# Patient Record
Sex: Male | Born: 1975 | Race: Asian | Hispanic: No | Marital: Single | Smoking: Former smoker
Health system: Southern US, Community
[De-identification: ages and names within clinical notes are randomized; demographics above are authoritative.]

## PROBLEM LIST (undated history)

## (undated) DIAGNOSIS — R0600 Dyspnea, unspecified: Secondary | ICD-10-CM

## (undated) DIAGNOSIS — B449 Aspergillosis, unspecified: Secondary | ICD-10-CM

## (undated) DIAGNOSIS — L814 Other melanin hyperpigmentation: Secondary | ICD-10-CM

## (undated) DIAGNOSIS — D649 Anemia, unspecified: Secondary | ICD-10-CM

## (undated) DIAGNOSIS — K219 Gastro-esophageal reflux disease without esophagitis: Secondary | ICD-10-CM

## (undated) DIAGNOSIS — J439 Emphysema, unspecified: Secondary | ICD-10-CM

## (undated) DIAGNOSIS — E039 Hypothyroidism, unspecified: Secondary | ICD-10-CM

## (undated) DIAGNOSIS — A310 Pulmonary mycobacterial infection: Secondary | ICD-10-CM

## (undated) DIAGNOSIS — R634 Abnormal weight loss: Principal | ICD-10-CM

## (undated) DIAGNOSIS — L568 Other specified acute skin changes due to ultraviolet radiation: Principal | ICD-10-CM

## (undated) DIAGNOSIS — L270 Generalized skin eruption due to drugs and medicaments taken internally: Secondary | ICD-10-CM

## (undated) DIAGNOSIS — Z77120 Contact with and (suspected) exposure to mold (toxic): Secondary | ICD-10-CM

## (undated) HISTORY — DX: Pulmonary mycobacterial infection: A31.0

## (undated) HISTORY — DX: Other specified acute skin changes due to ultraviolet radiation: L56.8

## (undated) HISTORY — DX: Aspergillosis, unspecified: B44.9

## (undated) HISTORY — DX: Hypothyroidism, unspecified: E03.9

## (undated) HISTORY — DX: Other melanin hyperpigmentation: L81.4

## (undated) HISTORY — DX: Abnormal weight loss: R63.4

## (undated) HISTORY — DX: Contact with and (suspected) exposure to mold (toxic): Z77.120

## (undated) HISTORY — DX: Generalized skin eruption due to drugs and medicaments taken internally: L27.0

## (undated) HISTORY — DX: Emphysema, unspecified: J43.9

## (undated) HISTORY — DX: Gastro-esophageal reflux disease without esophagitis: K21.9

---

## 2010-11-19 HISTORY — PX: OTHER SURGICAL HISTORY: SHX169

## 2010-12-16 ENCOUNTER — Emergency Department (INDEPENDENT_AMBULATORY_CARE_PROVIDER_SITE_OTHER)
Admission: EM | Admit: 2010-12-16 | Discharge: 2010-12-16 | Disposition: A | Discharge status: Other Acute Inpatient Hospital | Payer: BC Managed Care – PPO | Source: Home / Self Care | Admitting: Emergency Medicine

## 2010-12-16 ENCOUNTER — Inpatient Hospital Stay (HOSPITAL_COMMUNITY)
Admission: EM | Admit: 2010-12-16 | Discharge: 2010-12-27 | DRG: 415 | Disposition: A | Discharge status: Home / Self Care | Payer: BC Managed Care – PPO | Source: Other Acute Inpatient Hospital | Attending: Thoracic Surgery | Admitting: Thoracic Surgery

## 2010-12-16 DIAGNOSIS — D72829 Elevated white blood cell count, unspecified: Secondary | ICD-10-CM | POA: Diagnosis present

## 2010-12-16 DIAGNOSIS — Z5332 Thoracoscopic surgical procedure converted to open procedure: Secondary | ICD-10-CM

## 2010-12-16 DIAGNOSIS — Z23 Encounter for immunization: Secondary | ICD-10-CM

## 2010-12-16 DIAGNOSIS — J45909 Unspecified asthma, uncomplicated: Secondary | ICD-10-CM | POA: Diagnosis present

## 2010-12-16 DIAGNOSIS — B449 Aspergillosis, unspecified: Principal | ICD-10-CM | POA: Diagnosis present

## 2010-12-16 DIAGNOSIS — J439 Emphysema, unspecified: Secondary | ICD-10-CM | POA: Diagnosis present

## 2010-12-16 DIAGNOSIS — R042 Hemoptysis: Secondary | ICD-10-CM | POA: Diagnosis present

## 2010-12-16 DIAGNOSIS — K219 Gastro-esophageal reflux disease without esophagitis: Secondary | ICD-10-CM | POA: Diagnosis present

## 2010-12-16 DIAGNOSIS — D649 Anemia, unspecified: Secondary | ICD-10-CM | POA: Diagnosis present

## 2010-12-16 DIAGNOSIS — I959 Hypotension, unspecified: Secondary | ICD-10-CM | POA: Diagnosis not present

## 2010-12-16 DIAGNOSIS — Z79899 Other long term (current) drug therapy: Secondary | ICD-10-CM

## 2010-12-16 DIAGNOSIS — Z87891 Personal history of nicotine dependence: Secondary | ICD-10-CM

## 2010-12-16 DIAGNOSIS — E890 Postprocedural hypothyroidism: Secondary | ICD-10-CM | POA: Diagnosis present

## 2010-12-16 DIAGNOSIS — A310 Pulmonary mycobacterial infection: Secondary | ICD-10-CM | POA: Diagnosis present

## 2010-12-16 LAB — COMPREHENSIVE METABOLIC PANEL
ALT: 10 U/L (ref 0–53)
Alkaline Phosphatase: 146 U/L — ABNORMAL HIGH (ref 39–117)
BUN: 12 mg/dL (ref 6–23)
Chloride: 110 mEq/L (ref 96–112)
Glucose, Bld: 150 mg/dL — ABNORMAL HIGH (ref 70–99)
Potassium: 3.3 mEq/L — ABNORMAL LOW (ref 3.5–5.1)
Sodium: 141 mEq/L (ref 135–145)
Total Bilirubin: 0.7 mg/dL (ref 0.3–1.2)
Total Protein: 6.6 g/dL (ref 6.0–8.3)

## 2010-12-16 LAB — POCT I-STAT 3, ART BLOOD GAS (G3+)
O2 Saturation: 100 %
TCO2: 26 mmol/L (ref 0–100)
pCO2 arterial: 48.3 mmHg — ABNORMAL HIGH (ref 35.0–45.0)
pH, Arterial: 7.319 — ABNORMAL LOW (ref 7.350–7.450)
pO2, Arterial: 522 mmHg — ABNORMAL HIGH (ref 80.0–100.0)

## 2010-12-16 LAB — DIFFERENTIAL
Basophils Absolute: 0 10*3/uL (ref 0.0–0.1)
Eosinophils Relative: 1 % (ref 0–5)
Lymphocytes Relative: 9 % — ABNORMAL LOW (ref 12–46)
Monocytes Relative: 4 % (ref 3–12)
Neutrophils Relative %: 86 % — ABNORMAL HIGH (ref 43–77)

## 2010-12-16 LAB — CBC
HCT: 34 % — ABNORMAL LOW (ref 39.0–52.0)
Hemoglobin: 11.7 g/dL — ABNORMAL LOW (ref 13.0–17.0)
MCV: 82.3 fL (ref 78.0–100.0)
RBC: 4.13 MIL/uL — ABNORMAL LOW (ref 4.22–5.81)
WBC: 27.7 10*3/uL — ABNORMAL HIGH (ref 4.0–10.5)

## 2010-12-16 LAB — GLUCOSE, CAPILLARY: Glucose-Capillary: 111 mg/dL — ABNORMAL HIGH (ref 70–99)

## 2010-12-16 LAB — PROCALCITONIN: Procalcitonin: 0.26 ng/mL

## 2010-12-16 LAB — PROTIME-INR
INR: 1.13 (ref 0.00–1.49)
Prothrombin Time: 14.7 seconds (ref 11.6–15.2)

## 2010-12-16 LAB — POCT CARDIAC MARKERS
CKMB, poc: 1.4 ng/mL (ref 1.0–8.0)
Myoglobin, poc: 154 ng/mL (ref 12–200)

## 2010-12-17 LAB — URINALYSIS, ROUTINE W REFLEX MICROSCOPIC
Bilirubin Urine: NEGATIVE
Hgb urine dipstick: NEGATIVE
Ketones, ur: NEGATIVE mg/dL
Specific Gravity, Urine: 1.014 (ref 1.005–1.030)
Urobilinogen, UA: 1 mg/dL (ref 0.0–1.0)

## 2010-12-17 LAB — CARDIAC PANEL(CRET KIN+CKTOT+MB+TROPI)
CK, MB: 3.3 ng/mL (ref 0.3–4.0)
CK, MB: 3 ng/mL (ref 0.3–4.0)
Relative Index: 2.3 (ref 0.0–2.5)
Troponin I: 0.06 ng/mL (ref 0.00–0.06)
Troponin I: 0.03 ng/mL (ref 0.00–0.06)

## 2010-12-17 LAB — MRSA PCR SCREENING: MRSA by PCR: NEGATIVE

## 2010-12-17 LAB — DIFFERENTIAL
Basophils Absolute: 0 10*3/uL (ref 0.0–0.1)
Basophils Relative: 0 % (ref 0–1)
Eosinophils Absolute: 0.1 10*3/uL (ref 0.0–0.7)
Eosinophils Relative: 1 % (ref 0–5)
Monocytes Absolute: 0.7 10*3/uL (ref 0.1–1.0)

## 2010-12-17 LAB — CBC
HCT: 29.2 % — ABNORMAL LOW (ref 39.0–52.0)
MCHC: 32.9 g/dL (ref 30.0–36.0)
Platelets: 248 10*3/uL (ref 150–400)
RDW: 14.6 % (ref 11.5–15.5)
WBC: 11.1 10*3/uL — ABNORMAL HIGH (ref 4.0–10.5)

## 2010-12-17 LAB — POCT I-STAT 3, ART BLOOD GAS (G3+)
Acid-base deficit: 2 mmol/L (ref 0.0–2.0)
O2 Saturation: 96 %
Patient temperature: 97.7

## 2010-12-17 LAB — CORTISOL: Cortisol, Plasma: 14.1 ug/dL

## 2010-12-17 LAB — RETICULOCYTES: Retic Count, Absolute: 71.6 10*3/uL (ref 19.0–186.0)

## 2010-12-17 LAB — BASIC METABOLIC PANEL
Calcium: 7.5 mg/dL — ABNORMAL LOW (ref 8.4–10.5)
GFR calc Af Amer: >60 mL/min (ref >60)
GFR calc non Af Amer: >60 mL/min (ref >60)
Glucose, Bld: 94 mg/dL (ref 70–99)
Sodium: 138 mEq/L (ref 135–145)

## 2010-12-17 LAB — GLUCOSE, CAPILLARY: Glucose-Capillary: 101 mg/dL — ABNORMAL HIGH (ref 70–99)

## 2010-12-17 LAB — LEGIONELLA ANTIGEN, URINE: Legionella Antigen, Urine: NEGATIVE

## 2010-12-17 LAB — IRON AND TIBC
Iron: 44 ug/dL (ref 42–135)
TIBC: 162 ug/dL — ABNORMAL LOW (ref 215–435)

## 2010-12-17 LAB — EXPECTORATED SPUTUM ASSESSMENT W GRAM STAIN, RFLX TO RESP C

## 2010-12-17 LAB — BRAIN NATRIURETIC PEPTIDE: Pro B Natriuretic peptide (BNP): 79 pg/mL (ref 0.0–100.0)

## 2010-12-18 DIAGNOSIS — A318 Other mycobacterial infections: Secondary | ICD-10-CM

## 2010-12-18 DIAGNOSIS — J984 Other disorders of lung: Secondary | ICD-10-CM

## 2010-12-18 LAB — PHOSPHORUS: Phosphorus: 2.8 mg/dL (ref 2.3–4.6)

## 2010-12-18 LAB — FOLATE: Folate: 5.4 ng/mL

## 2010-12-18 LAB — CBC
HCT: 31.9 % — ABNORMAL LOW (ref 39.0–52.0)
Hemoglobin: 10.6 g/dL — ABNORMAL LOW (ref 13.0–17.0)
MCHC: 33.2 g/dL (ref 30.0–36.0)
RBC: 3.8 MIL/uL — ABNORMAL LOW (ref 4.22–5.81)
WBC: 9.4 10*3/uL (ref 4.0–10.5)

## 2010-12-18 LAB — VITAMIN B12: Vitamin B-12: 493 pg/mL (ref 211–911)

## 2010-12-18 LAB — DIFFERENTIAL
Basophils Absolute: 0 10*3/uL (ref 0.0–0.1)
Basophils Relative: 0 % (ref 0–1)
Lymphocytes Relative: 12 % (ref 12–46)
Neutro Abs: 7.4 10*3/uL (ref 1.7–7.7)
Neutrophils Relative %: 78 % — ABNORMAL HIGH (ref 43–77)

## 2010-12-18 LAB — URINE CULTURE

## 2010-12-18 LAB — BASIC METABOLIC PANEL
CO2: 26 mEq/L (ref 19–32)
Calcium: 8 mg/dL — ABNORMAL LOW (ref 8.4–10.5)
GFR calc Af Amer: >60 mL/min (ref >60)
Glucose, Bld: 96 mg/dL (ref 70–99)
Potassium: 3.8 mEq/L (ref 3.5–5.1)
Sodium: 138 mEq/L (ref 135–145)

## 2010-12-18 LAB — TSH: TSH: 0.021 u[IU]/mL — ABNORMAL LOW (ref 0.350–4.500)

## 2010-12-19 ENCOUNTER — Encounter: Payer: Self-pay | Admitting: Internal Medicine

## 2010-12-19 DIAGNOSIS — R042 Hemoptysis: Secondary | ICD-10-CM

## 2010-12-19 DIAGNOSIS — J984 Other disorders of lung: Secondary | ICD-10-CM

## 2010-12-19 DIAGNOSIS — A318 Other mycobacterial infections: Secondary | ICD-10-CM

## 2010-12-19 LAB — BASIC METABOLIC PANEL
BUN: 4 mg/dL — ABNORMAL LOW (ref 6–23)
Calcium: 7.9 mg/dL — ABNORMAL LOW (ref 8.4–10.5)
Creatinine, Ser: 0.82 mg/dL (ref 0.4–1.5)
GFR calc non Af Amer: >60 mL/min (ref >60)
Glucose, Bld: 100 mg/dL — ABNORMAL HIGH (ref 70–99)

## 2010-12-19 LAB — CBC
HCT: 31 % — ABNORMAL LOW (ref 39.0–52.0)
MCH: 28.1 pg (ref 26.0–34.0)
MCHC: 33.5 g/dL (ref 30.0–36.0)
MCV: 83.8 fL (ref 78.0–100.0)
RDW: 14.8 % (ref 11.5–15.5)

## 2010-12-19 LAB — CULTURE, RESPIRATORY W GRAM STAIN

## 2010-12-20 DIAGNOSIS — B449 Aspergillosis, unspecified: Secondary | ICD-10-CM

## 2010-12-20 DIAGNOSIS — A310 Pulmonary mycobacterial infection: Secondary | ICD-10-CM

## 2010-12-20 DIAGNOSIS — R042 Hemoptysis: Secondary | ICD-10-CM

## 2010-12-20 DIAGNOSIS — J449 Chronic obstructive pulmonary disease, unspecified: Secondary | ICD-10-CM

## 2010-12-20 LAB — BASIC METABOLIC PANEL
CO2: 25 mEq/L (ref 19–32)
Calcium: 8.3 mg/dL — ABNORMAL LOW (ref 8.4–10.5)
Creatinine, Ser: 0.76 mg/dL (ref 0.4–1.5)
GFR calc Af Amer: >60 mL/min (ref >60)
GFR calc non Af Amer: >60 mL/min (ref >60)
Glucose, Bld: 96 mg/dL (ref 70–99)

## 2010-12-20 LAB — CBC
MCH: 28 pg (ref 26.0–34.0)
MCHC: 33.2 g/dL (ref 30.0–36.0)
RDW: 15.2 % (ref 11.5–15.5)

## 2010-12-21 ENCOUNTER — Inpatient Hospital Stay (HOSPITAL_COMMUNITY): Payer: BC Managed Care – PPO

## 2010-12-21 LAB — COMPREHENSIVE METABOLIC PANEL
Albumin: 2.7 g/dL — ABNORMAL LOW (ref 3.5–5.2)
Alkaline Phosphatase: 89 U/L (ref 39–117)
BUN: 4 mg/dL — ABNORMAL LOW (ref 6–23)
GFR calc Af Amer: >60 mL/min (ref >60)
Potassium: 4.2 mEq/L (ref 3.5–5.1)
Total Protein: 7.4 g/dL (ref 6.0–8.3)

## 2010-12-21 LAB — APTT: aPTT: 41 seconds — ABNORMAL HIGH (ref 24–37)

## 2010-12-21 LAB — ABO/RH: ABO/RH(D): B POS

## 2010-12-21 LAB — CBC
MCH: 28.3 pg (ref 26.0–34.0)
Platelets: 348 10*3/uL (ref 150–400)
RBC: 4.03 MIL/uL — ABNORMAL LOW (ref 4.22–5.81)
WBC: 6.9 10*3/uL (ref 4.0–10.5)

## 2010-12-21 LAB — PROTIME-INR: INR: 1 (ref 0.00–1.49)

## 2010-12-22 ENCOUNTER — Inpatient Hospital Stay (HOSPITAL_COMMUNITY): Payer: BC Managed Care – PPO

## 2010-12-22 DIAGNOSIS — B449 Aspergillosis, unspecified: Secondary | ICD-10-CM

## 2010-12-22 LAB — CULTURE, BLOOD (ROUTINE X 2)
Culture  Setup Time: 201201282226
Culture: NO GROWTH

## 2010-12-22 LAB — BASIC METABOLIC PANEL
CO2: 26 mEq/L (ref 19–32)
Chloride: 107 mEq/L (ref 96–112)
GFR calc Af Amer: >60 mL/min (ref >60)
Sodium: 141 mEq/L (ref 135–145)

## 2010-12-22 LAB — CBC
Hemoglobin: 10.8 g/dL — ABNORMAL LOW (ref 13.0–17.0)
RBC: 3.89 MIL/uL — ABNORMAL LOW (ref 4.22–5.81)

## 2010-12-23 ENCOUNTER — Inpatient Hospital Stay (HOSPITAL_COMMUNITY): Payer: BC Managed Care – PPO

## 2010-12-23 LAB — POCT I-STAT 3, ART BLOOD GAS (G3+)
Bicarbonate: 28.5 mEq/L — ABNORMAL HIGH (ref 20.0–24.0)
TCO2: 30 mmol/L (ref 0–100)
pCO2 arterial: 46.6 mmHg — ABNORMAL HIGH (ref 35.0–45.0)
pH, Arterial: 7.393 (ref 7.350–7.450)

## 2010-12-23 LAB — CULTURE, BLOOD (SINGLE): Culture  Setup Time: 201201291712

## 2010-12-23 LAB — CBC
HCT: 30.9 % — ABNORMAL LOW (ref 39.0–52.0)
MCV: 84 fL (ref 78.0–100.0)
Platelets: 249 10*3/uL (ref 150–400)
RBC: 3.68 MIL/uL — ABNORMAL LOW (ref 4.22–5.81)
WBC: 11.6 10*3/uL — ABNORMAL HIGH (ref 4.0–10.5)

## 2010-12-23 LAB — CULTURE, BLOOD (ROUTINE X 2)
Culture  Setup Time: 201201291151
Culture: NO GROWTH
Culture: NO GROWTH

## 2010-12-23 LAB — BASIC METABOLIC PANEL
BUN: 4 mg/dL — ABNORMAL LOW (ref 6–23)
Chloride: 100 mEq/L (ref 96–112)
Glucose, Bld: 151 mg/dL — ABNORMAL HIGH (ref 70–99)
Potassium: 3.9 mEq/L (ref 3.5–5.1)

## 2010-12-24 ENCOUNTER — Inpatient Hospital Stay (HOSPITAL_COMMUNITY): Payer: BC Managed Care – PPO

## 2010-12-24 LAB — CROSSMATCH
Unit division: 0
Unit division: 0

## 2010-12-24 LAB — COMPREHENSIVE METABOLIC PANEL
Albumin: 2.2 g/dL — ABNORMAL LOW (ref 3.5–5.2)
BUN: 5 mg/dL — ABNORMAL LOW (ref 6–23)
Calcium: 8.1 mg/dL — ABNORMAL LOW (ref 8.4–10.5)
Creatinine, Ser: 0.78 mg/dL (ref 0.4–1.5)
Potassium: 4 mEq/L (ref 3.5–5.1)
Total Protein: 5.9 g/dL — ABNORMAL LOW (ref 6.0–8.3)

## 2010-12-24 LAB — CBC
HCT: 31.6 % — ABNORMAL LOW (ref 39.0–52.0)
MCH: 27.6 pg (ref 26.0–34.0)
MCV: 84.7 fL (ref 78.0–100.0)
Platelets: 249 10*3/uL (ref 150–400)
RBC: 3.73 MIL/uL — ABNORMAL LOW (ref 4.22–5.81)

## 2010-12-25 ENCOUNTER — Other Ambulatory Visit: Payer: Self-pay | Admitting: Thoracic Surgery

## 2010-12-25 ENCOUNTER — Inpatient Hospital Stay (HOSPITAL_COMMUNITY): Payer: BC Managed Care – PPO

## 2010-12-25 LAB — BASIC METABOLIC PANEL
Calcium: 8 mg/dL — ABNORMAL LOW (ref 8.4–10.5)
GFR calc non Af Amer: >60 mL/min (ref >60)
Potassium: 3.6 mEq/L (ref 3.5–5.1)
Sodium: 138 mEq/L (ref 135–145)

## 2010-12-25 LAB — POCT I-STAT 7, (LYTES, BLD GAS, ICA,H+H)
Acid-base deficit: 2 mmol/L (ref 0.0–2.0)
Calcium, Ion: 1.07 mmol/L — ABNORMAL LOW (ref 1.12–1.32)
HCT: 34 % — ABNORMAL LOW (ref 39.0–52.0)
Potassium: 4.4 mEq/L (ref 3.5–5.1)
pCO2 arterial: 52.6 mmHg — ABNORMAL HIGH (ref 35.0–45.0)
pO2, Arterial: 106 mmHg — ABNORMAL HIGH (ref 80.0–100.0)

## 2010-12-25 LAB — CBC
MCHC: 33.2 g/dL (ref 30.0–36.0)
Platelets: 239 10*3/uL (ref 150–400)
RDW: 14.9 % (ref 11.5–15.5)
WBC: 8.9 10*3/uL (ref 4.0–10.5)

## 2010-12-25 LAB — TISSUE CULTURE

## 2010-12-25 LAB — MRSA PCR SCREENING: MRSA by PCR: NEGATIVE

## 2010-12-26 ENCOUNTER — Inpatient Hospital Stay (HOSPITAL_COMMUNITY): Payer: BC Managed Care – PPO

## 2010-12-26 DIAGNOSIS — J984 Other disorders of lung: Secondary | ICD-10-CM

## 2010-12-26 DIAGNOSIS — A318 Other mycobacterial infections: Secondary | ICD-10-CM

## 2010-12-27 ENCOUNTER — Telehealth: Payer: Self-pay | Admitting: Internal Medicine

## 2010-12-27 ENCOUNTER — Inpatient Hospital Stay (HOSPITAL_COMMUNITY): Payer: BC Managed Care – PPO

## 2010-12-27 LAB — BASIC METABOLIC PANEL
BUN: 5 mg/dL — ABNORMAL LOW (ref 6–23)
CO2: 27 mEq/L (ref 19–32)
Calcium: 8.2 mg/dL — ABNORMAL LOW (ref 8.4–10.5)
Creatinine, Ser: 0.73 mg/dL (ref 0.4–1.5)
GFR calc non Af Amer: >60 mL/min (ref >60)
Glucose, Bld: 97 mg/dL (ref 70–99)
Sodium: 139 mEq/L (ref 135–145)

## 2010-12-27 LAB — CBC
HCT: 30.1 % — ABNORMAL LOW (ref 39.0–52.0)
Hemoglobin: 9.9 g/dL — ABNORMAL LOW (ref 13.0–17.0)
MCH: 28.1 pg (ref 26.0–34.0)
MCHC: 32.9 g/dL (ref 30.0–36.0)
RDW: 14.9 % (ref 11.5–15.5)

## 2010-12-28 ENCOUNTER — Telehealth (INDEPENDENT_AMBULATORY_CARE_PROVIDER_SITE_OTHER): Payer: Self-pay | Admitting: *Deleted

## 2010-12-28 ENCOUNTER — Telehealth: Payer: Self-pay | Admitting: Internal Medicine

## 2010-12-28 DIAGNOSIS — A310 Pulmonary mycobacterial infection: Secondary | ICD-10-CM | POA: Insufficient documentation

## 2010-12-29 ENCOUNTER — Telehealth (INDEPENDENT_AMBULATORY_CARE_PROVIDER_SITE_OTHER): Payer: Self-pay | Admitting: *Deleted

## 2010-12-29 ENCOUNTER — Encounter: Payer: Self-pay | Admitting: Infectious Disease

## 2010-12-29 DIAGNOSIS — A318 Other mycobacterial infections: Secondary | ICD-10-CM

## 2010-12-29 DIAGNOSIS — J439 Emphysema, unspecified: Secondary | ICD-10-CM

## 2010-12-29 DIAGNOSIS — B449 Aspergillosis, unspecified: Secondary | ICD-10-CM

## 2011-01-02 ENCOUNTER — Other Ambulatory Visit: Payer: Self-pay | Admitting: Thoracic Surgery

## 2011-01-02 DIAGNOSIS — D381 Neoplasm of uncertain behavior of trachea, bronchus and lung: Secondary | ICD-10-CM

## 2011-01-03 ENCOUNTER — Ambulatory Visit (INDEPENDENT_AMBULATORY_CARE_PROVIDER_SITE_OTHER): Payer: BC Managed Care – PPO | Admitting: Thoracic Surgery

## 2011-01-03 ENCOUNTER — Ambulatory Visit
Admission: RE | Admit: 2011-01-03 | Discharge: 2011-01-03 | Disposition: A | Discharge status: Home / Self Care | Payer: BC Managed Care – PPO | Source: Ambulatory Visit | Attending: Thoracic Surgery | Admitting: Thoracic Surgery

## 2011-01-03 DIAGNOSIS — D381 Neoplasm of uncertain behavior of trachea, bronchus and lung: Secondary | ICD-10-CM

## 2011-01-03 DIAGNOSIS — B449 Aspergillosis, unspecified: Secondary | ICD-10-CM

## 2011-01-03 NOTE — Assessment & Plan Note (Signed)
OFFICE VISIT  [name], [name] DOB:  21-Nov-1975                                        January 03, 2011 CHART #:  [id number]  The patient returns today.  His blood pressure is 118/79, pulse 100, respirations 20 and sats were 95%.  Chest x-ray showed normal postoperative changes.  There is an apical space which we would have expected.  He has finally gotten all his pain.  We gave him a refill for Percocet #20 and he finally got his antifungal medications.  I planned to see him back again in 3 weeks with a chest x-ray.  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  He has had no more hemoptysis.  Nicanor Alcon, M.D. Electronically Signed  DPB/MEDQ  D:  01/03/2011  T:  01/03/2011  Job:  [id number]

## 2011-01-04 NOTE — Progress Notes (Addendum)
Summary: ID appt  Phone Note Other Incoming Call back at [phone]   Caller: jolene carter Summary of Call: Wants to speak with nurse in ref to this pt says she spoke with Janett Billow R. earlier. Initial call taken by: Netta Neat,  December 28, 2010 4:26 PM  Follow-up for Phone Call        Jolene informed that MR spoke with Dr Megan Salon (ID) personaly and an appt will be set for this pt ASAP. Pt is to continue on all other meds until seen with ID. Francesca Jewett [facility]  December 28, 2010 4:50 PM     Appended Document: Orders Update I have arranged appt with myself in ID. THe patient's dc was not handles properly as he was dc tohome but his insurance will not pay for her voriconazole despite the fact that they just payed for resection of his aspergilloma. 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) Added new problem of ASPERGILLOSIS (ICD-117.3) Medications: Added new medication of VORICONAZOLE 200 MG TABS (VORICONAZOLE) take one and ah have tablets twice daily and then one tablet twice daily - Signed Rx of VORICONAZOLE 200 MG TABS (VORICONAZOLE) take one and ah have tablets twice daily and then one tablet twice daily;  #61 x 11;  Signed;  Entered by: Alcide Evener MD;  Authorized by: Rhina Brackett Dam MD;  Method used: Print then Give to Patient    Prescriptions: VORICONAZOLE 200 MG TABS (VORICONAZOLE) take one and ah have tablets twice daily and then one tablet twice daily  #61 x 11   Entered and Authorized by:   Alcide Evener MD   Signed by:   Rhina Brackett Dam MD on 12/28/2010   Method used:   Print then Give to Patient   RxID:   [id number]

## 2011-01-04 NOTE — Progress Notes (Signed)
Summary: needs fu appt  Phone Note Outgoing Call   Summary of Call: [name], Patient is being discharged today by DR Arlyce Dice. He has fu appt with Dr Elsworth Soho but because I wsa originally involved with his care he has agreed to swtich to me for fu. Dr. Elsworth Soho is oka with this. Please call (currently in [phone] at [facility]) and change fu to me at [facility] Initial call taken by: Brand Males MD,  December 27, 2010 1:19 PM  Follow-up for Phone Call        pt set to see MR 01-16-11. OV with RA cancelled. [place] Bing CMA  December 28, 2010 1:18 PM

## 2011-01-04 NOTE — Op Note (Signed)
NAMECOLEN, [name]                   ACCOUNT NO.:  [id number]  MEDICAL RECORD NO.:  [id number]           PATIENT TYPE:  I  LOCATION:  2309                         FACILITY:  [facility]  PHYSICIAN:  Nicanor Alcon, M.D. 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  OPERATION PERFORMED:  Left VATS, left thoracotomy, and resection of the apical posterior segment left upper lobe with resection of mycetoma.  SURGEON:  Nicanor Alcon, M.D.  FIRST ASSISTANT:  Gilford Raid, M.D.  ANESTHESIA:  General anesthesia.  INDICATIONS:  This 35 year old [place] had Mycobacterium avium intracellulare with cavitary lesions in his left upper lobe and his right upper lobe.  In the left upper lobe, there was a large mycetoma which was thought to be an aspergilloma.  He had recurrent hemoptysis. He was brought to the operating room for resection.  Risks of procedure were explained to the patient.  The patient understand the risks.  PROCEDURE IN DETAIL:  He was turned to the left lateral thoracotomy position and was prepped and draped in the usual sterile manner.  The dual-lumen tube was inserted and the left lung was deflated.  Two trocar sites were made in the anterior and posterior axillary line at the seventh intercostal space.  Two trocars were inserted and the left lower lobe was fairly free, and after getting the trocars in at the superior segment of the left lower lobe and starting at the fissure and going superiorly, it was stuck with very dense adhesions such that we could just take a few of them down with electrocautery and  had to end up converting to an open thoracotomy.  A posterolateral thoracotomy incision was made in the fifth intercostal space.  Latissimus was divided.  The serratus was reflected anteriorly.  A portion of the rhomboid was evaluated posteriorly.  The fifth intercostal space was entered, and then we started taking down adhesions superiorly and medially.  Medially, there were some adhesions, but they came down easily.  Posteriorly and superiorly, there were very dense adhesions where the mycetoma was.  We started dissecting out the lateral wall superiorly and then medially.  We were able to get a plane immediately and dissected down medially and decided to go ahead and open the mycetoma, because it was so dense, there was no way we could not do that; so we opened the mycetoma, cleaned it out, sent it for culture and then with electrocautery, we took down the mycetoma superiorly and then identified the aorta and took the left upper lobe off the aorta and then laterally off the chest wall.  We also had to take a superior segment off the chest wall, dissecting it down.  Finally, we were able to free up the left lower lobe and the lingular portion of the superior segment. The apical posterior segment had pretty much  been dissected out, and then we resected that using the sutures.  Using the 45 purple stapler with multiple applications were required to dissect both the medial and lateral wall of the mycetoma and down to the base.  We then obliterated the whole thing by using horizontal mattress 2-0 Prolene to completely obliterate the two staple lines.  This required about six to seven of these sutures to do this.  We checked for air leaks and sutured two more with 3-0 Vicryl.  We then put ProGel to the staple line, and I put two chest tubes in through the trocar sites.  All bleeding was coagulated with electrocautery.  The patient received 2 units of blood.  The chest tubes were sutured in  place with 0 silk.  Single On-Q was inserted. Marcaine block was done.  The patient was returned to the recovery room stable, in serious condition.     Nicanor Alcon, M.D.     DPB/MEDQ  D:  12/22/2010  T:  12/23/2010  Job:  [id number]  Electronically Signed by Ethelda Chick M.D. on 01/04/2011 03:39:18 PM

## 2011-01-04 NOTE — H&P (Signed)
  [name], [name]                   ACCOUNT NO.:  [id number]  MEDICAL RECORD NO.:  [id number]           PATIENT TYPE:  LOCATION:                                 FACILITY:  PHYSICIAN:  Nicanor Alcon, M.D. DATE OF BIRTH:  [birth year]  DATE OF ADMISSION: DATE OF DISCHARGE:                             HISTORY & PHYSICAL   CHIEF COMPLAINT:  Hemoptysis.  HISTORY OF PRESENT ILLNESS:  This 35 year old [place] male has been treated with severe chronic obstructive pulmonary disease with Mycobacterium avium-intracellulare presented with hemoptysis.  He has two right upper lobe and left upper lobe cavitary lesions and in the left upper lobe, there is what appears to be a mycetoma, probably secondary to aspergillosis.  His hemoptysis stopped.  His pulmonary function tests showed an FVC of 1.5 that goes to 1.8 with dilators and FVC of 2.83.  PAST MEDICAL HISTORY:  He has got GI reflux, Graves disease, hypothyroidism, previous hemoptysis, weight loss.  He has had tooth extractions.  ALLERGIES:  He has got none.  MEDICATIONS:  Vancomycin, rifampin, voriconazole, ethambutol, levothyroxine, Protonix, Biaxin, Zosyn, Symbicort, morphine, __________, Ventolin, Atrovent, Tylenol, Tessalon Perles.  FAMILY HISTORY:  He is adopted.  SOCIAL HISTORY:  He quit smoking in 2004 and no alcohol use.  REVIEW OF SYSTEMS:  See history of present illness.  No hemoptysis.  No kidney disease.  No arthritis.  No other findings.  PHYSICAL EXAMINATION:  VITAL SIGNS:  His blood pressure is 120/60, pulse 80, respirations 18, sats were 94%. HEAD, EYES, EARS, NOSE, AND THROAT:  Unremarkable. NECK:  Supple without thyromegaly. CHEST:  There are some bilateral wheezes. HEART:  Regular sinus rhythm.  No murmurs. ABDOMEN:  Soft.  There is no hepatosplenomegaly. EXTREMITIES:  Pulses 2+.  There is no clubbing or edema. NEUROLOGIC:  He is oriented x3.  Sensory and motor intact.  Cranial nerves  intact.  IMPRESSION: 1. Left upper lobe cavitary mass, probably causing hemoptysis with     aspergilloma. 2. Esophageal reflux. 3. Mycobacterium avium-intracellulare. 4. 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     Nicanor Alcon, M.D.     DPB/MEDQ  D:  12/19/2010  T:  12/20/2010  Job:  [id number]  Electronically Signed by Ethelda Chick M.D. on 01/04/2011 03:39:07 PM

## 2011-01-04 NOTE — Progress Notes (Addendum)
Summary: RX faxed to [facility]  Phone Note Outgoing Call   Call placed by: Kennyth Lose Summary of Call: RX for Voriconazole called to pts. mail order pharmacy [facility] at [phone] Initial call taken by: Myrtis Hopping CMA Deborra Medina),  December 29, 2010 3:14 PM     Appended Document: RX faxed to [facility] called to verify dosing of newest med. verified with pcp & relayed this to [facility]

## 2011-01-04 NOTE — Progress Notes (Signed)
Summary: Prescription  Phone Note Call from Patient Call back at Home Phone [phone] Call back at [phone]   Caller: Patient Summary of Call: Dr Chase Caller and Dr. Elsworth Soho saw patient in hospital.  Patient is having difficulty obtaining doritonazole.  Not available in [place].  Has tried specialty drug store which told him that they could not get drug.  Where does he need to go to fill prescription or can it be substituted? [facility] in [place]- [address]  Initial call taken by: Jacqualine Code,  December 28, 2010 8:25 AM  Follow-up for Phone Call         Pt is having diff with getting rx for voriconazole.  He states that the pharmacies that he has been speaking with do not have this med and if they order it, this rx will cost pt 2000$! Can we switch to something else? And also, is it okay to go ahead and cancel his HFU with you and sched him to see MR.  See PN dated 12/27/10. Thanks!  Jolene, Dr. Lorelei Pont nurse, also called regarding this message and wanted to know if the med can be switched to something cheaper.  Please advise.  Also need to know how long pt is to be on med.  Jolene stated Dr. Arlyce Dice wanted pt on med x 1 year but pt is stating he is only supposed to be on med x 2 weeks.  Please advise.  thank you.  Jolene can be reached at [place] LPN  December 28, 402 11:15 AM   Additional Follow-up for Phone Call Additional follow up Details #1::        In talking to Dr. Arlyce Dice yesterday apparently Dr. Elsworth Soho recommended it 2 weeks but Dr Arlyce Dice thought patient should be on it for 1 year.  Inf [facility] Dr. Michel Bickers was the one who started it. Therefore, just now I spoke to DR. Megan Salon and informed of above and patient desire to follow with ID here in [place]. HE took down patient phone number and said would call patient. He said a) he will set up  ID fu here; b) get patient enroleld into assistance pgm; c) patient will need voricanazle for many months or a year; d)  patient will have to wait to hear from ID  Please communicate above to patient please ensure patient has ID fu with Dr. Wendie Agreste or Dr. Megan Salon here Additional Follow-up by: [name],  December 28, 2010 12:07 PM  New Problems: BACTEREMIA, [place] (ICD-031.2)   Additional Follow-up for Phone Call Additional follow up Details #2::    Spoke with pt and he is aware that he will need appt with ID. I have placed referral. Pt also set to see MR on 01-16-11 for HFU. [place] Bing CMA  December 28, 2010 1:16 PM   New Problems: BACTEREMIA, MYCOBACTERIUM AVIUM COMPLEX (ICD-031.2)

## 2011-01-04 NOTE — Assessment & Plan Note (Signed)
Summary: Office Visit (Infectious Disease)    Visit Type:  Follow-up   History of Present Illness: 35 year old man with bullous lung disease being treated by Dr. Garnette Scheuermann in [place] for MAI infection who developed hemoptysis and was found to ahve an aspergilloma (he also had grew it from sputum cuulturs form [place] office). He is status post resection of left upper lobe wedge which grew aspergillus.He was rx voriconazole but his insurance co would apparently not pay fo rthis initially. THis visit was largely to get him rx that could be faxed to his mail order pharmacy and another one he could take to [facility] to get 7 day supply. He is till taking his ETHambutol and clarithromycin for MAI. He is doing well post surgeyr and has no complaints  Past History:  Past Medical History: aspergillloma MAI lung disease Bullous lung disease  Family History: noncontributory  Social History: fomer smoker  Review of Systems       see HPI otherwise negative on 12 pt review  Physical Exam  General:  alert and underweight appearing.   Head:  normocephalic and atraumatic.   Eyes:  vision grossly intact.   Nose:  no external deformity.   Mouth:  good dentition and pharynx pink and moist.   Neck:  supple.   Lungs:  normal respiratory effort, no crackles, and no wheezes.   Heart:  normal rate, regular rhythm, and no murmur.   Abdomen:  soft, non-tender, and normal bowel sounds.   Msk:  normal ROM and no joint tenderness.   Neurologic:  alert & oriented X3, cranial nerves II-XII intact, and gait normal.   Skin:  surgical scars cdi Psych:  Oriented X3, memory intact for recent and remote, and normally interactive.     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 will  continue the [facility] and clarithro for now holdin goff on rifabutin for now  Orders: Est. Patient Level IV (91638)  Problem # 3:  EMPHYSEMATOUS BLEB (ICD-492.0)  ? secondary to smoking seems most likely rather than effect of MAI  Orders: Est. Patient Level IV (46659) Prescriptions: VORICONAZOLE 200 MG TABS (VORICONAZOLE) take one and ah have tablets twice daily and then one tablet twice daily  #14 x 0   Entered and Authorized by:   Alcide Evener MD   Signed by:   Rhina Brackett Dam MD on 12/29/2010   Method used:   Print then Give to Patient   RxID:   [id number] VORICONAZOLE 200 MG TABS (VORICONAZOLE) take one and ah have tablets twice daily and then one tablet twice daily  #60 x 114   Entered and Authorized by:   Alcide Evener MD   Signed by:   Rhina Brackett Dam MD on 12/29/2010   Method used:   Print then Give to Patient   RxID:   [id number] VORICONAZOLE 200 MG TABS (VORICONAZOLE) take one and ah have tablets twice daily and then one tablet twice daily  #60 x 11   Entered and Authorized by:   Alcide Evener MD   Signed by:   Rhina Brackett Dam MD on 12/29/2010   Method used:   Print then Give to Patient   RxID:   [id number] VORICONAZOLE 200 MG TABS (VORICONAZOLE) take one and  ah have tablets twice daily and then one tablet twice daily  #7 x 11   Entered and Authorized by:   Alcide Evener MD   Signed by:   Rhina Brackett Dam MD on 12/29/2010   Method used:   Print then Give to Patient   RxID:   [id number] VORICONAZOLE 200 MG TABS (VORICONAZOLE) take one and ah have tablets twice daily and then one tablet twice daily  #7 x 11   Entered and Authorized by:   Alcide Evener MD   Signed by:   Rhina Brackett Dam MD on 12/29/2010   Method used:   Faxed to ...       [facility] (retail)       [address] [place], [place]  [address]       Ph: [phone]       Fax: [phone]   RxID:    [id number]   Appended Document: Orders Update    Clinical Lists Changes  Orders: Added new Test order of T- * Misc. Laboratory test [phone]) - Signed

## 2011-01-15 ENCOUNTER — Other Ambulatory Visit (INDEPENDENT_AMBULATORY_CARE_PROVIDER_SITE_OTHER): Payer: BC Managed Care – PPO

## 2011-01-15 ENCOUNTER — Encounter: Payer: Self-pay | Admitting: Infectious Disease

## 2011-01-15 DIAGNOSIS — B449 Aspergillosis, unspecified: Secondary | ICD-10-CM

## 2011-01-16 ENCOUNTER — Ambulatory Visit: Payer: Self-pay | Admitting: Pulmonary Disease

## 2011-01-16 ENCOUNTER — Encounter: Payer: Self-pay | Admitting: Internal Medicine

## 2011-01-16 ENCOUNTER — Inpatient Hospital Stay (INDEPENDENT_AMBULATORY_CARE_PROVIDER_SITE_OTHER): Payer: BC Managed Care – PPO | Admitting: Internal Medicine

## 2011-01-16 DIAGNOSIS — J449 Chronic obstructive pulmonary disease, unspecified: Secondary | ICD-10-CM

## 2011-01-16 DIAGNOSIS — B449 Aspergillosis, unspecified: Secondary | ICD-10-CM

## 2011-01-16 DIAGNOSIS — A318 Other mycobacterial infections: Secondary | ICD-10-CM

## 2011-01-21 NOTE — Discharge Summary (Signed)
[name], [name]                   ACCOUNT NO.:  [id number]  MEDICAL RECORD NO.:  [id number]           PATIENT TYPE:  I  LOCATION:  6440                         FACILITY:  [facility]  PHYSICIAN:  Nicanor Alcon, M.D. DATE OF BIRTH:  May 17, 1976  DATE OF ADMISSION:  12/16/2010 DATE OF DISCHARGE:  12/27/2010                              DISCHARGE SUMMARY   HISTORY:  The patient is a 35 year old [place] male with a significant history of COPD and Mycobacterium avium complex pneumonia who has been treated with triple antibiotic therapy including ethambutol, clarithromycin, and rifampin without improvement of his symptoms.  The patient has been hospitalized at [facility] in the past for this and has had episodes of hemoptysis.  He presented this hospitalization with worsening dyspnea as well as hemoptysis.  He was recently hospitalized at [facility] from December 08, 2010, to December 10, 2010, with hemoptysis and was continued on his therapy but also started a 10-day course of levofloxacin.  On the date of presentation, he had worsening shortness of breath and hemoptysis associated with cough.  He also complained of chest discomfort and weakness and therefore presented to the emergency department.  The patient was felt to require admission for further evaluation and treatment.  Past medical history includes as above.  Additionally, gastroesophageal reflux, history of Graves disease, hypothyroidism, status post ablation therapy.  PHYSICAL EXAMINATION:  Please see the history and physical.  Medications prior to admission included: 1. Levothyroxine 137 mcg daily. 2. Rifampin 300 mg 2 tablets daily.. 3. Ethambutol 1000 mg daily. 4. Ferrous sulfate 325 mg daily.  FAMILY HISTORY:  He is adopted.  SOCIAL HISTORY:  He quit smoking in 2004.  There is no alcohol abuse.  ALLERGIES:  None.  HOSPITAL COURSE:  The patient was admitted with pneumonia MAC and placed on BiPAP with  improvement.  He was also started on vancomycin and Zosyn and managed by the Critical Care Pulmonology Department.  A CT of the chest was obtained and showed severe bullous changes involving the lung apices bilaterally with a presumed mycetoma in one of the large posterior blebs of the left apex.  There was also marked pleural thickening but no pleural effusions.  There were numeral bilateral pulmonary parenchymal nodules throughout both lungs consistent with the history of mycobacterial infection.  This was felt to indicate active infection.  There was also evidence of bilateral upper lobe bronchiectasis.  The patient was felt to require thoracic surgical opinion and this was obtained with D. Marlyn Corporal, MD.  His initial impression was a large left upper lobe cavitary mass, probably causing the hemoptysis and probable aspergilloma.  Due to this finding, surgical intervention was felt to be warranted, and on December 22, 2010, he underwent a left VATS thoracotomy and left upper lobe apical posterior segmentectomy.  Repeat cultures were obtained at that time. Additionally, the contents of the abscess was also sent.  Findings on culture were consistent with aspergillosis.  He has been started on VFEND.  He has been followed up by Infectious Disease who feel as though he will probably require the  VFEND oral therapy for 1 year at least.  He is also to continue on his clarithromycin and ethambutol.  Clinically at this time, he is doing quite well.  Oxygen has been weaned and he maintains good saturations on room air.  His incision is healing well without signs of infection.  He is tolerating routine activities using standard protocols with gradual improvement.  Overall, the patient's status is felt to be tentatively stable for discharge on today's date or possibly tomorrow after reevaluation by Dr. Arlyce Dice and the other physicians involved.  Medications on discharge at the time of this  dictation include the following: 1. Symbicort 2 puffs twice daily. 2. Biaxin 500 mg twice daily. 3. Levo for an additional 6 months. 4. Synthroid 50 mcg daily for pain. 5. Oxycodone 5/325 mg 1-2 every 4 hours as needed. 6. Voriconazole 200 mg by mouth every 12 hours. 7. Ethambutol 400 mg 2.5 tablets daily. 8. Ferrous sulfate 325 mg daily.  He has not been restarted back on rifampin at this time..  FOLLOWUP:  The patient will see Dr. Elsworth Soho on January 16, 2011, at 2:45 p.m.  Additionally, he will see Dr. Arlyce Dice on January 03, 2011, at 2 p.m. with a chest x-ray at that time.  Additionally, we will make arrangements for him to have Infectious Disease follow up.  FINAL DIAGNOSES:  Severe Mycobacteria avium-intracellulare pneumonia, also bullous emphysema with aspergilloma status post resection of left upper lobe apical posterior segment.  Other diagnoses include gastroesophageal reflux, history of Graves disease with secondary hypothyroidism.  INSTRUCTIONS:  The patient received written instructions in regard to medications, activity, diet, wound care, and follow up.     John Giovanni, P.A.-C.   ______________________________ Nicanor Alcon, M.D.    Loren Racer  D:  12/27/2010  T:  12/27/2010  Job:  [id number]  cc:   Rigoberto Noel, MD Alcide Evener, MD  Electronically Signed by Jadene Pierini P.A.-C. on 01/16/2011 10:33:00 AM Electronically Signed by Ethelda Chick M.D. on 01/21/2011 04:28:52 PM

## 2011-01-22 ENCOUNTER — Other Ambulatory Visit: Payer: Self-pay | Admitting: Thoracic Surgery

## 2011-01-22 DIAGNOSIS — D381 Neoplasm of uncertain behavior of trachea, bronchus and lung: Secondary | ICD-10-CM

## 2011-01-23 ENCOUNTER — Ambulatory Visit (INDEPENDENT_AMBULATORY_CARE_PROVIDER_SITE_OTHER): Payer: Self-pay | Admitting: Thoracic Surgery

## 2011-01-23 ENCOUNTER — Ambulatory Visit
Admission: RE | Admit: 2011-01-23 | Discharge: 2011-01-23 | Disposition: A | Discharge status: Home / Self Care | Payer: BC Managed Care – PPO | Source: Ambulatory Visit | Attending: Thoracic Surgery | Admitting: Thoracic Surgery

## 2011-01-23 DIAGNOSIS — D381 Neoplasm of uncertain behavior of trachea, bronchus and lung: Secondary | ICD-10-CM

## 2011-01-23 DIAGNOSIS — J8410 Pulmonary fibrosis, unspecified: Secondary | ICD-10-CM

## 2011-01-23 LAB — FUNGUS CULTURE W SMEAR

## 2011-01-24 NOTE — Assessment & Plan Note (Signed)
OFFICE VISIT  [name], [name] DOB:  [birth year]                                        January 23, 2011 CHART #:  [id number]  HISTORY:  The patient is a 35 year old [place] male who was recently hospitalized and underwent left video-assisted thoracoscopy with left thoracotomy and resection of an apical posterior segment left upper lobe with resection of mycetoma.  Pathological findings were consistent with mycetoma.  Other findings were notable for acute and chronic pneumonitis and abscess formation with granulomatous inflammation and necrosis.  He is being managed from an Infectious Disease viewpoint currently by Dr. Tommy Medal.  On today's day, he reports that in general he is slowly feeling stronger.  He does have some dyspnea on exertion, but this is improving with time.  He is gradually increasing activities and tolerating them better.  He has had no recent fevers, chills or other constitutional symptoms.  Chest x-ray was obtained on today's date.  He was reviewed per Dr. Arlyce Dice.  He feels to be fairly stable in appearance with improving inflammatory response.  PHYSICAL EXAMINATION:  Vital Signs:  Blood pressure is 125/88, pulse is 105, respirations 14, oxygen saturation is 98% at rest on room air. General Appearance:  This is a well-developed adult male, in no acute distress.  Pulmonary:  Clear breath sounds bilaterally.  Cardiac:Regular rate and rhythm.  Incisions are inspected healing well.  He does have one chest tube site that has a superficial dehiscence.  There is no evidence of drainage.  There is no evidence of a cellulitis.  ASSESSMENT:  The patient was making continued ongoing recovery from his surgical resection for mycetoma.  He continues his antibiotics chronically under the care and management of Dr. Tommy Medal with Infectious Disease.  We will continue to follow him with next planned appointment to be in 6 weeks with a chest x-ray at that time.   Meanwhile, he is anxious to get back to work and we have told that he can begin on a part- time basis starting on the 19th of this month and his work apparently is willing to work with him very generously in regard to how quickly he returns to full activities.  John Giovanni, P.A.-C.  [name]  D:  01/23/2011  T:  01/24/2011  Job:  [id number]  cc:   Nicanor Alcon, M.D. Rigoberto Noel, MD Alcide Evener, MD

## 2011-01-25 NOTE — Assessment & Plan Note (Signed)
Summary: hfu//jrc   Visit Type:  Follow-up Primary Provider/Referring Provider:  Dr Arlyce Dice - CVTS, [name] - Pulmonary., Dr Wendie Agreste - ID  CC:  HFU. 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 On MAC Rx since 2010 and voricanazole since Jan 2012. PFTs jan 2012 - showed Severe Obstruction   January 16, 2011. This is post hospital fu for above after dc on 12/27/2010. Very dyspneic at dicscharge even for minor activities, now getting better. Still with class 2- 3 dyspnea. Did desaturate after walking 155 feeet x 3 laps in office. Wants to know how much more he can improve. No hemoptysis since surgery - longest spell wihtout hemoptysis. Has open wound at site of former chest tube.  Compliant with voricaazole and MAC Rx. Wants to know how long MAC Rx. Wants to know if hhe has to keep [facility] appt for MAc. Has now transferred care from [facility] to [facility]. No new issues.    Preventive Screening-Counseling & Management  Alcohol-Tobacco     Smoking Status: quit > 6 months     Smoking Cessation Counseling: no     Smoke Cessation Stage: quit     Packs/Day: 0.75     Year Started: 1990     Year Quit: 2009     Pack years: 15  Current Medications (verified): 1)  Voriconazole 200 Mg Tabs (Voriconazole) .... Take One and Ah Have Tablets Twice Daily and Then One Tablet Twice Daily 2)  Symbicort 80-4.5 Mcg/act Aero (Budesonide-Formoterol Fumarate) .... 2 Puffs Twice Daily 3)  Biaxin 500 Mg Tabs (Clarithromycin) .... Take 1 Tablet By Mouth Two Times A Day 4)  Synthroid 50 Mcg Tabs (Levothyroxine Sodium) .... Take 1 Tablet By Mouth Once A Day 5)  Ethambutol Hcl 400 Mg Tabs (Ethambutol Hcl) .... 2.5 Tablets Daily 6)  Ferro-Bob 325 (65 Fe) Mg Tabs (Ferrous Sulfate) .... Take 1 Tablet By Mouth Once A Day  Past History:  Past medical, surgical, family and social histories  (including risk factors) reviewed, and no changes noted (except as noted below).  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   Hypothyroidism secondary to ablation for Graves disease  Past Surgical History: Left VATS thoracotomy and LUL apical posterior segmentectomy 2012  Family History: Reviewed history from 12/29/2010 and no changes required. noncontributory adopted  Social History: Reviewed history from 12/29/2010 and no changes required. Quit in 2009, started in 1990, avg 2/3 of pack per day Works 2 jobs  Single   adoptedSmoking Status:  quit > 6 months Packs/Day:  0.75 Pack years:  15  Review of Systems       The patient complains of shortness of breath with activity.  The patient denies productive cough, non-productive cough, coughing up blood, chest pain, irregular heartbeats, acid heartburn, indigestion, loss of appetite, weight change, abdominal pain, difficulty swallowing, sore throat, tooth/dental problems, headaches, nasal congestion/difficulty breathing through nose, sneezing, itching, ear ache, anxiety, depression, hand/feet swelling, joint stiffness or pain, rash, change in color of mucus, and fever.    Vital Signs:  Patient profile:   35 year old male Height:      68 inches Weight:      133.50 pounds BMI:     20.37 O2 Sat:      98 % on Room air Pulse rate:   98 / minute BP sitting:  100 / 70  (right arm) Cuff size:   regular  Vitals Entered By: [place] Bing CMA (January 16, 2011 2:47 PM)  O2 Flow:  Room air CC: HFU.  Comments Medications reviewed with patient [place] Bing CMA  January 16, 2011 2:55 PM Daytime phone number verified with patient.    Physical Exam  General:  well developed, well nourished, in no acute distressthin.   Head:  normocephalic and atraumatic Eyes:  PERRLA/EOM intact; conjunctiva and sclera clear Ears:  TMs intact and clear with normal canals Nose:  no  deformity, discharge, inflammation, or lesions Mouth:  no deformity or lesions Neck:  no masses, thyromegaly, or abnormal cervical nodes Chest Wall:  scar from LUL segmentectomy + one wound healing by secondary intention Lungs:  sqeals in LUL area otehrwise noral Heart:  regular rate and rhythm, S1, S2 without murmurs, rubs, gallops, or clicks Abdomen:  bowel sounds positive; abdomen soft and non-tender without masses, or organomegaly Msk:  no deformity or scoliosis noted with normal posture Pulses:  pulses normal Extremities:  no clubbing, cyanosis, edema, or deformity noted Neurologic:  CN II-XII grossly intact with normal reflexes, coordination, muscle strength and tone Skin:  intact without lesions or rashes Cervical Nodes:  no significant adenopathy Axillary Nodes:  no significant adenopathy Psych:  alert and cooperative; normal mood and affect; normal attention span and concentration   Impression & Recommendations:  Problem # 1:  COPD (ICD-496) Assessment Improved this is due to MAC and secondary destruction. 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 No hemoptysis since  plan cont voricanzole per ID monitor at risk for develioping samine in RUL Orders: Rehabilitation Referral (Rehab) Est. 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 Will flag Dr. Wendie Agreste. Also, wants to cancel [facility] visit if Dr Wendie Agreste is ok with it Orders: Rehabilitation Referral (Rehab) Est. Patient Level III (09811)  Medications  Added to Medication List This Visit: 1)  Symbicort 80-4.5 Mcg/act Aero (Budesonide-formoterol fumarate) .... 2 puffs twice daily 2)  Biaxin 500 Mg Tabs (Clarithromycin) .... Take 1 tablet by mouth two times a day 3)  Synthroid 50 Mcg Tabs (Levothyroxine sodium) .... Take 1 tablet by mouth once a day 4)  Ethambutol Hcl 400 Mg Tabs (Ethambutol hcl) .... 2.5 tablets daily 5)  Ferro-bob 325 (65 Fe) Mg Tabs (Ferrous sulfate) .... Take 1 tablet by mouth once a day 6)  Spiriva Handihaler 18 Mcg Caps (Tiotropium bromide monohydrate) .... One puffs in handihaler daily  Other Orders: Prescription Created Electronically [id number]) HFA Instruction [phone])  Patient Instructions: 1)  please start spiriva 2)   - take discunt card and some samples 3)   - learn and show technique 4)  continue symbicort 5)   - sshow technique 6)  start pulm rehab at [facility] 7)  return in 6 weeks to rpeort progress 8)  you can do light work 9)  call or come sooner if there are problems 10)  I will check with Dr. Wendie Agreste about going to [facility] or how long  MAC treatment Prescriptions: SPIRIVA HANDIHALER 18 MCG  CAPS (TIOTROPIUM BROMIDE MONOHYDRATE) one puffs in handihaler daily  #1 x 6   Entered and Authorized by:   Brand Males MD   Signed by:   Brand Males MD on 01/16/2011   Method used:   Electronically to        [facility] # [phone]* (retail)       [address]       [place], [place]  [address]       Ph: [phone]       Fax: [phone]   RxID:   [id number]    Immunization History:  Influenza Immunization History:    Influenza:  historical (09/19/2010)  Pneumovax Immunization History:    Pneumovax:  historical (12/17/2010) .Ambulatory Pulse Oximetry  Resting; HR__100   ___    02 Sat_96_  Lap1 (185 feet)   HR__111___   02 Sat___92__ Lap2 (185 feet)   HR_120____   02 Sat___91__    Lap3 (185 feet)   HR__113___   02 Sat88_____  _x__Test Completed without  Difficulty  pt recovered while sitting to 99ra  hr 109 within minutes .Verdie Mosher [employer]  January 16, 2011 3:51 PM  ___Test Stopped due to:    Appended Document: hfu//jrc please call patient and tell him that I spoke to Dr. Wendie Agreste face to face and   a) Dr Erlene Senters DAmm wants him to cntinue the [facility] Rx and will discuss at ov with him about it in detail. Typically Rx duration is 2  years or so per Dr Wendie Agreste  b) cancel [facility] opinion  Appended Document: hfu//jrc pt aware to continue MAC rx and no need for [facility] appt. Pt will call and cancel.

## 2011-01-26 ENCOUNTER — Encounter: Payer: Self-pay | Admitting: *Deleted

## 2011-01-31 ENCOUNTER — Encounter: Payer: Self-pay | Admitting: Infectious Disease

## 2011-01-31 ENCOUNTER — Ambulatory Visit (INDEPENDENT_AMBULATORY_CARE_PROVIDER_SITE_OTHER): Payer: BC Managed Care – PPO | Admitting: Infectious Disease

## 2011-01-31 DIAGNOSIS — A318 Other mycobacterial infections: Secondary | ICD-10-CM

## 2011-01-31 DIAGNOSIS — J479 Bronchiectasis, uncomplicated: Secondary | ICD-10-CM

## 2011-01-31 DIAGNOSIS — B449 Aspergillosis, unspecified: Secondary | ICD-10-CM

## 2011-02-02 ENCOUNTER — Telehealth: Payer: Self-pay | Admitting: Infectious Disease

## 2011-02-05 LAB — AFB CULTURE WITH SMEAR (NOT AT ARMC): Acid Fast Smear: NONE SEEN

## 2011-02-06 NOTE — Progress Notes (Addendum)
Summary: Stopping voriconazole, adding rifampin back  Phone Note Outgoing Call   Call placed by: Alcide Evener MD,  February 02, 2011 1:59 PM Details for Reason: called re discussion Summary of Call: I discussed this patients case with Dr. Cassandria Santee after I had reconsidered idea of voriconazole and both John and I feel comfortable stopping the voriconazole. I will put Mr Dimaano back on rifampin along with his clarithro and ethambutol for his MAvium. I called the pt and discussed this with him as well. Initial call taken by: Alcide Evener MD,  February 02, 2011 2:10 PM    New/Updated Medications: RIFADIN 300 MG CAPS (RIFAMPIN) take two tablets daily                Prescriptions: RIFADIN 300 MG CAPS (RIFAMPIN) take two tablets daily  #60 x 11   Entered and Authorized by:   Alcide Evener MD   Signed by:   Rhina Brackett Dam MD on 02/02/2011   Method used:   Electronically to        [facility].* # [phone]* (retail)       [address]       [place], [place]  [address]       Ph: [phone]       Fax: [phone]   RxID:   [id number]

## 2011-02-06 NOTE — Assessment & Plan Note (Addendum)
Summary: NEW TO MD F/U HFU [MKJ]   Vital Signs:  Patient profile:   35 year old male Height:      68 inches (172.72 cm) Weight:      137.8 pounds (62.64 kg) BMI:     21.03 Temp:     98.7 degrees F (37.06 degrees C) oral Pulse rate:   99 / minute BP sitting:   128 / 85  (right arm)  Vitals Entered By: Myrtis Hopping CMA Deborra Medina) (January 31, 2011 11:23 AM) CC: new pt., hospital followup, lab results Is Patient Diabetic? No Pain Assessment Patient in pain? no      Nutritional Status BMI of 19 -24 = normal Nutritional Status Detail appetite "good"  Have you ever been in a relationship where you felt threatened, hurt or afraid?No   Does patient need assistance? Functional Status Self care Ambulation Normal   Visit Type:  Follow-up Referring Provider:  Dr. Arlyce Dice Primary Provider:  Dr Arlyce Dice - CVTS, [name] - Pulmonary., Dr Wendie Agreste - ID  CC:  new pt., hospital followup, and lab results.  History of Present Illness: 35 year old man with bullous lung disease being treated by Dr. Garnette Scheuermann in [place] for MAI infection who developed hemoptysis and was found to ahve an aspergilloma from left upper lobe (he also had grew it from sputum cuulturs form [facility] office). He is status post resection of left upper lobe wedge which grew aspergillus.He was rx voriconazole and levels have been therapeutic. I had elected to keep him on voriconazole postoperatively for several months after resection of the aspergilloma.He is till taking his ETHambutol and clarithromycin for MAI. He is doing well post surgeyr and has no complaints Still coughing every day 3-4 tmes  a day, generally  nonproductive. His vfrend level was therapeutic when checked.  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 Take One and Ah Have Tablets Twice Daily and Then One Tablet Twice Daily 2)  Symbicort 80-4.5 Mcg/act Aero (Budesonide-Formoterol Fumarate) .... 2 Puffs Twice Daily 3)  Biaxin 500 Mg Tabs (Clarithromycin) .... Take 1 Tablet By Mouth Two Times A Day 4)  Synthroid 50 Mcg Tabs (Levothyroxine Sodium) .... Take 1 Tablet By Mouth Once A Day 5)  Ethambutol Hcl 400 Mg Tabs (Ethambutol Hcl) .... 2.5 Tablets Daily 6)  Ferro-Bob 325 (65 Fe) Mg Tabs (Ferrous Sulfate) .... Take 1 Tablet By Mouth Once A Day 7)  Spiriva Handihaler 18 Mcg  Caps (Tiotropium Bromide Monohydrate) .... One Puffs in Handihaler Daily  Current Medications (verified): 1)  Voriconazole 200 Mg Tabs (Voriconazole) .... Take One and Ah Have Tablets Twice Daily and Then One Tablet Twice Daily 2)  Symbicort 80-4.5 Mcg/act Aero (Budesonide-Formoterol Fumarate) .... 2 Puffs  Twice Daily 3)  Biaxin 500 Mg Tabs (Clarithromycin) .... Take 1 Tablet By Mouth Two Times A Day 4)  Synthroid 50 Mcg Tabs (Levothyroxine Sodium) .... Take 1 Tablet By Mouth Once A Day 5)  Ethambutol Hcl 400 Mg Tabs (Ethambutol Hcl) .... 2.5 Tablets Daily 6)  Ferro-Bob 325 (65 Fe) Mg Tabs (Ferrous Sulfate) .... Take 1 Tablet By Mouth Once A Day 7)  Spiriva Handihaler 18 Mcg  Caps (Tiotropium Bromide Monohydrate) .... 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   Hypothyroidism secondary to ablation for Graves disease  Past Surgical History: Last updated: 01/16/2011 Left VATS thoracotomy and LUL apical posterior segmentectomy 2012  Family History: Last updated: 01/16/2011 noncontributory adopted  Social History: Last updated: 01/16/2011 Quit in 2009, started in 1990, avg 2/3 of pack per day Works 2 jobs  Single   adopted  Risk Factors: Alcohol Use: quit (01/31/2011) Caffeine Use: tea 3 per day (01/31/2011) Exercise: no (01/31/2011)  Risk Factors: Smoking Status: quit > 6 months (01/31/2011) Packs/Day: 0.75 (01/31/2011)  Family History: Reviewed history from 01/16/2011 and no changes required. noncontributory adopted  Social History: Reviewed history from 01/16/2011 and no changes required. Quit in 2009, started in 1990, avg 2/3 of pack per day Works 2 jobs  Single   adopted  Review of Systems       see HPI otherwise negative on 12 point review  Physical Exam  General:  alert and underweight appearing.   Head:  normocephalic and atraumatic.   Eyes:  vision grossly intact.   Nose:  no external deformity.   Mouth:  good dentition and pharynx pink and moist.   Neck:  supple.   Lungs:  normal respiratory effort, no crackles, and no wheezes.   Heart:  normal rate, regular rhythm, and no murmur.   Abdomen:  soft, non-tender, and normal bowel sounds.   Msk:  normal ROM and no joint tenderness.   Neurologic:  alert & oriented X3, cranial nerves II-XII intact, and gait normal.   Skin:  surgical scars cdi Psych:  Oriented X3, memory intact for recent and remote, and normally interactive.     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 This may indeed be overkill but he did have extensive bronchiectasis and multiple nodules though I still doubt these are fungal but more likely his M  avium  Orders: Est. 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 Nodular. He is currently on azithro and ethambutol havign switched him off of rifampin due to concerns of drug drug interatction with his vfend. He needs a year of effective rx flanked by negative AFB cultures. He reportedly had negative AFB culture on sputum in November with Dr. Garnette Scheuermann  Orders: Est. Patient Level IV (99357)  Problem # 3:  BRONCHIECTASIS (ICD-494.0)  see above discussion  Orders: Est. Patient Level IV (01779)  Patient Instructions: 1)  continue to take your voriconazole and your ethambutol and azithromycin 2)  I will contact [place]  at [facility] about your case 3)  rtc to see Dr Tommy Medal in 3 months   Orders Added: 1)  Est. Patient Level IV [39030]

## 2011-02-28 ENCOUNTER — Telehealth: Payer: Self-pay | Admitting: Internal Medicine

## 2011-02-28 NOTE — Telephone Encounter (Signed)
Spoke w/ Juliann Pulse and she states pt informed her today that he has been running a fever x 2 days of 102. Juliann Pulse states she checked pt lungs out today and can hear some wheezing. Pt is coming in to see TP tomorrow at 4:15 to be evaluated. I advised Juliann Pulse to advise pt if he gets worse overnight he needs to go to ED. Juliann Pulse states she will advise pt of that. Nothing further was needed

## 2011-03-01 ENCOUNTER — Ambulatory Visit (INDEPENDENT_AMBULATORY_CARE_PROVIDER_SITE_OTHER)
Admission: RE | Admit: 2011-03-01 | Discharge: 2011-03-01 | Disposition: A | Discharge status: Home / Self Care | Payer: BC Managed Care – PPO | Source: Ambulatory Visit | Attending: Adult Health | Admitting: Adult Health

## 2011-03-01 ENCOUNTER — Ambulatory Visit (INDEPENDENT_AMBULATORY_CARE_PROVIDER_SITE_OTHER): Payer: BC Managed Care – PPO | Admitting: Adult Health

## 2011-03-01 ENCOUNTER — Encounter: Payer: Self-pay | Admitting: Adult Health

## 2011-03-01 VITALS — BP 96/60 | HR 100 | Temp 101.0°F | Ht 69.0 in | Wt 140.4 lb

## 2011-03-01 DIAGNOSIS — J479 Bronchiectasis, uncomplicated: Secondary | ICD-10-CM

## 2011-03-01 DIAGNOSIS — A318 Other mycobacterial infections: Secondary | ICD-10-CM

## 2011-03-01 DIAGNOSIS — B449 Aspergillosis, unspecified: Secondary | ICD-10-CM

## 2011-03-01 MED ORDER — LEVOFLOXACIN 750 MG PO TABS: 750.0000 mg | ORAL_TABLET | Freq: Every day | ORAL | Status: AC

## 2011-03-01 MED ORDER — ALBUTEROL SULFATE (2.5 MG/3ML) 0.083% IN NEBU
2.5000 mg | INHALATION_SOLUTION | Freq: Once | RESPIRATORY_TRACT | Status: AC
  Administered 2011-03-01: 2.5 mg via RESPIRATORY_TRACT

## 2011-03-01 NOTE — Patient Instructions (Signed)
Hold Triple MAI med regimen (Rifampin/Ethambutol/Biaxin ) for 7 days  Begin Levaquin 772m daily for 7 days--take with food  Mucinex DM Twice daily  As needed  Cough/congestion Fluids and rest  Tylenol As needed  Fever.  I will call with xray results.  Increase Symbicort 1652m2 puffs Twice daily  Until sample is done then back to reg. Dose.  May use ProAir 2 puffs every 4hr As needed  Wheezing/shortness

## 2011-03-01 NOTE — Progress Notes (Signed)
  Subjective:    Patient ID: [name], male    DOB: January 25, 1976, 35 y.o.   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 On MAC Rx since 2010 and voricanazole since Jan 2012. PFTs jan 2012 - showed Severe Obstruction   January 16, 2011. This is post hospital fu for above after dc on 12/27/2010. Very dyspneic at dicscharge even for minor activities, now getting better. Still with class 2- 3 dyspnea. Did desaturate after walking 155 feeet x 3 laps in office. Wants to know how much more he can improve. No hemoptysis since surgery - longest spell wihtout hemoptysis. Has open wound at site of former chest tube. Compliant with voricaazole and MAC Rx. Wants to know how long MAC Rx. Wants to know if hhe has to keep [facility] appt for MAc. Has now transferred care from [facility] to [facility]. No new issues.   03/01/2011 Acute office visit.  Complains over last week of increased cough/congestion and shortness of breath. Fever started 4 days ago , tmax of 104. Assoicated malaise, chills. Appetite remains low, no n/v/d. No otc  VFend stopped 2 weeks ago. Back on Rifampin for last weeks. He is followed by the ID clinic.  No hemoptysis or leg swelling. No energy , wears out easily. Cough is worse yesterday/today.    Review of Systems Constitutional:   No  weight loss, night sweats,   HEENT:   No headaches,  Difficulty swallowing,  Tooth/dental problems, or  Sore throat,                No sneezing, itching, ear ache, nasal congestion, post nasal drip,   CV:  No chest pain,  Orthopnea, PND, swelling in lower extremities, anasarca, dizziness, palpitations, syncope.   GI  No heartburn, indigestion, abdominal pain, nausea, vomiting, diarrhea, change in bowel habits, loss of appetite, bloody stools.   Resp:     No coughing up of blood.   No wheezing.  No chest wall deformity  Skin: no rash or  lesions.  GU: no dysuria, change in color of urine, no urgency or frequency.  No flank pain, no hematuria   MS:  No joint pain or swelling.  No decreased range of motion.  No back pain.  Psych:  No change in mood or affect. No depression or anxiety.  No memory loss.    Objective:   Physical Exam GEN: A/Ox3; pleasant , NAD, well nourished   HEENT:  [place]/AT,  EACs-clear, TMs-wnl, NOSE-clear, THROAT-clear, no lesions, no postnasal drip or exudate noted.   NECK:  Supple w/ fair ROM; no JVD; normal carotid impulses w/o bruits; no thyromegaly or nodules palpated; no lymphadenopathy.  RESP  Coarse BS w/ scattered rhonchi  CARD:  RRR, no m/r/g  , no peripheral edema, pulses intact, no cyanosis or clubbing.  GI:   Soft & nt; nml bowel sounds; no organomegaly or masses detected.  Musco: Warm bil, no deformities or joint swelling noted.   Neuro: alert, no focal deficits noted.    Skin: Warm, no lesions or rashes       Assessment & Plan:

## 2011-03-02 ENCOUNTER — Encounter: Payer: Self-pay | Admitting: Internal Medicine

## 2011-03-02 ENCOUNTER — Telehealth: Payer: Self-pay | Admitting: Adult Health

## 2011-03-02 NOTE — Telephone Encounter (Signed)
Spoke w/ pt and he states Tammy Parrett left him a VM to call her back. Please advise Tammy if you attempted to call pt. Thanks  Charma Igo, [place]

## 2011-03-02 NOTE — Telephone Encounter (Signed)
Spoke with pt

## 2011-03-05 ENCOUNTER — Ambulatory Visit (INDEPENDENT_AMBULATORY_CARE_PROVIDER_SITE_OTHER)
Admission: RE | Admit: 2011-03-05 | Discharge: 2011-03-05 | Disposition: A | Discharge status: Home / Self Care | Payer: BC Managed Care – PPO | Source: Ambulatory Visit | Attending: Adult Health | Admitting: Adult Health

## 2011-03-05 ENCOUNTER — Telehealth: Payer: Self-pay | Admitting: Adult Health

## 2011-03-05 ENCOUNTER — Inpatient Hospital Stay (HOSPITAL_COMMUNITY)
Admission: AD | Admit: 2011-03-05 | Discharge: 2011-03-12 | DRG: 092 | Disposition: A | Discharge status: Home / Self Care | Payer: BC Managed Care – PPO | Source: Ambulatory Visit | Attending: Internal Medicine | Admitting: Internal Medicine

## 2011-03-05 ENCOUNTER — Other Ambulatory Visit: Payer: Self-pay | Admitting: Thoracic Surgery

## 2011-03-05 ENCOUNTER — Ambulatory Visit: Payer: BC Managed Care – PPO | Admitting: Internal Medicine

## 2011-03-05 DIAGNOSIS — B470 Eumycetoma: Secondary | ICD-10-CM

## 2011-03-05 DIAGNOSIS — R339 Retention of urine, unspecified: Secondary | ICD-10-CM | POA: Diagnosis present

## 2011-03-05 DIAGNOSIS — J479 Bronchiectasis, uncomplicated: Secondary | ICD-10-CM | POA: Diagnosis present

## 2011-03-05 DIAGNOSIS — R112 Nausea with vomiting, unspecified: Secondary | ICD-10-CM | POA: Diagnosis present

## 2011-03-05 DIAGNOSIS — J189 Pneumonia, unspecified organism: Secondary | ICD-10-CM

## 2011-03-05 DIAGNOSIS — D381 Neoplasm of uncertain behavior of trachea, bronchus and lung: Secondary | ICD-10-CM

## 2011-03-05 DIAGNOSIS — A318 Other mycobacterial infections: Secondary | ICD-10-CM

## 2011-03-05 DIAGNOSIS — E039 Hypothyroidism, unspecified: Secondary | ICD-10-CM | POA: Diagnosis present

## 2011-03-05 DIAGNOSIS — G4700 Insomnia, unspecified: Secondary | ICD-10-CM | POA: Diagnosis not present

## 2011-03-05 DIAGNOSIS — J398 Other specified diseases of upper respiratory tract: Secondary | ICD-10-CM | POA: Diagnosis present

## 2011-03-05 DIAGNOSIS — B4481 Allergic bronchopulmonary aspergillosis: Principal | ICD-10-CM | POA: Diagnosis present

## 2011-03-05 LAB — COMPREHENSIVE METABOLIC PANEL
ALT: 15 U/L (ref 0–53)
AST: 23 U/L (ref 0–37)
Alkaline Phosphatase: 104 U/L (ref 39–117)
CO2: 26 mEq/L (ref 19–32)
Chloride: 98 mEq/L (ref 96–112)
GFR calc Af Amer: >60 mL/min (ref >60)
GFR calc non Af Amer: >60 mL/min (ref >60)
Glucose, Bld: 193 mg/dL — ABNORMAL HIGH (ref 70–99)
Sodium: 132 mEq/L — ABNORMAL LOW (ref 135–145)
Total Bilirubin: 0.6 mg/dL (ref 0.3–1.2)

## 2011-03-05 LAB — DIFFERENTIAL
Basophils Absolute: 0 K/uL (ref 0.0–0.1)
Basophils Relative: 0 % (ref 0–1)
Eosinophils Absolute: 0 K/uL (ref 0.0–0.7)
Eosinophils Relative: 0 % (ref 0–5)
Lymphocytes Relative: 5 % — ABNORMAL LOW (ref 12–46)
Lymphs Abs: 0.8 K/uL (ref 0.7–4.0)
Monocytes Absolute: 1 K/uL (ref 0.1–1.0)
Monocytes Relative: 6 % (ref 3–12)
Neutro Abs: 14.1 K/uL — ABNORMAL HIGH (ref 1.7–7.7)
Neutrophils Relative %: 88 % — ABNORMAL HIGH (ref 43–77)

## 2011-03-05 LAB — URINALYSIS, ROUTINE W REFLEX MICROSCOPIC
Glucose, UA: NEGATIVE mg/dL
Ketones, ur: 15 mg/dL — AB
Protein, ur: 100 mg/dL — AB
Urobilinogen, UA: 1 mg/dL (ref 0.0–1.0)

## 2011-03-05 LAB — URINE MICROSCOPIC-ADD ON

## 2011-03-05 LAB — CBC
HCT: 38.6 % — ABNORMAL LOW (ref 39.0–52.0)
Hemoglobin: 13.3 g/dL (ref 13.0–17.0)
MCV: 75.1 fL — ABNORMAL LOW (ref 78.0–100.0)
RDW: 14.2 % (ref 11.5–15.5)
WBC: 15.9 10*3/uL — ABNORMAL HIGH (ref 4.0–10.5)

## 2011-03-05 LAB — PROCALCITONIN

## 2011-03-05 NOTE — Telephone Encounter (Signed)
Spoke w/ pt and he states he still has been running a fever. Today it has been b/w 99.5-100. Spoke w/ TP and she states for pt to go have CT today and we will call him as soon as we get the results to talk about what to do next. Pt verbalized understanding

## 2011-03-05 NOTE — Assessment & Plan Note (Addendum)
Acute flare Bronchiectasis in complicated pt with underlying MAC / aspergilloma w/ recent VATS  cxr pending  Case discussed and reviewed with Dr. Melvyn Novas   Plan:  Hold Triple MAI med regimen (Rifampin/Ethambutol/Biaxin ) for 7 days  Begin Levaquin 742m daily for 7 days--take with food  Mucinex DM Twice daily  As needed  Cough/congestion Fluids and rest  Tylenol As needed  Fever.  I will call with xray results.  Increase Symbicort 164m2 puffs Twice daily  Until sample is done then back to reg. Dose.  May use ProAir 2 puffs every 4hr As needed  Wheezing/shortness  follow up next week Dr. RaChase Callers planned and Dr BuArlyce Diceext week  Please contact office for sooner follow up if symptoms do not improve or worsen or seek emergency care

## 2011-03-06 ENCOUNTER — Inpatient Hospital Stay (HOSPITAL_COMMUNITY): Payer: BC Managed Care – PPO

## 2011-03-06 ENCOUNTER — Ambulatory Visit: Payer: Self-pay | Admitting: Thoracic Surgery

## 2011-03-06 DIAGNOSIS — B4481 Allergic bronchopulmonary aspergillosis: Secondary | ICD-10-CM

## 2011-03-06 DIAGNOSIS — J189 Pneumonia, unspecified organism: Secondary | ICD-10-CM

## 2011-03-06 DIAGNOSIS — A318 Other mycobacterial infections: Secondary | ICD-10-CM

## 2011-03-06 DIAGNOSIS — R6510 Systemic inflammatory response syndrome (SIRS) of non-infectious origin without acute organ dysfunction: Secondary | ICD-10-CM

## 2011-03-06 DIAGNOSIS — E876 Hypokalemia: Secondary | ICD-10-CM

## 2011-03-06 LAB — CBC
HCT: 35.2 % — ABNORMAL LOW (ref 39.0–52.0)
Platelets: 302 10*3/uL (ref 150–400)
RDW: 14.2 % (ref 11.5–15.5)
WBC: 15.3 10*3/uL — ABNORMAL HIGH (ref 4.0–10.5)

## 2011-03-06 LAB — BRAIN NATRIURETIC PEPTIDE: Pro B Natriuretic peptide (BNP): 37 pg/mL (ref 0.0–100.0)

## 2011-03-06 LAB — BASIC METABOLIC PANEL
GFR calc non Af Amer: >60 mL/min (ref >60)
Potassium: 2.8 mEq/L — ABNORMAL LOW (ref 3.5–5.1)
Sodium: 132 mEq/L — ABNORMAL LOW (ref 135–145)

## 2011-03-06 LAB — DIFFERENTIAL
Basophils Absolute: 0 10*3/uL (ref 0.0–0.1)
Eosinophils Absolute: 0 10*3/uL (ref 0.0–0.7)
Eosinophils Relative: 0 % (ref 0–5)
Lymphocytes Relative: 8 % — ABNORMAL LOW (ref 12–46)
Neutrophils Relative %: 84 % — ABNORMAL HIGH (ref 43–77)

## 2011-03-06 LAB — EXPECTORATED SPUTUM ASSESSMENT W GRAM STAIN, RFLX TO RESP C

## 2011-03-06 LAB — URINE CULTURE

## 2011-03-06 LAB — LACTIC ACID, PLASMA: Lactic Acid, Venous: 2.2 mmol/L (ref 0.5–2.2)

## 2011-03-06 LAB — TSH: TSH: 0.04 u[IU]/mL — ABNORMAL LOW (ref 0.350–4.500)

## 2011-03-07 ENCOUNTER — Inpatient Hospital Stay (HOSPITAL_COMMUNITY): Payer: BC Managed Care – PPO

## 2011-03-07 DIAGNOSIS — J479 Bronchiectasis, uncomplicated: Secondary | ICD-10-CM

## 2011-03-07 LAB — CBC
HCT: 35.1 % — ABNORMAL LOW (ref 39.0–52.0)
MCHC: 34.2 g/dL (ref 30.0–36.0)
MCV: 74.7 fL — ABNORMAL LOW (ref 78.0–100.0)
Platelets: 297 10*3/uL (ref 150–400)
RDW: 14.6 % (ref 11.5–15.5)
WBC: 12.6 10*3/uL — ABNORMAL HIGH (ref 4.0–10.5)

## 2011-03-07 LAB — RENAL FUNCTION PANEL
CO2: 24 mEq/L (ref 19–32)
CO2: 24 mEq/L (ref 19–32)
Calcium: 8 mg/dL — ABNORMAL LOW (ref 8.4–10.5)
Chloride: 102 mEq/L (ref 96–112)
Chloride: 98 mEq/L (ref 96–112)
GFR calc Af Amer: >60 mL/min (ref >60)
GFR calc non Af Amer: >60 mL/min (ref >60)
Glucose, Bld: 146 mg/dL — ABNORMAL HIGH (ref 70–99)
Glucose, Bld: 171 mg/dL — ABNORMAL HIGH (ref 70–99)
Phosphorus: 2.4 mg/dL (ref 2.3–4.6)
Potassium: 3.1 mEq/L — ABNORMAL LOW (ref 3.5–5.1)
Sodium: 132 mEq/L — ABNORMAL LOW (ref 135–145)
Sodium: 131 mEq/L — ABNORMAL LOW (ref 135–145)

## 2011-03-07 LAB — DIFFERENTIAL
Basophils Absolute: 0 10*3/uL (ref 0.0–0.1)
Eosinophils Absolute: 0.1 10*3/uL (ref 0.0–0.7)
Eosinophils Relative: 1 % (ref 0–5)
Monocytes Absolute: 0.8 10*3/uL (ref 0.1–1.0)

## 2011-03-07 LAB — MAGNESIUM: Magnesium: 1.7 mg/dL (ref 1.5–2.5)

## 2011-03-07 LAB — PROCALCITONIN: Procalcitonin: 1.74 ng/mL

## 2011-03-07 LAB — RHEUMATOID FACTOR: Rhuematoid fact SerPl-aCnc: 16 IU/mL — ABNORMAL HIGH (ref <=14)

## 2011-03-07 LAB — CK: Total CK: 33 U/L (ref 7–232)

## 2011-03-07 LAB — STREP PNEUMONIAE URINARY ANTIGEN: Strep Pneumo Urinary Antigen: NEGATIVE

## 2011-03-07 LAB — PHOSPHORUS: Phosphorus: 1.9 mg/dL — ABNORMAL LOW (ref 2.3–4.6)

## 2011-03-07 LAB — IGE: IgE (Immunoglobulin E), Serum: 4185.2 IU/mL — ABNORMAL HIGH (ref 0.0–180.0)

## 2011-03-08 ENCOUNTER — Inpatient Hospital Stay (HOSPITAL_COMMUNITY): Payer: BC Managed Care – PPO

## 2011-03-08 ENCOUNTER — Other Ambulatory Visit: Payer: Self-pay | Admitting: Internal Medicine

## 2011-03-08 DIAGNOSIS — B4481 Allergic bronchopulmonary aspergillosis: Secondary | ICD-10-CM

## 2011-03-08 LAB — SJOGRENS SYNDROME-B EXTRACTABLE NUCLEAR ANTIBODY: SSB (La) (ENA) Antibody, IgG: <1 AU/mL (ref <30)

## 2011-03-08 LAB — CULTURE, RESPIRATORY W GRAM STAIN: Culture: NORMAL

## 2011-03-08 LAB — LEGIONELLA ANTIGEN, URINE: Legionella Antigen, Urine: NEGATIVE

## 2011-03-08 LAB — IGG: IgG (Immunoglobin G), Serum: 1560 mg/dL (ref 694–1618)

## 2011-03-08 LAB — ASPERGILLUS GALACTOMANNAN ANTIGEN: Aspergillus galactomannan Index: 0.4 (ref <0.5)

## 2011-03-09 ENCOUNTER — Inpatient Hospital Stay (HOSPITAL_COMMUNITY): Payer: BC Managed Care – PPO

## 2011-03-09 ENCOUNTER — Ambulatory Visit: Payer: BC Managed Care – PPO | Admitting: Internal Medicine

## 2011-03-09 LAB — PNEUMOCYSTIS JIROVECI SMEAR BY DFA
Pneumocystis jiroveci Ag: NEGATIVE
Pneumocystis jiroveci Ag: NEGATIVE

## 2011-03-09 LAB — CBC
MCV: 75.1 fL — ABNORMAL LOW (ref 78.0–100.0)
RBC: 4.89 MIL/uL (ref 4.22–5.81)
WBC: 11.7 10*3/uL — ABNORMAL HIGH (ref 4.0–10.5)

## 2011-03-09 LAB — ANTI-NEUTROPHIL ANTIBODY

## 2011-03-09 LAB — BASIC METABOLIC PANEL
CO2: 27 mEq/L (ref 19–32)
Calcium: 8.6 mg/dL (ref 8.4–10.5)
Creatinine, Ser: 0.65 mg/dL (ref 0.4–1.5)
Glucose, Bld: 192 mg/dL — ABNORMAL HIGH (ref 70–99)
Sodium: 134 mEq/L — ABNORMAL LOW (ref 135–145)

## 2011-03-09 LAB — IGE: IgE (Immunoglobulin E), Serum: 4744.6 IU/mL — ABNORMAL HIGH (ref 0.0–180.0)

## 2011-03-10 DIAGNOSIS — J90 Pleural effusion, not elsewhere classified: Secondary | ICD-10-CM

## 2011-03-11 LAB — CULTURE, BLOOD (ROUTINE X 2)
Culture  Setup Time: 201204162316
Culture: NO GROWTH
Culture: NO GROWTH

## 2011-03-11 LAB — CBC
Hemoglobin: 12.7 g/dL — ABNORMAL LOW (ref 13.0–17.0)
MCH: 25.9 pg — ABNORMAL LOW (ref 26.0–34.0)
Platelets: 465 10*3/uL — ABNORMAL HIGH (ref 150–400)
RBC: 4.9 MIL/uL (ref 4.22–5.81)

## 2011-03-11 LAB — CULTURE, RESPIRATORY W GRAM STAIN

## 2011-03-11 LAB — HEPATIC FUNCTION PANEL
ALT: 36 U/L (ref 0–53)
Albumin: 2.5 g/dL — ABNORMAL LOW (ref 3.5–5.2)
Alkaline Phosphatase: 90 U/L (ref 39–117)
Indirect Bilirubin: 0.2 mg/dL — ABNORMAL LOW (ref 0.3–0.9)
Total Protein: 7.2 g/dL (ref 6.0–8.3)

## 2011-03-12 LAB — URINALYSIS, ROUTINE W REFLEX MICROSCOPIC
Glucose, UA: NEGATIVE mg/dL
Nitrite: NEGATIVE
Specific Gravity, Urine: 1.017 (ref 1.005–1.030)
pH: 6.5 (ref 5.0–8.0)

## 2011-03-13 LAB — CULTURE, BLOOD (ROUTINE X 2)
Culture  Setup Time: 201204181501
Culture: NO GROWTH

## 2011-03-13 LAB — URINE CULTURE
Colony Count: NO GROWTH
Culture  Setup Time: 201204230945

## 2011-03-14 LAB — LEGIONELLA PROFILE(CULTURE+DFA/SMEAR)
Legionella Antigen (DFA): NEGATIVE
Legionella Antigen (DFA): NEGATIVE

## 2011-03-16 ENCOUNTER — Encounter: Payer: Self-pay | Admitting: Adult Health

## 2011-03-19 ENCOUNTER — Ambulatory Visit (INDEPENDENT_AMBULATORY_CARE_PROVIDER_SITE_OTHER): Payer: BC Managed Care – PPO | Admitting: Adult Health

## 2011-03-19 ENCOUNTER — Encounter: Payer: Self-pay | Admitting: Adult Health

## 2011-03-19 ENCOUNTER — Telehealth: Payer: Self-pay | Admitting: Adult Health

## 2011-03-19 DIAGNOSIS — A318 Other mycobacterial infections: Secondary | ICD-10-CM

## 2011-03-19 DIAGNOSIS — B4481 Allergic bronchopulmonary aspergillosis: Secondary | ICD-10-CM

## 2011-03-19 DIAGNOSIS — J479 Bronchiectasis, uncomplicated: Secondary | ICD-10-CM

## 2011-03-19 DIAGNOSIS — B449 Aspergillosis, unspecified: Secondary | ICD-10-CM

## 2011-03-19 NOTE — Patient Instructions (Signed)
We will call you with info on skin test .  follow up [name] in 1-2  week  follow up ID clinic next week as planned.  No work until seen by ID clinic  Continue on current regimen.  Please contact office for sooner follow up if symptoms do not improve or worsen or seek emergency care

## 2011-03-19 NOTE — Progress Notes (Signed)
Subjective:    Patient ID: [name], male    DOB: 1976/03/14, 35 y.o.   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 On MAC Rx since 2010 and voricanazole since Jan 2012. PFTs jan 2012 - showed Severe Obstruction   January 16, 2011. This is post hospital fu for above after dc on 12/27/2010. Very dyspneic at dicscharge even for minor activities, now getting better. Still with class 2- 3 dyspnea. Did desaturate after walking 155 feeet x 3 laps in office. Wants to know how much more he can improve. No hemoptysis since surgery - longest spell wihtout hemoptysis. Has open wound at site of former chest tube. Compliant with voricaazole and MAC Rx. Wants to know how long MAC Rx. Wants to know if h[facility] has [facility]. Has now transferred care from [facility] to [facility]. No new issues.   03/01/2011  Acute office visit.  Complains over last week of increased cough/congestion and shortness of breath. Fever started 4 days ago , tmax of 104. Assoicated malaise, chills. Appetite remains low, no n/v/d. No otc  VFend stopped 2 weeks ago. Back on Rifampin for last weeks. He is followed by the ID clinic.  No hemoptysis or leg swelling. No energy , wears out easily. Cough is worse yesterday/today. >>tx w/   03/19/2011 Post Hospital  Pt returns for post hospital follow up. PT admitted for worsening cough/congestion and weakness despite outpatient tx . Pt was admitted 4/16-4/23 for slow to resolve bronchiectasis exacerbation and bilateral PNA w/ increased ASPDZ  Found to have   Allergic bronchopulmonary aspergillosis.    IgE on March 07, 2011, was SIGNIFICANTLY ELEVATED AT 4185.    Followup IgE on March 08, 4743. PCP from a bronchioalveolar lavage was negative.  BAL showed   bronchoscopy culture shows few fungus, few Candida  albicans. AFB preliminary report is negative.   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   His fevers resolved.  Pt was followed closely by ID practice during hospital stay. He was discharged on voriconazole and  MAC tx.   Since discharge he is feeling some better but still very weak. Fevers are resolve. Appetite remains low and weight continues  To trend down. 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 HE is concerned he will not be able to work due to how sick he is.   Review of Systems  Constitutional:   +  weight loss, - night sweats,   HEENT:   No headaches,  Difficulty swallowing,  Tooth/dental problems, or  Sore throat,                No sneezing, itching, ear ache, nasal congestion, post nasal drip,   CV:  No chest pain,  Orthopnea, PND, swelling in lower extremities, anasarca, dizziness, palpitations, syncope.   GI  No heartburn, indigestion, abdominal pain, nausea, vomiting, diarrhea, change in bowel habits, loss of appetite, bloody stools.   Resp:     No coughing up of blood.   No wheezing.  No chest wall deformity  Skin: no rash or lesions.  GU: no dysuria, change in color of urine, no urgency or frequency.  No flank  pain, no hematuria   MS:  No joint pain or swelling.  No decreased range of motion.  No back pain.  Psych:  No change in mood or affect. No depression or anxiety.  No memory loss.    Objective:   Physical Exam  GEN: A/Ox3; pleasant , NAD, well nourished   HEENT:  [place]/AT,  EACs-clear, TMs-wnl, NOSE-clear, THROAT-clear, no lesions, no postnasal drip or exudate noted.   NECK:  Supple w/ fair ROM; no JVD; normal carotid impulses w/o bruits; no thyromegaly or nodules palpated; no lymphadenopathy.  RESP  Coarse BS w/ scattered rhonchi  CARD:  RRR, no m/r/g  , no peripheral edema, pulses intact, no cyanosis or clubbing.  GI:   Soft & nt; nml bowel sounds;  no organomegaly or masses detected.  Musco: Warm bil, no deformities or joint swelling noted.   Neuro: alert, no focal deficits noted.    Skin: Warm, no lesions or rashes       Assessment & Plan:

## 2011-03-19 NOTE — Telephone Encounter (Signed)
Spoke w/ pt and he states he would prefer to speak to Tammy directly. Pt states a good call back number was [phone]. Please advise Tammy. Thanks  Charma Igo, [place]

## 2011-03-19 NOTE — Telephone Encounter (Signed)
Spoke to him concerning disabliity paperwork  He will have skin testing on return

## 2011-03-19 NOTE — Consult Note (Signed)
NAMEFAISAL, Farmer                   ACCOUNT NO.:  [id number]  MEDICAL RECORD NO.:  [id number]           PATIENT TYPE:  I  LOCATION:  3012                         FACILITY:  [facility]  PHYSICIAN:  Alcide Evener, MD  DATE OF BIRTH:  18-Jul-1976  DATE OF CONSULTATION:  03/06/2011 DATE OF DISCHARGE:                                CONSULTATION   REQUESTING PHYSICIAN:  Brand Males, MD  REASON FOR INFECTIOUS DISEASE CONSULTATION:  The patient with loculated pneumothorax and concern for healthcare-associated pneumonia.  HISTORY OF PRESENT ILLNESS:  Mr. [name] is a 35 year old [place] American male who I met during his hospitalization in February.  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  Unfortunately he developed aspergilloma and presented with hemoptysis.  Ultimately underwent surgery by Dr. Arlyce Dice on December 23, 2010, with a left-sided VATS and left thoracotomy resection of apical posterior segment left upper lobe and resected aspergilloma.  The patient was placed on voriconazole preoperatively and continued on ethambutol and azithromycin while the rifampin was dropped.  He was continued on the voriconazole along with ethambutol and clarithromycin after surgery up until February 02, 2011.  I have seen him in clinic twice in the interim.  I felt confident that the aspergilloma had been resected and had residual findings of lung were due to MAC and not to aspergillus.  I consulted with Dr. Cassandria Santee at [facility] and he had agreed and we therefore stopped the patient's voriconazole.  I then restarted Mr. Albor on rifampin in conjunction with his Biaxin and ethambutol.  He had been doing well until 1 week ago Tuesday when he developed sudden onset of cough, fevers, rigors, and shaking chills.  He sought evaluation with [facility] on March 01, 2011, and was given a prescription of  levofloxacin 750 mg a day.  He was asked to stop this MAC drugs in the interim.  He continues to take the Levaquin, but continued to have high fevers up to 105 degrees Fahrenheit.  He continued to have cough with production of greenish sputum.  He was brought in to the hospital under the Critical Care Service yesterday. CT scan of the chest performed yesterday revealed air collection along the left hemithorax extending from the lung apex to the hemidiaphragms that consisted of loculated pneumothorax from the left apical bullous. There is also concern for possible air leak bronchopleural fistula. Additionally, the patient was found to have some soft tissue colistin density in the left upper lung possibly consistent with scarring versus active infection and then evidence also possible infected right upper lobe bullae with some new focal airspace disease in the right lower lobe posteriorly suspicious for active infection.  He continues to have interval pulmonary nodules.  The patient was admitted to Pulmonary and placed on moxifloxacin.  I was asked to assist in the management and workup of this complicated patient with Mycobacterium avium infection, prior aspergilloma status post resection now, concern for healthcare- associated pneumonia, and also possible bronchopleural fistula.  PAST MEDICAL HISTORY: 1. Mycobacterium  avium infection. 2. Aspergilloma status post resection by Dr. Arlyce Dice on December 23, 2010. 3. Gastroesophageal reflux disease. 4. Graves disease status post ablation, now on Synthroid. 5. Former smoker.  PAST SURGICAL HISTORY:  Aspergilloma resection described above.  FAMILY HISTORY:  The patient is adopted.  SOCIAL HISTORY:  The patient quit smoking in 2004.  Does not drink alcohol.  REVIEW OF SYSTEMS:  As described in the history of present illness, otherwise also pertinent for some dysuria and polyuria with difficulty emptying his bladder as  well.  ALLERGIES:  No known drug allergies.  CURRENT MEDICATIONS:  The patient has just been changed from Avelox to vancomycin and cefepime.  He is also on albuterol, ipratropium, levothyroxine, pantoprazole, Tylenol, guaifenesin, and Zofran.  PHYSICAL EXAMINATION:  VITAL SIGNS:  Temperature maximum 102.9, temperature current 102.9, blood pressure 128/75, pulse 126, respirations 18, pulse ox 94% on room air. GENERAL:  Quite pleasant gentleman, no acute distress, underweight. HEENT:  Normocephalic, atraumatic.  Pupils are equal and reactive to light.  Sclerae anicteric.  Oropharynx clear. NECK:  Supple. CARDIOVASCULAR:  Tachycardic with regular rate and rhythm.  No murmurs, gallops, or rubs heard. LUNGS:  Some diminished breath sounds at the bases.  No obvious egophony on my exam. ABDOMEN:  Soft, nondistended, and nontender.  Positive bowel sounds. EXTREMITIES:  Without edema.  LABORATORY DATA:  CT scan as described above.  CBC differential showed white count 15,900, hemoglobin 13.3, platelets 306, ANC 14.1.  Lactic acid was 3.  Comprehensive metabolic panel; sodium 865, potassium 3.3, chloride 98, bicarb 26, BUN and creatinine 6 and 0.86, glucose 193. Liver function tests were normal.  Albumin was 2.8.  Procalcitonin was 1.93.  Sedimentation rate was 95.  Urinalysis showed 20-50 white blood cells on microscopic exam, although the gross exam actually was negative for nitrites and leukocytes.  Beta-natriuretic peptide was 37. Respiratory culture sputum showing Gram-positive cocci in pairs.  Blood cultures from the 16th, no growth to date.  IMPRESSION AND RECOMMENDATIONS:  An 35 year old gentleman with history of Mycobacterium avium infection, aspergilloma status post resection now presents with findings concerning for bronchopulmonary fistula and healthcare-associated pneumonia. 1. 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  We are going to cover the patient     broadly for  healthcare-associated pneumonia with vancomycin and     cefepime.  I do think we should talk to Cardiothoracic Surgery to     see if there is any invasive procedures that need to be undertaken     at this point in time. 2. Healthcare-associated pneumonia.  Given the fact that the patient     has failed levofloxacin, we would need to broaden his therapy and     would go to cefepime with vancomycin.  We can consider adding an     aminoglycoside as well. 3. We can check a pneumococcal antigen, urine culture, Legionella     antigen, although I doubt that he has Legionella given that he has     already been on clarithromycin and then been on Levaquin and had     been failing this. 4. Mycobacterium avium infection.  I will reinstitute rifampin and     Biaxin unless there is a drug interaction that precludes treating     this.  I would like to make sure this is treated so that any     clinical deterioration is not thought possibly be due to this. 5. History of aspergilloma:  This was resected.  It is conceivable he  could have residual aspergillus left including the area involving     possible bronchopulmonary fistula.  I have sent a serum     galactomannan assay, although if     this is positive then it can give [place] a clear-cut answer as to what     is going on.  For now, I would cover him with antibacterial therapy     and we can then consider the possibility of aspergillus at a later     point in time.  Thank you for Infectious Disease Consultation.  I will follow along closely.     Alcide Evener, MD     CV/MEDQ  D:  03/06/2011  T:  03/06/2011  Job:  [id number]  cc:   Nicanor Alcon, M.D. Brand Males, MD  Electronically Signed by Rhina Brackett DAM MD on 03/19/2011 08:53:59 PM

## 2011-03-20 ENCOUNTER — Telehealth: Payer: Self-pay | Admitting: Adult Health

## 2011-03-20 NOTE — Discharge Summary (Signed)
[name], [name]                   ACCOUNT NO.:  [id number]  MEDICAL RECORD NO.:  [id number]           PATIENT TYPE:  I  LOCATION:  2500                         FACILITY:  [facility]  PHYSICIAN:  Brand Males, MD   DATE OF BIRTH:  January 13, 1976  DATE OF ADMISSION:  03/05/2011 DATE OF DISCHARGE:  03/12/2011                              DISCHARGE SUMMARY   DISCHARGE DIAGNOSES: 1. Allergic bronchopulmonary aspergillosis. 2. MAC infection  . 3. Obstructive lung disease. 4. Urinary retention.  HISTORY OF PRESENT ILLNESS:  Mr. [name] is a 35 year old male with a known history of MAC infection with bilateral bronchiectasis and aspergilloma status post VATS and left upper lobe segmentectomy in February 2012 followed by the Infectious Disease Clinic.  The patient was seen in the [facility] Pulmonary Office on March 01, 2011, for acute office visit with a several day complaint of increased cough and congestion, increased shortness of breath and high fevers.  The patient was ultimately admitted directly to the hospital after CT of the chest showed worsening airspace disease in the right lower lobe and a question infected right lower lobe bulla and the patient was directly admitted to the hospital under Pulmonary Critical Care Service.  The patient was followed in consultation closely by Infectious Disease and Dr. Arlyce Dice of [facility] throughout this admission.  LABORATORY DATA:  At the time of admission March 05, 2011, CBC white blood cells 15.9, hemoglobin 13.3, hematocrit 38.6, platelets 306. Lactic acid 3.0.  Complete metabolic panel sodium 370, potassium 3.3, glucose 193, BUN 6, creatinine 0.86.  Pro calcitonin 1.93, ESR 95. Urinalysis was negative.  TSH 0.031.  BNP 37.  Other pertinent laboratory data rheumatoid factor was 16, an IgE on March 07, 2011, was SIGNIFICANTLY ELEVATED AT 4185.  Urine Legionella negative.  IgG 1560 and NORMAL.  Followup IgE on March 08, 2011, 4744.  PCP from a  bronchioalveolar lavage was negative.  Most recent laboratory data March 11, 2011, LFTs alk phos 90, AST 29, ALT 36, albumin 2.5, total bilirubin 0.4 and urinalysis on March 11, 2011, was negative.  CULTURE DATA:  Respiratory culture from March 08, 2011, bronchioalveolar lavage from a bronchoscopy culture shows few fungus, few Candida albicans.  AFB preliminary report is negative.  All fungal cultures negative.  Blood cultures x2 on March 05, 2011, final report no growth. Blood cultures x2 on March 07, 2011, preliminary report no growth. Urine Legionella negative.  Urine culture from March 05, 2011, final report is negative.  Sputum culture from March 05, 2011, shows normal flora.  RADIOLOGY DATA:  CT of the chest on March 05, 2011, shows air collection extending anteriorly along the length of the left hemithorax from the lung apex to the hemidiaphragm is new consistent with loculated pneumothorax extending from the left apical bulla with postoperative changes, soft tissue density anterior left upper lung could reflect active infection or scarring, probable infected bullae on the right lower lobe, new focal airspace disease in the right lower lobe posteriorly and innumerable pulmonary nodules consistent with history of mycobacterial infection.  Most recent radiology data March 07, 2011, portable chest  x-ray shows no significant change.  HOSPITAL COURSE BY DISCHARGE DIAGNOSES: 1. ABPA, allergic bronchopulmonary aspergillosis.  The patient's IgE     was markedly elevated, fiberoptic bronchoscopy from March 08, 2011,     ultimately showed negative culture data.  The patient was started     on IV steroids and voriconazole per ID recommendations.  ID     followed the patient closely throughout this admission.  On     discharge, the patient will follow up with Dr. Tommy Medal in     Infectious Disease office as well as [name], he will need a     Aspergillus skin test in 1 week in the  Pulmonary office.  The     patient was treated with IV Primaxin times 6 days.  On the day of     discharge, the patient will receive a 1 gram one-time dose of     Invanz and then will be discharged on voriconazole and his neck     regimen with no further antibiotics above this.  The patient has     been afebrile for the last 72 hours (FEVERS STOPPED AS SOON AS STEROIDS STARTED) and his respiratory status     appears back to his baseline.  He is ready for discharge from a     Pulmonary standpoint.  While on voriconazole, the patient should     have LFTs and other routine labs monitored.  This will be done by     Dr. Tommy Medal. 2. MAC.  The patient has a history of MAC infection.  He has been     restarted on his MAC regimen per Infectious Disease of azithromycin     and ethambutol.  He will follow up with Dr. Tommy Medal in the ID     office. 3. Obstructive lung disease.  This in the setting of #1 and #2  status     post VATS and lobectomy as well as bronchiectasis.  The patient did     undergo PFTs on March 09, 2011.  His FEV-1 was 1.37 which was 32%     predicted, FEV-1 to FVC ratio was 52 of 65% predicted.  The patient     was previously on Symbicort and Spiriva.  He will be discharged on     these and again is to follow up further in the Pulmonary office. 4. Urinary retention.  The patient has had intermittent complaints of     urinary retention and occasional hesitancy, however, he has had     problems voiding without Foley catheter.  Urinalysis has been     negative multiple times during this admission.  The patient will     follow up with Dr. Serita Butcher for a Urology referral as an     outpatient.  DISCHARGE MEDICATIONS: 1. Albuterol inhaler 1-2 puffs inhaled q.3 h. p.r.n. for shortness of     breath and wheezing. 2. Azithromycin 500 mg p.o. daily. 3. Guaifenesin . 4. Robitussin DM 15 mL p.o. q.4 h. p.r.n. for cough. 5. Hycodan cough syrup 5 mL p.o. q.6 h. p.r.n. for cough. 6.  Prednisone 40 mg p.o. daily. 7. Senokot-S 1-2 tablets p.o. at bedtime p.r.n. for constipation. 8. Voriconazole 200 mg p.o. b.i.d. 9. Ambien 5 mg p.o. at bedtime p.r.n. 10.Symbicort 80/4.5 two puffs inhaled b.i.d. 11.Ethambutol 400 mg tabs two and half tablets p.o. daily. 12.Levothyroxine 137 mcg p.o. daily. 13.Spiriva 18 mcg inhaled daily.  DISCHARGE ACTIVITY:  The patient is  to increase activity slowly.  DISCHARGE DIET:  No restrictions.  FOLLOWUP APPOINTMENTS: 1. The patient is to follow up with Dr. Arlyce Dice on Apr 11, 2011, at     12:15.  He is to have a chest x-ray on the first floor at     [facility] and then an appointment with Dr. Arlyce Dice at 1     p.m. 2. The patient is follow up with Rexene Edison, nurse practitioner in     the Pulmonary Office on March 19, 2011, at 11 a.m. 3. The patient is to follow up with Dr. Alvino [place] in Infectious     Disease Clinic on Mar 27, 2011, at 3:15. 4. The patient is to follow up for an initial visit with Dr. Serita Butcher     of [facility] on Mar 21, 2011, at 1:15.  DISPOSITION:  The patient has met maximum benefit from his inpatient hospitalization.  He is medically cleared and ready for discharge.  He is afebrile and his respiratory status is back to his baseline.  He willneed continued and ongoing treatment for his MAC infection as well as ABPA.  He will follow up with [name] in Pulmonary office as well as Dr. Tommy Medal in the Infectious Disease Clinic.  Greater than 30 minutes was spent on this discharge.     Nickolas Madrid, NP   ______________________________ Brand Males, MD    KW/MEDQ  D:  03/12/2011  T:  03/12/2011  Job:  [id number]  cc:   Nicanor Alcon, M.D. Alcide Evener, MD Corky Downs, M.D. Rexene Edison, NP  Electronically Signed by Darlina Sicilian N.P. on 03/15/2011 08:49:21 AM Electronically Signed by Brand Males MD on 03/20/2011 01:17:19 AM

## 2011-03-20 NOTE — Telephone Encounter (Signed)
Pt requesting to speak to Tammy him self. Pt states this was regarding his disability and stated she advised him if he had any other questions she would speak w/ him. Please advise Tammy pt would like you to call him back. Thanks  Charma Igo, [place]

## 2011-03-20 NOTE — Op Note (Signed)
[name], [name]                   ACCOUNT NO.:  [id number]  MEDICAL RECORD NO.:  [id number]           PATIENT TYPE:  I  LOCATION:  2001                         FACILITY:  [facility]  PHYSICIAN:  Brand Males, MD   DATE OF BIRTH:  1976-07-17  DATE OF PROCEDURE: DATE OF DISCHARGE:                              OPERATIVE REPORT   PROCEDURE:  Bronchoscopy with left upper lobe and lingular lavage.  INDICATIONS: 1. Hospital-acquired pneumonia, source unknown persistent fevers. 2. Opportunistic pneumonia. 3. Persistent SIRS with slightly high procalcitonin and lactic acid. 4. Established diagnosis of bilateral upper lobe bronchiectasis with     Mycobacterium avium complex colonization and status post removal of     aspergilloma mycetoma in January 2012, left upper lobe.  PREPROCEDURE PLAN:  History and physical reviewed less than 21 days old, no contraindications for procedure and vital signs were stable.  The patient has no allergies.  He was considered stable for moderate sedation and could undergo procedure.  PREPROCEDURAL SEDATION PLAN:  Fentanyl and Versed for moderate sedation and lidocaine for topical anesthesia.  PROCEDURE NOTE:  After adequate local anesthesia preparation, the small bronchoscope was passed through the left nostril, was easily navigated, vocal cord was entered.  The vocal cord looked normal.  Adequate lidocaine was applied and detailed airway exam was performed.  The tracheal rings looked normal.  The carina was sharp.  Immediately on entrance through the left side, there was extensive white mucus that was present, it was thick and mildly tenacious but was easily aspirated. The mucus coating extended all the way through the left main bronchus and through the left upper lobe and the lingula and they were easily aspirated.  Less mucus was seen in the left lower lobes.  There was no endobronchial lesions seen.  There was clear bronchiectasis of the left upper  lobe and lingular segments.  Attention was turned to the right side where the similar findings were seen on the right upper lobe and the right main bronchus including the mucus in the bronchiectasis but much less significant compared to the left side.  The middle lobe, right lower lobe subsegments were all normal without any endobronchial lesions.  Then, 140 mL lavage was performed in the left upper lobe and lingular combined and thin gray material was aspirated.  This is being sent for cultures including galactomannan assay.  IMPRESSION: 1. Bilateral upper lobe bronchiectasis left upper lobe greater than     right upper lobe. 2. Mucus impaction of the main bronchus, left upper lobe lingula     greater than the right side. 3. Status post bronchoalveolar lavage of the left upper lobe and     lingula. 4. Possible ABPA diagnosis given the high IgE and mucus impaction.  PLAN: 1. Observe in the recovery room. 2. Initiate steroid therapy for ABPA diagnosis and also voriconazole.     This was discussed by Dr. Alcide Evener, [place] [place] physician on  Consultation Service.     Brand Males, MD     MR/MEDQ  D:  03/08/2011  T:  03/09/2011  Job:  [id number]  Electronically Signed by Brand Males MD on 03/20/2011 01:15:30 AM

## 2011-03-22 DIAGNOSIS — B4481 Allergic bronchopulmonary aspergillosis: Secondary | ICD-10-CM | POA: Insufficient documentation

## 2011-03-22 NOTE — Assessment & Plan Note (Deleted)
Cont on current regimen w/ voricanazole  follow up in 1 week [name] with aspergillous skin test

## 2011-03-22 NOTE — Assessment & Plan Note (Signed)
Cont on current regimen w/ voricanazole  follow up in 1 week Dr. Ramaswamy with aspergillous skin test   

## 2011-03-22 NOTE — Assessment & Plan Note (Signed)
Recent slow to resolve flare with associated ABPA Now finshed with IV abx  Plan  Cont on current regimen

## 2011-03-22 NOTE — Telephone Encounter (Signed)
Left message to call back  

## 2011-03-22 NOTE — Assessment & Plan Note (Signed)
Cont on mac tx follow up ID as planned

## 2011-03-27 ENCOUNTER — Ambulatory Visit (INDEPENDENT_AMBULATORY_CARE_PROVIDER_SITE_OTHER): Payer: BC Managed Care – PPO | Admitting: Infectious Disease

## 2011-03-27 VITALS — BP 115/69 | HR 94 | Temp 98.7°F | Ht 69.0 in | Wt 130.0 lb

## 2011-03-27 DIAGNOSIS — B449 Aspergillosis, unspecified: Secondary | ICD-10-CM | POA: Insufficient documentation

## 2011-03-27 DIAGNOSIS — A318 Other mycobacterial infections: Secondary | ICD-10-CM

## 2011-03-27 DIAGNOSIS — B4481 Allergic bronchopulmonary aspergillosis: Secondary | ICD-10-CM

## 2011-03-27 NOTE — Assessment & Plan Note (Signed)
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 He has not yet had a skin test but I don't see a would have something else other than allergic bronchopulmonary aspergillosis as a driving process here. He has responded well to corticosteroids and to voriconazole. I plan on giving him at least 4 months of voriconazole I think he is going to protracted course of quarter steroids. I may consider giving him a total of 6 months of voriconazole. I warned her that this may relapse and admit. I would like you to voriconazole level possibly next Monday at 9 AM in the morning. We could recheck his metabolic panel and CBC at the time. Don't know if we need to get recheck an immunoglobulin E. yet appear to see if every would like to recheck that or any other labs with him and coordinate her labs and do them next Monday morning. However him back in clinic in 2 months time for repeat visit with me.

## 2011-03-27 NOTE — Patient Instructions (Signed)
Continue the voriconazole, steroids, and ethambutol and azithromycin  We would like to check labs at 9am in the morning just before you would take your voriconazole morning dose perhaps next monday

## 2011-03-27 NOTE — Progress Notes (Signed)
Subjective:    Patient ID: [name], male    DOB: 01-09-1976, 35 y.o.   MRN: [id number]  HPI [name] is a 35 year old [name]  male who I met during his hospitalization in February.  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  Ultimately  underwent surgery by [name] on December 23, 2010, with a left-sided   VATS and left thoracotomy resection of apical posterior segment left  upper lobe and resected aspergilloma.  The patient was placed on   voriconazole preoperatively and continued on ethambutol and azithromycin   while the rifampin was dropped.  He was continued on the voriconazole  along with ethambutol and clarithromycin after surgery up until February 02, 2011. After consulting with [name] at [facility] I decided to stop his voriconazole as he had a clear resection of the aspergilloma and had already more than a month of therapy postoperatively. Unfortunately the patient then the developed symptoms of fevers chills or malaise and was seen by pulmonary medicine who gave him levofloxacin without any improvement patient's symptoms. He ultimately was admitted to pulmonary team And a  CT scan of the chest performed  revealed air collection along   the left hemithorax extending from the lung apex to the hemidiaphragms  that consisted of loculated pneumothorax from the left apical bullous.   There was  concern for possible air leak bronchopleural fistula.  Additionally, the patient was found to have some soft tissue collection  density in the left upper lung possibly consistent with scarring versus active infection and then evidence also possible infected right upper   lobe bullae with some new focal airspace disease in the right lower lobe posteriorly suspicious for active infection.  He continues to have   interval  pulmonary nodules.  We cover the patient broadly for healthcare associated pneumonia with meropenem vancomycin and gentamicin. We also continued him on azithromycin and his family tell as well as rifampin initially. The patient's serum IgE level was found to be greater than 4000 and his antibodies to Aspergillus by complement fixation were positive. We are concerned about the possibility of allergic bronchopulmonary aspergillosis the patient underwent bronchoscopy by [name]. Bronchial alveolar lavage has subsequently yielded Aspergillus on 2 cultures done. In the interim we have loaded him with voriconazole and place him on voriconazole a dose of 200 mg twice daily. He is also given high dose corticosteroids and transitioned to oral prednisone. His fevers responded promptly to institution of antifungal therapy and corticosteroids. His antibacterial therapy was narrowed and then ultimately stopped after 7 days. He was changed to azithromycin and  Myambutoll alone to cover Mycobacterium avium infection. I am seeing in followup after hospital stay. Felt much better with improvement in there and him and his cough and reduction of phlegm that was coming out. He still does feel fatigued. Is worried about whether or not he be able to return to work which he does wish to do. He is concerned disability will not pay well enough to support him. I discussed with the patient may need to be on voriconazole for at least 4 months if not 6 months and will also likely need a fairly protracted course of corticosteroids. I described this may be relapsing and remitting condition. He also discussed his Mycobacterium avium infection. He states that he had  a negative AFB culture November of 2011. We've that in the interim we are planning on trying to continue both Myambutol and azithromycin to get him out to a year of effective therapy between a negative AFB smears. It all we spent greater than 45 minutes the patient with greater  than 50% of time according care and counseling the patient.   Review of Systems As in history of present illness otherwise 12 point review of systems is negative.    Objective:   Physical Exam    patient is underweight. HNT normocephalic atraumatic correct lites thank air or frank clear neck supple or vascular exam regular rate rhythm no murmurs rubs rubs heard lungs with prolonged expiratory phase. His lungs was also had slightly diminished breath sounds at the bases. He had no wheezes rhonchi or rales. Abdomen soft nondistended nontender without evidence of enlargement of liver or spleen. Extremities without edema joint exam showed grossly normal cranial nerves and strength and sensation are normal gait. Skin exam showed some tattoos no ecchymoses or petechiae or rashes. Psychiatric normal affect normal thought patterns that are linear and goal-directed.    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 He has not yet had a skin test but I don't see a would have something else other than allergic bronchopulmonary aspergillosis as a driving process here. He has responded well to corticosteroids and to voriconazole. I plan on giving him at least 4 months of voriconazole I think he is going to protracted course of quarter steroids. I may consider giving him a total of 6 months of voriconazole. I warned her that this may relapse and admit. I would like you to voriconazole level possibly next Monday at 9 AM in the morning. We could recheck his metabolic panel and CBC at the time. Don't know if we need to get recheck an immunoglobulin E. yet appear to see if every would like to recheck that or any other labs with him and coordinate her labs and do them next Monday morning. However him back in clinic in 2 months time for repeat visit with me.  BACTEREMIA, MYCOBACTERIUM AVIUM COMPLEX I plan on continuing Myambutol and  azithromycin for now. I am going to avoid rifampin or rifabutin due to concerns of drug interaction with 4, so. I had contemplated addition of moxifloxacin but I would then have him on 3 different drugs that would put that could prolong the QT interval. I am not completely convinced that much of her AVM is driving pathogen here but we are going to cover it for now and try to extend therapy and next November if possible.  Aspergilloma He is status post resection by cardiothoracic surgery. They reviewed his films when he is admitted felt was no evidence of a bronchopleural fistula.

## 2011-03-27 NOTE — Assessment & Plan Note (Signed)
He is status post resection by cardiothoracic surgery. They reviewed his films when he is admitted felt was no evidence of a bronchopleural fistula.

## 2011-03-27 NOTE — Assessment & Plan Note (Signed)
I plan on continuing Myambutol and azithromycin for now. I am going to avoid rifampin or rifabutin due to concerns of drug interaction with 4, so. I had contemplated addition of moxifloxacin but I would then have him on 3 different drugs that would put that could prolong the QT interval. I am not completely convinced that much of her AVM is driving pathogen here but we are going to cover it for now and try to extend therapy and next November if possible.

## 2011-03-30 ENCOUNTER — Ambulatory Visit (INDEPENDENT_AMBULATORY_CARE_PROVIDER_SITE_OTHER): Payer: BC Managed Care – PPO | Admitting: Internal Medicine

## 2011-03-30 ENCOUNTER — Encounter: Payer: Self-pay | Admitting: Internal Medicine

## 2011-03-30 VITALS — BP 100/62 | HR 65 | Temp 98.2°F | Ht 68.0 in | Wt 134.8 lb

## 2011-03-30 DIAGNOSIS — B4481 Allergic bronchopulmonary aspergillosis: Secondary | ICD-10-CM

## 2011-03-30 DIAGNOSIS — J449 Chronic obstructive pulmonary disease, unspecified: Secondary | ICD-10-CM

## 2011-03-30 MED ORDER — PREDNISONE 20 MG PO TABS: 20.0000 mg | ORAL_TABLET | Freq: Every day | ORAL | Status: DC

## 2011-03-30 NOTE — Patient Instructions (Addendum)
Cut prednisone down to 36m per day Have aspergillus skin test today Have alpha 1 anti trypsin genetic test for copd Return in 2 months Spirometry at followup If you are not feeling well with lower prednisone, come sooner

## 2011-04-02 ENCOUNTER — Other Ambulatory Visit (INDEPENDENT_AMBULATORY_CARE_PROVIDER_SITE_OTHER): Payer: BC Managed Care – PPO

## 2011-04-02 DIAGNOSIS — B4481 Allergic bronchopulmonary aspergillosis: Secondary | ICD-10-CM

## 2011-04-02 DIAGNOSIS — Z0000 Encounter for general adult medical examination without abnormal findings: Secondary | ICD-10-CM

## 2011-04-02 DIAGNOSIS — B20 Human immunodeficiency virus [HIV] disease: Secondary | ICD-10-CM

## 2011-04-02 LAB — COMPREHENSIVE METABOLIC PANEL
BUN: 9 mg/dL (ref 6–23)
CO2: 24 mEq/L (ref 19–32)
Creat: 0.71 mg/dL (ref 0.40–1.50)
Glucose, Bld: 156 mg/dL — ABNORMAL HIGH (ref 70–99)
Total Bilirubin: 0.4 mg/dL (ref 0.3–1.2)

## 2011-04-02 LAB — CBC WITH DIFFERENTIAL/PLATELET
Basophils Absolute: 0 10*3/uL (ref 0.0–0.1)
Basophils Relative: 0 % (ref 0–1)
Eosinophils Absolute: 0.1 10*3/uL (ref 0.0–0.7)
Eosinophils Relative: 1 % (ref 0–5)
MCH: 25.9 pg — ABNORMAL LOW (ref 26.0–34.0)
MCV: 79.5 fL (ref 78.0–100.0)
Neutrophils Relative %: 79 % — ABNORMAL HIGH (ref 43–77)
Platelets: 214 10*3/uL (ref 150–400)
RBC: 5.22 MIL/uL (ref 4.22–5.81)
RDW: 18.5 % — ABNORMAL HIGH (ref 11.5–15.5)
WBC: 14.6 10*3/uL — ABNORMAL HIGH (ref 4.0–10.5)

## 2011-04-02 NOTE — Telephone Encounter (Signed)
Pt states he had OV with MR on Fri., 03/30/2011 and this has been taken care of.

## 2011-04-02 NOTE — Telephone Encounter (Signed)
lmomtcb x 1.

## 2011-04-05 ENCOUNTER — Other Ambulatory Visit: Payer: Self-pay | Admitting: Licensed Clinical Social Worker

## 2011-04-05 ENCOUNTER — Other Ambulatory Visit: Payer: Self-pay | Admitting: Infectious Disease

## 2011-04-05 DIAGNOSIS — Z79899 Other long term (current) drug therapy: Secondary | ICD-10-CM

## 2011-04-05 LAB — OTHER SOLSTAS TEST

## 2011-04-06 ENCOUNTER — Telehealth: Payer: Self-pay | Admitting: Internal Medicine

## 2011-04-06 LAB — FUNGUS CULTURE W SMEAR: Fungal Smear: NONE SEEN

## 2011-04-06 NOTE — Telephone Encounter (Signed)
Ok to increase to 40 for up to 5 days but then def needs ov to regroup with Jamey Reas, NP or [employer]

## 2011-04-06 NOTE — Telephone Encounter (Signed)
[id number]

## 2011-04-06 NOTE — Telephone Encounter (Signed)
Pt aware can increase prednisone to 40 mg x 5 days. Pt is going to come in and see TP 5/23 at 9:15 to re group. Pt aware of apt date adn time

## 2011-04-06 NOTE — Telephone Encounter (Signed)
Spoke w/ pt and he states since his prednisone was changed from 40 mg to 20 mg on 5/11 he has had an increased dry cough, feels weak, tired, and has had some nausea. Pt states he had a fever yesterday but none today. Offered pt apt to see TP today but stated he couldn't. Pt is wanting to know if he can go back to prednisone 40 mg. Since MR is not in the office will forward to doc of the day for any recs. Please advise Dr. Melvyn Novas. Thanks  Allergies  Allergen Reactions  . Clarithromycin (Biaxin)     Adverse reaction w/ voriconazole  . Rifaximin (Xifaxan)     Adverse reaction w/ voriconazole     Charma Igo, CMA

## 2011-04-06 NOTE — Telephone Encounter (Signed)
Calling back

## 2011-04-08 ENCOUNTER — Encounter: Payer: Self-pay | Admitting: Internal Medicine

## 2011-04-08 NOTE — Assessment & Plan Note (Addendum)
He is feeling better. His aspergillus skin test is negative but this is done on prednisone. Plus, he meets other critieria.   PLAN Discussed disability - recommended he apply.  Discussed referral to  [facility] for transplant evaluation:given him some time to think about this. Will pursue this at followup Cut prednisone down to 73m per day Have alpha 1 anti trypsin genetic test for copd Return in 2 months Spirometry at followup If you are not feeling well with lower prednisone, come soone

## 2011-04-08 NOTE — Progress Notes (Signed)
Subjective:    Patient ID: [name], male    DOB: 1976/04/22, 35 y.o.   MRN: [id number]  HPI    Review of Systems     Objective:   Physical Exam Subjective:    Patient ID: [name], male    DOB: 1976-05-12, 35 y.o.   MRN: [id number]  HPI  35 yo  MAC with BL bronchiectasiss, Bilateral UL cavities, hemoptysis Jan 2012 due to LUL aspergilloma - s;p VATS LUL posterior segmentectomy Feb 2012. On MAC Rx since 2010 and voricanazole since Jan 2012. PFTs jan 2012 - showed Severe Obstruction   January 16, 2011. This is post hospital fu for above after dc on 12/27/2010. Very dyspneic at dicscharge even for minor activities, now getting better. Still with class 2- 3 dyspnea. Did desaturate after walking 155 feeet x 3 laps in office. Wants to know how much more he can improve. No hemoptysis since surgery - longest spell wihtout hemoptysis. Has open wound at site of former chest tube. Compliant with voricaazole and MAC Rx. Wants to know how long MAC Rx. Wants to know if hhe has to keep [facility] appt for MAc. Has now transferred care from [facility] to [facility]. No new issues.   03/01/2011  Acute office visit.  Complains over last week of increased cough/congestion and shortness of breath. Fever started 4 days ago , tmax of 104. Assoicated malaise, chills. Appetite remains low, no n/v/d. No otc  VFend stopped 2 weeks ago. Back on Rifampin for last weeks. He is followed by the ID clinic.  No hemoptysis or leg swelling. No energy , wears out easily. Cough is worse yesterday/today. >>tx w/   03/19/2011 Post Hospital  Pt returns for post hospital follow up. PT admitted for worsening cough/congestion and weakness despite outpatient tx . Pt was admitted 4/16-4/23 for slow to resolve bronchiectasis exacerbation and bilateral PNA w/ increased ASPDZ  Found to have   Allergic bronchopulmonary aspergillosis.    IgE on March 07, 2011, was SIGNIFICANTLY ELEVATED AT 4185.    Followup IgE on March 08, 4743. PCP from  a bronchioalveolar lavage was negative.  BAL showed   bronchoscopy culture shows few fungus, few Candida  albicans. AFB preliminary report is negative.   Pt was felt to have ABPA,  IgE  was markedly elevated,   The patient was started on IV steroids and voriconazole per ID recommendations.   His fevers resolved.  Pt was followed closely by ID practice during hospital stay. He was discharged on voriconazole and  MAC tx.   Since discharge he is feeling some better but still very weak. Fevers are resolve. Appetite remains low and weight continues  To trend down. 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 HE is concerned he will not be able to work due to how sick he is.    May 11. 2012: Followup visit. Since discharge has seen NP Tammy in interim. He has continued to do well. No specific complaints today. Concerned about work and questions if he should seek disability. No hemoptysis. Tolerating prednisone well. Spriometry today shows improvement   Review of Systems  Constitutional:   -  weight loss, - night sweats,   HEENT:   No headaches,  Difficulty swallowing,  Tooth/dental problems, or  Sore throat,                No sneezing, itching, ear ache, nasal congestion, post nasal  drip,   CV:  No chest pain,  Orthopnea, PND, swelling in lower extremities, anasarca, dizziness, palpitations, syncope.   GI  No heartburn, indigestion, abdominal pain, nausea, vomiting, diarrhea, change in bowel habits, loss of appetite, bloody stools.   Resp:     No coughing up of blood.   No wheezing.  No chest wall deformity  Skin: no rash or lesions.  GU: no dysuria, change in color of urine, no urgency or frequency.  No flank pain, no hematuria   MS:  No joint pain or swelling.  No decreased range of motion.  No back pain.  Psych:  No change in mood or affect. No depression or anxiety.  No memory loss.    Objective:   Physical Exam  GEN:  A/Ox3; pleasant , NAD, well nourished   HEENT:  [place]/AT,  EACs-clear, TMs-wnl, NOSE-clear, THROAT-clear, no lesions, no postnasal drip or exudate noted.   NECK:  Supple w/ fair ROM; no JVD; normal carotid impulses w/o bruits; no thyromegaly or nodules palpated; no lymphadenopathy.  RESP  Coarse BS w/ scattered rhonchi  CARD:  RRR, no m/r/g  , no peripheral edema, pulses intact, no cyanosis or clubbing.  GI:   Soft & nt; nml bowel sounds; no organomegaly or masses detected.  Musco: Warm bil, no deformities or joint swelling noted.   Neuro: alert, no focal deficits noted.    Skin: Warm, no lesions or rashes       Assessment & Plan:          Assessment & Plan:

## 2011-04-09 ENCOUNTER — Other Ambulatory Visit: Payer: BC Managed Care – PPO

## 2011-04-10 ENCOUNTER — Other Ambulatory Visit: Payer: Self-pay | Admitting: Thoracic Surgery

## 2011-04-10 DIAGNOSIS — D381 Neoplasm of uncertain behavior of trachea, bronchus and lung: Secondary | ICD-10-CM

## 2011-04-11 ENCOUNTER — Other Ambulatory Visit (INDEPENDENT_AMBULATORY_CARE_PROVIDER_SITE_OTHER): Payer: BC Managed Care – PPO

## 2011-04-11 ENCOUNTER — Ambulatory Visit
Admission: RE | Admit: 2011-04-11 | Discharge: 2011-04-11 | Disposition: A | Discharge status: Home / Self Care | Payer: BC Managed Care – PPO | Source: Ambulatory Visit | Attending: Thoracic Surgery | Admitting: Thoracic Surgery

## 2011-04-11 ENCOUNTER — Ambulatory Visit (INDEPENDENT_AMBULATORY_CARE_PROVIDER_SITE_OTHER): Payer: BC Managed Care – PPO | Admitting: Adult Health

## 2011-04-11 ENCOUNTER — Encounter: Payer: Self-pay | Admitting: Adult Health

## 2011-04-11 ENCOUNTER — Ambulatory Visit (INDEPENDENT_AMBULATORY_CARE_PROVIDER_SITE_OTHER): Payer: BC Managed Care – PPO | Admitting: Thoracic Surgery

## 2011-04-11 VITALS — BP 114/66 | HR 94 | Temp 97.1°F | Ht 68.0 in | Wt 137.0 lb

## 2011-04-11 DIAGNOSIS — J479 Bronchiectasis, uncomplicated: Secondary | ICD-10-CM

## 2011-04-11 DIAGNOSIS — B4481 Allergic bronchopulmonary aspergillosis: Secondary | ICD-10-CM

## 2011-04-11 DIAGNOSIS — A310 Pulmonary mycobacterial infection: Secondary | ICD-10-CM

## 2011-04-11 DIAGNOSIS — A429 Actinomycosis, unspecified: Secondary | ICD-10-CM

## 2011-04-11 DIAGNOSIS — D381 Neoplasm of uncertain behavior of trachea, bronchus and lung: Secondary | ICD-10-CM

## 2011-04-11 MED ORDER — PREDNISONE 20 MG PO TABS: ORAL_TABLET | ORAL | Status: DC

## 2011-04-11 NOTE — Patient Instructions (Signed)
Take Prednisone 17m daily for 2 weeks , then take 332mdaily and hold at this dose until seen back in office follow up Dr. RaChase Callern 4 weeks.  Please contact office for sooner follow up if symptoms do not improve or worsen or seek emergency care

## 2011-04-11 NOTE — Assessment & Plan Note (Signed)
No active flare at this time  Will hold on additional abx for now Appears better on increased steroid dose.  Pt to watch fever and cough cxr pending this evening.  Please contact office for sooner follow up if symptoms do not improve or worsen or seek emergency care

## 2011-04-11 NOTE — Progress Notes (Signed)
Subjective:    Patient ID: [name], male    DOB: [birth year], 35 y.o.   MRN: [id number]  HPI 35 yo MAC with BL bronchiectasiss, Bilateral UL cavities, hemoptysis Jan 2012 due to LUL aspergilloma - s;p VATS LUL posterior segmentectomy Feb 2012. On MAC Rx since 2010 and voricanazole since Jan 2012. PFTs jan 2012 - showed Severe Obstruction   January 16, 2011. This is post hospital fu for above after dc on 12/27/2010. Very dyspneic at dicscharge even for minor activities, now getting better. Still with class 2- 3 dyspnea. Did desaturate after walking 155 feeet x 3 laps in office. Wants to know how much more he can improve. No hemoptysis since surgery - longest spell wihtout hemoptysis. Has open wound at site of former chest tube. Compliant with voricaazole and MAC Rx. Wants to know how long MAC Rx. Wants to know if hhe has to keep [facility] appt for MAc. Has now transferred care from [facility] to [facility]. No new issues.   03/01/2011  Acute office visit.  Complains over last week of increased cough/congestion and shortness of breath. Fever started 4 days ago , tmax of 104. Assoicated malaise, chills. Appetite remains low, no n/v/d. No otc  VFend stopped 2 weeks ago. Back on Rifampin for last weeks. He is followed by the ID clinic.  No hemoptysis or leg swelling. No energy , wears out easily. Cough is worse yesterday/today. >>tx w/   03/19/2011 Post Hospital  Pt returns for post hospital follow up. PT admitted for worsening cough/congestion and weakness despite outpatient tx . Pt was admitted 4/16-4/23 for slow to resolve bronchiectasis exacerbation and bilateral PNA w/ increased ASPDZ  Found to have   Allergic bronchopulmonary aspergillosis.    IgE on March 07, 2011, was SIGNIFICANTLY ELEVATED AT 4185.    Followup IgE on March 08, 4743. PCP from a bronchioalveolar lavage was negative.  BAL showed   bronchoscopy culture shows few fungus, few Candida  albicans. AFB preliminary report is negative.   Pt was  felt to have ABPA,  IgE  was markedly elevated,   The patient was started on IV steroids and voriconazole per ID recommendations.   His fevers resolved.  Pt was followed closely by ID practice during hospital stay. He was discharged on voriconazole and  MAC tx.   Since discharge he is feeling some better but still very weak. Fevers are resolve. Appetite remains low and weight continues  To trend down. 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 HE is concerned he will not be able to work due to how sick he is.    May 11. 2012: Followup visit. Since discharge has seen NP Vitali Seibert in interim. He has continued to do well. No specific complaints today. Concerned about work and questions if he should seek disability. No hemoptysis. Tolerating prednisone well. Spriometry today shows improvement >>prednisone decreased 83m daily   04/11/11 Acute OV Presents for work in visit. Complains of last week he started feeling bad with increased cough, fevers and body aches last week when he went down to 270mof prednisone. He was instructed to increase prednisone 4072maily last week. Has been on for 5 days. He is feeling better.  NO hemoptysis or discolored mucus. Temps have come down since prednisone increased. He remains on azithro and ethambutol along with voriconazole by ID clinic. Has OV with Dr. BurArlyce Diceth chest xray today.  Has been feeling  some better and has went back to work part time recently.   Review of Systems Constitutional:   No  weight loss, night sweats,  +Fevers, chills, fatigue, or  lassitude.  HEENT:   No headaches,  Difficulty swallowing,  Tooth/dental problems, or  Sore throat,                No sneezing, itching, ear ache, nasal congestion, post nasal drip,   CV:  No chest pain,  Orthopnea, PND, swelling in lower extremities, anasarca, dizziness, palpitations, syncope.   GI  No heartburn, indigestion, abdominal pain, nausea,  vomiting, diarrhea, change in bowel habits, loss of appetite, bloody stools.   Resp: ,  No coughing up of blood.  No change in color of mucus.  No wheezing.  No chest wall deformity  Skin: no rash or lesions.  GU: no dysuria, change in color of urine, no urgency or frequency.  No flank pain, no hematuria   MS:  No joint pain or swelling.  No decreased range of motion.   Psych:  No change in mood or affect. No depression or anxiety.            Objective:   Physical Exam GEN: A/Ox3; pleasant , NAD, thin male   HEENT:  [place]/AT,  EACs-clear, TMs-wnl, NOSE-clear, THROAT-clear, no lesions, no postnasal drip or exudate noted.   NECK:  Supple w/ fair ROM; no JVD; normal carotid impulses w/o bruits; no thyromegaly or nodules palpated; no lymphadenopathy.  RESP  Coarse BS w/ few rhonchi,  No wheezes.no accessory muscle use, no dullness to percussion  CARD:  RRR, no m/r/g  , no peripheral edema, pulses intact, no cyanosis or clubbing.  GI:   Soft & nt; nml bowel sounds; no organomegaly or masses detected.  Musco: Warm bil, no deformities or joint swelling noted.   Neuro: alert, no focal deficits noted.    Skin: Warm, no lesions or rashes         Assessment & Plan:

## 2011-04-11 NOTE — Assessment & Plan Note (Addendum)
Flare in symptoms with lower dose of steroids   Plan;  Take Prednisone 26m daily for 2 weeks , then take 372mdaily and hold at this dose until seen back in office follow up Dr. RaChase Callern 4 weeks.  Please contact office for sooner follow up if symptoms do not improve or worsen or seek emergency care

## 2011-04-12 NOTE — Assessment & Plan Note (Signed)
OFFICE VISIT  [name], [name] DOB:  1976-01-24                                        Apr 11, 2011 CHART #:  [id number]  HISTORY:  The patient comes in today for 6-week followup.  He is status post a left thoracotomy with resection of the apical posterior segment of the left upper lobe for mycetoma.  He had been doing well until mid April at which time he was rehospitalized for pulmonary aspergillosis. He was discharged home on March 12, 2011, and has continued to follow up with Dr. Tommy Medal in the Infectious Disease Clinic.  He continues on fluconazole and is followed regularly in their office.  He also has seen [name] in the last few weeks.  They previously tried to decrease his dosage of prednisone but he did not tolerate this and was put back on 40 mg daily.  He saw nurse practitioner this week and will follow up in the next week and half or so with [name] again for reevaluation.  From a pulmonary standpoint, he has been relatively stable although he does have some chronic shortness of breath and nonproductive cough.  From a postsurgical standpoint, he is progressing well.  He still has some occasional soreness with activity but overall is improving.  PHYSICAL EXAMINATION:  His left thoracotomy and chest tube scars have all healed well.  Heart is regular rate and rhythm.  Lungs are clear to auscultation.  IMAGING:  Chest x-ray shows postsurgical changes on the left and changes bilaterally secondary to his aspergillosis and MAC.  ASSESSMENT/PLAN:  The patient is progressing well status post left video- assisted thoracic surgery.  Dr. Arlyce Dice saw the patient today and reviewed his films.  We will plan on seeing him back in 2 months with a repeat chest x-ray.  He will follow up as directed with Dr. Tommy Medal and [name].  He will call our office in the interim if he experiences any further problems from a surgical standpoint.  Suzzanne Cloud, P.A.  GC/MEDQ  D:  04/11/2011  T:  04/12/2011  Job:  [id number]  cc:   Brand Males, MD Alcide Evener, MD

## 2011-04-18 ENCOUNTER — Ambulatory Visit: Payer: BC Managed Care – PPO | Admitting: Infectious Disease

## 2011-04-22 LAB — AFB CULTURE WITH SMEAR (NOT AT ARMC): Acid Fast Smear: NONE SEEN

## 2011-04-23 LAB — OTHER SOLSTAS TEST

## 2011-04-24 ENCOUNTER — Encounter: Payer: Self-pay | Admitting: Internal Medicine

## 2011-04-30 ENCOUNTER — Ambulatory Visit: Payer: BC Managed Care – PPO | Admitting: Infectious Disease

## 2011-04-30 ENCOUNTER — Encounter: Payer: Self-pay | Admitting: Internal Medicine

## 2011-05-07 ENCOUNTER — Telehealth: Payer: Self-pay | Admitting: Internal Medicine

## 2011-05-07 MED ORDER — BUDESONIDE-FORMOTEROL FUMARATE 80-4.5 MCG/ACT IN AERO: 2.0000 | INHALATION_SPRAY | Freq: Two times a day (BID) | RESPIRATORY_TRACT | Status: DC

## 2011-05-07 NOTE — Telephone Encounter (Signed)
Pt has upcoming appt w/ MR on 6.21.12 - refills sent as requested.  Called spoke with patient, he is aware.

## 2011-05-09 ENCOUNTER — Ambulatory Visit: Payer: BC Managed Care – PPO | Admitting: Internal Medicine

## 2011-05-10 ENCOUNTER — Encounter: Payer: Self-pay | Admitting: Internal Medicine

## 2011-05-10 ENCOUNTER — Ambulatory Visit (INDEPENDENT_AMBULATORY_CARE_PROVIDER_SITE_OTHER): Payer: BC Managed Care – PPO | Admitting: Internal Medicine

## 2011-05-10 VITALS — BP 122/80 | HR 90 | Temp 98.0°F | Ht 69.0 in | Wt 139.0 lb

## 2011-05-10 DIAGNOSIS — A318 Other mycobacterial infections: Secondary | ICD-10-CM

## 2011-05-10 DIAGNOSIS — B4481 Allergic bronchopulmonary aspergillosis: Secondary | ICD-10-CM

## 2011-05-10 DIAGNOSIS — R05 Cough: Secondary | ICD-10-CM

## 2011-05-10 DIAGNOSIS — B449 Aspergillosis, unspecified: Secondary | ICD-10-CM

## 2011-05-10 MED ORDER — DOXYCYCLINE HYCLATE 100 MG PO TABS: 100.0000 mg | ORAL_TABLET | Freq: Two times a day (BID) | ORAL | Status: AC

## 2011-05-10 NOTE — Progress Notes (Signed)
Subjective:    Patient ID: [name], male    DOB: October 24, 1976, 35 y.o.   MRN: [id number]  HPI BRIEF: Korean-American. MAC with BL bronchiectasiss, Bilateral UL cavities since 2009/2010 - on MAC Rx. Severe obstruction on PFTs - Jan 2012. LUL aspergilloma and Submassive hemoptysis - s/p VATS LUL posterior segmentectomy in Feb 2012.  S/p  voricanazole for aspergilloma Jan-Mar 2012. 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 Autoimmune panel negative April 2012. Alpha 1 MM phenotype - May 2012. Dramatic clinical response of fever/SIRS to steroids and restart voricanazole - April 2012     OV May 11. 2012: Followup visit. Since discharge has seen NP Tammy in interim. He has continued to do well. No specific complaints today. Concerned about work and questions if he should seek disability. No hemoptysis. Tolerating prednisone well. Spriometry today shows improvement - fev1 is 1.83L/45% predicted. ALPHA 1 CHECKED- MM GENOTYPE. PLAN: REDUCE prednisone to 29m daily   04/11/11 Acute OV: Presents for work in visit. Complains of last week he started feeling bad with increased cough, fevers and body aches last week when he went down to 263mof prednisone. He was instructed to increase prednisone 4050maily last week. Has been on for 5 days. He is feeling better.  NO hemoptysis or discolored mucus. Temps have come down since prednisone increased. He remains on azithro and ethambutol along with voriconazole by ID clinic. Has OV with Dr. BurArlyce Diceth chest xray today that showed no change. Has been feeling some better and has went back to work part time recently. REC: PREDNISONE BACK TO 10m64mr day and then reduce to 30mg75m day. CONSIDER 2nd OPINION [facility]: FOLLOWUP OV. Back to work.  Doing manual work. Sweating profusely with exertion. Did not apply for disability  -states compensation is very poor and he cannot 'afford disability'.  He did well with prednisone 10mg 42m increased 1 month ago. FEvers, cough, sputum, energy levels all resolved. But past few weeks down to 30mg p65may. Feels even 30mg pe57my is not adequate for him. Past 5 days reporting slight increase in dyspnea, cough, sputum volume and change in color to yellow. Otherwise well. Spirometry shows worsening of Fev1 to 1.5L.36% (clearly worse compared to pred 10mg dai35m Cotinues with MAC and ABPA Rx - voricanazole. [facility]    Review of Systems  Constitutional: Negative for fever and unexpected weight change.  HENT: Negative for ear pain, nosebleeds, congestion, sore throat, rhinorrhea, sneezing, trouble swallowing, dental problem, postnasal drip and sinus pressure.   Eyes: Negative for redness and itching.  Respiratory: Positive for cough and shortness of breath. Negative for chest tightness and wheezing.   Cardiovascular: Negative for palpitations and leg swelling.  Gastrointestinal: Negative for nausea and vomiting.  Genitourinary: Negative for dysuria.  Musculoskeletal: Negative for joint swelling.  Skin: Negative for rash.  Neurological: Negative for headaches.  Hematological: Does not bruise/bleed easily.  Psychiatric/Behavioral: Negative for dysphoric mood. The patient is not nervous/anxious.        Objective:   Physical Exam    GEN: A/Ox3; pleasant , NAD, thin male   HEENT:  [place]/AT,  EACs-clear, TMs-wnl, NOSE-clear, THROAT-clear, no lesions, no postnasal drip or exudate noted.  NECK:  Supple w/ fair ROM; no JVD; normal carotid impulses w/o bruits; no thyromegaly or nodules palpated; no lymphadenopathy.  RESP  Coarse BS w/ few rhonchi,  No wheezes.no accessory muscle use, no dullness to percussion  CARD:  RRR, no m/r/g  , no peripheral edema, pulses intact,  no cyanosis or clubbing.  GI:   Soft & nt; nml bowel sounds; no organomegaly or masses detected.  Musco: Warm bil, no deformities or joint swelling noted.   Neuro: alert, no focal deficits noted.    Skin: Warm, no lesions or rashes    Assessment & Plan:

## 2011-05-10 NOTE — Patient Instructions (Addendum)
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

## 2011-05-12 ENCOUNTER — Encounter: Payer: Self-pay | Admitting: Internal Medicine

## 2011-05-12 NOTE — Assessment & Plan Note (Deleted)
MAC with BL bronchiectasiss, Bilateral UL cavities, hemoptysis Jan 2012 due to LUL aspergilloma - s;p VATS LUL posterior segmentectomy Feb 2012. On MAC Rx since 2010 and voricanazole since Jan 2012. PFTs jan 2012 - showed Severe Obstruction VFEND stopped 02/2011 . 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 Vfed and steroids rstared April 2012. Does not tolerated pred taper below 53m per day - June 2012.

## 2011-05-12 NOTE — Assessment & Plan Note (Signed)
MAC with BL bronchiectasiss and bilateral UL cavity- on MAC Rx since 2010, Bilateral UL cavities, hemoptysis Jan 2012 due to LUL aspergilloma - s/p VATS LUL posterior segmentectomy - Feb 2012. S/p VFEND Feb-Mar 2012. Vfend and steroids rstared April 2012 due to development of ABPA. Does not tolerated pred taper below 66m per day - June 2012.LAtes CXR May 2012 does not show evidence of recurrence of aspergilloma or new development on rUL cavity either

## 2011-05-12 NOTE — Assessment & Plan Note (Addendum)
MAC with BL bronchiectasiss, Bilateral UL cavities, hemoptysis Jan 2012 due to LUL aspergilloma - s;p VATS LUL posterior segmentectomy Feb 2012. On MAC Rx since 2010 . BAL LUL April 2012 - no AFB on culture  PLAN Per ID

## 2011-05-12 NOTE — Assessment & Plan Note (Addendum)
New dx 03/12/11 - diagnosed when he developed SIRS with high fever 103F after dc of voricanazole, not responding to antibiotics  Bilateral UL bronchiectasis + Total Ig E  - 4185 IU/mL, 4744 IU/mL on 03/07/2011 and 03/08/2011 respectively   Aspergillus serum galatcomannan assay - 0.4 - negative on 03/06/2011 Aspergillus antibody via complement fixatin test -1:256 on 03/08/2011   Aspergillus IgE skin test - negative (histamine control test was positive) 03/30/2011 (done on prednisone for several weeks)  COURSE - Start systemic steroids and restart voricanazole mid-April 2012 - Recurrence of symptoms May 2012 when prednisone tapered to 77m per day  CURRENT Though at prednisone 372mper day, spirometry is somewhat worse compared to when he was taking prednisone 4038mer day Therefore, will INcrease prednisone to 81m76mr day May 10, 2011 [facility]6 [facility]ee[facility] with spirometry here

## 2011-05-21 ENCOUNTER — Other Ambulatory Visit: Payer: BC Managed Care – PPO

## 2011-05-21 ENCOUNTER — Other Ambulatory Visit: Payer: Self-pay

## 2011-05-21 DIAGNOSIS — Z79899 Other long term (current) drug therapy: Secondary | ICD-10-CM

## 2011-05-21 DIAGNOSIS — Z113 Encounter for screening for infections with a predominantly sexual mode of transmission: Secondary | ICD-10-CM

## 2011-05-21 DIAGNOSIS — B20 Human immunodeficiency virus [HIV] disease: Secondary | ICD-10-CM

## 2011-05-28 ENCOUNTER — Other Ambulatory Visit: Payer: Self-pay | Admitting: Infectious Disease

## 2011-05-28 ENCOUNTER — Other Ambulatory Visit (INDEPENDENT_AMBULATORY_CARE_PROVIDER_SITE_OTHER): Payer: BC Managed Care – PPO

## 2011-05-28 DIAGNOSIS — Z113 Encounter for screening for infections with a predominantly sexual mode of transmission: Secondary | ICD-10-CM

## 2011-05-28 DIAGNOSIS — B20 Human immunodeficiency virus [HIV] disease: Secondary | ICD-10-CM

## 2011-05-28 DIAGNOSIS — Z79899 Other long term (current) drug therapy: Secondary | ICD-10-CM

## 2011-05-28 LAB — CBC WITH DIFFERENTIAL/PLATELET
Lymphocytes Relative: 27 % (ref 12–46)
Lymphs Abs: 2.7 10*3/uL (ref 0.7–4.0)
MCV: 85.2 fL (ref 78.0–100.0)
Neutrophils Relative %: 65 % (ref 43–77)
Platelets: 255 10*3/uL (ref 150–400)
RBC: 5.74 MIL/uL (ref 4.22–5.81)
WBC: 10 10*3/uL (ref 4.0–10.5)

## 2011-05-28 LAB — RPR

## 2011-05-28 LAB — LIPID PANEL
HDL: 92 mg/dL (ref >39)
LDL Cholesterol: 56 mg/dL (ref 0–99)

## 2011-05-29 ENCOUNTER — Other Ambulatory Visit: Payer: BC Managed Care – PPO

## 2011-05-29 LAB — URINALYSIS, ROUTINE W REFLEX MICROSCOPIC
Bilirubin Urine: NEGATIVE
Ketones, ur: NEGATIVE mg/dL
Nitrite: NEGATIVE
Specific Gravity, Urine: 1.021 (ref 1.005–1.030)
pH: 6 (ref 5.0–8.0)

## 2011-05-29 LAB — COMPLETE METABOLIC PANEL WITH GFR
ALT: 40 U/L (ref 0–53)
CO2: 29 mEq/L (ref 19–32)
Calcium: 9.5 mg/dL (ref 8.4–10.5)
Chloride: 102 mEq/L (ref 96–112)
GFR, Est African American: >60 mL/min (ref >60)
Potassium: 3.8 mEq/L (ref 3.5–5.3)
Sodium: 140 mEq/L (ref 135–145)
Total Protein: 6.4 g/dL (ref 6.0–8.3)

## 2011-05-29 LAB — GC/CHLAMYDIA PROBE AMP, URINE: Chlamydia, Swab/Urine, PCR: NEGATIVE

## 2011-05-29 LAB — T-HELPER CELL (CD4) - (RCID CLINIC ONLY): CD4 T Cell Abs: 1520 uL (ref 400–2700)

## 2011-06-07 ENCOUNTER — Ambulatory Visit: Payer: BC Managed Care – PPO | Admitting: Adult Health

## 2011-06-08 ENCOUNTER — Ambulatory Visit: Payer: BC Managed Care – PPO | Admitting: Infectious Disease

## 2011-06-12 ENCOUNTER — Other Ambulatory Visit: Payer: Self-pay | Admitting: Thoracic Surgery

## 2011-06-12 DIAGNOSIS — C3410 Malignant neoplasm of upper lobe, unspecified bronchus or lung: Secondary | ICD-10-CM

## 2011-06-13 ENCOUNTER — Ambulatory Visit (INDEPENDENT_AMBULATORY_CARE_PROVIDER_SITE_OTHER): Payer: BC Managed Care – PPO | Admitting: Thoracic Surgery

## 2011-06-13 ENCOUNTER — Ambulatory Visit: Payer: BC Managed Care – PPO | Admitting: Infectious Disease

## 2011-06-13 ENCOUNTER — Ambulatory Visit
Admission: RE | Admit: 2011-06-13 | Discharge: 2011-06-13 | Disposition: A | Discharge status: Home / Self Care | Payer: BC Managed Care – PPO | Source: Ambulatory Visit | Attending: Thoracic Surgery | Admitting: Thoracic Surgery

## 2011-06-13 DIAGNOSIS — C3410 Malignant neoplasm of upper lobe, unspecified bronchus or lung: Secondary | ICD-10-CM

## 2011-06-13 DIAGNOSIS — B449 Aspergillosis, unspecified: Secondary | ICD-10-CM

## 2011-06-15 NOTE — Assessment & Plan Note (Signed)
OFFICE VISIT  LARSON, LIMONES DOB:  October 17, 1976                                        June 13, 2011 CHART #:  [id number]  The patient came today.  His chest x-ray is stable.  He has been referred to [facility] for further evaluation.  His blood pressure is 120/77, pulse 68, respirations 16, sats were 98%.  He is in cardiac rehab three times a week.  His chest x-ray is really stable, although there is some increased nodularity which would go along with more of his MAI disease. No more evidence of any fungal balls at his right upper lobe lesion.  I will see him back again if he has any future problems.  Nicanor Alcon, M.D. Electronically Signed  DPB/MEDQ  D:  06/13/2011  T:  06/14/2011  Job:  [id number]

## 2011-06-18 ENCOUNTER — Telehealth: Payer: Self-pay | Admitting: Internal Medicine

## 2011-06-18 MED ORDER — PREDNISONE 20 MG PO TABS: ORAL_TABLET | ORAL | Status: DC

## 2011-06-18 NOTE — Telephone Encounter (Signed)
Refill sent. Pt aware.[place] Bing, CMA

## 2011-06-27 ENCOUNTER — Encounter: Payer: Self-pay | Admitting: Internal Medicine

## 2011-06-27 ENCOUNTER — Ambulatory Visit (INDEPENDENT_AMBULATORY_CARE_PROVIDER_SITE_OTHER): Payer: BC Managed Care – PPO | Admitting: Internal Medicine

## 2011-06-27 ENCOUNTER — Encounter: Payer: Self-pay | Admitting: Infectious Disease

## 2011-06-27 ENCOUNTER — Ambulatory Visit (INDEPENDENT_AMBULATORY_CARE_PROVIDER_SITE_OTHER): Payer: BC Managed Care – PPO | Admitting: Infectious Disease

## 2011-06-27 VITALS — BP 110/78 | HR 87 | Temp 98.3°F | Ht 69.0 in | Wt 138.6 lb

## 2011-06-27 DIAGNOSIS — A310 Pulmonary mycobacterial infection: Secondary | ICD-10-CM

## 2011-06-27 DIAGNOSIS — B4481 Allergic bronchopulmonary aspergillosis: Secondary | ICD-10-CM

## 2011-06-27 DIAGNOSIS — A318 Other mycobacterial infections: Secondary | ICD-10-CM

## 2011-06-27 DIAGNOSIS — B449 Aspergillosis, unspecified: Secondary | ICD-10-CM

## 2011-06-27 NOTE — Assessment & Plan Note (Signed)
Continue voriconazole. Continue prednisone. [name] is titrating the dose. I'll see him back in a few months time.

## 2011-06-27 NOTE — Assessment & Plan Note (Signed)
Status post resection by Dr. Jearld Fenton

## 2011-06-27 NOTE — Patient Instructions (Addendum)
Decrease prednisone down to 39m daily [facility] latest thoughts and plan Based on that, next followup date Return or call sooner if new or worsening problems

## 2011-06-27 NOTE — Progress Notes (Signed)
Subjective:    Patient ID: [name], male    DOB: 1976/06/16, 35 y.o.   MRN: [id number]  HPI  [place]u ABPA, MAC, Aspergilloma   OV 06/27/2011: Tolerating prednisone 4m daily well. Feels asymptomatic or minimal symptoms. Compliant with meds. 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 [facility] - he is considering sarcoid. Possibly sent off ACE. He needs more info from [place] I am not sure he has gotten. Fev1 today is 1.5L/38% and unchanged from last visit and no difference from 367mprednisone per day. He has been advised per Dr. GoCorrin Parkero reduce prednisone dosage which I agree with. Saw Dr VaWendie Agrestef I Dand been asked to continue Mac Rx. No further hemoptysis   Past Medical History  Diagnosis Date  . Aspergilloma   . MAI (mycobacterium avium-intracellulare)   . Lung disease, bullous   . Esophageal reflux   . Hypothyroidism     secondary to ablation for Graves disease     Family History  Problem Relation Age of Onset  . Adopted: Yes     History   Social History  . Marital Status: Single    Spouse Name: N/A    Number of Children: N/A  . Years of Education: N/A   Occupational History  . works 2 jobs    Social History Main Topics  . Smoking status: Former Smoker -- 0.7 packs/day for 19 years    Types: Cigarettes    Quit date: 11/20/2007  . Smokeless tobacco: Former [employer] developer  Types: Snuff    Quit date: 11/19/1994  . Alcohol Use: No  . Drug Use: No  . Sexually Active: Not on file   Other Topics Concern  . Not on file   Social History Narrative   Pt is adopted     Allergies  Allergen Reactions  . Clarithromycin (Biaxin)     Adverse reaction w/ voriconazole  . Rifaximin (Xifaxan)     Adverse reaction w/ voriconazole     Outpatient Prescriptions Prior to Visit  Medication Sig Dispense Refill  . ethambutol (MYAMBUTOL) 400 MG tablet Take 1,000 mg by mouth daily. (2.5 tablets daily)       . ferrous sulfate  (FERRO-BOB) 325 (65 FE) MG tablet Take 325 mg by mouth daily.        . Marland Kitchenevothyroxine (SYNTHROID, LEVOTHROID) 137 MCG tablet Take 137 mcg by mouth daily.        . predniSONE (DELTASONE) 20 MG tablet Take 409ms directed  60 tablet  0  . tiotropium (SPIRIVA HANDIHALER) 18 MCG inhalation capsule Place 18 mcg into inhaler and inhale daily.        . vMarland Kitchenriconazole (VFEND) 200 MG tablet Take 200 mg by mouth 2 (two) times daily.       . budesonide-formoterol (SYMBICORT) 80-4.5 MCG/ACT inhaler Inhale 2 puffs into the lungs 2 (two) times daily.  1 Inhaler  6  . azithromycin (ZITHROMAX) 500 MG tablet Take 500 mg by mouth daily.           Review of Systems  Constitutional: Negative for fever and unexpected weight change.  HENT: Negative for ear pain, nosebleeds, congestion, sore throat, rhinorrhea, sneezing, trouble swallowing, dental problem, postnasal drip and sinus pressure.   Eyes: Negative for redness and itching.  Respiratory: Positive for cough and shortness of breath. Negative for chest tightness and wheezing.   Cardiovascular: Negative for palpitations and leg swelling.  Gastrointestinal: Negative for nausea and vomiting.  Genitourinary: Negative for dysuria.  Musculoskeletal: Negative for joint swelling.  Skin: Negative for rash.  Neurological: Negative for headaches.  Hematological: Does not bruise/bleed easily.  Psychiatric/Behavioral: Negative for dysphoric mood. The patient is not nervous/anxious.        Objective:   Physical Exam GEN: A/Ox3; pleasant , NAD, thin male   HEENT:  [place]/AT,  EACs-clear, TMs-wnl, NOSE-clear, THROAT-clear, no lesions, no postnasal drip or exudate noted.   NECK:  Supple w/ fair ROM; no JVD; normal carotid impulses w/o bruits; no thyromegaly or nodules palpated; no lymphadenopathy.  RESP  Coarse BS . No rhonchi,  No wheezes.no accessory muscle use, no dullness to percussion  CARD:  RRR, no m/r/g  , no peripheral edema, pulses intact, no cyanosis or  clubbing.  GI:   Soft & nt; nml bowel sounds; no organomegaly or masses detected.  Musco: Warm bil, no deformities or joint swelling noted.   Neuro: alert, no focal deficits noted.    Skin: Warm, no lesions or rashes           Assessment & Plan:

## 2011-06-27 NOTE — Progress Notes (Signed)
Subjective:    Patient ID: [name], male    DOB: March 14, 1976, 35 y.o.   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 He is currently on voriconazole 200 mg twice daily as well as prednisone which he has required up to 40 mg to maintain his symptoms. He remains also unchanged on azithromycin to treat my Bactrim 8 infection. He has been evaluated at [facility] where they've worked him up for a second opinion apparently they have also entertain the diagnosis of sarcoidosis. Patient claims to be doing well he states that his pulmonary function tests were worse however when he was at [facility] reason why. He continues to have some sputum production in the morning but generally feels better after increase in his quarter steroids at 40 mg per day. His voriconazole level and there measured here in clinic was 7 when checked in June. He has no other specific complaints today.  Review of Systems  Constitutional: Negative for fever, chills, diaphoresis, activity change, appetite change, fatigue and unexpected weight change.  HENT: Negative for congestion, sore throat, rhinorrhea, sneezing, trouble swallowing and sinus pressure.   Eyes: Negative for photophobia and visual disturbance.  Respiratory: Negative for cough, chest tightness, shortness of breath, wheezing and stridor.   Cardiovascular: Negative for chest pain, palpitations and leg swelling.  Gastrointestinal: Negative for nausea, vomiting, abdominal pain, diarrhea, constipation, blood in stool, abdominal distention and anal bleeding.  Genitourinary: Negative for dysuria, hematuria, flank pain and difficulty urinating.  Musculoskeletal: Negative for myalgias, back pain, joint swelling, arthralgias and gait problem.  Skin: Negative for color change,  pallor, rash and wound.  Neurological: Negative for dizziness, tremors, weakness and light-headedness.  Hematological: Negative for adenopathy. Does not bruise/bleed easily.  Psychiatric/Behavioral: Negative for behavioral problems, confusion, sleep disturbance, dysphoric mood, decreased concentration and agitation.       Objective:   Physical Exam  Constitutional: He is oriented to person, place, and time. He appears well-developed and well-nourished. No distress.  HENT:  Head: Normocephalic and atraumatic.  Mouth/Throat: Oropharynx is clear and moist. No oropharyngeal exudate.  Eyes: Conjunctivae and EOM are normal. Pupils are equal, round, and reactive to light. No scleral icterus.  Neck: Normal range of motion. Neck supple. No JVD present.  Cardiovascular: Normal rate, regular rhythm and normal heart sounds.  Exam reveals no gallop and no friction rub.   No murmur heard. Pulmonary/Chest: Effort normal. No respiratory distress. He has wheezes. He has no rales. He exhibits no tenderness.       Prolonged expiratory phase  Abdominal: He exhibits no distension and no mass. There is no tenderness. There is no rebound and no guarding.  Musculoskeletal: He exhibits no edema and no tenderness.  Lymphadenopathy:    He has no cervical adenopathy.  Neurological: He is alert and oriented to person, place, and time. He has normal reflexes. He exhibits normal muscle tone. Coordination normal.  Skin: Skin is warm and dry. He is not diaphoretic. No erythema. No pallor.  Psychiatric: He has a normal mood and affect. His behavior is normal. Judgment and thought content normal.          Assessment & Plan:  ABPA (allergic bronchopulmonary aspergillosis) Continue voriconazole. Continue prednisone. [name] is titrating the dose. I'll see him back in a few months time.  Aspergilloma Status post resection by  Dr. Jearld Fenton  Mycobacterium avium infection Continue ethambutol and azithromycin for  now. His sputum cultures were negative in November Will recheck cultures in November. I have not added back the rifampin and due to the complicating drug interaction this as with voriconazole.

## 2011-06-27 NOTE — Assessment & Plan Note (Signed)
Continue ethambutol and azithromycin for now. His sputum cultures were negative in November Will recheck cultures in November. I have not added back the rifampin and due to the complicating drug interaction this as with voriconazole.

## 2011-06-28 ENCOUNTER — Encounter: Payer: Self-pay | Admitting: Internal Medicine

## 2011-06-28 ENCOUNTER — Telehealth: Payer: Self-pay | Admitting: Internal Medicine

## 2011-06-28 NOTE — Assessment & Plan Note (Signed)
MAC with BL bronchiectasiss, Bilateral UL cavities, hemoptysis Jan 2012 due to LUL aspergilloma - s;p VATS LUL posterior segmentectomy Feb 2012. On MAC Rx since 2010 .   BAL LUL April 2012 - no AFB on culture. Dr Anthoney Harada  - August 2012: Continued Rx without rifampin due to its intereaction with voriconazole per Dr. Wendie Agreste of ID

## 2011-06-28 NOTE — Assessment & Plan Note (Signed)
MAC with BL bronchiectasiss and bilateral UL cavity- on MAC Rx since 2010, Bilateral UL cavities, hemoptysis Jan 2012 due to LUL aspergilloma - s/p VATS LUL posterior segmentectomy - Feb 2012. S/p VFEND Feb-Mar 2012. Vfend and steroids rstared April 2012 due to development of ABPA.  No further hemoptysis on 06/27/2011 visit. Will continue to monitor

## 2011-06-28 NOTE — Assessment & Plan Note (Addendum)
New dx 03/12/11 - diagnosed when he developed SIRS with high fever 103F after dc of voricanazole, not responding to antibiotics  Bilateral UL bronchiectasis + Total Ig E  - 4185 IU/mL, 4744 IU/mL on 03/07/2011 and 03/08/2011 respectively  (autoimmune profile negative) Aspergillus serum galatcomannan assay - 0.4 - negative on 03/06/2011 Aspergillus antibody via complement fixatin test -1:256 on 03/08/2011   Aspergillus speciec on BAL LUL - apr 2012 Aspergillus IgE skin test - negative (histamine control test was positive) 03/30/2011 (done on prednisone for several weeks)  COURSE SO FAR - Started systemic steroids and restart voricanazole mid-April 2012 - Recurrence of symptoms May 2012 when prednisone tapered to 27m per day - INcreased prednisone to 424mper day May 10, 2011  PLAN TODAY 06/27/2011 - Fev1 is 1.58L/38% and is unchanged on prednisone 4041mer day compared to 7m27mr day. He is feeling well. WE discussd concerns about higher doses of prednisone. Therefore, will reduce prednisone to 7mg34m day and continue to monitor. Suspect 1.5L/38% is personal best - I will get my note to Dr. GoverCorrin Parkeris considering sarcoidosis and I suspect has sent blood for ACE which we have not tested so far - I will await papers from Dr. GoverCorrin Parkered to make sure Dr. GoverCorrin Parker all the info he needs - Followup: will space it out so it gives him time to complete workup with Dr. GoverCorrin Parker

## 2011-06-28 NOTE — Telephone Encounter (Signed)
Please send my note to Dr Corrin Parker [facility] and confirme when done Please check with their office if they need any other info Please give patient appt in 3 months with spirometry at return

## 2011-07-09 NOTE — Telephone Encounter (Signed)
OV note faxed. Reminder placed for 3 month appt with spirometry.  LM to advise of not being faxed. [place] Bing, CMA

## 2011-07-10 ENCOUNTER — Telehealth: Payer: Self-pay | Admitting: Internal Medicine

## 2011-07-10 NOTE — Telephone Encounter (Signed)
Delsa Sale  Sent you email. Please call path department [phone] (? [facility] should do this) but please have lung biopsy slide from time of surgery late last year/early this year sent to Dr. Corrin Parker below. I sent you email about this but now I am at computer and sending you as phone message  Thanks MR ........................................Marland Kitchen     Syeda Prickett: (dated today) Thanks for facilitating!!  Ms. Harvest Dark, is there any way that you could facilitate mr. Plaut's pathology being sent to:  Cresenciano Lick, MD [facility] center [place], [place] [address] Thanks. Joe govert

## 2011-07-12 ENCOUNTER — Telehealth: Payer: Self-pay | Admitting: Internal Medicine

## 2011-07-12 MED ORDER — AZITHROMYCIN 500 MG PO TABS: 500.0000 mg | ORAL_TABLET | Freq: Every day | ORAL | Status: DC

## 2011-07-12 NOTE — Telephone Encounter (Signed)
Pt is on azithromycin daily for MAC refills sent. Pt aware.[place] Bing, CMA

## 2011-07-12 NOTE — Telephone Encounter (Signed)
MR pt--Pt states Azithromycin is part of his MAC treatment and was given same while in the hospital and has been told he can not stop until 1 year of negative sputum results and first negative result was 09/2010. Pt states has only 1 tab left. MR is out of the office today and will send to "doc of the day" Dr. Annamaria Boots please advise. Thanks. Miki Kins, CMA   Allergies  Allergen Reactions  . Clarithromycin (Biaxin)     Adverse reaction w/ voriconazole  . Rifaximin (Xifaxan)     Adverse reaction w/ voriconazole

## 2011-07-12 NOTE — Telephone Encounter (Signed)
Per CY-ok to refill till next apt with MR.

## 2011-07-13 NOTE — Telephone Encounter (Signed)
I have tried to contact Dr. Kathie Rhodes office several times. I need the [employer] billing number for his office in order to send the slides to Dr. Corrin Parker.  I LMTCBx1 with Butch Penny his Environmental consultant. Will await call back. I did already call [facility] and provided the info I did have and they are going to start preparing the slides to be shipped and I will all them with [employer] info once I receive it.  [place] Bing, CMA

## 2011-07-16 NOTE — Telephone Encounter (Signed)
I spoke with Dr. Corrin Parker assistant and she states she spoke with pathology this morning and provided them with the fed-ex number. They are shipping the slides today. I have emailed Dr. Corrin Parker with this info. [place] Bing, CMA

## 2011-07-17 ENCOUNTER — Other Ambulatory Visit: Payer: Self-pay | Admitting: Internal Medicine

## 2011-07-17 ENCOUNTER — Telehealth: Payer: Self-pay | Admitting: Internal Medicine

## 2011-07-17 NOTE — Telephone Encounter (Signed)
lmomtcb to verify medication needed to send

## 2011-07-18 NOTE — Telephone Encounter (Signed)
lmomtcb  

## 2011-07-19 MED ORDER — PREDNISONE 20 MG PO TABS: ORAL_TABLET | ORAL | Status: DC

## 2011-07-19 NOTE — Telephone Encounter (Signed)
PATIENT RETURNED CALL AGAIN.  PLEASE CALL BACK AT [phone].  HE SAYS MEDICATION IS PREDNISONE 20 MG TABLETS, BUT HE TAKES 1.5 TABLETS PER DAY.

## 2011-07-19 NOTE — Telephone Encounter (Signed)
Pt returning call.[name]

## 2011-07-19 NOTE — Telephone Encounter (Signed)
Pt aware rx was sent to pharmacy

## 2011-09-11 ENCOUNTER — Telehealth: Payer: Self-pay | Admitting: Internal Medicine

## 2011-09-11 MED ORDER — ETHAMBUTOL HCL 400 MG PO TABS: 1000.0000 mg | ORAL_TABLET | Freq: Every day | ORAL | Status: DC

## 2011-09-11 NOTE — Telephone Encounter (Signed)
Refill sent. Pharmacy verified. Pt aware.[place] Bing, CMA

## 2011-09-24 ENCOUNTER — Telehealth: Payer: Self-pay | Admitting: Internal Medicine

## 2011-09-24 MED ORDER — PREDNISONE 20 MG PO TABS: ORAL_TABLET | ORAL | Status: DC

## 2011-09-24 NOTE — Telephone Encounter (Signed)
Refill sent. Pt aware.Manly Nestle, CMA  

## 2011-10-15 ENCOUNTER — Ambulatory Visit: Payer: BC Managed Care – PPO | Admitting: Internal Medicine

## 2011-10-16 ENCOUNTER — Telehealth: Payer: Self-pay | Admitting: Internal Medicine

## 2011-10-16 NOTE — Telephone Encounter (Signed)
I cannot remember if I sent a phone note on him or not but [facility] doctors were wondering about him and I noted he has not seen me since May 2012. He needs fu with me

## 2011-10-17 NOTE — Telephone Encounter (Signed)
Pt has an appt on 10-29-11. [place] Bing, CMA

## 2011-10-29 ENCOUNTER — Encounter: Payer: Self-pay | Admitting: Internal Medicine

## 2011-10-29 ENCOUNTER — Ambulatory Visit (INDEPENDENT_AMBULATORY_CARE_PROVIDER_SITE_OTHER): Payer: BC Managed Care – PPO | Admitting: Internal Medicine

## 2011-10-29 VITALS — BP 150/86 | HR 85 | Temp 98.7°F | Ht 69.0 in | Wt 142.2 lb

## 2011-10-29 DIAGNOSIS — A318 Other mycobacterial infections: Secondary | ICD-10-CM

## 2011-10-29 DIAGNOSIS — B449 Aspergillosis, unspecified: Secondary | ICD-10-CM

## 2011-10-29 DIAGNOSIS — A310 Pulmonary mycobacterial infection: Secondary | ICD-10-CM

## 2011-10-29 DIAGNOSIS — Z23 Encounter for immunization: Secondary | ICD-10-CM

## 2011-10-29 DIAGNOSIS — B4481 Allergic bronchopulmonary aspergillosis: Secondary | ICD-10-CM

## 2011-10-29 MED ORDER — TIOTROPIUM BROMIDE MONOHYDRATE 18 MCG IN CAPS: 18.0000 ug | ORAL_CAPSULE | Freq: Every day | RESPIRATORY_TRACT | Status: DC

## 2011-10-29 MED ORDER — BUDESONIDE-FORMOTEROL FUMARATE 80-4.5 MCG/ACT IN AERO: 2.0000 | INHALATION_SPRAY | Freq: Two times a day (BID) | RESPIRATORY_TRACT | Status: DC

## 2011-10-29 NOTE — Patient Instructions (Signed)
Follow MAC issues with Dr Wendie Agreste; hopefully you ccan come off that treatment Continue voricanazole Restart spiriva and symbicort and after new year 2013 cut prednisone down to 104m per day. Nurse will ensure refills I will see you in mid-feb 2012 with spirometry at followup I will keep Dr GCorrin Parkerposted Have flu shot today

## 2011-10-29 NOTE — Progress Notes (Signed)
Subjective:    Patient ID: [name], male    DOB: May 02, 1976, 35 y.o.   MRN: [id number]  HPI Folloowu ABPA, MAC, Aspergilloma   OV 06/27/2011: Tolerating prednisone 28m daily well. Feels asymptomatic or minimal symptoms. Compliant with meds. 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 [facility] - he is considering sarcoid. Possibly sent off ACE. He needs more info from [place] I am not sure he has gotten. Fev1 today is 1.5L/38% and unchanged from last visit and no difference from 328mprednisone per day. He has been advised per Dr. GoCorrin Parkero reduce prednisone dosage which I agree with. Saw Dr VaWendie Agrestef I Dand been asked to continue Mac Rx. No further hemoptysis   OV 10/29/2011 Folloowu ABPA, MAC, Aspergilloma. Resp status overall okay but because he was doing 'well' he stopped spiriva and symbicort 1 month ago and since then more dyspneic. Compliant with MAC Rx and voricanazole for aspergillus. Due to see Dr VaWendie Agresteoon. Today Spirometry fev1 1.59L/38%, Ratio 50 which is his baseline.   REgarding [place]: got email 10/07/11 from DR GoCorrin Parkerho said, " what has happened to [name] The pathologist here was able to get the remainder of the surgical specimen and said that the aspergillus looked bad.  They saw necrotizing granulomas that he thought fit infectious driven process better than sarcoidosis although this could not be ruled out.  Given the positrive aspergillus precipins and IgE for aspergillus I think that you are left having to treat him with vori and likley some amount of prednisone and then with azoithro/ethambutol for the MAC--basically exactly what you were doing....  Has he done OK?"  Past medical: in interim has developed back pain and T6 collpase due to prednisone. Has cut prednisone to 2054mer day and is tolerating lower doses fine. No neuro issues. Wants to cut prednisone down to 73m43mr day  Social and family:  unchanged     Review of Systems  Constitutional: Negative for fever and unexpected weight change.  HENT: Negative for ear pain, nosebleeds, congestion, sore throat, rhinorrhea, sneezing, trouble swallowing, dental problem, postnasal drip and sinus pressure.   Eyes: Negative for redness and itching.  Respiratory: Positive for cough, chest tightness and shortness of breath. Negative for wheezing.   Cardiovascular: Negative for palpitations and leg swelling.  Gastrointestinal: Negative for nausea, vomiting and diarrhea.  Genitourinary: Negative for dysuria.  Musculoskeletal: Negative for joint swelling.  Skin: Negative for rash.  Neurological: Negative for headaches.  Hematological: Does not bruise/bleed easily.  Psychiatric/Behavioral: Negative for dysphoric mood. The patient is not nervous/anxious.        Objective:   Physical Exam  Nursing note and vitals reviewed. Constitutional: He is oriented to person, place, and time. No distress.       Thin  Body mass index is 21.00 kg/(m^2).   HENT:  Head: Normocephalic and atraumatic.  Right Ear: External ear normal.  Left Ear: External ear normal.  Mouth/Throat: Oropharynx is clear and moist. No oropharyngeal exudate.       Acne on face  Eyes: Conjunctivae and EOM are normal. Pupils are equal, round, and reactive to light. Right eye exhibits no discharge. Left eye exhibits no discharge. No scleral icterus.  Neck: Normal range of motion. Neck supple. No JVD present. No tracheal deviation present. No thyromegaly present.  Cardiovascular: Normal rate, regular rhythm and intact distal pulses.  Exam reveals no  gallop and no friction rub.   No murmur heard. Pulmonary/Chest: Effort normal and breath sounds normal. No respiratory distress. He has no wheezes. He has no rales. He exhibits no tenderness.       Mild barrell chest  Abdominal: Soft. Bowel sounds are normal. He exhibits no distension and no mass. There is no tenderness. There is no  rebound and no guarding.  Musculoskeletal: Normal range of motion. He exhibits no edema and no tenderness.  Lymphadenopathy:    He has no cervical adenopathy.  Neurological: He is alert and oriented to person, place, and time. He has normal reflexes. No cranial nerve deficit. Coordination normal.  Skin: Skin is warm and dry. No rash noted. He is not diaphoretic. No erythema. No pallor.       tatoos  Psychiatric: He has a normal mood and affect. His behavior is normal. Judgment and thought content normal.          Assessment & Plan:

## 2011-10-30 ENCOUNTER — Encounter: Payer: Self-pay | Admitting: Internal Medicine

## 2011-10-30 ENCOUNTER — Telehealth: Payer: Self-pay | Admitting: Internal Medicine

## 2011-10-30 NOTE — Assessment & Plan Note (Signed)
Given path burden of aspergillus and early 2012 SIRS while off vori, I suspect he continues to need vori esp to reduce need for prednisone given thoracic spine compression

## 2011-10-30 NOTE — Assessment & Plan Note (Addendum)
Given path burden of aspergillus and early 2012 SIRS while off vori, I suspect he continues to need vori esp to reduce need for prednisone given thoracic spine compression. Will have him restart his spiriva and symbicort and a month from now cut down prednisone from 86m per dday to 134mper day and reasses

## 2011-10-30 NOTE — Assessment & Plan Note (Signed)
He will be seeing Dr Wendie Agreste soon. I suspect if his AFB is negative, he can come off Rx

## 2011-10-30 NOTE — Telephone Encounter (Signed)
Please add Dr Tommy Medal and Dr Corrin Parker in care provider list and pleas send note to Dr Corrin Parker at [facility]

## 2011-10-30 NOTE — Telephone Encounter (Signed)
Done. [place] Bing, CMA

## 2011-11-26 ENCOUNTER — Ambulatory Visit (INDEPENDENT_AMBULATORY_CARE_PROVIDER_SITE_OTHER): Payer: BC Managed Care – PPO | Admitting: Infectious Disease

## 2011-11-26 ENCOUNTER — Encounter: Payer: Self-pay | Admitting: Infectious Disease

## 2011-11-26 VITALS — BP 143/91 | HR 91 | Temp 98.7°F | Ht 69.0 in | Wt 146.0 lb

## 2011-11-26 DIAGNOSIS — A318 Other mycobacterial infections: Secondary | ICD-10-CM

## 2011-11-26 DIAGNOSIS — B449 Aspergillosis, unspecified: Secondary | ICD-10-CM

## 2011-11-26 DIAGNOSIS — B4481 Allergic bronchopulmonary aspergillosis: Secondary | ICD-10-CM

## 2011-11-26 NOTE — Assessment & Plan Note (Signed)
Sp resection. Will check CXR, sputum for fungal culture (though positive sputum cutlure not itself clear indicaiton for rx)

## 2011-11-26 NOTE — Assessment & Plan Note (Signed)
Still on steroids, Still on voriconazole though he has been on longer course than for typical ABPA (> 6 months). His case is fairly complicated given that he has flared off steroids fairly easily and that he also had aspergilloma, I will recheck IgE level, CXR fungal sputum culture. My inclination would be to stop his voricoanzole unless he has either evidence or invasive disease, antoher aspergilloma or poorly controlled ABPA

## 2011-11-26 NOTE — Assessment & Plan Note (Signed)
His BAL was negative for MAvrium last April, and before that in November of 2011. If he is negative on these sputum cultures can take him off--though his liklihood of recurrence is high.

## 2011-11-26 NOTE — Progress Notes (Signed)
Subjective:    Patient ID: [name], male    DOB: June 10, 1976, 36 y.o.   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 He has been seen at [facility] by Dr. Corrin Farmer in Pulmonary who has reviewed his lab findings path reports etc. 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 He is recovering from this. He continues to produce sputum, typically clear, worse on some days then others. He denies hemoptysis or recetn weight loss. He has continued his MAvium treatment along with steroids and voriconazole for ABPA. I spent greater than 45 minutes with the patient including greater than 50% of time in face to face counsel of the patient and in coordination of their care.    Review of Systems  Constitutional: Negative for fever, chills, diaphoresis, activity change, appetite change, fatigue and unexpected weight change.  HENT: Negative for congestion, sore throat, rhinorrhea, sneezing, trouble swallowing and sinus pressure.   Eyes: Negative for photophobia and visual disturbance.  Respiratory: Positive for cough. Negative for chest tightness, shortness of breath, wheezing and stridor.   Cardiovascular: Negative for chest pain, palpitations and leg swelling.  Gastrointestinal: Negative for nausea, vomiting, abdominal pain, diarrhea, constipation, blood in stool, abdominal distention and anal bleeding.  Genitourinary: Negative for dysuria, hematuria, flank pain and difficulty urinating.  Musculoskeletal: Positive for back pain. Negative for myalgias, joint swelling, arthralgias and gait problem.  Skin: Negative for color change, pallor, rash and wound.  Neurological: Negative for dizziness, tremors,  weakness and light-headedness.  Hematological: Negative for adenopathy. Does not bruise/bleed easily.  Psychiatric/Behavioral: Negative for behavioral problems, confusion, sleep disturbance, dysphoric mood, decreased concentration and agitation.       Objective:   Physical Exam  Constitutional: He is oriented to person, place, and time. He appears well-developed and well-nourished. No distress.  HENT:  Head: Normocephalic and atraumatic.  Mouth/Throat: Oropharynx is clear and moist. No oropharyngeal exudate.  Eyes: Conjunctivae and EOM are normal. Pupils are equal, round, and reactive to light. No scleral icterus.  Neck: Normal range of motion. Neck supple. No JVD present.  Cardiovascular: Normal rate, regular rhythm and normal heart sounds.  Exam reveals no gallop and no friction rub.   No murmur heard. Pulmonary/Chest: Breath sounds normal. No respiratory distress. He has no wheezes. He has no rales. He exhibits no tenderness.       Prolonged expiratory phase  Abdominal: He exhibits no distension and no mass. There is no tenderness. There is no rebound and no guarding.  Musculoskeletal: He exhibits no edema and no tenderness.  Lymphadenopathy:    He has no cervical adenopathy.  Neurological: He is alert and oriented to person, place, and time. He has normal reflexes. He exhibits normal muscle tone. Coordination normal.  Skin: Skin is warm and dry. He is not diaphoretic. No erythema. No pallor.  Psychiatric: He has a normal mood and affect. His behavior is normal. Judgment and thought content normal.          Assessment & Plan:  ABPA (allergic bronchopulmonary aspergillosis) Still on steroids, Still on voriconazole though he has been on longer course than for typical ABPA (> 6 months). His case is fairly complicated given that he has flared off steroids fairly easily and that he also had  aspergilloma, I will recheck IgE level, CXR fungal sputum culture. My inclination would be to  stop his voricoanzole unless he has either evidence or invasive disease, antoher aspergilloma or poorly controlled ABPA  BACTEREMIA, MYCOBACTERIUM AVIUM COMPLEX His BAL was negative for MAvrium last April, and before that in November of 2011. If he is negative on these sputum cultures can take him off--though his liklihood of recurrence is high.  Aspergilloma Sp resection. Will check CXR, sputum for fungal culture (though positive sputum cutlure not itself clear indicaiton for rx)

## 2011-11-27 LAB — COMPLETE METABOLIC PANEL WITH GFR
ALT: 16 U/L (ref 0–53)
CO2: 24 mEq/L (ref 19–32)
Chloride: 100 mEq/L (ref 96–112)
GFR, Est African American: 89 mL/min
Sodium: 137 mEq/L (ref 135–145)
Total Bilirubin: 0.7 mg/dL (ref 0.3–1.2)
Total Protein: 6.6 g/dL (ref 6.0–8.3)

## 2011-11-27 LAB — CBC WITH DIFFERENTIAL/PLATELET
Basophils Relative: 0 % (ref 0–1)
Eosinophils Absolute: 0 10*3/uL (ref 0.0–0.7)
Eosinophils Relative: 0 % (ref 0–5)
Hemoglobin: 16.9 g/dL (ref 13.0–17.0)
MCH: 29.5 pg (ref 26.0–34.0)
MCHC: 33.9 g/dL (ref 30.0–36.0)
MCV: 87.2 fL (ref 78.0–100.0)
Monocytes Absolute: 0.3 10*3/uL (ref 0.1–1.0)
Monocytes Relative: 2 % — ABNORMAL LOW (ref 3–12)
Neutrophils Relative %: 93 % — ABNORMAL HIGH (ref 43–77)

## 2011-11-27 LAB — IGE: IgE (Immunoglobulin E), Serum: 841.8 IU/mL — ABNORMAL HIGH (ref 0.0–180.0)

## 2011-12-10 ENCOUNTER — Ambulatory Visit
Admission: RE | Admit: 2011-12-10 | Discharge: 2011-12-10 | Disposition: A | Discharge status: Home / Self Care | Payer: BC Managed Care – PPO | Source: Ambulatory Visit | Attending: Infectious Disease | Admitting: Infectious Disease

## 2011-12-10 ENCOUNTER — Other Ambulatory Visit: Payer: BC Managed Care – PPO

## 2011-12-10 ENCOUNTER — Other Ambulatory Visit (INDEPENDENT_AMBULATORY_CARE_PROVIDER_SITE_OTHER): Payer: BC Managed Care – PPO | Admitting: Licensed Clinical Social Worker

## 2011-12-10 DIAGNOSIS — A318 Other mycobacterial infections: Secondary | ICD-10-CM

## 2011-12-10 DIAGNOSIS — A310 Pulmonary mycobacterial infection: Secondary | ICD-10-CM

## 2011-12-10 DIAGNOSIS — B4481 Allergic bronchopulmonary aspergillosis: Secondary | ICD-10-CM

## 2011-12-14 ENCOUNTER — Telehealth: Payer: Self-pay | Admitting: Internal Medicine

## 2011-12-14 MED ORDER — ETHAMBUTOL HCL 400 MG PO TABS: 1000.0000 mg | ORAL_TABLET | Freq: Every day | ORAL | Status: DC

## 2011-12-14 NOTE — Telephone Encounter (Signed)
That comes through ID Dr Tommy Medal. If you cnanot get hold of ID office, ok to fill but please let Dr Tommy Medal know

## 2011-12-14 NOTE — Telephone Encounter (Signed)
[facility] states pt is requesting a 90 day supply  ethambuto 400 mg  Take two and one-half tablet by mouth every day  #225 DR RAMASWAMY  IS THIS OK TO FILL  THANK YOU

## 2011-12-14 NOTE — Telephone Encounter (Signed)
Per pharmacy this medication has always been filled by [name]. Refill sent for a 90 day supply to [facility] on [address].

## 2012-01-02 ENCOUNTER — Ambulatory Visit: Payer: BC Managed Care – PPO | Admitting: Internal Medicine

## 2012-01-08 LAB — FUNGUS CULTURE W SMEAR

## 2012-01-09 ENCOUNTER — Ambulatory Visit: Payer: BC Managed Care – PPO | Admitting: Internal Medicine

## 2012-01-30 ENCOUNTER — Encounter: Payer: Self-pay | Admitting: Internal Medicine

## 2012-01-30 ENCOUNTER — Ambulatory Visit (INDEPENDENT_AMBULATORY_CARE_PROVIDER_SITE_OTHER): Payer: BC Managed Care – PPO | Admitting: Internal Medicine

## 2012-01-30 VITALS — BP 120/88 | HR 62 | Temp 97.8°F | Ht 69.0 in | Wt 144.0 lb

## 2012-01-30 DIAGNOSIS — J4489 Other specified chronic obstructive pulmonary disease: Secondary | ICD-10-CM | POA: Insufficient documentation

## 2012-01-30 DIAGNOSIS — B4481 Allergic bronchopulmonary aspergillosis: Secondary | ICD-10-CM

## 2012-01-30 DIAGNOSIS — A318 Other mycobacterial infections: Secondary | ICD-10-CM

## 2012-01-30 DIAGNOSIS — J449 Chronic obstructive pulmonary disease, unspecified: Secondary | ICD-10-CM | POA: Insufficient documentation

## 2012-01-30 DIAGNOSIS — B449 Aspergillosis, unspecified: Secondary | ICD-10-CM

## 2012-01-30 NOTE — Assessment & Plan Note (Signed)
MAC with BL bronchiectasiss, Bilateral UL cavities, hemoptysis Jan 2012 due to LUL aspergilloma - s;p VATS LUL posterior segmentectomy Feb 2012. On MAC Rx since 2010 .   BAL LUL April 2012 - no AFB on culture. Dr Wendie Agreste  - August 2012: Continued Rx without rifampin due to its intereaction with voriconazole per Dr. Wendie Agreste - Dec 10, 2011: AFB smear and culture negative - prelim  PLAN D/w Dr Wendie Agreste, he is seeing him 02/13/12 and will decide on dc MAC Rx

## 2012-01-30 NOTE — Patient Instructions (Signed)
You are better than before For ABPA, continue voriconazole, prednisone and inhalers For MAC, it is possible that DR Wendie Agreste might stop the TB drugs - please ensure you followup with him REturn to see me in 6 months with spirometry and walk test at followup Come sooner if there are problems

## 2012-01-30 NOTE — Progress Notes (Signed)
Subjective:    Patient ID: [name], male    DOB: 1976-09-15, 36 y.o.   MRN: [id number]  HPI Folloowu ABPA, MAC, Aspergilloma, Cachexia  Body mass index is 21.00 kg/(m^2). On 10/29/11 Body mass index is 21.27 kg/(m^2). on 01/30/2012   OV 06/27/2011: Tolerating prednisone 86m daily well. Feels asymptomatic or minimal symptoms. Compliant with meds. 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 [facility] - he is considering sarcoid. Possibly sent off ACE. He needs more info from [place] I am not sure he has gotten. Fev1 today is 1.5L/38% and unchanged from last visit and no difference from 332mprednisone per day. He has been advised per Dr. GoCorrin Parkero reduce prednisone dosage which I agree with. Saw Dr VaWendie Agrestef I Dand been asked to continue Mac Rx. No further hemoptysis   OV 10/29/2011 Folloowu ABPA, MAC, Aspergilloma. Resp status overall okay but because he was doing 'well' he stopped spiriva and symbicort 1 month ago and since then more dyspneic. Compliant with MAC Rx and voricanazole for aspergillus. Due to see Dr VaWendie Agresteoon. Today Spirometry fev1 1.59L/38%, Ratio 50 which is his baseline.   REgarding DUToledopinion: got email 10/07/11 from DR GoCorrin Parkerho said, " what has happened to [name] The pathologist here was able to get the remainder of the surgical specimen and said that the aspergillus looked bad.  They saw necrotizing granulomas that he thought fit infectious driven process better than sarcoidosis although this could not be ruled out.  Given the positrive aspergillus precipins and IgE for aspergillus I think that you are left having to treat him with vori and likley some amount of prednisone and then with azoithro/ethambutol for the MAC--basically exactly what you were doing....  Has he done OK?"  Past medical: in interim has developed back pain and T6 collpase due to prednisone. Has cut prednisone to 2072mer day and is tolerating  lower doses fine. No neuro issues. Wants to cut prednisone down to 58m32mr day  Social and family: unchanged  REC Follow MAC issues with Dr Van Wendie Agrestepefully you ccan come off that treatment  Continue voricanazole  Restart spiriva and symbicort and after new year 2013 cut prednisone down to 58mg25m day. Nurse will ensure refills  I will see you in mid-feb 2012 with spirometry at followup  I will keep Dr GoverCorrin Parkered  Have flu shot today  OV 01/30/2012 Followup for above. Doing well. Working in restaChiropractorpliant with all medications. Occassional cough only. Appetite good. No weight loss. Tanning and exercising. No fevers.  Reviewed ID notes an per their plan if AFB culture negative, they plan to stop MAC drugs.   Recent labs 1.7/13 Ig E 800s 12/10/11 Sputum AFB  Smear and culture negative (prelim) 12/10/11 Fungal sputum culture - negative (final) 01/30/2012 spirometry - fev1 1.7L/41%, ratip 52 - BEST EVER 01/30/2012 walking desaturation test: 185 feet x 3 laps: did not deaturate  Past, Family, Social reviewed: no change since last visit other than noted in HPI     Review of Systems  Constitutional: Negative for fever and unexpected weight change.  HENT: Negative for ear pain, nosebleeds, congestion, sore throat, rhinorrhea, sneezing, trouble swallowing, dental problem, postnasal drip and sinus pressure.   Eyes: Negative for redness and itching.  Respiratory: Negative for cough, chest tightness, shortness of breath and wheezing.   Cardiovascular: Negative for palpitations and leg swelling.  Gastrointestinal: Negative for nausea and vomiting.  Genitourinary: Negative for dysuria.  Musculoskeletal: Negative for joint swelling.  Skin: Negative for rash.  Neurological: Negative for headaches.  Hematological: Does not bruise/bleed easily.  Psychiatric/Behavioral: Negative for dysphoric mood. The patient is not nervous/anxious.        Objective:   Physical Exam  Nursing note  and vitals reviewed. Constitutional: He is oriented to person, place, and time. No distress.       Thin  Body mass index is 21.00 kg/(m^2). On 10/29/11 Body mass index is 21.27 kg/(m^2). on 01/30/2012     HENT:  Head: Normocephalic and atraumatic.  Right Ear: External ear normal.  Left Ear: External ear normal.  Mouth/Throat: Oropharynx is clear and moist. No oropharyngeal exudate.       Acne on face , LOOKNG MORE TAN Eyes: Conjunctivae and EOM are normal. Pupils are equal, round, and reactive to light. Right eye exhibits no discharge. Left eye exhibits no discharge. No scleral icterus.  Neck: Normal range of motion. Neck supple. No JVD present. No tracheal deviation present. No thyromegaly present.  Cardiovascular: Normal rate, regular rhythm and intact distal pulses.  Exam reveals no gallop and no friction rub.   No murmur heard. Pulmonary/Chest: Effort normal and breath sounds normal. No respiratory distress. He has no wheezes. He has no rales. He exhibits no tenderness.       Mild barrell chest  Abdominal: Soft. Bowel sounds are normal. He exhibits no distension and no mass. There is no tenderness. There is no rebound and no guarding.  Musculoskeletal: Normal range of motion. He exhibits no edema and no tenderness.  Lymphadenopathy:    He has no cervical adenopathy.  Neurological: He is alert and oriented to person, place, and time. He has normal reflexes. No cranial nerve deficit. Coordination normal.  Skin: Skin is warm and dry. No rash noted. He is not diaphoretic. No erythema. No pallor.       tatoos  Psychiatric: He has a normal mood and affect. His behavior is normal. Judgment and thought content normal.          Assessment & Plan:

## 2012-01-30 NOTE — Assessment & Plan Note (Signed)
He is stable though has severe obstructive lung disease. 01/30/2012 fev1 is 1.7L/40% and is best ever. Did not desaturate with exertion 01/30/2012 Advisd to continue pred 37m per day and ICS. ROV 6 months

## 2012-01-30 NOTE — Assessment & Plan Note (Signed)
New dx 03/12/11 - diagnosed when he developed SIRS with high fever 103F after dc of voricanazole, not responding to antibiotics  Bilateral UL bronchiectasis + Total Ig E  - 4185 IU/mL, 4744 IU/mL on 03/07/2011 and 03/08/2011 respectively   Aspergillus serum galatcomannan assay - 0.4 - negative on 03/06/2011 Aspergillus antibody via complement fixatin test -1:256 on 03/08/2011   Aspergillus speciec on BAL LUL - apr 2012 Aspergillus IgE skin test - negative (histamine control test was positive) 03/30/2011 (done on prednisone for several weeks) IgE 800s - 12/06/11  COURSE - Started systemic steroids and restart voricanazole mid-April 2012 - Recurrence of symptoms May 2012 when prednisone tapered to 70m per day - INcreased prednisone to 479mper day May 10, 2011 - DEcreased prednisone t 1070mer day (t6 collapse) dec 2012 and started ICS  PLAN Given IgE elevation at 800s in Jan 2013, advised to continue voricanazole, prednisone and ICS for now.

## 2012-02-05 ENCOUNTER — Telehealth: Payer: Self-pay | Admitting: Infectious Disease

## 2012-02-05 NOTE — Telephone Encounter (Signed)
Patients AFB cultures were negative. He can stop his MAvium drugs. I also did correspond with Su Hoff at [facility] ID. Dr. Cassandria Santee concurred that he would likely err on side of lifelong voriconzole. Message left to stop [facility] and azithro

## 2012-02-12 ENCOUNTER — Other Ambulatory Visit: Payer: Self-pay | Admitting: Licensed Clinical Social Worker

## 2012-02-12 DIAGNOSIS — B449 Aspergillosis, unspecified: Secondary | ICD-10-CM

## 2012-02-12 MED ORDER — VORICONAZOLE 200 MG PO TABS: 200.0000 mg | ORAL_TABLET | Freq: Two times a day (BID) | ORAL | Status: DC

## 2012-02-13 ENCOUNTER — Encounter: Payer: Self-pay | Admitting: Infectious Disease

## 2012-02-13 ENCOUNTER — Ambulatory Visit (INDEPENDENT_AMBULATORY_CARE_PROVIDER_SITE_OTHER): Payer: BC Managed Care – PPO | Admitting: Infectious Disease

## 2012-02-13 VITALS — BP 156/84 | HR 73 | Temp 97.6°F | Wt 143.0 lb

## 2012-02-13 DIAGNOSIS — B4481 Allergic bronchopulmonary aspergillosis: Secondary | ICD-10-CM

## 2012-02-13 DIAGNOSIS — A310 Pulmonary mycobacterial infection: Secondary | ICD-10-CM

## 2012-02-13 DIAGNOSIS — B449 Aspergillosis, unspecified: Secondary | ICD-10-CM

## 2012-02-13 DIAGNOSIS — A318 Other mycobacterial infections: Secondary | ICD-10-CM

## 2012-02-13 MED ORDER — VORICONAZOLE 200 MG PO TABS: 200.0000 mg | ORAL_TABLET | Freq: Two times a day (BID) | ORAL | Status: DC

## 2012-02-13 NOTE — Assessment & Plan Note (Deleted)
OFF drugs now. With structural lung disease obviously at risk for recurrence

## 2012-02-13 NOTE — Progress Notes (Signed)
Subjective:    Patient ID: [name], male    DOB: October 03, 1976, 36 y.o.   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 He has been seen at [facility] by Dr. Corrin Parker in Pulmonary who has reviewed his lab findings path reports etc. Since I last saw him we re-cultured his sputum and it was negative for M Avium.  He denies hemoptysis or recetn weight loss. He has continued  with steroids and voriconazole for ABPA. CXR in January showed ? Early aspergilloma in cavity in LUL. I discussed his case with Dr. Su Hoff with [name] ID and he agreed that pt should likely stay on long-term if not lifelong voriconazole under these circumstances.    Review of Systems  Constitutional: Negative for fever, chills, diaphoresis, activity change, appetite change, fatigue and unexpected weight change.  HENT: Negative for congestion, sore throat, rhinorrhea, sneezing, trouble swallowing and sinus pressure.   Eyes: Negative for photophobia and visual disturbance.  Respiratory: Positive for cough. Negative for chest tightness, shortness of breath, wheezing and stridor.   Cardiovascular: Negative for chest pain, palpitations and leg swelling.  Gastrointestinal: Negative for nausea, vomiting, abdominal pain, diarrhea, constipation, blood in stool, abdominal distention and anal bleeding.  Genitourinary: Negative for dysuria, hematuria, flank pain and difficulty urinating.  Musculoskeletal: Negative for myalgias, back pain, joint swelling, arthralgias and gait problem.  Skin: Negative for color change, pallor, rash and wound.  Neurological: Negative for dizziness, tremors, weakness and light-headedness.  Hematological: Negative for adenopathy. Does not bruise/bleed  easily.  Psychiatric/Behavioral: Negative for behavioral problems, confusion, sleep disturbance, dysphoric mood, decreased concentration and agitation.       Objective:   Physical Exam  Constitutional: He is oriented to person, place, and time. He appears well-developed and well-nourished. No distress.  HENT:  Head: Normocephalic and atraumatic.  Mouth/Throat: Oropharynx is clear and moist. No oropharyngeal exudate.  Eyes: Conjunctivae and EOM are normal. Pupils are equal, round, and reactive to light. No scleral icterus.  Neck: Normal range of motion. Neck supple. No JVD present.  Cardiovascular: Normal rate, regular rhythm and normal heart sounds.  Exam reveals no gallop and no friction rub.   No murmur heard. Pulmonary/Chest: Effort normal and breath sounds normal. No respiratory distress. He has no wheezes. He has no rales.   He exhibits no tenderness.  Abdominal: He exhibits no distension and no mass. There is no tenderness. There is no rebound and no guarding.  Musculoskeletal: He exhibits no edema and no tenderness.  Lymphadenopathy:    He has no cervical adenopathy.  Neurological: He is alert and oriented to person, place, and time. He has normal reflexes. He exhibits normal muscle tone. Coordination normal.  Skin: Skin is warm and dry. He is not diaphoretic. No erythema. No pallor.  Psychiatric: He has a normal mood and affect. His behavior is normal. Judgment and thought content normal.          Assessment & Plan:  Aspergilloma Possible new one in the LUL. Will repeat CXR in jUly, See above discussion will planon long term if not lifelong voriconazole  Mycobacterium avium infection OFF drugs now. With structural lung disease obviously at risk for recurrence  ABPA (allergic bronchopulmonary aspergillosis) Still on low dose steroids and also on voriconzole. PFTs were best  in quite some time. FOllowed closely by [facility] CCM

## 2012-02-13 NOTE — Assessment & Plan Note (Signed)
Possible new one in the LUL. Will repeat CXR in jUly, See above discussion will planon long term if not lifelong voriconazole

## 2012-02-13 NOTE — Assessment & Plan Note (Signed)
OFF drugs now. With structural lung disease obviously at risk for recurrence

## 2012-02-13 NOTE — Assessment & Plan Note (Signed)
Still on low dose steroids and also on voriconzole. PFTs were best in quite some time. FOllowed closely by [facility] CCM

## 2012-03-10 ENCOUNTER — Other Ambulatory Visit: Payer: Self-pay | Admitting: Licensed Clinical Social Worker

## 2012-03-10 ENCOUNTER — Telehealth: Payer: Self-pay | Admitting: Licensed Clinical Social Worker

## 2012-03-10 DIAGNOSIS — A310 Pulmonary mycobacterial infection: Secondary | ICD-10-CM

## 2012-03-10 NOTE — Telephone Encounter (Signed)
I spoke with Dr. Tommy Medal and he would like the patient to bring in a sputum sample and then restart MAC mediations. I called and left the patient a message as he requested.

## 2012-03-10 NOTE — Telephone Encounter (Signed)
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 He states he can't be seen until Thursday or Friday. I made him an appointment to be seen on Thursday with Dr. Linus Salmons. He states that he can't sleep and he has fevers ranging from 100 to 102. Please advise.

## 2012-03-13 ENCOUNTER — Ambulatory Visit (INDEPENDENT_AMBULATORY_CARE_PROVIDER_SITE_OTHER): Payer: BC Managed Care – PPO | Admitting: Internal Medicine

## 2012-03-13 ENCOUNTER — Ambulatory Visit
Admission: RE | Admit: 2012-03-13 | Discharge: 2012-03-13 | Disposition: A | Discharge status: Home / Self Care | Payer: BC Managed Care – PPO | Source: Ambulatory Visit | Attending: Internal Medicine | Admitting: Internal Medicine

## 2012-03-13 ENCOUNTER — Encounter: Payer: Self-pay | Admitting: Internal Medicine

## 2012-03-13 VITALS — BP 128/84 | HR 81 | Temp 98.6°F | Ht 69.0 in | Wt 141.8 lb

## 2012-03-13 DIAGNOSIS — A310 Pulmonary mycobacterial infection: Secondary | ICD-10-CM

## 2012-03-13 LAB — COMPREHENSIVE METABOLIC PANEL
ALT: 33 U/L (ref 0–53)
BUN: 10 mg/dL (ref 6–23)
CO2: 30 mEq/L (ref 19–32)
Calcium: 9 mg/dL (ref 8.4–10.5)
Chloride: 100 mEq/L (ref 96–112)
Creat: 1.1 mg/dL (ref 0.50–1.35)
Glucose, Bld: 107 mg/dL — ABNORMAL HIGH (ref 70–99)
Total Bilirubin: 0.6 mg/dL (ref 0.3–1.2)

## 2012-03-13 LAB — CBC
HCT: 47.6 % (ref 39.0–52.0)
Hemoglobin: 16.1 g/dL (ref 13.0–17.0)
MCH: 29.9 pg (ref 26.0–34.0)
MCV: 88.5 fL (ref 78.0–100.0)
Platelets: 292 10*3/uL (ref 150–400)
RBC: 5.38 MIL/uL (ref 4.22–5.81)
WBC: 11.3 10*3/uL — ABNORMAL HIGH (ref 4.0–10.5)

## 2012-03-13 MED ORDER — ETHAMBUTOL HCL 400 MG PO TABS: 1000.0000 mg | ORAL_TABLET | Freq: Every day | ORAL | Status: DC

## 2012-03-13 MED ORDER — AZITHROMYCIN 500 MG PO TABS: 500.0000 mg | ORAL_TABLET | Freq: Every day | ORAL | Status: DC

## 2012-03-13 NOTE — Progress Notes (Signed)
Patient ID: [name], male   DOB: May 07, 1976, 36 y.o.   MRN: [id number]  INFECTIOUS DISEASE PROGRESS NOTE    Subjective: [name] is seen a work in basis. He has chronic lung disease and is on lifelong voriconazole because of his history of previous aspergilloma and allergic bronchopulmonary aspergillosis. He is also had Mycobacterium avium infection and completed a year-long course of therapy on March 19. He states that he was feeling very good at the end of his Mycobacterium avium therapy and had minimal cough and shortness of breath. Over the past 3 weeks he has developed a persistent cough each day productive of large amounts of brown sputum. He has also been more short of breath and occasionally has to stop to catch his breath when walking. He is also had some fevers as high as 102. He has not missed any doses of his voriconazole or prednisone. He is not exactly sure what the dose of his prednisone is currently. He knows he is supposed to be taking 10 mg daily but for some reason thinks he may have a 20 mg tablet.  Objective: Temp: 98.6 F (37 C) (04/25 1625) Temp src: Oral (04/25 1625) BP: 128/84 mmHg (04/25 1625) Pulse Rate: 81  (04/25 1625)  General: His weight is up 5 pounds since his last visit to 141 pounds. Skin: He has some tattoos but no rash Lungs: He has some audible wheezing from across the room but on auscultation his lungs are actually fairly clear Cor: Regular S1 and S2 no murmurs   Assessment: It is possible that he has an early relapse of his Mycobacterium avium pneumonia but he could also have some other form of acute bronchitis or community-acquired pneumonia. I will check a repeat chest x-ray today, lab work, and send sputum samples for Gram stain, routine culture, AFB stain and culture. Given the severity of his symptoms I will go ahead and restart azithromycin and ethambutol pending the results of the AFB cultures.  Plan: 1. PA and lateral chest x-ray 2. CBC and  complete metabolic panel 3. Sputum for Gram stain, culture, AFB stain and culture 4. Restart azithromycin and ethambutol 5. I asked him to call back to clarify his daily dose of prednisone 6. Followup in one month   Michel Bickers, MD [facility] [place] [phone] pager   [phone] cell 03/13/2012, 4:49 PM

## 2012-03-13 NOTE — Progress Notes (Signed)
Addended by: Lorne Skeens D on: 03/13/2012 05:12 PM   Modules accepted: Orders

## 2012-03-13 NOTE — Patient Instructions (Signed)
Please schedule a followup appointment in about one month with Dr. Tommy Medal.

## 2012-03-17 LAB — RESPIRATORY CULTURE OR RESPIRATORY AND SPUTUM CULTURE
Gram Stain: NONE SEEN
Organism ID, Bacteria: NORMAL

## 2012-04-15 ENCOUNTER — Ambulatory Visit: Payer: BC Managed Care – PPO | Admitting: Infectious Disease

## 2012-04-28 LAB — AFB CULTURE WITH SMEAR (NOT AT ARMC)

## 2012-07-02 ENCOUNTER — Ambulatory Visit (INDEPENDENT_AMBULATORY_CARE_PROVIDER_SITE_OTHER): Payer: BC Managed Care – PPO | Admitting: Infectious Disease

## 2012-07-02 ENCOUNTER — Encounter: Payer: Self-pay | Admitting: Infectious Disease

## 2012-07-02 VITALS — BP 133/74 | HR 79 | Temp 98.2°F | Ht 69.0 in | Wt 142.0 lb

## 2012-07-02 DIAGNOSIS — B4481 Allergic bronchopulmonary aspergillosis: Secondary | ICD-10-CM

## 2012-07-02 DIAGNOSIS — B449 Aspergillosis, unspecified: Secondary | ICD-10-CM

## 2012-07-02 DIAGNOSIS — A318 Other mycobacterial infections: Secondary | ICD-10-CM

## 2012-07-02 NOTE — Progress Notes (Signed)
Subjective:    Patient ID: [name], male    DOB: Nov 18, 1976, 36 y.o.   MRN: [id number]  HPI  36 year old Asian with history of Mycobacterium avium infection also with history history of aspergilloma status post resection by cardiothoracic surgery then found to have what appears to be allergic bronchopulmonary aspergillosis  Spring 38 and restarted on voriconazole and steroids, then found to have apparent new aspergilloma. We have taken him off his Mycobacterium avium drugs and then he developed fevers chills and malaise and was seen by my partner Dr. Megan Salon in late April. His AFB smears and cultures obtained in the clinic on that day prior to the knee she Asian of azithromycin ethambutol ultimately failed to grow Mycobacterium avium. Patient doing relatively well today still with some early cough in the morning. He is tolerating his medicines well. We discussed various options and I felt to be prudent to take him off his ethambutol and azithromycin and see how he does off of it. Certainly if he has return of symptoms we can get sputum again in reculture for AFB. We also again restarted him on anti-M. avium drugs. Certainly I expect his M. avium to recur but would like to give him possible holidays from the therapy when he meets criteria for clearance of infection. He is agreeable to this strategy.   Review of Systems  Constitutional: Negative for fever, chills, diaphoresis, activity change, appetite change, fatigue and unexpected weight change.  HENT: Negative for congestion, sore throat, rhinorrhea, sneezing, trouble swallowing and sinus pressure.   Eyes: Negative for photophobia and visual disturbance.  Respiratory: Positive for cough. Negative for chest tightness, shortness of breath, wheezing and stridor.   Cardiovascular: Negative for chest pain, palpitations and leg swelling.  Gastrointestinal: Negative for nausea, vomiting, abdominal pain, diarrhea, constipation, blood in stool,  abdominal distention and anal bleeding.  Genitourinary: Negative for dysuria, hematuria, flank pain and difficulty urinating.  Musculoskeletal: Negative for myalgias, back pain, joint swelling, arthralgias and gait problem.  Skin: Negative for color change, pallor, rash and wound.  Neurological: Negative for dizziness, tremors, weakness and light-headedness.  Hematological: Negative for adenopathy. Does not bruise/bleed easily.  Psychiatric/Behavioral: Negative for behavioral problems, confusion, disturbed wake/sleep cycle, dysphoric mood, decreased concentration and agitation.       Objective:   Physical Exam  Constitutional: He is oriented to person, place, and time. He appears well-developed and well-nourished. No distress.  HENT:  Head: Normocephalic and atraumatic.  Mouth/Throat: Oropharynx is clear and moist. No oropharyngeal exudate.  Eyes: Conjunctivae and EOM are normal. Pupils are equal, round, and reactive to light. No scleral icterus.  Neck: Normal range of motion. Neck supple. No JVD present.  Cardiovascular: Normal rate, regular rhythm and normal heart sounds.  Exam reveals no gallop and no friction rub.   No murmur heard. Pulmonary/Chest: Effort normal and breath sounds normal. No respiratory distress. He has no wheezes. He has no rales. He exhibits no tenderness.       Prolonged expiratory phase with some crackles at the bases bilaterally.  Abdominal: He exhibits no distension and no mass. There is no tenderness. There is no rebound and no guarding.  Musculoskeletal: He exhibits no edema and no tenderness.  Lymphadenopathy:    He has no cervical adenopathy.  Neurological: He is alert and oriented to person, place, and time. He has normal reflexes. He exhibits normal muscle tone. Coordination normal.  Skin: Skin is warm and dry. He is not diaphoretic. No erythema. No pallor.  Psychiatric: He has a normal mood and affect. His behavior is normal. Judgment and thought content  normal.          Assessment & Plan:  Aspergilloma Continue lifelong for consult we can check a voriconazole trough in future as well.  ABPA (allergic bronchopulmonary aspergillosis) Continue lifelong voriconazole steroids being managed by [facility].  BACTEREMIA, MYCOBACTERIUM AVIUM COMPLEX I will give him a trial off his Mycobacterium avium drugs again and see if he has evidence of relapse if he does we will have cultures sent for AFB once again.

## 2012-07-02 NOTE — Assessment & Plan Note (Signed)
Continue lifelong voriconazole steroids being managed by [facility].

## 2012-07-02 NOTE — Assessment & Plan Note (Signed)
Continue lifelong for consult we can check a voriconazole trough in future as well.

## 2012-07-02 NOTE — Assessment & Plan Note (Signed)
I will give him a trial off his Mycobacterium avium drugs again and see if he has evidence of relapse if he does we will have cultures sent for AFB once again.

## 2012-07-18 ENCOUNTER — Telehealth: Payer: Self-pay | Admitting: *Deleted

## 2012-07-18 NOTE — Telephone Encounter (Signed)
Called patient and advised him per Dr Tommy Medal to restart his medication but that Dr Tommy Medal wants him to drop a sputum sample by the office next week to test.

## 2012-07-18 NOTE — Telephone Encounter (Signed)
Patient called and advised that since he stopped taking his MAC medication he has started running a fever of 101-103, been having sever headaches, having violent coughing spells and just feels bad. He advised this happened the last time he was taken off of the medication and Dr Tommy Medal had him start to take the medication again. He wants to know if he should restart the med now also. Advised him will contact the doctor and call him back when we get an answer.

## 2012-07-20 NOTE — Telephone Encounter (Signed)
He was going to try to collect sputum in steril container, refridgerate at home and bring in and start Mac drugs in meantime

## 2012-07-22 ENCOUNTER — Other Ambulatory Visit: Payer: BC Managed Care – PPO

## 2012-07-22 ENCOUNTER — Other Ambulatory Visit: Payer: Self-pay | Admitting: Licensed Clinical Social Worker

## 2012-07-22 DIAGNOSIS — A310 Pulmonary mycobacterial infection: Secondary | ICD-10-CM

## 2012-07-23 ENCOUNTER — Other Ambulatory Visit: Payer: BC Managed Care – PPO

## 2012-08-14 ENCOUNTER — Telehealth: Payer: Self-pay | Admitting: Internal Medicine

## 2012-08-14 ENCOUNTER — Ambulatory Visit (INDEPENDENT_AMBULATORY_CARE_PROVIDER_SITE_OTHER): Payer: BC Managed Care – PPO | Admitting: Internal Medicine

## 2012-08-14 ENCOUNTER — Encounter: Payer: Self-pay | Admitting: Internal Medicine

## 2012-08-14 VITALS — BP 118/78 | HR 79 | Temp 98.1°F | Ht 70.0 in | Wt 139.8 lb

## 2012-08-14 DIAGNOSIS — B4481 Allergic bronchopulmonary aspergillosis: Secondary | ICD-10-CM

## 2012-08-14 DIAGNOSIS — Z23 Encounter for immunization: Secondary | ICD-10-CM

## 2012-08-14 MED ORDER — TIOTROPIUM BROMIDE MONOHYDRATE 18 MCG IN CAPS: 18.0000 ug | ORAL_CAPSULE | Freq: Every day | RESPIRATORY_TRACT | Status: DC

## 2012-08-14 MED ORDER — PREDNISONE 20 MG PO TABS: 10.0000 mg | ORAL_TABLET | Freq: Every day | ORAL | Status: DC

## 2012-08-14 MED ORDER — BUDESONIDE-FORMOTEROL FUMARATE 80-4.5 MCG/ACT IN AERO: 2.0000 | INHALATION_SPRAY | Freq: Two times a day (BID) | RESPIRATORY_TRACT | Status: DC

## 2012-08-14 NOTE — Assessment & Plan Note (Signed)
For ABPA, continue voriconazole, prednisone and inhalers - take refills on spiriva and symbicort For MAC,continue TB drugs per ID General health: because you are on chronic prednisone talk to your PMD .Provider Not In System about health issues on prednisone especially regarding bones Have flu shot today 08/14/2012 REturn to see me in 6 months with spirometry and walk test at followup Come sooner if there are problems 

## 2012-08-14 NOTE — Patient Instructions (Addendum)
For ABPA, continue voriconazole, prednisone and inhalers - take refills on spiriva and symbicort For MAC,continue TB drugs per ID General health: because you are on chronic prednisone talk to your PMD .Provider Not In System about health issues on prednisone especially regarding bones Have flu shot today 08/14/2012 REturn to see me in 6 months with spirometry and walk test at followup Come sooner if there are problems

## 2012-08-14 NOTE — Telephone Encounter (Signed)
I left message for PMD to call back but please call his office and say that because of chronic steroids PMD to monitor patient bone health - check Vitamin D, ensure calcium intake and ? Do bone scan etc., and I want to talk to PMD about this  Please fax my note to them  Thanks MR

## 2012-08-14 NOTE — Progress Notes (Signed)
Subjective:    Patient ID: [name], male    DOB: 03/28/1976, 36 y.o.   MRN: [id number]  HPI [place] ABPA, MAC, Aspergilloma, Cachexia  Body mass index is 21.00 kg/(m^2). On 10/29/11 Body mass index is 21.27 kg/(m^2). on 01/30/2012 Body mass index is 20.06 kg/(m^2). on 08/14/2012     OV 06/27/2011: Tolerating prednisone 47m daily well. Feels asymptomatic or minimal symptoms. Compliant with meds. 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 [facility] - he is considering sarcoid. Possibly sent off ACE. He needs more info from [place] am [place] sure he has gotten. Fev1 today is 1.5L/38% and unchanged from last visit and no difference from 377mprednisone per day. He has been advised per Dr. GoCorrin Parkero reduce prednisone dosage which I agree with. Saw Dr VaWendie Agrestef I Dand been asked to continue Mac Rx. No further hemoptysis   OV 10/29/2011 [place]u ABPA, MAC, Aspergilloma. Resp status overall okay but because he was doing 'well' he stopped spiriva and symbicort 1 month ago and since then more dyspneic. Compliant with MAC Rx and voricanazole for aspergillus. Due to see Dr VaWendie Agresteoon. Today Spirometry fev1 1.59L/38%, Ratio 50 which is his baseline.   REgarding [place]: got email 10/07/11 from DR GoCorrin Parkerho said, " what has happened to [name] The pathologist here was able to get the remainder of the surgical specimen and said that the aspergillus looked bad.  They saw necrotizing granulomas that he thought fit infectious driven process better than sarcoidosis although this could not be ruled out.  Given the positrive aspergillus precipins and IgE for aspergillus I think that you are left having to treat him with vori and likley some amount of prednisone and then with azoithro/ethambutol for the MAC--basically exactly what you were doing....  Has he done OK?"  Past medical: in interim has developed back pain and T6 collpase due to prednisone.  Has cut prednisone to 2029mer day and is tolerating lower doses fine. No neuro issues. Wants to cut prednisone down to 73m57mr day  Social and family: unchanged  REC Follow MAC issues with Dr Van Wendie Agrestepefully you ccan come off that treatment  Continue voricanazole  Restart spiriva and symbicort and after new year 2013 cut prednisone down to 73mg65m day. Nurse will ensure refills  I will see you in mid-feb 2012 with spirometry at followup  I will keep Dr GoverCorrin Parkered  Have flu shot today  OV 01/30/2012 Followup for above. Doing well. Working in rest[place]rop[employer] with all medications. Occassional cough only. Appetite good. No weight loss. Tanning and exercising. No fevers.  Reviewed ID notes an per their plan if AFB culture negative, they plan to stop MAC drugs.   Recent labs 1.7/13 Ig E 800s 12/10/11 Sputum AFB  Smear and culture negative (prelim) 12/10/11 Fungal sputum culture - negative (final) 01/30/2012 spirometry - fev1 1.7L/41%, ratip 52 - BEST EVER 01/30/2012 walking desaturation test: 185 feet x 3 laps: did not deaturate  Past, Family, Social reviewed: no change since last visit other than noted in HPI      You are better than before  For ABPA, continue voriconazole, prednisone and inhalers  For MAC, it is possible that DR Van DWendie Agrestet stop the TB drugs - please ensure you followup with him  REturn to see me in 6 months with spirometry and walk test at followup  Come  sooner if there are problems  OV 08/14/2012  Saw ID: came off MAC drugs in April 2013 and within 2 weeks developed cough, fatigue, malaise, sputum. Says sputum negative for MAC. Then same thing in august : came off MAC Rx 06/19/12 but by 07/02/12 called ID doc back with fever 104, SIRS symptoms, cough and restarted MAC rx.  AFB smear 9/.4/13 negative for aFB with culture in progress. This has now  happened on 2 challenges and each time when MAC drugs reintroduced symptoms of fever, cough, resolved and  energy levels improved. With august trial off MAC RXs he lost 15# but now  slowly gaining weight; gained 5# back. He continues to be aware that lung transplant is a "last resort"  Spiro fev1 1.7L/40%, ratio 55 which is his baseline. Is compliant with symbicort and spiriva. Needs refills.  Has not had flu shot but will have it today 08/14/2012  Past, Family, Social reviewed: no change since last visit other than HPI. No admist. No ER visits. No hospitalizations. Continues to work in [place]  Constitutional: Negative for fever and unexpected weight change.  HENT: Negative for ear pain, nosebleeds, congestion, sore throat, rhinorrhea, sneezing, trouble swallowing, dental problem, postnasal drip and sinus pressure.   Eyes: Negative for redness and itching.  Respiratory: Negative for cough, chest tightness, shortness of breath and wheezing.   Cardiovascular: Negative for palpitations and leg swelling.  Gastrointestinal: Negative for nausea and vomiting.  Genitourinary: Negative for dysuria.  Musculoskeletal: Negative for joint swelling.  Skin: Negative for rash.  Neurological: Negative for headaches.  Hematological: Does not bruise/bleed easily.  Psychiatric/Behavioral: Negative for dysphoric mood. The patient is not nervous/anxious.   Current outpatient prescriptions:albuterol (PROVENTIL HFA;VENTOLIN HFA) 108 (90 BASE) MCG/ACT inhaler, Inhale 2 puffs into the lungs every 6 (six) hours as needed., Disp: , Rfl: ;  budesonide-formoterol (SYMBICORT) 80-4.5 MCG/ACT inhaler, Inhale 2 puffs into the lungs 2 (two) times daily., Disp: 1 Inhaler, Rfl: 6;  ethambutol (MYAMBUTOL) 400 MG tablet, Take 2.5 tablets by mouth Daily., Disp: , Rfl:  ferrous sulfate (FERRO-BOB) 325 (65 FE) MG tablet, Take 325 mg by mouth daily.  , Disp: , Rfl: ;  levothyroxine (SYNTHROID, LEVOTHROID) 112 MCG tablet, Take 112 mcg by mouth daily., Disp: , Rfl: ;  predniSONE (DELTASONE) 20 MG tablet, Take 10  mg by mouth daily. , Disp: , Rfl: ;  tiotropium (SPIRIVA HANDIHALER) 18 MCG inhalation capsule, Place 1 capsule (18 mcg total) into inhaler and inhale daily., Disp: 30 capsule, Rfl: 5 voriconazole (VFEND) 200 MG tablet, Take 1 tablet (200 mg total) by mouth 2 (two) times daily., Disp: 60 tablet, Rfl: 11      Objective:   Physical Exam Nursing note and vitals reviewed. Constitutional: He is oriented to person, place, and time. No distress.       Thin  Body mass index is 21.00 kg/(m^2). On 10/29/11 Body mass index is 21.27 kg/(m^2). on 01/30/2012 Body mass index is 20.06 kg/(m^2). on 08/14/2012      HENT:  Head: Normocephalic and atraumatic.  Right Ear: External ear normal.  Left Ear: External ear normal.  Mouth/Throat: Oropharynx is clear and moist. No oropharyngeal exudate.       Acne on face , LOOKNG MORE TAN Eyes: Conjunctivae and EOM are normal. Pupils are equal, round, and reactive to light. Right eye exhibits no discharge. Left eye exhibits no discharge. No scleral icterus.  Neck: Normal range of motion. Neck supple. No  JVD present. No tracheal deviation present. No thyromegaly present.  Cardiovascular: Normal rate, regular rhythm and intact distal pulses.  Exam reveals no gallop and no friction rub.   No murmur heard. Pulmonary/Chest: Effort normal and breath sounds normal. No respiratory distress. He has no wheezes. He has no rales. He exhibits no tenderness.       Mild barrell chest  Abdominal: Soft. Bowel sounds are normal. He exhibits no distension and no mass. There is no tenderness. There is no rebound and no guarding.  Musculoskeletal: Normal range of motion. He exhibits no edema and no tenderness.  Lymphadenopathy:    He has no cervical adenopathy.  Neurological: He is alert and oriented to person, place, and time. He has normal reflexes. No cranial nerve deficit. Coordination normal.  Skin: Skin is warm and dry. No rash noted. He is not diaphoretic. No erythema. No  pallor.       tatoos  Psychiatric: He has a normal mood and affect. His behavior is normal. Judgment and thought content normal.           Assessment & Plan:

## 2012-08-26 NOTE — Telephone Encounter (Signed)
I [id number] with nurse for Dr. Anselmo Pickler. Will await call back.Kasota Bing, CMA

## 2012-08-27 NOTE — Telephone Encounter (Signed)
Shirlean Mylar, nurse for Dr. Anselmo Pickler returned Jennifer's call & can be reached at [phone].  Satira Anis

## 2012-08-29 NOTE — Telephone Encounter (Signed)
I spoke with Brett Henri, NP and advised of MR recs. He states he will take care of this but that the pt has cancelled and no show for last 2 appts there. Dawson Bing, CMA

## 2012-09-03 ENCOUNTER — Ambulatory Visit: Payer: BC Managed Care – PPO | Admitting: Infectious Disease

## 2012-09-03 LAB — ACID-FAST (MYCOBACTERIA) SMEAR AND CULTURE WITH REFLEX TO IDENTIFICATION: Acid Fast Smear: NONE SEEN

## 2012-09-17 ENCOUNTER — Ambulatory Visit: Payer: BC Managed Care – PPO | Admitting: Infectious Disease

## 2012-09-22 ENCOUNTER — Encounter: Payer: Self-pay | Admitting: Infectious Disease

## 2012-09-22 ENCOUNTER — Ambulatory Visit (INDEPENDENT_AMBULATORY_CARE_PROVIDER_SITE_OTHER): Payer: BC Managed Care – PPO | Admitting: Infectious Disease

## 2012-09-22 VITALS — BP 126/85 | HR 67 | Temp 98.0°F | Ht 69.0 in | Wt 134.5 lb

## 2012-09-22 DIAGNOSIS — B4481 Allergic bronchopulmonary aspergillosis: Secondary | ICD-10-CM

## 2012-09-22 DIAGNOSIS — A310 Pulmonary mycobacterial infection: Secondary | ICD-10-CM

## 2012-09-22 DIAGNOSIS — M810 Age-related osteoporosis without current pathological fracture: Secondary | ICD-10-CM | POA: Insufficient documentation

## 2012-09-22 DIAGNOSIS — B449 Aspergillosis, unspecified: Secondary | ICD-10-CM

## 2012-09-22 LAB — COMPLETE METABOLIC PANEL WITH GFR
ALT: 17 U/L (ref 0–53)
Albumin: 3.7 g/dL (ref 3.5–5.2)
CO2: 31 mEq/L (ref 19–32)
Chloride: 102 mEq/L (ref 96–112)
GFR, Est African American: >89 mL/min
Potassium: 4 mEq/L (ref 3.5–5.3)
Sodium: 142 mEq/L (ref 135–145)
Total Bilirubin: 0.6 mg/dL (ref 0.3–1.2)
Total Protein: 6.4 g/dL (ref 6.0–8.3)

## 2012-09-22 LAB — CBC WITH DIFFERENTIAL/PLATELET
Basophils Relative: 0 % (ref 0–1)
Eosinophils Absolute: 0.1 10*3/uL (ref 0.0–0.7)
Hemoglobin: 17 g/dL (ref 13.0–17.0)
MCH: 30.4 pg (ref 26.0–34.0)
MCHC: 34.9 g/dL (ref 30.0–36.0)
Monocytes Absolute: 0.9 10*3/uL (ref 0.1–1.0)
Monocytes Relative: 9 % (ref 3–12)
Neutrophils Relative %: 65 % (ref 43–77)

## 2012-09-22 LAB — TESTOSTERONE: Testosterone: 573.42 ng/dL (ref 300–890)

## 2012-09-22 NOTE — Progress Notes (Signed)
Subjective:    Patient ID: [name], male    DOB: 10/18/1976, 36 y.o.   MRN: [id number]  HPI  36 year old Asian with history of Mycobacterium avium infection also with history history of aspergilloma status post resection by cardiothoracic surgery then found to have what appears to be allergic bronchopulmonary aspergillosis Spring 2012 and restarted on voriconazole and steroids, then found to have apparent new aspergilloma. We have taken him off his Mycobacterium avium drugs and then he developed fevers chills and malaise and was seen by my partner Dr. Megan Salon in late April. His AFB smears and cultures obtained in the clinic on that day prior to the initiation of azithromycin ethambutol ultimately failed to grow Mycobacterium avium. She had been doing well I saw him in August we discontinued his azithromycin and ethambutol again. Unfortunately he again shortly thereafter developed fevers cough malaise. Her to the weekend he did produce a sputum specimen which he refrigerated and brought to her left lab for AFB culture. He started back on azithromycin ethambutol. Cultures done for AFB here were again negative. The patient however feels dramatically better having been back on azithromycin and ethambutol. He wants to stay on these medicines to prevent recurrences of his Mycobacterium avium infection. This is not unreasonable we'll continue them for now. He is also tolerating his chronic voriconazole without difficulties.  He is seen [name] recently who is currently sure the patient gets plugged back and with permit care physician to make sure his bone health plan as above and D. and calcium. I've offered check serum testosterone level as well today.  Review of Systems  Constitutional: Negative for fever, chills, diaphoresis, activity change, appetite change, fatigue and unexpected weight change.  HENT: Negative for congestion, sore throat, rhinorrhea, sneezing, trouble swallowing and sinus  pressure.   Eyes: Negative for photophobia and visual disturbance.  Respiratory: Positive for cough. Negative for chest tightness, shortness of breath, wheezing and stridor.   Cardiovascular: Negative for chest pain, palpitations and leg swelling.  Gastrointestinal: Negative for nausea, vomiting, abdominal pain, diarrhea, constipation, blood in stool, abdominal distention and anal bleeding.  Genitourinary: Negative for dysuria, hematuria, flank pain and difficulty urinating.  Musculoskeletal: Negative for myalgias, back pain, joint swelling, arthralgias and gait problem.  Skin: Negative for color change, pallor, rash and wound.  Neurological: Negative for dizziness, tremors, weakness and light-headedness.  Hematological: Negative for adenopathy. Does not bruise/bleed easily.  Psychiatric/Behavioral: Negative for behavioral problems, confusion, sleep disturbance, dysphoric mood, decreased concentration and agitation.       Objective:   Physical Exam  Constitutional: He is oriented to person, place, and time. He appears well-nourished. No distress.  HENT:  Head: Normocephalic and atraumatic.  Eyes: Conjunctivae normal and EOM are normal. Pupils are equal, round, and reactive to light. No scleral icterus.  Neck: Normal range of motion. Neck supple.  Cardiovascular: Normal rate, regular rhythm and normal heart sounds.  Exam reveals no gallop and no friction rub.   No murmur heard. Pulmonary/Chest: Breath sounds normal. No respiratory distress. He has no wheezes. He has no rales. He exhibits no tenderness.       Prolonged expiratory phase  Abdominal: He exhibits no distension and no mass. There is no tenderness. There is no rebound and no guarding.  Musculoskeletal: He exhibits no edema and no tenderness.  Lymphadenopathy:    He has no cervical adenopathy.  Neurological: He is alert and oriented to person, place, and time. He exhibits normal muscle tone. Coordination normal.  Skin: Skin is  warm and dry. He is not diaphoretic. No erythema. No pallor.  Psychiatric: He has a normal mood and affect. His behavior is normal. Judgment and thought content normal.          Assessment & Plan:   Aspergilloma: continue lifelong voriconazole  Allergic bronchopulmonary aspgergillosis: As above, lifelong voriconazole steroids per care medicine.  Mycobacterium avium pulmonary infection: Once again did not isolate an organism but this not mean that he did not have a flare of this infection given that this habit again after coming off of his ethambutol and azithromycin. I'll continue him on these drugs now indefinitely and see how he does.  Osteoporosis with compression fractures: I will check a serum  testosterone level, and encouraged him to followup with primary care physician.

## 2012-09-29 ENCOUNTER — Other Ambulatory Visit: Payer: Self-pay | Admitting: *Deleted

## 2012-09-29 DIAGNOSIS — A310 Pulmonary mycobacterial infection: Secondary | ICD-10-CM

## 2012-09-29 MED ORDER — AZITHROMYCIN 500 MG PO TABS
500.0000 mg | ORAL_TABLET | Freq: Every day | ORAL | Status: DC
Start: 2012-09-29 — End: 2013-03-09

## 2012-10-26 ENCOUNTER — Other Ambulatory Visit: Payer: Self-pay | Admitting: Internal Medicine

## 2013-01-20 ENCOUNTER — Telehealth: Payer: Self-pay | Admitting: Internal Medicine

## 2013-01-20 MED ORDER — BUDESONIDE-FORMOTEROL FUMARATE 80-4.5 MCG/ACT IN AERO: 2.0000 | INHALATION_SPRAY | Freq: Two times a day (BID) | RESPIRATORY_TRACT | Status: DC

## 2013-01-20 NOTE — Telephone Encounter (Signed)
rx for the symbicort has been sent to the pharmacy per pts request. Called and spoke with pt and he is aware. Nothing further is needed.

## 2013-02-13 ENCOUNTER — Other Ambulatory Visit: Payer: Self-pay | Admitting: *Deleted

## 2013-02-13 DIAGNOSIS — A310 Pulmonary mycobacterial infection: Secondary | ICD-10-CM

## 2013-02-13 MED ORDER — VORICONAZOLE 200 MG PO TABS: 200.0000 mg | ORAL_TABLET | Freq: Two times a day (BID) | ORAL | Status: DC

## 2013-02-17 ENCOUNTER — Other Ambulatory Visit: Payer: Self-pay | Admitting: Internal Medicine

## 2013-03-09 ENCOUNTER — Ambulatory Visit (INDEPENDENT_AMBULATORY_CARE_PROVIDER_SITE_OTHER): Payer: BC Managed Care – PPO | Admitting: Internal Medicine

## 2013-03-09 ENCOUNTER — Telehealth: Payer: Self-pay | Admitting: Internal Medicine

## 2013-03-09 ENCOUNTER — Encounter: Payer: Self-pay | Admitting: Internal Medicine

## 2013-03-09 VITALS — BP 108/62 | HR 65 | Temp 98.0°F | Ht 70.0 in | Wt 134.8 lb

## 2013-03-09 DIAGNOSIS — B4481 Allergic bronchopulmonary aspergillosis: Secondary | ICD-10-CM

## 2013-03-09 DIAGNOSIS — M810 Age-related osteoporosis without current pathological fracture: Secondary | ICD-10-CM

## 2013-03-09 NOTE — Progress Notes (Signed)
Subjective:    Patient ID: [name], male    DOB: [birth year], 37 y.o.   MRN: [id number]  HPI [place] ABPA, MAC, Aspergilloma, Cachexia  Body mass index is 21.00 kg/(m^2). On 10/29/11 Body mass index is 21.27 kg/(m^2). on 01/30/2012 Body mass index is 20.06 kg/(m^2). on 08/14/2012 Body mass index is 19.34 kg/(m^2). on 03/09/2013       OV 06/27/2011: Tolerating prednisone 58m daily well. Feels asymptomatic or minimal symptoms. Compliant with meds. 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 [facility] - he is considering sarcoid. Possibly sent off ACE. He needs more info from [place] sure he has gotten. Fev1 today is 1.5L/38% and unchanged from last visit and no difference from 365mprednisone per day. He has been advised per Dr. GoCorrin Parkero reduce prednisone dosage which I agree with. Saw Dr VaWendie Agrestef I Dand been asked to continue Mac Rx. No further hemoptysis   OV 10/29/2011 [place] ABPA, MAC, Aspergilloma. Resp status overall okay but because he was doing 'well' he stopped spiriva and symbicort 1 month ago and since then more dyspneic. Compliant with MAC Rx and voricanazole for aspergillus. Due to see Dr VaWendie Agresteoon. Today Spirometry fev1 1.59L/38%, Ratio 50 which is his baseline.   REgarding DUPetrospinion: got email 10/07/11 from DR GoCorrin Parkerho said, " what has happened to [name] The pathologist here was able to get the remainder of the surgical specimen and said that the aspergillus looked bad.  They saw necrotizing granulomas that he thought fit infectious driven process better than sarcoidosis although this could not be ruled out.  Given the positrive aspergillus precipins and IgE for aspergillus I think that you are left having to treat him with vori and likley some amount of prednisone and then with azoithro/ethambutol for the MAC--basically exactly what you were doing....  Has he done OK?"  Past medical: in interim has  developed back pain and T6 collpase due to prednisone. Has cut prednisone to 205mer day and is tolerating lower doses fine. No neuro issues. Wants to cut prednisone down to 79m78mr day  Social and family: unchanged  REC Follow MAC issues with Dr Van Wendie Agrestepefully you ccan come off that treatment  Continue voricanazole  Restart spiriva and symbicort and after new year 2013 cut prednisone down to 79mg63m day. Nurse will ensure refills  I will see you in mid-feb 2012 with spirometry at followup  I will keep Dr GoverCorrin Parkered  Have flu shot today  OV 01/30/2012 Followup for above. Doing well. Working in [place] with all medications. Occassional cough only. Appetite good. No weight loss. Tanning and exercising. No fevers.  Reviewed ID notes an per their plan if AFB culture negative, they plan to stop MAC drugs.   Recent labs 1.7/13 Ig E 800s 12/10/11 Sputum AFB  Smear and culture negative (prelim) 12/10/11 Fungal sputum culture - negative (final) 01/30/2012 spirometry - fev1 1.7L/41%, ratip 52 - BEST EVER 01/30/2012 walking desaturation test: 185 feet x 3 laps: did not deaturate  Past, Family, Social reviewed: no change since last visit other than noted in HPI      You are better than before  For ABPA, continue voriconazole, prednisone and inhalers  For MAC, it is possible that DR Van DWendie Agrestet stop the TB drugs - please ensure you followup with him  REturn to see me in 6  months with spirometry and walk test at followup  Come sooner if there are problems  OV 08/14/2012  Saw ID: came off MAC drugs in April 2013 and within 2 weeks developed cough, fatigue, malaise, sputum. Says sputum negative for MAC. Then same thing in august : came off MAC Rx 06/19/12 but by 07/02/12 called ID doc back with fever 104, SIRS symptoms, cough and restarted MAC rx.  AFB smear 9/.4/13 negative for aFB with culture in progress. This has now  happened on 2 challenges and each time when MAC drugs  reintroduced symptoms of fever, cough, resolved and energy levels improved. With august trial off MAC RXs he lost 15# but now  slowly gaining weight; gained 5# back. He continues to be aware that lung transplant is a "last resort"  Spiro fev1 1.7L/40%, ratio 55 which is his baseline. Is compliant with symbicort and spiriva. Needs refills.  Has not had flu shot but will have it today 08/14/2012  Past, Family, Social reviewed: no change since last visit other than HPI. No admist. No ER visits. No hospitalizations. Continues to work in [place] For ABPA, continue voriconazole, prednisone and inhalers - take refills on spiriva and symbicort  For MAC,continue TB drugs per ID  General health: because you are on chronic prednisone talk to your PMD .Provider Not In System about health issues on prednisone especially regarding bones  Have flu shot today 08/14/2012  REturn to see me in 6 months with spirometry and walk test at followup  Come sooner if there are problems    OV 03/09/2013   - Followup for all of the above. He continues to do well with stable respiratory symptoms which is occasional mild wheezing with heavy exertion. 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 So far he is tolerating it fine. He is yet to sort out osteoporosis management with his primary care physician who is at [facility]. In fact he is asking for a switch to [place] [facility]. Spirometry fev1.65L/39%, R 55 and is stable  - Main issue is that he is not having weight gain despite working out at the gym and taking whey protein powder   Review of Systems  Constitutional: Negative for fever and unexpected weight change.  HENT: Negative for ear pain, nosebleeds, congestion, sore throat, rhinorrhea, sneezing, trouble swallowing,  dental problem, postnasal drip and sinus pressure.   Eyes: Negative for redness and itching.  Respiratory: Negative for cough, chest tightness, shortness of breath and wheezing.   Cardiovascular: Negative for palpitations and leg swelling.  Gastrointestinal: Negative for nausea and vomiting.  Genitourinary: Negative for dysuria.  Musculoskeletal: Negative for joint swelling.  Skin: Negative for rash.  Neurological: Negative for headaches.  Hematological: Does not bruise/bleed easily.  Psychiatric/Behavioral: Negative for dysphoric mood. The patient is not nervous/anxious.    Current outpatient prescriptions:albuterol (PROVENTIL HFA;VENTOLIN HFA) 108 (90 BASE) MCG/ACT inhaler, Inhale 2 puffs into the lungs every 6 (six) hours as needed., Disp: , Rfl: ;  azithromycin (ZITHROMAX) 500 MG tablet, Take 500 mg by mouth daily., Disp: , Rfl: ;  budesonide-formoterol (SYMBICORT) 80-4.5 MCG/ACT inhaler, Inhale 2 puffs into the lungs 2 (two) times daily., Disp: 1 Inhaler, Rfl: 5 ethambutol (MYAMBUTOL) 400 MG tablet, TAKE TWO AND ONE-HALF TABLETS BY MOUTH EVERY DAY, Disp: 225 tablet, Rfl: 0;  ferrous sulfate (  FERRO-BOB) 325 (65 FE) MG tablet, Take 325 mg by mouth daily.  , Disp: , Rfl: ;  levothyroxine (SYNTHROID, LEVOTHROID) 112 MCG tablet, Take 112 mcg by mouth daily., Disp: , Rfl: ;  predniSONE (DELTASONE) 20 MG tablet, Take 0.5 tablets (10 mg total) by mouth daily., Disp: 60 tablet, Rfl: 6 tiotropium (SPIRIVA HANDIHALER) 18 MCG inhalation capsule, Place 1 capsule (18 mcg total) into inhaler and inhale daily., Disp: 30 capsule, Rfl: 5;  voriconazole (VFEND) 200 MG tablet, Take 1 tablet (200 mg total) by mouth 2 (two) times daily., Disp: 60 tablet, Rfl: 11     Objective:   Physical Exam Nursing note and vitals reviewed. Constitutional: He is oriented to person, place, and time. No distress.       Thin  Body mass index is 21.00 kg/(m^2). On 10/29/11 Body mass index is 21.27 kg/(m^2). on 01/30/2012 Body  mass index is 20.06 kg/(m^2). on 08/14/2012 Body mass index is 19.34 kg/(m^2).  On 03/09/2013       HENT:  Head: Normocephalic and atraumatic.  Right Ear: External ear normal.  Left Ear: External ear normal.  Mouth/Throat: Oropharynx is clear and moist. No oropharyngeal exudate.       Acne on face , LOOKNG AN Eyes: Conjunctivae and EOM are normal. Pupils are equal, round, and reactive to light. Right eye exhibits no discharge. Left eye exhibits no discharge. No scleral icterus.  Neck: Normal range of motion. Neck supple. No JVD present. No tracheal deviation present. No thyromegaly present.  Cardiovascular: Normal rate, regular rhythm and intact distal pulses.  Exam reveals no gallop and no friction rub.   No murmur heard. Pulmonary/Chest: Effort normal and breath sounds normal. No respiratory distress. He has no wheezes. He has no rales. He exhibits no tenderness.       Mild barrell chest. Mild occasional wheeze  Abdominal: Soft. Bowel sounds are normal. He exhibits no distension and no mass. There is no tenderness. There is no rebound and no guarding.  Musculoskeletal: Normal range of motion. He exhibits no edema and no tenderness.  Lymphadenopathy:    He has no cervical adenopathy.  Neurological: He is alert and oriented to person, place, and time. He has normal reflexes. No cranial nerve deficit. Coordination normal.  Skin: Skin is warm and dry. No rash noted. He is not diaphoretic. No erythema. No pallor.       tatoos  Psychiatric: He has a normal mood and affect. His behavior is normal. Judgment and thought content normal.          Assessment & Plan:

## 2013-03-09 NOTE — Telephone Encounter (Signed)
Spoke to pt he is aware to call [facility] office to get his appt made and was given to # to call Joellen Jersey

## 2013-03-09 NOTE — Patient Instructions (Addendum)
For ABPA, continue voriconazole, prednisone per ID For MAC,continue TB drugs per ID For Obstructive lung disease  - Continue Spiriva 1 puff daily as before  - Stop Symbicort and try Dulera 100/52 puff 2 times daily.    - I have given your Dulera samples. If this is working for you please call is in one month and let us know so we continue more refills   #Followup  - 9 months with spirometry and walk test at followup - referral to change PMD to [facility] [place]

## 2013-03-10 DIAGNOSIS — M810 Age-related osteoporosis without current pathological fracture: Secondary | ICD-10-CM | POA: Insufficient documentation

## 2013-03-10 NOTE — Assessment & Plan Note (Signed)
For ABPA, continue voriconazole, prednisone per ID For MAC,continue TB drugs per ID For Obstructive lung disease  - Continue Spiriva 1 puff daily as before  - Stop Symbicort and try Dulera 100/52 puff 2 times daily.    - I have given your Dulera samples. If this is working for you please call is in one month and let us know so we continue more refills   #Followup  - 9 months with spirometry and walk test at followup - referral to change PMD to [facility] [place]

## 2013-03-10 NOTE — Assessment & Plan Note (Signed)
Arrange for transfer of primary care from [facility] to [facility] [place]

## 2013-03-23 ENCOUNTER — Other Ambulatory Visit: Payer: BC Managed Care – PPO

## 2013-03-26 ENCOUNTER — Other Ambulatory Visit: Payer: BC Managed Care – PPO

## 2013-03-30 ENCOUNTER — Other Ambulatory Visit: Payer: BC Managed Care – PPO

## 2013-04-06 ENCOUNTER — Ambulatory Visit: Payer: BC Managed Care – PPO | Admitting: Infectious Disease

## 2013-04-06 ENCOUNTER — Ambulatory Visit (INDEPENDENT_AMBULATORY_CARE_PROVIDER_SITE_OTHER): Payer: BC Managed Care – PPO | Admitting: Infectious Disease

## 2013-04-06 ENCOUNTER — Encounter: Payer: Self-pay | Admitting: Infectious Disease

## 2013-04-06 VITALS — BP 117/77 | HR 58 | Temp 98.4°F | Ht 69.0 in | Wt 139.0 lb

## 2013-04-06 DIAGNOSIS — A310 Pulmonary mycobacterial infection: Secondary | ICD-10-CM

## 2013-04-06 DIAGNOSIS — B4481 Allergic bronchopulmonary aspergillosis: Secondary | ICD-10-CM

## 2013-04-06 DIAGNOSIS — A318 Other mycobacterial infections: Secondary | ICD-10-CM

## 2013-04-06 DIAGNOSIS — IMO0002 Reserved for concepts with insufficient information to code with codable children: Secondary | ICD-10-CM

## 2013-04-06 DIAGNOSIS — B449 Aspergillosis, unspecified: Secondary | ICD-10-CM

## 2013-04-06 LAB — COMPLETE METABOLIC PANEL WITH GFR
ALT: 14 U/L (ref 0–53)
AST: 22 U/L (ref 0–37)
Albumin: 4 g/dL (ref 3.5–5.2)
Alkaline Phosphatase: 82 U/L (ref 39–117)
Calcium: 9.2 mg/dL (ref 8.4–10.5)
Chloride: 102 mEq/L (ref 96–112)
Potassium: 4.2 mEq/L (ref 3.5–5.3)
Sodium: 139 mEq/L (ref 135–145)

## 2013-04-06 MED ORDER — AZITHROMYCIN 500 MG PO TABS: 500.0000 mg | ORAL_TABLET | Freq: Every day | ORAL | Status: DC

## 2013-04-06 MED ORDER — ETHAMBUTOL HCL 400 MG PO TABS: 1000.0000 mg | ORAL_TABLET | Freq: Every day | ORAL | Status: DC

## 2013-04-06 MED ORDER — VORICONAZOLE 200 MG PO TABS: 200.0000 mg | ORAL_TABLET | Freq: Two times a day (BID) | ORAL | Status: DC

## 2013-04-06 NOTE — Progress Notes (Signed)
Subjective:    Patient ID: [name], male    DOB: September 05, 1976, 37 y.o.   MRN: [id number]  HPI  37 year old Asian with history of Mycobacterium avium infection also with history history of aspergilloma status post resection by cardiothoracic surgery then found to have what appears to be allergic bronchopulmonary aspergillosis Spring 2012 and restarted on voriconazole and steroids, then found to have apparent new aspergilloma. We have taken him off his Mycobacterium avium drugs and then he developed fevers chills and malaise and was seen by my partner Dr. Megan Salon in late April. His AFB smears and cultures obtained in the clinic on that day prior to the initiation of azithromycin ethambutol ultimately failed to grow Mycobacterium avium. She had been doing well I saw him in August we discontinued his azithromycin and ethambutol again. Unfortunately he again shortly thereafter developed fevers cough malaise. He started back on azithromycin ethambutol. Cultures done for AFB here were again negative. The patient however felt dramatically better having been back on azithromycin and ethambutol.   Since then he has continued to do well and he has much less cough and is without fevers or malaise. No abd pain, or nausea.  Review of Systems  Constitutional: Negative for fever, chills, diaphoresis, activity change, appetite change, fatigue and unexpected weight change.  HENT: Negative for congestion, sore throat, rhinorrhea, sneezing, trouble swallowing and sinus pressure.   Eyes: Negative for photophobia and visual disturbance.  Respiratory: Positive for cough. Negative for chest tightness, shortness of breath, wheezing and stridor.   Cardiovascular: Negative for chest pain, palpitations and leg swelling.  Gastrointestinal: Negative for nausea, vomiting, abdominal pain, diarrhea, constipation, blood in stool, abdominal distention and anal bleeding.  Genitourinary: Negative for dysuria, hematuria, flank pain  and difficulty urinating.  Musculoskeletal: Negative for myalgias, back pain, joint swelling, arthralgias and gait problem.  Skin: Negative for color change, pallor, rash and wound.  Neurological: Negative for dizziness, tremors, weakness and light-headedness.  Hematological: Negative for adenopathy. Does not bruise/bleed easily.  Psychiatric/Behavioral: Negative for behavioral problems, confusion, sleep disturbance, dysphoric mood, decreased concentration and agitation.       Objective:   Physical Exam  Constitutional: He is oriented to person, place, and time. He appears well-nourished. No distress.  HENT:  Head: Normocephalic and atraumatic.  Eyes: Conjunctivae and EOM are normal. Pupils are equal, round, and reactive to light. No scleral icterus.  Neck: Normal range of motion. Neck supple.  Cardiovascular: Normal rate, regular rhythm and normal heart sounds.  Exam reveals no gallop and no friction rub.   No murmur heard. Pulmonary/Chest: Breath sounds normal. No respiratory distress. He has no wheezes. He has no rales. He exhibits no tenderness.  Prolonged expiratory phase  Abdominal: He exhibits no distension and no mass. There is no tenderness. There is no rebound and no guarding.  Musculoskeletal: He exhibits no edema and no tenderness.  Lymphadenopathy:    He has no cervical adenopathy.  Neurological: He is alert and oriented to person, place, and time. He exhibits normal muscle tone. Coordination normal.  Skin: Skin is warm and dry. He is not diaphoretic. No erythema. No pallor.  Psychiatric: He has a normal mood and affect. His behavior is normal. Judgment and thought content normal.          Assessment & Plan:   Aspergilloma: continue lifelong voriconazole  Allergic bronchopulmonary aspgergillosis: As above, lifelong voriconazole steroids per Critical medicine.  Mycobacterium avium pulmonary infection: Once again did not isolate an organism but this not  mean that he  did not have a flare of this infection givenI'll continue him on these drugs now indefinitely and see how he does.  Osteoporosis with compression fractures: defer to pcp

## 2013-05-04 ENCOUNTER — Other Ambulatory Visit: Payer: Self-pay | Admitting: Licensed Clinical Social Worker

## 2013-05-04 DIAGNOSIS — B449 Aspergillosis, unspecified: Secondary | ICD-10-CM

## 2013-05-04 MED ORDER — ETHAMBUTOL HCL 400 MG PO TABS: 1000.0000 mg | ORAL_TABLET | Freq: Every day | ORAL | Status: DC

## 2013-05-04 MED ORDER — AZITHROMYCIN 500 MG PO TABS: 500.0000 mg | ORAL_TABLET | Freq: Every day | ORAL | Status: DC

## 2013-06-10 ENCOUNTER — Telehealth: Payer: Self-pay | Admitting: Internal Medicine

## 2013-06-10 MED ORDER — MOMETASONE FURO-FORMOTEROL FUM 100-5 MCG/ACT IN AERO: 2.0000 | INHALATION_SPRAY | Freq: Two times a day (BID) | RESPIRATORY_TRACT | Status: DC

## 2013-06-10 NOTE — Telephone Encounter (Signed)
Refill sent. Pt is aware. Wildwood Bing, CMA

## 2013-06-10 NOTE — Telephone Encounter (Signed)
Pt calling again in ref to previous msg.[name] ° ° °

## 2013-09-15 ENCOUNTER — Other Ambulatory Visit: Payer: Self-pay | Admitting: Internal Medicine

## 2013-10-14 ENCOUNTER — Ambulatory Visit: Payer: BC Managed Care – PPO | Admitting: Infectious Disease

## 2013-10-28 ENCOUNTER — Ambulatory Visit: Payer: BC Managed Care – PPO | Admitting: Infectious Disease

## 2013-11-23 ENCOUNTER — Encounter: Payer: Self-pay | Admitting: Infectious Disease

## 2013-11-23 ENCOUNTER — Ambulatory Visit (INDEPENDENT_AMBULATORY_CARE_PROVIDER_SITE_OTHER): Payer: BC Managed Care – PPO | Admitting: Infectious Disease

## 2013-11-23 VITALS — BP 124/78 | HR 64 | Temp 98.3°F | Wt 139.0 lb

## 2013-11-23 DIAGNOSIS — A310 Pulmonary mycobacterial infection: Secondary | ICD-10-CM

## 2013-11-23 DIAGNOSIS — M810 Age-related osteoporosis without current pathological fracture: Secondary | ICD-10-CM

## 2013-11-23 DIAGNOSIS — Z23 Encounter for immunization: Secondary | ICD-10-CM

## 2013-11-23 DIAGNOSIS — B449 Aspergillosis, unspecified: Secondary | ICD-10-CM

## 2013-11-23 DIAGNOSIS — B4481 Allergic bronchopulmonary aspergillosis: Secondary | ICD-10-CM

## 2013-11-23 LAB — CBC WITH DIFFERENTIAL/PLATELET
BASOS ABS: 0 10*3/uL (ref 0.0–0.1)
Basophils Relative: 0 % (ref 0–1)
Eosinophils Absolute: 0.1 10*3/uL (ref 0.0–0.7)
Eosinophils Relative: 1 % (ref 0–5)
HEMATOCRIT: 45.9 % (ref 39.0–52.0)
Hemoglobin: 16.1 g/dL (ref 13.0–17.0)
LYMPHS ABS: 2.4 10*3/uL (ref 0.7–4.0)
LYMPHS PCT: 30 % (ref 12–46)
MCH: 31 pg (ref 26.0–34.0)
MCHC: 35.1 g/dL (ref 30.0–36.0)
MCV: 88.3 fL (ref 78.0–100.0)
Monocytes Absolute: 0.6 10*3/uL (ref 0.1–1.0)
Monocytes Relative: 7 % (ref 3–12)
NEUTROS ABS: 5 10*3/uL (ref 1.7–7.7)
Neutrophils Relative %: 62 % (ref 43–77)
PLATELETS: 201 10*3/uL (ref 150–400)
RBC: 5.2 MIL/uL (ref 4.22–5.81)
RDW: 14.4 % (ref 11.5–15.5)
WBC: 8 10*3/uL (ref 4.0–10.5)

## 2013-11-23 NOTE — Progress Notes (Signed)
Subjective:    Patient ID: [name], male    DOB: 1976/05/29, 38 y.o.   MRN: [id number]  HPI   38 year-old Asian with history of Mycobacterium avium infection also with history history of aspergilloma status post resection by cardiothoracic surgery then found to have what appears to be allergic bronchopulmonary aspergillosis Spring 2012 and restarted on voriconazole and steroids, then found to have apparent new aspergilloma. We had taken him off his Mycobacterium avium drugs and then he developed fevers chills and malaise last Spring (2013) and was seen by my partner Dr. Megan Salon in late April. His AFB smears and cultures obtained in the clinic on that day prior to the initiation of azithromycin ethambutol ultimately failed to grow Mycobacterium avium. He had been doing well I saw him in [place] we discontinued his azithromycin and ethambutol again. Unfortunately he again shortly thereafter developed fevers cough malaise. He started back on azithromycin ethambutol. Cultures done for AFB here were again negative. The patient however felt dramatically better having been back on azithromycin and ethambutol.   Since then he has continued to do well and he has much less cough and is without fevers or malaise when I last saw him in May of 2014. He still is not on meds for his steroid induced osteoporosis and I have encouragedhim to seek PCP to rx these  He has no productive cough, minimal cough period at present and feels well. He requests flu shot which he has not yet received.  Review of Systems  Constitutional: Negative for fever, chills, diaphoresis, activity change, appetite change, fatigue and unexpected weight change.  HENT: Negative for congestion, rhinorrhea, sinus pressure, sneezing, sore throat and trouble swallowing.   Eyes: Negative for photophobia and visual disturbance.  Respiratory: Positive for cough. Negative for chest tightness, shortness of breath, wheezing and stridor.    Cardiovascular: Negative for chest pain, palpitations and leg swelling.  Gastrointestinal: Negative for nausea, vomiting, abdominal pain, diarrhea, constipation, blood in stool, abdominal distention and anal bleeding.  Genitourinary: Negative for dysuria, hematuria, flank pain and difficulty urinating.  Musculoskeletal: Negative for arthralgias, back pain, gait problem, joint swelling and myalgias.  Skin: Negative for color change, pallor, rash and wound.  Neurological: Negative for dizziness, tremors, weakness and light-headedness.  Hematological: Negative for adenopathy. Does not bruise/bleed easily.  Psychiatric/Behavioral: Negative for behavioral problems, confusion, sleep disturbance, dysphoric mood, decreased concentration and agitation.       Objective:   Physical Exam  Constitutional: He is oriented to person, place, and time. He appears well-nourished. No distress.  HENT:  Head: Normocephalic and atraumatic.  Eyes: Conjunctivae and EOM are normal. Pupils are equal, round, and reactive to light. No scleral icterus.  Neck: Normal range of motion. Neck supple.  Cardiovascular: Normal rate, regular rhythm and normal heart sounds.  Exam reveals no gallop and no friction rub.   No murmur heard. Pulmonary/Chest: No respiratory distress. He has no wheezes.  Prolonged expiratory phase  Abdominal: He exhibits no distension and no mass. There is no tenderness. There is no rebound and no guarding.  Musculoskeletal: He exhibits no edema and no tenderness.  Lymphadenopathy:    He has no cervical adenopathy.  Neurological: He is alert and oriented to person, place, and time. He exhibits normal muscle tone. Coordination normal.  Skin: Skin is warm and dry. He is not diaphoretic. No erythema. No pallor.  Psychiatric: He has a normal mood and affect. His behavior is normal. Judgment and thought content normal.  Assessment & Plan:   Aspergilloma: continue lifelong  voriconazole  Allergic bronchopulmonary aspgergillosis: As above, lifelong voriconazole steroids per Critical medicine.  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 Recheck CMP and CBC today. I spent greater than 25 minutes with the patient including greater than 50% of time in face to face counsel of the patient and in coordination of their care.   Osteoporosis with compression fractures: defer to pcp  Need for flu vaccine: given

## 2013-11-24 LAB — COMPLETE METABOLIC PANEL WITH GFR
ALBUMIN: 4 g/dL (ref 3.5–5.2)
ALK PHOS: 70 U/L (ref 39–117)
ALT: 13 U/L (ref 0–53)
AST: 16 U/L (ref 0–37)
BUN: 10 mg/dL (ref 6–23)
CALCIUM: 8.5 mg/dL (ref 8.4–10.5)
CO2: 29 mEq/L (ref 19–32)
CREATININE: 0.88 mg/dL (ref 0.50–1.35)
Chloride: 101 mEq/L (ref 96–112)
GFR, Est African American: >89 mL/min
GLUCOSE: 70 mg/dL (ref 70–99)
POTASSIUM: 3.4 meq/L — AB (ref 3.5–5.3)
Sodium: 139 mEq/L (ref 135–145)
Total Bilirubin: 0.5 mg/dL (ref 0.3–1.2)
Total Protein: 6.2 g/dL (ref 6.0–8.3)

## 2013-12-11 ENCOUNTER — Other Ambulatory Visit: Payer: Self-pay | Admitting: *Deleted

## 2013-12-11 MED ORDER — PREDNISONE 20 MG PO TABS: ORAL_TABLET | ORAL | Status: DC

## 2014-01-06 ENCOUNTER — Ambulatory Visit: Payer: BC Managed Care – PPO | Admitting: Internal Medicine

## 2014-01-06 ENCOUNTER — Ambulatory Visit: Payer: BC Managed Care – PPO

## 2014-02-16 ENCOUNTER — Other Ambulatory Visit: Payer: Self-pay | Admitting: *Deleted

## 2014-02-16 DIAGNOSIS — A310 Pulmonary mycobacterial infection: Secondary | ICD-10-CM

## 2014-02-16 DIAGNOSIS — B4481 Allergic bronchopulmonary aspergillosis: Secondary | ICD-10-CM

## 2014-02-16 MED ORDER — VORICONAZOLE 200 MG PO TABS: 200.0000 mg | ORAL_TABLET | Freq: Two times a day (BID) | ORAL | Status: DC

## 2014-02-18 ENCOUNTER — Other Ambulatory Visit: Payer: Self-pay | Admitting: Licensed Clinical Social Worker

## 2014-02-18 DIAGNOSIS — A310 Pulmonary mycobacterial infection: Secondary | ICD-10-CM

## 2014-02-18 DIAGNOSIS — B4481 Allergic bronchopulmonary aspergillosis: Secondary | ICD-10-CM

## 2014-02-18 MED ORDER — VORICONAZOLE 200 MG PO TABS: 200.0000 mg | ORAL_TABLET | Freq: Two times a day (BID) | ORAL | Status: DC

## 2014-03-19 ENCOUNTER — Telehealth: Payer: Self-pay | Admitting: Internal Medicine

## 2014-03-19 MED ORDER — PREDNISONE 20 MG PO TABS: ORAL_TABLET | ORAL | Status: DC

## 2014-03-19 NOTE — Telephone Encounter (Signed)
Refill has been sent to the pharmacy and i have called and lmom to make the pt aware.

## 2014-04-13 ENCOUNTER — Ambulatory Visit: Payer: BC Managed Care – PPO | Admitting: Internal Medicine

## 2014-04-27 ENCOUNTER — Other Ambulatory Visit: Payer: Self-pay | Admitting: *Deleted

## 2014-04-27 ENCOUNTER — Other Ambulatory Visit: Payer: Self-pay | Admitting: Infectious Disease

## 2014-04-27 DIAGNOSIS — B449 Aspergillosis, unspecified: Secondary | ICD-10-CM

## 2014-04-27 MED ORDER — AZITHROMYCIN 500 MG PO TABS: 500.0000 mg | ORAL_TABLET | Freq: Every day | ORAL | Status: DC

## 2014-04-27 MED ORDER — ETHAMBUTOL HCL 400 MG PO TABS: 1000.0000 mg | ORAL_TABLET | Freq: Every day | ORAL | Status: DC

## 2014-05-20 ENCOUNTER — Ambulatory Visit: Payer: BC Managed Care – PPO | Admitting: Infectious Disease

## 2014-05-24 ENCOUNTER — Ambulatory Visit (INDEPENDENT_AMBULATORY_CARE_PROVIDER_SITE_OTHER): Payer: BC Managed Care – PPO | Admitting: Internal Medicine

## 2014-05-24 ENCOUNTER — Encounter: Payer: Self-pay | Admitting: Internal Medicine

## 2014-05-24 ENCOUNTER — Ambulatory Visit: Payer: BC Managed Care – PPO

## 2014-05-24 VITALS — BP 118/80 | HR 68 | Ht 70.0 in | Wt 139.6 lb

## 2014-05-24 DIAGNOSIS — R0600 Dyspnea, unspecified: Secondary | ICD-10-CM

## 2014-05-24 DIAGNOSIS — B4481 Allergic bronchopulmonary aspergillosis: Secondary | ICD-10-CM

## 2014-05-24 DIAGNOSIS — R0609 Other forms of dyspnea: Secondary | ICD-10-CM

## 2014-05-24 DIAGNOSIS — R0989 Other specified symptoms and signs involving the circulatory and respiratory systems: Secondary | ICD-10-CM

## 2014-05-24 MED ORDER — ALBUTEROL SULFATE HFA 108 (90 BASE) MCG/ACT IN AERS: 2.0000 | INHALATION_SPRAY | Freq: Four times a day (QID) | RESPIRATORY_TRACT | Status: DC | PRN

## 2014-05-24 NOTE — Patient Instructions (Signed)
Glad you are doing well and symptoms well controlled Lung function is severely impaired but stable Use albuterol as needed Continue daily prednisone and voricanazole as before Continue dulera  2 puff twice daily (try BREO For a month if interested)  Followup   9 months or call sooner if needed

## 2014-05-24 NOTE — Progress Notes (Signed)
Subjective:    Patient ID: Brett JORGENSEN, male    DOB: [birth year], 38 y.o.   MRN: [id number]  HPI   Folloowu ABPA, MAC, Aspergilloma, Cachexia  Body mass index is 21.00 kg/(m^2). On 10/29/11 Body mass index is 21.27 kg/(m^2). on 01/30/2012 Body mass index is 20.06 kg/(m^2). on 08/14/2012 Body mass index is 19.34 kg/(m^2). on 03/09/2013 Body mass index is 20.03 kg/(m^2). on 05/29/2014         OV 06/27/2011: Tolerating prednisone 58m daily well. Feels asymptomatic or minimal symptoms. Compliant with meds. 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 [facility] - he is considering sarcoid. Possibly sent off ACE. [place]. Fev1 today is 1.5L/38% and unchanged from last visit and no difference from 361mprednisone per day. He has been advised per Dr. GoCorrin Parkero reduce prednisone dosage which I agree with. Saw Dr VaWendie Agrestef I Dand been asked to continue Mac Rx. No further hemoptysis   OV 10/29/2011 Folloowu ABPA, MAC, Aspergilloma. Resp status overall okay but because he was doing 'well' he stopped spiriva and symbicort 1 month ago and since then more dyspneic. Compliant with MAC Rx and voricanazole for aspergillus. Due to see Dr VaWendie Agresteoon. Today Spirometry fev1 1.59L/38%, Ratio 50 which is his baseline.   [place]garding [place]: got email 10/07/11 from DR GoCorrin Parkerho said, " what has happened to KeLevi Farmer The pathologist here was able to get the remainder of the surgical specimen and said that the aspergillus looked bad.  They saw necrotizing granulomas that he thought fit infectious driven process better than sarcoidosis although this could not be ruled out.  Given the positrive aspergillus precipins and IgE for aspergillus I think that you are left having to treat him with vori and likley some amount of prednisone and then with azoithro/ethambutol for the MAC--basically exactly what you were  doing....  Has he done OK?"  Past medical: in interim has developed back pain and T6 collpase due to prednisone. Has cut prednisone to 2093mer day and is tolerating lower doses fine. No neuro issues. Wants to cut prednisone down to 78m36mr day  Social and family: unchanged  REC Follow MAC issues with Dr Van Wendie Agrestepefully you ccan come off that treatment  Continue voricanazole  Restart spiriva and symbicort and after new year 2013 cut prednisone down to 78mg73m day. Nurse will ensure refills  I will see you in mid-feb 2012 with spirometry at followup  I will keep Dr GoverCorrin Parkered  Have flu shot today  OV 01/30/2012 Followup for above. Doing well. Working in [place] with [place] medications. Occassional cough only. Appetite good. No weight loss. Tanning and exercising. No fevers.  Reviewed ID notes an per their plan if AFB culture negative, they plan to stop MAC drugs.   Recent labs 1.7/13 Ig E 800s 12/10/11 Sputum AFB  Smear and culture negative (prelim) 12/10/11 Fungal sputum culture - negative (final) 01/30/2012 spirometry - fev1 1.7L/41%, ratip 52 - BEST EVER 01/30/2012 walking desaturation test: 185 feet x 3 laps: did not deaturate  Past, Family, Social reviewed: no change since last visit other than noted in HPI      You are better than before  For ABPA, continue voriconazole, prednisone and inhalers  For MAC, it is possible that DR Van DWendie Agrestet stop the TB drugs -  please ensure you followup with him  REturn to see me in 6 months with spirometry and walk test at followup  Come sooner if there are problems  OV 08/14/2012  Saw ID: came off MAC drugs in April 2013 and within 2 weeks developed cough, fatigue, malaise, sputum. Says sputum negative for MAC. Then same thing in august : came off MAC Rx 06/19/12 but by 07/02/12 called ID doc back with fever 104, SIRS symptoms, cough and restarted MAC rx.  AFB smear 9/.4/13 negative for aFB with culture in progress. This has  now  happened on 2 challenges and each time when MAC drugs reintroduced symptoms of fever, cough, resolved and energy levels improved. With august trial off MAC RXs he lost 15# but now  slowly gaining weight; gained 5# back. He continues to be aware that lung transplant is a "last resort"  Spiro fev1 1.7L/40%, ratio 55 which is his baseline. Is compliant with symbicort and spiriva. Needs refills.  Has not had flu shot but will have it today 08/14/2012  Past, Family, Social reviewed: no change since last visit other than HPI. No admist. No ER visits. No hospitalizations. Continues to work in [place] For ABPA, continue voriconazole, prednisone and inhalers - take refills on spiriva and symbicort  For MAC,continue TB drugs per ID  General health: because you are on chronic prednisone talk to your PMD .Provider Not In System about health issues on prednisone especially regarding bones  Have flu shot today 08/14/2012  REturn to see me in 6 months with spirometry and walk test at followup  Come sooner if there are problems    OV 03/09/2013   - Followup for all of the above. He continues to do well with stable respiratory symptoms which is occasional mild wheezing with heavy exertion. 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 So far he is tolerating it fine. He is yet to sort out osteoporosis management with his primary care physician who is at [facility]. In fact he is asking for a switch to [facility]. Spirometry fev1.65L/39%, R 55 and is stable  - Main issue is that he is not having weight gain despite working out at the gym and taking whey protein powder   OV 05/29/2014  Chief Complaint  Patient presents with  . Follow-up    Pt states he has been SOB d/t weather. Denies cough and  CP/tightness. Pt states overall he feels he is doing well.     Not seen in over a year. OVerall well. Follows with ID. Tried to come off vori and pred in interim through ID support but became symptomatic. HE is resigned to taking these meds life long but hopes "one day" his disease will go into remission.  Spirometry fev1 1.55L/37% and ratio 52; severe obstruction unchanged from baseline. HE is compliant with his mdi.  He is doing his job as Biomedical scientist. He likes his dulera but wondered if the newer mdi are "stronger"   Review of Systems  Constitutional: Negative for fever and unexpected weight change.  HENT: Negative for congestion, dental problem, ear pain, nosebleeds, postnasal drip, rhinorrhea, sinus pressure, sneezing, sore throat and trouble swallowing.   Eyes: Negative for redness and itching.  Respiratory: Positive for shortness of breath. Negative for cough, chest tightness and wheezing.   Cardiovascular:  Negative for palpitations and leg swelling.  Gastrointestinal: Negative for nausea and vomiting.  Genitourinary: Negative for dysuria.  Musculoskeletal: Negative for joint swelling.  Skin: Negative for rash.  Neurological: Negative for headaches.  Hematological: Does not bruise/bleed easily.  Psychiatric/Behavioral: Negative for dysphoric mood. The patient is not nervous/anxious.        Objective:   Physical Exam Filed Vitals:   05/24/14 0908  BP: 118/80  Pulse: 68  Height: 5' 10" (1.778 m)  Weight: 139 lb 9.6 oz (63.322 kg)  SpO2: 95%      HENT:  Head: Normocephalic and atraumatic.  Right Ear: External ear normal.  Left Ear: External ear normal.  Mouth/Throat: Oropharynx is clear and moist. No oropharyngeal exudate.       Acne on face ,  Eyes: Conjunctivae and EOM are normal. Pupils are equal, round, and reactive to light. Right eye exhibits no discharge. Left eye exhibits no discharge. No scleral icterus.  Neck: Normal range of motion. Neck supple. No JVD present. No  tracheal deviation present. No thyromegaly present.  Cardiovascular: Normal rate, regular rhythm and intact distal pulses.  Exam reveals no gallop and no friction rub.   No murmur heard. Pulmonary/Chest: Effort normal and breath sounds normal. No respiratory distress. He has no wheezes. He has no rales. He exhibits no tenderness.       Mild barrell chest. Mild occasional wheeze  Abdominal: Soft. Bowel sounds are normal. He exhibits no distension and no mass. There is no tenderness. There is no rebound and no guarding.  Musculoskeletal: Normal range of motion. He exhibits no edema and no tenderness.  Lymphadenopathy:    He has no cervical adenopathy.  Neurological: He is alert and oriented to person, place, and time. He has normal reflexes. No cranial nerve deficit. Coordination normal.  Skin: Skin is warm and dry. No rash noted. He is not diaphoretic. No erythema. No pallor.       tatoos  Psychiatric: He has a normal mood and affect. His behavior is normal. Judgment and thought content normal.           Assessment & Plan:  Glad you are doing well and symptoms well controlled Lung function is severely impaired but stable Use albuterol as needed Continue daily prednisone and voricanazole as before Continue dulera  2 puff twice daily (try BREO For a month if interested)  Followup   9 months or call sooner if needed

## 2014-05-29 NOTE — Assessment & Plan Note (Signed)
Glad you are doing well and symptoms well controlled Lung function is severely impaired but stable Use albuterol as needed Continue daily prednisone and voricanazole as before Continue dulera  2 puff twice daily (try BREO For a month if interested)  Followup   9 months or call sooner if needed

## 2014-06-08 ENCOUNTER — Ambulatory Visit: Payer: BC Managed Care – PPO | Admitting: Infectious Disease

## 2014-06-09 ENCOUNTER — Ambulatory Visit: Payer: BC Managed Care – PPO | Admitting: Infectious Disease

## 2014-07-05 ENCOUNTER — Encounter: Payer: Self-pay | Admitting: Infectious Disease

## 2014-07-05 ENCOUNTER — Ambulatory Visit (INDEPENDENT_AMBULATORY_CARE_PROVIDER_SITE_OTHER): Payer: BC Managed Care – PPO | Admitting: Infectious Disease

## 2014-07-05 VITALS — BP 116/80 | HR 67 | Temp 98.2°F | Wt 142.0 lb

## 2014-07-05 DIAGNOSIS — A310 Pulmonary mycobacterial infection: Secondary | ICD-10-CM

## 2014-07-05 DIAGNOSIS — B4481 Allergic bronchopulmonary aspergillosis: Secondary | ICD-10-CM | POA: Diagnosis not present

## 2014-07-05 DIAGNOSIS — M818 Other osteoporosis without current pathological fracture: Secondary | ICD-10-CM | POA: Diagnosis not present

## 2014-07-05 DIAGNOSIS — B449 Aspergillosis, unspecified: Secondary | ICD-10-CM | POA: Diagnosis not present

## 2014-07-05 DIAGNOSIS — A318 Other mycobacterial infections: Secondary | ICD-10-CM | POA: Diagnosis not present

## 2014-07-05 DIAGNOSIS — T380X5A Adverse effect of glucocorticoids and synthetic analogues, initial encounter: Secondary | ICD-10-CM

## 2014-07-05 DIAGNOSIS — E559 Vitamin D deficiency, unspecified: Secondary | ICD-10-CM

## 2014-07-05 LAB — COMPLETE METABOLIC PANEL WITH GFR
ALT: 24 U/L (ref 0–53)
AST: 28 U/L (ref 0–37)
Albumin: 4 g/dL (ref 3.5–5.2)
Alkaline Phosphatase: 79 U/L (ref 39–117)
BILIRUBIN TOTAL: 0.7 mg/dL (ref 0.2–1.2)
BUN: 11 mg/dL (ref 6–23)
CHLORIDE: 100 meq/L (ref 96–112)
CO2: 29 meq/L (ref 19–32)
Calcium: 8.7 mg/dL (ref 8.4–10.5)
Creat: 1.06 mg/dL (ref 0.50–1.35)
GFR, Est Non African American: 87 mL/min
Glucose, Bld: 66 mg/dL — ABNORMAL LOW (ref 70–99)
Potassium: 4.2 mEq/L (ref 3.5–5.3)
Sodium: 138 mEq/L (ref 135–145)
TOTAL PROTEIN: 6.8 g/dL (ref 6.0–8.3)

## 2014-07-05 LAB — CBC WITH DIFFERENTIAL/PLATELET
BASOS PCT: 0 % (ref 0–1)
Basophils Absolute: 0 10*3/uL (ref 0.0–0.1)
Eosinophils Absolute: 0.1 10*3/uL (ref 0.0–0.7)
Eosinophils Relative: 1 % (ref 0–5)
HCT: 48.7 % (ref 39.0–52.0)
HEMOGLOBIN: 17.4 g/dL — AB (ref 13.0–17.0)
Lymphocytes Relative: 15 % (ref 12–46)
Lymphs Abs: 1.3 10*3/uL (ref 0.7–4.0)
MCH: 30.9 pg (ref 26.0–34.0)
MCHC: 35.7 g/dL (ref 30.0–36.0)
MCV: 86.5 fL (ref 78.0–100.0)
MONO ABS: 0.5 10*3/uL (ref 0.1–1.0)
MONOS PCT: 6 % (ref 3–12)
NEUTROS PCT: 78 % — AB (ref 43–77)
Neutro Abs: 6.6 10*3/uL (ref 1.7–7.7)
Platelets: 215 10*3/uL (ref 150–400)
RBC: 5.63 MIL/uL (ref 4.22–5.81)
RDW: 14.7 % (ref 11.5–15.5)
WBC: 8.4 10*3/uL (ref 4.0–10.5)

## 2014-07-05 MED ORDER — VORICONAZOLE 200 MG PO TABS: 200.0000 mg | ORAL_TABLET | Freq: Two times a day (BID) | ORAL | Status: DC

## 2014-07-05 MED ORDER — ALENDRONATE SODIUM 70 MG PO TABS: 70.0000 mg | ORAL_TABLET | ORAL | Status: DC

## 2014-07-05 NOTE — Progress Notes (Signed)
Subjective:    Patient ID: [name], male    DOB: Jan 06, 1976, 38 y.o.   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 We had taken him off his Mycobacterium avium drugs and then he developed fevers chills and malaise last Spring (2013) and was seen by my partner Dr. Megan Salon in late April. His AFB smears and cultures obtained in the clinic on that day prior to the initiation of azithromycin ethambutol ultimately failed to grow Mycobacterium avium. He had been doing well I saw him in [place] we discontinued his azithromycin and ethambutol again. Unfortunately he again shortly thereafter developed fevers cough malaise. He started back on azithromycin ethambutol. Cultures done for AFB here were again negative. The patient however felt dramatically better having been back on azithromycin and ethambutol.   Since then he has continued to do well and he has much less cough and is without fevers or malaise when I last saw him in May of 2014.   He has no productive cough, minimal cough period at present and feels well.  He has seen Dr. Gerome Apley and PFTs were worse.   On the whole he feels about the same symptomatically as last time I saw him.  He is not on meds for his osteoporosis.     Review of Systems  Constitutional: Negative for fever, chills, diaphoresis, activity change, appetite change, fatigue and unexpected weight change.  HENT: Negative for congestion, rhinorrhea, sinus pressure, sneezing, sore throat and trouble swallowing.   Eyes: Negative for photophobia and visual disturbance.  Respiratory: Positive for cough. Negative for chest tightness, shortness of breath, wheezing  and stridor.   Cardiovascular: Negative for chest pain, palpitations and leg swelling.  Gastrointestinal: Negative for nausea, vomiting, abdominal pain, diarrhea, constipation, blood in stool, abdominal distention and anal bleeding.  Genitourinary: Negative for dysuria, hematuria, flank pain and difficulty urinating.  Musculoskeletal: Negative for arthralgias, back pain, gait problem, joint swelling and myalgias.  Skin: Negative for color change, pallor, rash and wound.  Neurological: Negative for dizziness, tremors, weakness and light-headedness.  Hematological: Negative for adenopathy. Does not bruise/bleed easily.  Psychiatric/Behavioral: Negative for behavioral problems, confusion, sleep disturbance, dysphoric mood, decreased concentration and agitation.       Objective:   Physical Exam  Constitutional: He is oriented to person, place, and time. He appears well-nourished. No distress.  HENT:  Head: Normocephalic and atraumatic.  Eyes: Conjunctivae and EOM are normal. Pupils are equal, round, and reactive to light. No scleral icterus.  Neck: Normal range of motion. Neck supple.  Cardiovascular: Normal rate, regular rhythm and normal heart sounds.  Exam reveals no gallop and no friction rub.   No murmur heard. Pulmonary/Chest: No respiratory distress. He has wheezes.  Prolonged expiratory phase  Abdominal: He exhibits no distension and no mass. There is no tenderness. There is no rebound and no guarding.  Musculoskeletal: He exhibits no edema and no tenderness.  Lymphadenopathy:    He has no cervical adenopathy.  Neurological: He is alert and oriented to person, place, and time. He exhibits normal muscle tone. Coordination normal.  Skin: Skin is warm and dry. He is not diaphoretic. No erythema. No pallor.  Psychiatric: He has a normal mood and affect. His behavior  is normal. Judgment and thought content normal.          Assessment & Plan:   Aspergilloma: continue lifelong  voriconazole  Allergic bronchopulmonary aspgergillosis: As above, lifelong voriconazole steroids per Critical medicine.  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 Recheck CMP and CBC today.  Osteoporosis with compression fractures:  START fosamax 75m daily and take vitamin D and Calcium, Check vitamin D level   I spent greater than 25 minutes with the patient including greater than 50% of time in face to face counsel of the patient and in coordination of their care.

## 2014-07-10 LAB — VITAMIN D 1,25 DIHYDROXY
VITAMIN D 1, 25 (OH) TOTAL: 64 pg/mL (ref 18–72)
Vitamin D2 1, 25 (OH)2: <8 pg/mL
Vitamin D3 1, 25 (OH)2: 64 pg/mL

## 2014-07-21 ENCOUNTER — Other Ambulatory Visit: Payer: Self-pay | Admitting: Internal Medicine

## 2014-09-28 ENCOUNTER — Ambulatory Visit: Payer: BC Managed Care – PPO | Admitting: *Deleted

## 2014-09-28 ENCOUNTER — Ambulatory Visit (INDEPENDENT_AMBULATORY_CARE_PROVIDER_SITE_OTHER)
Admission: RE | Admit: 2014-09-28 | Discharge: 2014-09-28 | Disposition: A | Discharge status: Home / Self Care | Payer: BC Managed Care – PPO | Source: Ambulatory Visit | Attending: Adult Health | Admitting: Adult Health

## 2014-09-28 ENCOUNTER — Ambulatory Visit (INDEPENDENT_AMBULATORY_CARE_PROVIDER_SITE_OTHER): Payer: BC Managed Care – PPO | Admitting: Adult Health

## 2014-09-28 ENCOUNTER — Encounter: Payer: Self-pay | Admitting: Adult Health

## 2014-09-28 VITALS — BP 112/70 | HR 74 | Temp 97.5°F | Ht 70.0 in | Wt 136.6 lb

## 2014-09-28 DIAGNOSIS — J449 Chronic obstructive pulmonary disease, unspecified: Secondary | ICD-10-CM

## 2014-09-28 DIAGNOSIS — Z23 Encounter for immunization: Secondary | ICD-10-CM

## 2014-09-28 MED ORDER — FLUTICASONE FUROATE-VILANTEROL 100-25 MCG/INH IN AEPB: 1.0000 | INHALATION_SPRAY | Freq: Every day | RESPIRATORY_TRACT | Status: AC

## 2014-09-28 MED ORDER — FLUTICASONE FUROATE-VILANTEROL 100-25 MCG/INH IN AEPB: 1.0000 | INHALATION_SPRAY | Freq: Every day | RESPIRATORY_TRACT | Status: DC

## 2014-09-28 MED ORDER — TIOTROPIUM BROMIDE MONOHYDRATE 18 MCG IN CAPS: 18.0000 ug | ORAL_CAPSULE | Freq: Every day | RESPIRATORY_TRACT | Status: DC

## 2014-09-28 NOTE — Addendum Note (Signed)
Addended by: Parke Poisson E on: 09/28/2014 10:31 AM   Modules accepted: Orders, Medications

## 2014-09-28 NOTE — Addendum Note (Signed)
Addended by: Parke Poisson E on: 09/28/2014 05:42 PM   Modules accepted: Orders

## 2014-09-28 NOTE — Patient Instructions (Signed)
Restart  BREO 1 puff daily  Continue on Spiriva 1 puff daily, make sure you take this every day. Increase prednisone to 20 mg daily for 1 week then back to 10 mg daily Chest x-ray today Follow-up with [name] in 4 months and as needed Flu shot today  Please contact office for sooner follow up if symptoms do not improve or worsen or seek emergency care

## 2014-09-28 NOTE — Addendum Note (Signed)
Addended by: Parke Poisson E on: 09/28/2014 10:29 AM   Modules accepted: Orders

## 2014-09-28 NOTE — Progress Notes (Signed)
Subjective:    Patient ID: [name], male    DOB: [birth year], 38 y.o.   MRN: [id number]  HPI   Follo[place]u ABPA, MAC, Aspergilloma, Cachexia  Body mass index is 21.00 kg/(m^2). On 10/29/11 Body mass index is 21.27 kg/(m^2). on 01/30/2012 Body mass index is 20.06 kg/(m^2). on 08/14/2012 Body mass index is 19.34 kg/(m^2). on 03/09/2013 Body mass index is 20.03 kg/(m^2). on 05/29/2014         OV 06/27/2011: Tolerating prednisone 58m daily well. Feels asymptomatic or minimal symptoms. Compliant with meds. 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 [facility] - he is considering sarcoid. Possibly sent off ACE. He needs more [place] he has gotten. Fev1 today is 1.5L/38% and unchanged from last visit and no difference from 361mprednisone per day. He has been advised per Dr. GoCorrin Parkero reduce prednisone dosage which I agree with. Saw Dr VaWendie Agrestef I Dand been asked to continue Mac Rx. No further hemoptysis   OV 10/29/2011 Folloowu ABPA, MAC, Aspergilloma. Resp status overall okay but because he was doing 'well' he stopped spiriva and symbicort 1 month ago and since then more dyspneic. Compliant with MAC Rx and voricanazole for aspergillus. Due to see Dr VaWendie Agresteoon. Today Spirometry fev1 1.59L/38%, Ratio 50 which is his baseline.   REgarding [place]: got email 10/07/11 from DR GoCorrin Parkerho said, " what has happened to [name] The pathologist here was able to get the remainder of the surgical specimen and said that the aspergillus looked bad.  They saw necrotizing granulomas that he thought fit infectious driven process better than sarcoidosis although this could not be ruled out.  Given the positrive aspergillus precipins and IgE for aspergillus I think that you are left having to treat him with vori and likley some amount of prednisone and then with azoithro/ethambutol for the MAC--basically exactly what you were  doing....  Has he done OK?"  Past medical: in interim has developed back pain and T6 collpase due to prednisone. Has cut prednisone to 2093mer day and is tolerating lower doses fine. No neuro issues. Wants to cut prednisone down to 78m36mr day  Social and family: unchanged  REC Follow MAC issues with Dr Van Wendie Agrestepefully you ccan come off that treatment  Continue voricanazole  Restart spiriva and symbicort and after new year 2013 cut prednisone down to 78mg73m day. Nurse will ensure refills  I will see you in mid-feb 2012 with spirometry at followup  I will keep Dr GoverCorrin Parkered  Have flu shot today  OV 01/30/2012 Followup for above. Doing well. Working in [employer] with all medications. Occassional cough only. Appetite good. No weight loss. Tanning and exercising. No fevers.  Reviewed ID notes an per their plan if AFB culture negative, they plan to stop MAC drugs.   Recent labs 1.7/13 Ig E 800s 12/10/11 Sputum AFB  Smear and culture negative (prelim) 12/10/11 Fungal sputum culture - negative (final) 01/30/2012 spirometry - fev1 1.7L/41%, ratip 52 - BEST EVER 01/30/2012 walking desaturation test: 185 feet x 3 laps: did not deaturate  Past, Family, Social reviewed: no change since last visit other than noted in HPI      You are better than before  For ABPA, continue voriconazole, prednisone and inhalers  For MAC, it is possible that DR Van DWendie Agrestet stop the TB drugs -  please ensure you followup with him  REturn to see me in 6 months with spirometry and walk test at followup  Come sooner if there are problems  OV 08/14/2012  Saw ID: came off MAC drugs in April 2013 and within 2 weeks developed cough, fatigue, malaise, sputum. Says sputum negative for MAC. Then same thing in august : came off MAC Rx 06/19/12 but by 07/02/12 called ID doc back with fever 104, SIRS symptoms, cough and restarted MAC rx.  AFB smear 9/.4/13 negative for aFB with culture in progress. This has  now  happened on 2 challenges and each time when MAC drugs reintroduced symptoms of fever, cough, resolved and energy levels improved. With august trial off MAC RXs he lost 15# but now  slowly gaining weight; gained 5# back. He continues to be aware that lung transplant is a "last resort"  Spiro fev1 1.7L/40%, ratio 55 which is his baseline. Is compliant with symbicort and spiriva. Needs refills.  Has not had flu shot but will have it today 08/14/2012  Past, Family, Social reviewed: no change since last visit other than HPI. No admist. No ER visits. No hospitalizations. Continues to work in [place] For ABPA, continue voriconazole, prednisone and inhalers - take refills on spiriva and symbicort  For MAC,continue TB drugs per ID  General health: because you are on chronic prednisone talk to your PMD .Provider Not In System about health issues on prednisone especially regarding bones  Have flu shot today 08/14/2012  REturn to see me in 6 months with spirometry and walk test at followup  Come sooner if there are problems    OV 03/09/2013   - Followup for all of the above. He continues to do well with stable respiratory symptoms which is occasional mild wheezing with heavy exertion. 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 So far he is tolerating it fine. He is yet to sort out osteoporosis management with his primary care physician who is at [facility]. In fact he is asking for a switch to [facility] primary care. Spirometry fev1.65L/39%, R 55 and is stable  - Main issue is that he is not having weight gain despite working out at the gym and taking whey protein powder   OV 05/29/2014  Chief Complaint  Patient presents with  . Follow-up    Pt states he has been SOB d/t weather. Denies cough and  CP/tightness. Pt states overall he feels he is doing well.    Not seen in over a year. OVerall well. Follows with ID. Tried to come off vori and pred in interim through ID support but became symptomatic. HE is resigned to taking these meds life long but hopes "one day" his disease will go into remission.  Spirometry fev1 1.55L/37% and ratio 52; severe obstruction unchanged from baseline. HE is compliant with his mdi.  He is doing his job as Biomedical scientist. He likes his dulera but wondered if the newer mdi are "stronger"  09/28/2014 Acute OV  Complains of MR pt here for increased SOB x1 month with tightness, dark green/yellow mucus production in the mornings, fatigue.   denies f/c/s, n/v/d, hemoptysis.  Mucus comes and goes. Ranges in colors from clear to green at times. No fever .  Admits that he does not take medications correctly. Misses doses of both  inhalers.  Feels that he has been more tired over the last month. He denies any chest pain, orthopnea, PND or leg swelling Has been working 70-80 hours a week. Typically exercises but has not had the time to go.    Review of Systems  Constitutional: Negative for fever and unexpected weight change.  HENT: Negative for congestion, dental problem, ear pain, nosebleeds, postnasal drip, rhinorrhea, sinus pressure, sneezing, sore throat and trouble swallowing.   Eyes: Negative for redness and itching.  Respiratory: Positive for shortness of breath. Negative for cough, chest tightness and wheezing.   Cardiovascular: Negative for palpitations and leg swelling.  Gastrointestinal: Negative for nausea and vomiting.  Genitourinary: Negative for dysuria.  Musculoskeletal: Negative for joint swelling.  Skin: Negative for rash.  Neurological: Negative for headaches.  Hematological: Does not bruise/bleed easily.  Psychiatric/Behavioral: Negative for dysphoric mood. The patient is not nervous/anxious.        Objective:   Physical Exam     HENT:  Head:  Normocephalic and atraumatic.  Right Ear: External ear normal.  Left Ear: External ear normal.  Mouth/Throat: Oropharynx is clear and moist. No oropharyngeal exudate.   Eyes: Conjunctivae and EOM are normal. Pupils are equal, round, and reactive to light. Right eye exhibits no discharge. Left eye exhibits no discharge. No scleral icterus.  Neck: Normal range of motion. Neck supple. No JVD present. No tracheal deviation present. No thyromegaly present.  Cardiovascular: Normal rate, regular rhythm and intact distal pulses.  Exam reveals no gallop and no friction rub.   No murmur heard. Pulmonary/Chest: Effort normal and breath sounds normal. No respiratory distress. He has no wheezes. He has no rales. He exhibits no tenderness.       Mild barrell chest. No wheezing .  Abdominal: Soft. Bowel sounds are normal. He exhibits no distension and no mass. There is no tenderness. There is no rebound and no guarding.  Musculoskeletal: Normal range of motion. He exhibits no edema and no tenderness.  Lymphadenopathy:    He has no cervical adenopathy.  Neurological: He is alert and oriented to person, place, and time. He has normal reflexes. No cranial nerve deficit. Coordination normal.  Skin: Skin is warm and dry. No rash noted. He is not diaphoretic. No erythema. No pallor.   Psychiatric: He has a normal mood and affect. His behavior is normal. Judgment and thought content normal.           Assessment & Plan:

## 2014-09-28 NOTE — Assessment & Plan Note (Signed)
Flare off therapy  Encouraged on medication compliance  Chest xray today   Plan  Restart  BREO 1 puff daily  Continue on Spiriva 1 puff daily, make sure you take this every day. Increase prednisone to 20 mg daily for 1 week then back to 10 mg daily Chest x-ray today Follow-up with [name] in 4 months and as needed Flu shot today  Please contact office for sooner follow up if symptoms do not improve or worsen or seek emergency care

## 2014-10-04 ENCOUNTER — Other Ambulatory Visit: Payer: Self-pay | Admitting: *Deleted

## 2014-10-04 DIAGNOSIS — B4481 Allergic bronchopulmonary aspergillosis: Secondary | ICD-10-CM

## 2014-10-04 DIAGNOSIS — A310 Pulmonary mycobacterial infection: Secondary | ICD-10-CM

## 2014-10-04 MED ORDER — VORICONAZOLE 200 MG PO TABS: 200.0000 mg | ORAL_TABLET | Freq: Two times a day (BID) | ORAL | Status: DC

## 2014-10-18 ENCOUNTER — Telehealth: Payer: Self-pay | Admitting: Internal Medicine

## 2014-10-18 MED ORDER — PREDNISONE 20 MG PO TABS: 10.0000 mg | ORAL_TABLET | Freq: Every day | ORAL | Status: DC

## 2014-10-18 NOTE — Telephone Encounter (Signed)
Called and spoke with pt and he is aware of refill of the prednisone that has been sent to the pharmacy.  Nothing further is needed.   

## 2015-01-04 ENCOUNTER — Telehealth: Payer: Self-pay | Admitting: Internal Medicine

## 2015-01-05 NOTE — Telephone Encounter (Signed)
Created in error..sorry

## 2015-01-07 ENCOUNTER — Ambulatory Visit: Payer: Self-pay | Admitting: Adult Health

## 2015-01-07 ENCOUNTER — Telehealth: Payer: Self-pay | Admitting: Internal Medicine

## 2015-01-07 NOTE — Telephone Encounter (Signed)
Spoke with pt. States that he wants to reschedule his appointment. Offered to get a message to MR about his symptoms and he declined. ROV has been rescheduled to 01/17/15 at 10:15am with TP. Nothing further was needed.

## 2015-01-17 ENCOUNTER — Ambulatory Visit: Payer: Self-pay | Admitting: Adult Health

## 2015-01-24 ENCOUNTER — Ambulatory Visit: Payer: Self-pay | Admitting: Adult Health

## 2015-01-31 ENCOUNTER — Ambulatory Visit: Payer: Self-pay | Admitting: Adult Health

## 2015-02-04 ENCOUNTER — Encounter: Payer: Self-pay | Admitting: Adult Health

## 2015-02-04 ENCOUNTER — Encounter (INDEPENDENT_AMBULATORY_CARE_PROVIDER_SITE_OTHER): Payer: Self-pay

## 2015-02-04 ENCOUNTER — Ambulatory Visit (INDEPENDENT_AMBULATORY_CARE_PROVIDER_SITE_OTHER): Payer: BLUE CROSS/BLUE SHIELD | Admitting: Adult Health

## 2015-02-04 VITALS — BP 94/70 | HR 66 | Temp 97.8°F | Ht 70.0 in | Wt 137.0 lb

## 2015-02-04 DIAGNOSIS — B449 Aspergillosis, unspecified: Secondary | ICD-10-CM | POA: Diagnosis not present

## 2015-02-04 DIAGNOSIS — B4481 Allergic bronchopulmonary aspergillosis: Secondary | ICD-10-CM | POA: Diagnosis not present

## 2015-02-04 DIAGNOSIS — A310 Pulmonary mycobacterial infection: Secondary | ICD-10-CM

## 2015-02-04 DIAGNOSIS — J449 Chronic obstructive pulmonary disease, unspecified: Secondary | ICD-10-CM | POA: Diagnosis not present

## 2015-02-04 NOTE — Assessment & Plan Note (Signed)
Compensated without flare   Plan  Continue on  BREO 1 puff daily  Continue on Spiriva 1 puff daily, make sure you take this every day. Remain on prednisone  10 mg daily Continue on current regimen.  follow up with ID clinic as planned in 2 weeks with labs.  Follow-up with [name] in 4 months with spirometry and as needed. Please contact office for sooner follow up if symptoms do not improve or worsen or seek emergency care

## 2015-02-04 NOTE — Assessment & Plan Note (Signed)
Continue on current regimen.  follow up with ID clinic as planned in 2 weeks with labs.  Follow-up with [name] in 4 months with spirometry and as needed. Please contact office for sooner follow up if symptoms do not improve or worsen or seek emergency care

## 2015-02-04 NOTE — Assessment & Plan Note (Addendum)
Continue on BREO and pred 60m daily    Last cxr 09/2014 w/ chronic changes   Plan  Continue on  BREO 1 puff daily  Remain on prednisone  10 mg daily Continue on current regimen.   Follow-up with Dr. RChase Callerin 4 months with spirometry and as needed. Please contact office for sooner follow up if symptoms do not improve or worsen or seek emergency care

## 2015-02-04 NOTE — Patient Instructions (Signed)
Continue on  BREO 1 puff daily  Continue on Spiriva 1 puff daily, make sure you take this every day. Remain on prednisone  10 mg daily Continue on current regimen.  follow up with ID clinic as planned in 2 weeks with labs.  Follow-up with [name] in 4 months with spirometry and as needed. Please contact office for sooner follow up if symptoms do not improve or worsen or seek emergency care

## 2015-02-04 NOTE — Progress Notes (Signed)
Subjective:    Patient ID: [name], male    DOB: 1976-05-18, 39 y.o.   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 On 10/29/11 Body mass index is 21.27 kg/(m^2). on 01/30/2012 Body mass index is 20.06 kg/(m^2). on 08/14/2012 Body mass index is 19.34 kg/(m^2). on 03/09/2013 Body mass index is 20.03 kg/(m^2). on 05/29/2014   OV 06/27/2011: Tolerating prednisone 16m daily well. Feels asymptomatic or minimal symptoms. Compliant with meds. 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 [facility] - he is considering sarcoid. Possibly sent off ACE. He needs more [place] has gotten. Fev1 today is 1.5L/38% and unchanged from last visit and no difference from 327mprednisone per day. He has been advised per Dr. GoCorrin Parkero reduce prednisone dosage which I agree with. Saw Dr VaWendie Agrestef I Dand been asked to continue Mac Rx. No further hemoptysis   OV 10/29/2011 [place] ABPA, MAC, Aspergilloma. Resp status overall okay but because he was doing 'well' he stopped spiriva and symbicort 1 month ago and since then more dyspneic. Compliant with MAC Rx and voricanazole for aspergillus. Due to see Dr VaWendie Agresteoon. Today Spirometry fev1 1.59L/38%, Ratio 50 which is his baseline.   [place] got email 10/07/11 from DR GoCorrin Parkerho said, " what has happened to [name] The pathologist here was able to get the remainder of the surgical specimen and said that the aspergillus looked bad.  They saw necrotizing granulomas that he thought fit infectious driven process better than sarcoidosis although this could not be ruled out.  Given the positrive aspergillus precipins and IgE for aspergillus I think that you are left having to treat him with vori and likley some amount of prednisone and then with azoithro/ethambutol for the MAC--basically exactly what you were doing....  Has he  done OK?"  Past medical: in interim has developed back pain and T6 collpase due to prednisone. Has cut prednisone to 2069mer day and is tolerating lower doses fine. No neuro issues. Wants to cut prednisone down to 74m87mr day  Social and family: unchanged  REC Follow MAC issues with Dr Van Wendie Agrestepefully you ccan come off that treatment  Continue voricanazole  Restart spiriva and symbicort and after new year 2013 cut prednisone down to 74mg70m day. Nurse will ensure refills  I will see you in mid-feb 2012 with spirometry at followup  I will keep Dr GoverCorrin Parkered  Have flu shot today  OV 01/30/2012 Followup for above. Doing well. Working in [place] with all medications. Occassional cough only. Appetite good. No weight loss. Tanning and exercising. No fevers.  Reviewed ID notes an per their plan if AFB culture negative, they plan to stop MAC drugs.   Recent labs 1.7/13 Ig E 800s 12/10/11 Sputum AFB  Smear and culture negative (prelim) 12/10/11 Fungal sputum culture - negative (final) 01/30/2012 spirometry - fev1 1.7L/41%, ratip 52 - BEST EVER 01/30/2012 walking desaturation test: 185 feet x 3 laps: did not deaturate  Past, Family, Social reviewed: no change since last visit other than noted in HPI      You are better than before  For ABPA, continue voriconazole, prednisone and inhalers  For MAC, it is possible that DR Van DWendie Agrestet stop the TB drugs - please ensure you followup with him  REturn to see me in 6 months with spirometry and walk test at followup  Come sooner if there are problems  OV 08/14/2012  Saw ID: came off MAC drugs in April 2013 and within 2 weeks developed cough, fatigue, malaise, sputum. Says sputum negative for MAC. Then same thing in august : came off MAC Rx 06/19/12 but by 07/02/12 called ID doc back with fever 104, SIRS symptoms, cough and restarted MAC rx.  AFB smear 9/.4/13 negative for aFB with culture in progress. This has now  happened on 2  challenges and each time when MAC drugs reintroduced symptoms of fever, cough, resolved and energy levels improved. With august trial off MAC RXs he lost 15# but now  slowly gaining weight; gained 5# back. He continues to be aware that lung transplant is a "last resort"  Spiro fev1 1.7L/40%, ratio 55 which is his baseline. Is compliant with symbicort and spiriva. Needs refills.  Has not had flu shot but will have it today 08/14/2012  Past, Family, Social reviewed: no change since last visit other than HPI. No admist. No ER visits. No hospitalizations. Continues to work in [place] For ABPA, continue voriconazole, prednisone and inhalers - take refills on spiriva and symbicort  For MAC,continue TB drugs per ID  General health: because you are on chronic prednisone talk to your PMD .Provider Not In System about health issues on prednisone especially regarding bones  Have flu shot today 08/14/2012  REturn to see me in 6 months with spirometry and walk test at followup  Come sooner if there are problems    OV 03/09/2013   - Followup for all of the above. He continues to do well with stable respiratory symptoms which is occasional mild wheezing with heavy exertion. 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 So far he is tolerating it fine. He is yet to sort out osteoporosis management with his primary care physician who is at [facility]. In fact he is asking for a switch to [facility]. Spirometry fev1.65L/39%, R 55 and is stable  - Main issue is that he is not having weight gain despite working out at the gym and taking whey protein powder   OV 05/29/2014  Chief Complaint  Patient presents with  . Follow-up    Pt states he has been SOB d/t weather. Denies cough and CP/tightness. Pt states  overall he feels he is doing well.    Not seen in over a year. OVerall well. Follows with ID. Tried to come off vori and pred in interim through ID support but became symptomatic. HE is resigned to taking these meds life long but hopes "one day" his disease will go into remission.  Spirometry fev1 1.55L/37% and ratio 52; severe obstruction unchanged from baseline. HE is compliant with his mdi.  He is doing his job as Biomedical scientist. He likes his dulera but wondered if the newer mdi are "stronger"  09/28/2014 Acute OV  Complains of MR pt here for increased SOB x1 month with tightness, dark green/yellow mucus production in the mornings, fatigue.   denies f/c/s, n/v/d, hemoptysis.  Mucus comes and goes. Ranges in colors from clear to green at times. No fever .  Admits that he does not take medications correctly. Misses doses of both inhalers.  Feels that he has been  more tired over the last month. He denies any chest pain, orthopnea, PND or leg swelling Has been working 70-80 hours a week. Typically exercises but has not had the time to go. >restart BREO.   02/04/2015 Follow up : MAC, ABPA , Aspergilloma  Returns for 4 month follow up .  Says overall is doing okay.  No flare of cough or wheezing.  Has ID follow up in 2 weeks.  Remains on ethambutol and azithromycin Also MAI with lifelong azithro and ethmabutol.  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  No chest pain, orthopnea, edema or hemotpysis  Energy level is doing okay  Appetite is good. No n/v/d.  Working full time.      Review of Systems  Constitutional: Negative for fever and unexpected weight change.  HENT: Negative for congestion, dental problem, ear pain, nosebleeds, postnasal drip, rhinorrhea, sinus pressure, sneezing, sore throat and trouble swallowing.   Eyes: Negative for redness and itching.  Respiratory: Positive for shortness of breath. Negative for cough,  chest tightness and wheezing.   Cardiovascular: Negative for palpitations and leg swelling.  Gastrointestinal: Negative for nausea and vomiting.  Genitourinary: Negative for dysuria.  Musculoskeletal: Negative for joint swelling.  Skin: Negative for rash.  Neurological: Negative for headaches.  Hematological: Does not bruise/bleed easily.  Psychiatric/Behavioral: Negative for dysphoric mood. The patient is not nervous/anxious.        Objective:   Physical Exam     HENT:  Head: Normocephalic and atraumatic.  Right Ear: External ear normal.  Left Ear: External ear normal.  Mouth/Throat: Oropharynx is clear and moist. No oropharyngeal exudate.   Eyes: Conjunctivae and EOM are normal. Pupils are equal, round, and reactive to light. Right eye exhibits no discharge. Left eye exhibits no discharge. No scleral icterus.  Neck: Normal range of motion. Neck supple. No JVD present. No tracheal deviation present. No thyromegaly present.  Cardiovascular: Normal rate, regular rhythm and intact distal pulses.  Exam reveals no gallop and no friction rub.   No murmur heard. Pulmonary/Chest: Effort normal and breath sounds normal. No respiratory distress. He has no wheezes. He has no rales. He exhibits no tenderness.       Mild barrell chest. No wheezing .  Abdominal: Soft. Bowel sounds are normal. He exhibits no distension and no mass. There is no tenderness. There is no rebound and no guarding.  Musculoskeletal: Normal range of motion. He exhibits no edema and no tenderness.  Lymphadenopathy:    He has no cervical adenopathy.  Neurological: He is alert and oriented to person, place, and time. He has normal reflexes. No cranial nerve deficit. Coordination normal.  Skin: Skin is warm and dry. No rash noted. He is not diaphoretic. No erythema. No pallor.   Psychiatric: He has a normal mood and affect. His behavior is normal. Judgment and thought content normal.           Assessment & Plan:

## 2015-02-04 NOTE — Assessment & Plan Note (Signed)
Continue with VFEND  cxr 09/2014 w/ chronic changes  Follow-up with [name] in 4 months with spirometry and as needed. Please contact office for sooner follow up if symptoms do not improve or worsen or seek emergency care

## 2015-02-04 NOTE — Addendum Note (Signed)
Addended by: Parke Poisson E on: 02/04/2015 10:05 AM   Modules accepted: Medications

## 2015-02-08 ENCOUNTER — Ambulatory Visit: Payer: Self-pay | Admitting: Adult Health

## 2015-02-08 ENCOUNTER — Ambulatory Visit: Payer: Self-pay | Admitting: Infectious Disease

## 2015-02-16 ENCOUNTER — Encounter: Payer: Self-pay | Admitting: Infectious Disease

## 2015-02-16 ENCOUNTER — Ambulatory Visit (INDEPENDENT_AMBULATORY_CARE_PROVIDER_SITE_OTHER): Payer: BLUE CROSS/BLUE SHIELD | Admitting: Infectious Disease

## 2015-02-16 VITALS — BP 131/84 | HR 67 | Temp 98.3°F | Ht 69.0 in | Wt 139.0 lb

## 2015-02-16 DIAGNOSIS — B4481 Allergic bronchopulmonary aspergillosis: Secondary | ICD-10-CM

## 2015-02-16 DIAGNOSIS — A318 Other mycobacterial infections: Secondary | ICD-10-CM

## 2015-02-16 DIAGNOSIS — B449 Aspergillosis, unspecified: Secondary | ICD-10-CM

## 2015-02-16 DIAGNOSIS — A310 Pulmonary mycobacterial infection: Secondary | ICD-10-CM | POA: Diagnosis not present

## 2015-02-16 LAB — CBC WITH DIFFERENTIAL/PLATELET
Basophils Absolute: 0 10*3/uL (ref 0.0–0.1)
Basophils Relative: 0 % (ref 0–1)
EOS PCT: 1 % (ref 0–5)
Eosinophils Absolute: 0.1 10*3/uL (ref 0.0–0.7)
HCT: 48.2 % (ref 39.0–52.0)
Hemoglobin: 16.9 g/dL (ref 13.0–17.0)
LYMPHS PCT: 17 % (ref 12–46)
Lymphs Abs: 1.5 10*3/uL (ref 0.7–4.0)
MCH: 30.7 pg (ref 26.0–34.0)
MCHC: 35.1 g/dL (ref 30.0–36.0)
MCV: 87.6 fL (ref 78.0–100.0)
MONOS PCT: 7 % (ref 3–12)
MPV: 8.6 fL (ref 8.6–12.4)
Monocytes Absolute: 0.6 10*3/uL (ref 0.1–1.0)
Neutro Abs: 6.8 10*3/uL (ref 1.7–7.7)
Neutrophils Relative %: 75 % (ref 43–77)
PLATELETS: 239 10*3/uL (ref 150–400)
RBC: 5.5 MIL/uL (ref 4.22–5.81)
RDW: 14.1 % (ref 11.5–15.5)
WBC: 9 10*3/uL (ref 4.0–10.5)

## 2015-02-16 LAB — COMPLETE METABOLIC PANEL WITH GFR
ALBUMIN: 3.9 g/dL (ref 3.5–5.2)
ALT: 22 U/L (ref 0–53)
AST: 32 U/L (ref 0–37)
Alkaline Phosphatase: 67 U/L (ref 39–117)
BILIRUBIN TOTAL: 0.5 mg/dL (ref 0.2–1.2)
BUN: 9 mg/dL (ref 6–23)
CO2: 30 meq/L (ref 19–32)
Calcium: 9.2 mg/dL (ref 8.4–10.5)
Chloride: 96 mEq/L (ref 96–112)
Creat: 1.04 mg/dL (ref 0.50–1.35)
GFR, Est African American: >89 mL/min
GFR, Est Non African American: 89 mL/min
Glucose, Bld: 79 mg/dL (ref 70–99)
Potassium: 3.9 mEq/L (ref 3.5–5.3)
SODIUM: 134 meq/L — AB (ref 135–145)
Total Protein: 6.9 g/dL (ref 6.0–8.3)

## 2015-02-16 NOTE — Progress Notes (Signed)
Subjective:    Patient ID: [name], male    DOB: 1976-05-28, 39 y.o.   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 We had taken him off his Mycobacterium avium drugs and then he developed fevers chills and malaise  Spring (2013) and was seen by my partner Dr. Megan Salon in late April  His AFB smears and cultures obtained in the clinic on that day prior to the initiation of azithromycin ethambutol ultimately failed to grow Mycobacterium avium. He had been doing well I saw him in [place] we discontinued his azithromycin and ethambutol again. Unfortunately he again shortly thereafter developed fevers cough malaise. He started back on azithromycin ethambutol. Cultures done for AFB here were again negative. The patient however felt dramatically better having been back on azithromycin and ethambutol.   I last saw him in January of 2015 when he was feeling relatively well.  He did have an episode of coughing up a few flecks of blood in January which prompted appt with [facility] but by time he made appt this had long since resolved. Was only a one time episode none since then.     Review of Systems  Constitutional: Negative for fever, chills, diaphoresis, activity change, appetite change, fatigue and unexpected weight change.  HENT: Negative for congestion, rhinorrhea, sinus pressure, sneezing, sore throat and trouble swallowing.   Eyes: Negative for photophobia and visual disturbance.  Respiratory: Positive for cough and shortness of breath. Negative for chest tightness, wheezing and stridor.   Cardiovascular: Negative for chest pain, palpitations and leg swelling.  Gastrointestinal:  Negative for nausea, vomiting, abdominal pain, diarrhea, constipation, blood in stool, abdominal distention and anal bleeding.  Genitourinary: Negative for dysuria, hematuria, flank pain and difficulty urinating.  Musculoskeletal: Negative for myalgias, back pain, joint swelling, arthralgias and gait problem.  Skin: Negative for color change, pallor, rash and wound.  Neurological: Negative for dizziness, tremors, weakness and light-headedness.  Hematological: Negative for adenopathy. Does not bruise/bleed easily.  Psychiatric/Behavioral: Negative for behavioral problems, confusion, sleep disturbance, dysphoric mood, decreased concentration and agitation.       Objective:   Physical Exam  Constitutional: He is oriented to person, place, and time. He appears well-nourished. No distress.  HENT:  Head: Normocephalic and atraumatic.  Eyes: Conjunctivae and EOM are normal. Pupils are equal, round, and reactive to light. No scleral icterus.  Neck: Normal range of motion. Neck supple.  Cardiovascular: Normal rate, regular rhythm and normal heart sounds.  Exam reveals no gallop and no friction rub.   No murmur heard. Pulmonary/Chest: No respiratory distress.  Prolonged expiratory phase  Abdominal: He exhibits no distension and no mass. There is no tenderness. There is no rebound and no guarding.  Musculoskeletal: He exhibits no edema or tenderness.  Lymphadenopathy:    He has no cervical adenopathy.  Neurological: He is alert and oriented to person, place, and time. He exhibits normal muscle tone. Coordination normal.  Skin: Skin is warm and dry. He is not diaphoretic. No erythema. No pallor.  Psychiatric: He has a normal mood and affect. His behavior is normal. Judgment and thought content normal.          Assessment & Plan:   Aspergilloma: continue lifelong voriconazole  Allergic bronchopulmonary aspgergillosis: As above, lifelong voriconazole steroids per Pulmonary  medicine.  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

## 2015-02-19 LAB — VORICONAZOLE QUANT BY LC/MS: Voriconazole, Quant, by LC/MS: 2.8 ug/mL (ref <0.1)

## 2015-03-28 ENCOUNTER — Other Ambulatory Visit: Payer: Self-pay | Admitting: *Deleted

## 2015-03-28 ENCOUNTER — Telehealth: Payer: Self-pay | Admitting: *Deleted

## 2015-03-28 DIAGNOSIS — B4481 Allergic bronchopulmonary aspergillosis: Secondary | ICD-10-CM

## 2015-03-28 DIAGNOSIS — A310 Pulmonary mycobacterial infection: Secondary | ICD-10-CM

## 2015-03-28 MED ORDER — VORICONAZOLE 200 MG PO TABS: 200.0000 mg | ORAL_TABLET | Freq: Two times a day (BID) | ORAL | Status: DC

## 2015-03-28 NOTE — Telephone Encounter (Signed)
Refill rx - voraconazole.

## 2015-03-30 ENCOUNTER — Telehealth: Payer: Self-pay | Admitting: *Deleted

## 2015-03-30 NOTE — Telephone Encounter (Signed)
Completed PA electronically and sent for approval.  Can take 48-72 hours for approval.

## 2015-03-31 NOTE — Telephone Encounter (Addendum)
[facility] - going to check w/ [employer] about PA.  Phone call to Dr. Tommy Medal.  Notified Dr. Tommy Medal that pt stated that he has been out of his Vfend for 11 days.  Dr. Tommy Medal asked RN pleas have M. Pham, pharmacist, calculate "reload" dose of voraconazole when pt is approved.  M. Pham calculated "reload" dose as 400 MG BID for the first day and then regular dose of 200 MG BID after that.  Waiting on return call from [place].

## 2015-03-31 NOTE — Telephone Encounter (Signed)
Perfect!

## 2015-03-31 NOTE — Telephone Encounter (Signed)
PA paperwork from [facility] received.  Paperwork was completed, signed by MD and supporting lab work attached.  Paperwork was faxed back to [facility] for approval.  Make take up to 24 hours for approval.

## 2015-03-31 NOTE — Telephone Encounter (Signed)
Received message from [facility] that medication has been shipped.  RN spoke with Jene Every, pharmacist.  [name]. [name] has spoken with the pt about how to "reload" the medication today.

## 2015-03-31 NOTE — Telephone Encounter (Signed)
[facility] received verbal confirmation of approval.  May take a long as 2 hours to receive electronic confirmation.  [facility] will ship as soon as they received electronic confirmation.  M. Pham, pharmacist is sharing "reload" dose information with the pt so that he will have it if he receives the medication over the weekend.

## 2015-05-11 ENCOUNTER — Other Ambulatory Visit: Payer: Self-pay | Admitting: Infectious Disease

## 2015-05-11 DIAGNOSIS — A310 Pulmonary mycobacterial infection: Secondary | ICD-10-CM

## 2015-07-11 ENCOUNTER — Other Ambulatory Visit: Payer: Self-pay | Admitting: Infectious Disease

## 2015-07-11 DIAGNOSIS — A310 Pulmonary mycobacterial infection: Secondary | ICD-10-CM

## 2015-08-08 ENCOUNTER — Ambulatory Visit (INDEPENDENT_AMBULATORY_CARE_PROVIDER_SITE_OTHER): Payer: BLUE CROSS/BLUE SHIELD | Admitting: Infectious Disease

## 2015-08-08 ENCOUNTER — Encounter: Payer: Self-pay | Admitting: Infectious Disease

## 2015-08-08 VITALS — BP 131/85 | HR 72 | Temp 97.9°F | Wt 139.0 lb

## 2015-08-08 DIAGNOSIS — B4481 Allergic bronchopulmonary aspergillosis: Secondary | ICD-10-CM

## 2015-08-08 DIAGNOSIS — B449 Aspergillosis, unspecified: Secondary | ICD-10-CM | POA: Diagnosis not present

## 2015-08-08 DIAGNOSIS — A310 Pulmonary mycobacterial infection: Secondary | ICD-10-CM

## 2015-08-08 DIAGNOSIS — A318 Other mycobacterial infections: Secondary | ICD-10-CM | POA: Diagnosis not present

## 2015-08-08 DIAGNOSIS — B440 Invasive pulmonary aspergillosis: Secondary | ICD-10-CM | POA: Diagnosis not present

## 2015-08-08 DIAGNOSIS — M810 Age-related osteoporosis without current pathological fracture: Secondary | ICD-10-CM

## 2015-08-08 DIAGNOSIS — L814 Other melanin hyperpigmentation: Secondary | ICD-10-CM | POA: Insufficient documentation

## 2015-08-08 DIAGNOSIS — L819 Disorder of pigmentation, unspecified: Secondary | ICD-10-CM

## 2015-08-08 HISTORY — DX: Other melanin hyperpigmentation: L81.4

## 2015-08-08 LAB — COMPLETE METABOLIC PANEL WITH GFR
ALBUMIN: 4.1 g/dL (ref 3.6–5.1)
ALK PHOS: 87 U/L (ref 40–115)
ALT: 23 U/L (ref 9–46)
AST: 28 U/L (ref 10–40)
BUN: 13 mg/dL (ref 7–25)
CHLORIDE: 98 mmol/L (ref 98–110)
CO2: 30 mmol/L (ref 20–31)
Calcium: 9.1 mg/dL (ref 8.6–10.3)
Creat: 1.04 mg/dL (ref 0.60–1.35)
GFR, EST NON AFRICAN AMERICAN: 89 mL/min (ref >=60)
GFR, Est African American: >89 mL/min (ref >=60)
GLUCOSE: 82 mg/dL (ref 65–99)
POTASSIUM: 4.4 mmol/L (ref 3.5–5.3)
SODIUM: 139 mmol/L (ref 135–146)
Total Bilirubin: 0.7 mg/dL (ref 0.2–1.2)
Total Protein: 6.8 g/dL (ref 6.1–8.1)

## 2015-08-08 LAB — CBC WITH DIFFERENTIAL/PLATELET
BASOS PCT: 0 % (ref 0–1)
Basophils Absolute: 0 10*3/uL (ref 0.0–0.1)
Eosinophils Absolute: 0 10*3/uL (ref 0.0–0.7)
Eosinophils Relative: 0 % (ref 0–5)
HCT: 53.6 % — ABNORMAL HIGH (ref 39.0–52.0)
HEMOGLOBIN: 18.3 g/dL — AB (ref 13.0–17.0)
Lymphocytes Relative: 19 % (ref 12–46)
Lymphs Abs: 1.4 10*3/uL (ref 0.7–4.0)
MCH: 30.3 pg (ref 26.0–34.0)
MCHC: 34.1 g/dL (ref 30.0–36.0)
MCV: 88.9 fL (ref 78.0–100.0)
MPV: 8.9 fL (ref 8.6–12.4)
Monocytes Absolute: 0.5 10*3/uL (ref 0.1–1.0)
Monocytes Relative: 7 % (ref 3–12)
NEUTROS ABS: 5.6 10*3/uL (ref 1.7–7.7)
NEUTROS PCT: 74 % (ref 43–77)
Platelets: 234 10*3/uL (ref 150–400)
RBC: 6.03 MIL/uL — AB (ref 4.22–5.81)
RDW: 15.1 % (ref 11.5–15.5)
WBC: 7.5 10*3/uL (ref 4.0–10.5)

## 2015-08-08 NOTE — Progress Notes (Signed)
Subjective:    Patient ID: [name], male    DOB: 11-18-1976, 39 y.o.   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 We had taken him off his Mycobacterium avium drugs and then he developed fevers chills and malaise  Spring (2013) and was seen by my partner Dr. Megan Salon in late April  His AFB smears and cultures obtained in the clinic on that day prior to the initiation of azithromycin ethambutol ultimately failed to grow Mycobacterium avium. He had been doing well I saw him in [place] 2013 we discontinued his azithromycin and ethambutol again. Unfortunately he again shortly thereafter developed fevers cough malaise. He started back on azithromycin ethambutol. Cultures done for AFB here were again negative. The patient however felt dramatically better having been back on azithromycin and ethambutol.   I last saw him in January of 2015 when he was feeling relatively well.  He did have an episode of coughing up a few flecks of blood in January which prompted appt with [facility] but by time he made appt this had long since resolved. Was only a one time episode none since then.  This is his first appointment since having been seen by me in March 2016. His last voriconazole level was therapeutic at 2. He is tolerating his medications well and without, locations.  He still occasionally does have coughing bouts but more so when the weather is changing. He is working out in the gym at least 5 days a week but tries to pace himself.  Past Medical History  Diagnosis Date  . Aspergilloma   . MAI (mycobacterium avium-intracellulare)   . Lung disease, bullous   . Esophageal reflux   .  Hypothyroidism     secondary to ablation for Graves disease  . Solar lentigo 08/08/2015   Past Surgical History  Procedure Laterality Date  . Left vats  2012    thoracotomy and LUL apical posterior segmentectomy   Family History  Problem Relation Age of Onset  . Adopted: Yes   Social History  Substance Use Topics  . Smoking status: Former Smoker -- 0.70 packs/day for 19 years    Types: Cigarettes    Quit date: 11/20/2007  . Smokeless tobacco: Former Systems developer    Types: Snuff    Quit date: 11/19/1994  . Alcohol Use: No    Allergies  Allergen Reactions  . Clarithromycin [Clarithromycin]     Adverse reaction w/ voriconazole  . Rifaximin [Rifaximin]     Adverse reaction w/ voriconazole    Current outpatient prescriptions:  .  albuterol (PROVENTIL HFA;VENTOLIN HFA) 108 (90 BASE) MCG/ACT inhaler, Inhale 2 puffs into the lungs every 6 (six) hours as needed., Disp: 1 Inhaler, Rfl: 4 .  azithromycin (ZITHROMAX) 500 MG tablet, TAKE ONE TABLET BY MOUTH ONCE DAILY, Disp: 90 tablet, Rfl: 2 .  ethambutol (MYAMBUTOL) 400 MG tablet, TAKE TWO AND ONE-HALF TABLETS BY MOUTH ONCE DAILY, Disp: 225 tablet, Rfl: 1 .  ferrous sulfate (FERRO-BOB) 325 (65 FE) MG tablet, Take 325 mg by mouth daily.  , Disp: , Rfl:  .  Fluticasone Furoate-Vilanterol (BREO ELLIPTA) 100-25 MCG/INH AEPB, Inhale 1 puff into the lungs daily., Disp: 60 each, Rfl: 6 .  levothyroxine (SYNTHROID, LEVOTHROID) 50  MCG tablet, Take 1 tablet by mouth daily., Disp: , Rfl: 0 .  predniSONE (DELTASONE) 20 MG tablet, Take 0.5 tablets (10 mg total) by mouth daily., Disp: 60 tablet, Rfl: 6 .  tiotropium (SPIRIVA HANDIHALER) 18 MCG inhalation capsule, Place 1 capsule (18 mcg total) into inhaler and inhale daily., Disp: 30 capsule, Rfl: 6 .  voriconazole (VFEND) 200 MG tablet, Take 1 tablet (200 mg total) by mouth 2 (two) times daily., Disp: 60 tablet, Rfl: 11 .  alendronate (FOSAMAX) 70 MG tablet, Take 1 tablet (70 mg total) by mouth once a week.  Take with a full glass of water on an empty stomach. (Patient not taking: Reported on 08/08/2015), Disp: 4 tablet, Rfl: 11    Review of Systems  Constitutional: Negative for fever, chills, diaphoresis, activity change, appetite change, fatigue and unexpected weight change.  HENT: Negative for congestion, rhinorrhea, sinus pressure, sneezing, sore throat and trouble swallowing.   Eyes: Negative for photophobia and visual disturbance.  Respiratory: Positive for cough and shortness of breath. Negative for chest tightness, wheezing and stridor.   Cardiovascular: Negative for chest pain, palpitations and leg swelling.  Gastrointestinal: Negative for nausea, vomiting, abdominal pain, diarrhea, constipation, blood in stool, abdominal distention and anal bleeding.  Genitourinary: Negative for dysuria, hematuria, flank pain and difficulty urinating.  Musculoskeletal: Negative for myalgias, back pain, joint swelling, arthralgias and gait problem.  Skin: Positive for rash. Negative for color change, pallor and wound.  Neurological: Negative for dizziness, tremors, weakness and light-headedness.  Hematological: Negative for adenopathy. Does not bruise/bleed easily.  Psychiatric/Behavioral: Negative for behavioral problems, confusion, sleep disturbance, dysphoric mood, decreased concentration and agitation.       Objective:   Physical Exam  Constitutional: He is oriented to person, place, and time. He appears well-nourished. No distress.  HENT:  Head: Normocephalic and atraumatic.  Eyes: Conjunctivae and EOM are normal. Pupils are equal, round, and reactive to light. No scleral icterus.  Neck: Normal range of motion. Neck supple.  Cardiovascular: Normal rate, regular rhythm and normal heart sounds.  Exam reveals no gallop and no friction rub.   No murmur heard. Pulmonary/Chest: No respiratory distress.  Prolonged expiratory phase  Abdominal: He exhibits no distension and no mass. There is no  tenderness. There is no rebound and no guarding.  Musculoskeletal: He exhibits no edema or tenderness.  Lymphadenopathy:    He has no cervical adenopathy.  Neurological: He is alert and oriented to person, place, and time. He exhibits normal muscle tone. Coordination normal.  Skin: Skin is warm and dry. He is not diaphoretic. No erythema. No pallor.     Psychiatric: He has a normal mood and affect. His behavior is normal. Judgment and thought content normal.          Assessment & Plan:   Aspergilloma: continue lifelong voriconazole and check VFEND level with CBC and  CMP today  Allergic bronchopulmonary aspgergillosis: As above, lifelong voriconazole steroids per Pulmonary medicine.  Mycobacterium avium pulmonary infection: We have been reluctant to stop these medications due to prior flares when he comes off the medication indications. Will reconsider in the future I see him back in 6 months time.  Rash on skin: appears to be "sun spots"  I spent greater than 25 minutes with the patient including greater than 50% of time in face to face counsel of the patient regarding his aspergilloma allergic bronchopulmonary aspergillosis and Mycobacterium avium infections and in coordination of their care.

## 2015-08-13 LAB — VORICONAZOLE QUANT BY LC/MS: Voriconazole, Quant, by LC/MS: 1.3 ug/mL (ref <0.1)

## 2015-08-15 ENCOUNTER — Telehealth: Payer: Self-pay | Admitting: Infectious Disease

## 2015-08-15 DIAGNOSIS — B449 Aspergillosis, unspecified: Secondary | ICD-10-CM

## 2015-08-15 MED ORDER — VORICONAZOLE 50 MG PO TABS: 100.0000 mg | ORAL_TABLET | Freq: Two times a day (BID) | ORAL | Status: DC

## 2015-08-15 NOTE — Telephone Encounter (Signed)
Discussed with Onnie Boer and would recommend going to 341m bid by him taking two 574mtablets twice daily with the voriconazole

## 2015-08-15 NOTE — Telephone Encounter (Signed)
[name], please call this patient as I am unsure what to tell him. [name]

## 2015-08-24 ENCOUNTER — Telehealth: Payer: Self-pay | Admitting: Pharmacist Clinician (PhC)/ Clinical Pharmacy Specialist

## 2015-08-24 ENCOUNTER — Other Ambulatory Visit: Payer: Self-pay | Admitting: Adult Health

## 2015-08-24 NOTE — Telephone Encounter (Signed)
Called [name] back to explain the new dose to him and the reason. I told him to call me back in 3 wks so we can do another f/u level of his voriconazole. Hopefully, it'll be >2 at that point. He is going to do that.

## 2015-10-03 ENCOUNTER — Other Ambulatory Visit: Payer: Self-pay | Admitting: Adult Health

## 2015-10-04 ENCOUNTER — Other Ambulatory Visit: Payer: Self-pay | Admitting: Adult Health

## 2015-10-05 ENCOUNTER — Telehealth: Payer: Self-pay | Admitting: Internal Medicine

## 2015-10-05 MED ORDER — FLUTICASONE FUROATE-VILANTEROL 100-25 MCG/INH IN AEPB: 1.0000 | INHALATION_SPRAY | Freq: Every day | RESPIRATORY_TRACT | Status: DC

## 2015-10-05 NOTE — Telephone Encounter (Signed)
Pt calling to check on status of med request says he has bee out for  4wk please advise.[name]

## 2015-10-05 NOTE — Telephone Encounter (Signed)
Spoke with the pt  I advised needs ov  Appt scheduled for Jan with TP since MR did not have anything available until feb and he can only do am appts I have refilled Breo to last until then

## 2015-10-13 ENCOUNTER — Inpatient Hospital Stay (HOSPITAL_COMMUNITY): Payer: BLUE CROSS/BLUE SHIELD

## 2015-10-13 ENCOUNTER — Emergency Department (HOSPITAL_BASED_OUTPATIENT_CLINIC_OR_DEPARTMENT_OTHER): Payer: BLUE CROSS/BLUE SHIELD

## 2015-10-13 ENCOUNTER — Encounter (HOSPITAL_BASED_OUTPATIENT_CLINIC_OR_DEPARTMENT_OTHER): Payer: Self-pay

## 2015-10-13 ENCOUNTER — Inpatient Hospital Stay (HOSPITAL_BASED_OUTPATIENT_CLINIC_OR_DEPARTMENT_OTHER)
Admission: EM | Admit: 2015-10-13 | Discharge: 2015-10-20 | DRG: 853 | Disposition: A | Discharge status: Home / Self Care | Payer: BLUE CROSS/BLUE SHIELD | Attending: Internal Medicine | Admitting: Internal Medicine

## 2015-10-13 DIAGNOSIS — B441 Other pulmonary aspergillosis: Secondary | ICD-10-CM | POA: Diagnosis present

## 2015-10-13 DIAGNOSIS — Z681 Body mass index (BMI) 19 or less, adult: Secondary | ICD-10-CM | POA: Diagnosis not present

## 2015-10-13 DIAGNOSIS — J439 Emphysema, unspecified: Secondary | ICD-10-CM | POA: Diagnosis present

## 2015-10-13 DIAGNOSIS — E039 Hypothyroidism, unspecified: Secondary | ICD-10-CM | POA: Diagnosis present

## 2015-10-13 DIAGNOSIS — Z79899 Other long term (current) drug therapy: Secondary | ICD-10-CM | POA: Diagnosis not present

## 2015-10-13 DIAGNOSIS — Z7952 Long term (current) use of systemic steroids: Secondary | ICD-10-CM

## 2015-10-13 DIAGNOSIS — R911 Solitary pulmonary nodule: Secondary | ICD-10-CM | POA: Diagnosis not present

## 2015-10-13 DIAGNOSIS — R042 Hemoptysis: Secondary | ICD-10-CM | POA: Diagnosis present

## 2015-10-13 DIAGNOSIS — Z881 Allergy status to other antibiotic agents status: Secondary | ICD-10-CM

## 2015-10-13 DIAGNOSIS — A310 Pulmonary mycobacterial infection: Secondary | ICD-10-CM | POA: Diagnosis present

## 2015-10-13 DIAGNOSIS — J9601 Acute respiratory failure with hypoxia: Secondary | ICD-10-CM | POA: Diagnosis present

## 2015-10-13 DIAGNOSIS — B449 Aspergillosis, unspecified: Secondary | ICD-10-CM | POA: Diagnosis present

## 2015-10-13 DIAGNOSIS — K219 Gastro-esophageal reflux disease without esophagitis: Secondary | ICD-10-CM | POA: Diagnosis present

## 2015-10-13 DIAGNOSIS — Z792 Long term (current) use of antibiotics: Secondary | ICD-10-CM

## 2015-10-13 DIAGNOSIS — B4481 Allergic bronchopulmonary aspergillosis: Secondary | ICD-10-CM | POA: Diagnosis present

## 2015-10-13 DIAGNOSIS — E43 Unspecified severe protein-calorie malnutrition: Secondary | ICD-10-CM | POA: Diagnosis present

## 2015-10-13 DIAGNOSIS — Z7983 Long term (current) use of bisphosphonates: Secondary | ICD-10-CM | POA: Diagnosis not present

## 2015-10-13 DIAGNOSIS — Z87891 Personal history of nicotine dependence: Secondary | ICD-10-CM

## 2015-10-13 DIAGNOSIS — J939 Pneumothorax, unspecified: Secondary | ICD-10-CM | POA: Diagnosis present

## 2015-10-13 LAB — COMPREHENSIVE METABOLIC PANEL
ALBUMIN: 3.5 g/dL (ref 3.5–5.0)
ALT: 14 U/L — ABNORMAL LOW (ref 17–63)
ANION GAP: 7 (ref 5–15)
AST: 24 U/L (ref 15–41)
Alkaline Phosphatase: 96 U/L (ref 38–126)
BUN: 15 mg/dL (ref 6–20)
CHLORIDE: 104 mmol/L (ref 101–111)
CO2: 28 mmol/L (ref 22–32)
Calcium: 8.4 mg/dL — ABNORMAL LOW (ref 8.9–10.3)
Creatinine, Ser: 0.93 mg/dL (ref 0.61–1.24)
GFR calc Af Amer: >60 mL/min (ref >60)
GFR calc non Af Amer: >60 mL/min (ref >60)
GLUCOSE: 136 mg/dL — AB (ref 65–99)
POTASSIUM: 3.8 mmol/L (ref 3.5–5.1)
SODIUM: 139 mmol/L (ref 135–145)
Total Bilirubin: 1.1 mg/dL (ref 0.3–1.2)
Total Protein: 7 g/dL (ref 6.5–8.1)

## 2015-10-13 LAB — CBC WITH DIFFERENTIAL/PLATELET
BASOS ABS: 0 10*3/uL (ref 0.0–0.1)
BASOS PCT: 0 %
EOS ABS: 0.1 10*3/uL (ref 0.0–0.7)
EOS PCT: 1 %
HCT: 44.9 % (ref 39.0–52.0)
Hemoglobin: 15.4 g/dL (ref 13.0–17.0)
Lymphocytes Relative: 20 %
Lymphs Abs: 1.9 10*3/uL (ref 0.7–4.0)
MCH: 30.3 pg (ref 26.0–34.0)
MCHC: 34.3 g/dL (ref 30.0–36.0)
MCV: 88.4 fL (ref 78.0–100.0)
MONO ABS: 0.4 10*3/uL (ref 0.1–1.0)
Monocytes Relative: 4 %
NEUTROS ABS: 7.4 10*3/uL (ref 1.7–7.7)
Neutrophils Relative %: 75 %
PLATELETS: 237 10*3/uL (ref 150–400)
RBC: 5.08 MIL/uL (ref 4.22–5.81)
RDW: 13.3 % (ref 11.5–15.5)
WBC: 9.7 10*3/uL (ref 4.0–10.5)

## 2015-10-13 LAB — GLUCOSE, CAPILLARY: GLUCOSE-CAPILLARY: 78 mg/dL (ref 65–99)

## 2015-10-13 LAB — I-STAT CHEM 8, ED
BUN: 18 mg/dL (ref 6–20)
CHLORIDE: 108 mmol/L (ref 101–111)
CREATININE: 0.9 mg/dL (ref 0.61–1.24)
Calcium, Ion: 0.96 mmol/L — ABNORMAL LOW (ref 1.12–1.23)
GLUCOSE: 129 mg/dL — AB (ref 65–99)
HEMATOCRIT: 47 % (ref 39.0–52.0)
HEMOGLOBIN: 16 g/dL (ref 13.0–17.0)
POTASSIUM: 3.8 mmol/L (ref 3.5–5.1)
Sodium: 137 mmol/L (ref 135–145)
TCO2: 26 mmol/L (ref 0–100)

## 2015-10-13 LAB — APTT: aPTT: 31 seconds (ref 24–37)

## 2015-10-13 LAB — EXPECTORATED SPUTUM ASSESSMENT W GRAM STAIN, RFLX TO RESP C

## 2015-10-13 LAB — EXPECTORATED SPUTUM ASSESSMENT W REFEX TO RESP CULTURE

## 2015-10-13 LAB — TYPE AND SCREEN
ABO/RH(D): B POS
Antibody Screen: NEGATIVE

## 2015-10-13 LAB — I-STAT CG4 LACTIC ACID, ED: LACTIC ACID, VENOUS: 1.44 mmol/L (ref 0.5–2.0)

## 2015-10-13 LAB — PROTIME-INR
INR: 0.95 (ref 0.00–1.49)
Prothrombin Time: 12.9 seconds (ref 11.6–15.2)

## 2015-10-13 LAB — MRSA PCR SCREENING: MRSA BY PCR: NEGATIVE

## 2015-10-13 LAB — TSH: TSH: 4.085 u[IU]/mL (ref 0.350–4.500)

## 2015-10-13 MED ORDER — BUDESONIDE 0.25 MG/2ML IN SUSP
0.5000 mg | Freq: Two times a day (BID) | RESPIRATORY_TRACT | Status: DC
  Administered 2015-10-13 – 2015-10-14 (×3): 0.5 mg via RESPIRATORY_TRACT
  Administered 2015-10-14: 0.25 mg via RESPIRATORY_TRACT
  Administered 2015-10-15 (×2): 0.5 mg via RESPIRATORY_TRACT
  Administered 2015-10-16: 0.25 mg via RESPIRATORY_TRACT
  Administered 2015-10-16 – 2015-10-20 (×8): 0.5 mg via RESPIRATORY_TRACT
  Filled 2015-10-13 (×18): qty 4

## 2015-10-13 MED ORDER — GELATIN ABSORBABLE 12-7 MM EX MISC
CUTANEOUS | Status: AC
  Filled 2015-10-13: qty 1

## 2015-10-13 MED ORDER — ALBUTEROL SULFATE (2.5 MG/3ML) 0.083% IN NEBU
2.5000 mg | INHALATION_SOLUTION | Freq: Once | RESPIRATORY_TRACT | Status: DC
  Filled 2015-10-13: qty 3

## 2015-10-13 MED ORDER — IOHEXOL 350 MG/ML SOLN
100.0000 mL | Freq: Once | INTRAVENOUS | Status: AC | PRN
  Administered 2015-10-13: 100 mL via INTRAVENOUS

## 2015-10-13 MED ORDER — MIDAZOLAM HCL 2 MG/2ML IJ SOLN
INTRAMUSCULAR | Status: AC | PRN
  Administered 2015-10-13: 1 mg via INTRAVENOUS

## 2015-10-13 MED ORDER — MIDAZOLAM HCL 2 MG/2ML IJ SOLN
INTRAMUSCULAR | Status: AC
  Filled 2015-10-13: qty 6

## 2015-10-13 MED ORDER — ARFORMOTEROL TARTRATE 15 MCG/2ML IN NEBU
15.0000 ug | INHALATION_SOLUTION | Freq: Two times a day (BID) | RESPIRATORY_TRACT | Status: DC
  Administered 2015-10-13 – 2015-10-20 (×15): 15 ug via RESPIRATORY_TRACT
  Filled 2015-10-13 (×18): qty 2

## 2015-10-13 MED ORDER — DEXTROSE 5 % IV SOLN
500.0000 mg | Freq: Once | INTRAVENOUS | Status: AC
  Administered 2015-10-13: 500 mg via INTRAVENOUS

## 2015-10-13 MED ORDER — ENSURE ENLIVE PO LIQD
237.0000 mL | Freq: Two times a day (BID) | ORAL | Status: DC
  Administered 2015-10-14 – 2015-10-19 (×9): 237 mL via ORAL

## 2015-10-13 MED ORDER — PREDNISONE 10 MG PO TABS
10.0000 mg | ORAL_TABLET | Freq: Every day | ORAL | Status: DC
  Administered 2015-10-14 – 2015-10-20 (×7): 10 mg via ORAL
  Filled 2015-10-13 (×8): qty 1

## 2015-10-13 MED ORDER — FENTANYL CITRATE (PF) 100 MCG/2ML IJ SOLN
INTRAMUSCULAR | Status: AC | PRN
  Administered 2015-10-13: 50 ug via INTRAVENOUS

## 2015-10-13 MED ORDER — VORICONAZOLE 200 MG IV SOLR: 6.0000 mg/kg | Freq: Two times a day (BID) | INTRAVENOUS | Status: DC

## 2015-10-13 MED ORDER — SODIUM CHLORIDE 0.9 % IV SOLN: INTRAVENOUS | Status: DC

## 2015-10-13 MED ORDER — ACETAMINOPHEN 500 MG PO TABS
1000.0000 mg | ORAL_TABLET | Freq: Once | ORAL | Status: AC
  Administered 2015-10-13: 1000 mg via ORAL
  Filled 2015-10-13: qty 2

## 2015-10-13 MED ORDER — FERROUS SULFATE 325 (65 FE) MG PO TABS
325.0000 mg | ORAL_TABLET | Freq: Every day | ORAL | Status: DC
Start: 2015-10-13 — End: 2015-10-20
  Administered 2015-10-14 – 2015-10-20 (×7): 325 mg via ORAL
  Filled 2015-10-13 (×8): qty 1

## 2015-10-13 MED ORDER — SODIUM CHLORIDE 0.9 % IV SOLN
INTRAVENOUS | Status: DC
  Administered 2015-10-13 – 2015-10-15 (×2): via INTRAVENOUS

## 2015-10-13 MED ORDER — HYDROCOD POLST-CPM POLST ER 10-8 MG/5ML PO SUER
5.0000 mL | Freq: Once | ORAL | Status: AC
  Administered 2015-10-13: 5 mL via ORAL
  Filled 2015-10-13: qty 5

## 2015-10-13 MED ORDER — SODIUM CHLORIDE 0.9 % IV SOLN
INTRAVENOUS | Status: AC
  Administered 2015-10-13: 10:00:00 via INTRAVENOUS

## 2015-10-13 MED ORDER — IOHEXOL 300 MG/ML  SOLN
400.0000 mL | Freq: Once | INTRAMUSCULAR | Status: AC | PRN
  Administered 2015-10-13: 200 mL via INTRA_ARTERIAL

## 2015-10-13 MED ORDER — LEVOTHYROXINE SODIUM 50 MCG PO TABS
50.0000 ug | ORAL_TABLET | Freq: Every day | ORAL | Status: DC
  Administered 2015-10-14 – 2015-10-20 (×7): 50 ug via ORAL
  Filled 2015-10-13 (×9): qty 1

## 2015-10-13 MED ORDER — SODIUM CHLORIDE 0.9 % IV BOLUS (SEPSIS)
1000.0000 mL | Freq: Once | INTRAVENOUS | Status: AC
  Administered 2015-10-13: 1000 mL via INTRAVENOUS

## 2015-10-13 MED ORDER — FENTANYL CITRATE (PF) 100 MCG/2ML IJ SOLN
INTRAMUSCULAR | Status: AC
  Filled 2015-10-13: qty 6

## 2015-10-13 MED ORDER — TIOTROPIUM BROMIDE MONOHYDRATE 18 MCG IN CAPS
18.0000 ug | ORAL_CAPSULE | Freq: Every day | RESPIRATORY_TRACT | Status: DC
  Administered 2015-10-14 – 2015-10-20 (×6): 18 ug via RESPIRATORY_TRACT
  Filled 2015-10-13 (×3): qty 5

## 2015-10-13 MED ORDER — ETHAMBUTOL HCL 400 MG PO TABS
1000.0000 mg | ORAL_TABLET | Freq: Every day | ORAL | Status: DC
  Administered 2015-10-14 – 2015-10-20 (×7): 1000 mg via ORAL
  Filled 2015-10-13 (×8): qty 2

## 2015-10-13 MED ORDER — AZITHROMYCIN 500 MG IV SOLR
INTRAVENOUS | Status: AC
  Filled 2015-10-13: qty 500

## 2015-10-13 MED ORDER — IPRATROPIUM-ALBUTEROL 0.5-2.5 (3) MG/3ML IN SOLN
3.0000 mL | Freq: Once | RESPIRATORY_TRACT | Status: DC
  Filled 2015-10-13: qty 3

## 2015-10-13 MED ORDER — VORICONAZOLE 50 MG PO TABS
300.0000 mg | ORAL_TABLET | Freq: Two times a day (BID) | ORAL | Status: DC
  Administered 2015-10-13 – 2015-10-17 (×8): 300 mg via ORAL
  Filled 2015-10-13 (×12): qty 2

## 2015-10-13 MED ORDER — LIDOCAINE HCL 1 % IJ SOLN
INTRAMUSCULAR | Status: AC
  Filled 2015-10-13: qty 20

## 2015-10-13 MED ORDER — ALBUTEROL SULFATE (2.5 MG/3ML) 0.083% IN NEBU: 2.5000 mg | INHALATION_SOLUTION | Freq: Four times a day (QID) | RESPIRATORY_TRACT | Status: DC | PRN

## 2015-10-13 NOTE — Sedation Documentation (Signed)
Patient is resting comfortably. 

## 2015-10-13 NOTE — Significant Event (Signed)
Patient to be taken to IR today. Keep NPO per Dr. Earleen Newport.

## 2015-10-13 NOTE — Sedation Documentation (Signed)
Patient is resting comfortably.

## 2015-10-13 NOTE — Sedation Documentation (Signed)
Patient denies pain and is resting comfortably.

## 2015-10-13 NOTE — ED Notes (Signed)
Pt c/o coughing up blood and cough with SOB since last night 2300, o2 sat 88% RA

## 2015-10-13 NOTE — Progress Notes (Signed)
ANTIBIOTIC CONSULT NOTE - INITIAL  Pharmacy Consult for voriconazole PO (lifelong pta) Indication: aspergilloma hx  Allergies  Allergen Reactions  . Clarithromycin [Clarithromycin]     Adverse reaction w/ voriconazole  . Rifaximin [Rifaximin]     Adverse reaction w/ voriconazole    Patient Measurements: Height: _0  (175.3 cm) Weight: 138 lb 14.2 oz (63 kg) IBW/kg (Calculated) : 70.7  Vital Signs: Temp: 100.1 F (37.8 C) (11/24 0437) Temp Source: Rectal (11/24 0437) BP: 119/84 mmHg (11/24 0730) Pulse Rate: 89 (11/24 0730) Intake/Output from previous day:   Intake/Output from this shift:    Labs:  Recent Labs  10/13/15 0400 10/13/15 0420  WBC 9.7  --   HGB 15.4 16.0  PLT 237  --   CREATININE 0.93 0.90   Estimated Creatinine Clearance: 96.3 mL/min (by C-G formula based on Cr of 0.9). No results for input(s): VANCOTROUGH, VANCOPEAK, VANCORANDOM, GENTTROUGH, GENTPEAK, GENTRANDOM, TOBRATROUGH, TOBRAPEAK, TOBRARND, AMIKACINPEAK, AMIKACINTROU, AMIKACIN in the last 72 hours.   Microbiology: Recent Results (from the past 720 hour(s))  Blood culture (routine x 2)     Status: None (Preliminary result)   Collection Time: 10/13/15  4:30 AM  Result Value Ref Range Status   Specimen Description BLOOD RIGHT ANTECUBITAL  Final   Special Requests   Final    BOTTLES DRAWN AEROBIC AND ANAEROBIC 5CC Performed at [facility]    Culture PENDING  Incomplete   Report Status PENDING  Incomplete  Blood culture (routine x 2)     Status: None (Preliminary result)   Collection Time: 10/13/15  4:35 AM  Result Value Ref Range Status   Specimen Description BLOOD BLOOD RIGHT FOREARM  Final   Special Requests   Final    BOTTLES DRAWN AEROBIC AND ANAEROBIC 5CC Performed at [facility]    Culture PENDING  Incomplete   Report Status PENDING  Incomplete    Medical History: Past Medical History  Diagnosis Date  . 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[place])   . MAI (mycobacterium  avium-intracellulare) ([place])   . Lung disease, bullous ([place])   . Esophageal reflux   . Hypothyroidism     secondary to ablation for Graves disease  . Solar lentigo 08/08/2015    Assessment: 12 yom on voriconazole pta for aspergilloma s/p VATS in 2012. Pharmacy consulted to dose voriconazole (pta). Pt seen by ID pharmacist in clinic outpatient. Per Beltrami, continue patient on voriconazole PO home dose (no load). Patient was supratherapeutic on higher dose previously. Will be on med life-long. Currently taking PO meds ok. CXR - L apical mass concerning for new fungal ball per CCM, doubt TB.  Goal of Therapy:  Adequate therapy  Plan:  Voriconazole 350m PO bid (keep pta dose per [name]) F/u temp curve, wbc trend, c/s, clinical progress  HElicia Lamp PharmD Clinical Pharmacist Pager [phone]/24/2016 1:55 PM

## 2015-10-13 NOTE — H&P (Signed)
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    The patient reports he was in his usual state of health on 11/23 - chronic baseline cough.  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  He woke around 3 am on 11/24 and experienced a cough with hemoptysis.  He reports "a lot of bright red blood" and he got light headed but did not pass out.  He estimates approximately 100-200 ml of blood.  The patient called his father and had him drive him to the hospital.    On arrival to ER, he was noted to have saturations of 88% on room air.  CXR evaluation notable for a left apical mass (3.9 cm), pleural thickening near lung mass, apical pleural parenchymal scarring & bullous disease, ? Changes on R from surgical resection.  He was further evaluated with a CTA of the chest which demonstrated no PE, new dense layering airspace in L apical bulla concerning for fungal ball, numerous scattered nodules, loculated L sided pneumothorax decreased in size, small R basilar pneumothorax, and large bilateral bullae.  Labs:  Na 137, K 3.8, Sr Cr 0.90, glucose 129, lactic acid 1.44, WBC 9.7, Hgb 15.4 and platelets 237.    He denies chest pain, pain with inspiration, nausea, vomiting, diarrhea.  He reports he has been taking his medications as prescribed.  He endorses approximately 10 lb weight loss since October 2016.   PAST MEDICAL HISTORY :  He  has a past medical history of 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[place]); MAI (mycobacterium  avium-intracellulare) ([place]); Lung disease, bullous ([place]); Esophageal reflux; Hypothyroidism; and Solar lentigo (08/08/2015).  PAST SURGICAL HISTORY: He  has past surgical history that includes left VATS (2012).  Allergies  Allergen Reactions  . Clarithromycin [Clarithromycin]     Adverse reaction w/ voriconazole  . Rifaximin [Rifaximin]     Adverse reaction w/ voriconazole    No current facility-administered medications on file prior to encounter.   Current Outpatient Prescriptions on File Prior to Encounter  Medication Sig  . albuterol (PROVENTIL HFA;VENTOLIN HFA) 108 (90 BASE) MCG/ACT inhaler Inhale 2 puffs into the lungs every 6 (six) hours as needed.  Marland Kitchen alendronate (FOSAMAX) 70 MG tablet Take 1 tablet (70 mg total) by mouth once a week. Take with a full glass of water on an empty stomach. (Patient not taking: Reported on 08/08/2015)  . azithromycin (ZITHROMAX) 500 MG tablet TAKE ONE TABLET BY MOUTH ONCE DAILY  . ethambutol (MYAMBUTOL) 400 MG tablet TAKE TWO AND ONE-HALF TABLETS BY MOUTH ONCE DAILY  . ferrous sulfate (FERRO-BOB) 325 (65 FE) MG tablet Take 325 mg by mouth daily.    . Fluticasone Furoate-Vilanterol (BREO ELLIPTA) 100-25 MCG/INH AEPB Take 1 puff by mouth daily.  Marland Kitchen levothyroxine (SYNTHROID, LEVOTHROID) 50 MCG tablet Take 1 tablet by mouth daily.  . predniSONE (DELTASONE) 20 MG tablet Take 0.5 tablets (10 mg total) by mouth daily.  Marland Kitchen tiotropium (SPIRIVA HANDIHALER) 18 MCG inhalation capsule Place 1 capsule (18 mcg  total) into inhaler and inhale daily.  Marland Kitchen voriconazole (VFEND) 200 MG tablet Take 1 tablet (200 mg total) by mouth 2 (two) times daily.  Marland Kitchen voriconazole (VFEND) 50 MG tablet Take 2 tablets (100 mg total) by mouth 2 (two) times daily. Take with the 273m bid for 3080mbid    FAMILY HISTORY:  His is adopted.  SOCIAL HISTORY: He  reports that he quit smoking about 7 years ago. His smoking use included Cigarettes. He has a 13.3 pack-year smoking history. He quit  smokeless tobacco use about 20 years ago. His smokeless tobacco use included Snuff. He reports that he does not drink alcohol or use illicit drugs.  REVIEW OF SYSTEMS:   Gen: Denies fever, chills, weight change, fatigue, night sweats HEENT: Denies blurred vision, double vision, hearing loss, tinnitus, sinus congestion, rhinorrhea, sore throat, neck stiffness, dysphagia PULM: Denies shortness of breath, sputum production, wheezing.  Reports cough, hemoptysis 100-200 ml bright red blood CV: Denies chest pain, edema, orthopnea, paroxysmal nocturnal dyspnea, palpitations GI: Denies abdominal pain, nausea, vomiting, diarrhea, hematochezia, melena, constipation, change in bowel habits GU: Denies dysuria, hematuria, polyuria, oliguria, urethral discharge Endocrine: Denies hot or cold intolerance, polyuria, polyphagia or appetite change Derm: Denies rash, dry skin, scaling or peeling skin change Heme: Denies easy bruising, bleeding, bleeding gums Neuro: Denies headache, numbness, weakness, slurred speech, loss of memory or consciousness   SUBJECTIVE:   VITAL SIGNS: BP 119/84 mmHg  Pulse 89  Temp(Src) 100 F (37.8 C) (Oral)  Resp 17  Ht _0  (1.753 m)  Wt 130 lb 11.7 oz (59.3 kg)  BMI 19.30 kg/m2  SpO2 97%  HEMODYNAMICS:    VENTILATOR SETTINGS:    INTAKE / OUTPUT:    PHYSICAL EXAMINATION: General:  Thin adult male in NAD  Neuro:  AAOx4, speech clear, MAE  HEENT:  MM pink/moist, no JVD  Cardiovascular:  s1s2 rrr, no m/r/g Lungs:  resp's even/non-labored, diminished upper Abdomen:  NTND, bsx4 active  Musculoskeletal:  No acute deformities  Skin:  Warm/dry, ruddy coloring, multiple tattoos, no rashes or lesions   LABS:  CBC  Recent Labs Lab 10/13/15 0400 10/13/15 0420  WBC 9.7  --   HGB 15.4 16.0  HCT 44.9 47.0  PLT 237  --    Coag's  Recent Labs Lab 10/13/15 0400  APTT 31  INR 0.95   BMET  Recent Labs Lab 10/13/15 0400 10/13/15 0420  NA 139 137  K 3.8  3.8  CL 104 108  CO2 28  --   BUN 15 18  CREATININE 0.93 0.90  GLUCOSE 136* 129*   Electrolytes  Recent Labs Lab 10/13/15 0400  CALCIUM 8.4*   Sepsis Markers  Recent Labs Lab 10/13/15 0416  LATICACIDVEN 1.44   ABG No results for input(s): PHART, PCO2ART, PO2ART in the last 168 hours. Liver Enzymes  Recent Labs Lab 10/13/15 0400  AST 24  ALT 14*  ALKPHOS 96  BILITOT 1.1  ALBUMIN 3.5   Cardiac Enzymes No results for input(s): TROPONINI, PROBNP in the last 168 hours. Glucose  Recent Labs Lab 10/13/15 0932  GLUCAP 78    Imaging Ct Angio Chest Pe W/cm &/or Wo Cm  10/13/2015  CLINICAL DATA:  Acute onset of hemoptysis, chest tightness and shortness of breath. Initial encounter. EXAM: CT ANGIOGRAPHY CHEST WITH CONTRAST TECHNIQUE: Multidetector CT imaging of the chest was performed using the standard protocol during bolus administration of intravenous contrast. Multiplanar CT image reconstructions and MIPs were obtained to evaluate the vascular  anatomy. CONTRAST:  135m OMNIPAQUE IOHEXOL 350 MG/ML SOLN COMPARISON:  Chest radiograph performed earlier today at 4:25 a.m., and CT of the chest performed 03/05/2011 FINDINGS: There is no evidence of pulmonary embolus. The previously noted loculated left-sided pneumothorax has decreased significantly in size. The large bilateral bullae are again noted. There is new dense layering airspace opacification at the large left apical bulla, extending inferiorly along the posterior aspect of the left upper lobe, suspicious for acute fungal infection given its appearance. 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 This is in a slightly different distribution from the prior study, but appears relatively stable. There is no evidence of pleural effusion. A new small right basilar pneumothorax is noted. Vague borderline prominent aortopulmonary window and subcarinal nodes  are suggested, measuring up to 1.1 cm in short axis. The mediastinum is otherwise unremarkable. No pericardial effusion is identified. The great vessels are grossly unremarkable. No axillary lymphadenopathy is seen. The thyroid gland is unremarkable in appearance. The visualized portions of the liver and spleen are unremarkable. The visualized portions of the pancreas, stomach, adrenal glands and left kidney are within normal limits. No acute osseous abnormalities are seen. Review of the MIP images confirms the above findings. IMPRESSION: 1. No evidence of pulmonary embolus. 2. New dense layering airspace opacification at the patient's large left apical bulla, extending inferiorly along the posterior aspect of the left upper lobe. This is suspicious for acute fungal infection (i.e., a fungal ball), given its appearance. 3. 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 4. Previously noted loculated left-sided pneumothorax has decreased significantly in size. 5. New small right basilar pneumothorax noted. 6. Underlying large bilateral bullae again noted. 7. Suggestion of mildly prominent mediastinal nodes. These results were called by telephone at the time of interpretation on 10/13/2015 at 6:05 am to Dr. KPryor Curia who verbally acknowledged these results. Electronically Signed   By: JGarald BaldingM.D.   On: 10/13/2015 06:05   Dg Chest Portable 1 View  10/13/2015  CLINICAL DATA:  Acute onset of hemoptysis and shortness of breath. Initial encounter. EXAM: PORTABLE CHEST 1 VIEW COMPARISON:  Chest radiograph performed 09/28/2014 FINDINGS: There appears to be an enlarging 3.9 cm mass near the left lung apex. Biapical pleural parenchymal scarring and bullous change are again seen, with somewhat worsening bilateral pulmonary nodularity. Pleural thickening near the left lung mass appears worsened, raising concern for local spread of disease. No  pleural effusion or pneumothorax is seen. The cardiomediastinal silhouette is normal in size. No acute osseous abnormalities are identified. A chronic left fourth rib deformity is noted. IMPRESSION: 1. Apparent enlarging 3.9 cm mass near the left lung apex, concerning for malignancy. Pleural thickening near the left lung mass appears worsened, raising concern for local spread of disease. CT of the chest would be helpful for further evaluation, when and as deemed clinically appropriate. 2. Somewhat worsening bilateral pulmonary nodularity noted. Biapical pleural parenchymal scarring and bullous change again noted. Electronically Signed   By: JGarald BaldingM.D.   On: 10/13/2015 05:22     STUDIES:  11/24  CTA Chest >> no PE, new dense layering airspace in L apical bulla concerning for fungal ball, numerous scattered nodules, loculated L sided pneumothorax decreased in size, small R basilar pneumothorax, and large bilateral bullae.     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     ASSESSMENT / PLAN:  PULMONARY A: Hemoptysis - acute episode 11/24 am, approx 100-200 ml  Acute Hypoxemia - in setting of hemoptysis  Concern for new Fungal Ball - see 11/24 CT ABPA - on chronic prednisone  MAC - on azithro / ethambutol P:   Pulmonary hygiene Continue chronic medications - azithromycin, ethambutol, prednisone  Intermittent CXR  Will discuss embolization with Dr. Barbie Banner  Budesonide / Pulmicort  Spiriva  Q6 PRN Albuterol  Sputum for AFB x 3 - 1st ordered 11/24   CARDIOVASCULAR A:  No acute issues P:  ICU monitoring of hemodynamics    RENAL A:   No acute issues  P:   Trend BMP / UOP  NS @ 75 ml/hr  GASTROINTESTINAL A:   Protein Calorie Malnutrition  Weight Loss - approx 10 lbs since 08/2015 P:   Regular diet as tolerated once timing of procedures determined  Ensure  Nutrition consult   HEMATOLOGIC A:   No acute issues  P:  Continue home ferrous sulfate  Monitor CBC  SCD's for DVT prophylaxis   INFECTIOUS A:   Concern for new aspergilloma  Hx MAC, ABPA  P:   Follow cultures as above   ENDOCRINE A:   Hypothyroidism  P:   Continue synthroid  Assess TSH   NEUROLOGIC A:   No acute issues  P:   RASS goal: n/a Monitor / supportive care    FAMILY  - Updates: Patient updated on plan of care.    - Inter-disciplinary family meet or Palliative Care meeting due by:   12/2    Noe Gens, NP-C [facility] Pgr: [phone] or if no answer [phone] 10/13/2015, 10:27 AM    STAFF NOTE: I, Merrie Roof, MD FACP have personally reviewed patient's available data, including medical history, events of note, physical examination and test results as part of my evaluation. I have discussed with resident/NP and other care providers such as pharmacist, RN and RRT. In addition, I personally evaluated patient and elicited key findings of: bright red blood reported over 300 cc, no distress now, lung sounds distant, coags wnl, hct stable, r/o new aspergilloma as source, doubt TB, CT reviewed in full, major bullae, updated and d/w primary pulmonary Dr Chase Caller, we both agree that if we place ETT and bronch for location of hemoptysis then he is likley to NOT liberate from vent and PTX risk high , called IR , in our minds risk contrast delivery bilateral is lowest, consider straight to IR and procede with bilateral angio, keep NPO, d/w pt agrees The patient is critically ill with multiple organ systems failure and requires high complexity decision making for assessment and support, frequent  evaluation and titration of therapies, application of advanced monitoring technologies and extensive interpretation of multiple databases.   Critical Care Time devoted to patient care services described in this note is30 Minutes. This time reflects time of care of this signee: Merrie Roof, MD FACP. This critical care time does not reflect procedure time, or teaching time or supervisory time of PA/NP/Med student/Med Resident etc but could involve care discussion time. Rest per NP/medical resident whose note is outlined above and that I agree with   Lavon Paganini. Titus Mould,  MD, FACP Pgr: [phone] [facility] 10/13/2015 12:18 PM

## 2015-10-13 NOTE — ED Notes (Signed)
[facility] here at this time.

## 2015-10-13 NOTE — H&P (Signed)
Chief Complaint: Hemoptysis  Referring Physician(s): Dr. Titus Mould  History of Present Illness: [name] is a 39 y.o. 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  He presented to the ED with cough that started around 11 PM last night.   He reports coughing up bright red blood. Upon my visit, he states he hasn't had any hemoptysis "in a few hours".  He thinks he coughed up approximately 100 mL of blood prior to arrival.   He denies anticoagulation. He denies missing any of his antibiotics or antifungals.   His pulmonologist is Dr. Lavell Anchors. ID is Dr. Tommy Medal. PCP is with [facility].   He denies any known fever. No chest pain. He does not wear oxygen at home.  We are asked to evaluate him for possible bronchial artery embolization to treat hemoptysis.  Past Medical History  Diagnosis Date  . 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[place])   . MAI (mycobacterium avium-intracellulare) ([place])   . Lung disease, bullous ([place])   . Esophageal reflux   . Hypothyroidism     secondary to ablation for Graves disease  . Solar lentigo 08/08/2015    Past Surgical History  Procedure Laterality Date  . Left vats  2012    thoracotomy and LUL apical posterior segmentectomy    Allergies: Clarithromycin and Rifaximin  Medications: Prior to Admission medications   Medication Sig Start Date End Date Taking? Authorizing Provider  albuterol (PROVENTIL HFA;VENTOLIN HFA) 108 (90 BASE) MCG/ACT inhaler Inhale 2 puffs into the lungs every 6 (six) hours as needed. 05/24/14   Brand Males, MD  alendronate (FOSAMAX) 70 MG tablet Take 1 tablet (70 mg total) by mouth once a week. Take with a full glass of water on an empty stomach. Patient not taking: Reported on 08/08/2015 07/05/14   Truman Hayward, MD  azithromycin [facility]) 500 MG tablet TAKE ONE TABLET BY MOUTH ONCE DAILY 05/11/15    Truman Hayward, MD  ethambutol (MYAMBUTOL) 400 MG tablet TAKE TWO AND ONE-HALF TABLETS BY MOUTH ONCE DAILY 07/11/15   Truman Hayward, MD  ferrous sulfate (FERRO-BOB) 325 (65 FE) MG tablet Take 325 mg by mouth daily.      Historical Provider, MD  Fluticasone Furoate-Vilanterol (BREO ELLIPTA) 100-25 MCG/INH AEPB Take 1 puff by mouth daily. 10/05/15   Tanda Rockers, MD  levothyroxine (SYNTHROID, LEVOTHROID) 50 MCG tablet Take 1 tablet by mouth daily. 09/27/14   Historical Provider, MD  predniSONE (DELTASONE) 20 MG tablet Take 0.5 tablets (10 mg total) by mouth daily. 10/18/14   Brand Males, MD  tiotropium (SPIRIVA HANDIHALER) 18 MCG inhalation capsule Place 1 capsule (18 mcg total) into inhaler and inhale daily. 09/28/14   Tammy S Parrett, NP  voriconazole (VFEND) 200 MG tablet Take 1 tablet (200 mg total) by mouth 2 (two) times daily. 03/28/15   Truman Hayward, MD  voriconazole (VFEND) 50 MG tablet Take 2 tablets (100 mg total) by mouth 2 (two) times daily. Take with the 272m bid for 3026mbid 08/15/15   CoTruman HaywardMD     Family History  Problem Relation Age of Onset  . Adopted: Yes    Social History   Social History  . Marital Status: Single    Spouse Name: N/A  . Number of Children: N/A  . Years of Education: N/A   Occupational History  . works 2 jobs  Social History Main Topics  . Smoking status: Former Smoker -- 0.70 packs/day for 19 years    Types: Cigarettes    Quit date: 11/20/2007  . Smokeless tobacco: Former Systems developer    Types: Snuff    Quit date: 11/19/1994  . Alcohol Use: No  . Drug Use: No  . Sexual Activity: Not Asked   Other Topics Concern  . None   Social History Narrative   Pt is adopted     Review of Systems  Constitutional: Negative for fever, chills, activity change and appetite change.  HENT: Negative.   Respiratory: Positive for cough and shortness of breath.        + Hemoptysis last night, but denies any in a "few hours"    Cardiovascular: Negative for chest pain.  Gastrointestinal: Negative for nausea, vomiting, abdominal pain and abdominal distention.  Genitourinary: Negative.   Musculoskeletal: Negative.   Skin: Negative.   Neurological: Negative.   Psychiatric/Behavioral: Negative.     Vital Signs: BP 107/69 mmHg  Pulse 81  Temp(Src) 100 F (37.8 C) (Oral)  Resp 16  Ht _0  (1.753 m)  Wt 130 lb 11.7 oz (59.3 kg)  BMI 19.30 kg/m2  SpO2 99%  Physical Exam  Constitutional:  Thin, No a cute distress  HENT:  Head: Normocephalic and atraumatic.  Eyes: EOM are normal.  Neck: Normal range of motion. Neck supple.  Pulmonary/Chest: Effort normal.  Breath sounds diminshed at apices.   Abdominal: Soft. Bowel sounds are normal. He exhibits no distension. There is no tenderness.  Skin: Skin is warm and dry.  Psychiatric: He has a normal mood and affect. His behavior is normal. Judgment and thought content normal.  Vitals reviewed.   Mallampati Score:  MD Evaluation Airway: WNL Heart: WNL Abdomen: WNL Chest/ Lungs: Other (comments) Chest/ lungs comments: Breath sounds diminshed at apices ASA  Classification: 3 Mallampati/Airway Score: Two  Imaging: Ct Angio Chest Pe W/cm &/or Wo Cm  10/13/2015  CLINICAL DATA:  Acute onset of hemoptysis, chest tightness and shortness of breath. Initial encounter. EXAM: CT ANGIOGRAPHY CHEST WITH CONTRAST TECHNIQUE: Multidetector CT imaging of the chest was performed using the standard protocol during bolus administration of intravenous contrast. Multiplanar CT image reconstructions and MIPs were obtained to evaluate the vascular anatomy. CONTRAST:  160m OMNIPAQUE IOHEXOL 350 MG/ML SOLN COMPARISON:  Chest radiograph performed earlier today at 4:25 a.m., and CT of the chest performed 03/05/2011 FINDINGS: There is no evidence of pulmonary embolus. The previously noted loculated left-sided pneumothorax has decreased significantly in size. The large bilateral bullae  are again noted. There is new dense layering airspace opacification at the large left apical bulla, extending inferiorly along the posterior aspect of the left upper lobe, suspicious for acute fungal infection given its appearance. 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 This is in a slightly different distribution from the prior study, but appears relatively stable. There is no evidence of pleural effusion. A new small right basilar pneumothorax is noted. Vague borderline prominent aortopulmonary window and subcarinal nodes are suggested, measuring up to 1.1 cm in short axis. The mediastinum is otherwise unremarkable. No pericardial effusion is identified. The great vessels are grossly unremarkable. No axillary lymphadenopathy is seen. The thyroid gland is unremarkable in appearance. The visualized portions of the liver and spleen are unremarkable. The visualized portions of the pancreas, stomach, adrenal glands and left kidney are within normal limits. No acute osseous abnormalities are seen. Review of  the MIP images confirms the above findings. IMPRESSION: 1. No evidence of pulmonary embolus. 2. New dense layering airspace opacification at the patient's large left apical bulla, extending inferiorly along the posterior aspect of the left upper lobe. This is suspicious for acute fungal infection (i.e., a fungal ball), given its appearance. 3. 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 4. Previously noted loculated left-sided pneumothorax has decreased significantly in size. 5. New small right basilar pneumothorax noted. 6. Underlying large bilateral bullae again noted. 7. Suggestion of mildly prominent mediastinal nodes. These results were called by telephone at the time of interpretation on 10/13/2015 at 6:05 am to Dr. Pryor Curia, who verbally acknowledged  these results. Electronically Signed   By: Garald Balding M.D.   On: 10/13/2015 06:05   Dg Chest Portable 1 View  10/13/2015  CLINICAL DATA:  Acute onset of hemoptysis and shortness of breath. Initial encounter. EXAM: PORTABLE CHEST 1 VIEW COMPARISON:  Chest radiograph performed 09/28/2014 FINDINGS: There appears to be an enlarging 3.9 cm mass near the left lung apex. Biapical pleural parenchymal scarring and bullous change are again seen, with somewhat worsening bilateral pulmonary nodularity. Pleural thickening near the left lung mass appears worsened, raising concern for local spread of disease. No pleural effusion or pneumothorax is seen. The cardiomediastinal silhouette is normal in size. No acute osseous abnormalities are identified. A chronic left fourth rib deformity is noted. IMPRESSION: 1. Apparent enlarging 3.9 cm mass near the left lung apex, concerning for malignancy. Pleural thickening near the left lung mass appears worsened, raising concern for local spread of disease. CT of the chest would be helpful for further evaluation, when and as deemed clinically appropriate. 2. Somewhat worsening bilateral pulmonary nodularity noted. Biapical pleural parenchymal scarring and bullous change again noted. Electronically Signed   By: Garald Balding M.D.   On: 10/13/2015 05:22    Labs:  CBC:  Recent Labs  02/16/15 1016 08/08/15 1004 10/13/15 0400 10/13/15 0420  WBC 9.0 7.5 9.7  --   HGB 16.9 18.3* 15.4 16.0  HCT 48.2 53.6* 44.9 47.0  PLT 239 234 237  --     COAGS:  Recent Labs  10/13/15 0400  INR 0.95  APTT 31    BMP:  Recent Labs  02/16/15 1016 08/08/15 1004 10/13/15 0400 10/13/15 0420  NA 134* 139 139 137  K 3.9 4.4 3.8 3.8  CL 96 98 104 108  CO2 _0 --   GLUCOSE 79 82 136* 129*  BUN _1 CALCIUM 9.2 9.1 8.4*  --   CREATININE 1.04 1.04 0.93 0.90  GFRNONAA 89 89 >60  --   GFRAA >89 >89 >60  --     LIVER FUNCTION TESTS:  Recent Labs   02/16/15 1016 08/08/15 1004 10/13/15 0400  BILITOT 0.5 0.7 1.1  AST 32 28 24  ALT 22 23 14*  ALKPHOS 67 87 96  PROT 6.9 6.8 7.0  ALBUMIN 3.9 4.1 3.5    TUMOR MARKERS: No results for input(s): AFPTM, CEA, CA199, CHROMGRNA in the last 8760 hours.  Assessment and Plan:  Hemoptysis secondary to Mycobacterium avium-intracellulare (MAI) and recurrent aspergilloma  Will proceed with Bronchial artery embolization by Dr. Earleen Newport today.  Risks and Benefits discussed with the patient including, but not limited to bleeding, infection, vascular injury, contrast induced renal failure, non-target embolization or spinal cord ischemia.  All of the patient's questions were answered, patient is agreeable to  proceed. Consent signed and in chart.  Thank you for this interesting consult.  I greatly enjoyed meeting [employer] and look forward to participating in their care.  A copy of this report was sent to the requesting provider on this date.  Signed: Murrell Redden PA-C 10/13/2015, 11:27 AM   I spent a total of 40 Minutes in face to face in clinical consultation, greater than 50% of which was counseling/coordinating care for bronchial artery embolization.

## 2015-10-13 NOTE — Sedation Documentation (Signed)
Patient denies pain and is resting comfortably.  

## 2015-10-13 NOTE — ED Provider Notes (Addendum)
TIME SEEN: 4:00 AM  CHIEF COMPLAINT: Hemoptysis, shortness of breath, hypoxia  HPI: Pt is a 39 y.o. male with MAI on chronic azithromycin and ethanmbutol, recurrent aspergilloma status post VATS in 2012 who is on chronic voriconazole who presents emergency department with cough that started around 11 PM last night. Shortly thereafter patient began coughing up bright red blood. Reports he is not coughing up any clots but thinks he lost approximately 100 mL of blood prior to arrival. No vomiting or diarrhea. Not on anticoagulation. Denies missing any of his antibiotics or antifungals. Pulmonologist is Dr. Lavell Anchors.  ID is Dr. Tommy Medal.  PCP is with [facility].  He denies any known fever. No chest pain. He does not wear oxygen at home.  ROS: See HPI Constitutional: no fever  Eyes: no drainage  ENT: no runny nose   Cardiovascular:  no chest pain  Resp:  SOB  GI: no vomiting GU: no dysuria Integumentary: no rash  Allergy: no hives  Musculoskeletal: no leg swelling  Neurological: no slurred speech ROS otherwise negative  PAST MEDICAL HISTORY/PAST SURGICAL HISTORY:  Past Medical History  Diagnosis Date  . 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[place])   . MAI (mycobacterium avium-intracellulare) ([place])   . Lung disease, bullous ([place])   . Esophageal reflux   . Hypothyroidism     secondary to ablation for Graves disease  . Solar lentigo 08/08/2015    MEDICATIONS:  Prior to Admission medications   Medication Sig Start Date End Date Taking? Authorizing Provider  albuterol (PROVENTIL HFA;VENTOLIN HFA) 108 (90 BASE) MCG/ACT inhaler Inhale 2 puffs into the lungs every 6 (six) hours as needed. 05/24/14   Brand Males, MD  alendronate (FOSAMAX) 70 MG tablet Take 1 tablet (70 mg total) by mouth once a week. Take with a full glass of water on an empty stomach. Patient not taking: Reported on 08/08/2015 07/05/14   Truman Hayward, MD  azithromycin [facility]) 500 MG tablet TAKE ONE TABLET BY MOUTH ONCE DAILY 05/11/15    Truman Hayward, MD  ethambutol (MYAMBUTOL) 400 MG tablet TAKE TWO AND ONE-HALF TABLETS BY MOUTH ONCE DAILY 07/11/15   Truman Hayward, MD  ferrous sulfate (FERRO-BOB) 325 (65 FE) MG tablet Take 325 mg by mouth daily.      Historical Provider, MD  Fluticasone Furoate-Vilanterol (BREO ELLIPTA) 100-25 MCG/INH AEPB Take 1 puff by mouth daily. 10/05/15   Tanda Rockers, MD  levothyroxine (SYNTHROID, LEVOTHROID) 50 MCG tablet Take 1 tablet by mouth daily. 09/27/14   Historical Provider, MD  predniSONE (DELTASONE) 20 MG tablet Take 0.5 tablets (10 mg total) by mouth daily. 10/18/14   Brand Males, MD  tiotropium (SPIRIVA HANDIHALER) 18 MCG inhalation capsule Place 1 capsule (18 mcg total) into inhaler and inhale daily. 09/28/14   Tammy S Parrett, NP  voriconazole (VFEND) 200 MG tablet Take 1 tablet (200 mg total) by mouth 2 (two) times daily. 03/28/15   Truman Hayward, MD  voriconazole (VFEND) 50 MG tablet Take 2 tablets (100 mg total) by mouth 2 (two) times daily. Take with the 222m bid for 3037mbid 08/15/15   CoTruman HaywardMD    ALLERGIES:  Allergies  Allergen Reactions  . Clarithromycin [Clarithromycin]     Adverse reaction w/ voriconazole  . Rifaximin [Rifaximin]     Adverse reaction w/ voriconazole    SOCIAL HISTORY:  Social History  Substance Use Topics  . Smoking status: Former Smoker -- 0.70 packs/day for 19 years  Types: Cigarettes    Quit date: 11/20/2007  . Smokeless tobacco: Former Systems developer    Types: Snuff    Quit date: 11/19/1994  . Alcohol Use: No    FAMILY HISTORY: Family History  Problem Relation Age of Onset  . Adopted: Yes    EXAM: BP 130/96 mmHg  Pulse 114  Temp(Src) 99.3 F (37.4 C) (Oral)  Resp 20  SpO2 94% CONSTITUTIONAL: Alert and oriented and responds appropriately to questions. Chronically ill-appearing, in no respiratory distress HEAD: Normocephalic EYES: Conjunctivae clear, PERRL ENT: normal nose; no rhinorrhea; moist mucous  membranes; pharynx without lesions noted NECK: Supple, no meningismus, no LAD  CARD: Regular and tachycardic; S1 and S2 appreciated; no murmurs, no clicks, no rubs, no gallops RESP: Normal chest excursion without splinting, patient is tachypneic and hypoxic, speaking full sentences, coughing up bright red blood without clots, diffuse crackles, no wheezing or rhonchi ABD/GI: Normal bowel sounds; non-distended; soft, non-tender, no rebound, no guarding, no peritoneal signs BACK:  The back appears normal and is non-tender to palpation, there is no CVA tenderness EXT: Normal ROM in all joints; non-tender to palpation; no edema; normal capillary refill; no cyanosis, no calf tenderness or swelling    SKIN: Normal color for age and race; warm NEURO: Moves all extremities equally, sensation to light touch intact diffusely, cranial nerves II through XII intact PSYCH: The patient's mood and manner are appropriate. Grooming and personal hygiene are appropriate.  MEDICAL DECISION MAKING: Patient here with moderate hemoptysis. Has history of MAI and aspergilloma. No history of PE or DVT. Will obtain labs, cultures, chest x-ray and CT of patient's chest. We'll give IV fluids and keep him on the cardiac monitor. He is doing well on 3 L nasal cannula. He will need admission.  ED PROGRESS: Patient's labs unremarkable. Lactate normal. No leukocytosis. Hemoglobin 15.4. Cultures pending. Chest x-ray shows apparent enlarging 3.9 cm mass near the left lung apex concerning for malignancy. Also somewhat worsening bilateral pulmonary nodularity noted. No new pneumothorax seen on chest x-ray. Awaiting CT scan.    6:00 AM  CT scan shows no pulmonary embolus. He does have a new dense layering airspace opacity at the large left apical bulla extending inferiorly along the posterior aspect of the left upper lobe suspicious for acute fungal infection. There are also numerous scattered nodules noted bilaterally compatible with his  history of mycobacterial infection. Loculated left-sided pneumothorax has decreased in size. He now has a new small right basilar pneumothorax. Patient has coughed up approximately 150 mL of bright red blood in the emergency department. Discussed with Dr. Vaughan Browner with critical care. He recommends admitting patient to the ICU at [facility]. [facility] keep him nothing by mouth as he will likely need a bronchoscopy today prior to IR evaluation. Patient is currently stable. Critical care physician recommends giving patient IV Voriconazole which we do not have at this [facility].  CCM says okay to wait until arrival at [facility] to give IV antifungals.  Does not think pt needs broader coverage - no other infiltrate seen at this time.  Will give IV Azithromycin given h/o MAI and ? worsening nodules.  Updated a shunt in his father. He is awaiting transfer to ICU bed.    CRITICAL CARE Performed by: Nyra Jabs   Total critical care time: 45 minutes  Critical care time was exclusive of separately billable procedures and treating other patients.  Critical care was necessary to treat or prevent imminent or life-threatening deterioration.  Critical care was  time spent personally by me on the following activities: development of treatment plan with patient and/or surrogate as well as nursing, discussions with consultants, evaluation of patient's response to treatment, examination of patient, obtaining history from patient or surrogate, ordering and performing treatments and interventions, ordering and review of laboratory studies, ordering and review of radiographic studies, pulse oximetry and re-evaluation of patient's condition.      [place], DO 10/13/15 5940    EKG Interpretation  Date/Time:  Thursday October 13 2015 08:00:50 EST Ventricular Rate:  93 PR Interval:  146 QRS Duration: 88 QT Interval:  352 QTC Calculation: 437 R Axis:   13 Text Interpretation:  Normal sinus rhythm Septal infarct ,  age undetermined Abnormal ECG Compared to prior EKG rate has improved Confirmed by Destiny Hagin,  DO, [place] [phone]) on 10/13/2015 8:07:44 AM        [place], DO 10/13/15 0807   8:35 AM  Pt has a temperature of 101.8. Will give dose of Tylenol prior to transfer to [facility]. [facility] at bedside. Initially had discussed care with critical care physician and discussed with her not we should broaden his antibiotic coverage. There was no other infiltrates on exam. I have not given him any antibiotics other than IV azithromycin. He will need a type and screen and IV voricanazole upon arrival to [place], DO 10/13/15 [phone]

## 2015-10-13 NOTE — Procedures (Signed)
Interventional Radiology Procedure Note  Procedure:  Aortic angiogram, selective angio of mx thoracic segmental vessels, including identification of bilateral bronchial arteries contributing to abnormal opacification of lung disease.  No active extravasation identified.  Empiric embolization of bilateral bronchial arteries to stasis with 500-700 embospheres.  Deployment of right CFA Exoseal   Complications: none Recommendations:  - agree with continued ICU care.  - serial H&H.  - right leg straight for 4 hours.    Signed,  Dulcy Fanny. Earleen Newport, DO

## 2015-10-14 ENCOUNTER — Inpatient Hospital Stay (HOSPITAL_COMMUNITY): Payer: BLUE CROSS/BLUE SHIELD

## 2015-10-14 LAB — CBC
HEMATOCRIT: 42.9 % (ref 39.0–52.0)
HEMOGLOBIN: 14.6 g/dL (ref 13.0–17.0)
MCH: 30.7 pg (ref 26.0–34.0)
MCHC: 34 g/dL (ref 30.0–36.0)
MCV: 90.3 fL (ref 78.0–100.0)
Platelets: 203 10*3/uL (ref 150–400)
RBC: 4.75 MIL/uL (ref 4.22–5.81)
RDW: 13.3 % (ref 11.5–15.5)
WBC: 19 10*3/uL — AB (ref 4.0–10.5)

## 2015-10-14 LAB — BASIC METABOLIC PANEL
ANION GAP: 7 (ref 5–15)
BUN: 10 mg/dL (ref 6–20)
CHLORIDE: 101 mmol/L (ref 101–111)
CO2: 25 mmol/L (ref 22–32)
Calcium: 8 mg/dL — ABNORMAL LOW (ref 8.9–10.3)
Creatinine, Ser: 1 mg/dL (ref 0.61–1.24)
GFR calc Af Amer: >60 mL/min (ref >60)
GLUCOSE: 97 mg/dL (ref 65–99)
POTASSIUM: 3.6 mmol/L (ref 3.5–5.1)
SODIUM: 133 mmol/L — AB (ref 135–145)

## 2015-10-14 MED ORDER — ADULT MULTIVITAMIN W/MINERALS CH
1.0000 | ORAL_TABLET | Freq: Every day | ORAL | Status: DC
  Administered 2015-10-14 – 2015-10-20 (×7): 1 via ORAL
  Filled 2015-10-14 (×7): qty 1

## 2015-10-14 MED ORDER — AZITHROMYCIN 250 MG PO TABS
500.0000 mg | ORAL_TABLET | Freq: Every day | ORAL | Status: DC
  Administered 2015-10-14 – 2015-10-20 (×7): 500 mg via ORAL
  Filled 2015-10-14 (×5): qty 2
  Filled 2015-10-14: qty 1
  Filled 2015-10-14: qty 2

## 2015-10-14 NOTE — Progress Notes (Signed)
Initial Nutrition Assessment  DOCUMENTATION CODES:   Severe malnutrition in context of chronic illness  INTERVENTION:    Ensure Enlive po BID, each supplement provides 350 kcal and 20 grams of protein  MVI daily  NUTRITION DIAGNOSIS:   Malnutrition related to chronic illness as evidenced by severe depletion of muscle mass, severe depletion of body fat, percent weight loss (13% weight loss within the past 6 months).  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   Patient currently on respiratory isolation for potential TB. Patient reports that he usually weighs ~153 lbs, now down to 133 lbs. Nutrition-Focused physical exam completed. Findings are severe fat depletion, severe muscle depletion, and no edema. Patient with severe PCM. Breakfast today was his first meal since admission, and he ate very little. Ensure supplement on his tray table, untouched. Encouraged patient to drink the Ensure to ensure adequate oral intake to prevent further weight loss.   Diet Order:  Diet regular Room service appropriate?: Yes; Fluid consistency:: Thin  Skin:  Reviewed, no issues  Last BM:  unknown  Height:   Ht Readings from Last 1 Encounters:  10/13/15 5' 9" (1.753 m)    Weight:   Wt Readings from Last 1 Encounters:  10/14/15 133 lb 9.6 oz (60.6 kg)    Ideal Body Weight:  72.7 kg  BMI:  Body mass index is 19.72 kg/(m^2).  Estimated Nutritional Needs:   Kcal:   1900-2100  Protein:  90-105 gm  Fluid:  2 L  EDUCATION NEEDS:   No education needs identified at this time  Molli Barrows, [place], [place], [place] Pager [phone] After Hours Pager [phone]

## 2015-10-14 NOTE — Progress Notes (Signed)
Referring Physician(s): Titus Mould  Chief Complaint:  Hemoptysis = S/P Aortic angiogram, selective angio of mx thoracic segmental vessels, including identification of bilateral bronchial arteries contributing to abnormal opacification of lung disease. Empiric embolization of bilateral bronchial arteries to stasis with 500-700 embospheres and deployment of right CFA Exoseal by Dr. Earleen Newport 10/13/2015  Subjective:  [name] is doing OK today.  He reports he feels he isn't coughing as much today.    He denies any hemoptysis since the procedure yesterday.  Allergies: Clarithromycin and Rifaximin  Medications: Prior to Admission medications   Medication Sig Start Date End Date Taking? Authorizing Provider  albuterol (PROVENTIL HFA;VENTOLIN HFA) 108 (90 BASE) MCG/ACT inhaler Inhale 2 puffs into the lungs every 6 (six) hours as needed. 05/24/14  Yes Brett Males, MD  azithromycin (ZITHROMAX) 500 MG tablet TAKE ONE TABLET BY MOUTH ONCE DAILY 05/11/15  Yes Truman Hayward, MD  ethambutol (MYAMBUTOL) 400 MG tablet TAKE TWO AND ONE-HALF TABLETS BY MOUTH ONCE DAILY 07/11/15  Yes Truman Hayward, MD  ferrous sulfate (FERRO-BOB) 325 (65 FE) MG tablet Take 325 mg by mouth daily.     Yes Historical Provider, MD  Fluticasone Furoate-Vilanterol (BREO ELLIPTA) 100-25 MCG/INH AEPB Take 1 puff by mouth daily. 10/05/15  Yes Tanda Rockers, MD  levothyroxine (SYNTHROID, LEVOTHROID) 50 MCG tablet Take 1 tablet by mouth daily. 09/27/14  Yes Historical Provider, MD  predniSONE (DELTASONE) 20 MG tablet Take 0.5 tablets (10 mg total) by mouth daily. 10/18/14  Yes Brett Males, MD  tiotropium (SPIRIVA HANDIHALER) 18 MCG inhalation capsule Place 1 capsule (18 mcg total) into inhaler and inhale daily. 09/28/14  Yes Tammy S Parrett, NP  voriconazole (VFEND) 200 MG tablet Take 1 tablet (200 mg total) by mouth 2 (two) times daily. 03/28/15  Yes Truman Hayward, MD  voriconazole (VFEND) 50 MG tablet Take 2  tablets (100 mg total) by mouth 2 (two) times daily. Take with the 24m bid for 3030mbid 08/15/15  Yes CoTruman HaywardMD  alendronate (FOSAMAX) 70 MG tablet Take 1 tablet (70 mg total) by mouth once a week. Take with a full glass of water on an empty stomach. Patient not taking: Reported on 08/08/2015 07/05/14   CoTruman HaywardMD     Vital Signs: BP 101/70 mmHg  Pulse 90  Temp(Src) 101.9 F (38.8 C) (Oral)  Resp 16  Ht _0  (1.753 m)  Wt 133 lb 9.6 oz (60.6 kg)  BMI 19.72 kg/m2  SpO2 100%  Physical Exam  Awake and alert Lungs still with breath sounds diminished at apices Groin stick ok, no bleeding or hematoma. Palpable pulses.  Imaging: Ct Angio Chest Pe W/cm &/or Wo Cm  10/13/2015  CLINICAL DATA:  Acute onset of hemoptysis, chest tightness and shortness of breath. Initial encounter. EXAM: CT ANGIOGRAPHY CHEST WITH CONTRAST TECHNIQUE: Multidetector CT imaging of the chest was performed using the standard protocol during bolus administration of intravenous contrast. Multiplanar CT image reconstructions and MIPs were obtained to evaluate the vascular anatomy. CONTRAST:  10038mMNIPAQUE IOHEXOL 350 MG/ML SOLN COMPARISON:  Chest radiograph performed earlier today at 4:25 a.m., and CT of the chest performed 03/05/2011 FINDINGS: There is no evidence of pulmonary embolus. The previously noted loculated left-sided pneumothorax has decreased significantly in size. The large bilateral bullae are again noted. There is new dense layering airspace opacification at the large left apical bulla, extending inferiorly along the posterior aspect of the left upper  lobe, suspicious for acute fungal infection given its appearance. 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 This is in a slightly different distribution from the prior study, but appears relatively stable. There is no evidence of pleural effusion. A new small  right basilar pneumothorax is noted. Vague borderline prominent aortopulmonary window and subcarinal nodes are suggested, measuring up to 1.1 cm in short axis. The mediastinum is otherwise unremarkable. No pericardial effusion is identified. The great vessels are grossly unremarkable. No axillary lymphadenopathy is seen. The thyroid gland is unremarkable in appearance. The visualized portions of the liver and spleen are unremarkable. The visualized portions of the pancreas, stomach, adrenal glands and left kidney are within normal limits. No acute osseous abnormalities are seen. Review of the MIP images confirms the above findings. IMPRESSION: 1. No evidence of pulmonary embolus. 2. New dense layering airspace opacification at the patient's large left apical bulla, extending inferiorly along the posterior aspect of the left upper lobe. This is suspicious for acute fungal infection (i.e., a fungal ball), given its appearance. 3. 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 4. Previously noted loculated left-sided pneumothorax has decreased significantly in size. 5. New small right basilar pneumothorax noted. 6. Underlying large bilateral bullae again noted. 7. Suggestion of mildly prominent mediastinal nodes. These results were called by telephone at the time of interpretation on 10/13/2015 at 6:05 am to Dr. Pryor Curia, who verbally acknowledged these results. Electronically Signed   By: Garald Balding M.D.   On: 10/13/2015 06:05   Ir Aorta/thoracic  10/13/2015  CLINICAL DATA:  39 year old male with a history of chronic pulmonary infection with MAC, as well as prior resection for Aspergilloma. He has developed hemoptysis, with significant bleeding on the prior night. Bronchoscopy could not be performed because of concerns for the patient's pulmonary status. Bilateral empiric embolization is planned. EXAM: ULTRASOUND GUIDED ACCESS RIGHT  COMMON FEMORAL ARTERY THORACIC AORTIC ANGIOGRAM SELECTIVE ANGIOGRAM OF MULTIPLE BILATERAL THORACIC SEGMENTAL VESSELS INCLUDING BILATERAL BRONCHIAL ARTERIES. 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 Date:  11/24/201611/24/2016 3:52 pm FLUOROSCOPY TIME:  22 minutes, 24 seconds MEDICATIONS AND MEDICAL HISTORY: 1.0 mg Versed, 50 mcg fentanyl ANESTHESIA/SEDATION: 73 minutes CONTRAST:  242m OMNIPAQUE IOHEXOL 300 MG/ML  SOLN COMPLICATIONS: None PROCEDURE: Informed consent was obtained from the patient following explanation of the procedure, risks, benefits and alternatives. Specific risks include bleeding, infection, arterial injury, need for further surgery or procedure, kidney injury, contrast reaction, non targeted embolization, neurologic injury/ deficit, cardiopulmonary collapse, death The patient understands, agrees and consents for the procedure. All questions were addressed. A time out was performed. Patient is position in the supine position on the fluoroscopy table. The right inguinal region was prepped and draped in the usual sterile fashion. Maximum sterile protection was used including gown and gloves mask. 1% lidocaine was used for local anesthesia. Ultrasound survey of the right inguinal region was performed with images stored and sent to PACs. A micropuncture needle was used access the right common femoral artery under ultrasound. With excellent arterial blood flow returned, and an .018 micro wire was passed through the needle, observed enter the abdominal aorta under fluoroscopy. The needle was removed, and a micropuncture sheath was placed over the wire. The inner dilator and wire were removed, and an 035 Bentson wire was advanced under fluoroscopy into the abdominal aorta.  The sheath was removed and a standard 5 [place] vascular sheath was placed. The dilator was removed and  the sheath was flushed. Bentson wire was advanced to the aortic arch, and a pigtail catheter was passed over the wire to the aortic arch. Thoracic aortic angiogram was performed. Pigtail catheter was exchanged for a Mickelson catheter which was used to select multiple segmental vessels of the thoracic arch. Angiogram was performed of each vessel selected. Once the right bronchial artery was engaged, angiogram was performed. Micro catheter system including a high-flow Renegade catheter was passed into the right bronchial artery, at a safe distance from the origin to avoid refluxing into the aorta. Empiric embolization was then performed with 500 - 700 micro meter embospheres. One vial was used. Micro catheter system was then removed, and a final angiogram was performed through the base catheter. Mickelson catheter was then used to select multiple segmental vessels searching for left bronchial artery. Exchange was made over the Bentson wire for a shunt catheter. The Chung catheter was successful with engaging the origin of left bronchial artery. Angiogram performed. Micro catheter system was then advanced into the left bronchial artery for empiric embolization. Five hundred - 700 micro meter embospheres were used. One vial used. Repeat angiogram was performed. Angiogram of the right common femoral artery was performed. Exoseal was deployed. Patient tolerated the procedure well and remained hemodynamically stable throughout. No complications were encountered and no significant blood loss encountered. FINDINGS: Thoracic aorta demonstrates no dissection flap or aneurysm. No significant atherosclerotic disease along the lumen of the aorta. Multiple segmental vessels identified including 2 right bronchial artery and single left bronchial artery from the aortic angiogram. Superior right bronchial artery angiogram: Tortuous vasculature without stenosis at the origin. Vessel is of large caliber and supplies a cluster of  abnormal vessels at the apex of the lung. Inflammatory tissue at the lung apex a demonstrates dense enhancement with no early draining vein identified. No evidence of arterial contribution towards the spinal canal. Status post microsphere embolization of the right superior bronchial artery there is no significant residual blood flow. Angiogram right supreme intercostal artery angiogram: No contribution to abnormal lung tissue. Angiogram right superior intercostal angiogram: No contribution to the lung apex. Left T9 segmental vessel angiogram: No contribution to the abnormal lung tissue. No reticular medullary vessel identified. Right inferior bronchial artery angiogram: No contribution to the abnormal vasculature at the right apex. Left T8 segmental vessel angiogram: No contribution to abnormal left-sided lung tissue. Left bronchial artery angiogram: Circuitous vasculature with significant contribution to abnormal lung tissue at the left apex. There is dense opacification of inflammatory tissue at the left apex. No extravasation is identified. No contribution of vasculature directed towards the spinal canal. Status post empiric embolization with 500-700 embospheres, no significant contribution of the left bronchial artery to the left apex. IMPRESSION: Status post thoracic aortic and segmental vessel angiogram with empiric embolization of left bronchial artery and superior right bronchial artery, each contributing significant blood flow to abnormal enhancing inflammatory tissue at the left and right apex, respectively. Both artery were embolized to stasis, with no significant opacification of abnormal tissue at the completion. Status post Exoseal deployment. Signed, Dulcy Fanny. Earleen Newport, DO [facility] Electronically Signed   By: Corrie Mckusick D.O.   On: 10/13/2015 19:21   Ir Angiogram Selective Each Additional Vessel  10/13/2015  CLINICAL DATA:  39 year old  male with a history of chronic pulmonary infection with MAC, as well as prior  resection for Aspergilloma. He has developed hemoptysis, with significant bleeding on the prior night. Bronchoscopy could not be performed because of concerns for the patient's pulmonary status. Bilateral empiric embolization is planned. EXAM: ULTRASOUND GUIDED ACCESS RIGHT COMMON FEMORAL ARTERY THORACIC AORTIC ANGIOGRAM SELECTIVE ANGIOGRAM OF MULTIPLE BILATERAL THORACIC SEGMENTAL VESSELS INCLUDING BILATERAL BRONCHIAL ARTERIES. 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 Date:  11/24/201611/24/2016 3:52 pm FLUOROSCOPY TIME:  22 minutes, 24 seconds MEDICATIONS AND MEDICAL HISTORY: 1.0 mg Versed, 50 mcg fentanyl ANESTHESIA/SEDATION: 73 minutes CONTRAST:  236m OMNIPAQUE IOHEXOL 300 MG/ML  SOLN COMPLICATIONS: None PROCEDURE: Informed consent was obtained from the patient following explanation of the procedure, risks, benefits and alternatives. Specific risks include bleeding, infection, arterial injury, need for further surgery or procedure, kidney injury, contrast reaction, non targeted embolization, neurologic injury/ deficit, cardiopulmonary collapse, death The patient understands, agrees and consents for the procedure. All questions were addressed. A time out was performed. Patient is position in the supine position on the fluoroscopy table. The right inguinal region was prepped and draped in the usual sterile fashion. Maximum sterile protection was used including gown and gloves mask. 1% lidocaine was used for local anesthesia. Ultrasound survey of the right inguinal region was performed with images stored and sent to PACs. A micropuncture needle was used access the right common femoral artery under ultrasound. With excellent arterial blood flow returned, and an .018 micro wire was passed through the needle,  observed enter the abdominal aorta under fluoroscopy. The needle was removed, and a micropuncture sheath was placed over the wire. The inner dilator and wire were removed, and an 035 Bentson wire was advanced under fluoroscopy into the abdominal aorta. The sheath was removed and a standard 5 [place] sheath was placed. The dilator was removed and the sheath was flushed. Bentson wire was advanced to the aortic arch, and a pigtail catheter was passed over the wire to the aortic arch. Thoracic aortic angiogram was performed. Pigtail catheter was exchanged for a Mickelson catheter which was used to select multiple segmental vessels of the thoracic arch. Angiogram was performed of each vessel selected. Once the right bronchial artery was engaged, angiogram was performed. Micro catheter system including a high-flow Renegade catheter was passed into the right bronchial artery, at a safe distance from the origin to avoid refluxing into the aorta. Empiric embolization was then performed with 500 - 700 micro meter embospheres. One vial was used. Micro catheter system was then removed, and a final angiogram was performed through the base catheter. Mickelson catheter was then used to select multiple segmental vessels searching for left bronchial artery. Exchange was made over the Bentson wire for a shunt catheter. The Chung catheter was successful with engaging the origin of left bronchial artery. Angiogram performed. Micro catheter system was then advanced into the left bronchial artery for empiric embolization. Five hundred - 700 micro meter embospheres were used. One vial used. Repeat angiogram was performed. Angiogram of the right common femoral artery was performed. Exoseal was deployed. Patient tolerated the procedure well and remained hemodynamically stable throughout. No complications were encountered and no significant blood loss encountered. FINDINGS: Thoracic aorta demonstrates no dissection flap or aneurysm.  No significant atherosclerotic disease along the lumen of the aorta. Multiple segmental vessels identified including 2 right bronchial artery and single left bronchial artery from the aortic angiogram. Superior right bronchial artery angiogram: Tortuous vasculature without stenosis at the origin.  Vessel is of large caliber and supplies a cluster of abnormal vessels at the apex of the lung. Inflammatory tissue at the lung apex a demonstrates dense enhancement with no early draining vein identified. No evidence of arterial contribution towards the spinal canal. Status post microsphere embolization of the right superior bronchial artery there is no significant residual blood flow. Angiogram right supreme intercostal artery angiogram: No contribution to abnormal lung tissue. Angiogram right superior intercostal angiogram: No contribution to the lung apex. Left T9 segmental vessel angiogram: No contribution to the abnormal lung tissue. No reticular medullary vessel identified. Right inferior bronchial artery angiogram: No contribution to the abnormal vasculature at the right apex. Left T8 segmental vessel angiogram: No contribution to abnormal left-sided lung tissue. Left bronchial artery angiogram: Circuitous vasculature with significant contribution to abnormal lung tissue at the left apex. There is dense opacification of inflammatory tissue at the left apex. No extravasation is identified. No contribution of vasculature directed towards the spinal canal. Status post empiric embolization with 500-700 embospheres, no significant contribution of the left bronchial artery to the left apex. IMPRESSION: Status post thoracic aortic and segmental vessel angiogram with empiric embolization of left bronchial artery and superior right bronchial artery, each contributing significant blood flow to abnormal enhancing inflammatory tissue at the left and right apex, respectively. Both artery were embolized to stasis, with no  significant opacification of abnormal tissue at the completion. Status post Exoseal deployment. Signed, Dulcy Fanny. Earleen Newport, DO [facility] Radiology Electronically Signed   By: Corrie Mckusick D.O.   On: 10/13/2015 19:21   Ir Angiogram Selective Each Additional Vessel  10/13/2015  CLINICAL DATA:  39 year old male with a history of chronic pulmonary infection with MAC, as well as prior resection for Aspergilloma. He has developed hemoptysis, with significant bleeding on the prior night. Bronchoscopy could not be performed because of concerns for the patient's pulmonary status. Bilateral empiric embolization is planned. EXAM: ULTRASOUND GUIDED ACCESS RIGHT COMMON FEMORAL ARTERY THORACIC AORTIC ANGIOGRAM SELECTIVE ANGIOGRAM OF MULTIPLE BILATERAL THORACIC SEGMENTAL VESSELS INCLUDING BILATERAL BRONCHIAL ARTERIES. 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 Date:  11/24/201611/24/2016 3:52 pm FLUOROSCOPY TIME:  22 minutes, 24 seconds MEDICATIONS AND MEDICAL HISTORY: 1.0 mg Versed, 50 mcg fentanyl ANESTHESIA/SEDATION: 73 minutes CONTRAST:  275m OMNIPAQUE IOHEXOL 300 MG/ML  SOLN COMPLICATIONS: None PROCEDURE: Informed consent was obtained from the patient following explanation of the procedure, risks, benefits and alternatives. Specific risks include bleeding, infection, arterial injury, need for further surgery or procedure, kidney injury, contrast reaction, non targeted embolization, neurologic injury/ deficit, cardiopulmonary collapse, death The patient understands, agrees and consents for the procedure. All questions were addressed. A time out was performed. Patient is position in the supine position on the fluoroscopy table. The right inguinal region was prepped and draped in the usual sterile fashion. Maximum sterile protection was used  including gown and gloves mask. 1% lidocaine was used for local anesthesia. Ultrasound survey of the right inguinal region was performed with images stored and sent to PACs. A micropuncture needle was used access the right common femoral artery under ultrasound. With excellent arterial blood flow returned, and an .018 micro wire was passed through the needle, observed enter the abdominal aorta under fluoroscopy. The needle was removed, and a micropuncture sheath was placed over the wire. The inner dilator and wire were removed, and an 035 Bentson wire was advanced under fluoroscopy into  the abdominal aorta. The sheath was removed and a standard 5 [place] vascular sheath was placed. The dilator was removed and the sheath was flushed. Bentson wire was advanced to the aortic arch, and a pigtail catheter was passed over the wire to the aortic arch. Thoracic aortic angiogram was performed. Pigtail catheter was exchanged for a Mickelson catheter which was used to select multiple segmental vessels of the thoracic arch. Angiogram was performed of each vessel selected. Once the right bronchial artery was engaged, angiogram was performed. Micro catheter system including a high-flow Renegade catheter was passed into the right bronchial artery, at a safe distance from the origin to avoid refluxing into the aorta. Empiric embolization was then performed with 500 - 700 micro meter embospheres. One vial was used. Micro catheter system was then removed, and a final angiogram was performed through the base catheter. Mickelson catheter was then used to select multiple segmental vessels searching for left bronchial artery. Exchange was made over the Bentson wire for a shunt catheter. The Chung catheter was successful with engaging the origin of left bronchial artery. Angiogram performed. Micro catheter system was then advanced into the left bronchial artery for empiric embolization. Five hundred - 700 micro meter embospheres were used.  One vial used. Repeat angiogram was performed. Angiogram of the right common femoral artery was performed. Exoseal was deployed. Patient tolerated the procedure well and remained hemodynamically stable throughout. No complications were encountered and no significant blood loss encountered. FINDINGS: Thoracic aorta demonstrates no dissection flap or aneurysm. No significant atherosclerotic disease along the lumen of the aorta. Multiple segmental vessels identified including 2 right bronchial artery and single left bronchial artery from the aortic angiogram. Superior right bronchial artery angiogram: Tortuous vasculature without stenosis at the origin. Vessel is of large caliber and supplies a cluster of abnormal vessels at the apex of the lung. Inflammatory tissue at the lung apex a demonstrates dense enhancement with no early draining vein identified. No evidence of arterial contribution towards the spinal canal. Status post microsphere embolization of the right superior bronchial artery there is no significant residual blood flow. Angiogram right supreme intercostal artery angiogram: No contribution to abnormal lung tissue. Angiogram right superior intercostal angiogram: No contribution to the lung apex. Left T9 segmental vessel angiogram: No contribution to the abnormal lung tissue. No reticular medullary vessel identified. Right inferior bronchial artery angiogram: No contribution to the abnormal vasculature at the right apex. Left T8 segmental vessel angiogram: No contribution to abnormal left-sided lung tissue. Left bronchial artery angiogram: Circuitous vasculature with significant contribution to abnormal lung tissue at the left apex. There is dense opacification of inflammatory tissue at the left apex. No extravasation is identified. No contribution of vasculature directed towards the spinal canal. Status post empiric embolization with 500-700 embospheres, no significant contribution of the left bronchial  artery to the left apex. IMPRESSION: Status post thoracic aortic and segmental vessel angiogram with empiric embolization of left bronchial artery and superior right bronchial artery, each contributing significant blood flow to abnormal enhancing inflammatory tissue at the left and right apex, respectively. Both artery were embolized to stasis, with no significant opacification of abnormal tissue at the completion. Status post Exoseal deployment. Signed, Dulcy Fanny. Earleen Newport, DO [facility] Radiology Electronically Signed   By: Corrie Mckusick D.O.   On: 10/13/2015 19:21   Ir Angiogram Selective Each Additional Vessel  10/13/2015  CLINICAL DATA:  39 year old male with a history of chronic pulmonary infection with MAC, as well  as prior resection for Aspergilloma. He has developed hemoptysis, with significant bleeding on the prior night. Bronchoscopy could not be performed because of concerns for the patient's pulmonary status. Bilateral empiric embolization is planned. EXAM: ULTRASOUND GUIDED ACCESS RIGHT COMMON FEMORAL ARTERY THORACIC AORTIC ANGIOGRAM SELECTIVE ANGIOGRAM OF MULTIPLE BILATERAL THORACIC SEGMENTAL VESSELS INCLUDING BILATERAL BRONCHIAL ARTERIES. 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 Date:  11/24/201611/24/2016 3:52 pm FLUOROSCOPY TIME:  22 minutes, 24 seconds MEDICATIONS AND MEDICAL HISTORY: 1.0 mg Versed, 50 mcg fentanyl ANESTHESIA/SEDATION: 73 minutes CONTRAST:  238m OMNIPAQUE IOHEXOL 300 MG/ML  SOLN COMPLICATIONS: None PROCEDURE: Informed consent was obtained from the patient following explanation of the procedure, risks, benefits and alternatives. Specific risks include bleeding, infection, arterial injury, need for further surgery or procedure, kidney injury, contrast reaction, non targeted embolization, neurologic  injury/ deficit, cardiopulmonary collapse, death The patient understands, agrees and consents for the procedure. All questions were addressed. A time out was performed. Patient is position in the supine position on the fluoroscopy table. The right inguinal region was prepped and draped in the usual sterile fashion. Maximum sterile protection was used including gown and gloves mask. 1% lidocaine was used for local anesthesia. Ultrasound survey of the right inguinal region was performed with images stored and sent to PACs. A micropuncture needle was used access the right common femoral artery under ultrasound. With excellent arterial blood flow returned, and an .018 micro wire was passed through the needle, observed enter the abdominal aorta under fluoroscopy. The needle was removed, and a micropuncture sheath was placed over the wire. The inner dilator and wire were removed, and an 035 Bentson wire was advanced under fluoroscopy into the abdominal aorta. The sheath was removed and a standard 5 [place] sheath was placed. The dilator was removed and the sheath was flushed. Bentson wire was advanced to the aortic arch, and a pigtail catheter was passed over the wire to the aortic arch. Thoracic aortic angiogram was performed. Pigtail catheter was exchanged for a Mickelson catheter which was used to select multiple segmental vessels of the thoracic arch. Angiogram was performed of each vessel selected. Once the right bronchial artery was engaged, angiogram was performed. Micro catheter system including a high-flow Renegade catheter was passed into the right bronchial artery, at a safe distance from the origin to avoid refluxing into the aorta. Empiric embolization was then performed with 500 - 700 micro meter embospheres. One vial was used. Micro catheter system was then removed, and a final angiogram was performed through the base catheter. Mickelson catheter was then used to select multiple segmental vessels  searching for left bronchial artery. Exchange was made over the Bentson wire for a shunt catheter. The Chung catheter was successful with engaging the origin of left bronchial artery. Angiogram performed. Micro catheter system was then advanced into the left bronchial artery for empiric embolization. Five hundred - 700 micro meter embospheres were used. One vial used. Repeat angiogram was performed. Angiogram of the right common femoral artery was performed. Exoseal was deployed. Patient tolerated the procedure well and remained hemodynamically stable throughout. No complications were encountered and no significant blood loss encountered. FINDINGS: Thoracic aorta demonstrates no dissection flap or aneurysm. No significant atherosclerotic disease along the lumen of the aorta. Multiple segmental vessels identified including 2 right bronchial artery and single left bronchial artery from the aortic angiogram. Superior right bronchial artery angiogram: Tortuous vasculature without stenosis at  the origin. Vessel is of large caliber and supplies a cluster of abnormal vessels at the apex of the lung. Inflammatory tissue at the lung apex a demonstrates dense enhancement with no early draining vein identified. No evidence of arterial contribution towards the spinal canal. Status post microsphere embolization of the right superior bronchial artery there is no significant residual blood flow. Angiogram right supreme intercostal artery angiogram: No contribution to abnormal lung tissue. Angiogram right superior intercostal angiogram: No contribution to the lung apex. Left T9 segmental vessel angiogram: No contribution to the abnormal lung tissue. No reticular medullary vessel identified. Right inferior bronchial artery angiogram: No contribution to the abnormal vasculature at the right apex. Left T8 segmental vessel angiogram: No contribution to abnormal left-sided lung tissue. Left bronchial artery angiogram: Circuitous  vasculature with significant contribution to abnormal lung tissue at the left apex. There is dense opacification of inflammatory tissue at the left apex. No extravasation is identified. No contribution of vasculature directed towards the spinal canal. Status post empiric embolization with 500-700 embospheres, no significant contribution of the left bronchial artery to the left apex. IMPRESSION: Status post thoracic aortic and segmental vessel angiogram with empiric embolization of left bronchial artery and superior right bronchial artery, each contributing significant blood flow to abnormal enhancing inflammatory tissue at the left and right apex, respectively. Both artery were embolized to stasis, with no significant opacification of abnormal tissue at the completion. Status post Exoseal deployment. Signed, Dulcy Fanny. Earleen Newport, DO [facility] Radiology Electronically Signed   By: Corrie Mckusick D.O.   On: 10/13/2015 19:21   Ir Angiogram Selective Each Additional Vessel  10/13/2015  CLINICAL DATA:  39 year old male with a history of chronic pulmonary infection with MAC, as well as prior resection for Aspergilloma. He has developed hemoptysis, with significant bleeding on the prior night. Bronchoscopy could not be performed because of concerns for the patient's pulmonary status. Bilateral empiric embolization is planned. EXAM: ULTRASOUND GUIDED ACCESS RIGHT COMMON FEMORAL ARTERY THORACIC AORTIC ANGIOGRAM SELECTIVE ANGIOGRAM OF MULTIPLE BILATERAL THORACIC SEGMENTAL VESSELS INCLUDING BILATERAL BRONCHIAL ARTERIES. 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 Date:  11/24/201611/24/2016 3:52 pm FLUOROSCOPY TIME:  22 minutes, 24 seconds MEDICATIONS AND MEDICAL HISTORY: 1.0 mg Versed, 50 mcg fentanyl ANESTHESIA/SEDATION: 73 minutes CONTRAST:  252m  OMNIPAQUE IOHEXOL 300 MG/ML  SOLN COMPLICATIONS: None PROCEDURE: Informed consent was obtained from the patient following explanation of the procedure, risks, benefits and alternatives. Specific risks include bleeding, infection, arterial injury, need for further surgery or procedure, kidney injury, contrast reaction, non targeted embolization, neurologic injury/ deficit, cardiopulmonary collapse, death The patient understands, agrees and consents for the procedure. All questions were addressed. A time out was performed. Patient is position in the supine position on the fluoroscopy table. The right inguinal region was prepped and draped in the usual sterile fashion. Maximum sterile protection was used including gown and gloves mask. 1% lidocaine was used for local anesthesia. Ultrasound survey of the right inguinal region was performed with images stored and sent to PACs. A micropuncture needle was used access the right common femoral artery under ultrasound. With excellent arterial blood flow returned, and an .018 micro wire was passed through the needle, observed enter the abdominal aorta under fluoroscopy. The needle was removed, and a micropuncture sheath was placed over the wire. The inner dilator and wire were removed, and an 035 Bentson wire was advanced under  fluoroscopy into the abdominal aorta. The sheath was removed and a standard 5 [place] vascular sheath was placed. The dilator was removed and the sheath was flushed. Bentson wire was advanced to the aortic arch, and a pigtail catheter was passed over the wire to the aortic arch. Thoracic aortic angiogram was performed. Pigtail catheter was exchanged for a Mickelson catheter which was used to select multiple segmental vessels of the thoracic arch. Angiogram was performed of each vessel selected. Once the right bronchial artery was engaged, angiogram was performed. Micro catheter system including a high-flow Renegade catheter was passed into the right  bronchial artery, at a safe distance from the origin to avoid refluxing into the aorta. Empiric embolization was then performed with 500 - 700 micro meter embospheres. One vial was used. Micro catheter system was then removed, and a final angiogram was performed through the base catheter. Mickelson catheter was then used to select multiple segmental vessels searching for left bronchial artery. Exchange was made over the Bentson wire for a shunt catheter. The Chung catheter was successful with engaging the origin of left bronchial artery. Angiogram performed. Micro catheter system was then advanced into the left bronchial artery for empiric embolization. Five hundred - 700 micro meter embospheres were used. One vial used. Repeat angiogram was performed. Angiogram of the right common femoral artery was performed. Exoseal was deployed. Patient tolerated the procedure well and remained hemodynamically stable throughout. No complications were encountered and no significant blood loss encountered. FINDINGS: Thoracic aorta demonstrates no dissection flap or aneurysm. No significant atherosclerotic disease along the lumen of the aorta. Multiple segmental vessels identified including 2 right bronchial artery and single left bronchial artery from the aortic angiogram. Superior right bronchial artery angiogram: Tortuous vasculature without stenosis at the origin. Vessel is of large caliber and supplies a cluster of abnormal vessels at the apex of the lung. Inflammatory tissue at the lung apex a demonstrates dense enhancement with no early draining vein identified. No evidence of arterial contribution towards the spinal canal. Status post microsphere embolization of the right superior bronchial artery there is no significant residual blood flow. Angiogram right supreme intercostal artery angiogram: No contribution to abnormal lung tissue. Angiogram right superior intercostal angiogram: No contribution to the lung apex. Left T9  segmental vessel angiogram: No contribution to the abnormal lung tissue. No reticular medullary vessel identified. Right inferior bronchial artery angiogram: No contribution to the abnormal vasculature at the right apex. Left T8 segmental vessel angiogram: No contribution to abnormal left-sided lung tissue. Left bronchial artery angiogram: Circuitous vasculature with significant contribution to abnormal lung tissue at the left apex. There is dense opacification of inflammatory tissue at the left apex. No extravasation is identified. No contribution of vasculature directed towards the spinal canal. Status post empiric embolization with 500-700 embospheres, no significant contribution of the left bronchial artery to the left apex. IMPRESSION: Status post thoracic aortic and segmental vessel angiogram with empiric embolization of left bronchial artery and superior right bronchial artery, each contributing significant blood flow to abnormal enhancing inflammatory tissue at the left and right apex, respectively. Both artery were embolized to stasis, with no significant opacification of abnormal tissue at the completion. Status post Exoseal deployment. Signed, Dulcy Fanny. Earleen Newport, DO [facility] Radiology Electronically Signed   By: Corrie Mckusick D.O.   On: 10/13/2015 19:21   Ir Angiogram Selective Each Additional Vessel  10/13/2015  CLINICAL DATA:  39 year old male with a history of chronic pulmonary infection with MAC,  as well as prior resection for Aspergilloma. He has developed hemoptysis, with significant bleeding on the prior night. Bronchoscopy could not be performed because of concerns for the patient's pulmonary status. Bilateral empiric embolization is planned. EXAM: ULTRASOUND GUIDED ACCESS RIGHT COMMON FEMORAL ARTERY THORACIC AORTIC ANGIOGRAM SELECTIVE ANGIOGRAM OF MULTIPLE BILATERAL THORACIC SEGMENTAL VESSELS INCLUDING BILATERAL BRONCHIAL ARTERIES. 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 Date:  11/24/201611/24/2016 3:52 pm FLUOROSCOPY TIME:  22 minutes, 24 seconds MEDICATIONS AND MEDICAL HISTORY: 1.0 mg Versed, 50 mcg fentanyl ANESTHESIA/SEDATION: 73 minutes CONTRAST:  251m OMNIPAQUE IOHEXOL 300 MG/ML  SOLN COMPLICATIONS: None PROCEDURE: Informed consent was obtained from the patient following explanation of the procedure, risks, benefits and alternatives. Specific risks include bleeding, infection, arterial injury, need for further surgery or procedure, kidney injury, contrast reaction, non targeted embolization, neurologic injury/ deficit, cardiopulmonary collapse, death The patient understands, agrees and consents for the procedure. All questions were addressed. A time out was performed. Patient is position in the supine position on the fluoroscopy table. The right inguinal region was prepped and draped in the usual sterile fashion. Maximum sterile protection was used including gown and gloves mask. 1% lidocaine was used for local anesthesia. Ultrasound survey of the right inguinal region was performed with images stored and sent to PACs. A micropuncture needle was used access the right common femoral artery under ultrasound. With excellent arterial blood flow returned, and an .018 micro wire was passed through the needle, observed enter the abdominal aorta under fluoroscopy. The needle was removed, and a micropuncture sheath was placed over the wire. The inner dilator and wire were removed, and an 035 Bentson wire was advanced under fluoroscopy into the abdominal aorta. The sheath was removed and a standard 5 [place] sheath was placed. The dilator was removed and the sheath was flushed. Bentson wire was advanced to the aortic arch, and a pigtail catheter was passed over the wire to the aortic arch. Thoracic aortic angiogram  was performed. Pigtail catheter was exchanged for a Mickelson catheter which was used to select multiple segmental vessels of the thoracic arch. Angiogram was performed of each vessel selected. Once the right bronchial artery was engaged, angiogram was performed. Micro catheter system including a high-flow Renegade catheter was passed into the right bronchial artery, at a safe distance from the origin to avoid refluxing into the aorta. Empiric embolization was then performed with 500 - 700 micro meter embospheres. One vial was used. Micro catheter system was then removed, and a final angiogram was performed through the base catheter. Mickelson catheter was then used to select multiple segmental vessels searching for left bronchial artery. Exchange was made over the Bentson wire for a shunt catheter. The Chung catheter was successful with engaging the origin of left bronchial artery. Angiogram performed. Micro catheter system was then advanced into the left bronchial artery for empiric embolization. Five hundred - 700 micro meter embospheres were used. One vial used. Repeat angiogram was performed. Angiogram of the right common femoral artery was performed. Exoseal was deployed. Patient tolerated the procedure well and remained hemodynamically stable throughout. No complications were encountered and no significant blood loss encountered. FINDINGS: Thoracic aorta demonstrates no dissection flap or aneurysm. No significant atherosclerotic disease along the lumen of the aorta. Multiple segmental vessels identified including 2 right bronchial artery and single left bronchial artery from the aortic angiogram. Superior right bronchial artery angiogram: Tortuous vasculature without  stenosis at the origin. Vessel is of large caliber and supplies a cluster of abnormal vessels at the apex of the lung. Inflammatory tissue at the lung apex a demonstrates dense enhancement with no early draining vein identified. No evidence of  arterial contribution towards the spinal canal. Status post microsphere embolization of the right superior bronchial artery there is no significant residual blood flow. Angiogram right supreme intercostal artery angiogram: No contribution to abnormal lung tissue. Angiogram right superior intercostal angiogram: No contribution to the lung apex. Left T9 segmental vessel angiogram: No contribution to the abnormal lung tissue. No reticular medullary vessel identified. Right inferior bronchial artery angiogram: No contribution to the abnormal vasculature at the right apex. Left T8 segmental vessel angiogram: No contribution to abnormal left-sided lung tissue. Left bronchial artery angiogram: Circuitous vasculature with significant contribution to abnormal lung tissue at the left apex. There is dense opacification of inflammatory tissue at the left apex. No extravasation is identified. No contribution of vasculature directed towards the spinal canal. Status post empiric embolization with 500-700 embospheres, no significant contribution of the left bronchial artery to the left apex. IMPRESSION: Status post thoracic aortic and segmental vessel angiogram with empiric embolization of left bronchial artery and superior right bronchial artery, each contributing significant blood flow to abnormal enhancing inflammatory tissue at the left and right apex, respectively. Both artery were embolized to stasis, with no significant opacification of abnormal tissue at the completion. Status post Exoseal deployment. Signed, Dulcy Fanny. Earleen Newport, DO [facility] Radiology Electronically Signed   By: Corrie Mckusick D.O.   On: 10/13/2015 19:21   Ir Angiogram Selective Each Additional Vessel  10/13/2015  CLINICAL DATA:  39 year old male with a history of chronic pulmonary infection with MAC, as well as prior resection for Aspergilloma. He has developed hemoptysis, with significant bleeding on the  prior night. Bronchoscopy could not be performed because of concerns for the patient's pulmonary status. Bilateral empiric embolization is planned. EXAM: ULTRASOUND GUIDED ACCESS RIGHT COMMON FEMORAL ARTERY THORACIC AORTIC ANGIOGRAM SELECTIVE ANGIOGRAM OF MULTIPLE BILATERAL THORACIC SEGMENTAL VESSELS INCLUDING BILATERAL BRONCHIAL ARTERIES. 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 Date:  11/24/201611/24/2016 3:52 pm FLUOROSCOPY TIME:  22 minutes, 24 seconds MEDICATIONS AND MEDICAL HISTORY: 1.0 mg Versed, 50 mcg fentanyl ANESTHESIA/SEDATION: 73 minutes CONTRAST:  2104m OMNIPAQUE IOHEXOL 300 MG/ML  SOLN COMPLICATIONS: None PROCEDURE: Informed consent was obtained from the patient following explanation of the procedure, risks, benefits and alternatives. Specific risks include bleeding, infection, arterial injury, need for further surgery or procedure, kidney injury, contrast reaction, non targeted embolization, neurologic injury/ deficit, cardiopulmonary collapse, death The patient understands, agrees and consents for the procedure. All questions were addressed. A time out was performed. Patient is position in the supine position on the fluoroscopy table. The right inguinal region was prepped and draped in the usual sterile fashion. Maximum sterile protection was used including gown and gloves mask. 1% lidocaine was used for local anesthesia. Ultrasound survey of the right inguinal region was performed with images stored and sent to PACs. A micropuncture needle was used access the right common femoral artery under ultrasound. With excellent arterial blood flow returned, and an .018 micro wire was passed through the needle, observed enter the abdominal aorta under fluoroscopy. The needle was removed, and a micropuncture sheath was placed over the wire. The inner dilator and wire were removed,  and an 035 Bentson wire  was advanced under fluoroscopy into the abdominal aorta. The sheath was removed and a standard 5 [place] vascular sheath was placed. The dilator was removed and the sheath was flushed. Bentson wire was advanced to the aortic arch, and a pigtail catheter was passed over the wire to the aortic arch. Thoracic aortic angiogram was performed. Pigtail catheter was exchanged for a Mickelson catheter which was used to select multiple segmental vessels of the thoracic arch. Angiogram was performed of each vessel selected. Once the right bronchial artery was engaged, angiogram was performed. Micro catheter system including a high-flow Renegade catheter was passed into the right bronchial artery, at a safe distance from the origin to avoid refluxing into the aorta. Empiric embolization was then performed with 500 - 700 micro meter embospheres. One vial was used. Micro catheter system was then removed, and a final angiogram was performed through the base catheter. Mickelson catheter was then used to select multiple segmental vessels searching for left bronchial artery. Exchange was made over the Bentson wire for a shunt catheter. The Chung catheter was successful with engaging the origin of left bronchial artery. Angiogram performed. Micro catheter system was then advanced into the left bronchial artery for empiric embolization. Five hundred - 700 micro meter embospheres were used. One vial used. Repeat angiogram was performed. Angiogram of the right common femoral artery was performed. Exoseal was deployed. Patient tolerated the procedure well and remained hemodynamically stable throughout. No complications were encountered and no significant blood loss encountered. FINDINGS: Thoracic aorta demonstrates no dissection flap or aneurysm. No significant atherosclerotic disease along the lumen of the aorta. Multiple segmental vessels identified including 2 right bronchial artery and single left bronchial  artery from the aortic angiogram. Superior right bronchial artery angiogram: Tortuous vasculature without stenosis at the origin. Vessel is of large caliber and supplies a cluster of abnormal vessels at the apex of the lung. Inflammatory tissue at the lung apex a demonstrates dense enhancement with no early draining vein identified. No evidence of arterial contribution towards the spinal canal. Status post microsphere embolization of the right superior bronchial artery there is no significant residual blood flow. Angiogram right supreme intercostal artery angiogram: No contribution to abnormal lung tissue. Angiogram right superior intercostal angiogram: No contribution to the lung apex. Left T9 segmental vessel angiogram: No contribution to the abnormal lung tissue. No reticular medullary vessel identified. Right inferior bronchial artery angiogram: No contribution to the abnormal vasculature at the right apex. Left T8 segmental vessel angiogram: No contribution to abnormal left-sided lung tissue. Left bronchial artery angiogram: Circuitous vasculature with significant contribution to abnormal lung tissue at the left apex. There is dense opacification of inflammatory tissue at the left apex. No extravasation is identified. No contribution of vasculature directed towards the spinal canal. Status post empiric embolization with 500-700 embospheres, no significant contribution of the left bronchial artery to the left apex. IMPRESSION: Status post thoracic aortic and segmental vessel angiogram with empiric embolization of left bronchial artery and superior right bronchial artery, each contributing significant blood flow to abnormal enhancing inflammatory tissue at the left and right apex, respectively. Both artery were embolized to stasis, with no significant opacification of abnormal tissue at the completion. Status post Exoseal deployment. Signed, Dulcy Fanny. Earleen Newport, DO [facility]  [facility] Radiology Electronically Signed   By: Corrie Mckusick D.O.   On: 10/13/2015 19:21   Ir Angiogram Selective Each Additional Vessel  10/13/2015  CLINICAL DATA:  39 year old male with a history of chronic pulmonary  infection with MAC, as well as prior resection for Aspergilloma. He has developed hemoptysis, with significant bleeding on the prior night. Bronchoscopy could not be performed because of concerns for the patient's pulmonary status. Bilateral empiric embolization is planned. EXAM: ULTRASOUND GUIDED ACCESS RIGHT COMMON FEMORAL ARTERY THORACIC AORTIC ANGIOGRAM SELECTIVE ANGIOGRAM OF MULTIPLE BILATERAL THORACIC SEGMENTAL VESSELS INCLUDING BILATERAL BRONCHIAL ARTERIES. 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 Date:  11/24/201611/24/2016 3:52 pm FLUOROSCOPY TIME:  22 minutes, 24 seconds MEDICATIONS AND MEDICAL HISTORY: 1.0 mg Versed, 50 mcg fentanyl ANESTHESIA/SEDATION: 73 minutes CONTRAST:  222m OMNIPAQUE IOHEXOL 300 MG/ML  SOLN COMPLICATIONS: None PROCEDURE: Informed consent was obtained from the patient following explanation of the procedure, risks, benefits and alternatives. Specific risks include bleeding, infection, arterial injury, need for further surgery or procedure, kidney injury, contrast reaction, non targeted embolization, neurologic injury/ deficit, cardiopulmonary collapse, death The patient understands, agrees and consents for the procedure. All questions were addressed. A time out was performed. Patient is position in the supine position on the fluoroscopy table. The right inguinal region was prepped and draped in the usual sterile fashion. Maximum sterile protection was used including gown and gloves mask. 1% lidocaine was used for local anesthesia. Ultrasound survey of the right inguinal region was performed with images stored and sent to PACs. A  micropuncture needle was used access the right common femoral artery under ultrasound. With excellent arterial blood flow returned, and an .018 micro wire was passed through the needle, observed enter the abdominal aorta under fluoroscopy. The needle was removed, and a micropuncture sheath was placed over the wire. The inner dilator and wire were removed, and an 035 Bentson wire was advanced under fluoroscopy into the abdominal aorta. The sheath was removed and a standard 5 [place] sheath was placed. The dilator was removed and the sheath was flushed. Bentson wire was advanced to the aortic arch, and a pigtail catheter was passed over the wire to the aortic arch. Thoracic aortic angiogram was performed. Pigtail catheter was exchanged for a Mickelson catheter which was used to select multiple segmental vessels of the thoracic arch. Angiogram was performed of each vessel selected. Once the right bronchial artery was engaged, angiogram was performed. Micro catheter system including a high-flow Renegade catheter was passed into the right bronchial artery, at a safe distance from the origin to avoid refluxing into the aorta. Empiric embolization was then performed with 500 - 700 micro meter embospheres. One vial was used. Micro catheter system was then removed, and a final angiogram was performed through the base catheter. Mickelson catheter was then used to select multiple segmental vessels searching for left bronchial artery. Exchange was made over the Bentson wire for a shunt catheter. The Chung catheter was successful with engaging the origin of left bronchial artery. Angiogram performed. Micro catheter system was then advanced into the left bronchial artery for empiric embolization. Five hundred - 700 micro meter embospheres were used. One vial used. Repeat angiogram was performed. Angiogram of the right common femoral artery was performed. Exoseal was deployed. Patient tolerated the procedure well and  remained hemodynamically stable throughout. No complications were encountered and no significant blood loss encountered. FINDINGS: Thoracic aorta demonstrates no dissection flap or aneurysm. No significant atherosclerotic disease along the lumen of the aorta. Multiple segmental vessels identified including 2 right bronchial artery and single left bronchial artery from the aortic angiogram. Superior right bronchial artery angiogram:  Tortuous vasculature without stenosis at the origin. Vessel is of large caliber and supplies a cluster of abnormal vessels at the apex of the lung. Inflammatory tissue at the lung apex a demonstrates dense enhancement with no early draining vein identified. No evidence of arterial contribution towards the spinal canal. Status post microsphere embolization of the right superior bronchial artery there is no significant residual blood flow. Angiogram right supreme intercostal artery angiogram: No contribution to abnormal lung tissue. Angiogram right superior intercostal angiogram: No contribution to the lung apex. Left T9 segmental vessel angiogram: No contribution to the abnormal lung tissue. No reticular medullary vessel identified. Right inferior bronchial artery angiogram: No contribution to the abnormal vasculature at the right apex. Left T8 segmental vessel angiogram: No contribution to abnormal left-sided lung tissue. Left bronchial artery angiogram: Circuitous vasculature with significant contribution to abnormal lung tissue at the left apex. There is dense opacification of inflammatory tissue at the left apex. No extravasation is identified. No contribution of vasculature directed towards the spinal canal. Status post empiric embolization with 500-700 embospheres, no significant contribution of the left bronchial artery to the left apex. IMPRESSION: Status post thoracic aortic and segmental vessel angiogram with empiric embolization of left bronchial artery and superior right  bronchial artery, each contributing significant blood flow to abnormal enhancing inflammatory tissue at the left and right apex, respectively. Both artery were embolized to stasis, with no significant opacification of abnormal tissue at the completion. Status post Exoseal deployment. Signed, Dulcy Fanny. Earleen Newport, DO [facility] Radiology Electronically Signed   By: Corrie Mckusick D.O.   On: 10/13/2015 19:21   Ir Angiogram Follow Up Study  10/13/2015  CLINICAL DATA:  39 year old male with a history of chronic pulmonary infection with MAC, as well as prior resection for Aspergilloma. He has developed hemoptysis, with significant bleeding on the prior night. Bronchoscopy could not be performed because of concerns for the patient's pulmonary status. Bilateral empiric embolization is planned. EXAM: ULTRASOUND GUIDED ACCESS RIGHT COMMON FEMORAL ARTERY THORACIC AORTIC ANGIOGRAM SELECTIVE ANGIOGRAM OF MULTIPLE BILATERAL THORACIC SEGMENTAL VESSELS INCLUDING BILATERAL BRONCHIAL ARTERIES. 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 Date:  11/24/201611/24/2016 3:52 pm FLUOROSCOPY TIME:  22 minutes, 24 seconds MEDICATIONS AND MEDICAL HISTORY: 1.0 mg Versed, 50 mcg fentanyl ANESTHESIA/SEDATION: 73 minutes CONTRAST:  265m OMNIPAQUE IOHEXOL 300 MG/ML  SOLN COMPLICATIONS: None PROCEDURE: Informed consent was obtained from the patient following explanation of the procedure, risks, benefits and alternatives. Specific risks include bleeding, infection, arterial injury, need for further surgery or procedure, kidney injury, contrast reaction, non targeted embolization, neurologic injury/ deficit, cardiopulmonary collapse, death The patient understands, agrees and consents for the procedure. All questions were addressed. A time out was performed. Patient is position  in the supine position on the fluoroscopy table. The right inguinal region was prepped and draped in the usual sterile fashion. Maximum sterile protection was used including gown and gloves mask. 1% lidocaine was used for local anesthesia. Ultrasound survey of the right inguinal region was performed with images stored and sent to PACs. A micropuncture needle was used access the right common femoral artery under ultrasound. With excellent arterial blood flow returned, and an .018 micro wire was passed through the needle, observed enter the abdominal aorta under fluoroscopy. The needle was removed, and a micropuncture sheath was placed over the wire. The inner dilator and wire were removed, and an 035 Bentson  wire was advanced under fluoroscopy into the abdominal aorta. The sheath was removed and a standard 5 [place] vascular sheath was placed. The dilator was removed and the sheath was flushed. Bentson wire was advanced to the aortic arch, and a pigtail catheter was passed over the wire to the aortic arch. Thoracic aortic angiogram was performed. Pigtail catheter was exchanged for a Mickelson catheter which was used to select multiple segmental vessels of the thoracic arch. Angiogram was performed of each vessel selected. Once the right bronchial artery was engaged, angiogram was performed. Micro catheter system including a high-flow Renegade catheter was passed into the right bronchial artery, at a safe distance from the origin to avoid refluxing into the aorta. Empiric embolization was then performed with 500 - 700 micro meter embospheres. One vial was used. Micro catheter system was then removed, and a final angiogram was performed through the base catheter. Mickelson catheter was then used to select multiple segmental vessels searching for left bronchial artery. Exchange was made over the Bentson wire for a shunt catheter. The Chung catheter was successful with engaging the origin of left bronchial artery.  Angiogram performed. Micro catheter system was then advanced into the left bronchial artery for empiric embolization. Five hundred - 700 micro meter embospheres were used. One vial used. Repeat angiogram was performed. Angiogram of the right common femoral artery was performed. Exoseal was deployed. Patient tolerated the procedure well and remained hemodynamically stable throughout. No complications were encountered and no significant blood loss encountered. FINDINGS: Thoracic aorta demonstrates no dissection flap or aneurysm. No significant atherosclerotic disease along the lumen of the aorta. Multiple segmental vessels identified including 2 right bronchial artery and single left bronchial artery from the aortic angiogram. Superior right bronchial artery angiogram: Tortuous vasculature without stenosis at the origin. Vessel is of large caliber and supplies a cluster of abnormal vessels at the apex of the lung. Inflammatory tissue at the lung apex a demonstrates dense enhancement with no early draining vein identified. No evidence of arterial contribution towards the spinal canal. Status post microsphere embolization of the right superior bronchial artery there is no significant residual blood flow. Angiogram right supreme intercostal artery angiogram: No contribution to abnormal lung tissue. Angiogram right superior intercostal angiogram: No contribution to the lung apex. Left T9 segmental vessel angiogram: No contribution to the abnormal lung tissue. No reticular medullary vessel identified. Right inferior bronchial artery angiogram: No contribution to the abnormal vasculature at the right apex. Left T8 segmental vessel angiogram: No contribution to abnormal left-sided lung tissue. Left bronchial artery angiogram: Circuitous vasculature with significant contribution to abnormal lung tissue at the left apex. There is dense opacification of inflammatory tissue at the left apex. No extravasation is identified. No  contribution of vasculature directed towards the spinal canal. Status post empiric embolization with 500-700 embospheres, no significant contribution of the left bronchial artery to the left apex. IMPRESSION: Status post thoracic aortic and segmental vessel angiogram with empiric embolization of left bronchial artery and superior right bronchial artery, each contributing significant blood flow to abnormal enhancing inflammatory tissue at the left and right apex, respectively. Both artery were embolized to stasis, with no significant opacification of abnormal tissue at the completion. Status post Exoseal deployment. Signed, Dulcy Fanny. Earleen Newport, DO [facility] Radiology Electronically Signed   By: Corrie Mckusick D.O.   On: 10/13/2015 19:21   Ir Angiogram Follow Up Study  10/13/2015  CLINICAL DATA:  39 year old male with a history of chronic pulmonary  infection with MAC, as well as prior resection for Aspergilloma. He has developed hemoptysis, with significant bleeding on the prior night. Bronchoscopy could not be performed because of concerns for the patient's pulmonary status. Bilateral empiric embolization is planned. EXAM: ULTRASOUND GUIDED ACCESS RIGHT COMMON FEMORAL ARTERY THORACIC AORTIC ANGIOGRAM SELECTIVE ANGIOGRAM OF MULTIPLE BILATERAL THORACIC SEGMENTAL VESSELS INCLUDING BILATERAL BRONCHIAL ARTERIES. 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 Date:  11/24/201611/24/2016 3:52 pm FLUOROSCOPY TIME:  22 minutes, 24 seconds MEDICATIONS AND MEDICAL HISTORY: 1.0 mg Versed, 50 mcg fentanyl ANESTHESIA/SEDATION: 73 minutes CONTRAST:  253m OMNIPAQUE IOHEXOL 300 MG/ML  SOLN COMPLICATIONS: None PROCEDURE: Informed consent was obtained from the patient following explanation of the procedure, risks, benefits and alternatives. Specific risks include  bleeding, infection, arterial injury, need for further surgery or procedure, kidney injury, contrast reaction, non targeted embolization, neurologic injury/ deficit, cardiopulmonary collapse, death The patient understands, agrees and consents for the procedure. All questions were addressed. A time out was performed. Patient is position in the supine position on the fluoroscopy table. The right inguinal region was prepped and draped in the usual sterile fashion. Maximum sterile protection was used including gown and gloves mask. 1% lidocaine was used for local anesthesia. Ultrasound survey of the right inguinal region was performed with images stored and sent to PACs. A micropuncture needle was used access the right common femoral artery under ultrasound. With excellent arterial blood flow returned, and an .018 micro wire was passed through the needle, observed enter the abdominal aorta under fluoroscopy. The needle was removed, and a micropuncture sheath was placed over the wire. The inner dilator and wire were removed, and an 035 Bentson wire was advanced under fluoroscopy into the abdominal aorta. The sheath was removed and a standard 5 [place] sheath was placed. The dilator was removed and the sheath was flushed. Bentson wire was advanced to the aortic arch, and a pigtail catheter was passed over the wire to the aortic arch. Thoracic aortic angiogram was performed. Pigtail catheter was exchanged for a Mickelson catheter which was used to select multiple segmental vessels of the thoracic arch. Angiogram was performed of each vessel selected. Once the right bronchial artery was engaged, angiogram was performed. Micro catheter system including a high-flow Renegade catheter was passed into the right bronchial artery, at a safe distance from the origin to avoid refluxing into the aorta. Empiric embolization was then performed with 500 - 700 micro meter embospheres. One vial was used. Micro catheter system was  then removed, and a final angiogram was performed through the base catheter. Mickelson catheter was then used to select multiple segmental vessels searching for left bronchial artery. Exchange was made over the Bentson wire for a shunt catheter. The Chung catheter was successful with engaging the origin of left bronchial artery. Angiogram performed. Micro catheter system was then advanced into the left bronchial artery for empiric embolization. Five hundred - 700 micro meter embospheres were used. One vial used. Repeat angiogram was performed. Angiogram of the right common femoral artery was performed. Exoseal was deployed. Patient tolerated the procedure well and remained hemodynamically stable throughout. No complications were encountered and no significant blood loss encountered. FINDINGS: Thoracic aorta demonstrates no dissection flap or aneurysm. No significant atherosclerotic disease along the lumen of the aorta. Multiple segmental vessels identified including 2 right bronchial artery and single left bronchial artery from the aortic angiogram. Superior right bronchial artery angiogram:  Tortuous vasculature without stenosis at the origin. Vessel is of large caliber and supplies a cluster of abnormal vessels at the apex of the lung. Inflammatory tissue at the lung apex a demonstrates dense enhancement with no early draining vein identified. No evidence of arterial contribution towards the spinal canal. Status post microsphere embolization of the right superior bronchial artery there is no significant residual blood flow. Angiogram right supreme intercostal artery angiogram: No contribution to abnormal lung tissue. Angiogram right superior intercostal angiogram: No contribution to the lung apex. Left T9 segmental vessel angiogram: No contribution to the abnormal lung tissue. No reticular medullary vessel identified. Right inferior bronchial artery angiogram: No contribution to the abnormal vasculature at the  right apex. Left T8 segmental vessel angiogram: No contribution to abnormal left-sided lung tissue. Left bronchial artery angiogram: Circuitous vasculature with significant contribution to abnormal lung tissue at the left apex. There is dense opacification of inflammatory tissue at the left apex. No extravasation is identified. No contribution of vasculature directed towards the spinal canal. Status post empiric embolization with 500-700 embospheres, no significant contribution of the left bronchial artery to the left apex. IMPRESSION: Status post thoracic aortic and segmental vessel angiogram with empiric embolization of left bronchial artery and superior right bronchial artery, each contributing significant blood flow to abnormal enhancing inflammatory tissue at the left and right apex, respectively. Both artery were embolized to stasis, with no significant opacification of abnormal tissue at the completion. Status post Exoseal deployment. Signed, Dulcy Fanny. Earleen Newport, DO [facility] Radiology Electronically Signed   By: Corrie Mckusick D.O.   On: 10/13/2015 19:21   Ir US Guide Vasc Access Right  10/13/2015  CLINICAL DATA:  39 year old male with a history of chronic pulmonary infection with MAC, as well as prior resection for Aspergilloma. He has developed hemoptysis, with significant bleeding on the prior night. Bronchoscopy could not be performed because of concerns for the patient's pulmonary status. Bilateral empiric embolization is planned. EXAM: ULTRASOUND GUIDED ACCESS RIGHT COMMON FEMORAL ARTERY THORACIC AORTIC ANGIOGRAM SELECTIVE ANGIOGRAM OF MULTIPLE BILATERAL THORACIC SEGMENTAL VESSELS INCLUDING BILATERAL BRONCHIAL ARTERIES. 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 Date:  11/24/201611/24/2016 3:52 pm FLUOROSCOPY TIME:  22  minutes, 24 seconds MEDICATIONS AND MEDICAL HISTORY: 1.0 mg Versed, 50 mcg fentanyl ANESTHESIA/SEDATION: 73 minutes CONTRAST:  29m OMNIPAQUE IOHEXOL 300 MG/ML  SOLN COMPLICATIONS: None PROCEDURE: Informed consent was obtained from the patient following explanation of the procedure, risks, benefits and alternatives. Specific risks include bleeding, infection, arterial injury, need for further surgery or procedure, kidney injury, contrast reaction, non targeted embolization, neurologic injury/ deficit, cardiopulmonary collapse, death The patient understands, agrees and consents for the procedure. All questions were addressed. A time out was performed. Patient is position in the supine position on the fluoroscopy table. The right inguinal region was prepped and draped in the usual sterile fashion. Maximum sterile protection was used including gown and gloves mask. 1% lidocaine was used for local anesthesia. Ultrasound survey of the right inguinal region was performed with images stored and sent to PACs. A micropuncture needle was used access the right common femoral artery under ultrasound. With excellent arterial blood flow returned, and an .018 micro wire was passed through the needle, observed enter the abdominal aorta under fluoroscopy. The needle was removed, and a micropuncture sheath was placed over the wire. The inner dilator and wire were removed, and an 035  Bentson wire was advanced under fluoroscopy into the abdominal aorta. The sheath was removed and a standard 5 [place] vascular sheath was placed. The dilator was removed and the sheath was flushed. Bentson wire was advanced to the aortic arch, and a pigtail catheter was passed over the wire to the aortic arch. Thoracic aortic angiogram was performed. Pigtail catheter was exchanged for a Mickelson catheter which was used to select multiple segmental vessels of the thoracic arch. Angiogram was performed of each vessel selected. Once the right bronchial  artery was engaged, angiogram was performed. Micro catheter system including a high-flow Renegade catheter was passed into the right bronchial artery, at a safe distance from the origin to avoid refluxing into the aorta. Empiric embolization was then performed with 500 - 700 micro meter embospheres. One vial was used. Micro catheter system was then removed, and a final angiogram was performed through the base catheter. Mickelson catheter was then used to select multiple segmental vessels searching for left bronchial artery. Exchange was made over the Bentson wire for a shunt catheter. The Chung catheter was successful with engaging the origin of left bronchial artery. Angiogram performed. Micro catheter system was then advanced into the left bronchial artery for empiric embolization. Five hundred - 700 micro meter embospheres were used. One vial used. Repeat angiogram was performed. Angiogram of the right common femoral artery was performed. Exoseal was deployed. Patient tolerated the procedure well and remained hemodynamically stable throughout. No complications were encountered and no significant blood loss encountered. FINDINGS: Thoracic aorta demonstrates no dissection flap or aneurysm. No significant atherosclerotic disease along the lumen of the aorta. Multiple segmental vessels identified including 2 right bronchial artery and single left bronchial artery from the aortic angiogram. Superior right bronchial artery angiogram: Tortuous vasculature without stenosis at the origin. Vessel is of large caliber and supplies a cluster of abnormal vessels at the apex of the lung. Inflammatory tissue at the lung apex a demonstrates dense enhancement with no early draining vein identified. No evidence of arterial contribution towards the spinal canal. Status post microsphere embolization of the right superior bronchial artery there is no significant residual blood flow. Angiogram right supreme intercostal artery  angiogram: No contribution to abnormal lung tissue. Angiogram right superior intercostal angiogram: No contribution to the lung apex. Left T9 segmental vessel angiogram: No contribution to the abnormal lung tissue. No reticular medullary vessel identified. Right inferior bronchial artery angiogram: No contribution to the abnormal vasculature at the right apex. Left T8 segmental vessel angiogram: No contribution to abnormal left-sided lung tissue. Left bronchial artery angiogram: Circuitous vasculature with significant contribution to abnormal lung tissue at the left apex. There is dense opacification of inflammatory tissue at the left apex. No extravasation is identified. No contribution of vasculature directed towards the spinal canal. Status post empiric embolization with 500-700 embospheres, no significant contribution of the left bronchial artery to the left apex. IMPRESSION: Status post thoracic aortic and segmental vessel angiogram with empiric embolization of left bronchial artery and superior right bronchial artery, each contributing significant blood flow to abnormal enhancing inflammatory tissue at the left and right apex, respectively. Both artery were embolized to stasis, with no significant opacification of abnormal tissue at the completion. Status post Exoseal deployment. Signed, Dulcy Fanny. Earleen Newport, DO [facility] Radiology Electronically Signed   By: Corrie Mckusick D.O.   On: 10/13/2015 19:21   Dg Chest Port 1 View  10/14/2015  CLINICAL DATA:  Hemoptysis, mycobacterium avium intracellular complex. EXAM: PORTABLE  CHEST 1 VIEW COMPARISON:  October 13, 2015. FINDINGS: Stable cardiomediastinal silhouette. Bilateral upper lobe bulla are again noted and unchanged. Associated scarring in the both upper lobes is noted. Opacity is again noted in the left upper lobe most likely representing fungus ball. No significant pleural effusion is noted. Bony thorax is  unremarkable. IMPRESSION: Stable bilateral bulla formation and scarring is noted in both upper lobes. Soft tissue density is again noted in left upper lobe most likely representing fungus ball as described on prior CT scan. No significant changes noted compared to prior exam. Electronically Signed   By: Marijo Conception, M.D.   On: 10/14/2015 07:41   Dg Chest Portable 1 View  10/13/2015  CLINICAL DATA:  Acute onset of hemoptysis and shortness of breath. Initial encounter. EXAM: PORTABLE CHEST 1 VIEW COMPARISON:  Chest radiograph performed 09/28/2014 FINDINGS: There appears to be an enlarging 3.9 cm mass near the left lung apex. Biapical pleural parenchymal scarring and bullous change are again seen, with somewhat worsening bilateral pulmonary nodularity. Pleural thickening near the left lung mass appears worsened, raising concern for local spread of disease. No pleural effusion or pneumothorax is seen. The cardiomediastinal silhouette is normal in size. No acute osseous abnormalities are identified. A chronic left fourth rib deformity is noted. IMPRESSION: 1. Apparent enlarging 3.9 cm mass near the left lung apex, concerning for malignancy. Pleural thickening near the left lung mass appears worsened, raising concern for local spread of disease. CT of the chest would be helpful for further evaluation, when and as deemed clinically appropriate. 2. Somewhat worsening bilateral pulmonary nodularity noted. Biapical pleural parenchymal scarring and bullous change again noted. Electronically Signed   By: Garald Balding M.D.   On: 10/13/2015 05:22   [place] Guide Roadmapping  10/13/2015  CLINICAL DATA:  39 year old male with a history of chronic pulmonary infection with MAC, as well as prior resection for Aspergilloma. He has developed hemoptysis, with significant bleeding on the prior night. Bronchoscopy could not be performed because of concerns for the patient's pulmonary status.  Bilateral empiric embolization is planned. EXAM: ULTRASOUND GUIDED ACCESS RIGHT COMMON FEMORAL ARTERY THORACIC AORTIC ANGIOGRAM SELECTIVE ANGIOGRAM OF MULTIPLE BILATERAL THORACIC SEGMENTAL VESSELS INCLUDING BILATERAL BRONCHIAL ARTERIES. 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 Date:  11/24/201611/24/2016 3:52 pm FLUOROSCOPY TIME:  22 minutes, 24 seconds MEDICATIONS AND MEDICAL HISTORY: 1.0 mg Versed, 50 mcg fentanyl ANESTHESIA/SEDATION: 73 minutes CONTRAST:  249m OMNIPAQUE IOHEXOL 300 MG/ML  SOLN COMPLICATIONS: None PROCEDURE: Informed consent was obtained from the patient following explanation of the procedure, risks, benefits and alternatives. Specific risks include bleeding, infection, arterial injury, need for further surgery or procedure, kidney injury, contrast reaction, non targeted embolization, neurologic injury/ deficit, cardiopulmonary collapse, death The patient understands, agrees and consents for the procedure. All questions were addressed. A time out was performed. Patient is position in the supine position on the fluoroscopy table. The right inguinal region was prepped and draped in the usual sterile fashion. Maximum sterile protection was used including gown and gloves mask. 1% lidocaine was used for local anesthesia. Ultrasound survey of the right inguinal region was performed with images stored and sent to PACs. A micropuncture needle was used access the right common femoral artery under ultrasound. With excellent arterial blood flow returned, and an .018 micro wire was passed through the needle, observed enter the abdominal aorta  under fluoroscopy. The needle was removed, and a micropuncture sheath was placed over the wire. The inner dilator and wire were removed, and an 035 Bentson wire was advanced under fluoroscopy into the abdominal aorta. The sheath was removed  and a standard 5 [place] vascular sheath was placed. The dilator was removed and the sheath was flushed. Bentson wire was advanced to the aortic arch, and a pigtail catheter was passed over the wire to the aortic arch. Thoracic aortic angiogram was performed. Pigtail catheter was exchanged for a Mickelson catheter which was used to select multiple segmental vessels of the thoracic arch. Angiogram was performed of each vessel selected. Once the right bronchial artery was engaged, angiogram was performed. Micro catheter system including a high-flow Renegade catheter was passed into the right bronchial artery, at a safe distance from the origin to avoid refluxing into the aorta. Empiric embolization was then performed with 500 - 700 micro meter embospheres. One vial was used. Micro catheter system was then removed, and a final angiogram was performed through the base catheter. Mickelson catheter was then used to select multiple segmental vessels searching for left bronchial artery. Exchange was made over the Bentson wire for a shunt catheter. The Chung catheter was successful with engaging the origin of left bronchial artery. Angiogram performed. Micro catheter system was then advanced into the left bronchial artery for empiric embolization. Five hundred - 700 micro meter embospheres were used. One vial used. Repeat angiogram was performed. Angiogram of the right common femoral artery was performed. Exoseal was deployed. Patient tolerated the procedure well and remained hemodynamically stable throughout. No complications were encountered and no significant blood loss encountered. FINDINGS: Thoracic aorta demonstrates no dissection flap or aneurysm. No significant atherosclerotic disease along the lumen of the aorta. Multiple segmental vessels identified including 2 right bronchial artery and single left bronchial artery from the aortic angiogram. Superior right bronchial artery angiogram: Tortuous vasculature without  stenosis at the origin. Vessel is of large caliber and supplies a cluster of abnormal vessels at the apex of the lung. Inflammatory tissue at the lung apex a demonstrates dense enhancement with no early draining vein identified. No evidence of arterial contribution towards the spinal canal. Status post microsphere embolization of the right superior bronchial artery there is no significant residual blood flow. Angiogram right supreme intercostal artery angiogram: No contribution to abnormal lung tissue. Angiogram right superior intercostal angiogram: No contribution to the lung apex. Left T9 segmental vessel angiogram: No contribution to the abnormal lung tissue. No reticular medullary vessel identified. Right inferior bronchial artery angiogram: No contribution to the abnormal vasculature at the right apex. Left T8 segmental vessel angiogram: No contribution to abnormal left-sided lung tissue. Left bronchial artery angiogram: Circuitous vasculature with significant contribution to abnormal lung tissue at the left apex. There is dense opacification of inflammatory tissue at the left apex. No extravasation is identified. No contribution of vasculature directed towards the spinal canal. Status post empiric embolization with 500-700 embospheres, no significant contribution of the left bronchial artery to the left apex. IMPRESSION: Status post thoracic aortic and segmental vessel angiogram with empiric embolization of left bronchial artery and superior right bronchial artery, each contributing significant blood flow to abnormal enhancing inflammatory tissue at the left and right apex, respectively. Both artery were embolized to stasis, with no significant opacification of abnormal tissue at the completion. Status post Exoseal deployment. Signed, Dulcy Fanny. Earleen Newport, DO [facility] Radiology Electronically Signed   By: Corrie Mckusick D.O.  On: 10/13/2015 19:21   [place] Guide Roadmapping  10/13/2015  CLINICAL DATA:  39 year old male with a history of chronic pulmonary infection with MAC, as well as prior resection for Aspergilloma. He has developed hemoptysis, with significant bleeding on the prior night. Bronchoscopy could not be performed because of concerns for the patient's pulmonary status. Bilateral empiric embolization is planned. EXAM: ULTRASOUND GUIDED ACCESS RIGHT COMMON FEMORAL ARTERY THORACIC AORTIC ANGIOGRAM SELECTIVE ANGIOGRAM OF MULTIPLE BILATERAL THORACIC SEGMENTAL VESSELS INCLUDING BILATERAL BRONCHIAL ARTERIES. 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 Date:  11/24/201611/24/2016 3:52 pm FLUOROSCOPY TIME:  22 minutes, 24 seconds MEDICATIONS AND MEDICAL HISTORY: 1.0 mg Versed, 50 mcg fentanyl ANESTHESIA/SEDATION: 73 minutes CONTRAST:  246m OMNIPAQUE IOHEXOL 300 MG/ML  SOLN COMPLICATIONS: None PROCEDURE: Informed consent was obtained from the patient following explanation of the procedure, risks, benefits and alternatives. Specific risks include bleeding, infection, arterial injury, need for further surgery or procedure, kidney injury, contrast reaction, non targeted embolization, neurologic injury/ deficit, cardiopulmonary collapse, death The patient understands, agrees and consents for the procedure. All questions were addressed. A time out was performed. Patient is position in the supine position on the fluoroscopy table. The right inguinal region was prepped and draped in the usual sterile fashion. Maximum sterile protection was used including gown and gloves mask. 1% lidocaine was used for local anesthesia. Ultrasound survey of the right inguinal region was performed with images stored and sent to PACs. A micropuncture needle was used access the right common femoral artery under ultrasound. With  excellent arterial blood flow returned, and an .018 micro wire was passed through the needle, observed enter the abdominal aorta under fluoroscopy. The needle was removed, and a micropuncture sheath was placed over the wire. The inner dilator and wire were removed, and an 035 Bentson wire was advanced under fluoroscopy into the abdominal aorta. The sheath was removed and a standard 5 [place] sheath was placed. The dilator was removed and the sheath was flushed. Bentson wire was advanced to the aortic arch, and a pigtail catheter was passed over the wire to the aortic arch. Thoracic aortic angiogram was performed. Pigtail catheter was exchanged for a Mickelson catheter which was used to select multiple segmental vessels of the thoracic arch. Angiogram was performed of each vessel selected. Once the right bronchial artery was engaged, angiogram was performed. Micro catheter system including a high-flow Renegade catheter was passed into the right bronchial artery, at a safe distance from the origin to avoid refluxing into the aorta. Empiric embolization was then performed with 500 - 700 micro meter embospheres. One vial was used. Micro catheter system was then removed, and a final angiogram was performed through the base catheter. Mickelson catheter was then used to select multiple segmental vessels searching for left bronchial artery. Exchange was made over the Bentson wire for a shunt catheter. The Chung catheter was successful with engaging the origin of left bronchial artery. Angiogram performed. Micro catheter system was then advanced into the left bronchial artery for empiric embolization. Five hundred - 700 micro meter embospheres were used. One vial used. Repeat angiogram was performed. Angiogram of the right common femoral artery was performed. Exoseal was deployed. Patient tolerated the procedure well and remained hemodynamically stable throughout. No complications were encountered and no significant  blood loss encountered. FINDINGS: Thoracic aorta demonstrates no dissection flap or aneurysm. No  significant atherosclerotic disease along the lumen of the aorta. Multiple segmental vessels identified including 2 right bronchial artery and single left bronchial artery from the aortic angiogram. Superior right bronchial artery angiogram: Tortuous vasculature without stenosis at the origin. Vessel is of large caliber and supplies a cluster of abnormal vessels at the apex of the lung. Inflammatory tissue at the lung apex a demonstrates dense enhancement with no early draining vein identified. No evidence of arterial contribution towards the spinal canal. Status post microsphere embolization of the right superior bronchial artery there is no significant residual blood flow. Angiogram right supreme intercostal artery angiogram: No contribution to abnormal lung tissue. Angiogram right superior intercostal angiogram: No contribution to the lung apex. Left T9 segmental vessel angiogram: No contribution to the abnormal lung tissue. No reticular medullary vessel identified. Right inferior bronchial artery angiogram: No contribution to the abnormal vasculature at the right apex. Left T8 segmental vessel angiogram: No contribution to abnormal left-sided lung tissue. Left bronchial artery angiogram: Circuitous vasculature with significant contribution to abnormal lung tissue at the left apex. There is dense opacification of inflammatory tissue at the left apex. No extravasation is identified. No contribution of vasculature directed towards the spinal canal. Status post empiric embolization with 500-700 embospheres, no significant contribution of the left bronchial artery to the left apex. IMPRESSION: Status post thoracic aortic and segmental vessel angiogram with empiric embolization of left bronchial artery and superior right bronchial artery, each contributing significant blood flow to abnormal enhancing inflammatory tissue at  the left and right apex, respectively. Both artery were embolized to stasis, with no significant opacification of abnormal tissue at the completion. Status post Exoseal deployment. Signed, Dulcy Fanny. Earleen Newport, DO [facility] Radiology Electronically Signed   By: Corrie Mckusick D.O.   On: 10/13/2015 19:21    Labs:  CBC:  Recent Labs  02/16/15 1016 08/08/15 1004 10/13/15 0400 10/13/15 0420 10/14/15 0228  WBC 9.0 7.5 9.7  --  19.0*  HGB 16.9 18.3* 15.4 16.0 14.6  HCT 48.2 53.6* 44.9 47.0 42.9  PLT 239 234 237  --  203    COAGS:  Recent Labs  10/13/15 0400  INR 0.95  APTT 31    BMP:  Recent Labs  02/16/15 1016 08/08/15 1004 10/13/15 0400 10/13/15 0420 10/14/15 0228  NA 134* 139 139 137 133*  K 3.9 4.4 3.8 3.8 3.6  CL 96 98 104 108 101  CO2 _0 --  25  GLUCOSE 79 82 136* 129* 97  BUN _1 CALCIUM 9.2 9.1 8.4*  --  8.0*  CREATININE 1.04 1.04 0.93 0.90 1.00  GFRNONAA 89 89 >60  --  >60  GFRAA >89 >89 >60  --  >60    LIVER FUNCTION TESTS:  Recent Labs  02/16/15 1016 08/08/15 1004 10/13/15 0400  BILITOT 0.5 0.7 1.1  AST 32 28 24  ALT 22 23 14*  ALKPHOS 67 87 96  PROT 6.9 6.8 7.0  ALBUMIN 3.9 4.1 3.5    Assessment and Plan:  Patient seen by Dr. Anselm Pancoast. He discussed care of patient with Dr. Titus Mould.  S/P Empiric embolization of bilateral bronchial arteries by Dr. Earleen Newport 10/13/2015 No further hemoptysis.  Agree with transfer to stepdown today.  Signed: Murrell Redden PA-C 10/14/2015, 11:30 AM   I spent a total of 15 Minutes at the the patient's bedside AND on the patient's hospital floor or unit, greater than 50% of which was  counseling/coordinating care for f/u after bronchial artery embo.

## 2015-10-14 NOTE — Progress Notes (Signed)
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    11/24- Empiric embolization of bilateral bronchial arteries to stasis with 500-700 embospheres. , avoided ett and bronch as risk to not come off vent and ptx  SUBJECTIVE: no bleeding overnight  VITAL SIGNS: BP 113/68 mmHg  Pulse 101  Temp(Src) 102.6 F (39.2 C) (Oral)  Resp 23  Ht _0  (1.753 m)  Wt 60.6 kg (133 lb 9.6 oz)  BMI 19.72 kg/m2  SpO2 94%  HEMODYNAMICS:    VENTILATOR SETTINGS:    INTAKE / OUTPUT: I/O last 3 completed shifts: In: 1818.3 [P.O.:300; I.V.:1518.3] Out: 2100 [Urine:2100]  PHYSICAL EXAMINATION: General:  Thin adult male in NAD  Neuro:  AAOx4, speech clear  HEENT:  wnl JVD  Cardiovascular:  s1s2 rrr, no m/r/g Lungs:  dsiatnt Abdomen:  NTND, bsx4 active  Musculoskeletal:  No acute deformities  Skin:  Warm/dry, ruddy coloring, multiple tattoos, no rashes or lesions   LABS:  CBC  Recent Labs Lab 10/13/15 0400 10/13/15 0420 10/14/15 0228  WBC 9.7  --  19.0*  HGB 15.4 16.0 14.6  HCT 44.9 47.0 42.9  PLT 237  --  203   Coag's  Recent Labs Lab 10/13/15 0400  APTT 31  INR 0.95   BMET  Recent Labs Lab 10/13/15 0400 10/13/15 0420 10/14/15 0228  NA 139 137 133*  K 3.8 3.8 3.6  CL 104 108 101  CO2 28  --  25  BUN _1 CREATININE 0.93 0.90 1.00  GLUCOSE 136* 129* 97   Electrolytes  Recent Labs Lab 10/13/15 0400 10/14/15 0228  CALCIUM 8.4* 8.0*    Sepsis Markers  Recent Labs Lab 10/13/15 0416  LATICACIDVEN 1.44   ABG No results for input(s): PHART, PCO2ART, PO2ART in the last 168 hours. Liver Enzymes  Recent Labs Lab 10/13/15 0400  AST 24  ALT 14*  ALKPHOS 96  BILITOT 1.1  ALBUMIN 3.5   Cardiac Enzymes No results for input(s): TROPONINI, PROBNP in the last 168 hours. Glucose  Recent Labs Lab 10/13/15 0932  GLUCAP 78    Imaging    STUDIES:  11/24  CTA Chest >> no PE, new dense layering airspace in L apical bulla concerning for fungal ball, numerous scattered nodules, loculated L sided pneumothorax decreased in size, small R basilar pneumothorax, and large bilateral bullae.     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     ASSESSMENT / PLAN:  PULMONARY A: Hemoptysis - acute episode 11/24 am, approx 100-200 ml  S/p emeric embolization Bronchial arteries bilateral Acute Hypoxemia - in setting of hemoptysis  Concern for new  Fungal Ball - see 11/24 CT ABPA - on chronic prednisone  MAC - on azithro / ethambutol P:   Pulmonary hygiene Continue chronic medications - azithromycin, ethambutol, prednisone  Intermittent CXR , follow clinically for bleeding Budesonide / Pulmicort  Spiriva  Q6 PRN Albuterol  Sputum for AFB x 3 -pending  CARDIOVASCULAR A:  No acute issues P:  Dc tele  RENAL A:   S/p contrast bilateral BA at risk ATN  P:   Trend BMP / UOP  NS @ 75 ml/hr, maintain No lasix  GASTROINTESTINAL A:   Protein Calorie Malnutrition  Weight Loss - approx 10 lbs since 08/2015 P:   Regular dietnow Ensure    HEMATOLOGIC A:   Hemoptyisis, DV tprev P:  Continue home ferrous sulfate  Monitor CBC  SCD's for DVT prophylaxis   INFECTIOUS A:   Concern for new aspergilloma  Hx MAC, ABPA  unlikely TB P:   Follow cultures as above  afb pending  ENDOCRINE A:   Hypothyroidism  P:   Continue synthroid  Assess TSH  - 4  NEUROLOGIC A:   No acute issues  P:   Monitor pain   FAMILY  - Updates: Patient updated on plan of care.  To triad, floor   - Inter-disciplinary family meet or Palliative Care meeting due by:   12/2   Lavon Paganini. Titus Mould, MD, [place] Pgr: [facility] 10/14/2015 8:32 AM

## 2015-10-15 LAB — CBC WITH DIFFERENTIAL/PLATELET
BASOS ABS: 0 10*3/uL (ref 0.0–0.1)
Basophils Relative: 0 %
EOS PCT: 1 %
Eosinophils Absolute: 0.1 10*3/uL (ref 0.0–0.7)
HCT: 39.6 % (ref 39.0–52.0)
Hemoglobin: 13.5 g/dL (ref 13.0–17.0)
LYMPHS ABS: 1 10*3/uL (ref 0.7–4.0)
LYMPHS PCT: 8 %
MCH: 30.5 pg (ref 26.0–34.0)
MCHC: 34.1 g/dL (ref 30.0–36.0)
MCV: 89.6 fL (ref 78.0–100.0)
MONO ABS: 0.8 10*3/uL (ref 0.1–1.0)
Monocytes Relative: 6 %
Neutro Abs: 10.8 10*3/uL — ABNORMAL HIGH (ref 1.7–7.7)
Neutrophils Relative %: 85 %
PLATELETS: 166 10*3/uL (ref 150–400)
RBC: 4.42 MIL/uL (ref 4.22–5.81)
RDW: 13.2 % (ref 11.5–15.5)
WBC: 12.6 10*3/uL — ABNORMAL HIGH (ref 4.0–10.5)

## 2015-10-15 LAB — BASIC METABOLIC PANEL
Anion gap: 7 (ref 5–15)
BUN: 8 mg/dL (ref 6–20)
CO2: 24 mmol/L (ref 22–32)
CREATININE: 0.91 mg/dL (ref 0.61–1.24)
Calcium: 7.9 mg/dL — ABNORMAL LOW (ref 8.9–10.3)
Chloride: 105 mmol/L (ref 101–111)
GFR calc Af Amer: >60 mL/min (ref >60)
GLUCOSE: 104 mg/dL — AB (ref 65–99)
POTASSIUM: 4 mmol/L (ref 3.5–5.1)
Sodium: 136 mmol/L (ref 135–145)

## 2015-10-15 LAB — EXPECTORATED SPUTUM ASSESSMENT W GRAM STAIN, RFLX TO RESP C: Special Requests: NORMAL

## 2015-10-15 MED ORDER — PANTOPRAZOLE SODIUM 40 MG PO TBEC
40.0000 mg | DELAYED_RELEASE_TABLET | Freq: Every day | ORAL | Status: DC
  Administered 2015-10-16 – 2015-10-17 (×2): 40 mg via ORAL
  Filled 2015-10-15 (×2): qty 1

## 2015-10-15 MED ORDER — ACETAMINOPHEN 325 MG PO TABS
650.0000 mg | ORAL_TABLET | Freq: Four times a day (QID) | ORAL | Status: DC | PRN
  Administered 2015-10-15: 650 mg via ORAL
  Filled 2015-10-15: qty 2

## 2015-10-15 NOTE — Progress Notes (Signed)
Sputum specimen still pending,pt not expectorating enough to send specimen

## 2015-10-15 NOTE — Progress Notes (Addendum)
PATIENT DETAILS Name: [name] Age: 39 y.o. 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 CT angiogram of the chest was positive for aspergilloma in the left upper lung bulla. Felt to be high risk for pneumothorax/VDRF if bronchoscopy performed-hence IR was consulted for embolization-no further hemoptysis since then. Transferred to hospitalists service on 11/26.  Subjective: No further hemoptysis. No other complaints  Assessment/Plan: Hemoptysis: Felt to be secondary to aspergillosis (fungal ball seen on CT chest 11/24). Felt to be high risk for pneumothorax/VDRF if underwent bronchoscopy-hence IR consulted and patient underwent empiric embolization of bilateral bronchial arteries. No further recurrence of hemoptysis. Sputum AFB 1 negative-but 3 needed prior to discontinuing isolation. AFB blood cultures pending. Continue voriconazole (taking prior to admission-on lifelong voriconazole).PCCM following-await further recommendations.  Acute hypoxic respiratory failure: Resolved. Secondary to above, CTA chest negative for PE.  History of aspergilloma status post VATS 2012: On chronic lifelong voriconazole-followed at the infectious disease clinic. Concern for recurrent infection causing hemoptysis-see above.  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 Reviewed outpatient ID notes (Dr. Lucianne Lei dam 08/08/15)-unfortunately patient has recurrence of symptoms when Zithromax/ethambutol discontinued. Follow with ID at next  appointment.  Bullous lung disease:  Hypothyroidism: Continue Synthroid  GERD: Continue PPI  Severe protein calorie malnutrition: Continue supplements  Disposition: Remain inpatient-home when cleared by pulmonology  Antimicrobial agents  See below  Anti-infectives    Start     Dose/Rate Route Frequency Ordered Stop   10/14/15 1000  azithromycin (ZITHROMAX) tablet 500 mg     500 mg Oral Daily 10/14/15 0836     10/13/15 1500  ethambutol (MYAMBUTOL) tablet 1,000 mg     1,000 mg Oral Daily 10/13/15 1057     10/13/15 1000  voriconazole (VFEND) tablet 300 mg     300 mg Oral Every 12 hours 10/13/15 0945     10/13/15 0800  voriconazole (VFEND) 380 mg in sodium chloride 0.9 % 100 mL IVPB  Status:  Discontinued     6 mg/kg  63 kg 69 mL/hr over 120 Minutes Intravenous Every 12 hours 10/13/15 0638 10/13/15 0641   10/13/15 0745  azithromycin (ZITHROMAX) 500 mg in dextrose 5 % 250 mL IVPB     500 mg 250 mL/hr over 60 Minutes Intravenous  Once 10/13/15 0737 10/13/15 0850   10/13/15 0743  azithromycin (ZITHROMAX) 500 MG injection    Comments:  Rolla Etienne   : cabinet override      10/13/15 0743 10/13/15 0750      DVT Prophylaxis:  SCD's  Code Status: Full code  Family Communication None at bedside  Procedures: 11/24>>empiric embolization of bilateral bronchial arteries  CONSULTS:  pulmonary/intensive care and IR  Time spent 30 minutes-Greater than 50% of this time was spent in counseling, explanation of diagnosis, planning of further management, and coordination of care.  MEDICATIONS: Scheduled Meds: . arformoterol  15 mcg Nebulization BID  . azithromycin  500 mg Oral Daily  . budesonide  0.5 mg Nebulization BID  . ethambutol  1,000 mg Oral Daily  . feeding supplement (ENSURE ENLIVE)  237 mL Oral BID BM  .  ferrous sulfate  325 mg Oral Daily  . levothyroxine  50 mcg Oral QAC breakfast  . multivitamin with minerals  1 tablet Oral Daily  . predniSONE  10 mg Oral Daily   . tiotropium  18 mcg Inhalation Daily  . voriconazole  300 mg Oral Q12H   Continuous Infusions: . sodium chloride 75 mL/hr at 10/15/15 0229   PRN Meds:.acetaminophen, albuterol    PHYSICAL EXAM: Vital signs in last 24 hours: Filed Vitals:   10/14/15 1700 10/14/15 2216 10/15/15 0544 10/15/15 0929  BP:  116/68 115/71   Pulse:  93 93   Temp: 99.1 F (37.3 C) 100 F (37.8 C) 101.3 F (38.5 C)   TempSrc:  Oral Oral   Resp:   19   Height:      Weight:      SpO2:  94% 97% 97%    Weight change:  Filed Weights   10/13/15 0600 10/13/15 0922 10/14/15 0327  Weight: 63 kg (138 lb 14.2 oz) 59.3 kg (130 lb 11.7 oz) 60.6 kg (133 lb 9.6 oz)   Body mass index is 19.72 kg/(m^2).   Gen Exam: Awake and alert with clear speech.  Neck: Supple, No JVD.   Chest: B/L Clear.   CVS: S1 S2 Regular, no murmurs.  Abdomen: soft, BS +, non tender, non distended.  Extremities: no edema, lower extremities warm to touch. Neurologic: Non Focal.   Skin: No Rash.   Wounds: N/A.   Intake/Output from previous day:  Intake/Output Summary (Last 24 hours) at 10/15/15 1607 Last data filed at 10/15/15 0900  Gross per 24 hour  Intake   3129 ml  Output    400 ml  Net   2729 ml     LAB RESULTS: CBC  Recent Labs Lab 10/13/15 0400 10/13/15 0420 10/14/15 0228 10/15/15 0444  WBC 9.7  --  19.0* 12.6*  HGB 15.4 16.0 14.6 13.5  HCT 44.9 47.0 42.9 39.6  PLT 237  --  203 166  MCV 88.4  --  90.3 89.6  MCH 30.3  --  30.7 30.5  MCHC 34.3  --  34.0 34.1  RDW 13.3  --  13.3 13.2  LYMPHSABS 1.9  --   --  1.0  MONOABS 0.4  --   --  0.8  EOSABS 0.1  --   --  0.1  BASOSABS 0.0  --   --  0.0    Chemistries   Recent Labs Lab 10/13/15 0400 10/13/15 0420 10/14/15 0228 10/15/15 0444  NA 139 137 133* 136  K 3.8 3.8 3.6 4.0  CL 104 108 101 105  CO2 28  --  25 24  GLUCOSE 136* 129* 97 104*  BUN _0 CREATININE 0.93 0.90 1.00 0.91  CALCIUM 8.4*  --  8.0* 7.9*    CBG:  Recent Labs Lab  10/13/15 0932  GLUCAP 78    GFR Estimated Creatinine Clearance: 91.6 mL/min (by C-G formula based on Cr of 0.91).  Coagulation profile  Recent Labs Lab 10/13/15 0400  INR 0.95    Cardiac Enzymes No results for input(s): CKMB, TROPONINI, MYOGLOBIN in the last 168 hours.  Invalid input(s): CK  Invalid input(s): POCBNP No results for input(s): DDIMER in the last 72 hours. No results for input(s): HGBA1C in the last 72 hours. No results for input(s): CHOL, HDL, LDLCALC, TRIG, CHOLHDL, LDLDIRECT in the last 72 hours.  Recent Labs  10/13/15 1240  TSH 4.085   No results for  input(s): VITAMINB12, FOLATE, FERRITIN, TIBC, IRON, RETICCTPCT in the last 72 hours. No results for input(s): LIPASE, AMYLASE in the last 72 hours.  Urine Studies No results for input(s): UHGB, CRYS in the last 72 hours.  Invalid input(s): UACOL, UAPR, USPG, UPH, UTP, UGL, UKET, UBIL, UNIT, UROB, ULEU, UEPI, UWBC, URBC, UBAC, CAST, UCOM, BILUA  MICROBIOLOGY: Recent Results (from the past 240 hour(s))  Blood culture (routine x 2)     Status: None (Preliminary result)   Collection Time: 10/13/15  4:30 AM  Result Value Ref Range Status   Specimen Description BLOOD RIGHT ANTECUBITAL  Final   Special Requests BOTTLES DRAWN AEROBIC AND ANAEROBIC 5CC  Final   Culture   Final    NO GROWTH 2 DAYS Performed at [facility]    Report Status PENDING  Incomplete  Blood culture (routine x 2)     Status: None (Preliminary result)   Collection Time: 10/13/15  4:35 AM  Result Value Ref Range Status   Specimen Description BLOOD BLOOD RIGHT FOREARM  Final   Special Requests BOTTLES DRAWN AEROBIC AND ANAEROBIC 5CC  Final   Culture   Final    NO GROWTH 2 DAYS Performed at [facility]    Report Status PENDING  Incomplete  MRSA PCR Screening     Status: None   Collection Time: 10/13/15  9:15 AM  Result Value Ref Range Status   MRSA by PCR NEGATIVE NEGATIVE Final    Comment:        The GeneXpert  MRSA Assay (FDA approved for NASAL specimens only), is one component of a comprehensive MRSA colonization surveillance program. It is not intended to diagnose MRSA infection nor to guide or monitor treatment for MRSA infections.   AFB culture with smear     Status: None (Preliminary result)   Collection Time: 10/13/15 12:00 PM  Result Value Ref Range Status   Specimen Description SPUTUM  Final   Special Requests Immunocompromised  Final   Acid Fast Smear   Final    NO ACID FAST BACILLI SEEN Performed at [facility]    Culture   Final    CULTURE WILL BE EXAMINED FOR 6 WEEKS BEFORE ISSUING A FINAL REPORT Performed at [facility]    Report Status PENDING  Incomplete  Culture, expectorated sputum-assessment     Status: None   Collection Time: 10/13/15 12:00 PM  Result Value Ref Range Status   Specimen Description EXPECTORATED SPUTUM  Final   Special Requests Immunocompromised  Final   Sputum evaluation   Final    THIS SPECIMEN IS ACCEPTABLE. RESPIRATORY CULTURE REPORT TO FOLLOW.   Report Status 10/13/2015 FINAL  Final  Culture, respiratory (NON-Expectorated)     Status: None (Preliminary result)   Collection Time: 10/13/15 12:00 PM  Result Value Ref Range Status   Specimen Description SPUTUM  Final   Special Requests NONE  Final   Gram Stain   Final    ABUNDANT WBC PRESENT,BOTH PMN AND MONONUCLEAR RARE SQUAMOUS EPITHELIAL CELLS PRESENT MODERATE GRAM POSITIVE COCCI IN PAIRS IN CHAINS IN CLUSTERS Performed at [facility]    Culture   Final    Culture reincubated for better growth Performed at [facility]    Report Status PENDING  Incomplete  Culture, expectorated sputum-assessment     Status: None   Collection Time: 10/15/15  2:26 AM  Result Value Ref Range Status   Specimen Description SPUTUM  Final   Special Requests Normal  Final   Sputum evaluation   Final    THIS SPECIMEN IS ACCEPTABLE. RESPIRATORY CULTURE REPORT TO FOLLOW.    Report Status 10/15/2015 FINAL  Final    RADIOLOGY STUDIES/RESULTS: Ct Angio Chest Pe W/cm &/or Wo Cm  10/13/2015  CLINICAL DATA:  Acute onset of hemoptysis, chest tightness and shortness of breath. Initial encounter. EXAM: CT ANGIOGRAPHY CHEST WITH CONTRAST TECHNIQUE: Multidetector CT imaging of the chest was performed using the standard protocol during bolus administration of intravenous contrast. Multiplanar CT image reconstructions and MIPs were obtained to evaluate the vascular anatomy. CONTRAST:  115m OMNIPAQUE IOHEXOL 350 MG/ML SOLN COMPARISON:  Chest radiograph performed earlier today at 4:25 a.m., and CT of the chest performed 03/05/2011 FINDINGS: There is no evidence of pulmonary embolus. The previously noted loculated left-sided pneumothorax has decreased significantly in size. The large bilateral bullae are again noted. There is new dense layering airspace opacification at the large left apical bulla, extending inferiorly along the posterior aspect of the left upper lobe, suspicious for acute fungal infection given its appearance. 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 This is in a slightly different distribution from the prior study, but appears relatively stable. There is no evidence of pleural effusion. A new small right basilar pneumothorax is noted. Vague borderline prominent aortopulmonary window and subcarinal nodes are suggested, measuring up to 1.1 cm in short axis. The mediastinum is otherwise unremarkable. No pericardial effusion is identified. The great vessels are grossly unremarkable. No axillary lymphadenopathy is seen. The thyroid gland is unremarkable in appearance. The visualized portions of the liver and spleen are unremarkable. The visualized portions of the pancreas, stomach, adrenal glands and left kidney are within normal limits. No acute osseous abnormalities are seen. Review of the MIP  images confirms the above findings. IMPRESSION: 1. No evidence of pulmonary embolus. 2. New dense layering airspace opacification at the patient's large left apical bulla, extending inferiorly along the posterior aspect of the left upper lobe. This is suspicious for acute fungal infection (i.e., a fungal ball), given its appearance. 3. 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 4. Previously noted loculated left-sided pneumothorax has decreased significantly in size. 5. New small right basilar pneumothorax noted. 6. Underlying large bilateral bullae again noted. 7. Suggestion of mildly prominent mediastinal nodes. These results were called by telephone at the time of interpretation on 10/13/2015 at 6:05 am to Dr. KPryor Curia who verbally acknowledged these results. Electronically Signed   By: JGarald BaldingM.D.   On: 10/13/2015 06:05   Ir Aorta/thoracic  10/13/2015  CLINICAL DATA:  39year old male with a history of chronic pulmonary infection with MAC, as well as prior resection for Aspergilloma. He has developed hemoptysis, with significant bleeding on the prior night. Bronchoscopy could not be performed because of concerns for the patient's pulmonary status. Bilateral empiric embolization is planned. EXAM: ULTRASOUND GUIDED ACCESS RIGHT COMMON FEMORAL ARTERY THORACIC AORTIC ANGIOGRAM SELECTIVE ANGIOGRAM OF MULTIPLE BILATERAL THORACIC SEGMENTAL VESSELS INCLUDING BILATERAL BRONCHIAL ARTERIES. 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 Date:  11/24/201611/24/2016 3:52 pm FLUOROSCOPY TIME:  22 minutes, 24 seconds MEDICATIONS AND MEDICAL HISTORY: 1.0 mg Versed, 50 mcg fentanyl ANESTHESIA/SEDATION: 73 minutes CONTRAST:  2037mOMNIPAQUE IOHEXOL 300 MG/ML  SOLN COMPLICATIONS: None PROCEDURE: Informed consent was  obtained from the patient following explanation of the procedure, risks, benefits  and alternatives. Specific risks include bleeding, infection, arterial injury, need for further surgery or procedure, kidney injury, contrast reaction, non targeted embolization, neurologic injury/ deficit, cardiopulmonary collapse, death The patient understands, agrees and consents for the procedure. All questions were addressed. A time out was performed. Patient is position in the supine position on the fluoroscopy table. The right inguinal region was prepped and draped in the usual sterile fashion. Maximum sterile protection was used including gown and gloves mask. 1% lidocaine was used for local anesthesia. Ultrasound survey of the right inguinal region was performed with images stored and sent to PACs. A micropuncture needle was used access the right common femoral artery under ultrasound. With excellent arterial blood flow returned, and an .018 micro wire was passed through the needle, observed enter the abdominal aorta under fluoroscopy. The needle was removed, and a micropuncture sheath was placed over the wire. The inner dilator and wire were removed, and an 035 Bentson wire was advanced under fluoroscopy into the abdominal aorta. The sheath was removed and a standard 5 [place] vascular sheath was placed. The dilator was removed and the sheath was flushed. Bentson wire was advanced to the aortic arch, and a pigtail catheter was passed over the wire to the aortic arch. Thoracic aortic angiogram was performed. Pigtail catheter was exchanged for a Mickelson catheter which was used to select multiple segmental vessels of the thoracic arch. Angiogram was performed of each vessel selected. Once the right bronchial artery was engaged, angiogram was performed. Micro catheter system including a high-flow Renegade catheter was passed into the right bronchial artery, at a safe distance from the origin to avoid refluxing into the aorta.  Empiric embolization was then performed with 500 - 700 micro meter embospheres. One vial was used. Micro catheter system was then removed, and a final angiogram was performed through the base catheter. Mickelson catheter was then used to select multiple segmental vessels searching for left bronchial artery. Exchange was made over the Bentson wire for a shunt catheter. The Chung catheter was successful with engaging the origin of left bronchial artery. Angiogram performed. Micro catheter system was then advanced into the left bronchial artery for empiric embolization. Five hundred - 700 micro meter embospheres were used. One vial used. Repeat angiogram was performed. Angiogram of the right common femoral artery was performed. Exoseal was deployed. Patient tolerated the procedure well and remained hemodynamically stable throughout. No complications were encountered and no significant blood loss encountered. FINDINGS: Thoracic aorta demonstrates no dissection flap or aneurysm. No significant atherosclerotic disease along the lumen of the aorta. Multiple segmental vessels identified including 2 right bronchial artery and single left bronchial artery from the aortic angiogram. Superior right bronchial artery angiogram: Tortuous vasculature without stenosis at the origin. Vessel is of large caliber and supplies a cluster of abnormal vessels at the apex of the lung. Inflammatory tissue at the lung apex a demonstrates dense enhancement with no early draining vein identified. No evidence of arterial contribution towards the spinal canal. Status post microsphere embolization of the right superior bronchial artery there is no significant residual blood flow. Angiogram right supreme intercostal artery angiogram: No contribution to abnormal lung tissue. Angiogram right superior intercostal angiogram: No contribution to the lung apex. Left T9 segmental vessel angiogram: No contribution to the abnormal lung tissue. No reticular  medullary vessel identified. Right inferior bronchial artery angiogram: No contribution to the abnormal vasculature at the right apex. Left T8 segmental vessel angiogram: No contribution to abnormal left-sided lung tissue. Left bronchial  artery angiogram: Circuitous vasculature with significant contribution to abnormal lung tissue at the left apex. There is dense opacification of inflammatory tissue at the left apex. No extravasation is identified. No contribution of vasculature directed towards the spinal canal. Status post empiric embolization with 500-700 embospheres, no significant contribution of the left bronchial artery to the left apex. IMPRESSION: Status post thoracic aortic and segmental vessel angiogram with empiric embolization of left bronchial artery and superior right bronchial artery, each contributing significant blood flow to abnormal enhancing inflammatory tissue at the left and right apex, respectively. Both artery were embolized to stasis, with no significant opacification of abnormal tissue at the completion. Status post Exoseal deployment. Signed, Dulcy Fanny. Earleen Newport, DO [facility] Electronically Signed   By: Corrie Mckusick D.O.   On: 10/13/2015 19:21   Ir Angiogram Selective Each Additional Vessel  10/13/2015  CLINICAL DATA:  39 year old male with a history of chronic pulmonary infection with MAC, as well as prior resection for Aspergilloma. He has developed hemoptysis, with significant bleeding on the prior night. Bronchoscopy could not be performed because of concerns for the patient's pulmonary status. Bilateral empiric embolization is planned. EXAM: ULTRASOUND GUIDED ACCESS RIGHT COMMON FEMORAL ARTERY THORACIC AORTIC ANGIOGRAM SELECTIVE ANGIOGRAM OF MULTIPLE BILATERAL THORACIC SEGMENTAL VESSELS INCLUDING BILATERAL BRONCHIAL ARTERIES. EMPIRIC EMBOLIZATION WITH 500-700 MICRO METER EMBOSPHERES OF BILATERAL BRONCHIAL ARTERIES  CONTRIBUTING TO ABNORMAL LUNG PARENCHYMA AS THE MOST LIKELY SOURCE OF HEMOPTYSIS. Date:  11/24/201611/24/2016 3:52 pm FLUOROSCOPY TIME:  22 minutes, 24 seconds MEDICATIONS AND MEDICAL HISTORY: 1.0 mg Versed, 50 mcg fentanyl ANESTHESIA/SEDATION: 73 minutes CONTRAST:  238m OMNIPAQUE IOHEXOL 300 MG/ML  SOLN COMPLICATIONS: None PROCEDURE: Informed consent was obtained from the patient following explanation of the procedure, risks, benefits and alternatives. Specific risks include bleeding, infection, arterial injury, need for further surgery or procedure, kidney injury, contrast reaction, non targeted embolization, neurologic injury/ deficit, cardiopulmonary collapse, death The patient understands, agrees and consents for the procedure. All questions were addressed. A time out was performed. Patient is position in the supine position on the fluoroscopy table. The right inguinal region was prepped and draped in the usual sterile fashion. Maximum sterile protection was used including gown and gloves mask. 1% lidocaine was used for local anesthesia. Ultrasound survey of the right inguinal region was performed with images stored and sent to PACs. A micropuncture needle was used access the right common femoral artery under ultrasound. With excellent arterial blood flow returned, and an .018 micro wire was passed through the needle, observed enter the abdominal aorta under fluoroscopy. The needle was removed, and a micropuncture sheath was placed over the wire. The inner dilator and wire were removed, and an 035 Bentson wire was advanced under fluoroscopy into the abdominal aorta. The sheath was removed and a standard 5 [place] sheath was placed. The dilator was removed and the sheath was flushed. Bentson wire was advanced to the aortic arch, and a pigtail catheter was passed over the wire to the aortic arch. Thoracic aortic angiogram was performed. Pigtail catheter was exchanged for a Mickelson catheter which was  used to select multiple segmental vessels of the thoracic arch. Angiogram was performed of each vessel selected. Once the right bronchial artery was engaged, angiogram was performed. Micro catheter system including a high-flow Renegade catheter was passed into the right bronchial artery, at a safe distance from the origin to avoid refluxing into the aorta. Empiric embolization was then performed with 500 - 700 micro meter embospheres. One  vial was used. Micro catheter system was then removed, and a final angiogram was performed through the base catheter. Mickelson catheter was then used to select multiple segmental vessels searching for left bronchial artery. Exchange was made over the Bentson wire for a shunt catheter. The Chung catheter was successful with engaging the origin of left bronchial artery. Angiogram performed. Micro catheter system was then advanced into the left bronchial artery for empiric embolization. Five hundred - 700 micro meter embospheres were used. One vial used. Repeat angiogram was performed. Angiogram of the right common femoral artery was performed. Exoseal was deployed. Patient tolerated the procedure well and remained hemodynamically stable throughout. No complications were encountered and no significant blood loss encountered. FINDINGS: Thoracic aorta demonstrates no dissection flap or aneurysm. No significant atherosclerotic disease along the lumen of the aorta. Multiple segmental vessels identified including 2 right bronchial artery and single left bronchial artery from the aortic angiogram. Superior right bronchial artery angiogram: Tortuous vasculature without stenosis at the origin. Vessel is of large caliber and supplies a cluster of abnormal vessels at the apex of the lung. Inflammatory tissue at the lung apex a demonstrates dense enhancement with no early draining vein identified. No evidence of arterial contribution towards the spinal canal. Status post microsphere  embolization of the right superior bronchial artery there is no significant residual blood flow. Angiogram right supreme intercostal artery angiogram: No contribution to abnormal lung tissue. Angiogram right superior intercostal angiogram: No contribution to the lung apex. Left T9 segmental vessel angiogram: No contribution to the abnormal lung tissue. No reticular medullary vessel identified. Right inferior bronchial artery angiogram: No contribution to the abnormal vasculature at the right apex. Left T8 segmental vessel angiogram: No contribution to abnormal left-sided lung tissue. Left bronchial artery angiogram: Circuitous vasculature with significant contribution to abnormal lung tissue at the left apex. There is dense opacification of inflammatory tissue at the left apex. No extravasation is identified. No contribution of vasculature directed towards the spinal canal. Status post empiric embolization with 500-700 embospheres, no significant contribution of the left bronchial artery to the left apex. IMPRESSION: Status post thoracic aortic and segmental vessel angiogram with empiric embolization of left bronchial artery and superior right bronchial artery, each contributing significant blood flow to abnormal enhancing inflammatory tissue at the left and right apex, respectively. Both artery were embolized to stasis, with no significant opacification of abnormal tissue at the completion. Status post Exoseal deployment. Signed, Dulcy Fanny. Earleen Newport, DO [facility] Radiology Electronically Signed   By: Corrie Mckusick D.O.   On: 10/13/2015 19:21   Ir Angiogram Selective Each Additional Vessel  10/13/2015  CLINICAL DATA:  39 year old male with a history of chronic pulmonary infection with MAC, as well as prior resection for Aspergilloma. He has developed hemoptysis, with significant bleeding on the prior night. Bronchoscopy could not be performed because of concerns for  the patient's pulmonary status. Bilateral empiric embolization is planned. EXAM: ULTRASOUND GUIDED ACCESS RIGHT COMMON FEMORAL ARTERY THORACIC AORTIC ANGIOGRAM SELECTIVE ANGIOGRAM OF MULTIPLE BILATERAL THORACIC SEGMENTAL VESSELS INCLUDING BILATERAL BRONCHIAL ARTERIES. 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 Date:  11/24/201611/24/2016 3:52 pm FLUOROSCOPY TIME:  22 minutes, 24 seconds MEDICATIONS AND MEDICAL HISTORY: 1.0 mg Versed, 50 mcg fentanyl ANESTHESIA/SEDATION: 73 minutes CONTRAST:  254m OMNIPAQUE IOHEXOL 300 MG/ML  SOLN COMPLICATIONS: None PROCEDURE: Informed consent was obtained from the patient following explanation of the procedure,  risks, benefits and alternatives. Specific risks include bleeding, infection, arterial injury, need for further surgery or procedure, kidney injury, contrast reaction, non targeted embolization, neurologic injury/ deficit, cardiopulmonary collapse, death The patient understands, agrees and consents for the procedure. All questions were addressed. A time out was performed. Patient is position in the supine position on the fluoroscopy table. The right inguinal region was prepped and draped in the usual sterile fashion. Maximum sterile protection was used including gown and gloves mask. 1% lidocaine was used for local anesthesia. Ultrasound survey of the right inguinal region was performed with images stored and sent to PACs. A micropuncture needle was used access the right common femoral artery under ultrasound. With excellent arterial blood flow returned, and an .018 micro wire was passed through the needle, observed enter the abdominal aorta under fluoroscopy. The needle was removed, and a micropuncture sheath was placed over the wire. The inner dilator and wire were removed, and an 035 Bentson wire was advanced under fluoroscopy into the  abdominal aorta. The sheath was removed and a standard 5 [place] vascular sheath was placed. The dilator was removed and the sheath was flushed. Bentson wire was advanced to the aortic arch, and a pigtail catheter was passed over the wire to the aortic arch. Thoracic aortic angiogram was performed. Pigtail catheter was exchanged for a Mickelson catheter which was used to select multiple segmental vessels of the thoracic arch. Angiogram was performed of each vessel selected. Once the right bronchial artery was engaged, angiogram was performed. Micro catheter system including a high-flow Renegade catheter was passed into the right bronchial artery, at a safe distance from the origin to avoid refluxing into the aorta. Empiric embolization was then performed with 500 - 700 micro meter embospheres. One vial was used. Micro catheter system was then removed, and a final angiogram was performed through the base catheter. Mickelson catheter was then used to select multiple segmental vessels searching for left bronchial artery. Exchange was made over the Bentson wire for a shunt catheter. The Chung catheter was successful with engaging the origin of left bronchial artery. Angiogram performed. Micro catheter system was then advanced into the left bronchial artery for empiric embolization. Five hundred - 700 micro meter embospheres were used. One vial used. Repeat angiogram was performed. Angiogram of the right common femoral artery was performed. Exoseal was deployed. Patient tolerated the procedure well and remained hemodynamically stable throughout. No complications were encountered and no significant blood loss encountered. FINDINGS: Thoracic aorta demonstrates no dissection flap or aneurysm. No significant atherosclerotic disease along the lumen of the aorta. Multiple segmental vessels identified including 2 right bronchial artery and single left bronchial artery from the aortic angiogram. Superior right bronchial artery  angiogram: Tortuous vasculature without stenosis at the origin. Vessel is of large caliber and supplies a cluster of abnormal vessels at the apex of the lung. Inflammatory tissue at the lung apex a demonstrates dense enhancement with no early draining vein identified. No evidence of arterial contribution towards the spinal canal. Status post microsphere embolization of the right superior bronchial artery there is no significant residual blood flow. Angiogram right supreme intercostal artery angiogram: No contribution to abnormal lung tissue. Angiogram right superior intercostal angiogram: No contribution to the lung apex. Left T9 segmental vessel angiogram: No contribution to the abnormal lung tissue. No reticular medullary vessel identified. Right inferior bronchial artery angiogram: No contribution to the abnormal vasculature at the right apex. Left T8 segmental vessel angiogram: No contribution to abnormal left-sided lung tissue.  Left bronchial artery angiogram: Circuitous vasculature with significant contribution to abnormal lung tissue at the left apex. There is dense opacification of inflammatory tissue at the left apex. No extravasation is identified. No contribution of vasculature directed towards the spinal canal. Status post empiric embolization with 500-700 embospheres, no significant contribution of the left bronchial artery to the left apex. IMPRESSION: Status post thoracic aortic and segmental vessel angiogram with empiric embolization of left bronchial artery and superior right bronchial artery, each contributing significant blood flow to abnormal enhancing inflammatory tissue at the left and right apex, respectively. Both artery were embolized to stasis, with no significant opacification of abnormal tissue at the completion. Status post Exoseal deployment. Signed, Dulcy Fanny. Earleen Newport, DO [facility] Radiology Electronically Signed   By: Corrie Mckusick D.O.    On: 10/13/2015 19:21   Ir Angiogram Selective Each Additional Vessel  10/13/2015  CLINICAL DATA:  39 year old male with a history of chronic pulmonary infection with MAC, as well as prior resection for Aspergilloma. He has developed hemoptysis, with significant bleeding on the prior night. Bronchoscopy could not be performed because of concerns for the patient's pulmonary status. Bilateral empiric embolization is planned. EXAM: ULTRASOUND GUIDED ACCESS RIGHT COMMON FEMORAL ARTERY THORACIC AORTIC ANGIOGRAM SELECTIVE ANGIOGRAM OF MULTIPLE BILATERAL THORACIC SEGMENTAL VESSELS INCLUDING BILATERAL BRONCHIAL ARTERIES. 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 Date:  11/24/201611/24/2016 3:52 pm FLUOROSCOPY TIME:  22 minutes, 24 seconds MEDICATIONS AND MEDICAL HISTORY: 1.0 mg Versed, 50 mcg fentanyl ANESTHESIA/SEDATION: 73 minutes CONTRAST:  275m OMNIPAQUE IOHEXOL 300 MG/ML  SOLN COMPLICATIONS: None PROCEDURE: Informed consent was obtained from the patient following explanation of the procedure, risks, benefits and alternatives. Specific risks include bleeding, infection, arterial injury, need for further surgery or procedure, kidney injury, contrast reaction, non targeted embolization, neurologic injury/ deficit, cardiopulmonary collapse, death The patient understands, agrees and consents for the procedure. All questions were addressed. A time out was performed. Patient is position in the supine position on the fluoroscopy table. The right inguinal region was prepped and draped in the usual sterile fashion. Maximum sterile protection was used including gown and gloves mask. 1% lidocaine was used for local anesthesia. Ultrasound survey of the right inguinal region was performed with images stored and sent to PACs. A micropuncture needle was used access the right common femoral artery under  ultrasound. With excellent arterial blood flow returned, and an .018 micro wire was passed through the needle, observed enter the abdominal aorta under fluoroscopy. The needle was removed, and a micropuncture sheath was placed over the wire. The inner dilator and wire were removed, and an 035 Bentson wire was advanced under fluoroscopy into the abdominal aorta. The sheath was removed and a standard 5 [place] sheath was placed. The dilator was removed and the sheath was flushed. Bentson wire was advanced to the aortic arch, and a pigtail catheter was passed over the wire to the aortic arch. Thoracic aortic angiogram was performed. Pigtail catheter was exchanged for a Mickelson catheter which was used to select multiple segmental vessels of the thoracic arch. Angiogram was performed of each vessel selected. Once the right bronchial artery was engaged, angiogram was performed. Micro catheter system including a high-flow Renegade catheter was passed into the right bronchial artery, at a safe distance from the origin to avoid refluxing into the aorta. Empiric embolization was then performed with 500 - 700 micro meter  embospheres. One vial was used. Micro catheter system was then removed, and a final angiogram was performed through the base catheter. Mickelson catheter was then used to select multiple segmental vessels searching for left bronchial artery. Exchange was made over the Bentson wire for a shunt catheter. The Chung catheter was successful with engaging the origin of left bronchial artery. Angiogram performed. Micro catheter system was then advanced into the left bronchial artery for empiric embolization. Five hundred - 700 micro meter embospheres were used. One vial used. Repeat angiogram was performed. Angiogram of the right common femoral artery was performed. Exoseal was deployed. Patient tolerated the procedure well and remained hemodynamically stable throughout. No complications were encountered  and no significant blood loss encountered. FINDINGS: Thoracic aorta demonstrates no dissection flap or aneurysm. No significant atherosclerotic disease along the lumen of the aorta. Multiple segmental vessels identified including 2 right bronchial artery and single left bronchial artery from the aortic angiogram. Superior right bronchial artery angiogram: Tortuous vasculature without stenosis at the origin. Vessel is of large caliber and supplies a cluster of abnormal vessels at the apex of the lung. Inflammatory tissue at the lung apex a demonstrates dense enhancement with no early draining vein identified. No evidence of arterial contribution towards the spinal canal. Status post microsphere embolization of the right superior bronchial artery there is no significant residual blood flow. Angiogram right supreme intercostal artery angiogram: No contribution to abnormal lung tissue. Angiogram right superior intercostal angiogram: No contribution to the lung apex. Left T9 segmental vessel angiogram: No contribution to the abnormal lung tissue. No reticular medullary vessel identified. Right inferior bronchial artery angiogram: No contribution to the abnormal vasculature at the right apex. Left T8 segmental vessel angiogram: No contribution to abnormal left-sided lung tissue. Left bronchial artery angiogram: Circuitous vasculature with significant contribution to abnormal lung tissue at the left apex. There is dense opacification of inflammatory tissue at the left apex. No extravasation is identified. No contribution of vasculature directed towards the spinal canal. Status post empiric embolization with 500-700 embospheres, no significant contribution of the left bronchial artery to the left apex. IMPRESSION: Status post thoracic aortic and segmental vessel angiogram with empiric embolization of left bronchial artery and superior right bronchial artery, each contributing significant blood flow to abnormal enhancing  inflammatory tissue at the left and right apex, respectively. Both artery were embolized to stasis, with no significant opacification of abnormal tissue at the completion. Status post Exoseal deployment. Signed, Dulcy Fanny. Earleen Newport, DO [facility] Radiology Electronically Signed   By: Corrie Mckusick D.O.   On: 10/13/2015 19:21   Ir Angiogram Selective Each Additional Vessel  10/13/2015  CLINICAL DATA:  39 year old male with a history of chronic pulmonary infection with MAC, as well as prior resection for Aspergilloma. He has developed hemoptysis, with significant bleeding on the prior night. Bronchoscopy could not be performed because of concerns for the patient's pulmonary status. Bilateral empiric embolization is planned. EXAM: ULTRASOUND GUIDED ACCESS RIGHT COMMON FEMORAL ARTERY THORACIC AORTIC ANGIOGRAM SELECTIVE ANGIOGRAM OF MULTIPLE BILATERAL THORACIC SEGMENTAL VESSELS INCLUDING BILATERAL BRONCHIAL ARTERIES. 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 Date:  11/24/201611/24/2016 3:52 pm FLUOROSCOPY TIME:  22 minutes, 24 seconds MEDICATIONS AND MEDICAL HISTORY: 1.0 mg Versed, 50 mcg fentanyl ANESTHESIA/SEDATION: 73 minutes CONTRAST:  218m OMNIPAQUE IOHEXOL 300 MG/ML  SOLN COMPLICATIONS: None PROCEDURE: Informed consent was obtained from the patient following explanation of  the procedure, risks, benefits and alternatives. Specific risks include bleeding, infection, arterial injury, need for further surgery or procedure, kidney injury, contrast reaction, non targeted embolization, neurologic injury/ deficit, cardiopulmonary collapse, death The patient understands, agrees and consents for the procedure. All questions were addressed. A time out was performed. Patient is position in the supine position on the fluoroscopy table. The right  inguinal region was prepped and draped in the usual sterile fashion. Maximum sterile protection was used including gown and gloves mask. 1% lidocaine was used for local anesthesia. Ultrasound survey of the right inguinal region was performed with images stored and sent to PACs. A micropuncture needle was used access the right common femoral artery under ultrasound. With excellent arterial blood flow returned, and an .018 micro wire was passed through the needle, observed enter the abdominal aorta under fluoroscopy. The needle was removed, and a micropuncture sheath was placed over the wire. The inner dilator and wire were removed, and an 035 Bentson wire was advanced under fluoroscopy into the abdominal aorta. The sheath was removed and a standard 5 [place] vascular sheath was placed. The dilator was removed and the sheath was flushed. Bentson wire was advanced to the aortic arch, and a pigtail catheter was passed over the wire to the aortic arch. Thoracic aortic angiogram was performed. Pigtail catheter was exchanged for a Mickelson catheter which was used to select multiple segmental vessels of the thoracic arch. Angiogram was performed of each vessel selected. Once the right bronchial artery was engaged, angiogram was performed. Micro catheter system including a high-flow Renegade catheter was passed into the right bronchial artery, at a safe distance from the origin to avoid refluxing into the aorta. Empiric embolization was then performed with 500 - 700 micro meter embospheres. One vial was used. Micro catheter system was then removed, and a final angiogram was performed through the base catheter. Mickelson catheter was then used to select multiple segmental vessels searching for left bronchial artery. Exchange was made over the Bentson wire for a shunt catheter. The Chung catheter was successful with engaging the origin of left bronchial artery. Angiogram performed. Micro catheter system was then advanced into  the left bronchial artery for empiric embolization. Five hundred - 700 micro meter embospheres were used. One vial used. Repeat angiogram was performed. Angiogram of the right common femoral artery was performed. Exoseal was deployed. Patient tolerated the procedure well and remained hemodynamically stable throughout. No complications were encountered and no significant blood loss encountered. FINDINGS: Thoracic aorta demonstrates no dissection flap or aneurysm. No significant atherosclerotic disease along the lumen of the aorta. Multiple segmental vessels identified including 2 right bronchial artery and single left bronchial artery from the aortic angiogram. Superior right bronchial artery angiogram: Tortuous vasculature without stenosis at the origin. Vessel is of large caliber and supplies a cluster of abnormal vessels at the apex of the lung. Inflammatory tissue at the lung apex a demonstrates dense enhancement with no early draining vein identified. No evidence of arterial contribution towards the spinal canal. Status post microsphere embolization of the right superior bronchial artery there is no significant residual blood flow. Angiogram right supreme intercostal artery angiogram: No contribution to abnormal lung tissue. Angiogram right superior intercostal angiogram: No contribution to the lung apex. Left T9 segmental vessel angiogram: No contribution to the abnormal lung tissue. No reticular medullary vessel identified. Right inferior bronchial artery angiogram: No contribution to the abnormal vasculature at the right apex. Left T8 segmental vessel angiogram: No contribution to abnormal left-sided  lung tissue. Left bronchial artery angiogram: Circuitous vasculature with significant contribution to abnormal lung tissue at the left apex. There is dense opacification of inflammatory tissue at the left apex. No extravasation is identified. No contribution of vasculature directed towards the spinal canal.  Status post empiric embolization with 500-700 embospheres, no significant contribution of the left bronchial artery to the left apex. IMPRESSION: Status post thoracic aortic and segmental vessel angiogram with empiric embolization of left bronchial artery and superior right bronchial artery, each contributing significant blood flow to abnormal enhancing inflammatory tissue at the left and right apex, respectively. Both artery were embolized to stasis, with no significant opacification of abnormal tissue at the completion. Status post Exoseal deployment. Signed, Dulcy Fanny. Earleen Newport, DO [facility] Radiology Electronically Signed   By: Corrie Mckusick D.O.   On: 10/13/2015 19:21   Ir Angiogram Selective Each Additional Vessel  10/13/2015  CLINICAL DATA:  39 year old male with a history of chronic pulmonary infection with MAC, as well as prior resection for Aspergilloma. He has developed hemoptysis, with significant bleeding on the prior night. Bronchoscopy could not be performed because of concerns for the patient's pulmonary status. Bilateral empiric embolization is planned. EXAM: ULTRASOUND GUIDED ACCESS RIGHT COMMON FEMORAL ARTERY THORACIC AORTIC ANGIOGRAM SELECTIVE ANGIOGRAM OF MULTIPLE BILATERAL THORACIC SEGMENTAL VESSELS INCLUDING BILATERAL BRONCHIAL ARTERIES. 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 Date:  11/24/201611/24/2016 3:52 pm FLUOROSCOPY TIME:  22 minutes, 24 seconds MEDICATIONS AND MEDICAL HISTORY: 1.0 mg Versed, 50 mcg fentanyl ANESTHESIA/SEDATION: 73 minutes CONTRAST:  233m OMNIPAQUE IOHEXOL 300 MG/ML  SOLN COMPLICATIONS: None PROCEDURE: Informed consent was obtained from the patient following explanation of the procedure, risks, benefits and alternatives. Specific risks include bleeding, infection, arterial injury, need  for further surgery or procedure, kidney injury, contrast reaction, non targeted embolization, neurologic injury/ deficit, cardiopulmonary collapse, death The patient understands, agrees and consents for the procedure. All questions were addressed. A time out was performed. Patient is position in the supine position on the fluoroscopy table. The right inguinal region was prepped and draped in the usual sterile fashion. Maximum sterile protection was used including gown and gloves mask. 1% lidocaine was used for local anesthesia. Ultrasound survey of the right inguinal region was performed with images stored and sent to PACs. A micropuncture needle was used access the right common femoral artery under ultrasound. With excellent arterial blood flow returned, and an .018 micro wire was passed through the needle, observed enter the abdominal aorta under fluoroscopy. The needle was removed, and a micropuncture sheath was placed over the wire. The inner dilator and wire were removed, and an 035 Bentson wire was advanced under fluoroscopy into the abdominal aorta. The sheath was removed and a standard 5 [place] sheath was placed. The dilator was removed and the sheath was flushed. Bentson wire was advanced to the aortic arch, and a pigtail catheter was passed over the wire to the aortic arch. Thoracic aortic angiogram was performed. Pigtail catheter was exchanged for a Mickelson catheter which was used to select multiple segmental vessels of the thoracic arch. Angiogram was performed of each vessel selected. Once the right bronchial artery was engaged, angiogram was performed. Micro catheter system including a high-flow Renegade catheter was passed into the right bronchial artery, at a safe distance from the origin to avoid refluxing into the aorta. Empiric embolization was then performed with 500 - 700  micro meter embospheres. One vial was used. Micro catheter system was then removed, and a final angiogram was  performed through the base catheter. Mickelson catheter was then used to select multiple segmental vessels searching for left bronchial artery. Exchange was made over the Bentson wire for a shunt catheter. The Chung catheter was successful with engaging the origin of left bronchial artery. Angiogram performed. Micro catheter system was then advanced into the left bronchial artery for empiric embolization. Five hundred - 700 micro meter embospheres were used. One vial used. Repeat angiogram was performed. Angiogram of the right common femoral artery was performed. Exoseal was deployed. Patient tolerated the procedure well and remained hemodynamically stable throughout. No complications were encountered and no significant blood loss encountered. FINDINGS: Thoracic aorta demonstrates no dissection flap or aneurysm. No significant atherosclerotic disease along the lumen of the aorta. Multiple segmental vessels identified including 2 right bronchial artery and single left bronchial artery from the aortic angiogram. Superior right bronchial artery angiogram: Tortuous vasculature without stenosis at the origin. Vessel is of large caliber and supplies a cluster of abnormal vessels at the apex of the lung. Inflammatory tissue at the lung apex a demonstrates dense enhancement with no early draining vein identified. No evidence of arterial contribution towards the spinal canal. Status post microsphere embolization of the right superior bronchial artery there is no significant residual blood flow. Angiogram right supreme intercostal artery angiogram: No contribution to abnormal lung tissue. Angiogram right superior intercostal angiogram: No contribution to the lung apex. Left T9 segmental vessel angiogram: No contribution to the abnormal lung tissue. No reticular medullary vessel identified. Right inferior bronchial artery angiogram: No contribution to the abnormal vasculature at the right apex. Left T8 segmental vessel  angiogram: No contribution to abnormal left-sided lung tissue. Left bronchial artery angiogram: Circuitous vasculature with significant contribution to abnormal lung tissue at the left apex. There is dense opacification of inflammatory tissue at the left apex. No extravasation is identified. No contribution of vasculature directed towards the spinal canal. Status post empiric embolization with 500-700 embospheres, no significant contribution of the left bronchial artery to the left apex. IMPRESSION: Status post thoracic aortic and segmental vessel angiogram with empiric embolization of left bronchial artery and superior right bronchial artery, each contributing significant blood flow to abnormal enhancing inflammatory tissue at the left and right apex, respectively. Both artery were embolized to stasis, with no significant opacification of abnormal tissue at the completion. Status post Exoseal deployment. Signed, Dulcy Fanny. Earleen Newport, DO [facility] Radiology Electronically Signed   By: Corrie Mckusick D.O.   On: 10/13/2015 19:21   Ir Angiogram Selective Each Additional Vessel  10/13/2015  CLINICAL DATA:  39 year old male with a history of chronic pulmonary infection with MAC, as well as prior resection for Aspergilloma. He has developed hemoptysis, with significant bleeding on the prior night. Bronchoscopy could not be performed because of concerns for the patient's pulmonary status. Bilateral empiric embolization is planned. EXAM: ULTRASOUND GUIDED ACCESS RIGHT COMMON FEMORAL ARTERY THORACIC AORTIC ANGIOGRAM SELECTIVE ANGIOGRAM OF MULTIPLE BILATERAL THORACIC SEGMENTAL VESSELS INCLUDING BILATERAL BRONCHIAL ARTERIES. 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 Date:  11/24/201611/24/2016 3:52 pm FLUOROSCOPY TIME:  22 minutes, 24 seconds  MEDICATIONS AND MEDICAL HISTORY: 1.0 mg Versed, 50 mcg fentanyl ANESTHESIA/SEDATION: 73 minutes CONTRAST:  24m OMNIPAQUE IOHEXOL 300 MG/ML  SOLN COMPLICATIONS: None PROCEDURE: Informed consent was obtained from the patient  following explanation of the procedure, risks, benefits and alternatives. Specific risks include bleeding, infection, arterial injury, need for further surgery or procedure, kidney injury, contrast reaction, non targeted embolization, neurologic injury/ deficit, cardiopulmonary collapse, death The patient understands, agrees and consents for the procedure. All questions were addressed. A time out was performed. Patient is position in the supine position on the fluoroscopy table. The right inguinal region was prepped and draped in the usual sterile fashion. Maximum sterile protection was used including gown and gloves mask. 1% lidocaine was used for local anesthesia. Ultrasound survey of the right inguinal region was performed with images stored and sent to PACs. A micropuncture needle was used access the right common femoral artery under ultrasound. With excellent arterial blood flow returned, and an .018 micro wire was passed through the needle, observed enter the abdominal aorta under fluoroscopy. The needle was removed, and a micropuncture sheath was placed over the wire. The inner dilator and wire were removed, and an 035 Bentson wire was advanced under fluoroscopy into the abdominal aorta. The sheath was removed and a standard 5 [place] vascular sheath was placed. The dilator was removed and the sheath was flushed. Bentson wire was advanced to the aortic arch, and a pigtail catheter was passed over the wire to the aortic arch. Thoracic aortic angiogram was performed. Pigtail catheter was exchanged for a Mickelson catheter which was used to select multiple segmental vessels of the thoracic arch. Angiogram was performed of each vessel selected. Once the right bronchial artery was engaged,  angiogram was performed. Micro catheter system including a high-flow Renegade catheter was passed into the right bronchial artery, at a safe distance from the origin to avoid refluxing into the aorta. Empiric embolization was then performed with 500 - 700 micro meter embospheres. One vial was used. Micro catheter system was then removed, and a final angiogram was performed through the base catheter. Mickelson catheter was then used to select multiple segmental vessels searching for left bronchial artery. Exchange was made over the Bentson wire for a shunt catheter. The Chung catheter was successful with engaging the origin of left bronchial artery. Angiogram performed. Micro catheter system was then advanced into the left bronchial artery for empiric embolization. Five hundred - 700 micro meter embospheres were used. One vial used. Repeat angiogram was performed. Angiogram of the right common femoral artery was performed. Exoseal was deployed. Patient tolerated the procedure well and remained hemodynamically stable throughout. No complications were encountered and no significant blood loss encountered. FINDINGS: Thoracic aorta demonstrates no dissection flap or aneurysm. No significant atherosclerotic disease along the lumen of the aorta. Multiple segmental vessels identified including 2 right bronchial artery and single left bronchial artery from the aortic angiogram. Superior right bronchial artery angiogram: Tortuous vasculature without stenosis at the origin. Vessel is of large caliber and supplies a cluster of abnormal vessels at the apex of the lung. Inflammatory tissue at the lung apex a demonstrates dense enhancement with no early draining vein identified. No evidence of arterial contribution towards the spinal canal. Status post microsphere embolization of the right superior bronchial artery there is no significant residual blood flow. Angiogram right supreme intercostal artery angiogram: No contribution to  abnormal lung tissue. Angiogram right superior intercostal angiogram: No contribution to the lung apex. Left T9 segmental vessel angiogram: No contribution to the abnormal lung tissue. No reticular medullary vessel identified. Right inferior bronchial artery angiogram: No contribution to the abnormal vasculature at the right apex. Left T8 segmental vessel angiogram: No contribution  to abnormal left-sided lung tissue. Left bronchial artery angiogram: Circuitous vasculature with significant contribution to abnormal lung tissue at the left apex. There is dense opacification of inflammatory tissue at the left apex. No extravasation is identified. No contribution of vasculature directed towards the spinal canal. Status post empiric embolization with 500-700 embospheres, no significant contribution of the left bronchial artery to the left apex. IMPRESSION: Status post thoracic aortic and segmental vessel angiogram with empiric embolization of left bronchial artery and superior right bronchial artery, each contributing significant blood flow to abnormal enhancing inflammatory tissue at the left and right apex, respectively. Both artery were embolized to stasis, with no significant opacification of abnormal tissue at the completion. Status post Exoseal deployment. Signed, Dulcy Fanny. Earleen Newport, DO [facility] Radiology Electronically Signed   By: Corrie Mckusick D.O.   On: 10/13/2015 19:21   Ir Angiogram Selective Each Additional Vessel  10/13/2015  CLINICAL DATA:  39 year old male with a history of chronic pulmonary infection with MAC, as well as prior resection for Aspergilloma. He has developed hemoptysis, with significant bleeding on the prior night. Bronchoscopy could not be performed because of concerns for the patient's pulmonary status. Bilateral empiric embolization is planned. EXAM: ULTRASOUND GUIDED ACCESS RIGHT COMMON FEMORAL ARTERY THORACIC AORTIC ANGIOGRAM SELECTIVE  ANGIOGRAM OF MULTIPLE BILATERAL THORACIC SEGMENTAL VESSELS INCLUDING BILATERAL BRONCHIAL ARTERIES. 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 Date:  11/24/201611/24/2016 3:52 pm FLUOROSCOPY TIME:  22 minutes, 24 seconds MEDICATIONS AND MEDICAL HISTORY: 1.0 mg Versed, 50 mcg fentanyl ANESTHESIA/SEDATION: 73 minutes CONTRAST:  239m OMNIPAQUE IOHEXOL 300 MG/ML  SOLN COMPLICATIONS: None PROCEDURE: Informed consent was obtained from the patient following explanation of the procedure, risks, benefits and alternatives. Specific risks include bleeding, infection, arterial injury, need for further surgery or procedure, kidney injury, contrast reaction, non targeted embolization, neurologic injury/ deficit, cardiopulmonary collapse, death The patient understands, agrees and consents for the procedure. All questions were addressed. A time out was performed. Patient is position in the supine position on the fluoroscopy table. The right inguinal region was prepped and draped in the usual sterile fashion. Maximum sterile protection was used including gown and gloves mask. 1% lidocaine was used for local anesthesia. Ultrasound survey of the right inguinal region was performed with images stored and sent to PACs. A micropuncture needle was used access the right common femoral artery under ultrasound. With excellent arterial blood flow returned, and an .018 micro wire was passed through the needle, observed enter the abdominal aorta under fluoroscopy. The needle was removed, and a micropuncture sheath was placed over the wire. The inner dilator and wire were removed, and an 035 Bentson wire was advanced under fluoroscopy into the abdominal aorta. The sheath was removed and a standard 5 [place] sheath was placed. The dilator was removed and the sheath was flushed. Bentson wire was advanced to the  aortic arch, and a pigtail catheter was passed over the wire to the aortic arch. Thoracic aortic angiogram was performed. Pigtail catheter was exchanged for a Mickelson catheter which was used to select multiple segmental vessels of the thoracic arch. Angiogram was performed of each vessel selected. Once the right bronchial artery was engaged, angiogram was performed. Micro catheter system including a high-flow Renegade catheter was passed into the right bronchial artery, at a safe distance from the origin to avoid refluxing into the aorta. Empiric embolization was then performed with  500 - 700 micro meter embospheres. One vial was used. Micro catheter system was then removed, and a final angiogram was performed through the base catheter. Mickelson catheter was then used to select multiple segmental vessels searching for left bronchial artery. Exchange was made over the Bentson wire for a shunt catheter. The Chung catheter was successful with engaging the origin of left bronchial artery. Angiogram performed. Micro catheter system was then advanced into the left bronchial artery for empiric embolization. Five hundred - 700 micro meter embospheres were used. One vial used. Repeat angiogram was performed. Angiogram of the right common femoral artery was performed. Exoseal was deployed. Patient tolerated the procedure well and remained hemodynamically stable throughout. No complications were encountered and no significant blood loss encountered. FINDINGS: Thoracic aorta demonstrates no dissection flap or aneurysm. No significant atherosclerotic disease along the lumen of the aorta. Multiple segmental vessels identified including 2 right bronchial artery and single left bronchial artery from the aortic angiogram. Superior right bronchial artery angiogram: Tortuous vasculature without stenosis at the origin. Vessel is of large caliber and supplies a cluster of abnormal vessels at the apex of the lung. Inflammatory tissue  at the lung apex a demonstrates dense enhancement with no early draining vein identified. No evidence of arterial contribution towards the spinal canal. Status post microsphere embolization of the right superior bronchial artery there is no significant residual blood flow. Angiogram right supreme intercostal artery angiogram: No contribution to abnormal lung tissue. Angiogram right superior intercostal angiogram: No contribution to the lung apex. Left T9 segmental vessel angiogram: No contribution to the abnormal lung tissue. No reticular medullary vessel identified. Right inferior bronchial artery angiogram: No contribution to the abnormal vasculature at the right apex. Left T8 segmental vessel angiogram: No contribution to abnormal left-sided lung tissue. Left bronchial artery angiogram: Circuitous vasculature with significant contribution to abnormal lung tissue at the left apex. There is dense opacification of inflammatory tissue at the left apex. No extravasation is identified. No contribution of vasculature directed towards the spinal canal. Status post empiric embolization with 500-700 embospheres, no significant contribution of the left bronchial artery to the left apex. IMPRESSION: Status post thoracic aortic and segmental vessel angiogram with empiric embolization of left bronchial artery and superior right bronchial artery, each contributing significant blood flow to abnormal enhancing inflammatory tissue at the left and right apex, respectively. Both artery were embolized to stasis, with no significant opacification of abnormal tissue at the completion. Status post Exoseal deployment. Signed, Dulcy Fanny. Earleen Newport, DO [facility] Radiology Electronically Signed   By: Corrie Mckusick D.O.   On: 10/13/2015 19:21   Ir Angiogram Follow Up Study  10/13/2015  CLINICAL DATA:  39 year old male with a history of chronic pulmonary infection with MAC, as well as prior  resection for Aspergilloma. He has developed hemoptysis, with significant bleeding on the prior night. Bronchoscopy could not be performed because of concerns for the patient's pulmonary status. Bilateral empiric embolization is planned. EXAM: ULTRASOUND GUIDED ACCESS RIGHT COMMON FEMORAL ARTERY THORACIC AORTIC ANGIOGRAM SELECTIVE ANGIOGRAM OF MULTIPLE BILATERAL THORACIC SEGMENTAL VESSELS INCLUDING BILATERAL BRONCHIAL ARTERIES. 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 Date:  11/24/201611/24/2016 3:52 pm FLUOROSCOPY TIME:  22 minutes, 24 seconds MEDICATIONS AND MEDICAL HISTORY: 1.0 mg Versed, 50 mcg fentanyl ANESTHESIA/SEDATION: 73 minutes CONTRAST:  244m OMNIPAQUE IOHEXOL 300 MG/ML  SOLN COMPLICATIONS: None PROCEDURE: Informed consent was obtained from the  patient following explanation of the procedure, risks, benefits and alternatives. Specific risks include bleeding, infection, arterial injury, need for further surgery or procedure, kidney injury, contrast reaction, non targeted embolization, neurologic injury/ deficit, cardiopulmonary collapse, death The patient understands, agrees and consents for the procedure. All questions were addressed. A time out was performed. Patient is position in the supine position on the fluoroscopy table. The right inguinal region was prepped and draped in the usual sterile fashion. Maximum sterile protection was used including gown and gloves mask. 1% lidocaine was used for local anesthesia. Ultrasound survey of the right inguinal region was performed with images stored and sent to PACs. A micropuncture needle was used access the right common femoral artery under ultrasound. With excellent arterial blood flow returned, and an .018 micro wire was passed through the needle, observed enter the abdominal aorta under fluoroscopy. The needle was removed, and  a micropuncture sheath was placed over the wire. The inner dilator and wire were removed, and an 035 Bentson wire was advanced under fluoroscopy into the abdominal aorta. The sheath was removed and a standard 5 [place] vascular sheath was placed. The dilator was removed and the sheath was flushed. Bentson wire was advanced to the aortic arch, and a pigtail catheter was passed over the wire to the aortic arch. Thoracic aortic angiogram was performed. Pigtail catheter was exchanged for a Mickelson catheter which was used to select multiple segmental vessels of the thoracic arch. Angiogram was performed of each vessel selected. Once the right bronchial artery was engaged, angiogram was performed. Micro catheter system including a high-flow Renegade catheter was passed into the right bronchial artery, at a safe distance from the origin to avoid refluxing into the aorta. Empiric embolization was then performed with 500 - 700 micro meter embospheres. One vial was used. Micro catheter system was then removed, and a final angiogram was performed through the base catheter. Mickelson catheter was then used to select multiple segmental vessels searching for left bronchial artery. Exchange was made over the Bentson wire for a shunt catheter. The Chung catheter was successful with engaging the origin of left bronchial artery. Angiogram performed. Micro catheter system was then advanced into the left bronchial artery for empiric embolization. Five hundred - 700 micro meter embospheres were used. One vial used. Repeat angiogram was performed. Angiogram of the right common femoral artery was performed. Exoseal was deployed. Patient tolerated the procedure well and remained hemodynamically stable throughout. No complications were encountered and no significant blood loss encountered. FINDINGS: Thoracic aorta demonstrates no dissection flap or aneurysm. No significant atherosclerotic disease along the lumen of the aorta. Multiple  segmental vessels identified including 2 right bronchial artery and single left bronchial artery from the aortic angiogram. Superior right bronchial artery angiogram: Tortuous vasculature without stenosis at the origin. Vessel is of large caliber and supplies a cluster of abnormal vessels at the apex of the lung. Inflammatory tissue at the lung apex a demonstrates dense enhancement with no early draining vein identified. No evidence of arterial contribution towards the spinal canal. Status post microsphere embolization of the right superior bronchial artery there is no significant residual blood flow. Angiogram right supreme intercostal artery angiogram: No contribution to abnormal lung tissue. Angiogram right superior intercostal angiogram: No contribution to the lung apex. Left T9 segmental vessel angiogram: No contribution to the abnormal lung tissue. No reticular medullary vessel identified. Right inferior bronchial artery angiogram: No contribution to the abnormal vasculature at the right apex. Left T8 segmental vessel angiogram: No  contribution to abnormal left-sided lung tissue. Left bronchial artery angiogram: Circuitous vasculature with significant contribution to abnormal lung tissue at the left apex. There is dense opacification of inflammatory tissue at the left apex. No extravasation is identified. No contribution of vasculature directed towards the spinal canal. Status post empiric embolization with 500-700 embospheres, no significant contribution of the left bronchial artery to the left apex. IMPRESSION: Status post thoracic aortic and segmental vessel angiogram with empiric embolization of left bronchial artery and superior right bronchial artery, each contributing significant blood flow to abnormal enhancing inflammatory tissue at the left and right apex, respectively. Both artery were embolized to stasis, with no significant opacification of abnormal tissue at the completion. Status post Exoseal  deployment. Signed, Dulcy Fanny. Earleen Newport, DO [facility] Radiology Electronically Signed   By: Corrie Mckusick D.O.   On: 10/13/2015 19:21   Ir Angiogram Follow Up Study  10/13/2015  CLINICAL DATA:  39 year old male with a history of chronic pulmonary infection with MAC, as well as prior resection for Aspergilloma. He has developed hemoptysis, with significant bleeding on the prior night. Bronchoscopy could not be performed because of concerns for the patient's pulmonary status. Bilateral empiric embolization is planned. EXAM: ULTRASOUND GUIDED ACCESS RIGHT COMMON FEMORAL ARTERY THORACIC AORTIC ANGIOGRAM SELECTIVE ANGIOGRAM OF MULTIPLE BILATERAL THORACIC SEGMENTAL VESSELS INCLUDING BILATERAL BRONCHIAL ARTERIES. 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 Date:  11/24/201611/24/2016 3:52 pm FLUOROSCOPY TIME:  22 minutes, 24 seconds MEDICATIONS AND MEDICAL HISTORY: 1.0 mg Versed, 50 mcg fentanyl ANESTHESIA/SEDATION: 73 minutes CONTRAST:  220m OMNIPAQUE IOHEXOL 300 MG/ML  SOLN COMPLICATIONS: None PROCEDURE: Informed consent was obtained from the patient following explanation of the procedure, risks, benefits and alternatives. Specific risks include bleeding, infection, arterial injury, need for further surgery or procedure, kidney injury, contrast reaction, non targeted embolization, neurologic injury/ deficit, cardiopulmonary collapse, death The patient understands, agrees and consents for the procedure. All questions were addressed. A time out was performed. Patient is position in the supine position on the fluoroscopy table. The right inguinal region was prepped and draped in the usual sterile fashion. Maximum sterile protection was used including gown and gloves mask. 1% lidocaine was used for local anesthesia. Ultrasound survey of the right  inguinal region was performed with images stored and sent to PACs. A micropuncture needle was used access the right common femoral artery under ultrasound. With excellent arterial blood flow returned, and an .018 micro wire was passed through the needle, observed enter the abdominal aorta under fluoroscopy. The needle was removed, and a micropuncture sheath was placed over the wire. The inner dilator and wire were removed, and an 035 Bentson wire was advanced under fluoroscopy into the abdominal aorta. The sheath was removed and a standard 5 [place] sheath was placed. The dilator was removed and the sheath was flushed. Bentson wire was advanced to the aortic arch, and a pigtail catheter was passed over the wire to the aortic arch. Thoracic aortic angiogram was performed. Pigtail catheter was exchanged for a Mickelson catheter which was used to select multiple segmental vessels of the thoracic arch. Angiogram was performed of each vessel selected. Once the right bronchial artery was engaged, angiogram was performed. Micro catheter system including a high-flow Renegade catheter was passed into the right bronchial artery, at a safe distance from the origin to avoid refluxing into the aorta. Empiric embolization was then performed with  500 - 700 micro meter embospheres. One vial was used. Micro catheter system was then removed, and a final angiogram was performed through the base catheter. Mickelson catheter was then used to select multiple segmental vessels searching for left bronchial artery. Exchange was made over the Bentson wire for a shunt catheter. The Chung catheter was successful with engaging the origin of left bronchial artery. Angiogram performed. Micro catheter system was then advanced into the left bronchial artery for empiric embolization. Five hundred - 700 micro meter embospheres were used. One vial used. Repeat angiogram was performed. Angiogram of the right common femoral artery was performed.  Exoseal was deployed. Patient tolerated the procedure well and remained hemodynamically stable throughout. No complications were encountered and no significant blood loss encountered. FINDINGS: Thoracic aorta demonstrates no dissection flap or aneurysm. No significant atherosclerotic disease along the lumen of the aorta. Multiple segmental vessels identified including 2 right bronchial artery and single left bronchial artery from the aortic angiogram. Superior right bronchial artery angiogram: Tortuous vasculature without stenosis at the origin. Vessel is of large caliber and supplies a cluster of abnormal vessels at the apex of the lung. Inflammatory tissue at the lung apex a demonstrates dense enhancement with no early draining vein identified. No evidence of arterial contribution towards the spinal canal. Status post microsphere embolization of the right superior bronchial artery there is no significant residual blood flow. Angiogram right supreme intercostal artery angiogram: No contribution to abnormal lung tissue. Angiogram right superior intercostal angiogram: No contribution to the lung apex. Left T9 segmental vessel angiogram: No contribution to the abnormal lung tissue. No reticular medullary vessel identified. Right inferior bronchial artery angiogram: No contribution to the abnormal vasculature at the right apex. Left T8 segmental vessel angiogram: No contribution to abnormal left-sided lung tissue. Left bronchial artery angiogram: Circuitous vasculature with significant contribution to abnormal lung tissue at the left apex. There is dense opacification of inflammatory tissue at the left apex. No extravasation is identified. No contribution of vasculature directed towards the spinal canal. Status post empiric embolization with 500-700 embospheres, no significant contribution of the left bronchial artery to the left apex. IMPRESSION: Status post thoracic aortic and segmental vessel angiogram with empiric  embolization of left bronchial artery and superior right bronchial artery, each contributing significant blood flow to abnormal enhancing inflammatory tissue at the left and right apex, respectively. Both artery were embolized to stasis, with no significant opacification of abnormal tissue at the completion. Status post Exoseal deployment. Signed, Dulcy Fanny. Earleen Newport, DO [facility] Radiology Electronically Signed   By: Corrie Mckusick D.O.   On: 10/13/2015 19:21   Ir US Guide Vasc Access Right  10/13/2015  CLINICAL DATA:  39 year old male with a history of chronic pulmonary infection with MAC, as well as prior resection for Aspergilloma. He has developed hemoptysis, with significant bleeding on the prior night. Bronchoscopy could not be performed because of concerns for the patient's pulmonary status. Bilateral empiric embolization is planned. EXAM: ULTRASOUND GUIDED ACCESS RIGHT COMMON FEMORAL ARTERY THORACIC AORTIC ANGIOGRAM SELECTIVE ANGIOGRAM OF MULTIPLE BILATERAL THORACIC SEGMENTAL VESSELS INCLUDING BILATERAL BRONCHIAL ARTERIES. 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 Date:  11/24/201611/24/2016 3:52 pm FLUOROSCOPY TIME:  22 minutes, 24 seconds MEDICATIONS AND MEDICAL HISTORY: 1.0 mg Versed, 50 mcg fentanyl ANESTHESIA/SEDATION: 73 minutes CONTRAST:  246m OMNIPAQUE IOHEXOL 300 MG/ML  SOLN COMPLICATIONS: None PROCEDURE: Informed consent was obtained from  the patient following explanation of the procedure, risks, benefits and alternatives. Specific risks include bleeding, infection, arterial injury, need for further surgery or procedure, kidney injury, contrast reaction, non targeted embolization, neurologic injury/ deficit, cardiopulmonary collapse, death The patient understands, agrees and consents for the procedure. All questions  were addressed. A time out was performed. Patient is position in the supine position on the fluoroscopy table. The right inguinal region was prepped and draped in the usual sterile fashion. Maximum sterile protection was used including gown and gloves mask. 1% lidocaine was used for local anesthesia. Ultrasound survey of the right inguinal region was performed with images stored and sent to PACs. A micropuncture needle was used access the right common femoral artery under ultrasound. With excellent arterial blood flow returned, and an .018 micro wire was passed through the needle, observed enter the abdominal aorta under fluoroscopy. The needle was removed, and a micropuncture sheath was placed over the wire. The inner dilator and wire were removed, and an 035 Bentson wire was advanced under fluoroscopy into the abdominal aorta. The sheath was removed and a standard 5 [place] vascular sheath was placed. The dilator was removed and the sheath was flushed. Bentson wire was advanced to the aortic arch, and a pigtail catheter was passed over the wire to the aortic arch. Thoracic aortic angiogram was performed. Pigtail catheter was exchanged for a Mickelson catheter which was used to select multiple segmental vessels of the thoracic arch. Angiogram was performed of each vessel selected. Once the right bronchial artery was engaged, angiogram was performed. Micro catheter system including a high-flow Renegade catheter was passed into the right bronchial artery, at a safe distance from the origin to avoid refluxing into the aorta. Empiric embolization was then performed with 500 - 700 micro meter embospheres. One vial was used. Micro catheter system was then removed, and a final angiogram was performed through the base catheter. Mickelson catheter was then used to select multiple segmental vessels searching for left bronchial artery. Exchange was made over the Bentson wire for a shunt catheter. The Chung catheter was  successful with engaging the origin of left bronchial artery. Angiogram performed. Micro catheter system was then advanced into the left bronchial artery for empiric embolization. Five hundred - 700 micro meter embospheres were used. One vial used. Repeat angiogram was performed. Angiogram of the right common femoral artery was performed. Exoseal was deployed. Patient tolerated the procedure well and remained hemodynamically stable throughout. No complications were encountered and no significant blood loss encountered. FINDINGS: Thoracic aorta demonstrates no dissection flap or aneurysm. No significant atherosclerotic disease along the lumen of the aorta. Multiple segmental vessels identified including 2 right bronchial artery and single left bronchial artery from the aortic angiogram. Superior right bronchial artery angiogram: Tortuous vasculature without stenosis at the origin. Vessel is of large caliber and supplies a cluster of abnormal vessels at the apex of the lung. Inflammatory tissue at the lung apex a demonstrates dense enhancement with no early draining vein identified. No evidence of arterial contribution towards the spinal canal. Status post microsphere embolization of the right superior bronchial artery there is no significant residual blood flow. Angiogram right supreme intercostal artery angiogram: No contribution to abnormal lung tissue. Angiogram right superior intercostal angiogram: No contribution to the lung apex. Left T9 segmental vessel angiogram: No contribution to the abnormal lung tissue. No reticular medullary vessel identified. Right inferior bronchial artery angiogram: No contribution to the abnormal vasculature at the right apex. Left T8 segmental vessel angiogram:  No contribution to abnormal left-sided lung tissue. Left bronchial artery angiogram: Circuitous vasculature with significant contribution to abnormal lung tissue at the left apex. There is dense opacification of inflammatory  tissue at the left apex. No extravasation is identified. No contribution of vasculature directed towards the spinal canal. Status post empiric embolization with 500-700 embospheres, no significant contribution of the left bronchial artery to the left apex. IMPRESSION: Status post thoracic aortic and segmental vessel angiogram with empiric embolization of left bronchial artery and superior right bronchial artery, each contributing significant blood flow to abnormal enhancing inflammatory tissue at the left and right apex, respectively. Both artery were embolized to stasis, with no significant opacification of abnormal tissue at the completion. Status post Exoseal deployment. Signed, Dulcy Fanny. Earleen Newport, DO [facility] Radiology Electronically Signed   By: Corrie Mckusick D.O.   On: 10/13/2015 19:21   Dg Chest Port 1 View  10/14/2015  CLINICAL DATA:  Hemoptysis, mycobacterium avium intracellular complex. EXAM: PORTABLE CHEST 1 VIEW COMPARISON:  October 13, 2015. FINDINGS: Stable cardiomediastinal silhouette. Bilateral upper lobe bulla are again noted and unchanged. Associated scarring in the both upper lobes is noted. Opacity is again noted in the left upper lobe most likely representing fungus ball. No significant pleural effusion is noted. Bony thorax is unremarkable. IMPRESSION: Stable bilateral bulla formation and scarring is noted in both upper lobes. Soft tissue density is again noted in left upper lobe most likely representing fungus ball as described on prior CT scan. No significant changes noted compared to prior exam. Electronically Signed   By: Marijo Conception, M.D.   On: 10/14/2015 07:41   Dg Chest Portable 1 View  10/13/2015  CLINICAL DATA:  Acute onset of hemoptysis and shortness of breath. Initial encounter. EXAM: PORTABLE CHEST 1 VIEW COMPARISON:  Chest radiograph performed 09/28/2014 FINDINGS: There appears to be an enlarging 3.9 cm mass near the  left lung apex. Biapical pleural parenchymal scarring and bullous change are again seen, with somewhat worsening bilateral pulmonary nodularity. Pleural thickening near the left lung mass appears worsened, raising concern for local spread of disease. No pleural effusion or pneumothorax is seen. The cardiomediastinal silhouette is normal in size. No acute osseous abnormalities are identified. A chronic left fourth rib deformity is noted. IMPRESSION: 1. Apparent enlarging 3.9 cm mass near the left lung apex, concerning for malignancy. Pleural thickening near the left lung mass appears worsened, raising concern for local spread of disease. CT of the chest would be helpful for further evaluation, when and as deemed clinically appropriate. 2. Somewhat worsening bilateral pulmonary nodularity noted. Biapical pleural parenchymal scarring and bullous change again noted. Electronically Signed   By: Garald Balding M.D.   On: 10/13/2015 05:22   [place] Guide Roadmapping  10/13/2015  CLINICAL DATA:  39 year old male with a history of chronic pulmonary infection with MAC, as well as prior resection for Aspergilloma. He has developed hemoptysis, with significant bleeding on the prior night. Bronchoscopy could not be performed because of concerns for the patient's pulmonary status. Bilateral empiric embolization is planned. EXAM: ULTRASOUND GUIDED ACCESS RIGHT COMMON FEMORAL ARTERY THORACIC AORTIC ANGIOGRAM SELECTIVE ANGIOGRAM OF MULTIPLE BILATERAL THORACIC SEGMENTAL VESSELS INCLUDING BILATERAL BRONCHIAL ARTERIES. 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 Date:  11/24/201611/24/2016 3:52 pm FLUOROSCOPY TIME:  22 minutes, 24 seconds MEDICATIONS AND MEDICAL HISTORY: 1.0  mg Versed, 50 mcg fentanyl ANESTHESIA/SEDATION: 73 minutes CONTRAST:  242m OMNIPAQUE IOHEXOL 300  MG/ML  SOLN COMPLICATIONS: None PROCEDURE: Informed consent was obtained from the patient following explanation of the procedure, risks, benefits and alternatives. Specific risks include bleeding, infection, arterial injury, need for further surgery or procedure, kidney injury, contrast reaction, non targeted embolization, neurologic injury/ deficit, cardiopulmonary collapse, death The patient understands, agrees and consents for the procedure. All questions were addressed. A time out was performed. Patient is position in the supine position on the fluoroscopy table. The right inguinal region was prepped and draped in the usual sterile fashion. Maximum sterile protection was used including gown and gloves mask. 1% lidocaine was used for local anesthesia. Ultrasound survey of the right inguinal region was performed with images stored and sent to PACs. A micropuncture needle was used access the right common femoral artery under ultrasound. With excellent arterial blood flow returned, and an .018 micro wire was passed through the needle, observed enter the abdominal aorta under fluoroscopy. The needle was removed, and a micropuncture sheath was placed over the wire. The inner dilator and wire were removed, and an 035 Bentson wire was advanced under fluoroscopy into the abdominal aorta. The sheath was removed and a standard 5 [place] sheath was placed. The dilator was removed and the sheath was flushed. Bentson wire was advanced to the aortic arch, and a pigtail catheter was passed over the wire to the aortic arch. Thoracic aortic angiogram was performed. Pigtail catheter was exchanged for a Mickelson catheter which was used to select multiple segmental vessels of the thoracic arch. Angiogram was performed of each vessel selected. Once the right bronchial artery was engaged, angiogram was performed. Micro catheter system including a high-flow Renegade catheter was passed into the right bronchial artery, at a  safe distance from the origin to avoid refluxing into the aorta. Empiric embolization was then performed with 500 - 700 micro meter embospheres. One vial was used. Micro catheter system was then removed, and a final angiogram was performed through the base catheter. Mickelson catheter was then used to select multiple segmental vessels searching for left bronchial artery. Exchange was made over the Bentson wire for a shunt catheter. The Chung catheter was successful with engaging the origin of left bronchial artery. Angiogram performed. Micro catheter system was then advanced into the left bronchial artery for empiric embolization. Five hundred - 700 micro meter embospheres were used. One vial used. Repeat angiogram was performed. Angiogram of the right common femoral artery was performed. Exoseal was deployed. Patient tolerated the procedure well and remained hemodynamically stable throughout. No complications were encountered and no significant blood loss encountered. FINDINGS: Thoracic aorta demonstrates no dissection flap or aneurysm. No significant atherosclerotic disease along the lumen of the aorta. Multiple segmental vessels identified including 2 right bronchial artery and single left bronchial artery from the aortic angiogram. Superior right bronchial artery angiogram: Tortuous vasculature without stenosis at the origin. Vessel is of large caliber and supplies a cluster of abnormal vessels at the apex of the lung. Inflammatory tissue at the lung apex a demonstrates dense enhancement with no early draining vein identified. No evidence of arterial contribution towards the spinal canal. Status post microsphere embolization of the right superior bronchial artery there is no significant residual blood flow. Angiogram right supreme intercostal artery angiogram: No contribution to abnormal lung tissue. Angiogram right superior intercostal angiogram: No contribution to the lung apex. Left T9 segmental vessel  angiogram: No contribution to the abnormal lung  tissue. No reticular medullary vessel identified. Right inferior bronchial artery angiogram: No contribution to the abnormal vasculature at the right apex. Left T8 segmental vessel angiogram: No contribution to abnormal left-sided lung tissue. Left bronchial artery angiogram: Circuitous vasculature with significant contribution to abnormal lung tissue at the left apex. There is dense opacification of inflammatory tissue at the left apex. No extravasation is identified. No contribution of vasculature directed towards the spinal canal. Status post empiric embolization with 500-700 embospheres, no significant contribution of the left bronchial artery to the left apex. IMPRESSION: Status post thoracic aortic and segmental vessel angiogram with empiric embolization of left bronchial artery and superior right bronchial artery, each contributing significant blood flow to abnormal enhancing inflammatory tissue at the left and right apex, respectively. Both artery were embolized to stasis, with no significant opacification of abnormal tissue at the completion. Status post Exoseal deployment. Signed, Dulcy Fanny. Earleen Newport, DO [facility] Radiology Electronically Signed   By: Corrie Mckusick D.O.   On: 10/13/2015 19:21   [place] Guide Roadmapping  10/13/2015  CLINICAL DATA:  39 year old male with a history of chronic pulmonary infection with MAC, as well as prior resection for Aspergilloma. He has developed hemoptysis, with significant bleeding on the prior night. Bronchoscopy could not be performed because of concerns for the patient's pulmonary status. Bilateral empiric embolization is planned. EXAM: ULTRASOUND GUIDED ACCESS RIGHT COMMON FEMORAL ARTERY THORACIC AORTIC ANGIOGRAM SELECTIVE ANGIOGRAM OF MULTIPLE BILATERAL THORACIC SEGMENTAL VESSELS INCLUDING BILATERAL BRONCHIAL ARTERIES. 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 Date:  11/24/201611/24/2016 3:52 pm FLUOROSCOPY TIME:  22 minutes, 24 seconds MEDICATIONS AND MEDICAL HISTORY: 1.0 mg Versed, 50 mcg fentanyl ANESTHESIA/SEDATION: 73 minutes CONTRAST:  240m OMNIPAQUE IOHEXOL 300 MG/ML  SOLN COMPLICATIONS: None PROCEDURE: Informed consent was obtained from the patient following explanation of the procedure, risks, benefits and alternatives. Specific risks include bleeding, infection, arterial injury, need for further surgery or procedure, kidney injury, contrast reaction, non targeted embolization, neurologic injury/ deficit, cardiopulmonary collapse, death The patient understands, agrees and consents for the procedure. All questions were addressed. A time out was performed. Patient is position in the supine position on the fluoroscopy table. The right inguinal region was prepped and draped in the usual sterile fashion. Maximum sterile protection was used including gown and gloves mask. 1% lidocaine was used for local anesthesia. Ultrasound survey of the right inguinal region was performed with images stored and sent to PACs. A micropuncture needle was used access the right common femoral artery under ultrasound. With excellent arterial blood flow returned, and an .018 micro wire was passed through the needle, observed enter the abdominal aorta under fluoroscopy. The needle was removed, and a micropuncture sheath was placed over the wire. The inner dilator and wire were removed, and an 035 Bentson wire was advanced under fluoroscopy into the abdominal aorta. The sheath was removed and a standard 5 [place] sheath was placed. The dilator was removed and the sheath was flushed. Bentson wire was advanced to the aortic arch, and a pigtail catheter was passed over the wire to the aortic arch. Thoracic aortic angiogram  was performed. Pigtail catheter was exchanged for a Mickelson catheter which was used to select multiple segmental vessels of the thoracic arch. Angiogram was performed of each vessel selected. Once the right bronchial artery was engaged, angiogram was performed.  Micro catheter system including a high-flow Renegade catheter was passed into the right bronchial artery, at a safe distance from the origin to avoid refluxing into the aorta. Empiric embolization was then performed with 500 - 700 micro meter embospheres. One vial was used. Micro catheter system was then removed, and a final angiogram was performed through the base catheter. Mickelson catheter was then used to select multiple segmental vessels searching for left bronchial artery. Exchange was made over the Bentson wire for a shunt catheter. The Chung catheter was successful with engaging the origin of left bronchial artery. Angiogram performed. Micro catheter system was then advanced into the left bronchial artery for empiric embolization. Five hundred - 700 micro meter embospheres were used. One vial used. Repeat angiogram was performed. Angiogram of the right common femoral artery was performed. Exoseal was deployed. Patient tolerated the procedure well and remained hemodynamically stable throughout. No complications were encountered and no significant blood loss encountered. FINDINGS: Thoracic aorta demonstrates no dissection flap or aneurysm. No significant atherosclerotic disease along the lumen of the aorta. Multiple segmental vessels identified including 2 right bronchial artery and single left bronchial artery from the aortic angiogram. Superior right bronchial artery angiogram: Tortuous vasculature without stenosis at the origin. Vessel is of large caliber and supplies a cluster of abnormal vessels at the apex of the lung. Inflammatory tissue at the lung apex a demonstrates dense enhancement with no early draining vein identified. No evidence of  arterial contribution towards the spinal canal. Status post microsphere embolization of the right superior bronchial artery there is no significant residual blood flow. Angiogram right supreme intercostal artery angiogram: No contribution to abnormal lung tissue. Angiogram right superior intercostal angiogram: No contribution to the lung apex. Left T9 segmental vessel angiogram: No contribution to the abnormal lung tissue. No reticular medullary vessel identified. Right inferior bronchial artery angiogram: No contribution to the abnormal vasculature at the right apex. Left T8 segmental vessel angiogram: No contribution to abnormal left-sided lung tissue. Left bronchial artery angiogram: Circuitous vasculature with significant contribution to abnormal lung tissue at the left apex. There is dense opacification of inflammatory tissue at the left apex. No extravasation is identified. No contribution of vasculature directed towards the spinal canal. Status post empiric embolization with 500-700 embospheres, no significant contribution of the left bronchial artery to the left apex. IMPRESSION: Status post thoracic aortic and segmental vessel angiogram with empiric embolization of left bronchial artery and superior right bronchial artery, each contributing significant blood flow to abnormal enhancing inflammatory tissue at the left and right apex, respectively. Both artery were embolized to stasis, with no significant opacification of abnormal tissue at the completion. Status post Exoseal deployment. Signed, Dulcy Fanny. Earleen Newport, DO [facility] Radiology Electronically Signed   By: Corrie Mckusick D.O.   On: 10/13/2015 19:21    Oren Binet, MD  [facility] Pager:[phone]  If 7PM-7AM, please contact night-coverage [email or web address] Password [email or web address] 10/15/2015, 4:07 PM   LOS: 2 days

## 2015-10-15 NOTE — Progress Notes (Signed)
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    11/24- Empiric embolization of bilateral bronchial arteries to stasis with 500-700 embospheres. , avoided ett and bronch as risk to not come off vent and ptx  SUBJECTIVE: Aware fever, no chills. Cough only specks blood.  VITAL SIGNS: BP 115/71 mmHg  Pulse 93  Temp(Src) 101.3 F (38.5 C) (Oral)  Resp 19  Ht _0  (1.753 m)  Wt 133 lb 9.6 oz (60.6 kg)  BMI 19.72 kg/m2  SpO2 97%  HEMODYNAMICS:    VENTILATOR SETTINGS:    INTAKE / OUTPUT: I/O last 3 completed shifts: In: 4089 [P.O.:480; I.V.:3609] Out: 1350 [Urine:1350]  PHYSICAL EXAMINATION: General:  Thin adult male in NAD  Neuro:  AAOx4, speech clear, alert, conversant, asking appropriate questions HEENT:  wnl JVD  Cardiovascular:  s1s2 rrr, no m/r/g Lungs:  Unlabored, few bilateral crackles, not coughing during my exam Abdomen:  NTND, bsx4 active  Musculoskeletal:  No acute deformities  Skin:  Warm/dry, ruddy coloring, multiple tattoos, no rashes or lesions   LABS:  CBC  Recent Labs Lab 10/13/15 0400 10/13/15 0420 10/14/15 0228 10/15/15 0444  WBC 9.7  --  19.0* 12.6*  HGB 15.4 16.0 14.6 13.5  HCT 44.9 47.0 42.9 39.6  PLT 237  --  203 166   Coag's  Recent Labs Lab 10/13/15 0400  APTT 31  INR 0.95   BMET  Recent Labs Lab 10/13/15 0400 10/13/15 0420 10/14/15 0228 10/15/15 0444  NA 139 137 133* 136  K 3.8 3.8 3.6 4.0  CL 104  108 101 105  CO2 28  --  25 24  BUN _1 CREATININE 0.93 0.90 1.00 0.91  GLUCOSE 136* 129* 97 104*   Electrolytes  Recent Labs Lab 10/13/15 0400 10/14/15 0228 10/15/15 0444  CALCIUM 8.4* 8.0* 7.9*   Sepsis Markers  Recent Labs Lab 10/13/15 0416  LATICACIDVEN 1.44   ABG No results for input(s): PHART, PCO2ART, PO2ART in the last 168 hours. Liver Enzymes  Recent Labs Lab 10/13/15 0400  AST 24  ALT 14*  ALKPHOS 96  BILITOT 1.1  ALBUMIN 3.5   Cardiac Enzymes No results for input(s): TROPONINI, PROBNP in the last 168 hours. Glucose  Recent Labs Lab 10/13/15 0932  GLUCAP 78    Imaging    STUDIES:  11/24  CTA Chest >> no PE, new dense layering airspace in L apical bulla concerning for fungal ball, numerous scattered nodules, loculated L sided pneumothorax decreased in size, small R basilar pneumothorax, and large bilateral bullae.   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  S/p bronchial artery embolization for hemoptysis 11/25.   ASSESSMENT / PLAN:  PULMONARY A: Hemoptysis - acute episode 11/24 am, approx 100-200 ml . Bleeding almost stopped S/p  embolization Bronchial arteries bilateral 11/25- temp and WBC up on 11/26 Acute Hypoxemia - in setting of hemoptysis  Concern for new Fungal Ball - see 11/24 CT ABPA - on chronic prednisone  MAC - on azithro / ethambutol P:   Pulmonary hygiene Continue chronic medications - azithromycin, ethambutol, prednisone  Intermittent CXR , follow clinically for  bleeding Budesonide / Pulmicort  Spiriva  Q6 PRN Albuterol  Sputum for AFB x 3 -pending Watch temp and WBC   CARDIOVASCULAR A:  No acute issues P:  Dc tele  RENAL A:   S/p contrast bilateral BA at risk ATN  P:   Trend BMP / UOP  NS @ 75 ml/hr, maintain No lasix  GASTROINTESTINAL A:   Protein Calorie Malnutrition  Weight Loss - approx 10 lbs since 08/2015 P:   Regular dietnow Ensure   HEMATOLOGIC A:   Hemoptyisis, DV tprev P:  Continue home ferrous sulfate  Monitor CBC  SCD's for DVT prophylaxis   INFECTIOUS A:   Concern for new aspergilloma  Hx MAC, ABPA  unlikely TB P:   Follow cultures as above  afb pending  ENDOCRINE A:   Hypothyroidism  P:   Continue synthroid  Assess TSH  - 4  NEUROLOGIC A:   No acute issues  P:   Monitor pain   FAMILY  - Inter-disciplinary family meet or Palliative Care meeting due by:   12/2   Bartholomew Crews, [place] [phone] [facility] 10/15/2015 8:22 AM

## 2015-10-15 NOTE — Progress Notes (Signed)
Pt has taken azithromycin with no reactions noted. Pt allergic to clarithromycin

## 2015-10-16 LAB — CULTURE, RESPIRATORY

## 2015-10-16 LAB — CULTURE, RESPIRATORY W GRAM STAIN: Culture: NORMAL

## 2015-10-16 MED ORDER — SODIUM CHLORIDE 3 % IN NEBU
5.0000 mL | INHALATION_SOLUTION | Freq: Every day | RESPIRATORY_TRACT | Status: DC
  Filled 2015-10-16 (×2): qty 8

## 2015-10-16 MED ORDER — SODIUM CHLORIDE 3 % IN NEBU
4.0000 mL | INHALATION_SOLUTION | Freq: Every day | RESPIRATORY_TRACT | Status: AC
  Administered 2015-10-16 (×2): 4 mL via RESPIRATORY_TRACT
  Filled 2015-10-16 (×2): qty 4

## 2015-10-16 NOTE — Progress Notes (Signed)
ANTIBIOTIC CONSULT NOTE  Pharmacy Consult for voriconazole PO (lifelong pta) Indication: aspergilloma hx  Allergies  Allergen Reactions  . Clarithromycin [Clarithromycin]     Adverse reaction w/ voriconazole  . Rifaximin [Rifaximin]     Adverse reaction w/ voriconazole    Patient Measurements: Height: _0  (175.3 cm) Weight: 133 lb 9.6 oz (60.6 kg) IBW/kg (Calculated) : 70.7  Vital Signs: Temp: 98 F (36.7 C) (11/27 0612) Temp Source: Oral (11/27 0612) BP: 120/73 mmHg (11/27 0612) Pulse Rate: 91 (11/27 0843) Intake/Output from previous day: 11/26 0701 - 11/27 0700 In: 2214 [P.O.:1160; I.V.:1054] Out: 400 [Urine:400] Intake/Output from this shift:    Labs:  Recent Labs  10/14/15 0228 10/15/15 0444  WBC 19.0* 12.6*  HGB 14.6 13.5  PLT 203 166  CREATININE 1.00 0.91   Estimated Creatinine Clearance: 91.6 mL/min (by C-G formula based on Cr of 0.91). No results for input(s): VANCOTROUGH, VANCOPEAK, VANCORANDOM, GENTTROUGH, GENTPEAK, GENTRANDOM, TOBRATROUGH, TOBRAPEAK, TOBRARND, AMIKACINPEAK, AMIKACINTROU, AMIKACIN in the last 72 hours.   Microbiology: Recent Results (from the past 720 hour(s))  Blood culture (routine x 2)     Status: None (Preliminary result)   Collection Time: 10/13/15  4:30 AM  Result Value Ref Range Status   Specimen Description BLOOD RIGHT ANTECUBITAL  Final   Special Requests BOTTLES DRAWN AEROBIC AND ANAEROBIC 5CC  Final   Culture   Final    NO GROWTH 3 DAYS Performed at [facility]    Report Status PENDING  Incomplete  Blood culture (routine x 2)     Status: None (Preliminary result)   Collection Time: 10/13/15  4:35 AM  Result Value Ref Range Status   Specimen Description BLOOD BLOOD RIGHT FOREARM  Final   Special Requests BOTTLES DRAWN AEROBIC AND ANAEROBIC 5CC  Final   Culture   Final    NO GROWTH 3 DAYS Performed at [facility]    Report Status PENDING  Incomplete  MRSA PCR Screening     Status: None   Collection Time: 10/13/15  9:15 AM  Result Value Ref Range Status   MRSA by PCR NEGATIVE NEGATIVE Final    Comment:        The GeneXpert MRSA Assay (FDA approved for NASAL specimens only), is one component of a comprehensive MRSA colonization surveillance program. It is not intended to diagnose MRSA infection nor to guide or monitor treatment for MRSA infections.   AFB culture with smear     Status: None (Preliminary result)   Collection Time: 10/13/15 12:00 PM  Result Value Ref Range Status   Specimen Description SPUTUM  Final   Special Requests Immunocompromised  Final   Acid Fast Smear   Final    NO ACID FAST BACILLI SEEN Performed at [facility]    Culture   Final    CULTURE WILL BE EXAMINED FOR 6 WEEKS BEFORE ISSUING A FINAL REPORT Performed at [facility]    Report Status PENDING  Incomplete  Culture, expectorated sputum-assessment     Status: None   Collection Time: 10/13/15 12:00 PM  Result Value Ref Range Status   Specimen Description EXPECTORATED SPUTUM  Final   Special Requests Immunocompromised  Final   Sputum evaluation   Final    THIS SPECIMEN IS ACCEPTABLE. RESPIRATORY CULTURE REPORT TO FOLLOW.   Report Status 10/13/2015 FINAL  Final  Culture, respiratory (NON-Expectorated)     Status: None (Preliminary result)   Collection Time: 10/13/15 12:00 PM  Result Value Ref Range  Status   Specimen Description SPUTUM  Final   Special Requests NONE  Final   Gram Stain   Final    ABUNDANT WBC PRESENT,BOTH PMN AND MONONUCLEAR RARE SQUAMOUS EPITHELIAL CELLS PRESENT MODERATE GRAM POSITIVE COCCI IN PAIRS IN CHAINS IN CLUSTERS Performed at [facility]    Culture   Final    Culture reincubated for better growth Performed at [facility]    Report Status PENDING  Incomplete  Culture, expectorated sputum-assessment     Status: None   Collection Time: 10/15/15  2:26 AM  Result Value Ref Range Status   Specimen Description SPUTUM   Final   Special Requests Normal  Final   Sputum evaluation   Final    THIS SPECIMEN IS ACCEPTABLE. RESPIRATORY CULTURE REPORT TO FOLLOW.   Report Status 10/15/2015 FINAL  Final  Culture, respiratory (NON-Expectorated)     Status: None (Preliminary result)   Collection Time: 10/15/15  2:26 AM  Result Value Ref Range Status   Specimen Description EXPECTORATED SPUTUM  Final   Special Requests NONE  Final   Gram Stain   Final    RARE WBC PRESENT, PREDOMINANTLY PMN RARE SQUAMOUS EPITHELIAL CELLS PRESENT MODERATE GRAM POSITIVE COCCI IN PAIRS AND CHAINS Performed at [facility]    Culture PENDING  Incomplete   Report Status PENDING  Incomplete   Assessment: 35 yom on voriconazole pta for aspergilloma s/p VATS in 2012. Pharmacy consulted to continue voriconazole.   Pt followed at [facility] by ID pharmacist- previously supratherapeutic on higher dose previously, therefore kept on PO home dose of 351m PO q12h. Will be on med life-long.   CXR - L apical mass concerning for new fungal ball per CCM, doubt TB.  S/p bilateral bronchial artery embolization on 11/24. Some specks of blood noted in sputum per yesterday's notes.   11/24 BCx2: ngtd 11/24 Sputum: reincubated 11/26 sputum: GPC  Goal of Therapy:  Adequate therapy  Plan:  -Voriconazole 3018mPO BID -F/u temp curve, wbc trend, c/s, clinical progress -continue ethambutol 100081m azithromycin 500m55mily as from PTA for MAC  Jordane Hisle D. Ashford Clouse, PharmD, BCPS Clinical Pharmacist Pager: [phone] 10:53 AM

## 2015-10-16 NOTE — Progress Notes (Signed)
PATIENT DETAILS Name: [name] Age: 39 y.o. 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 CT angiogram of the chest was positive for aspergilloma in the left upper lung bulla. Felt to be high risk for pneumothorax/VDRF if bronchoscopy performed-hence IR was consulted for embolization-no further hemoptysis since then. Transferred to hospitalists service on 11/26.  Subjective:  In bed, denies any fever or chills, no chest or abdominal pain. No further hemoptysis.  Assessment/Plan:  Hemoptysis: Felt to be secondary to aspergillosis (fungal ball seen on CT chest 11/24). Felt to be high risk for pneumothorax/VDRF if underwent bronchoscopy-hence IR consulted and patient underwent empiric embolization of bilateral bronchial arteries. No further recurrence of hemoptysis. Sputum AFB 1 negative-but 3 needed prior to discontinuing isolation. Continue voriconazole (taking prior to admission-on lifelong voriconazole). PCCM following-await further recommendations.  Acute hypoxic respiratory failure: Resolved. Secondary to above, CTA chest negative for PE.  History of aspergilloma status post VATS 2012: On chronic lifelong voriconazole-followed at the infectious disease clinic. Concern for recurrent infection causing hemoptysis-see above.  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 Reviewed outpatient ID notes (Dr. Lucianne Lei dam 08/08/15)-unfortunately patient has recurrence of symptoms when Zithromax/ethambutol discontinued. Follow  with ID at next appointment.  Bullous lung disease: Stable.  Hypothyroidism: Continue Synthroid  GERD: Continue PPI  Severe protein calorie malnutrition: Continue supplements     Disposition: Remain inpatient-home when cleared by pulmonology    Antimicrobial agents  See below  Anti-infectives    Start     Dose/Rate Route Frequency Ordered Stop   10/14/15 1000  azithromycin (ZITHROMAX) tablet 500 mg     500 mg Oral Daily 10/14/15 0836     10/13/15 1500  ethambutol (MYAMBUTOL) tablet 1,000 mg     1,000 mg Oral Daily 10/13/15 1057     10/13/15 1000  voriconazole (VFEND) tablet 300 mg     300 mg Oral Every 12 hours 10/13/15 0945     10/13/15 0800  voriconazole (VFEND) 380 mg in sodium chloride 0.9 % 100 mL IVPB  Status:  Discontinued     6 mg/kg  63 kg 69 mL/hr over 120 Minutes Intravenous Every 12 hours 10/13/15 0638 10/13/15 0641   10/13/15 0745  azithromycin (ZITHROMAX) 500 mg in dextrose 5 % 250 mL IVPB     500 mg 250 mL/hr over 60 Minutes Intravenous  Once 10/13/15 0737 10/13/15 0850   10/13/15 0743  azithromycin (ZITHROMAX) 500 MG injection    Comments:  Rolla Etienne   : cabinet override      10/13/15 0743 10/13/15 0750      DVT Prophylaxis:  SCD's  Code Status: Full code  Family Communication None at bedside  Procedures: 11/24>>empiric embolization of bilateral bronchial arteries  CONSULTS:   pulmonary/intensive care and IR  Time spent 30 minutes-Greater than 50% of this time was spent in counseling, explanation of diagnosis, planning of further management, and coordination of care.  MEDICATIONS: Scheduled Meds: . arformoterol  15 mcg Nebulization BID  . azithromycin  500 mg Oral Daily  . budesonide  0.5 mg Nebulization BID  . ethambutol  1,000 mg Oral Daily  . feeding supplement (ENSURE ENLIVE)  237 mL Oral BID BM  . ferrous sulfate  325 mg Oral Daily  . levothyroxine  50 mcg Oral QAC breakfast  . multivitamin with minerals  1 tablet Oral  Daily  . pantoprazole  40 mg Oral Q1200  . predniSONE  10 mg Oral Daily  . sodium chloride HYPERTONIC  4 mL Nebulization Daily  . tiotropium  18 mcg Inhalation Daily  . voriconazole  300 mg Oral Q12H   Continuous Infusions: . sodium chloride 10 mL/hr at 10/15/15 1617   PRN Meds:.acetaminophen, albuterol    PHYSICAL EXAM:  Vital signs in last 24 hours: Filed Vitals:   10/15/15 2057 10/15/15 2258 10/16/15 0612 10/16/15 0843  BP:  109/71 120/73   Pulse:  74 77 91  Temp:  98 F (36.7 C) 98 F (36.7 C)   TempSrc:  Oral Oral   Resp:  _0 Height:      Weight:      SpO2: 98% 98% 95% 94%    Weight change:  Filed Weights   10/13/15 0600 10/13/15 0922 10/14/15 0327  Weight: 63 kg (138 lb 14.2 oz) 59.3 kg (130 lb 11.7 oz) 60.6 kg (133 lb 9.6 oz)   Body mass index is 19.72 kg/(m^2).   Gen Exam: Awake and alert with clear speech.  Neck: Supple, No JVD.   Chest: B/L Clear.   CVS: S1 S2 Regular, no murmurs.  Abdomen: soft, BS +, non tender, non distended.  Extremities: no edema, lower extremities warm to touch. Neurologic: Non Focal.   Skin: No Rash.   Wounds: N/A.   Intake/Output from previous day:  Intake/Output Summary (Last 24 hours) at 10/16/15 1104 Last data filed at 10/15/15 1811  Gross per 24 hour  Intake   1734 ml  Output      0 ml  Net   1734 ml     LAB RESULTS: CBC  Recent Labs Lab 10/13/15 0400 10/13/15 0420 10/14/15 0228 10/15/15 0444  WBC 9.7  --  19.0* 12.6*  HGB 15.4 16.0 14.6 13.5  HCT 44.9 47.0 42.9 39.6  PLT 237  --  203 166  MCV 88.4  --  90.3 89.6  MCH 30.3  --  30.7 30.5  MCHC 34.3  --  34.0 34.1  RDW 13.3  --  13.3 13.2  LYMPHSABS 1.9  --   --  1.0  MONOABS 0.4  --   --  0.8  EOSABS 0.1  --   --  0.1  BASOSABS 0.0  --   --  0.0    Chemistries   Recent Labs Lab 10/13/15 0400 10/13/15 0420 10/14/15 0228 10/15/15 0444  NA 139 137 133* 136  K 3.8 3.8 3.6 4.0  CL 104 108 101 105  CO2 28  --  25 24  GLUCOSE 136* 129*  97 104*  BUN _1 CREATININE 0.93 0.90 1.00 0.91  CALCIUM 8.4*  --  8.0* 7.9*    CBG:  Recent Labs Lab 10/13/15 0932  GLUCAP 78    GFR Estimated Creatinine Clearance: 91.6 mL/min (by C-G formula based on Cr of 0.91).  Coagulation profile  Recent Labs Lab 10/13/15 0400  INR 0.95    Cardiac Enzymes No results for input(s): CKMB, TROPONINI, MYOGLOBIN in the last 168 hours.  Invalid input(s): CK  Invalid input(s): POCBNP No results for input(s): DDIMER in the last 72 hours. No results for  input(s): HGBA1C in the last 72 hours. No results for input(s): CHOL, HDL, LDLCALC, TRIG, CHOLHDL, LDLDIRECT in the last 72 hours.  Recent Labs  10/13/15 1240  TSH 4.085   No results for input(s): VITAMINB12, FOLATE, FERRITIN, TIBC, IRON, RETICCTPCT in the last 72 hours. No results for input(s): LIPASE, AMYLASE in the last 72 hours.  Urine Studies No results for input(s): UHGB, CRYS in the last 72 hours.  Invalid input(s): UACOL, UAPR, USPG, UPH, UTP, UGL, UKET, UBIL, UNIT, UROB, ULEU, UEPI, UWBC, URBC, UBAC, CAST, UCOM, BILUA  MICROBIOLOGY: Recent Results (from the past 240 hour(s))  Blood culture (routine x 2)     Status: None (Preliminary result)   Collection Time: 10/13/15  4:30 AM  Result Value Ref Range Status   Specimen Description BLOOD RIGHT ANTECUBITAL  Final   Special Requests BOTTLES DRAWN AEROBIC AND ANAEROBIC 5CC  Final   Culture   Final    NO GROWTH 3 DAYS Performed at [facility]    Report Status PENDING  Incomplete  Blood culture (routine x 2)     Status: None (Preliminary result)   Collection Time: 10/13/15  4:35 AM  Result Value Ref Range Status   Specimen Description BLOOD BLOOD RIGHT FOREARM  Final   Special Requests BOTTLES DRAWN AEROBIC AND ANAEROBIC 5CC  Final   Culture   Final    NO GROWTH 3 DAYS Performed at [facility]    Report Status PENDING  Incomplete  MRSA PCR Screening     Status: None   Collection Time:  10/13/15  9:15 AM  Result Value Ref Range Status   MRSA by PCR NEGATIVE NEGATIVE Final    Comment:        The GeneXpert MRSA Assay (FDA approved for NASAL specimens only), is one component of a comprehensive MRSA colonization surveillance program. It is not intended to diagnose MRSA infection nor to guide or monitor treatment for MRSA infections.   AFB culture with smear     Status: None (Preliminary result)   Collection Time: 10/13/15 12:00 PM  Result Value Ref Range Status   Specimen Description SPUTUM  Final   Special Requests Immunocompromised  Final   Acid Fast Smear   Final    NO ACID FAST BACILLI SEEN Performed at [facility]    Culture   Final    CULTURE WILL BE EXAMINED FOR 6 WEEKS BEFORE ISSUING A FINAL REPORT Performed at [facility]    Report Status PENDING  Incomplete  Culture, expectorated sputum-assessment     Status: None   Collection Time: 10/13/15 12:00 PM  Result Value Ref Range Status   Specimen Description EXPECTORATED SPUTUM  Final   Special Requests Immunocompromised  Final   Sputum evaluation   Final    THIS SPECIMEN IS ACCEPTABLE. RESPIRATORY CULTURE REPORT TO FOLLOW.   Report Status 10/13/2015 FINAL  Final  Culture, respiratory (NON-Expectorated)     Status: None (Preliminary result)   Collection Time: 10/13/15 12:00 PM  Result Value Ref Range Status   Specimen Description SPUTUM  Final   Special Requests NONE  Final   Gram Stain   Final    ABUNDANT WBC PRESENT,BOTH PMN AND MONONUCLEAR RARE SQUAMOUS EPITHELIAL CELLS PRESENT MODERATE GRAM POSITIVE COCCI IN PAIRS IN CHAINS IN CLUSTERS Performed at [facility]    Culture   Final    Culture reincubated for better growth Performed at [facility]    Report Status PENDING  Incomplete  Culture, expectorated sputum-assessment     Status: None   Collection Time: 10/15/15  2:26 AM  Result Value Ref Range Status   Specimen Description SPUTUM  Final   Special  Requests Normal  Final   Sputum evaluation   Final    THIS SPECIMEN IS ACCEPTABLE. RESPIRATORY CULTURE REPORT TO FOLLOW.   Report Status 10/15/2015 FINAL  Final  Culture, respiratory (NON-Expectorated)     Status: None (Preliminary result)   Collection Time: 10/15/15  2:26 AM  Result Value Ref Range Status   Specimen Description EXPECTORATED SPUTUM  Final   Special Requests NONE  Final   Gram Stain   Final    RARE WBC PRESENT, PREDOMINANTLY PMN RARE SQUAMOUS EPITHELIAL CELLS PRESENT MODERATE GRAM POSITIVE COCCI IN PAIRS AND CHAINS Performed at [facility]    Culture PENDING  Incomplete   Report Status PENDING  Incomplete    RADIOLOGY STUDIES/RESULTS: Ct Angio Chest Pe W/cm &/or Wo Cm  10/13/2015  CLINICAL DATA:  Acute onset of hemoptysis, chest tightness and shortness of breath. Initial encounter. EXAM: CT ANGIOGRAPHY CHEST WITH CONTRAST TECHNIQUE: Multidetector CT imaging of the chest was performed using the standard protocol during bolus administration of intravenous contrast. Multiplanar CT image reconstructions and MIPs were obtained to evaluate the vascular anatomy. CONTRAST:  141m OMNIPAQUE IOHEXOL 350 MG/ML SOLN COMPARISON:  Chest radiograph performed earlier today at 4:25 a.m., and CT of the chest performed 03/05/2011 FINDINGS: There is no evidence of pulmonary embolus. The previously noted loculated left-sided pneumothorax has decreased significantly in size. The large bilateral bullae are again noted. There is new dense layering airspace opacification at the large left apical bulla, extending inferiorly along the posterior aspect of the left upper lobe, suspicious for acute fungal infection given its appearance. 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 This is in a slightly different distribution from the prior study, but appears relatively stable. There is no evidence of pleural effusion. A new  small right basilar pneumothorax is noted. Vague borderline prominent aortopulmonary window and subcarinal nodes are suggested, measuring up to 1.1 cm in short axis. The mediastinum is otherwise unremarkable. No pericardial effusion is identified. The great vessels are grossly unremarkable. No axillary lymphadenopathy is seen. The thyroid gland is unremarkable in appearance. The visualized portions of the liver and spleen are unremarkable. The visualized portions of the pancreas, stomach, adrenal glands and left kidney are within normal limits. No acute osseous abnormalities are seen. Review of the MIP images confirms the above findings. IMPRESSION: 1. No evidence of pulmonary embolus. 2. New dense layering airspace opacification at the patient's large left apical bulla, extending inferiorly along the posterior aspect of the left upper lobe. This is suspicious for acute fungal infection (i.e., a fungal ball), given its appearance. 3. 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 4. Previously noted loculated left-sided pneumothorax has decreased significantly in size. 5. New small right basilar pneumothorax noted. 6. Underlying large bilateral bullae again noted. 7. Suggestion of mildly prominent mediastinal nodes. These results were called by telephone at the time of interpretation on 10/13/2015 at 6:05 am to Dr. KPryor Curia who verbally acknowledged these results. Electronically Signed   By: JGarald BaldingM.D.   On: 10/13/2015 06:05   Ir Aorta/thoracic  10/13/2015  CLINICAL DATA:  39year old male with a history of chronic pulmonary infection with MAC, as well as prior resection for Aspergilloma. He has developed  hemoptysis, with significant bleeding on the prior night. Bronchoscopy could not be performed because of concerns for the patient's pulmonary status. Bilateral empiric embolization is planned. EXAM: ULTRASOUND GUIDED ACCESS  RIGHT COMMON FEMORAL ARTERY THORACIC AORTIC ANGIOGRAM SELECTIVE ANGIOGRAM OF MULTIPLE BILATERAL THORACIC SEGMENTAL VESSELS INCLUDING BILATERAL BRONCHIAL ARTERIES. 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 Date:  11/24/201611/24/2016 3:52 pm FLUOROSCOPY TIME:  22 minutes, 24 seconds MEDICATIONS AND MEDICAL HISTORY: 1.0 mg Versed, 50 mcg fentanyl ANESTHESIA/SEDATION: 73 minutes CONTRAST:  213m OMNIPAQUE IOHEXOL 300 MG/ML  SOLN COMPLICATIONS: None PROCEDURE: Informed consent was obtained from the patient following explanation of the procedure, risks, benefits and alternatives. Specific risks include bleeding, infection, arterial injury, need for further surgery or procedure, kidney injury, contrast reaction, non targeted embolization, neurologic injury/ deficit, cardiopulmonary collapse, death The patient understands, agrees and consents for the procedure. All questions were addressed. A time out was performed. Patient is position in the supine position on the fluoroscopy table. The right inguinal region was prepped and draped in the usual sterile fashion. Maximum sterile protection was used including gown and gloves mask. 1% lidocaine was used for local anesthesia. Ultrasound survey of the right inguinal region was performed with images stored and sent to PACs. A micropuncture needle was used access the right common femoral artery under ultrasound. With excellent arterial blood flow returned, and an .018 micro wire was passed through the needle, observed enter the abdominal aorta under fluoroscopy. The needle was removed, and a micropuncture sheath was placed over the wire. The inner dilator and wire were removed, and an 035 Bentson wire was advanced under fluoroscopy into the abdominal aorta. The sheath was removed and a standard 5 [place] sheath was placed. The dilator was  removed and the sheath was flushed. Bentson wire was advanced to the aortic arch, and a pigtail catheter was passed over the wire to the aortic arch. Thoracic aortic angiogram was performed. Pigtail catheter was exchanged for a Mickelson catheter which was used to select multiple segmental vessels of the thoracic arch. Angiogram was performed of each vessel selected. Once the right bronchial artery was engaged, angiogram was performed. Micro catheter system including a high-flow Renegade catheter was passed into the right bronchial artery, at a safe distance from the origin to avoid refluxing into the aorta. Empiric embolization was then performed with 500 - 700 micro meter embospheres. One vial was used. Micro catheter system was then removed, and a final angiogram was performed through the base catheter. Mickelson catheter was then used to select multiple segmental vessels searching for left bronchial artery. Exchange was made over the Bentson wire for a shunt catheter. The Chung catheter was successful with engaging the origin of left bronchial artery. Angiogram performed. Micro catheter system was then advanced into the left bronchial artery for empiric embolization. Five hundred - 700 micro meter embospheres were used. One vial used. Repeat angiogram was performed. Angiogram of the right common femoral artery was performed. Exoseal was deployed. Patient tolerated the procedure well and remained hemodynamically stable throughout. No complications were encountered and no significant blood loss encountered. FINDINGS: Thoracic aorta demonstrates no dissection flap or aneurysm. No significant atherosclerotic disease along the lumen of the aorta. Multiple segmental vessels identified including 2 right bronchial artery and single left bronchial artery from the aortic angiogram. Superior right bronchial artery angiogram: Tortuous vasculature without stenosis at the origin. Vessel is of large caliber and  supplies a  cluster of abnormal vessels at the apex of the lung. Inflammatory tissue at the lung apex a demonstrates dense enhancement with no early draining vein identified. No evidence of arterial contribution towards the spinal canal. Status post microsphere embolization of the right superior bronchial artery there is no significant residual blood flow. Angiogram right supreme intercostal artery angiogram: No contribution to abnormal lung tissue. Angiogram right superior intercostal angiogram: No contribution to the lung apex. Left T9 segmental vessel angiogram: No contribution to the abnormal lung tissue. No reticular medullary vessel identified. Right inferior bronchial artery angiogram: No contribution to the abnormal vasculature at the right apex. Left T8 segmental vessel angiogram: No contribution to abnormal left-sided lung tissue. Left bronchial artery angiogram: Circuitous vasculature with significant contribution to abnormal lung tissue at the left apex. There is dense opacification of inflammatory tissue at the left apex. No extravasation is identified. No contribution of vasculature directed towards the spinal canal. Status post empiric embolization with 500-700 embospheres, no significant contribution of the left bronchial artery to the left apex. IMPRESSION: Status post thoracic aortic and segmental vessel angiogram with empiric embolization of left bronchial artery and superior right bronchial artery, each contributing significant blood flow to abnormal enhancing inflammatory tissue at the left and right apex, respectively. Both artery were embolized to stasis, with no significant opacification of abnormal tissue at the completion. Status post Exoseal deployment. Signed, Dulcy Fanny. Earleen Newport, DO [facility] Radiology Electronically Signed   By: Corrie Mckusick D.O.   On: 10/13/2015 19:21   Ir Angiogram Selective Each Additional Vessel  10/13/2015  CLINICAL DATA:   39 year old male with a history of chronic pulmonary infection with MAC, as well as prior resection for Aspergilloma. He has developed hemoptysis, with significant bleeding on the prior night. Bronchoscopy could not be performed because of concerns for the patient's pulmonary status. Bilateral empiric embolization is planned. EXAM: ULTRASOUND GUIDED ACCESS RIGHT COMMON FEMORAL ARTERY THORACIC AORTIC ANGIOGRAM SELECTIVE ANGIOGRAM OF MULTIPLE BILATERAL THORACIC SEGMENTAL VESSELS INCLUDING BILATERAL BRONCHIAL ARTERIES. 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 Date:  11/24/201611/24/2016 3:52 pm FLUOROSCOPY TIME:  22 minutes, 24 seconds MEDICATIONS AND MEDICAL HISTORY: 1.0 mg Versed, 50 mcg fentanyl ANESTHESIA/SEDATION: 73 minutes CONTRAST:  228m OMNIPAQUE IOHEXOL 300 MG/ML  SOLN COMPLICATIONS: None PROCEDURE: Informed consent was obtained from the patient following explanation of the procedure, risks, benefits and alternatives. Specific risks include bleeding, infection, arterial injury, need for further surgery or procedure, kidney injury, contrast reaction, non targeted embolization, neurologic injury/ deficit, cardiopulmonary collapse, death The patient understands, agrees and consents for the procedure. All questions were addressed. A time out was performed. Patient is position in the supine position on the fluoroscopy table. The right inguinal region was prepped and draped in the usual sterile fashion. Maximum sterile protection was used including gown and gloves mask. 1% lidocaine was used for local anesthesia. Ultrasound survey of the right inguinal region was performed with images stored and sent to PACs. A micropuncture needle was used access the right common femoral artery under ultrasound. With excellent arterial blood flow returned, and an .018 micro wire was passed through the  needle, observed enter the abdominal aorta under fluoroscopy. The needle was removed, and a micropuncture sheath was placed over the wire. The inner dilator and wire were removed, and an 035 Bentson wire was advanced under fluoroscopy into the abdominal aorta. The sheath  was removed and a standard 5 [place] vascular sheath was placed. The dilator was removed and the sheath was flushed. Bentson wire was advanced to the aortic arch, and a pigtail catheter was passed over the wire to the aortic arch. Thoracic aortic angiogram was performed. Pigtail catheter was exchanged for a Mickelson catheter which was used to select multiple segmental vessels of the thoracic arch. Angiogram was performed of each vessel selected. Once the right bronchial artery was engaged, angiogram was performed. Micro catheter system including a high-flow Renegade catheter was passed into the right bronchial artery, at a safe distance from the origin to avoid refluxing into the aorta. Empiric embolization was then performed with 500 - 700 micro meter embospheres. One vial was used. Micro catheter system was then removed, and a final angiogram was performed through the base catheter. Mickelson catheter was then used to select multiple segmental vessels searching for left bronchial artery. Exchange was made over the Bentson wire for a shunt catheter. The Chung catheter was successful with engaging the origin of left bronchial artery. Angiogram performed. Micro catheter system was then advanced into the left bronchial artery for empiric embolization. Five hundred - 700 micro meter embospheres were used. One vial used. Repeat angiogram was performed. Angiogram of the right common femoral artery was performed. Exoseal was deployed. Patient tolerated the procedure well and remained hemodynamically stable throughout. No complications were encountered and no significant blood loss encountered. FINDINGS: Thoracic aorta demonstrates no dissection flap or  aneurysm. No significant atherosclerotic disease along the lumen of the aorta. Multiple segmental vessels identified including 2 right bronchial artery and single left bronchial artery from the aortic angiogram. Superior right bronchial artery angiogram: Tortuous vasculature without stenosis at the origin. Vessel is of large caliber and supplies a cluster of abnormal vessels at the apex of the lung. Inflammatory tissue at the lung apex a demonstrates dense enhancement with no early draining vein identified. No evidence of arterial contribution towards the spinal canal. Status post microsphere embolization of the right superior bronchial artery there is no significant residual blood flow. Angiogram right supreme intercostal artery angiogram: No contribution to abnormal lung tissue. Angiogram right superior intercostal angiogram: No contribution to the lung apex. Left T9 segmental vessel angiogram: No contribution to the abnormal lung tissue. No reticular medullary vessel identified. Right inferior bronchial artery angiogram: No contribution to the abnormal vasculature at the right apex. Left T8 segmental vessel angiogram: No contribution to abnormal left-sided lung tissue. Left bronchial artery angiogram: Circuitous vasculature with significant contribution to abnormal lung tissue at the left apex. There is dense opacification of inflammatory tissue at the left apex. No extravasation is identified. No contribution of vasculature directed towards the spinal canal. Status post empiric embolization with 500-700 embospheres, no significant contribution of the left bronchial artery to the left apex. IMPRESSION: Status post thoracic aortic and segmental vessel angiogram with empiric embolization of left bronchial artery and superior right bronchial artery, each contributing significant blood flow to abnormal enhancing inflammatory tissue at the left and right apex, respectively. Both artery were embolized to stasis, with  no significant opacification of abnormal tissue at the completion. Status post Exoseal deployment. Signed, Dulcy Fanny. Earleen Newport, DO [facility] Radiology Electronically Signed   By: Corrie Mckusick D.O.   On: 10/13/2015 19:21   Ir Angiogram Selective Each Additional Vessel  10/13/2015  CLINICAL DATA:  39 year old male with a history of chronic pulmonary infection with MAC, as well as prior resection for Aspergilloma.  He has developed hemoptysis, with significant bleeding on the prior night. Bronchoscopy could not be performed because of concerns for the patient's pulmonary status. Bilateral empiric embolization is planned. EXAM: ULTRASOUND GUIDED ACCESS RIGHT COMMON FEMORAL ARTERY THORACIC AORTIC ANGIOGRAM SELECTIVE ANGIOGRAM OF MULTIPLE BILATERAL THORACIC SEGMENTAL VESSELS INCLUDING BILATERAL BRONCHIAL ARTERIES. 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 Date:  11/24/201611/24/2016 3:52 pm FLUOROSCOPY TIME:  22 minutes, 24 seconds MEDICATIONS AND MEDICAL HISTORY: 1.0 mg Versed, 50 mcg fentanyl ANESTHESIA/SEDATION: 73 minutes CONTRAST:  255m OMNIPAQUE IOHEXOL 300 MG/ML  SOLN COMPLICATIONS: None PROCEDURE: Informed consent was obtained from the patient following explanation of the procedure, risks, benefits and alternatives. Specific risks include bleeding, infection, arterial injury, need for further surgery or procedure, kidney injury, contrast reaction, non targeted embolization, neurologic injury/ deficit, cardiopulmonary collapse, death The patient understands, agrees and consents for the procedure. All questions were addressed. A time out was performed. Patient is position in the supine position on the fluoroscopy table. The right inguinal region was prepped and draped in the usual sterile fashion. Maximum sterile protection was used  including gown and gloves mask. 1% lidocaine was used for local anesthesia. Ultrasound survey of the right inguinal region was performed with images stored and sent to PACs. A micropuncture needle was used access the right common femoral artery under ultrasound. With excellent arterial blood flow returned, and an .018 micro wire was passed through the needle, observed enter the abdominal aorta under fluoroscopy. The needle was removed, and a micropuncture sheath was placed over the wire. The inner dilator and wire were removed, and an 035 Bentson wire was advanced under fluoroscopy into the abdominal aorta. The sheath was removed and a standard 5 [place] sheath was placed. The dilator was removed and the sheath was flushed. Bentson wire was advanced to the aortic arch, and a pigtail catheter was passed over the wire to the aortic arch. Thoracic aortic angiogram was performed. Pigtail catheter was exchanged for a Mickelson catheter which was used to select multiple segmental vessels of the thoracic arch. Angiogram was performed of each vessel selected. Once the right bronchial artery was engaged, angiogram was performed. Micro catheter system including a high-flow Renegade catheter was passed into the right bronchial artery, at a safe distance from the origin to avoid refluxing into the aorta. Empiric embolization was then performed with 500 - 700 micro meter embospheres. One vial was used. Micro catheter system was then removed, and a final angiogram was performed through the base catheter. Mickelson catheter was then used to select multiple segmental vessels searching for left bronchial artery. Exchange was made over the Bentson wire for a shunt catheter. The Chung catheter was successful with engaging the origin of left bronchial artery. Angiogram performed. Micro catheter system was then advanced into the left bronchial artery for empiric embolization. Five hundred - 700 micro meter embospheres were used.  One vial used. Repeat angiogram was performed. Angiogram of the right common femoral artery was performed. Exoseal was deployed. Patient tolerated the procedure well and remained hemodynamically stable throughout. No complications were encountered and no significant blood loss encountered. FINDINGS: Thoracic aorta demonstrates no dissection flap or aneurysm. No significant atherosclerotic disease along the lumen of the aorta. Multiple segmental vessels identified including 2 right bronchial artery and single left bronchial artery from the aortic angiogram. Superior right bronchial artery angiogram: Tortuous vasculature without stenosis at the origin. Vessel is of  large caliber and supplies a cluster of abnormal vessels at the apex of the lung. Inflammatory tissue at the lung apex a demonstrates dense enhancement with no early draining vein identified. No evidence of arterial contribution towards the spinal canal. Status post microsphere embolization of the right superior bronchial artery there is no significant residual blood flow. Angiogram right supreme intercostal artery angiogram: No contribution to abnormal lung tissue. Angiogram right superior intercostal angiogram: No contribution to the lung apex. Left T9 segmental vessel angiogram: No contribution to the abnormal lung tissue. No reticular medullary vessel identified. Right inferior bronchial artery angiogram: No contribution to the abnormal vasculature at the right apex. Left T8 segmental vessel angiogram: No contribution to abnormal left-sided lung tissue. Left bronchial artery angiogram: Circuitous vasculature with significant contribution to abnormal lung tissue at the left apex. There is dense opacification of inflammatory tissue at the left apex. No extravasation is identified. No contribution of vasculature directed towards the spinal canal. Status post empiric embolization with 500-700 embospheres, no significant contribution of the left bronchial  artery to the left apex. IMPRESSION: Status post thoracic aortic and segmental vessel angiogram with empiric embolization of left bronchial artery and superior right bronchial artery, each contributing significant blood flow to abnormal enhancing inflammatory tissue at the left and right apex, respectively. Both artery were embolized to stasis, with no significant opacification of abnormal tissue at the completion. Status post Exoseal deployment. Signed, Dulcy Fanny. Earleen Newport, DO [facility] Radiology Electronically Signed   By: Corrie Mckusick D.O.   On: 10/13/2015 19:21   Ir Angiogram Selective Each Additional Vessel  10/13/2015  CLINICAL DATA:  39 year old male with a history of chronic pulmonary infection with MAC, as well as prior resection for Aspergilloma. He has developed hemoptysis, with significant bleeding on the prior night. Bronchoscopy could not be performed because of concerns for the patient's pulmonary status. Bilateral empiric embolization is planned. EXAM: ULTRASOUND GUIDED ACCESS RIGHT COMMON FEMORAL ARTERY THORACIC AORTIC ANGIOGRAM SELECTIVE ANGIOGRAM OF MULTIPLE BILATERAL THORACIC SEGMENTAL VESSELS INCLUDING BILATERAL BRONCHIAL ARTERIES. 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 Date:  11/24/201611/24/2016 3:52 pm FLUOROSCOPY TIME:  22 minutes, 24 seconds MEDICATIONS AND MEDICAL HISTORY: 1.0 mg Versed, 50 mcg fentanyl ANESTHESIA/SEDATION: 73 minutes CONTRAST:  241m OMNIPAQUE IOHEXOL 300 MG/ML  SOLN COMPLICATIONS: None PROCEDURE: Informed consent was obtained from the patient following explanation of the procedure, risks, benefits and alternatives. Specific risks include bleeding, infection, arterial injury, need for further surgery or procedure, kidney injury, contrast reaction, non targeted embolization, neurologic  injury/ deficit, cardiopulmonary collapse, death The patient understands, agrees and consents for the procedure. All questions were addressed. A time out was performed. Patient is position in the supine position on the fluoroscopy table. The right inguinal region was prepped and draped in the usual sterile fashion. Maximum sterile protection was used including gown and gloves mask. 1% lidocaine was used for local anesthesia. Ultrasound survey of the right inguinal region was performed with images stored and sent to PACs. A micropuncture needle was used access the right common femoral artery under ultrasound. With excellent arterial blood flow returned, and an .018 micro wire was passed through the needle, observed enter the abdominal aorta under fluoroscopy. The needle was removed, and a micropuncture sheath was placed over the wire. The inner dilator and wire were removed, and an 035 Bentson wire was advanced under fluoroscopy into the abdominal aorta.  The sheath was removed and a standard 5 [place] vascular sheath was placed. The dilator was removed and the sheath was flushed. Bentson wire was advanced to the aortic arch, and a pigtail catheter was passed over the wire to the aortic arch. Thoracic aortic angiogram was performed. Pigtail catheter was exchanged for a Mickelson catheter which was used to select multiple segmental vessels of the thoracic arch. Angiogram was performed of each vessel selected. Once the right bronchial artery was engaged, angiogram was performed. Micro catheter system including a high-flow Renegade catheter was passed into the right bronchial artery, at a safe distance from the origin to avoid refluxing into the aorta. Empiric embolization was then performed with 500 - 700 micro meter embospheres. One vial was used. Micro catheter system was then removed, and a final angiogram was performed through the base catheter. Mickelson catheter was then used to select multiple segmental vessels  searching for left bronchial artery. Exchange was made over the Bentson wire for a shunt catheter. The Chung catheter was successful with engaging the origin of left bronchial artery. Angiogram performed. Micro catheter system was then advanced into the left bronchial artery for empiric embolization. Five hundred - 700 micro meter embospheres were used. One vial used. Repeat angiogram was performed. Angiogram of the right common femoral artery was performed. Exoseal was deployed. Patient tolerated the procedure well and remained hemodynamically stable throughout. No complications were encountered and no significant blood loss encountered. FINDINGS: Thoracic aorta demonstrates no dissection flap or aneurysm. No significant atherosclerotic disease along the lumen of the aorta. Multiple segmental vessels identified including 2 right bronchial artery and single left bronchial artery from the aortic angiogram. Superior right bronchial artery angiogram: Tortuous vasculature without stenosis at the origin. Vessel is of large caliber and supplies a cluster of abnormal vessels at the apex of the lung. Inflammatory tissue at the lung apex a demonstrates dense enhancement with no early draining vein identified. No evidence of arterial contribution towards the spinal canal. Status post microsphere embolization of the right superior bronchial artery there is no significant residual blood flow. Angiogram right supreme intercostal artery angiogram: No contribution to abnormal lung tissue. Angiogram right superior intercostal angiogram: No contribution to the lung apex. Left T9 segmental vessel angiogram: No contribution to the abnormal lung tissue. No reticular medullary vessel identified. Right inferior bronchial artery angiogram: No contribution to the abnormal vasculature at the right apex. Left T8 segmental vessel angiogram: No contribution to abnormal left-sided lung tissue. Left bronchial artery angiogram: Circuitous  vasculature with significant contribution to abnormal lung tissue at the left apex. There is dense opacification of inflammatory tissue at the left apex. No extravasation is identified. No contribution of vasculature directed towards the spinal canal. Status post empiric embolization with 500-700 embospheres, no significant contribution of the left bronchial artery to the left apex. IMPRESSION: Status post thoracic aortic and segmental vessel angiogram with empiric embolization of left bronchial artery and superior right bronchial artery, each contributing significant blood flow to abnormal enhancing inflammatory tissue at the left and right apex, respectively. Both artery were embolized to stasis, with no significant opacification of abnormal tissue at the completion. Status post Exoseal deployment. Signed, Dulcy Fanny. Earleen Newport, DO [facility] Radiology Electronically Signed   By: Corrie Mckusick D.O.   On: 10/13/2015 19:21   Ir Angiogram Selective Each Additional Vessel  10/13/2015  CLINICAL DATA:  39 year old male with a history of chronic pulmonary infection with MAC, as well as prior resection  for Aspergilloma. He has developed hemoptysis, with significant bleeding on the prior night. Bronchoscopy could not be performed because of concerns for the patient's pulmonary status. Bilateral empiric embolization is planned. EXAM: ULTRASOUND GUIDED ACCESS RIGHT COMMON FEMORAL ARTERY THORACIC AORTIC ANGIOGRAM SELECTIVE ANGIOGRAM OF MULTIPLE BILATERAL THORACIC SEGMENTAL VESSELS INCLUDING BILATERAL BRONCHIAL ARTERIES. 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 Date:  11/24/201611/24/2016 3:52 pm FLUOROSCOPY TIME:  22 minutes, 24 seconds MEDICATIONS AND MEDICAL HISTORY: 1.0 mg Versed, 50 mcg fentanyl ANESTHESIA/SEDATION: 73 minutes CONTRAST:  221m  OMNIPAQUE IOHEXOL 300 MG/ML  SOLN COMPLICATIONS: None PROCEDURE: Informed consent was obtained from the patient following explanation of the procedure, risks, benefits and alternatives. Specific risks include bleeding, infection, arterial injury, need for further surgery or procedure, kidney injury, contrast reaction, non targeted embolization, neurologic injury/ deficit, cardiopulmonary collapse, death The patient understands, agrees and consents for the procedure. All questions were addressed. A time out was performed. Patient is position in the supine position on the fluoroscopy table. The right inguinal region was prepped and draped in the usual sterile fashion. Maximum sterile protection was used including gown and gloves mask. 1% lidocaine was used for local anesthesia. Ultrasound survey of the right inguinal region was performed with images stored and sent to PACs. A micropuncture needle was used access the right common femoral artery under ultrasound. With excellent arterial blood flow returned, and an .018 micro wire was passed through the needle, observed enter the abdominal aorta under fluoroscopy. The needle was removed, and a micropuncture sheath was placed over the wire. The inner dilator and wire were removed, and an 035 Bentson wire was advanced under fluoroscopy into the abdominal aorta. The sheath was removed and a standard 5 [place] sheath was placed. The dilator was removed and the sheath was flushed. Bentson wire was advanced to the aortic arch, and a pigtail catheter was passed over the wire to the aortic arch. Thoracic aortic angiogram was performed. Pigtail catheter was exchanged for a Mickelson catheter which was used to select multiple segmental vessels of the thoracic arch. Angiogram was performed of each vessel selected. Once the right bronchial artery was engaged, angiogram was performed. Micro catheter system including a high-flow Renegade catheter was passed into the right  bronchial artery, at a safe distance from the origin to avoid refluxing into the aorta. Empiric embolization was then performed with 500 - 700 micro meter embospheres. One vial was used. Micro catheter system was then removed, and a final angiogram was performed through the base catheter. Mickelson catheter was then used to select multiple segmental vessels searching for left bronchial artery. Exchange was made over the Bentson wire for a shunt catheter. The Chung catheter was successful with engaging the origin of left bronchial artery. Angiogram performed. Micro catheter system was then advanced into the left bronchial artery for empiric embolization. Five hundred - 700 micro meter embospheres were used. One vial used. Repeat angiogram was performed. Angiogram of the right common femoral artery was performed. Exoseal was deployed. Patient tolerated the procedure well and remained hemodynamically stable throughout. No complications were encountered and no significant blood loss encountered. FINDINGS: Thoracic aorta demonstrates no dissection flap or aneurysm. No significant atherosclerotic disease along the lumen of the aorta. Multiple segmental vessels identified including 2 right bronchial artery and single left bronchial artery from the aortic angiogram. Superior right bronchial artery angiogram: Tortuous vasculature without stenosis at the origin. Vessel  is of large caliber and supplies a cluster of abnormal vessels at the apex of the lung. Inflammatory tissue at the lung apex a demonstrates dense enhancement with no early draining vein identified. No evidence of arterial contribution towards the spinal canal. Status post microsphere embolization of the right superior bronchial artery there is no significant residual blood flow. Angiogram right supreme intercostal artery angiogram: No contribution to abnormal lung tissue. Angiogram right superior intercostal angiogram: No contribution to the lung apex. Left T9  segmental vessel angiogram: No contribution to the abnormal lung tissue. No reticular medullary vessel identified. Right inferior bronchial artery angiogram: No contribution to the abnormal vasculature at the right apex. Left T8 segmental vessel angiogram: No contribution to abnormal left-sided lung tissue. Left bronchial artery angiogram: Circuitous vasculature with significant contribution to abnormal lung tissue at the left apex. There is dense opacification of inflammatory tissue at the left apex. No extravasation is identified. No contribution of vasculature directed towards the spinal canal. Status post empiric embolization with 500-700 embospheres, no significant contribution of the left bronchial artery to the left apex. IMPRESSION: Status post thoracic aortic and segmental vessel angiogram with empiric embolization of left bronchial artery and superior right bronchial artery, each contributing significant blood flow to abnormal enhancing inflammatory tissue at the left and right apex, respectively. Both artery were embolized to stasis, with no significant opacification of abnormal tissue at the completion. Status post Exoseal deployment. Signed, Dulcy Fanny. Earleen Newport, DO [facility] Radiology Electronically Signed   By: Corrie Mckusick D.O.   On: 10/13/2015 19:21   Ir Angiogram Selective Each Additional Vessel  10/13/2015  CLINICAL DATA:  39 year old male with a history of chronic pulmonary infection with MAC, as well as prior resection for Aspergilloma. He has developed hemoptysis, with significant bleeding on the prior night. Bronchoscopy could not be performed because of concerns for the patient's pulmonary status. Bilateral empiric embolization is planned. EXAM: ULTRASOUND GUIDED ACCESS RIGHT COMMON FEMORAL ARTERY THORACIC AORTIC ANGIOGRAM SELECTIVE ANGIOGRAM OF MULTIPLE BILATERAL THORACIC SEGMENTAL VESSELS INCLUDING BILATERAL BRONCHIAL ARTERIES. 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 Date:  11/24/201611/24/2016 3:52 pm FLUOROSCOPY TIME:  22 minutes, 24 seconds MEDICATIONS AND MEDICAL HISTORY: 1.0 mg Versed, 50 mcg fentanyl ANESTHESIA/SEDATION: 73 minutes CONTRAST:  262m OMNIPAQUE IOHEXOL 300 MG/ML  SOLN COMPLICATIONS: None PROCEDURE: Informed consent was obtained from the patient following explanation of the procedure, risks, benefits and alternatives. Specific risks include bleeding, infection, arterial injury, need for further surgery or procedure, kidney injury, contrast reaction, non targeted embolization, neurologic injury/ deficit, cardiopulmonary collapse, death The patient understands, agrees and consents for the procedure. All questions were addressed. A time out was performed. Patient is position in the supine position on the fluoroscopy table. The right inguinal region was prepped and draped in the usual sterile fashion. Maximum sterile protection was used including gown and gloves mask. 1% lidocaine was used for local anesthesia. Ultrasound survey of the right inguinal region was performed with images stored and sent to PACs. A micropuncture needle was used access the right common femoral artery under ultrasound. With excellent arterial blood flow returned, and an .018 micro wire was passed through the needle, observed enter the abdominal aorta under fluoroscopy. The needle was removed, and a micropuncture sheath was placed over the wire. The inner dilator and wire were removed, and an 035 Bentson wire was advanced under fluoroscopy into the  abdominal aorta. The sheath was removed and a standard 5 [place] vascular sheath was placed. The dilator was removed and the sheath was flushed. Bentson wire was advanced to the aortic arch, and a pigtail catheter was passed over the wire to the aortic arch. Thoracic aortic angiogram  was performed. Pigtail catheter was exchanged for a Mickelson catheter which was used to select multiple segmental vessels of the thoracic arch. Angiogram was performed of each vessel selected. Once the right bronchial artery was engaged, angiogram was performed. Micro catheter system including a high-flow Renegade catheter was passed into the right bronchial artery, at a safe distance from the origin to avoid refluxing into the aorta. Empiric embolization was then performed with 500 - 700 micro meter embospheres. One vial was used. Micro catheter system was then removed, and a final angiogram was performed through the base catheter. Mickelson catheter was then used to select multiple segmental vessels searching for left bronchial artery. Exchange was made over the Bentson wire for a shunt catheter. The Chung catheter was successful with engaging the origin of left bronchial artery. Angiogram performed. Micro catheter system was then advanced into the left bronchial artery for empiric embolization. Five hundred - 700 micro meter embospheres were used. One vial used. Repeat angiogram was performed. Angiogram of the right common femoral artery was performed. Exoseal was deployed. Patient tolerated the procedure well and remained hemodynamically stable throughout. No complications were encountered and no significant blood loss encountered. FINDINGS: Thoracic aorta demonstrates no dissection flap or aneurysm. No significant atherosclerotic disease along the lumen of the aorta. Multiple segmental vessels identified including 2 right bronchial artery and single left bronchial artery from the aortic angiogram. Superior right bronchial artery angiogram: Tortuous vasculature without stenosis at the origin. Vessel is of large caliber and supplies a cluster of abnormal vessels at the apex of the lung. Inflammatory tissue at the lung apex a demonstrates dense enhancement with no early draining vein identified. No evidence of  arterial contribution towards the spinal canal. Status post microsphere embolization of the right superior bronchial artery there is no significant residual blood flow. Angiogram right supreme intercostal artery angiogram: No contribution to abnormal lung tissue. Angiogram right superior intercostal angiogram: No contribution to the lung apex. Left T9 segmental vessel angiogram: No contribution to the abnormal lung tissue. No reticular medullary vessel identified. Right inferior bronchial artery angiogram: No contribution to the abnormal vasculature at the right apex. Left T8 segmental vessel angiogram: No contribution to abnormal left-sided lung tissue. Left bronchial artery angiogram: Circuitous vasculature with significant contribution to abnormal lung tissue at the left apex. There is dense opacification of inflammatory tissue at the left apex. No extravasation is identified. No contribution of vasculature directed towards the spinal canal. Status post empiric embolization with 500-700 embospheres, no significant contribution of the left bronchial artery to the left apex. IMPRESSION: Status post thoracic aortic and segmental vessel angiogram with empiric embolization of left bronchial artery and superior right bronchial artery, each contributing significant blood flow to abnormal enhancing inflammatory tissue at the left and right apex, respectively. Both artery were embolized to stasis, with no significant opacification of abnormal tissue at the completion. Status post Exoseal deployment. Signed, Dulcy Fanny. Earleen Newport, DO [facility] Radiology Electronically Signed   By: Corrie Mckusick D.O.   On: 10/13/2015 19:21   Ir Angiogram Selective Each Additional Vessel  10/13/2015  CLINICAL DATA:  39 year old male with a history of chronic pulmonary infection with MAC, as well as  prior resection for Aspergilloma. He has developed hemoptysis, with significant bleeding on the  prior night. Bronchoscopy could not be performed because of concerns for the patient's pulmonary status. Bilateral empiric embolization is planned. EXAM: ULTRASOUND GUIDED ACCESS RIGHT COMMON FEMORAL ARTERY THORACIC AORTIC ANGIOGRAM SELECTIVE ANGIOGRAM OF MULTIPLE BILATERAL THORACIC SEGMENTAL VESSELS INCLUDING BILATERAL BRONCHIAL ARTERIES. 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 Date:  11/24/201611/24/2016 3:52 pm FLUOROSCOPY TIME:  22 minutes, 24 seconds MEDICATIONS AND MEDICAL HISTORY: 1.0 mg Versed, 50 mcg fentanyl ANESTHESIA/SEDATION: 73 minutes CONTRAST:  278m OMNIPAQUE IOHEXOL 300 MG/ML  SOLN COMPLICATIONS: None PROCEDURE: Informed consent was obtained from the patient following explanation of the procedure, risks, benefits and alternatives. Specific risks include bleeding, infection, arterial injury, need for further surgery or procedure, kidney injury, contrast reaction, non targeted embolization, neurologic injury/ deficit, cardiopulmonary collapse, death The patient understands, agrees and consents for the procedure. All questions were addressed. A time out was performed. Patient is position in the supine position on the fluoroscopy table. The right inguinal region was prepped and draped in the usual sterile fashion. Maximum sterile protection was used including gown and gloves mask. 1% lidocaine was used for local anesthesia. Ultrasound survey of the right inguinal region was performed with images stored and sent to PACs. A micropuncture needle was used access the right common femoral artery under ultrasound. With excellent arterial blood flow returned, and an .018 micro wire was passed through the needle, observed enter the abdominal aorta under fluoroscopy. The needle was removed, and a micropuncture sheath was placed over the wire. The inner dilator and wire were removed,  and an 035 Bentson wire was advanced under fluoroscopy into the abdominal aorta. The sheath was removed and a standard 5 [place] sheath was placed. The dilator was removed and the sheath was flushed. Bentson wire was advanced to the aortic arch, and a pigtail catheter was passed over the wire to the aortic arch. Thoracic aortic angiogram was performed. Pigtail catheter was exchanged for a Mickelson catheter which was used to select multiple segmental vessels of the thoracic arch. Angiogram was performed of each vessel selected. Once the right bronchial artery was engaged, angiogram was performed. Micro catheter system including a high-flow Renegade catheter was passed into the right bronchial artery, at a safe distance from the origin to avoid refluxing into the aorta. Empiric embolization was then performed with 500 - 700 micro meter embospheres. One vial was used. Micro catheter system was then removed, and a final angiogram was performed through the base catheter. Mickelson catheter was then used to select multiple segmental vessels searching for left bronchial artery. Exchange was made over the Bentson wire for a shunt catheter. The Chung catheter was successful with engaging the origin of left bronchial artery. Angiogram performed. Micro catheter system was then advanced into the left bronchial artery for empiric embolization. Five hundred - 700 micro meter embospheres were used. One vial used. Repeat angiogram was performed. Angiogram of the right common femoral artery was performed. Exoseal was deployed. Patient tolerated the procedure well and remained hemodynamically stable throughout. No complications were encountered and no significant blood loss encountered. FINDINGS: Thoracic aorta demonstrates no dissection flap or aneurysm. No significant atherosclerotic disease along the lumen of the aorta. Multiple segmental vessels identified including 2 right bronchial artery and single left bronchial  artery from the aortic angiogram. Superior right bronchial artery angiogram: Tortuous vasculature without stenosis at  the origin. Vessel is of large caliber and supplies a cluster of abnormal vessels at the apex of the lung. Inflammatory tissue at the lung apex a demonstrates dense enhancement with no early draining vein identified. No evidence of arterial contribution towards the spinal canal. Status post microsphere embolization of the right superior bronchial artery there is no significant residual blood flow. Angiogram right supreme intercostal artery angiogram: No contribution to abnormal lung tissue. Angiogram right superior intercostal angiogram: No contribution to the lung apex. Left T9 segmental vessel angiogram: No contribution to the abnormal lung tissue. No reticular medullary vessel identified. Right inferior bronchial artery angiogram: No contribution to the abnormal vasculature at the right apex. Left T8 segmental vessel angiogram: No contribution to abnormal left-sided lung tissue. Left bronchial artery angiogram: Circuitous vasculature with significant contribution to abnormal lung tissue at the left apex. There is dense opacification of inflammatory tissue at the left apex. No extravasation is identified. No contribution of vasculature directed towards the spinal canal. Status post empiric embolization with 500-700 embospheres, no significant contribution of the left bronchial artery to the left apex. IMPRESSION: Status post thoracic aortic and segmental vessel angiogram with empiric embolization of left bronchial artery and superior right bronchial artery, each contributing significant blood flow to abnormal enhancing inflammatory tissue at the left and right apex, respectively. Both artery were embolized to stasis, with no significant opacification of abnormal tissue at the completion. Status post Exoseal deployment. Signed, Dulcy Fanny. Earleen Newport, DO [facility]  [facility] Radiology Electronically Signed   By: Corrie Mckusick D.O.   On: 10/13/2015 19:21   Ir Angiogram Selective Each Additional Vessel  10/13/2015  CLINICAL DATA:  39 year old male with a history of chronic pulmonary infection with MAC, as well as prior resection for Aspergilloma. He has developed hemoptysis, with significant bleeding on the prior night. Bronchoscopy could not be performed because of concerns for the patient's pulmonary status. Bilateral empiric embolization is planned. EXAM: ULTRASOUND GUIDED ACCESS RIGHT COMMON FEMORAL ARTERY THORACIC AORTIC ANGIOGRAM SELECTIVE ANGIOGRAM OF MULTIPLE BILATERAL THORACIC SEGMENTAL VESSELS INCLUDING BILATERAL BRONCHIAL ARTERIES. 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 Date:  11/24/201611/24/2016 3:52 pm FLUOROSCOPY TIME:  22 minutes, 24 seconds MEDICATIONS AND MEDICAL HISTORY: 1.0 mg Versed, 50 mcg fentanyl ANESTHESIA/SEDATION: 73 minutes CONTRAST:  265m OMNIPAQUE IOHEXOL 300 MG/ML  SOLN COMPLICATIONS: None PROCEDURE: Informed consent was obtained from the patient following explanation of the procedure, risks, benefits and alternatives. Specific risks include bleeding, infection, arterial injury, need for further surgery or procedure, kidney injury, contrast reaction, non targeted embolization, neurologic injury/ deficit, cardiopulmonary collapse, death The patient understands, agrees and consents for the procedure. All questions were addressed. A time out was performed. Patient is position in the supine position on the fluoroscopy table. The right inguinal region was prepped and draped in the usual sterile fashion. Maximum sterile protection was used including gown and gloves mask. 1% lidocaine was used for local anesthesia. Ultrasound survey of the right inguinal region was performed with images stored and sent to PACs. A  micropuncture needle was used access the right common femoral artery under ultrasound. With excellent arterial blood flow returned, and an .018 micro wire was passed through the needle, observed enter the abdominal aorta under fluoroscopy. The needle was removed, and a micropuncture sheath was placed over the wire. The inner dilator and wire were removed, and an 035 Bentson wire was advanced under  fluoroscopy into the abdominal aorta. The sheath was removed and a standard 5 [place] vascular sheath was placed. The dilator was removed and the sheath was flushed. Bentson wire was advanced to the aortic arch, and a pigtail catheter was passed over the wire to the aortic arch. Thoracic aortic angiogram was performed. Pigtail catheter was exchanged for a Mickelson catheter which was used to select multiple segmental vessels of the thoracic arch. Angiogram was performed of each vessel selected. Once the right bronchial artery was engaged, angiogram was performed. Micro catheter system including a high-flow Renegade catheter was passed into the right bronchial artery, at a safe distance from the origin to avoid refluxing into the aorta. Empiric embolization was then performed with 500 - 700 micro meter embospheres. One vial was used. Micro catheter system was then removed, and a final angiogram was performed through the base catheter. Mickelson catheter was then used to select multiple segmental vessels searching for left bronchial artery. Exchange was made over the Bentson wire for a shunt catheter. The Chung catheter was successful with engaging the origin of left bronchial artery. Angiogram performed. Micro catheter system was then advanced into the left bronchial artery for empiric embolization. Five hundred - 700 micro meter embospheres were used. One vial used. Repeat angiogram was performed. Angiogram of the right common femoral artery was performed. Exoseal was deployed. Patient tolerated the procedure well and  remained hemodynamically stable throughout. No complications were encountered and no significant blood loss encountered. FINDINGS: Thoracic aorta demonstrates no dissection flap or aneurysm. No significant atherosclerotic disease along the lumen of the aorta. Multiple segmental vessels identified including 2 right bronchial artery and single left bronchial artery from the aortic angiogram. Superior right bronchial artery angiogram: Tortuous vasculature without stenosis at the origin. Vessel is of large caliber and supplies a cluster of abnormal vessels at the apex of the lung. Inflammatory tissue at the lung apex a demonstrates dense enhancement with no early draining vein identified. No evidence of arterial contribution towards the spinal canal. Status post microsphere embolization of the right superior bronchial artery there is no significant residual blood flow. Angiogram right supreme intercostal artery angiogram: No contribution to abnormal lung tissue. Angiogram right superior intercostal angiogram: No contribution to the lung apex. Left T9 segmental vessel angiogram: No contribution to the abnormal lung tissue. No reticular medullary vessel identified. Right inferior bronchial artery angiogram: No contribution to the abnormal vasculature at the right apex. Left T8 segmental vessel angiogram: No contribution to abnormal left-sided lung tissue. Left bronchial artery angiogram: Circuitous vasculature with significant contribution to abnormal lung tissue at the left apex. There is dense opacification of inflammatory tissue at the left apex. No extravasation is identified. No contribution of vasculature directed towards the spinal canal. Status post empiric embolization with 500-700 embospheres, no significant contribution of the left bronchial artery to the left apex. IMPRESSION: Status post thoracic aortic and segmental vessel angiogram with empiric embolization of left bronchial artery and superior right  bronchial artery, each contributing significant blood flow to abnormal enhancing inflammatory tissue at the left and right apex, respectively. Both artery were embolized to stasis, with no significant opacification of abnormal tissue at the completion. Status post Exoseal deployment. Signed, Dulcy Fanny. Earleen Newport, DO [facility] Radiology Electronically Signed   By: Corrie Mckusick D.O.   On: 10/13/2015 19:21   Ir Angiogram Follow Up Study  10/13/2015  CLINICAL DATA:  39 year old male with a history of chronic pulmonary infection with MAC, as  well as prior resection for Aspergilloma. He has developed hemoptysis, with significant bleeding on the prior night. Bronchoscopy could not be performed because of concerns for the patient's pulmonary status. Bilateral empiric embolization is planned. EXAM: ULTRASOUND GUIDED ACCESS RIGHT COMMON FEMORAL ARTERY THORACIC AORTIC ANGIOGRAM SELECTIVE ANGIOGRAM OF MULTIPLE BILATERAL THORACIC SEGMENTAL VESSELS INCLUDING BILATERAL BRONCHIAL ARTERIES. 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 Date:  11/24/201611/24/2016 3:52 pm FLUOROSCOPY TIME:  22 minutes, 24 seconds MEDICATIONS AND MEDICAL HISTORY: 1.0 mg Versed, 50 mcg fentanyl ANESTHESIA/SEDATION: 73 minutes CONTRAST:  262m OMNIPAQUE IOHEXOL 300 MG/ML  SOLN COMPLICATIONS: None PROCEDURE: Informed consent was obtained from the patient following explanation of the procedure, risks, benefits and alternatives. Specific risks include bleeding, infection, arterial injury, need for further surgery or procedure, kidney injury, contrast reaction, non targeted embolization, neurologic injury/ deficit, cardiopulmonary collapse, death The patient understands, agrees and consents for the procedure. All questions were addressed. A time out was performed. Patient is position  in the supine position on the fluoroscopy table. The right inguinal region was prepped and draped in the usual sterile fashion. Maximum sterile protection was used including gown and gloves mask. 1% lidocaine was used for local anesthesia. Ultrasound survey of the right inguinal region was performed with images stored and sent to PACs. A micropuncture needle was used access the right common femoral artery under ultrasound. With excellent arterial blood flow returned, and an .018 micro wire was passed through the needle, observed enter the abdominal aorta under fluoroscopy. The needle was removed, and a micropuncture sheath was placed over the wire. The inner dilator and wire were removed, and an 035 Bentson wire was advanced under fluoroscopy into the abdominal aorta. The sheath was removed and a standard 5 [place] sheath was placed. The dilator was removed and the sheath was flushed. Bentson wire was advanced to the aortic arch, and a pigtail catheter was passed over the wire to the aortic arch. Thoracic aortic angiogram was performed. Pigtail catheter was exchanged for a Mickelson catheter which was used to select multiple segmental vessels of the thoracic arch. Angiogram was performed of each vessel selected. Once the right bronchial artery was engaged, angiogram was performed. Micro catheter system including a high-flow Renegade catheter was passed into the right bronchial artery, at a safe distance from the origin to avoid refluxing into the aorta. Empiric embolization was then performed with 500 - 700 micro meter embospheres. One vial was used. Micro catheter system was then removed, and a final angiogram was performed through the base catheter. Mickelson catheter was then used to select multiple segmental vessels searching for left bronchial artery. Exchange was made over the Bentson wire for a shunt catheter. The Chung catheter was successful with engaging the origin of left bronchial artery.  Angiogram performed. Micro catheter system was then advanced into the left bronchial artery for empiric embolization. Five hundred - 700 micro meter embospheres were used. One vial used. Repeat angiogram was performed. Angiogram of the right common femoral artery was performed. Exoseal was deployed. Patient tolerated the procedure well and remained hemodynamically stable throughout. No complications were encountered and no significant blood loss encountered. FINDINGS: Thoracic aorta demonstrates no dissection flap or aneurysm. No significant atherosclerotic disease along the lumen of the aorta. Multiple segmental vessels identified including 2 right bronchial artery and single left bronchial artery from the aortic angiogram. Superior right bronchial artery angiogram: Tortuous vasculature without stenosis  at the origin. Vessel is of large caliber and supplies a cluster of abnormal vessels at the apex of the lung. Inflammatory tissue at the lung apex a demonstrates dense enhancement with no early draining vein identified. No evidence of arterial contribution towards the spinal canal. Status post microsphere embolization of the right superior bronchial artery there is no significant residual blood flow. Angiogram right supreme intercostal artery angiogram: No contribution to abnormal lung tissue. Angiogram right superior intercostal angiogram: No contribution to the lung apex. Left T9 segmental vessel angiogram: No contribution to the abnormal lung tissue. No reticular medullary vessel identified. Right inferior bronchial artery angiogram: No contribution to the abnormal vasculature at the right apex. Left T8 segmental vessel angiogram: No contribution to abnormal left-sided lung tissue. Left bronchial artery angiogram: Circuitous vasculature with significant contribution to abnormal lung tissue at the left apex. There is dense opacification of inflammatory tissue at the left apex. No extravasation is identified. No  contribution of vasculature directed towards the spinal canal. Status post empiric embolization with 500-700 embospheres, no significant contribution of the left bronchial artery to the left apex. IMPRESSION: Status post thoracic aortic and segmental vessel angiogram with empiric embolization of left bronchial artery and superior right bronchial artery, each contributing significant blood flow to abnormal enhancing inflammatory tissue at the left and right apex, respectively. Both artery were embolized to stasis, with no significant opacification of abnormal tissue at the completion. Status post Exoseal deployment. Signed, Dulcy Fanny. Earleen Newport, DO [facility] Radiology Electronically Signed   By: Corrie Mckusick D.O.   On: 10/13/2015 19:21   Ir Angiogram Follow Up Study  10/13/2015  CLINICAL DATA:  39 year old male with a history of chronic pulmonary infection with MAC, as well as prior resection for Aspergilloma. He has developed hemoptysis, with significant bleeding on the prior night. Bronchoscopy could not be performed because of concerns for the patient's pulmonary status. Bilateral empiric embolization is planned. EXAM: ULTRASOUND GUIDED ACCESS RIGHT COMMON FEMORAL ARTERY THORACIC AORTIC ANGIOGRAM SELECTIVE ANGIOGRAM OF MULTIPLE BILATERAL THORACIC SEGMENTAL VESSELS INCLUDING BILATERAL BRONCHIAL ARTERIES. 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 Date:  11/24/201611/24/2016 3:52 pm FLUOROSCOPY TIME:  22 minutes, 24 seconds MEDICATIONS AND MEDICAL HISTORY: 1.0 mg Versed, 50 mcg fentanyl ANESTHESIA/SEDATION: 73 minutes CONTRAST:  241m OMNIPAQUE IOHEXOL 300 MG/ML  SOLN COMPLICATIONS: None PROCEDURE: Informed consent was obtained from the patient following explanation of the procedure, risks, benefits and alternatives. Specific risks include  bleeding, infection, arterial injury, need for further surgery or procedure, kidney injury, contrast reaction, non targeted embolization, neurologic injury/ deficit, cardiopulmonary collapse, death The patient understands, agrees and consents for the procedure. All questions were addressed. A time out was performed. Patient is position in the supine position on the fluoroscopy table. The right inguinal region was prepped and draped in the usual sterile fashion. Maximum sterile protection was used including gown and gloves mask. 1% lidocaine was used for local anesthesia. Ultrasound survey of the right inguinal region was performed with images stored and sent to PACs. A micropuncture needle was used access the right common femoral artery under ultrasound. With excellent arterial blood flow returned, and an .018 micro wire was passed through the needle, observed enter the abdominal aorta under fluoroscopy. The needle was removed, and a micropuncture sheath was placed over the wire. The inner dilator and wire were removed, and an 035 Bentson wire was advanced under  fluoroscopy into the abdominal aorta. The sheath was removed and a standard 5 [place] vascular sheath was placed. The dilator was removed and the sheath was flushed. Bentson wire was advanced to the aortic arch, and a pigtail catheter was passed over the wire to the aortic arch. Thoracic aortic angiogram was performed. Pigtail catheter was exchanged for a Mickelson catheter which was used to select multiple segmental vessels of the thoracic arch. Angiogram was performed of each vessel selected. Once the right bronchial artery was engaged, angiogram was performed. Micro catheter system including a high-flow Renegade catheter was passed into the right bronchial artery, at a safe distance from the origin to avoid refluxing into the aorta. Empiric embolization was then performed with 500 - 700 micro meter embospheres. One vial was used. Micro catheter system was  then removed, and a final angiogram was performed through the base catheter. Mickelson catheter was then used to select multiple segmental vessels searching for left bronchial artery. Exchange was made over the Bentson wire for a shunt catheter. The Chung catheter was successful with engaging the origin of left bronchial artery. Angiogram performed. Micro catheter system was then advanced into the left bronchial artery for empiric embolization. Five hundred - 700 micro meter embospheres were used. One vial used. Repeat angiogram was performed. Angiogram of the right common femoral artery was performed. Exoseal was deployed. Patient tolerated the procedure well and remained hemodynamically stable throughout. No complications were encountered and no significant blood loss encountered. FINDINGS: Thoracic aorta demonstrates no dissection flap or aneurysm. No significant atherosclerotic disease along the lumen of the aorta. Multiple segmental vessels identified including 2 right bronchial artery and single left bronchial artery from the aortic angiogram. Superior right bronchial artery angiogram: Tortuous vasculature without stenosis at the origin. Vessel is of large caliber and supplies a cluster of abnormal vessels at the apex of the lung. Inflammatory tissue at the lung apex a demonstrates dense enhancement with no early draining vein identified. No evidence of arterial contribution towards the spinal canal. Status post microsphere embolization of the right superior bronchial artery there is no significant residual blood flow. Angiogram right supreme intercostal artery angiogram: No contribution to abnormal lung tissue. Angiogram right superior intercostal angiogram: No contribution to the lung apex. Left T9 segmental vessel angiogram: No contribution to the abnormal lung tissue. No reticular medullary vessel identified. Right inferior bronchial artery angiogram: No contribution to the abnormal vasculature at the  right apex. Left T8 segmental vessel angiogram: No contribution to abnormal left-sided lung tissue. Left bronchial artery angiogram: Circuitous vasculature with significant contribution to abnormal lung tissue at the left apex. There is dense opacification of inflammatory tissue at the left apex. No extravasation is identified. No contribution of vasculature directed towards the spinal canal. Status post empiric embolization with 500-700 embospheres, no significant contribution of the left bronchial artery to the left apex. IMPRESSION: Status post thoracic aortic and segmental vessel angiogram with empiric embolization of left bronchial artery and superior right bronchial artery, each contributing significant blood flow to abnormal enhancing inflammatory tissue at the left and right apex, respectively. Both artery were embolized to stasis, with no significant opacification of abnormal tissue at the completion. Status post Exoseal deployment. Signed, Dulcy Fanny. Earleen Newport, DO [facility] Electronically Signed   By: Corrie Mckusick D.O.   On: 10/13/2015 19:21   Ir US Guide Vasc Access Right  10/13/2015  CLINICAL DATA:  39 year old male with a history of chronic pulmonary infection with MAC,  as well as prior resection for Aspergilloma. He has developed hemoptysis, with significant bleeding on the prior night. Bronchoscopy could not be performed because of concerns for the patient's pulmonary status. Bilateral empiric embolization is planned. EXAM: ULTRASOUND GUIDED ACCESS RIGHT COMMON FEMORAL ARTERY THORACIC AORTIC ANGIOGRAM SELECTIVE ANGIOGRAM OF MULTIPLE BILATERAL THORACIC SEGMENTAL VESSELS INCLUDING BILATERAL BRONCHIAL ARTERIES. 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 Date:  11/24/201611/24/2016 3:52 pm FLUOROSCOPY TIME:  22  minutes, 24 seconds MEDICATIONS AND MEDICAL HISTORY: 1.0 mg Versed, 50 mcg fentanyl ANESTHESIA/SEDATION: 73 minutes CONTRAST:  247m OMNIPAQUE IOHEXOL 300 MG/ML  SOLN COMPLICATIONS: None PROCEDURE: Informed consent was obtained from the patient following explanation of the procedure, risks, benefits and alternatives. Specific risks include bleeding, infection, arterial injury, need for further surgery or procedure, kidney injury, contrast reaction, non targeted embolization, neurologic injury/ deficit, cardiopulmonary collapse, death The patient understands, agrees and consents for the procedure. All questions were addressed. A time out was performed. Patient is position in the supine position on the fluoroscopy table. The right inguinal region was prepped and draped in the usual sterile fashion. Maximum sterile protection was used including gown and gloves mask. 1% lidocaine was used for local anesthesia. Ultrasound survey of the right inguinal region was performed with images stored and sent to PACs. A micropuncture needle was used access the right common femoral artery under ultrasound. With excellent arterial blood flow returned, and an .018 micro wire was passed through the needle, observed enter the abdominal aorta under fluoroscopy. The needle was removed, and a micropuncture sheath was placed over the wire. The inner dilator and wire were removed, and an 035 Bentson wire was advanced under fluoroscopy into the abdominal aorta. The sheath was removed and a standard 5 [place] sheath was placed. The dilator was removed and the sheath was flushed. Bentson wire was advanced to the aortic arch, and a pigtail catheter was passed over the wire to the aortic arch. Thoracic aortic angiogram was performed. Pigtail catheter was exchanged for a Mickelson catheter which was used to select multiple segmental vessels of the thoracic arch. Angiogram was performed of each vessel selected. Once the right bronchial  artery was engaged, angiogram was performed. Micro catheter system including a high-flow Renegade catheter was passed into the right bronchial artery, at a safe distance from the origin to avoid refluxing into the aorta. Empiric embolization was then performed with 500 - 700 micro meter embospheres. One vial was used. Micro catheter system was then removed, and a final angiogram was performed through the base catheter. Mickelson catheter was then used to select multiple segmental vessels searching for left bronchial artery. Exchange was made over the Bentson wire for a shunt catheter. The Chung catheter was successful with engaging the origin of left bronchial artery. Angiogram performed. Micro catheter system was then advanced into the left bronchial artery for empiric embolization. Five hundred - 700 micro meter embospheres were used. One vial used. Repeat angiogram was performed. Angiogram of the right common femoral artery was performed. Exoseal was deployed. Patient tolerated the procedure well and remained hemodynamically stable throughout. No complications were encountered and no significant blood loss encountered. FINDINGS: Thoracic aorta demonstrates no dissection flap or aneurysm. No significant atherosclerotic disease along the lumen of the aorta. Multiple segmental vessels identified including 2 right bronchial artery and single left bronchial artery from the aortic angiogram. Superior right bronchial artery angiogram: Tortuous vasculature without  stenosis at the origin. Vessel is of large caliber and supplies a cluster of abnormal vessels at the apex of the lung. Inflammatory tissue at the lung apex a demonstrates dense enhancement with no early draining vein identified. No evidence of arterial contribution towards the spinal canal. Status post microsphere embolization of the right superior bronchial artery there is no significant residual blood flow. Angiogram right supreme intercostal artery  angiogram: No contribution to abnormal lung tissue. Angiogram right superior intercostal angiogram: No contribution to the lung apex. Left T9 segmental vessel angiogram: No contribution to the abnormal lung tissue. No reticular medullary vessel identified. Right inferior bronchial artery angiogram: No contribution to the abnormal vasculature at the right apex. Left T8 segmental vessel angiogram: No contribution to abnormal left-sided lung tissue. Left bronchial artery angiogram: Circuitous vasculature with significant contribution to abnormal lung tissue at the left apex. There is dense opacification of inflammatory tissue at the left apex. No extravasation is identified. No contribution of vasculature directed towards the spinal canal. Status post empiric embolization with 500-700 embospheres, no significant contribution of the left bronchial artery to the left apex. IMPRESSION: Status post thoracic aortic and segmental vessel angiogram with empiric embolization of left bronchial artery and superior right bronchial artery, each contributing significant blood flow to abnormal enhancing inflammatory tissue at the left and right apex, respectively. Both artery were embolized to stasis, with no significant opacification of abnormal tissue at the completion. Status post Exoseal deployment. Signed, Dulcy Fanny. Earleen Newport, DO [facility] Electronically Signed   By: Corrie Mckusick D.O.   On: 10/13/2015 19:21   Dg Chest Port 1 View  10/14/2015  CLINICAL DATA:  Hemoptysis, mycobacterium avium intracellular complex. EXAM: PORTABLE CHEST 1 VIEW COMPARISON:  October 13, 2015. FINDINGS: Stable cardiomediastinal silhouette. Bilateral upper lobe bulla are again noted and unchanged. Associated scarring in the both upper lobes is noted. Opacity is again noted in the left upper lobe most likely representing fungus ball. No significant pleural effusion is noted. Bony thorax is  unremarkable. IMPRESSION: Stable bilateral bulla formation and scarring is noted in both upper lobes. Soft tissue density is again noted in left upper lobe most likely representing fungus ball as described on prior CT scan. No significant changes noted compared to prior exam. Electronically Signed   By: Marijo Conception, M.D.   On: 10/14/2015 07:41   Dg Chest Portable 1 View  10/13/2015  CLINICAL DATA:  Acute onset of hemoptysis and shortness of breath. Initial encounter. EXAM: PORTABLE CHEST 1 VIEW COMPARISON:  Chest radiograph performed 09/28/2014 FINDINGS: There appears to be an enlarging 3.9 cm mass near the left lung apex. Biapical pleural parenchymal scarring and bullous change are again seen, with somewhat worsening bilateral pulmonary nodularity. Pleural thickening near the left lung mass appears worsened, raising concern for local spread of disease. No pleural effusion or pneumothorax is seen. The cardiomediastinal silhouette is normal in size. No acute osseous abnormalities are identified. A chronic left fourth rib deformity is noted. IMPRESSION: 1. Apparent enlarging 3.9 cm mass near the left lung apex, concerning for malignancy. Pleural thickening near the left lung mass appears worsened, raising concern for local spread of disease. CT of the chest would be helpful for further evaluation, when and as deemed clinically appropriate. 2. Somewhat worsening bilateral pulmonary nodularity noted. Biapical pleural parenchymal scarring and bullous change again noted. Electronically Signed   By: Garald Balding M.D.   On: 10/13/2015 05:22   Ir Ileana Ladd Art  [place] Guide Roadmapping  10/13/2015  CLINICAL DATA:  39 year old male with a history of chronic pulmonary infection with MAC, as well as prior resection for Aspergilloma. He has developed hemoptysis, with significant bleeding on the prior night. Bronchoscopy could not be performed because of concerns for the patient's pulmonary status.  Bilateral empiric embolization is planned. EXAM: ULTRASOUND GUIDED ACCESS RIGHT COMMON FEMORAL ARTERY THORACIC AORTIC ANGIOGRAM SELECTIVE ANGIOGRAM OF MULTIPLE BILATERAL THORACIC SEGMENTAL VESSELS INCLUDING BILATERAL BRONCHIAL ARTERIES. 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 Date:  11/24/201611/24/2016 3:52 pm FLUOROSCOPY TIME:  22 minutes, 24 seconds MEDICATIONS AND MEDICAL HISTORY: 1.0 mg Versed, 50 mcg fentanyl ANESTHESIA/SEDATION: 73 minutes CONTRAST:  265m OMNIPAQUE IOHEXOL 300 MG/ML  SOLN COMPLICATIONS: None PROCEDURE: Informed consent was obtained from the patient following explanation of the procedure, risks, benefits and alternatives. Specific risks include bleeding, infection, arterial injury, need for further surgery or procedure, kidney injury, contrast reaction, non targeted embolization, neurologic injury/ deficit, cardiopulmonary collapse, death The patient understands, agrees and consents for the procedure. All questions were addressed. A time out was performed. Patient is position in the supine position on the fluoroscopy table. The right inguinal region was prepped and draped in the usual sterile fashion. Maximum sterile protection was used including gown and gloves mask. 1% lidocaine was used for local anesthesia. Ultrasound survey of the right inguinal region was performed with images stored and sent to PACs. A micropuncture needle was used access the right common femoral artery under ultrasound. With excellent arterial blood flow returned, and an .018 micro wire was passed through the needle, observed enter the abdominal aorta under fluoroscopy. The needle was removed, and a micropuncture sheath was placed over the wire. The inner dilator and wire were removed, and an 035 Bentson wire was advanced under fluoroscopy into the abdominal aorta. The sheath was removed  and a standard 5 [place] sheath was placed. The dilator was removed and the sheath was flushed. Bentson wire was advanced to the aortic arch, and a pigtail catheter was passed over the wire to the aortic arch. Thoracic aortic angiogram was performed. Pigtail catheter was exchanged for a Mickelson catheter which was used to select multiple segmental vessels of the thoracic arch. Angiogram was performed of each vessel selected. Once the right bronchial artery was engaged, angiogram was performed. Micro catheter system including a high-flow Renegade catheter was passed into the right bronchial artery, at a safe distance from the origin to avoid refluxing into the aorta. Empiric embolization was then performed with 500 - 700 micro meter embospheres. One vial was used. Micro catheter system was then removed, and a final angiogram was performed through the base catheter. Mickelson catheter was then used to select multiple segmental vessels searching for left bronchial artery. Exchange was made over the Bentson wire for a shunt catheter. The Chung catheter was successful with engaging the origin of left bronchial artery. Angiogram performed. Micro catheter system was then advanced into the left bronchial artery for empiric embolization. Five hundred - 700 micro meter embospheres were used. One vial used. Repeat angiogram was performed. Angiogram of the right common femoral artery was performed. Exoseal was deployed. Patient tolerated the procedure well and remained hemodynamically stable throughout. No complications were encountered and no significant blood loss encountered. FINDINGS: Thoracic aorta demonstrates no dissection flap or aneurysm. No significant atherosclerotic disease along the lumen of the aorta. Multiple  segmental vessels identified including 2 right bronchial artery and single left bronchial artery from the aortic angiogram. Superior right bronchial artery angiogram: Tortuous vasculature without  stenosis at the origin. Vessel is of large caliber and supplies a cluster of abnormal vessels at the apex of the lung. Inflammatory tissue at the lung apex a demonstrates dense enhancement with no early draining vein identified. No evidence of arterial contribution towards the spinal canal. Status post microsphere embolization of the right superior bronchial artery there is no significant residual blood flow. Angiogram right supreme intercostal artery angiogram: No contribution to abnormal lung tissue. Angiogram right superior intercostal angiogram: No contribution to the lung apex. Left T9 segmental vessel angiogram: No contribution to the abnormal lung tissue. No reticular medullary vessel identified. Right inferior bronchial artery angiogram: No contribution to the abnormal vasculature at the right apex. Left T8 segmental vessel angiogram: No contribution to abnormal left-sided lung tissue. Left bronchial artery angiogram: Circuitous vasculature with significant contribution to abnormal lung tissue at the left apex. There is dense opacification of inflammatory tissue at the left apex. No extravasation is identified. No contribution of vasculature directed towards the spinal canal. Status post empiric embolization with 500-700 embospheres, no significant contribution of the left bronchial artery to the left apex. IMPRESSION: Status post thoracic aortic and segmental vessel angiogram with empiric embolization of left bronchial artery and superior right bronchial artery, each contributing significant blood flow to abnormal enhancing inflammatory tissue at the left and right apex, respectively. Both artery were embolized to stasis, with no significant opacification of abnormal tissue at the completion. Status post Exoseal deployment. Signed, Dulcy Fanny. Earleen Newport, DO [facility] Electronically Signed   By: Corrie Mckusick D.O.   On: 10/13/2015 19:21   [place] Guide Roadmapping  10/13/2015  CLINICAL DATA:  39 year old male with a history of chronic pulmonary infection with MAC, as well as prior resection for Aspergilloma. He has developed hemoptysis, with significant bleeding on the prior night. Bronchoscopy could not be performed because of concerns for the patient's pulmonary status. Bilateral empiric embolization is planned. EXAM: ULTRASOUND GUIDED ACCESS RIGHT COMMON FEMORAL ARTERY THORACIC AORTIC ANGIOGRAM SELECTIVE ANGIOGRAM OF MULTIPLE BILATERAL THORACIC SEGMENTAL VESSELS INCLUDING BILATERAL BRONCHIAL ARTERIES. 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 Date:  11/24/201611/24/2016 3:52 pm FLUOROSCOPY TIME:  22 minutes, 24 seconds MEDICATIONS AND MEDICAL HISTORY: 1.0 mg Versed, 50 mcg fentanyl ANESTHESIA/SEDATION: 73 minutes CONTRAST:  231m OMNIPAQUE IOHEXOL 300 MG/ML  SOLN COMPLICATIONS: None PROCEDURE: Informed consent was obtained from the patient following explanation of the procedure, risks, benefits and alternatives. Specific risks include bleeding, infection, arterial injury, need for further surgery or procedure, kidney injury, contrast reaction, non targeted embolization, neurologic injury/ deficit, cardiopulmonary collapse, death The patient understands, agrees and consents for the procedure. All questions were addressed. A time out was performed. Patient is position in the supine position on the fluoroscopy table. The right inguinal region was prepped and draped in the usual sterile fashion. Maximum sterile protection was used including gown and gloves mask. 1% lidocaine was used for local anesthesia. Ultrasound survey of the right inguinal region was performed with images stored and sent to PACs. A micropuncture needle was used access the right common femoral artery under ultrasound. With  excellent arterial blood flow returned, and an .018 micro wire was passed through the needle, observed  enter the abdominal aorta under fluoroscopy. The needle was removed, and a micropuncture sheath was placed over the wire. The inner dilator and wire were removed, and an 035 Bentson wire was advanced under fluoroscopy into the abdominal aorta. The sheath was removed and a standard 5 [place] vascular sheath was placed. The dilator was removed and the sheath was flushed. Bentson wire was advanced to the aortic arch, and a pigtail catheter was passed over the wire to the aortic arch. Thoracic aortic angiogram was performed. Pigtail catheter was exchanged for a Mickelson catheter which was used to select multiple segmental vessels of the thoracic arch. Angiogram was performed of each vessel selected. Once the right bronchial artery was engaged, angiogram was performed. Micro catheter system including a high-flow Renegade catheter was passed into the right bronchial artery, at a safe distance from the origin to avoid refluxing into the aorta. Empiric embolization was then performed with 500 - 700 micro meter embospheres. One vial was used. Micro catheter system was then removed, and a final angiogram was performed through the base catheter. Mickelson catheter was then used to select multiple segmental vessels searching for left bronchial artery. Exchange was made over the Bentson wire for a shunt catheter. The Chung catheter was successful with engaging the origin of left bronchial artery. Angiogram performed. Micro catheter system was then advanced into the left bronchial artery for empiric embolization. Five hundred - 700 micro meter embospheres were used. One vial used. Repeat angiogram was performed. Angiogram of the right common femoral artery was performed. Exoseal was deployed. Patient tolerated the procedure well and remained hemodynamically stable throughout. No complications were encountered and no significant  blood loss encountered. FINDINGS: Thoracic aorta demonstrates no dissection flap or aneurysm. No significant atherosclerotic disease along the lumen of the aorta. Multiple segmental vessels identified including 2 right bronchial artery and single left bronchial artery from the aortic angiogram. Superior right bronchial artery angiogram: Tortuous vasculature without stenosis at the origin. Vessel is of large caliber and supplies a cluster of abnormal vessels at the apex of the lung. Inflammatory tissue at the lung apex a demonstrates dense enhancement with no early draining vein identified. No evidence of arterial contribution towards the spinal canal. Status post microsphere embolization of the right superior bronchial artery there is no significant residual blood flow. Angiogram right supreme intercostal artery angiogram: No contribution to abnormal lung tissue. Angiogram right superior intercostal angiogram: No contribution to the lung apex. Left T9 segmental vessel angiogram: No contribution to the abnormal lung tissue. No reticular medullary vessel identified. Right inferior bronchial artery angiogram: No contribution to the abnormal vasculature at the right apex. Left T8 segmental vessel angiogram: No contribution to abnormal left-sided lung tissue. Left bronchial artery angiogram: Circuitous vasculature with significant contribution to abnormal lung tissue at the left apex. There is dense opacification of inflammatory tissue at the left apex. No extravasation is identified. No contribution of vasculature directed towards the spinal canal. Status post empiric embolization with 500-700 embospheres, no significant contribution of the left bronchial artery to the left apex. IMPRESSION: Status post thoracic aortic and segmental vessel angiogram with empiric embolization of left bronchial artery and superior right bronchial artery, each contributing significant blood flow to abnormal enhancing inflammatory tissue at  the left and right apex, respectively. Both artery were embolized to stasis, with no significant opacification of abnormal tissue at the completion. Status post Exoseal deployment. Signed, Dulcy Fanny. Earleen Newport, DO [facility] Electronically Signed   By:  Corrie Mckusick D.O.   On: 10/13/2015 19:21    Thurnell Lose, MD  [facility] Pager:[phone]  If 7PM-7AM, please contact night-coverage [email or web address] Password [email or web address] 10/16/2015, 11:04 AM   LOS: 3 days

## 2015-10-16 NOTE — Progress Notes (Signed)
One induced sputum sent by Respiratory to lab.

## 2015-10-17 DIAGNOSIS — B4481 Allergic bronchopulmonary aspergillosis: Secondary | ICD-10-CM

## 2015-10-17 MED ORDER — ITRACONAZOLE 100 MG PO CAPS
200.0000 mg | ORAL_CAPSULE | Freq: Two times a day (BID) | ORAL | Status: DC
  Administered 2015-10-17 – 2015-10-19 (×5): 200 mg via ORAL
  Filled 2015-10-17 (×8): qty 2

## 2015-10-17 NOTE — Progress Notes (Signed)
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    11/24- Empiric embolization of bilateral bronchial arteries to stasis with 500-700 embospheres. , avoided ett and bronch as risk to not come off vent and ptx  SUBJECTIVE: No more episodes of bleed. AFB is still pending  VITAL SIGNS: BP 101/73 mmHg  Pulse 82  Temp(Src) 98.2 F (36.8 C) (Oral)  Resp 17  Ht _0  (1.753 m)  Wt 133 lb 9.6 oz (60.6 kg)  BMI 19.72 kg/m2  SpO2 95%  HEMODYNAMICS:    VENTILATOR SETTINGS:    INTAKE / OUTPUT: I/O last 3 completed shifts: In: 720 [P.O.:720] Out: -   PHYSICAL EXAMINATION: General:  Thin male, No distress Neuro:  AAO HEENT:  Moist mucus membranes, Cardiovascular:  S1, S2, No MRG Lungs:  Clear, No wheeze or crackles Abdomen:  NTND, + BS Musculoskeletal:  No acute deformities  Skin:  Intact  LABS:  CBC  Recent Labs Lab 10/13/15 0400 10/13/15 0420 10/14/15 0228 10/15/15 0444  WBC 9.7  --  19.0* 12.6*  HGB 15.4 16.0 14.6 13.5  HCT 44.9 47.0 42.9 39.6  PLT 237  --  203 166   Coag's  Recent Labs Lab 10/13/15 0400  APTT 31  INR 0.95   BMET  Recent Labs Lab 10/13/15 0400 10/13/15 0420 10/14/15 0228 10/15/15 0444  NA 139 137 133* 136  K 3.8 3.8 3.6 4.0  CL 104 108 101 105  CO2 28  --  25 24  BUN _1 CREATININE 0.93 0.90 1.00 0.91  GLUCOSE 136* 129* 97 104*   Electrolytes  Recent Labs Lab 10/13/15 0400 10/14/15 0228  10/15/15 0444  CALCIUM 8.4* 8.0* 7.9*   Sepsis Markers  Recent Labs Lab 10/13/15 0416  LATICACIDVEN 1.44   ABG No results for input(s): PHART, PCO2ART, PO2ART in the last 168 hours. Liver Enzymes  Recent Labs Lab 10/13/15 0400  AST 24  ALT 14*  ALKPHOS 96  BILITOT 1.1  ALBUMIN 3.5   Cardiac Enzymes No results for input(s): TROPONINI, PROBNP in the last 168 hours. Glucose  Recent Labs Lab 10/13/15 0932  GLUCAP 78    Imaging    STUDIES:  11/24  CTA Chest >> no PE, new dense layering airspace in L apical bulla concerning for fungal ball, numerous scattered nodules, loculated L sided pneumothorax decreased in size, small R basilar pneumothorax, and large bilateral bullae.   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  S/p bronchial artery embolization for hemoptysis 11/25.   ASSESSMENT / PLAN:  PULMONARY A: Hemoptysis - acute episode 11/24 am, approx 100-200 ml . Bleeding stopped S/p  embolization Bronchial arteries bilateral  Acute Hypoxemia - in setting of hemoptysis  Concern for new Fungal Ball - see 11/24 CT ABPA - on chronic prednisone  MAC - on azithro / ethambutol  P:   Pulmonary hygiene Continue chronic medications - azithromycin, ethambutol, prednisone  Intermittent CXR , follow clinically for bleeding Budesonide / Pulmicort  Spiriva  Q6 PRN Albuterol  Sputum for AFB x 3 -pending Will ask ID to comment if there are any other therapies we can offer for aspergillus. Check voriconazole trough  levels.   CARDIOVASCULAR A:  No acute issues P:   RENAL A:   S/p contrast bilateral BA at risk ATN  P:   Trend BMP / UOP  No lasix  GASTROINTESTINAL A:   Protein Calorie Malnutrition  Weight Loss - approx 10 lbs since 08/2015 P:   Regular dietnow Ensure   HEMATOLOGIC A:   Hemoptyisis, DV tprev P:  Continue home ferrous sulfate  Monitor CBC  SCD's for DVT prophylaxis   INFECTIOUS A:   Concern for new aspergilloma  Hx MAC, ABPA  unlikely TB P:   Follow cultures as above  afb pending  ENDOCRINE A:   Hypothyroidism  P:   Continue synthroid  Assess TSH  - 4  NEUROLOGIC A:   No acute issues  P:   Monitor pain  FAMILY  - Inter-disciplinary family meet or Palliative Care meeting due by: 12/2  Marshell Garfinkel MD [facility] Pager [phone] If no answer or after 3pm call: [phone] 10/17/2015, 10:58 AM

## 2015-10-17 NOTE — Consult Note (Addendum)
[facility] for Infectious Disease         Reason for Consult: presumed aspergilloma   Referring Physician:  feinstein  Principal Problem:   Hemoptysis Active Problems:   ABPA (allergic bronchopulmonary aspergillosis) ([place])   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[place])   Mycobacterium avium infection ([place])   MAC (mycobacterium avium-intracellulare complex)    HPI: [name] is a 39 y.o. 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 he also has  history of aspergilloma status post left upper lobe resection in Feb 2012. Due to ABPA and chronic cough restarted on voriconazole and steroids. He has been intermittently treated for  MAC since Spring (2013) and managed by Dr. Tommy Medal. He last saw him in mid Sept where he was doing ok with his regimen. He was admitted on 11/24 for abrupt onset of hemoptysis that started the night prior to admit where his baseline cough had worsened and started to experience bright red blood in/hemoptysis. He came to the ED for evaluation. CXr shows left apical mass, plueral thickening by the mass, diffuse scattered pulm nodules. He also had CTA which showed concern for new fungal ball. He underwent embolization of bronchial arteries to achieve stasis and no further hemoptysis. ID asked to weigh in to see if need to change regimen or if this could be vori resistance  Past Medical History  Diagnosis Date  . 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[place])   . MAI (mycobacterium avium-intracellulare) ([place])   . Lung disease, bullous ([place])   . Esophageal reflux   . Hypothyroidism     secondary to ablation for Graves disease  . Solar lentigo 08/08/2015    Allergies:  Allergies  Allergen Reactions  . Clarithromycin [Clarithromycin]     Adverse reaction w/ voriconazole  . Rifaximin [Rifaximin]     Adverse reaction w/ voriconazole    MEDICATIONS: . arformoterol  15 mcg Nebulization BID  . azithromycin  500 mg Oral Daily  .  budesonide  0.5 mg Nebulization BID  . ethambutol  1,000 mg Oral Daily  . feeding supplement (ENSURE ENLIVE)  237 mL Oral BID BM  . ferrous sulfate  325 mg Oral Daily  . levothyroxine  50 mcg Oral QAC breakfast  . multivitamin with minerals  1 tablet Oral Daily  . pantoprazole  40 mg Oral Q1200  . predniSONE  10 mg Oral Daily  . tiotropium  18 mcg Inhalation Daily  . voriconazole  300 mg Oral Q12H    Social History  Substance Use Topics  . Smoking status: Former Smoker -- 0.70 packs/day for 19 years    Types: Cigarettes    Quit date: 11/20/2007  . Smokeless tobacco: Former Systems developer    Types: Snuff    Quit date: 11/19/1994  . Alcohol Use: No    Family History  Problem Relation Age of Onset  . Adopted: Yes    Review of Systems  Constitutional: + mild weight loss of 8 lb over last 2 wk. Negative for fever, chills, diaphoresis, activity change, appetite change, fatigue  HENT: Negative for congestion, sore throat, rhinorrhea, sneezing, trouble swallowing and sinus pressure.  Eyes: Negative for photophobia and visual disturbance.  Respiratory: +hemoptysis, now resolved. + chronic cough  But no chest tightness, shortness of breath, wheezing and stridor.  Cardiovascular: Negative for chest pain, palpitations and leg swelling.  Gastrointestinal: Negative for nausea, vomiting, abdominal pain, diarrhea, constipation, blood in stool, abdominal distention and anal bleeding.  Genitourinary: Negative for dysuria, hematuria, flank pain and difficulty urinating.  Musculoskeletal: Negative for myalgias, back pain, joint swelling, arthralgias and gait problem.  Skin: Negative for color change, pallor, rash and wound.  Neurological: Negative for dizziness, tremors, weakness and light-headedness.  Hematological: Negative for adenopathy. Does not bruise/bleed easily.  Psychiatric/Behavioral: Negative for behavioral problems, confusion, sleep disturbance, dysphoric mood, decreased concentration and  agitation.     OBJECTIVE: Temp:  [98.2 F (36.8 C)-98.4 F (36.9 C)] 98.2 F (36.8 C) (11/28 0543) Pulse Rate:  [76-82] 82 (11/28 0543) Resp:  [16-17] 17 (11/28 0543) BP: (101-119)/(73-77) 101/73 mmHg (11/28 0543) SpO2:  [95 %-99 %] 95 % (11/28 0543) Physical Exam  Constitutional: He is oriented to person, place, and time. He appears well-developed and well-nourished. No distress.  HENT:  Mouth/Throat: Oropharynx is clear and moist. No oropharyngeal exudate.  Cardiovascular: Normal rate, regular rhythm and normal heart sounds. Exam reveals no gallop and no friction rub.  No murmur heard.  Pulmonary/Chest: Effort normal and breath sounds normal. No respiratory distress. He has no wheezes.  Back: kyphosis Abdominal: Soft. Bowel sounds are normal. He exhibits no distension. There is no tenderness.  Lymphadenopathy:  He has no cervical adenopathy.  Neurological: He is alert and oriented to person, place, and time.  Skin: Skin is warm and dry. Hyperpigmentation to forearms from sun exposure and tattoos. Psychiatric: He has a normal mood and affect. His behavior is normal.    LABS: Results for orders placed or performed during the hospital encounter of 10/13/15 (from the past 48 hour(s))  AFB culture with smear     Status: None (Preliminary result)   Collection Time: 10/16/15  2:27 PM  Result Value Ref Range   Specimen Description SPUTUM    Special Requests NONE    Acid Fast Smear      NO ACID FAST BACILLI SEEN Performed at [facility] 6 WEEKS BEFORE ISSUING A FINAL REPORT Performed at [facility]    Report Status PENDING     MICRO: 11/24 sputum cx NGTD, no afb 11/27 afb pending 11/27 afb pending  IMAGING: chest CTA 11/24:  . New dense layering airspace opacification at the patient's large left apical bulla, extending inferiorly along the posterior aspect of the left upper lobe. This is suspicious for  acute fungal infection (i.e., a fungal ball), given its appearance. 3. 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 4. Previously noted loculated left-sided pneumothorax has decreased significantly in size. 5. New small right basilar pneumothorax noted. 6. Underlying large bilateral bullae again noted. 7. Suggestion of mildly prominent mediastinal nodes.   Assessment/Plan: 39yo M with bullous lung disease with ABPA on vori plus pred, hx of aspergilloma with lung resection, and current treated for MAC with azithromycin and EMB, admitted for new onset hemoptysis s/p embolization and found to have new Left upper lobe lesion concerning for recurrent aspergilloma  - agree with getting voriconazole level - would d/c PPI since he doesn't suffer for GERD and can potentially affect vori absorption - the question to whether he has voriconazole resistant aspergilloma as this developed while on voricoanzole. Patient has not had any recent imaging in the last year per his report. - for now, we will change him itraconazole. Will check in also to get opinion for Dr. Cassandria Santee at [facility] - need to follow up on AFB smear results  ABPA= continue with low dose steroids  Hemoptysis = appears resolved since embolization. Continue to monitor  For MAC: continue on current regimen of azithromycin and ethambutol

## 2015-10-17 NOTE — Care Management Note (Signed)
Case Management Note  [name] MRN: [id number] Date of Birth: August 06, 1976  Subjective/Objective:                    Action/Plan:  UR updated  Expected Discharge Date:                  Expected Discharge Plan:  Home/Self Care  In-House Referral:     Discharge planning Services     Post Acute Care Choice:    Choice offered to:     DME Arranged:    DME Agency:     HH Arranged:    [place] Agency:     Status of Service:  In process, will continue to follow  Medicare Important Message Given:    Date Medicare IM Given:    Medicare IM give by:    Date Additional Medicare IM Given:    Additional Medicare Important Message give by:     If discussed at [place] of Stay Meetings, dates discussed:    Additional Comments:  Marilu Favre, RN 10/17/2015, 11:31 AM

## 2015-10-17 NOTE — Progress Notes (Signed)
PATIENT DETAILS Name: [name] Age: 39 y.o. 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 CT angiogram of the chest was positive for aspergilloma in the left upper lung bulla. Felt to be high risk for pneumothorax/VDRF if bronchoscopy performed-hence IR was consulted for embolization-no further hemoptysis since then. Transferred to hospitalists service on 11/26.  Subjective:  In bed, denies any fever or chills, no chest or abdominal pain. No further hemoptysis. No night sweats feels better.  Assessment/Plan:  Hemoptysis: Felt to be secondary to aspergillosis (fungal ball seen on CT chest 11/24). Felt to be high risk for pneumothorax/VDRF if underwent bronchoscopy-hence IR consulted and patient underwent empiric embolization of bilateral bronchial arteries. No further recurrence of hemoptysis. Sputum AFB 1 negative-but need 2 more negative samples prior to discontinuing isolation. Continue voriconazole (taking prior to admission-on lifelong voriconazole). PCCM following-await further recommendations.   Acute hypoxic respiratory failure: Resolved. Secondary to above, CTA chest negative for PE.  History of aspergilloma status post VATS 2012: On chronic lifelong voriconazole-followed at the infectious disease clinic. Concern for recurrent infection causing hemoptysis-see above.  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 Reviewed outpatient ID notes (Dr. Lucianne Lei dam 08/08/15)-unfortunately patient has recurrence of  symptoms when Zithromax/ethambutol discontinued. Follow with ID at next appointment.  Bullous lung disease: Stable.  Hypothyroidism: Continue Synthroid  GERD: Continue PPI  Severe protein calorie malnutrition: Continue supplements     Disposition: Remain inpatient-home when cleared by pulmonology    Antimicrobial agents  See below  Anti-infectives    Start     Dose/Rate Route Frequency Ordered Stop   10/14/15 1000  azithromycin (ZITHROMAX) tablet 500 mg     500 mg Oral Daily 10/14/15 0836     10/13/15 1500  ethambutol (MYAMBUTOL) tablet 1,000 mg     1,000 mg Oral Daily 10/13/15 1057     10/13/15 1000  voriconazole (VFEND) tablet 300 mg     300 mg Oral Every 12 hours 10/13/15 0945     10/13/15 0800  voriconazole (VFEND) 380 mg in sodium chloride 0.9 % 100 mL IVPB  Status:  Discontinued     6 mg/kg  63 kg 69 mL/hr over 120 Minutes Intravenous Every 12 hours 10/13/15 0638 10/13/15 0641   10/13/15 0745  azithromycin (ZITHROMAX) 500 mg in dextrose 5 % 250 mL IVPB     500 mg 250 mL/hr over 60 Minutes Intravenous  Once 10/13/15 0737 10/13/15 0850   10/13/15 0743  azithromycin (ZITHROMAX) 500 MG injection    Comments:  Rolla Etienne   : cabinet override      10/13/15 0743 10/13/15 0750      DVT Prophylaxis:  SCD's  Code Status: Full code  Family Communication None at bedside  Procedures: 11/24>>empiric embolization of bilateral bronchial arteries  CONSULTS:   pulmonary/intensive care and IR  Time spent 30 minutes-Greater than 50% of this time was spent in counseling, explanation of diagnosis, planning of further management, and coordination of care.  MEDICATIONS: Scheduled Meds: . arformoterol  15 mcg Nebulization BID  . azithromycin  500 mg Oral Daily  . budesonide  0.5 mg Nebulization BID  . ethambutol  1,000 mg Oral Daily  . feeding supplement (ENSURE ENLIVE)  237 mL Oral BID BM  . ferrous sulfate  325 mg Oral Daily  . levothyroxine  50 mcg Oral QAC  breakfast  . multivitamin with minerals  1 tablet Oral Daily  . pantoprazole  40 mg Oral Q1200  . predniSONE  10 mg Oral Daily  . tiotropium  18 mcg Inhalation Daily  . voriconazole  300 mg Oral Q12H   Continuous Infusions: . sodium chloride 10 mL/hr at 10/15/15 1617   PRN Meds:.acetaminophen, albuterol    PHYSICAL EXAM:  Vital signs in last 24 hours: Filed Vitals:   10/16/15 1828 10/16/15 2053 10/16/15 2202 10/17/15 0543  BP:   114/76 101/73  Pulse: 79  76 82  Temp:   98.3 F (36.8 C) 98.2 F (36.8 C)  TempSrc:   Oral   Resp: _0 Height:      Weight:      SpO2: 99% 98% 97% 95%    Weight change:  Filed Weights   10/13/15 0600 10/13/15 0922 10/14/15 0327  Weight: 63 kg (138 lb 14.2 oz) 59.3 kg (130 lb 11.7 oz) 60.6 kg (133 lb 9.6 oz)   Body mass index is 19.72 kg/(m^2).   Gen Exam: Awake and alert with clear speech.  Neck: Supple, No JVD.   Chest: B/L Clear.   CVS: S1 S2 Regular, no murmurs.  Abdomen: soft, BS +, non tender, non distended.  Extremities: no edema, lower extremities warm to touch. Neurologic: Non Focal.   Skin: No Rash.   Wounds: N/A.   Intake/Output from previous day:  Intake/Output Summary (Last 24 hours) at 10/17/15 0928 Last data filed at 10/16/15 1700  Gross per 24 hour  Intake    480 ml  Output      0 ml  Net    480 ml     LAB RESULTS: CBC  Recent Labs Lab 10/13/15 0400 10/13/15 0420 10/14/15 0228 10/15/15 0444  WBC 9.7  --  19.0* 12.6*  HGB 15.4 16.0 14.6 13.5  HCT 44.9 47.0 42.9 39.6  PLT 237  --  203 166  MCV 88.4  --  90.3 89.6  MCH 30.3  --  30.7 30.5  MCHC 34.3  --  34.0 34.1  RDW 13.3  --  13.3 13.2  LYMPHSABS 1.9  --   --  1.0  MONOABS 0.4  --   --  0.8  EOSABS 0.1  --   --  0.1  BASOSABS 0.0  --   --  0.0    Chemistries   Recent Labs Lab 10/13/15 0400 10/13/15 0420 10/14/15 0228 10/15/15 0444  NA 139 137 133* 136  K 3.8 3.8 3.6 4.0  CL 104 108 101 105  CO2 28  --  25 24  GLUCOSE 136* 129*  97 104*  BUN _1 CREATININE 0.93 0.90 1.00 0.91  CALCIUM 8.4*  --  8.0* 7.9*    CBG:  Recent Labs Lab 10/13/15 0932  GLUCAP 78    GFR Estimated Creatinine Clearance: 91.6 mL/min (by C-G formula based on Cr of 0.91).  Coagulation profile  Recent Labs Lab 10/13/15 0400  INR 0.95    Cardiac Enzymes No results for input(s): CKMB, TROPONINI, MYOGLOBIN in the last 168 hours.  Invalid input(s): CK  Invalid input(s): POCBNP No results for input(s): DDIMER in the last 72 hours. No results for  input(s): HGBA1C in the last 72 hours. No results for input(s): CHOL, HDL, LDLCALC, TRIG, CHOLHDL, LDLDIRECT in the last 72 hours. No results for input(s): TSH, T4TOTAL, T3FREE, THYROIDAB in the last 72 hours.  Invalid input(s): FREET3 No results for input(s): VITAMINB12, FOLATE, FERRITIN, TIBC, IRON, RETICCTPCT in the last 72 hours. No results for input(s): LIPASE, AMYLASE in the last 72 hours.  Urine Studies No results for input(s): UHGB, CRYS in the last 72 hours.  Invalid input(s): UACOL, UAPR, USPG, UPH, UTP, UGL, UKET, UBIL, UNIT, UROB, ULEU, UEPI, UWBC, URBC, UBAC, CAST, UCOM, BILUA  MICROBIOLOGY: Recent Results (from the past 240 hour(s))  Blood culture (routine x 2)     Status: None (Preliminary result)   Collection Time: 10/13/15  4:30 AM  Result Value Ref Range Status   Specimen Description BLOOD RIGHT ANTECUBITAL  Final   Special Requests BOTTLES DRAWN AEROBIC AND ANAEROBIC 5CC  Final   Culture   Final    NO GROWTH 3 DAYS Performed at [facility]    Report Status PENDING  Incomplete  Blood culture (routine x 2)     Status: None (Preliminary result)   Collection Time: 10/13/15  4:35 AM  Result Value Ref Range Status   Specimen Description BLOOD BLOOD RIGHT FOREARM  Final   Special Requests BOTTLES DRAWN AEROBIC AND ANAEROBIC 5CC  Final   Culture   Final    NO GROWTH 3 DAYS Performed at [facility]    Report Status PENDING  Incomplete   MRSA PCR Screening     Status: None   Collection Time: 10/13/15  9:15 AM  Result Value Ref Range Status   MRSA by PCR NEGATIVE NEGATIVE Final    Comment:        The GeneXpert MRSA Assay (FDA approved for NASAL specimens only), is one component of a comprehensive MRSA colonization surveillance program. It is not intended to diagnose MRSA infection nor to guide or monitor treatment for MRSA infections.   AFB culture with smear     Status: None (Preliminary result)   Collection Time: 10/13/15 12:00 PM  Result Value Ref Range Status   Specimen Description SPUTUM  Final   Special Requests Immunocompromised  Final   Acid Fast Smear   Final    NO ACID FAST BACILLI SEEN Performed at [facility]    Culture   Final    CULTURE WILL BE EXAMINED FOR 6 WEEKS BEFORE ISSUING A FINAL REPORT Performed at [facility]    Report Status PENDING  Incomplete  Culture, expectorated sputum-assessment     Status: None   Collection Time: 10/13/15 12:00 PM  Result Value Ref Range Status   Specimen Description EXPECTORATED SPUTUM  Final   Special Requests Immunocompromised  Final   Sputum evaluation   Final    THIS SPECIMEN IS ACCEPTABLE. RESPIRATORY CULTURE REPORT TO FOLLOW.   Report Status 10/13/2015 FINAL  Final  Culture, respiratory (NON-Expectorated)     Status: None   Collection Time: 10/13/15 12:00 PM  Result Value Ref Range Status   Specimen Description SPUTUM  Final   Special Requests NONE  Final   Gram Stain   Final    ABUNDANT WBC PRESENT,BOTH PMN AND MONONUCLEAR RARE SQUAMOUS EPITHELIAL CELLS PRESENT MODERATE GRAM POSITIVE COCCI IN PAIRS IN CHAINS IN CLUSTERS Performed at [facility]    Culture   Final    NORMAL OROPHARYNGEAL FLORA Performed at [facility]    Report Status 10/16/2015  FINAL  Final  Culture, expectorated sputum-assessment     Status: None   Collection Time: 10/15/15  2:26 AM  Result Value Ref Range Status   Specimen  Description SPUTUM  Final   Special Requests Normal  Final   Sputum evaluation   Final    THIS SPECIMEN IS ACCEPTABLE. RESPIRATORY CULTURE REPORT TO FOLLOW.   Report Status 10/15/2015 FINAL  Final  Culture, respiratory (NON-Expectorated)     Status: None (Preliminary result)   Collection Time: 10/15/15  2:26 AM  Result Value Ref Range Status   Specimen Description EXPECTORATED SPUTUM  Final   Special Requests NONE  Final   Gram Stain   Final    RARE WBC PRESENT, PREDOMINANTLY PMN RARE SQUAMOUS EPITHELIAL CELLS PRESENT MODERATE GRAM POSITIVE COCCI IN PAIRS AND CHAINS Performed at [facility]    Culture PENDING  Incomplete   Report Status PENDING  Incomplete    RADIOLOGY STUDIES/RESULTS: Ct Angio Chest Pe W/cm &/or Wo Cm  10/13/2015  CLINICAL DATA:  Acute onset of hemoptysis, chest tightness and shortness of breath. Initial encounter. EXAM: CT ANGIOGRAPHY CHEST WITH CONTRAST TECHNIQUE: Multidetector CT imaging of the chest was performed using the standard protocol during bolus administration of intravenous contrast. Multiplanar CT image reconstructions and MIPs were obtained to evaluate the vascular anatomy. CONTRAST:  127m OMNIPAQUE IOHEXOL 350 MG/ML SOLN COMPARISON:  Chest radiograph performed earlier today at 4:25 a.m., and CT of the chest performed 03/05/2011 FINDINGS: There is no evidence of pulmonary embolus. The previously noted loculated left-sided pneumothorax has decreased significantly in size. The large bilateral bullae are again noted. There is new dense layering airspace opacification at the large left apical bulla, extending inferiorly along the posterior aspect of the left upper lobe, suspicious for acute fungal infection given its appearance. 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 This is in a slightly different distribution from the prior study, but appears relatively stable. There is  no evidence of pleural effusion. A new small right basilar pneumothorax is noted. Vague borderline prominent aortopulmonary window and subcarinal nodes are suggested, measuring up to 1.1 cm in short axis. The mediastinum is otherwise unremarkable. No pericardial effusion is identified. The great vessels are grossly unremarkable. No axillary lymphadenopathy is seen. The thyroid gland is unremarkable in appearance. The visualized portions of the liver and spleen are unremarkable. The visualized portions of the pancreas, stomach, adrenal glands and left kidney are within normal limits. No acute osseous abnormalities are seen. Review of the MIP images confirms the above findings. IMPRESSION: 1. No evidence of pulmonary embolus. 2. New dense layering airspace opacification at the patient's large left apical bulla, extending inferiorly along the posterior aspect of the left upper lobe. This is suspicious for acute fungal infection (i.e., a fungal ball), given its appearance. 3. 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 4. Previously noted loculated left-sided pneumothorax has decreased significantly in size. 5. New small right basilar pneumothorax noted. 6. Underlying large bilateral bullae again noted. 7. Suggestion of mildly prominent mediastinal nodes. These results were called by telephone at the time of interpretation on 10/13/2015 at 6:05 am to Dr. KPryor Curia who verbally acknowledged these results. Electronically Signed   By: JGarald BaldingM.D.   On: 10/13/2015 06:05   Ir Aorta/thoracic  10/13/2015  CLINICAL DATA:  39year old male with a history of chronic pulmonary infection with MAC, as well as prior resection for  Aspergilloma. He has developed hemoptysis, with significant bleeding on the prior night. Bronchoscopy could not be performed because of concerns for the patient's pulmonary status. Bilateral empiric embolization is  planned. EXAM: ULTRASOUND GUIDED ACCESS RIGHT COMMON FEMORAL ARTERY THORACIC AORTIC ANGIOGRAM SELECTIVE ANGIOGRAM OF MULTIPLE BILATERAL THORACIC SEGMENTAL VESSELS INCLUDING BILATERAL BRONCHIAL ARTERIES. 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 Date:  11/24/201611/24/2016 3:52 pm FLUOROSCOPY TIME:  22 minutes, 24 seconds MEDICATIONS AND MEDICAL HISTORY: 1.0 mg Versed, 50 mcg fentanyl ANESTHESIA/SEDATION: 73 minutes CONTRAST:  261m OMNIPAQUE IOHEXOL 300 MG/ML  SOLN COMPLICATIONS: None PROCEDURE: Informed consent was obtained from the patient following explanation of the procedure, risks, benefits and alternatives. Specific risks include bleeding, infection, arterial injury, need for further surgery or procedure, kidney injury, contrast reaction, non targeted embolization, neurologic injury/ deficit, cardiopulmonary collapse, death The patient understands, agrees and consents for the procedure. All questions were addressed. A time out was performed. Patient is position in the supine position on the fluoroscopy table. The right inguinal region was prepped and draped in the usual sterile fashion. Maximum sterile protection was used including gown and gloves mask. 1% lidocaine was used for local anesthesia. Ultrasound survey of the right inguinal region was performed with images stored and sent to PACs. A micropuncture needle was used access the right common femoral artery under ultrasound. With excellent arterial blood flow returned, and an .018 micro wire was passed through the needle, observed enter the abdominal aorta under fluoroscopy. The needle was removed, and a micropuncture sheath was placed over the wire. The inner dilator and wire were removed, and an 035 Bentson wire was advanced under fluoroscopy into the abdominal aorta. The sheath was removed and a standard 5 [place]  sheath was placed. The dilator was removed and the sheath was flushed. Bentson wire was advanced to the aortic arch, and a pigtail catheter was passed over the wire to the aortic arch. Thoracic aortic angiogram was performed. Pigtail catheter was exchanged for a Mickelson catheter which was used to select multiple segmental vessels of the thoracic arch. Angiogram was performed of each vessel selected. Once the right bronchial artery was engaged, angiogram was performed. Micro catheter system including a high-flow Renegade catheter was passed into the right bronchial artery, at a safe distance from the origin to avoid refluxing into the aorta. Empiric embolization was then performed with 500 - 700 micro meter embospheres. One vial was used. Micro catheter system was then removed, and a final angiogram was performed through the base catheter. Mickelson catheter was then used to select multiple segmental vessels searching for left bronchial artery. Exchange was made over the Bentson wire for a shunt catheter. The Chung catheter was successful with engaging the origin of left bronchial artery. Angiogram performed. Micro catheter system was then advanced into the left bronchial artery for empiric embolization. Five hundred - 700 micro meter embospheres were used. One vial used. Repeat angiogram was performed. Angiogram of the right common femoral artery was performed. Exoseal was deployed. Patient tolerated the procedure well and remained hemodynamically stable throughout. No complications were encountered and no significant blood loss encountered. FINDINGS: Thoracic aorta demonstrates no dissection flap or aneurysm. No significant atherosclerotic disease along the lumen of the aorta. Multiple segmental vessels identified including 2 right bronchial artery and single left bronchial artery from the aortic angiogram. Superior right bronchial artery angiogram: Tortuous vasculature without stenosis at the origin. Vessel is  of large caliber and supplies a cluster of abnormal vessels at the apex of the lung. Inflammatory tissue at the lung apex a demonstrates dense enhancement with no early draining vein identified. No evidence of arterial contribution towards the spinal canal. Status post microsphere embolization of the right superior bronchial artery there is no significant residual blood flow. Angiogram right supreme intercostal artery angiogram: No contribution to abnormal lung tissue. Angiogram right superior intercostal angiogram: No contribution to the lung apex. Left T9 segmental vessel angiogram: No contribution to the abnormal lung tissue. No reticular medullary vessel identified. Right inferior bronchial artery angiogram: No contribution to the abnormal vasculature at the right apex. Left T8 segmental vessel angiogram: No contribution to abnormal left-sided lung tissue. Left bronchial artery angiogram: Circuitous vasculature with significant contribution to abnormal lung tissue at the left apex. There is dense opacification of inflammatory tissue at the left apex. No extravasation is identified. No contribution of vasculature directed towards the spinal canal. Status post empiric embolization with 500-700 embospheres, no significant contribution of the left bronchial artery to the left apex. IMPRESSION: Status post thoracic aortic and segmental vessel angiogram with empiric embolization of left bronchial artery and superior right bronchial artery, each contributing significant blood flow to abnormal enhancing inflammatory tissue at the left and right apex, respectively. Both artery were embolized to stasis, with no significant opacification of abnormal tissue at the completion. Status post Exoseal deployment. Signed, Dulcy Fanny. Earleen Newport, DO [facility] Radiology Electronically Signed   By: Corrie Mckusick D.O.   On: 10/13/2015 19:21   Ir Angiogram Selective Each Additional  Vessel  10/13/2015  CLINICAL DATA:  39 year old male with a history of chronic pulmonary infection with MAC, as well as prior resection for Aspergilloma. He has developed hemoptysis, with significant bleeding on the prior night. Bronchoscopy could not be performed because of concerns for the patient's pulmonary status. Bilateral empiric embolization is planned. EXAM: ULTRASOUND GUIDED ACCESS RIGHT COMMON FEMORAL ARTERY THORACIC AORTIC ANGIOGRAM SELECTIVE ANGIOGRAM OF MULTIPLE BILATERAL THORACIC SEGMENTAL VESSELS INCLUDING BILATERAL BRONCHIAL ARTERIES. 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 Date:  11/24/201611/24/2016 3:52 pm FLUOROSCOPY TIME:  22 minutes, 24 seconds MEDICATIONS AND MEDICAL HISTORY: 1.0 mg Versed, 50 mcg fentanyl ANESTHESIA/SEDATION: 73 minutes CONTRAST:  212m OMNIPAQUE IOHEXOL 300 MG/ML  SOLN COMPLICATIONS: None PROCEDURE: Informed consent was obtained from the patient following explanation of the procedure, risks, benefits and alternatives. Specific risks include bleeding, infection, arterial injury, need for further surgery or procedure, kidney injury, contrast reaction, non targeted embolization, neurologic injury/ deficit, cardiopulmonary collapse, death The patient understands, agrees and consents for the procedure. All questions were addressed. A time out was performed. Patient is position in the supine position on the fluoroscopy table. The right inguinal region was prepped and draped in the usual sterile fashion. Maximum sterile protection was used including gown and gloves mask. 1% lidocaine was used for local anesthesia. Ultrasound survey of the right inguinal region was performed with images stored and sent to PACs. A micropuncture needle was used access the right common femoral artery under ultrasound. With excellent arterial blood flow returned, and an  .018 micro wire was passed through the needle, observed enter the abdominal aorta under fluoroscopy. The needle was removed, and a micropuncture sheath was placed over the wire. The inner dilator and wire were removed, and an 035 Bentson wire was advanced under fluoroscopy into the abdominal  aorta. The sheath was removed and a standard 5 [place] vascular sheath was placed. The dilator was removed and the sheath was flushed. Bentson wire was advanced to the aortic arch, and a pigtail catheter was passed over the wire to the aortic arch. Thoracic aortic angiogram was performed. Pigtail catheter was exchanged for a Mickelson catheter which was used to select multiple segmental vessels of the thoracic arch. Angiogram was performed of each vessel selected. Once the right bronchial artery was engaged, angiogram was performed. Micro catheter system including a high-flow Renegade catheter was passed into the right bronchial artery, at a safe distance from the origin to avoid refluxing into the aorta. Empiric embolization was then performed with 500 - 700 micro meter embospheres. One vial was used. Micro catheter system was then removed, and a final angiogram was performed through the base catheter. Mickelson catheter was then used to select multiple segmental vessels searching for left bronchial artery. Exchange was made over the Bentson wire for a shunt catheter. The Chung catheter was successful with engaging the origin of left bronchial artery. Angiogram performed. Micro catheter system was then advanced into the left bronchial artery for empiric embolization. Five hundred - 700 micro meter embospheres were used. One vial used. Repeat angiogram was performed. Angiogram of the right common femoral artery was performed. Exoseal was deployed. Patient tolerated the procedure well and remained hemodynamically stable throughout. No complications were encountered and no significant blood loss encountered. FINDINGS: Thoracic  aorta demonstrates no dissection flap or aneurysm. No significant atherosclerotic disease along the lumen of the aorta. Multiple segmental vessels identified including 2 right bronchial artery and single left bronchial artery from the aortic angiogram. Superior right bronchial artery angiogram: Tortuous vasculature without stenosis at the origin. Vessel is of large caliber and supplies a cluster of abnormal vessels at the apex of the lung. Inflammatory tissue at the lung apex a demonstrates dense enhancement with no early draining vein identified. No evidence of arterial contribution towards the spinal canal. Status post microsphere embolization of the right superior bronchial artery there is no significant residual blood flow. Angiogram right supreme intercostal artery angiogram: No contribution to abnormal lung tissue. Angiogram right superior intercostal angiogram: No contribution to the lung apex. Left T9 segmental vessel angiogram: No contribution to the abnormal lung tissue. No reticular medullary vessel identified. Right inferior bronchial artery angiogram: No contribution to the abnormal vasculature at the right apex. Left T8 segmental vessel angiogram: No contribution to abnormal left-sided lung tissue. Left bronchial artery angiogram: Circuitous vasculature with significant contribution to abnormal lung tissue at the left apex. There is dense opacification of inflammatory tissue at the left apex. No extravasation is identified. No contribution of vasculature directed towards the spinal canal. Status post empiric embolization with 500-700 embospheres, no significant contribution of the left bronchial artery to the left apex. IMPRESSION: Status post thoracic aortic and segmental vessel angiogram with empiric embolization of left bronchial artery and superior right bronchial artery, each contributing significant blood flow to abnormal enhancing inflammatory tissue at the left and right apex, respectively.  Both artery were embolized to stasis, with no significant opacification of abnormal tissue at the completion. Status post Exoseal deployment. Signed, Dulcy Fanny. Earleen Newport, DO [facility] Radiology Electronically Signed   By: Corrie Mckusick D.O.   On: 10/13/2015 19:21   Ir Angiogram Selective Each Additional Vessel  10/13/2015  CLINICAL DATA:  39 year old male with a history of chronic pulmonary infection with MAC, as well as prior  resection for Aspergilloma. He has developed hemoptysis, with significant bleeding on the prior night. Bronchoscopy could not be performed because of concerns for the patient's pulmonary status. Bilateral empiric embolization is planned. EXAM: ULTRASOUND GUIDED ACCESS RIGHT COMMON FEMORAL ARTERY THORACIC AORTIC ANGIOGRAM SELECTIVE ANGIOGRAM OF MULTIPLE BILATERAL THORACIC SEGMENTAL VESSELS INCLUDING BILATERAL BRONCHIAL ARTERIES. 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 Date:  11/24/201611/24/2016 3:52 pm FLUOROSCOPY TIME:  22 minutes, 24 seconds MEDICATIONS AND MEDICAL HISTORY: 1.0 mg Versed, 50 mcg fentanyl ANESTHESIA/SEDATION: 73 minutes CONTRAST:  212m OMNIPAQUE IOHEXOL 300 MG/ML  SOLN COMPLICATIONS: None PROCEDURE: Informed consent was obtained from the patient following explanation of the procedure, risks, benefits and alternatives. Specific risks include bleeding, infection, arterial injury, need for further surgery or procedure, kidney injury, contrast reaction, non targeted embolization, neurologic injury/ deficit, cardiopulmonary collapse, death The patient understands, agrees and consents for the procedure. All questions were addressed. A time out was performed. Patient is position in the supine position on the fluoroscopy table. The right inguinal region was prepped and draped in the usual sterile  fashion. Maximum sterile protection was used including gown and gloves mask. 1% lidocaine was used for local anesthesia. Ultrasound survey of the right inguinal region was performed with images stored and sent to PACs. A micropuncture needle was used access the right common femoral artery under ultrasound. With excellent arterial blood flow returned, and an .018 micro wire was passed through the needle, observed enter the abdominal aorta under fluoroscopy. The needle was removed, and a micropuncture sheath was placed over the wire. The inner dilator and wire were removed, and an 035 Bentson wire was advanced under fluoroscopy into the abdominal aorta. The sheath was removed and a standard 5 [place] sheath was placed. The dilator was removed and the sheath was flushed. Bentson wire was advanced to the aortic arch, and a pigtail catheter was passed over the wire to the aortic arch. Thoracic aortic angiogram was performed. Pigtail catheter was exchanged for a Mickelson catheter which was used to select multiple segmental vessels of the thoracic arch. Angiogram was performed of each vessel selected. Once the right bronchial artery was engaged, angiogram was performed. Micro catheter system including a high-flow Renegade catheter was passed into the right bronchial artery, at a safe distance from the origin to avoid refluxing into the aorta. Empiric embolization was then performed with 500 - 700 micro meter embospheres. One vial was used. Micro catheter system was then removed, and a final angiogram was performed through the base catheter. Mickelson catheter was then used to select multiple segmental vessels searching for left bronchial artery. Exchange was made over the Bentson wire for a shunt catheter. The Chung catheter was successful with engaging the origin of left bronchial artery. Angiogram performed. Micro catheter system was then advanced into the left bronchial artery for empiric embolization. Five  hundred - 700 micro meter embospheres were used. One vial used. Repeat angiogram was performed. Angiogram of the right common femoral artery was performed. Exoseal was deployed. Patient tolerated the procedure well and remained hemodynamically stable throughout. No complications were encountered and no significant blood loss encountered. FINDINGS: Thoracic aorta demonstrates no dissection flap or aneurysm. No significant atherosclerotic disease along the lumen of the aorta. Multiple segmental vessels identified including 2 right bronchial artery and single left bronchial artery from the aortic angiogram. Superior right bronchial artery angiogram: Tortuous vasculature without stenosis at the origin.  Vessel is of large caliber and supplies a cluster of abnormal vessels at the apex of the lung. Inflammatory tissue at the lung apex a demonstrates dense enhancement with no early draining vein identified. No evidence of arterial contribution towards the spinal canal. Status post microsphere embolization of the right superior bronchial artery there is no significant residual blood flow. Angiogram right supreme intercostal artery angiogram: No contribution to abnormal lung tissue. Angiogram right superior intercostal angiogram: No contribution to the lung apex. Left T9 segmental vessel angiogram: No contribution to the abnormal lung tissue. No reticular medullary vessel identified. Right inferior bronchial artery angiogram: No contribution to the abnormal vasculature at the right apex. Left T8 segmental vessel angiogram: No contribution to abnormal left-sided lung tissue. Left bronchial artery angiogram: Circuitous vasculature with significant contribution to abnormal lung tissue at the left apex. There is dense opacification of inflammatory tissue at the left apex. No extravasation is identified. No contribution of vasculature directed towards the spinal canal. Status post empiric embolization with 500-700 embospheres, no  significant contribution of the left bronchial artery to the left apex. IMPRESSION: Status post thoracic aortic and segmental vessel angiogram with empiric embolization of left bronchial artery and superior right bronchial artery, each contributing significant blood flow to abnormal enhancing inflammatory tissue at the left and right apex, respectively. Both artery were embolized to stasis, with no significant opacification of abnormal tissue at the completion. Status post Exoseal deployment. Signed, Dulcy Fanny. Earleen Newport, DO [facility] Radiology Electronically Signed   By: Corrie Mckusick D.O.   On: 10/13/2015 19:21   Ir Angiogram Selective Each Additional Vessel  10/13/2015  CLINICAL DATA:  39 year old male with a history of chronic pulmonary infection with MAC, as well as prior resection for Aspergilloma. He has developed hemoptysis, with significant bleeding on the prior night. Bronchoscopy could not be performed because of concerns for the patient's pulmonary status. Bilateral empiric embolization is planned. EXAM: ULTRASOUND GUIDED ACCESS RIGHT COMMON FEMORAL ARTERY THORACIC AORTIC ANGIOGRAM SELECTIVE ANGIOGRAM OF MULTIPLE BILATERAL THORACIC SEGMENTAL VESSELS INCLUDING BILATERAL BRONCHIAL ARTERIES. 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 Date:  11/24/201611/24/2016 3:52 pm FLUOROSCOPY TIME:  22 minutes, 24 seconds MEDICATIONS AND MEDICAL HISTORY: 1.0 mg Versed, 50 mcg fentanyl ANESTHESIA/SEDATION: 73 minutes CONTRAST:  248m OMNIPAQUE IOHEXOL 300 MG/ML  SOLN COMPLICATIONS: None PROCEDURE: Informed consent was obtained from the patient following explanation of the procedure, risks, benefits and alternatives. Specific risks include bleeding, infection, arterial injury, need for further surgery or procedure, kidney injury, contrast  reaction, non targeted embolization, neurologic injury/ deficit, cardiopulmonary collapse, death The patient understands, agrees and consents for the procedure. All questions were addressed. A time out was performed. Patient is position in the supine position on the fluoroscopy table. The right inguinal region was prepped and draped in the usual sterile fashion. Maximum sterile protection was used including gown and gloves mask. 1% lidocaine was used for local anesthesia. Ultrasound survey of the right inguinal region was performed with images stored and sent to PACs. A micropuncture needle was used access the right common femoral artery under ultrasound. With excellent arterial blood flow returned, and an .018 micro wire was passed through the needle, observed enter the abdominal aorta under fluoroscopy. The needle was removed, and a micropuncture sheath was placed over the wire. The inner dilator and wire were removed, and an 035 Bentson wire was advanced under fluoroscopy into  the abdominal aorta. The sheath was removed and a standard 5 [place] vascular sheath was placed. The dilator was removed and the sheath was flushed. Bentson wire was advanced to the aortic arch, and a pigtail catheter was passed over the wire to the aortic arch. Thoracic aortic angiogram was performed. Pigtail catheter was exchanged for a Mickelson catheter which was used to select multiple segmental vessels of the thoracic arch. Angiogram was performed of each vessel selected. Once the right bronchial artery was engaged, angiogram was performed. Micro catheter system including a high-flow Renegade catheter was passed into the right bronchial artery, at a safe distance from the origin to avoid refluxing into the aorta. Empiric embolization was then performed with 500 - 700 micro meter embospheres. One vial was used. Micro catheter system was then removed, and a final angiogram was performed through the base catheter. Mickelson catheter was  then used to select multiple segmental vessels searching for left bronchial artery. Exchange was made over the Bentson wire for a shunt catheter. The Chung catheter was successful with engaging the origin of left bronchial artery. Angiogram performed. Micro catheter system was then advanced into the left bronchial artery for empiric embolization. Five hundred - 700 micro meter embospheres were used. One vial used. Repeat angiogram was performed. Angiogram of the right common femoral artery was performed. Exoseal was deployed. Patient tolerated the procedure well and remained hemodynamically stable throughout. No complications were encountered and no significant blood loss encountered. FINDINGS: Thoracic aorta demonstrates no dissection flap or aneurysm. No significant atherosclerotic disease along the lumen of the aorta. Multiple segmental vessels identified including 2 right bronchial artery and single left bronchial artery from the aortic angiogram. Superior right bronchial artery angiogram: Tortuous vasculature without stenosis at the origin. Vessel is of large caliber and supplies a cluster of abnormal vessels at the apex of the lung. Inflammatory tissue at the lung apex a demonstrates dense enhancement with no early draining vein identified. No evidence of arterial contribution towards the spinal canal. Status post microsphere embolization of the right superior bronchial artery there is no significant residual blood flow. Angiogram right supreme intercostal artery angiogram: No contribution to abnormal lung tissue. Angiogram right superior intercostal angiogram: No contribution to the lung apex. Left T9 segmental vessel angiogram: No contribution to the abnormal lung tissue. No reticular medullary vessel identified. Right inferior bronchial artery angiogram: No contribution to the abnormal vasculature at the right apex. Left T8 segmental vessel angiogram: No contribution to abnormal left-sided lung tissue. Left  bronchial artery angiogram: Circuitous vasculature with significant contribution to abnormal lung tissue at the left apex. There is dense opacification of inflammatory tissue at the left apex. No extravasation is identified. No contribution of vasculature directed towards the spinal canal. Status post empiric embolization with 500-700 embospheres, no significant contribution of the left bronchial artery to the left apex. IMPRESSION: Status post thoracic aortic and segmental vessel angiogram with empiric embolization of left bronchial artery and superior right bronchial artery, each contributing significant blood flow to abnormal enhancing inflammatory tissue at the left and right apex, respectively. Both artery were embolized to stasis, with no significant opacification of abnormal tissue at the completion. Status post Exoseal deployment. Signed, Dulcy Fanny. Earleen Newport, DO [facility] Radiology Electronically Signed   By: Corrie Mckusick D.O.   On: 10/13/2015 19:21   Ir Angiogram Selective Each Additional Vessel  10/13/2015  CLINICAL DATA:  39 year old male with a history of chronic pulmonary infection with MAC, as well  as prior resection for Aspergilloma. He has developed hemoptysis, with significant bleeding on the prior night. Bronchoscopy could not be performed because of concerns for the patient's pulmonary status. Bilateral empiric embolization is planned. EXAM: ULTRASOUND GUIDED ACCESS RIGHT COMMON FEMORAL ARTERY THORACIC AORTIC ANGIOGRAM SELECTIVE ANGIOGRAM OF MULTIPLE BILATERAL THORACIC SEGMENTAL VESSELS INCLUDING BILATERAL BRONCHIAL ARTERIES. 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 Date:  11/24/201611/24/2016 3:52 pm FLUOROSCOPY TIME:  22 minutes, 24 seconds MEDICATIONS AND MEDICAL HISTORY: 1.0 mg Versed, 50 mcg fentanyl  ANESTHESIA/SEDATION: 73 minutes CONTRAST:  216m OMNIPAQUE IOHEXOL 300 MG/ML  SOLN COMPLICATIONS: None PROCEDURE: Informed consent was obtained from the patient following explanation of the procedure, risks, benefits and alternatives. Specific risks include bleeding, infection, arterial injury, need for further surgery or procedure, kidney injury, contrast reaction, non targeted embolization, neurologic injury/ deficit, cardiopulmonary collapse, death The patient understands, agrees and consents for the procedure. All questions were addressed. A time out was performed. Patient is position in the supine position on the fluoroscopy table. The right inguinal region was prepped and draped in the usual sterile fashion. Maximum sterile protection was used including gown and gloves mask. 1% lidocaine was used for local anesthesia. Ultrasound survey of the right inguinal region was performed with images stored and sent to PACs. A micropuncture needle was used access the right common femoral artery under ultrasound. With excellent arterial blood flow returned, and an .018 micro wire was passed through the needle, observed enter the abdominal aorta under fluoroscopy. The needle was removed, and a micropuncture sheath was placed over the wire. The inner dilator and wire were removed, and an 035 Bentson wire was advanced under fluoroscopy into the abdominal aorta. The sheath was removed and a standard 5 [place] sheath was placed. The dilator was removed and the sheath was flushed. Bentson wire was advanced to the aortic arch, and a pigtail catheter was passed over the wire to the aortic arch. Thoracic aortic angiogram was performed. Pigtail catheter was exchanged for a Mickelson catheter which was used to select multiple segmental vessels of the thoracic arch. Angiogram was performed of each vessel selected. Once the right bronchial artery was engaged, angiogram was performed. Micro catheter system including a high-flow  Renegade catheter was passed into the right bronchial artery, at a safe distance from the origin to avoid refluxing into the aorta. Empiric embolization was then performed with 500 - 700 micro meter embospheres. One vial was used. Micro catheter system was then removed, and a final angiogram was performed through the base catheter. Mickelson catheter was then used to select multiple segmental vessels searching for left bronchial artery. Exchange was made over the Bentson wire for a shunt catheter. The Chung catheter was successful with engaging the origin of left bronchial artery. Angiogram performed. Micro catheter system was then advanced into the left bronchial artery for empiric embolization. Five hundred - 700 micro meter embospheres were used. One vial used. Repeat angiogram was performed. Angiogram of the right common femoral artery was performed. Exoseal was deployed. Patient tolerated the procedure well and remained hemodynamically stable throughout. No complications were encountered and no significant blood loss encountered. FINDINGS: Thoracic aorta demonstrates no dissection flap or aneurysm. No significant atherosclerotic disease along the lumen of the aorta. Multiple segmental vessels identified including 2 right bronchial artery and single left bronchial artery from the aortic angiogram. Superior right bronchial artery angiogram: Tortuous vasculature without stenosis at  the origin. Vessel is of large caliber and supplies a cluster of abnormal vessels at the apex of the lung. Inflammatory tissue at the lung apex a demonstrates dense enhancement with no early draining vein identified. No evidence of arterial contribution towards the spinal canal. Status post microsphere embolization of the right superior bronchial artery there is no significant residual blood flow. Angiogram right supreme intercostal artery angiogram: No contribution to abnormal lung tissue. Angiogram right superior intercostal  angiogram: No contribution to the lung apex. Left T9 segmental vessel angiogram: No contribution to the abnormal lung tissue. No reticular medullary vessel identified. Right inferior bronchial artery angiogram: No contribution to the abnormal vasculature at the right apex. Left T8 segmental vessel angiogram: No contribution to abnormal left-sided lung tissue. Left bronchial artery angiogram: Circuitous vasculature with significant contribution to abnormal lung tissue at the left apex. There is dense opacification of inflammatory tissue at the left apex. No extravasation is identified. No contribution of vasculature directed towards the spinal canal. Status post empiric embolization with 500-700 embospheres, no significant contribution of the left bronchial artery to the left apex. IMPRESSION: Status post thoracic aortic and segmental vessel angiogram with empiric embolization of left bronchial artery and superior right bronchial artery, each contributing significant blood flow to abnormal enhancing inflammatory tissue at the left and right apex, respectively. Both artery were embolized to stasis, with no significant opacification of abnormal tissue at the completion. Status post Exoseal deployment. Signed, Dulcy Fanny. Earleen Newport, DO [facility] Radiology Electronically Signed   By: Corrie Mckusick D.O.   On: 10/13/2015 19:21   Ir Angiogram Selective Each Additional Vessel  10/13/2015  CLINICAL DATA:  39 year old male with a history of chronic pulmonary infection with MAC, as well as prior resection for Aspergilloma. He has developed hemoptysis, with significant bleeding on the prior night. Bronchoscopy could not be performed because of concerns for the patient's pulmonary status. Bilateral empiric embolization is planned. EXAM: ULTRASOUND GUIDED ACCESS RIGHT COMMON FEMORAL ARTERY THORACIC AORTIC ANGIOGRAM SELECTIVE ANGIOGRAM OF MULTIPLE BILATERAL THORACIC SEGMENTAL VESSELS  INCLUDING BILATERAL BRONCHIAL ARTERIES. 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 Date:  11/24/201611/24/2016 3:52 pm FLUOROSCOPY TIME:  22 minutes, 24 seconds MEDICATIONS AND MEDICAL HISTORY: 1.0 mg Versed, 50 mcg fentanyl ANESTHESIA/SEDATION: 73 minutes CONTRAST:  230m OMNIPAQUE IOHEXOL 300 MG/ML  SOLN COMPLICATIONS: None PROCEDURE: Informed consent was obtained from the patient following explanation of the procedure, risks, benefits and alternatives. Specific risks include bleeding, infection, arterial injury, need for further surgery or procedure, kidney injury, contrast reaction, non targeted embolization, neurologic injury/ deficit, cardiopulmonary collapse, death The patient understands, agrees and consents for the procedure. All questions were addressed. A time out was performed. Patient is position in the supine position on the fluoroscopy table. The right inguinal region was prepped and draped in the usual sterile fashion. Maximum sterile protection was used including gown and gloves mask. 1% lidocaine was used for local anesthesia. Ultrasound survey of the right inguinal region was performed with images stored and sent to PACs. A micropuncture needle was used access the right common femoral artery under ultrasound. With excellent arterial blood flow returned, and an .018 micro wire was passed through the needle, observed enter the abdominal aorta under fluoroscopy. The needle was removed, and a micropuncture sheath was placed over the wire. The inner dilator and wire were removed, and an 035 Bentson wire was advanced under  fluoroscopy into the abdominal aorta. The sheath was removed and a standard 5 [place] vascular sheath was placed. The dilator was removed and the sheath was flushed. Bentson wire was advanced to the aortic arch, and a pigtail catheter was passed over the wire  to the aortic arch. Thoracic aortic angiogram was performed. Pigtail catheter was exchanged for a Mickelson catheter which was used to select multiple segmental vessels of the thoracic arch. Angiogram was performed of each vessel selected. Once the right bronchial artery was engaged, angiogram was performed. Micro catheter system including a high-flow Renegade catheter was passed into the right bronchial artery, at a safe distance from the origin to avoid refluxing into the aorta. Empiric embolization was then performed with 500 - 700 micro meter embospheres. One vial was used. Micro catheter system was then removed, and a final angiogram was performed through the base catheter. Mickelson catheter was then used to select multiple segmental vessels searching for left bronchial artery. Exchange was made over the Bentson wire for a shunt catheter. The Chung catheter was successful with engaging the origin of left bronchial artery. Angiogram performed. Micro catheter system was then advanced into the left bronchial artery for empiric embolization. Five hundred - 700 micro meter embospheres were used. One vial used. Repeat angiogram was performed. Angiogram of the right common femoral artery was performed. Exoseal was deployed. Patient tolerated the procedure well and remained hemodynamically stable throughout. No complications were encountered and no significant blood loss encountered. FINDINGS: Thoracic aorta demonstrates no dissection flap or aneurysm. No significant atherosclerotic disease along the lumen of the aorta. Multiple segmental vessels identified including 2 right bronchial artery and single left bronchial artery from the aortic angiogram. Superior right bronchial artery angiogram: Tortuous vasculature without stenosis at the origin. Vessel is of large caliber and supplies a cluster of abnormal vessels at the apex of the lung. Inflammatory tissue at the lung apex a demonstrates dense enhancement with no  early draining vein identified. No evidence of arterial contribution towards the spinal canal. Status post microsphere embolization of the right superior bronchial artery there is no significant residual blood flow. Angiogram right supreme intercostal artery angiogram: No contribution to abnormal lung tissue. Angiogram right superior intercostal angiogram: No contribution to the lung apex. Left T9 segmental vessel angiogram: No contribution to the abnormal lung tissue. No reticular medullary vessel identified. Right inferior bronchial artery angiogram: No contribution to the abnormal vasculature at the right apex. Left T8 segmental vessel angiogram: No contribution to abnormal left-sided lung tissue. Left bronchial artery angiogram: Circuitous vasculature with significant contribution to abnormal lung tissue at the left apex. There is dense opacification of inflammatory tissue at the left apex. No extravasation is identified. No contribution of vasculature directed towards the spinal canal. Status post empiric embolization with 500-700 embospheres, no significant contribution of the left bronchial artery to the left apex. IMPRESSION: Status post thoracic aortic and segmental vessel angiogram with empiric embolization of left bronchial artery and superior right bronchial artery, each contributing significant blood flow to abnormal enhancing inflammatory tissue at the left and right apex, respectively. Both artery were embolized to stasis, with no significant opacification of abnormal tissue at the completion. Status post Exoseal deployment. Signed, Dulcy Fanny. Earleen Newport, DO [facility] Radiology Electronically Signed   By: Corrie Mckusick D.O.   On: 10/13/2015 19:21   Ir Angiogram Selective Each Additional Vessel  10/13/2015  CLINICAL DATA:  39 year old male with a history of chronic pulmonary infection with MAC,  as well as prior resection for Aspergilloma. He has  developed hemoptysis, with significant bleeding on the prior night. Bronchoscopy could not be performed because of concerns for the patient's pulmonary status. Bilateral empiric embolization is planned. EXAM: ULTRASOUND GUIDED ACCESS RIGHT COMMON FEMORAL ARTERY THORACIC AORTIC ANGIOGRAM SELECTIVE ANGIOGRAM OF MULTIPLE BILATERAL THORACIC SEGMENTAL VESSELS INCLUDING BILATERAL BRONCHIAL ARTERIES. 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 Date:  11/24/201611/24/2016 3:52 pm FLUOROSCOPY TIME:  22 minutes, 24 seconds MEDICATIONS AND MEDICAL HISTORY: 1.0 mg Versed, 50 mcg fentanyl ANESTHESIA/SEDATION: 73 minutes CONTRAST:  249m OMNIPAQUE IOHEXOL 300 MG/ML  SOLN COMPLICATIONS: None PROCEDURE: Informed consent was obtained from the patient following explanation of the procedure, risks, benefits and alternatives. Specific risks include bleeding, infection, arterial injury, need for further surgery or procedure, kidney injury, contrast reaction, non targeted embolization, neurologic injury/ deficit, cardiopulmonary collapse, death The patient understands, agrees and consents for the procedure. All questions were addressed. A time out was performed. Patient is position in the supine position on the fluoroscopy table. The right inguinal region was prepped and draped in the usual sterile fashion. Maximum sterile protection was used including gown and gloves mask. 1% lidocaine was used for local anesthesia. Ultrasound survey of the right inguinal region was performed with images stored and sent to PACs. A micropuncture needle was used access the right common femoral artery under ultrasound. With excellent arterial blood flow returned, and an .018 micro wire was passed through the needle, observed enter the abdominal aorta under fluoroscopy. The needle was removed, and a micropuncture sheath was placed  over the wire. The inner dilator and wire were removed, and an 035 Bentson wire was advanced under fluoroscopy into the abdominal aorta. The sheath was removed and a standard 5 [place] sheath was placed. The dilator was removed and the sheath was flushed. Bentson wire was advanced to the aortic arch, and a pigtail catheter was passed over the wire to the aortic arch. Thoracic aortic angiogram was performed. Pigtail catheter was exchanged for a Mickelson catheter which was used to select multiple segmental vessels of the thoracic arch. Angiogram was performed of each vessel selected. Once the right bronchial artery was engaged, angiogram was performed. Micro catheter system including a high-flow Renegade catheter was passed into the right bronchial artery, at a safe distance from the origin to avoid refluxing into the aorta. Empiric embolization was then performed with 500 - 700 micro meter embospheres. One vial was used. Micro catheter system was then removed, and a final angiogram was performed through the base catheter. Mickelson catheter was then used to select multiple segmental vessels searching for left bronchial artery. Exchange was made over the Bentson wire for a shunt catheter. The Chung catheter was successful with engaging the origin of left bronchial artery. Angiogram performed. Micro catheter system was then advanced into the left bronchial artery for empiric embolization. Five hundred - 700 micro meter embospheres were used. One vial used. Repeat angiogram was performed. Angiogram of the right common femoral artery was performed. Exoseal was deployed. Patient tolerated the procedure well and remained hemodynamically stable throughout. No complications were encountered and no significant blood loss encountered. FINDINGS: Thoracic aorta demonstrates no dissection flap or aneurysm. No significant atherosclerotic disease along the lumen of the aorta. Multiple segmental vessels identified including  2 right bronchial artery and single left bronchial artery from the aortic angiogram. Superior right bronchial artery angiogram: Tortuous vasculature  without stenosis at the origin. Vessel is of large caliber and supplies a cluster of abnormal vessels at the apex of the lung. Inflammatory tissue at the lung apex a demonstrates dense enhancement with no early draining vein identified. No evidence of arterial contribution towards the spinal canal. Status post microsphere embolization of the right superior bronchial artery there is no significant residual blood flow. Angiogram right supreme intercostal artery angiogram: No contribution to abnormal lung tissue. Angiogram right superior intercostal angiogram: No contribution to the lung apex. Left T9 segmental vessel angiogram: No contribution to the abnormal lung tissue. No reticular medullary vessel identified. Right inferior bronchial artery angiogram: No contribution to the abnormal vasculature at the right apex. Left T8 segmental vessel angiogram: No contribution to abnormal left-sided lung tissue. Left bronchial artery angiogram: Circuitous vasculature with significant contribution to abnormal lung tissue at the left apex. There is dense opacification of inflammatory tissue at the left apex. No extravasation is identified. No contribution of vasculature directed towards the spinal canal. Status post empiric embolization with 500-700 embospheres, no significant contribution of the left bronchial artery to the left apex. IMPRESSION: Status post thoracic aortic and segmental vessel angiogram with empiric embolization of left bronchial artery and superior right bronchial artery, each contributing significant blood flow to abnormal enhancing inflammatory tissue at the left and right apex, respectively. Both artery were embolized to stasis, with no significant opacification of abnormal tissue at the completion. Status post Exoseal deployment. Signed, Dulcy Fanny. Earleen Newport, DO  [facility] Radiology Electronically Signed   By: Corrie Mckusick D.O.   On: 10/13/2015 19:21   Ir Angiogram Selective Each Additional Vessel  10/13/2015  CLINICAL DATA:  39 year old male with a history of chronic pulmonary infection with MAC, as well as prior resection for Aspergilloma. He has developed hemoptysis, with significant bleeding on the prior night. Bronchoscopy could not be performed because of concerns for the patient's pulmonary status. Bilateral empiric embolization is planned. EXAM: ULTRASOUND GUIDED ACCESS RIGHT COMMON FEMORAL ARTERY THORACIC AORTIC ANGIOGRAM SELECTIVE ANGIOGRAM OF MULTIPLE BILATERAL THORACIC SEGMENTAL VESSELS INCLUDING BILATERAL BRONCHIAL ARTERIES. 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 Date:  11/24/201611/24/2016 3:52 pm FLUOROSCOPY TIME:  22 minutes, 24 seconds MEDICATIONS AND MEDICAL HISTORY: 1.0 mg Versed, 50 mcg fentanyl ANESTHESIA/SEDATION: 73 minutes CONTRAST:  271m OMNIPAQUE IOHEXOL 300 MG/ML  SOLN COMPLICATIONS: None PROCEDURE: Informed consent was obtained from the patient following explanation of the procedure, risks, benefits and alternatives. Specific risks include bleeding, infection, arterial injury, need for further surgery or procedure, kidney injury, contrast reaction, non targeted embolization, neurologic injury/ deficit, cardiopulmonary collapse, death The patient understands, agrees and consents for the procedure. All questions were addressed. A time out was performed. Patient is position in the supine position on the fluoroscopy table. The right inguinal region was prepped and draped in the usual sterile fashion. Maximum sterile protection was used including gown and gloves mask. 1% lidocaine was used for local anesthesia. Ultrasound survey of the right inguinal region was  performed with images stored and sent to PACs. A micropuncture needle was used access the right common femoral artery under ultrasound. With excellent arterial blood flow returned, and an .018 micro wire was passed through the needle, observed enter the abdominal aorta under fluoroscopy. The needle was removed, and a micropuncture sheath was placed over the wire. The inner dilator and wire were removed, and an 035 Bentson wire  was advanced under fluoroscopy into the abdominal aorta. The sheath was removed and a standard 5 [place] vascular sheath was placed. The dilator was removed and the sheath was flushed. Bentson wire was advanced to the aortic arch, and a pigtail catheter was passed over the wire to the aortic arch. Thoracic aortic angiogram was performed. Pigtail catheter was exchanged for a Mickelson catheter which was used to select multiple segmental vessels of the thoracic arch. Angiogram was performed of each vessel selected. Once the right bronchial artery was engaged, angiogram was performed. Micro catheter system including a high-flow Renegade catheter was passed into the right bronchial artery, at a safe distance from the origin to avoid refluxing into the aorta. Empiric embolization was then performed with 500 - 700 micro meter embospheres. One vial was used. Micro catheter system was then removed, and a final angiogram was performed through the base catheter. Mickelson catheter was then used to select multiple segmental vessels searching for left bronchial artery. Exchange was made over the Bentson wire for a shunt catheter. The Chung catheter was successful with engaging the origin of left bronchial artery. Angiogram performed. Micro catheter system was then advanced into the left bronchial artery for empiric embolization. Five hundred - 700 micro meter embospheres were used. One vial used. Repeat angiogram was performed. Angiogram of the right common femoral artery was performed. Exoseal was  deployed. Patient tolerated the procedure well and remained hemodynamically stable throughout. No complications were encountered and no significant blood loss encountered. FINDINGS: Thoracic aorta demonstrates no dissection flap or aneurysm. No significant atherosclerotic disease along the lumen of the aorta. Multiple segmental vessels identified including 2 right bronchial artery and single left bronchial artery from the aortic angiogram. Superior right bronchial artery angiogram: Tortuous vasculature without stenosis at the origin. Vessel is of large caliber and supplies a cluster of abnormal vessels at the apex of the lung. Inflammatory tissue at the lung apex a demonstrates dense enhancement with no early draining vein identified. No evidence of arterial contribution towards the spinal canal. Status post microsphere embolization of the right superior bronchial artery there is no significant residual blood flow. Angiogram right supreme intercostal artery angiogram: No contribution to abnormal lung tissue. Angiogram right superior intercostal angiogram: No contribution to the lung apex. Left T9 segmental vessel angiogram: No contribution to the abnormal lung tissue. No reticular medullary vessel identified. Right inferior bronchial artery angiogram: No contribution to the abnormal vasculature at the right apex. Left T8 segmental vessel angiogram: No contribution to abnormal left-sided lung tissue. Left bronchial artery angiogram: Circuitous vasculature with significant contribution to abnormal lung tissue at the left apex. There is dense opacification of inflammatory tissue at the left apex. No extravasation is identified. No contribution of vasculature directed towards the spinal canal. Status post empiric embolization with 500-700 embospheres, no significant contribution of the left bronchial artery to the left apex. IMPRESSION: Status post thoracic aortic and segmental vessel angiogram with empiric embolization  of left bronchial artery and superior right bronchial artery, each contributing significant blood flow to abnormal enhancing inflammatory tissue at the left and right apex, respectively. Both artery were embolized to stasis, with no significant opacification of abnormal tissue at the completion. Status post Exoseal deployment. Signed, Dulcy Fanny. Earleen Newport, DO [facility] Radiology Electronically Signed   By: Corrie Mckusick D.O.   On: 10/13/2015 19:21   Ir Angiogram Follow Up Study  10/13/2015  CLINICAL DATA:  39 year old male with a history of chronic pulmonary infection  with MAC, as well as prior resection for Aspergilloma. He has developed hemoptysis, with significant bleeding on the prior night. Bronchoscopy could not be performed because of concerns for the patient's pulmonary status. Bilateral empiric embolization is planned. EXAM: ULTRASOUND GUIDED ACCESS RIGHT COMMON FEMORAL ARTERY THORACIC AORTIC ANGIOGRAM SELECTIVE ANGIOGRAM OF MULTIPLE BILATERAL THORACIC SEGMENTAL VESSELS INCLUDING BILATERAL BRONCHIAL ARTERIES. 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 Date:  11/24/201611/24/2016 3:52 pm FLUOROSCOPY TIME:  22 minutes, 24 seconds MEDICATIONS AND MEDICAL HISTORY: 1.0 mg Versed, 50 mcg fentanyl ANESTHESIA/SEDATION: 73 minutes CONTRAST:  242m OMNIPAQUE IOHEXOL 300 MG/ML  SOLN COMPLICATIONS: None PROCEDURE: Informed consent was obtained from the patient following explanation of the procedure, risks, benefits and alternatives. Specific risks include bleeding, infection, arterial injury, need for further surgery or procedure, kidney injury, contrast reaction, non targeted embolization, neurologic injury/ deficit, cardiopulmonary collapse, death The patient understands, agrees and consents for the procedure. All questions were addressed. A  time out was performed. Patient is position in the supine position on the fluoroscopy table. The right inguinal region was prepped and draped in the usual sterile fashion. Maximum sterile protection was used including gown and gloves mask. 1% lidocaine was used for local anesthesia. Ultrasound survey of the right inguinal region was performed with images stored and sent to PACs. A micropuncture needle was used access the right common femoral artery under ultrasound. With excellent arterial blood flow returned, and an .018 micro wire was passed through the needle, observed enter the abdominal aorta under fluoroscopy. The needle was removed, and a micropuncture sheath was placed over the wire. The inner dilator and wire were removed, and an 035 Bentson wire was advanced under fluoroscopy into the abdominal aorta. The sheath was removed and a standard 5 [place] sheath was placed. The dilator was removed and the sheath was flushed. Bentson wire was advanced to the aortic arch, and a pigtail catheter was passed over the wire to the aortic arch. Thoracic aortic angiogram was performed. Pigtail catheter was exchanged for a Mickelson catheter which was used to select multiple segmental vessels of the thoracic arch. Angiogram was performed of each vessel selected. Once the right bronchial artery was engaged, angiogram was performed. Micro catheter system including a high-flow Renegade catheter was passed into the right bronchial artery, at a safe distance from the origin to avoid refluxing into the aorta. Empiric embolization was then performed with 500 - 700 micro meter embospheres. One vial was used. Micro catheter system was then removed, and a final angiogram was performed through the base catheter. Mickelson catheter was then used to select multiple segmental vessels searching for left bronchial artery. Exchange was made over the Bentson wire for a shunt catheter. The Chung catheter was successful with engaging  the origin of left bronchial artery. Angiogram performed. Micro catheter system was then advanced into the left bronchial artery for empiric embolization. Five hundred - 700 micro meter embospheres were used. One vial used. Repeat angiogram was performed. Angiogram of the right common femoral artery was performed. Exoseal was deployed. Patient tolerated the procedure well and remained hemodynamically stable throughout. No complications were encountered and no significant blood loss encountered. FINDINGS: Thoracic aorta demonstrates no dissection flap or aneurysm. No significant atherosclerotic disease along the lumen of the aorta. Multiple segmental vessels identified including 2 right bronchial artery and single left bronchial artery from the aortic angiogram. Superior right bronchial artery angiogram: Tortuous  vasculature without stenosis at the origin. Vessel is of large caliber and supplies a cluster of abnormal vessels at the apex of the lung. Inflammatory tissue at the lung apex a demonstrates dense enhancement with no early draining vein identified. No evidence of arterial contribution towards the spinal canal. Status post microsphere embolization of the right superior bronchial artery there is no significant residual blood flow. Angiogram right supreme intercostal artery angiogram: No contribution to abnormal lung tissue. Angiogram right superior intercostal angiogram: No contribution to the lung apex. Left T9 segmental vessel angiogram: No contribution to the abnormal lung tissue. No reticular medullary vessel identified. Right inferior bronchial artery angiogram: No contribution to the abnormal vasculature at the right apex. Left T8 segmental vessel angiogram: No contribution to abnormal left-sided lung tissue. Left bronchial artery angiogram: Circuitous vasculature with significant contribution to abnormal lung tissue at the left apex. There is dense opacification of inflammatory tissue at the left apex.  No extravasation is identified. No contribution of vasculature directed towards the spinal canal. Status post empiric embolization with 500-700 embospheres, no significant contribution of the left bronchial artery to the left apex. IMPRESSION: Status post thoracic aortic and segmental vessel angiogram with empiric embolization of left bronchial artery and superior right bronchial artery, each contributing significant blood flow to abnormal enhancing inflammatory tissue at the left and right apex, respectively. Both artery were embolized to stasis, with no significant opacification of abnormal tissue at the completion. Status post Exoseal deployment. Signed, Dulcy Fanny. Earleen Newport, DO [facility] Radiology Electronically Signed   By: Corrie Mckusick D.O.   On: 10/13/2015 19:21   Ir Angiogram Follow Up Study  10/13/2015  CLINICAL DATA:  39 year old male with a history of chronic pulmonary infection with MAC, as well as prior resection for Aspergilloma. He has developed hemoptysis, with significant bleeding on the prior night. Bronchoscopy could not be performed because of concerns for the patient's pulmonary status. Bilateral empiric embolization is planned. EXAM: ULTRASOUND GUIDED ACCESS RIGHT COMMON FEMORAL ARTERY THORACIC AORTIC ANGIOGRAM SELECTIVE ANGIOGRAM OF MULTIPLE BILATERAL THORACIC SEGMENTAL VESSELS INCLUDING BILATERAL BRONCHIAL ARTERIES. 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 Date:  11/24/201611/24/2016 3:52 pm FLUOROSCOPY TIME:  22 minutes, 24 seconds MEDICATIONS AND MEDICAL HISTORY: 1.0 mg Versed, 50 mcg fentanyl ANESTHESIA/SEDATION: 73 minutes CONTRAST:  276m OMNIPAQUE IOHEXOL 300 MG/ML  SOLN COMPLICATIONS: None PROCEDURE: Informed consent was obtained from the patient following explanation of the procedure, risks, benefits and  alternatives. Specific risks include bleeding, infection, arterial injury, need for further surgery or procedure, kidney injury, contrast reaction, non targeted embolization, neurologic injury/ deficit, cardiopulmonary collapse, death The patient understands, agrees and consents for the procedure. All questions were addressed. A time out was performed. Patient is position in the supine position on the fluoroscopy table. The right inguinal region was prepped and draped in the usual sterile fashion. Maximum sterile protection was used including gown and gloves mask. 1% lidocaine was used for local anesthesia. Ultrasound survey of the right inguinal region was performed with images stored and sent to PACs. A micropuncture needle was used access the right common femoral artery under ultrasound. With excellent arterial blood flow returned, and an .018 micro wire was passed through the needle, observed enter the abdominal aorta under fluoroscopy. The needle was removed, and a micropuncture sheath was placed over the wire. The inner dilator and wire were removed, and an 035 Bentson wire  was advanced under fluoroscopy into the abdominal aorta. The sheath was removed and a standard 5 [place] vascular sheath was placed. The dilator was removed and the sheath was flushed. Bentson wire was advanced to the aortic arch, and a pigtail catheter was passed over the wire to the aortic arch. Thoracic aortic angiogram was performed. Pigtail catheter was exchanged for a Mickelson catheter which was used to select multiple segmental vessels of the thoracic arch. Angiogram was performed of each vessel selected. Once the right bronchial artery was engaged, angiogram was performed. Micro catheter system including a high-flow Renegade catheter was passed into the right bronchial artery, at a safe distance from the origin to avoid refluxing into the aorta. Empiric embolization was then performed with 500 - 700 micro meter embospheres. One vial  was used. Micro catheter system was then removed, and a final angiogram was performed through the base catheter. Mickelson catheter was then used to select multiple segmental vessels searching for left bronchial artery. Exchange was made over the Bentson wire for a shunt catheter. The Chung catheter was successful with engaging the origin of left bronchial artery. Angiogram performed. Micro catheter system was then advanced into the left bronchial artery for empiric embolization. Five hundred - 700 micro meter embospheres were used. One vial used. Repeat angiogram was performed. Angiogram of the right common femoral artery was performed. Exoseal was deployed. Patient tolerated the procedure well and remained hemodynamically stable throughout. No complications were encountered and no significant blood loss encountered. FINDINGS: Thoracic aorta demonstrates no dissection flap or aneurysm. No significant atherosclerotic disease along the lumen of the aorta. Multiple segmental vessels identified including 2 right bronchial artery and single left bronchial artery from the aortic angiogram. Superior right bronchial artery angiogram: Tortuous vasculature without stenosis at the origin. Vessel is of large caliber and supplies a cluster of abnormal vessels at the apex of the lung. Inflammatory tissue at the lung apex a demonstrates dense enhancement with no early draining vein identified. No evidence of arterial contribution towards the spinal canal. Status post microsphere embolization of the right superior bronchial artery there is no significant residual blood flow. Angiogram right supreme intercostal artery angiogram: No contribution to abnormal lung tissue. Angiogram right superior intercostal angiogram: No contribution to the lung apex. Left T9 segmental vessel angiogram: No contribution to the abnormal lung tissue. No reticular medullary vessel identified. Right inferior bronchial artery angiogram: No contribution to  the abnormal vasculature at the right apex. Left T8 segmental vessel angiogram: No contribution to abnormal left-sided lung tissue. Left bronchial artery angiogram: Circuitous vasculature with significant contribution to abnormal lung tissue at the left apex. There is dense opacification of inflammatory tissue at the left apex. No extravasation is identified. No contribution of vasculature directed towards the spinal canal. Status post empiric embolization with 500-700 embospheres, no significant contribution of the left bronchial artery to the left apex. IMPRESSION: Status post thoracic aortic and segmental vessel angiogram with empiric embolization of left bronchial artery and superior right bronchial artery, each contributing significant blood flow to abnormal enhancing inflammatory tissue at the left and right apex, respectively. Both artery were embolized to stasis, with no significant opacification of abnormal tissue at the completion. Status post Exoseal deployment. Signed, Dulcy Fanny. Earleen Newport, DO [facility] Radiology Electronically Signed   By: Corrie Mckusick D.O.   On: 10/13/2015 19:21   Ir US Guide Vasc Access Right  10/13/2015  CLINICAL DATA:  39 year old male with a history of chronic pulmonary  infection with MAC, as well as prior resection for Aspergilloma. He has developed hemoptysis, with significant bleeding on the prior night. Bronchoscopy could not be performed because of concerns for the patient's pulmonary status. Bilateral empiric embolization is planned. EXAM: ULTRASOUND GUIDED ACCESS RIGHT COMMON FEMORAL ARTERY THORACIC AORTIC ANGIOGRAM SELECTIVE ANGIOGRAM OF MULTIPLE BILATERAL THORACIC SEGMENTAL VESSELS INCLUDING BILATERAL BRONCHIAL ARTERIES. 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 Date:  11/24/201611/24/2016 3:52  pm FLUOROSCOPY TIME:  22 minutes, 24 seconds MEDICATIONS AND MEDICAL HISTORY: 1.0 mg Versed, 50 mcg fentanyl ANESTHESIA/SEDATION: 73 minutes CONTRAST:  217m OMNIPAQUE IOHEXOL 300 MG/ML  SOLN COMPLICATIONS: None PROCEDURE: Informed consent was obtained from the patient following explanation of the procedure, risks, benefits and alternatives. Specific risks include bleeding, infection, arterial injury, need for further surgery or procedure, kidney injury, contrast reaction, non targeted embolization, neurologic injury/ deficit, cardiopulmonary collapse, death The patient understands, agrees and consents for the procedure. All questions were addressed. A time out was performed. Patient is position in the supine position on the fluoroscopy table. The right inguinal region was prepped and draped in the usual sterile fashion. Maximum sterile protection was used including gown and gloves mask. 1% lidocaine was used for local anesthesia. Ultrasound survey of the right inguinal region was performed with images stored and sent to PACs. A micropuncture needle was used access the right common femoral artery under ultrasound. With excellent arterial blood flow returned, and an .018 micro wire was passed through the needle, observed enter the abdominal aorta under fluoroscopy. The needle was removed, and a micropuncture sheath was placed over the wire. The inner dilator and wire were removed, and an 035 Bentson wire was advanced under fluoroscopy into the abdominal aorta. The sheath was removed and a standard 5 [place] sheath was placed. The dilator was removed and the sheath was flushed. Bentson wire was advanced to the aortic arch, and a pigtail catheter was passed over the wire to the aortic arch. Thoracic aortic angiogram was performed. Pigtail catheter was exchanged for a Mickelson catheter which was used to select multiple segmental vessels of the thoracic arch. Angiogram was performed of each vessel selected. Once  the right bronchial artery was engaged, angiogram was performed. Micro catheter system including a high-flow Renegade catheter was passed into the right bronchial artery, at a safe distance from the origin to avoid refluxing into the aorta. Empiric embolization was then performed with 500 - 700 micro meter embospheres. One vial was used. Micro catheter system was then removed, and a final angiogram was performed through the base catheter. Mickelson catheter was then used to select multiple segmental vessels searching for left bronchial artery. Exchange was made over the Bentson wire for a shunt catheter. The Chung catheter was successful with engaging the origin of left bronchial artery. Angiogram performed. Micro catheter system was then advanced into the left bronchial artery for empiric embolization. Five hundred - 700 micro meter embospheres were used. One vial used. Repeat angiogram was performed. Angiogram of the right common femoral artery was performed. Exoseal was deployed. Patient tolerated the procedure well and remained hemodynamically stable throughout. No complications were encountered and no significant blood loss encountered. FINDINGS: Thoracic aorta demonstrates no dissection flap or aneurysm. No significant atherosclerotic disease along the lumen of the aorta. Multiple segmental vessels identified including 2 right bronchial artery and single left bronchial artery from the aortic angiogram. Superior right bronchial artery angiogram:  Tortuous vasculature without stenosis at the origin. Vessel is of large caliber and supplies a cluster of abnormal vessels at the apex of the lung. Inflammatory tissue at the lung apex a demonstrates dense enhancement with no early draining vein identified. No evidence of arterial contribution towards the spinal canal. Status post microsphere embolization of the right superior bronchial artery there is no significant residual blood flow. Angiogram right supreme  intercostal artery angiogram: No contribution to abnormal lung tissue. Angiogram right superior intercostal angiogram: No contribution to the lung apex. Left T9 segmental vessel angiogram: No contribution to the abnormal lung tissue. No reticular medullary vessel identified. Right inferior bronchial artery angiogram: No contribution to the abnormal vasculature at the right apex. Left T8 segmental vessel angiogram: No contribution to abnormal left-sided lung tissue. Left bronchial artery angiogram: Circuitous vasculature with significant contribution to abnormal lung tissue at the left apex. There is dense opacification of inflammatory tissue at the left apex. No extravasation is identified. No contribution of vasculature directed towards the spinal canal. Status post empiric embolization with 500-700 embospheres, no significant contribution of the left bronchial artery to the left apex. IMPRESSION: Status post thoracic aortic and segmental vessel angiogram with empiric embolization of left bronchial artery and superior right bronchial artery, each contributing significant blood flow to abnormal enhancing inflammatory tissue at the left and right apex, respectively. Both artery were embolized to stasis, with no significant opacification of abnormal tissue at the completion. Status post Exoseal deployment. Signed, Dulcy Fanny. Earleen Newport, DO [facility] Radiology Electronically Signed   By: Corrie Mckusick D.O.   On: 10/13/2015 19:21   Dg Chest Port 1 View  10/14/2015  CLINICAL DATA:  Hemoptysis, mycobacterium avium intracellular complex. EXAM: PORTABLE CHEST 1 VIEW COMPARISON:  October 13, 2015. FINDINGS: Stable cardiomediastinal silhouette. Bilateral upper lobe bulla are again noted and unchanged. Associated scarring in the both upper lobes is noted. Opacity is again noted in the left upper lobe most likely representing fungus ball. No significant pleural effusion is  noted. Bony thorax is unremarkable. IMPRESSION: Stable bilateral bulla formation and scarring is noted in both upper lobes. Soft tissue density is again noted in left upper lobe most likely representing fungus ball as described on prior CT scan. No significant changes noted compared to prior exam. Electronically Signed   By: Marijo Conception, M.D.   On: 10/14/2015 07:41   Dg Chest Portable 1 View  10/13/2015  CLINICAL DATA:  Acute onset of hemoptysis and shortness of breath. Initial encounter. EXAM: PORTABLE CHEST 1 VIEW COMPARISON:  Chest radiograph performed 09/28/2014 FINDINGS: There appears to be an enlarging 3.9 cm mass near the left lung apex. Biapical pleural parenchymal scarring and bullous change are again seen, with somewhat worsening bilateral pulmonary nodularity. Pleural thickening near the left lung mass appears worsened, raising concern for local spread of disease. No pleural effusion or pneumothorax is seen. The cardiomediastinal silhouette is normal in size. No acute osseous abnormalities are identified. A chronic left fourth rib deformity is noted. IMPRESSION: 1. Apparent enlarging 3.9 cm mass near the left lung apex, concerning for malignancy. Pleural thickening near the left lung mass appears worsened, raising concern for local spread of disease. CT of the chest would be helpful for further evaluation, when and as deemed clinically appropriate. 2. Somewhat worsening bilateral pulmonary nodularity noted. Biapical pleural parenchymal scarring and bullous change again noted. Electronically Signed   By: Garald Balding M.D.   On: 10/13/2015 05:22   Ir  [place] Guide Roadmapping  10/13/2015  CLINICAL DATA:  39 year old male with a history of chronic pulmonary infection with MAC, as well as prior resection for Aspergilloma. He has developed hemoptysis, with significant bleeding on the prior night. Bronchoscopy could not be performed because of concerns for the  patient's pulmonary status. Bilateral empiric embolization is planned. EXAM: ULTRASOUND GUIDED ACCESS RIGHT COMMON FEMORAL ARTERY THORACIC AORTIC ANGIOGRAM SELECTIVE ANGIOGRAM OF MULTIPLE BILATERAL THORACIC SEGMENTAL VESSELS INCLUDING BILATERAL BRONCHIAL ARTERIES. 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 Date:  11/24/201611/24/2016 3:52 pm FLUOROSCOPY TIME:  22 minutes, 24 seconds MEDICATIONS AND MEDICAL HISTORY: 1.0 mg Versed, 50 mcg fentanyl ANESTHESIA/SEDATION: 73 minutes CONTRAST:  266m OMNIPAQUE IOHEXOL 300 MG/ML  SOLN COMPLICATIONS: None PROCEDURE: Informed consent was obtained from the patient following explanation of the procedure, risks, benefits and alternatives. Specific risks include bleeding, infection, arterial injury, need for further surgery or procedure, kidney injury, contrast reaction, non targeted embolization, neurologic injury/ deficit, cardiopulmonary collapse, death The patient understands, agrees and consents for the procedure. All questions were addressed. A time out was performed. Patient is position in the supine position on the fluoroscopy table. The right inguinal region was prepped and draped in the usual sterile fashion. Maximum sterile protection was used including gown and gloves mask. 1% lidocaine was used for local anesthesia. Ultrasound survey of the right inguinal region was performed with images stored and sent to PACs. A micropuncture needle was used access the right common femoral artery under ultrasound. With excellent arterial blood flow returned, and an .018 micro wire was passed through the needle, observed enter the abdominal aorta under fluoroscopy. The needle was removed, and a micropuncture sheath was placed over the wire. The inner dilator and wire were removed, and an 035 Bentson wire was advanced under fluoroscopy into the abdominal  aorta. The sheath was removed and a standard 5 [place] sheath was placed. The dilator was removed and the sheath was flushed. Bentson wire was advanced to the aortic arch, and a pigtail catheter was passed over the wire to the aortic arch. Thoracic aortic angiogram was performed. Pigtail catheter was exchanged for a Mickelson catheter which was used to select multiple segmental vessels of the thoracic arch. Angiogram was performed of each vessel selected. Once the right bronchial artery was engaged, angiogram was performed. Micro catheter system including a high-flow Renegade catheter was passed into the right bronchial artery, at a safe distance from the origin to avoid refluxing into the aorta. Empiric embolization was then performed with 500 - 700 micro meter embospheres. One vial was used. Micro catheter system was then removed, and a final angiogram was performed through the base catheter. Mickelson catheter was then used to select multiple segmental vessels searching for left bronchial artery. Exchange was made over the Bentson wire for a shunt catheter. The Chung catheter was successful with engaging the origin of left bronchial artery. Angiogram performed. Micro catheter system was then advanced into the left bronchial artery for empiric embolization. Five hundred - 700 micro meter embospheres were used. One vial used. Repeat angiogram was performed. Angiogram of the right common femoral artery was performed. Exoseal was deployed. Patient tolerated the procedure well and remained hemodynamically stable throughout. No complications were encountered and no significant blood loss encountered. FINDINGS: Thoracic aorta demonstrates no dissection flap or aneurysm. No significant atherosclerotic disease along the lumen of  the aorta. Multiple segmental vessels identified including 2 right bronchial artery and single left bronchial artery from the aortic angiogram. Superior right bronchial artery angiogram:  Tortuous vasculature without stenosis at the origin. Vessel is of large caliber and supplies a cluster of abnormal vessels at the apex of the lung. Inflammatory tissue at the lung apex a demonstrates dense enhancement with no early draining vein identified. No evidence of arterial contribution towards the spinal canal. Status post microsphere embolization of the right superior bronchial artery there is no significant residual blood flow. Angiogram right supreme intercostal artery angiogram: No contribution to abnormal lung tissue. Angiogram right superior intercostal angiogram: No contribution to the lung apex. Left T9 segmental vessel angiogram: No contribution to the abnormal lung tissue. No reticular medullary vessel identified. Right inferior bronchial artery angiogram: No contribution to the abnormal vasculature at the right apex. Left T8 segmental vessel angiogram: No contribution to abnormal left-sided lung tissue. Left bronchial artery angiogram: Circuitous vasculature with significant contribution to abnormal lung tissue at the left apex. There is dense opacification of inflammatory tissue at the left apex. No extravasation is identified. No contribution of vasculature directed towards the spinal canal. Status post empiric embolization with 500-700 embospheres, no significant contribution of the left bronchial artery to the left apex. IMPRESSION: Status post thoracic aortic and segmental vessel angiogram with empiric embolization of left bronchial artery and superior right bronchial artery, each contributing significant blood flow to abnormal enhancing inflammatory tissue at the left and right apex, respectively. Both artery were embolized to stasis, with no significant opacification of abnormal tissue at the completion. Status post Exoseal deployment. Signed, Dulcy Fanny. Earleen Newport, DO [facility]iology Specialists [facility] Radiology Electronically Signed   By: Corrie Mckusick D.O.   On:  10/13/2015 19:21   [place] Guide Roadmapping  10/13/2015  CLINICAL DATA:  39 year old male with a history of chronic pulmonary infection with MAC, as well as prior resection for Aspergilloma. He has developed hemoptysis, with significant bleeding on the prior night. Bronchoscopy could not be performed because of concerns for the patient's pulmonary status. Bilateral empiric embolization is planned. EXAM: ULTRASOUND GUIDED ACCESS RIGHT COMMON FEMORAL ARTERY THORACIC AORTIC ANGIOGRAM SELECTIVE ANGIOGRAM OF MULTIPLE BILATERAL THORACIC SEGMENTAL VESSELS INCLUDING BILATERAL BRONCHIAL ARTERIES. 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 Date:  11/24/201611/24/2016 3:52 pm FLUOROSCOPY TIME:  22 minutes, 24 seconds MEDICATIONS AND MEDICAL HISTORY: 1.0 mg Versed, 50 mcg fentanyl ANESTHESIA/SEDATION: 73 minutes CONTRAST:  253m OMNIPAQUE IOHEXOL 300 MG/ML  SOLN COMPLICATIONS: None PROCEDURE: Informed consent was obtained from the patient following explanation of the procedure, risks, benefits and alternatives. Specific risks include bleeding, infection, arterial injury, need for further surgery or procedure, kidney injury, contrast reaction, non targeted embolization, neurologic injury/ deficit, cardiopulmonary collapse, death The patient understands, agrees and consents for the procedure. All questions were addressed. A time out was performed. Patient is position in the supine position on the fluoroscopy table. The right inguinal region was prepped and draped in the usual sterile fashion. Maximum sterile protection was used including gown and gloves mask. 1% lidocaine was used for local anesthesia. Ultrasound survey of the right inguinal region was performed with images stored and sent to PACs. A micropuncture needle was used access the right common femoral  artery under ultrasound. With excellent arterial blood flow returned, and an .018 micro wire was passed through  the needle, observed enter the abdominal aorta under fluoroscopy. The needle was removed, and a micropuncture sheath was placed over the wire. The inner dilator and wire were removed, and an 035 Bentson wire was advanced under fluoroscopy into the abdominal aorta. The sheath was removed and a standard 5 [place] vascular sheath was placed. The dilator was removed and the sheath was flushed. Bentson wire was advanced to the aortic arch, and a pigtail catheter was passed over the wire to the aortic arch. Thoracic aortic angiogram was performed. Pigtail catheter was exchanged for a Mickelson catheter which was used to select multiple segmental vessels of the thoracic arch. Angiogram was performed of each vessel selected. Once the right bronchial artery was engaged, angiogram was performed. Micro catheter system including a high-flow Renegade catheter was passed into the right bronchial artery, at a safe distance from the origin to avoid refluxing into the aorta. Empiric embolization was then performed with 500 - 700 micro meter embospheres. One vial was used. Micro catheter system was then removed, and a final angiogram was performed through the base catheter. Mickelson catheter was then used to select multiple segmental vessels searching for left bronchial artery. Exchange was made over the Bentson wire for a shunt catheter. The Chung catheter was successful with engaging the origin of left bronchial artery. Angiogram performed. Micro catheter system was then advanced into the left bronchial artery for empiric embolization. Five hundred - 700 micro meter embospheres were used. One vial used. Repeat angiogram was performed. Angiogram of the right common femoral artery was performed. Exoseal was deployed. Patient tolerated the procedure well and remained hemodynamically stable throughout. No complications were  encountered and no significant blood loss encountered. FINDINGS: Thoracic aorta demonstrates no dissection flap or aneurysm. No significant atherosclerotic disease along the lumen of the aorta. Multiple segmental vessels identified including 2 right bronchial artery and single left bronchial artery from the aortic angiogram. Superior right bronchial artery angiogram: Tortuous vasculature without stenosis at the origin. Vessel is of large caliber and supplies a cluster of abnormal vessels at the apex of the lung. Inflammatory tissue at the lung apex a demonstrates dense enhancement with no early draining vein identified. No evidence of arterial contribution towards the spinal canal. Status post microsphere embolization of the right superior bronchial artery there is no significant residual blood flow. Angiogram right supreme intercostal artery angiogram: No contribution to abnormal lung tissue. Angiogram right superior intercostal angiogram: No contribution to the lung apex. Left T9 segmental vessel angiogram: No contribution to the abnormal lung tissue. No reticular medullary vessel identified. Right inferior bronchial artery angiogram: No contribution to the abnormal vasculature at the right apex. Left T8 segmental vessel angiogram: No contribution to abnormal left-sided lung tissue. Left bronchial artery angiogram: Circuitous vasculature with significant contribution to abnormal lung tissue at the left apex. There is dense opacification of inflammatory tissue at the left apex. No extravasation is identified. No contribution of vasculature directed towards the spinal canal. Status post empiric embolization with 500-700 embospheres, no significant contribution of the left bronchial artery to the left apex. IMPRESSION: Status post thoracic aortic and segmental vessel angiogram with empiric embolization of left bronchial artery and superior right bronchial artery, each contributing significant blood flow to abnormal  enhancing inflammatory tissue at the left and right apex, respectively. Both artery were embolized to stasis, with no significant opacification of abnormal tissue at the completion. Status post Exoseal deployment. Signed, Dulcy Fanny. Earleen Newport, DO [facility] and Interventional Radiology Specialists [facility] Radiology Electronically Signed  By: Corrie Mckusick D.O.   On: 10/13/2015 19:21    [place] Lose, MD  [facility] Pager:[phone]  If 7PM-7AM, please contact night-coverage [email or web address] Password [email or web address] 10/17/2015, 9:28 AM   LOS: 4 days

## 2015-10-18 LAB — CULTURE, BLOOD (ROUTINE X 2)
Culture: NO GROWTH
Culture: NO GROWTH

## 2015-10-18 LAB — CULTURE, RESPIRATORY W GRAM STAIN

## 2015-10-18 LAB — CULTURE, RESPIRATORY: CULTURE: NORMAL

## 2015-10-18 NOTE — Progress Notes (Signed)
PULMONARY / CRITICAL CARE MEDICINE   Name: [name] MRN: [id number] DOB: Mar 10, 1976    ADMISSION DATE:  10/13/2015 CONSULTATION DATE:  10/13/15  REFERRING MD :  [facility]   CHIEF COMPLAINT:  Hemoptysis    SUBJECTIVE:  Pt denies further hemoptysis.  No chest pain or shortness of breath.   VITAL SIGNS: BP 115/73 mmHg  Pulse 83  Temp(Src) 98 F (36.7 C) (Oral)  Resp 18  Ht _0  (1.753 m)  Wt 133 lb 9.6 oz (60.6 kg)  BMI 19.72 kg/m2  SpO2 98%  VENTILATOR SETTINGS: Vent Mode:  [-]  FiO2 (%):  [6 %] 6 %  INTAKE / OUTPUT: I/O last 3 completed shifts: In: 960 [P.O.:960] Out: 600 [Urine:600]  PHYSICAL EXAMINATION: General:  Thin male, in no acute distress Neuro:  AAOx4, MAE, normal strength HEENT:  Moist mucus membranes, no jvd Cardiovascular:  S1, S2, No MRG Lungs:  Clear, distant, No wheeze or crackles Abdomen:  NTND, + BS Musculoskeletal:  No acute deformities  Skin:  Intact, multiple tattoos  LABS:  CBC  Recent Labs Lab 10/13/15 0400 10/13/15 0420 10/14/15 0228 10/15/15 0444  WBC 9.7  --  19.0* 12.6*  HGB 15.4 16.0 14.6 13.5  HCT 44.9 47.0 42.9 39.6  PLT 237  --  203 166   Coag's  Recent Labs Lab 10/13/15 0400  APTT 31  INR 0.95   BMET  Recent Labs Lab 10/13/15 0400 10/13/15 0420 10/14/15 0228 10/15/15 0444  NA 139 137 133* 136  K 3.8 3.8 3.6 4.0  CL 104 108 101 105  CO2 28  --  25 24  BUN _1 CREATININE 0.93 0.90 1.00 0.91  GLUCOSE 136* 129* 97 104*   Electrolytes  Recent Labs Lab 10/13/15 0400 10/14/15 0228 10/15/15 0444  CALCIUM 8.4* 8.0* 7.9*   Sepsis Markers  Recent Labs Lab 10/13/15 0416  LATICACIDVEN 1.44   ABG No results for input(s): PHART, PCO2ART, PO2ART in the last 168 hours.   Liver Enzymes  Recent Labs Lab 10/13/15 0400  AST 24  ALT 14*  ALKPHOS 96  BILITOT 1.1  ALBUMIN 3.5   Cardiac Enzymes No results for input(s): TROPONINI, PROBNP in the last 168 hours.    Glucose  Recent Labs Lab 10/13/15 0932  GLUCAP 78    Imaging    STUDIES:  11/24  CTA Chest >> no PE, new dense layering airspace in L apical bulla concerning for fungal ball, numerous scattered nodules, loculated L sided pneumothorax decreased in size, small R basilar pneumothorax, and large bilateral bullae.   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 Underwent bronchial artery embolization 11/25 for hemoptysis.  Pt felt to be too high risk for FOB due to bullous lung disease with concern for prolonged VDRF if intubation required.     ASSESSMENT / PLAN:  PULMONARY A: Hemoptysis -  acute episode 11/24 am, approx 100-200 ml . Bleeding controlled.  High risk for PTX / prolonged vent if bronch S/p bilateral embolization bronchial arteries  Acute Hypoxemia - in setting of hemoptysis Concern for new Fungal Ball - see 11/24 CT, concern for voriconazole resistance with development of new fungal ball on therapy ABPA - on chronic prednisone 10 mg MAC - on azithro / ethambutol Unlikely TB  P:   Pulmonary hygiene Continue chronic medications - azithromycin, ethambutol, prednisone  Intermittent CXR , follow clinically for bleeding Budesonide / Pulmicort  Spiriva  Q6 PRN Albuterol  Sputum for AFB x 3 - pending.  Once third AFB smear reviewed (anticipate 11/29 afternoon), he can be discharged  and follow up with ID / Pulmonary (pulm appt arranged, see d/c section) Appreciate ID input regarding therapy changes, note change to itraconazole 11/28.  He uses [facility] for mail order Rx, will need short term Rx to get him by until mail order can be recieved Await voriconazole trough levels. ID discussing with [facility] regarding change to itraconazole    RENAL A:   S/p contrast for bilateral BA at risk ATN  P:   Trend BMP / UOP  No lasix  GASTROINTESTINAL A:   Protein Calorie Malnutrition  Weight Loss - approx 10 lbs since 08/2015 P:   Regular diet as tolerated  Ensure   HEMATOLOGIC A:   Hemoptyisis P:  Continue home ferrous sulfate  Monitor CBC  SCD's for DVT prophylaxis     Noe Gens, NP-C [facility] Pgr: [phone] or if no answer [phone] 10/18/2015, 9:21 AM

## 2015-10-18 NOTE — Progress Notes (Signed)
PATIENT DETAILS Name: [name] Age: 39 y.o. 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 CT angiogram of the chest was positive for aspergilloma in the left upper lung bulla. Felt to be high risk for pneumothorax/VDRF if bronchoscopy performed-hence IR was consulted for embolization-no further hemoptysis since then. Transferred to hospitalists service on 11/26.  Subjective:  In bed, denies any fever or chills, no chest or abdominal pain. No further hemoptysis. No night sweats feels better.  Assessment/Plan:  Hemoptysis: Felt to be secondary to aspergillosis (fungal ball seen on CT chest 11/24). Felt to be high risk for pneumothorax/VDRF if underwent bronchoscopy-hence IR consulted and patient underwent empiric embolization of bilateral bronchial arteries. No further recurrence of hemoptysis. Sputum AFB 2 negative-but need 1 more negative samples prior to discontinuing isolation. Switched by ID from voriconazole to itraconazole on 10-17-15 (taking prior to admission-on lifelong voriconazole). PCCM & ID following-await further recommendations.   Acute hypoxic respiratory failure: Resolved. Secondary to above, CTA chest negative for PE.  History of aspergilloma status post VATS 2012: On chronic lifelong voriconazole now Switched by ID from voriconazole to itraconazole on 10-17-15 - followed at the infectious disease clinic. Concern for recurrent infection causing hemoptysis-see above.  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 Reviewed outpatient ID notes (Dr. Lucianne Lei dam 08/08/15)-unfortunately patient has recurrence of symptoms when Zithromax/ethambutol discontinued. ID on board here now.  Bullous lung disease: Stable.  Hypothyroidism: Continue Synthroid  GERD: Continue PPI  Severe protein calorie malnutrition: Continue supplements     Disposition: Remain inpatient-home when cleared by pulmonology/ID    Antimicrobial agents  See below  Anti-infectives    Start     Dose/Rate Route Frequency Ordered Stop   10/17/15 1430  itraconazole (SPORANOX) capsule 200 mg     200 mg Oral 2 times daily 10/17/15 1358     10/14/15 1000  azithromycin (ZITHROMAX) tablet 500 mg     500 mg Oral Daily 10/14/15 0836     10/13/15 1500  ethambutol (MYAMBUTOL) tablet 1,000 mg     1,000 mg Oral Daily 10/13/15 1057     10/13/15 1000  voriconazole (VFEND) tablet 300 mg  Status:  Discontinued     300 mg Oral Every 12 hours 10/13/15 0945 10/17/15 1358   10/13/15 0800  voriconazole (VFEND) 380 mg in sodium chloride 0.9 % 100 mL IVPB  Status:  Discontinued     6 mg/kg  63 kg 69 mL/hr over 120 Minutes Intravenous Every 12 hours 10/13/15 0638 10/13/15 0641   10/13/15 0745  azithromycin (ZITHROMAX) 500 mg in dextrose 5 % 250 mL IVPB     500 mg 250 mL/hr over 60 Minutes Intravenous  Once 10/13/15 0737 10/13/15 0850   10/13/15 0743  azithromycin (ZITHROMAX) 500 MG injection    Comments:  Rolla Etienne   : cabinet override      10/13/15 0743 10/13/15 0750      DVT Prophylaxis:  SCD's  Code Status: Full code  Family Communication None at bedside  Procedures: 11/24>>empiric embolization of bilateral bronchial arteries  CONSULTS:   pulmonary/intensive care and  IR, ID  Time spent 30 minutes-Greater than 50% of this time was spent in counseling, explanation of diagnosis, planning of further management, and coordination of care.  MEDICATIONS: Scheduled Meds: . arformoterol  15 mcg Nebulization BID  .  azithromycin  500 mg Oral Daily  . budesonide  0.5 mg Nebulization BID  . ethambutol  1,000 mg Oral Daily  . feeding supplement (ENSURE ENLIVE)  237 mL Oral BID BM  . ferrous sulfate  325 mg Oral Daily  . itraconazole  200 mg Oral BID  . levothyroxine  50 mcg Oral QAC breakfast  . multivitamin with minerals  1 tablet Oral Daily  . predniSONE  10 mg Oral Daily  . tiotropium  18 mcg Inhalation Daily   Continuous Infusions: . sodium chloride 10 mL/hr at 10/15/15 1617   PRN Meds:.acetaminophen, albuterol    PHYSICAL EXAM:  Vital signs in last 24 hours: Filed Vitals:   10/17/15 1805 10/17/15 2042 10/17/15 2208 10/18/15 0602  BP: 124/73  114/73 115/73  Pulse: 96  82 83  Temp:   98.4 F (36.9 C) 98 F (36.7 C)  TempSrc:   Oral Oral  Resp:   17 18  Height:      Weight:      SpO2: 96% 95% 95% 98%    Weight change:  Filed Weights   10/13/15 0600 10/13/15 0922 10/14/15 0327  Weight: 63 kg (138 lb 14.2 oz) 59.3 kg (130 lb 11.7 oz) 60.6 kg (133 lb 9.6 oz)   Body mass index is 19.72 kg/(m^2).   Gen Exam: Awake and alert with clear speech.  Neck: Supple, No JVD.   Chest: B/L Clear.   CVS: S1 S2 Regular, no murmurs.  Abdomen: soft, BS +, non tender, non distended.  Extremities: no edema, lower extremities warm to touch. Neurologic: Non Focal.   Skin: No Rash.   Wounds: N/A.   Intake/Output from previous day:  Intake/Output Summary (Last 24 hours) at 10/18/15 0931 Last data filed at 10/18/15 0604  Gross per 24 hour  Intake    720 ml  Output    600 ml  Net    120 ml     LAB RESULTS: CBC  Recent Labs Lab 10/13/15 0400 10/13/15 0420 10/14/15 0228 10/15/15 0444  WBC 9.7  --  19.0* 12.6*  HGB 15.4 16.0 14.6 13.5  HCT 44.9 47.0 42.9 39.6  PLT 237  --  203 166  MCV 88.4  --  90.3 89.6  MCH 30.3  --  30.7 30.5  MCHC 34.3  --  34.0 34.1  RDW 13.3  --  13.3 13.2  LYMPHSABS 1.9  --   --  1.0  MONOABS 0.4  --   --  0.8  EOSABS 0.1  --   --  0.1  BASOSABS 0.0  --    --  0.0    Chemistries   Recent Labs Lab 10/13/15 0400 10/13/15 0420 10/14/15 0228 10/15/15 0444  NA 139 137 133* 136  K 3.8 3.8 3.6 4.0  CL 104 108 101 105  CO2 28  --  25 24  GLUCOSE 136* 129* 97 104*  BUN _0 CREATININE 0.93 0.90 1.00 0.91  CALCIUM 8.4*  --  8.0* 7.9*    CBG:  Recent Labs Lab 10/13/15 0932  GLUCAP 78    GFR Estimated Creatinine Clearance: 91.6 mL/min (by C-G formula based on Cr of 0.91).  Coagulation profile  Recent Labs Lab 10/13/15 0400  INR 0.95    Cardiac Enzymes No results for input(s): CKMB, TROPONINI, MYOGLOBIN in the last 168 hours.  Invalid input(s): CK  Invalid input(s): POCBNP No results for input(s): DDIMER in the last 72 hours. No results for input(s): HGBA1C in the last 72 hours. No results for input(s): CHOL, HDL, LDLCALC, TRIG, CHOLHDL, LDLDIRECT in the last 72 hours. No results for input(s): TSH, T4TOTAL, T3FREE, THYROIDAB in the last 72 hours.  Invalid input(s): FREET3 No results for input(s): VITAMINB12, FOLATE, FERRITIN, TIBC, IRON, RETICCTPCT in the last 72 hours. No results for input(s): LIPASE, AMYLASE in the last 72 hours.  Urine Studies No results for input(s): UHGB, CRYS in the last 72 hours.  Invalid input(s): UACOL, UAPR, USPG, UPH, UTP, UGL, UKET, UBIL, UNIT, UROB, ULEU, UEPI, UWBC, URBC, UBAC, CAST, UCOM, BILUA  MICROBIOLOGY: Recent Results (from the past 240 hour(s))  Blood culture (routine x 2)     Status: None (Preliminary result)   Collection Time: 10/13/15  4:30 AM  Result Value Ref Range Status   Specimen Description BLOOD RIGHT ANTECUBITAL  Final   Special Requests BOTTLES DRAWN AEROBIC AND ANAEROBIC 5CC  Final   Culture   Final    NO GROWTH 4 DAYS Performed at [facility]    Report Status PENDING  Incomplete  Blood culture (routine x 2)     Status: None (Preliminary result)   Collection Time: 10/13/15  4:35 AM  Result Value Ref Range Status   Specimen Description BLOOD  BLOOD RIGHT FOREARM  Final   Special Requests BOTTLES DRAWN AEROBIC AND ANAEROBIC 5CC  Final   Culture   Final    NO GROWTH 4 DAYS Performed at [facility]    Report Status PENDING  Incomplete  MRSA PCR Screening     Status: None   Collection Time: 10/13/15  9:15 AM  Result Value Ref Range Status   MRSA by PCR NEGATIVE NEGATIVE Final    Comment:        The GeneXpert MRSA Assay (FDA approved for NASAL specimens only), is one component of a comprehensive MRSA colonization surveillance program. It is not intended to diagnose MRSA infection nor to guide or monitor treatment for MRSA infections.   AFB culture with smear     Status: None (Preliminary result)   Collection Time: 10/13/15 12:00 PM  Result Value Ref Range Status   Specimen Description SPUTUM  Final   Special Requests Immunocompromised  Final   Acid Fast Smear   Final    NO ACID FAST BACILLI SEEN Performed at [facility]    Culture   Final    CULTURE WILL BE EXAMINED FOR 6 WEEKS BEFORE ISSUING A FINAL REPORT Performed at [facility]    Report Status PENDING  Incomplete  Culture, expectorated sputum-assessment     Status: None   Collection Time: 10/13/15 12:00 PM  Result Value Ref Range Status   Specimen Description EXPECTORATED SPUTUM  Final   Special Requests Immunocompromised  Final   Sputum evaluation   Final    THIS SPECIMEN IS ACCEPTABLE. RESPIRATORY CULTURE REPORT TO FOLLOW.   Report Status 10/13/2015 FINAL  Final  Culture, respiratory (NON-Expectorated)     Status: None   Collection Time: 10/13/15 12:00 PM  Result Value Ref Range Status   Specimen Description SPUTUM  Final   Special Requests NONE  Final   Gram Stain   Final    ABUNDANT WBC PRESENT,BOTH PMN AND MONONUCLEAR RARE SQUAMOUS EPITHELIAL CELLS  PRESENT MODERATE GRAM POSITIVE COCCI IN PAIRS IN CHAINS IN CLUSTERS Performed at [facility]    Culture   Final    NORMAL OROPHARYNGEAL FLORA Performed at [facility]    Report Status 10/16/2015 FINAL  Final  Culture, expectorated sputum-assessment     Status: None   Collection Time: 10/15/15  2:26 AM  Result Value Ref Range Status   Specimen Description SPUTUM  Final   Special Requests Normal  Final   Sputum evaluation   Final    THIS SPECIMEN IS ACCEPTABLE. RESPIRATORY CULTURE REPORT TO FOLLOW.   Report Status 10/15/2015 FINAL  Final  Culture, respiratory (NON-Expectorated)     Status: None   Collection Time: 10/15/15  2:26 AM  Result Value Ref Range Status   Specimen Description EXPECTORATED SPUTUM  Final   Special Requests NONE  Final   Gram Stain   Final    RARE WBC PRESENT, PREDOMINANTLY PMN RARE SQUAMOUS EPITHELIAL CELLS PRESENT MODERATE GRAM POSITIVE COCCI IN PAIRS AND CHAINS Performed at [facility]    Culture   Final    NORMAL OROPHARYNGEAL FLORA Performed at [facility]    Report Status 10/18/2015 FINAL  Final  AFB culture with smear     Status: None (Preliminary result)   Collection Time: 10/16/15  2:27 PM  Result Value Ref Range Status   Specimen Description SPUTUM  Final   Special Requests NONE  Final   Acid Fast Smear   Final    NO ACID FAST BACILLI SEEN Performed at [facility]    Culture   Final    CULTURE WILL BE EXAMINED FOR 6 WEEKS BEFORE ISSUING A FINAL REPORT Performed at [facility]    Report Status PENDING  Incomplete    RADIOLOGY STUDIES/RESULTS: Ct Angio Chest Pe W/cm &/or Wo Cm  10/13/2015  CLINICAL DATA:  Acute onset of hemoptysis, chest tightness and shortness of breath. Initial encounter. EXAM: CT ANGIOGRAPHY CHEST WITH CONTRAST TECHNIQUE: Multidetector CT imaging of the chest was performed using the standard protocol during bolus administration of intravenous contrast. Multiplanar CT image reconstructions and MIPs were obtained to evaluate the vascular anatomy. CONTRAST:  158m OMNIPAQUE IOHEXOL 350 MG/ML SOLN COMPARISON:  Chest radiograph performed  earlier today at 4:25 a.m., and CT of the chest performed 03/05/2011 FINDINGS: There is no evidence of pulmonary embolus. The previously noted loculated left-sided pneumothorax has decreased significantly in size. The large bilateral bullae are again noted. There is new dense layering airspace opacification at the large left apical bulla, extending inferiorly along the posterior aspect of the left upper lobe, suspicious for acute fungal infection given its appearance. 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 This is in a slightly different distribution from the prior study, but appears relatively stable. There is no evidence of pleural effusion. A new small right basilar pneumothorax is noted. Vague borderline prominent aortopulmonary window and subcarinal nodes are suggested, measuring up to 1.1 cm in short axis. The mediastinum is otherwise unremarkable. No pericardial effusion is identified. The great vessels are grossly unremarkable. No axillary lymphadenopathy is seen. The thyroid gland is unremarkable in appearance. The visualized portions of the liver and spleen are unremarkable. The visualized portions of the pancreas, stomach, adrenal glands and left kidney are within normal limits. No acute osseous abnormalities are seen. Review of the MIP images confirms the above findings. IMPRESSION: 1. No evidence of pulmonary embolus. 2. New dense  layering airspace opacification at the patient's large left apical bulla, extending inferiorly along the posterior aspect of the left upper lobe. This is suspicious for acute fungal infection (i.e., a fungal ball), given its appearance. 3. 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 4. Previously noted loculated left-sided pneumothorax has decreased significantly in size. 5. New small right basilar pneumothorax noted. 6.  Underlying large bilateral bullae again noted. 7. Suggestion of mildly prominent mediastinal nodes. These results were called by telephone at the time of interpretation on 10/13/2015 at 6:05 am to Dr. Pryor Curia, who verbally acknowledged these results. Electronically Signed   By: Garald Balding M.D.   On: 10/13/2015 06:05   Ir Aorta/thoracic  10/13/2015  CLINICAL DATA:  39 year old male with a history of chronic pulmonary infection with MAC, as well as prior resection for Aspergilloma. He has developed hemoptysis, with significant bleeding on the prior night. Bronchoscopy could not be performed because of concerns for the patient's pulmonary status. Bilateral empiric embolization is planned. EXAM: ULTRASOUND GUIDED ACCESS RIGHT COMMON FEMORAL ARTERY THORACIC AORTIC ANGIOGRAM SELECTIVE ANGIOGRAM OF MULTIPLE BILATERAL THORACIC SEGMENTAL VESSELS INCLUDING BILATERAL BRONCHIAL ARTERIES. 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 Date:  11/24/201611/24/2016 3:52 pm FLUOROSCOPY TIME:  22 minutes, 24 seconds MEDICATIONS AND MEDICAL HISTORY: 1.0 mg Versed, 50 mcg fentanyl ANESTHESIA/SEDATION: 73 minutes CONTRAST:  226m OMNIPAQUE IOHEXOL 300 MG/ML  SOLN COMPLICATIONS: None PROCEDURE: Informed consent was obtained from the patient following explanation of the procedure, risks, benefits and alternatives. Specific risks include bleeding, infection, arterial injury, need for further surgery or procedure, kidney injury, contrast reaction, non targeted embolization, neurologic injury/ deficit, cardiopulmonary collapse, death The patient understands, agrees and consents for the procedure. All questions were addressed. A time out was performed. Patient is position in the supine position on the fluoroscopy table. The right inguinal region was prepped and draped in the usual sterile fashion. Maximum  sterile protection was used including gown and gloves mask. 1% lidocaine was used for local anesthesia. Ultrasound survey of the right inguinal region was performed with images stored and sent to PACs. A micropuncture needle was used access the right common femoral artery under ultrasound. With excellent arterial blood flow returned, and an .018 micro wire was passed through the needle, observed enter the abdominal aorta under fluoroscopy. The needle was removed, and a micropuncture sheath was placed over the wire. The inner dilator and wire were removed, and an 035 Bentson wire was advanced under fluoroscopy into the abdominal aorta. The sheath was removed and a standard 5 [place] sheath was placed. The dilator was removed and the sheath was flushed. Bentson wire was advanced to the aortic arch, and a pigtail catheter was passed over the wire to the aortic arch. Thoracic aortic angiogram was performed. Pigtail catheter was exchanged for a Mickelson catheter which was used to select multiple segmental vessels of the thoracic arch. Angiogram was performed of each vessel selected. Once the right bronchial artery was engaged, angiogram was performed. Micro catheter system including a high-flow Renegade catheter was passed into the right bronchial artery, at a safe distance from the origin to avoid refluxing into the aorta. Empiric embolization was then performed with 500 - 700 micro meter embospheres. One vial was used. Micro catheter system was then removed, and a final angiogram was performed through the base catheter. Mickelson catheter was then used to  select multiple segmental vessels searching for left bronchial artery. Exchange was made over the Bentson wire for a shunt catheter. The Chung catheter was successful with engaging the origin of left bronchial artery. Angiogram performed. Micro catheter system was then advanced into the left bronchial artery for empiric embolization. Five hundred - 700 micro  meter embospheres were used. One vial used. Repeat angiogram was performed. Angiogram of the right common femoral artery was performed. Exoseal was deployed. Patient tolerated the procedure well and remained hemodynamically stable throughout. No complications were encountered and no significant blood loss encountered. FINDINGS: Thoracic aorta demonstrates no dissection flap or aneurysm. No significant atherosclerotic disease along the lumen of the aorta. Multiple segmental vessels identified including 2 right bronchial artery and single left bronchial artery from the aortic angiogram. Superior right bronchial artery angiogram: Tortuous vasculature without stenosis at the origin. Vessel is of large caliber and supplies a cluster of abnormal vessels at the apex of the lung. Inflammatory tissue at the lung apex a demonstrates dense enhancement with no early draining vein identified. No evidence of arterial contribution towards the spinal canal. Status post microsphere embolization of the right superior bronchial artery there is no significant residual blood flow. Angiogram right supreme intercostal artery angiogram: No contribution to abnormal lung tissue. Angiogram right superior intercostal angiogram: No contribution to the lung apex. Left T9 segmental vessel angiogram: No contribution to the abnormal lung tissue. No reticular medullary vessel identified. Right inferior bronchial artery angiogram: No contribution to the abnormal vasculature at the right apex. Left T8 segmental vessel angiogram: No contribution to abnormal left-sided lung tissue. Left bronchial artery angiogram: Circuitous vasculature with significant contribution to abnormal lung tissue at the left apex. There is dense opacification of inflammatory tissue at the left apex. No extravasation is identified. No contribution of vasculature directed towards the spinal canal. Status post empiric embolization with 500-700 embospheres, no significant  contribution of the left bronchial artery to the left apex. IMPRESSION: Status post thoracic aortic and segmental vessel angiogram with empiric embolization of left bronchial artery and superior right bronchial artery, each contributing significant blood flow to abnormal enhancing inflammatory tissue at the left and right apex, respectively. Both artery were embolized to stasis, with no significant opacification of abnormal tissue at the completion. Status post Exoseal deployment. Signed, Dulcy Fanny. Earleen Newport, DO [facility] Electronically Signed   By: Corrie Mckusick D.O.   On: 10/13/2015 19:21   Ir Angiogram Selective Each Additional Vessel  10/13/2015  CLINICAL DATA:  39 year old male with a history of chronic pulmonary infection with MAC, as well as prior resection for Aspergilloma. He has developed hemoptysis, with significant bleeding on the prior night. Bronchoscopy could not be performed because of concerns for the patient's pulmonary status. Bilateral empiric embolization is planned. EXAM: ULTRASOUND GUIDED ACCESS RIGHT COMMON FEMORAL ARTERY THORACIC AORTIC ANGIOGRAM SELECTIVE ANGIOGRAM OF MULTIPLE BILATERAL THORACIC SEGMENTAL VESSELS INCLUDING BILATERAL BRONCHIAL ARTERIES. 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 Date:  11/24/201611/24/2016 3:52 pm FLUOROSCOPY TIME:  22 minutes, 24 seconds MEDICATIONS AND MEDICAL HISTORY: 1.0 mg Versed, 50 mcg fentanyl ANESTHESIA/SEDATION: 73 minutes CONTRAST:  222m OMNIPAQUE IOHEXOL 300 MG/ML  SOLN COMPLICATIONS: None PROCEDURE: Informed consent was obtained from the patient following explanation of the procedure, risks, benefits and alternatives. Specific risks include bleeding, infection, arterial injury, need for further surgery or procedure, kidney injury, contrast reaction, non  targeted embolization,  neurologic injury/ deficit, cardiopulmonary collapse, death The patient understands, agrees and consents for the procedure. All questions were addressed. A time out was performed. Patient is position in the supine position on the fluoroscopy table. The right inguinal region was prepped and draped in the usual sterile fashion. Maximum sterile protection was used including gown and gloves mask. 1% lidocaine was used for local anesthesia. Ultrasound survey of the right inguinal region was performed with images stored and sent to PACs. A micropuncture needle was used access the right common femoral artery under ultrasound. With excellent arterial blood flow returned, and an .018 micro wire was passed through the needle, observed enter the abdominal aorta under fluoroscopy. The needle was removed, and a micropuncture sheath was placed over the wire. The inner dilator and wire were removed, and an 035 Bentson wire was advanced under fluoroscopy into the abdominal aorta. The sheath was removed and a standard 5 [place] vascular sheath was placed. The dilator was removed and the sheath was flushed. Bentson wire was advanced to the aortic arch, and a pigtail catheter was passed over the wire to the aortic arch. Thoracic aortic angiogram was performed. Pigtail catheter was exchanged for a Mickelson catheter which was used to select multiple segmental vessels of the thoracic arch. Angiogram was performed of each vessel selected. Once the right bronchial artery was engaged, angiogram was performed. Micro catheter system including a high-flow Renegade catheter was passed into the right bronchial artery, at a safe distance from the origin to avoid refluxing into the aorta. Empiric embolization was then performed with 500 - 700 micro meter embospheres. One vial was used. Micro catheter system was then removed, and a final angiogram was performed through the base catheter. Mickelson catheter was then used to  select multiple segmental vessels searching for left bronchial artery. Exchange was made over the Bentson wire for a shunt catheter. The Chung catheter was successful with engaging the origin of left bronchial artery. Angiogram performed. Micro catheter system was then advanced into the left bronchial artery for empiric embolization. Five hundred - 700 micro meter embospheres were used. One vial used. Repeat angiogram was performed. Angiogram of the right common femoral artery was performed. Exoseal was deployed. Patient tolerated the procedure well and remained hemodynamically stable throughout. No complications were encountered and no significant blood loss encountered. FINDINGS: Thoracic aorta demonstrates no dissection flap or aneurysm. No significant atherosclerotic disease along the lumen of the aorta. Multiple segmental vessels identified including 2 right bronchial artery and single left bronchial artery from the aortic angiogram. Superior right bronchial artery angiogram: Tortuous vasculature without stenosis at the origin. Vessel is of large caliber and supplies a cluster of abnormal vessels at the apex of the lung. Inflammatory tissue at the lung apex a demonstrates dense enhancement with no early draining vein identified. No evidence of arterial contribution towards the spinal canal. Status post microsphere embolization of the right superior bronchial artery there is no significant residual blood flow. Angiogram right supreme intercostal artery angiogram: No contribution to abnormal lung tissue. Angiogram right superior intercostal angiogram: No contribution to the lung apex. Left T9 segmental vessel angiogram: No contribution to the abnormal lung tissue. No reticular medullary vessel identified. Right inferior bronchial artery angiogram: No contribution to the abnormal vasculature at the right apex. Left T8 segmental vessel angiogram: No contribution to abnormal left-sided lung tissue. Left bronchial  artery angiogram: Circuitous vasculature with significant contribution to abnormal lung tissue at the left apex. There is dense opacification of inflammatory tissue  at the left apex. No extravasation is identified. No contribution of vasculature directed towards the spinal canal. Status post empiric embolization with 500-700 embospheres, no significant contribution of the left bronchial artery to the left apex. IMPRESSION: Status post thoracic aortic and segmental vessel angiogram with empiric embolization of left bronchial artery and superior right bronchial artery, each contributing significant blood flow to abnormal enhancing inflammatory tissue at the left and right apex, respectively. Both artery were embolized to stasis, with no significant opacification of abnormal tissue at the completion. Status post Exoseal deployment. Signed, Dulcy Fanny. Earleen Newport, DO [facility] Radiology Electronically Signed   By: Corrie Mckusick D.O.   On: 10/13/2015 19:21   Ir Angiogram Selective Each Additional Vessel  10/13/2015  CLINICAL DATA:  39 year old male with a history of chronic pulmonary infection with MAC, as well as prior resection for Aspergilloma. He has developed hemoptysis, with significant bleeding on the prior night. Bronchoscopy could not be performed because of concerns for the patient's pulmonary status. Bilateral empiric embolization is planned. EXAM: ULTRASOUND GUIDED ACCESS RIGHT COMMON FEMORAL ARTERY THORACIC AORTIC ANGIOGRAM SELECTIVE ANGIOGRAM OF MULTIPLE BILATERAL THORACIC SEGMENTAL VESSELS INCLUDING BILATERAL BRONCHIAL ARTERIES. 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 Date:  11/24/201611/24/2016 3:52 pm FLUOROSCOPY TIME:  22 minutes, 24 seconds MEDICATIONS AND MEDICAL HISTORY: 1.0 mg Versed, 50 mcg fentanyl ANESTHESIA/SEDATION:  73 minutes CONTRAST:  259m OMNIPAQUE IOHEXOL 300 MG/ML  SOLN COMPLICATIONS: None PROCEDURE: Informed consent was obtained from the patient following explanation of the procedure, risks, benefits and alternatives. Specific risks include bleeding, infection, arterial injury, need for further surgery or procedure, kidney injury, contrast reaction, non targeted embolization, neurologic injury/ deficit, cardiopulmonary collapse, death The patient understands, agrees and consents for the procedure. All questions were addressed. A time out was performed. Patient is position in the supine position on the fluoroscopy table. The right inguinal region was prepped and draped in the usual sterile fashion. Maximum sterile protection was used including gown and gloves mask. 1% lidocaine was used for local anesthesia. Ultrasound survey of the right inguinal region was performed with images stored and sent to PACs. A micropuncture needle was used access the right common femoral artery under ultrasound. With excellent arterial blood flow returned, and an .018 micro wire was passed through the needle, observed enter the abdominal aorta under fluoroscopy. The needle was removed, and a micropuncture sheath was placed over the wire. The inner dilator and wire were removed, and an 035 Bentson wire was advanced under fluoroscopy into the abdominal aorta. The sheath was removed and a standard 5 [place] sheath was placed. The dilator was removed and the sheath was flushed. Bentson wire was advanced to the aortic arch, and a pigtail catheter was passed over the wire to the aortic arch. Thoracic aortic angiogram was performed. Pigtail catheter was exchanged for a Mickelson catheter which was used to select multiple segmental vessels of the thoracic arch. Angiogram was performed of each vessel selected. Once the right bronchial artery was engaged, angiogram was performed. Micro catheter system including a high-flow Renegade catheter  was passed into the right bronchial artery, at a safe distance from the origin to avoid refluxing into the aorta. Empiric embolization was then performed with 500 - 700 micro meter embospheres. One vial was used. Micro catheter system was then removed, and a final angiogram was performed through the base catheter. Mickelson catheter was  then used to select multiple segmental vessels searching for left bronchial artery. Exchange was made over the Bentson wire for a shunt catheter. The Chung catheter was successful with engaging the origin of left bronchial artery. Angiogram performed. Micro catheter system was then advanced into the left bronchial artery for empiric embolization. Five hundred - 700 micro meter embospheres were used. One vial used. Repeat angiogram was performed. Angiogram of the right common femoral artery was performed. Exoseal was deployed. Patient tolerated the procedure well and remained hemodynamically stable throughout. No complications were encountered and no significant blood loss encountered. FINDINGS: Thoracic aorta demonstrates no dissection flap or aneurysm. No significant atherosclerotic disease along the lumen of the aorta. Multiple segmental vessels identified including 2 right bronchial artery and single left bronchial artery from the aortic angiogram. Superior right bronchial artery angiogram: Tortuous vasculature without stenosis at the origin. Vessel is of large caliber and supplies a cluster of abnormal vessels at the apex of the lung. Inflammatory tissue at the lung apex a demonstrates dense enhancement with no early draining vein identified. No evidence of arterial contribution towards the spinal canal. Status post microsphere embolization of the right superior bronchial artery there is no significant residual blood flow. Angiogram right supreme intercostal artery angiogram: No contribution to abnormal lung tissue. Angiogram right superior intercostal angiogram: No contribution  to the lung apex. Left T9 segmental vessel angiogram: No contribution to the abnormal lung tissue. No reticular medullary vessel identified. Right inferior bronchial artery angiogram: No contribution to the abnormal vasculature at the right apex. Left T8 segmental vessel angiogram: No contribution to abnormal left-sided lung tissue. Left bronchial artery angiogram: Circuitous vasculature with significant contribution to abnormal lung tissue at the left apex. There is dense opacification of inflammatory tissue at the left apex. No extravasation is identified. No contribution of vasculature directed towards the spinal canal. Status post empiric embolization with 500-700 embospheres, no significant contribution of the left bronchial artery to the left apex. IMPRESSION: Status post thoracic aortic and segmental vessel angiogram with empiric embolization of left bronchial artery and superior right bronchial artery, each contributing significant blood flow to abnormal enhancing inflammatory tissue at the left and right apex, respectively. Both artery were embolized to stasis, with no significant opacification of abnormal tissue at the completion. Status post Exoseal deployment. Signed, Dulcy Fanny. Earleen Newport, DO [facility] Radiology Electronically Signed   By: Corrie Mckusick D.O.   On: 10/13/2015 19:21   Ir Angiogram Selective Each Additional Vessel  10/13/2015  CLINICAL DATA:  39 year old male with a history of chronic pulmonary infection with MAC, as well as prior resection for Aspergilloma. He has developed hemoptysis, with significant bleeding on the prior night. Bronchoscopy could not be performed because of concerns for the patient's pulmonary status. Bilateral empiric embolization is planned. EXAM: ULTRASOUND GUIDED ACCESS RIGHT COMMON FEMORAL ARTERY THORACIC AORTIC ANGIOGRAM SELECTIVE ANGIOGRAM OF MULTIPLE BILATERAL THORACIC SEGMENTAL VESSELS INCLUDING BILATERAL  BRONCHIAL ARTERIES. 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 Date:  11/24/201611/24/2016 3:52 pm FLUOROSCOPY TIME:  22 minutes, 24 seconds MEDICATIONS AND MEDICAL HISTORY: 1.0 mg Versed, 50 mcg fentanyl ANESTHESIA/SEDATION: 73 minutes CONTRAST:  297m OMNIPAQUE IOHEXOL 300 MG/ML  SOLN COMPLICATIONS: None PROCEDURE: Informed consent was obtained from the patient following explanation of the procedure, risks, benefits and alternatives. Specific risks include bleeding, infection, arterial injury, need for further surgery or procedure, kidney injury, contrast reaction, non  targeted embolization, neurologic injury/ deficit, cardiopulmonary collapse, death The patient understands, agrees and consents for the procedure. All questions were addressed. A time out was performed. Patient is position in the supine position on the fluoroscopy table. The right inguinal region was prepped and draped in the usual sterile fashion. Maximum sterile protection was used including gown and gloves mask. 1% lidocaine was used for local anesthesia. Ultrasound survey of the right inguinal region was performed with images stored and sent to PACs. A micropuncture needle was used access the right common femoral artery under ultrasound. With excellent arterial blood flow returned, and an .018 micro wire was passed through the needle, observed enter the abdominal aorta under fluoroscopy. The needle was removed, and a micropuncture sheath was placed over the wire. The inner dilator and wire were removed, and an 035 Bentson wire was advanced under fluoroscopy into the abdominal aorta. The sheath was removed and a standard 5 [place] vascular sheath was placed. The dilator was removed and the sheath was flushed. Bentson wire was advanced to the aortic arch, and a pigtail catheter was passed over the wire to the aortic arch.  Thoracic aortic angiogram was performed. Pigtail catheter was exchanged for a Mickelson catheter which was used to select multiple segmental vessels of the thoracic arch. Angiogram was performed of each vessel selected. Once the right bronchial artery was engaged, angiogram was performed. Micro catheter system including a high-flow Renegade catheter was passed into the right bronchial artery, at a safe distance from the origin to avoid refluxing into the aorta. Empiric embolization was then performed with 500 - 700 micro meter embospheres. One vial was used. Micro catheter system was then removed, and a final angiogram was performed through the base catheter. Mickelson catheter was then used to select multiple segmental vessels searching for left bronchial artery. Exchange was made over the Bentson wire for a shunt catheter. The Chung catheter was successful with engaging the origin of left bronchial artery. Angiogram performed. Micro catheter system was then advanced into the left bronchial artery for empiric embolization. Five hundred - 700 micro meter embospheres were used. One vial used. Repeat angiogram was performed. Angiogram of the right common femoral artery was performed. Exoseal was deployed. Patient tolerated the procedure well and remained hemodynamically stable throughout. No complications were encountered and no significant blood loss encountered. FINDINGS: Thoracic aorta demonstrates no dissection flap or aneurysm. No significant atherosclerotic disease along the lumen of the aorta. Multiple segmental vessels identified including 2 right bronchial artery and single left bronchial artery from the aortic angiogram. Superior right bronchial artery angiogram: Tortuous vasculature without stenosis at the origin. Vessel is of large caliber and supplies a cluster of abnormal vessels at the apex of the lung. Inflammatory tissue at the lung apex a demonstrates dense enhancement with no early draining vein  identified. No evidence of arterial contribution towards the spinal canal. Status post microsphere embolization of the right superior bronchial artery there is no significant residual blood flow. Angiogram right supreme intercostal artery angiogram: No contribution to abnormal lung tissue. Angiogram right superior intercostal angiogram: No contribution to the lung apex. Left T9 segmental vessel angiogram: No contribution to the abnormal lung tissue. No reticular medullary vessel identified. Right inferior bronchial artery angiogram: No contribution to the abnormal vasculature at the right apex. Left T8 segmental vessel angiogram: No contribution to abnormal left-sided lung tissue. Left bronchial artery angiogram: Circuitous vasculature with significant contribution to abnormal lung tissue at the left apex. There is dense opacification of  inflammatory tissue at the left apex. No extravasation is identified. No contribution of vasculature directed towards the spinal canal. Status post empiric embolization with 500-700 embospheres, no significant contribution of the left bronchial artery to the left apex. IMPRESSION: Status post thoracic aortic and segmental vessel angiogram with empiric embolization of left bronchial artery and superior right bronchial artery, each contributing significant blood flow to abnormal enhancing inflammatory tissue at the left and right apex, respectively. Both artery were embolized to stasis, with no significant opacification of abnormal tissue at the completion. Status post Exoseal deployment. Signed, Dulcy Fanny. Earleen Newport, DO [facility] Radiology Electronically Signed   By: Corrie Mckusick D.O.   On: 10/13/2015 19:21   Ir Angiogram Selective Each Additional Vessel  10/13/2015  CLINICAL DATA:  39 year old male with a history of chronic pulmonary infection with MAC, as well as prior resection for Aspergilloma. He has developed hemoptysis, with  significant bleeding on the prior night. Bronchoscopy could not be performed because of concerns for the patient's pulmonary status. Bilateral empiric embolization is planned. EXAM: ULTRASOUND GUIDED ACCESS RIGHT COMMON FEMORAL ARTERY THORACIC AORTIC ANGIOGRAM SELECTIVE ANGIOGRAM OF MULTIPLE BILATERAL THORACIC SEGMENTAL VESSELS INCLUDING BILATERAL BRONCHIAL ARTERIES. 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 Date:  11/24/201611/24/2016 3:52 pm FLUOROSCOPY TIME:  22 minutes, 24 seconds MEDICATIONS AND MEDICAL HISTORY: 1.0 mg Versed, 50 mcg fentanyl ANESTHESIA/SEDATION: 73 minutes CONTRAST:  275m OMNIPAQUE IOHEXOL 300 MG/ML  SOLN COMPLICATIONS: None PROCEDURE: Informed consent was obtained from the patient following explanation of the procedure, risks, benefits and alternatives. Specific risks include bleeding, infection, arterial injury, need for further surgery or procedure, kidney injury, contrast reaction, non targeted embolization, neurologic injury/ deficit, cardiopulmonary collapse, death The patient understands, agrees and consents for the procedure. All questions were addressed. A time out was performed. Patient is position in the supine position on the fluoroscopy table. The right inguinal region was prepped and draped in the usual sterile fashion. Maximum sterile protection was used including gown and gloves mask. 1% lidocaine was used for local anesthesia. Ultrasound survey of the right inguinal region was performed with images stored and sent to PACs. A micropuncture needle was used access the right common femoral artery under ultrasound. With excellent arterial blood flow returned, and an .018 micro wire was passed through the needle, observed enter the abdominal aorta under fluoroscopy. The needle was removed, and a micropuncture sheath was placed over the wire. The inner  dilator and wire were removed, and an 035 Bentson wire was advanced under fluoroscopy into the abdominal aorta. The sheath was removed and a standard 5 [place] sheath was placed. The dilator was removed and the sheath was flushed. Bentson wire was advanced to the aortic arch, and a pigtail catheter was passed over the wire to the aortic arch. Thoracic aortic angiogram was performed. Pigtail catheter was exchanged for a Mickelson catheter which was used to select multiple segmental vessels of the thoracic arch. Angiogram was performed of each vessel selected. Once the right bronchial artery was engaged, angiogram was performed. Micro catheter system including a high-flow Renegade catheter was passed into the right bronchial artery, at a safe distance from the origin to avoid refluxing into the aorta. Empiric embolization was then performed with 500 - 700 micro meter embospheres. One vial was used. Micro catheter system was then removed, and a final angiogram was performed through the base catheter. MAnn Held  catheter was then used to select multiple segmental vessels searching for left bronchial artery. Exchange was made over the Bentson wire for a shunt catheter. The Chung catheter was successful with engaging the origin of left bronchial artery. Angiogram performed. Micro catheter system was then advanced into the left bronchial artery for empiric embolization. Five hundred - 700 micro meter embospheres were used. One vial used. Repeat angiogram was performed. Angiogram of the right common femoral artery was performed. Exoseal was deployed. Patient tolerated the procedure well and remained hemodynamically stable throughout. No complications were encountered and no significant blood loss encountered. FINDINGS: Thoracic aorta demonstrates no dissection flap or aneurysm. No significant atherosclerotic disease along the lumen of the aorta. Multiple segmental vessels identified including 2 right bronchial artery  and single left bronchial artery from the aortic angiogram. Superior right bronchial artery angiogram: Tortuous vasculature without stenosis at the origin. Vessel is of large caliber and supplies a cluster of abnormal vessels at the apex of the lung. Inflammatory tissue at the lung apex a demonstrates dense enhancement with no early draining vein identified. No evidence of arterial contribution towards the spinal canal. Status post microsphere embolization of the right superior bronchial artery there is no significant residual blood flow. Angiogram right supreme intercostal artery angiogram: No contribution to abnormal lung tissue. Angiogram right superior intercostal angiogram: No contribution to the lung apex. Left T9 segmental vessel angiogram: No contribution to the abnormal lung tissue. No reticular medullary vessel identified. Right inferior bronchial artery angiogram: No contribution to the abnormal vasculature at the right apex. Left T8 segmental vessel angiogram: No contribution to abnormal left-sided lung tissue. Left bronchial artery angiogram: Circuitous vasculature with significant contribution to abnormal lung tissue at the left apex. There is dense opacification of inflammatory tissue at the left apex. No extravasation is identified. No contribution of vasculature directed towards the spinal canal. Status post empiric embolization with 500-700 embospheres, no significant contribution of the left bronchial artery to the left apex. IMPRESSION: Status post thoracic aortic and segmental vessel angiogram with empiric embolization of left bronchial artery and superior right bronchial artery, each contributing significant blood flow to abnormal enhancing inflammatory tissue at the left and right apex, respectively. Both artery were embolized to stasis, with no significant opacification of abnormal tissue at the completion. Status post Exoseal deployment. Signed, Dulcy Fanny. Earleen Newport, DO [facility] and  Inter[facility] Radiology Specialists [facility] Radiology Electronically Signed   By: Corrie Mckusick D.O.   On: 10/13/2015 19:21   Ir Angiogram Selective Each Additional Vessel  10/13/2015  CLINICAL DATA:  40 year old male with a history of chronic pulmonary infection with MAC, as well as prior resection for Aspergilloma. He has developed hemoptysis, with significant bleeding on the prior night. Bronchoscopy could not be performed because of concerns for the patient's pulmonary status. Bilateral empiric embolization is planned. EXAM: ULTRASOUND GUIDED ACCESS RIGHT COMMON FEMORAL ARTERY THORACIC AORTIC ANGIOGRAM SELECTIVE ANGIOGRAM OF MULTIPLE BILATERAL THORACIC SEGMENTAL VESSELS INCLUDING BILATERAL BRONCHIAL ARTERIES. 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 Date:  11/24/201611/24/2016 3:52 pm FLUOROSCOPY TIME:  22 minutes, 24 seconds MEDICATIONS AND MEDICAL HISTORY: 1.0 mg Versed, 50 mcg fentanyl ANESTHESIA/SEDATION: 73 minutes CONTRAST:  249m OMNIPAQUE IOHEXOL 300 MG/ML  SOLN COMPLICATIONS: None PROCEDURE: Informed consent was obtained from the patient following explanation of the procedure, risks, benefits and alternatives. Specific risks include bleeding, infection, arterial injury, need for further surgery or procedure, kidney injury, contrast  reaction, non targeted embolization, neurologic injury/ deficit, cardiopulmonary collapse, death The patient understands, agrees and consents for the procedure. All questions were addressed. A time out was performed. Patient is position in the supine position on the fluoroscopy table. The right inguinal region was prepped and draped in the usual sterile fashion. Maximum sterile protection was used including gown and gloves mask. 1% lidocaine was used for local anesthesia. Ultrasound survey of the right inguinal region was performed with  images stored and sent to PACs. A micropuncture needle was used access the right common femoral artery under ultrasound. With excellent arterial blood flow returned, and an .018 micro wire was passed through the needle, observed enter the abdominal aorta under fluoroscopy. The needle was removed, and a micropuncture sheath was placed over the wire. The inner dilator and wire were removed, and an 035 Bentson wire was advanced under fluoroscopy into the abdominal aorta. The sheath was removed and a standard 5 [place] vascular sheath was placed. The dilator was removed and the sheath was flushed. Bentson wire was advanced to the aortic arch, and a pigtail catheter was passed over the wire to the aortic arch. Thoracic aortic angiogram was performed. Pigtail catheter was exchanged for a Mickelson catheter which was used to select multiple segmental vessels of the thoracic arch. Angiogram was performed of each vessel selected. Once the right bronchial artery was engaged, angiogram was performed. Micro catheter system including a high-flow Renegade catheter was passed into the right bronchial artery, at a safe distance from the origin to avoid refluxing into the aorta. Empiric embolization was then performed with 500 - 700 micro meter embospheres. One vial was used. Micro catheter system was then removed, and a final angiogram was performed through the base catheter. Mickelson catheter was then used to select multiple segmental vessels searching for left bronchial artery. Exchange was made over the Bentson wire for a shunt catheter. The Chung catheter was successful with engaging the origin of left bronchial artery. Angiogram performed. Micro catheter system was then advanced into the left bronchial artery for empiric embolization. Five hundred - 700 micro meter embospheres were used. One vial used. Repeat angiogram was performed. Angiogram of the right common femoral artery was performed. Exoseal was deployed. Patient  tolerated the procedure well and remained hemodynamically stable throughout. No complications were encountered and no significant blood loss encountered. FINDINGS: Thoracic aorta demonstrates no dissection flap or aneurysm. No significant atherosclerotic disease along the lumen of the aorta. Multiple segmental vessels identified including 2 right bronchial artery and single left bronchial artery from the aortic angiogram. Superior right bronchial artery angiogram: Tortuous vasculature without stenosis at the origin. Vessel is of large caliber and supplies a cluster of abnormal vessels at the apex of the lung. Inflammatory tissue at the lung apex a demonstrates dense enhancement with no early draining vein identified. No evidence of arterial contribution towards the spinal canal. Status post microsphere embolization of the right superior bronchial artery there is no significant residual blood flow. Angiogram right supreme intercostal artery angiogram: No contribution to abnormal lung tissue. Angiogram right superior intercostal angiogram: No contribution to the lung apex. Left T9 segmental vessel angiogram: No contribution to the abnormal lung tissue. No reticular medullary vessel identified. Right inferior bronchial artery angiogram: No contribution to the abnormal vasculature at the right apex. Left T8 segmental vessel angiogram: No contribution to abnormal left-sided lung tissue. Left bronchial artery angiogram: Circuitous vasculature with significant contribution to abnormal lung tissue at the left apex. There is dense  opacification of inflammatory tissue at the left apex. No extravasation is identified. No contribution of vasculature directed towards the spinal canal. Status post empiric embolization with 500-700 embospheres, no significant contribution of the left bronchial artery to the left apex. IMPRESSION: Status post thoracic aortic and segmental vessel angiogram with empiric embolization of left bronchial  artery and superior right bronchial artery, each contributing significant blood flow to abnormal enhancing inflammatory tissue at the left and right apex, respectively. Both artery were embolized to stasis, with no significant opacification of abnormal tissue at the completion. Status post Exoseal deployment. Signed, Dulcy Fanny. Earleen Newport, DO [facility] Radiology Electronically Signed   By: Corrie Mckusick D.O.   On: 10/13/2015 19:21   Ir Angiogram Selective Each Additional Vessel  10/13/2015  CLINICAL DATA:  39 year old male with a history of chronic pulmonary infection with MAC, as well as prior resection for Aspergilloma. He has developed hemoptysis, with significant bleeding on the prior night. Bronchoscopy could not be performed because of concerns for the patient's pulmonary status. Bilateral empiric embolization is planned. EXAM: ULTRASOUND GUIDED ACCESS RIGHT COMMON FEMORAL ARTERY THORACIC AORTIC ANGIOGRAM SELECTIVE ANGIOGRAM OF MULTIPLE BILATERAL THORACIC SEGMENTAL VESSELS INCLUDING BILATERAL BRONCHIAL ARTERIES. 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 Date:  11/24/201611/24/2016 3:52 pm FLUOROSCOPY TIME:  22 minutes, 24 seconds MEDICATIONS AND MEDICAL HISTORY: 1.0 mg Versed, 50 mcg fentanyl ANESTHESIA/SEDATION: 73 minutes CONTRAST:  231m OMNIPAQUE IOHEXOL 300 MG/ML  SOLN COMPLICATIONS: None PROCEDURE: Informed consent was obtained from the patient following explanation of the procedure, risks, benefits and alternatives. Specific risks include bleeding, infection, arterial injury, need for further surgery or procedure, kidney injury, contrast reaction, non targeted embolization, neurologic injury/ deficit, cardiopulmonary collapse, death The patient understands, agrees and consents for the procedure. All questions were addressed. A  time out was performed. Patient is position in the supine position on the fluoroscopy table. The right inguinal region was prepped and draped in the usual sterile fashion. Maximum sterile protection was used including gown and gloves mask. 1% lidocaine was used for local anesthesia. Ultrasound survey of the right inguinal region was performed with images stored and sent to PACs. A micropuncture needle was used access the right common femoral artery under ultrasound. With excellent arterial blood flow returned, and an .018 micro wire was passed through the needle, observed enter the abdominal aorta under fluoroscopy. The needle was removed, and a micropuncture sheath was placed over the wire. The inner dilator and wire were removed, and an 035 Bentson wire was advanced under fluoroscopy into the abdominal aorta. The sheath was removed and a standard 5 [place] sheath was placed. The dilator was removed and the sheath was flushed. Bentson wire was advanced to the aortic arch, and a pigtail catheter was passed over the wire to the aortic arch. Thoracic aortic angiogram was performed. Pigtail catheter was exchanged for a Mickelson catheter which was used to select multiple segmental vessels of the thoracic arch. Angiogram was performed of each vessel selected. Once the right bronchial artery was engaged, angiogram was performed. Micro catheter system including a high-flow Renegade catheter was passed into the right bronchial artery, at a safe distance from the origin to avoid refluxing into the aorta. Empiric embolization was then performed with 500 - 700 micro meter embospheres. One vial was used. Micro catheter system was then removed, and a final angiogram was performed through the base  catheter. Mickelson catheter was then used to select multiple segmental vessels searching for left bronchial artery. Exchange was made over the Bentson wire for a shunt catheter. The Chung catheter was successful with engaging  the origin of left bronchial artery. Angiogram performed. Micro catheter system was then advanced into the left bronchial artery for empiric embolization. Five hundred - 700 micro meter embospheres were used. One vial used. Repeat angiogram was performed. Angiogram of the right common femoral artery was performed. Exoseal was deployed. Patient tolerated the procedure well and remained hemodynamically stable throughout. No complications were encountered and no significant blood loss encountered. FINDINGS: Thoracic aorta demonstrates no dissection flap or aneurysm. No significant atherosclerotic disease along the lumen of the aorta. Multiple segmental vessels identified including 2 right bronchial artery and single left bronchial artery from the aortic angiogram. Superior right bronchial artery angiogram: Tortuous vasculature without stenosis at the origin. Vessel is of large caliber and supplies a cluster of abnormal vessels at the apex of the lung. Inflammatory tissue at the lung apex a demonstrates dense enhancement with no early draining vein identified. No evidence of arterial contribution towards the spinal canal. Status post microsphere embolization of the right superior bronchial artery there is no significant residual blood flow. Angiogram right supreme intercostal artery angiogram: No contribution to abnormal lung tissue. Angiogram right superior intercostal angiogram: No contribution to the lung apex. Left T9 segmental vessel angiogram: No contribution to the abnormal lung tissue. No reticular medullary vessel identified. Right inferior bronchial artery angiogram: No contribution to the abnormal vasculature at the right apex. Left T8 segmental vessel angiogram: No contribution to abnormal left-sided lung tissue. Left bronchial artery angiogram: Circuitous vasculature with significant contribution to abnormal lung tissue at the left apex. There is dense opacification of inflammatory tissue at the left apex.  No extravasation is identified. No contribution of vasculature directed towards the spinal canal. Status post empiric embolization with 500-700 embospheres, no significant contribution of the left bronchial artery to the left apex. IMPRESSION: Status post thoracic aortic and segmental vessel angiogram with empiric embolization of left bronchial artery and superior right bronchial artery, each contributing significant blood flow to abnormal enhancing inflammatory tissue at the left and right apex, respectively. Both artery were embolized to stasis, with no significant opacification of abnormal tissue at the completion. Status post Exoseal deployment. Signed, Dulcy Fanny. Earleen Newport, DO [facility] Radiology Electronically Signed   By: Corrie Mckusick D.O.   On: 10/13/2015 19:21   Ir Angiogram Selective Each Additional Vessel  10/13/2015  CLINICAL DATA:  39 year old male with a history of chronic pulmonary infection with MAC, as well as prior resection for Aspergilloma. He has developed hemoptysis, with significant bleeding on the prior night. Bronchoscopy could not be performed because of concerns for the patient's pulmonary status. Bilateral empiric embolization is planned. EXAM: ULTRASOUND GUIDED ACCESS RIGHT COMMON FEMORAL ARTERY THORACIC AORTIC ANGIOGRAM SELECTIVE ANGIOGRAM OF MULTIPLE BILATERAL THORACIC SEGMENTAL VESSELS INCLUDING BILATERAL BRONCHIAL ARTERIES. 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 Date:  11/24/201611/24/2016 3:52 pm FLUOROSCOPY TIME:  22 minutes, 24 seconds MEDICATIONS AND MEDICAL HISTORY: 1.0 mg Versed, 50 mcg fentanyl ANESTHESIA/SEDATION: 73 minutes CONTRAST:  239m OMNIPAQUE IOHEXOL 300 MG/ML  SOLN COMPLICATIONS: None PROCEDURE: Informed consent was obtained from the patient following explanation of the procedure,  risks, benefits and alternatives. Specific risks include bleeding, infection, arterial injury, need for further surgery or procedure,  kidney injury, contrast reaction, non targeted embolization, neurologic injury/ deficit, cardiopulmonary collapse, death The patient understands, agrees and consents for the procedure. All questions were addressed. A time out was performed. Patient is position in the supine position on the fluoroscopy table. The right inguinal region was prepped and draped in the usual sterile fashion. Maximum sterile protection was used including gown and gloves mask. 1% lidocaine was used for local anesthesia. Ultrasound survey of the right inguinal region was performed with images stored and sent to PACs. A micropuncture needle was used access the right common femoral artery under ultrasound. With excellent arterial blood flow returned, and an .018 micro wire was passed through the needle, observed enter the abdominal aorta under fluoroscopy. The needle was removed, and a micropuncture sheath was placed over the wire. The inner dilator and wire were removed, and an 035 Bentson wire was advanced under fluoroscopy into the abdominal aorta. The sheath was removed and a standard 5 [place] vascular sheath was placed. The dilator was removed and the sheath was flushed. Bentson wire was advanced to the aortic arch, and a pigtail catheter was passed over the wire to the aortic arch. Thoracic aortic angiogram was performed. Pigtail catheter was exchanged for a Mickelson catheter which was used to select multiple segmental vessels of the thoracic arch. Angiogram was performed of each vessel selected. Once the right bronchial artery was engaged, angiogram was performed. Micro catheter system including a high-flow Renegade catheter was passed into the right bronchial artery, at a safe distance from the origin to avoid refluxing into the aorta. Empiric embolization was then performed with 500 - 700 micro meter  embospheres. One vial was used. Micro catheter system was then removed, and a final angiogram was performed through the base catheter. Mickelson catheter was then used to select multiple segmental vessels searching for left bronchial artery. Exchange was made over the Bentson wire for a shunt catheter. The Chung catheter was successful with engaging the origin of left bronchial artery. Angiogram performed. Micro catheter system was then advanced into the left bronchial artery for empiric embolization. Five hundred - 700 micro meter embospheres were used. One vial used. Repeat angiogram was performed. Angiogram of the right common femoral artery was performed. Exoseal was deployed. Patient tolerated the procedure well and remained hemodynamically stable throughout. No complications were encountered and no significant blood loss encountered. FINDINGS: Thoracic aorta demonstrates no dissection flap or aneurysm. No significant atherosclerotic disease along the lumen of the aorta. Multiple segmental vessels identified including 2 right bronchial artery and single left bronchial artery from the aortic angiogram. Superior right bronchial artery angiogram: Tortuous vasculature without stenosis at the origin. Vessel is of large caliber and supplies a cluster of abnormal vessels at the apex of the lung. Inflammatory tissue at the lung apex a demonstrates dense enhancement with no early draining vein identified. No evidence of arterial contribution towards the spinal canal. Status post microsphere embolization of the right superior bronchial artery there is no significant residual blood flow. Angiogram right supreme intercostal artery angiogram: No contribution to abnormal lung tissue. Angiogram right superior intercostal angiogram: No contribution to the lung apex. Left T9 segmental vessel angiogram: No contribution to the abnormal lung tissue. No reticular medullary vessel identified. Right inferior bronchial artery  angiogram: No contribution to the abnormal vasculature at the right apex. Left T8 segmental vessel angiogram: No contribution to abnormal left-sided lung tissue. Left bronchial artery angiogram: Circuitous vasculature with significant contribution to abnormal lung tissue at the left apex.  There is dense opacification of inflammatory tissue at the left apex. No extravasation is identified. No contribution of vasculature directed towards the spinal canal. Status post empiric embolization with 500-700 embospheres, no significant contribution of the left bronchial artery to the left apex. IMPRESSION: Status post thoracic aortic and segmental vessel angiogram with empiric embolization of left bronchial artery and superior right bronchial artery, each contributing significant blood flow to abnormal enhancing inflammatory tissue at the left and right apex, respectively. Both artery were embolized to stasis, with no significant opacification of abnormal tissue at the completion. Status post Exoseal deployment. Signed, Dulcy Fanny. Earleen Newport, DO [facility] Radiology Electronically Signed   By: Corrie Mckusick D.O.   On: 10/13/2015 19:21   Ir Angiogram Follow Up Study  10/13/2015  CLINICAL DATA:  39 year old male with a history of chronic pulmonary infection with MAC, as well as prior resection for Aspergilloma. He has developed hemoptysis, with significant bleeding on the prior night. Bronchoscopy could not be performed because of concerns for the patient's pulmonary status. Bilateral empiric embolization is planned. EXAM: ULTRASOUND GUIDED ACCESS RIGHT COMMON FEMORAL ARTERY THORACIC AORTIC ANGIOGRAM SELECTIVE ANGIOGRAM OF MULTIPLE BILATERAL THORACIC SEGMENTAL VESSELS INCLUDING BILATERAL BRONCHIAL ARTERIES. 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  Date:  11/24/201611/24/2016 3:52 pm FLUOROSCOPY TIME:  22 minutes, 24 seconds MEDICATIONS AND MEDICAL HISTORY: 1.0 mg Versed, 50 mcg fentanyl ANESTHESIA/SEDATION: 73 minutes CONTRAST:  267m OMNIPAQUE IOHEXOL 300 MG/ML  SOLN COMPLICATIONS: None PROCEDURE: Informed consent was obtained from the patient following explanation of the procedure, risks, benefits and alternatives. Specific risks include bleeding, infection, arterial injury, need for further surgery or procedure, kidney injury, contrast reaction, non targeted embolization, neurologic injury/ deficit, cardiopulmonary collapse, death The patient understands, agrees and consents for the procedure. All questions were addressed. A time out was performed. Patient is position in the supine position on the fluoroscopy table. The right inguinal region was prepped and draped in the usual sterile fashion. Maximum sterile protection was used including gown and gloves mask. 1% lidocaine was used for local anesthesia. Ultrasound survey of the right inguinal region was performed with images stored and sent to PACs. A micropuncture needle was used access the right common femoral artery under ultrasound. With excellent arterial blood flow returned, and an .018 micro wire was passed through the needle, observed enter the abdominal aorta under fluoroscopy. The needle was removed, and a micropuncture sheath was placed over the wire. The inner dilator and wire were removed, and an 035 Bentson wire was advanced under fluoroscopy into the abdominal aorta. The sheath was removed and a standard 5 [place] sheath was placed. The dilator was removed and the sheath was flushed. Bentson wire was advanced to the aortic arch, and a pigtail catheter was passed over the wire to the aortic arch. Thoracic aortic angiogram was performed. Pigtail catheter was exchanged for a Mickelson catheter which was used to select multiple segmental vessels of the thoracic arch. Angiogram was  performed of each vessel selected. Once the right bronchial artery was engaged, angiogram was performed. Micro catheter system including a high-flow Renegade catheter was passed into the right bronchial artery, at a safe distance from the origin to avoid refluxing into the aorta. Empiric embolization was then performed with 500 - 700 micro meter embospheres. One vial was used. Micro catheter system was then removed, and a final angiogram was performed through  the base catheter. Mickelson catheter was then used to select multiple segmental vessels searching for left bronchial artery. Exchange was made over the Bentson wire for a shunt catheter. The Chung catheter was successful with engaging the origin of left bronchial artery. Angiogram performed. Micro catheter system was then advanced into the left bronchial artery for empiric embolization. Five hundred - 700 micro meter embospheres were used. One vial used. Repeat angiogram was performed. Angiogram of the right common femoral artery was performed. Exoseal was deployed. Patient tolerated the procedure well and remained hemodynamically stable throughout. No complications were encountered and no significant blood loss encountered. FINDINGS: Thoracic aorta demonstrates no dissection flap or aneurysm. No significant atherosclerotic disease along the lumen of the aorta. Multiple segmental vessels identified including 2 right bronchial artery and single left bronchial artery from the aortic angiogram. Superior right bronchial artery angiogram: Tortuous vasculature without stenosis at the origin. Vessel is of large caliber and supplies a cluster of abnormal vessels at the apex of the lung. Inflammatory tissue at the lung apex a demonstrates dense enhancement with no early draining vein identified. No evidence of arterial contribution towards the spinal canal. Status post microsphere embolization of the right superior bronchial artery there is no significant residual  blood flow. Angiogram right supreme intercostal artery angiogram: No contribution to abnormal lung tissue. Angiogram right superior intercostal angiogram: No contribution to the lung apex. Left T9 segmental vessel angiogram: No contribution to the abnormal lung tissue. No reticular medullary vessel identified. Right inferior bronchial artery angiogram: No contribution to the abnormal vasculature at the right apex. Left T8 segmental vessel angiogram: No contribution to abnormal left-sided lung tissue. Left bronchial artery angiogram: Circuitous vasculature with significant contribution to abnormal lung tissue at the left apex. There is dense opacification of inflammatory tissue at the left apex. No extravasation is identified. No contribution of vasculature directed towards the spinal canal. Status post empiric embolization with 500-700 embospheres, no significant contribution of the left bronchial artery to the left apex. IMPRESSION: Status post thoracic aortic and segmental vessel angiogram with empiric embolization of left bronchial artery and superior right bronchial artery, each contributing significant blood flow to abnormal enhancing inflammatory tissue at the left and right apex, respectively. Both artery were embolized to stasis, with no significant opacification of abnormal tissue at the completion. Status post Exoseal deployment. Signed, Dulcy Fanny. Earleen Newport, DO [facility] Radiology Electronically Signed   By: Corrie Mckusick D.O.   On: 10/13/2015 19:21   Ir Angiogram Follow Up Study  10/13/2015  CLINICAL DATA:  39 year old male with a history of chronic pulmonary infection with MAC, as well as prior resection for Aspergilloma. He has developed hemoptysis, with significant bleeding on the prior night. Bronchoscopy could not be performed because of concerns for the patient's pulmonary status. Bilateral empiric embolization is planned. EXAM: ULTRASOUND GUIDED  ACCESS RIGHT COMMON FEMORAL ARTERY THORACIC AORTIC ANGIOGRAM SELECTIVE ANGIOGRAM OF MULTIPLE BILATERAL THORACIC SEGMENTAL VESSELS INCLUDING BILATERAL BRONCHIAL ARTERIES. 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 Date:  11/24/201611/24/2016 3:52 pm FLUOROSCOPY TIME:  22 minutes, 24 seconds MEDICATIONS AND MEDICAL HISTORY: 1.0 mg Versed, 50 mcg fentanyl ANESTHESIA/SEDATION: 73 minutes CONTRAST:  223m OMNIPAQUE IOHEXOL 300 MG/ML  SOLN COMPLICATIONS: None PROCEDURE: Informed consent was obtained from the patient following explanation of the procedure, risks, benefits and alternatives. Specific risks include bleeding, infection, arterial injury, need for further surgery or procedure,  kidney injury, contrast reaction, non targeted embolization, neurologic injury/ deficit, cardiopulmonary collapse, death The patient understands, agrees and consents for the procedure. All questions were addressed. A time out was performed. Patient is position in the supine position on the fluoroscopy table. The right inguinal region was prepped and draped in the usual sterile fashion. Maximum sterile protection was used including gown and gloves mask. 1% lidocaine was used for local anesthesia. Ultrasound survey of the right inguinal region was performed with images stored and sent to PACs. A micropuncture needle was used access the right common femoral artery under ultrasound. With excellent arterial blood flow returned, and an .018 micro wire was passed through the needle, observed enter the abdominal aorta under fluoroscopy. The needle was removed, and a micropuncture sheath was placed over the wire. The inner dilator and wire were removed, and an 035 Bentson wire was advanced under fluoroscopy into the abdominal aorta. The sheath was removed and a standard 5 [place] vascular sheath was placed. The dilator was  removed and the sheath was flushed. Bentson wire was advanced to the aortic arch, and a pigtail catheter was passed over the wire to the aortic arch. Thoracic aortic angiogram was performed. Pigtail catheter was exchanged for a Mickelson catheter which was used to select multiple segmental vessels of the thoracic arch. Angiogram was performed of each vessel selected. Once the right bronchial artery was engaged, angiogram was performed. Micro catheter system including a high-flow Renegade catheter was passed into the right bronchial artery, at a safe distance from the origin to avoid refluxing into the aorta. Empiric embolization was then performed with 500 - 700 micro meter embospheres. One vial was used. Micro catheter system was then removed, and a final angiogram was performed through the base catheter. Mickelson catheter was then used to select multiple segmental vessels searching for left bronchial artery. Exchange was made over the Bentson wire for a shunt catheter. The Chung catheter was successful with engaging the origin of left bronchial artery. Angiogram performed. Micro catheter system was then advanced into the left bronchial artery for empiric embolization. Five hundred - 700 micro meter embospheres were used. One vial used. Repeat angiogram was performed. Angiogram of the right common femoral artery was performed. Exoseal was deployed. Patient tolerated the procedure well and remained hemodynamically stable throughout. No complications were encountered and no significant blood loss encountered. FINDINGS: Thoracic aorta demonstrates no dissection flap or aneurysm. No significant atherosclerotic disease along the lumen of the aorta. Multiple segmental vessels identified including 2 right bronchial artery and single left bronchial artery from the aortic angiogram. Superior right bronchial artery angiogram: Tortuous vasculature without stenosis at the origin. Vessel is of large caliber and supplies a  cluster of abnormal vessels at the apex of the lung. Inflammatory tissue at the lung apex a demonstrates dense enhancement with no early draining vein identified. No evidence of arterial contribution towards the spinal canal. Status post microsphere embolization of the right superior bronchial artery there is no significant residual blood flow. Angiogram right supreme intercostal artery angiogram: No contribution to abnormal lung tissue. Angiogram right superior intercostal angiogram: No contribution to the lung apex. Left T9 segmental vessel angiogram: No contribution to the abnormal lung tissue. No reticular medullary vessel identified. Right inferior bronchial artery angiogram: No contribution to the abnormal vasculature at the right apex. Left T8 segmental vessel angiogram: No contribution to abnormal left-sided lung tissue. Left bronchial artery angiogram: Circuitous vasculature with significant contribution to abnormal lung tissue at the left apex.  There is dense opacification of inflammatory tissue at the left apex. No extravasation is identified. No contribution of vasculature directed towards the spinal canal. Status post empiric embolization with 500-700 embospheres, no significant contribution of the left bronchial artery to the left apex. IMPRESSION: Status post thoracic aortic and segmental vessel angiogram with empiric embolization of left bronchial artery and superior right bronchial artery, each contributing significant blood flow to abnormal enhancing inflammatory tissue at the left and right apex, respectively. Both artery were embolized to stasis, with no significant opacification of abnormal tissue at the completion. Status post Exoseal deployment. Signed, Dulcy Fanny. Earleen Newport, DO [facility] Radiology Electronically Signed   By: Corrie Mckusick D.O.   On: 10/13/2015 19:21   Ir US Guide Vasc Access Right  10/13/2015  CLINICAL DATA:  39 year old male  with a history of chronic pulmonary infection with MAC, as well as prior resection for Aspergilloma. He has developed hemoptysis, with significant bleeding on the prior night. Bronchoscopy could not be performed because of concerns for the patient's pulmonary status. Bilateral empiric embolization is planned. EXAM: ULTRASOUND GUIDED ACCESS RIGHT COMMON FEMORAL ARTERY THORACIC AORTIC ANGIOGRAM SELECTIVE ANGIOGRAM OF MULTIPLE BILATERAL THORACIC SEGMENTAL VESSELS INCLUDING BILATERAL BRONCHIAL ARTERIES. 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 Date:  11/24/201611/24/2016 3:52 pm FLUOROSCOPY TIME:  22 minutes, 24 seconds MEDICATIONS AND MEDICAL HISTORY: 1.0 mg Versed, 50 mcg fentanyl ANESTHESIA/SEDATION: 73 minutes CONTRAST:  211m OMNIPAQUE IOHEXOL 300 MG/ML  SOLN COMPLICATIONS: None PROCEDURE: Informed consent was obtained from the patient following explanation of the procedure, risks, benefits and alternatives. Specific risks include bleeding, infection, arterial injury, need for further surgery or procedure, kidney injury, contrast reaction, non targeted embolization, neurologic injury/ deficit, cardiopulmonary collapse, death The patient understands, agrees and consents for the procedure. All questions were addressed. A time out was performed. Patient is position in the supine position on the fluoroscopy table. The right inguinal region was prepped and draped in the usual sterile fashion. Maximum sterile protection was used including gown and gloves mask. 1% lidocaine was used for local anesthesia. Ultrasound survey of the right inguinal region was performed with images stored and sent to PACs. A micropuncture needle was used access the right common femoral artery under ultrasound. With excellent arterial blood flow returned, and an .018 micro wire was passed through the needle, observed  enter the abdominal aorta under fluoroscopy. The needle was removed, and a micropuncture sheath was placed over the wire. The inner dilator and wire were removed, and an 035 Bentson wire was advanced under fluoroscopy into the abdominal aorta. The sheath was removed and a standard 5 [place] sheath was placed. The dilator was removed and the sheath was flushed. Bentson wire was advanced to the aortic arch, and a pigtail catheter was passed over the wire to the aortic arch. Thoracic aortic angiogram was performed. Pigtail catheter was exchanged for a Mickelson catheter which was used to select multiple segmental vessels of the thoracic arch. Angiogram was performed of each vessel selected. Once the right bronchial artery was engaged, angiogram was performed. Micro catheter system including a high-flow Renegade catheter was passed into the right bronchial artery, at a safe distance from the origin to avoid refluxing into the aorta. Empiric embolization was then performed with 500 - 700 micro meter embospheres. One vial was used. Micro catheter system was then removed, and a final angiogram was performed  through the base catheter. Mickelson catheter was then used to select multiple segmental vessels searching for left bronchial artery. Exchange was made over the Bentson wire for a shunt catheter. The Chung catheter was successful with engaging the origin of left bronchial artery. Angiogram performed. Micro catheter system was then advanced into the left bronchial artery for empiric embolization. Five hundred - 700 micro meter embospheres were used. One vial used. Repeat angiogram was performed. Angiogram of the right common femoral artery was performed. Exoseal was deployed. Patient tolerated the procedure well and remained hemodynamically stable throughout. No complications were encountered and no significant blood loss encountered. FINDINGS: Thoracic aorta demonstrates no dissection flap or aneurysm. No  significant atherosclerotic disease along the lumen of the aorta. Multiple segmental vessels identified including 2 right bronchial artery and single left bronchial artery from the aortic angiogram. Superior right bronchial artery angiogram: Tortuous vasculature without stenosis at the origin. Vessel is of large caliber and supplies a cluster of abnormal vessels at the apex of the lung. Inflammatory tissue at the lung apex a demonstrates dense enhancement with no early draining vein identified. No evidence of arterial contribution towards the spinal canal. Status post microsphere embolization of the right superior bronchial artery there is no significant residual blood flow. Angiogram right supreme intercostal artery angiogram: No contribution to abnormal lung tissue. Angiogram right superior intercostal angiogram: No contribution to the lung apex. Left T9 segmental vessel angiogram: No contribution to the abnormal lung tissue. No reticular medullary vessel identified. Right inferior bronchial artery angiogram: No contribution to the abnormal vasculature at the right apex. Left T8 segmental vessel angiogram: No contribution to abnormal left-sided lung tissue. Left bronchial artery angiogram: Circuitous vasculature with significant contribution to abnormal lung tissue at the left apex. There is dense opacification of inflammatory tissue at the left apex. No extravasation is identified. No contribution of vasculature directed towards the spinal canal. Status post empiric embolization with 500-700 embospheres, no significant contribution of the left bronchial artery to the left apex. IMPRESSION: Status post thoracic aortic and segmental vessel angiogram with empiric embolization of left bronchial artery and superior right bronchial artery, each contributing significant blood flow to abnormal enhancing inflammatory tissue at the left and right apex, respectively. Both artery were embolized to stasis, with no significant  opacification of abnormal tissue at the completion. Status post Exoseal deployment. Signed, Dulcy Fanny. Earleen Newport, DO [facility] Radiology Electronically Signed   By: Corrie Mckusick D.O.   On: 10/13/2015 19:21   Dg Chest Port 1 View  10/14/2015  CLINICAL DATA:  Hemoptysis, mycobacterium avium intracellular complex. EXAM: PORTABLE CHEST 1 VIEW COMPARISON:  October 13, 2015. FINDINGS: Stable cardiomediastinal silhouette. Bilateral upper lobe bulla are again noted and unchanged. Associated scarring in the both upper lobes is noted. Opacity is again noted in the left upper lobe most likely representing fungus ball. No significant pleural effusion is noted. Bony thorax is unremarkable. IMPRESSION: Stable bilateral bulla formation and scarring is noted in both upper lobes. Soft tissue density is again noted in left upper lobe most likely representing fungus ball as described on prior CT scan. No significant changes noted compared to prior exam. Electronically Signed   By: Marijo Conception, M.D.   On: 10/14/2015 07:41   Dg Chest Portable 1 View  10/13/2015  CLINICAL DATA:  Acute onset of hemoptysis and shortness of breath. Initial encounter. EXAM: PORTABLE CHEST 1 VIEW COMPARISON:  Chest radiograph performed 09/28/2014 FINDINGS: There appears to be an  enlarging 3.9 cm mass near the left lung apex. Biapical pleural parenchymal scarring and bullous change are again seen, with somewhat worsening bilateral pulmonary nodularity. Pleural thickening near the left lung mass appears worsened, raising concern for local spread of disease. No pleural effusion or pneumothorax is seen. The cardiomediastinal silhouette is normal in size. No acute osseous abnormalities are identified. A chronic left fourth rib deformity is noted. IMPRESSION: 1. Apparent enlarging 3.9 cm mass near the left lung apex, concerning for malignancy. Pleural thickening near the left lung mass appears worsened,  raising concern for local spread of disease. CT of the chest would be helpful for further evaluation, when and as deemed clinically appropriate. 2. Somewhat worsening bilateral pulmonary nodularity noted. Biapical pleural parenchymal scarring and bullous change again noted. Electronically Signed   By: Garald Balding M.D.   On: 10/13/2015 05:22   [place] Guide Roadmapping  10/13/2015  CLINICAL DATA:  39 year old male with a history of chronic pulmonary infection with MAC, as well as prior resection for Aspergilloma. He has developed hemoptysis, with significant bleeding on the prior night. Bronchoscopy could not be performed because of concerns for the patient's pulmonary status. Bilateral empiric embolization is planned. EXAM: ULTRASOUND GUIDED ACCESS RIGHT COMMON FEMORAL ARTERY THORACIC AORTIC ANGIOGRAM SELECTIVE ANGIOGRAM OF MULTIPLE BILATERAL THORACIC SEGMENTAL VESSELS INCLUDING BILATERAL BRONCHIAL ARTERIES. 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 Date:  11/24/201611/24/2016 3:52 pm FLUOROSCOPY TIME:  22 minutes, 24 seconds MEDICATIONS AND MEDICAL HISTORY: 1.0 mg Versed, 50 mcg fentanyl ANESTHESIA/SEDATION: 73 minutes CONTRAST:  211m OMNIPAQUE IOHEXOL 300 MG/ML  SOLN COMPLICATIONS: None PROCEDURE: Informed consent was obtained from the patient following explanation of the procedure, risks, benefits and alternatives. Specific risks include bleeding, infection, arterial injury, need for further surgery or procedure, kidney injury, contrast reaction, non targeted embolization, neurologic injury/ deficit, cardiopulmonary collapse, death The patient understands, agrees and consents for the procedure. All questions were addressed. A time out was performed. Patient is position in the supine position on the fluoroscopy table. The right inguinal region  was prepped and draped in the usual sterile fashion. Maximum sterile protection was used including gown and gloves mask. 1% lidocaine was used for local anesthesia. Ultrasound survey of the right inguinal region was performed with images stored and sent to PACs. A micropuncture needle was used access the right common femoral artery under ultrasound. With excellent arterial blood flow returned, and an .018 micro wire was passed through the needle, observed enter the abdominal aorta under fluoroscopy. The needle was removed, and a micropuncture sheath was placed over the wire. The inner dilator and wire were removed, and an 035 Bentson wire was advanced under fluoroscopy into the abdominal aorta. The sheath was removed and a standard 5 [place] sheath was placed. The dilator was removed and the sheath was flushed. Bentson wire was advanced to the aortic arch, and a pigtail catheter was passed over the wire to the aortic arch. Thoracic aortic angiogram was performed. Pigtail catheter was exchanged for a Mickelson catheter which was used to select multiple segmental vessels of the thoracic arch. Angiogram was performed of each vessel selected. Once the right bronchial artery was engaged, angiogram was performed. Micro catheter system including a high-flow Renegade catheter was passed into the right bronchial artery, at a safe distance from the origin to avoid refluxing into the aorta. Empiric embolization was  then performed with 500 - 700 micro meter embospheres. One vial was used. Micro catheter system was then removed, and a final angiogram was performed through the base catheter. Mickelson catheter was then used to select multiple segmental vessels searching for left bronchial artery. Exchange was made over the Bentson wire for a shunt catheter. The Chung catheter was successful with engaging the origin of left bronchial artery. Angiogram performed. Micro catheter system was then advanced into the left  bronchial artery for empiric embolization. Five hundred - 700 micro meter embospheres were used. One vial used. Repeat angiogram was performed. Angiogram of the right common femoral artery was performed. Exoseal was deployed. Patient tolerated the procedure well and remained hemodynamically stable throughout. No complications were encountered and no significant blood loss encountered. FINDINGS: Thoracic aorta demonstrates no dissection flap or aneurysm. No significant atherosclerotic disease along the lumen of the aorta. Multiple segmental vessels identified including 2 right bronchial artery and single left bronchial artery from the aortic angiogram. Superior right bronchial artery angiogram: Tortuous vasculature without stenosis at the origin. Vessel is of large caliber and supplies a cluster of abnormal vessels at the apex of the lung. Inflammatory tissue at the lung apex a demonstrates dense enhancement with no early draining vein identified. No evidence of arterial contribution towards the spinal canal. Status post microsphere embolization of the right superior bronchial artery there is no significant residual blood flow. Angiogram right supreme intercostal artery angiogram: No contribution to abnormal lung tissue. Angiogram right superior intercostal angiogram: No contribution to the lung apex. Left T9 segmental vessel angiogram: No contribution to the abnormal lung tissue. No reticular medullary vessel identified. Right inferior bronchial artery angiogram: No contribution to the abnormal vasculature at the right apex. Left T8 segmental vessel angiogram: No contribution to abnormal left-sided lung tissue. Left bronchial artery angiogram: Circuitous vasculature with significant contribution to abnormal lung tissue at the left apex. There is dense opacification of inflammatory tissue at the left apex. No extravasation is identified. No contribution of vasculature directed towards the spinal canal. Status post  empiric embolization with 500-700 embospheres, no significant contribution of the left bronchial artery to the left apex. IMPRESSION: Status post thoracic aortic and segmental vessel angiogram with empiric embolization of left bronchial artery and superior right bronchial artery, each contributing significant blood flow to abnormal enhancing inflammatory tissue at the left and right apex, respectively. Both artery were embolized to stasis, with no significant opacification of abnormal tissue at the completion. Status post Exoseal deployment. Signed, Dulcy Fanny. Earleen Newport, DO [facility] Radiology Electronically Signed   By: Corrie Mckusick D.O.   On: 10/13/2015 19:21   [place] Guide Roadmapping  10/13/2015  CLINICAL DATA:  39 year old male with a history of chronic pulmonary infection with MAC, as well as prior resection for Aspergilloma. He has developed hemoptysis, with significant bleeding on the prior night. Bronchoscopy could not be performed because of concerns for the patient's pulmonary status. Bilateral empiric embolization is planned. EXAM: ULTRASOUND GUIDED ACCESS RIGHT COMMON FEMORAL ARTERY THORACIC AORTIC ANGIOGRAM SELECTIVE ANGIOGRAM OF MULTIPLE BILATERAL THORACIC SEGMENTAL VESSELS INCLUDING BILATERAL BRONCHIAL ARTERIES. 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 Date:  11/24/201611/24/2016 3:52 pm FLUOROSCOPY TIME:  22 minutes, 24 seconds MEDICATIONS AND MEDICAL HISTORY: 1.0 mg Versed, 50 mcg fentanyl ANESTHESIA/SEDATION: 73 minutes CONTRAST:  23m OMNIPAQUE IOHEXOL 300 MG/ML  SOLN COMPLICATIONS: None PROCEDURE: Informed consent was obtained from the patient following explanation of the procedure, risks, benefits and alternatives. Specific risks include bleeding, infection, arterial injury, need  for further surgery or procedure, kidney injury, contrast reaction, non targeted embolization, neurologic injury/ deficit, cardiopulmonary collapse, death The patient understands, agrees and consents for the procedure. All questions were addressed. A time out was performed. Patient is position in the supine position on the fluoroscopy table. The right inguinal region was prepped and draped in the usual sterile fashion. Maximum sterile protection was used including gown and gloves mask. 1% lidocaine was used for local anesthesia. Ultrasound survey of the right inguinal region was performed with images stored and sent to PACs. A micropuncture needle was used access the right common femoral artery under ultrasound. With excellent arterial blood flow returned, and an .018 micro wire was passed through the needle, observed enter the abdominal aorta under fluoroscopy. The needle was removed, and a micropuncture sheath was placed over the wire. The inner dilator and wire were removed, and an 035 Bentson wire was advanced under fluoroscopy into the abdominal aorta. The sheath was removed and a standard 5 [place] vascular sheath was placed. The dilator was removed and the sheath was flushed. Bentson wire was advanced to the aortic arch, and a pigtail catheter was passed over the wire to the aortic arch. Thoracic aortic angiogram was performed. Pigtail catheter was exchanged for a Mickelson catheter which was used to select multiple segmental vessels of the thoracic arch. Angiogram was performed of each vessel selected. Once the right bronchial artery was engaged, angiogram was performed. Micro catheter system including a high-flow Renegade catheter was passed into the right bronchial artery, at a safe distance from the origin to avoid refluxing into the aorta. Empiric embolization was then performed with 500 - 700 micro meter embospheres. One vial was used. Micro catheter system was then removed, and a final angiogram was  performed through the base catheter. Mickelson catheter was then used to select multiple segmental vessels searching for left bronchial artery. Exchange was made over the Bentson wire for a shunt catheter. The Chung catheter was successful with engaging the origin of left bronchial artery. Angiogram performed. Micro catheter system was then advanced into the left bronchial artery for empiric embolization. Five hundred - 700 micro meter embospheres were used. One vial used. Repeat angiogram was performed. Angiogram of the right common femoral artery was performed. Exoseal was deployed. Patient tolerated the procedure well and remained hemodynamically stable throughout. No complications were encountered and no significant blood loss encountered. FINDINGS: Thoracic aorta demonstrates no dissection flap or aneurysm. No significant atherosclerotic disease along the lumen of the aorta. Multiple segmental vessels identified including 2 right bronchial artery and single left bronchial artery from the aortic angiogram. Superior right bronchial artery angiogram: Tortuous vasculature without stenosis at the origin. Vessel is of large caliber and supplies a cluster of abnormal vessels at the apex of the lung. Inflammatory tissue at the lung apex a demonstrates dense enhancement with no early draining vein identified. No evidence of arterial contribution towards the spinal canal. Status post microsphere embolization of the right superior bronchial artery there is no significant residual blood flow. Angiogram right supreme intercostal artery angiogram: No contribution to abnormal lung tissue. Angiogram right superior intercostal angiogram: No contribution to the lung apex. Left T9 segmental vessel angiogram: No contribution to the abnormal lung tissue. No reticular medullary vessel identified. Right inferior bronchial artery angiogram: No contribution to the abnormal vasculature  at the right apex. Left T8 segmental vessel  angiogram: No contribution to abnormal left-sided lung tissue. Left bronchial artery angiogram: Circuitous vasculature with significant contribution to abnormal lung tissue at the left apex. There is dense opacification of inflammatory tissue at the left apex. No extravasation is identified. No contribution of vasculature directed towards the spinal canal. Status post empiric embolization with 500-700 embospheres, no significant contribution of the left bronchial artery to the left apex. IMPRESSION: Status post thoracic aortic and segmental vessel angiogram with empiric embolization of left bronchial artery and superior right bronchial artery, each contributing significant blood flow to abnormal enhancing inflammatory tissue at the left and right apex, respectively. Both artery were embolized to stasis, with no significant opacification of abnormal tissue at the completion. Status post Exoseal deployment. Signed, Dulcy Fanny. Earleen Newport, DO [facility] Radiology Electronically Signed   By: Corrie Mckusick D.O.   On: 10/13/2015 19:21    Thurnell Lose, MD  [facility] Pager:[phone]  If 7PM-7AM, please contact night-coverage [email or web address] Password [email or web address] 10/18/2015, 9:31 AM   LOS: 5 days

## 2015-10-18 NOTE — Progress Notes (Signed)
[place] for Infectious Disease    Date of Admission:  10/13/2015      ID: [name] is a 39 y.o. 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   Medications:  . arformoterol  15 mcg Nebulization BID  . azithromycin  500 mg Oral Daily  . budesonide  0.5 mg Nebulization BID  . ethambutol  1,000 mg Oral Daily  . feeding supplement (ENSURE ENLIVE)  237 mL Oral BID BM  . ferrous sulfate  325 mg Oral Daily  . itraconazole  200 mg Oral BID  . levothyroxine  50 mcg Oral QAC breakfast  . multivitamin with minerals  1 tablet Oral Daily  . predniSONE  10 mg Oral Daily  . tiotropium  18 mcg Inhalation Daily    Objective: Vital signs in last 24 hours: Temp:  [98 F (36.7 C)-98.4 F (36.9 C)] 98 F (36.7 C) (11/29 0602) Pulse Rate:  [82-96] 83 (11/29 0602) Resp:  [17-18] 18 (11/29 0602) BP: (114-124)/(73) 115/73 mmHg (11/29 0602) SpO2:  [95 %-98 %] 96 % (11/29 1037) FiO2 (%):  [6 %] 6 % (11/28 2042) Physical Exam  Constitutional: He is oriented to person, place, and time. He appears well-developed and well-nourished. No distress.  HENT:  Mouth/Throat: Oropharynx is clear and moist. No oropharyngeal exudate.  Cardiovascular: Normal rate, regular rhythm and normal heart sounds. Exam reveals no gallop and no friction rub.  No murmur heard.  Pulmonary/Chest: Effort normal and breath sounds normal. No respiratory distress. He has no wheezes.  Lymphadenopathy:  He has no cervical adenopathy.  Neurological: He is alert and oriented to person, place, and time.  Skin: Skin is warm and dry. No rash noted. No erythema.  Psychiatric: He has a normal mood and affect. His behavior is normal.   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 Concepcion Living. We will check serology testing for gallactomannan. ([facility] test code [phone], gold top, refrigerated) If excessively elevated, than c/w aspergillosis infection. For now continue on itraconazole. 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

## 2015-10-19 MED ORDER — VORICONAZOLE 50 MG PO TABS
300.0000 mg | ORAL_TABLET | Freq: Two times a day (BID) | ORAL | Status: DC
  Administered 2015-10-19 – 2015-10-20 (×2): 300 mg via ORAL
  Filled 2015-10-19 (×3): qty 2

## 2015-10-19 MED ORDER — PHENYLEPHRINE HCL 0.25 % NA SOLN
1.0000 | Freq: Four times a day (QID) | NASAL | Status: DC | PRN
  Filled 2015-10-19: qty 15

## 2015-10-19 MED ORDER — LIDOCAINE HCL 2 % EX GEL
1.0000 "application " | Freq: Once | CUTANEOUS | Status: DC
  Filled 2015-10-19: qty 5

## 2015-10-19 MED ORDER — BUTAMBEN-TETRACAINE-BENZOCAINE 2-2-14 % EX AERO
1.0000 | INHALATION_SPRAY | Freq: Once | CUTANEOUS | Status: DC
  Filled 2015-10-19: qty 20

## 2015-10-19 NOTE — Progress Notes (Signed)
[place] for Infectious Disease    Date of Admission:  10/13/2015      ID: [name] is a 39 y.o. 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 Afebrile. Wants to go home  Medications:  . arformoterol  15 mcg Nebulization BID  . azithromycin  500 mg Oral Daily  . budesonide  0.5 mg Nebulization BID  . ethambutol  1,000 mg Oral Daily  . feeding supplement (ENSURE ENLIVE)  237 mL Oral BID BM  . ferrous sulfate  325 mg Oral Daily  . itraconazole  200 mg Oral BID  . levothyroxine  50 mcg Oral QAC breakfast  . multivitamin with minerals  1 tablet Oral Daily  . predniSONE  10 mg Oral Daily  . tiotropium  18 mcg Inhalation Daily    Objective: Vital signs in last 24 hours: Temp:  [98 F (36.7 C)-98.2 F (36.8 C)] 98 F (36.7 C) (11/30 1400) Pulse Rate:  [65-86] 86 (11/30 1400) Resp:  [16-18] 16 (11/30 1400) BP: (101-121)/(66-79) 115/73 mmHg (11/30 1400) SpO2:  [93 %-97 %] 95 % (11/30 1400) Physical Exam  Constitutional: He is oriented to person, place, and time. He appears well-developed and well-nourished. No distress.  HENT:  Mouth/Throat: Oropharynx is clear and moist. No oropharyngeal exudate.  Cardiovascular: Normal rate, regular rhythm and normal heart sounds. Exam reveals no gallop and no friction rub.  No murmur heard.  Pulmonary/Chest: Effort normal and breath sounds normal. No respiratory distress. He has no wheezes.  Lymphadenopathy:  He has no cervical adenopathy.  Neurological: He is alert and oriented to person, place, and time.  Skin: Skin is warm and dry. No rash noted. No erythema.  Psychiatric: He has a normal mood and affect. His behavior is normal.   Lab Results  Microbiology: afb x 2 negative smear  Assessment/Plan: New LUL  lesion concerning for recurrent aspergilloma = differential is still broad. It would be rare to think this is voriconazole resistant aspergillosis but wonder for other etiology including pancoast tumor. Due the the patient's underlying bullous lung disease, he is too high risk for  biopsy as discussed with dr. Concepcion Living.   -  serology testing for gallactomannan is pending. ([facility] test code [phone], gold top, refrigerated) If excessively elevated, than c/w aspergillosis infection.   - Will plan for bronch on Thursday to get BAL to send for aerobic, AFB culture, fungal culture and cytology. Please also send pleural fluid for galactomannan  - He has had 3 rule out afb smear, no need for respiratory isolation  - we will keep the patient on voriconazole 373m BID, his home regimen until we reassess cultures from BAL  Chronic mac = continue with  Azithromycin and ethambutol  abpa = continue with steroids   Dr HJohnnye Simato follow up tomorrow  SBaxter FlatteryCAscension [facility] Hospital[facility] Cell: [phone]: [phone]  10/19/2015, 4:52 PM

## 2015-10-19 NOTE — Progress Notes (Signed)
PATIENT DETAILS Name: [name] Age: 39 y.o. 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 CT angiogram of the chest was positive for aspergilloma in the left upper lung bulla. Felt to be high risk for pneumothorax/VDRF if bronchoscopy performed-hence IR was consulted for embolization-no further hemoptysis since then. Transferred to hospitalists service on 11/26.  Subjective:  In bed, denies any fever or chills, no chest or abdominal pain. No further hemoptysis. No night sweats feels better.  Assessment/Plan:  Hemoptysis: Felt to be secondary to aspergillosis (fungal ball seen on CT chest 11/24). Felt to be high risk for pneumothorax/VDRF if underwent bronchoscopy-hence IR consulted and patient underwent empiric embolization of bilateral bronchial arteries. No further recurrence of hemoptysis. Sputum AFB 3  negative-discontinued isolation. Switched by ID from voriconazole to itraconazole on 10-17-15 (taking prior to admission-on lifelong voriconazole). Bronch per PCCM on 10-20-15, Will defer further management to PCCM & ID .   Acute hypoxic respiratory failure: Resolved. Secondary to above, CTA chest negative for PE.  History of aspergilloma status post VATS 2012: On chronic lifelong voriconazole now Switched by ID from voriconazole to itraconazole on 10-17-15 - followed at the infectious disease clinic. Concern for recurrent infection causing hemoptysis-see above.  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  Reviewed outpatient ID notes (Dr. Lucianne Lei dam 08/08/15)-unfortunately patient has recurrence of symptoms when Zithromax/ethambutol discontinued. ID on board here now.  Bullous lung disease: Stable.  Hypothyroidism: Continue Synthroid  GERD: Continue PPI  Severe protein calorie malnutrition: Continue supplements     Disposition: Remain inpatient-home when cleared by pulmonology/ID    Antimicrobial agents  See below  Anti-infectives    Start     Dose/Rate Route Frequency Ordered Stop   10/17/15 1430  itraconazole (SPORANOX) capsule 200 mg     200 mg Oral 2 times daily 10/17/15 1358     10/14/15 1000  azithromycin (ZITHROMAX) tablet 500 mg     500 mg Oral Daily 10/14/15 0836     10/13/15 1500  ethambutol (MYAMBUTOL) tablet 1,000 mg     1,000 mg Oral Daily 10/13/15 1057     10/13/15 1000  voriconazole (VFEND) tablet 300 mg  Status:  Discontinued     300 mg Oral Every 12 hours 10/13/15 0945 10/17/15 1358   10/13/15 0800  voriconazole (VFEND) 380 mg in sodium chloride 0.9 % 100 mL IVPB  Status:  Discontinued     6 mg/kg  63 kg 69 mL/hr over 120 Minutes Intravenous Every 12 hours 10/13/15 0638 10/13/15 0641   10/13/15 0745  azithromycin (ZITHROMAX) 500 mg in dextrose 5 % 250 mL IVPB     500 mg 250 mL/hr over 60 Minutes Intravenous  Once 10/13/15 0737 10/13/15 0850   10/13/15 0743  azithromycin (ZITHROMAX) 500 MG injection    Comments:  Rolla Etienne   : cabinet override      10/13/15 0743 10/13/15 0750      DVT Prophylaxis:  SCD's  Code Status: Full code  Family Communication None at bedside  Procedures: 11/24>>empiric embolization of bilateral bronchial arteries  CONSULTS:   pulmonary/intensive care  and IR, ID  Time spent  30 minutes-Greater than 50% of this time was spent in counseling, explanation of diagnosis, planning of further management, and coordination of care.  MEDICATIONS: Scheduled Meds: . arformoterol  15 mcg Nebulization BID  . azithromycin  500  mg Oral Daily  . budesonide  0.5 mg Nebulization BID  . ethambutol  1,000 mg Oral Daily  . feeding supplement (ENSURE ENLIVE)  237 mL Oral BID BM  . ferrous sulfate  325 mg Oral Daily  . itraconazole  200 mg Oral BID  . levothyroxine  50 mcg Oral QAC breakfast  . multivitamin with minerals  1 tablet Oral Daily  . predniSONE  10 mg Oral Daily  . tiotropium  18 mcg Inhalation Daily   Continuous Infusions: . sodium chloride 10 mL/hr at 10/15/15 1617   PRN Meds:.acetaminophen, albuterol    PHYSICAL EXAM:  Vital signs in last 24 hours:  Filed Vitals:   10/18/15 1530 10/18/15 2039 10/18/15 2235 10/19/15 0444  BP: 122/88  121/79 101/66  Pulse: 85  71 65  Temp: 98.2 F (36.8 C)  98.2 F (36.8 C) 98.2 F (36.8 C)  TempSrc: Oral  Oral Oral  Resp: _0 Height:      Weight:      SpO2: 96% 96% 97% 96%    Weight change:  Filed Weights   10/13/15 0600 10/13/15 0922 10/14/15 0327  Weight: 63 kg (138 lb 14.2 oz) 59.3 kg (130 lb 11.7 oz) 60.6 kg (133 lb 9.6 oz)   Body mass index is 19.72 kg/(m^2).   Gen Exam: Awake and alert with clear speech.  Neck: Supple, No JVD.   Chest: B/L Clear.   CVS: S1 S2 Regular, no murmurs.  Abdomen: soft, BS +, non tender, non distended.  Extremities: no edema, lower extremities warm to touch. Neurologic: Non Focal.   Skin: No Rash.   Wounds: N/A.   Intake/Output from previous day:  Intake/Output Summary (Last 24 hours) at 10/19/15 0751 Last data filed at 10/18/15 1330  Gross per 24 hour  Intake    480 ml  Output      0 ml  Net    480 ml     LAB RESULTS: CBC  Recent Labs Lab 10/13/15 0400 10/13/15 0420 10/14/15 0228 10/15/15 0444  WBC 9.7  --  19.0* 12.6*  HGB 15.4 16.0 14.6 13.5  HCT 44.9 47.0 42.9 39.6  PLT 237  --  203 166  MCV 88.4  --  90.3 89.6  MCH 30.3  --  30.7 30.5  MCHC 34.3  --  34.0 34.1  RDW 13.3  --  13.3 13.2  LYMPHSABS 1.9  --   --  1.0  MONOABS 0.4  --   --  0.8  EOSABS 0.1  --   --  0.1  BASOSABS  0.0  --   --  0.0    Chemistries   Recent Labs Lab 10/13/15 0400 10/13/15 0420 10/14/15 0228 10/15/15 0444  NA 139 137 133* 136  K 3.8 3.8 3.6 4.0  CL 104 108 101 105  CO2 28  --  25 24  GLUCOSE 136* 129* 97 104*  BUN _1 CREATININE 0.93 0.90 1.00 0.91  CALCIUM 8.4*  --  8.0* 7.9*    CBG:  Recent Labs Lab 10/13/15 0932  GLUCAP 78    GFR Estimated Creatinine Clearance: 91.6 mL/min (by C-G formula based on Cr of 0.91).  Coagulation  profile  Recent Labs Lab 10/13/15 0400  INR 0.95    Cardiac Enzymes No results for input(s): CKMB, TROPONINI, MYOGLOBIN in the last 168 hours.  Invalid input(s): CK  Invalid input(s): POCBNP No results for input(s): DDIMER in the last 72 hours. No results for input(s): HGBA1C in the last 72 hours. No results for input(s): CHOL, HDL, LDLCALC, TRIG, CHOLHDL, LDLDIRECT in the last 72 hours. No results for input(s): TSH, T4TOTAL, T3FREE, THYROIDAB in the last 72 hours.  Invalid input(s): FREET3 No results for input(s): VITAMINB12, FOLATE, FERRITIN, TIBC, IRON, RETICCTPCT in the last 72 hours. No results for input(s): LIPASE, AMYLASE in the last 72 hours.  Urine Studies No results for input(s): UHGB, CRYS in the last 72 hours.  Invalid input(s): UACOL, UAPR, USPG, UPH, UTP, UGL, UKET, UBIL, UNIT, UROB, ULEU, UEPI, UWBC, URBC, UBAC, CAST, UCOM, BILUA  MICROBIOLOGY: Recent Results (from the past 240 hour(s))  Blood culture (routine x 2)     Status: None   Collection Time: 10/13/15  4:30 AM  Result Value Ref Range Status   Specimen Description BLOOD RIGHT ANTECUBITAL  Final   Special Requests BOTTLES DRAWN AEROBIC AND ANAEROBIC 5CC  Final   Culture   Final    NO GROWTH 5 DAYS Performed at [facility]    Report Status 10/18/2015 FINAL  Final  Blood culture (routine x 2)     Status: None   Collection Time: 10/13/15  4:35 AM  Result Value Ref Range Status   Specimen Description BLOOD BLOOD RIGHT FOREARM  Final     Special Requests BOTTLES DRAWN AEROBIC AND ANAEROBIC 5CC  Final   Culture   Final    NO GROWTH 5 DAYS Performed at [facility]    Report Status 10/18/2015 FINAL  Final  MRSA PCR Screening     Status: None   Collection Time: 10/13/15  9:15 AM  Result Value Ref Range Status   MRSA by PCR NEGATIVE NEGATIVE Final    Comment:        The GeneXpert MRSA Assay (FDA approved for NASAL specimens only), is one component of a comprehensive MRSA colonization surveillance program. It is not intended to diagnose MRSA infection nor to guide or monitor treatment for MRSA infections.   AFB culture with smear     Status: None (Preliminary result)   Collection Time: 10/13/15 12:00 PM  Result Value Ref Range Status   Specimen Description SPUTUM  Final   Special Requests Immunocompromised  Final   Acid Fast Smear   Final    NO ACID FAST BACILLI SEEN Performed at [facility]    Culture   Final    CULTURE WILL BE EXAMINED FOR 6 WEEKS BEFORE ISSUING A FINAL REPORT Performed at [facility]    Report Status PENDING  Incomplete  Culture, expectorated sputum-assessment     Status: None   Collection Time: 10/13/15 12:00 PM  Result Value Ref Range Status   Specimen Description EXPECTORATED SPUTUM  Final   Special Requests Immunocompromised  Final   Sputum evaluation   Final    THIS SPECIMEN IS ACCEPTABLE. RESPIRATORY CULTURE REPORT TO FOLLOW.   Report Status 10/13/2015 FINAL  Final  Culture, respiratory (NON-Expectorated)     Status: None   Collection Time: 10/13/15 12:00 PM  Result Value Ref Range Status   Specimen Description SPUTUM  Final   Special Requests NONE  Final   Gram Stain   Final    ABUNDANT WBC PRESENT,BOTH  PMN AND MONONUCLEAR RARE SQUAMOUS EPITHELIAL CELLS PRESENT MODERATE GRAM POSITIVE COCCI IN PAIRS IN CHAINS IN CLUSTERS Performed at [facility]    Culture   Final    NORMAL OROPHARYNGEAL FLORA Performed at [facility]     Report Status 10/16/2015 FINAL  Final  Culture, expectorated sputum-assessment     Status: None   Collection Time: 10/15/15  2:26 AM  Result Value Ref Range Status   Specimen Description SPUTUM  Final   Special Requests Normal  Final   Sputum evaluation   Final    THIS SPECIMEN IS ACCEPTABLE. RESPIRATORY CULTURE REPORT TO FOLLOW.   Report Status 10/15/2015 FINAL  Final  Culture, respiratory (NON-Expectorated)     Status: None   Collection Time: 10/15/15  2:26 AM  Result Value Ref Range Status   Specimen Description EXPECTORATED SPUTUM  Final   Special Requests NONE  Final   Gram Stain   Final    RARE WBC PRESENT, PREDOMINANTLY PMN RARE SQUAMOUS EPITHELIAL CELLS PRESENT MODERATE GRAM POSITIVE COCCI IN PAIRS AND CHAINS Performed at [facility]    Culture   Final    NORMAL OROPHARYNGEAL FLORA Performed at [facility]    Report Status 10/18/2015 FINAL  Final  AFB culture with smear     Status: None (Preliminary result)   Collection Time: 10/16/15  2:27 PM  Result Value Ref Range Status   Specimen Description SPUTUM  Final   Special Requests NONE  Final   Acid Fast Smear   Final    NO ACID FAST BACILLI SEEN Performed at [facility]    Culture   Final    CULTURE WILL BE EXAMINED FOR 6 WEEKS BEFORE ISSUING A FINAL REPORT Performed at [facility]    Report Status PENDING  Incomplete  AFB culture with smear     Status: None (Preliminary result)   Collection Time: 10/16/15  6:52 PM  Result Value Ref Range Status   Specimen Description SPUTUM  Final   Special Requests Immunocompromised  Final   Acid Fast Smear   Final    NO ACID FAST BACILLI SEEN Performed at [facility]    Culture   Final    CULTURE WILL BE EXAMINED FOR 6 WEEKS BEFORE ISSUING A FINAL REPORT Performed at [facility]    Report Status PENDING  Incomplete    RADIOLOGY STUDIES/RESULTS: Ct Angio Chest Pe W/cm &/or Wo Cm  10/13/2015  CLINICAL DATA:  Acute  onset of hemoptysis, chest tightness and shortness of breath. Initial encounter. EXAM: CT ANGIOGRAPHY CHEST WITH CONTRAST TECHNIQUE: Multidetector CT imaging of the chest was performed using the standard protocol during bolus administration of intravenous contrast. Multiplanar CT image reconstructions and MIPs were obtained to evaluate the vascular anatomy. CONTRAST:  151m OMNIPAQUE IOHEXOL 350 MG/ML SOLN COMPARISON:  Chest radiograph performed earlier today at 4:25 a.m., and CT of the chest performed 03/05/2011 FINDINGS: There is no evidence of pulmonary embolus. The previously noted loculated left-sided pneumothorax has decreased significantly in size. The large bilateral bullae are again noted. There is new dense layering airspace opacification at the large left apical bulla, extending inferiorly along the posterior aspect of the left upper lobe, suspicious for acute fungal infection given its appearance. 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 This is in a slightly different distribution from the prior study, but appears relatively stable. There is no evidence of pleural effusion. A  new small right basilar pneumothorax is noted. Vague borderline prominent aortopulmonary window and subcarinal nodes are suggested, measuring up to 1.1 cm in short axis. The mediastinum is otherwise unremarkable. No pericardial effusion is identified. The great vessels are grossly unremarkable. No axillary lymphadenopathy is seen. The thyroid gland is unremarkable in appearance. The visualized portions of the liver and spleen are unremarkable. The visualized portions of the pancreas, stomach, adrenal glands and left kidney are within normal limits. No acute osseous abnormalities are seen. Review of the MIP images confirms the above findings. IMPRESSION: 1. No evidence of pulmonary embolus. 2. New dense layering airspace opacification at the patient's  large left apical bulla, extending inferiorly along the posterior aspect of the left upper lobe. This is suspicious for acute fungal infection (i.e., a fungal ball), given its appearance. 3. 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 4. Previously noted loculated left-sided pneumothorax has decreased significantly in size. 5. New small right basilar pneumothorax noted. 6. Underlying large bilateral bullae again noted. 7. Suggestion of mildly prominent mediastinal nodes. These results were called by telephone at the time of interpretation on 10/13/2015 at 6:05 am to Dr. Pryor Curia, who verbally acknowledged these results. Electronically Signed   By: Garald Balding M.D.   On: 10/13/2015 06:05   Ir Aorta/thoracic  10/13/2015  CLINICAL DATA:  39 year old male with a history of chronic pulmonary infection with MAC, as well as prior resection for Aspergilloma. He has developed hemoptysis, with significant bleeding on the prior night. Bronchoscopy could not be performed because of concerns for the patient's pulmonary status. Bilateral empiric embolization is planned. EXAM: ULTRASOUND GUIDED ACCESS RIGHT COMMON FEMORAL ARTERY THORACIC AORTIC ANGIOGRAM SELECTIVE ANGIOGRAM OF MULTIPLE BILATERAL THORACIC SEGMENTAL VESSELS INCLUDING BILATERAL BRONCHIAL ARTERIES. 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 Date:  11/24/201611/24/2016 3:52 pm FLUOROSCOPY TIME:  22 minutes, 24 seconds MEDICATIONS AND MEDICAL HISTORY: 1.0 mg Versed, 50 mcg fentanyl ANESTHESIA/SEDATION: 73 minutes CONTRAST:  217m OMNIPAQUE IOHEXOL 300 MG/ML  SOLN COMPLICATIONS: None PROCEDURE: Informed consent was obtained from the patient following explanation of the procedure, risks, benefits and alternatives. Specific risks include bleeding, infection,  arterial injury, need for further surgery or procedure, kidney injury, contrast reaction, non targeted embolization, neurologic injury/ deficit, cardiopulmonary collapse, death The patient understands, agrees and consents for the procedure. All questions were addressed. A time out was performed. Patient is position in the supine position on the fluoroscopy table. The right inguinal region was prepped and draped in the usual sterile fashion. Maximum sterile protection was used including gown and gloves mask. 1% lidocaine was used for local anesthesia. Ultrasound survey of the right inguinal region was performed with images stored and sent to PACs. A micropuncture needle was used access the right common femoral artery under ultrasound. With excellent arterial blood flow returned, and an .018 micro wire was passed through the needle, observed enter the abdominal aorta under fluoroscopy. The needle was removed, and a micropuncture sheath was placed over the wire. The inner dilator and wire were removed, and an 035 Bentson wire was advanced under fluoroscopy into the abdominal aorta. The sheath was removed and a standard 5 [place] sheath was placed. The dilator was removed and the sheath was flushed. Bentson wire was advanced to the aortic arch, and a pigtail catheter was passed over the wire to the aortic arch. Thoracic aortic angiogram was  performed. Pigtail catheter was exchanged for a Mickelson catheter which was used to select multiple segmental vessels of the thoracic arch. Angiogram was performed of each vessel selected. Once the right bronchial artery was engaged, angiogram was performed. Micro catheter system including a high-flow Renegade catheter was passed into the right bronchial artery, at a safe distance from the origin to avoid refluxing into the aorta. Empiric embolization was then performed with 500 - 700 micro meter embospheres. One vial was used. Micro catheter system was then removed, and a  final angiogram was performed through the base catheter. Mickelson catheter was then used to select multiple segmental vessels searching for left bronchial artery. Exchange was made over the Bentson wire for a shunt catheter. The Chung catheter was successful with engaging the origin of left bronchial artery. Angiogram performed. Micro catheter system was then advanced into the left bronchial artery for empiric embolization. Five hundred - 700 micro meter embospheres were used. One vial used. Repeat angiogram was performed. Angiogram of the right common femoral artery was performed. Exoseal was deployed. Patient tolerated the procedure well and remained hemodynamically stable throughout. No complications were encountered and no significant blood loss encountered. FINDINGS: Thoracic aorta demonstrates no dissection flap or aneurysm. No significant atherosclerotic disease along the lumen of the aorta. Multiple segmental vessels identified including 2 right bronchial artery and single left bronchial artery from the aortic angiogram. Superior right bronchial artery angiogram: Tortuous vasculature without stenosis at the origin. Vessel is of large caliber and supplies a cluster of abnormal vessels at the apex of the lung. Inflammatory tissue at the lung apex a demonstrates dense enhancement with no early draining vein identified. No evidence of arterial contribution towards the spinal canal. Status post microsphere embolization of the right superior bronchial artery there is no significant residual blood flow. Angiogram right supreme intercostal artery angiogram: No contribution to abnormal lung tissue. Angiogram right superior intercostal angiogram: No contribution to the lung apex. Left T9 segmental vessel angiogram: No contribution to the abnormal lung tissue. No reticular medullary vessel identified. Right inferior bronchial artery angiogram: No contribution to the abnormal vasculature at the right apex. Left T8  segmental vessel angiogram: No contribution to abnormal left-sided lung tissue. Left bronchial artery angiogram: Circuitous vasculature with significant contribution to abnormal lung tissue at the left apex. There is dense opacification of inflammatory tissue at the left apex. No extravasation is identified. No contribution of vasculature directed towards the spinal canal. Status post empiric embolization with 500-700 embospheres, no significant contribution of the left bronchial artery to the left apex. IMPRESSION: Status post thoracic aortic and segmental vessel angiogram with empiric embolization of left bronchial artery and superior right bronchial artery, each contributing significant blood flow to abnormal enhancing inflammatory tissue at the left and right apex, respectively. Both artery were embolized to stasis, with no significant opacification of abnormal tissue at the completion. Status post Exoseal deployment. Signed, Dulcy Fanny. Earleen Newport, DO [facility] Electronically Signed   By: Corrie Mckusick D.O.   On: 10/13/2015 19:21   Ir Angiogram Selective Each Additional Vessel  10/13/2015  CLINICAL DATA:  39 year old male with a history of chronic pulmonary infection with MAC, as well as prior resection for Aspergilloma. He has developed hemoptysis, with significant bleeding on the prior night. Bronchoscopy could not be performed because of concerns for the patient's pulmonary status. Bilateral empiric embolization is planned. EXAM: ULTRASOUND GUIDED ACCESS RIGHT COMMON FEMORAL ARTERY THORACIC AORTIC ANGIOGRAM SELECTIVE ANGIOGRAM OF MULTIPLE BILATERAL  THORACIC SEGMENTAL VESSELS INCLUDING BILATERAL BRONCHIAL ARTERIES. 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 Date:  11/24/201611/24/2016 3:52 pm FLUOROSCOPY TIME:  22 minutes, 24  seconds MEDICATIONS AND MEDICAL HISTORY: 1.0 mg Versed, 50 mcg fentanyl ANESTHESIA/SEDATION: 73 minutes CONTRAST:  256m OMNIPAQUE IOHEXOL 300 MG/ML  SOLN COMPLICATIONS: None PROCEDURE: Informed consent was obtained from the patient following explanation of the procedure, risks, benefits and alternatives. Specific risks include bleeding, infection, arterial injury, need for further surgery or procedure, kidney injury, contrast reaction, non targeted embolization, neurologic injury/ deficit, cardiopulmonary collapse, death The patient understands, agrees and consents for the procedure. All questions were addressed. A time out was performed. Patient is position in the supine position on the fluoroscopy table. The right inguinal region was prepped and draped in the usual sterile fashion. Maximum sterile protection was used including gown and gloves mask. 1% lidocaine was used for local anesthesia. Ultrasound survey of the right inguinal region was performed with images stored and sent to PACs. A micropuncture needle was used access the right common femoral artery under ultrasound. With excellent arterial blood flow returned, and an .018 micro wire was passed through the needle, observed enter the abdominal aorta under fluoroscopy. The needle was removed, and a micropuncture sheath was placed over the wire. The inner dilator and wire were removed, and an 035 Bentson wire was advanced under fluoroscopy into the abdominal aorta. The sheath was removed and a standard 5 [place] sheath was placed. The dilator was removed and the sheath was flushed. Bentson wire was advanced to the aortic arch, and a pigtail catheter was passed over the wire to the aortic arch. Thoracic aortic angiogram was performed. Pigtail catheter was exchanged for a Mickelson catheter which was used to select multiple segmental vessels of the thoracic arch. Angiogram was performed of each vessel selected. Once the right bronchial artery was  engaged, angiogram was performed. Micro catheter system including a high-flow Renegade catheter was passed into the right bronchial artery, at a safe distance from the origin to avoid refluxing into the aorta. Empiric embolization was then performed with 500 - 700 micro meter embospheres. One vial was used. Micro catheter system was then removed, and a final angiogram was performed through the base catheter. Mickelson catheter was then used to select multiple segmental vessels searching for left bronchial artery. Exchange was made over the Bentson wire for a shunt catheter. The Chung catheter was successful with engaging the origin of left bronchial artery. Angiogram performed. Micro catheter system was then advanced into the left bronchial artery for empiric embolization. Five hundred - 700 micro meter embospheres were used. One vial used. Repeat angiogram was performed. Angiogram of the right common femoral artery was performed. Exoseal was deployed. Patient tolerated the procedure well and remained hemodynamically stable throughout. No complications were encountered and no significant blood loss encountered. FINDINGS: Thoracic aorta demonstrates no dissection flap or aneurysm. No significant atherosclerotic disease along the lumen of the aorta. Multiple segmental vessels identified including 2 right bronchial artery and single left bronchial artery from the aortic angiogram. Superior right bronchial artery angiogram: Tortuous vasculature without stenosis at the origin. Vessel is of large caliber and supplies a cluster of abnormal vessels at the apex of the lung. Inflammatory tissue at the lung apex a demonstrates dense enhancement with no early draining vein identified. No evidence of arterial contribution towards the spinal canal. Status post microsphere embolization of  the right superior bronchial artery there is no significant residual blood flow. Angiogram right supreme intercostal artery angiogram: No  contribution to abnormal lung tissue. Angiogram right superior intercostal angiogram: No contribution to the lung apex. Left T9 segmental vessel angiogram: No contribution to the abnormal lung tissue. No reticular medullary vessel identified. Right inferior bronchial artery angiogram: No contribution to the abnormal vasculature at the right apex. Left T8 segmental vessel angiogram: No contribution to abnormal left-sided lung tissue. Left bronchial artery angiogram: Circuitous vasculature with significant contribution to abnormal lung tissue at the left apex. There is dense opacification of inflammatory tissue at the left apex. No extravasation is identified. No contribution of vasculature directed towards the spinal canal. Status post empiric embolization with 500-700 embospheres, no significant contribution of the left bronchial artery to the left apex. IMPRESSION: Status post thoracic aortic and segmental vessel angiogram with empiric embolization of left bronchial artery and superior right bronchial artery, each contributing significant blood flow to abnormal enhancing inflammatory tissue at the left and right apex, respectively. Both artery were embolized to stasis, with no significant opacification of abnormal tissue at the completion. Status post Exoseal deployment. Signed, Dulcy Fanny. Earleen Newport, DO [facility] Radiology Electronically Signed   By: Corrie Mckusick D.O.   On: 10/13/2015 19:21   Ir Angiogram Selective Each Additional Vessel  10/13/2015  CLINICAL DATA:  39 year old male with a history of chronic pulmonary infection with MAC, as well as prior resection for Aspergilloma. He has developed hemoptysis, with significant bleeding on the prior night. Bronchoscopy could not be performed because of concerns for the patient's pulmonary status. Bilateral empiric embolization is planned. EXAM: ULTRASOUND GUIDED ACCESS RIGHT COMMON FEMORAL ARTERY THORACIC AORTIC  ANGIOGRAM SELECTIVE ANGIOGRAM OF MULTIPLE BILATERAL THORACIC SEGMENTAL VESSELS INCLUDING BILATERAL BRONCHIAL ARTERIES. 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 Date:  11/24/201611/24/2016 3:52 pm FLUOROSCOPY TIME:  22 minutes, 24 seconds MEDICATIONS AND MEDICAL HISTORY: 1.0 mg Versed, 50 mcg fentanyl ANESTHESIA/SEDATION: 73 minutes CONTRAST:  262m OMNIPAQUE IOHEXOL 300 MG/ML  SOLN COMPLICATIONS: None PROCEDURE: Informed consent was obtained from the patient following explanation of the procedure, risks, benefits and alternatives. Specific risks include bleeding, infection, arterial injury, need for further surgery or procedure, kidney injury, contrast reaction, non targeted embolization, neurologic injury/ deficit, cardiopulmonary collapse, death The patient understands, agrees and consents for the procedure. All questions were addressed. A time out was performed. Patient is position in the supine position on the fluoroscopy table. The right inguinal region was prepped and draped in the usual sterile fashion. Maximum sterile protection was used including gown and gloves mask. 1% lidocaine was used for local anesthesia. Ultrasound survey of the right inguinal region was performed with images stored and sent to PACs. A micropuncture needle was used access the right common femoral artery under ultrasound. With excellent arterial blood flow returned, and an .018 micro wire was passed through the needle, observed enter the abdominal aorta under fluoroscopy. The needle was removed, and a micropuncture sheath was placed over the wire. The inner dilator and wire were removed, and an 035 Bentson wire was advanced under fluoroscopy into the abdominal aorta. The sheath was removed and a standard 5 [place] sheath was placed. The dilator was removed and the sheath was flushed. Bentson wire  was advanced to the aortic arch, and a pigtail catheter was passed over the wire to the aortic arch. Thoracic  aortic angiogram was performed. Pigtail catheter was exchanged for a Mickelson catheter which was used to select multiple segmental vessels of the thoracic arch. Angiogram was performed of each vessel selected. Once the right bronchial artery was engaged, angiogram was performed. Micro catheter system including a high-flow Renegade catheter was passed into the right bronchial artery, at a safe distance from the origin to avoid refluxing into the aorta. Empiric embolization was then performed with 500 - 700 micro meter embospheres. One vial was used. Micro catheter system was then removed, and a final angiogram was performed through the base catheter. Mickelson catheter was then used to select multiple segmental vessels searching for left bronchial artery. Exchange was made over the Bentson wire for a shunt catheter. The Chung catheter was successful with engaging the origin of left bronchial artery. Angiogram performed. Micro catheter system was then advanced into the left bronchial artery for empiric embolization. Five hundred - 700 micro meter embospheres were used. One vial used. Repeat angiogram was performed. Angiogram of the right common femoral artery was performed. Exoseal was deployed. Patient tolerated the procedure well and remained hemodynamically stable throughout. No complications were encountered and no significant blood loss encountered. FINDINGS: Thoracic aorta demonstrates no dissection flap or aneurysm. No significant atherosclerotic disease along the lumen of the aorta. Multiple segmental vessels identified including 2 right bronchial artery and single left bronchial artery from the aortic angiogram. Superior right bronchial artery angiogram: Tortuous vasculature without stenosis at the origin. Vessel is of large caliber and supplies a cluster of abnormal vessels at the apex of the lung.  Inflammatory tissue at the lung apex a demonstrates dense enhancement with no early draining vein identified. No evidence of arterial contribution towards the spinal canal. Status post microsphere embolization of the right superior bronchial artery there is no significant residual blood flow. Angiogram right supreme intercostal artery angiogram: No contribution to abnormal lung tissue. Angiogram right superior intercostal angiogram: No contribution to the lung apex. Left T9 segmental vessel angiogram: No contribution to the abnormal lung tissue. No reticular medullary vessel identified. Right inferior bronchial artery angiogram: No contribution to the abnormal vasculature at the right apex. Left T8 segmental vessel angiogram: No contribution to abnormal left-sided lung tissue. Left bronchial artery angiogram: Circuitous vasculature with significant contribution to abnormal lung tissue at the left apex. There is dense opacification of inflammatory tissue at the left apex. No extravasation is identified. No contribution of vasculature directed towards the spinal canal. Status post empiric embolization with 500-700 embospheres, no significant contribution of the left bronchial artery to the left apex. IMPRESSION: Status post thoracic aortic and segmental vessel angiogram with empiric embolization of left bronchial artery and superior right bronchial artery, each contributing significant blood flow to abnormal enhancing inflammatory tissue at the left and right apex, respectively. Both artery were embolized to stasis, with no significant opacification of abnormal tissue at the completion. Status post Exoseal deployment. Signed, Dulcy Fanny. Earleen Newport, DO [facility] Radiology Electronically Signed   By: Corrie Mckusick D.O.   On: 10/13/2015 19:21   Ir Angiogram Selective Each Additional Vessel  10/13/2015  CLINICAL DATA:  39 year old male with a history of chronic pulmonary  infection with MAC, as well as prior resection for Aspergilloma. He has developed hemoptysis, with significant bleeding on the prior night. Bronchoscopy could not be performed because of concerns for the patient's pulmonary status. Bilateral empiric embolization is planned. EXAM: ULTRASOUND GUIDED ACCESS RIGHT COMMON FEMORAL ARTERY THORACIC AORTIC ANGIOGRAM SELECTIVE ANGIOGRAM  OF MULTIPLE BILATERAL THORACIC SEGMENTAL VESSELS INCLUDING BILATERAL BRONCHIAL ARTERIES. 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 Date:  11/24/201611/24/2016 3:52 pm FLUOROSCOPY TIME:  22 minutes, 24 seconds MEDICATIONS AND MEDICAL HISTORY: 1.0 mg Versed, 50 mcg fentanyl ANESTHESIA/SEDATION: 73 minutes CONTRAST:  278m OMNIPAQUE IOHEXOL 300 MG/ML  SOLN COMPLICATIONS: None PROCEDURE: Informed consent was obtained from the patient following explanation of the procedure, risks, benefits and alternatives. Specific risks include bleeding, infection, arterial injury, need for further surgery or procedure, kidney injury, contrast reaction, non targeted embolization, neurologic injury/ deficit, cardiopulmonary collapse, death The patient understands, agrees and consents for the procedure. All questions were addressed. A time out was performed. Patient is position in the supine position on the fluoroscopy table. The right inguinal region was prepped and draped in the usual sterile fashion. Maximum sterile protection was used including gown and gloves mask. 1% lidocaine was used for local anesthesia. Ultrasound survey of the right inguinal region was performed with images stored and sent to PACs. A micropuncture needle was used access the right common femoral artery under ultrasound. With excellent arterial blood flow returned, and an .018 micro wire was passed through the needle, observed enter the abdominal aorta under  fluoroscopy. The needle was removed, and a micropuncture sheath was placed over the wire. The inner dilator and wire were removed, and an 035 Bentson wire was advanced under fluoroscopy into the abdominal aorta. The sheath was removed and a standard 5 [place] sheath was placed. The dilator was removed and the sheath was flushed. Bentson wire was advanced to the aortic arch, and a pigtail catheter was passed over the wire to the aortic arch. Thoracic aortic angiogram was performed. Pigtail catheter was exchanged for a Mickelson catheter which was used to select multiple segmental vessels of the thoracic arch. Angiogram was performed of each vessel selected. Once the right bronchial artery was engaged, angiogram was performed. Micro catheter system including a high-flow Renegade catheter was passed into the right bronchial artery, at a safe distance from the origin to avoid refluxing into the aorta. Empiric embolization was then performed with 500 - 700 micro meter embospheres. One vial was used. Micro catheter system was then removed, and a final angiogram was performed through the base catheter. Mickelson catheter was then used to select multiple segmental vessels searching for left bronchial artery. Exchange was made over the Bentson wire for a shunt catheter. The Chung catheter was successful with engaging the origin of left bronchial artery. Angiogram performed. Micro catheter system was then advanced into the left bronchial artery for empiric embolization. Five hundred - 700 micro meter embospheres were used. One vial used. Repeat angiogram was performed. Angiogram of the right common femoral artery was performed. Exoseal was deployed. Patient tolerated the procedure well and remained hemodynamically stable throughout. No complications were encountered and no significant blood loss encountered. FINDINGS: Thoracic aorta demonstrates no dissection flap or aneurysm. No significant atherosclerotic disease  along the lumen of the aorta. Multiple segmental vessels identified including 2 right bronchial artery and single left bronchial artery from the aortic angiogram. Superior right bronchial artery angiogram: Tortuous vasculature without stenosis at the origin. Vessel is of large caliber and supplies a cluster of abnormal vessels at the apex of the lung. Inflammatory tissue at the lung apex a demonstrates dense enhancement with no early draining vein identified. No evidence of arterial contribution towards the spinal canal. Status post  microsphere embolization of the right superior bronchial artery there is no significant residual blood flow. Angiogram right supreme intercostal artery angiogram: No contribution to abnormal lung tissue. Angiogram right superior intercostal angiogram: No contribution to the lung apex. Left T9 segmental vessel angiogram: No contribution to the abnormal lung tissue. No reticular medullary vessel identified. Right inferior bronchial artery angiogram: No contribution to the abnormal vasculature at the right apex. Left T8 segmental vessel angiogram: No contribution to abnormal left-sided lung tissue. Left bronchial artery angiogram: Circuitous vasculature with significant contribution to abnormal lung tissue at the left apex. There is dense opacification of inflammatory tissue at the left apex. No extravasation is identified. No contribution of vasculature directed towards the spinal canal. Status post empiric embolization with 500-700 embospheres, no significant contribution of the left bronchial artery to the left apex. IMPRESSION: Status post thoracic aortic and segmental vessel angiogram with empiric embolization of left bronchial artery and superior right bronchial artery, each contributing significant blood flow to abnormal enhancing inflammatory tissue at the left and right apex, respectively. Both artery were embolized to stasis, with no significant opacification of abnormal tissue at  the completion. Status post Exoseal deployment. Signed, Dulcy Fanny. Earleen Newport, DO [facility] Radiology Electronically Signed   By: Corrie Mckusick D.O.   On: 10/13/2015 19:21   Ir Angiogram Selective Each Additional Vessel  10/13/2015  CLINICAL DATA:  39 year old male with a history of chronic pulmonary infection with MAC, as well as prior resection for Aspergilloma. He has developed hemoptysis, with significant bleeding on the prior night. Bronchoscopy could not be performed because of concerns for the patient's pulmonary status. Bilateral empiric embolization is planned. EXAM: ULTRASOUND GUIDED ACCESS RIGHT COMMON FEMORAL ARTERY THORACIC AORTIC ANGIOGRAM SELECTIVE ANGIOGRAM OF MULTIPLE BILATERAL THORACIC SEGMENTAL VESSELS INCLUDING BILATERAL BRONCHIAL ARTERIES. 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 Date:  11/24/201611/24/2016 3:52 pm FLUOROSCOPY TIME:  22 minutes, 24 seconds MEDICATIONS AND MEDICAL HISTORY: 1.0 mg Versed, 50 mcg fentanyl ANESTHESIA/SEDATION: 73 minutes CONTRAST:  264m OMNIPAQUE IOHEXOL 300 MG/ML  SOLN COMPLICATIONS: None PROCEDURE: Informed consent was obtained from the patient following explanation of the procedure, risks, benefits and alternatives. Specific risks include bleeding, infection, arterial injury, need for further surgery or procedure, kidney injury, contrast reaction, non targeted embolization, neurologic injury/ deficit, cardiopulmonary collapse, death The patient understands, agrees and consents for the procedure. All questions were addressed. A time out was performed. Patient is position in the supine position on the fluoroscopy table. The right inguinal region was prepped and draped in the usual sterile fashion. Maximum sterile protection was used including gown and gloves mask. 1% lidocaine was used  for local anesthesia. Ultrasound survey of the right inguinal region was performed with images stored and sent to PACs. A micropuncture needle was used access the right common femoral artery under ultrasound. With excellent arterial blood flow returned, and an .018 micro wire was passed through the needle, observed enter the abdominal aorta under fluoroscopy. The needle was removed, and a micropuncture sheath was placed over the wire. The inner dilator and wire were removed, and an 035 Bentson wire was advanced under fluoroscopy into the abdominal aorta. The sheath was removed and a standard 5 [place] sheath was placed. The dilator was removed and the sheath was flushed. Bentson wire was advanced to the aortic arch, and a pigtail catheter was passed over the wire to the aortic  arch. Thoracic aortic angiogram was performed. Pigtail catheter was exchanged for a Mickelson catheter which was used to select multiple segmental vessels of the thoracic arch. Angiogram was performed of each vessel selected. Once the right bronchial artery was engaged, angiogram was performed. Micro catheter system including a high-flow Renegade catheter was passed into the right bronchial artery, at a safe distance from the origin to avoid refluxing into the aorta. Empiric embolization was then performed with 500 - 700 micro meter embospheres. One vial was used. Micro catheter system was then removed, and a final angiogram was performed through the base catheter. Mickelson catheter was then used to select multiple segmental vessels searching for left bronchial artery. Exchange was made over the Bentson wire for a shunt catheter. The Chung catheter was successful with engaging the origin of left bronchial artery. Angiogram performed. Micro catheter system was then advanced into the left bronchial artery for empiric embolization. Five hundred - 700 micro meter embospheres were used. One vial used. Repeat angiogram was performed.  Angiogram of the right common femoral artery was performed. Exoseal was deployed. Patient tolerated the procedure well and remained hemodynamically stable throughout. No complications were encountered and no significant blood loss encountered. FINDINGS: Thoracic aorta demonstrates no dissection flap or aneurysm. No significant atherosclerotic disease along the lumen of the aorta. Multiple segmental vessels identified including 2 right bronchial artery and single left bronchial artery from the aortic angiogram. Superior right bronchial artery angiogram: Tortuous vasculature without stenosis at the origin. Vessel is of large caliber and supplies a cluster of abnormal vessels at the apex of the lung. Inflammatory tissue at the lung apex a demonstrates dense enhancement with no early draining vein identified. No evidence of arterial contribution towards the spinal canal. Status post microsphere embolization of the right superior bronchial artery there is no significant residual blood flow. Angiogram right supreme intercostal artery angiogram: No contribution to abnormal lung tissue. Angiogram right superior intercostal angiogram: No contribution to the lung apex. Left T9 segmental vessel angiogram: No contribution to the abnormal lung tissue. No reticular medullary vessel identified. Right inferior bronchial artery angiogram: No contribution to the abnormal vasculature at the right apex. Left T8 segmental vessel angiogram: No contribution to abnormal left-sided lung tissue. Left bronchial artery angiogram: Circuitous vasculature with significant contribution to abnormal lung tissue at the left apex. There is dense opacification of inflammatory tissue at the left apex. No extravasation is identified. No contribution of vasculature directed towards the spinal canal. Status post empiric embolization with 500-700 embospheres, no significant contribution of the left bronchial artery to the left apex. IMPRESSION: Status post  thoracic aortic and segmental vessel angiogram with empiric embolization of left bronchial artery and superior right bronchial artery, each contributing significant blood flow to abnormal enhancing inflammatory tissue at the left and right apex, respectively. Both artery were embolized to stasis, with no significant opacification of abnormal tissue at the completion. Status post Exoseal deployment. Signed, Dulcy Fanny. Earleen Newport, DO [facility] Radiology Electronically Signed   By: Corrie Mckusick D.O.   On: 10/13/2015 19:21   Ir Angiogram Selective Each Additional Vessel  10/13/2015  CLINICAL DATA:  39 year old male with a history of chronic pulmonary infection with MAC, as well as prior resection for Aspergilloma. He has developed hemoptysis, with significant bleeding on the prior night. Bronchoscopy could not be performed because of concerns for the patient's pulmonary status. Bilateral empiric embolization is planned. EXAM: ULTRASOUND GUIDED ACCESS RIGHT COMMON FEMORAL ARTERY THORACIC AORTIC ANGIOGRAM  SELECTIVE ANGIOGRAM OF MULTIPLE BILATERAL THORACIC SEGMENTAL VESSELS INCLUDING BILATERAL BRONCHIAL ARTERIES. 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 Date:  11/24/201611/24/2016 3:52 pm FLUOROSCOPY TIME:  22 minutes, 24 seconds MEDICATIONS AND MEDICAL HISTORY: 1.0 mg Versed, 50 mcg fentanyl ANESTHESIA/SEDATION: 73 minutes CONTRAST:  261m OMNIPAQUE IOHEXOL 300 MG/ML  SOLN COMPLICATIONS: None PROCEDURE: Informed consent was obtained from the patient following explanation of the procedure, risks, benefits and alternatives. Specific risks include bleeding, infection, arterial injury, need for further surgery or procedure, kidney injury, contrast reaction, non targeted embolization, neurologic injury/ deficit, cardiopulmonary collapse, death The  patient understands, agrees and consents for the procedure. All questions were addressed. A time out was performed. Patient is position in the supine position on the fluoroscopy table. The right inguinal region was prepped and draped in the usual sterile fashion. Maximum sterile protection was used including gown and gloves mask. 1% lidocaine was used for local anesthesia. Ultrasound survey of the right inguinal region was performed with images stored and sent to PACs. A micropuncture needle was used access the right common femoral artery under ultrasound. With excellent arterial blood flow returned, and an .018 micro wire was passed through the needle, observed enter the abdominal aorta under fluoroscopy. The needle was removed, and a micropuncture sheath was placed over the wire. The inner dilator and wire were removed, and an 035 Bentson wire was advanced under fluoroscopy into the abdominal aorta. The sheath was removed and a standard 5 [place] sheath was placed. The dilator was removed and the sheath was flushed. Bentson wire was advanced to the aortic arch, and a pigtail catheter was passed over the wire to the aortic arch. Thoracic aortic angiogram was performed. Pigtail catheter was exchanged for a Mickelson catheter which was used to select multiple segmental vessels of the thoracic arch. Angiogram was performed of each vessel selected. Once the right bronchial artery was engaged, angiogram was performed. Micro catheter system including a high-flow Renegade catheter was passed into the right bronchial artery, at a safe distance from the origin to avoid refluxing into the aorta. Empiric embolization was then performed with 500 - 700 micro meter embospheres. One vial was used. Micro catheter system was then removed, and a final angiogram was performed through the base catheter. Mickelson catheter was then used to select multiple segmental vessels searching for left bronchial artery. Exchange was made  over the Bentson wire for a shunt catheter. The Chung catheter was successful with engaging the origin of left bronchial artery. Angiogram performed. Micro catheter system was then advanced into the left bronchial artery for empiric embolization. Five hundred - 700 micro meter embospheres were used. One vial used. Repeat angiogram was performed. Angiogram of the right common femoral artery was performed. Exoseal was deployed. Patient tolerated the procedure well and remained hemodynamically stable throughout. No complications were encountered and no significant blood loss encountered. FINDINGS: Thoracic aorta demonstrates no dissection flap or aneurysm. No significant atherosclerotic disease along the lumen of the aorta. Multiple segmental vessels identified including 2 right bronchial artery and single left bronchial artery from the aortic angiogram. Superior right bronchial artery angiogram: Tortuous vasculature without stenosis at the origin. Vessel is of large caliber and supplies a cluster of abnormal vessels at the apex of the lung. Inflammatory tissue at the lung apex a demonstrates dense enhancement with no early draining vein identified. No evidence of arterial contribution towards the spinal canal.  Status post microsphere embolization of the right superior bronchial artery there is no significant residual blood flow. Angiogram right supreme intercostal artery angiogram: No contribution to abnormal lung tissue. Angiogram right superior intercostal angiogram: No contribution to the lung apex. Left T9 segmental vessel angiogram: No contribution to the abnormal lung tissue. No reticular medullary vessel identified. Right inferior bronchial artery angiogram: No contribution to the abnormal vasculature at the right apex. Left T8 segmental vessel angiogram: No contribution to abnormal left-sided lung tissue. Left bronchial artery angiogram: Circuitous vasculature with significant contribution to abnormal lung  tissue at the left apex. There is dense opacification of inflammatory tissue at the left apex. No extravasation is identified. No contribution of vasculature directed towards the spinal canal. Status post empiric embolization with 500-700 embospheres, no significant contribution of the left bronchial artery to the left apex. IMPRESSION: Status post thoracic aortic and segmental vessel angiogram with empiric embolization of left bronchial artery and superior right bronchial artery, each contributing significant blood flow to abnormal enhancing inflammatory tissue at the left and right apex, respectively. Both artery were embolized to stasis, with no significant opacification of abnormal tissue at the completion. Status post Exoseal deployment. Signed, Dulcy Fanny. Earleen Newport, DO [facility] Radiology Electronically Signed   By: Corrie Mckusick D.O.   On: 10/13/2015 19:21   Ir Angiogram Selective Each Additional Vessel  10/13/2015  CLINICAL DATA:  39 year old male with a history of chronic pulmonary infection with MAC, as well as prior resection for Aspergilloma. He has developed hemoptysis, with significant bleeding on the prior night. Bronchoscopy could not be performed because of concerns for the patient's pulmonary status. Bilateral empiric embolization is planned. EXAM: ULTRASOUND GUIDED ACCESS RIGHT COMMON FEMORAL ARTERY THORACIC AORTIC ANGIOGRAM SELECTIVE ANGIOGRAM OF MULTIPLE BILATERAL THORACIC SEGMENTAL VESSELS INCLUDING BILATERAL BRONCHIAL ARTERIES. 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 Date:  11/24/201611/24/2016 3:52 pm FLUOROSCOPY TIME:  22 minutes, 24 seconds MEDICATIONS AND MEDICAL HISTORY: 1.0 mg Versed, 50 mcg fentanyl ANESTHESIA/SEDATION: 73 minutes CONTRAST:  270m OMNIPAQUE IOHEXOL 300 MG/ML  SOLN COMPLICATIONS: None  PROCEDURE: Informed consent was obtained from the patient following explanation of the procedure, risks, benefits and alternatives. Specific risks include bleeding, infection, arterial injury, need for further surgery or procedure, kidney injury, contrast reaction, non targeted embolization, neurologic injury/ deficit, cardiopulmonary collapse, death The patient understands, agrees and consents for the procedure. All questions were addressed. A time out was performed. Patient is position in the supine position on the fluoroscopy table. The right inguinal region was prepped and draped in the usual sterile fashion. Maximum sterile protection was used including gown and gloves mask. 1% lidocaine was used for local anesthesia. Ultrasound survey of the right inguinal region was performed with images stored and sent to PACs. A micropuncture needle was used access the right common femoral artery under ultrasound. With excellent arterial blood flow returned, and an .018 micro wire was passed through the needle, observed enter the abdominal aorta under fluoroscopy. The needle was removed, and a micropuncture sheath was placed over the wire. The inner dilator and wire were removed, and an 035 Bentson wire was advanced under fluoroscopy into the abdominal aorta. The sheath was removed and a standard 5 [place] sheath was placed. The dilator was removed and the sheath was flushed. Bentson wire was advanced to the aortic arch, and a pigtail catheter was passed over the wire to  the aortic arch. Thoracic aortic angiogram was performed. Pigtail catheter was exchanged for a Mickelson catheter which was used to select multiple segmental vessels of the thoracic arch. Angiogram was performed of each vessel selected. Once the right bronchial artery was engaged, angiogram was performed. Micro catheter system including a high-flow Renegade catheter was passed into the right bronchial artery, at a safe distance from the origin to  avoid refluxing into the aorta. Empiric embolization was then performed with 500 - 700 micro meter embospheres. One vial was used. Micro catheter system was then removed, and a final angiogram was performed through the base catheter. Mickelson catheter was then used to select multiple segmental vessels searching for left bronchial artery. Exchange was made over the Bentson wire for a shunt catheter. The Chung catheter was successful with engaging the origin of left bronchial artery. Angiogram performed. Micro catheter system was then advanced into the left bronchial artery for empiric embolization. Five hundred - 700 micro meter embospheres were used. One vial used. Repeat angiogram was performed. Angiogram of the right common femoral artery was performed. Exoseal was deployed. Patient tolerated the procedure well and remained hemodynamically stable throughout. No complications were encountered and no significant blood loss encountered. FINDINGS: Thoracic aorta demonstrates no dissection flap or aneurysm. No significant atherosclerotic disease along the lumen of the aorta. Multiple segmental vessels identified including 2 right bronchial artery and single left bronchial artery from the aortic angiogram. Superior right bronchial artery angiogram: Tortuous vasculature without stenosis at the origin. Vessel is of large caliber and supplies a cluster of abnormal vessels at the apex of the lung. Inflammatory tissue at the lung apex a demonstrates dense enhancement with no early draining vein identified. No evidence of arterial contribution towards the spinal canal. Status post microsphere embolization of the right superior bronchial artery there is no significant residual blood flow. Angiogram right supreme intercostal artery angiogram: No contribution to abnormal lung tissue. Angiogram right superior intercostal angiogram: No contribution to the lung apex. Left T9 segmental vessel angiogram: No contribution to the  abnormal lung tissue. No reticular medullary vessel identified. Right inferior bronchial artery angiogram: No contribution to the abnormal vasculature at the right apex. Left T8 segmental vessel angiogram: No contribution to abnormal left-sided lung tissue. Left bronchial artery angiogram: Circuitous vasculature with significant contribution to abnormal lung tissue at the left apex. There is dense opacification of inflammatory tissue at the left apex. No extravasation is identified. No contribution of vasculature directed towards the spinal canal. Status post empiric embolization with 500-700 embospheres, no significant contribution of the left bronchial artery to the left apex. IMPRESSION: Status post thoracic aortic and segmental vessel angiogram with empiric embolization of left bronchial artery and superior right bronchial artery, each contributing significant blood flow to abnormal enhancing inflammatory tissue at the left and right apex, respectively. Both artery were embolized to stasis, with no significant opacification of abnormal tissue at the completion. Status post Exoseal deployment. Signed, Dulcy Fanny. Earleen Newport, DO [facility] Radiology Electronically Signed   By: Corrie Mckusick D.O.   On: 10/13/2015 19:21   Ir Angiogram Selective Each Additional Vessel  10/13/2015  CLINICAL DATA:  39 year old male with a history of chronic pulmonary infection with MAC, as well as prior resection for Aspergilloma. He has developed hemoptysis, with significant bleeding on the prior night. Bronchoscopy could not be performed because of concerns for the patient's pulmonary status. Bilateral empiric embolization is planned. EXAM: ULTRASOUND GUIDED ACCESS RIGHT COMMON FEMORAL ARTERY THORACIC  AORTIC ANGIOGRAM SELECTIVE ANGIOGRAM OF MULTIPLE BILATERAL THORACIC SEGMENTAL VESSELS INCLUDING BILATERAL BRONCHIAL ARTERIES. 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 Date:  11/24/201611/24/2016 3:52 pm FLUOROSCOPY TIME:  22 minutes, 24 seconds MEDICATIONS AND MEDICAL HISTORY: 1.0 mg Versed, 50 mcg fentanyl ANESTHESIA/SEDATION: 73 minutes CONTRAST:  243m OMNIPAQUE IOHEXOL 300 MG/ML  SOLN COMPLICATIONS: None PROCEDURE: Informed consent was obtained from the patient following explanation of the procedure, risks, benefits and alternatives. Specific risks include bleeding, infection, arterial injury, need for further surgery or procedure, kidney injury, contrast reaction, non targeted embolization, neurologic injury/ deficit, cardiopulmonary collapse, death The patient understands, agrees and consents for the procedure. All questions were addressed. A time out was performed. Patient is position in the supine position on the fluoroscopy table. The right inguinal region was prepped and draped in the usual sterile fashion. Maximum sterile protection was used including gown and gloves mask. 1% lidocaine was used for local anesthesia. Ultrasound survey of the right inguinal region was performed with images stored and sent to PACs. A micropuncture needle was used access the right common femoral artery under ultrasound. With excellent arterial blood flow returned, and an .018 micro wire was passed through the needle, observed enter the abdominal aorta under fluoroscopy. The needle was removed, and a micropuncture sheath was placed over the wire. The inner dilator and wire were removed, and an 035 Bentson wire was advanced under fluoroscopy into the abdominal aorta. The sheath was removed and a standard 5 [place] sheath was placed. The dilator was removed and the sheath was flushed. Bentson wire was advanced to the aortic arch, and a pigtail catheter was passed over the wire to the aortic arch. Thoracic aortic angiogram was performed. Pigtail catheter was exchanged for a  Mickelson catheter which was used to select multiple segmental vessels of the thoracic arch. Angiogram was performed of each vessel selected. Once the right bronchial artery was engaged, angiogram was performed. Micro catheter system including a high-flow Renegade catheter was passed into the right bronchial artery, at a safe distance from the origin to avoid refluxing into the aorta. Empiric embolization was then performed with 500 - 700 micro meter embospheres. One vial was used. Micro catheter system was then removed, and a final angiogram was performed through the base catheter. Mickelson catheter was then used to select multiple segmental vessels searching for left bronchial artery. Exchange was made over the Bentson wire for a shunt catheter. The Chung catheter was successful with engaging the origin of left bronchial artery. Angiogram performed. Micro catheter system was then advanced into the left bronchial artery for empiric embolization. Five hundred - 700 micro meter embospheres were used. One vial used. Repeat angiogram was performed. Angiogram of the right common femoral artery was performed. Exoseal was deployed. Patient tolerated the procedure well and remained hemodynamically stable throughout. No complications were encountered and no significant blood loss encountered. FINDINGS: Thoracic aorta demonstrates no dissection flap or aneurysm. No significant atherosclerotic disease along the lumen of the aorta. Multiple segmental vessels identified including 2 right bronchial artery and single left bronchial artery from the aortic angiogram. Superior right bronchial artery angiogram: Tortuous vasculature without stenosis at the origin. Vessel is of large caliber and supplies a cluster of abnormal vessels at the apex of the lung. Inflammatory tissue at the lung apex a demonstrates dense enhancement with no early draining vein identified. No evidence of arterial contribution towards the  spinal canal. Status  post microsphere embolization of the right superior bronchial artery there is no significant residual blood flow. Angiogram right supreme intercostal artery angiogram: No contribution to abnormal lung tissue. Angiogram right superior intercostal angiogram: No contribution to the lung apex. Left T9 segmental vessel angiogram: No contribution to the abnormal lung tissue. No reticular medullary vessel identified. Right inferior bronchial artery angiogram: No contribution to the abnormal vasculature at the right apex. Left T8 segmental vessel angiogram: No contribution to abnormal left-sided lung tissue. Left bronchial artery angiogram: Circuitous vasculature with significant contribution to abnormal lung tissue at the left apex. There is dense opacification of inflammatory tissue at the left apex. No extravasation is identified. No contribution of vasculature directed towards the spinal canal. Status post empiric embolization with 500-700 embospheres, no significant contribution of the left bronchial artery to the left apex. IMPRESSION: Status post thoracic aortic and segmental vessel angiogram with empiric embolization of left bronchial artery and superior right bronchial artery, each contributing significant blood flow to abnormal enhancing inflammatory tissue at the left and right apex, respectively. Both artery were embolized to stasis, with no significant opacification of abnormal tissue at the completion. Status post Exoseal deployment. Signed, Dulcy Fanny. Earleen Newport, DO [facility] Radiology Electronically Signed   By: Corrie Mckusick D.O.   On: 10/13/2015 19:21   Ir Angiogram Follow Up Study  10/13/2015  CLINICAL DATA:  39 year old male with a history of chronic pulmonary infection with MAC, as well as prior resection for Aspergilloma. He has developed hemoptysis, with significant bleeding on the prior night. Bronchoscopy could not be performed because of concerns for  the patient's pulmonary status. Bilateral empiric embolization is planned. EXAM: ULTRASOUND GUIDED ACCESS RIGHT COMMON FEMORAL ARTERY THORACIC AORTIC ANGIOGRAM SELECTIVE ANGIOGRAM OF MULTIPLE BILATERAL THORACIC SEGMENTAL VESSELS INCLUDING BILATERAL BRONCHIAL ARTERIES. 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 Date:  11/24/201611/24/2016 3:52 pm FLUOROSCOPY TIME:  22 minutes, 24 seconds MEDICATIONS AND MEDICAL HISTORY: 1.0 mg Versed, 50 mcg fentanyl ANESTHESIA/SEDATION: 73 minutes CONTRAST:  283m OMNIPAQUE IOHEXOL 300 MG/ML  SOLN COMPLICATIONS: None PROCEDURE: Informed consent was obtained from the patient following explanation of the procedure, risks, benefits and alternatives. Specific risks include bleeding, infection, arterial injury, need for further surgery or procedure, kidney injury, contrast reaction, non targeted embolization, neurologic injury/ deficit, cardiopulmonary collapse, death The patient understands, agrees and consents for the procedure. All questions were addressed. A time out was performed. Patient is position in the supine position on the fluoroscopy table. The right inguinal region was prepped and draped in the usual sterile fashion. Maximum sterile protection was used including gown and gloves mask. 1% lidocaine was used for local anesthesia. Ultrasound survey of the right inguinal region was performed with images stored and sent to PACs. A micropuncture needle was used access the right common femoral artery under ultrasound. With excellent arterial blood flow returned, and an .018 micro wire was passed through the needle, observed enter the abdominal aorta under fluoroscopy. The needle was removed, and a micropuncture sheath was placed over the wire. The inner dilator and wire were removed, and an 035 Bentson wire was advanced under fluoroscopy into the  abdominal aorta. The sheath was removed and a standard 5 [place] sheath was placed. The dilator was removed and the sheath was flushed. Bentson wire was advanced to the aortic arch, and a pigtail catheter was passed over the  wire to the aortic arch. Thoracic aortic angiogram was performed. Pigtail catheter was exchanged for a Mickelson catheter which was used to select multiple segmental vessels of the thoracic arch. Angiogram was performed of each vessel selected. Once the right bronchial artery was engaged, angiogram was performed. Micro catheter system including a high-flow Renegade catheter was passed into the right bronchial artery, at a safe distance from the origin to avoid refluxing into the aorta. Empiric embolization was then performed with 500 - 700 micro meter embospheres. One vial was used. Micro catheter system was then removed, and a final angiogram was performed through the base catheter. Mickelson catheter was then used to select multiple segmental vessels searching for left bronchial artery. Exchange was made over the Bentson wire for a shunt catheter. The Chung catheter was successful with engaging the origin of left bronchial artery. Angiogram performed. Micro catheter system was then advanced into the left bronchial artery for empiric embolization. Five hundred - 700 micro meter embospheres were used. One vial used. Repeat angiogram was performed. Angiogram of the right common femoral artery was performed. Exoseal was deployed. Patient tolerated the procedure well and remained hemodynamically stable throughout. No complications were encountered and no significant blood loss encountered. FINDINGS: Thoracic aorta demonstrates no dissection flap or aneurysm. No significant atherosclerotic disease along the lumen of the aorta. Multiple segmental vessels identified including 2 right bronchial artery and single left bronchial artery from the aortic angiogram. Superior right bronchial artery  angiogram: Tortuous vasculature without stenosis at the origin. Vessel is of large caliber and supplies a cluster of abnormal vessels at the apex of the lung. Inflammatory tissue at the lung apex a demonstrates dense enhancement with no early draining vein identified. No evidence of arterial contribution towards the spinal canal. Status post microsphere embolization of the right superior bronchial artery there is no significant residual blood flow. Angiogram right supreme intercostal artery angiogram: No contribution to abnormal lung tissue. Angiogram right superior intercostal angiogram: No contribution to the lung apex. Left T9 segmental vessel angiogram: No contribution to the abnormal lung tissue. No reticular medullary vessel identified. Right inferior bronchial artery angiogram: No contribution to the abnormal vasculature at the right apex. Left T8 segmental vessel angiogram: No contribution to abnormal left-sided lung tissue. Left bronchial artery angiogram: Circuitous vasculature with significant contribution to abnormal lung tissue at the left apex. There is dense opacification of inflammatory tissue at the left apex. No extravasation is identified. No contribution of vasculature directed towards the spinal canal. Status post empiric embolization with 500-700 embospheres, no significant contribution of the left bronchial artery to the left apex. IMPRESSION: Status post thoracic aortic and segmental vessel angiogram with empiric embolization of left bronchial artery and superior right bronchial artery, each contributing significant blood flow to abnormal enhancing inflammatory tissue at the left and right apex, respectively. Both artery were embolized to stasis, with no significant opacification of abnormal tissue at the completion. Status post Exoseal deployment. Signed, Dulcy Fanny. Earleen Newport, DO [facility] Radiology Electronically Signed   By: Corrie Mckusick D.O.    On: 10/13/2015 19:21   Ir Angiogram Follow Up Study  10/13/2015  CLINICAL DATA:  39 year old male with a history of chronic pulmonary infection with MAC, as well as prior resection for Aspergilloma. He has developed hemoptysis, with significant bleeding on the prior night. Bronchoscopy could not be performed because of concerns for the patient's pulmonary status. Bilateral empiric embolization is planned. EXAM: ULTRASOUND GUIDED ACCESS RIGHT COMMON FEMORAL ARTERY  THORACIC AORTIC ANGIOGRAM SELECTIVE ANGIOGRAM OF MULTIPLE BILATERAL THORACIC SEGMENTAL VESSELS INCLUDING BILATERAL BRONCHIAL ARTERIES. 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 Date:  11/24/201611/24/2016 3:52 pm FLUOROSCOPY TIME:  22 minutes, 24 seconds MEDICATIONS AND MEDICAL HISTORY: 1.0 mg Versed, 50 mcg fentanyl ANESTHESIA/SEDATION: 73 minutes CONTRAST:  223m OMNIPAQUE IOHEXOL 300 MG/ML  SOLN COMPLICATIONS: None PROCEDURE: Informed consent was obtained from the patient following explanation of the procedure, risks, benefits and alternatives. Specific risks include bleeding, infection, arterial injury, need for further surgery or procedure, kidney injury, contrast reaction, non targeted embolization, neurologic injury/ deficit, cardiopulmonary collapse, death The patient understands, agrees and consents for the procedure. All questions were addressed. A time out was performed. Patient is position in the supine position on the fluoroscopy table. The right inguinal region was prepped and draped in the usual sterile fashion. Maximum sterile protection was used including gown and gloves mask. 1% lidocaine was used for local anesthesia. Ultrasound survey of the right inguinal region was performed with images stored and sent to PACs. A micropuncture needle was used access the right common femoral artery under ultrasound. With  excellent arterial blood flow returned, and an .018 micro wire was passed through the needle, observed enter the abdominal aorta under fluoroscopy. The needle was removed, and a micropuncture sheath was placed over the wire. The inner dilator and wire were removed, and an 035 Bentson wire was advanced under fluoroscopy into the abdominal aorta. The sheath was removed and a standard 5 [place] sheath was placed. The dilator was removed and the sheath was flushed. Bentson wire was advanced to the aortic arch, and a pigtail catheter was passed over the wire to the aortic arch. Thoracic aortic angiogram was performed. Pigtail catheter was exchanged for a Mickelson catheter which was used to select multiple segmental vessels of the thoracic arch. Angiogram was performed of each vessel selected. Once the right bronchial artery was engaged, angiogram was performed. Micro catheter system including a high-flow Renegade catheter was passed into the right bronchial artery, at a safe distance from the origin to avoid refluxing into the aorta. Empiric embolization was then performed with 500 - 700 micro meter embospheres. One vial was used. Micro catheter system was then removed, and a final angiogram was performed through the base catheter. Mickelson catheter was then used to select multiple segmental vessels searching for left bronchial artery. Exchange was made over the Bentson wire for a shunt catheter. The Chung catheter was successful with engaging the origin of left bronchial artery. Angiogram performed. Micro catheter system was then advanced into the left bronchial artery for empiric embolization. Five hundred - 700 micro meter embospheres were used. One vial used. Repeat angiogram was performed. Angiogram of the right common femoral artery was performed. Exoseal was deployed. Patient tolerated the procedure well and remained hemodynamically stable throughout. No complications were encountered and no significant  blood loss encountered. FINDINGS: Thoracic aorta demonstrates no dissection flap or aneurysm. No significant atherosclerotic disease along the lumen of the aorta. Multiple segmental vessels identified including 2 right bronchial artery and single left bronchial artery from the aortic angiogram. Superior right bronchial artery angiogram: Tortuous vasculature without stenosis at the origin. Vessel is of large caliber and supplies a cluster of abnormal vessels at the apex of the lung. Inflammatory tissue at the lung apex a demonstrates dense enhancement with no early draining vein identified. No evidence of arterial contribution towards  the spinal canal. Status post microsphere embolization of the right superior bronchial artery there is no significant residual blood flow. Angiogram right supreme intercostal artery angiogram: No contribution to abnormal lung tissue. Angiogram right superior intercostal angiogram: No contribution to the lung apex. Left T9 segmental vessel angiogram: No contribution to the abnormal lung tissue. No reticular medullary vessel identified. Right inferior bronchial artery angiogram: No contribution to the abnormal vasculature at the right apex. Left T8 segmental vessel angiogram: No contribution to abnormal left-sided lung tissue. Left bronchial artery angiogram: Circuitous vasculature with significant contribution to abnormal lung tissue at the left apex. There is dense opacification of inflammatory tissue at the left apex. No extravasation is identified. No contribution of vasculature directed towards the spinal canal. Status post empiric embolization with 500-700 embospheres, no significant contribution of the left bronchial artery to the left apex. IMPRESSION: Status post thoracic aortic and segmental vessel angiogram with empiric embolization of left bronchial artery and superior right bronchial artery, each contributing significant blood flow to abnormal enhancing inflammatory tissue at  the left and right apex, respectively. Both artery were embolized to stasis, with no significant opacification of abnormal tissue at the completion. Status post Exoseal deployment. Signed, Dulcy Fanny. Earleen Newport, DO [facility] Radiology Electronically Signed   By: Corrie Mckusick D.O.   On: 10/13/2015 19:21   Ir US Guide Vasc Access Right  10/13/2015  CLINICAL DATA:  39 year old male with a history of chronic pulmonary infection with MAC, as well as prior resection for Aspergilloma. He has developed hemoptysis, with significant bleeding on the prior night. Bronchoscopy could not be performed because of concerns for the patient's pulmonary status. Bilateral empiric embolization is planned. EXAM: ULTRASOUND GUIDED ACCESS RIGHT COMMON FEMORAL ARTERY THORACIC AORTIC ANGIOGRAM SELECTIVE ANGIOGRAM OF MULTIPLE BILATERAL THORACIC SEGMENTAL VESSELS INCLUDING BILATERAL BRONCHIAL ARTERIES. 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 Date:  11/24/201611/24/2016 3:52 pm FLUOROSCOPY TIME:  22 minutes, 24 seconds MEDICATIONS AND MEDICAL HISTORY: 1.0 mg Versed, 50 mcg fentanyl ANESTHESIA/SEDATION: 73 minutes CONTRAST:  263m OMNIPAQUE IOHEXOL 300 MG/ML  SOLN COMPLICATIONS: None PROCEDURE: Informed consent was obtained from the patient following explanation of the procedure, risks, benefits and alternatives. Specific risks include bleeding, infection, arterial injury, need for further surgery or procedure, kidney injury, contrast reaction, non targeted embolization, neurologic injury/ deficit, cardiopulmonary collapse, death The patient understands, agrees and consents for the procedure. All questions were addressed. A time out was performed. Patient is position in the supine position on the fluoroscopy table. The right inguinal region was prepped and draped in  the usual sterile fashion. Maximum sterile protection was used including gown and gloves mask. 1% lidocaine was used for local anesthesia. Ultrasound survey of the right inguinal region was performed with images stored and sent to PACs. A micropuncture needle was used access the right common femoral artery under ultrasound. With excellent arterial blood flow returned, and an .018 micro wire was passed through the needle, observed enter the abdominal aorta under fluoroscopy. The needle was removed, and a micropuncture sheath was placed over the wire. The inner dilator and wire were removed, and an 035 Bentson wire was advanced under fluoroscopy into the abdominal aorta. The sheath was removed and a standard 5 [place] sheath was placed. The dilator was removed and the sheath was flushed. Bentson wire was advanced to the aortic arch, and a pigtail catheter was passed over  the wire to the aortic arch. Thoracic aortic angiogram was performed. Pigtail catheter was exchanged for a Mickelson catheter which was used to select multiple segmental vessels of the thoracic arch. Angiogram was performed of each vessel selected. Once the right bronchial artery was engaged, angiogram was performed. Micro catheter system including a high-flow Renegade catheter was passed into the right bronchial artery, at a safe distance from the origin to avoid refluxing into the aorta. Empiric embolization was then performed with 500 - 700 micro meter embospheres. One vial was used. Micro catheter system was then removed, and a final angiogram was performed through the base catheter. Mickelson catheter was then used to select multiple segmental vessels searching for left bronchial artery. Exchange was made over the Bentson wire for a shunt catheter. The Chung catheter was successful with engaging the origin of left bronchial artery. Angiogram performed. Micro catheter system was then advanced into the left bronchial artery for empiric  embolization. Five hundred - 700 micro meter embospheres were used. One vial used. Repeat angiogram was performed. Angiogram of the right common femoral artery was performed. Exoseal was deployed. Patient tolerated the procedure well and remained hemodynamically stable throughout. No complications were encountered and no significant blood loss encountered. FINDINGS: Thoracic aorta demonstrates no dissection flap or aneurysm. No significant atherosclerotic disease along the lumen of the aorta. Multiple segmental vessels identified including 2 right bronchial artery and single left bronchial artery from the aortic angiogram. Superior right bronchial artery angiogram: Tortuous vasculature without stenosis at the origin. Vessel is of large caliber and supplies a cluster of abnormal vessels at the apex of the lung. Inflammatory tissue at the lung apex a demonstrates dense enhancement with no early draining vein identified. No evidence of arterial contribution towards the spinal canal. Status post microsphere embolization of the right superior bronchial artery there is no significant residual blood flow. Angiogram right supreme intercostal artery angiogram: No contribution to abnormal lung tissue. Angiogram right superior intercostal angiogram: No contribution to the lung apex. Left T9 segmental vessel angiogram: No contribution to the abnormal lung tissue. No reticular medullary vessel identified. Right inferior bronchial artery angiogram: No contribution to the abnormal vasculature at the right apex. Left T8 segmental vessel angiogram: No contribution to abnormal left-sided lung tissue. Left bronchial artery angiogram: Circuitous vasculature with significant contribution to abnormal lung tissue at the left apex. There is dense opacification of inflammatory tissue at the left apex. No extravasation is identified. No contribution of vasculature directed towards the spinal canal. Status post empiric embolization with  500-700 embospheres, no significant contribution of the left bronchial artery to the left apex. IMPRESSION: Status post thoracic aortic and segmental vessel angiogram with empiric embolization of left bronchial artery and superior right bronchial artery, each contributing significant blood flow to abnormal enhancing inflammatory tissue at the left and right apex, respectively. Both artery were embolized to stasis, with no significant opacification of abnormal tissue at the completion. Status post Exoseal deployment. Signed, Dulcy Fanny. Earleen Newport, DO [facility] Radiology Electronically Signed   By: Corrie Mckusick D.O.   On: 10/13/2015 19:21   Dg Chest Port 1 View  10/14/2015  CLINICAL DATA:  Hemoptysis, mycobacterium avium intracellular complex. EXAM: PORTABLE CHEST 1 VIEW COMPARISON:  October 13, 2015. FINDINGS: Stable cardiomediastinal silhouette. Bilateral upper lobe bulla are again noted and unchanged. Associated scarring in the both upper lobes is noted. Opacity is again noted in the left upper lobe most likely representing fungus ball. No significant pleural  effusion is noted. Bony thorax is unremarkable. IMPRESSION: Stable bilateral bulla formation and scarring is noted in both upper lobes. Soft tissue density is again noted in left upper lobe most likely representing fungus ball as described on prior CT scan. No significant changes noted compared to prior exam. Electronically Signed   By: Marijo Conception, M.D.   On: 10/14/2015 07:41   Dg Chest Portable 1 View  10/13/2015  CLINICAL DATA:  Acute onset of hemoptysis and shortness of breath. Initial encounter. EXAM: PORTABLE CHEST 1 VIEW COMPARISON:  Chest radiograph performed 09/28/2014 FINDINGS: There appears to be an enlarging 3.9 cm mass near the left lung apex. Biapical pleural parenchymal scarring and bullous change are again seen, with somewhat worsening bilateral pulmonary nodularity. Pleural thickening  near the left lung mass appears worsened, raising concern for local spread of disease. No pleural effusion or pneumothorax is seen. The cardiomediastinal silhouette is normal in size. No acute osseous abnormalities are identified. A chronic left fourth rib deformity is noted. IMPRESSION: 1. Apparent enlarging 3.9 cm mass near the left lung apex, concerning for malignancy. Pleural thickening near the left lung mass appears worsened, raising concern for local spread of disease. CT of the chest would be helpful for further evaluation, when and as deemed clinically appropriate. 2. Somewhat worsening bilateral pulmonary nodularity noted. Biapical pleural parenchymal scarring and bullous change again noted. Electronically Signed   By: Garald Balding M.D.   On: 10/13/2015 05:22   [place] Guide Roadmapping  10/13/2015  CLINICAL DATA:  39 year old male with a history of chronic pulmonary infection with MAC, as well as prior resection for Aspergilloma. He has developed hemoptysis, with significant bleeding on the prior night. Bronchoscopy could not be performed because of concerns for the patient's pulmonary status. Bilateral empiric embolization is planned. EXAM: ULTRASOUND GUIDED ACCESS RIGHT COMMON FEMORAL ARTERY THORACIC AORTIC ANGIOGRAM SELECTIVE ANGIOGRAM OF MULTIPLE BILATERAL THORACIC SEGMENTAL VESSELS INCLUDING BILATERAL BRONCHIAL ARTERIES. 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 Date:  11/24/201611/24/2016 3:52 pm FLUOROSCOPY TIME:  22 minutes, 24 seconds MEDICATIONS AND MEDICAL HISTORY: 1.0 mg Versed, 50 mcg fentanyl ANESTHESIA/SEDATION: 73 minutes CONTRAST:  23m OMNIPAQUE IOHEXOL 300 MG/ML  SOLN COMPLICATIONS: None PROCEDURE: Informed consent was obtained from the patient following explanation of the procedure, risks, benefits and alternatives.  Specific risks include bleeding, infection, arterial injury, need for further surgery or procedure, kidney injury, contrast reaction, non targeted embolization, neurologic injury/ deficit, cardiopulmonary collapse, death The patient understands, agrees and consents for the procedure. All questions were addressed. A time out was performed. Patient is position in the supine position on the fluoroscopy table. The right inguinal region was prepped and draped in the usual sterile fashion. Maximum sterile protection was used including gown and gloves mask. 1% lidocaine was used for local anesthesia. Ultrasound survey of the right inguinal region was performed with images stored and sent to PACs. A micropuncture needle was used access the right common femoral artery under ultrasound. With excellent arterial blood flow returned, and an .018 micro wire was passed through the needle, observed enter the abdominal aorta under fluoroscopy. The needle was removed, and a micropuncture sheath was placed over the wire. The inner dilator and wire were removed, and an 035 Bentson wire was advanced under fluoroscopy into the abdominal aorta. The sheath was removed and a standard 5 [place] sheath was  placed. The dilator was removed and the sheath was flushed. Bentson wire was advanced to the aortic arch, and a pigtail catheter was passed over the wire to the aortic arch. Thoracic aortic angiogram was performed. Pigtail catheter was exchanged for a Mickelson catheter which was used to select multiple segmental vessels of the thoracic arch. Angiogram was performed of each vessel selected. Once the right bronchial artery was engaged, angiogram was performed. Micro catheter system including a high-flow Renegade catheter was passed into the right bronchial artery, at a safe distance from the origin to avoid refluxing into the aorta. Empiric embolization was then performed with 500 - 700 micro meter embospheres. One vial was used.  Micro catheter system was then removed, and a final angiogram was performed through the base catheter. Mickelson catheter was then used to select multiple segmental vessels searching for left bronchial artery. Exchange was made over the Bentson wire for a shunt catheter. The Chung catheter was successful with engaging the origin of left bronchial artery. Angiogram performed. Micro catheter system was then advanced into the left bronchial artery for empiric embolization. Five hundred - 700 micro meter embospheres were used. One vial used. Repeat angiogram was performed. Angiogram of the right common femoral artery was performed. Exoseal was deployed. Patient tolerated the procedure well and remained hemodynamically stable throughout. No complications were encountered and no significant blood loss encountered. FINDINGS: Thoracic aorta demonstrates no dissection flap or aneurysm. No significant atherosclerotic disease along the lumen of the aorta. Multiple segmental vessels identified including 2 right bronchial artery and single left bronchial artery from the aortic angiogram. Superior right bronchial artery angiogram: Tortuous vasculature without stenosis at the origin. Vessel is of large caliber and supplies a cluster of abnormal vessels at the apex of the lung. Inflammatory tissue at the lung apex a demonstrates dense enhancement with no early draining vein identified. No evidence of arterial contribution towards the spinal canal. Status post microsphere embolization of the right superior bronchial artery there is no significant residual blood flow. Angiogram right supreme intercostal artery angiogram: No contribution to abnormal lung tissue. Angiogram right superior intercostal angiogram: No contribution to the lung apex. Left T9 segmental vessel angiogram: No contribution to the abnormal lung tissue. No reticular medullary vessel identified. Right inferior bronchial artery angiogram: No contribution to the  abnormal vasculature at the right apex. Left T8 segmental vessel angiogram: No contribution to abnormal left-sided lung tissue. Left bronchial artery angiogram: Circuitous vasculature with significant contribution to abnormal lung tissue at the left apex. There is dense opacification of inflammatory tissue at the left apex. No extravasation is identified. No contribution of vasculature directed towards the spinal canal. Status post empiric embolization with 500-700 embospheres, no significant contribution of the left bronchial artery to the left apex. IMPRESSION: Status post thoracic aortic and segmental vessel angiogram with empiric embolization of left bronchial artery and superior right bronchial artery, each contributing significant blood flow to abnormal enhancing inflammatory tissue at the left and right apex, respectively. Both artery were embolized to stasis, with no significant opacification of abnormal tissue at the completion. Status post Exoseal deployment. Signed, Dulcy Fanny. Earleen Newport, DO [facility] Radiology Electronically Signed   By: Corrie Mckusick D.O.   On: 10/13/2015 19:21   [place] Guide Roadmapping  10/13/2015  CLINICAL DATA:  39 year old male with a history of chronic pulmonary infection with MAC, as well as prior resection for Aspergilloma. He has developed hemoptysis,  with significant bleeding on the prior night. Bronchoscopy could not be performed because of concerns for the patient's pulmonary status. Bilateral empiric embolization is planned. EXAM: ULTRASOUND GUIDED ACCESS RIGHT COMMON FEMORAL ARTERY THORACIC AORTIC ANGIOGRAM SELECTIVE ANGIOGRAM OF MULTIPLE BILATERAL THORACIC SEGMENTAL VESSELS INCLUDING BILATERAL BRONCHIAL ARTERIES. 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 Date:   11/24/201611/24/2016 3:52 pm FLUOROSCOPY TIME:  22 minutes, 24 seconds MEDICATIONS AND MEDICAL HISTORY: 1.0 mg Versed, 50 mcg fentanyl ANESTHESIA/SEDATION: 73 minutes CONTRAST:  290m OMNIPAQUE IOHEXOL 300 MG/ML  SOLN COMPLICATIONS: None PROCEDURE: Informed consent was obtained from the patient following explanation of the procedure, risks, benefits and alternatives. Specific risks include bleeding, infection, arterial injury, need for further surgery or procedure, kidney injury, contrast reaction, non targeted embolization, neurologic injury/ deficit, cardiopulmonary collapse, death The patient understands, agrees and consents for the procedure. All questions were addressed. A time out was performed. Patient is position in the supine position on the fluoroscopy table. The right inguinal region was prepped and draped in the usual sterile fashion. Maximum sterile protection was used including gown and gloves mask. 1% lidocaine was used for local anesthesia. Ultrasound survey of the right inguinal region was performed with images stored and sent to PACs. A micropuncture needle was used access the right common femoral artery under ultrasound. With excellent arterial blood flow returned, and an .018 micro wire was passed through the needle, observed enter the abdominal aorta under fluoroscopy. The needle was removed, and a micropuncture sheath was placed over the wire. The inner dilator and wire were removed, and an 035 Bentson wire was advanced under fluoroscopy into the abdominal aorta. The sheath was removed and a standard 5 [place] sheath was placed. The dilator was removed and the sheath was flushed. Bentson wire was advanced to the aortic arch, and a pigtail catheter was passed over the wire to the aortic arch. Thoracic aortic angiogram was performed. Pigtail catheter was exchanged for a Mickelson catheter which was used to select multiple segmental vessels of the thoracic arch. Angiogram was performed of  each vessel selected. Once the right bronchial artery was engaged, angiogram was performed. Micro catheter system including a high-flow Renegade catheter was passed into the right bronchial artery, at a safe distance from the origin to avoid refluxing into the aorta. Empiric embolization was then performed with 500 - 700 micro meter embospheres. One vial was used. Micro catheter system was then removed, and a final angiogram was performed through the base catheter. Mickelson catheter was then used to select multiple segmental vessels searching for left bronchial artery. Exchange was made over the Bentson wire for a shunt catheter. The Chung catheter was successful with engaging the origin of left bronchial artery. Angiogram performed. Micro catheter system was then advanced into the left bronchial artery for empiric embolization. Five hundred - 700 micro meter embospheres were used. One vial used. Repeat angiogram was performed. Angiogram of the right common femoral artery was performed. Exoseal was deployed. Patient tolerated the procedure well and remained hemodynamically stable throughout. No complications were encountered and no significant blood loss encountered. FINDINGS: Thoracic aorta demonstrates no dissection flap or aneurysm. No significant atherosclerotic disease along the lumen of the aorta. Multiple segmental vessels identified including 2 right bronchial artery and single left bronchial artery from the aortic angiogram. Superior right bronchial artery angiogram: Tortuous vasculature without stenosis at the origin. Vessel is of large caliber and supplies  a cluster of abnormal vessels at the apex of the lung. Inflammatory tissue at the lung apex a demonstrates dense enhancement with no early draining vein identified. No evidence of arterial contribution towards the spinal canal. Status post microsphere embolization of the right superior bronchial artery there is no significant residual blood flow.  Angiogram right supreme intercostal artery angiogram: No contribution to abnormal lung tissue. Angiogram right superior intercostal angiogram: No contribution to the lung apex. Left T9 segmental vessel angiogram: No contribution to the abnormal lung tissue. No reticular medullary vessel identified. Right inferior bronchial artery angiogram: No contribution to the abnormal vasculature at the right apex. Left T8 segmental vessel angiogram: No contribution to abnormal left-sided lung tissue. Left bronchial artery angiogram: Circuitous vasculature with significant contribution to abnormal lung tissue at the left apex. There is dense opacification of inflammatory tissue at the left apex. No extravasation is identified. No contribution of vasculature directed towards the spinal canal. Status post empiric embolization with 500-700 embospheres, no significant contribution of the left bronchial artery to the left apex. IMPRESSION: Status post thoracic aortic and segmental vessel angiogram with empiric embolization of left bronchial artery and superior right bronchial artery, each contributing significant blood flow to abnormal enhancing inflammatory tissue at the left and right apex, respectively. Both artery were embolized to stasis, with no significant opacification of abnormal tissue at the completion. Status post Exoseal deployment. Signed, Dulcy Fanny. Earleen Newport, DO [facility] Radiology Electronically Signed   By: Corrie Mckusick D.O.   On: 10/13/2015 19:21    Thurnell Lose, MD  [facility] Pager:[phone]  If 7PM-7AM, please contact night-coverage [email or web address] Password [email or web address] 10/19/2015, 7:51 AM   LOS: 6 days

## 2015-10-19 NOTE — Progress Notes (Signed)
PULMONARY / CRITICAL CARE MEDICINE   Name: [name] MRN: [id number] DOB: April 30, 1976    ADMISSION DATE:  10/13/2015 CONSULTATION DATE:  10/13/15  REFERRING MD :  [facility]   CHIEF COMPLAINT:  Hemoptysis    SUBJECTIVE:  Pt anxious to go home.  Understands need for procedure in am.  Hopeful to be able to discharge after procedure.    VITAL SIGNS: BP 101/66 mmHg  Pulse 65  Temp(Src) 98.2 F (36.8 C) (Oral)  Resp 16  Ht _0  (1.753 m)  Wt 133 lb 9.6 oz (60.6 kg)  BMI 19.72 kg/m2  SpO2 93%  INTAKE / OUTPUT: I/O last 3 completed shifts: In: 1200 [P.O.:1200] Out: -   PHYSICAL EXAMINATION: General:  Thin male, in no acute distress Neuro:  AAOx4, MAE, normal strength HEENT:  Moist mucus membranes, no jvd Cardiovascular:  S1, S2, No MRG Lungs:  Clear, distant, No wheeze or crackles Abdomen:  NTND, + BS Musculoskeletal:  No acute deformities  Skin:  Intact, multiple tattoos  LABS:  CBC  Recent Labs Lab 10/13/15 0400 10/13/15 0420 10/14/15 0228 10/15/15 0444  WBC 9.7  --  19.0* 12.6*  HGB 15.4 16.0 14.6 13.5  HCT 44.9 47.0 42.9 39.6  PLT 237  --  203 166   Coag's  Recent Labs Lab 10/13/15 0400  APTT 31  INR 0.95   BMET  Recent Labs Lab 10/13/15 0400 10/13/15 0420 10/14/15 0228 10/15/15 0444  NA 139 137 133* 136  K 3.8 3.8 3.6 4.0  CL 104 108 101 105  CO2 28  --  25 24  BUN _1 CREATININE 0.93 0.90 1.00 0.91  GLUCOSE 136* 129* 97 104*   Electrolytes  Recent Labs Lab 10/13/15 0400 10/14/15 0228 10/15/15 0444  CALCIUM 8.4* 8.0* 7.9*   Sepsis Markers  Recent Labs Lab 10/13/15 0416  LATICACIDVEN 1.44   ABG No results for input(s): PHART, PCO2ART, PO2ART in the last 168 hours.   Liver Enzymes  Recent Labs Lab 10/13/15 0400  AST 24  ALT 14*  ALKPHOS 96  BILITOT 1.1  ALBUMIN 3.5   Cardiac Enzymes No results for input(s): TROPONINI, PROBNP in the last 168 hours.   Glucose  Recent Labs Lab  10/13/15 0932  GLUCAP 78    Imaging    STUDIES:  11/24  CTA Chest >> no PE, new dense layering airspace in L apical bulla concerning for fungal ball, numerous scattered nodules, loculated L sided pneumothorax decreased in size, small R basilar pneumothorax, and large bilateral bullae.   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 Underwent bronchial artery embolization 11/25 for hemoptysis.  Pt felt to be too high risk for FOB due to bullous lung disease with concern for prolonged VDRF if intubation required.     ASSESSMENT / PLAN:  PULMONARY A: Hemoptysis - acute episode  11/24 am, approx 100-200 ml . Bleeding controlled.  High risk for PTX / prolonged vent if bronch S/p bilateral embolization bronchial arteries  Acute Hypoxemia - in setting of hemoptysis Concern for new Fungal Ball - see 11/24 CT, concern for voriconazole resistance with development of new fungal ball on therapy ABPA - on chronic prednisone 10 mg MAC - on azithro / ethambutol Unlikely TB  P:   Planned FOB in am 12/1 @ 11 am Pre-bronch order set in place, NPO after MN Pulmonary hygiene Continue chronic medications - azithromycin, ethambutol, prednisone  Intermittent CXR , follow clinically for bleeding Budesonide / Pulmicort  Spiriva  Q6 PRN Albuterol Appreciate ID input regarding therapy changes, note  change to itraconazole 11/28.  He uses [facility] for mail order Rx, will need short term Rx to get him by until mail order can be recieved ID discussing with [facility] regarding change to itraconazole    RENAL A:   S/p contrast for bilateral BA at risk ATN  P:   Trend BMP / UOP  No lasix  GASTROINTESTINAL A:   Protein Calorie Malnutrition  Weight Loss - approx 10 lbs since 08/2015 P:   Regular diet as tolerated, NPO after MN  Ensure   HEMATOLOGIC A:   Hemoptyisis P:  Continue home ferrous sulfate  Monitor CBC  SCD's for DVT prophylaxis     Noe Gens, NP-C [facility] Pgr: [phone] or if no answer [phone] 10/19/2015, 10:26 AM

## 2015-10-19 NOTE — Progress Notes (Signed)
Initial Nutrition Assessment  DOCUMENTATION CODES:   Severe malnutrition in context of chronic illness  INTERVENTION:   -Continue Ensure Enlive po BID, each supplement provides 350 kcal and 20 grams of protein -Continue MVI daily  NUTRITION DIAGNOSIS:   Malnutrition related to chronic illness as evidenced by severe depletion of muscle mass, severe depletion of body fat, percent weight loss (13% weight loss within the past 6 months).  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   Pt s/p empiric embolization on 10/13/15.   Per MD notes, hemoptysis has been resolved.    Reviewed ID note from 10/18/15. He remains on anti-fungal for aspergilloma.   Spoke with RN; she reports that pt's appetite has improved, however, typically does not each much at meals. Meal completion records reveal intake is variable, but improved (PO: 15-100%). RN reports that pt loves his Ensure Shakes and is taking them well. RD will continue order.   Per RN, AFB negative and pt has been taken off precautions. Plan to do perform bronchoscopy on 10/20/15 and discharge tomorrow,   Labs reviewed.   Diet Order:  Diet regular Room service appropriate?: Yes; Fluid consistency:: Thin  Skin:  Reviewed, no issues  Last BM:  10/18/15  Height:   Ht Readings from Last 1 Encounters:  10/13/15 5' 9" (1.753 m)    Weight:   Wt Readings from Last 1 Encounters:  10/14/15 133 lb 9.6  oz (60.6 kg)    Ideal Body Weight:  72.7 kg  BMI:  Body mass index is 19.72 kg/(m^2).  Estimated Nutritional Needs:   Kcal:  1900-2100  Protein:  90-105 gm  Fluid:  2 L  EDUCATION NEEDS:   No education needs identified at this time  Kaitlan Bin A. Jimmye [facility], RD, LDN, CDE Pager: [phone] After hours Pager: [phone]

## 2015-10-20 ENCOUNTER — Encounter (HOSPITAL_COMMUNITY): Payer: Self-pay | Admitting: Respiratory Therapy

## 2015-10-20 ENCOUNTER — Encounter (HOSPITAL_COMMUNITY)
Admission: EM | Disposition: A | Discharge status: Home / Self Care | Payer: Self-pay | Source: Home / Self Care | Attending: Internal Medicine

## 2015-10-20 ENCOUNTER — Inpatient Hospital Stay (HOSPITAL_COMMUNITY): Payer: BLUE CROSS/BLUE SHIELD

## 2015-10-20 DIAGNOSIS — R911 Solitary pulmonary nodule: Secondary | ICD-10-CM

## 2015-10-20 HISTORY — PX: VIDEO BRONCHOSCOPY: SHX5072

## 2015-10-20 LAB — MISC LABCORP TEST (SEND OUT): Labcorp test code: 700353

## 2015-10-20 SURGERY — VIDEO BRONCHOSCOPY WITHOUT FLUORO
Anesthesia: Moderate Sedation | Laterality: Bilateral

## 2015-10-20 MED ORDER — FENTANYL CITRATE (PF) 100 MCG/2ML IJ SOLN
INTRAMUSCULAR | Status: AC
  Filled 2015-10-20: qty 4

## 2015-10-20 MED ORDER — BUTAMBEN-TETRACAINE-BENZOCAINE 2-2-14 % EX AERO
1.0000 | INHALATION_SPRAY | Freq: Once | CUTANEOUS | Status: DC
  Filled 2015-10-20: qty 20

## 2015-10-20 MED ORDER — LIDOCAINE HCL 1 % IJ SOLN
INTRAMUSCULAR | Status: DC | PRN
  Administered 2015-10-20: 6 mL via RESPIRATORY_TRACT

## 2015-10-20 MED ORDER — FENTANYL CITRATE (PF) 100 MCG/2ML IJ SOLN
INTRAMUSCULAR | Status: DC | PRN
  Administered 2015-10-20 (×3): 25 ug via INTRAVENOUS

## 2015-10-20 MED ORDER — LIDOCAINE HCL 2 % EX GEL
CUTANEOUS | Status: DC | PRN
  Administered 2015-10-20: 1

## 2015-10-20 MED ORDER — HYDROCOD POLST-CPM POLST ER 10-8 MG/5ML PO SUER
5.0000 mL | Freq: Two times a day (BID) | ORAL | Status: DC
  Administered 2015-10-20: 5 mL via ORAL
  Filled 2015-10-20: qty 5

## 2015-10-20 MED ORDER — MIDAZOLAM HCL 5 MG/ML IJ SOLN
INTRAMUSCULAR | Status: AC
  Filled 2015-10-20: qty 2

## 2015-10-20 MED ORDER — LIDOCAINE HCL 2 % EX GEL
1.0000 "application " | Freq: Once | CUTANEOUS | Status: DC
  Filled 2015-10-20: qty 5

## 2015-10-20 MED ORDER — MIDAZOLAM HCL 10 MG/2ML IJ SOLN
INTRAMUSCULAR | Status: DC | PRN
  Administered 2015-10-20 (×3): 1 mg via INTRAVENOUS

## 2015-10-20 MED ORDER — PHENYLEPHRINE HCL 0.25 % NA SOLN
NASAL | Status: DC | PRN
  Administered 2015-10-20: 2 via NASAL

## 2015-10-20 MED ORDER — PHENYLEPHRINE HCL 0.25 % NA SOLN
1.0000 | Freq: Four times a day (QID) | NASAL | Status: DC | PRN
  Filled 2015-10-20: qty 15

## 2015-10-20 MED ORDER — DEXTROMETHORPHAN-GUAIFENESIN 10-100 MG/5ML PO LIQD: 10.0000 mL | ORAL | Status: DC | PRN

## 2015-10-20 NOTE — Procedures (Signed)
PCCM Video Bronchoscopy Procedure Note  The patient was informed of the risks (including but not limited to bleeding, infection, respiratory failure, lung injury, tooth/oral injury) and benefits of the procedure and gave consent, see chart.  Indication: Evaluate for recurrent pulmonary aspergillosis  Post Procedure Diagnosis: H/O pulmonary aspergillosis s/p resection, ABPA  Location: Left upper lobe  Condition pre procedure: Stable  Medications for procedure: 3 mg versed, 75 mcg fentanyl  Procedure description: The bronchoscope was introduced through the nose and passed to the bilateral lungs to the level of the subsegmental bronchi throughout the tracheobronchial tree.  Airway exam revealed normal anatomy  Procedures performed: Left upper lobe BAL with 20 cc X 3  Specimens sent: Galactomannan, AFB, Fungus, regular cultures, cytology, cell count  Condition post procedure: Stable  EBL: 0cc  Complications: None.  Marshell Garfinkel MD [facility] Pager [phone] If no answer or after 3pm call: [phone] 10/20/2015, 12:06 PM

## 2015-10-20 NOTE — Progress Notes (Signed)
Video  Bronchoscopy done  Intervention Bronchial washing done  Procedure tolerated well

## 2015-10-20 NOTE — Progress Notes (Signed)
[name] to be D/C'd home per MD order. Discussed with the patient and all questions fully answered.  VSS, Skin clean, dry and intact without evidence of skin break down, no evidence of skin tears noted.  IV catheter discontinued intact. Site without signs and symptoms of complications. Dressing and pressure applied.  An After Visit Summary was printed and given to the patient. Prescription called in to patient's pharmacy.  D/c education completed with patient/family including follow up instructions, medication list, d/c activities limitations if indicated, with other d/c instructions as indicated by MD - patient able to verbalize understanding, all questions fully answered.   Patient instructed to return to ED, call 911, or call MD for any changes in condition.   Patient to be escorted via [place], and D/C home via private auto.

## 2015-10-20 NOTE — Progress Notes (Signed)
INFECTIOUS DISEASE PROGRESS NOTE  ID: [name] is a 39 y.o. male with  Principal Problem:   Hemoptysis Active Problems:   ABPA (allergic bronchopulmonary aspergillosis) (HCC)   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HCC)   Mycobacterium avium infection ([place])   MAC (mycobacterium avium-intracellulare complex)  Subjective: Without complaints.   Abtx:  Anti-infectives    Start     Dose/Rate Route Frequency Ordered Stop   10/19/15 2200  voriconazole (VFEND) tablet 300 mg     300 mg Oral Every 12 hours 10/19/15 1658     10/17/15 1430  itraconazole (SPORANOX) capsule 200 mg  Status:  Discontinued     200 mg Oral 2 times daily 10/17/15 1358 10/19/15 1657   10/14/15 1000  azithromycin (ZITHROMAX) tablet 500 mg     500 mg Oral Daily 10/14/15 0836     10/13/15 1500  ethambutol (MYAMBUTOL) tablet 1,000 mg     1,000 mg Oral Daily 10/13/15 1057     10/13/15 1000  voriconazole (VFEND) tablet 300 mg  Status:  Discontinued     300 mg Oral Every 12 hours 10/13/15 0945 10/17/15 1358   10/13/15 0800  voriconazole (VFEND) 380 mg in sodium chloride 0.9 % 100 mL IVPB  Status:  Discontinued     6 mg/kg  63 kg 69 mL/hr over 120 Minutes Intravenous Every 12 hours 10/13/15 0638 10/13/15 0641   10/13/15 0745  azithromycin (ZITHROMAX) 500 mg in dextrose 5 % 250 mL IVPB     500 mg 250 mL/hr over 60 Minutes Intravenous  Once 10/13/15 0737 10/13/15 0850   10/13/15 0743  azithromycin (ZITHROMAX) 500 MG injection    Comments:  Rolla Etienne   : cabinet override      10/13/15 0743 10/13/15 0750      Medications:  Scheduled: . arformoterol  15 mcg Nebulization BID  . azithromycin  500 mg Oral Daily  . budesonide  0.5 mg Nebulization BID  . butamben-tetracaine-benzocaine  1 spray Topical Once  . chlorpheniramine-HYDROcodone  5 mL Oral Q12H  . ethambutol  1,000 mg Oral Daily  . feeding supplement (ENSURE ENLIVE)  237 mL Oral BID BM  . ferrous sulfate  325 mg Oral Daily  . levothyroxine  50 mcg Oral QAC breakfast  .  lidocaine  1 application Topical Once  . multivitamin with minerals  1 tablet Oral Daily  . predniSONE  10 mg Oral Daily  . tiotropium  18 mcg Inhalation Daily  . voriconazole  300 mg Oral Q12H    Objective: Vital signs in last 24 hours: Temp:  [97.6 F (36.4 C)-98.5 F (36.9 C)] 97.6 F (36.4 C) (12/01 1327) Pulse Rate:  [64-106] 87 (12/01 1327) Resp:  [10-23] 18 (12/01 1327) BP: (94-153)/(66-99) 103/71 mmHg (12/01 1327) SpO2:  [90 %-100 %] 96 % (12/01 1327)   General appearance: alert, cooperative and no distress Resp: clear to auscultation bilaterally Cardio: regular rate and rhythm GI: normal findings: bowel sounds normal and soft, non-tender  Lab Results No results for input(s): WBC, HGB, HCT, NA, K, CL, CO2, BUN, CREATININE, GLU in the last 72 hours.  Invalid input(s): PLATELETS Liver Panel No results for input(s): PROT, ALBUMIN, AST, ALT, ALKPHOS, BILITOT, BILIDIR, IBILI in the last 72 hours. Sedimentation Rate No results for input(s): ESRSEDRATE in the last 72 hours. C-Reactive Protein No results for input(s): CRP in the last 72 hours.  Microbiology: Recent Results (from the past 240 hour(s))  Blood culture (routine x 2)     Status: None  Collection Time: 10/13/15  4:30 AM  Result Value Ref Range Status   Specimen Description BLOOD RIGHT ANTECUBITAL  Final   Special Requests BOTTLES DRAWN AEROBIC AND ANAEROBIC 5CC  Final   Culture   Final    NO GROWTH 5 DAYS Performed at [facility]    Report Status 10/18/2015 FINAL  Final  Blood culture (routine x 2)     Status: None   Collection Time: 10/13/15  4:35 AM  Result Value Ref Range Status   Specimen Description BLOOD BLOOD RIGHT FOREARM  Final   Special Requests BOTTLES DRAWN AEROBIC AND ANAEROBIC 5CC  Final   Culture   Final    NO GROWTH 5 DAYS Performed at [facility]    Report Status 10/18/2015 FINAL  Final  MRSA PCR Screening     Status: None   Collection Time: 10/13/15  9:15 AM    Result Value Ref Range Status   MRSA by PCR NEGATIVE NEGATIVE Final    Comment:        The GeneXpert MRSA Assay (FDA approved for NASAL specimens only), is one component of a comprehensive MRSA colonization surveillance program. It is not intended to diagnose MRSA infection nor to guide or monitor treatment for MRSA infections.   AFB culture with smear     Status: None (Preliminary result)   Collection Time: 10/13/15 12:00 PM  Result Value Ref Range Status   Specimen Description SPUTUM  Final   Special Requests Immunocompromised  Final   Acid Fast Smear   Final    NO ACID FAST BACILLI SEEN Performed at [facility]    Culture   Final    CULTURE WILL BE EXAMINED FOR 6 WEEKS BEFORE ISSUING A FINAL REPORT Performed at [facility]    Report Status PENDING  Incomplete  Culture, expectorated sputum-assessment     Status: None   Collection Time: 10/13/15 12:00 PM  Result Value Ref Range Status   Specimen Description EXPECTORATED SPUTUM  Final   Special Requests Immunocompromised  Final   Sputum evaluation   Final    THIS SPECIMEN IS ACCEPTABLE. RESPIRATORY CULTURE REPORT TO FOLLOW.   Report Status 10/13/2015 FINAL  Final  Culture, respiratory (NON-Expectorated)     Status: None   Collection Time: 10/13/15 12:00 PM  Result Value Ref Range Status   Specimen Description SPUTUM  Final   Special Requests NONE  Final   Gram Stain   Final    ABUNDANT WBC PRESENT,BOTH PMN AND MONONUCLEAR RARE SQUAMOUS EPITHELIAL CELLS PRESENT MODERATE GRAM POSITIVE COCCI IN PAIRS IN CHAINS IN CLUSTERS Performed at [facility]    Culture   Final    NORMAL OROPHARYNGEAL FLORA Performed at [facility]    Report Status 10/16/2015 FINAL  Final  Culture, expectorated sputum-assessment     Status: None   Collection Time: 10/15/15  2:26 AM  Result Value Ref Range Status   Specimen Description SPUTUM  Final   Special Requests Normal  Final   Sputum evaluation    Final    THIS SPECIMEN IS ACCEPTABLE. RESPIRATORY CULTURE REPORT TO FOLLOW.   Report Status 10/15/2015 FINAL  Final  Culture, respiratory (NON-Expectorated)     Status: None   Collection Time: 10/15/15  2:26 AM  Result Value Ref Range Status   Specimen Description EXPECTORATED SPUTUM  Final   Special Requests NONE  Final   Gram Stain   Final    RARE WBC PRESENT, PREDOMINANTLY PMN RARE  SQUAMOUS EPITHELIAL CELLS PRESENT MODERATE GRAM POSITIVE COCCI IN PAIRS AND CHAINS Performed at [facility]    Culture   Final    NORMAL OROPHARYNGEAL FLORA Performed at [facility]    Report Status 10/18/2015 FINAL  Final  AFB culture with smear     Status: None (Preliminary result)   Collection Time: 10/16/15  2:27 PM  Result Value Ref Range Status   Specimen Description SPUTUM  Final   Special Requests NONE  Final   Acid Fast Smear   Final    NO ACID FAST BACILLI SEEN Performed at [facility]    Culture   Final    CULTURE WILL BE EXAMINED FOR 6 WEEKS BEFORE ISSUING A FINAL REPORT Performed at [facility]    Report Status PENDING  Incomplete  AFB culture with smear     Status: None (Preliminary result)   Collection Time: 10/16/15  6:52 PM  Result Value Ref Range Status   Specimen Description SPUTUM  Final   Special Requests Immunocompromised  Final   Acid Fast Smear   Final    NO ACID FAST BACILLI SEEN Performed at [facility]    Culture   Final    CULTURE WILL BE EXAMINED FOR 6 WEEKS BEFORE ISSUING A FINAL REPORT Performed at [facility]    Report Status PENDING  Incomplete    Studies/Results: No results found.   Assessment/Plan: MAC LUL lesion  He has undergone bronch, his repeat specimens are pending.  He should be d/c on his previous rx- MAC and voriconazole.  He can f/u with Dr Tommy Medal in next 2-4 weeks.          Bobby Rumpf Infectious Diseases (pager) [phone] [email or web address] 10/20/2015, 1:44 PM   LOS: 7 days

## 2015-10-20 NOTE — Discharge Instructions (Signed)
Follow with Primary MD in 7 days   Get CBC, CMP, 2 view Chest X ray checked  by Primary MD next visit.    Activity: As tolerated with Full fall precautions use walker/cane & assistance as needed   Disposition Home     Diet: Heart Healthy   For Heart failure patients - Check your Weight same time everyday, if you gain over 2 pounds, or you develop in leg swelling, experience more shortness of breath or chest pain, call your Primary MD immediately. Follow Cardiac Low Salt Diet and 1.5 lit/day fluid restriction.   On your next visit with your primary care physician please Get Medicines reviewed and adjusted.   Please request your Prim.MD to go over all Hospital Tests and Procedure/Radiological results at the follow up, please get all Hospital records sent to your Prim MD by signing hospital release before you go home.   If you experience worsening of your admission symptoms, develop shortness of breath, life threatening emergency, suicidal or homicidal thoughts you must seek medical attention immediately by calling 911 or calling your MD immediately  if symptoms less severe.  You Must read complete instructions/literature along with all the possible adverse reactions/side effects for all the Medicines you take and that have been prescribed to you. Take any new Medicines after you have completely understood and accpet all the possible adverse reactions/side effects.   Do not drive, operating heavy machinery, perform activities at heights, swimming or participation in water activities or provide baby sitting services if your were admitted for syncope or siezures until you have seen by Primary MD or a Neurologist and advised to do so again.  Do not drive when taking Pain medications.    Do not take more than prescribed Pain, Sleep and Anxiety Medications  Special Instructions: If you have smoked or chewed Tobacco  in the last 2 yrs please stop smoking, stop any regular Alcohol  and or any  Recreational drug use.  Wear Seat belts while driving.   Please note  You were cared for by a hospitalist during your hospital stay. If you have any questions about your discharge medications or the care you received while you were in the hospital after you are discharged, you can call the unit and asked to speak with the hospitalist on call if the hospitalist that took care of you is not available. Once you are discharged, your primary care physician will handle any further medical issues. Please note that NO REFILLS for any discharge medications will be authorized once you are discharged, as it is imperative that you return to your primary care physician (or establish a relationship with a primary care physician if you do not have one) for your aftercare needs so that they can reassess your need for medications and monitor your lab values.

## 2015-10-20 NOTE — Discharge Summary (Signed)
[name], is a 39 y.o. male  DOB [birth year]  MRN [id number].  Admission date:  10/13/2015  Admitting Physician  Raylene Miyamoto, MD  Discharge Date:  10/20/2015   Primary MD  No PCP Per Patient  Recommendations for primary care physician for things to follow:   Must follow with pulmonary and ID closely.   Admission Diagnosis  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HCC) [B44.9] Hemoptysis [R04.2]   Discharge Diagnosis  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HCC) [B44.9] Hemoptysis [R04.2]     Principal Problem:   Hemoptysis Active Problems:   ABPA (allergic bronchopulmonary aspergillosis) (HCC)   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HCC)   Mycobacterium avium infection ([place])   MAC (mycobacterium avium-intracellulare complex)      Past Medical History  Diagnosis Date  . 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[place])   . MAI (mycobacterium avium-intracellulare) ([place])   . 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 Esophageal reflux   . Hypothyroidism     secondary to ablation for Graves disease  . Solar lentigo 08/08/2015    Past Surgical History  Procedure Laterality Date  . Left vats  2012    thoracotomy and LUL apical posterior segmentectomy       HPI  from the history and physical done on the day of admission:    39 year old Asian male with prior history of aspergilloma s/p VATS 2012-on lifelong Voriconazole,ABPA on chronic prednisone,MAC on a ethambutol/Zithromax-presented to the hospital on 11/24 with hemoptysis. CT angiogram of the chest was positive for aspergilloma in the left upper lung bulla. Felt to be high risk for pneumothorax/VDRF if bronchoscopy performed-hence IR was consulted for embolization-no further hemoptysis since then. Transferred to hospitalists service on 11/26.  Subjective:     Hospital Course:     Hemoptysis: Felt to be secondary to aspergillosis (fungal ball seen on  CT chest 11/24). Felt to be high risk for pneumothorax/VDRF if underwent bronchoscopy-hence IR consulted and patient underwent empiric embolization of bilateral bronchial arteries. No further recurrence of hemoptysis. Sputum AFB  3 negative-discontinued isolation. Switched by ID from voriconazole to itraconazole on 10-17-15 and then back to voriconazole (taking prior to admission-on lifelong voriconazole). He underwent bronchoscopy by PCCM on 10-20-15, discussed with pulmonary physician Dr. Nanetta Batty post bronchoscopy he is cleared for discharge with outpatient follow-up with ID and pulmonary.   Acute hypoxic respiratory failure: Resolved. Secondary to above, CTA chest negative for PE. Now back to baseline without any oxygen need.  History of aspergilloma status post VATS 2012: On chronic lifelong voriconazole and will be discharged on the same per ID - followed at the infectious disease clinic. He was seen by ID here and will continue to follow with closely postdischarge.  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 Reviewed outpatient ID notes (Dr. Lucianne Lei dam 08/08/15)-unfortunately patient has recurrence of symptoms when Zithromax/ethambutol discontinued. ID on board here now.  Bullous lung disease: Stable.  Hypothyroidism: Continue Synthroid  GERD: Continue PPI      Discharge Condition: Stable  Follow  UP  Follow-up Information    Follow up with PARRETT,TAMMY, NP On 11/07/2015.   Specialty:  Pulmonary Disease   Why:  Appt at 2:15 PM   Contact information:   [address] [address] [phone]       Follow up with Alcide Evener, MD. Schedule an appointment as soon as possible for a visit in 1 week.   Specialty:  Infectious Diseases   Contact information:   [address] [place] [address] [phone]        Consults obtained - pulmonary/intensive care and IR, ID  Diet and Activity recommendation: See Discharge Instructions below  Discharge Instructions       Discharge Instructions    Diet - low sodium heart healthy    Complete by:  As directed      Discharge instructions    Complete by:  As directed   Follow with Primary MD in 7 days   Get CBC, CMP, 2 view Chest X ray checked  by Primary MD next visit.    Activity: As tolerated with Full fall precautions use walker/cane & assistance as needed   Disposition Home     Diet: Heart Healthy   For Heart failure patients - Check your Weight same time everyday, if you gain over 2 pounds, or you develop in leg swelling, experience more shortness of breath or chest pain, call your Primary MD immediately. Follow Cardiac Low Salt Diet and 1.5 lit/day fluid restriction.   On your next visit with your primary care physician please Get Medicines reviewed and adjusted.   Please request your Prim.MD to go over all Hospital Tests and Procedure/Radiological results at the follow up, please get all Hospital records sent to your Prim MD by signing hospital release before you go home.   If you experience worsening of your admission symptoms, develop shortness of breath, life threatening emergency, suicidal or homicidal thoughts you must seek medical attention immediately by calling 911 or calling your MD immediately  if symptoms less severe.  You Must read complete instructions/literature along with all the possible adverse reactions/side effects for all the Medicines you take and that have been prescribed to you. Take any new Medicines after you have completely understood and accpet all the possible adverse reactions/side effects.   Do not drive, operating heavy machinery, perform activities at heights, swimming or participation in water activities or provide baby sitting services if your were  admitted for syncope or siezures until you have seen by Primary MD or a Neurologist and advised to do so again.  Do not drive when taking Pain medications.    Do not take more than prescribed Pain, Sleep and Anxiety Medications  Special Instructions: If you have smoked or chewed Tobacco  in the last 2 yrs please stop smoking, stop any regular Alcohol  and or any Recreational drug use.  Wear Seat belts while driving.   Please note  You were cared for by a hospitalist during your hospital stay. If you have any questions about your discharge medications or the care you received while you were in the hospital after you are discharged, you can call the unit and asked to speak with the hospitalist on call if the hospitalist that took care of you is not available. Once you are discharged, your primary care physician will handle any further medical issues. Please note that NO REFILLS for any discharge medications will be authorized once you are discharged,  as it is imperative that you return to your primary care physician (or establish a relationship with a primary care physician if you do not have one) for your aftercare needs so that they can reassess your need for medications and monitor your lab values.     Increase activity slowly    Complete by:  As directed              Discharge Medications       Medication List    TAKE these medications        albuterol 108 (90 BASE) MCG/ACT inhaler  Commonly known as:  PROVENTIL HFA;VENTOLIN HFA  Inhale 2 puffs into the lungs every 6 (six) hours as needed.     alendronate 70 MG tablet  Commonly known as:  FOSAMAX  Take 1 tablet (70 mg total) by mouth once a week. Take with a full glass of water on an empty stomach.     azithromycin 500 MG tablet  Commonly known as:  ZITHROMAX  TAKE ONE TABLET BY MOUTH ONCE DAILY     dextromethorphan-guaiFENesin 10-100 MG/5ML liquid  Commonly known as:  TUSSIN DM  Take 10 mLs by mouth every 4 (four)  hours as needed for cough.     ethambutol 400 MG tablet  Commonly known as:  MYAMBUTOL  TAKE TWO AND ONE-HALF TABLETS BY MOUTH ONCE DAILY     FERRO-BOB 325 (65 FE) MG tablet  Generic drug:  ferrous sulfate  Take 325 mg by mouth daily.     Fluticasone Furoate-Vilanterol 100-25 MCG/INH Aepb  Commonly known as:  BREO ELLIPTA  Take 1 puff by mouth daily.     levothyroxine 50 MCG tablet  Commonly known as:  SYNTHROID, LEVOTHROID  Take 1 tablet by mouth daily.     predniSONE 20 MG tablet  Commonly known as:  DELTASONE  Take 0.5 tablets (10 mg total) by mouth daily.     tiotropium 18 MCG inhalation capsule  Commonly known as:  SPIRIVA HANDIHALER  Place 1 capsule (18 mcg total) into inhaler and inhale daily.     voriconazole 200 MG tablet  Commonly known as:  VFEND  Take 1 tablet (200 mg total) by mouth 2 (two) times daily.     voriconazole 50 MG tablet  Commonly known as:  VFEND  Take 2 tablets (100 mg total) by mouth 2 (two) times daily. Take with the 242m bid for 3070mbid        Major procedures and Radiology Reports - PLEASE review detailed and final reports for all details, in brief -   Bronchoscopy done on 10/20/2015 by pulmonary.  11/24>>empiric embolization of bilateral bronchial arteries    Ct Angio Chest Pe W/cm &/or Wo Cm  10/13/2015  CLINICAL DATA:  Acute onset of hemoptysis, chest tightness and shortness of breath. Initial encounter. EXAM: CT ANGIOGRAPHY CHEST WITH CONTRAST TECHNIQUE: Multidetector CT imaging of the chest was performed using the standard protocol during bolus administration of intravenous contrast. Multiplanar CT image reconstructions and MIPs were obtained to evaluate the vascular anatomy. CONTRAST:  10062mMNIPAQUE IOHEXOL 350 MG/ML SOLN COMPARISON:  Chest radiograph performed earlier today at 4:25 a.m., and CT of the chest performed 03/05/2011 FINDINGS: There is no evidence of pulmonary embolus. The previously noted loculated left-sided  pneumothorax has decreased significantly in size. The large bilateral bullae are again noted. There is new dense layering airspace opacification at the large left apical bulla, extending inferiorly along the posterior aspect of the left upper  lobe, suspicious for acute fungal infection given its appearance. 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 This is in a slightly different distribution from the prior study, but appears relatively stable. There is no evidence of pleural effusion. A new small right basilar pneumothorax is noted. Vague borderline prominent aortopulmonary window and subcarinal nodes are suggested, measuring up to 1.1 cm in short axis. The mediastinum is otherwise unremarkable. No pericardial effusion is identified. The great vessels are grossly unremarkable. No axillary lymphadenopathy is seen. The thyroid gland is unremarkable in appearance. The visualized portions of the liver and spleen are unremarkable. The visualized portions of the pancreas, stomach, adrenal glands and left kidney are within normal limits. No acute osseous abnormalities are seen. Review of the MIP images confirms the above findings. IMPRESSION: 1. No evidence of pulmonary embolus. 2. New dense layering airspace opacification at the patient's large left apical bulla, extending inferiorly along the posterior aspect of the left upper lobe. This is suspicious for acute fungal infection (i.e., a fungal ball), given its appearance. 3. 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 4. Previously noted loculated left-sided pneumothorax has decreased significantly in size. 5. New small right basilar pneumothorax noted. 6. Underlying large bilateral bullae again noted. 7. Suggestion of mildly prominent mediastinal nodes. These results were called by telephone at the time of  interpretation on 10/13/2015 at 6:05 am to Dr. Pryor Curia, who verbally acknowledged these results. Electronically Signed   By: Garald Balding M.D.   On: 10/13/2015 06:05   Ir Aorta/thoracic  10/13/2015  CLINICAL DATA:  39 year old male with a history of chronic pulmonary infection with MAC, as well as prior resection for Aspergilloma. He has developed hemoptysis, with significant bleeding on the prior night. Bronchoscopy could not be performed because of concerns for the patient's pulmonary status. Bilateral empiric embolization is planned. EXAM: ULTRASOUND GUIDED ACCESS RIGHT COMMON FEMORAL ARTERY THORACIC AORTIC ANGIOGRAM SELECTIVE ANGIOGRAM OF MULTIPLE BILATERAL THORACIC SEGMENTAL VESSELS INCLUDING BILATERAL BRONCHIAL ARTERIES. 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 Date:  11/24/201611/24/2016 3:52 pm FLUOROSCOPY TIME:  22 minutes, 24 seconds MEDICATIONS AND MEDICAL HISTORY: 1.0 mg Versed, 50 mcg fentanyl ANESTHESIA/SEDATION: 73 minutes CONTRAST:  231m OMNIPAQUE IOHEXOL 300 MG/ML  SOLN COMPLICATIONS: None PROCEDURE: Informed consent was obtained from the patient following explanation of the procedure, risks, benefits and alternatives. Specific risks include bleeding, infection, arterial injury, need for further surgery or procedure, kidney injury, contrast reaction, non targeted embolization, neurologic injury/ deficit, cardiopulmonary collapse, death The patient understands, agrees and consents for the procedure. All questions were addressed. A time out was performed. Patient is position in the supine position on the fluoroscopy table. The right inguinal region was prepped and draped in the usual sterile fashion. Maximum sterile protection was used including gown and gloves mask. 1% lidocaine was used for local anesthesia. Ultrasound survey of the right inguinal region was  performed with images stored and sent to PACs. A micropuncture needle was used access the right common femoral artery under ultrasound. With excellent arterial blood flow returned, and an .018 micro wire was passed through the needle, observed enter the abdominal aorta under fluoroscopy. The needle was removed, and a micropuncture sheath was placed over the wire. The inner dilator and wire were removed, and an 035 Bentson wire was advanced under fluoroscopy into the abdominal aorta.  The sheath was removed and a standard 5 [place] vascular sheath was placed. The dilator was removed and the sheath was flushed. Bentson wire was advanced to the aortic arch, and a pigtail catheter was passed over the wire to the aortic arch. Thoracic aortic angiogram was performed. Pigtail catheter was exchanged for a Mickelson catheter which was used to select multiple segmental vessels of the thoracic arch. Angiogram was performed of each vessel selected. Once the right bronchial artery was engaged, angiogram was performed. Micro catheter system including a high-flow Renegade catheter was passed into the right bronchial artery, at a safe distance from the origin to avoid refluxing into the aorta. Empiric embolization was then performed with 500 - 700 micro meter embospheres. One vial was used. Micro catheter system was then removed, and a final angiogram was performed through the base catheter. Mickelson catheter was then used to select multiple segmental vessels searching for left bronchial artery. Exchange was made over the Bentson wire for a shunt catheter. The Chung catheter was successful with engaging the origin of left bronchial artery. Angiogram performed. Micro catheter system was then advanced into the left bronchial artery for empiric embolization. Five hundred - 700 micro meter embospheres were used. One vial used. Repeat angiogram was performed. Angiogram of the right common femoral artery was performed. Exoseal was  deployed. Patient tolerated the procedure well and remained hemodynamically stable throughout. No complications were encountered and no significant blood loss encountered. FINDINGS: Thoracic aorta demonstrates no dissection flap or aneurysm. No significant atherosclerotic disease along the lumen of the aorta. Multiple segmental vessels identified including 2 right bronchial artery and single left bronchial artery from the aortic angiogram. Superior right bronchial artery angiogram: Tortuous vasculature without stenosis at the origin. Vessel is of large caliber and supplies a cluster of abnormal vessels at the apex of the lung. Inflammatory tissue at the lung apex a demonstrates dense enhancement with no early draining vein identified. No evidence of arterial contribution towards the spinal canal. Status post microsphere embolization of the right superior bronchial artery there is no significant residual blood flow. Angiogram right supreme intercostal artery angiogram: No contribution to abnormal lung tissue. Angiogram right superior intercostal angiogram: No contribution to the lung apex. Left T9 segmental vessel angiogram: No contribution to the abnormal lung tissue. No reticular medullary vessel identified. Right inferior bronchial artery angiogram: No contribution to the abnormal vasculature at the right apex. Left T8 segmental vessel angiogram: No contribution to abnormal left-sided lung tissue. Left bronchial artery angiogram: Circuitous vasculature with significant contribution to abnormal lung tissue at the left apex. There is dense opacification of inflammatory tissue at the left apex. No extravasation is identified. No contribution of vasculature directed towards the spinal canal. Status post empiric embolization with 500-700 embospheres, no significant contribution of the left bronchial artery to the left apex. IMPRESSION: Status post thoracic aortic and segmental vessel angiogram with empiric embolization  of left bronchial artery and superior right bronchial artery, each contributing significant blood flow to abnormal enhancing inflammatory tissue at the left and right apex, respectively. Both artery were embolized to stasis, with no significant opacification of abnormal tissue at the completion. Status post Exoseal deployment. Signed, Dulcy Fanny. Earleen Newport, DO [facility] Electronically Signed   By: Corrie Mckusick D.O.   On: 10/13/2015 19:21   Ir Angiogram Selective Each Additional Vessel  10/13/2015  CLINICAL DATA:  40 year old male with a history of chronic pulmonary infection with MAC, as well as prior resection  for Aspergilloma. He has developed hemoptysis, with significant bleeding on the prior night. Bronchoscopy could not be performed because of concerns for the patient's pulmonary status. Bilateral empiric embolization is planned. EXAM: ULTRASOUND GUIDED ACCESS RIGHT COMMON FEMORAL ARTERY THORACIC AORTIC ANGIOGRAM SELECTIVE ANGIOGRAM OF MULTIPLE BILATERAL THORACIC SEGMENTAL VESSELS INCLUDING BILATERAL BRONCHIAL ARTERIES. 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 Date:  11/24/201611/24/2016 3:52 pm FLUOROSCOPY TIME:  22 minutes, 24 seconds MEDICATIONS AND MEDICAL HISTORY: 1.0 mg Versed, 50 mcg fentanyl ANESTHESIA/SEDATION: 73 minutes CONTRAST:  279m OMNIPAQUE IOHEXOL 300 MG/ML  SOLN COMPLICATIONS: None PROCEDURE: Informed consent was obtained from the patient following explanation of the procedure, risks, benefits and alternatives. Specific risks include bleeding, infection, arterial injury, need for further surgery or procedure, kidney injury, contrast reaction, non targeted embolization, neurologic injury/ deficit, cardiopulmonary collapse, death The patient understands, agrees and consents for the procedure. All questions  were addressed. A time out was performed. Patient is position in the supine position on the fluoroscopy table. The right inguinal region was prepped and draped in the usual sterile fashion. Maximum sterile protection was used including gown and gloves mask. 1% lidocaine was used for local anesthesia. Ultrasound survey of the right inguinal region was performed with images stored and sent to PACs. A micropuncture needle was used access the right common femoral artery under ultrasound. With excellent arterial blood flow returned, and an .018 micro wire was passed through the needle, observed enter the abdominal aorta under fluoroscopy. The needle was removed, and a micropuncture sheath was placed over the wire. The inner dilator and wire were removed, and an 035 Bentson wire was advanced under fluoroscopy into the abdominal aorta. The sheath was removed and a standard 5 [place] sheath was placed. The dilator was removed and the sheath was flushed. Bentson wire was advanced to the aortic arch, and a pigtail catheter was passed over the wire to the aortic arch. Thoracic aortic angiogram was performed. Pigtail catheter was exchanged for a Mickelson catheter which was used to select multiple segmental vessels of the thoracic arch. Angiogram was performed of each vessel selected. Once the right bronchial artery was engaged, angiogram was performed. Micro catheter system including a high-flow Renegade catheter was passed into the right bronchial artery, at a safe distance from the origin to avoid refluxing into the aorta. Empiric embolization was then performed with 500 - 700 micro meter embospheres. One vial was used. Micro catheter system was then removed, and a final angiogram was performed through the base catheter. Mickelson catheter was then used to select multiple segmental vessels searching for left bronchial artery. Exchange was made over the Bentson wire for a shunt catheter. The Chung catheter was  successful with engaging the origin of left bronchial artery. Angiogram performed. Micro catheter system was then advanced into the left bronchial artery for empiric embolization. Five hundred - 700 micro meter embospheres were used. One vial used. Repeat angiogram was performed. Angiogram of the right common femoral artery was performed. Exoseal was deployed. Patient tolerated the procedure well and remained hemodynamically stable throughout. No complications were encountered and no significant blood loss encountered. FINDINGS: Thoracic aorta demonstrates no dissection flap or aneurysm. No significant atherosclerotic disease along the lumen of the aorta. Multiple segmental vessels identified including 2 right bronchial artery and single left bronchial artery from the aortic angiogram. Superior right bronchial artery angiogram: Tortuous vasculature without stenosis at the origin. Vessel  is of large caliber and supplies a cluster of abnormal vessels at the apex of the lung. Inflammatory tissue at the lung apex a demonstrates dense enhancement with no early draining vein identified. No evidence of arterial contribution towards the spinal canal. Status post microsphere embolization of the right superior bronchial artery there is no significant residual blood flow. Angiogram right supreme intercostal artery angiogram: No contribution to abnormal lung tissue. Angiogram right superior intercostal angiogram: No contribution to the lung apex. Left T9 segmental vessel angiogram: No contribution to the abnormal lung tissue. No reticular medullary vessel identified. Right inferior bronchial artery angiogram: No contribution to the abnormal vasculature at the right apex. Left T8 segmental vessel angiogram: No contribution to abnormal left-sided lung tissue. Left bronchial artery angiogram: Circuitous vasculature with significant contribution to abnormal lung tissue at the left apex. There is dense opacification of inflammatory  tissue at the left apex. No extravasation is identified. No contribution of vasculature directed towards the spinal canal. Status post empiric embolization with 500-700 embospheres, no significant contribution of the left bronchial artery to the left apex. IMPRESSION: Status post thoracic aortic and segmental vessel angiogram with empiric embolization of left bronchial artery and superior right bronchial artery, each contributing significant blood flow to abnormal enhancing inflammatory tissue at the left and right apex, respectively. Both artery were embolized to stasis, with no significant opacification of abnormal tissue at the completion. Status post Exoseal deployment. Signed, Dulcy Fanny. Earleen Newport, DO [facility] Electronically Signed   By: Corrie Mckusick D.O.   On: 10/13/2015 19:21   Ir Angiogram Selective Each Additional Vessel  10/13/2015  CLINICAL DATA:  39 year old male with a history of chronic pulmonary infection with MAC, as well as prior resection for Aspergilloma. He has developed hemoptysis, with significant bleeding on the prior night. Bronchoscopy could not be performed because of concerns for the patient's pulmonary status. Bilateral empiric embolization is planned. EXAM: ULTRASOUND GUIDED ACCESS RIGHT COMMON FEMORAL ARTERY THORACIC AORTIC ANGIOGRAM SELECTIVE ANGIOGRAM OF MULTIPLE BILATERAL THORACIC SEGMENTAL VESSELS INCLUDING BILATERAL BRONCHIAL ARTERIES. 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 Date:  11/24/201611/24/2016 3:52 pm FLUOROSCOPY TIME:  22 minutes, 24 seconds MEDICATIONS AND MEDICAL HISTORY: 1.0 mg Versed, 50 mcg fentanyl ANESTHESIA/SEDATION: 73 minutes CONTRAST:  259m OMNIPAQUE IOHEXOL 300 MG/ML  SOLN COMPLICATIONS: None PROCEDURE: Informed consent was obtained from the patient following  explanation of the procedure, risks, benefits and alternatives. Specific risks include bleeding, infection, arterial injury, need for further surgery or procedure, kidney injury, contrast reaction, non targeted embolization, neurologic injury/ deficit, cardiopulmonary collapse, death The patient understands, agrees and consents for the procedure. All questions were addressed. A time out was performed. Patient is position in the supine position on the fluoroscopy table. The right inguinal region was prepped and draped in the usual sterile fashion. Maximum sterile protection was used including gown and gloves mask. 1% lidocaine was used for local anesthesia. Ultrasound survey of the right inguinal region was performed with images stored and sent to PACs. A micropuncture needle was used access the right common femoral artery under ultrasound. With excellent arterial blood flow returned, and an .018 micro wire was passed through the needle, observed enter the abdominal aorta under fluoroscopy. The needle was removed, and a micropuncture sheath was placed over the wire. The inner dilator and wire were removed, and an 035 Bentson wire was advanced under fluoroscopy into the  abdominal aorta. The sheath was removed and a standard 5 [place] vascular sheath was placed. The dilator was removed and the sheath was flushed. Bentson wire was advanced to the aortic arch, and a pigtail catheter was passed over the wire to the aortic arch. Thoracic aortic angiogram was performed. Pigtail catheter was exchanged for a Mickelson catheter which was used to select multiple segmental vessels of the thoracic arch. Angiogram was performed of each vessel selected. Once the right bronchial artery was engaged, angiogram was performed. Micro catheter system including a high-flow Renegade catheter was passed into the right bronchial artery, at a safe distance from the origin to avoid refluxing into the aorta. Empiric embolization was then  performed with 500 - 700 micro meter embospheres. One vial was used. Micro catheter system was then removed, and a final angiogram was performed through the base catheter. Mickelson catheter was then used to select multiple segmental vessels searching for left bronchial artery. Exchange was made over the Bentson wire for a shunt catheter. The Chung catheter was successful with engaging the origin of left bronchial artery. Angiogram performed. Micro catheter system was then advanced into the left bronchial artery for empiric embolization. Five hundred - 700 micro meter embospheres were used. One vial used. Repeat angiogram was performed. Angiogram of the right common femoral artery was performed. Exoseal was deployed. Patient tolerated the procedure well and remained hemodynamically stable throughout. No complications were encountered and no significant blood loss encountered. FINDINGS: Thoracic aorta demonstrates no dissection flap or aneurysm. No significant atherosclerotic disease along the lumen of the aorta. Multiple segmental vessels identified including 2 right bronchial artery and single left bronchial artery from the aortic angiogram. Superior right bronchial artery angiogram: Tortuous vasculature without stenosis at the origin. Vessel is of large caliber and supplies a cluster of abnormal vessels at the apex of the lung. Inflammatory tissue at the lung apex a demonstrates dense enhancement with no early draining vein identified. No evidence of arterial contribution towards the spinal canal. Status post microsphere embolization of the right superior bronchial artery there is no significant residual blood flow. Angiogram right supreme intercostal artery angiogram: No contribution to abnormal lung tissue. Angiogram right superior intercostal angiogram: No contribution to the lung apex. Left T9 segmental vessel angiogram: No contribution to the abnormal lung tissue. No reticular medullary vessel identified.  Right inferior bronchial artery angiogram: No contribution to the abnormal vasculature at the right apex. Left T8 segmental vessel angiogram: No contribution to abnormal left-sided lung tissue. Left bronchial artery angiogram: Circuitous vasculature with significant contribution to abnormal lung tissue at the left apex. There is dense opacification of inflammatory tissue at the left apex. No extravasation is identified. No contribution of vasculature directed towards the spinal canal. Status post empiric embolization with 500-700 embospheres, no significant contribution of the left bronchial artery to the left apex. IMPRESSION: Status post thoracic aortic and segmental vessel angiogram with empiric embolization of left bronchial artery and superior right bronchial artery, each contributing significant blood flow to abnormal enhancing inflammatory tissue at the left and right apex, respectively. Both artery were embolized to stasis, with no significant opacification of abnormal tissue at the completion. Status post Exoseal deployment. Signed, Dulcy Fanny. Earleen Newport, DO [facility] Radiology Electronically Signed   By: Corrie Mckusick D.O.   On: 10/13/2015 19:21   Ir Angiogram Selective Each Additional Vessel  10/13/2015  CLINICAL DATA:  39 year old male with a history of chronic pulmonary infection with MAC, as well as  prior resection for Aspergilloma. He has developed hemoptysis, with significant bleeding on the prior night. Bronchoscopy could not be performed because of concerns for the patient's pulmonary status. Bilateral empiric embolization is planned. EXAM: ULTRASOUND GUIDED ACCESS RIGHT COMMON FEMORAL ARTERY THORACIC AORTIC ANGIOGRAM SELECTIVE ANGIOGRAM OF MULTIPLE BILATERAL THORACIC SEGMENTAL VESSELS INCLUDING BILATERAL BRONCHIAL ARTERIES. EMPIRIC EMBOLIZATION WITH 500-700 MICRO METER EMBOSPHERES OF BILATERAL BRONCHIAL ARTERIES CONTRIBUTING TO ABNORMAL LUNG  PARENCHYMA AS THE MOST LIKELY SOURCE OF HEMOPTYSIS. Date:  11/24/201611/24/2016 3:52 pm FLUOROSCOPY TIME:  22 minutes, 24 seconds MEDICATIONS AND MEDICAL HISTORY: 1.0 mg Versed, 50 mcg fentanyl ANESTHESIA/SEDATION: 73 minutes CONTRAST:  228m OMNIPAQUE IOHEXOL 300 MG/ML  SOLN COMPLICATIONS: None PROCEDURE: Informed consent was obtained from the patient following explanation of the procedure, risks, benefits and alternatives. Specific risks include bleeding, infection, arterial injury, need for further surgery or procedure, kidney injury, contrast reaction, non targeted embolization, neurologic injury/ deficit, cardiopulmonary collapse, death The patient understands, agrees and consents for the procedure. All questions were addressed. A time out was performed. Patient is position in the supine position on the fluoroscopy table. The right inguinal region was prepped and draped in the usual sterile fashion. Maximum sterile protection was used including gown and gloves mask. 1% lidocaine was used for local anesthesia. Ultrasound survey of the right inguinal region was performed with images stored and sent to PACs. A micropuncture needle was used access the right common femoral artery under ultrasound. With excellent arterial blood flow returned, and an .018 micro wire was passed through the needle, observed enter the abdominal aorta under fluoroscopy. The needle was removed, and a micropuncture sheath was placed over the wire. The inner dilator and wire were removed, and an 035 Bentson wire was advanced under fluoroscopy into the abdominal aorta. The sheath was removed and a standard 5 [place] sheath was placed. The dilator was removed and the sheath was flushed. Bentson wire was advanced to the aortic arch, and a pigtail catheter was passed over the wire to the aortic arch. Thoracic aortic angiogram was performed. Pigtail catheter was exchanged for a Mickelson catheter which was used to select multiple  segmental vessels of the thoracic arch. Angiogram was performed of each vessel selected. Once the right bronchial artery was engaged, angiogram was performed. Micro catheter system including a high-flow Renegade catheter was passed into the right bronchial artery, at a safe distance from the origin to avoid refluxing into the aorta. Empiric embolization was then performed with 500 - 700 micro meter embospheres. One vial was used. Micro catheter system was then removed, and a final angiogram was performed through the base catheter. Mickelson catheter was then used to select multiple segmental vessels searching for left bronchial artery. Exchange was made over the Bentson wire for a shunt catheter. The Chung catheter was successful with engaging the origin of left bronchial artery. Angiogram performed. Micro catheter system was then advanced into the left bronchial artery for empiric embolization. Five hundred - 700 micro meter embospheres were used. One vial used. Repeat angiogram was performed. Angiogram of the right common femoral artery was performed. Exoseal was deployed. Patient tolerated the procedure well and remained hemodynamically stable throughout. No complications were encountered and no significant blood loss encountered. FINDINGS: Thoracic aorta demonstrates no dissection flap or aneurysm. No significant atherosclerotic disease along the lumen of the aorta. Multiple segmental vessels identified including 2 right bronchial artery and single left bronchial artery from the aortic angiogram. Superior right bronchial artery angiogram: Tortuous vasculature without stenosis at the  origin. Vessel is of large caliber and supplies a cluster of abnormal vessels at the apex of the lung. Inflammatory tissue at the lung apex a demonstrates dense enhancement with no early draining vein identified. No evidence of arterial contribution towards the spinal canal. Status post microsphere embolization of the right superior  bronchial artery there is no significant residual blood flow. Angiogram right supreme intercostal artery angiogram: No contribution to abnormal lung tissue. Angiogram right superior intercostal angiogram: No contribution to the lung apex. Left T9 segmental vessel angiogram: No contribution to the abnormal lung tissue. No reticular medullary vessel identified. Right inferior bronchial artery angiogram: No contribution to the abnormal vasculature at the right apex. Left T8 segmental vessel angiogram: No contribution to abnormal left-sided lung tissue. Left bronchial artery angiogram: Circuitous vasculature with significant contribution to abnormal lung tissue at the left apex. There is dense opacification of inflammatory tissue at the left apex. No extravasation is identified. No contribution of vasculature directed towards the spinal canal. Status post empiric embolization with 500-700 embospheres, no significant contribution of the left bronchial artery to the left apex. IMPRESSION: Status post thoracic aortic and segmental vessel angiogram with empiric embolization of left bronchial artery and superior right bronchial artery, each contributing significant blood flow to abnormal enhancing inflammatory tissue at the left and right apex, respectively. Both artery were embolized to stasis, with no significant opacification of abnormal tissue at the completion. Status post Exoseal deployment. Signed, Dulcy Fanny. Earleen Newport, DO [facility] Radiology Electronically Signed   By: Corrie Mckusick D.O.   On: 10/13/2015 19:21   Ir Angiogram Selective Each Additional Vessel  10/13/2015  CLINICAL DATA:  39 year old male with a history of chronic pulmonary infection with MAC, as well as prior resection for Aspergilloma. He has developed hemoptysis, with significant bleeding on the prior night. Bronchoscopy could not be performed because of concerns for the patient's pulmonary status.  Bilateral empiric embolization is planned. EXAM: ULTRASOUND GUIDED ACCESS RIGHT COMMON FEMORAL ARTERY THORACIC AORTIC ANGIOGRAM SELECTIVE ANGIOGRAM OF MULTIPLE BILATERAL THORACIC SEGMENTAL VESSELS INCLUDING BILATERAL BRONCHIAL ARTERIES. 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 Date:  11/24/201611/24/2016 3:52 pm FLUOROSCOPY TIME:  22 minutes, 24 seconds MEDICATIONS AND MEDICAL HISTORY: 1.0 mg Versed, 50 mcg fentanyl ANESTHESIA/SEDATION: 73 minutes CONTRAST:  2103m OMNIPAQUE IOHEXOL 300 MG/ML  SOLN COMPLICATIONS: None PROCEDURE: Informed consent was obtained from the patient following explanation of the procedure, risks, benefits and alternatives. Specific risks include bleeding, infection, arterial injury, need for further surgery or procedure, kidney injury, contrast reaction, non targeted embolization, neurologic injury/ deficit, cardiopulmonary collapse, death The patient understands, agrees and consents for the procedure. All questions were addressed. A time out was performed. Patient is position in the supine position on the fluoroscopy table. The right inguinal region was prepped and draped in the usual sterile fashion. Maximum sterile protection was used including gown and gloves mask. 1% lidocaine was used for local anesthesia. Ultrasound survey of the right inguinal region was performed with images stored and sent to PACs. A micropuncture needle was used access the right common femoral artery under ultrasound. With excellent arterial blood flow returned, and an .018 micro wire was passed through the needle, observed enter the abdominal aorta under fluoroscopy. The needle was removed, and a micropuncture sheath was placed over the wire. The inner dilator and wire were removed, and an 035 Bentson wire was advanced under fluoroscopy  into the abdominal aorta. The sheath was removed  and a standard 5 [place] vascular sheath was placed. The dilator was removed and the sheath was flushed. Bentson wire was advanced to the aortic arch, and a pigtail catheter was passed over the wire to the aortic arch. Thoracic aortic angiogram was performed. Pigtail catheter was exchanged for a Mickelson catheter which was used to select multiple segmental vessels of the thoracic arch. Angiogram was performed of each vessel selected. Once the right bronchial artery was engaged, angiogram was performed. Micro catheter system including a high-flow Renegade catheter was passed into the right bronchial artery, at a safe distance from the origin to avoid refluxing into the aorta. Empiric embolization was then performed with 500 - 700 micro meter embospheres. One vial was used. Micro catheter system was then removed, and a final angiogram was performed through the base catheter. Mickelson catheter was then used to select multiple segmental vessels searching for left bronchial artery. Exchange was made over the Bentson wire for a shunt catheter. The Chung catheter was successful with engaging the origin of left bronchial artery. Angiogram performed. Micro catheter system was then advanced into the left bronchial artery for empiric embolization. Five hundred - 700 micro meter embospheres were used. One vial used. Repeat angiogram was performed. Angiogram of the right common femoral artery was performed. Exoseal was deployed. Patient tolerated the procedure well and remained hemodynamically stable throughout. No complications were encountered and no significant blood loss encountered. FINDINGS: Thoracic aorta demonstrates no dissection flap or aneurysm. No significant atherosclerotic disease along the lumen of the aorta. Multiple segmental vessels identified including 2 right bronchial artery and single left bronchial artery from the aortic angiogram. Superior right bronchial artery angiogram: Tortuous vasculature without  stenosis at the origin. Vessel is of large caliber and supplies a cluster of abnormal vessels at the apex of the lung. Inflammatory tissue at the lung apex a demonstrates dense enhancement with no early draining vein identified. No evidence of arterial contribution towards the spinal canal. Status post microsphere embolization of the right superior bronchial artery there is no significant residual blood flow. Angiogram right supreme intercostal artery angiogram: No contribution to abnormal lung tissue. Angiogram right superior intercostal angiogram: No contribution to the lung apex. Left T9 segmental vessel angiogram: No contribution to the abnormal lung tissue. No reticular medullary vessel identified. Right inferior bronchial artery angiogram: No contribution to the abnormal vasculature at the right apex. Left T8 segmental vessel angiogram: No contribution to abnormal left-sided lung tissue. Left bronchial artery angiogram: Circuitous vasculature with significant contribution to abnormal lung tissue at the left apex. There is dense opacification of inflammatory tissue at the left apex. No extravasation is identified. No contribution of vasculature directed towards the spinal canal. Status post empiric embolization with 500-700 embospheres, no significant contribution of the left bronchial artery to the left apex. IMPRESSION: Status post thoracic aortic and segmental vessel angiogram with empiric embolization of left bronchial artery and superior right bronchial artery, each contributing significant blood flow to abnormal enhancing inflammatory tissue at the left and right apex, respectively. Both artery were embolized to stasis, with no significant opacification of abnormal tissue at the completion. Status post Exoseal deployment. Signed, Dulcy Fanny. Earleen Newport, DO [facility] Radiology Electronically Signed   By: Corrie Mckusick D.O.   On: 10/13/2015 19:21   Ir Angiogram  Selective Each Additional Vessel  10/13/2015  CLINICAL DATA:  39 year old male with a history of chronic pulmonary infection with MAC,  as well as prior resection for Aspergilloma. He has developed hemoptysis, with significant bleeding on the prior night. Bronchoscopy could not be performed because of concerns for the patient's pulmonary status. Bilateral empiric embolization is planned. EXAM: ULTRASOUND GUIDED ACCESS RIGHT COMMON FEMORAL ARTERY THORACIC AORTIC ANGIOGRAM SELECTIVE ANGIOGRAM OF MULTIPLE BILATERAL THORACIC SEGMENTAL VESSELS INCLUDING BILATERAL BRONCHIAL ARTERIES. 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 Date:  11/24/201611/24/2016 3:52 pm FLUOROSCOPY TIME:  22 minutes, 24 seconds MEDICATIONS AND MEDICAL HISTORY: 1.0 mg Versed, 50 mcg fentanyl ANESTHESIA/SEDATION: 73 minutes CONTRAST:  243m OMNIPAQUE IOHEXOL 300 MG/ML  SOLN COMPLICATIONS: None PROCEDURE: Informed consent was obtained from the patient following explanation of the procedure, risks, benefits and alternatives. Specific risks include bleeding, infection, arterial injury, need for further surgery or procedure, kidney injury, contrast reaction, non targeted embolization, neurologic injury/ deficit, cardiopulmonary collapse, death The patient understands, agrees and consents for the procedure. All questions were addressed. A time out was performed. Patient is position in the supine position on the fluoroscopy table. The right inguinal region was prepped and draped in the usual sterile fashion. Maximum sterile protection was used including gown and gloves mask. 1% lidocaine was used for local anesthesia. Ultrasound survey of the right inguinal region was performed with images stored and sent to PACs. A micropuncture needle was used access the right common femoral artery under ultrasound. With excellent arterial  blood flow returned, and an .018 micro wire was passed through the needle, observed enter the abdominal aorta under fluoroscopy. The needle was removed, and a micropuncture sheath was placed over the wire. The inner dilator and wire were removed, and an 035 Bentson wire was advanced under fluoroscopy into the abdominal aorta. The sheath was removed and a standard 5 [place] sheath was placed. The dilator was removed and the sheath was flushed. Bentson wire was advanced to the aortic arch, and a pigtail catheter was passed over the wire to the aortic arch. Thoracic aortic angiogram was performed. Pigtail catheter was exchanged for a Mickelson catheter which was used to select multiple segmental vessels of the thoracic arch. Angiogram was performed of each vessel selected. Once the right bronchial artery was engaged, angiogram was performed. Micro catheter system including a high-flow Renegade catheter was passed into the right bronchial artery, at a safe distance from the origin to avoid refluxing into the aorta. Empiric embolization was then performed with 500 - 700 micro meter embospheres. One vial was used. Micro catheter system was then removed, and a final angiogram was performed through the base catheter. Mickelson catheter was then used to select multiple segmental vessels searching for left bronchial artery. Exchange was made over the Bentson wire for a shunt catheter. The Chung catheter was successful with engaging the origin of left bronchial artery. Angiogram performed. Micro catheter system was then advanced into the left bronchial artery for empiric embolization. Five hundred - 700 micro meter embospheres were used. One vial used. Repeat angiogram was performed. Angiogram of the right common femoral artery was performed. Exoseal was deployed. Patient tolerated the procedure well and remained hemodynamically stable throughout. No complications were encountered and no significant blood loss  encountered. FINDINGS: Thoracic aorta demonstrates no dissection flap or aneurysm. No significant atherosclerotic disease along the lumen of the aorta. Multiple segmental vessels identified including 2 right bronchial artery and single left bronchial artery from the aortic angiogram. Superior right bronchial artery angiogram: Tortuous vasculature without  stenosis at the origin. Vessel is of large caliber and supplies a cluster of abnormal vessels at the apex of the lung. Inflammatory tissue at the lung apex a demonstrates dense enhancement with no early draining vein identified. No evidence of arterial contribution towards the spinal canal. Status post microsphere embolization of the right superior bronchial artery there is no significant residual blood flow. Angiogram right supreme intercostal artery angiogram: No contribution to abnormal lung tissue. Angiogram right superior intercostal angiogram: No contribution to the lung apex. Left T9 segmental vessel angiogram: No contribution to the abnormal lung tissue. No reticular medullary vessel identified. Right inferior bronchial artery angiogram: No contribution to the abnormal vasculature at the right apex. Left T8 segmental vessel angiogram: No contribution to abnormal left-sided lung tissue. Left bronchial artery angiogram: Circuitous vasculature with significant contribution to abnormal lung tissue at the left apex. There is dense opacification of inflammatory tissue at the left apex. No extravasation is identified. No contribution of vasculature directed towards the spinal canal. Status post empiric embolization with 500-700 embospheres, no significant contribution of the left bronchial artery to the left apex. IMPRESSION: Status post thoracic aortic and segmental vessel angiogram with empiric embolization of left bronchial artery and superior right bronchial artery, each contributing significant blood flow to abnormal enhancing inflammatory tissue at the left  and right apex, respectively. Both artery were embolized to stasis, with no significant opacification of abnormal tissue at the completion. Status post Exoseal deployment. Signed, Dulcy Fanny. Earleen Newport, DO [facility] Radiology Electronically Signed   By: Corrie Mckusick D.O.   On: 10/13/2015 19:21   Ir Angiogram Selective Each Additional Vessel  10/13/2015  CLINICAL DATA:  39 year old male with a history of chronic pulmonary infection with MAC, as well as prior resection for Aspergilloma. He has developed hemoptysis, with significant bleeding on the prior night. Bronchoscopy could not be performed because of concerns for the patient's pulmonary status. Bilateral empiric embolization is planned. EXAM: ULTRASOUND GUIDED ACCESS RIGHT COMMON FEMORAL ARTERY THORACIC AORTIC ANGIOGRAM SELECTIVE ANGIOGRAM OF MULTIPLE BILATERAL THORACIC SEGMENTAL VESSELS INCLUDING BILATERAL BRONCHIAL ARTERIES. 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 Date:  11/24/201611/24/2016 3:52 pm FLUOROSCOPY TIME:  22 minutes, 24 seconds MEDICATIONS AND MEDICAL HISTORY: 1.0 mg Versed, 50 mcg fentanyl ANESTHESIA/SEDATION: 73 minutes CONTRAST:  265m OMNIPAQUE IOHEXOL 300 MG/ML  SOLN COMPLICATIONS: None PROCEDURE: Informed consent was obtained from the patient following explanation of the procedure, risks, benefits and alternatives. Specific risks include bleeding, infection, arterial injury, need for further surgery or procedure, kidney injury, contrast reaction, non targeted embolization, neurologic injury/ deficit, cardiopulmonary collapse, death The patient understands, agrees and consents for the procedure. All questions were addressed. A time out was performed. Patient is position in the supine position on the fluoroscopy table. The right inguinal region was prepped and  draped in the usual sterile fashion. Maximum sterile protection was used including gown and gloves mask. 1% lidocaine was used for local anesthesia. Ultrasound survey of the right inguinal region was performed with images stored and sent to PACs. A micropuncture needle was used access the right common femoral artery under ultrasound. With excellent arterial blood flow returned, and an .018 micro wire was passed through the needle, observed enter the abdominal aorta under fluoroscopy. The needle was removed, and a micropuncture sheath was placed over the wire. The inner dilator and wire were removed, and an 035 Bentson wire was  advanced under fluoroscopy into the abdominal aorta. The sheath was removed and a standard 5 [place] vascular sheath was placed. The dilator was removed and the sheath was flushed. Bentson wire was advanced to the aortic arch, and a pigtail catheter was passed over the wire to the aortic arch. Thoracic aortic angiogram was performed. Pigtail catheter was exchanged for a Mickelson catheter which was used to select multiple segmental vessels of the thoracic arch. Angiogram was performed of each vessel selected. Once the right bronchial artery was engaged, angiogram was performed. Micro catheter system including a high-flow Renegade catheter was passed into the right bronchial artery, at a safe distance from the origin to avoid refluxing into the aorta. Empiric embolization was then performed with 500 - 700 micro meter embospheres. One vial was used. Micro catheter system was then removed, and a final angiogram was performed through the base catheter. Mickelson catheter was then used to select multiple segmental vessels searching for left bronchial artery. Exchange was made over the Bentson wire for a shunt catheter. The Chung catheter was successful with engaging the origin of left bronchial artery. Angiogram performed. Micro catheter system was then advanced into the left bronchial artery for  empiric embolization. Five hundred - 700 micro meter embospheres were used. One vial used. Repeat angiogram was performed. Angiogram of the right common femoral artery was performed. Exoseal was deployed. Patient tolerated the procedure well and remained hemodynamically stable throughout. No complications were encountered and no significant blood loss encountered. FINDINGS: Thoracic aorta demonstrates no dissection flap or aneurysm. No significant atherosclerotic disease along the lumen of the aorta. Multiple segmental vessels identified including 2 right bronchial artery and single left bronchial artery from the aortic angiogram. Superior right bronchial artery angiogram: Tortuous vasculature without stenosis at the origin. Vessel is of large caliber and supplies a cluster of abnormal vessels at the apex of the lung. Inflammatory tissue at the lung apex a demonstrates dense enhancement with no early draining vein identified. No evidence of arterial contribution towards the spinal canal. Status post microsphere embolization of the right superior bronchial artery there is no significant residual blood flow. Angiogram right supreme intercostal artery angiogram: No contribution to abnormal lung tissue. Angiogram right superior intercostal angiogram: No contribution to the lung apex. Left T9 segmental vessel angiogram: No contribution to the abnormal lung tissue. No reticular medullary vessel identified. Right inferior bronchial artery angiogram: No contribution to the abnormal vasculature at the right apex. Left T8 segmental vessel angiogram: No contribution to abnormal left-sided lung tissue. Left bronchial artery angiogram: Circuitous vasculature with significant contribution to abnormal lung tissue at the left apex. There is dense opacification of inflammatory tissue at the left apex. No extravasation is identified. No contribution of vasculature directed towards the spinal canal. Status post empiric embolization  with 500-700 embospheres, no significant contribution of the left bronchial artery to the left apex. IMPRESSION: Status post thoracic aortic and segmental vessel angiogram with empiric embolization of left bronchial artery and superior right bronchial artery, each contributing significant blood flow to abnormal enhancing inflammatory tissue at the left and right apex, respectively. Both artery were embolized to stasis, with no significant opacification of abnormal tissue at the completion. Status post Exoseal deployment. Signed, Dulcy Fanny. Earleen Newport, DO [facility] Radiology Electronically Signed   By: Corrie Mckusick D.O.   On: 10/13/2015 19:21   Ir Angiogram Selective Each Additional Vessel  10/13/2015  CLINICAL DATA:  39 year old male with a history of chronic pulmonary infection  with MAC, as well as prior resection for Aspergilloma. He has developed hemoptysis, with significant bleeding on the prior night. Bronchoscopy could not be performed because of concerns for the patient's pulmonary status. Bilateral empiric embolization is planned. EXAM: ULTRASOUND GUIDED ACCESS RIGHT COMMON FEMORAL ARTERY THORACIC AORTIC ANGIOGRAM SELECTIVE ANGIOGRAM OF MULTIPLE BILATERAL THORACIC SEGMENTAL VESSELS INCLUDING BILATERAL BRONCHIAL ARTERIES. 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 Date:  11/24/201611/24/2016 3:52 pm FLUOROSCOPY TIME:  22 minutes, 24 seconds MEDICATIONS AND MEDICAL HISTORY: 1.0 mg Versed, 50 mcg fentanyl ANESTHESIA/SEDATION: 73 minutes CONTRAST:  235m OMNIPAQUE IOHEXOL 300 MG/ML  SOLN COMPLICATIONS: None PROCEDURE: Informed consent was obtained from the patient following explanation of the procedure, risks, benefits and alternatives. Specific risks include bleeding, infection, arterial injury, need for further surgery or procedure,  kidney injury, contrast reaction, non targeted embolization, neurologic injury/ deficit, cardiopulmonary collapse, death The patient understands, agrees and consents for the procedure. All questions were addressed. A time out was performed. Patient is position in the supine position on the fluoroscopy table. The right inguinal region was prepped and draped in the usual sterile fashion. Maximum sterile protection was used including gown and gloves mask. 1% lidocaine was used for local anesthesia. Ultrasound survey of the right inguinal region was performed with images stored and sent to PACs. A micropuncture needle was used access the right common femoral artery under ultrasound. With excellent arterial blood flow returned, and an .018 micro wire was passed through the needle, observed enter the abdominal aorta under fluoroscopy. The needle was removed, and a micropuncture sheath was placed over the wire. The inner dilator and wire were removed, and an 035 Bentson wire was advanced under fluoroscopy into the abdominal aorta. The sheath was removed and a standard 5 [place] sheath was placed. The dilator was removed and the sheath was flushed. Bentson wire was advanced to the aortic arch, and a pigtail catheter was passed over the wire to the aortic arch. Thoracic aortic angiogram was performed. Pigtail catheter was exchanged for a Mickelson catheter which was used to select multiple segmental vessels of the thoracic arch. Angiogram was performed of each vessel selected. Once the right bronchial artery was engaged, angiogram was performed. Micro catheter system including a high-flow Renegade catheter was passed into the right bronchial artery, at a safe distance from the origin to avoid refluxing into the aorta. Empiric embolization was then performed with 500 - 700 micro meter embospheres. One vial was used. Micro catheter system was then removed, and a final angiogram was performed through the base catheter.  Mickelson catheter was then used to select multiple segmental vessels searching for left bronchial artery. Exchange was made over the Bentson wire for a shunt catheter. The Chung catheter was successful with engaging the origin of left bronchial artery. Angiogram performed. Micro catheter system was then advanced into the left bronchial artery for empiric embolization. Five hundred - 700 micro meter embospheres were used. One vial used. Repeat angiogram was performed. Angiogram of the right common femoral artery was performed. Exoseal was deployed. Patient tolerated the procedure well and remained hemodynamically stable throughout. No complications were encountered and no significant blood loss encountered. FINDINGS: Thoracic aorta demonstrates no dissection flap or aneurysm. No significant atherosclerotic disease along the lumen of the aorta. Multiple segmental vessels identified including 2 right bronchial artery and single left bronchial artery from the aortic angiogram. Superior right bronchial artery angiogram: Tortuous  vasculature without stenosis at the origin. Vessel is of large caliber and supplies a cluster of abnormal vessels at the apex of the lung. Inflammatory tissue at the lung apex a demonstrates dense enhancement with no early draining vein identified. No evidence of arterial contribution towards the spinal canal. Status post microsphere embolization of the right superior bronchial artery there is no significant residual blood flow. Angiogram right supreme intercostal artery angiogram: No contribution to abnormal lung tissue. Angiogram right superior intercostal angiogram: No contribution to the lung apex. Left T9 segmental vessel angiogram: No contribution to the abnormal lung tissue. No reticular medullary vessel identified. Right inferior bronchial artery angiogram: No contribution to the abnormal vasculature at the right apex. Left T8 segmental vessel angiogram: No contribution to abnormal  left-sided lung tissue. Left bronchial artery angiogram: Circuitous vasculature with significant contribution to abnormal lung tissue at the left apex. There is dense opacification of inflammatory tissue at the left apex. No extravasation is identified. No contribution of vasculature directed towards the spinal canal. Status post empiric embolization with 500-700 embospheres, no significant contribution of the left bronchial artery to the left apex. IMPRESSION: Status post thoracic aortic and segmental vessel angiogram with empiric embolization of left bronchial artery and superior right bronchial artery, each contributing significant blood flow to abnormal enhancing inflammatory tissue at the left and right apex, respectively. Both artery were embolized to stasis, with no significant opacification of abnormal tissue at the completion. Status post Exoseal deployment. Signed, Dulcy Fanny. Earleen Newport, DO [facility] Radiology Electronically Signed   By: Corrie Mckusick D.O.   On: 10/13/2015 19:21   Ir Angiogram Follow Up Study  10/13/2015  CLINICAL DATA:  39 year old male with a history of chronic pulmonary infection with MAC, as well as prior resection for Aspergilloma. He has developed hemoptysis, with significant bleeding on the prior night. Bronchoscopy could not be performed because of concerns for the patient's pulmonary status. Bilateral empiric embolization is planned. EXAM: ULTRASOUND GUIDED ACCESS RIGHT COMMON FEMORAL ARTERY THORACIC AORTIC ANGIOGRAM SELECTIVE ANGIOGRAM OF MULTIPLE BILATERAL THORACIC SEGMENTAL VESSELS INCLUDING BILATERAL BRONCHIAL ARTERIES. 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 Date:  11/24/201611/24/2016 3:52 pm FLUOROSCOPY TIME:  22 minutes, 24 seconds MEDICATIONS AND MEDICAL HISTORY: 1.0 mg Versed, 50 mcg fentanyl  ANESTHESIA/SEDATION: 73 minutes CONTRAST:  235m OMNIPAQUE IOHEXOL 300 MG/ML  SOLN COMPLICATIONS: None PROCEDURE: Informed consent was obtained from the patient following explanation of the procedure, risks, benefits and alternatives. Specific risks include bleeding, infection, arterial injury, need for further surgery or procedure, kidney injury, contrast reaction, non targeted embolization, neurologic injury/ deficit, cardiopulmonary collapse, death The patient understands, agrees and consents for the procedure. All questions were addressed. A time out was performed. Patient is position in the supine position on the fluoroscopy table. The right inguinal region was prepped and draped in the usual sterile fashion. Maximum sterile protection was used including gown and gloves mask. 1% lidocaine was used for local anesthesia. Ultrasound survey of the right inguinal region was performed with images stored and sent to PACs. A micropuncture needle was used access the right common femoral artery under ultrasound. With excellent arterial blood flow returned, and an .018 micro wire was passed through the needle, observed enter the abdominal aorta under fluoroscopy. The needle was removed, and a micropuncture sheath was placed over the wire. The inner dilator and wire were removed, and an 035 Bentson wire  was advanced under fluoroscopy into the abdominal aorta. The sheath was removed and a standard 5 [place] vascular sheath was placed. The dilator was removed and the sheath was flushed. Bentson wire was advanced to the aortic arch, and a pigtail catheter was passed over the wire to the aortic arch. Thoracic aortic angiogram was performed. Pigtail catheter was exchanged for a Mickelson catheter which was used to select multiple segmental vessels of the thoracic arch. Angiogram was performed of each vessel selected. Once the right bronchial artery was engaged, angiogram was performed. Micro catheter system including a high-flow  Renegade catheter was passed into the right bronchial artery, at a safe distance from the origin to avoid refluxing into the aorta. Empiric embolization was then performed with 500 - 700 micro meter embospheres. One vial was used. Micro catheter system was then removed, and a final angiogram was performed through the base catheter. Mickelson catheter was then used to select multiple segmental vessels searching for left bronchial artery. Exchange was made over the Bentson wire for a shunt catheter. The Chung catheter was successful with engaging the origin of left bronchial artery. Angiogram performed. Micro catheter system was then advanced into the left bronchial artery for empiric embolization. Five hundred - 700 micro meter embospheres were used. One vial used. Repeat angiogram was performed. Angiogram of the right common femoral artery was performed. Exoseal was deployed. Patient tolerated the procedure well and remained hemodynamically stable throughout. No complications were encountered and no significant blood loss encountered. FINDINGS: Thoracic aorta demonstrates no dissection flap or aneurysm. No significant atherosclerotic disease along the lumen of the aorta. Multiple segmental vessels identified including 2 right bronchial artery and single left bronchial artery from the aortic angiogram. Superior right bronchial artery angiogram: Tortuous vasculature without stenosis at the origin. Vessel is of large caliber and supplies a cluster of abnormal vessels at the apex of the lung. Inflammatory tissue at the lung apex a demonstrates dense enhancement with no early draining vein identified. No evidence of arterial contribution towards the spinal canal. Status post microsphere embolization of the right superior bronchial artery there is no significant residual blood flow. Angiogram right supreme intercostal artery angiogram: No contribution to abnormal lung tissue. Angiogram right superior intercostal  angiogram: No contribution to the lung apex. Left T9 segmental vessel angiogram: No contribution to the abnormal lung tissue. No reticular medullary vessel identified. Right inferior bronchial artery angiogram: No contribution to the abnormal vasculature at the right apex. Left T8 segmental vessel angiogram: No contribution to abnormal left-sided lung tissue. Left bronchial artery angiogram: Circuitous vasculature with significant contribution to abnormal lung tissue at the left apex. There is dense opacification of inflammatory tissue at the left apex. No extravasation is identified. No contribution of vasculature directed towards the spinal canal. Status post empiric embolization with 500-700 embospheres, no significant contribution of the left bronchial artery to the left apex. IMPRESSION: Status post thoracic aortic and segmental vessel angiogram with empiric embolization of left bronchial artery and superior right bronchial artery, each contributing significant blood flow to abnormal enhancing inflammatory tissue at the left and right apex, respectively. Both artery were embolized to stasis, with no significant opacification of abnormal tissue at the completion. Status post Exoseal deployment. Signed, Dulcy Fanny. Earleen Newport, DO [facility] Radiology Electronically Signed   By: Corrie Mckusick D.O.   On: 10/13/2015 19:21   Ir Angiogram Follow Up Study  10/13/2015  CLINICAL DATA:  39 year old male with a history of chronic pulmonary infection  with MAC, as well as prior resection for Aspergilloma. He has developed hemoptysis, with significant bleeding on the prior night. Bronchoscopy could not be performed because of concerns for the patient's pulmonary status. Bilateral empiric embolization is planned. EXAM: ULTRASOUND GUIDED ACCESS RIGHT COMMON FEMORAL ARTERY THORACIC AORTIC ANGIOGRAM SELECTIVE ANGIOGRAM OF MULTIPLE BILATERAL THORACIC SEGMENTAL VESSELS INCLUDING  BILATERAL BRONCHIAL ARTERIES. 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 Date:  11/24/201611/24/2016 3:52 pm FLUOROSCOPY TIME:  22 minutes, 24 seconds MEDICATIONS AND MEDICAL HISTORY: 1.0 mg Versed, 50 mcg fentanyl ANESTHESIA/SEDATION: 73 minutes CONTRAST:  277m OMNIPAQUE IOHEXOL 300 MG/ML  SOLN COMPLICATIONS: None PROCEDURE: Informed consent was obtained from the patient following explanation of the procedure, risks, benefits and alternatives. Specific risks include bleeding, infection, arterial injury, need for further surgery or procedure, kidney injury, contrast reaction, non targeted embolization, neurologic injury/ deficit, cardiopulmonary collapse, death The patient understands, agrees and consents for the procedure. All questions were addressed. A time out was performed. Patient is position in the supine position on the fluoroscopy table. The right inguinal region was prepped and draped in the usual sterile fashion. Maximum sterile protection was used including gown and gloves mask. 1% lidocaine was used for local anesthesia. Ultrasound survey of the right inguinal region was performed with images stored and sent to PACs. A micropuncture needle was used access the right common femoral artery under ultrasound. With excellent arterial blood flow returned, and an .018 micro wire was passed through the needle, observed enter the abdominal aorta under fluoroscopy. The needle was removed, and a micropuncture sheath was placed over the wire. The inner dilator and wire were removed, and an 035 Bentson wire was advanced under fluoroscopy into the abdominal aorta. The sheath was removed and a standard 5 [place] sheath was placed. The dilator was removed and the sheath was flushed. Bentson wire was advanced to the aortic arch, and a pigtail catheter was passed over the wire to the  aortic arch. Thoracic aortic angiogram was performed. Pigtail catheter was exchanged for a Mickelson catheter which was used to select multiple segmental vessels of the thoracic arch. Angiogram was performed of each vessel selected. Once the right bronchial artery was engaged, angiogram was performed. Micro catheter system including a high-flow Renegade catheter was passed into the right bronchial artery, at a safe distance from the origin to avoid refluxing into the aorta. Empiric embolization was then performed with 500 - 700 micro meter embospheres. One vial was used. Micro catheter system was then removed, and a final angiogram was performed through the base catheter. Mickelson catheter was then used to select multiple segmental vessels searching for left bronchial artery. Exchange was made over the Bentson wire for a shunt catheter. The Chung catheter was successful with engaging the origin of left bronchial artery. Angiogram performed. Micro catheter system was then advanced into the left bronchial artery for empiric embolization. Five hundred - 700 micro meter embospheres were used. One vial used. Repeat angiogram was performed. Angiogram of the right common femoral artery was performed. Exoseal was deployed. Patient tolerated the procedure well and remained hemodynamically stable throughout. No complications were encountered and no significant blood loss encountered. FINDINGS: Thoracic aorta demonstrates no dissection flap or aneurysm. No significant atherosclerotic disease along the lumen of the aorta. Multiple segmental vessels identified including 2 right bronchial artery and single left bronchial artery from the aortic angiogram. Superior right bronchial artery angiogram: Tortuous  vasculature without stenosis at the origin. Vessel is of large caliber and supplies a cluster of abnormal vessels at the apex of the lung. Inflammatory tissue at the lung apex a demonstrates dense enhancement with no early  draining vein identified. No evidence of arterial contribution towards the spinal canal. Status post microsphere embolization of the right superior bronchial artery there is no significant residual blood flow. Angiogram right supreme intercostal artery angiogram: No contribution to abnormal lung tissue. Angiogram right superior intercostal angiogram: No contribution to the lung apex. Left T9 segmental vessel angiogram: No contribution to the abnormal lung tissue. No reticular medullary vessel identified. Right inferior bronchial artery angiogram: No contribution to the abnormal vasculature at the right apex. Left T8 segmental vessel angiogram: No contribution to abnormal left-sided lung tissue. Left bronchial artery angiogram: Circuitous vasculature with significant contribution to abnormal lung tissue at the left apex. There is dense opacification of inflammatory tissue at the left apex. No extravasation is identified. No contribution of vasculature directed towards the spinal canal. Status post empiric embolization with 500-700 embospheres, no significant contribution of the left bronchial artery to the left apex. IMPRESSION: Status post thoracic aortic and segmental vessel angiogram with empiric embolization of left bronchial artery and superior right bronchial artery, each contributing significant blood flow to abnormal enhancing inflammatory tissue at the left and right apex, respectively. Both artery were embolized to stasis, with no significant opacification of abnormal tissue at the completion. Status post Exoseal deployment. Signed, Dulcy Fanny. Earleen Newport, DO [facility] Radiology Electronically Signed   By: Corrie Mckusick D.O.   On: 10/13/2015 19:21   Ir US Guide Vasc Access Right  10/13/2015  CLINICAL DATA:  39 year old male with a history of chronic pulmonary infection with MAC, as well as prior resection for Aspergilloma. He has developed hemoptysis, with  significant bleeding on the prior night. Bronchoscopy could not be performed because of concerns for the patient's pulmonary status. Bilateral empiric embolization is planned. EXAM: ULTRASOUND GUIDED ACCESS RIGHT COMMON FEMORAL ARTERY THORACIC AORTIC ANGIOGRAM SELECTIVE ANGIOGRAM OF MULTIPLE BILATERAL THORACIC SEGMENTAL VESSELS INCLUDING BILATERAL BRONCHIAL ARTERIES. 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 Date:  11/24/201611/24/2016 3:52 pm FLUOROSCOPY TIME:  22 minutes, 24 seconds MEDICATIONS AND MEDICAL HISTORY: 1.0 mg Versed, 50 mcg fentanyl ANESTHESIA/SEDATION: 73 minutes CONTRAST:  246m OMNIPAQUE IOHEXOL 300 MG/ML  SOLN COMPLICATIONS: None PROCEDURE: Informed consent was obtained from the patient following explanation of the procedure, risks, benefits and alternatives. Specific risks include bleeding, infection, arterial injury, need for further surgery or procedure, kidney injury, contrast reaction, non targeted embolization, neurologic injury/ deficit, cardiopulmonary collapse, death The patient understands, agrees and consents for the procedure. All questions were addressed. A time out was performed. Patient is position in the supine position on the fluoroscopy table. The right inguinal region was prepped and draped in the usual sterile fashion. Maximum sterile protection was used including gown and gloves mask. 1% lidocaine was used for local anesthesia. Ultrasound survey of the right inguinal region was performed with images stored and sent to PACs. A micropuncture needle was used access the right common femoral artery under ultrasound. With excellent arterial blood flow returned, and an .018 micro wire was passed through the needle, observed enter the abdominal aorta under fluoroscopy. The needle was removed, and a micropuncture sheath was placed over the wire. The inner  dilator and wire were removed, and an 035  Bentson wire was advanced under fluoroscopy into the abdominal aorta. The sheath was removed and a standard 5 [place] vascular sheath was placed. The dilator was removed and the sheath was flushed. Bentson wire was advanced to the aortic arch, and a pigtail catheter was passed over the wire to the aortic arch. Thoracic aortic angiogram was performed. Pigtail catheter was exchanged for a Mickelson catheter which was used to select multiple segmental vessels of the thoracic arch. Angiogram was performed of each vessel selected. Once the right bronchial artery was engaged, angiogram was performed. Micro catheter system including a high-flow Renegade catheter was passed into the right bronchial artery, at a safe distance from the origin to avoid refluxing into the aorta. Empiric embolization was then performed with 500 - 700 micro meter embospheres. One vial was used. Micro catheter system was then removed, and a final angiogram was performed through the base catheter. Mickelson catheter was then used to select multiple segmental vessels searching for left bronchial artery. Exchange was made over the Bentson wire for a shunt catheter. The Chung catheter was successful with engaging the origin of left bronchial artery. Angiogram performed. Micro catheter system was then advanced into the left bronchial artery for empiric embolization. Five hundred - 700 micro meter embospheres were used. One vial used. Repeat angiogram was performed. Angiogram of the right common femoral artery was performed. Exoseal was deployed. Patient tolerated the procedure well and remained hemodynamically stable throughout. No complications were encountered and no significant blood loss encountered. FINDINGS: Thoracic aorta demonstrates no dissection flap or aneurysm. No significant atherosclerotic disease along the lumen of the aorta. Multiple segmental vessels identified including 2 right bronchial artery  and single left bronchial artery from the aortic angiogram. Superior right bronchial artery angiogram: Tortuous vasculature without stenosis at the origin. Vessel is of large caliber and supplies a cluster of abnormal vessels at the apex of the lung. Inflammatory tissue at the lung apex a demonstrates dense enhancement with no early draining vein identified. No evidence of arterial contribution towards the spinal canal. Status post microsphere embolization of the right superior bronchial artery there is no significant residual blood flow. Angiogram right supreme intercostal artery angiogram: No contribution to abnormal lung tissue. Angiogram right superior intercostal angiogram: No contribution to the lung apex. Left T9 segmental vessel angiogram: No contribution to the abnormal lung tissue. No reticular medullary vessel identified. Right inferior bronchial artery angiogram: No contribution to the abnormal vasculature at the right apex. Left T8 segmental vessel angiogram: No contribution to abnormal left-sided lung tissue. Left bronchial artery angiogram: Circuitous vasculature with significant contribution to abnormal lung tissue at the left apex. There is dense opacification of inflammatory tissue at the left apex. No extravasation is identified. No contribution of vasculature directed towards the spinal canal. Status post empiric embolization with 500-700 embospheres, no significant contribution of the left bronchial artery to the left apex. IMPRESSION: Status post thoracic aortic and segmental vessel angiogram with empiric embolization of left bronchial artery and superior right bronchial artery, each contributing significant blood flow to abnormal enhancing inflammatory tissue at the left and right apex, respectively. Both artery were embolized to stasis, with no significant opacification of abnormal tissue at the completion. Status post Exoseal deployment. Signed, Dulcy Fanny. Earleen Newport, DO [facility] and  Interventional Radiology Specialists [facility] Radiology Electronically Signed   By: Corrie Mckusick D.O.   On: 10/13/2015 19:21   Dg Chest Port 1 View  10/14/2015  CLINICAL DATA:  Hemoptysis, mycobacterium avium intracellular complex. EXAM: PORTABLE  CHEST 1 VIEW COMPARISON:  October 13, 2015. FINDINGS: Stable cardiomediastinal silhouette. Bilateral upper lobe bulla are again noted and unchanged. Associated scarring in the both upper lobes is noted. Opacity is again noted in the left upper lobe most likely representing fungus ball. No significant pleural effusion is noted. Bony thorax is unremarkable. IMPRESSION: Stable bilateral bulla formation and scarring is noted in both upper lobes. Soft tissue density is again noted in left upper lobe most likely representing fungus ball as described on prior CT scan. No significant changes noted compared to prior exam. Electronically Signed   By: Marijo Conception, M.D.   On: 10/14/2015 07:41   Dg Chest Portable 1 View  10/13/2015  CLINICAL DATA:  Acute onset of hemoptysis and shortness of breath. Initial encounter. EXAM: PORTABLE CHEST 1 VIEW COMPARISON:  Chest radiograph performed 09/28/2014 FINDINGS: There appears to be an enlarging 3.9 cm mass near the left lung apex. Biapical pleural parenchymal scarring and bullous change are again seen, with somewhat worsening bilateral pulmonary nodularity. Pleural thickening near the left lung mass appears worsened, raising concern for local spread of disease. No pleural effusion or pneumothorax is seen. The cardiomediastinal silhouette is normal in size. No acute osseous abnormalities are identified. A chronic left fourth rib deformity is noted. IMPRESSION: 1. Apparent enlarging 3.9 cm mass near the left lung apex, concerning for malignancy. Pleural thickening near the left lung mass appears worsened, raising concern for local spread of disease. CT of the chest would be helpful for further evaluation, when and as deemed  clinically appropriate. 2. Somewhat worsening bilateral pulmonary nodularity noted. Biapical pleural parenchymal scarring and bullous change again noted. Electronically Signed   By: Garald Balding M.D.   On: 10/13/2015 05:22   [place] Guide Roadmapping  10/13/2015  CLINICAL DATA:  39 year old male with a history of chronic pulmonary infection with MAC, as well as prior resection for Aspergilloma. He has developed hemoptysis, with significant bleeding on the prior night. Bronchoscopy could not be performed because of concerns for the patient's pulmonary status. Bilateral empiric embolization is planned. EXAM: ULTRASOUND GUIDED ACCESS RIGHT COMMON FEMORAL ARTERY THORACIC AORTIC ANGIOGRAM SELECTIVE ANGIOGRAM OF MULTIPLE BILATERAL THORACIC SEGMENTAL VESSELS INCLUDING BILATERAL BRONCHIAL ARTERIES. 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 Date:  11/24/201611/24/2016 3:52 pm FLUOROSCOPY TIME:  22 minutes, 24 seconds MEDICATIONS AND MEDICAL HISTORY: 1.0 mg Versed, 50 mcg fentanyl ANESTHESIA/SEDATION: 73 minutes CONTRAST:  246m OMNIPAQUE IOHEXOL 300 MG/ML  SOLN COMPLICATIONS: None PROCEDURE: Informed consent was obtained from the patient following explanation of the procedure, risks, benefits and alternatives. Specific risks include bleeding, infection, arterial injury, need for further surgery or procedure, kidney injury, contrast reaction, non targeted embolization, neurologic injury/ deficit, cardiopulmonary collapse, death The patient understands, agrees and consents for the procedure. All questions were addressed. A time out was performed. Patient is position in the supine position on the fluoroscopy table. The right inguinal region was prepped and draped in the usual sterile fashion. Maximum sterile protection was used including gown and gloves mask.  1% lidocaine was used for local anesthesia. Ultrasound survey of the right inguinal region was performed with images stored and sent to PACs. A micropuncture needle was used access the right common femoral artery under ultrasound. With excellent arterial blood flow returned, and an .018 micro wire was passed through the needle, observed enter the abdominal aorta  under fluoroscopy. The needle was removed, and a micropuncture sheath was placed over the wire. The inner dilator and wire were removed, and an 035 Bentson wire was advanced under fluoroscopy into the abdominal aorta. The sheath was removed and a standard 5 [place] vascular sheath was placed. The dilator was removed and the sheath was flushed. Bentson wire was advanced to the aortic arch, and a pigtail catheter was passed over the wire to the aortic arch. Thoracic aortic angiogram was performed. Pigtail catheter was exchanged for a Mickelson catheter which was used to select multiple segmental vessels of the thoracic arch. Angiogram was performed of each vessel selected. Once the right bronchial artery was engaged, angiogram was performed. Micro catheter system including a high-flow Renegade catheter was passed into the right bronchial artery, at a safe distance from the origin to avoid refluxing into the aorta. Empiric embolization was then performed with 500 - 700 micro meter embospheres. One vial was used. Micro catheter system was then removed, and a final angiogram was performed through the base catheter. Mickelson catheter was then used to select multiple segmental vessels searching for left bronchial artery. Exchange was made over the Bentson wire for a shunt catheter. The Chung catheter was successful with engaging the origin of left bronchial artery. Angiogram performed. Micro catheter system was then advanced into the left bronchial artery for empiric embolization. Five hundred - 700 micro meter embospheres were used. One vial used. Repeat angiogram  was performed. Angiogram of the right common femoral artery was performed. Exoseal was deployed. Patient tolerated the procedure well and remained hemodynamically stable throughout. No complications were encountered and no significant blood loss encountered. FINDINGS: Thoracic aorta demonstrates no dissection flap or aneurysm. No significant atherosclerotic disease along the lumen of the aorta. Multiple segmental vessels identified including 2 right bronchial artery and single left bronchial artery from the aortic angiogram. Superior right bronchial artery angiogram: Tortuous vasculature without stenosis at the origin. Vessel is of large caliber and supplies a cluster of abnormal vessels at the apex of the lung. Inflammatory tissue at the lung apex a demonstrates dense enhancement with no early draining vein identified. No evidence of arterial contribution towards the spinal canal. Status post microsphere embolization of the right superior bronchial artery there is no significant residual blood flow. Angiogram right supreme intercostal artery angiogram: No contribution to abnormal lung tissue. Angiogram right superior intercostal angiogram: No contribution to the lung apex. Left T9 segmental vessel angiogram: No contribution to the abnormal lung tissue. No reticular medullary vessel identified. Right inferior bronchial artery angiogram: No contribution to the abnormal vasculature at the right apex. Left T8 segmental vessel angiogram: No contribution to abnormal left-sided lung tissue. Left bronchial artery angiogram: Circuitous vasculature with significant contribution to abnormal lung tissue at the left apex. There is dense opacification of inflammatory tissue at the left apex. No extravasation is identified. No contribution of vasculature directed towards the spinal canal. Status post empiric embolization with 500-700 embospheres, no significant contribution of the left bronchial artery to the left apex.  IMPRESSION: Status post thoracic aortic and segmental vessel angiogram with empiric embolization of left bronchial artery and superior right bronchial artery, each contributing significant blood flow to abnormal enhancing inflammatory tissue at the left and right apex, respectively. Both artery were embolized to stasis, with no significant opacification of abnormal tissue at the completion. Status post Exoseal deployment. Signed, Dulcy Fanny. Earleen Newport, DO [facility] Special[facility] Radiology Electronically Signed   By: Corrie Mckusick D.O.  On: 10/13/2015 19:21   [place] Guide Roadmapping  10/13/2015  CLINICAL DATA:  39 year old male with a history of chronic pulmonary infection with MAC, as well as prior resection for Aspergilloma. He has developed hemoptysis, with significant bleeding on the prior night. Bronchoscopy could not be performed because of concerns for the patient's pulmonary status. Bilateral empiric embolization is planned. EXAM: ULTRASOUND GUIDED ACCESS RIGHT COMMON FEMORAL ARTERY THORACIC AORTIC ANGIOGRAM SELECTIVE ANGIOGRAM OF MULTIPLE BILATERAL THORACIC SEGMENTAL VESSELS INCLUDING BILATERAL BRONCHIAL ARTERIES. 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 Date:  11/24/201611/24/2016 3:52 pm FLUOROSCOPY TIME:  22 minutes, 24 seconds MEDICATIONS AND MEDICAL HISTORY: 1.0 mg Versed, 50 mcg fentanyl ANESTHESIA/SEDATION: 73 minutes CONTRAST:  27m OMNIPAQUE IOHEXOL 300 MG/ML  SOLN COMPLICATIONS: None PROCEDURE: Informed consent was obtained from the patient following explanation of the procedure, risks, benefits and alternatives. Specific risks include bleeding, infection, arterial injury, need for further surgery or procedure, kidney injury, contrast reaction, non targeted embolization, neurologic injury/ deficit,  cardiopulmonary collapse, death The patient understands, agrees and consents for the procedure. All questions were addressed. A time out was performed. Patient is position in the supine position on the fluoroscopy table. The right inguinal region was prepped and draped in the usual sterile fashion. Maximum sterile protection was used including gown and gloves mask. 1% lidocaine was used for local anesthesia. Ultrasound survey of the right inguinal region was performed with images stored and sent to PACs. A micropuncture needle was used access the right common femoral artery under ultrasound. With excellent arterial blood flow returned, and an .018 micro wire was passed through the needle, observed enter the abdominal aorta under fluoroscopy. The needle was removed, and a micropuncture sheath was placed over the wire. The inner dilator and wire were removed, and an 035 Bentson wire was advanced under fluoroscopy into the abdominal aorta. The sheath was removed and a standard 5 [place] sheath was placed. The dilator was removed and the sheath was flushed. Bentson wire was advanced to the aortic arch, and a pigtail catheter was passed over the wire to the aortic arch. Thoracic aortic angiogram was performed. Pigtail catheter was exchanged for a Mickelson catheter which was used to select multiple segmental vessels of the thoracic arch. Angiogram was performed of each vessel selected. Once the right bronchial artery was engaged, angiogram was performed. Micro catheter system including a high-flow Renegade catheter was passed into the right bronchial artery, at a safe distance from the origin to avoid refluxing into the aorta. Empiric embolization was then performed with 500 - 700 micro meter embospheres. One vial was used. Micro catheter system was then removed, and a final angiogram was performed through the base catheter. Mickelson catheter was then used to select multiple segmental vessels searching for left  bronchial artery. Exchange was made over the Bentson wire for a shunt catheter. The Chung catheter was successful with engaging the origin of left bronchial artery. Angiogram performed. Micro catheter system was then advanced into the left bronchial artery for empiric embolization. Five hundred - 700 micro meter embospheres were used. One vial used. Repeat angiogram was performed. Angiogram of the right common femoral artery was performed. Exoseal was deployed. Patient tolerated the procedure well and remained hemodynamically stable throughout. No complications were encountered and no significant blood loss encountered. FINDINGS: Thoracic aorta demonstrates no dissection flap or aneurysm. No significant  atherosclerotic disease along the lumen of the aorta. Multiple segmental vessels identified including 2 right bronchial artery and single left bronchial artery from the aortic angiogram. Superior right bronchial artery angiogram: Tortuous vasculature without stenosis at the origin. Vessel is of large caliber and supplies a cluster of abnormal vessels at the apex of the lung. Inflammatory tissue at the lung apex a demonstrates dense enhancement with no early draining vein identified. No evidence of arterial contribution towards the spinal canal. Status post microsphere embolization of the right superior bronchial artery there is no significant residual blood flow. Angiogram right supreme intercostal artery angiogram: No contribution to abnormal lung tissue. Angiogram right superior intercostal angiogram: No contribution to the lung apex. Left T9 segmental vessel angiogram: No contribution to the abnormal lung tissue. No reticular medullary vessel identified. Right inferior bronchial artery angiogram: No contribution to the abnormal vasculature at the right apex. Left T8 segmental vessel angiogram: No contribution to abnormal left-sided lung tissue. Left bronchial artery angiogram: Circuitous vasculature with  significant contribution to abnormal lung tissue at the left apex. There is dense opacification of inflammatory tissue at the left apex. No extravasation is identified. No contribution of vasculature directed towards the spinal canal. Status post empiric embolization with 500-700 embospheres, no significant contribution of the left bronchial artery to the left apex. IMPRESSION: Status post thoracic aortic and segmental vessel angiogram with empiric embolization of left bronchial artery and superior right bronchial artery, each contributing significant blood flow to abnormal enhancing inflammatory tissue at the left and right apex, respectively. Both artery were embolized to stasis, with no significant opacification of abnormal tissue at the completion. Status post Exoseal deployment. Signed, Dulcy Fanny. Earleen Newport, DO [facility] Inter[facility] Radiology Specialists [facility] Radiology Electronically Signed   By: Corrie Mckusick D.O.   On: 10/13/2015 19:21    Micro Results      Recent Results (from the past 240 hour(s))  Blood culture (routine x 2)     Status: None   Collection Time: 10/13/15  4:30 AM  Result Value Ref Range Status   Specimen Description BLOOD RIGHT ANTECUBITAL  Final   Special Requests BOTTLES DRAWN AEROBIC AND ANAEROBIC 5CC  Final   Culture   Final    NO GROWTH 5 DAYS Performed at [facility]    Report Status 10/18/2015 FINAL  Final  Blood culture (routine x 2)     Status: None   Collection Time: 10/13/15  4:35 AM  Result Value Ref Range Status   Specimen Description BLOOD BLOOD RIGHT FOREARM  Final   Special Requests BOTTLES DRAWN AEROBIC AND ANAEROBIC 5CC  Final   Culture   Final    NO GROWTH 5 DAYS Performed at [facility]    Report Status 10/18/2015 FINAL  Final  MRSA PCR Screening     Status: None   Collection Time: 10/13/15  9:15 AM  Result Value Ref Range Status   MRSA by PCR NEGATIVE NEGATIVE Final    Comment:        The GeneXpert MRSA Assay  (FDA approved for NASAL specimens only), is one component of a comprehensive MRSA colonization surveillance program. It is not intended to diagnose MRSA infection nor to guide or monitor treatment for MRSA infections.   AFB culture with smear     Status: None (Preliminary result)   Collection Time: 10/13/15 12:00 PM  Result Value Ref Range Status   Specimen Description SPUTUM  Final   Special Requests Immunocompromised  Final  Acid Fast Smear   Final    NO ACID FAST BACILLI SEEN Performed at [facility]    Culture   Final    CULTURE WILL BE EXAMINED FOR 6 WEEKS BEFORE ISSUING A FINAL REPORT Performed at [facility]    Report Status PENDING  Incomplete  Culture, expectorated sputum-assessment     Status: None   Collection Time: 10/13/15 12:00 PM  Result Value Ref Range Status   Specimen Description EXPECTORATED SPUTUM  Final   Special Requests Immunocompromised  Final   Sputum evaluation   Final    THIS SPECIMEN IS ACCEPTABLE. RESPIRATORY CULTURE REPORT TO FOLLOW.   Report Status 10/13/2015 FINAL  Final  Culture, respiratory (NON-Expectorated)     Status: None   Collection Time: 10/13/15 12:00 PM  Result Value Ref Range Status   Specimen Description SPUTUM  Final   Special Requests NONE  Final   Gram Stain   Final    ABUNDANT WBC PRESENT,BOTH PMN AND MONONUCLEAR RARE SQUAMOUS EPITHELIAL CELLS PRESENT MODERATE GRAM POSITIVE COCCI IN PAIRS IN CHAINS IN CLUSTERS Performed at [facility]    Culture   Final    NORMAL OROPHARYNGEAL FLORA Performed at [facility]    Report Status 10/16/2015 FINAL  Final  Culture, expectorated sputum-assessment     Status: None   Collection Time: 10/15/15  2:26 AM  Result Value Ref Range Status   Specimen Description SPUTUM  Final   Special Requests Normal  Final   Sputum evaluation   Final    THIS SPECIMEN IS ACCEPTABLE. RESPIRATORY CULTURE REPORT TO FOLLOW.   Report Status 10/15/2015 FINAL  Final   Culture, respiratory (NON-Expectorated)     Status: None   Collection Time: 10/15/15  2:26 AM  Result Value Ref Range Status   Specimen Description EXPECTORATED SPUTUM  Final   Special Requests NONE  Final   Gram Stain   Final    RARE WBC PRESENT, PREDOMINANTLY PMN RARE SQUAMOUS EPITHELIAL CELLS PRESENT MODERATE GRAM POSITIVE COCCI IN PAIRS AND CHAINS Performed at [facility]    Culture   Final    NORMAL OROPHARYNGEAL FLORA Performed at [facility]    Report Status 10/18/2015 FINAL  Final  AFB culture with smear     Status: None (Preliminary result)   Collection Time: 10/16/15  2:27 PM  Result Value Ref Range Status   Specimen Description SPUTUM  Final   Special Requests NONE  Final   Acid Fast Smear   Final    NO ACID FAST BACILLI SEEN Performed at [facility]    Culture   Final    CULTURE WILL BE EXAMINED FOR 6 WEEKS BEFORE ISSUING A FINAL REPORT Performed at [facility]    Report Status PENDING  Incomplete  AFB culture with smear     Status: None (Preliminary result)   Collection Time: 10/16/15  6:52 PM  Result Value Ref Range Status   Specimen Description SPUTUM  Final   Special Requests Immunocompromised  Final   Acid Fast Smear   Final    NO ACID FAST BACILLI SEEN Performed at [facility]    Culture   Final    CULTURE WILL BE EXAMINED FOR 6 WEEKS BEFORE ISSUING A FINAL REPORT Performed at [facility]    Report Status PENDING  Incomplete       Today   Subjective    [name] today has no headache,no chest abdominal pain,no  new weakness tingling or numbness, feels much better wants to go home today.     Objective   Blood pressure 135/86, pulse 100, temperature 98.3 F (36.8 C), temperature source Oral, resp. rate 22, height _0  (1.753 m), weight 60.6 kg (133 lb 9.6 oz), SpO2 91 %.   Intake/Output Summary (Last 24 hours) at 10/20/15 1207 Last data filed at 10/20/15 5379  Gross per 24 hour   Intake    360 ml  Output    200 ml  Net    160 ml    Exam Awake Alert, Oriented x 3, No new F.N deficits, Normal affect [place].AT,PERRAL Supple Neck,No JVD, No cervical lymphadenopathy appriciated.  Symmetrical Chest wall movement, Good air movement bilaterally, CTAB RRR,No Gallops,Rubs or new Murmurs, No Parasternal Heave +ve B.Sounds, Abd Soft, Non tender, No organomegaly appriciated, No rebound -guarding or rigidity. No Cyanosis, Clubbing or edema, No new Rash or bruise   Data Review   CBC w Diff: Lab Results  Component Value Date   WBC 12.6* 10/15/2015   HGB 13.5 10/15/2015   HCT 39.6 10/15/2015   PLT 166 10/15/2015   LYMPHOPCT 8 10/15/2015   MONOPCT 6 10/15/2015   EOSPCT 1 10/15/2015   BASOPCT 0 10/15/2015    CMP: Lab Results  Component Value Date   NA 136 10/15/2015   K 4.0 10/15/2015   CL 105 10/15/2015   CO2 24 10/15/2015   BUN 8 10/15/2015   CREATININE 0.91 10/15/2015   CREATININE 1.04 08/08/2015   PROT 7.0 10/13/2015   ALBUMIN 3.5 10/13/2015   BILITOT 1.1 10/13/2015   ALKPHOS 96 10/13/2015   AST 24 10/13/2015   ALT 14* 10/13/2015  .   Total Time in preparing paper work, data evaluation and todays exam - 35 minutes  Thurnell Lose M.D on 10/20/2015 at 12:07 PM  [facility]   Office  [phone]

## 2015-10-21 ENCOUNTER — Encounter (HOSPITAL_COMMUNITY): Payer: Self-pay | Admitting: Pulmonary Disease

## 2015-10-21 LAB — PNEUMOCYSTIS JIROVECI SMEAR BY DFA: PNEUMOCYSTIS JIROVECI AG: NEGATIVE

## 2015-10-21 LAB — MISC LABCORP TEST (SEND OUT): Labcorp test code: 183805

## 2015-10-23 LAB — CULTURE, BAL-QUANTITATIVE W GRAM STAIN: Colony Count: >=100000

## 2015-10-23 LAB — CULTURE, BAL-QUANTITATIVE: SPECIAL REQUESTS: NORMAL

## 2015-10-24 ENCOUNTER — Telehealth: Payer: Self-pay | Admitting: Internal Medicine

## 2015-10-24 ENCOUNTER — Other Ambulatory Visit: Payer: Self-pay | Admitting: Adult Health

## 2015-10-24 MED ORDER — TIOTROPIUM BROMIDE MONOHYDRATE 18 MCG IN CAPS: 18.0000 ug | ORAL_CAPSULE | Freq: Every day | RESPIRATORY_TRACT | Status: DC

## 2015-10-24 NOTE — Telephone Encounter (Signed)
Do anoro/stiolto/bevespi + a free standing ICS like asmanex/flovent/qvar/pulmicort - whatever dose you have . Can call him 10/25/15

## 2015-10-24 NOTE — Telephone Encounter (Signed)
No incruse or tudorza samples in office at this time.  Please advise on another medication.  Thanks!

## 2015-10-24 NOTE — Telephone Encounter (Signed)
Called spoke with pt. He reports he has reached his max limit through insurance for them to cover his medication. Now is it $346. We do not have any samples at this time. He won't be able to pay for this again until January. Requesting samples of diff medication. Please advise thanks

## 2015-10-24 NOTE — Telephone Encounter (Signed)
Give him tudorza or incruse samples. If you do not hae those let me know

## 2015-10-24 NOTE — Telephone Encounter (Signed)
Called spoke with pt. He needs refill on spiriva HH. I have sent this in. Nothing further needed

## 2015-10-26 ENCOUNTER — Encounter: Payer: Self-pay | Admitting: *Deleted

## 2015-10-26 ENCOUNTER — Telehealth: Payer: Self-pay | Admitting: *Deleted

## 2015-10-26 DIAGNOSIS — B440 Invasive pulmonary aspergillosis: Secondary | ICD-10-CM | POA: Insufficient documentation

## 2015-10-26 NOTE — Telephone Encounter (Signed)
Give him ANORO + Pulmicort - he will need 2 months of supply atleast

## 2015-10-26 NOTE — Telephone Encounter (Signed)
[phone], pt cb

## 2015-10-26 NOTE — Telephone Encounter (Signed)
Pt called and notified of samples left up front per MR Pt voiced understanding of medication and instructions  Nothing else is needed at this time.

## 2015-10-26 NOTE — Telephone Encounter (Signed)
Sent PA for voriconazole via cover my meds. Landis Gandy, RN

## 2015-10-26 NOTE — Telephone Encounter (Signed)
MR, we have anoro samples in the office, as well as asmanex, qvar, and pulmicort flexhaler.  Please advise on which of these you'd prefer for the patient.  Thanks!

## 2015-10-27 NOTE — Telephone Encounter (Signed)
Faxed additional paperwork for PA to [place].

## 2015-11-01 NOTE — Telephone Encounter (Signed)
Voriconazole approved 10/27/15 - 04/23/16 reference [employer] [place] notified. They will call patient and process refills.

## 2015-11-02 ENCOUNTER — Inpatient Hospital Stay: Payer: BLUE CROSS/BLUE SHIELD | Admitting: Infectious Disease

## 2015-11-04 NOTE — Progress Notes (Signed)
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  Luther Hearing RN BSN Infection Prevention

## 2015-11-07 ENCOUNTER — Ambulatory Visit (INDEPENDENT_AMBULATORY_CARE_PROVIDER_SITE_OTHER): Payer: BLUE CROSS/BLUE SHIELD | Admitting: Adult Health

## 2015-11-07 ENCOUNTER — Ambulatory Visit (INDEPENDENT_AMBULATORY_CARE_PROVIDER_SITE_OTHER)
Admission: RE | Admit: 2015-11-07 | Discharge: 2015-11-07 | Disposition: A | Discharge status: Home / Self Care | Payer: BLUE CROSS/BLUE SHIELD | Source: Ambulatory Visit | Attending: Adult Health | Admitting: Adult Health

## 2015-11-07 ENCOUNTER — Encounter: Payer: Self-pay | Admitting: Adult Health

## 2015-11-07 VITALS — BP 118/78 | HR 74 | Temp 98.4°F | Ht 70.0 in | Wt 132.0 lb

## 2015-11-07 DIAGNOSIS — R042 Hemoptysis: Secondary | ICD-10-CM

## 2015-11-07 DIAGNOSIS — B4481 Allergic bronchopulmonary aspergillosis: Secondary | ICD-10-CM | POA: Diagnosis not present

## 2015-11-07 DIAGNOSIS — B449 Aspergillosis, unspecified: Secondary | ICD-10-CM | POA: Diagnosis not present

## 2015-11-07 NOTE — Patient Instructions (Signed)
Continue on  BREO 1 puff daily  Continue on Spiriva 1 puff daily, make sure you take this every day. Remain on prednisone  10 mg daily Continue on current regimen.  follow up with ID clinic as planned in 4 weeks  follow up [name] in 6- 8 weeks and As needed   Please contact office for sooner follow up if symptoms do not improve or worsen or seek emergency care

## 2015-11-07 NOTE — Assessment & Plan Note (Signed)
Patient's continue on Vfend. 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 Patient's follow up with ID clinic as planned next month

## 2015-11-07 NOTE — Assessment & Plan Note (Signed)
Continue on prednisone 10mg daily.

## 2015-11-07 NOTE — Progress Notes (Signed)
Subjective:    Patient ID: [name], male    DOB: 1976-02-23, 39 y.o.   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 Is compliant with symbicort and spiriva. Needs refills.  Has not had flu shot but will have it today 08/14/2012  11/07/2015 [facility] follow up  Patient returns for post hospital follow-up. Patient was recently admitted November 24 through December 1 for hemoptysis. Patient has a history of aspergilloma , status post VATS in 2012. 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 He presented to the emergency room with hemoptysis. CT chest angiogram showed aspergilloma in the left upper lung bulla. Individual radiology was consult did and he underwent embolization. He did well with no further hemoptysis.. Patient underwent a bronchoscopy. Initial AFB and fungal cultures are negative. Cytology was negative for malignancy, positive for inflammation. Remains on spiriva and BREO for COPD/Asthma .   since discharge he is starting to feel better. He's had no further hemoptysis.  Working full time.  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      Review of Systems  Constitutional: Negative for fever and unexpected weight change.  HENT: Negative for congestion, dental problem, ear pain, nosebleeds, postnasal drip, rhinorrhea, sinus pressure, sneezing, sore throat and trouble swallowing.   Eyes: Negative for redness and itching.  Respiratory: Positive for shortness of breath.Cardiovascular: Negative for  palpitations and leg swelling.  Gastrointestinal: Negative for nausea and vomiting.  Genitourinary: Negative for dysuria.  Musculoskeletal: Negative for joint swelling.  Skin: Negative for rash.  Neurological: Negative for headaches.  Hematological: Does not bruise/bleed easily.  Psychiatric/Behavioral: Negative for dysphoric mood. The patient is not nervous/anxious.        Objective:   Physical Exam Filed Vitals:   11/07/15 1425  BP: 118/78  Pulse: 74  Temp: 98.4 F (36.9 C)  TempSrc: Oral  Height: _0  (1.778 m)  Weight: 132 lb (59.875 kg)  SpO2: 96%   GEN: A/Ox3; pleasant , NAD thin   HEENT:  [place]/AT,  EACs-clear, TMs-wnl, NOSE-clear, THROAT-clear, no lesions, no postnasal drip or exudate noted.   NECK:  Supple w/ fair ROM; no JVD; normal carotid impulses w/o bruits; no thyromegaly or nodules palpated; no lymphadenopathy.  RESP  Decreased BS in bases .no accessory muscle use, no dullness to percussion  CARD:  RRR, no m/r/g  , no peripheral edema, pulses intact, no cyanosis or clubbing.  GI:   Soft & nt; nml bowel sounds; no organomegaly or masses detected.  Musco: Warm bil, no deformities or joint swelling noted.   Neuro: alert, no focal deficits noted.    Skin: Warm, no lesions or rashes, dry skin changes   CXR 11/07/15 reviewed independently Interval resolution of the previously identified mycetoma involving the left apical bulla. 2. No new/acute cardiopulmonary disease. 3. 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 4. Stable severe bullous emphysematous changes in the upper lobes and stable scar/bronchiectasis in the upper lobes.  Assessment & Plan:

## 2015-11-07 NOTE — Assessment & Plan Note (Signed)
Continue with follow-up with ID  No current regimen

## 2015-11-16 LAB — LEGIONELLA CULTURE: SPECIAL REQUESTS: NORMAL

## 2015-11-22 ENCOUNTER — Other Ambulatory Visit: Payer: Self-pay | Admitting: Internal Medicine

## 2015-11-22 ENCOUNTER — Other Ambulatory Visit: Payer: Self-pay | Admitting: *Deleted

## 2015-11-22 ENCOUNTER — Telehealth: Payer: Self-pay | Admitting: Internal Medicine

## 2015-11-22 ENCOUNTER — Ambulatory Visit: Payer: BLUE CROSS/BLUE SHIELD | Admitting: Adult Health

## 2015-11-22 DIAGNOSIS — A310 Pulmonary mycobacterial infection: Secondary | ICD-10-CM

## 2015-11-22 LAB — FUNGUS CULTURE W SMEAR
Fungal Smear: NONE SEEN
Special Requests: NORMAL

## 2015-11-22 MED ORDER — FLUTICASONE FUROATE-VILANTEROL 100-25 MCG/INH IN AEPB: 1.0000 | INHALATION_SPRAY | Freq: Every day | RESPIRATORY_TRACT | Status: DC

## 2015-11-22 MED ORDER — PREDNISONE 20 MG PO TABS: 10.0000 mg | ORAL_TABLET | Freq: Every day | ORAL | Status: DC

## 2015-11-22 MED ORDER — ETHAMBUTOL HCL 400 MG PO TABS: ORAL_TABLET | ORAL | Status: DC

## 2015-11-22 NOTE — Telephone Encounter (Signed)
[phone], pt cb

## 2015-11-22 NOTE — Telephone Encounter (Signed)
Spoke with pt. He needs refill on prednisone and Breo. These have been sent in. Nothing further was needed.

## 2015-11-22 NOTE — Telephone Encounter (Signed)
lmtcb x1 for pt. 

## 2015-11-25 LAB — AFB CULTURE WITH SMEAR (NOT AT ARMC): ACID FAST SMEAR: NONE SEEN

## 2015-11-28 LAB — AFB CULTURE WITH SMEAR (NOT AT ARMC): ACID FAST SMEAR: NONE SEEN

## 2015-11-29 LAB — AFB CULTURE WITH SMEAR (NOT AT ARMC): Acid Fast Smear: NONE SEEN

## 2015-12-02 LAB — AFB CULTURE WITH SMEAR (NOT AT ARMC)
Acid Fast Smear: NONE SEEN
Special Requests: NORMAL

## 2015-12-05 ENCOUNTER — Encounter: Payer: Self-pay | Admitting: Infectious Disease

## 2015-12-05 ENCOUNTER — Ambulatory Visit (INDEPENDENT_AMBULATORY_CARE_PROVIDER_SITE_OTHER): Payer: BLUE CROSS/BLUE SHIELD | Admitting: Infectious Disease

## 2015-12-05 VITALS — BP 116/81 | HR 78 | Temp 98.1°F | Wt 132.0 lb

## 2015-12-05 DIAGNOSIS — B449 Aspergillosis, unspecified: Secondary | ICD-10-CM

## 2015-12-05 DIAGNOSIS — B4481 Allergic bronchopulmonary aspergillosis: Secondary | ICD-10-CM | POA: Diagnosis not present

## 2015-12-05 DIAGNOSIS — Z77120 Contact with and (suspected) exposure to mold (toxic): Secondary | ICD-10-CM

## 2015-12-05 DIAGNOSIS — R042 Hemoptysis: Secondary | ICD-10-CM

## 2015-12-05 DIAGNOSIS — B440 Invasive pulmonary aspergillosis: Secondary | ICD-10-CM

## 2015-12-05 DIAGNOSIS — A310 Pulmonary mycobacterial infection: Secondary | ICD-10-CM | POA: Diagnosis not present

## 2015-12-05 HISTORY — DX: Contact with and (suspected) exposure to mold (toxic): Z77.120

## 2015-12-05 LAB — COMPLETE METABOLIC PANEL WITH GFR
ALT: 14 U/L (ref 9–46)
AST: 19 U/L (ref 10–40)
Albumin: 3.9 g/dL (ref 3.6–5.1)
Alkaline Phosphatase: 108 U/L (ref 40–115)
BUN: 10 mg/dL (ref 7–25)
CHLORIDE: 99 mmol/L (ref 98–110)
CO2: 26 mmol/L (ref 20–31)
CREATININE: 0.87 mg/dL (ref 0.60–1.35)
Calcium: 9.1 mg/dL (ref 8.6–10.3)
GFR, Est African American: >89 mL/min (ref >=60)
GFR, Est Non African American: >89 mL/min (ref >=60)
GLUCOSE: 93 mg/dL (ref 65–99)
POTASSIUM: 4 mmol/L (ref 3.5–5.3)
SODIUM: 135 mmol/L (ref 135–146)
Total Bilirubin: 0.6 mg/dL (ref 0.2–1.2)
Total Protein: 6.6 g/dL (ref 6.1–8.1)

## 2015-12-05 LAB — CBC WITH DIFFERENTIAL/PLATELET
BASOS ABS: 0 10*3/uL (ref 0.0–0.1)
BASOS PCT: 0 % (ref 0–1)
EOS PCT: 1 % (ref 0–5)
Eosinophils Absolute: 0.1 10*3/uL (ref 0.0–0.7)
HEMATOCRIT: 46.8 % (ref 39.0–52.0)
HEMOGLOBIN: 16.1 g/dL (ref 13.0–17.0)
LYMPHS PCT: 17 % (ref 12–46)
Lymphs Abs: 1.7 10*3/uL (ref 0.7–4.0)
MCH: 31.2 pg (ref 26.0–34.0)
MCHC: 34.4 g/dL (ref 30.0–36.0)
MCV: 90.7 fL (ref 78.0–100.0)
MONO ABS: 0.8 10*3/uL (ref 0.1–1.0)
MPV: 8.7 fL (ref 8.6–12.4)
Monocytes Relative: 8 % (ref 3–12)
NEUTROS ABS: 7.4 10*3/uL (ref 1.7–7.7)
Neutrophils Relative %: 74 % (ref 43–77)
Platelets: 262 10*3/uL (ref 150–400)
RBC: 5.16 MIL/uL (ref 4.22–5.81)
RDW: 13.5 % (ref 11.5–15.5)
WBC: 10 10*3/uL (ref 4.0–10.5)

## 2015-12-05 NOTE — Progress Notes (Signed)
Subjective:  Chief complaint still with some dyspnea on exertion worse than prior to his recent hospitalization not yet back to baseline   Patient ID: [name], male    DOB: August 29, 1976, 40 y.o.   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 We had taken him off his Mycobacterium avium drugs and then he developed fevers chills and malaise  Spring (2013) and was seen by my partner Dr. Megan Salon in late April  His AFB smears and cultures obtained in the clinic on that day prior to the initiation of azithromycin ethambutol ultimately failed to grow Mycobacterium avium. He had been doing well I saw him in [place] 2013 we discontinued his azithromycin and ethambutol again. Unfortunately he again shortly thereafter developed fevers cough malaise. He started back on azithromycin ethambutol. Cultures done for AFB here were again negative. The patient however felt dramatically better having been back on azithromycin and ethambutol.   I last saw him in January of 2015 when he was feeling relatively well.  He did have an episode of coughing up a few flecks of blood in January 2016 which prompted appt with [facility] but by time he made appt this had long since resolved. Was only a one time episode none since then.  I saw Wendelyn Breslow again in March and then in September 2016. In summer he was hospitalized with hemoptysis and found to have an area in the upper lobe consistent with an aspergilloma. He underwent bronchoscopy with BAL and cultures ultimately yielded a Saccharomyces species on fungal culture. His serum galactomannan was negative.  My and on voriconazole and axial has had radiographic improvement and  his x-ray findings. He also feels better symptomatically with no more hemoptysis improvement in his cough improvement in his energy though he still feels not nearly as well as he did prior to his hospitalization. He is avoiding exercised during the winter as it often precipitates coughing spells. He is only taking 200 mg twice a day of voriconazole rather than the 300 mg they would wish to be on we will recheck a serum level today.  Past Medical History  Diagnosis Date  . 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[place])   . MAI (mycobacterium avium-intracellulare) ([place])   . Lung disease, bullous ([place])   . Esophageal reflux   . Hypothyroidism     secondary to ablation for Graves disease  . Solar lentigo 08/08/2015   Past Surgical History  Procedure Laterality Date  . Left vats  2012    thoracotomy and LUL apical posterior segmentectomy  . Video bronchoscopy Bilateral 10/20/2015    Procedure: VIDEO BRONCHOSCOPY WITHOUT FLUORO;  Surgeon: Marshell Garfinkel, MD;  Location: [facility];  Service: Cardiopulmonary;  Laterality: Bilateral;   Family History  Problem Relation Age of Onset  . Adopted: Yes   Social History  Substance Use Topics  . Smoking status: Former Smoker -- 0.70 packs/day for 19 years    Types: Cigarettes    Quit date: 11/20/2007  . Smokeless tobacco: Former Systems developer    Types: Snuff    Quit date: 11/19/1994  . Alcohol Use: No    Allergies  Allergen Reactions  . Clarithromycin [Clarithromycin]     Adverse reaction w/ voriconazole  . Rifaximin [Rifaximin]  Adverse reaction w/ voriconazole    Current outpatient prescriptions:  .  albuterol (PROVENTIL HFA;VENTOLIN HFA) 108 (90 BASE) MCG/ACT inhaler, Inhale 2 puffs into the lungs every 6 (six) hours as needed., Disp: 1 Inhaler, Rfl: 4 .  alendronate (FOSAMAX) 70 MG tablet, Take 1 tablet (70 mg total) by mouth once a week. Take with a full glass of water on an empty stomach., Disp: 4 tablet, Rfl: 11 .  dextromethorphan-guaiFENesin (TUSSIN DM) 10-100  MG/5ML liquid, Take 10 mLs by mouth every 4 (four) hours as needed for cough., Disp: 118 mL, Rfl: 0 .  ethambutol (MYAMBUTOL) 400 MG tablet, TAKE TWO AND ONE-HALF TABLETS BY MOUTH ONCE DAILY, Disp: 225 tablet, Rfl: 5 .  ferrous sulfate (FERRO-BOB) 325 (65 FE) MG tablet, Take 325 mg by mouth daily.  , Disp: , Rfl:  .  Fluticasone Furoate-Vilanterol (BREO ELLIPTA) 100-25 MCG/INH AEPB, Take 1 puff by mouth daily., Disp: 60 each, Rfl: 5 .  levothyroxine (SYNTHROID, LEVOTHROID) 50 MCG tablet, Take 1 tablet by mouth daily., Disp: , Rfl: 0 .  predniSONE (DELTASONE) 20 MG tablet, Take 0.5 tablets (10 mg total) by mouth daily., Disp: 60 tablet, Rfl: 5 .  tiotropium (SPIRIVA HANDIHALER) 18 MCG inhalation capsule, Place 1 capsule (18 mcg total) into inhaler and inhale daily., Disp: 30 capsule, Rfl: 1 .  voriconazole (VFEND) 200 MG tablet, Take 1 tablet (200 mg total) by mouth 2 (two) times daily., Disp: 60 tablet, Rfl: 11 .  voriconazole (VFEND) 50 MG tablet, Take 2 tablets (100 mg total) by mouth 2 (two) times daily. Take with the 268m bid for 3042mbid, Disp: 120 tablet, Rfl: 11    Review of Systems  Constitutional: Negative for fever, chills, diaphoresis, activity change, appetite change, fatigue and unexpected weight change.  HENT: Negative for congestion, rhinorrhea, sinus pressure, sneezing, sore throat and trouble swallowing.   Eyes: Negative for photophobia and visual disturbance.  Respiratory: Positive for cough and shortness of breath. Negative for chest tightness, wheezing and stridor.   Cardiovascular: Negative for chest pain, palpitations and leg swelling.  Gastrointestinal: Negative for nausea, vomiting, abdominal pain, diarrhea, constipation, blood in stool, abdominal distention and anal bleeding.  Genitourinary: Negative for dysuria, hematuria, flank pain and difficulty urinating.  Musculoskeletal: Negative for myalgias, back pain, joint swelling, arthralgias and gait problem.  Skin:  Positive for rash. Negative for color change, pallor and wound.  Neurological: Negative for dizziness, tremors, weakness and light-headedness.  Hematological: Negative for adenopathy. Does not bruise/bleed easily.  Psychiatric/Behavioral: Negative for behavioral problems, confusion, sleep disturbance, dysphoric mood, decreased concentration and agitation.       Objective:   Physical Exam  Constitutional: He is oriented to person, place, and time. He appears well-nourished. No distress.  HENT:  Head: Normocephalic and atraumatic.  Eyes: Conjunctivae and EOM are normal. Pupils are equal, round, and reactive to light. No scleral icterus.  Neck: Normal range of motion. Neck supple.  Cardiovascular: Normal rate, regular rhythm and normal heart sounds.  Exam reveals no gallop and no friction rub.   No murmur heard. Pulmonary/Chest: No respiratory distress.  Prolonged expiratory phase  Abdominal: He exhibits no distension and no mass. There is no tenderness. There is no rebound and no guarding.  Musculoskeletal: He exhibits no edema or tenderness.  Lymphadenopathy:    He has no cervical adenopathy.  Neurological: He is alert and oriented to person, place, and time. He exhibits normal muscle tone. Coordination normal.  Skin: Skin is warm and dry. He is  not diaphoretic. No erythema. No pallor.     Psychiatric: He has a normal mood and affect. His behavior is normal. Judgment and thought content normal.          Assessment & Plan:   Aspergilloma: continue lifelong voriconazole and check VFEND level with CBC and  CMP today. XRay improved.   S. Cerevesiae: isolated on BAL: likely a contaminant rather than pathogen and voriconazole would still be drug of choice if we would to treat with oral agent  Allergic bronchopulmonary aspgergillosis: As above, lifelong voriconazole steroids per Pulmonary medicine.  Mycobacterium avium pulmonary infection: We have been reluctant to stop these  medications due to prior flares when he comes off the medication indications. Will reconsider in the future I see him back in 6 months time.   I spent greater than 25 minutes with the patient including greater than 50% of time in face to face counsel of the patient regarding his aspergilloma allergic bronchopulmonary aspergillosis and Mycobacterium avium infections and in coordination of their care.

## 2015-12-06 LAB — SEDIMENTATION RATE: SED RATE: 32 mm/h — AB (ref 0–15)

## 2015-12-08 LAB — VORICONAZOLE QUANT BY LC/MS: Voriconazole, Quant, by LC/MS: 3.8 ug/mL (ref <0.1)

## 2015-12-13 ENCOUNTER — Other Ambulatory Visit: Payer: Self-pay | Admitting: Infectious Disease

## 2016-01-11 ENCOUNTER — Encounter (HOSPITAL_COMMUNITY): Payer: Self-pay | Admitting: Pulmonary Disease

## 2016-01-16 ENCOUNTER — Other Ambulatory Visit (INDEPENDENT_AMBULATORY_CARE_PROVIDER_SITE_OTHER): Payer: BLUE CROSS/BLUE SHIELD

## 2016-01-16 ENCOUNTER — Encounter: Payer: Self-pay | Admitting: Internal Medicine

## 2016-01-16 ENCOUNTER — Ambulatory Visit (INDEPENDENT_AMBULATORY_CARE_PROVIDER_SITE_OTHER): Payer: BLUE CROSS/BLUE SHIELD | Admitting: Internal Medicine

## 2016-01-16 VITALS — BP 110/72 | HR 91 | Ht 69.0 in | Wt 129.0 lb

## 2016-01-16 DIAGNOSIS — B4481 Allergic bronchopulmonary aspergillosis: Secondary | ICD-10-CM

## 2016-01-16 DIAGNOSIS — J4550 Severe persistent asthma, uncomplicated: Secondary | ICD-10-CM | POA: Diagnosis not present

## 2016-01-16 LAB — CBC WITH DIFFERENTIAL/PLATELET
BASOS ABS: 0 10*3/uL (ref 0.0–0.1)
BASOS PCT: 0.1 % (ref 0.0–3.0)
EOS ABS: 0 10*3/uL (ref 0.0–0.7)
Eosinophils Relative: 0 % (ref 0.0–5.0)
HCT: 44.9 % (ref 39.0–52.0)
Hemoglobin: 15.7 g/dL (ref 13.0–17.0)
Lymphocytes Relative: 3.3 % — ABNORMAL LOW (ref 12.0–46.0)
Lymphs Abs: 0.4 10*3/uL — ABNORMAL LOW (ref 0.7–4.0)
MCHC: 34.9 g/dL (ref 30.0–36.0)
MCV: 88.3 fl (ref 78.0–100.0)
MONO ABS: 0.4 10*3/uL (ref 0.1–1.0)
Monocytes Relative: 3.3 % (ref 3.0–12.0)
NEUTROS ABS: 12.2 10*3/uL — AB (ref 1.4–7.7)
Neutrophils Relative %: 93.3 % — ABNORMAL HIGH (ref 43.0–77.0)
PLATELETS: 236 10*3/uL (ref 150.0–400.0)
RBC: 5.09 Mil/uL (ref 4.22–5.81)
RDW: 13.1 % (ref 11.5–15.5)
WBC: 13.1 10*3/uL — AB (ref 4.0–10.5)

## 2016-01-16 NOTE — Progress Notes (Signed)
Subjective:     Patient ID: [name], male   DOB: [birth year], 40 y.o.   MRN: [id number]  HPI   OV 01/16/2016  Chief Complaint  Patient presents with  . Follow-up    Pt denies any increased dyspnea at this time - states that it comes and goes with activity. Pt states it is different on a daily basis. Denies hemoptysis since last OV.   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 Since then he overall feels that his dyspnea is not back to baseline. He feels he has had a new lower baseline. He has more fatigued than usual. But he is stable without any exacerbation. He feels in the next few years ago started to become permanently disabled. He currently works as a Biomedical scientist. There are no fevers.   Last chest x-ray 11/07/2015: Shows hyperinflation personally visualized Last IgE January 2013 was 841 and significantly improved from 4000 2012. Last CBC January 2017 with normal used to flows are 100 cells per cubic millimeter.     has a past medical history of 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[place]); MAI (mycobacterium avium-intracellulare) ([place]); Lung disease, bullous ([place]); Esophageal reflux; Hypothyroidism; Solar lentigo (08/08/2015); and Mold exposure (12/05/2015).   reports that he quit smoking about 8 years ago. His smoking use included Cigarettes. He has a 13.3 pack-year smoking history. He quit smokeless tobacco use about 21 years ago. His smokeless tobacco use included Snuff.  Past Surgical History  Procedure Laterality Date  . Left vats  2012    thoracotomy and LUL apical posterior segmentectomy  . Video bronchoscopy Bilateral 10/20/2015    Procedure: VIDEO BRONCHOSCOPY WITHOUT FLUORO;  Surgeon: Marshell Garfinkel, MD;  Location: [facility];  Service: Cardiopulmonary;  Laterality: Bilateral;    Allergies  Allergen Reactions  . Clarithromycin [Clarithromycin]     Adverse reaction w/ voriconazole  .  Rifaximin [Rifaximin]     Adverse reaction w/ voriconazole    Immunization History  Administered Date(s) Administered  . Influenza Split 10/29/2011, 08/14/2012  . Influenza Whole 09/19/2010  . Influenza,inj,Quad PF,36+ Mos 11/23/2013, 09/28/2014, 09/06/2015  . Pneumococcal Polysaccharide-23 12/17/2010    Family History  Problem Relation Age of Onset  . Adopted: Yes     Current outpatient prescriptions:  .  albuterol (PROVENTIL HFA;VENTOLIN HFA) 108 (90 BASE) MCG/ACT inhaler, Inhale 2 puffs into the lungs every 6 (six) hours as needed., Disp: 1 Inhaler, Rfl: 4 .  alendronate (FOSAMAX) 70 MG tablet, Take 1 tablet (70 mg total) by mouth once a week. Take with a full glass of water on an empty stomach., Disp: 4 tablet, Rfl: 11 .  dextromethorphan-guaiFENesin (TUSSIN DM) 10-100 MG/5ML liquid, Take 10 mLs by mouth every 4 (four) hours as needed for cough., Disp: 118 mL, Rfl: 0 .  ethambutol (MYAMBUTOL) 400 MG tablet, TAKE TWO AND ONE-HALF TABLETS BY MOUTH ONCE DAILY, Disp: 225 tablet, Rfl: 5 .  ferrous sulfate (FERRO-BOB) 325 (65 FE) MG tablet, Take 325 mg by mouth daily.  , Disp: , Rfl:  .  Fluticasone Furoate-Vilanterol (BREO ELLIPTA) 100-25 MCG/INH AEPB, Take 1 puff by mouth daily., Disp: 60 each, Rfl: 5 .  levothyroxine (SYNTHROID, LEVOTHROID) 50 MCG tablet, Take 1 tablet by mouth daily., Disp: , Rfl: 0 .  predniSONE (DELTASONE) 20 MG tablet, Take 0.5 tablets (10 mg total) by mouth daily., Disp: 60 tablet, Rfl: 5 .  tiotropium (SPIRIVA HANDIHALER) 18 MCG inhalation capsule, Place  1 capsule (18 mcg total) into inhaler and inhale daily., Disp: 30 capsule, Rfl: 1 .  voriconazole (VFEND) 200 MG tablet, Take 1 tablet (200 mg total) by mouth 2 (two) times daily., Disp: 60 tablet, Rfl: 11     Review of Systems According to history of present illness    Objective:   Physical Exam  Constitutional: He is oriented to person, place, and time. He appears well-developed and well-nourished. No  distress.  Body mass index is 19.04 kg/(m^2).   HENT:  Head: Normocephalic and atraumatic.  Right Ear: External ear normal.  Left Ear: External ear normal.  Mouth/Throat: Oropharynx is clear and moist. No oropharyngeal exudate.  Eyes: Conjunctivae and EOM are normal. Pupils are equal, round, and reactive to light. Right eye exhibits no discharge. Left eye exhibits no discharge. No scleral icterus.  Neck: Normal range of motion. Neck supple. No JVD present. No tracheal deviation present. No thyromegaly present.  Cardiovascular: Normal rate, regular rhythm and intact distal pulses.  Exam reveals no gallop and no friction rub.   No murmur heard. Pulmonary/Chest: Effort normal and breath sounds normal. No respiratory distress. He has no wheezes. He has no rales. He exhibits no tenderness.  Left chest scar +  Abdominal: Soft. Bowel sounds are normal. He exhibits no distension and no mass. There is no tenderness. There is no rebound and no guarding.  Musculoskeletal: Normal range of motion. He exhibits no edema or tenderness.  Lymphadenopathy:    He has no cervical adenopathy.  Neurological: He is alert and oriented to person, place, and time. He has normal reflexes. No cranial nerve deficit. Coordination normal.  Skin: Skin is warm and dry. No rash noted. He is not diaphoretic. No erythema. No pallor.  Psychiatric: He has a normal mood and affect. His behavior is normal. Judgment and thought content normal.  Nursing note and vitals reviewed.   Filed Vitals:   01/16/16 1544  BP: 110/72  Pulse: 91  Height: _0  (1.753 m)  Weight: 129 lb (58.514 kg)  SpO2: 97%        Assessment:       ICD-9-CM ICD-10-CM   1. ABPA (allergic bronchopulmonary aspergillosis) (HCC) 518.6 B44.81 IgE     CBC w/Diff     Pulmonary function test  2. Severe persistent asthma, uncomplicated 161.09 U04.54 IgE     CBC w/Diff     Pulmonary function test       Plan:     Overall stable on MAI treatment,  voriconazole for ABPA and severe persistent asthma regimen of prednisone, and triple inhaler therapy. We will check a CBC with differential and also IgE to see if there would be option of adding either Nucala or Xolair   (> 50% of this 15 min visit spent in face to face counseling or/and coordination of care)   Dr. Brand Males, M.D., [facility].C.P Pulmonary and Critical Care Medicine Staff Physician [place] [facility] Pager: [phone], If no answer or between  15:00h - 7:00h: call [phone]  01/16/2016 4:12 PM

## 2016-01-16 NOTE — Patient Instructions (Addendum)
ICD-9-CM ICD-10-CM   1. ABPA (allergic bronchopulmonary aspergillosis) (HCC) 518.6 B44.81   2. Severe persistent asthma, uncomplicated 093.81 W29.93     Stable disesase  Plan  - Do total IgE and CBC with differential - we'll call you with the results to see if you might qualify for some injectable therapies =continue voriconazole, Brio, Spiriva as before and prednisone as before and also recently at all  Followup  - 6 months or sooner if needed - spirometry at the PFT lab at follow-up

## 2016-01-17 ENCOUNTER — Telehealth: Payer: Self-pay | Admitting: Internal Medicine

## 2016-01-17 LAB — IGE: IGE (IMMUNOGLOBULIN E), SERUM: 717 kU/L — AB (ref <115)

## 2016-01-17 NOTE — Telephone Encounter (Signed)
His IgE is 717 and improved from several years ago. STill is very high. Given his very severe asthma and symptoms we could try XOlair. See if we can get insurance or coverage for it. His dosing is likely 313m every 2 weeks but double check that. Get Katie welchel help if needed  Dr. MBrand Males M.D., [facility] Center[facility].P Pulmonary and Critical Care Medicine Staff Physician [place] Pager: [phone] If no answer or between  15:00h - 7:00h: call [phone]  01/17/2016 5:27 PM

## 2016-01-18 NOTE — Telephone Encounter (Signed)
Called and spoke to pt. Informed him of the results and recs per MR. Pt is wanting to move forward with starting Xolair. Pt aware he will be contact again to discuss Xolair in more detail.   Katie please advise. Thanks.

## 2016-01-19 NOTE — Telephone Encounter (Signed)
Spoke with patient- he will come by the office Friday 01-20-16 at 9:00am to go over [place] paperwork.

## 2016-01-20 NOTE — Telephone Encounter (Signed)
Pt came by and signed paperwork to start Xolair process. I will have MR sign forms when he returns in office and keep patient updated from there.

## 2016-02-23 ENCOUNTER — Telehealth: Payer: Self-pay | Admitting: Internal Medicine

## 2016-02-23 MED ORDER — ALBUTEROL SULFATE HFA 108 (90 BASE) MCG/ACT IN AERS: 2.0000 | INHALATION_SPRAY | Freq: Four times a day (QID) | RESPIRATORY_TRACT | Status: DC | PRN

## 2016-02-23 NOTE — Telephone Encounter (Signed)
Pt is requesting a refill of Albuterol HFA. Rx sent. Nothing further needed.

## 2016-03-12 ENCOUNTER — Telehealth: Payer: Self-pay | Admitting: Pulmonary Disease

## 2016-03-13 NOTE — Telephone Encounter (Signed)
[name] has been notified that we are going through [facility]-PA to be started.

## 2016-03-19 DIAGNOSIS — L989 Disorder of the skin and subcutaneous tissue, unspecified: Secondary | ICD-10-CM | POA: Diagnosis not present

## 2016-03-19 DIAGNOSIS — M8080XS Other osteoporosis with current pathological fracture, unspecified site, sequela: Secondary | ICD-10-CM | POA: Diagnosis not present

## 2016-03-19 DIAGNOSIS — E034 Atrophy of thyroid (acquired): Secondary | ICD-10-CM | POA: Diagnosis not present

## 2016-03-19 DIAGNOSIS — D508 Other iron deficiency anemias: Secondary | ICD-10-CM | POA: Diagnosis not present

## 2016-03-19 DIAGNOSIS — E039 Hypothyroidism, unspecified: Secondary | ICD-10-CM | POA: Diagnosis not present

## 2016-03-19 DIAGNOSIS — K219 Gastro-esophageal reflux disease without esophagitis: Secondary | ICD-10-CM | POA: Diagnosis not present

## 2016-03-20 NOTE — Telephone Encounter (Signed)
Received PA approval-patient is approved from 03-17-15 through 03-16-16 for 26 visits for Xolair. Approval # [id number] through [facility]. Both [employer] and patient are aware of approval. [name] is sending Rx to [facility] pharmacy-the pharmacy will contact patient to set up billing arrangements, patient is aware to use the [place] card assistance program to help cover cost-pt is aware of how to obtain the funds to do so and no further questions at this time. Pt is aware once [facility] has spoken with him they will call our office to schedule shipment of [place].     Pt also aware that I will contact him to set up appt for first injections here once Xolair has arrived in office and that he must pick up and bring his EPIPEN with him to the visit. Nothing more needed at this time.

## 2016-04-02 ENCOUNTER — Other Ambulatory Visit: Payer: Self-pay | Admitting: Internal Medicine

## 2016-04-03 ENCOUNTER — Other Ambulatory Visit: Payer: Self-pay | Admitting: Infectious Disease

## 2016-04-03 DIAGNOSIS — A310 Pulmonary mycobacterial infection: Secondary | ICD-10-CM

## 2016-04-03 DIAGNOSIS — B4481 Allergic bronchopulmonary aspergillosis: Secondary | ICD-10-CM

## 2016-04-03 MED ORDER — VORICONAZOLE 200 MG PO TABS: 200.0000 mg | ORAL_TABLET | Freq: Two times a day (BID) | ORAL | Status: DC

## 2016-04-03 NOTE — Telephone Encounter (Signed)
TS will set up date for shipment; I will contact the patient to set up START date once Xolair has arrived to office.

## 2016-04-03 NOTE — Telephone Encounter (Signed)
Patient called and states that we need to call [facility] regarding Xolair injection.-prm

## 2016-04-03 NOTE — Telephone Encounter (Signed)
Routed to [facility] for follow up.

## 2016-04-03 NOTE — Telephone Encounter (Signed)
Attempted to schedule xolair delivery, but pt has not yet started xolair tx and I'm not familiar with initiating new starts for xolair. Katie please advise.  Thanks!

## 2016-04-04 NOTE — Telephone Encounter (Signed)
#  vials:6 Ordered date:04/04/16 Shipping Date:04/05/16

## 2016-04-05 NOTE — Telephone Encounter (Signed)
Routing to [place] for follow up.

## 2016-04-05 NOTE — Telephone Encounter (Signed)
Will discuss with patient on Friday-I do not send Epipen until we have start date for Xolair.

## 2016-04-05 NOTE — Telephone Encounter (Signed)
Patient calling regarding delivery of Xolair advised that it was ordered and should be shipped today. He is questioning whether his Epipen will be sent along with [name]

## 2016-04-06 ENCOUNTER — Telehealth: Payer: Self-pay | Admitting: Pulmonary Disease

## 2016-04-06 NOTE — Telephone Encounter (Signed)
#  vials:4 Ordered date:04/13/16 Shipping Date:04/18/16 This info wasn't put in b/c I was getting ready to do it, I got busy and forgot to come back to it.  Xolair should have been del. 04/06/16, pharm. Needs more  info. From pt.. I called him and lmom to [facility] So he could give them the info. They need. Then I hope they'll call back to set up shipment.  Leaving encounter open to put xolair in when it comes in.  # Vials:6 Arrival Date:04/18/16 Lot [id number] Exp Date:12/20

## 2016-04-06 NOTE — Telephone Encounter (Signed)
Pt is aware his Xolair is scheduled to arrive 04-10-16 in our office. Once we physically have the medication in office I will contact him to schedule First Xolair injection and send Epipen Rx at that time. Nothing more needed at this time.

## 2016-04-09 ENCOUNTER — Telehealth: Payer: Self-pay | Admitting: Pulmonary Disease

## 2016-04-09 ENCOUNTER — Ambulatory Visit
Admission: RE | Admit: 2016-04-09 | Discharge: 2016-04-09 | Disposition: A | Discharge status: Home / Self Care | Payer: BLUE CROSS/BLUE SHIELD | Source: Ambulatory Visit | Attending: Infectious Disease | Admitting: Infectious Disease

## 2016-04-09 ENCOUNTER — Ambulatory Visit (INDEPENDENT_AMBULATORY_CARE_PROVIDER_SITE_OTHER): Payer: BLUE CROSS/BLUE SHIELD | Admitting: Infectious Disease

## 2016-04-09 VITALS — BP 122/88 | HR 80 | Temp 98.0°F | Wt 124.0 lb

## 2016-04-09 DIAGNOSIS — J4540 Moderate persistent asthma, uncomplicated: Secondary | ICD-10-CM | POA: Diagnosis not present

## 2016-04-09 DIAGNOSIS — R05 Cough: Secondary | ICD-10-CM | POA: Diagnosis not present

## 2016-04-09 DIAGNOSIS — L814 Other melanin hyperpigmentation: Secondary | ICD-10-CM | POA: Diagnosis not present

## 2016-04-09 DIAGNOSIS — B4481 Allergic bronchopulmonary aspergillosis: Secondary | ICD-10-CM

## 2016-04-09 DIAGNOSIS — A310 Pulmonary mycobacterial infection: Secondary | ICD-10-CM

## 2016-04-09 DIAGNOSIS — D492 Neoplasm of unspecified behavior of bone, soft tissue, and skin: Secondary | ICD-10-CM | POA: Diagnosis not present

## 2016-04-09 DIAGNOSIS — B449 Aspergillosis, unspecified: Secondary | ICD-10-CM

## 2016-04-09 DIAGNOSIS — B440 Invasive pulmonary aspergillosis: Secondary | ICD-10-CM | POA: Diagnosis not present

## 2016-04-09 DIAGNOSIS — J449 Chronic obstructive pulmonary disease, unspecified: Secondary | ICD-10-CM

## 2016-04-09 DIAGNOSIS — L820 Inflamed seborrheic keratosis: Secondary | ICD-10-CM | POA: Diagnosis not present

## 2016-04-09 LAB — CBC WITH DIFFERENTIAL/PLATELET
Basophils Absolute: 0 cells/uL (ref 0–200)
Basophils Relative: 0 %
Eosinophils Absolute: 196 cells/uL (ref 15–500)
Eosinophils Relative: 2 %
HCT: 45.8 % (ref 38.5–50.0)
Hemoglobin: 15.6 g/dL (ref 13.2–17.1)
Lymphocytes Relative: 14 %
Lymphs Abs: 1372 cells/uL (ref 850–3900)
MCH: 30.4 pg (ref 27.0–33.0)
MCHC: 34.1 g/dL (ref 32.0–36.0)
MCV: 89.3 fL (ref 80.0–100.0)
MPV: 8.8 fL (ref 7.5–12.5)
Monocytes Absolute: 686 cells/uL (ref 200–950)
Monocytes Relative: 7 %
Neutro Abs: 7546 cells/uL (ref 1500–7800)
Neutrophils Relative %: 77 %
Platelets: 209 10*3/uL (ref 140–400)
RBC: 5.13 MIL/uL (ref 4.20–5.80)
RDW: 13.9 % (ref 11.0–15.0)
WBC: 9.8 10*3/uL (ref 3.8–10.8)

## 2016-04-09 LAB — COMPLETE METABOLIC PANEL WITH GFR
ALT: 9 U/L (ref 9–46)
AST: 16 U/L (ref 10–40)
Albumin: 3.6 g/dL (ref 3.6–5.1)
Alkaline Phosphatase: 86 U/L (ref 40–115)
BILIRUBIN TOTAL: 1 mg/dL (ref 0.2–1.2)
BUN: 8 mg/dL (ref 7–25)
CO2: 29 mmol/L (ref 20–31)
CREATININE: 0.77 mg/dL (ref 0.60–1.35)
Calcium: 8.5 mg/dL — ABNORMAL LOW (ref 8.6–10.3)
Chloride: 99 mmol/L (ref 98–110)
GFR, Est Non African American: >89 mL/min (ref >=60)
Glucose, Bld: 69 mg/dL (ref 65–99)
Potassium: 4.1 mmol/L (ref 3.5–5.3)
Sodium: 138 mmol/L (ref 135–146)
TOTAL PROTEIN: 6.3 g/dL (ref 6.1–8.1)

## 2016-04-09 MED ORDER — AZITHROMYCIN 500 MG PO TABS: 500.0000 mg | ORAL_TABLET | Freq: Every day | ORAL | Status: DC

## 2016-04-09 MED ORDER — VORICONAZOLE 200 MG PO TABS: 200.0000 mg | ORAL_TABLET | Freq: Two times a day (BID) | ORAL | Status: DC

## 2016-04-09 MED ORDER — ETHAMBUTOL HCL 400 MG PO TABS: ORAL_TABLET | ORAL | Status: DC

## 2016-04-09 NOTE — Telephone Encounter (Signed)
Pt was explained to in previous message that I would contact him once the medication is in our office to set up First Injection and Epipen Rx.    LMTCB. I will also contact patient again tomorrow afternoon.

## 2016-04-09 NOTE — Progress Notes (Signed)
Subjective:  Chief complaint: cough with exercise in particular  : Patient ID: [name], male    DOB: Mar 17, 1976, 40 y.o.   MRN: [id number]  HPI   40 year-old Asian with history of Mycobacterium avium infection also with history history of aspergilloma status post resection by cardiothoracic surgery then found to have what appears to be allergic bronchopulmonary aspergillosis Spring 2012 and restarted on voriconazole and steroids, then found to have apparent new aspergilloma. We had taken him off his Mycobacterium avium drugs and then he developed fevers chills and malaise  Spring (2013) and was seen by my partner Dr. Megan Salon in late April  His AFB smears and cultures obtained in the clinic on that day prior to the initiation of azithromycin ethambutol ultimately failed to grow Mycobacterium avium. He had been doing well I saw him in [place] 2013 we discontinued his azithromycin and ethambutol again. Unfortunately he again shortly thereafter developed fevers cough malaise. He started back on azithromycin ethambutol. Cultures done for AFB here were again negative. The patient however felt dramatically better having been back on azithromycin and ethambutol.   I last saw him in January of 2015 when he was feeling relatively well.  He did have an episode of coughing up a few flecks of blood in January 2016 which prompted appt with [facility] but by time he made appt this had long since resolved. Was only a one time episode none since then.  I saw again in March and then in September 2016. In summer he was hospitalized with hemoptysis and found to have an area in the upper lobe consistent with an aspergilloma. He underwent bronchoscopy with BAL and cultures ultimately yielded a Saccharomyces species on fungal culture. His serum galactomannan was negative.  He has been  on voriconazole and axial has had radiographic improvement and his x-ray findings. He also feelt etter symptomatically with no more  hemoptysis improvement in his cough improvement in his energy though he still feels not nearly as well as he did prior to his hospitalization. He is avoiding exercised during the winter as it often precipitates coughing spells. He was  only taking 200 mg twice a day of voriconazole rather than the 300 mg he has continued this along with his ethambutol and azithromycin. He continues to have cough more so with exercise and has not been exercising at "[employer]" due to the fact this elicits more coughing. He has not smoked since 2007. He is seeing [name] and is being considered for new drug infusion to treat his COPD.  Past Medical History  Diagnosis Date  . 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[place])   . MAI (mycobacterium avium-intracellulare) ([place])   . Lung disease, bullous ([place])   . Esophageal reflux   . Hypothyroidism     secondary to ablation for Graves disease  . Solar lentigo 08/08/2015  . Mold exposure 12/05/2015   Past Surgical History  Procedure Laterality Date  . Left vats  2012    thoracotomy and LUL apical posterior segmentectomy  . Video bronchoscopy Bilateral 10/20/2015    Procedure: VIDEO BRONCHOSCOPY WITHOUT FLUORO;  Surgeon: Marshell Garfinkel, MD;  Location: [facility];  Service: Cardiopulmonary;  Laterality: Bilateral;   Family History  Problem Relation Age of Onset  . Adopted: Yes   Social History  Substance Use Topics  . Smoking status: Former Smoker -- 0.70 packs/day for 19 years    Types: Cigarettes    Quit date: 11/20/2007  . Smokeless tobacco: Former Systems developer  Types: Snuff    Quit date: 11/19/1994  . Alcohol Use: No    Allergies  Allergen Reactions  . Clarithromycin [Clarithromycin]     Adverse reaction w/ voriconazole  . Rifaximin [Rifaximin]     Adverse reaction w/ voriconazole    Current outpatient prescriptions:  .  albuterol (PROVENTIL HFA;VENTOLIN HFA) 108 (90 Base) MCG/ACT inhaler, Inhale 2 puffs into the lungs every 6 (six) hours as needed., Disp: 1 Inhaler, Rfl:  4 .  alendronate (FOSAMAX) 70 MG tablet, Take 1 tablet (70 mg total) by mouth once a week. Take with a full glass of water on an empty stomach., Disp: 4 tablet, Rfl: 11 .  azithromycin (ZITHROMAX) 500 MG tablet, Take 1 tablet (500 mg total) by mouth daily., Disp: 90 tablet, Rfl: 4 .  dextromethorphan-guaiFENesin (TUSSIN DM) 10-100 MG/5ML liquid, Take 10 mLs by mouth every 4 (four) hours as needed for cough., Disp: 118 mL, Rfl: 0 .  ethambutol (MYAMBUTOL) 400 MG tablet, TAKE TWO AND ONE-HALF TABLETS BY MOUTH ONCE DAILY, Disp: 225 tablet, Rfl: 5 .  ferrous sulfate (FERRO-BOB) 325 (65 FE) MG tablet, Take 325 mg by mouth daily.  , Disp: , Rfl:  .  Fluticasone Furoate-Vilanterol (BREO ELLIPTA) 100-25 MCG/INH AEPB, Take 1 puff by mouth daily., Disp: 60 each, Rfl: 5 .  levothyroxine (SYNTHROID, LEVOTHROID) 50 MCG tablet, Take 1 tablet by mouth daily., Disp: , Rfl: 0 .  predniSONE (DELTASONE) 20 MG tablet, Take 0.5 tablets (10 mg total) by mouth daily., Disp: 60 tablet, Rfl: 5 .  tiotropium (SPIRIVA HANDIHALER) 18 MCG inhalation capsule, Place 1 capsule (18 mcg total) into inhaler and inhale daily., Disp: 30 capsule, Rfl: 1 .  voriconazole (VFEND) 200 MG tablet, Take 1 tablet (200 mg total) by mouth 2 (two) times daily., Disp: 60 tablet, Rfl: 11    Review of Systems  Constitutional: Negative for fever, chills, diaphoresis, activity change, appetite change, fatigue and unexpected weight change.  HENT: Negative for congestion, rhinorrhea, sinus pressure, sneezing, sore throat and trouble swallowing.   Eyes: Negative for photophobia and visual disturbance.  Respiratory: Positive for cough and shortness of breath. Negative for chest tightness, wheezing and stridor.   Cardiovascular: Negative for chest pain, palpitations and leg swelling.  Gastrointestinal: Negative for nausea, vomiting, abdominal pain, diarrhea, constipation, blood in stool, abdominal distention and anal bleeding.  Genitourinary: Negative  for dysuria, hematuria, flank pain and difficulty urinating.  Musculoskeletal: Negative for myalgias, back pain, joint swelling, arthralgias and gait problem.  Skin: Positive for rash. Negative for color change, pallor and wound.  Neurological: Negative for dizziness, tremors, weakness and light-headedness.  Hematological: Negative for adenopathy. Does not bruise/bleed easily.  Psychiatric/Behavioral: Negative for behavioral problems, confusion, sleep disturbance, dysphoric mood, decreased concentration and agitation.       Objective:   Physical Exam  Constitutional: He is oriented to person, place, and time. He appears well-nourished. No distress.  HENT:  Head: Normocephalic and atraumatic.  Eyes: Conjunctivae and EOM are normal. Pupils are equal, round, and reactive to light. No scleral icterus.  Neck: Normal range of motion. Neck supple.  Cardiovascular: Normal rate, regular rhythm and normal heart sounds.  Exam reveals no gallop and no friction rub.   No murmur heard. Pulmonary/Chest: No respiratory distress. He has decreased breath sounds in the right lower field.  Prolonged expiratory phase  Abdominal: He exhibits no distension and no mass. There is no tenderness. There is no rebound and no guarding.  Musculoskeletal: He exhibits no edema or  tenderness.  Lymphadenopathy:    He has no cervical adenopathy.  Neurological: He is alert and oriented to person, place, and time. He exhibits normal muscle tone. Coordination normal.  Skin: Skin is warm and dry. He is not diaphoretic. No erythema. No pallor.     Psychiatric: He has a normal mood and affect. His behavior is normal. Judgment and thought content normal.          Assessment & Plan:   Aspergilloma: continue lifelong voriconazole and check VFEND level with CBC and  CMP today. XRay repeated and is stable   Allergic bronchopulmonary aspgergillosis: As above, lifelong voriconazole steroids per Pulmonary  medicine.  Mycobacterium avium pulmonary infection: We have been reluctant to stop these medications due to prior flares when he comes off the medication indications. He is not interested in stopping them at all  I spent greater than 40  minutes with the patient including greater than 50% of time in face to face counsel of the patient regarding his aspergilloma allergic bronchopulmonary aspergillosis and Mycobacterium avium infections and in coordination of his care.

## 2016-04-10 ENCOUNTER — Other Ambulatory Visit: Payer: Self-pay | Admitting: *Deleted

## 2016-04-10 DIAGNOSIS — L814 Other melanin hyperpigmentation: Secondary | ICD-10-CM | POA: Diagnosis not present

## 2016-04-10 DIAGNOSIS — L820 Inflamed seborrheic keratosis: Secondary | ICD-10-CM | POA: Diagnosis not present

## 2016-04-10 DIAGNOSIS — D492 Neoplasm of unspecified behavior of bone, soft tissue, and skin: Secondary | ICD-10-CM | POA: Diagnosis not present

## 2016-04-10 DIAGNOSIS — A310 Pulmonary mycobacterial infection: Secondary | ICD-10-CM

## 2016-04-10 DIAGNOSIS — B4481 Allergic bronchopulmonary aspergillosis: Secondary | ICD-10-CM

## 2016-04-10 MED ORDER — AZITHROMYCIN 500 MG PO TABS: 500.0000 mg | ORAL_TABLET | Freq: Every day | ORAL | Status: DC

## 2016-04-10 MED ORDER — ETHAMBUTOL HCL 400 MG PO TABS: ORAL_TABLET | ORAL | Status: DC

## 2016-04-10 NOTE — Telephone Encounter (Signed)
[phone], pt cb

## 2016-04-10 NOTE — Telephone Encounter (Signed)
LMTCB-patient's Xolair did NOT come in today-will contact him once Xolair is in our office to schedule first injection, review Xolair protocol again, and send Epipen Rx.

## 2016-04-13 NOTE — Telephone Encounter (Signed)
Tammy from [facility] calling to schedule a shipping date for Xolair, CB is [phone].

## 2016-04-13 NOTE — Telephone Encounter (Signed)
Will have Tammy Scott call [facility] to find out where they shipped Xolair to or what the hold up is.

## 2016-04-13 NOTE — Telephone Encounter (Signed)
Routing to [place] for follow up.

## 2016-04-13 NOTE — Telephone Encounter (Signed)
Called pharm. Today, they're sending xolair 04/18/16. Nothing further needed. Closing.

## 2016-04-19 ENCOUNTER — Telehealth: Payer: Self-pay | Admitting: Pulmonary Disease

## 2016-04-20 MED ORDER — EPINEPHRINE 0.3 MG/0.3ML IJ SOAJ: 0.3000 mg | Freq: Once | INTRAMUSCULAR | Status: DC

## 2016-04-20 NOTE — Telephone Encounter (Signed)
Spoke with patient-aware of Xolair in office and has scheduled first injections on Monday 04-23-16 at 2:45pm. Pt is aware of 2 hour wait and to bring EPIPEN to visit. Epipen Rx has been sent to [facility]. Nothing more needed at this time.

## 2016-04-23 ENCOUNTER — Ambulatory Visit (INDEPENDENT_AMBULATORY_CARE_PROVIDER_SITE_OTHER): Payer: BLUE CROSS/BLUE SHIELD

## 2016-04-23 DIAGNOSIS — J4540 Moderate persistent asthma, uncomplicated: Secondary | ICD-10-CM | POA: Diagnosis not present

## 2016-04-24 ENCOUNTER — Telehealth: Payer: Self-pay | Admitting: Internal Medicine

## 2016-04-24 MED ORDER — OMALIZUMAB 150 MG ~~LOC~~ SOLR
375.0000 mg | Freq: Once | SUBCUTANEOUS | Status: AC
  Administered 2016-04-23: 375 mg via SUBCUTANEOUS

## 2016-04-24 NOTE — Telephone Encounter (Signed)
Pt wanted to know if he had to contact his specialty pharmacy for refills of Xolair. He is aware that our office will contact his pharmacy for each shipment of [place]. Nothing more needed at this time.

## 2016-05-07 ENCOUNTER — Ambulatory Visit (INDEPENDENT_AMBULATORY_CARE_PROVIDER_SITE_OTHER): Payer: BLUE CROSS/BLUE SHIELD

## 2016-05-07 DIAGNOSIS — J4540 Moderate persistent asthma, uncomplicated: Secondary | ICD-10-CM | POA: Diagnosis not present

## 2016-05-07 MED ORDER — OMALIZUMAB 150 MG ~~LOC~~ SOLR
375.0000 mg | Freq: Once | SUBCUTANEOUS | Status: AC
  Administered 2016-05-07: 375 mg via SUBCUTANEOUS

## 2016-05-09 ENCOUNTER — Other Ambulatory Visit: Payer: Self-pay | Admitting: Internal Medicine

## 2016-05-09 ENCOUNTER — Telehealth: Payer: Self-pay | Admitting: Internal Medicine

## 2016-05-09 NOTE — Telephone Encounter (Signed)
#  vials:6 Ordered date:05/09/16 Shipping Date:05/16/16

## 2016-05-10 ENCOUNTER — Telehealth: Payer: Self-pay | Admitting: Internal Medicine

## 2016-05-10 NOTE — Telephone Encounter (Signed)
Patient calling to get status of refill request on Ventolin HFA. Patient notified that prescription was refilled today. Nothing further needed.

## 2016-05-14 DIAGNOSIS — E0500 Thyrotoxicosis with diffuse goiter without thyrotoxic crisis or storm: Secondary | ICD-10-CM | POA: Diagnosis not present

## 2016-05-14 DIAGNOSIS — B4481 Allergic bronchopulmonary aspergillosis: Secondary | ICD-10-CM | POA: Diagnosis not present

## 2016-05-14 DIAGNOSIS — E039 Hypothyroidism, unspecified: Secondary | ICD-10-CM | POA: Diagnosis not present

## 2016-05-14 DIAGNOSIS — J4540 Moderate persistent asthma, uncomplicated: Secondary | ICD-10-CM | POA: Diagnosis not present

## 2016-05-14 DIAGNOSIS — B449 Aspergillosis, unspecified: Secondary | ICD-10-CM | POA: Diagnosis not present

## 2016-05-14 DIAGNOSIS — M25532 Pain in left wrist: Secondary | ICD-10-CM | POA: Diagnosis not present

## 2016-05-14 DIAGNOSIS — K219 Gastro-esophageal reflux disease without esophagitis: Secondary | ICD-10-CM | POA: Diagnosis not present

## 2016-05-14 DIAGNOSIS — A310 Pulmonary mycobacterial infection: Secondary | ICD-10-CM | POA: Diagnosis not present

## 2016-05-17 NOTE — Telephone Encounter (Signed)
#  Vials:6 Arrival Date:05/17/16 Lot #[id number] Exp Date:12/20

## 2016-05-21 ENCOUNTER — Ambulatory Visit (INDEPENDENT_AMBULATORY_CARE_PROVIDER_SITE_OTHER): Payer: BLUE CROSS/BLUE SHIELD

## 2016-05-21 DIAGNOSIS — J4540 Moderate persistent asthma, uncomplicated: Secondary | ICD-10-CM | POA: Diagnosis not present

## 2016-05-21 MED ORDER — OMALIZUMAB 150 MG ~~LOC~~ SOLR
375.0000 mg | Freq: Once | SUBCUTANEOUS | Status: AC
  Administered 2016-05-21: 375 mg via SUBCUTANEOUS

## 2016-05-25 DIAGNOSIS — B449 Aspergillosis, unspecified: Secondary | ICD-10-CM | POA: Diagnosis not present

## 2016-05-25 DIAGNOSIS — B4481 Allergic bronchopulmonary aspergillosis: Secondary | ICD-10-CM | POA: Diagnosis not present

## 2016-05-25 DIAGNOSIS — A310 Pulmonary mycobacterial infection: Secondary | ICD-10-CM | POA: Diagnosis not present

## 2016-05-25 DIAGNOSIS — J4540 Moderate persistent asthma, uncomplicated: Secondary | ICD-10-CM | POA: Diagnosis not present

## 2016-05-31 DIAGNOSIS — E039 Hypothyroidism, unspecified: Secondary | ICD-10-CM | POA: Diagnosis not present

## 2016-05-31 DIAGNOSIS — K219 Gastro-esophageal reflux disease without esophagitis: Secondary | ICD-10-CM | POA: Diagnosis not present

## 2016-05-31 DIAGNOSIS — M25532 Pain in left wrist: Secondary | ICD-10-CM | POA: Diagnosis not present

## 2016-05-31 DIAGNOSIS — M19032 Primary osteoarthritis, left wrist: Secondary | ICD-10-CM | POA: Diagnosis not present

## 2016-05-31 DIAGNOSIS — E0500 Thyrotoxicosis with diffuse goiter without thyrotoxic crisis or storm: Secondary | ICD-10-CM | POA: Diagnosis not present

## 2016-06-04 ENCOUNTER — Ambulatory Visit (INDEPENDENT_AMBULATORY_CARE_PROVIDER_SITE_OTHER): Payer: BLUE CROSS/BLUE SHIELD

## 2016-06-04 DIAGNOSIS — J4540 Moderate persistent asthma, uncomplicated: Secondary | ICD-10-CM | POA: Diagnosis not present

## 2016-06-04 MED ORDER — OMALIZUMAB 150 MG ~~LOC~~ SOLR
375.0000 mg | Freq: Once | SUBCUTANEOUS | Status: AC
  Administered 2016-06-04: 375 mg via SUBCUTANEOUS

## 2016-06-06 ENCOUNTER — Telehealth: Payer: Self-pay | Admitting: Internal Medicine

## 2016-06-06 NOTE — Telephone Encounter (Addendum)
#  vials:6 Ordered date:06/06/16 Shipping Date:06/12/16 Med. Did not come in, pharm. Didn't order it.  New Ship Date: 06/13/16

## 2016-06-13 DIAGNOSIS — A310 Pulmonary mycobacterial infection: Secondary | ICD-10-CM | POA: Diagnosis not present

## 2016-06-13 DIAGNOSIS — J4540 Moderate persistent asthma, uncomplicated: Secondary | ICD-10-CM | POA: Diagnosis not present

## 2016-06-13 DIAGNOSIS — B4481 Allergic bronchopulmonary aspergillosis: Secondary | ICD-10-CM | POA: Diagnosis not present

## 2016-06-13 DIAGNOSIS — B449 Aspergillosis, unspecified: Secondary | ICD-10-CM | POA: Diagnosis not present

## 2016-06-14 NOTE — Telephone Encounter (Signed)
#  Vials:6 Arrival Date:06/14/16 Lot #[id number] Exp Date:12/20

## 2016-06-18 ENCOUNTER — Ambulatory Visit (INDEPENDENT_AMBULATORY_CARE_PROVIDER_SITE_OTHER): Payer: BLUE CROSS/BLUE SHIELD

## 2016-06-18 DIAGNOSIS — J4540 Moderate persistent asthma, uncomplicated: Secondary | ICD-10-CM | POA: Diagnosis not present

## 2016-06-18 MED ORDER — OMALIZUMAB 150 MG ~~LOC~~ SOLR
375.0000 mg | SUBCUTANEOUS | Status: DC
  Administered 2016-06-18: 375 mg via SUBCUTANEOUS

## 2016-06-19 MED ORDER — OMALIZUMAB 150 MG ~~LOC~~ SOLR
375.0000 mg | SUBCUTANEOUS | Status: DC
  Administered 2016-06-04: 375 mg via SUBCUTANEOUS

## 2016-06-19 NOTE — Progress Notes (Signed)
06/04/16 @ 12:11pm documented in error. New documentation done 06/19/16 for correct charges.

## 2016-06-19 NOTE — Addendum Note (Signed)
Addended by: Beckie Busing on: 06/19/2016 02:53 PM   Modules accepted: Orders

## 2016-06-26 NOTE — Progress Notes (Signed)
Pt's Xolair supplied via [facility].

## 2016-07-02 ENCOUNTER — Ambulatory Visit: Payer: BLUE CROSS/BLUE SHIELD

## 2016-07-02 ENCOUNTER — Ambulatory Visit (INDEPENDENT_AMBULATORY_CARE_PROVIDER_SITE_OTHER): Payer: BLUE CROSS/BLUE SHIELD

## 2016-07-02 ENCOUNTER — Telehealth: Payer: Self-pay | Admitting: *Deleted

## 2016-07-02 DIAGNOSIS — J4540 Moderate persistent asthma, uncomplicated: Secondary | ICD-10-CM

## 2016-07-02 MED ORDER — OMALIZUMAB 150 MG ~~LOC~~ SOLR
375.0000 mg | SUBCUTANEOUS | Status: DC
  Administered 2016-07-02: 375 mg via SUBCUTANEOUS

## 2016-07-02 NOTE — Telephone Encounter (Signed)
Completed PA for voriconazole (VFEND) 200 MG tablet via CoverMyMeds.  May take up to 72 hours for a response.

## 2016-07-02 NOTE — Telephone Encounter (Signed)
Excellent!

## 2016-07-05 ENCOUNTER — Telehealth: Payer: Self-pay | Admitting: Internal Medicine

## 2016-07-05 NOTE — Telephone Encounter (Signed)
#  vials:6 Ordered date:07/05/16 Shipping Date:07/11/16

## 2016-07-09 NOTE — Telephone Encounter (Signed)
PA approved, notified pharmacy.

## 2016-07-11 DIAGNOSIS — W19XXXA Unspecified fall, initial encounter: Secondary | ICD-10-CM | POA: Diagnosis not present

## 2016-07-11 DIAGNOSIS — W108XXA Fall (on) (from) other stairs and steps, initial encounter: Secondary | ICD-10-CM | POA: Diagnosis not present

## 2016-07-11 DIAGNOSIS — R0781 Pleurodynia: Secondary | ICD-10-CM | POA: Diagnosis not present

## 2016-07-11 DIAGNOSIS — B449 Aspergillosis, unspecified: Secondary | ICD-10-CM | POA: Diagnosis not present

## 2016-07-11 DIAGNOSIS — B4481 Allergic bronchopulmonary aspergillosis: Secondary | ICD-10-CM | POA: Diagnosis not present

## 2016-07-11 DIAGNOSIS — A310 Pulmonary mycobacterial infection: Secondary | ICD-10-CM | POA: Diagnosis not present

## 2016-07-11 DIAGNOSIS — J4540 Moderate persistent asthma, uncomplicated: Secondary | ICD-10-CM | POA: Diagnosis not present

## 2016-07-11 DIAGNOSIS — S2232XA Fracture of one rib, left side, initial encounter for closed fracture: Secondary | ICD-10-CM | POA: Diagnosis not present

## 2016-07-12 NOTE — Telephone Encounter (Signed)
#  Vials:6 Arrival Date:07/12/16 Lot #[id number] Exp Date:3/21

## 2016-07-16 ENCOUNTER — Ambulatory Visit (INDEPENDENT_AMBULATORY_CARE_PROVIDER_SITE_OTHER): Payer: BLUE CROSS/BLUE SHIELD

## 2016-07-16 DIAGNOSIS — J4550 Severe persistent asthma, uncomplicated: Secondary | ICD-10-CM | POA: Diagnosis not present

## 2016-07-16 MED ORDER — OMALIZUMAB 150 MG ~~LOC~~ SOLR
375.0000 mg | Freq: Once | SUBCUTANEOUS | Status: AC
  Administered 2016-07-16: 375 mg via SUBCUTANEOUS

## 2016-07-19 DIAGNOSIS — S2242XA Multiple fractures of ribs, left side, initial encounter for closed fracture: Secondary | ICD-10-CM | POA: Diagnosis not present

## 2016-07-19 DIAGNOSIS — W108XXA Fall (on) (from) other stairs and steps, initial encounter: Secondary | ICD-10-CM | POA: Diagnosis not present

## 2016-07-19 DIAGNOSIS — R0781 Pleurodynia: Secondary | ICD-10-CM | POA: Diagnosis not present

## 2016-07-19 DIAGNOSIS — S2232XA Fracture of one rib, left side, initial encounter for closed fracture: Secondary | ICD-10-CM | POA: Diagnosis not present

## 2016-07-19 DIAGNOSIS — W19XXXA Unspecified fall, initial encounter: Secondary | ICD-10-CM | POA: Diagnosis not present

## 2016-07-30 ENCOUNTER — Ambulatory Visit (INDEPENDENT_AMBULATORY_CARE_PROVIDER_SITE_OTHER): Payer: BLUE CROSS/BLUE SHIELD

## 2016-07-30 DIAGNOSIS — J4540 Moderate persistent asthma, uncomplicated: Secondary | ICD-10-CM

## 2016-07-30 MED ORDER — OMALIZUMAB 150 MG ~~LOC~~ SOLR
375.0000 mg | Freq: Once | SUBCUTANEOUS | Status: AC
  Administered 2016-07-30: 375 mg via SUBCUTANEOUS

## 2016-08-02 ENCOUNTER — Telehealth: Payer: Self-pay | Admitting: Internal Medicine

## 2016-08-02 NOTE — Telephone Encounter (Addendum)
#  vials:6 Ordered date:08/02/16 I returned [facility] call had to reorder 08/07/16 Shipping Date:08/07/16 Med. Couldn't be delivered until tomorrow. (08/08/16)

## 2016-08-06 ENCOUNTER — Ambulatory Visit: Payer: BLUE CROSS/BLUE SHIELD | Admitting: Internal Medicine

## 2016-08-06 ENCOUNTER — Encounter (INDEPENDENT_AMBULATORY_CARE_PROVIDER_SITE_OTHER): Payer: BLUE CROSS/BLUE SHIELD | Admitting: Internal Medicine

## 2016-08-06 DIAGNOSIS — B4481 Allergic bronchopulmonary aspergillosis: Secondary | ICD-10-CM

## 2016-08-06 DIAGNOSIS — J4550 Severe persistent asthma, uncomplicated: Secondary | ICD-10-CM

## 2016-08-06 LAB — PULMONARY FUNCTION TEST
DL/VA % pred: 108 %
DL/VA: 5 ml/min/mmHg/L
DLCO COR % PRED: 59 %
DLCO UNC % PRED: 59 %
DLCO UNC: 18.56 ml/min/mmHg
DLCO cor: 18.35 ml/min/mmHg
FEF 25-75 POST: 0.82 L/s
FEF 25-75 PRE: 0.69 L/s
FEF2575-%Change-Post: 20 %
FEF2575-%Pred-Post: 22 %
FEF2575-%Pred-Pre: 18 %
FEV1-%CHANGE-POST: 8 %
FEV1-%PRED-POST: 43 %
FEV1-%Pred-Pre: 39 %
FEV1-POST: 1.63 L
FEV1-PRE: 1.5 L
FEV1FVC-%CHANGE-POST: 5 %
FEV1FVC-%Pred-Pre: 65 %
FEV6-%CHANGE-POST: 3 %
FEV6-POST: 2.89 L
FEV6-PRE: 2.78 L
FEV6FVC-%CHANGE-POST: 1 %
FVC-%CHANGE-POST: 2 %
FVC-%PRED-PRE: 61 %
FVC-%Pred-Post: 63 %
FVC-POST: 2.93 L
FVC-PRE: 2.84 L
Post FEV1/FVC ratio: 56 %
Post FEV6/FVC ratio: 99 %
Pre FEV1/FVC ratio: 53 %
Pre FEV6/FVC Ratio: 98 %
RV % PRED: 129 %
RV: 2.35 L
TLC % pred: 76 %
TLC: 5.14 L

## 2016-08-07 NOTE — Telephone Encounter (Signed)
Bernique called and states that refill for Xolair was too early and delivery needs to be rescheduled until tomorrow. Call back to reschedule delivery at [phone]

## 2016-08-08 DIAGNOSIS — B4481 Allergic bronchopulmonary aspergillosis: Secondary | ICD-10-CM | POA: Diagnosis not present

## 2016-08-08 DIAGNOSIS — B449 Aspergillosis, unspecified: Secondary | ICD-10-CM | POA: Diagnosis not present

## 2016-08-08 DIAGNOSIS — J4540 Moderate persistent asthma, uncomplicated: Secondary | ICD-10-CM | POA: Diagnosis not present

## 2016-08-08 DIAGNOSIS — A310 Pulmonary mycobacterial infection: Secondary | ICD-10-CM | POA: Diagnosis not present

## 2016-08-09 NOTE — Telephone Encounter (Signed)
#  Vials:6 Arrival Date:08/09/16 Lot #:[id number] Exp Date:3/21

## 2016-08-13 ENCOUNTER — Ambulatory Visit (INDEPENDENT_AMBULATORY_CARE_PROVIDER_SITE_OTHER): Payer: BLUE CROSS/BLUE SHIELD

## 2016-08-13 DIAGNOSIS — J4540 Moderate persistent asthma, uncomplicated: Secondary | ICD-10-CM

## 2016-08-13 MED ORDER — OMALIZUMAB 150 MG ~~LOC~~ SOLR
375.0000 mg | SUBCUTANEOUS | Status: DC
  Administered 2016-08-13: 375 mg via SUBCUTANEOUS

## 2016-08-27 ENCOUNTER — Ambulatory Visit (INDEPENDENT_AMBULATORY_CARE_PROVIDER_SITE_OTHER): Payer: BLUE CROSS/BLUE SHIELD

## 2016-08-27 ENCOUNTER — Encounter: Payer: Self-pay | Admitting: Internal Medicine

## 2016-08-27 ENCOUNTER — Ambulatory Visit (INDEPENDENT_AMBULATORY_CARE_PROVIDER_SITE_OTHER): Payer: BLUE CROSS/BLUE SHIELD | Admitting: Internal Medicine

## 2016-08-27 VITALS — BP 112/72 | HR 80 | Ht 69.0 in | Wt 122.4 lb

## 2016-08-27 DIAGNOSIS — A310 Pulmonary mycobacterial infection: Secondary | ICD-10-CM

## 2016-08-27 DIAGNOSIS — B4481 Allergic bronchopulmonary aspergillosis: Secondary | ICD-10-CM | POA: Diagnosis not present

## 2016-08-27 DIAGNOSIS — B449 Aspergillosis, unspecified: Secondary | ICD-10-CM | POA: Diagnosis not present

## 2016-08-27 DIAGNOSIS — R042 Hemoptysis: Secondary | ICD-10-CM | POA: Diagnosis not present

## 2016-08-27 DIAGNOSIS — J4540 Moderate persistent asthma, uncomplicated: Secondary | ICD-10-CM

## 2016-08-27 DIAGNOSIS — Z23 Encounter for immunization: Secondary | ICD-10-CM | POA: Diagnosis not present

## 2016-08-27 DIAGNOSIS — J4550 Severe persistent asthma, uncomplicated: Secondary | ICD-10-CM | POA: Diagnosis not present

## 2016-08-27 MED ORDER — OMALIZUMAB 150 MG ~~LOC~~ SOLR
375.0000 mg | Freq: Once | SUBCUTANEOUS | Status: AC
  Administered 2016-08-27: 375 mg via SUBCUTANEOUS

## 2016-08-27 MED ORDER — FLUTICASONE FUROATE-VILANTEROL 100-25 MCG/INH IN AEPB: 1.0000 | INHALATION_SPRAY | Freq: Every day | RESPIRATORY_TRACT | 0 refills | Status: DC

## 2016-08-27 NOTE — Patient Instructions (Addendum)
ICD-9-CM ICD-10-CM   1. Severe persistent asthma, uncomplicated 909.31 P21.62   2. ABPA (allergic bronchopulmonary aspergillosis) (HCC) 518.6 B44.81   3. Hemoptysis 786.30 R04.2   4. 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HCC) 117.3 B44.9   5. Mycobacterium avium infection ([place]) 031.0 A31.0     Stable disesase including lung function since 2015/2014 Carlyon Prows helping you  PLAN Monitor your hemoptysis - coughing blood - if worse go to ER FLu shot 08/27/2016 - continue voriconazole, Brio, Spiriva Xolair  and prednisone as before and also recently at all  Followup  - 6 months or sooner if needed

## 2016-08-27 NOTE — Progress Notes (Signed)
Subjective:     Patient ID: [name], male   DOB: 1976/04/28, 40 y.o.   MRN: [id number]  PCP No PCP Per Patient   HPI   OV 01/16/2016  Chief Complaint  Patient presents with  . Follow-up    Pt denies any increased dyspnea at this time - states that it comes and goes with activity. Pt states it is different on a daily basis. Denies hemoptysis since last OV.   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 Since then he overall feels that his dyspnea is not back to baseline. He feels he has had a new lower baseline. He has more fatigued than usual. But he is stable without any exacerbation. He feels in the next few years ago started to become permanently disabled. He currently works as a Biomedical scientist. There are no fevers.   Last chest x-ray 11/07/2015: Shows hyperinflation personally visualized Last IgE January 2013 was 841 and significantly improved from 4000 2012.  Last CBC January 2017 with normal used to flows are 100 cells per cubic millimeter.   OV 08/27/2016  Chief Complaint  Patient presents with  . Follow-up    Pt states his cough and SOB is at baseline. Pt states he has hemoptysis around once a week at most - penny size of mucus. Pt denies CP/tightness and f/c/s.      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 He status post IR guided embolization of hemoptysis end 2016.   Last visit feb 2017 he was reporting worsening dyspnea. We checked his IgE. Improved significantly but it was still in the 700s. We started Xolair therapy. He's been doing this now for a few months. He says this has helped his dyspnea symptoms significantly. However at end of 2 weeks he starts getting more symptomatic. Overall he is stable. 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 This no worsening dyspnea on exertion compared to baseline. There are no fevers. Here, function test  today FEV1 is 1.65 L/42% postbronchodilator. This is similar to 2014 but improved compared to 2015. Off note he tells me that he is mild hemoptysis once a month this is stable. He says this is actually been going on since November 2016 when he had his embolization. This no change in this. He knows that he has to monitor his hemoptysis volume. He will have his flu shot today  Last chest x-ray 11/07/2015: Shows hyperinflation personally visualized Last IgE FEb 2017 was 717 and started xolair (January 2013 was 841 and significantly improved from 4000 in  2012)    has a past medical history of 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[place]); Esophageal reflux; Hypothyroidism; Lung disease, bullous ([place]); MAI (mycobacterium avium-intracellulare) ([place]); Mold exposure (12/05/2015); and Solar lentigo (08/08/2015).   reports that he quit smoking about 8 years ago. His smoking use included Cigarettes. He has a 13.30 pack-year smoking history. He quit smokeless tobacco use about 21 years ago. His smokeless tobacco use included Snuff.  Past Surgical History:  Procedure Laterality Date  . left VATS  2012   thoracotomy and LUL apical posterior segmentectomy  . VIDEO BRONCHOSCOPY Bilateral 10/20/2015   Procedure: VIDEO BRONCHOSCOPY WITHOUT FLUORO;  Surgeon: Marshell Garfinkel, MD;  Location: [facility];  Service: Cardiopulmonary;  Laterality: Bilateral;    Allergies  Allergen Reactions  . Clarithromycin [Clarithromycin]     Adverse reaction w/ voriconazole  . Rifaximin [Rifaximin]  Adverse reaction w/ voriconazole    Immunization History  Administered Date(s) Administered  . Influenza Split 10/29/2011, 08/14/2012  . Influenza Whole 09/19/2010  . Influenza,inj,Quad PF,36+ Mos 11/23/2013, 09/28/2014, 09/06/2015  . Pneumococcal Polysaccharide-23 12/17/2010    Family History  Problem Relation Age of Onset  . Adopted: Yes     Current Outpatient Prescriptions:  .  albuterol (PROVENTIL HFA;VENTOLIN HFA) 108 (90 Base) MCG/ACT  inhaler, Inhale 2 puffs into the lungs every 6 (six) hours as needed., Disp: 1 Inhaler, Rfl: 4 .  alendronate (FOSAMAX) 70 MG tablet, Take 1 tablet (70 mg total) by mouth once a week. Take with a full glass of water on an empty stomach., Disp: 4 tablet, Rfl: 11 .  dextromethorphan-guaiFENesin (TUSSIN DM) 10-100 MG/5ML liquid, Take 10 mLs by mouth every 4 (four) hours as needed for cough., Disp: 118 mL, Rfl: 0 .  EPINEPHrine 0.3 mg/0.3 mL IJ SOAJ injection, Inject 0.3 mLs (0.3 mg total) into the muscle once., Disp: 1 Device, Rfl: 11 .  ethambutol (MYAMBUTOL) 400 MG tablet, TAKE TWO AND ONE-HALF TABLETS BY MOUTH ONCE DAILY, Disp: 225 tablet, Rfl: 5 .  ferrous sulfate (FERRO-BOB) 325 (65 FE) MG tablet, Take 325 mg by mouth daily.  , Disp: , Rfl:  .  Fluticasone Furoate-Vilanterol (BREO ELLIPTA) 100-25 MCG/INH AEPB, Take 1 puff by mouth daily., Disp: 60 each, Rfl: 5 .  levothyroxine (SYNTHROID, LEVOTHROID) 50 MCG tablet, Take 1 tablet by mouth daily., Disp: , Rfl: 0 .  predniSONE (DELTASONE) 20 MG tablet, Take 0.5 tablets (10 mg total) by mouth daily. (Patient taking differently: Take 5 mg by mouth daily. ), Disp: 60 tablet, Rfl: 5 .  tiotropium (SPIRIVA HANDIHALER) 18 MCG inhalation capsule, Place 1 capsule (18 mcg total) into inhaler and inhale daily., Disp: 30 capsule, Rfl: 1 .  voriconazole (VFEND) 200 MG tablet, Take 1 tablet (200 mg total) by mouth 2 (two) times daily., Disp: 60 tablet, Rfl: 11  Current Facility-Administered Medications:  .  omalizumab (XOLAIR) injection 375 mg, 375 mg, Subcutaneous, Q14 Days, Brand Males, MD, 375 mg at 06/18/16 1157 .  omalizumab (XOLAIR) injection 375 mg, 375 mg, Subcutaneous, Q14 Days, Brand Males, MD, 375 mg at 06/04/16 1448 .  omalizumab (XOLAIR) injection 375 mg, 375 mg, Subcutaneous, Q14 Days, Brand Males, MD, 375 mg at 07/02/16 1156 .  omalizumab Arvid Right) injection 375 mg, 375 mg, Subcutaneous, Q14 Days, Brand Males, MD, 375 mg at  08/13/16 1221    Review of Systems     Objective:   Physical Exam  Constitutional: He is oriented to person, place, and time. No distress.  HENT:  Head: Normocephalic and atraumatic.  Right Ear: External ear normal.  Left Ear: External ear normal.  Mouth/Throat: Oropharynx is clear and moist. No oropharyngeal exudate.  Eyes: Conjunctivae and EOM are normal. Pupils are equal, round, and reactive to light. Right eye exhibits no discharge. Left eye exhibits no discharge. No scleral icterus.  Neck: Normal range of motion. Neck supple. No JVD present. No tracheal deviation present. No thyromegaly present.  Cardiovascular: Normal rate, regular rhythm and intact distal pulses.  Exam reveals no gallop and no friction rub.   No murmur heard. Pulmonary/Chest: Effort normal and breath sounds normal. No respiratory distress. He has no wheezes. He has no rales. He exhibits no tenderness.  barrell chest - mild Bilateral L > R UL crackles +_  Abdominal: Soft. Bowel sounds are normal. He exhibits no distension and no mass. There is no tenderness. There is  no rebound and no guarding.  Musculoskeletal: Normal range of motion. He exhibits no edema or tenderness.  Lymphadenopathy:    He has no cervical adenopathy.  Neurological: He is alert and oriented to person, place, and time. He has normal reflexes. No cranial nerve deficit. Coordination normal.  Skin: Skin is warm and dry. No rash noted. He is not diaphoretic. No erythema. No pallor.  Psychiatric: He has a normal mood and affect. His behavior is normal. Judgment and thought content normal.  Nursing note and vitals reviewed.   Vitals:   08/27/16 0916  BP: 112/72  Pulse: 80  SpO2: 99%  Weight: 122 lb 6.4 oz (55.5 kg)  Height: _0  (1.753 m)   Estimated body mass index is 18.08 kg/m as calculated from the following:   Height as of this encounter: _1  (1.753 m).   Weight as of this encounter: 122 lb 6.4 oz (55.5 kg).      Assessment:        ICD-9-CM ICD-10-CM   1. Severe persistent asthma, uncomplicated 444.61 J01.22   2. ABPA (allergic bronchopulmonary aspergillosis) (HCC) 518.6 B44.81   3. Hemoptysis 786.30 R04.2   4. 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HCC) 117.3 B44.9   5. Mycobacterium avium infection ([place]) 031.0 A31.0        Plan:      Stable disesase including lung function since 2015/2014 Glad xolair helping you  PLAN Monitor your hemoptysis - coughing blood - if worse go to ER FLu shot 08/27/2016 - continue voriconazole, Brio, Spiriva Xolair  and prednisone as before and also recently at all  Followup  - 6 months or sooner if needed  - kleep up ID appt with DR Tommy Medal as below     Future Appointments Date Time Provider [place]  09/10/2016 8:45 AM [place] None  09/24/2016 8:45 AM [facility] None  10/08/2016 8:45 AM [facility] None  10/15/2016 8:45 AM Truman Hayward, MD [facility]  10/22/2016 8:45 AM [facility] None  11/05/2016 8:45 AM [facility] None     Dr. Brand Males, M.D., [facility].C.P Pulmonary and Critical Care Medicine Staff Physician [facility] Pager: [phone], If no answer or between  15:00h - 7:00h: call [phone]  08/27/2016 9:36 AM

## 2016-08-27 NOTE — Addendum Note (Signed)
Addended by: Benson Setting L on: 08/27/2016 10:00 AM   Modules accepted: Orders

## 2016-08-28 ENCOUNTER — Telehealth: Payer: Self-pay | Admitting: Internal Medicine

## 2016-08-28 DIAGNOSIS — E890 Postprocedural hypothyroidism: Secondary | ICD-10-CM | POA: Diagnosis not present

## 2016-08-28 DIAGNOSIS — B49 Unspecified mycosis: Secondary | ICD-10-CM | POA: Diagnosis not present

## 2016-08-28 DIAGNOSIS — J4540 Moderate persistent asthma, uncomplicated: Secondary | ICD-10-CM | POA: Diagnosis not present

## 2016-08-28 DIAGNOSIS — M79605 Pain in left leg: Secondary | ICD-10-CM | POA: Diagnosis not present

## 2016-08-28 DIAGNOSIS — B449 Aspergillosis, unspecified: Secondary | ICD-10-CM | POA: Diagnosis not present

## 2016-08-28 DIAGNOSIS — K219 Gastro-esophageal reflux disease without esophagitis: Secondary | ICD-10-CM | POA: Diagnosis not present

## 2016-08-28 DIAGNOSIS — J984 Other disorders of lung: Secondary | ICD-10-CM | POA: Diagnosis not present

## 2016-08-28 DIAGNOSIS — B4481 Allergic bronchopulmonary aspergillosis: Secondary | ICD-10-CM | POA: Diagnosis not present

## 2016-08-28 DIAGNOSIS — D509 Iron deficiency anemia, unspecified: Secondary | ICD-10-CM | POA: Diagnosis not present

## 2016-08-28 DIAGNOSIS — E039 Hypothyroidism, unspecified: Secondary | ICD-10-CM | POA: Diagnosis not present

## 2016-08-28 DIAGNOSIS — A310 Pulmonary mycobacterial infection: Secondary | ICD-10-CM | POA: Diagnosis not present

## 2016-08-28 NOTE — Telephone Encounter (Signed)
#  vials:6 Ordered date:08/28/16 Shipping Date:09/03/16

## 2016-09-04 NOTE — Telephone Encounter (Signed)
#  Vials:6 Arrival Date:09/04/16 Lot #:[id number] Exp Date:4/21

## 2016-09-10 ENCOUNTER — Ambulatory Visit (INDEPENDENT_AMBULATORY_CARE_PROVIDER_SITE_OTHER): Payer: BLUE CROSS/BLUE SHIELD

## 2016-09-10 DIAGNOSIS — J4540 Moderate persistent asthma, uncomplicated: Secondary | ICD-10-CM | POA: Diagnosis not present

## 2016-09-18 DIAGNOSIS — K219 Gastro-esophageal reflux disease without esophagitis: Secondary | ICD-10-CM | POA: Diagnosis not present

## 2016-09-18 DIAGNOSIS — J984 Other disorders of lung: Secondary | ICD-10-CM | POA: Diagnosis not present

## 2016-09-18 DIAGNOSIS — E890 Postprocedural hypothyroidism: Secondary | ICD-10-CM | POA: Diagnosis not present

## 2016-09-18 DIAGNOSIS — E039 Hypothyroidism, unspecified: Secondary | ICD-10-CM | POA: Diagnosis not present

## 2016-09-18 DIAGNOSIS — B49 Unspecified mycosis: Secondary | ICD-10-CM | POA: Diagnosis not present

## 2016-09-18 DIAGNOSIS — D509 Iron deficiency anemia, unspecified: Secondary | ICD-10-CM | POA: Diagnosis not present

## 2016-09-18 DIAGNOSIS — M79605 Pain in left leg: Secondary | ICD-10-CM | POA: Diagnosis not present

## 2016-09-18 MED ORDER — OMALIZUMAB 150 MG ~~LOC~~ SOLR: 150.0000 mg | Freq: Once | SUBCUTANEOUS | Status: DC

## 2016-09-18 MED ORDER — OMALIZUMAB 150 MG ~~LOC~~ SOLR
375.0000 mg | Freq: Once | SUBCUTANEOUS | Status: AC
  Administered 2016-09-10: 375 mg via SUBCUTANEOUS

## 2016-09-24 ENCOUNTER — Ambulatory Visit (INDEPENDENT_AMBULATORY_CARE_PROVIDER_SITE_OTHER): Payer: BLUE CROSS/BLUE SHIELD

## 2016-09-24 DIAGNOSIS — J4550 Severe persistent asthma, uncomplicated: Secondary | ICD-10-CM | POA: Diagnosis not present

## 2016-09-25 ENCOUNTER — Telehealth: Payer: Self-pay | Admitting: Internal Medicine

## 2016-09-25 NOTE — Telephone Encounter (Signed)
#  vials:6 Ordered date:09/25/16 Shipping Date:10/03/16

## 2016-09-26 NOTE — Progress Notes (Signed)
Pt's Xolair supplied by [facility]. No charge in EP[facility] needed for medication.  [name],CMA 

## 2016-09-26 NOTE — Progress Notes (Signed)
Pt's Xolair supplied by [facility]. No charge in EPIC needed for medication.  Blair Hailey

## 2016-09-26 NOTE — Progress Notes (Signed)
Pt's Xolair supplied by [facility]. No charge in EPIC needed for medication.   [name]

## 2016-09-26 NOTE — Progress Notes (Signed)
Patient ID: [name], male   DOB: 1976-11-10, 40 y.o.   MRN: [id number]   Pt's Xolair supplied by [facility]. No charge in [facility] for Xolair.  Blair Hailey

## 2016-09-26 NOTE — Progress Notes (Signed)
Pt's Xolair supplied by [facility]. No charge in EPIC needed for medication.  Katie Welchel,CMA 

## 2016-10-01 DIAGNOSIS — B449 Aspergillosis, unspecified: Secondary | ICD-10-CM | POA: Diagnosis not present

## 2016-10-01 DIAGNOSIS — J4540 Moderate persistent asthma, uncomplicated: Secondary | ICD-10-CM | POA: Diagnosis not present

## 2016-10-01 DIAGNOSIS — B4481 Allergic bronchopulmonary aspergillosis: Secondary | ICD-10-CM | POA: Diagnosis not present

## 2016-10-01 DIAGNOSIS — A310 Pulmonary mycobacterial infection: Secondary | ICD-10-CM | POA: Diagnosis not present

## 2016-10-04 NOTE — Telephone Encounter (Signed)
#  Vials:6 Arrival Date:10/04/16 Lot #:[id number] Exp Date:5/21

## 2016-10-05 MED ORDER — OMALIZUMAB 150 MG ~~LOC~~ SOLR
375.0000 mg | SUBCUTANEOUS | Status: DC
  Administered 2016-09-24: 375 mg via SUBCUTANEOUS

## 2016-10-08 ENCOUNTER — Ambulatory Visit: Payer: BLUE CROSS/BLUE SHIELD

## 2016-10-10 ENCOUNTER — Ambulatory Visit: Payer: BLUE CROSS/BLUE SHIELD

## 2016-10-15 ENCOUNTER — Ambulatory Visit (INDEPENDENT_AMBULATORY_CARE_PROVIDER_SITE_OTHER): Payer: BLUE CROSS/BLUE SHIELD | Admitting: Infectious Disease

## 2016-10-15 ENCOUNTER — Encounter: Payer: Self-pay | Admitting: Infectious Disease

## 2016-10-15 VITALS — BP 111/76 | HR 58 | Temp 97.9°F | Ht 69.0 in | Wt 122.0 lb

## 2016-10-15 DIAGNOSIS — B440 Invasive pulmonary aspergillosis: Secondary | ICD-10-CM

## 2016-10-15 DIAGNOSIS — R634 Abnormal weight loss: Secondary | ICD-10-CM | POA: Diagnosis not present

## 2016-10-15 DIAGNOSIS — B449 Aspergillosis, unspecified: Secondary | ICD-10-CM | POA: Diagnosis not present

## 2016-10-15 DIAGNOSIS — A310 Pulmonary mycobacterial infection: Secondary | ICD-10-CM

## 2016-10-15 HISTORY — DX: Abnormal weight loss: R63.4

## 2016-10-15 LAB — COMPLETE METABOLIC PANEL WITH GFR
ALT: 8 U/L — ABNORMAL LOW (ref 9–46)
AST: 14 U/L (ref 10–40)
Albumin: 3.8 g/dL (ref 3.6–5.1)
Alkaline Phosphatase: 74 U/L (ref 40–115)
BILIRUBIN TOTAL: 0.4 mg/dL (ref 0.2–1.2)
BUN: 9 mg/dL (ref 7–25)
CO2: 33 mmol/L — AB (ref 20–31)
Calcium: 9.1 mg/dL (ref 8.6–10.3)
Chloride: 103 mmol/L (ref 98–110)
Creat: 0.79 mg/dL (ref 0.60–1.35)
GFR, Est African American: >89 mL/min (ref >=60)
GLUCOSE: 72 mg/dL (ref 65–99)
Potassium: 3.7 mmol/L (ref 3.5–5.3)
SODIUM: 141 mmol/L (ref 135–146)
TOTAL PROTEIN: 6.8 g/dL (ref 6.1–8.1)

## 2016-10-15 NOTE — Progress Notes (Signed)
Mycobacterium avium infection and aspergillosis  Subjective:  Chief complaint:  : Patient ID: [name], male    DOB: Jul 31, 1976, 40 y.o.   MRN: [id number]  HPI  40 year-old Asian with history of Mycobacterium avium infection also with history history of aspergilloma status post resection by cardiothoracic surgery then found to have what appears to be allergic bronchopulmonary aspergillosis Spring 2012 and restarted on voriconazole and steroids, then found to have apparent new aspergilloma. We had taken him off his Mycobacterium avium drugs and then he developed fevers chills and malaise  Spring (2013) and was seen by my partner Dr. Megan Salon in late April  His AFB smears and cultures obtained in the clinic on that day prior to the initiation of azithromycin ethambutol ultimately failed to grow Mycobacterium avium. He had been doing well I saw him in [place] 2013 we discontinued his azithromycin and ethambutol again. Unfortunately he again shortly thereafter developed fevers cough malaise. He started back on azithromycin ethambutol. Cultures done for AFB here were again negative. The patient however felt dramatically better having been back on azithromycin and ethambutol.   I last saw him in January of 2015 when he was feeling relatively well.  He did have an episode of coughing up a few flecks of blood in January 2016 which prompted appt with [facility] but by time he made appt this had long since resolved. Was only a one time episode none since then.  I saw again in March and then in September 2016. In summer he was hospitalized with hemoptysis and found to have an area in the upper lobe consistent with an aspergilloma. He underwent bronchoscopy with BAL and cultures ultimately yielded a Saccharomyces species on fungal culture. His serum galactomannan was negative.  He has been  on voriconazole and axial has had radiographic improvement and his x-ray findings. He also feelt etter symptomatically  with no more hemoptysis improvement in his cough improvement in his energy though he still feels not nearly as well as he did prior to his hospitalization. He is avoiding exercised during the winter as it often precipitates coughing spells. He was  only taking 200 mg twice a day of voriconazole rather than the 300 mg he has continued this along with his ethambutol and azithromycin. He continues to have cough more so with exercise and has not been exercising at "[employer]" due to the fact this elicits more coughing. He has not smoked since 2007. He is seeing [name] and is being considered for new drug infusion to treat his COPD.  Since I a lot saw him he has been doing relatively well. However he has noted a 20 pound weight loss since last year he somewhat attributes this to not lifting weights as much but it worries me a bit since it is not a problem with him reducing his food intake. He is also not had imaging of the chest with CT in a while.  Past Medical History:  Diagnosis Date  . 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[place])   . Esophageal reflux   . 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 Lung disease, bullous ([place])   . MAI (mycobacterium avium-intracellulare) ([place])   . Mold exposure 12/05/2015  . Solar lentigo 08/08/2015  . Weight loss 10/15/2016   Past Surgical History:  Procedure Laterality Date  . left VATS  2012   thoracotomy and LUL apical posterior segmentectomy  . VIDEO BRONCHOSCOPY Bilateral 10/20/2015   Procedure: VIDEO BRONCHOSCOPY WITHOUT FLUORO;  Surgeon: Marshell Garfinkel, MD;  Location: [facility];  Service: Cardiopulmonary;  Laterality: Bilateral;   Family History  Problem Relation Age of Onset  . Adopted: Yes   Social History  Substance Use Topics  . Smoking status: Former Smoker    Packs/day: 0.70    Years: 19.00    Types: Cigarettes    Quit date: 11/20/2007  . Smokeless tobacco: Former Systems developer    Types: Snuff    Quit date: 11/19/1994  . Alcohol use No     Allergies  Allergen Reactions  . Clarithromycin [Clarithromycin]     Adverse reaction w/ voriconazole  . Rifaximin [Rifaximin]     Adverse reaction w/ voriconazole    Current Outpatient Prescriptions:  .  albuterol (PROVENTIL HFA;VENTOLIN HFA) 108 (90 Base) MCG/ACT inhaler, Inhale 2 puffs into the lungs every 6 (six) hours as needed., Disp: 1 Inhaler, Rfl: 4 .  alendronate (FOSAMAX) 70 MG tablet, Take 1 tablet (70 mg total) by mouth once a week. Take with a full glass of water on an empty stomach., Disp: 4 tablet, Rfl: 11 .  dextromethorphan-guaiFENesin (TUSSIN DM) 10-100 MG/5ML liquid, Take 10 mLs by mouth every 4 (four) hours as needed for cough., Disp: 118 mL, Rfl: 0 .  EPINEPHrine 0.3 mg/0.3 mL IJ SOAJ injection, Inject 0.3 mLs (0.3 mg total) into the muscle once., Disp: 1 Device, Rfl: 11 .  ethambutol (MYAMBUTOL) 400 MG tablet, TAKE TWO AND ONE-HALF TABLETS BY MOUTH ONCE DAILY, Disp: 225 tablet, Rfl: 5 .  ferrous sulfate (FERRO-BOB) 325 (65 FE) MG tablet, Take 325 mg by mouth daily.  , Disp: , Rfl:  .  fluticasone furoate-vilanterol (BREO ELLIPTA) 100-25 MCG/INH AEPB, Inhale 1 puff into the lungs daily., Disp: 120 each, Rfl: 0 .  levothyroxine (SYNTHROID, LEVOTHROID) 50 MCG tablet, Take 1 tablet by mouth daily., Disp: , Rfl: 0 .  predniSONE (DELTASONE) 20 MG tablet, Take 0.5 tablets (10 mg total) by mouth daily. (Patient taking differently: Take 5 mg by mouth daily. ), Disp: 60 tablet, Rfl: 5 .  tiotropium (SPIRIVA HANDIHALER) 18 MCG inhalation capsule, Place 1 capsule (18 mcg total) into inhaler and inhale daily., Disp: 30 capsule, Rfl: 1 .  voriconazole (VFEND) 200 MG tablet, Take 1 tablet (200 mg total) by mouth 2 (two) times daily., Disp: 60 tablet, Rfl: 11  Current Facility-Administered Medications:  .  omalizumab (XOLAIR) injection 375 mg, 375 mg, Subcutaneous, Q14 Days, Brand Males, MD, 375 mg at 09/24/16 1640    Review of Systems  Constitutional: Negative for  activity change, appetite change, chills, diaphoresis, fatigue, fever and unexpected weight change.  HENT: Negative for congestion, rhinorrhea, sinus pressure, sneezing, sore throat and trouble swallowing.   Eyes: Negative for photophobia and visual disturbance.  Respiratory: Positive for cough and shortness of breath. Negative for chest tightness, wheezing and stridor.   Cardiovascular: Negative for chest pain, palpitations and leg swelling.  Gastrointestinal: Negative for abdominal distention, abdominal pain, anal bleeding, blood in stool, constipation, diarrhea, nausea and vomiting.  Genitourinary: Negative for difficulty urinating, dysuria, flank pain and hematuria.  Musculoskeletal: Negative for arthralgias, back pain, gait problem, joint swelling and myalgias.  Skin: Positive for rash. Negative for color change, pallor and wound.  Neurological: Negative for dizziness, tremors, weakness and light-headedness.  Hematological: Negative for adenopathy. Does not bruise/bleed easily.  Psychiatric/Behavioral: Negative for agitation, behavioral problems, confusion, decreased concentration, dysphoric mood and sleep disturbance.       Objective:   Physical Exam  Constitutional: He is oriented to  person, place, and time. He appears well-nourished. No distress.  HENT:  Head: Normocephalic and atraumatic.  Eyes: Conjunctivae and EOM are normal. Pupils are equal, round, and reactive to light. No scleral icterus.  Neck: Normal range of motion. Neck supple.  Cardiovascular: Normal rate, regular rhythm and normal heart sounds.  Exam reveals no gallop and no friction rub.   No murmur heard. Pulmonary/Chest: No respiratory distress. He has decreased breath sounds in the right lower field.  Prolonged expiratory phase  Abdominal: He exhibits no distension and no mass. There is no tenderness. There is no rebound and no guarding.  Musculoskeletal: He exhibits no edema or tenderness.  Lymphadenopathy:     He has no cervical adenopathy.  Neurological: He is alert and oriented to person, place, and time. He exhibits normal muscle tone. Coordination normal.  Skin: Skin is warm and dry. He is not diaphoretic. No erythema. No pallor.     Psychiatric: He has a normal mood and affect. His behavior is normal. Judgment and thought content normal.          Assessment & Plan:   Aspergilloma: continue lifelong voriconazole and check VFEND level with CBC and  CMP today. Older CT scan today with contrast see below   Allergic bronchopulmonary aspgergillosis: As above, lifelong voriconazole steroids per Pulmonary medicine. We will get a voriconazole level today but it is at least an hour or hour and a half on the time he would take it so will be artificially low. Will need to interpret with caution him after comeback later and restructure his dosing so that we can check a true trough level.  Mycobacterium avium pulmonary infection: We have been reluctant to stop these medications due to prior flares when he comes off the medication indications. He is not interested in stopping them at all  Involuntary weight loss: With his prior history of smoking I will get a CT of his chest further workup can be deferred to primary care physician.  I spent greater than 25  minutes with the patient including greater than 50% of time in face to face counsel of the patient regarding his aspergilloma allergic bronchopulmonary aspergillosis and Mycobacterium avium infections , weight loss and in coordination of his care.

## 2016-10-17 ENCOUNTER — Other Ambulatory Visit: Payer: Self-pay | Admitting: Infectious Disease

## 2016-10-17 ENCOUNTER — Telehealth: Payer: Self-pay | Admitting: *Deleted

## 2016-10-17 DIAGNOSIS — B440 Invasive pulmonary aspergillosis: Secondary | ICD-10-CM

## 2016-10-17 NOTE — Telephone Encounter (Signed)
Called patient and notified him of appt for CT scan on 10/23/16 at 4:00 pm at [place], [facility]. No solid food after 12:00 pm on that day. This test has been authorized through [employer] and the order # is 10/23/16 at 4:00 pm.  Myrtis Hopping  CMA

## 2016-10-18 LAB — VORICONAZOLE QUANT BY LC/MS: VORICONAZOLE QUANT BY LC/MS: 0.9 ug/mL

## 2016-10-19 ENCOUNTER — Other Ambulatory Visit: Payer: Self-pay | Admitting: Internal Medicine

## 2016-10-22 ENCOUNTER — Ambulatory Visit (INDEPENDENT_AMBULATORY_CARE_PROVIDER_SITE_OTHER): Payer: BLUE CROSS/BLUE SHIELD

## 2016-10-22 DIAGNOSIS — J4520 Mild intermittent asthma, uncomplicated: Secondary | ICD-10-CM | POA: Diagnosis not present

## 2016-10-22 MED ORDER — OMALIZUMAB 150 MG ~~LOC~~ SOLR
375.0000 mg | SUBCUTANEOUS | Status: DC
  Administered 2016-10-22: 375 mg via SUBCUTANEOUS

## 2016-10-23 ENCOUNTER — Ambulatory Visit
Admission: RE | Admit: 2016-10-23 | Discharge: 2016-10-23 | Disposition: A | Discharge status: Home / Self Care | Payer: BLUE CROSS/BLUE SHIELD | Source: Ambulatory Visit | Attending: Infectious Disease | Admitting: Infectious Disease

## 2016-10-23 DIAGNOSIS — R918 Other nonspecific abnormal finding of lung field: Secondary | ICD-10-CM | POA: Diagnosis not present

## 2016-10-23 DIAGNOSIS — B440 Invasive pulmonary aspergillosis: Secondary | ICD-10-CM

## 2016-10-23 MED ORDER — IOPAMIDOL (ISOVUE-300) INJECTION 61%
75.0000 mL | Freq: Once | INTRAVENOUS | Status: AC | PRN
  Administered 2016-10-23: 75 mL via INTRAVENOUS

## 2016-10-24 NOTE — Progress Notes (Signed)
If that is the case, his level should be much higher now lower due to it being likely a peak not a trough. This would mean that his trough would be much lower than this. We prob need to check a trough level (like before a dose) to see how low it is.

## 2016-11-05 ENCOUNTER — Ambulatory Visit (INDEPENDENT_AMBULATORY_CARE_PROVIDER_SITE_OTHER): Payer: BLUE CROSS/BLUE SHIELD

## 2016-11-05 DIAGNOSIS — J4540 Moderate persistent asthma, uncomplicated: Secondary | ICD-10-CM

## 2016-11-05 MED ORDER — OMALIZUMAB 150 MG ~~LOC~~ SOLR
375.0000 mg | SUBCUTANEOUS | Status: DC
  Administered 2016-11-05: 375 mg via SUBCUTANEOUS

## 2016-11-14 DIAGNOSIS — H5213 Myopia, bilateral: Secondary | ICD-10-CM | POA: Diagnosis not present

## 2016-11-15 ENCOUNTER — Telehealth: Payer: Self-pay | Admitting: *Deleted

## 2016-11-16 DIAGNOSIS — B449 Aspergillosis, unspecified: Secondary | ICD-10-CM | POA: Diagnosis not present

## 2016-11-16 DIAGNOSIS — J4540 Moderate persistent asthma, uncomplicated: Secondary | ICD-10-CM | POA: Diagnosis not present

## 2016-11-16 DIAGNOSIS — A310 Pulmonary mycobacterial infection: Secondary | ICD-10-CM | POA: Diagnosis not present

## 2016-11-16 DIAGNOSIS — B4481 Allergic bronchopulmonary aspergillosis: Secondary | ICD-10-CM | POA: Diagnosis not present

## 2016-11-16 NOTE — Telephone Encounter (Addendum)
#  vials:6 Ordered date:11/15/16 Shipping Date:11/20/16 Forgot to put in epic.

## 2016-11-19 DIAGNOSIS — J4540 Moderate persistent asthma, uncomplicated: Secondary | ICD-10-CM | POA: Diagnosis not present

## 2016-11-20 ENCOUNTER — Ambulatory Visit: Payer: BLUE CROSS/BLUE SHIELD

## 2016-11-21 ENCOUNTER — Ambulatory Visit: Payer: BLUE CROSS/BLUE SHIELD

## 2016-11-21 NOTE — Telephone Encounter (Addendum)
#  Vials:6  Arrival Date:11/21/16 Lot #:[id number] Exp Date:8/21

## 2016-11-23 ENCOUNTER — Ambulatory Visit (INDEPENDENT_AMBULATORY_CARE_PROVIDER_SITE_OTHER): Payer: BLUE CROSS/BLUE SHIELD

## 2016-11-23 DIAGNOSIS — J4540 Moderate persistent asthma, uncomplicated: Secondary | ICD-10-CM

## 2016-11-23 MED ORDER — OMALIZUMAB 150 MG ~~LOC~~ SOLR
375.0000 mg | SUBCUTANEOUS | Status: DC
  Administered 2016-11-23: 375 mg via SUBCUTANEOUS

## 2016-12-03 ENCOUNTER — Ambulatory Visit: Payer: BLUE CROSS/BLUE SHIELD

## 2016-12-17 ENCOUNTER — Ambulatory Visit: Payer: BLUE CROSS/BLUE SHIELD

## 2016-12-25 ENCOUNTER — Other Ambulatory Visit: Payer: Self-pay | Admitting: Internal Medicine

## 2016-12-26 ENCOUNTER — Telehealth: Payer: Self-pay | Admitting: Internal Medicine

## 2016-12-26 MED ORDER — PREDNISONE 10 MG PO TABS: 10.0000 mg | ORAL_TABLET | Freq: Every day | ORAL | 5 refills | Status: DC

## 2016-12-26 NOTE — Telephone Encounter (Signed)
lmtcb x1 for pt.

## 2016-12-26 NOTE — Telephone Encounter (Signed)
Spoke with pt. He needs a refill on prednisone. This has been sent in. Nothing further was needed.

## 2016-12-31 ENCOUNTER — Ambulatory Visit (INDEPENDENT_AMBULATORY_CARE_PROVIDER_SITE_OTHER): Payer: BLUE CROSS/BLUE SHIELD

## 2016-12-31 DIAGNOSIS — J4520 Mild intermittent asthma, uncomplicated: Secondary | ICD-10-CM | POA: Diagnosis not present

## 2017-01-01 ENCOUNTER — Telehealth: Payer: Self-pay | Admitting: Internal Medicine

## 2017-01-01 MED ORDER — OMALIZUMAB 150 MG ~~LOC~~ SOLR
375.0000 mg | SUBCUTANEOUS | Status: DC
  Administered 2016-12-31 – 2017-02-14 (×2): 375 mg via SUBCUTANEOUS

## 2017-01-01 NOTE — Progress Notes (Signed)
Documentation of medication administration of Xolair and charges have been completed by Desmond Dike, CMA based on the [facility] documentation sheet completed by [facility].

## 2017-01-01 NOTE — Telephone Encounter (Signed)
#  vials:6 Ordered date:01/01/17 Shipping Date:01/07/17

## 2017-01-08 DIAGNOSIS — E039 Hypothyroidism, unspecified: Secondary | ICD-10-CM | POA: Diagnosis not present

## 2017-01-08 DIAGNOSIS — E0500 Thyrotoxicosis with diffuse goiter without thyrotoxic crisis or storm: Secondary | ICD-10-CM | POA: Diagnosis not present

## 2017-01-08 NOTE — Telephone Encounter (Signed)
Pt.'s Arvid Right was supposed to have been here today. No such luck, something about his ins.. (It termed, I think rep was talking so fast and using letters and # not many words.) They are however going to be able to research this and still be able to ship med. 01/10/17.  # vials:6 Ordered date:01/08/17 Shipping Date:01/09/17  New order

## 2017-01-14 ENCOUNTER — Telehealth: Payer: Self-pay | Admitting: Internal Medicine

## 2017-01-14 ENCOUNTER — Ambulatory Visit: Payer: BLUE CROSS/BLUE SHIELD | Admitting: *Deleted

## 2017-01-14 NOTE — Telephone Encounter (Signed)
Spoke with pt. He is needing help with his Xolair. Message will be routed to [facility] as she is handling these matters.

## 2017-01-14 NOTE — Telephone Encounter (Signed)
Spoke with pt. We're going to try to get him co-pay assistance. He said he was considered in network  03/2016- 07/2016 then in Oct. 2017 he was considered out of network. Pt. Has [employer]. He currently has a $1,300 bill. I told him we would look into it and go from there.

## 2017-01-15 NOTE — Telephone Encounter (Signed)
Spoke with [name] about Co-pay assistance for Mr. Bakos. She said he would have to call [employer]. Called pt. And lmom, gave him their # and ask him to cb if he had any questions for me.

## 2017-01-15 NOTE — Telephone Encounter (Signed)
Pt. Was aware of ins. [place] and didn't call before he came. Called [facility] while he was here. It was the very thing he was talking to me about while we were waiting on the pharm. Rep.. May thru Sept. Of 2017 pt. Was in network by Oct. Of 2017 he is out of network. I'm going to talk to [facility] and see if we can get him co-pay assistance.

## 2017-01-16 NOTE — Telephone Encounter (Signed)
[name], per our conversation-please have patient contact [employer] to seek the co pay card assistance for funding. Thanks.

## 2017-01-16 NOTE — Telephone Encounter (Signed)
Called pt., left message on with voice mail. I gave him [employer] #, per Joellen Jersey he needs to call [employer] himself and ask for co-pay assistiance. Will wait to hear form pt. And or [employer].

## 2017-01-22 NOTE — Telephone Encounter (Signed)
Waiting for [facility] to resolve pt.'s bill, if not pt. Will seek co-pay assistance from [employer].

## 2017-01-22 NOTE — Telephone Encounter (Signed)
Called pt. He called [facility], he is waiting on a resolution on the $1300. If [facility] can't help him he's going to call [employer]. Hopefully he'll here something soon.

## 2017-01-28 ENCOUNTER — Ambulatory Visit: Payer: BLUE CROSS/BLUE SHIELD

## 2017-01-29 NOTE — Telephone Encounter (Signed)
I called the pt. To make sure he wasn't coming in for his appt. 01/28/17, it wasn't cancelled. I skyped Erica and asked her to cancel it. (Done) Pt. Didn't get a chance to call [facility] Fri.. He said he was going to call them 01/28/17. Hopefully I'll hear something from him today or somtime this wk.[name]

## 2017-01-31 ENCOUNTER — Telehealth: Payer: Self-pay | Admitting: Internal Medicine

## 2017-01-31 DIAGNOSIS — B4481 Allergic bronchopulmonary aspergillosis: Secondary | ICD-10-CM | POA: Diagnosis not present

## 2017-01-31 DIAGNOSIS — J4540 Moderate persistent asthma, uncomplicated: Secondary | ICD-10-CM | POA: Diagnosis not present

## 2017-01-31 DIAGNOSIS — B449 Aspergillosis, unspecified: Secondary | ICD-10-CM | POA: Diagnosis not present

## 2017-01-31 DIAGNOSIS — A310 Pulmonary mycobacterial infection: Secondary | ICD-10-CM | POA: Diagnosis not present

## 2017-01-31 NOTE — Telephone Encounter (Signed)
I forgot this encounter was open and created another one. Apparently [facility] resolve the situation with pt.'s bill. [facility] called to [facility] shipment. Nothing further needed.Closing.

## 2017-01-31 NOTE — Telephone Encounter (Signed)
#  vials:6 Ordered date:01/31/17 Shipping Date:01/31/17

## 2017-01-31 NOTE — Telephone Encounter (Signed)
[facility] Called to set up shipment today for tomorrow. That can only mean the payment issue was resolved. Nothing further needed,closing note.

## 2017-02-01 NOTE — Telephone Encounter (Signed)
#  Vials:6 Arrival Date:02/01/17 Lot #:[id number] Exp Date:9/21

## 2017-02-05 ENCOUNTER — Telehealth: Payer: Self-pay

## 2017-02-05 NOTE — Telephone Encounter (Signed)
Patient calling office since authorization is need for voriconazole . He usually has to do this yearly.  We have not received a request from the insurance company.   I call his speciality company to request documentation needed to process for medications.  They will fax form to our office.    Laverle Patter, RN

## 2017-02-06 ENCOUNTER — Telehealth: Payer: Self-pay | Admitting: Internal Medicine

## 2017-02-06 NOTE — Telephone Encounter (Signed)
I called [employer] to get P/A ref.# [id number] Dates: 03/17/17-03/17/18. I then called [facility] to give the ref. # and dates to them. Pt.'s Current P/A runs out 03/16/17. Nothing further needed.

## 2017-02-11 ENCOUNTER — Ambulatory Visit (INDEPENDENT_AMBULATORY_CARE_PROVIDER_SITE_OTHER): Payer: BLUE CROSS/BLUE SHIELD

## 2017-02-11 DIAGNOSIS — J4550 Severe persistent asthma, uncomplicated: Secondary | ICD-10-CM

## 2017-02-11 DIAGNOSIS — J4520 Mild intermittent asthma, uncomplicated: Secondary | ICD-10-CM | POA: Diagnosis not present

## 2017-02-12 ENCOUNTER — Other Ambulatory Visit: Payer: Self-pay | Admitting: Internal Medicine

## 2017-02-12 ENCOUNTER — Telehealth: Payer: Self-pay | Admitting: Internal Medicine

## 2017-02-12 NOTE — Telephone Encounter (Signed)
Spoke with pt and his rx was sent earlier today, pt had no further questions. Nothing further is needed

## 2017-02-12 NOTE — Telephone Encounter (Signed)
LM x 1 

## 2017-02-12 NOTE — Telephone Encounter (Signed)
Patient is returning phone call.  °

## 2017-02-13 ENCOUNTER — Telehealth: Payer: Self-pay | Admitting: *Deleted

## 2017-02-13 NOTE — Telephone Encounter (Signed)
Received fax from [employer] voriconazole approved from 02/12/17 through 08/10/17. Reference # [id number].  [facility] will [facility] to arrange [facility].  Approval placed upfront for scan. Myrtis Hopping

## 2017-02-14 DIAGNOSIS — J4520 Mild intermittent asthma, uncomplicated: Secondary | ICD-10-CM | POA: Diagnosis not present

## 2017-02-14 MED ORDER — OMALIZUMAB 150 MG ~~LOC~~ SOLR: 375.0000 mg | Freq: Once | SUBCUTANEOUS | Status: DC

## 2017-02-14 NOTE — Progress Notes (Signed)
Mr. Wise came in on 3.26.18 to receive a Xolair injection. The patient is given 326m every 14 days. Due to each vial equalling 159m 15units of medication was wasted.

## 2017-02-25 ENCOUNTER — Ambulatory Visit (INDEPENDENT_AMBULATORY_CARE_PROVIDER_SITE_OTHER): Payer: BLUE CROSS/BLUE SHIELD

## 2017-02-25 DIAGNOSIS — J4540 Moderate persistent asthma, uncomplicated: Secondary | ICD-10-CM | POA: Diagnosis not present

## 2017-02-25 MED ORDER — OMALIZUMAB 150 MG ~~LOC~~ SOLR
375.0000 mg | SUBCUTANEOUS | Status: DC
  Administered 2017-02-25: 375 mg via SUBCUTANEOUS

## 2017-02-25 NOTE — Progress Notes (Signed)
patient came in on 02/25/17  to receive a Xolair injection. The patient is given 328m every 14 days. Due to each vial equalling 1572m 7546mf medication was wasted.

## 2017-02-28 ENCOUNTER — Telehealth: Payer: Self-pay | Admitting: Internal Medicine

## 2017-02-28 NOTE — Telephone Encounter (Signed)
#  vials:6 Ordered date:02/28/17 Shipping Date:03/06/17

## 2017-03-05 ENCOUNTER — Ambulatory Visit: Payer: BLUE CROSS/BLUE SHIELD | Admitting: Internal Medicine

## 2017-03-06 DIAGNOSIS — B449 Aspergillosis, unspecified: Secondary | ICD-10-CM | POA: Diagnosis not present

## 2017-03-06 DIAGNOSIS — J4540 Moderate persistent asthma, uncomplicated: Secondary | ICD-10-CM | POA: Diagnosis not present

## 2017-03-06 DIAGNOSIS — A310 Pulmonary mycobacterial infection: Secondary | ICD-10-CM | POA: Diagnosis not present

## 2017-03-06 DIAGNOSIS — B4481 Allergic bronchopulmonary aspergillosis: Secondary | ICD-10-CM | POA: Diagnosis not present

## 2017-03-07 NOTE — Telephone Encounter (Signed)
#  Vials:6 Arrival Date:03/07/17 Lot #:[id number] Exp Date:9/21

## 2017-03-11 ENCOUNTER — Ambulatory Visit (INDEPENDENT_AMBULATORY_CARE_PROVIDER_SITE_OTHER): Payer: BLUE CROSS/BLUE SHIELD

## 2017-03-11 DIAGNOSIS — J4540 Moderate persistent asthma, uncomplicated: Secondary | ICD-10-CM

## 2017-03-12 MED ORDER — OMALIZUMAB 150 MG ~~LOC~~ SOLR
375.0000 mg | SUBCUTANEOUS | Status: DC
  Administered 2017-03-11: 375 mg via SUBCUTANEOUS

## 2017-03-12 NOTE — Progress Notes (Signed)
Xolair injection documentation and charges entered by Charlise Giovanetti, RMA, based on injection sheet filled out by Tammy Scott per office protocol.   

## 2017-03-18 ENCOUNTER — Ambulatory Visit (INDEPENDENT_AMBULATORY_CARE_PROVIDER_SITE_OTHER): Payer: BLUE CROSS/BLUE SHIELD | Admitting: Internal Medicine

## 2017-03-18 ENCOUNTER — Ambulatory Visit (INDEPENDENT_AMBULATORY_CARE_PROVIDER_SITE_OTHER)
Admission: RE | Admit: 2017-03-18 | Discharge: 2017-03-18 | Disposition: A | Discharge status: Home / Self Care | Payer: BLUE CROSS/BLUE SHIELD | Source: Ambulatory Visit | Attending: Internal Medicine | Admitting: Internal Medicine

## 2017-03-18 ENCOUNTER — Encounter: Payer: Self-pay | Admitting: Internal Medicine

## 2017-03-18 VITALS — BP 118/70 | HR 85 | Ht 69.0 in | Wt 122.2 lb

## 2017-03-18 DIAGNOSIS — B449 Aspergillosis, unspecified: Secondary | ICD-10-CM | POA: Diagnosis not present

## 2017-03-18 DIAGNOSIS — B4481 Allergic bronchopulmonary aspergillosis: Secondary | ICD-10-CM

## 2017-03-18 DIAGNOSIS — R042 Hemoptysis: Secondary | ICD-10-CM

## 2017-03-18 DIAGNOSIS — A310 Pulmonary mycobacterial infection: Secondary | ICD-10-CM | POA: Diagnosis not present

## 2017-03-18 DIAGNOSIS — J4550 Severe persistent asthma, uncomplicated: Secondary | ICD-10-CM | POA: Diagnosis not present

## 2017-03-18 DIAGNOSIS — R05 Cough: Secondary | ICD-10-CM | POA: Diagnosis not present

## 2017-03-18 NOTE — Patient Instructions (Addendum)
ICD-9-CM ICD-10-CM   1. Hemoptysis 786.30 R04.2 DG Chest 2 View  2. Severe persistent asthma without complication 943.27 M14.70   3. 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 Mycobacterium avium infection ([place]) 031.0 A31.0 Ambulatory referral to Pulmonology  5. 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[place]) 117.3 B44.9 Ambulatory referral to Pulmonology     Not sure why you coughed up blood mid April 2018 butu poor sleep can contribute  Plan - If you cough up significant amount of blood please call us immediately - Please do chest x-ray 2 view today; will call with results - Refer lung transplant clinic at [place] in 3 months with June Leap - Return to see me in 3 months or sooner if needed

## 2017-03-18 NOTE — Progress Notes (Signed)
Subjective:     Patient ID: [name], male   DOB: [birth year], 41 y.o.   MRN: [id number]  HPI  PCP No PCP Per Patient   HPI   OV 01/16/2016  Chief Complaint  Patient presents with  . Follow-up    Pt denies any increased dyspnea at this time - states that it comes and goes with activity. Pt states it is different on a daily basis. Denies hemoptysis since last OV.   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 Since then he overall feels that his dyspnea is not back to baseline. He feels he has had a new lower baseline. He has more fatigued than usual. But he is stable without any exacerbation. He feels in the next few years ago started to become permanently disabled. He currently works as a Biomedical scientist. There are no fevers.   Last chest x-ray 11/07/2015: Shows hyperinflation personally visualized Last IgE January 2013 was 841 and significantly improved from 4000 2012.  Last CBC January 2017 with normal used to flows are 100 cells per cubic millimeter.   OV 08/27/2016  Chief Complaint  Patient presents with  . Follow-up    Pt states his cough and SOB is at baseline. Pt states he has hemoptysis around once a week at most - penny size of mucus. Pt denies CP/tightness and f/c/s.      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 He status post IR guided embolization of hemoptysis end 2016.   Last visit feb 2017 he was reporting worsening dyspnea. We checked his IgE. Improved significantly but it was still in the 700s. We started Xolair therapy. He's been doing this now for a few months. He says this has helped his dyspnea symptoms significantly. However at end of 2 weeks he starts getting more symptomatic. Overall he is stable. 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 This no worsening dyspnea on exertion compared to baseline. There are no fevers. Here,  function test today FEV1 is 1.65 L/42% postbronchodilator. This is similar to 2014 but improved compared to 2015. Off note he tells me that he is mild hemoptysis once a month this is stable. He says this is actually been going on since November 2016 when he had his embolization. This no change in this. He knows that he has to monitor his hemoptysis volume. He will have his flu shot today  Last chest x-ray 11/07/2015: Shows hyperinflation personally visualized Last IgE FEb 2017 was 717 and started xolair (January 2013 was 841 and significantly improved from 4000 in  2012)   OV 03/18/2017  Chief Complaint  Patient presents with  . Follow-up    6 mo f/u. Breathing has been the same since the last visit. Denies any increased SOB or chest pains.    Follow-up severe persistent asthma with ABPA and aspergilloma with MAI infection. He status post IR guided embolization for hemoptysis and end of 2016   Overall he has done well. However he says that at his job at [employer] they have been working hard and he is mostly fatigue with very closely. Recently been only sleeping 3 hours per day because of the work. Then in mid April 2018 he have one episode of hemoptysis while at work. With small amount that lasted few to several minutes. It did scare him but he decided to hold off against going to  the emergency department. Since then he's not had any recurrence. He feels stressed and job stress is contributing to this. Most recent CT scan chest as in December 2017 that shows stability documented below. There are no other new issues. He is open to having a transplant referral.  Results for APOSTOLOS, BLAGG (MRN [id number]) as of 03/18/2017 09:41  Ref. Range 10/13/2015 12:40 10/14/2015 02:28 10/15/2015 04:44 10/20/2015 11:38 12/05/2015 12:16 01/16/2016 16:16 04/09/2016 09:43 10/15/2016 15:55  Eosinophils Absolute Latest Ref Range: 15 - 500 cells/uL   0.1  0.1 0.0 196       IMPRESSION: 1. Stable severe biapical  bullous changes and nodular pleural parenchymal scarring.  2. No acute abnormality identified. No evidence of acute infiltrate. No evidence of aspergilloma noted on today's exam.   Electronically Signed   By: Diablo Grande   On: 04/09/2016 10:11  IMPRESSION: 1. Large bullae and upper lung cystic spaces, more thick wall on the left, but without dependent fluid or internal soft tissue masses. Specifically, no evidence of an aspergilloma. 2. There are numerous bilateral lung nodules most of which are calcified, consistent with healed fungal infection/ granulomatous disease. The largest nodule arises from the right lower lobe with an average size of 9 mm. 3. Tree-in-bud type opacities in the posterior medial left lung may reflect active atypical infection or be chronic. 4. There are no focal areas of lung consolidation to suggest lobar pneumonia. No pulmonary edema. 5. No mediastinal or hilar masses or enlarged lymph nodes. 6. Chest CT findings are similar to the most recent prior chest radiographic findings suggesting that all of the lung abnormalities are chronic. Consider follow-up chest CT in 3 months, however, to allow a more accurate comparison for stability.   Electronically Signed   By: Lajean Manes M.D.   On: 10/24/2016 08:37     has a past medical history of 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[place]); Esophageal reflux; Hypothyroidism; Lung disease, bullous ([place]); MAI (mycobacterium avium-intracellulare) ([place]); Mold exposure (12/05/2015); Solar lentigo (08/08/2015); and Weight loss (10/15/2016).   reports that he quit smoking about 9 years ago. His smoking use included Cigarettes. He has a 13.30 pack-year smoking history. He quit smokeless tobacco use about 22 years ago. His smokeless tobacco use included Snuff.  Past Surgical History:  Procedure Laterality Date  . left VATS  2012   thoracotomy and LUL apical posterior segmentectomy  . VIDEO BRONCHOSCOPY Bilateral 10/20/2015    Procedure: VIDEO BRONCHOSCOPY WITHOUT FLUORO;  Surgeon: Marshell Garfinkel, MD;  Location: [place];  Service: Cardiopulmonary;  Laterality: Bilateral;    Allergies  Allergen Reactions  . Clarithromycin [Clarithromycin]     Adverse reaction w/ voriconazole  . Rifaximin [Rifaximin]     Adverse reaction w/ voriconazole    Immunization History  Administered Date(s) Administered  . Influenza Split 10/29/2011, 08/14/2012  . Influenza Whole 09/19/2010  . Influenza,inj,Quad PF,36+ Mos 11/23/2013, 09/28/2014, 09/06/2015, 08/27/2016  . Pneumococcal Polysaccharide-23 12/17/2010    Family History  Problem Relation Age of Onset  . Adopted: Yes     Current Outpatient Prescriptions:  .  albuterol (PROVENTIL HFA;VENTOLIN HFA) 108 (90 Base) MCG/ACT inhaler, Inhale 2 puffs into the lungs every 6 (six) hours as needed., Disp: 1 Inhaler, Rfl: 4 .  alendronate (FOSAMAX) 70 MG tablet, Take 1 tablet (70 mg total) by mouth once a week. Take with a full glass of water on an empty stomach., Disp: 4 tablet, Rfl: 11 .  dextromethorphan-guaiFENesin (TUSSIN DM) 10-100 MG/5ML liquid, Take 10 mLs by mouth every  4 (four) hours as needed for cough., Disp: 118 mL, Rfl: 0 .  EPINEPHrine 0.3 mg/0.3 mL IJ SOAJ injection, Inject 0.3 mLs (0.3 mg total) into the muscle once., Disp: 1 Device, Rfl: 11 .  ethambutol (MYAMBUTOL) 400 MG tablet, TAKE TWO AND ONE-HALF TABLETS BY MOUTH ONCE DAILY, Disp: 225 tablet, Rfl: 5 .  ferrous sulfate (FERRO-BOB) 325 (65 FE) MG tablet, Take 325 mg by mouth daily.  , Disp: , Rfl:  .  fluticasone furoate-vilanterol (BREO ELLIPTA) 100-25 MCG/INH AEPB, Inhale 1 puff into the lungs daily., Disp: 120 each, Rfl: 0 .  levothyroxine (SYNTHROID, LEVOTHROID) 50 MCG tablet, Take 1 tablet by mouth daily., Disp: , Rfl: 0 .  predniSONE (DELTASONE) 10 MG tablet, Take 1 tablet (10 mg total) by mouth daily with breakfast., Disp: 30 tablet, Rfl: 5 .  predniSONE (DELTASONE) 20 MG tablet, TAKE ONE-HALF TABLET  BY MOUTH ONCE DAILY, Disp: 45 tablet, Rfl: 2 .  SPIRIVA HANDIHALER 18 MCG inhalation capsule, PLACE 1 CAPSULE INTO INHALER AND INHALE DAILY, Disp: 30 capsule, Rfl: 1 .  voriconazole (VFEND) 200 MG tablet, Take 1 tablet (200 mg total) by mouth 2 (two) times daily., Disp: 60 tablet, Rfl: 11 .  XOLAIR 150 MG injection, INJECT 375MG SUBCUTANEOUSLY EVERY 2 WEEKS., Disp: 6 each, Rfl: 5  Current Facility-Administered Medications:  .  omalizumab (XOLAIR) injection 375 mg, 375 mg, Subcutaneous, Once, Brand Males, MD .  omalizumab Arvid Right) injection 375 mg, 375 mg, Subcutaneous, Q14 Days, Brand Males, MD, 375 mg at 02/25/17 0910 .  omalizumab Arvid Right) injection 375 mg, 375 mg, Subcutaneous, Q14 Days, Brand Males, MD, 375 mg at 03/11/17 1617   Review of Systems     Objective:   Physical Exam  Constitutional: He is oriented to person, place, and time. He appears well-developed and well-nourished. No distress.  Lean and cachectic as before  HENT:  Head: Normocephalic and atraumatic.  Right Ear: External ear normal.  Left Ear: External ear normal.  Mouth/Throat: Oropharynx is clear and moist. No oropharyngeal exudate.  Eyes: Conjunctivae and EOM are normal. Pupils are equal, round, and reactive to light. Right eye exhibits no discharge. Left eye exhibits no discharge. No scleral icterus.  Neck: Normal range of motion. Neck supple. No JVD present. No tracheal deviation present. No thyromegaly present.  Cardiovascular: Normal rate, regular rhythm and intact distal pulses.  Exam reveals no gallop and no friction rub.   No murmur heard. Pulmonary/Chest: Effort normal and breath sounds normal. No respiratory distress. He has no wheezes. He has no rales. He exhibits no tenderness.  Right upper lobe wheeze  Abdominal: Soft. Bowel sounds are normal. He exhibits no distension and no mass. There is no tenderness. There is no rebound and no guarding.  Musculoskeletal: Normal range of motion. He  exhibits no edema or tenderness.  Lymphadenopathy:    He has no cervical adenopathy.  Neurological: He is alert and oriented to person, place, and time. He has normal reflexes. No cranial nerve deficit. Coordination normal.  Skin: Skin is warm and dry. No rash noted. He is not diaphoretic. No erythema. No pallor.  Psychiatric: He has a normal mood and affect. His behavior is normal. Judgment and thought content normal.  Nursing note and vitals reviewed.   Vitals:   03/18/17 0935  BP: 118/70  Pulse: 85  SpO2: 97%  Weight: 122 lb 3.2 oz (55.4 kg)  Height: _0  (1.753 m)    Estimated body mass index is 18.05 kg/m as calculated from  the following:   Height as of this encounter: _0  (1.753 m).   Weight as of this encounter: 122 lb 3.2 oz (55.4 kg).      Assessment:       ICD-9-CM ICD-10-CM   1. Hemoptysis 786.30 R04.2   2. Severe persistent asthma without complication 978.47 Q41.28   3. ABPA (allergic bronchopulmonary aspergillosis) ([facility]) 518.6 B44.81   4. Mycobacterium avium infection ([place]) 031.0 A31.0   5. 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[facility]) 117.3 B44.9        Plan:       Not sure why you coughed up blood mid April 2018 butu poor sleep can contribute  Plan - If you cough up significant amount of blood please call us immediately or go to ER - Please do chest x-ray 2 view today; will call with results  - Refer lung transplant clinic at [place], [place], prednisone, Xolair and MAC treatment as before   Follow-up  - Do spirometry in 3 months with June Leap - Return to see me in 3 months or sooner if needed   > 50% of this > 25 min visit spent in face to face counseling or coordination of care   Dr. Brand Males, M.D., [facility].C.P Pulmonary and Critical Care Medicine Staff Physician [place] [facility] Pager: [phone], If no answer or between  15:00h - 7:00h: call [phone]  03/18/2017 9:57 AM

## 2017-03-25 ENCOUNTER — Ambulatory Visit (INDEPENDENT_AMBULATORY_CARE_PROVIDER_SITE_OTHER): Payer: BLUE CROSS/BLUE SHIELD

## 2017-03-25 ENCOUNTER — Telehealth: Payer: Self-pay | Admitting: Internal Medicine

## 2017-03-25 DIAGNOSIS — J4540 Moderate persistent asthma, uncomplicated: Secondary | ICD-10-CM

## 2017-03-25 NOTE — Telephone Encounter (Signed)
Pls let [name] know that no changes tob aseline cxr . And  1. Consider referral to [facility] lung transplant team for eval 2. Any significant hemoptysis needs to call us or go to ER  Dr. Brand Males, M.D., [facility].C.P Pulmonary and Critical Care Medicine Staff Physician [place] [facility] Pager: [phone], If no answer or between  15:00h - 7:00h: call [phone]  03/25/2017 3:02 AM      IMPRESSION: Similar appearance of the chest x-ray to prior with advanced architectural distortion/ scarring and large bulla at the lung apices. The reticulonodular appearance of the hilar and suprahilar regions is compatible with prior granulomatous infection, however, acute superimposed inflammation/infection difficult to exclude.  Chest x-ray is negative for confluent airspace disease to suggest consolidation/ lobar pneumonia.  Surgical changes at the left lung base.   Electronically Signed By: Corrie Mckusick D.O. On: 03/18/2017 10:52

## 2017-03-26 MED ORDER — OMALIZUMAB 150 MG ~~LOC~~ SOLR
375.0000 mg | SUBCUTANEOUS | Status: DC
  Administered 2017-03-25: 375 mg via SUBCUTANEOUS

## 2017-03-26 NOTE — Telephone Encounter (Signed)
Called and spoke to pt. Informed him of the results per MR. Pt verbalized understanding and states he has a [name] appt on 6/4 and 6/5.   Will forward to MR as FYI.

## 2017-03-26 NOTE — Progress Notes (Signed)
Xolair injection documentation and charges entered by Maury Dus, RMA, based on injection sheet filled out by Alroy Bailiff per office protocol.

## 2017-03-29 ENCOUNTER — Telehealth: Payer: Self-pay | Admitting: Internal Medicine

## 2017-03-29 NOTE — Telephone Encounter (Signed)
#  vials:6 Ordered date:03/29/17 Shipping Date:04/02/17

## 2017-04-02 NOTE — Telephone Encounter (Signed)
First order is void, [facility] called and said it was too soon to order, reordered 04/02/17. # vials:6 Ordered date:04/02/17 Shipping Date:04/04/17

## 2017-04-03 ENCOUNTER — Telehealth: Payer: Self-pay | Admitting: Internal Medicine

## 2017-04-03 NOTE — Telephone Encounter (Signed)
lmtcb x1 for pt.

## 2017-04-03 NOTE — Telephone Encounter (Signed)
That patient is currently just undergoine lung transplant so wont be able to talk . Hvin said that I think he should go see [name] and talk to them first. IF they think he is a good candidate and then he has some concerns about it, they will sure refer him to transplant survivors to describe the process. IF at that point he still does not know who to talk to I can refer him to this patient who should be improved by then  Dr. Brand Males, M.D., [facility].C.P Pulmonary and Critical Care Medicine Staff Physician [place] [facility] Pager: [phone], If no answer or between  15:00h - 7:00h: call [phone]  04/03/2017 6:02 PM

## 2017-04-03 NOTE — Telephone Encounter (Signed)
Spoke with the pt  He recalls MR telling him about another pt who had a similar case and had lung transplant He is wondering if MR would give that pt his phone number and see if that pt can call him  He wants to discuss the procedure with someone who has been through it  I am not sure if this would be appropriate, but have never had a pt ask this  What do you recommend? Please advise, thanks!

## 2017-04-03 NOTE — Telephone Encounter (Signed)
Patient returned phone call...ert

## 2017-04-04 DIAGNOSIS — B449 Aspergillosis, unspecified: Secondary | ICD-10-CM | POA: Diagnosis not present

## 2017-04-04 DIAGNOSIS — J4540 Moderate persistent asthma, uncomplicated: Secondary | ICD-10-CM | POA: Diagnosis not present

## 2017-04-04 DIAGNOSIS — A310 Pulmonary mycobacterial infection: Secondary | ICD-10-CM | POA: Diagnosis not present

## 2017-04-04 DIAGNOSIS — B4481 Allergic bronchopulmonary aspergillosis: Secondary | ICD-10-CM | POA: Diagnosis not present

## 2017-04-04 NOTE — Telephone Encounter (Signed)
Called and spoke with pt and he is aware of MR recs.

## 2017-04-08 ENCOUNTER — Ambulatory Visit: Payer: Self-pay

## 2017-04-08 ENCOUNTER — Ambulatory Visit: Payer: BLUE CROSS/BLUE SHIELD | Admitting: Infectious Disease

## 2017-04-08 ENCOUNTER — Ambulatory Visit (INDEPENDENT_AMBULATORY_CARE_PROVIDER_SITE_OTHER): Payer: BLUE CROSS/BLUE SHIELD | Admitting: Infectious Disease

## 2017-04-08 ENCOUNTER — Encounter: Payer: Self-pay | Admitting: Infectious Disease

## 2017-04-08 VITALS — BP 125/83 | HR 79 | Wt 122.0 lb

## 2017-04-08 DIAGNOSIS — B4481 Allergic bronchopulmonary aspergillosis: Secondary | ICD-10-CM | POA: Diagnosis not present

## 2017-04-08 DIAGNOSIS — A310 Pulmonary mycobacterial infection: Secondary | ICD-10-CM

## 2017-04-08 DIAGNOSIS — B449 Aspergillosis, unspecified: Secondary | ICD-10-CM

## 2017-04-08 DIAGNOSIS — B440 Invasive pulmonary aspergillosis: Secondary | ICD-10-CM | POA: Diagnosis not present

## 2017-04-08 LAB — COMPLETE METABOLIC PANEL WITH GFR
ALBUMIN: 3.7 g/dL (ref 3.6–5.1)
ALK PHOS: 76 U/L (ref 40–115)
ALT: 9 U/L (ref 9–46)
AST: 14 U/L (ref 10–40)
BUN: 7 mg/dL (ref 7–25)
CALCIUM: 8.6 mg/dL (ref 8.6–10.3)
CO2: 29 mmol/L (ref 20–31)
CREATININE: 0.78 mg/dL (ref 0.60–1.35)
Chloride: 103 mmol/L (ref 98–110)
GFR, Est African American: >89 mL/min (ref >=60)
GFR, Est Non African American: >89 mL/min (ref >=60)
GLUCOSE: 65 mg/dL (ref 65–99)
Potassium: 3.7 mmol/L (ref 3.5–5.3)
Sodium: 139 mmol/L (ref 135–146)
TOTAL PROTEIN: 6.1 g/dL (ref 6.1–8.1)
Total Bilirubin: 0.4 mg/dL (ref 0.2–1.2)

## 2017-04-08 NOTE — Progress Notes (Signed)
Mycobacterium avium infection and aspergillosis with recent hx of coughing up blood in April   Subjective:  Chief complaint:  : Patient ID: [name], male    DOB: 01-24-1976, 41 y.o.   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 We had taken him off his Mycobacterium avium drugs and then he developed fevers chills and malaise  Spring (2013) and was seen by my partner Dr. Megan Salon in late April  His AFB smears and cultures obtained in the clinic on that day prior to the initiation of azithromycin ethambutol ultimately failed to grow Mycobacterium avium. He had been doing well I saw him in [place] 2013 we discontinued his azithromycin and ethambutol again. Unfortunately he again shortly thereafter developed fevers cough malaise. He started back on azithromycin ethambutol. Cultures done for AFB here were again negative. The patient however felt dramatically better having been back on azithromycin and ethambutol.   I last saw him in January of 2015 when he was feeling relatively well.  He did have an episode of coughing up a few flecks of blood in January 2016 which prompted appt with [facility] but by time he made appt this had long since resolved. Was only a one time episode none since then.  I saw again in March and then in September 2016. In summer he was hospitalized with hemoptysis and found to have an area in the upper lobe consistent with an aspergilloma. He underwent bronchoscopy with BAL and cultures ultimately yielded a Saccharomyces species on fungal culture. His serum galactomannan was negative.  He has been  on voriconazole and axial has had radiographic improvement and his x-ray  findings. He also feelt etter symptomatically with no more hemoptysis improvement in his cough improvement in his energy though he still feels not nearly as well as he did prior to his hospitalization. He is avoiding exercised during the winter as it often precipitates coughing spells. He was  only taking 200 mg twice a day of voriconazole rather than the 300 mg he has continued this along with his ethambutol and azithromycin. He continued to have cough more so with exercise and has not been exercising at "[employer]" due to the fact this elicits more coughing. He has not smoked since 2007. He is seeing [name] and is being considered for new drug infusion to treat his COPD.  He did have 20# weight loss that he attributed to increased exercise.  Since his last visit with me he is seen [name] after an episode of hematemesis. 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 He said he only coughed up a small amount of blood material and did not go to the ER but was seen and followed by pulmonary. Had a chest x-ray which showed no new findings.  He is going to be evaluated by the transplant team at [facility].  He does complain of hyperpigmented spots on sun exposed areas on his body including arms and neck which his dermatologist have told him is due to his voriconazole use. We did discuss other options besides voriconazole such as itraconazole or posaconazole. I would not like to make such a change to one of these drugs at this point given  multiple issues. #1 each is getting ready to be evaluated by [facility]. #2 we had a lot of issues getting authorization for his voriconazole. #3 will have to deal with drug interactions that may be slightly different for example with itraconazole.  Past Medical History:  Diagnosis Date  . 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[place])   . Esophageal reflux   . 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 Lung disease, bullous ([place])    . MAI (mycobacterium avium-intracellulare) ([place])   . Mold exposure 12/05/2015  . Solar lentigo 08/08/2015  . Weight loss 10/15/2016   Past Surgical History:  Procedure Laterality Date  . left VATS  2012   thoracotomy and LUL apical posterior segmentectomy  . VIDEO BRONCHOSCOPY Bilateral 10/20/2015   Procedure: VIDEO BRONCHOSCOPY WITHOUT FLUORO;  Surgeon: Marshell Garfinkel, MD;  Location: [facility];  Service: Cardiopulmonary;  Laterality: Bilateral;   Family History  Problem Relation Age of Onset  . Adopted: Yes   Social History  Substance Use Topics  . Smoking status: Former Smoker    Packs/day: 0.70    Years: 19.00    Types: Cigarettes    Quit date: 11/20/2007  . Smokeless tobacco: Former Systems developer    Types: Snuff    Quit date: 11/19/1994  . Alcohol use No    Allergies  Allergen Reactions  . Clarithromycin [Clarithromycin]     Adverse reaction w/ voriconazole  . Rifaximin [Rifaximin]     Adverse reaction w/ voriconazole    Current Outpatient Prescriptions:  .  albuterol (PROVENTIL HFA;VENTOLIN HFA) 108 (90 Base) MCG/ACT inhaler, Inhale 2 puffs into the lungs every 6 (six) hours as needed., Disp: 1 Inhaler, Rfl: 4 .  alendronate (FOSAMAX) 70 MG tablet, Take 1 tablet (70 mg total) by mouth once a week. Take with a full glass of water on an empty stomach., Disp: 4 tablet, Rfl: 11 .  dextromethorphan-guaiFENesin (TUSSIN DM) 10-100 MG/5ML liquid, Take 10 mLs by mouth every 4 (four) hours as needed for cough., Disp: 118 mL, Rfl: 0 .  EPINEPHrine 0.3 mg/0.3 mL IJ SOAJ injection, Inject 0.3 mLs (0.3 mg total) into the muscle once., Disp: 1 Device, Rfl: 11 .  ethambutol (MYAMBUTOL) 400 MG tablet, TAKE TWO AND ONE-HALF TABLETS BY MOUTH ONCE DAILY, Disp: 225 tablet, Rfl: 5 .  ferrous sulfate (FERRO-BOB) 325 (65 FE) MG tablet, Take 325 mg by mouth daily.  , Disp: , Rfl:  .  fluticasone furoate-vilanterol (BREO ELLIPTA) 100-25 MCG/INH AEPB, Inhale 1 puff into the lungs daily., Disp: 120 each,  Rfl: 0 .  levothyroxine (SYNTHROID, LEVOTHROID) 50 MCG tablet, Take 1 tablet by mouth daily., Disp: , Rfl: 0 .  predniSONE (DELTASONE) 10 MG tablet, Take 1 tablet (10 mg total) by mouth daily with breakfast., Disp: 30 tablet, Rfl: 5 .  predniSONE (DELTASONE) 20 MG tablet, TAKE ONE-HALF TABLET BY MOUTH ONCE DAILY, Disp: 45 tablet, Rfl: 2 .  SPIRIVA HANDIHALER 18 MCG inhalation capsule, PLACE 1 CAPSULE INTO INHALER AND INHALE DAILY, Disp: 30 capsule, Rfl: 1 .  voriconazole (VFEND) 200 MG tablet, Take 1 tablet (200 mg total) by mouth 2 (two) times daily., Disp: 60 tablet, Rfl: 11 .  XOLAIR 150 MG injection, INJECT 375MG SUBCUTANEOUSLY EVERY 2 WEEKS., Disp: 6 each, Rfl: 5  Current Facility-Administered Medications:  .  omalizumab (XOLAIR) injection 375 mg, 375 mg, Subcutaneous, Once, Ramaswamy, Murali, MD .  omalizumab Arvid Right) injection 375 mg, 375 mg, Subcutaneous, Q14 Days, Ramaswamy, Belva Crome, MD, 375 mg at 02/25/17 0910 .  omalizumab Arvid Right) injection 375 mg, 375 mg, Subcutaneous, Q14 Days, Brand Males, MD, 375 mg at 03/11/17 1617 .  omalizumab Arvid Right) injection 375 mg, 375 mg, Subcutaneous, Q14 Days, Ramaswamy, Murali, MD, 375 mg at 03/25/17 0840    Review of Systems  Constitutional: Negative for activity change, appetite change, chills, diaphoresis, fatigue, fever and unexpected weight change.  HENT: Negative for congestion, rhinorrhea, sinus pressure, sneezing, sore throat and trouble swallowing.   Eyes: Negative for photophobia and visual disturbance.  Respiratory: Positive for cough and shortness of breath. Negative for chest tightness, wheezing and stridor.   Cardiovascular: Negative for chest pain, palpitations and leg swelling.  Gastrointestinal: Negative for abdominal distention, abdominal pain, anal bleeding, blood in stool, constipation, diarrhea, nausea and vomiting.  Genitourinary: Negative for difficulty urinating, dysuria, flank pain and hematuria.  Musculoskeletal:  Negative for arthralgias, back pain, gait problem, joint swelling and myalgias.  Skin: Positive for rash. Negative for color change, pallor and wound.  Neurological: Negative for dizziness, tremors, weakness and light-headedness.  Hematological: Negative for adenopathy. Does not bruise/bleed easily.  Psychiatric/Behavioral: Negative for agitation, behavioral problems, confusion, decreased concentration, dysphoric mood and sleep disturbance.       Objective:   Physical Exam  Constitutional: He is oriented to person, place, and time. He appears well-nourished. No distress.  HENT:  Head: Normocephalic and atraumatic.  Eyes: Conjunctivae and EOM are normal. Pupils are equal, round, and reactive to light. No scleral icterus.  Neck: Normal range of motion. Neck supple.  Cardiovascular: Normal rate, regular rhythm and normal heart sounds.  Exam reveals no gallop and no friction rub.   No murmur heard. Pulmonary/Chest: No respiratory distress. He has decreased breath sounds in the right lower field.  Prolonged expiratory phase  Abdominal: He exhibits no distension and no mass. There is no tenderness. There is no rebound and no guarding.  Musculoskeletal: He exhibits no edema or tenderness.  Lymphadenopathy:    He has no cervical adenopathy.  Neurological: He is alert and oriented to person, place, and time. He exhibits normal muscle tone. Coordination normal.  Skin: Skin is warm and dry. He is not diaphoretic. No erythema. No pallor.     Psychiatric: He has a normal mood and affect. His behavior is normal. Judgment and thought content normal.    Hyperpigmented area on neck C picture 04/08/2017:          Assessment & Plan:   Aspergilloma: continue lifelong antifungal therapy for now wit hvoriconazole and check VFEND level with CBC and  CMP today note he normally takes the voriconazole at 7 PM and 7 AM but last night took it at 9 AM.   Allergic bronchopulmonary aspgergillosis: As  above, lifelong voriconazole steroids per Pulmonary medicine.   Mycobacterium avium pulmonary infection: We have been reluctant to stop these medications due to prior flares when he comes off the medication indications. He is not interested in stopping them at all  Involuntary weight loss: We did get a CT scan of the lungs reviewed in December.  I spent greater than 25  minutes with the patient including greater than 50% of time in face to face counsel of the patient regarding his aspergilloma allergic bronchopulmonary aspergillosis and Mycobacterium avium infections , weight loss, evaluation by [name] for possible lung transplantation and in coordination of his care.

## 2017-04-09 ENCOUNTER — Ambulatory Visit (INDEPENDENT_AMBULATORY_CARE_PROVIDER_SITE_OTHER): Payer: BLUE CROSS/BLUE SHIELD

## 2017-04-09 DIAGNOSIS — J4550 Severe persistent asthma, uncomplicated: Secondary | ICD-10-CM

## 2017-04-09 NOTE — Telephone Encounter (Signed)
#  Vials:6 Arrival Date:05/06/17 received by [facility] recorded by [facility]BS 04/09/17( I was off) Lot #:[id number] Exp Date:10/21

## 2017-04-10 ENCOUNTER — Other Ambulatory Visit: Payer: Self-pay | Admitting: *Deleted

## 2017-04-10 DIAGNOSIS — B4481 Allergic bronchopulmonary aspergillosis: Secondary | ICD-10-CM

## 2017-04-10 DIAGNOSIS — A310 Pulmonary mycobacterial infection: Secondary | ICD-10-CM

## 2017-04-10 MED ORDER — ETHAMBUTOL HCL 400 MG PO TABS: ORAL_TABLET | ORAL | 3 refills | Status: DC

## 2017-04-10 MED ORDER — VORICONAZOLE 200 MG PO TABS: 200.0000 mg | ORAL_TABLET | Freq: Two times a day (BID) | ORAL | 11 refills | Status: DC

## 2017-04-11 LAB — VORICONAZOLE QUANT BY LC/MS: Voriconazole, Quant, by LC/MS: 1.2 ug/mL

## 2017-04-12 ENCOUNTER — Other Ambulatory Visit: Payer: Self-pay | Admitting: *Deleted

## 2017-04-12 DIAGNOSIS — J4550 Severe persistent asthma, uncomplicated: Secondary | ICD-10-CM | POA: Diagnosis not present

## 2017-04-12 DIAGNOSIS — B4481 Allergic bronchopulmonary aspergillosis: Secondary | ICD-10-CM

## 2017-04-12 DIAGNOSIS — A310 Pulmonary mycobacterial infection: Secondary | ICD-10-CM

## 2017-04-12 MED ORDER — VORICONAZOLE 200 MG PO TABS: 200.0000 mg | ORAL_TABLET | Freq: Two times a day (BID) | ORAL | 3 refills | Status: DC

## 2017-04-12 MED ORDER — OMALIZUMAB 150 MG ~~LOC~~ SOLR
375.0000 mg | Freq: Once | SUBCUTANEOUS | Status: AC
  Administered 2017-04-12: 375 mg via SUBCUTANEOUS

## 2017-04-12 MED ORDER — ETHAMBUTOL HCL 400 MG PO TABS: ORAL_TABLET | ORAL | 3 refills | Status: DC

## 2017-04-12 MED ORDER — OMALIZUMAB 150 MG ~~LOC~~ SOLR: 150.0000 mg | Freq: Once | SUBCUTANEOUS | Status: DC

## 2017-04-17 ENCOUNTER — Ambulatory Visit: Payer: BLUE CROSS/BLUE SHIELD | Admitting: Infectious Disease

## 2017-04-22 ENCOUNTER — Ambulatory Visit: Payer: Self-pay

## 2017-04-22 ENCOUNTER — Other Ambulatory Visit: Payer: Self-pay | Admitting: Infectious Disease

## 2017-04-22 DIAGNOSIS — A310 Pulmonary mycobacterial infection: Secondary | ICD-10-CM

## 2017-04-29 DIAGNOSIS — F54 Psychological and behavioral factors associated with disorders or diseases classified elsewhere: Secondary | ICD-10-CM | POA: Diagnosis not present

## 2017-04-29 DIAGNOSIS — F4323 Adjustment disorder with mixed anxiety and depressed mood: Secondary | ICD-10-CM | POA: Diagnosis not present

## 2017-04-29 DIAGNOSIS — B4481 Allergic bronchopulmonary aspergillosis: Secondary | ICD-10-CM | POA: Diagnosis not present

## 2017-04-29 DIAGNOSIS — A310 Pulmonary mycobacterial infection: Secondary | ICD-10-CM | POA: Diagnosis not present

## 2017-04-29 DIAGNOSIS — Z008 Encounter for other general examination: Secondary | ICD-10-CM | POA: Diagnosis not present

## 2017-04-29 DIAGNOSIS — J438 Other emphysema: Secondary | ICD-10-CM | POA: Diagnosis not present

## 2017-04-29 DIAGNOSIS — Z7682 Awaiting organ transplant status: Secondary | ICD-10-CM | POA: Diagnosis not present

## 2017-04-30 DIAGNOSIS — Z87891 Personal history of nicotine dependence: Secondary | ICD-10-CM | POA: Diagnosis not present

## 2017-04-30 DIAGNOSIS — Z7682 Awaiting organ transplant status: Secondary | ICD-10-CM | POA: Diagnosis not present

## 2017-04-30 DIAGNOSIS — M438X4 Other specified deforming dorsopathies, thoracic region: Secondary | ICD-10-CM | POA: Diagnosis not present

## 2017-04-30 DIAGNOSIS — R918 Other nonspecific abnormal finding of lung field: Secondary | ICD-10-CM | POA: Diagnosis not present

## 2017-04-30 DIAGNOSIS — B449 Aspergillosis, unspecified: Secondary | ICD-10-CM | POA: Diagnosis not present

## 2017-04-30 DIAGNOSIS — Z01818 Encounter for other preprocedural examination: Secondary | ICD-10-CM | POA: Diagnosis not present

## 2017-04-30 DIAGNOSIS — Z008 Encounter for other general examination: Secondary | ICD-10-CM | POA: Diagnosis not present

## 2017-04-30 DIAGNOSIS — A319 Mycobacterial infection, unspecified: Secondary | ICD-10-CM | POA: Diagnosis not present

## 2017-04-30 DIAGNOSIS — J438 Other emphysema: Secondary | ICD-10-CM | POA: Diagnosis not present

## 2017-04-30 DIAGNOSIS — A310 Pulmonary mycobacterial infection: Secondary | ICD-10-CM | POA: Diagnosis not present

## 2017-04-30 DIAGNOSIS — B4481 Allergic bronchopulmonary aspergillosis: Secondary | ICD-10-CM | POA: Diagnosis not present

## 2017-05-06 ENCOUNTER — Ambulatory Visit: Payer: Self-pay

## 2017-05-06 ENCOUNTER — Ambulatory Visit (INDEPENDENT_AMBULATORY_CARE_PROVIDER_SITE_OTHER): Payer: BLUE CROSS/BLUE SHIELD

## 2017-05-06 DIAGNOSIS — J4550 Severe persistent asthma, uncomplicated: Secondary | ICD-10-CM | POA: Diagnosis not present

## 2017-05-08 ENCOUNTER — Telehealth: Payer: Self-pay | Admitting: Internal Medicine

## 2017-05-08 MED ORDER — OMALIZUMAB 150 MG ~~LOC~~ SOLR
375.0000 mg | Freq: Once | SUBCUTANEOUS | Status: AC
  Administered 2017-05-06: 375 mg via SUBCUTANEOUS

## 2017-05-08 NOTE — Telephone Encounter (Addendum)
#  vials:6 Ordered date:05/08/17 Shipping Date:05/14/17 Order will be in 06/15/17 per rep. I spoke with at [facility]Marland Kitchen

## 2017-05-14 DIAGNOSIS — B4481 Allergic bronchopulmonary aspergillosis: Secondary | ICD-10-CM | POA: Diagnosis not present

## 2017-05-14 DIAGNOSIS — J4540 Moderate persistent asthma, uncomplicated: Secondary | ICD-10-CM | POA: Diagnosis not present

## 2017-05-14 DIAGNOSIS — A310 Pulmonary mycobacterial infection: Secondary | ICD-10-CM | POA: Diagnosis not present

## 2017-05-14 DIAGNOSIS — B449 Aspergillosis, unspecified: Secondary | ICD-10-CM | POA: Diagnosis not present

## 2017-05-16 NOTE — Telephone Encounter (Signed)
#  Vials:6 Arrival Date:05/16/17 Lot #:[id number] Exp Date:11/21

## 2017-05-20 ENCOUNTER — Ambulatory Visit (INDEPENDENT_AMBULATORY_CARE_PROVIDER_SITE_OTHER): Payer: BLUE CROSS/BLUE SHIELD

## 2017-05-20 DIAGNOSIS — J4540 Moderate persistent asthma, uncomplicated: Secondary | ICD-10-CM | POA: Diagnosis not present

## 2017-05-21 MED ORDER — OMALIZUMAB 150 MG ~~LOC~~ SOLR
375.0000 mg | SUBCUTANEOUS | Status: DC
  Administered 2017-05-20: 375 mg via SUBCUTANEOUS

## 2017-05-21 NOTE — Progress Notes (Signed)
Xolair injection documentation and charges entered by Kimie Pidcock, RMA, based on injection sheet filled out by Tammy Scott per office protocol.   

## 2017-06-03 ENCOUNTER — Ambulatory Visit (INDEPENDENT_AMBULATORY_CARE_PROVIDER_SITE_OTHER): Payer: BLUE CROSS/BLUE SHIELD

## 2017-06-03 DIAGNOSIS — J4540 Moderate persistent asthma, uncomplicated: Secondary | ICD-10-CM | POA: Diagnosis not present

## 2017-06-03 MED ORDER — OMALIZUMAB 150 MG ~~LOC~~ SOLR
375.0000 mg | SUBCUTANEOUS | Status: DC
  Administered 2017-06-03: 375 mg via SUBCUTANEOUS

## 2017-06-06 ENCOUNTER — Other Ambulatory Visit: Payer: Self-pay | Admitting: Internal Medicine

## 2017-06-06 ENCOUNTER — Other Ambulatory Visit: Payer: Self-pay | Admitting: Infectious Disease

## 2017-06-06 ENCOUNTER — Telehealth: Payer: Self-pay | Admitting: Internal Medicine

## 2017-06-06 DIAGNOSIS — A310 Pulmonary mycobacterial infection: Secondary | ICD-10-CM

## 2017-06-06 NOTE — Telephone Encounter (Signed)
#  vials:6 Ordered date:06/06/17 Shipping Date:06/10/17

## 2017-06-10 ENCOUNTER — Telehealth: Payer: Self-pay | Admitting: *Deleted

## 2017-06-10 NOTE — Telephone Encounter (Signed)
[facility] rep called previous order was ordered too soon.  Reorder: # vials:6 Ordered date:06/10/17 Shipping Date:06/13/17 Del. 06/14/17

## 2017-06-10 NOTE — Telephone Encounter (Signed)
Patient requesting rx to be transferred from [facility].  [facility] to call [facility] to transfer rx, voriconazole, for the patient.

## 2017-06-13 DIAGNOSIS — J4540 Moderate persistent asthma, uncomplicated: Secondary | ICD-10-CM | POA: Diagnosis not present

## 2017-06-13 DIAGNOSIS — M818 Other osteoporosis without current pathological fracture: Secondary | ICD-10-CM | POA: Diagnosis not present

## 2017-06-13 DIAGNOSIS — B4481 Allergic bronchopulmonary aspergillosis: Secondary | ICD-10-CM | POA: Diagnosis not present

## 2017-06-13 DIAGNOSIS — K219 Gastro-esophageal reflux disease without esophagitis: Secondary | ICD-10-CM | POA: Diagnosis not present

## 2017-06-13 DIAGNOSIS — E039 Hypothyroidism, unspecified: Secondary | ICD-10-CM | POA: Diagnosis not present

## 2017-06-13 DIAGNOSIS — B49 Unspecified mycosis: Secondary | ICD-10-CM | POA: Diagnosis not present

## 2017-06-13 DIAGNOSIS — A310 Pulmonary mycobacterial infection: Secondary | ICD-10-CM | POA: Diagnosis not present

## 2017-06-13 DIAGNOSIS — B449 Aspergillosis, unspecified: Secondary | ICD-10-CM | POA: Diagnosis not present

## 2017-06-14 NOTE — Telephone Encounter (Signed)
#  Vials:6 Arrival Date:06/14/17 Lot #:[id number] Exp Date:09/2020

## 2017-06-17 ENCOUNTER — Ambulatory Visit (INDEPENDENT_AMBULATORY_CARE_PROVIDER_SITE_OTHER): Payer: BLUE CROSS/BLUE SHIELD

## 2017-06-17 DIAGNOSIS — J4520 Mild intermittent asthma, uncomplicated: Secondary | ICD-10-CM | POA: Diagnosis not present

## 2017-06-18 MED ORDER — OMALIZUMAB 150 MG ~~LOC~~ SOLR
375.0000 mg | SUBCUTANEOUS | Status: DC
  Administered 2017-06-17: 375 mg via SUBCUTANEOUS

## 2017-06-18 NOTE — Progress Notes (Signed)
Documentation of medication administration and charges of Xolair have been completed by Dre Gamino, CMA based on the Xolair documentation sheet completed by Tammy Scott.  

## 2017-06-25 ENCOUNTER — Encounter: Payer: Self-pay | Admitting: Internal Medicine

## 2017-06-25 ENCOUNTER — Ambulatory Visit (INDEPENDENT_AMBULATORY_CARE_PROVIDER_SITE_OTHER): Payer: BLUE CROSS/BLUE SHIELD | Admitting: Internal Medicine

## 2017-06-25 VITALS — BP 116/72 | HR 67 | Ht 69.0 in | Wt 130.0 lb

## 2017-06-25 DIAGNOSIS — B449 Aspergillosis, unspecified: Secondary | ICD-10-CM | POA: Diagnosis not present

## 2017-06-25 DIAGNOSIS — B4481 Allergic bronchopulmonary aspergillosis: Secondary | ICD-10-CM

## 2017-06-25 LAB — PULMONARY FUNCTION TEST
FEF 25-75 PRE: 0.48 L/s
FEF2575-%PRED-PRE: 12 %
FEV1-%PRED-PRE: 28 %
FEV1FVC-%PRED-PRE: 57 %
FEV6FVC-%PRED-PRE: 96 %
FVC-PRE: 2.52 L
PRE FEV1/FVC RATIO: 46 %

## 2017-06-25 NOTE — Progress Notes (Signed)
Subjective:     Patient ID: [name], male   DOB: 03/24/1976, 41 y.o.   MRN: [id number]  HPI  PCP No PCP Per Patient   HPI   OV 01/16/2016  Chief Complaint  Patient presents with  . Follow-up    Pt denies any increased dyspnea at this time - states that it comes and goes with activity. Pt states it is different on a daily basis. Denies hemoptysis since last OV.   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 Since then he overall feels that his dyspnea is not back to baseline. He feels he has had a new lower baseline. He has more fatigued than usual. But he is stable without any exacerbation. He feels in the next few years ago started to become permanently disabled. He currently works as a Biomedical scientist. There are no fevers.   Last chest x-ray 11/07/2015: Shows hyperinflation personally visualized Last IgE January 2013 was 841 and significantly improved from 4000 2012.  Last CBC January 2017 with normal used to flows are 100 cells per cubic millimeter.   OV 08/27/2016  Chief Complaint  Patient presents with  . Follow-up    Pt states his cough and SOB is at baseline. Pt states he has hemoptysis around once a week at most - penny size of mucus. Pt denies CP/tightness and f/c/s.      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 He status post IR guided embolization of hemoptysis end 2016.   Last visit feb 2017 he was reporting worsening dyspnea. We checked his IgE. Improved significantly but it was still in the 700s. We started Xolair therapy. He's been doing this now for a few months. He says this has helped his dyspnea symptoms significantly. However at end of 2 weeks he starts getting more symptomatic. Overall he is stable. 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 This no worsening dyspnea on exertion compared to baseline. There are no fevers. Here,  function test today FEV1 is 1.65 L/42% postbronchodilator. This is similar to 2014 but improved compared to 2015. Off note he tells me that he is mild hemoptysis once a month this is stable. He says this is actually been going on since November 2016 when he had his embolization. This no change in this. He knows that he has to monitor his hemoptysis volume. He will have his flu shot today  Last chest x-ray 11/07/2015: Shows hyperinflation personally visualized Last IgE FEb 2017 was 717 and started xolair (January 2013 was 841 and significantly improved from 4000 in  2012)   OV 03/18/2017  Chief Complaint  Patient presents with  . Follow-up    6 mo f/u. Breathing has been the same since the last visit. Denies any increased SOB or chest pains.    Follow-up severe persistent asthma with ABPA and aspergilloma with MAI infection. He status post IR guided embolization for hemoptysis and end of 2016   Overall he has done well. However he says that at his job at [employer] they have been working hard and he is mostly fatigue with very closely. Recently been only sleeping 3 hours per day because of the work. Then in mid April 2018 he have one episode of hemoptysis while at work. With small amount that lasted few to several minutes. It did scare him but he decided to hold off against going to  the emergency department. Since then he's not had any recurrence. He feels stressed and job stress is contributing to this. Most recent CT scan chest as in December 2017 that shows stability documented below. There are no other new issues. He is open to having a transplant referral.   OV 06/25/2017  Chief Complaint  Patient presents with  . Follow-up    Pt had a PFT done today and is here to find out the results. Pt has no complaints or concerns. Denies any cough, SOB, or  CP.    Follow-up severe persistent asthma with ABPA and aspergilloma with MAI infection. He status post IR guided embolization for  hemoptysis and end of 2016  I do not think he is on MAI treatment anymore. He is on voriconazole, Xolair and asthma medications. Since last visit he has not had any hemoptysis. In the interim he did see [name] and saw them for a few days. He says he is not a transplant candidate based on financial issues, social issues and the fact that he has aspergilloma and is not on oxygen. His 5 has been closed according to his report. Overall he is doing stable. He does not have the fatigue anymore. We reviewed his lung function studies and it shows decline although it is a prebronchodilator test. He says that he actually feels better than he was. There are no other new issues.   Results for Brett, Farmer (MRN [id number]) as of 06/25/2017 09:45  Ref. Range 08/06/2016 09:09 06/25/2017 09:11  FEV1-Pre Latest Units: L 1.50 1.16  FEV1-%Pred-Pre Latest Units: % 39 28    Results for Brett, Farmer (MRN [id number]) as of 03/18/2017 09:41  Ref. Range 10/13/2015 12:40 10/14/2015 02:28 10/15/2015 04:44 10/20/2015 11:38 12/05/2015 12:16 01/16/2016 16:16 04/09/2016 09:43 10/15/2016 15:55  Eosinophils Absolute Latest Ref Range: 15 - 500 cells/uL   0.1  0.1 0.0 196       IMPRESSION: 1. Stable severe biapical bullous changes and nodular pleural parenchymal scarring.  2. No acute abnormality identified. No evidence of acute infiltrate. No evidence of aspergilloma noted on today's exam.   Electronically Signed   By: [place]   On: 04/09/2016 10:11  IMPRESSION: 1. Large bullae and upper lung cystic spaces, more thick wall on the left, but without dependent fluid or internal soft tissue masses. Specifically, no evidence of an aspergilloma. 2. There are numerous bilateral lung nodules most of which are calcified, consistent with healed fungal infection/ granulomatous disease. The largest nodule arises from the right lower lobe with an average size of 9 mm. 3. Tree-in-bud type opacities in the  posterior medial left lung may reflect active atypical infection or be chronic. 4. There are no focal areas of lung consolidation to suggest lobar pneumonia. No pulmonary edema. 5. No mediastinal or hilar masses or enlarged lymph nodes. 6. Chest CT findings are similar to the most recent prior chest radiographic findings suggesting that all of the lung abnormalities are chronic. Consider follow-up chest CT in 3 months, however, to allow a more accurate comparison for stability.   Electronically Signed   By: Lajean Manes M.D.   On: 10/24/2016 08:37    has a past medical history of 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[place]); Esophageal reflux; Hypothyroidism; Lung disease, bullous ([place]); MAI (mycobacterium avium-intracellulare) ([place]); Mold exposure (12/05/2015); Solar lentigo (08/08/2015); and Weight loss (10/15/2016).   reports that he quit smoking about 9 years ago. His smoking use included Cigarettes. He has a 13.30 pack-year smoking history. He  quit smokeless tobacco use about 22 years ago. His smokeless tobacco use included Snuff.  Past Surgical History:  Procedure Laterality Date  . left VATS  2012   thoracotomy and LUL apical posterior segmentectomy  . VIDEO BRONCHOSCOPY Bilateral 10/20/2015   Procedure: VIDEO BRONCHOSCOPY WITHOUT FLUORO;  Surgeon: Marshell Garfinkel, MD;  Location: [place];  Service: Cardiopulmonary;  Laterality: Bilateral;    Allergies  Allergen Reactions  . Clarithromycin [Clarithromycin]     Adverse reaction w/ voriconazole  . Rifaximin [Rifaximin]     Adverse reaction w/ voriconazole    Immunization History  Administered Date(s) Administered  . Influenza Split 10/29/2011, 08/14/2012  . Influenza Whole 09/19/2010  . Influenza,inj,Quad PF,36+ Mos 11/23/2013, 09/28/2014, 09/06/2015, 08/27/2016  . Pneumococcal Polysaccharide-23 12/17/2010    Family History  Problem Relation Age of Onset  . Adopted: Yes     Current Outpatient Prescriptions:  .  albuterol (PROVENTIL  HFA;VENTOLIN HFA) 108 (90 Base) MCG/ACT inhaler, Inhale 2 puffs into the lungs every 6 (six) hours as needed., Disp: 1 Inhaler, Rfl: 4 .  alendronate (FOSAMAX) 70 MG tablet, Take 1 tablet (70 mg total) by mouth once a week. Take with a full glass of water on an empty stomach., Disp: 4 tablet, Rfl: 11 .  dextromethorphan-guaiFENesin (TUSSIN DM) 10-100 MG/5ML liquid, Take 10 mLs by mouth every 4 (four) hours as needed for cough., Disp: 118 mL, Rfl: 0 .  EPINEPHrine 0.3 mg/0.3 mL IJ SOAJ injection, Inject 0.3 mLs (0.3 mg total) into the muscle once., Disp: 1 Device, Rfl: 11 .  ethambutol (MYAMBUTOL) 400 MG tablet, TAKE TWO AND ONE-HALF TABLETS BY MOUTH ONCE DAILY, Disp: 225 tablet, Rfl: 5 .  ferrous sulfate (FERRO-BOB) 325 (65 FE) MG tablet, Take 325 mg by mouth daily.  , Disp: , Rfl:  .  fluticasone furoate-vilanterol (BREO ELLIPTA) 100-25 MCG/INH AEPB, Inhale 1 puff into the lungs daily., Disp: 120 each, Rfl: 0 .  levothyroxine (SYNTHROID, LEVOTHROID) 50 MCG tablet, Take 1 tablet by mouth daily., Disp: , Rfl: 0 .  predniSONE (DELTASONE) 5 MG tablet, Take 5 mg by mouth daily with breakfast., Disp: , Rfl:  .  SPIRIVA HANDIHALER 18 MCG inhalation capsule, PLACE 1 CAPSULE INTO INHALER AND INHALE DAILY, Disp: 30 capsule, Rfl: 1 .  voriconazole (VFEND) 200 MG tablet, Take 1 tablet (200 mg total) by mouth 2 (two) times daily., Disp: 180 tablet, Rfl: 3  Current Facility-Administered Medications:  .  omalizumab (XOLAIR) injection 375 mg, 375 mg, Subcutaneous, Q14 Days, Kimiah Hibner, Belva Crome, MD, 375 mg at 05/20/17 0909 .  omalizumab (XOLAIR) injection 375 mg, 375 mg, Subcutaneous, Q14 Days, Daphnie Venturini, MD, 375 mg at 06/03/17 1350 .  omalizumab Arvid Right) injection 375 mg, 375 mg, Subcutaneous, Q14 Days, Jaidee Stipe, MD, 375 mg at 06/17/17 1604   Review of Systems     Objective:   Physical Exam  Constitutional: He is oriented to person, place, and time. He appears well-developed. No distress.   thin  HENT:  Head: Normocephalic and atraumatic.  Right Ear: External ear normal.  Left Ear: External ear normal.  Mouth/Throat: Oropharynx is clear and moist. No oropharyngeal exudate.  Eyes: Pupils are equal, round, and reactive to light. Conjunctivae and EOM are normal. Right eye exhibits no discharge. Left eye exhibits no discharge. No scleral icterus.  Neck: Normal range of motion. Neck supple. No JVD present. No tracheal deviation present. No thyromegaly present.  Cardiovascular: Normal rate, regular rhythm and intact distal pulses.  Exam reveals no gallop and no  friction rub.   No murmur heard. Pulmonary/Chest: Effort normal and breath sounds normal. No respiratory distress. He has no wheezes. He has no rales. He exhibits no tenderness.  Left toracic scar Scattered crackles  Abdominal: Soft. Bowel sounds are normal. He exhibits no distension and no mass. There is no tenderness. There is no rebound and no guarding.  Musculoskeletal: Normal range of motion. He exhibits no edema or tenderness.  tatoo  Lymphadenopathy:    He has no cervical adenopathy.  Neurological: He is alert and oriented to person, place, and time. He has normal reflexes. No cranial nerve deficit. Coordination normal.  Skin: Skin is warm and dry. No rash noted. He is not diaphoretic. No erythema. No pallor.  Psychiatric: He has a normal mood and affect. His behavior is normal. Judgment and thought content normal.  Nursing note and vitals reviewed.  Vitals:   06/25/17 0931  BP: 116/72  Pulse: 67  SpO2: 95%  Weight: 130 lb (59 kg)  Height: 5' 9" (1.753 m)   Estimated body mass index is 19.2 kg/m as calculated from the following:   Height as of this encounter: 5' 9" (1.753 m).   Weight as of this encounter: 130 lb (59 kg).       Assessment:       ICD-10-CM   1. ABPA (allergic bronchopulmonary aspergillosis) ([place]) B44.81   2. 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[place]) B44.9        Plan:       Glad you are feeling well  and stable PFT decline might not be accurate Glad you met [name] and understand rejection of you as transplant andidate Flu shot in fall Continue your current medications  Followup  - in 6 months do Pre-bd spiro and post bd and dlco only. No lung volume  - return to see Dr Chase Caller in 6 months or sooner if needed     Dr. Brand Males, M.D., [facility].C.P Pulmonary and Critical Care Medicine Staff Physician [facility] Pager: [phone], If no answer or between  15:00h - 7:00h: call [phone]  06/25/2017 9:59 AM

## 2017-06-25 NOTE — Progress Notes (Signed)
PFT done today.

## 2017-06-25 NOTE — Patient Instructions (Signed)
ICD-10-CM   1. 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[place]) B44.9    Glad you are feeling well and stable PFT decline might not be accurate Glad you met [name] and understand rejection of you as transplant andidate Flu shot in fall Continue your current medications  Followup  - in 6 months do Pre-bd spiro and post bd and dlco only. No lung volume  - return to see Dr Chase Caller in 6 months or sooner if needed

## 2017-07-01 ENCOUNTER — Ambulatory Visit (INDEPENDENT_AMBULATORY_CARE_PROVIDER_SITE_OTHER): Payer: BLUE CROSS/BLUE SHIELD

## 2017-07-01 DIAGNOSIS — J4550 Severe persistent asthma, uncomplicated: Secondary | ICD-10-CM | POA: Diagnosis not present

## 2017-07-02 MED ORDER — OMALIZUMAB 150 MG ~~LOC~~ SOLR
375.0000 mg | Freq: Once | SUBCUTANEOUS | Status: AC
  Administered 2017-07-01: 375 mg via SUBCUTANEOUS

## 2017-07-03 ENCOUNTER — Telehealth: Payer: Self-pay | Admitting: Internal Medicine

## 2017-07-03 NOTE — Telephone Encounter (Signed)
#  vials:6 Ordered date:07/03/17 Shipping Date:07/09/17

## 2017-07-09 ENCOUNTER — Telehealth: Payer: Self-pay | Admitting: Internal Medicine

## 2017-07-09 NOTE — Telephone Encounter (Signed)
#  vials:6 Ordered date:07/09/17 Shipping Date:07/11/17 Returned [facility] call, the date I faxed in was too early. These are the new dates.

## 2017-07-09 NOTE — Telephone Encounter (Signed)
Routing to [employer] as she orders [employer] for the office.

## 2017-07-09 NOTE — Telephone Encounter (Signed)
Called [facility] back to reschedule shipment, since the original order was too soon. Xolair will be here 07/12/17. New order recorded in [employer] text. Nothing further needed.

## 2017-07-11 ENCOUNTER — Telehealth: Payer: Self-pay | Admitting: Internal Medicine

## 2017-07-11 ENCOUNTER — Other Ambulatory Visit: Payer: Self-pay | Admitting: Internal Medicine

## 2017-07-12 ENCOUNTER — Telehealth: Payer: Self-pay | Admitting: Internal Medicine

## 2017-07-12 MED ORDER — ALBUTEROL SULFATE HFA 108 (90 BASE) MCG/ACT IN AERS: 2.0000 | INHALATION_SPRAY | Freq: Four times a day (QID) | RESPIRATORY_TRACT | 4 refills | Status: DC | PRN

## 2017-07-12 NOTE — Telephone Encounter (Signed)
The shipment was still to soon, pt. Had me call and cancel order. Nothing further needed.

## 2017-07-12 NOTE — Telephone Encounter (Signed)
I returned pt.'s call  and explained to him [facility] said [facility] couldn't [facility] his xolair until 07/16/17. (his insure would not pay to have xolair sent in time for his 07/15/17 appt.) Pt. Was upset and asked me to cancel his 07/15/17 appt., if I didn't it would mess up his next appt.. I called [facility] and cancelled the 07/16/17 shipment, I had the [facility] rep. schedule del. For 07/25/17 for his 07/29/17 appt. Nothing further needed.

## 2017-07-12 NOTE — Telephone Encounter (Signed)
Called and spoke with pt. Pt states he had spoken with Tammy yesterday and she was able to answer all of his questions. Pt also request Rx for Ventolin to be sent to [facility] on [address] has been sent to preferred pharmacy.

## 2017-07-15 ENCOUNTER — Ambulatory Visit: Payer: Self-pay

## 2017-07-15 NOTE — Telephone Encounter (Signed)
#  vials:6 Ordered date:07/12/17 Shipping Date:07/24/17 I was so busy Fri., I forgot to put the order in.

## 2017-07-23 DIAGNOSIS — B449 Aspergillosis, unspecified: Secondary | ICD-10-CM | POA: Diagnosis not present

## 2017-07-23 DIAGNOSIS — J4540 Moderate persistent asthma, uncomplicated: Secondary | ICD-10-CM | POA: Diagnosis not present

## 2017-07-23 DIAGNOSIS — A310 Pulmonary mycobacterial infection: Secondary | ICD-10-CM | POA: Diagnosis not present

## 2017-07-23 DIAGNOSIS — B4481 Allergic bronchopulmonary aspergillosis: Secondary | ICD-10-CM | POA: Diagnosis not present

## 2017-07-25 NOTE — Telephone Encounter (Signed)
#  Vials:6 Arrival Date:07/25/17 Lot #:[id number] OHFG:[id number]

## 2017-07-29 ENCOUNTER — Ambulatory Visit (INDEPENDENT_AMBULATORY_CARE_PROVIDER_SITE_OTHER): Payer: BLUE CROSS/BLUE SHIELD

## 2017-07-29 DIAGNOSIS — J4540 Moderate persistent asthma, uncomplicated: Secondary | ICD-10-CM | POA: Diagnosis not present

## 2017-07-30 MED ORDER — OMALIZUMAB 150 MG ~~LOC~~ SOLR
375.0000 mg | SUBCUTANEOUS | Status: DC
  Administered 2017-07-29: 375 mg via SUBCUTANEOUS

## 2017-07-30 NOTE — Progress Notes (Signed)
Xolair injection documentation and charges entered by Ashley Caulfield, RMA, based on injection sheet filled out by Tammy Scott per office protocol.   

## 2017-08-08 DIAGNOSIS — Z923 Personal history of irradiation: Secondary | ICD-10-CM | POA: Diagnosis not present

## 2017-08-08 DIAGNOSIS — Z79899 Other long term (current) drug therapy: Secondary | ICD-10-CM | POA: Diagnosis not present

## 2017-08-08 DIAGNOSIS — A310 Pulmonary mycobacterial infection: Secondary | ICD-10-CM | POA: Diagnosis not present

## 2017-08-08 DIAGNOSIS — B441 Other pulmonary aspergillosis: Secondary | ICD-10-CM | POA: Diagnosis not present

## 2017-08-08 DIAGNOSIS — J430 Unilateral pulmonary emphysema [MacLeod's syndrome]: Secondary | ICD-10-CM | POA: Diagnosis not present

## 2017-08-08 DIAGNOSIS — K219 Gastro-esophageal reflux disease without esophagitis: Secondary | ICD-10-CM | POA: Diagnosis not present

## 2017-08-08 DIAGNOSIS — R042 Hemoptysis: Secondary | ICD-10-CM | POA: Diagnosis not present

## 2017-08-08 DIAGNOSIS — J449 Chronic obstructive pulmonary disease, unspecified: Secondary | ICD-10-CM | POA: Diagnosis not present

## 2017-08-08 DIAGNOSIS — J4550 Severe persistent asthma, uncomplicated: Secondary | ICD-10-CM | POA: Diagnosis not present

## 2017-08-08 DIAGNOSIS — R0602 Shortness of breath: Secondary | ICD-10-CM | POA: Diagnosis not present

## 2017-08-08 DIAGNOSIS — E0500 Thyrotoxicosis with diffuse goiter without thyrotoxic crisis or storm: Secondary | ICD-10-CM | POA: Diagnosis not present

## 2017-08-08 DIAGNOSIS — R0902 Hypoxemia: Secondary | ICD-10-CM | POA: Diagnosis not present

## 2017-08-08 DIAGNOSIS — R05 Cough: Secondary | ICD-10-CM | POA: Diagnosis not present

## 2017-08-08 DIAGNOSIS — R0600 Dyspnea, unspecified: Secondary | ICD-10-CM | POA: Diagnosis not present

## 2017-08-08 DIAGNOSIS — D509 Iron deficiency anemia, unspecified: Secondary | ICD-10-CM | POA: Diagnosis not present

## 2017-08-08 DIAGNOSIS — K8020 Calculus of gallbladder without cholecystitis without obstruction: Secondary | ICD-10-CM | POA: Diagnosis not present

## 2017-08-08 DIAGNOSIS — Z87891 Personal history of nicotine dependence: Secondary | ICD-10-CM | POA: Diagnosis not present

## 2017-08-08 DIAGNOSIS — E039 Hypothyroidism, unspecified: Secondary | ICD-10-CM | POA: Diagnosis not present

## 2017-08-08 DIAGNOSIS — Z23 Encounter for immunization: Secondary | ICD-10-CM | POA: Diagnosis not present

## 2017-08-08 DIAGNOSIS — E890 Postprocedural hypothyroidism: Secondary | ICD-10-CM | POA: Diagnosis not present

## 2017-08-08 DIAGNOSIS — B4481 Allergic bronchopulmonary aspergillosis: Secondary | ICD-10-CM | POA: Diagnosis not present

## 2017-08-08 DIAGNOSIS — B440 Invasive pulmonary aspergillosis: Secondary | ICD-10-CM | POA: Diagnosis not present

## 2017-08-09 ENCOUNTER — Observation Stay (HOSPITAL_COMMUNITY): Payer: BLUE CROSS/BLUE SHIELD

## 2017-08-09 ENCOUNTER — Encounter (HOSPITAL_COMMUNITY): Payer: Self-pay | Admitting: *Deleted

## 2017-08-09 ENCOUNTER — Observation Stay (HOSPITAL_COMMUNITY)
Admission: EM | Admit: 2017-08-09 | Discharge: 2017-08-10 | Disposition: A | Discharge status: Home / Self Care | Payer: BLUE CROSS/BLUE SHIELD | Source: Other Acute Inpatient Hospital | Attending: Internal Medicine | Admitting: Internal Medicine

## 2017-08-09 ENCOUNTER — Other Ambulatory Visit: Payer: Self-pay | Admitting: Internal Medicine

## 2017-08-09 DIAGNOSIS — D509 Iron deficiency anemia, unspecified: Secondary | ICD-10-CM | POA: Diagnosis not present

## 2017-08-09 DIAGNOSIS — B4481 Allergic bronchopulmonary aspergillosis: Principal | ICD-10-CM | POA: Diagnosis present

## 2017-08-09 DIAGNOSIS — Z87891 Personal history of nicotine dependence: Secondary | ICD-10-CM | POA: Diagnosis not present

## 2017-08-09 DIAGNOSIS — J4550 Severe persistent asthma, uncomplicated: Secondary | ICD-10-CM | POA: Insufficient documentation

## 2017-08-09 DIAGNOSIS — J430 Unilateral pulmonary emphysema [MacLeod's syndrome]: Secondary | ICD-10-CM | POA: Insufficient documentation

## 2017-08-09 DIAGNOSIS — J4489 Other specified chronic obstructive pulmonary disease: Secondary | ICD-10-CM | POA: Diagnosis present

## 2017-08-09 DIAGNOSIS — J4540 Moderate persistent asthma, uncomplicated: Secondary | ICD-10-CM

## 2017-08-09 DIAGNOSIS — R042 Hemoptysis: Secondary | ICD-10-CM | POA: Diagnosis present

## 2017-08-09 DIAGNOSIS — J449 Chronic obstructive pulmonary disease, unspecified: Secondary | ICD-10-CM | POA: Insufficient documentation

## 2017-08-09 DIAGNOSIS — R0600 Dyspnea, unspecified: Secondary | ICD-10-CM | POA: Diagnosis not present

## 2017-08-09 DIAGNOSIS — K219 Gastro-esophageal reflux disease without esophagitis: Secondary | ICD-10-CM | POA: Diagnosis not present

## 2017-08-09 DIAGNOSIS — A310 Pulmonary mycobacterial infection: Secondary | ICD-10-CM | POA: Diagnosis not present

## 2017-08-09 DIAGNOSIS — K8020 Calculus of gallbladder without cholecystitis without obstruction: Secondary | ICD-10-CM | POA: Diagnosis not present

## 2017-08-09 DIAGNOSIS — E039 Hypothyroidism, unspecified: Secondary | ICD-10-CM | POA: Diagnosis present

## 2017-08-09 DIAGNOSIS — Z23 Encounter for immunization: Secondary | ICD-10-CM | POA: Insufficient documentation

## 2017-08-09 DIAGNOSIS — B449 Aspergillosis, unspecified: Secondary | ICD-10-CM | POA: Diagnosis present

## 2017-08-09 DIAGNOSIS — Z79899 Other long term (current) drug therapy: Secondary | ICD-10-CM | POA: Diagnosis not present

## 2017-08-09 LAB — COMPREHENSIVE METABOLIC PANEL
ALBUMIN: 3.3 g/dL — AB (ref 3.5–5.0)
ALT: 12 U/L — ABNORMAL LOW (ref 17–63)
AST: 18 U/L (ref 15–41)
Alkaline Phosphatase: 71 U/L (ref 38–126)
Anion gap: 7 (ref 5–15)
BILIRUBIN TOTAL: 0.8 mg/dL (ref 0.3–1.2)
BUN: 10 mg/dL (ref 6–20)
CHLORIDE: 104 mmol/L (ref 101–111)
CO2: 28 mmol/L (ref 22–32)
Calcium: 8.7 mg/dL — ABNORMAL LOW (ref 8.9–10.3)
Creatinine, Ser: 0.78 mg/dL (ref 0.61–1.24)
GFR calc Af Amer: >60 mL/min (ref >60)
GFR calc non Af Amer: >60 mL/min (ref >60)
GLUCOSE: 123 mg/dL — AB (ref 65–99)
POTASSIUM: 4.1 mmol/L (ref 3.5–5.1)
Sodium: 139 mmol/L (ref 135–145)
TOTAL PROTEIN: 6.4 g/dL — AB (ref 6.5–8.1)

## 2017-08-09 LAB — CBC WITH DIFFERENTIAL/PLATELET
BASOS PCT: 0 %
Basophils Absolute: 0 10*3/uL (ref 0.0–0.1)
Eosinophils Absolute: 0 10*3/uL (ref 0.0–0.7)
Eosinophils Relative: 0 %
HEMATOCRIT: 43.5 % (ref 39.0–52.0)
HEMOGLOBIN: 14.7 g/dL (ref 13.0–17.0)
LYMPHS ABS: 0.6 10*3/uL — AB (ref 0.7–4.0)
Lymphocytes Relative: 5 %
MCH: 30.4 pg (ref 26.0–34.0)
MCHC: 33.8 g/dL (ref 30.0–36.0)
MCV: 89.9 fL (ref 78.0–100.0)
MONOS PCT: 4 %
Monocytes Absolute: 0.5 10*3/uL (ref 0.1–1.0)
NEUTROS ABS: 12 10*3/uL — AB (ref 1.7–7.7)
NEUTROS PCT: 91 %
Platelets: 235 10*3/uL (ref 150–400)
RBC: 4.84 MIL/uL (ref 4.22–5.81)
RDW: 13.8 % (ref 11.5–15.5)
WBC: 13.2 10*3/uL — ABNORMAL HIGH (ref 4.0–10.5)

## 2017-08-09 LAB — APTT: aPTT: 40 seconds — ABNORMAL HIGH (ref 24–36)

## 2017-08-09 LAB — TYPE AND SCREEN
ABO/RH(D): B POS
ANTIBODY SCREEN: NEGATIVE

## 2017-08-09 LAB — MRSA PCR SCREENING: MRSA by PCR: NEGATIVE

## 2017-08-09 LAB — PROTIME-INR
INR: 1.04
Prothrombin Time: 13.5 seconds (ref 11.4–15.2)

## 2017-08-09 MED ORDER — ETHAMBUTOL HCL 400 MG PO TABS
1000.0000 mg | ORAL_TABLET | Freq: Every day | ORAL | Status: DC
  Administered 2017-08-09 – 2017-08-10 (×2): 1000 mg via ORAL
  Filled 2017-08-09 (×2): qty 2

## 2017-08-09 MED ORDER — ONDANSETRON HCL 4 MG/2ML IJ SOLN: 4.0000 mg | Freq: Three times a day (TID) | INTRAMUSCULAR | Status: DC | PRN

## 2017-08-09 MED ORDER — TIOTROPIUM BROMIDE MONOHYDRATE 18 MCG IN CAPS
1.0000 | ORAL_CAPSULE | Freq: Every day | RESPIRATORY_TRACT | Status: DC
  Administered 2017-08-10: 18 ug via RESPIRATORY_TRACT
  Filled 2017-08-09: qty 5

## 2017-08-09 MED ORDER — LEVOTHYROXINE SODIUM 75 MCG PO TABS
75.0000 ug | ORAL_TABLET | Freq: Every day | ORAL | Status: DC
  Administered 2017-08-09 – 2017-08-10 (×2): 75 ug via ORAL
  Filled 2017-08-09 (×2): qty 1

## 2017-08-09 MED ORDER — AMOXICILLIN-POT CLAVULANATE 875-125 MG PO TABS
1.0000 | ORAL_TABLET | Freq: Two times a day (BID) | ORAL | Status: DC
  Administered 2017-08-09 – 2017-08-10 (×2): 1 via ORAL
  Filled 2017-08-09 (×4): qty 1

## 2017-08-09 MED ORDER — ALBUTEROL SULFATE (2.5 MG/3ML) 0.083% IN NEBU: 2.5000 mg | INHALATION_SOLUTION | RESPIRATORY_TRACT | Status: DC | PRN

## 2017-08-09 MED ORDER — INFLUENZA VAC SPLIT QUAD 0.5 ML IM SUSY
0.5000 mL | PREFILLED_SYRINGE | INTRAMUSCULAR | Status: AC
  Administered 2017-08-10: 0.5 mL via INTRAMUSCULAR

## 2017-08-09 MED ORDER — ACETAMINOPHEN 325 MG PO TABS: 650.0000 mg | ORAL_TABLET | Freq: Four times a day (QID) | ORAL | Status: DC | PRN

## 2017-08-09 MED ORDER — IOPAMIDOL (ISOVUE-300) INJECTION 61%
INTRAVENOUS | Status: AC
  Administered 2017-08-09: 75 mL
  Filled 2017-08-09: qty 75

## 2017-08-09 MED ORDER — VORICONAZOLE 200 MG PO TABS
200.0000 mg | ORAL_TABLET | Freq: Two times a day (BID) | ORAL | Status: DC
  Administered 2017-08-09 – 2017-08-10 (×3): 200 mg via ORAL
  Filled 2017-08-09 (×4): qty 1

## 2017-08-09 MED ORDER — AZITHROMYCIN 500 MG PO TABS
500.0000 mg | ORAL_TABLET | Freq: Every day | ORAL | Status: DC
  Administered 2017-08-09 – 2017-08-10 (×2): 500 mg via ORAL
  Filled 2017-08-09 (×2): qty 1

## 2017-08-09 MED ORDER — SODIUM CHLORIDE 0.9 % IV SOLN
INTRAVENOUS | Status: DC
  Administered 2017-08-09: 1000 mL via INTRAVENOUS
  Administered 2017-08-09: 07:00:00 via INTRAVENOUS

## 2017-08-09 MED ORDER — DM-GUAIFENESIN ER 30-600 MG PO TB12: 1.0000 | ORAL_TABLET | Freq: Two times a day (BID) | ORAL | Status: DC | PRN

## 2017-08-09 MED ORDER — PREDNISONE 10 MG PO TABS
5.0000 mg | ORAL_TABLET | Freq: Every day | ORAL | Status: DC
  Administered 2017-08-10: 5 mg via ORAL
  Filled 2017-08-09: qty 1

## 2017-08-09 NOTE — Consult Note (Signed)
Name: [name] MRN: [id number] DOB: Apr 18, 1976    ADMISSION DATE:  08/09/2017 CONSULTATION DATE:  9/21  REFERRING MD :  Dr. Jamse Arn TRH  CHIEF COMPLAINT:  Hemoptysis  HISTORY OF PRESENT ILLNESS:  41 year old [place] male with history of pulmonary aspergilloma (ABPA) and MAI with history of hemoptysis requiring LUL apical posterior embolectomy ('12) and embolization ('16), now admitted 9/21 with hemoptysis. Recently rejected at [facility] for lung transplant as he was too early to consider this. He is managed with voriconazole, prednisone, spiriva, Breo, and ethambutol.   He says he has been feeling well lately with no fevers or chills and only his mild nonproductive chronic cough. He works as a Chiropractor, and when he came home after his shift he laid down in bed then began coughing up blood. He tells me that he coughed up approx 10-20cc of blood each time for a total of 4 times over a 5 hour time period.  However, he told other providers that the total amount was 10oz of blood. He says it all stopped on 9/20 at 5am. He denies SOB, CP, Weight loss, Night sweats, or Wheezing. He says he is compliant with his Voriconazole, Ethambutol, and Azithromycin that he takes as an outpatient.   He requested transfer to [facility], and was transferred. Admitted to hospitalst who are obtaining CT scan, PCCM to evaluate. Patient followed by [name] in the outpatient setting.   PAST MEDICAL HISTORY :   has a past medical history of 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[place]); Esophageal reflux; Hypothyroidism; Lung disease, bullous ([place]); MAI (mycobacterium avium-intracellulare) ([place]); Mold exposure (12/05/2015); Solar lentigo (08/08/2015); and Weight loss (10/15/2016).  has a past surgical history that includes left VATS (2012) and Video bronchoscopy (Bilateral, 10/20/2015). Prior to Admission medications   Medication Sig Start Date End Date Taking? Authorizing Provider  albuterol (PROVENTIL HFA;VENTOLIN HFA) 108 (90 Base)  MCG/ACT inhaler Inhale 2 puffs into the lungs every 6 (six) hours as needed. Patient taking differently: Inhale 2 puffs into the lungs every 6 (six) hours as needed for wheezing or shortness of breath.  07/12/17  Yes Brand Males, MD  azithromycin (ZITHROMAX) 500 MG tablet Take 500 mg by mouth daily. 07/16/17  Yes [provider]  dextromethorphan-guaiFENesin (TUSSIN DM) 10-100 MG/5ML liquid Take 10 mLs by mouth every 4 (four) hours as needed for cough. 10/20/15  Yes Thurnell Lose, MD  EPINEPHrine 0.3 mg/0.3 mL IJ SOAJ injection Inject 0.3 mLs (0.3 mg total) into the muscle once. 04/20/16  Yes Mannam, Praveen, MD  ethambutol (MYAMBUTOL) 400 MG tablet TAKE TWO AND ONE-HALF TABLETS BY MOUTH ONCE DAILY Patient taking differently: TAKE 1000 MG TABLETS BY MOUTH ONCE DAILY 06/06/17  Yes Tommy Medal, Lavell Islam, MD  ferrous sulfate (FERRO-BOB) 325 (65 FE) MG tablet Take 325 mg by mouth daily.     Yes [provider]  fluticasone furoate-vilanterol (BREO ELLIPTA) 100-25 MCG/INH AEPB Inhale 1 puff into the lungs daily. 08/27/16  Yes Brand Males, MD  levothyroxine (SYNTHROID, LEVOTHROID) 75 MCG tablet Take 75 mcg by mouth daily.  09/27/14  Yes [provider]  omalizumab Arvid Right) 150 MG injection Inject 375 mg into the skin every 14 (fourteen) days. 07/30/17  Yes [provider]  predniSONE (DELTASONE) 5 MG tablet Take 5 mg by mouth daily with breakfast.   Yes [provider]  SPIRIVA HANDIHALER 18 MCG inhalation capsule PLACE 1 CAPSULE INTO INHALER AND INHALE DAILY 02/12/17  Yes Brand Males, MD  voriconazole (VFEND) 200 MG  tablet Take 1 tablet (200 mg total) by mouth 2 (two) times daily. 04/12/17  Yes Tommy Medal, Lavell Islam, MD  alendronate (FOSAMAX) 70 MG tablet Take 1 tablet (70 mg total) by mouth once a week. Take with a full glass of water on an empty stomach. Patient not taking: Reported on 08/09/2017 07/05/14   Tommy Medal, Lavell Islam, MD   Allergies    Allergen Reactions  . Clarithromycin Other (See Comments)    Adverse reaction w/ voriconazole  . Rifaximin Other (See Comments)    Adverse reaction w/ voriconazole    FAMILY HISTORY:  family history is not on file. He was adopted. SOCIAL HISTORY:  reports that he quit smoking about 9 years ago. His smoking use included Cigarettes. He has a 13.30 pack-year smoking history. He quit smokeless tobacco use about 22 years ago. His smokeless tobacco use included Snuff. He reports that he does not drink alcohol or use drugs.  REVIEW OF SYSTEMS:   Bolds are positive  Constitutional: weight loss, gain, night sweats, Fevers, chills, fatigue .  HEENT: headaches, Sore throat, sneezing, nasal congestion, post nasal drip, Difficulty swallowing, Tooth/dental problems, visual complaints visual changes, ear ache CV:  chest pain, radiates:,Orthopnea, PND, swelling in lower extremities, dizziness, palpitations, syncope.  GI  heartburn, indigestion, abdominal pain, nausea, vomiting, diarrhea, change in bowel habits, loss of appetite, bloody stools.  Resp: cough, hemoptysis (stopped now), dyspnea due to coughing only, now better. chest pain, pleuritic.  Skin: rash or itching or icterus GU: dysuria, change in color of urine, urgency or frequency. flank pain, hematuria  MS: joint pain or swelling. decreased range of motion  Psych: change in mood or affect. depression or anxiety.  Neuro: difficulty with speech, weakness, numbness, ataxia   SUBJECTIVE:   VITAL SIGNS: Temp:  [97.6 F (36.4 C)] 97.6 F (36.4 C) (09/21 0547) Pulse Rate:  [62] 62 (09/21 0547) Resp:  [19] 19 (09/21 0547) BP: (106)/(86) 106/86 (09/21 0547) SpO2:  [100 %] 100 % (09/21 0547) Weight:  [55.5 kg (122 lb 6.4 oz)] 55.5 kg (122 lb 6.4 oz) (09/21 0547)  PHYSICAL EXAMINATION: General:  Adult [place] male, Thin, Tattoos; NAD.  Neuro:  Alert, oriented, non-focal HEENT:  [place]/AT, PERRL, no JVD Cardiovascular:  RRR, no MRG Lungs:  CTA  b/l, speaking in full sentences, no accessory muscle use, no active hemoptysis Abdomen:  Soft, non-tender, non-distended Musculoskeletal:  No acute deformity or ROM limitatoin Skin:  Grossly intact.   LABS: BMET  Recent Labs Lab 08/09/17 1048  NA 139  K 4.1  CL 104  CO2 28  BUN 10  CREATININE 0.78  GLUCOSE 123*   Electrolytes  Recent Labs Lab 08/09/17 1048  CALCIUM 8.7*   CBC  Recent Labs Lab 08/09/17 1048  WBC 13.2*  HGB 14.7  HCT 43.5  PLT 235   Coag's  Recent Labs Lab 08/09/17 1048  APTT 40*  INR 1.04   Sepsis Markers No results for input(s): LATICACIDVEN, PROCALCITON, O2SATVEN in the last 168 hours.  ABG No results for input(s): PHART, PCO2ART, PO2ART in the last 168 hours.  Liver Enzymes  Recent Labs Lab 08/09/17 1048  AST 18  ALT 12*  ALKPHOS 71  BILITOT 0.8  ALBUMIN 3.3*   Cardiac Enzymes No results for input(s): TROPONINI, PROBNP in the last 168 hours.  Glucose No results for input(s): GLUCAP in the last 168 hours.  Imaging Ct Chest W Contrast  Result Date: 08/09/2017 CLINICAL DATA:  Dyspnea hemoptysis. EXAM: CT CHEST WITH  CONTRAST TECHNIQUE: Multidetector CT imaging of the chest was performed during intravenous contrast administration. CONTRAST:  57m ISOVUE-300 IOPAMIDOL (ISOVUE-300) INJECTION 61% COMPARISON:  CT scan of October 23, 2016. FINDINGS: Cardiovascular: No significant vascular findings. Normal heart size. No pericardial effusion. Mediastinum/Nodes: No enlarged mediastinal, hilar, or axillary lymph nodes. Thyroid gland, trachea, and esophagus demonstrate no significant findings. Lungs/Pleura: Large emphysematous bulla are again noted in both upper lobes. However, there is interval development of fluid density in several bulla in the left upper lobe concerning for superimposed infection. No pneumothorax or pleural effusion is noted. Stable tree-in-bud appearance is noted medially in the left lung posteriorly. Stable bilateral  calcified pulmonary nodules are noted consistent with prior granulomatous disease. Upper Abdomen: Cholelithiasis is noted. Musculoskeletal: No chest wall abnormality. No acute or significant osseous findings. IMPRESSION: Large emphysematous bulla are noted in both upper lobes. There is interval development of fluid density in several bulla in the left upper lobe concerning for superimposed infection, possibly fungal in origin. Stable bilateral calcified pulmonary nodules are noted consistent with prior granulomatous disease. Stable tree in bud appearance is noted medially in the left lung posteriorly consistent with sequela of previous atypical infection. Cholelithiasis. Electronically Signed   By: JMarijo Conception M.D.   On: 08/09/2017 15:48   SIGNIFICANT EVENTS  [facility] STUDIES:  CT chest w/ contrast (9/21): Large emphysematous bulla are noted in both upper lobes. There is interval development of fluid density in several bulla in the left upper lobe concerning for superimposed infection, possibly fungal in origin. Stable bilateral calcified pulmonary nodules are noted consistent with prior granulomatous disease. Stable tree in bud appearance is noted medially in the left lung posteriorly consistent with sequela of previous atypical infection.   ASSESSMENT / PLAN: 41year old [place] with history of pulmonary aspergilloma (ABPA) and MAI with history of hemoptysis requiring LUL apical posterior embolectomy ('12) and embolization ('16), now admitted 9/21 with hemoptysis. Total amount is not clear, as different reports of totals anywhere from 2oz to10oz total.  Hemoptysis has now resolved. Chest CT on my review shows some fluid-filled blebs with air-fluid levels within the LUL, likely consistent with superimposed bacterial infection. Recommend obtaining bacterial sputum culture and starting Augmentin for total of 21 days. Recommend repeat Chest CT with contrast  and follow-up in Pulmonary clinic in 3 weeks. If the area in question has not improved by that time, would consider bronchoscopy with fungal and afb cultures. Given the degree of his hemoptysis, would recommend keeping in an inpatient for at least 1 more day.   Time spent on consult: 45 minutes  KVernie Murders MD  Pulmonary and CHartfordPager: [phone] 08/09/2017, 4:38 PM

## 2017-08-09 NOTE — H&P (Signed)
History and Physical:    [name]   ZHG:[id number] DOB: [birth year] DOA: 08/09/2017  Referring MD/provider: Transfer from [facility] PCP: Wallene Dales, MD   Patient coming from: Home  Chief Complaint: Hemoptysis  History of Present Illness:   [name] is an 41 y.o. male with past medical history significant for aspergillosis along with MAI diagnosed in 2010 with known history of hemoptysis requiring resection as well as embolization last 2016 was in his usual state of health until 2 days prior to admission when he noted onset of significance amounts of frank blood with his cough. Patient thinks he had approximately 10 ounces of hemoptysis in total. Patient notes that he usually has specks of blood with his phlegm in the morning at baseline. 2 days ago however he noted only blood with no phlegm associated with his cough. He presented to [facility] where chest x-ray revealed new onset of circumscribed lesion in left upper lobe with infiltration. He was admitted to stepdown CBC was followed and was stable and was transferred here upon his request. He has not had any further hemoptysis for the past 27 hours.  Patient denies any fevers or chills. He states that he last took his aspergilloma medications sometime last week. He notes no change in baseline functioning until 2 days ago when he started having hemoptysis.   At present patient feels at baseline. He notes that he is dyspneic when he is coughing but does not have any dyspnea at baseline. 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 Patient notes he knows how much she can do and manages his baseline dyspnea with behavior modification.  From previous note:  2010. . Diagnosed with MAI due to significant cough - started on azithromycin and ethambutol   2012: diagnosed with aspergilloma causing hemoptysis and had a left upper lobe posterior segmentectomy - started on voriconazole  later diagnosed  with ABPA as well given very high IgE levels (> 4000 on 03/07/2011). There was no speciation done on the Aspergillus species. - started on prednisone   Tried off azithromycin/ethambutol in 2013: but developed fever/cough - cultures for AFB were negative but patient had symptomatic improvement on the meds so restarted   2016: hospitalized with hemoptysis: underwent IR guided embolization and found to have an area in the upper lobe consistent with an aspergilloma. He underwent bronchoscopy with BAL and cultures ultimately yielded a Saccharomyces species on fungal culture. His serum galactomannan was negative.  He has been on voriconazole and has had radiographic improvement and his x-ray findings. He also feelt etter symptomatically with no more hemoptysis improvement in his cough improvement in his energy though he still feels not nearly as well as he did prior to his hospitalization.  Has been evaluated at [facility] for possible lung transplant but rejected 3 months ago   ROS:   ROS 10 point review of systems otherwise negative unless as mentioned in history of present illness.  Past Medical History:   Past Medical History:  Diagnosis Date  . 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[place])   . Esophageal reflux   . 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 Lung disease, bullous ([place])   . MAI (mycobacterium avium-intracellulare) ([place])   . Mold exposure 12/05/2015  . Solar lentigo 08/08/2015  . Weight loss 10/15/2016    Past Surgical History:   Past Surgical History:  Procedure Laterality Date  . left VATS  2012   thoracotomy and LUL  apical posterior segmentectomy  . VIDEO BRONCHOSCOPY Bilateral 10/20/2015   Procedure: VIDEO BRONCHOSCOPY WITHOUT FLUORO;  Surgeon: Marshell Garfinkel, MD;  Location: [facility];  Service: Cardiopulmonary;  Laterality: Bilateral;    Social History:   Social History   Social History  . Marital status: Single    Spouse name: N/A  . Number of children: N/A  .  Years of education: N/A   Occupational History  . works 2 jobs    Social History Main Topics  . Smoking status: Former Smoker    Packs/day: 0.70    Years: 19.00    Types: Cigarettes    Quit date: 11/20/2007  . Smokeless tobacco: Former Systems developer    Types: Snuff    Quit date: 11/19/1994  . Alcohol use No  . Drug use: No  . Sexual activity: Not on file   Other Topics Concern  . Not on file   Social History Narrative   Pt is adopted    Allergies   Clarithromycin and Rifaximin  Family history:   Family History  Problem Relation Age of Onset  . Adopted: Yes    Current Medications:   Prior to Admission medications   Medication Sig Start Date End Date Taking? Authorizing Provider  albuterol (PROVENTIL HFA;VENTOLIN HFA) 108 (90 Base) MCG/ACT inhaler Inhale 2 puffs into the lungs every 6 (six) hours as needed. Patient taking differently: Inhale 2 puffs into the lungs every 6 (six) hours as needed for wheezing or shortness of breath.  07/12/17  Yes Brand Males, MD  azithromycin (ZITHROMAX) 500 MG tablet Take 500 mg by mouth daily. 07/16/17  Yes [provider]  dextromethorphan-guaiFENesin (TUSSIN DM) 10-100 MG/5ML liquid Take 10 mLs by mouth every 4 (four) hours as needed for cough. 10/20/15  Yes Thurnell Lose, MD  EPINEPHrine 0.3 mg/0.3 mL IJ SOAJ injection Inject 0.3 mLs (0.3 mg total) into the muscle once. 04/20/16  Yes Mannam, Praveen, MD  ethambutol (MYAMBUTOL) 400 MG tablet TAKE TWO AND ONE-HALF TABLETS BY MOUTH ONCE DAILY Patient taking differently: TAKE 1000 MG TABLETS BY MOUTH ONCE DAILY 06/06/17  Yes Tommy Medal, Lavell Islam, MD  ferrous sulfate (FERRO-BOB) 325 (65 FE) MG tablet Take 325 mg by mouth daily.     Yes [provider]  fluticasone furoate-vilanterol (BREO ELLIPTA) 100-25 MCG/INH AEPB Inhale 1 puff into the lungs daily. 08/27/16  Yes Brand Males, MD  levothyroxine (SYNTHROID, LEVOTHROID) 50 MCG tablet Take 50 mcg by mouth daily.  09/27/14  Yes  [provider]  omalizumab Arvid Right) 150 MG injection Inject 375 mg into the skin every 14 (fourteen) days. 07/30/17  Yes [provider]  predniSONE (DELTASONE) 5 MG tablet Take 5 mg by mouth daily with breakfast.   Yes [provider]  SPIRIVA HANDIHALER 18 MCG inhalation capsule PLACE 1 CAPSULE INTO INHALER AND INHALE DAILY 02/12/17  Yes Brand Males, MD  voriconazole (VFEND) 200 MG tablet Take 1 tablet (200 mg total) by mouth 2 (two) times daily. 04/12/17  Yes Tommy Medal, Lavell Islam, MD  alendronate (FOSAMAX) 70 MG tablet Take 1 tablet (70 mg total) by mouth once a week. Take with a full glass of water on an empty stomach. Patient not taking: Reported on 08/09/2017 07/05/14   Tommy Medal, Lavell Islam, MD    Physical Exam:   Vitals:   08/09/17 0547  BP: 106/86  Pulse: 62  Resp: 19  Temp: 97.6 F (36.4 C)  TempSrc: Oral  SpO2: 100%  Weight: 55.5 kg (122  lb 6.4 oz)  Height: _0  (1.753 m)     Physical Exam: Blood pressure 106/86, pulse 62, temperature 97.6 F (36.4 C), temperature source Oral, resp. rate 19, height _1  (1.753 m), weight 55.5 kg (122 lb 6.4 oz), SpO2 100 %. Gen: Thin gentleman  with some bitemporal wasting and with multiple tattoos lying in bed in no apparent distress. Eyes: Sclerae anicteric. Conjunctiva mildly injected. Neck: Supple, no jugular venous distention. Chest: Moderately good air entry bilaterally. He may have some bronchial breath sounds midlung fields bilaterally. No rales or rhonchi noted. No wheezing. CV: Distant, regular, no audible murmurs. Abdomen: NABS, soft, nondistended, nontender. No tenderness to light or deep palpation. No rebound, no guarding. Extremities: No edema.  Skin: Warm and dry. No rashes, lesions or wounds. Extensive tattooing is noted. Neuro: Alert and oriented times 3; grossly nonfocal. Psych: Patient is cooperative, logical and coherent with appropriate mood and affect.  Data Review:     Labs:  Ordered and pending  Urinalysis    Component Value Date/Time   COLORURINE YELLOW 05/28/2011 [place] 05/28/2011 0943   LABSPEC 1.021 05/28/2011 0943   PHURINE 6.0 05/28/2011 0943   GLUCOSEU NEG 05/28/2011 0943   HGBUR NEG 05/28/2011 0943   BILIRUBINUR NEG 05/28/2011 0943   KETONESUR NEG 05/28/2011 0943   PROTEINUR NEG 05/28/2011 0943   UROBILINOGEN 0.2 05/28/2011 0943   NITRITE NEG 05/28/2011 0943   LEUKOCYTESUR NEG 05/28/2011 0943      Radiographic Studies: No results found.  Chest CT with contrast is ordered.  Assessment/Plan:   Principal Problem:   Hemoptysis Active Problems:   ABPA (allergic bronchopulmonary aspergillosis) (HCC)   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HCC)   Mycobacterium avium infection ([place])   Lung disease, chronic obstructive (HCC)   Mycobacterium avium-intracellulare complex (HCC)   Hypothyroidism   Iron deficiency anemia   HEMOPTYSIS Hemoptysis appears to have resolved spontaneously with last episode 27 hours ago. Patient is hemodynamically stable, CBC has been stable over the past 24 hours. We'll continue to monitor and stepdown pending pulmonary evaluation. Discussed with Dr. Phylliss Bob of pulmonary who will come by and see the patient today. No Lovenox or aspirin or any antiplatelet medications. SCD for DVT E prophylaxis  NEW LESION/INFILTRATION ON CXR  Patient has a new circumscribed lesion on left upper lobe of chest x-ray from x-ray done at [facility] last night. CT has been ordered and is pending. Given no elevation of white count or fever or malaise, will hold off on antibiotics for now as this is most likely another aspergilloma.  ASPERGILOSIS AND MAI  He is already on voriconazole, prednisone, ethambutol and azithromycin (for MAI), we will continue  COPD Continue Spiriva and when necessary albuterol. Hold off on inhaled steroids for now until we have a better understanding of what is going on in his  lungs.  HYPOTHYROIDISM Continue Synthroid     Other information:   DVT prophylaxis: Lovenox ordered. Code Status: Full code. Family Communication: Patient states his family is aware that he is here Disposition Plan: Home Consults called: Pulmonary Dr. Phylliss Bob Admission status: Observation   The medical decision making on this patient was of high complexity and the patient is at high risk for clinical deterioration, therefore this is a level 3 visit.  Dewaine Oats Derek Jack [facility] Pager [phone] Cell: [phone]   If 7PM-7AM, please contact night-coverage [email or web address] Password [facility] 08/09/2017, 9:49 AM

## 2017-08-10 DIAGNOSIS — B4481 Allergic bronchopulmonary aspergillosis: Secondary | ICD-10-CM | POA: Diagnosis not present

## 2017-08-10 DIAGNOSIS — R042 Hemoptysis: Secondary | ICD-10-CM | POA: Diagnosis not present

## 2017-08-10 DIAGNOSIS — Z87891 Personal history of nicotine dependence: Secondary | ICD-10-CM | POA: Diagnosis not present

## 2017-08-10 DIAGNOSIS — K219 Gastro-esophageal reflux disease without esophagitis: Secondary | ICD-10-CM | POA: Diagnosis not present

## 2017-08-10 DIAGNOSIS — Z79899 Other long term (current) drug therapy: Secondary | ICD-10-CM | POA: Diagnosis not present

## 2017-08-10 DIAGNOSIS — J430 Unilateral pulmonary emphysema [MacLeod's syndrome]: Secondary | ICD-10-CM | POA: Diagnosis not present

## 2017-08-10 DIAGNOSIS — Z23 Encounter for immunization: Secondary | ICD-10-CM | POA: Diagnosis not present

## 2017-08-10 DIAGNOSIS — A310 Pulmonary mycobacterial infection: Secondary | ICD-10-CM | POA: Diagnosis not present

## 2017-08-10 DIAGNOSIS — J449 Chronic obstructive pulmonary disease, unspecified: Secondary | ICD-10-CM | POA: Diagnosis not present

## 2017-08-10 DIAGNOSIS — K8020 Calculus of gallbladder without cholecystitis without obstruction: Secondary | ICD-10-CM | POA: Diagnosis not present

## 2017-08-10 DIAGNOSIS — J4550 Severe persistent asthma, uncomplicated: Secondary | ICD-10-CM | POA: Diagnosis not present

## 2017-08-10 DIAGNOSIS — E039 Hypothyroidism, unspecified: Secondary | ICD-10-CM

## 2017-08-10 DIAGNOSIS — D509 Iron deficiency anemia, unspecified: Secondary | ICD-10-CM | POA: Diagnosis not present

## 2017-08-10 LAB — BASIC METABOLIC PANEL
ANION GAP: 4 — AB (ref 5–15)
BUN: 9 mg/dL (ref 6–20)
CALCIUM: 8.2 mg/dL — AB (ref 8.9–10.3)
CO2: 28 mmol/L (ref 22–32)
Chloride: 108 mmol/L (ref 101–111)
Creatinine, Ser: 0.87 mg/dL (ref 0.61–1.24)
Glucose, Bld: 71 mg/dL (ref 65–99)
POTASSIUM: 3.7 mmol/L (ref 3.5–5.1)
SODIUM: 140 mmol/L (ref 135–145)

## 2017-08-10 LAB — CBC WITH DIFFERENTIAL/PLATELET
BASOS ABS: 0 10*3/uL (ref 0.0–0.1)
BASOS PCT: 0 %
Eosinophils Absolute: 0 10*3/uL (ref 0.0–0.7)
Eosinophils Relative: 0 %
HEMATOCRIT: 41.5 % (ref 39.0–52.0)
Hemoglobin: 13.8 g/dL (ref 13.0–17.0)
LYMPHS PCT: 8 %
Lymphs Abs: 0.8 10*3/uL (ref 0.7–4.0)
MCH: 30.4 pg (ref 26.0–34.0)
MCHC: 33.3 g/dL (ref 30.0–36.0)
MCV: 91.4 fL (ref 78.0–100.0)
MONO ABS: 0.6 10*3/uL (ref 0.1–1.0)
Monocytes Relative: 6 %
NEUTROS ABS: 8.7 10*3/uL — AB (ref 1.7–7.7)
NEUTROS PCT: 86 %
Platelets: 188 10*3/uL (ref 150–400)
RBC: 4.54 MIL/uL (ref 4.22–5.81)
RDW: 14 % (ref 11.5–15.5)
WBC: 10.1 10*3/uL (ref 4.0–10.5)

## 2017-08-10 LAB — HIV ANTIBODY (ROUTINE TESTING W REFLEX): HIV SCREEN 4TH GENERATION: NONREACTIVE

## 2017-08-10 MED ORDER — AMOXICILLIN-POT CLAVULANATE 875-125 MG PO TABS: 1.0000 | ORAL_TABLET | Freq: Two times a day (BID) | ORAL | 0 refills | Status: AC

## 2017-08-10 NOTE — Discharge Summary (Signed)
Physician Discharge Summary  [name] MWN:[id number] DOB: 06-23-1976 DOA: 08/09/2017  PCP: Wallene Dales, MD  Admit date: 08/09/2017 Discharge date: 08/10/2017  Time spent: 60 minutes  Recommendations for Outpatient Follow-up:  1. Follow-up with [name] in 3 weeks. On follow-up patient in need a repeat CT chest done with contrast to follow-up on abnormality seen on CT chest during this hospitalization or some for superimposed bacterial infection. If patient CT scan has not improved patient may require bronchoscopy with fungal and AFB cultures. 2. Follow-up with Asres, Alehegn, MDIn 2 weeks.   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 Rob Hickman [name] is an 41 y.o. male with past medical history significant for aspergillosis along with MAI diagnosed in 2010 with known history of hemoptysis requiring resection as well as embolization last 2016 was in his usual state of health until 2 days prior to admission when he noted onset of significance amounts of frank blood with his cough. Patient thinks he had approximately 10 ounces of hemoptysis in total. Patient notes that he usually has specks of blood with his phlegm in the morning at baseline. 2 days ago however he noted only blood with no phlegm associated with his cough. He presented to [facility] where chest x-ray revealed new onset of circumscribed lesion in left upper lobe with infiltration. He was admitted to stepdown  CBC was followed and was stable and was transferred here upon his request. He has not had any further hemoptysis for the past 27 hours.  Patient denies any fevers or chills. He states that he last took his aspergilloma medications sometime last week. He notes no change in baseline functioning until 2 days ago when he started having hemoptysis.   At present patient feels at baseline. He notes that he is dyspneic when he is coughing but does not have any dyspnea at baseline. 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 Patient notes he knows how much she can do and manages his baseline dyspnea with behavior modification.  From previous note:  2010. . Diagnosed with MAI due to significant cough - started on azithromycin and ethambutol   2012: diagnosed with aspergilloma causing hemoptysis and had a left upper lobe posterior segmentectomy - started on voriconazole  later diagnosed with ABPA as well given very high IgE levels (> 4000 on 03/07/2011). There was no speciation done on the Aspergillus species. - started on prednisone   Tried off azithromycin/ethambutol in 2013: but developed fever/cough - cultures for AFB were negative but patient had symptomatic improvement on the meds so restarted   2016: hospitalized with hemoptysis: underwent IR guided embolization and found to have an area in the upper lobe consistent with an aspergilloma. He underwent bronchoscopy with BAL and cultures ultimately yielded a Saccharomyces species on fungal culture. His serum galactomannan was negative.  He has been on voriconazole and has had radiographic improvement and his x-ray findings. He also feelt etter symptomatically with no more hemoptysis improvement in his cough  improvement in his energy though he still feels not nearly as well as he did prior to his hospitalization.   Has been evaluated at [facility] for possible lung transplant but rejected 3 months ago  Hospital Course:   #1 hemoptysis/infiltrate on chest x-ray Patient was transferred from outside hospital of [place] with a history of pulmonary aspergilloma and MAI with history of hemoptysis requiring left upper lobe apical posterior embolectomy 2012, and embolization 2016. Total amount of hemoptysis obtained range from 2 ounces to 10 ounces. Patient patient with no further hemoptysis over the past 48 hours. Sputum Gram stain and culture were ordered. Patient was sitting consultation by pulmonary and critical care medicine and patient started on Augmentin to be chewed for total of 21 days. Was recommended by pulmonary that on follow-up in the outpatient setting patient will need a repeat chest CT with contrast done for follow-up in the pulmonary clinic and pending results of the CT scan will be determined as to whether patient needs a bronchoscopy with fungal and AFB cultures. Patient remained stable during this hospitalization with clinical improvement and no further hemoptysis. Outpatient follow-up with pulmonary.  #2 history of aspergillosis and MAI Patient was continued on home regimen of voriconazole, prednisone, ethambutol, azithromycin.  #3 COPD Remained stable. Patient was maintained on Spiriva and nebs as needed.  #4 hypothyroidism Patient maintained on home regimen of Synthroid.  Procedures:  CT chest with contrast 08/09/2017  Consultations:  Critical care medicine Dr. Jimmey Ralph 08/09/2017  Discharge Exam: Vitals:   08/10/17 0800 08/10/17 0920  BP: 107/69   Pulse: (!) 55   Resp: 17   Temp: 97.8 F (36.6 C)   SpO2: 96% 98%    General: NAD Cardiovascular: RRR Respiratory: CTAB.no wheezing. No crackles. No rhonchi.  Discharge Instructions   Discharge Instructions    Diet - low sodium heart healthy    Complete by:  As directed    Increase activity slowly    Complete by:  As directed      Current Discharge Medication List    START taking these  medications   Details  amoxicillin-clavulanate (AUGMENTIN) 875-125 MG tablet Take 1 tablet by mouth 2 (two) times daily. Take for 20 days. Qty: 40 tablet, Refills: 0      CONTINUE these medications which have NOT CHANGED   Details  albuterol (PROVENTIL HFA;VENTOLIN HFA) 108 (90 Base) MCG/ACT inhaler Inhale 2 puffs into the lungs every 6 (six) hours as needed. Qty: 1 Inhaler, Refills: 4    azithromycin (ZITHROMAX) 500 MG tablet Take 500 mg by mouth daily. Refills: 1    dextromethorphan-guaiFENesin (TUSSIN DM) 10-100 MG/5ML liquid Take 10 mLs by mouth every 4 (four) hours as needed for cough. Qty: 118 mL, Refills: 0    EPINEPHrine 0.3 mg/0.3 mL IJ SOAJ injection Inject 0.3 mLs (0.3 mg total) into the muscle once. Qty: 1 Device, Refills: 11    ethambutol (MYAMBUTOL) 400 MG tablet TAKE TWO AND ONE-HALF TABLETS BY MOUTH ONCE DAILY Qty: 225 tablet, Refills: 5   Associated Diagnoses: 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 (HCC)    ferrous sulfate (FERRO-BOB) 325 (65 FE) MG tablet Take 325 mg by mouth daily.      fluticasone furoate-vilanterol (BREO ELLIPTA) 100-25 MCG/INH AEPB Inhale 1 puff into the lungs daily. Qty: 120 each, Refills: 0   Associated Diagnoses: Severe persistent asthma, uncomplicated    levothyroxine (SYNTHROID, LEVOTHROID) 75 MCG tablet Take 75 mcg by mouth daily.  Refills: 0    !!  omalizumab (XOLAIR) 150 MG injection Inject 375 mg into the skin every 14 (fourteen) days.    predniSONE (DELTASONE) 5 MG tablet Take 5 mg by mouth daily with breakfast.    SPIRIVA HANDIHALER 18 MCG inhalation capsule PLACE 1 CAPSULE INTO INHALER AND INHALE DAILY Qty: 30 capsule, Refills: 1    voriconazole (VFEND) 200 MG tablet Take 1 tablet (200 mg total) by mouth 2 (two) times daily. Qty: 180 tablet, Refills: 3   Associated Diagnoses: Mycobacterium avium complex ([place]); Allergic bronchopulmonary aspergillosis (HCC)    alendronate (FOSAMAX) 70 MG tablet Take 1 tablet (70 mg total) by  mouth once a week. Take with a full glass of water on an empty stomach. Qty: 4 tablet, Refills: 11    !! XOLAIR 150 MG injection INJECT 375MG SUBCUTANEOUSLY EVERY 2 WEEKS. Qty: 6 each, Refills: 5     !! - Potential duplicate medications found. Please discuss with provider.     Allergies  Allergen Reactions  . Clarithromycin Other (See Comments)    Adverse reaction w/ voriconazole  . Rifaximin Other (See Comments)    Adverse reaction w/ voriconazole   Follow-up Information    Brand Males, MD. Schedule an appointment as soon as possible for a visit in 3 week(s).   Specialty:  Pulmonary Disease Why:  f/u in 3 weeks. Contact information: [place] [address] [phone]        Wallene Dales, MD. Schedule an appointment as soon as possible for a visit in 2 week(s).   Specialty:  Family Medicine Contact information: [address] [place] [address] [phone]            The results of significant diagnostics from this hospitalization (including imaging, microbiology, ancillary and laboratory) are listed below for reference.    Significant Diagnostic Studies: Ct Chest W Contrast  Result Date: 08/09/2017 CLINICAL DATA:  Dyspnea hemoptysis. EXAM: CT CHEST WITH CONTRAST TECHNIQUE: Multidetector CT imaging of the chest was performed during intravenous contrast administration. CONTRAST:  49m ISOVUE-300 IOPAMIDOL (ISOVUE-300) INJECTION 61% COMPARISON:  CT scan of October 23, 2016. FINDINGS: Cardiovascular: No significant vascular findings. Normal heart size. No pericardial effusion. Mediastinum/Nodes: No enlarged mediastinal, hilar, or axillary lymph nodes. Thyroid gland, trachea, and esophagus demonstrate no significant findings. Lungs/Pleura: Large emphysematous bulla are again noted in both upper lobes. However, there is interval development of fluid density in several bulla in the left upper lobe concerning for superimposed  infection. No pneumothorax or pleural effusion is noted. Stable tree-in-bud appearance is noted medially in the left lung posteriorly. Stable bilateral calcified pulmonary nodules are noted consistent with prior granulomatous disease. Upper Abdomen: Cholelithiasis is noted. Musculoskeletal: No chest wall abnormality. No acute or significant osseous findings. IMPRESSION: Large emphysematous bulla are noted in both upper lobes. There is interval development of fluid density in several bulla in the left upper lobe concerning for superimposed infection, possibly fungal in origin. Stable bilateral calcified pulmonary nodules are noted consistent with prior granulomatous disease. Stable tree in bud appearance is noted medially in the left lung posteriorly consistent with sequela of previous atypical infection. Cholelithiasis. Electronically Signed   By: JMarijo Conception M.D.   On: 08/09/2017 15:48    Microbiology: Recent Results (from the past 240 hour(s))  MRSA PCR Screening     Status: None   Collection Time: 08/09/17  6:00 AM  Result Value Ref Range Status   MRSA by PCR NEGATIVE NEGATIVE Final    Comment:  The GeneXpert MRSA Assay (FDA approved for NASAL specimens only), is one component of a comprehensive MRSA colonization surveillance program. It is not intended to diagnose MRSA infection nor to guide or monitor treatment for MRSA infections.      Labs: Basic Metabolic Panel:  Recent Labs Lab 08/09/17 1048 08/10/17 1046  NA 139 140  K 4.1 3.7  CL 104 108  CO2 28 28  GLUCOSE 123* 71  BUN 10 9  CREATININE 0.78 0.87  CALCIUM 8.7* 8.2*   Liver Function Tests:  Recent Labs Lab 08/09/17 1048  AST 18  ALT 12*  ALKPHOS 71  BILITOT 0.8  PROT 6.4*  ALBUMIN 3.3*   No results for input(s): LIPASE, AMYLASE in the last 168 hours. No results for input(s): AMMONIA in the last 168 hours. CBC:  Recent Labs Lab 08/09/17 1048 08/10/17 1046  WBC 13.2* 10.1  NEUTROABS 12.0*  8.7*  HGB 14.7 13.8  HCT 43.5 41.5  MCV 89.9 91.4  PLT 235 188   Cardiac Enzymes: No results for input(s): CKTOTAL, CKMB, CKMBINDEX, TROPONINI in the last 168 hours. BNP: BNP (last 3 results) No results for input(s): BNP in the last 8760 hours.  ProBNP (last 3 results) No results for input(s): PROBNP in the last 8760 hours.  CBG: No results for input(s): GLUCAP in the last 168 hours.     SignedIrine Seal MD.  [facility] 08/10/2017, 11:41 AM

## 2017-08-12 ENCOUNTER — Ambulatory Visit: Payer: BLUE CROSS/BLUE SHIELD

## 2017-08-26 ENCOUNTER — Ambulatory Visit (INDEPENDENT_AMBULATORY_CARE_PROVIDER_SITE_OTHER): Payer: BLUE CROSS/BLUE SHIELD

## 2017-08-26 ENCOUNTER — Ambulatory Visit (INDEPENDENT_AMBULATORY_CARE_PROVIDER_SITE_OTHER)
Admission: RE | Admit: 2017-08-26 | Discharge: 2017-08-26 | Disposition: A | Discharge status: Home / Self Care | Payer: BLUE CROSS/BLUE SHIELD | Source: Ambulatory Visit | Attending: Adult Health | Admitting: Adult Health

## 2017-08-26 ENCOUNTER — Ambulatory Visit (INDEPENDENT_AMBULATORY_CARE_PROVIDER_SITE_OTHER): Payer: BLUE CROSS/BLUE SHIELD | Admitting: Adult Health

## 2017-08-26 ENCOUNTER — Encounter: Payer: Self-pay | Admitting: Adult Health

## 2017-08-26 VITALS — BP 112/72 | HR 51 | Ht 69.0 in | Wt 125.2 lb

## 2017-08-26 DIAGNOSIS — A310 Pulmonary mycobacterial infection: Secondary | ICD-10-CM | POA: Diagnosis not present

## 2017-08-26 DIAGNOSIS — J4540 Moderate persistent asthma, uncomplicated: Secondary | ICD-10-CM

## 2017-08-26 DIAGNOSIS — R042 Hemoptysis: Secondary | ICD-10-CM | POA: Diagnosis not present

## 2017-08-26 DIAGNOSIS — B4481 Allergic bronchopulmonary aspergillosis: Secondary | ICD-10-CM

## 2017-08-26 DIAGNOSIS — B449 Aspergillosis, unspecified: Secondary | ICD-10-CM

## 2017-08-26 DIAGNOSIS — B440 Invasive pulmonary aspergillosis: Secondary | ICD-10-CM

## 2017-08-26 DIAGNOSIS — J4550 Severe persistent asthma, uncomplicated: Secondary | ICD-10-CM | POA: Diagnosis not present

## 2017-08-26 MED ORDER — FLUTICASONE FUROATE-VILANTEROL 100-25 MCG/INH IN AEPB
1.0000 | INHALATION_SPRAY | Freq: Every day | RESPIRATORY_TRACT | 0 refills | Status: DC
Start: 2017-08-26 — End: 2017-10-22

## 2017-08-26 MED ORDER — FLUTICASONE FUROATE-VILANTEROL 100-25 MCG/INH IN AEPB: 1.0000 | INHALATION_SPRAY | Freq: Every day | RESPIRATORY_TRACT | 5 refills | Status: DC

## 2017-08-26 NOTE — Progress Notes (Signed)
_0  ID: [name], male    DOB: 02/20/1976, 41 y.o.   MRN: [id number]  Chief Complaint  Patient presents with  . Follow-up    hospital stay     Referring provider: Wallene Dales, MD  HPI: 41 year old male never smoker followed for ABPA , MAC and Aspergilloma .  Patient has a history of aspergilloma , status post VATS in 2012. He is on lifelong voriconazole, he also has ABPA on chronic prednisone at 98m daily . Followed at [facility] for MAC .    TEST  1.7/13 Ig E 800s 12/10/11 Sputum AFB  Smear and culture negative (prelim) 12/10/11 Fungal sputum culture - negative (final) 01/30/2012 spirometry - fev1 1.7L/41%, ratip 52 - BEST EVER 01/30/2012 walking desaturation test: 185 feet x 3 laps: did not deaturate 05/2014 >Spiro fev1 1.7L/40%, ratio 55  Spirometry 06/25/2017 showed FEV1 at 28%, ratio 46, FVC 50%.  Events  2010 Dx with MAI >Azithro/Ethambutol 2012 Dx with Aspergilloma >VATS >started on Voriconazaol  2012 Dx w/ ABPA w/ High IgE >4000>started on prednisone  2013 tried off azithr/Etham but developed fever, Cx neg but restarted on rx.  2016 Hospitalzed with hemoptysis >IR guided embolization . FOB/BAL + Saccharomyces on fungal fx . Serum Galactomannan was neg.  [name] evaluation 04/2017 for Transplant -rejected. Felt to early for transplant - recommended pre-transplant weight goal 128 lbs for a BMI > 19     08/26/2017 Follow up : ABPA, MAC, Aspergilloma , Hemoptysis.  Patient presents for a post hospital follow-up. Patient was recently admitted for hemoptysis. [facility] He was started on  Antibiotics with  Augmentin at discharge for a total 21 days. He has 3 days left of abx.  CT chest showed a large emphysematous bullae in both upper lobe. Interval development of a fluid density and several bullae in the left upper lobe concerning for superimposed infection. He was started on BREO .  He is starting to feel a lot better. No  further hemoptysis . Feels breathing is better on BREO .   Has hx of MAI and Aspergilloma .  .Marland KitchenRemains on Azithro/Ethambutol. And VFEND .  Follows with ID later this month.   Has ABPA on Prednisone 546mdaily , BREO and Spiriva daily . Xolair  injection every 2 weeks.   He does not require oxygen.   [facility]   Says he works full time 60 to 80 hr a week . We discussed healthy lifestyle/sleep/nutrition.    Allergies  Allergen Reactions  . Clarithromycin Other (See Comments)    Adverse reaction w/ voriconazole  . Rifaximin Other (See Comments)    Adverse reaction w/ voriconazole    Immunization History  Administered Date(s) Administered  . Influenza Split 10/29/2011, 08/14/2012  . Influenza Whole 09/19/2010  . Influenza,inj,Quad PF,6+ Mos 11/23/2013, 09/28/2014, 09/06/2015, 08/27/2016, 08/10/2017  . Pneumococcal Polysaccharide-23 12/17/2010    Past Medical History:  Diagnosis Date  . 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[facility]  . Esophageal reflux   . 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 Lung disease, bullous ([facility] [place]  . MAI (mycobacterium avium-intracellulare) ([facility]  . Mold exposure 12/05/2015  . Solar lentigo 08/08/2015  . Weight loss 10/15/2016    Tobacco History: History  Smoking Status  . Former Smoker  . Packs/day: 0.70  . Years: 19.00  . Types: Cigarettes  . Quit date:  11/20/2007  Smokeless Tobacco  . Former Systems developer  . Types: Snuff  . Quit date: 11/19/1994   Counseling given: Not Answered   Outpatient Encounter Prescriptions as of 08/26/2017  Medication Sig  . albuterol (PROVENTIL HFA;VENTOLIN HFA) 108 (90 Base) MCG/ACT inhaler Inhale 2 puffs into the lungs every 6 (six) hours as needed. (Patient taking differently: Inhale 2 puffs into the lungs every 6 (six) hours as needed for wheezing or shortness of breath. )  . alendronate (FOSAMAX) 70 MG tablet Take 1 tablet (70 mg total) by mouth once a week. Take  with a full glass of water on an empty stomach.  Marland Kitchen amoxicillin-clavulanate (AUGMENTIN) 875-125 MG tablet Take 1 tablet by mouth 2 (two) times daily. Take for 20 days.  Marland Kitchen azithromycin (ZITHROMAX) 500 MG tablet Take 500 mg by mouth daily.  Marland Kitchen dextromethorphan-guaiFENesin (TUSSIN DM) 10-100 MG/5ML liquid Take 10 mLs by mouth every 4 (four) hours as needed for cough.  . EPINEPHrine 0.3 mg/0.3 mL IJ SOAJ injection Inject 0.3 mLs (0.3 mg total) into the muscle once.  . ethambutol (MYAMBUTOL) 400 MG tablet TAKE TWO AND ONE-HALF TABLETS BY MOUTH ONCE DAILY (Patient taking differently: TAKE 1000 MG TABLETS BY MOUTH ONCE DAILY)  . ferrous sulfate (FERRO-BOB) 325 (65 FE) MG tablet Take 325 mg by mouth daily.    . fluticasone furoate-vilanterol (BREO ELLIPTA) 100-25 MCG/INH AEPB Inhale 1 puff into the lungs daily.  Marland Kitchen levothyroxine (SYNTHROID, LEVOTHROID) 75 MCG tablet Take 75 mcg by mouth daily.   Marland Kitchen omalizumab (XOLAIR) 150 MG injection Inject 375 mg into the skin every 14 (fourteen) days.  . predniSONE (DELTASONE) 5 MG tablet Take 5 mg by mouth daily with breakfast.  . SPIRIVA HANDIHALER 18 MCG inhalation capsule PLACE 1 CAPSULE INTO INHALER AND INHALE DAILY  . voriconazole (VFEND) 200 MG tablet Take 1 tablet (200 mg total) by mouth 2 (two) times daily.  Arvid Right 150 MG injection INJECT 375MG SUBCUTANEOUSLY EVERY 2 WEEKS.  . fluticasone furoate-vilanterol (BREO ELLIPTA) 100-25 MCG/INH AEPB Inhale 1 puff into the lungs daily.  . fluticasone furoate-vilanterol (BREO ELLIPTA) 100-25 MCG/INH AEPB Inhale 1 puff into the lungs daily.   Facility-Administered Encounter Medications as of 08/26/2017  Medication  . omalizumab Arvid Right) injection 375 mg     Review of Systems  Constitutional:   No  weight loss, night sweats,  Fevers, chills,  +fatigue, or  lassitude.  HEENT:   No headaches,  Difficulty swallowing,  Tooth/dental problems, or  Sore throat,                No sneezing, itching, ear ache, nasal  congestion, post nasal drip,   CV:  No chest pain,  Orthopnea, PND, swelling in lower extremities, anasarca, dizziness, palpitations, syncope.   GI  No heartburn, indigestion, abdominal pain, nausea, vomiting, diarrhea, change in bowel habits, loss of appetite, bloody stools.   Resp:   No chest wall deformity  Skin: no rash or lesions.  GU: no dysuria, change in color of urine, no urgency or frequency.  No flank pain, no hematuria   MS:  No joint pain or swelling.  No decreased range of motion.  No back pain.    Physical Exam  BP 112/72 (BP Location: Left Arm, Cuff Size: Normal)   Pulse (!) 51   Ht _0  (1.753 m)   Wt 125 lb 3.2 oz (56.8 kg)   SpO2 97%   BMI 18.49 kg/m   GEN: A/Ox3; pleasant , NAD, thin  HEENT:  [place]/AT,  EACs-clear, TMs-wnl, NOSE-clear, THROAT-clear, no lesions, no postnasal drip or exudate noted.   NECK:  Supple w/ fair ROM; no JVD; normal carotid impulses w/o bruits; no thyromegaly or nodules palpated; no lymphadenopathy.    RESP  Clear  P & A; w/o, wheezes/ rales/ or rhonchi. no accessory muscle use, no dullness to percussion  CARD:  RRR, no m/r/g, no peripheral edema, pulses intact, no cyanosis or clubbing.  GI:   Soft & nt; nml bowel sounds; no organomegaly or masses detected.   Musco: Warm bil, no deformities or joint swelling noted.   Neuro: alert, no focal deficits noted.    Skin: Warm, no lesions or rashes Multiple tatoos   Lab Results:  CBC    Component Value Date/Time   WBC 10.1 08/10/2017 1046   RBC 4.54 08/10/2017 1046   HGB 13.8 08/10/2017 1046   HCT 41.5 08/10/2017 1046   PLT 188 08/10/2017 1046   MCV 91.4 08/10/2017 1046   MCH 30.4 08/10/2017 1046   MCHC 33.3 08/10/2017 1046   RDW 14.0 08/10/2017 1046   LYMPHSABS 0.8 08/10/2017 1046   MONOABS 0.6 08/10/2017 1046   EOSABS 0.0 08/10/2017 1046   BASOSABS 0.0 08/10/2017 1046    BMET    Component Value Date/Time   NA 140 08/10/2017 1046   K 3.7 08/10/2017 1046   CL  108 08/10/2017 1046   CO2 28 08/10/2017 1046   GLUCOSE 71 08/10/2017 1046   BUN 9 08/10/2017 1046   CREATININE 0.87 08/10/2017 1046   CREATININE 0.78 04/08/2017 0959   CALCIUM 8.2 (L) 08/10/2017 1046   GFRNONAA >60 08/10/2017 1046   GFRNONAA >89 04/08/2017 0959   GFRAA >60 08/10/2017 1046   GFRAA >89 04/08/2017 0959    BNP No results found for: BNP  ProBNP    Component Value Date/Time   PROBNP 97.0 03/07/2011 0918    Imaging: Dg Chest 2 View  Result Date: 08/26/2017 CLINICAL DATA:  F/u from hospitalization for hemoptysis , no complains today, x-smoker, hx of Aspergilloma, asthma, EXAM: CHEST  2 VIEW COMPARISON:  CT/ 08/09/2017 and chest radiograph, 08/08/2017. FINDINGS: The and more dense consolidation noted within the left upper lobe shows significant improvement from the prior chest radiographs. There is extensive upper lobe bullous emphysema causing mass effect on the underlying lung, leading to bronchovascular crowding. There is also irregular scarring in the upper lobes below the Foley. This is similar to a chest radiograph dated 07/19/2016. No new lung abnormalities. Lungs are hyperexpanded. No pneumothorax or pleural effusion. Cardiac silhouette is normal in size. No mediastinal or hilar masses. Both hila are retracted superiorly, stable. Skeletal structures are demineralized. There are stable mild wedge-shaped compression deformities along the midthoracic spine. IMPRESSION: 1. There has been significant improvement since the prior exams consistent with improvement/ near resolution of the superimposed infection in the left upper lobe. 2. There are persistent extensive chronic changes of bullous emphysema and lung scarring. No new abnormalities. Electronically Signed   By: Lajean Manes M.D.   On: 08/26/2017 10:33   Ct Chest W Contrast  Result Date: 08/09/2017 CLINICAL DATA:  Dyspnea hemoptysis. EXAM: CT CHEST WITH CONTRAST TECHNIQUE: Multidetector CT imaging of the chest was  performed during intravenous contrast administration. CONTRAST:  109m ISOVUE-300 IOPAMIDOL (ISOVUE-300) INJECTION 61% COMPARISON:  CT scan of October 23, 2016. FINDINGS: Cardiovascular: No significant vascular findings. Normal heart size. No pericardial effusion. Mediastinum/Nodes: No enlarged mediastinal, hilar, or axillary lymph nodes. Thyroid gland, trachea, and esophagus  demonstrate no significant findings. Lungs/Pleura: Large emphysematous bulla are again noted in both upper lobes. However, there is interval development of fluid density in several bulla in the left upper lobe concerning for superimposed infection. No pneumothorax or pleural effusion is noted. Stable tree-in-bud appearance is noted medially in the left lung posteriorly. Stable bilateral calcified pulmonary nodules are noted consistent with prior granulomatous disease. Upper Abdomen: Cholelithiasis is noted. Musculoskeletal: No chest wall abnormality. No acute or significant osseous findings. IMPRESSION: Large emphysematous bulla are noted in both upper lobes. There is interval development of fluid density in several bulla in the left upper lobe concerning for superimposed infection, possibly fungal in origin. Stable bilateral calcified pulmonary nodules are noted consistent with prior granulomatous disease. Stable tree in bud appearance is noted medially in the left lung posteriorly consistent with sequela of previous atypical infection. Cholelithiasis. Electronically Signed   By: Marijo Conception, M.D.   On: 08/09/2017 15:48     Assessment & Plan:   Hemoptysis Recent admission with suspected infected bullae (bullae with fluid level on CT chest )  Improved with prolonged course of  Advised on healthy lifestyle, nutrition , sleep , exercise and good hand washing.  Finish Augmentin  Check cxr today .  Return on 4 weeks , may need repeat CT chest .   Mycobacterium avium infection ([place]) Cont on Azithr/Ethambutol  Cont follow up with  ID this month as planned.   Severe persistent asthma Cont on Xolair , BREO /Spiriva .   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   ABPA (allergic bronchopulmonary aspergillosis) (HCC) Cont on Prednisone 98m daily.       TRexene Edison NP 08/26/2017

## 2017-08-26 NOTE — Assessment & Plan Note (Signed)
Recent admission with suspected infected bullae (bullae with fluid level on CT chest )  Improved with prolonged course of  Advised on healthy lifestyle, nutrition , sleep , exercise and good hand washing.  Finish Augmentin  Check cxr today .  Return on 4 weeks , may need repeat CT chest .

## 2017-08-26 NOTE — Assessment & Plan Note (Signed)
Cont on Prednisone 11m daily.

## 2017-08-26 NOTE — Assessment & Plan Note (Signed)
Cont on Azithr/Ethambutol  Cont follow up with ID this month as planned.

## 2017-08-26 NOTE — Assessment & Plan Note (Signed)
Cont on VFEND  Follow up with ID as planned this month .

## 2017-08-26 NOTE — Assessment & Plan Note (Signed)
Cont on Xolair , Memory [facility] /Spiriva .

## 2017-08-26 NOTE — Patient Instructions (Addendum)
Finish Augmentin as directed.  Continue on current regimen .  Follow up with ID clinic as planned .  Chest xray today .  Follow up with [name] in 4 weeks and As needed   Please contact office for sooner follow up if symptoms do not improve or worsen or seek emergency care  Follow up with ID this month as planned.

## 2017-08-27 MED ORDER — OMALIZUMAB 150 MG ~~LOC~~ SOLR
375.0000 mg | SUBCUTANEOUS | Status: DC
  Administered 2017-08-26: 375 mg via SUBCUTANEOUS

## 2017-08-27 NOTE — Progress Notes (Signed)
Xolair injection documentation and charges entered by Genaro Bekker, RMA, based on injection sheet filled out by Tammy Scott per office protocol.   

## 2017-09-02 ENCOUNTER — Telehealth: Payer: Self-pay | Admitting: Internal Medicine

## 2017-09-02 NOTE — Telephone Encounter (Signed)
#  vials:6 Ordered date:09/02/17 Shipping Date:09/04/17

## 2017-09-03 DIAGNOSIS — B449 Aspergillosis, unspecified: Secondary | ICD-10-CM | POA: Diagnosis not present

## 2017-09-03 DIAGNOSIS — B4481 Allergic bronchopulmonary aspergillosis: Secondary | ICD-10-CM | POA: Diagnosis not present

## 2017-09-03 DIAGNOSIS — J4540 Moderate persistent asthma, uncomplicated: Secondary | ICD-10-CM | POA: Diagnosis not present

## 2017-09-03 DIAGNOSIS — A310 Pulmonary mycobacterial infection: Secondary | ICD-10-CM | POA: Diagnosis not present

## 2017-09-05 NOTE — Telephone Encounter (Signed)
#  Vials:6 Arrival Date:09/05/17 Lot #:[id number] Exp Date:01/2021

## 2017-09-09 ENCOUNTER — Ambulatory Visit (INDEPENDENT_AMBULATORY_CARE_PROVIDER_SITE_OTHER): Payer: BLUE CROSS/BLUE SHIELD

## 2017-09-09 DIAGNOSIS — J4550 Severe persistent asthma, uncomplicated: Secondary | ICD-10-CM

## 2017-09-10 MED ORDER — OMALIZUMAB 150 MG ~~LOC~~ SOLR
375.0000 mg | Freq: Once | SUBCUTANEOUS | Status: AC
  Administered 2017-09-09: 375 mg via SUBCUTANEOUS

## 2017-09-16 ENCOUNTER — Encounter: Payer: Self-pay | Admitting: Infectious Disease

## 2017-09-16 ENCOUNTER — Ambulatory Visit (INDEPENDENT_AMBULATORY_CARE_PROVIDER_SITE_OTHER): Payer: BLUE CROSS/BLUE SHIELD | Admitting: Infectious Disease

## 2017-09-16 VITALS — BP 122/78 | HR 59 | Temp 97.9°F | Ht 69.0 in | Wt 124.0 lb

## 2017-09-16 DIAGNOSIS — B440 Invasive pulmonary aspergillosis: Secondary | ICD-10-CM

## 2017-09-16 DIAGNOSIS — L568 Other specified acute skin changes due to ultraviolet radiation: Secondary | ICD-10-CM | POA: Insufficient documentation

## 2017-09-16 DIAGNOSIS — B449 Aspergillosis, unspecified: Secondary | ICD-10-CM

## 2017-09-16 DIAGNOSIS — A310 Pulmonary mycobacterial infection: Secondary | ICD-10-CM | POA: Diagnosis not present

## 2017-09-16 DIAGNOSIS — B4481 Allergic bronchopulmonary aspergillosis: Secondary | ICD-10-CM

## 2017-09-16 DIAGNOSIS — R042 Hemoptysis: Secondary | ICD-10-CM | POA: Diagnosis not present

## 2017-09-16 HISTORY — DX: Other specified acute skin changes due to ultraviolet radiation: L56.8

## 2017-09-16 MED ORDER — ISAVUCONAZONIUM SULFATE 186 MG PO CAPS: 2.0000 | ORAL_CAPSULE | ORAL | 0 refills | Status: DC

## 2017-09-16 NOTE — Progress Notes (Signed)
Chief complaint: admission with hemoptysis  Subjective:  Chief complaint:  : Patient ID: [name], male    DOB: Dec 23, 1975, 41 y.o.   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 We had taken him off his Mycobacterium avium drugs and then he developed fevers chills and malaise  Spring (2013) and was seen by my partner Dr. Megan Salon in late April  His AFB smears and cultures obtained in the clinic on that day prior to the initiation of azithromycin ethambutol ultimately failed to grow Mycobacterium avium. He had been doing well I saw him in [place] 2013 we discontinued his azithromycin and ethambutol again. Unfortunately he again shortly thereafter developed fevers cough malaise. He started back on azithromycin ethambutol. Cultures done for AFB here were again negative. The patient however felt dramatically better having been back on azithromycin and ethambutol.   I last saw him in January of 2015 when he was feeling relatively well.  He did have an episode of coughing up a few flecks of blood in January 2016 which prompted appt with [facility] but by time he made appt this had long since resolved. Was only a one time episode none since then.  I saw again in March and then in September 2016. In summer he was hospitalized with hemoptysis and found to have an area in the upper lobe consistent with an aspergilloma. He underwent bronchoscopy with BAL and cultures ultimately yielded a Saccharomyces species on fungal culture. His serum galactomannan was negative.  He has been  on voriconazole and axial has had radiographic improvement and his x-ray findings. He also feelt etter symptomatically with no  more hemoptysis improvement in his cough improvement in his energy though he still feels not nearly as well as he did prior to his hospitalization. He is avoiding exercised during the winter as it often precipitates coughing spells. He was  only taking 200 mg twice a day of voriconazole rather than the 300 mg he has continued this along with his ethambutol and azithromycin. He continued to have cough more so with exercise and has not been exercising at "[employer]" due to the fact this elicits more coughing. He has not smoked since 2007. He is seeing [name] and is being considered for new drug infusion to treat his COPD.  He did have 20# weight loss that he attributed to increased exercise.  Since his last visit with me he is seen [name] after an episode of hematemesis. 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 He said he only coughed up a small amount of blood material and did not go to the ER but was seen and followed by pulmonary. Had a chest x-ray which showed no new findings.  He was going to be evaluated by the transplant team at [facility].  He did complain of hyperpigmented spots on sun exposed areas on his body including arms and neck which his dermatologist have told him is due to his voriconazole use.   We considered change at that time  Since that time he has [place] been admitted with hemoptysis at [facility]. This began on Thursday and did not remit for5 hours so he came to ED. He was in  the hosptial briefly as hemoptysis resolved within 24 hours.  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 There is interval development of fluid density in several bulla in the left upper lobe concerning for superimposed infection, possibly fungal in origin.  Stable bilateral calcified pulmonary nodules are noted consistent with prior granulomatous disease.  Stable tree in bud appearance is noted  medially in the left lung posteriorly consistent with sequela of previous atypical infection.  He was rx Augmentin and his coughing is back to baseline. He has not had furtther weight loss. Sun exposed skin rash persists worse in sun and he tries to cover arms with long sleeved shirts.    Past Medical History:  Diagnosis Date  . 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[place])   . Esophageal reflux   . 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 Lung disease, bullous ([place])   . MAI (mycobacterium avium-intracellulare) ([place])   . Mold exposure 12/05/2015  . Solar lentigo 08/08/2015  . Weight loss 10/15/2016   Past Surgical History:  Procedure Laterality Date  . left VATS  2012   thoracotomy and LUL apical posterior segmentectomy  . VIDEO BRONCHOSCOPY Bilateral 10/20/2015   Procedure: VIDEO BRONCHOSCOPY WITHOUT FLUORO;  Surgeon: Marshell Garfinkel, MD;  Location: [place];  Service: Cardiopulmonary;  Laterality: Bilateral;   Family History  Problem Relation Age of Onset  . Adopted: Yes   Social History  Substance Use Topics  . Smoking status: Former Smoker    Packs/day: 0.70    Years: 19.00    Types: Cigarettes    Quit date: 11/20/2007  . Smokeless tobacco: Former Systems developer    Types: Snuff    Quit date: 11/19/1994  . Alcohol use No    Allergies  Allergen Reactions  . Clarithromycin Other (See Comments)    Adverse reaction w/ voriconazole  . Rifaximin Other (See Comments)    Adverse reaction w/ voriconazole    Current Outpatient Prescriptions:  .  albuterol (PROVENTIL HFA;VENTOLIN HFA) 108 (90 Base) MCG/ACT inhaler, Inhale 2 puffs into the lungs every 6 (six) hours as needed. (Patient taking differently: Inhale 2 puffs into the lungs every 6 (six) hours as needed for wheezing or shortness of breath. ), Disp: 1 Inhaler, Rfl: 4 .  alendronate (FOSAMAX) 70 MG tablet, Take 1 tablet (70 mg total) by mouth once a week. Take with a full glass of water on an empty stomach., Disp: 4 tablet, Rfl: 11 .   azithromycin (ZITHROMAX) 500 MG tablet, Take 500 mg by mouth daily., Disp: , Rfl: 1 .  dextromethorphan-guaiFENesin (TUSSIN DM) 10-100 MG/5ML liquid, Take 10 mLs by mouth every 4 (four) hours as needed for cough., Disp: 118 mL, Rfl: 0 .  EPINEPHrine 0.3 mg/0.3 mL IJ SOAJ injection, Inject 0.3 mLs (0.3 mg total) into the muscle once., Disp: 1 Device, Rfl: 11 .  ethambutol (MYAMBUTOL) 400 MG tablet, TAKE TWO AND ONE-HALF TABLETS BY MOUTH ONCE DAILY (Patient taking differently: TAKE 1000 MG TABLETS BY MOUTH ONCE DAILY), Disp: 225 tablet, Rfl: 5 .  ferrous sulfate (FERRO-BOB) 325 (65 FE) MG tablet, Take 325 mg by mouth daily.  , Disp: , Rfl:  .  fluticasone furoate-vilanterol (BREO ELLIPTA) 100-25 MCG/INH AEPB, Inhale 1 puff into the lungs daily., Disp: 120 each, Rfl: 0 .  fluticasone furoate-vilanterol (BREO ELLIPTA) 100-25 MCG/INH AEPB, Inhale 1 puff into the lungs daily., Disp: 1 each, Rfl: 5 .  fluticasone furoate-vilanterol (BREO ELLIPTA) 100-25 MCG/INH AEPB,  Inhale 1 puff into the lungs daily., Disp: 2 each, Rfl: 0 .  levothyroxine (SYNTHROID, LEVOTHROID) 75 MCG tablet, Take 75 mcg by mouth daily. , Disp: , Rfl: 0 .  predniSONE (DELTASONE) 5 MG tablet, Take 5 mg by mouth daily with breakfast., Disp: , Rfl:  .  SPIRIVA HANDIHALER 18 MCG inhalation capsule, PLACE 1 CAPSULE INTO INHALER AND INHALE DAILY, Disp: 30 capsule, Rfl: 1 .  voriconazole (VFEND) 200 MG tablet, Take 1 tablet (200 mg total) by mouth 2 (two) times daily., Disp: 180 tablet, Rfl: 3 .  XOLAIR 150 MG injection, INJECT 375MG SUBCUTANEOUSLY EVERY 2 WEEKS., Disp: 6 each, Rfl: 5    Review of Systems  Constitutional: Negative for activity change, appetite change, chills, diaphoresis, fatigue, fever and unexpected weight change.  HENT: Negative for congestion, rhinorrhea, sinus pressure, sneezing, sore throat and trouble swallowing.   Eyes: Negative for photophobia and visual disturbance.  Respiratory: Positive for cough and shortness  of breath. Negative for chest tightness, wheezing and stridor.   Cardiovascular: Negative for chest pain, palpitations and leg swelling.  Gastrointestinal: Negative for abdominal distention, abdominal pain, anal bleeding, blood in stool, constipation, diarrhea, nausea and vomiting.  Genitourinary: Negative for difficulty urinating, dysuria, flank pain and hematuria.  Musculoskeletal: Negative for arthralgias, back pain, gait problem, joint swelling and myalgias.  Skin: Positive for rash. Negative for color change, pallor and wound.  Neurological: Negative for dizziness, tremors, weakness and light-headedness.  Hematological: Negative for adenopathy. Does not bruise/bleed easily.  Psychiatric/Behavioral: Negative for agitation, behavioral problems, confusion, decreased concentration, dysphoric mood and sleep disturbance.       Objective:   Physical Exam  Constitutional: He is oriented to person, place, and time. He appears well-nourished. No distress.  HENT:  Head: Normocephalic and atraumatic.  Eyes: Pupils are equal, round, and reactive to light. Conjunctivae and EOM are normal. No scleral icterus.  Neck: Normal range of motion. Neck supple.  Cardiovascular: Normal rate, regular rhythm and normal heart sounds.  Exam reveals no gallop and no friction rub.   No murmur heard. Pulmonary/Chest: No respiratory distress. He has decreased breath sounds in the right lower field and the left lower field.  Prolonged expiratory phase  Abdominal: He exhibits no distension. There is no tenderness. There is no rebound.  Musculoskeletal: He exhibits no edema or tenderness.  Lymphadenopathy:    He has no cervical adenopathy.  Neurological: He is alert and oriented to person, place, and time. He exhibits normal muscle tone. Coordination normal.  Skin: Skin is warm and dry. Rash noted. He is not diaphoretic. No erythema. No pallor.     Psychiatric: He has a normal mood and affect. His behavior is  normal. Judgment and thought content normal.    Hyperpigmented area on neck C picture 04/08/2017:          Assessment & Plan:   Aspergilloma: continue lifelong antifungal therapy. Will check Voriconazole level though it will be 2 hours after it would  Have been a trough. We will try to change him to Isavuconazole To see if this helps with skin lesions. I will also reach out to [facility] to see if they could be of assistance to him  Allergic bronchopulmonary aspgergillosis: As above, I would be more comfortable with thim being on long standing antifungal therapy. He is still on steroids as well  Sun exposed rash from voriconozole: seems worse on exma to me on arm esp. We will try to change him to a different azole that  might not have this toxicity.   Mycobacterium avium pulmonary infection: We have been reluctant to stop these medications due to prior flares when he comes off the medication indications.    New hemoptysis in September: this time lasted 5 hours before he came in. Resolved within 24 hours.   I have reviewed the CT scan images personally and agree with the finding of RUL fluid density concerning for fungal infection

## 2017-09-16 NOTE — Progress Notes (Signed)
HPI: [name] is a 41 y.o. male who presents to the [facility] clinic to follow-up with Dr. Tommy Medal for his aspergilloma.  Allergies: Allergies  Allergen Reactions  . Clarithromycin Other (See Comments)    Adverse reaction w/ voriconazole  . Rifaximin Other (See Comments)    Adverse reaction w/ voriconazole    Past Medical History: Past Medical History:  Diagnosis Date  . 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[place])   . Esophageal reflux   . 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 Lung disease, bullous ([place])   . MAI (mycobacterium avium-intracellulare) ([place])   . Mold exposure 12/05/2015  . Solar lentigo 08/08/2015  . Weight loss 10/15/2016    Social History: Social History   Social History  . Marital status: Single    Spouse name: N/A  . Number of children: N/A  . Years of education: N/A   Occupational History  . works 2 jobs    Social History Main Topics  . Smoking status: Former Smoker    Packs/day: 0.70    Years: 19.00    Types: Cigarettes    Quit date: 11/20/2007  . Smokeless tobacco: Former Systems developer    Types: Snuff    Quit date: 11/19/1994  . Alcohol use No  . Drug use: No  . Sexual activity: Not Asked   Other Topics Concern  . None   Social History Narrative   Pt is adopted    Current Regimen: Voriconazole  Labs: HIV 1 RNA Quant (copies/mL)  Date Value  05/28/2011 <20   CD4 T Cell Abs (cmm)  Date Value  05/28/2011 1520    CrCl: CrCl cannot be calculated (Patient's most recent lab result is older than the maximum 21 days allowed.).  Lipids:    Component Value Date/Time   CHOL 163 05/28/2011 0942   TRIG 74 05/28/2011 0942   HDL 92 05/28/2011 0942   CHOLHDL 1.8 05/28/2011 0942   VLDL 15 05/28/2011 0942   LDLCALC 56 05/28/2011 0942    Assessment: [place] is here today to follow-up with Dr. Tommy Medal for his chronic aspergilloma infection.  He has been on voriconazole chronically for quite some time.  He is experiencing side effects associated with  voriconazole including hyperpigmentation of his skin - arms, neck, and back, especially when exposed to sunlight. Will try a different azole for him.  Posaconazole also associated with skin discoloration/hyperpigmentation.  The new azole, isavuconazole, doesn't seem to be as associated with skin disorders as the other two.  [facility] see if insurance will cover. If not, patient is willing to stay on voriconazole.  Dr. Tommy Medal is seeing if he can be seen at [place]. He will need a loading dose for 2 days followed by daily maintenance dose.  Will send in loading dose plus daily dose x 1 month.  Will need new Rx sent in for next month - 2 capsules by mouth once daily.   Plans: - Isavuconazonium sulfate 372 mg PO TID x 2 days then 372 mg PO daily - [facility] [place] - F/u with Dr. Tommy Medal in 3 months  Eshani Maestre L. Giorgio Chabot, PharmD, [facility] 09/16/2017, 9:40 AM

## 2017-09-17 ENCOUNTER — Other Ambulatory Visit: Payer: Self-pay | Admitting: Pharmacist

## 2017-09-18 ENCOUNTER — Telehealth: Payer: Self-pay | Admitting: Pharmacist

## 2017-09-18 ENCOUNTER — Other Ambulatory Visit: Payer: Self-pay | Admitting: Pharmacist

## 2017-09-18 DIAGNOSIS — B440 Invasive pulmonary aspergillosis: Secondary | ICD-10-CM

## 2017-09-18 DIAGNOSIS — B449 Aspergillosis, unspecified: Secondary | ICD-10-CM

## 2017-09-18 MED ORDER — ISAVUCONAZONIUM SULFATE 186 MG PO CAPS: 2.0000 | ORAL_CAPSULE | ORAL | 0 refills | Status: DC

## 2017-09-18 NOTE — Telephone Encounter (Signed)
Called isavuconazonium sulfate 186 mg capsules - Take 2 capsules PO TID x 2 days then 2 capsules by mouth daily - [facility] to [facility] [place] (this is where patient has been getting his voriconazole). Phone number [phone] (unable to send electronically). They will send PA directly to [phone] if needed.  Will keep Dr. Tommy Medal updated.

## 2017-09-18 NOTE — Telephone Encounter (Signed)
Excellent!

## 2017-09-19 LAB — VORICONAZOLE QUANT BY LC/MS: Voriconazole, Quant, by LC/MS: 0.9 ug/mL

## 2017-09-23 ENCOUNTER — Ambulatory Visit (INDEPENDENT_AMBULATORY_CARE_PROVIDER_SITE_OTHER): Payer: BLUE CROSS/BLUE SHIELD

## 2017-09-23 DIAGNOSIS — J4540 Moderate persistent asthma, uncomplicated: Secondary | ICD-10-CM | POA: Diagnosis not present

## 2017-09-23 NOTE — Progress Notes (Signed)
I'm still in the process of trying to get that approved.. They are supposed to fax me a PA if needed.  Will check on soon!

## 2017-09-24 ENCOUNTER — Telehealth: Payer: Self-pay | Admitting: Pharmacist

## 2017-09-24 MED ORDER — OMALIZUMAB 150 MG ~~LOC~~ SOLR
375.0000 mg | SUBCUTANEOUS | Status: DC
  Administered 2017-09-23: 375 mg via SUBCUTANEOUS

## 2017-09-24 NOTE — Telephone Encounter (Signed)
Thanks Cassie

## 2017-09-24 NOTE — Telephone Encounter (Signed)
Called [facility] in [place] for an update on patient's Cresemba Rx. It is still being processed. I had them put a rush on it and gave my number again to do a PA if needed. Will keep calling for updates.

## 2017-09-24 NOTE — Progress Notes (Signed)
Documentation of medication administration and charges of Xolair have been completed by Desmond Dike, CMA based on the [place] documentation sheet completed by [facility].

## 2017-09-26 ENCOUNTER — Other Ambulatory Visit: Payer: Self-pay | Admitting: Pharmacist

## 2017-09-26 NOTE — Telephone Encounter (Signed)
Perfect!

## 2017-09-26 NOTE — Telephone Encounter (Signed)
Completed Prior Auth today for Cresemba and sent to plan FYI.

## 2017-09-27 ENCOUNTER — Telehealth: Payer: Self-pay | Admitting: Pharmacist

## 2017-09-27 NOTE — Telephone Encounter (Signed)
Prior authorization was approved for [name]'s Cresemba! Also with $0 charge to patient (gave them co-pay card information just in case).  Called [place] to let him know but there was no answer. Left a generic voicemail to call me back. [facility] in [place] said they would reach out to patient soon for delivery.

## 2017-09-30 NOTE — Telephone Encounter (Signed)
eXCELLENT!

## 2017-10-01 ENCOUNTER — Telehealth: Payer: Self-pay | Admitting: Internal Medicine

## 2017-10-01 NOTE — Telephone Encounter (Signed)
#  vials:6 Ordered date:10/01/17 Shipping Date:10/03/17

## 2017-10-03 DIAGNOSIS — B4481 Allergic bronchopulmonary aspergillosis: Secondary | ICD-10-CM | POA: Diagnosis not present

## 2017-10-03 DIAGNOSIS — A310 Pulmonary mycobacterial infection: Secondary | ICD-10-CM | POA: Diagnosis not present

## 2017-10-03 DIAGNOSIS — B449 Aspergillosis, unspecified: Secondary | ICD-10-CM | POA: Diagnosis not present

## 2017-10-03 DIAGNOSIS — J4540 Moderate persistent asthma, uncomplicated: Secondary | ICD-10-CM | POA: Diagnosis not present

## 2017-10-04 NOTE — Telephone Encounter (Signed)
#  Vials:6 Arrival Date:10/04/17  Lot #:[id number] Exp Date:01/17/2021

## 2017-10-07 ENCOUNTER — Ambulatory Visit (INDEPENDENT_AMBULATORY_CARE_PROVIDER_SITE_OTHER): Payer: BLUE CROSS/BLUE SHIELD

## 2017-10-07 DIAGNOSIS — J4540 Moderate persistent asthma, uncomplicated: Secondary | ICD-10-CM | POA: Diagnosis not present

## 2017-10-08 ENCOUNTER — Ambulatory Visit: Payer: Self-pay | Admitting: Internal Medicine

## 2017-10-08 MED ORDER — OMALIZUMAB 150 MG ~~LOC~~ SOLR
375.0000 mg | SUBCUTANEOUS | Status: DC
Start: 2017-10-08 — End: 2017-12-17
  Administered 2017-10-07 – 2017-10-21 (×2): 375 mg via SUBCUTANEOUS

## 2017-10-08 NOTE — Progress Notes (Signed)
Xolair injection documentation and charges entered by Maury Dus, RMA, based on injection sheet filled out by Alroy Bailiff per office protocol.

## 2017-10-21 ENCOUNTER — Other Ambulatory Visit: Payer: Self-pay | Admitting: Infectious Disease

## 2017-10-21 ENCOUNTER — Ambulatory Visit (INDEPENDENT_AMBULATORY_CARE_PROVIDER_SITE_OTHER): Payer: BLUE CROSS/BLUE SHIELD

## 2017-10-21 ENCOUNTER — Telehealth: Payer: Self-pay | Admitting: Pharmacist

## 2017-10-21 ENCOUNTER — Other Ambulatory Visit: Payer: Self-pay | Admitting: Pharmacist

## 2017-10-21 DIAGNOSIS — J4550 Severe persistent asthma, uncomplicated: Secondary | ICD-10-CM | POA: Diagnosis not present

## 2017-10-21 DIAGNOSIS — B440 Invasive pulmonary aspergillosis: Secondary | ICD-10-CM

## 2017-10-21 DIAGNOSIS — J4540 Moderate persistent asthma, uncomplicated: Secondary | ICD-10-CM

## 2017-10-21 DIAGNOSIS — B449 Aspergillosis, unspecified: Secondary | ICD-10-CM

## 2017-10-21 MED ORDER — ISAVUCONAZONIUM SULFATE 186 MG PO CAPS: 372.0000 mg | ORAL_CAPSULE | Freq: Every day | ORAL | 11 refills | Status: DC

## 2017-10-21 NOTE — Telephone Encounter (Signed)
Received refill request from [facility] for patient's Cresemba.  Called pharmacy and made sure the Rx on fill was correct. He is taking Cresemba 372 mg (2 tablets) by mouth once daily. Called [place] to see how he is doing on the [place].  He states his sun spots aren't getting worse and he isn't having any side effects. He seems to be tolerating it well. It cost him $0 last month, so he is happy about that.  He will f/u again with Dr. Tommy Medal in January.

## 2017-10-21 NOTE — Telephone Encounter (Signed)
That's awesome!!

## 2017-10-22 ENCOUNTER — Ambulatory Visit: Payer: BLUE CROSS/BLUE SHIELD | Admitting: Internal Medicine

## 2017-10-22 ENCOUNTER — Encounter: Payer: Self-pay | Admitting: Internal Medicine

## 2017-10-22 VITALS — BP 108/74 | HR 77 | Ht 69.0 in | Wt 124.4 lb

## 2017-10-22 DIAGNOSIS — J4550 Severe persistent asthma, uncomplicated: Secondary | ICD-10-CM

## 2017-10-22 DIAGNOSIS — B4481 Allergic bronchopulmonary aspergillosis: Secondary | ICD-10-CM

## 2017-10-22 DIAGNOSIS — B449 Aspergillosis, unspecified: Secondary | ICD-10-CM

## 2017-10-22 MED ORDER — OMALIZUMAB 150 MG ~~LOC~~ SOLR: 375.0000 mg | Freq: Once | SUBCUTANEOUS | Status: DC

## 2017-10-22 MED ORDER — TIOTROPIUM BROMIDE MONOHYDRATE 2.5 MCG/ACT IN AERS: 2.0000 | INHALATION_SPRAY | Freq: Every day | RESPIRATORY_TRACT | 5 refills | Status: DC

## 2017-10-22 NOTE — Patient Instructions (Addendum)
ICD-10-CM   1. Severe persistent asthma without complication F84.03   2. 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[place]) B44.9   3. ABPA (allergic bronchopulmonary aspergillosis) ([facility]) B44.81     Stable disease and glad better with hemoptysis  Plan Continue prednisone, xolair, breo and spiriva schedule; but change spiriva to respimat format Albuterol as needed Continue ID crezemba per Dr Tommy Medal Cut down work load to 40h/week  Followup 3 months or soonerif needed

## 2017-10-22 NOTE — Progress Notes (Signed)
Subjective:     Patient ID: [name], male   DOB: 1976-10-29, 41 y.o.   MRN: [id number]  HPI   male never smoker followed for ABPA , MAC and Aspergilloma .  Patient has a history of aspergilloma , status post VATS in 2012. 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 Followed at [facility] for MAC .    TEST  1.7/13 Ig E 800s 12/10/11 Sputum AFB  Smear and culture negative (prelim) 12/10/11 Fungal sputum culture - negative (final) 01/30/2012 spirometry - fev1 1.7L/41%, ratip 52 - BEST EVER 01/30/2012 walking desaturation test: 185 feet x 3 laps: did not deaturate 05/2014 >Spiro fev1 1.7L/40%, ratio 55  Spirometry 06/25/2017 showed FEV1 at 28%, ratio 46, FVC 50%.  Events  2010 Dx with MAI >Azithro/Ethambutol 2012 Dx with Aspergilloma >VATS >started on Voriconazaol  2012 Dx w/ ABPA w/ High IgE >4000>started on prednisone  2013 tried off azithr/Etham but developed fever, Cx neg but restarted on rx.  2016 Hospitalzed with hemoptysis >IR guided embolization . FOB/BAL + Saccharomyces on fungal fx . Serum Galactomannan was neg.  [name] evaluation 04/2017 for Transplant -rejected. Felt to early for transplant - recommended pre-transplant weight goal 128 lbs for a BMI > 19          HPI   OV 01/16/2016  Chief Complaint  Patient presents with  . Follow-up    Pt denies any increased dyspnea at this time - states that it comes and goes with activity. Pt states it is different on a daily basis. Denies hemoptysis since last OV.   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 Since then he overall feels that his dyspnea is not back to baseline. He feels he has had a new lower baseline. He has more fatigued than usual. But he is stable without any exacerbation. He feels in the next few years ago started to become permanently disabled. He currently works as a cBiomedical scientist  There are no fevers.   Last chest x-ray 11/07/2015: Shows hyperinflation personally visualized Last IgE January 2013 was 841 and significantly improved from 4000 2012.  Last CBC January 2017 with normal used to flows are 100 cells per cubic millimeter.   OV 08/27/2016  Chief Complaint  Patient presents with  . Follow-up    Pt states his cough and SOB is at baseline. Pt states he has hemoptysis around once a week at most - penny size of mucus. Pt denies CP/tightness and f/c/s.      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 He status post IR guided embolization of hemoptysis end 2016.   Last visit feb 2017 he was reporting worsening dyspnea. We checked his IgE. Improved significantly but it was still in the 700s. We started Xolair therapy. He's been doing this now for a few months. He says this has helped his dyspnea symptoms significantly. However at end of 2 weeks he starts getting more symptomatic. Overall he is stable. 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 There are no fevers. Here, function test today FEV1 is 1.65 L/42% postbronchodilator. This is similar to 2014 but improved compared to 2015. Off note he tells me that he is mild hemoptysis once a month this is stable. He says this is actually been going on since November  2016 when he had his embolization. This no change in this. He knows that he has to monitor his hemoptysis volume. He will have his flu shot today  Last chest x-ray 11/07/2015: Shows hyperinflation personally visualized Last IgE FEb 2017 was 717 and started xolair (January 2013 was 841 and significantly improved from 4000 in  2012)   OV 03/18/2017  Chief Complaint  Patient presents with  . Follow-up    6 mo f/u. Breathing has been the same since the last visit. Denies any increased SOB or chest pains.    Follow-up severe persistent asthma with ABPA and aspergilloma with  MAI infection. He status post IR guided embolization for hemoptysis and end of 2016   Overall he has done well. However he says that at his job at [employer] they have been working hard and he is mostly fatigue with very closely. Recently been only sleeping 3 hours per day because of the work. Then in mid April 2018 he have one episode of hemoptysis while at work. With small amount that lasted few to several minutes. It did scare him but he decided to hold off against going to the emergency department. Since then he's not had any recurrence. He feels stressed and job stress is contributing to this. Most recent CT scan chest as in December 2017 that shows stability documented below. There are no other new issues. He is open to having a transplant referral.  Results for [name], [name] (MRN [id number]) as of 03/18/2017 09:41  Ref. Range 10/13/2015 12:40 10/14/2015 02:28 10/15/2015 04:44 10/20/2015 11:38 12/05/2015 12:16 01/16/2016 16:16 04/09/2016 09:43 10/15/2016 15:55  Eosinophils Absolute Latest Ref Range: 15 - 500 cells/uL   0.1  0.1 0.0 196       IMPRESSION: 1. Stable severe biapical bullous changes and nodular pleural parenchymal scarring.  2. No acute abnormality identified. No evidence of acute infiltrate. No evidence of aspergilloma noted on today's exam.   Electronically Signed   By: [place]   On: 04/09/2016 10:11  IMPRESSION: 1. Large bullae and upper lung cystic spaces, more thick wall on the left, but without dependent fluid or internal soft tissue masses. Specifically, no evidence of an aspergilloma. 2. There are numerous bilateral lung nodules most of which are calcified, consistent with healed fungal infection/ granulomatous disease. The largest nodule arises from the right lower lobe with an average size of 9 mm. 3. Tree-in-bud type opacities in the posterior medial left lung may reflect active atypical infection or be chronic. 4. There are no focal areas of  lung consolidation to suggest lobar pneumonia. No pulmonary edema. 5. No mediastinal or hilar masses or enlarged lymph nodes. 6. Chest CT findings are similar to the most recent prior chest radiographic findings suggesting that all of the lung abnormalities are chronic. Consider follow-up chest CT in 3 months, however, to allow a more accurate comparison for stability.   Electronically Signed   By: Lajean Manes M.D.   On: 10/24/2016 08:37     08/26/2017 Follow up : ABPA, MAC, Aspergilloma , Hemoptysis.  Patient presents for a post hospital follow-up. Patient was recently admitted for hemoptysis. Patient was transferred from [facility]. He was started on  Antibiotics with  Augmentin at discharge for a total 21 days. He has 3 days left of abx.  CT chest showed a large emphysematous bullae in both upper lobe. Interval development of a fluid density and several bullae in the left upper lobe concerning  for superimposed infection. He was started on BREO .  He is starting to feel a lot better. No further hemoptysis . Feels breathing is better on BREO .   Has hx of MAI and Aspergilloma .  Marland Kitchen Remains on Azithro/Ethambutol. And VFEND .  Follows with ID later this month.   Has ABPA on Prednisone 4m daily , BREO and Spiriva daily . Xolair  injection every 2 weeks.   He does not require oxygen.   [facility].   Says he works full time 60 to 80 hr a week . We discussed healthy lifestyle/sleep/nutrition.     OV 10/22/2017  Chief Complaint  Patient presents with  . Follow-up    Pt states that he has been doing good. Denies any complaints. Denies any cough, SOB, or CP.   41year old male with a VPA previous MI aspergilloma and obstructive lung disease.  Currently on prednisone, Xolair, Brio, Spiriva and antifungals through infectious diseases.  Most recently in September 2018 he had hemoptysis treated with  antibiotics and resolved.  It was attributed to pulmonary bleb formation and fluid in the blood.  He was having skin rash from his voriconazole and this is been changed to which she has been taking for the last few weeks.Crezemba he keeps his skin covered because of the rash.  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  Currently he is feeling stable.  COPD Score is 16 and showing that he handles his severe lung disease quite well.  There are no new issues.  He is up-to-date with his flu shot.   CAT COPD Symptom & Quality of Life Score (GSK trademark) 0 is no burden. 5 is highest burden 10/22/2017   Never Cough -> Cough all the time 3  No phlegm in chest -> Chest is full of phlegm 2  No chest tightness -> Chest feels very tight 2  No dyspnea for 1 flight stairs/hill -> Very dyspneic for 1 flight of stairs 3  No limitations for ADL at home -> Very limited with ADL at home 1  Confident leaving home -> Not at all confident leaving home 1  Sleep soundly -> Do not sleep soundly because of lung condition 2  Lots of Energy -> No energy at all 2  TOTAL Score (max 40)  16        has a past medical history of 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[place], Esophageal reflux, Hypothyroidism, Lung disease, bullous ([place], MAI (mycobacterium avium-intracellulare) ([place], Mold exposure (12/05/2015), Photosensitivity dermatitis (09/16/2017), Solar lentigo (08/08/2015), and Weight loss (10/15/2016).   reports that he quit smoking about 9 years ago. His smoking use included cigarettes. He has a 13.30 pack-year smoking history. He quit smokeless tobacco use about 22 years ago. His smokeless tobacco use included snuff.  Past Surgical History:  Procedure Laterality Date  . left VATS  2012   thoracotomy and LUL apical posterior segmentectomy  . VIDEO BRONCHOSCOPY Bilateral 10/20/2015   Procedure: VIDEO BRONCHOSCOPY WITHOUT FLUORO;   Surgeon: PMarshell Garfinkel MD;  [facility]  Service: Cardiopulmonary;  Laterality: Bilateral;    Allergies  Allergen Reactions  . Clarithromycin Other (See Comments)    Adverse reaction w/ voriconazole  . Rifaximin Other (See Comments)    Adverse reaction w/ voriconazole    Immunization History  Administered Date(s) Administered  . DTaP 11/19/2010  . Influenza Split 10/29/2011, 08/14/2012  . Influenza Whole 09/19/2010  . Influenza,inj,Quad PF,6+ Mos 11/23/2013, 09/28/2014, 09/06/2015, 08/27/2016, 08/10/2017  . Pneumococcal Conjugate-13 04/29/2017, 04/29/2017  . Pneumococcal Polysaccharide-23 12/17/2010    Family History  Adopted: Yes     Current Outpatient Medications:  .  albuterol (PROVENTIL HFA;VENTOLIN HFA) 108 (90 Base) MCG/ACT inhaler, Inhale 2 puffs into the lungs every 6 (six) hours as needed. (Patient taking differently: Inhale 2 puffs into the lungs every 6 (six) hours as needed for wheezing or shortness of breath. ), Disp: 1 Inhaler, Rfl: 4 .  azithromycin (ZITHROMAX) 500 MG tablet, Take 500 mg by mouth daily., Disp: , Rfl: 1 .  Calcium Carbonate-Vitamin D 600-400 MG-UNIT tablet, Take by mouth., Disp: , Rfl:  .  CRESEMBA 186 MG CAPS, , Disp: , Rfl: 0 .  dextromethorphan-guaiFENesin (TUSSIN DM) 10-100 MG/5ML liquid, Take 10 mLs by mouth every 4 (four) hours as needed for cough., Disp: 118 mL, Rfl: 0 .  EPINEPHrine 0.3 mg/0.3 mL IJ SOAJ injection, Inject 0.3 mLs (0.3 mg total) into the muscle once., Disp: 1 Device, Rfl: 11 .  ethambutol (MYAMBUTOL) 400 MG tablet, TAKE TWO AND ONE-HALF TABLETS BY MOUTH ONCE DAILY (Patient taking differently: TAKE 1000 MG TABLETS BY MOUTH ONCE DAILY), Disp: 225 tablet, Rfl: 5 .  ferrous sulfate (FERRO-BOB) 325 (65 FE) MG tablet, Take 325 mg by mouth daily.  , Disp: , Rfl:  .  fluticasone furoate-vilanterol (BREO ELLIPTA) 100-25 MCG/INH AEPB, Inhale 1 puff into the lungs daily., Disp: 1 each, Rfl: 5 .  levothyroxine (SYNTHROID,  LEVOTHROID) 75 MCG tablet, Take 75 mcg by mouth daily. , Disp: , Rfl: 0 .  predniSONE (DELTASONE) 5 MG tablet, Take 5 mg by mouth daily with breakfast., Disp: , Rfl:  .  SPIRIVA HANDIHALER 18 MCG inhalation capsule, PLACE 1 CAPSULE INTO INHALER AND INHALE DAILY, Disp: 30 capsule, Rfl: 1 .  XOLAIR 150 MG injection, INJECT 375MG SUBCUTANEOUSLY EVERY 2 WEEKS., Disp: 6 each, Rfl: 5  Current Facility-Administered Medications:  .  omalizumab (XOLAIR) injection 375 mg, 375 mg, Subcutaneous, Q14 Days, Lus Kriegel, MD, 375 mg at 09/23/17 0901 .  omalizumab Arvid Right) injection 375 mg, 375 mg, Subcutaneous, Q14 Days, Jovane Foutz, MD, 375 mg at 10/07/17 1102   Review of Systems     Objective:   Physical Exam  Constitutional: He is oriented to person, place, and time. He appears well-developed. No distress.  lean  HENT:  Head: Normocephalic and atraumatic.  Right Ear: External ear normal.  Left Ear: External ear normal.  Mouth/Throat: Oropharynx is clear and moist. No oropharyngeal exudate.  Eyes: Conjunctivae and EOM are normal. Pupils are equal, round, and reactive to light. Right eye exhibits no discharge. Left eye exhibits no discharge. No scleral icterus.  Neck: Normal range of motion. Neck supple. No JVD present. No tracheal deviation present. No thyromegaly present.  Cardiovascular: Normal rate, regular rhythm and intact distal pulses. Exam reveals no gallop and no friction rub.  No murmur heard. Pulmonary/Chest: Effort normal and breath sounds normal. No respiratory distress. He has no wheezes. He has no rales. He exhibits no tenderness.  Barrell; ches tmild  Abdominal: Soft. Bowel sounds are normal. He exhibits no distension and no mass. There is no tenderness. There is no rebound and no guarding.  Musculoskeletal: Normal range of motion. He exhibits no edema or tenderness.  Lymphadenopathy:    He has no cervical adenopathy.  Neurological: He is  alert and oriented to person,  place, and time. He has normal reflexes. No cranial nerve deficit. Coordination normal.  Skin: Skin is warm and dry. No rash noted. He is not diaphoretic. No erythema. No pallor.  Skin rash due to vfend - allover  Psychiatric: He has a normal mood and affect. His behavior is normal. Judgment and thought content normal.  Nursing note and vitals reviewed.  Vitals:   10/22/17 0941  BP: 108/74  Pulse: 77  SpO2: 100%  Weight: 124 lb 6.4 oz (56.4 kg)  Height: 5' 9" (1.753 m)    Estimated body mass index is 18.37 kg/m as calculated from the following:   Height as of this encounter: 5' 9" (1.753 m).   Weight as of this encounter: 124 lb 6.4 oz (56.4 kg).     Assessment:       ICD-10-CM   1. Severe persistent asthma without complication V88.67   2. 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[place]) B44.9   3. ABPA (allergic bronchopulmonary aspergillosis) ([place]) B44.81        Plan:      Stable disease and glad better with hemoptysis  Plan Continue prednisone, xolair, breo and spiriva schedule; but change spiriva to respimat format Albuterol as needed Continue ID crezemba per Dr Tommy Medal Cut down work load to 40h/week  Followup 3 months or soonerif needed   Dr. Brand Males, M.D., [facility].C.P Pulmonary and Critical Care Medicine Staff Physician, [place] Director - Interstitial Lung Disease  Program  Pulmonary [place] at [place], [place], [address]  Pager: [phone], If no answer or between  15:00h - 7:00h: call [phone] Telephone: [phone]

## 2017-10-31 ENCOUNTER — Telehealth: Payer: Self-pay | Admitting: Internal Medicine

## 2017-10-31 NOTE — Telephone Encounter (Signed)
#  vials:6 Ordered date:10/31/17 Shipping Date:10/31/17

## 2017-11-01 DIAGNOSIS — A310 Pulmonary mycobacterial infection: Secondary | ICD-10-CM | POA: Diagnosis not present

## 2017-11-01 DIAGNOSIS — B449 Aspergillosis, unspecified: Secondary | ICD-10-CM | POA: Diagnosis not present

## 2017-11-01 DIAGNOSIS — J4540 Moderate persistent asthma, uncomplicated: Secondary | ICD-10-CM | POA: Diagnosis not present

## 2017-11-01 DIAGNOSIS — B4481 Allergic bronchopulmonary aspergillosis: Secondary | ICD-10-CM | POA: Diagnosis not present

## 2017-11-01 NOTE — Telephone Encounter (Signed)
[name] - [facility] calling to schedule xolair delivery for the patient.  CB is [phone].

## 2017-11-01 NOTE — Telephone Encounter (Signed)
Pt calling back about if he need to come in on Monday [phone]

## 2017-11-01 NOTE — Telephone Encounter (Signed)
As you see from the order his Arvid Right was supposed to come in today. [facility] called and left a message ,I called them back. Thay said it was too soon to order. This is about the 3rd or 4th time this has happened. Trying to speak to a supervisor, I've been on hold for a total of 43 mins.. (2 ph. Calls) Spoke with Joellen Jersey about this she said to go ahead and give him samples. Sinice I'm not here Mon. Their clearly marked for [place]. Tried pt. Twice left 2 messages, will try again. I left another message letting him know he can keep his appt.. Nothing further needed.

## 2017-11-04 ENCOUNTER — Ambulatory Visit (INDEPENDENT_AMBULATORY_CARE_PROVIDER_SITE_OTHER): Payer: BLUE CROSS/BLUE SHIELD

## 2017-11-04 DIAGNOSIS — J4540 Moderate persistent asthma, uncomplicated: Secondary | ICD-10-CM | POA: Diagnosis not present

## 2017-11-05 MED ORDER — OMALIZUMAB 150 MG ~~LOC~~ SOLR
375.0000 mg | SUBCUTANEOUS | Status: DC
  Administered 2017-11-04: 375 mg via SUBCUTANEOUS

## 2017-11-05 NOTE — Telephone Encounter (Signed)
#  Vials:6 Arrival Date:11/05/17  Lot #:[id number] Exp Date:02/17/21

## 2017-11-13 ENCOUNTER — Other Ambulatory Visit: Payer: Self-pay | Admitting: Infectious Disease

## 2017-11-13 DIAGNOSIS — A310 Pulmonary mycobacterial infection: Secondary | ICD-10-CM

## 2017-11-18 ENCOUNTER — Ambulatory Visit (INDEPENDENT_AMBULATORY_CARE_PROVIDER_SITE_OTHER): Payer: BLUE CROSS/BLUE SHIELD

## 2017-11-18 DIAGNOSIS — J4550 Severe persistent asthma, uncomplicated: Secondary | ICD-10-CM | POA: Diagnosis not present

## 2017-11-19 DIAGNOSIS — A310 Pulmonary mycobacterial infection: Secondary | ICD-10-CM | POA: Diagnosis not present

## 2017-11-19 DIAGNOSIS — B4481 Allergic bronchopulmonary aspergillosis: Secondary | ICD-10-CM | POA: Diagnosis not present

## 2017-11-19 DIAGNOSIS — J4540 Moderate persistent asthma, uncomplicated: Secondary | ICD-10-CM | POA: Diagnosis not present

## 2017-11-19 DIAGNOSIS — B449 Aspergillosis, unspecified: Secondary | ICD-10-CM | POA: Diagnosis not present

## 2017-11-26 MED ORDER — OMALIZUMAB 150 MG ~~LOC~~ SOLR
375.0000 mg | Freq: Once | SUBCUTANEOUS | Status: AC
Start: 2017-11-18 — End: 2017-11-18
  Administered 2017-11-18: 375 mg via SUBCUTANEOUS

## 2017-12-02 ENCOUNTER — Ambulatory Visit (INDEPENDENT_AMBULATORY_CARE_PROVIDER_SITE_OTHER): Payer: BLUE CROSS/BLUE SHIELD

## 2017-12-02 DIAGNOSIS — J4540 Moderate persistent asthma, uncomplicated: Secondary | ICD-10-CM

## 2017-12-05 ENCOUNTER — Telehealth: Payer: Self-pay | Admitting: Internal Medicine

## 2017-12-05 MED ORDER — OMALIZUMAB 150 MG ~~LOC~~ SOLR
375.0000 mg | SUBCUTANEOUS | Status: DC
  Administered 2017-12-02: 375 mg via SUBCUTANEOUS

## 2017-12-05 NOTE — Progress Notes (Signed)
Xolair injection documentation and charges entered by Maury Dus, RMA, based on injection sheet filled out by Alroy Bailiff per office protocol.

## 2017-12-05 NOTE — Telephone Encounter (Signed)
#  vials:6 Ordered date:12/05/17 Shipping Date:12/11/17 this is a teniative date. Pt. Med. [facility].

## 2017-12-11 DIAGNOSIS — B4481 Allergic bronchopulmonary aspergillosis: Secondary | ICD-10-CM | POA: Diagnosis not present

## 2017-12-11 DIAGNOSIS — B449 Aspergillosis, unspecified: Secondary | ICD-10-CM | POA: Diagnosis not present

## 2017-12-11 DIAGNOSIS — J4540 Moderate persistent asthma, uncomplicated: Secondary | ICD-10-CM | POA: Diagnosis not present

## 2017-12-11 DIAGNOSIS — A310 Pulmonary mycobacterial infection: Secondary | ICD-10-CM | POA: Diagnosis not present

## 2017-12-11 DIAGNOSIS — E039 Hypothyroidism, unspecified: Secondary | ICD-10-CM | POA: Diagnosis not present

## 2017-12-12 NOTE — Telephone Encounter (Signed)
#  Vials:6 Arrival Date:12/12/17 Lot #:[id number] Exp Date:03/2021

## 2017-12-16 ENCOUNTER — Ambulatory Visit (INDEPENDENT_AMBULATORY_CARE_PROVIDER_SITE_OTHER): Payer: BLUE CROSS/BLUE SHIELD

## 2017-12-16 ENCOUNTER — Ambulatory Visit: Payer: BLUE CROSS/BLUE SHIELD | Admitting: Infectious Disease

## 2017-12-16 ENCOUNTER — Encounter: Payer: Self-pay | Admitting: Infectious Disease

## 2017-12-16 VITALS — BP 130/89 | HR 70 | Temp 98.1°F | Ht 69.0 in | Wt 125.0 lb

## 2017-12-16 DIAGNOSIS — J4550 Severe persistent asthma, uncomplicated: Secondary | ICD-10-CM | POA: Diagnosis not present

## 2017-12-16 DIAGNOSIS — B449 Aspergillosis, unspecified: Secondary | ICD-10-CM

## 2017-12-16 DIAGNOSIS — B4481 Allergic bronchopulmonary aspergillosis: Secondary | ICD-10-CM | POA: Diagnosis not present

## 2017-12-16 DIAGNOSIS — L270 Generalized skin eruption due to drugs and medicaments taken internally: Secondary | ICD-10-CM | POA: Diagnosis not present

## 2017-12-16 DIAGNOSIS — L814 Other melanin hyperpigmentation: Secondary | ICD-10-CM

## 2017-12-16 DIAGNOSIS — A310 Pulmonary mycobacterial infection: Secondary | ICD-10-CM | POA: Diagnosis not present

## 2017-12-16 HISTORY — DX: Generalized skin eruption due to drugs and medicaments taken internally: L27.0

## 2017-12-16 NOTE — Progress Notes (Signed)
Chief complaint: admission with hemoptysis  Subjective:  Chief complaint:  : Patient ID: [name], male    DOB: 04-22-1976, 42 y.o.   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 We had taken him off his Mycobacterium avium drugs and then he developed fevers chills and malaise  Spring (2013) and was seen by my partner Dr. Megan Salon in late April  His AFB smears and cultures obtained in the clinic on that day prior to the initiation of azithromycin ethambutol ultimately failed to grow Mycobacterium avium. He had been doing well I saw him in [place] 2013 we discontinued his azithromycin and ethambutol again. Unfortunately he again shortly thereafter developed fevers cough malaise. He started back on azithromycin ethambutol. Cultures done for AFB here were again negative. The patient however felt dramatically better having been back on azithromycin and ethambutol.   I last saw him in January of 2015 when he was feeling relatively well.  He did have an episode of coughing up a few flecks of blood in January 2016 which prompted appt with [facility] but by time he made appt this had long since resolved. Was only a one time episode none since then.  I saw again in March and then in September 2016. In summer he was hospitalized with hemoptysis and found to have an area in the upper lobe consistent with an aspergilloma. He underwent bronchoscopy with BAL and cultures ultimately yielded a Saccharomyces species on fungal culture. His serum galactomannan was negative.  He has been  on voriconazole and axial has had radiographic improvement and his x-ray findings. He also feelt etter symptomatically with no  more hemoptysis improvement in his cough improvement in his energy though he still feels not nearly as well as he did prior to his hospitalization. He is avoiding exercised during the winter as it often precipitates coughing spells. He was  only taking 200 mg twice a day of voriconazole rather than the 300 mg he has continued this along with his ethambutol and azithromycin. He continued to have cough more so with exercise and has not been exercising at "[employer]" due to the fact this elicits more coughing. He has not smoked since 2007. He is seeing [name] and is being considered for new drug infusion to treat his COPD.  He did have 20# weight loss that he attributed to increased exercise.  He then saw  [name] after an episode of hematemesis. 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 He said he only coughed up a small amount of blood material and did not go to the ER but was seen and followed by pulmonary. Had a chest x-ray which showed no new findings.  He was going to be evaluated by the transplant team at [facility].  He did complain of hyperpigmented spots on sun exposed areas on his body including arms and neck which his dermatologist have told him is due to his voriconazole use.   We considered change at that time  Since that time he has [name] been admitted with hemoptysis at [facility].   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 There is interval development of  fluid density in several bulla in the left upper lobe concerning for superimposed infection, possibly fungal in origin.  Stable bilateral calcified pulmonary nodules are noted consistent with prior granulomatous disease.  Stable tree in bud appearance is noted medially in the left lung posteriorly consistent with sequela of previous atypical infection.  Sun exposed skin rash persists worse in sun and he tries to cover arms with  long sleeved shirts.   Due to his skin toxicity we decided to change him to [facility] and he is on that now and tolerating without problems.  His cough is at baseline, no better and now worse.    Past Medical History:  Diagnosis Date  . 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[place])   . Esophageal reflux   . 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 Lung disease, bullous ([place])   . MAI (mycobacterium avium-intracellulare) ([place])   . Mold exposure 12/05/2015  . 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 Solar lentigo 08/08/2015  . Weight loss 10/15/2016   Past Surgical History:  Procedure Laterality Date  . left VATS  2012   thoracotomy and LUL apical posterior segmentectomy  . VIDEO BRONCHOSCOPY Bilateral 10/20/2015   Procedure: VIDEO BRONCHOSCOPY WITHOUT FLUORO;  Surgeon: Marshell Garfinkel, MD;  Location: [place];  Service: Cardiopulmonary;  Laterality: Bilateral;   Family History  Adopted: Yes   Social History   Tobacco Use  . Smoking status: Former Smoker    Packs/day: 0.70    Years: 19.00    Pack years: 13.30    Types: Cigarettes    Last attempt to quit: 11/20/2007    Years since quitting: 10.0  . Smokeless tobacco: Former Systems developer    Types: Snuff    Quit date: 11/19/1994  Substance Use Topics  . Alcohol use: No  . Drug use: No    Allergies  Allergen Reactions  . Clarithromycin Other (See Comments)    Adverse reaction w/ voriconazole  . Rifaximin Other (See Comments)    Adverse reaction w/ voriconazole    Current Outpatient Medications:  .  albuterol (PROVENTIL HFA;VENTOLIN HFA) 108 (90 Base) MCG/ACT inhaler, Inhale 2 puffs into the lungs every 6 (six) hours as needed. (Patient taking differently: Inhale 2 puffs into the lungs every 6 (six) hours as needed for wheezing or shortness of breath. ), Disp: 1 Inhaler, Rfl: 4 .  azithromycin (ZITHROMAX) 500 MG tablet, Take 500 mg by mouth daily., Disp: , Rfl: 1 .  azithromycin (ZITHROMAX) 500 MG tablet, TAKE 1 TABLET BY MOUTH ONCE  DAILY, Disp: 90 tablet, Rfl: 1 .  Calcium Carbonate-Vitamin D 600-400 MG-UNIT tablet, Take by mouth., Disp: , Rfl:  .  CRESEMBA 186 MG CAPS, , Disp: , Rfl: 0 .  dextromethorphan-guaiFENesin (TUSSIN DM) 10-100 MG/5ML liquid, Take 10 mLs by mouth every 4 (four) hours as needed for cough., Disp: 118 mL, Rfl: 0 .  EPINEPHrine 0.3 mg/0.3 mL IJ SOAJ injection, Inject 0.3 mLs (0.3 mg total) into the muscle once., Disp: 1 Device, Rfl: 11 .  ethambutol (MYAMBUTOL) 400 MG tablet, TAKE TWO AND ONE-HALF TABLETS BY MOUTH ONCE DAILY (Patient taking differently: TAKE 1000 MG TABLETS BY MOUTH ONCE DAILY), Disp: 225 tablet, Rfl: 5 .  ferrous sulfate (FERRO-BOB) 325 (65 FE) MG tablet, Take 325 mg by mouth daily.  , Disp: , Rfl:  .  fluticasone furoate-vilanterol (BREO ELLIPTA) 100-25 MCG/INH AEPB, Inhale 1 puff into the lungs daily., Disp: 1 each, Rfl: 5 .  levothyroxine (SYNTHROID, LEVOTHROID) 75 MCG tablet, Take 75 mcg  by mouth daily. , Disp: , Rfl: 0 .  predniSONE (DELTASONE) 5 MG tablet, Take 5 mg by mouth daily with breakfast., Disp: , Rfl:  .  Tiotropium Bromide Monohydrate (SPIRIVA RESPIMAT) 2.5 MCG/ACT AERS, Inhale 2 puffs into the lungs daily., Disp: 1 Inhaler, Rfl: 5 .  XOLAIR 150 MG injection, INJECT 375MG SUBCUTANEOUSLY EVERY 2 WEEKS., Disp: 6 each, Rfl: 5  Current Facility-Administered Medications:  .  omalizumab (XOLAIR) injection 375 mg, 375 mg, Subcutaneous, Q14 Days, Ramaswamy, Murali, MD, 375 mg at 09/23/17 0901 .  omalizumab Arvid Right) injection 375 mg, 375 mg, Subcutaneous, Q14 Days, Ramaswamy, Belva Crome, MD, 375 mg at 10/21/17 1237 .  omalizumab Arvid Right) injection 375 mg, 375 mg, Subcutaneous, Once, Brand Males, MD .  omalizumab Arvid Right) injection 375 mg, 375 mg, Subcutaneous, Q14 Days, Brand Males, MD, 375 mg at 11/04/17 0854 .  omalizumab Arvid Right) injection 375 mg, 375 mg, Subcutaneous, Q14 Days, Ramaswamy, Murali, MD, 375 mg at 12/02/17 1107    Review of Systems  Constitutional:  Negative for activity change, appetite change, chills, diaphoresis, fatigue, fever and unexpected weight change.  HENT: Negative for congestion, rhinorrhea, sinus pressure, sneezing, sore throat and trouble swallowing.   Eyes: Negative for photophobia and visual disturbance.  Respiratory: Positive for cough and shortness of breath. Negative for chest tightness, wheezing and stridor.   Cardiovascular: Negative for chest pain, palpitations and leg swelling.  Gastrointestinal: Negative for abdominal distention, abdominal pain, anal bleeding, blood in stool, constipation, diarrhea, nausea and vomiting.  Genitourinary: Negative for difficulty urinating, dysuria, flank pain and hematuria.  Musculoskeletal: Negative for arthralgias, back pain, gait problem, joint swelling and myalgias.  Skin: Positive for rash. Negative for color change, pallor and wound.  Neurological: Negative for dizziness, tremors, weakness and light-headedness.  Hematological: Negative for adenopathy. Does not bruise/bleed easily.  Psychiatric/Behavioral: Negative for agitation, behavioral problems, confusion, decreased concentration, dysphoric mood and sleep disturbance.       Objective:   Physical Exam  Constitutional: He is oriented to person, place, and time. He appears well-nourished. No distress.  HENT:  Head: Normocephalic and atraumatic.  Eyes: Conjunctivae and EOM are normal. Pupils are equal, round, and reactive to light. No scleral icterus.  Neck: Normal range of motion. Neck supple.  Cardiovascular: Normal rate, regular rhythm and normal heart sounds. Exam reveals no gallop and no friction rub.  No murmur heard. Pulmonary/Chest: No respiratory distress. He has decreased breath sounds in the right lower field and the left lower field.  Prolonged expiratory phase  Abdominal: He exhibits no distension. There is no tenderness. There is no rebound.  Musculoskeletal: He exhibits no edema or tenderness.    Lymphadenopathy:    He has no cervical adenopathy.  Neurological: He is alert and oriented to person, place, and time. He exhibits normal muscle tone. Coordination normal.  Skin: Skin is warm and dry. Rash noted. He is not diaphoretic. No erythema. No pallor.     Psychiatric: He has a normal mood and affect. His behavior is normal. Judgment and thought content normal.    Hyperpigmented area on neck C picture 04/08/2017:     Rash today is stable 12/16/17     Assessment & Plan:   Aspergilloma: continue lifelong antifungal therapy: currently on CRESEMBA. Greatly appreciate Cassie's help here  Allergic bronchopulmonary aspgergillosis: As above, I would be more comfortable with thim being on long standing antifungal therapy. He is still on steroids as well  Sun exposed rash from voriconozole we have changed azoles. He is not  being exposed to sun much at present. Hopefully this makes a difference  Mycobacterium avium pulmonary infection: We have been reluctant to stop these medications due to prior flares when he comes off the medication indications.

## 2017-12-17 LAB — COMPLETE METABOLIC PANEL WITH GFR
AG Ratio: 1.5 (calc) (ref 1.0–2.5)
ALKALINE PHOSPHATASE (APISO): 75 U/L (ref 40–115)
ALT: 12 U/L (ref 9–46)
AST: 19 U/L (ref 10–40)
Albumin: 4 g/dL (ref 3.6–5.1)
BILIRUBIN TOTAL: 0.4 mg/dL (ref 0.2–1.2)
BUN: 10 mg/dL (ref 7–25)
CHLORIDE: 100 mmol/L (ref 98–110)
CO2: 33 mmol/L — ABNORMAL HIGH (ref 20–32)
Calcium: 9.3 mg/dL (ref 8.6–10.3)
Creat: 0.87 mg/dL (ref 0.60–1.35)
GFR, Est African American: 123 mL/min/{1.73_m2} (ref >=60)
GFR, Est Non African American: 106 mL/min/{1.73_m2} (ref >=60)
GLUCOSE: 90 mg/dL (ref 65–99)
Globulin: 2.7 g/dL (calc) (ref 1.9–3.7)
Potassium: 4.1 mmol/L (ref 3.5–5.3)
Sodium: 140 mmol/L (ref 135–146)
TOTAL PROTEIN: 6.7 g/dL (ref 6.1–8.1)

## 2017-12-17 MED ORDER — OMALIZUMAB 150 MG ~~LOC~~ SOLR
375.0000 mg | Freq: Once | SUBCUTANEOUS | Status: AC
Start: 2017-12-16 — End: 2017-12-16
  Administered 2017-12-16: 375 mg via SUBCUTANEOUS

## 2017-12-30 ENCOUNTER — Ambulatory Visit (INDEPENDENT_AMBULATORY_CARE_PROVIDER_SITE_OTHER): Payer: BLUE CROSS/BLUE SHIELD

## 2017-12-30 DIAGNOSIS — J4550 Severe persistent asthma, uncomplicated: Secondary | ICD-10-CM | POA: Diagnosis not present

## 2017-12-31 MED ORDER — OMALIZUMAB 150 MG ~~LOC~~ SOLR
375.0000 mg | Freq: Once | SUBCUTANEOUS | Status: AC
Start: 2017-12-30 — End: 2017-12-30
  Administered 2017-12-30: 375 mg via SUBCUTANEOUS

## 2018-01-06 DIAGNOSIS — E039 Hypothyroidism, unspecified: Secondary | ICD-10-CM | POA: Diagnosis not present

## 2018-01-10 ENCOUNTER — Other Ambulatory Visit: Payer: Self-pay | Admitting: Internal Medicine

## 2018-01-10 ENCOUNTER — Telehealth: Payer: Self-pay | Admitting: Internal Medicine

## 2018-01-10 MED ORDER — PREDNISONE 5 MG PO TABS: 5.0000 mg | ORAL_TABLET | Freq: Every day | ORAL | 1 refills | Status: DC

## 2018-01-10 NOTE — Telephone Encounter (Signed)
Called and spoke with pt verifying pt's pharmacy.  Rx prednisone sent to pt's  Preferred pharmacy.  Nothing further needed at this current time.

## 2018-01-13 ENCOUNTER — Ambulatory Visit: Payer: Self-pay

## 2018-01-13 ENCOUNTER — Telehealth: Payer: Self-pay | Admitting: *Deleted

## 2018-01-13 ENCOUNTER — Ambulatory Visit (INDEPENDENT_AMBULATORY_CARE_PROVIDER_SITE_OTHER): Payer: BLUE CROSS/BLUE SHIELD

## 2018-01-13 DIAGNOSIS — J4550 Severe persistent asthma, uncomplicated: Secondary | ICD-10-CM

## 2018-01-13 DIAGNOSIS — B449 Aspergillosis, unspecified: Secondary | ICD-10-CM | POA: Diagnosis not present

## 2018-01-13 DIAGNOSIS — A310 Pulmonary mycobacterial infection: Secondary | ICD-10-CM | POA: Diagnosis not present

## 2018-01-13 DIAGNOSIS — B4481 Allergic bronchopulmonary aspergillosis: Secondary | ICD-10-CM | POA: Diagnosis not present

## 2018-01-13 DIAGNOSIS — J4540 Moderate persistent asthma, uncomplicated: Secondary | ICD-10-CM | POA: Diagnosis not present

## 2018-01-13 NOTE — Telephone Encounter (Signed)
#  vials:6 Ordered date:01/13/2018 Shipping Date:01/13/2018 If ins. Is verifyed, (major medical)

## 2018-01-14 MED ORDER — OMALIZUMAB 150 MG ~~LOC~~ SOLR
375.0000 mg | Freq: Once | SUBCUTANEOUS | Status: AC
Start: 2018-01-13 — End: 2018-01-13
  Administered 2018-01-13: 375 mg via SUBCUTANEOUS

## 2018-01-14 NOTE — Telephone Encounter (Signed)
#  Vials:6 Arrival Date:01/14/2018 Lot #:[id number] Exp Date:03/2021

## 2018-01-27 ENCOUNTER — Ambulatory Visit: Payer: BLUE CROSS/BLUE SHIELD | Admitting: Internal Medicine

## 2018-01-27 ENCOUNTER — Ambulatory Visit (INDEPENDENT_AMBULATORY_CARE_PROVIDER_SITE_OTHER): Payer: BLUE CROSS/BLUE SHIELD

## 2018-01-27 ENCOUNTER — Encounter: Payer: Self-pay | Admitting: Internal Medicine

## 2018-01-27 VITALS — BP 118/72 | HR 62 | Ht 69.0 in | Wt 121.0 lb

## 2018-01-27 DIAGNOSIS — B4481 Allergic bronchopulmonary aspergillosis: Secondary | ICD-10-CM | POA: Diagnosis not present

## 2018-01-27 DIAGNOSIS — J82 Pulmonary eosinophilia, not elsewhere classified: Secondary | ICD-10-CM | POA: Diagnosis not present

## 2018-01-27 DIAGNOSIS — J8283 Eosinophilic asthma: Secondary | ICD-10-CM

## 2018-01-27 DIAGNOSIS — J4550 Severe persistent asthma, uncomplicated: Secondary | ICD-10-CM | POA: Diagnosis not present

## 2018-01-27 LAB — POCT EXHALED NITRIC OXIDE: FeNO level (ppb): 7

## 2018-01-27 NOTE — Progress Notes (Signed)
Subjective:     Patient ID: [name], male   DOB: 07-Oct-1976, 42 y.o.   MRN: [id number]  PCP Wallene Dales, MD  HPI    male never smoker followed for ABPA , MAC and Aspergilloma .  Patient has a history of aspergilloma , status post VATS in 2012. 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 Followed at [facility] for MAC .    TEST  1.7/13 Ig E 800s 12/10/11 Sputum AFB  Smear and culture negative (prelim) 12/10/11 Fungal sputum culture - negative (final) 01/30/2012 spirometry - fev1 1.7L/41%, ratip 52 - BEST EVER 01/30/2012 walking desaturation test: 185 feet x 3 laps: did not deaturate 05/2014 >Spiro fev1 1.7L/40%, ratio 55  Spirometry 06/25/2017 showed FEV1 at 28%, ratio 46, FVC 50%.  Events  2010 Dx with MAI >Azithro/Ethambutol 2012 Dx with Aspergilloma >VATS >started on Voriconazaol  2012 Dx w/ ABPA w/ High IgE >4000>started on prednisone  2013 tried off azithr/Etham but developed fever, Cx neg but restarted on rx.  2016 Hospitalzed with hemoptysis >IR guided embolization . FOB/BAL + Saccharomyces on fungal fx . Serum Galactomannan was neg.  [name] evaluation 04/2017 for Transplant -rejected. Felt to early for transplant - recommended pre-transplant weight goal 128 lbs for a BMI > 19          HPI   OV 01/16/2016  Chief Complaint  Patient presents with  . Follow-up    Pt denies any increased dyspnea at this time - states that it comes and goes with activity. Pt states it is different on a daily basis. Denies hemoptysis since last OV.   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 Since then he overall feels that his dyspnea is not back to baseline. He feels he has had a new lower baseline. He has more fatigued than usual. But he is stable without any exacerbation. He feels in the next few years ago started to become permanently disabled.  He currently works as a cBiomedical scientist There are no fevers.   Last chest x-ray 11/07/2015: Shows hyperinflation personally visualized Last IgE January 2013 was 841 and significantly improved from 4000 2012.  Last CBC January 2017 with normal used to flows are 100 cells per cubic millimeter.   OV 08/27/2016  Chief Complaint  Patient presents with  . Follow-up    Pt states his cough and SOB is at baseline. Pt states he has hemoptysis around once a week at most - penny size of mucus. Pt denies CP/tightness and f/c/s.      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 He status post IR guided embolization of hemoptysis end 2016.   Last visit feb 2017 he was reporting worsening dyspnea. We checked his IgE. Improved significantly but it was still in the 700s. We started Xolair therapy. He's been doing this now for a few months. He says this has helped his dyspnea symptoms significantly. However at end of 2 weeks he starts getting more symptomatic. Overall he is stable. 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 There are no fevers. Here, function test today FEV1 is 1.65 L/42% postbronchodilator. This is similar to 2014 but improved compared to 2015. Off note he tells me that he is mild hemoptysis once a month this is stable. He says this is  actually been going on since November 2016 when he had his embolization. This no change in this. He knows that he has to monitor his hemoptysis volume. He will have his flu shot today  Last chest x-ray 11/07/2015: Shows hyperinflation personally visualized Last IgE FEb 2017 was 717 and started xolair (January 2013 was 841 and significantly improved from 4000 in  2012)   OV 03/18/2017  Chief Complaint  Patient presents with  . Follow-up    6 mo f/u. Breathing has been the same since the last visit. Denies any increased SOB or chest pains.    Follow-up severe persistent asthma  with ABPA and aspergilloma with MAI infection. He status post IR guided embolization for hemoptysis and end of 2016   Overall he has done well. However he says that at his job at [employer] they have been working hard and he is mostly fatigue with very closely. Recently been only sleeping 3 hours per day because of the work. Then in mid April 2018 he have one episode of hemoptysis while at work. With small amount that lasted few to several minutes. It did scare him but he decided to hold off against going to the emergency department. Since then he's not had any recurrence. He feels stressed and job stress is contributing to this. Most recent CT scan chest as in December 2017 that shows stability documented below. There are no other new issues. He is open to having a transplant referral.   OV 01/27/2018  Chief Complaint  Patient presents with  . Follow-up    Pt states he has been doing good. Denies any complaints or concerns.     Follow-up severe persistent asthma with ABPA on prednisone 76m daily, spiriva, breo and Aspergilloma with MAI infection. He status post IR guided embolization of hemoptysis end 2016 and on Xolair since early 2017   Overall doing well. 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 Symptoms score is low ; 12 CAT score. Compliant with Rx. No hemoptysis. Reviewed prior labs - he says on daily prednisone since 2012. And prior to that has had significant eosinophilia and also break through eos since then (see below). Unable to come off prednisone completely. XKathreen Devoidhas helped but not fully. 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 5 is highest burden 01/27/2018   Never Cough -> Cough all the time 3  No phlegm in chest -> Chest is full of phlegm 2  No chest tightness -> Chest feels very  tight 1  No dyspnea for 1 flight stairs/hill -> Very dyspneic for 1 flight of stairs 3  No limitations for ADL at home -> Very limited with ADL at home 1  Confident leaving home -> Not at all confident leaving home 0  Sleep soundly -> Do not sleep soundly because of lung condition 1  Lots of Energy -> No energy at all 1  TOTAL Score (max 40)  12    Results for CBRUCE, [name](MRN [id number] as of 01/27/2018 09:12  Ref. Range 08/06/2016 09:09 06/25/2017 09:11  FEV1-Pre Latest Units: L 1.50 1.16  FEV1-%Pred-Pre Latest Units: % 39 28  Pre FEV1/FVC ratio Latest Units: % 53 46     Results for CSADIK, PIASCIK(MRN [id number] as of 01/27/2018 09:12  Ref. Range 12/16/2010 18:25  12/17/2010 03:45 12/17/2010 12:38 12/18/2010 04:25 12/19/2010 04:00 12/20/2010 04:04 12/21/2010 03:37 12/22/2010 06:42 12/22/2010 15:22 12/23/2010 04:22 12/24/2010 03:30 12/25/2010 04:19 12/27/2010 04:00 03/05/2011 18:15 03/06/2011 09:08 03/07/2011 09:18 03/09/2011 05:10 03/11/2011 04:54 04/02/2011 09:41 05/28/2011 09:42 11/26/2011 17:14 12/10/2011 10:21 12/10/2011 10:21 03/13/2012 16:54 09/22/2012 10:46 11/23/2013 09:46 07/05/2014 09:58 02/16/2015 10:16 08/08/2015 10:04 10/13/2015 04:00 10/13/2015 04:20 10/14/2015 02:28 10/15/2015 04:44 12/05/2015 12:16 01/16/2016 16:16 04/09/2016 09:43 08/09/2017 10:48 08/10/2017 10:46  Eosinophils Absolute Latest Ref Range: 0.0 - 0.7 K/uL 0.3 0.1  0.4          0.0 0.0 0.1   0.1 0.0 0.0    0.1 0.1 0.1 0.1 0.0 0.1   0.1 0.1 0.0 196 0.0 0.0   Results for [name], [name] (MRN [id number]) as of 01/27/2018 09:12  Ref. Range 03/07/2011 09:18 03/08/2011 16:23 11/26/2011 17:14 01/16/2016 16:16  IgE (Immunoglobulin E), Serum Latest Ref Range: <115 kU/L 4185.2 Result con... (H) 4744.6.Marland Kitchen. (H) 841.8 (H) 717 (H)      has a past medical history of 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[place]), Drug rash (12/16/2017), Esophageal reflux, Hypothyroidism, Lung disease, bullous ([place]), MAI (mycobacterium avium-intracellulare) ([place]), Mold exposure (12/05/2015), Photosensitivity dermatitis  (09/16/2017), Solar lentigo (08/08/2015), and Weight loss (10/15/2016).   reports that he quit smoking about 10 years ago. His smoking use included cigarettes. He has a 13.30 pack-year smoking history. He quit smokeless tobacco use about 23 years ago. His smokeless tobacco use included snuff.  Past Surgical History:  Procedure Laterality Date  . left VATS  2012   thoracotomy and LUL apical posterior segmentectomy  . VIDEO BRONCHOSCOPY Bilateral 10/20/2015   Procedure: VIDEO BRONCHOSCOPY WITHOUT FLUORO;  Surgeon: Marshell Garfinkel, MD;  Location: [facility];  Service: Cardiopulmonary;  Laterality: Bilateral;    Allergies  Allergen Reactions  . Clarithromycin Other (See Comments)    Adverse reaction w/ voriconazole  . Rifaximin Other (See Comments)    Adverse reaction w/ voriconazole    Immunization History  Administered Date(s) Administered  . DTaP 11/19/2010  . Influenza Split 10/29/2011, 08/14/2012  . Influenza Whole 09/19/2010  . Influenza,inj,Quad PF,6+ Mos 11/23/2013, 09/28/2014, 09/06/2015, 08/27/2016, 08/10/2017  . Pneumococcal Conjugate-13 04/29/2017, 04/29/2017  . Pneumococcal Polysaccharide-23 12/17/2010    Family History  Adopted: Yes     Current Outpatient Medications:  .  albuterol (PROVENTIL HFA;VENTOLIN HFA) 108 (90 Base) MCG/ACT inhaler, Inhale 2 puffs into the lungs every 6 (six) hours as needed. (Patient taking differently: Inhale 2 puffs into the lungs every 6 (six) hours as needed for wheezing or shortness of breath. ), Disp: 1 Inhaler, Rfl: 4 .  azithromycin (ZITHROMAX) 500 MG tablet, Take 500 mg by mouth daily., Disp: , Rfl: 1 .  Calcium Carbonate-Vitamin D 600-400 MG-UNIT tablet, Take by mouth., Disp: , Rfl:  .  CRESEMBA 186 MG CAPS, , Disp: , Rfl: 0 .  ethambutol (MYAMBUTOL) 400 MG tablet, TAKE TWO AND ONE-HALF TABLETS BY MOUTH ONCE DAILY (Patient taking differently: TAKE 1000 MG TABLETS BY MOUTH ONCE DAILY), Disp: 225 tablet, Rfl: 5 .  ferrous sulfate  (FERRO-BOB) 325 (65 FE) MG tablet, Take 325 mg by mouth daily.  , Disp: , Rfl:  .  fluticasone furoate-vilanterol (BREO ELLIPTA) 100-25 MCG/INH AEPB, Inhale 1 puff into the lungs daily., Disp: 1 each, Rfl: 5 .  levothyroxine (SYNTHROID, LEVOTHROID) 75 MCG tablet, Take 75 mcg by mouth daily. , Disp: , Rfl: 0 .  predniSONE (DELTASONE) 5 MG tablet, Take 1 tablet (5 mg total) by mouth daily with breakfast., Disp: 90 tablet, Rfl: 1 .  Tiotropium Bromide Monohydrate (SPIRIVA RESPIMAT) 2.5 MCG/ACT AERS, Inhale 2 puffs into the lungs daily., Disp: 1 Inhaler, Rfl: 5 .  EPINEPHrine 0.3 mg/0.3 mL IJ SOAJ injection, Inject 0.3 mLs (0.3 mg total) into the muscle once. (Patient not taking: Reported on 01/27/2018), Disp: 1 Device, Rfl: 11   Review of Systems     Objective:   Physical Exam  Constitutional: He is oriented to person, place, and time. He appears well-developed and well-nourished. No distress.  HENT:  Head: Normocephalic and atraumatic.  Right Ear: External ear normal.  Left Ear: External ear normal.  Mouth/Throat: Oropharynx is clear and moist. No oropharyngeal exudate.  Eyes: Conjunctivae and EOM are normal. Pupils are equal, round, and reactive to light. Right eye exhibits no discharge. Left eye exhibits no discharge. No scleral icterus.  Neck: Normal range of motion. Neck supple. No JVD present. No tracheal deviation present. No thyromegaly present.  Cardiovascular: Normal rate, regular rhythm and intact distal pulses. Exam reveals no gallop and no friction rub.  No murmur heard. Pulmonary/Chest: Effort normal and breath sounds normal. No respiratory distress. He has no wheezes. He has no rales. He exhibits no tenderness.  Abdominal: Soft. Bowel sounds are normal. He exhibits no distension and no mass. There is no tenderness. There is no rebound and no guarding.  Musculoskeletal: Normal range of motion. He exhibits no edema or tenderness.  Lymphadenopathy:    He has no cervical adenopathy.   Neurological: He is alert and oriented to person, place, and time. He has normal reflexes. No cranial nerve deficit. Coordination normal.  Skin: Skin is warm and dry. No rash noted. He is not diaphoretic. No erythema. No pallor.  Psychiatric: He has a normal mood and affect. His behavior is normal. Judgment and thought content normal.  Nursing note and vitals reviewed.  Vitals:   01/27/18 0906  BP: 118/72  Pulse: 62  SpO2: 100%  Weight: 121 lb (54.9 kg)  Height: _0  (1.753 m)    Estimated body mass index is 17.87 kg/m as calculated from the following:   Height as of this encounter: _1  (1.753 m).   Weight as of this encounter: 121 lb (54.9 kg).     Assessment:       ICD-10-CM   1. Severe persistent asthma without complication E08.14   2. 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 MBrand Males M.D., FMemorial Hospital And ManorC.P Pulmonary and Critical Care Medicine Staff Physician, CSwiftDirector - Interstitial Lung Disease  Program  Pulmonary [place] [id number] Pager: [phone] If no answer or between  15:00h - 7:00h: call [phone]  [phone] Telephone: [phone]

## 2018-01-27 NOTE — Patient Instructions (Addendum)
ICD-10-CM   1. Severe persistent asthma without complication T53.20   2. ABPA (allergic bronchopulmonary aspergillosis) ([place]) B44.81 POCT EXHALED NITRIC OXIDE  3. 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

## 2018-01-28 DIAGNOSIS — J4550 Severe persistent asthma, uncomplicated: Secondary | ICD-10-CM

## 2018-01-28 MED ORDER — OMALIZUMAB 150 MG ~~LOC~~ SOLR
375.0000 mg | Freq: Once | SUBCUTANEOUS | Status: AC
  Administered 2018-01-28: 375 mg via SUBCUTANEOUS

## 2018-01-28 NOTE — Progress Notes (Signed)
[name] came in on 01/27/18 to receive a Xolair injection. The patient is given 343m every 14 days. Due to each vial equalling 1571m 157mf medication was wasted.

## 2018-02-06 ENCOUNTER — Telehealth: Payer: Self-pay | Admitting: Internal Medicine

## 2018-02-10 ENCOUNTER — Ambulatory Visit: Payer: Self-pay

## 2018-02-13 NOTE — Telephone Encounter (Signed)
Called [facility] to check to see if pt's xolair shipment had been rescheduled.  Spoke with Sunshine to see if a shipment had been scheduled.  Sunshine stated they have been trying to call our office to verify pt's benefits with [place] so that way they can be able to schedule shipment of pt's Xolair.  Routing this to [employer] for her to follow up on.

## 2018-02-17 NOTE — Telephone Encounter (Signed)
[facility]. Said they needed a p/a, they  Gave me BC[employer] p/a dept. #. I called and they said his p/a is good thru 03/17/2018.  I [facility] and the rep I spoke to sent an e-mail to a pharmacist to get the rx  to the right business que. Rep said to call back 02/18/2018.

## 2018-02-18 ENCOUNTER — Telehealth: Payer: Self-pay | Admitting: Internal Medicine

## 2018-02-18 NOTE — Telephone Encounter (Signed)
Prefilled Syringe: #184m 4  #722m2 Ordered Date: 02/18/2018 Shipping Date: 02/19/2018

## 2018-02-19 NOTE — Telephone Encounter (Signed)
[facility]oke [facility]-states PA was still good and Xolair has been scheduled to ship out today and will be in the office Thursday 02/20/18. Will leave open for Tammy Scott to document when medication arrives. Pt will need to be contacted to make apt.

## 2018-02-24 ENCOUNTER — Ambulatory Visit: Payer: Self-pay

## 2018-02-24 NOTE — Telephone Encounter (Signed)
[facility]. They're still saying we need a p/a. I explained to them I called 2 wks ago and [employer] told me the p/a is good thru 03/17/2018, p/a #: [id number]. I called and spoke with [name]. At [facility] today. He gave me the same [facility]  p/a # and date again. Pt. Appt. Is 03/10/18. I called again gave them the call ref. #:[name]. [id number]. The rep. documented in the comp. The call ref#, rep's name, and the ph. # I called:[phone]. Delivery is set up for 02/27/2018. Will put in [employer] text. Will forward this to [facility] as I am not here the rest of the wk.Marland Kitchen

## 2018-02-24 NOTE — Telephone Encounter (Signed)
Previous order is void. Rsc for 02/27/18, confusion about p/a, it's all documented in ph. Note. So sorry Scientist, research (physical sciences). Thank you from the bottom of my heart. Prefilled Syringe: #118m 4  #772m2 Ordered Date: 02/24/2018 Shipping Date: 02/26/2018

## 2018-02-24 NOTE — Telephone Encounter (Signed)
Dominique with [facility] calling regarding this PA and states this must be extended due to the plan.  CB is [phone].  Fax [phone], ATTN: P/A Dept.

## 2018-02-25 ENCOUNTER — Telehealth: Payer: Self-pay | Admitting: Internal Medicine

## 2018-02-25 NOTE — Telephone Encounter (Signed)
Noted and benefit summary with patients file.

## 2018-02-27 ENCOUNTER — Telehealth: Payer: Self-pay | Admitting: Internal Medicine

## 2018-02-28 NOTE — Telephone Encounter (Signed)
[employer] and spoke with [place] who stated to me Judson Roch was calling to follow up on PA request that is on file per note.  Stated to [place] is the one who handles injections and PA's that need to be done regarding them but she was currently out of the office until Monday. Stated to her that [place] would need to be the one to follow up on this once she arrives to the office.  [place] expressed understanding and stated to me she would put a note on pt's file to follow up with [place] Monday.  Will route this to [employer].

## 2018-03-04 NOTE — Telephone Encounter (Signed)
Will call 03/05/18.

## 2018-03-04 NOTE — Telephone Encounter (Signed)
This is the first of two notes about p/a. Will call tomorrow.

## 2018-03-04 NOTE — Telephone Encounter (Signed)
Katie or Tammy, please advise on status of this for pt. Thanks!

## 2018-03-05 ENCOUNTER — Other Ambulatory Visit: Payer: Self-pay | Admitting: Internal Medicine

## 2018-03-05 NOTE — Telephone Encounter (Signed)
Routing this back to [employer]

## 2018-03-05 NOTE — Telephone Encounter (Signed)
Docia Barrier - [facility] calling to schedule Xolair shipment, [phone].

## 2018-03-05 NOTE — Telephone Encounter (Signed)
A p/a is not needed. His current p/a is good till 03/17/2018. The rep I spoke with siad the pt. needed to call in and give needed info.. They said they had tried several time with no success. I called the pt, [facility] and [facility] told [facility] needed a [facility]/a. Pt. [name] he would call them again. Will wait for pharmacy to call.

## 2018-03-05 NOTE — Telephone Encounter (Signed)
Tammy Scott please advise on PA. Thank you

## 2018-03-05 NOTE — Telephone Encounter (Signed)
[facility]-states they need to hear from patient to set up new patient enrollment-pt to call them at [phone]. Pt is aware of the needed step and will [facility] today. Hold for [facility] to contact our office to schedule shipment of Xolair.

## 2018-03-05 NOTE — Telephone Encounter (Signed)
Spoke with [employer] [name] as summary of Benefits stated MR was out of network and Nucala was not covered. After speaking with [place] they were able to fix the issue and stated Nucala is covered 100% for patient but PA was needed. Forms for PA completed faxed back to [facility] at [phone] as URGENT request. Will await a decision.

## 2018-03-05 NOTE — Telephone Encounter (Signed)
Please refer to 02/27/18 ph. Note. This makes 3 notes on the same thing. Closing, nothing further needed.

## 2018-03-06 NOTE — Telephone Encounter (Signed)
[name] is calling about Nucala the actual lab report of the esonoehis  [phone]

## 2018-03-06 NOTE — Telephone Encounter (Signed)
Returned call, put order in smart text. Nothing further needed.

## 2018-03-06 NOTE — Telephone Encounter (Addendum)
Prefilled Syringe: #186m 4  #776m2 Ordered Date: 03/05/18 Katie aware. Shipping Date: 03/12/2018 LeDocia Barrieralled to  order, I returned her call.

## 2018-03-10 ENCOUNTER — Ambulatory Visit: Payer: Self-pay

## 2018-03-11 NOTE — Telephone Encounter (Signed)
Spoke with [employer] today-PA is still pending and they need clinical and lab information faxed to [phone] with PA ref # [id number]. I have faxed all information requested and will wait for a decision-should have one by end of this week. Thanks.

## 2018-03-11 NOTE — Telephone Encounter (Signed)
Any updates?

## 2018-03-13 NOTE — Telephone Encounter (Signed)
Pat from [facility] called, stated labs that were sent over were old and did not have the new Eosinophils counts. They need something within the last 12 months. Can call with results and dates over the phone. Can leave message on direct line CB [phone]  Reference # [id number]  Tomorrow is the deadline

## 2018-03-13 NOTE — Telephone Encounter (Signed)
TS please advise.

## 2018-03-13 NOTE — Telephone Encounter (Signed)
Will call first thing tomorrow.

## 2018-03-13 NOTE — Telephone Encounter (Signed)
Larene Beach from [facility] for X[facility]air pre Josem Kaufmann wanted to follow up and check the status CB [phone]

## 2018-03-14 NOTE — Telephone Encounter (Signed)
[facility] aware that PA is still in process for Xolair.

## 2018-03-17 ENCOUNTER — Telehealth: Payer: Self-pay | Admitting: Internal Medicine

## 2018-03-17 NOTE — Telephone Encounter (Signed)
Routing this to TS to follow up on. Tammy please advise on this, thanks.

## 2018-03-17 NOTE — Telephone Encounter (Signed)
Called [employer] p/a dept. Back, they were closed, it was 5:05. Lmom asking them tcb.. If I haven't heard from them by 12:00 03/18/18 I'll cb at 1:00.

## 2018-03-18 NOTE — Telephone Encounter (Signed)
Will close encounter because there is a newer message.

## 2018-03-19 NOTE — Telephone Encounter (Signed)
[name] has been approved from 03-05-18 to 03-05-2019. [facility]. We are waiting to order until we have PA approved for Xolair-appears that Xolair was D/c as far as recent PA due to [facility] assuming patient was changing over to [place].    Started PA again for Xolair through [place] and told to fax clinicals to them at [phone] with Ref # [id number].   This has been faxed as requested and will wait until Friday for decision.

## 2018-03-19 NOTE — Telephone Encounter (Signed)
See 03-05-18 phone note for additional information on this matter.

## 2018-03-20 ENCOUNTER — Telehealth: Payer: Self-pay | Admitting: Internal Medicine

## 2018-03-20 NOTE — Telephone Encounter (Signed)
Pt. Has been approved for nucala: Ref #: [id number] Dates: 03/05/2018 thru 03/04/2018. Rep. Ran test claim ins.,  Is still under verification. Rep said she would e-mail benefits and have them put a rush on it.

## 2018-03-20 NOTE — Telephone Encounter (Signed)
Nothing further needed, please refer to ph. Not 03/05/17.

## 2018-03-21 ENCOUNTER — Telehealth: Payer: Self-pay | Admitting: Internal Medicine

## 2018-03-21 NOTE — Telephone Encounter (Signed)
Will route to TS

## 2018-03-24 ENCOUNTER — Telehealth: Payer: Self-pay | Admitting: Internal Medicine

## 2018-03-24 ENCOUNTER — Ambulatory Visit: Payer: Self-pay

## 2018-03-24 NOTE — Telephone Encounter (Signed)
Tammy please advise on this, thanks.

## 2018-03-24 NOTE — Telephone Encounter (Signed)
We have 2 different prior auths., ref #s, and pharmacy. One for April, the other for May, the effective date & exp. Dates are different too. Called [employer] they said to call [place].

## 2018-03-24 NOTE — Telephone Encounter (Signed)
I called [employer] and set up a new acct., once they call the pt. And get the info. They need, they will get his permission to ship and then call us to set up shipment.

## 2018-03-24 NOTE — Telephone Encounter (Signed)
Pharmacist needed to verify rx, did that. Once his benefits are verified, they'll call the pt to get his permission and then call us to set up shipment.

## 2018-03-25 NOTE — Telephone Encounter (Signed)
Will call [employer] 03/26/18 pm.

## 2018-03-25 NOTE — Telephone Encounter (Signed)
The p/a for xolair was also approved. Rrf.#: [id number] Dates: 03/19/2018 thru 03/19/2019.

## 2018-03-27 ENCOUNTER — Telehealth: Payer: Self-pay | Admitting: Internal Medicine

## 2018-03-27 NOTE — Telephone Encounter (Signed)
Randall Hiss, [place], [phone].  PA has been approved for Nucala.  If plan [facility] then we would need to call [phone]. If not, they will close it out if that is our decision.

## 2018-03-27 NOTE — Telephone Encounter (Signed)
[facility] and got the ref.#: [id number] Dates: 03/19/18 thru 03/19/2019. Will call to order if they have a rx on file, if not I will give a verbal to one of their pharmacist. (03/28/18)

## 2018-04-02 NOTE — Telephone Encounter (Signed)
[employer] is out of network for [employer]. Before he was getting his [facility]air from [facility], for yrs.. I called [employer] they are closed. I scheduled a cb. If they don't call back tomorrow morning I'll call them after lunch.

## 2018-04-02 NOTE — Telephone Encounter (Signed)
[name] please advise if anything further is needed.  Thanks.

## 2018-04-03 NOTE — Telephone Encounter (Signed)
Tammy please provide an update on this matter. Thanks.

## 2018-04-07 ENCOUNTER — Ambulatory Visit: Payer: Self-pay

## 2018-04-08 NOTE — Telephone Encounter (Signed)
Multiple phone notes open. TS please advise.

## 2018-04-08 NOTE — Telephone Encounter (Signed)
TS please advise if anything further is needed. Thanks.

## 2018-04-10 NOTE — Telephone Encounter (Signed)
[facility]. His xolair is coming in 04/11/18. Well, they had the 75m rx 2 PFS for 28 days supply. I gave a verbal for the [id number] 4 for a 28 days supply. Nothing further needed.

## 2018-04-10 NOTE — Telephone Encounter (Signed)
[facility]ala [facility], nothing further needed.

## 2018-04-10 NOTE — Telephone Encounter (Addendum)
Nucala is coming in 04/11/18. Nothing further needed.

## 2018-04-11 ENCOUNTER — Telehealth: Payer: Self-pay | Admitting: Internal Medicine

## 2018-04-11 NOTE — Telephone Encounter (Signed)
Called [facility] what is now [facility], They are verifying pt' ins.. I also called [facility] and cancelled the order there. Everything that was sent to [place] [facility] on it.

## 2018-04-15 NOTE — Telephone Encounter (Signed)
Prefilled Syringes: # 160m 4  #72m2 Arrival Date: 04/11/18 Lot #: [id number]   [id number] Date: 150mg49m2019   58mg 58m019

## 2018-04-21 ENCOUNTER — Encounter: Payer: Self-pay | Admitting: Internal Medicine

## 2018-04-21 NOTE — Telephone Encounter (Signed)
Error

## 2018-04-22 ENCOUNTER — Telehealth: Payer: Self-pay | Admitting: *Deleted

## 2018-04-22 NOTE — Telephone Encounter (Signed)
Patient left message in triage asking for assistance, stating his cressemba needs prior authorization again.  Will 'cc pharmacy, as they were able to get this authorized and with $0 copay last time. Landis Gandy, RN

## 2018-04-23 MED ORDER — ISAVUCONAZONIUM SULFATE 186 MG PO CAPS: 372.0000 mg | ORAL_CAPSULE | Freq: Every day | ORAL | 11 refills | Status: DC

## 2018-04-23 NOTE — Addendum Note (Signed)
Addended by: Darletta Moll on: 04/23/2018 09:36 AM   Modules accepted: Orders

## 2018-04-23 NOTE — Telephone Encounter (Addendum)
Yes- I will work on it!  Completed PA for patient renewal for Cresemba on cover my meds.

## 2018-04-24 NOTE — Telephone Encounter (Signed)
Prior Auth approved.  [facility] and it went through with no issues.

## 2018-04-24 NOTE — Telephone Encounter (Signed)
Awesome- Thanks!!!

## 2018-04-24 NOTE — Telephone Encounter (Signed)
Perfect!

## 2018-05-25 ENCOUNTER — Other Ambulatory Visit: Payer: Self-pay | Admitting: Infectious Disease

## 2018-05-25 DIAGNOSIS — A310 Pulmonary mycobacterial infection: Secondary | ICD-10-CM

## 2018-06-03 ENCOUNTER — Telehealth: Payer: Self-pay | Admitting: *Deleted

## 2018-06-03 DIAGNOSIS — A310 Pulmonary mycobacterial infection: Secondary | ICD-10-CM

## 2018-06-03 MED ORDER — AZITHROMYCIN 500 MG PO TABS: 500.0000 mg | ORAL_TABLET | Freq: Every day | ORAL | 11 refills | Status: DC

## 2018-06-03 NOTE — Telephone Encounter (Signed)
Yes he needs to be on his antifungals and his anti MAVIUM drugs forever basically

## 2018-06-03 NOTE — Addendum Note (Signed)
Addended by: Reggy Eye on: 06/03/2018 05:18 PM   Modules accepted: Orders

## 2018-06-03 NOTE — Telephone Encounter (Signed)
Patient called to ask for refill on Azithromycin. Dose he still need this medication? His next appt is 06/17/18 at 845.

## 2018-06-17 ENCOUNTER — Encounter: Payer: Self-pay | Admitting: Family

## 2018-06-17 ENCOUNTER — Ambulatory Visit: Payer: BLUE CROSS/BLUE SHIELD | Admitting: Family

## 2018-06-17 VITALS — BP 126/78 | HR 70 | Temp 98.1°F | Resp 16 | Ht 69.0 in | Wt 129.0 lb

## 2018-06-17 DIAGNOSIS — B449 Aspergillosis, unspecified: Secondary | ICD-10-CM

## 2018-06-17 DIAGNOSIS — A310 Pulmonary mycobacterial infection: Secondary | ICD-10-CM

## 2018-06-17 LAB — COMPREHENSIVE METABOLIC PANEL
AG RATIO: 1.7 (calc) (ref 1.0–2.5)
ALBUMIN MSPROF: 4.1 g/dL (ref 3.6–5.1)
ALT: 9 U/L (ref 9–46)
AST: 15 U/L (ref 10–40)
Alkaline phosphatase (APISO): 87 U/L (ref 40–115)
BUN: 9 mg/dL (ref 7–25)
CHLORIDE: 103 mmol/L (ref 98–110)
CO2: 30 mmol/L (ref 20–32)
CREATININE: 1.01 mg/dL (ref 0.60–1.35)
Calcium: 8.7 mg/dL (ref 8.6–10.3)
GLOBULIN: 2.4 g/dL (ref 1.9–3.7)
GLUCOSE: 43 mg/dL — AB (ref 65–99)
POTASSIUM: 3.4 mmol/L — AB (ref 3.5–5.3)
SODIUM: 140 mmol/L (ref 135–146)
TOTAL PROTEIN: 6.5 g/dL (ref 6.1–8.1)
Total Bilirubin: 0.7 mg/dL (ref 0.2–1.2)

## 2018-06-17 LAB — CBC
HCT: 42.5 % (ref 38.5–50.0)
HEMOGLOBIN: 14.9 g/dL (ref 13.2–17.1)
MCH: 32 pg (ref 27.0–33.0)
MCHC: 35.1 g/dL (ref 32.0–36.0)
MCV: 91.4 fL (ref 80.0–100.0)
MPV: 9.2 fL (ref 7.5–12.5)
PLATELETS: 217 10*3/uL (ref 140–400)
RBC: 4.65 10*6/uL (ref 4.20–5.80)
RDW: 12.4 % (ref 11.0–15.0)
WBC: 7.4 10*3/uL (ref 3.8–10.8)

## 2018-06-17 MED ORDER — ETHAMBUTOL HCL 400 MG PO TABS: ORAL_TABLET | ORAL | 5 refills | Status: DC

## 2018-06-17 NOTE — Assessment & Plan Note (Signed)
[name] appears stable with the current medication regimen and no adverse side effects. Symptoms are well controlled and do not effect his daily activities. Will check CMP and CBC for therapeutic drug monitoring. Continue current dosage of Cresemba. Plan for follow up in 6 months or sooner if needed.

## 2018-06-17 NOTE — Assessment & Plan Note (Signed)
Mr. Wicklund appears stable with the current medication regimen and no adverse side effects. He is adherent to the medication regimen and symptoms are adequately controlled. He has failed two attempts to wean medication and will likely require lifelong medication. I will check CMP and CBC for therapeutic drug monitoring. Continue current dosage of azithromycin and ethambutol. Follow up in 6 months or sooner if needed.

## 2018-06-17 NOTE — Progress Notes (Signed)
Subjective:    Patient ID: [name], male    DOB: 1976-10-19, 42 y.o.   MRN: [id number]  Chief Complaint  Patient presents with  . Aspergilloma  . Mycobacterium Infection    HPI:  [name] is a 42 y.o. 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   [name] was previously seen in the office by Dr. Tommy Medal on 12/16/17. During that office visit he was experiencing a sun-exposure rash while being on the voriconazole which was then changed to [place]. He was continued on his azithromycin and ethambutol with likely life-long need as he has had exacerbation of symptoms when coming off medications. He was last seen on 01/27/18 by [name] and continues on prednisone, Spiriva, Breo and Zolar.   [name] has been taking his azithromycin, ethambutol, and Cresemba as prescribed with no adverse side effects or missed dosages. He is no longer taking the Xolair or Nucala there was prescribed by pulmonology secondary to issues with insurance and cost. He does continue to have congestion in the mornings, however describes his current status as "normal". He is able to continue with his activities of daily living with symptoms that he says are adequately controlled with the current medication regimen. Denies fevers, chills, sweats, nausea, vomiting, or diarrhea. He has had no recent weight changes. Does continue to have the rash located on the left side of his neck that has stayed about the same since initial onset and believed to be related to the voriconazole. He has had no recent hospitalizations.   Allergies  Allergen Reactions  . Clarithromycin Other (See Comments)    Adverse reaction w/ voriconazole  . Rifaximin Other (See Comments)    Adverse reaction w/ voriconazole      Outpatient Medications Prior to Visit  Medication Sig Dispense Refill  . albuterol (PROVENTIL HFA;VENTOLIN HFA) 108 (90 Base) MCG/ACT inhaler Inhale 2 puffs  into the lungs every 6 (six) hours as needed. (Patient taking differently: Inhale 2 puffs into the lungs every 6 (six) hours as needed for wheezing or shortness of breath. ) 1 Inhaler 4  . azithromycin (ZITHROMAX) 500 MG tablet Take 1 tablet (500 mg total) by mouth daily. 30 tablet 11  . Calcium Carbonate-Vitamin D 600-400 MG-UNIT tablet Take by mouth.    . EPINEPHrine 0.3 mg/0.3 mL IJ SOAJ injection Inject 0.3 mLs (0.3 mg total) into the muscle once. 1 Device 11  . ferrous sulfate (FERRO-BOB) 325 (65 FE) MG tablet Take 325 mg by mouth daily.      . fluticasone furoate-vilanterol (BREO ELLIPTA) 100-25 MCG/INH AEPB Inhale 1 puff into the lungs daily. 1 each 5  . Isavuconazonium Sulfate 186 MG CAPS Take 2 capsules (372 mg total) by mouth daily. 60 capsule 11  . levothyroxine (SYNTHROID, LEVOTHROID) 75 MCG tablet Take 75 mcg by mouth daily.   0  . predniSONE (DELTASONE) 5 MG tablet Take 1 tablet (5 mg total) by mouth daily with breakfast. 90 tablet 1  . Tiotropium Bromide Monohydrate (SPIRIVA RESPIMAT) 2.5 MCG/ACT AERS Inhale 2 puffs into the lungs daily. 1 Inhaler 5  . ethambutol (MYAMBUTOL) 400 MG tablet TAKE TWO AND ONE-HALF TABLETS BY MOUTH ONCE DAILY (Patient taking differently: TAKE 1000 MG TABLETS BY MOUTH ONCE DAILY) 225 tablet 5   No facility-administered medications prior to visit.      Past Medical History:  Diagnosis Date  . 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[place])   . Drug rash 12/16/2017  .  Esophageal reflux   . 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 Lung disease, bullous ([place])   . MAI (mycobacterium avium-intracellulare) ([place])   . Mold exposure 12/05/2015  . 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 Solar lentigo 08/08/2015  . Weight loss 10/15/2016     Past Surgical History:  Procedure Laterality Date  . left VATS  2012   thoracotomy and LUL apical posterior segmentectomy  . VIDEO BRONCHOSCOPY Bilateral 10/20/2015   Procedure: VIDEO BRONCHOSCOPY WITHOUT FLUORO;   Surgeon: Marshell Garfinkel, MD;  Location: [facility];  Service: Cardiopulmonary;  Laterality: Bilateral;     Review of Systems  Constitutional: Negative for chills, diaphoresis, fatigue, fever and unexpected weight change.  HENT: Positive for congestion.   Respiratory: Positive for shortness of breath (Occasional with heavy exertion). Negative for chest tightness and wheezing.   Cardiovascular: Negative for chest pain and leg swelling.  Gastrointestinal: Negative for constipation, diarrhea, nausea and rectal pain.      Objective:    BP 126/78   Pulse 70   Temp 98.1 F (36.7 C) (Oral)   Resp 16   Ht _0  (1.753 m)   Wt 129 lb (58.5 kg)   SpO2 97% Comment: RA  BMI 19.05 kg/m  Nursing note and vital signs reviewed.  Physical Exam  Constitutional: He is oriented to person, place, and time. He appears well-developed. He appears ill. No distress.  Thin appearing Asian male.   Cardiovascular: Normal rate, regular rhythm, normal heart sounds and intact distal pulses. Exam reveals no gallop and no friction rub.  No murmur heard. Pulmonary/Chest: Effort normal. No stridor. No respiratory distress. He has wheezes. He has no rales. He exhibits no tenderness.  Neurological: He is alert and oriented to person, place, and time.  Skin: Skin is warm and dry.  Psychiatric: He has a normal mood and affect. His behavior is normal. Judgment and thought content normal.       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 Symptoms are well controlled and do not effect his daily activities. Will check CMP and CBC for therapeutic drug monitoring. Continue current dosage of Cresemba. Plan for follow up in 6 months or sooner if needed.       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 He is adherent to the medication regimen and symptoms are adequately controlled. He has failed two attempts to wean medication and will likely require lifelong medication. I will check CMP and CBC for therapeutic drug monitoring. Continue current dosage of azithromycin and ethambutol. Follow up in 6 months or sooner if needed.       Relevant Medications   ethambutol (MYAMBUTOL) 400 MG tablet   Other Relevant Orders   CBC   Comprehensive metabolic panel       I am having [name] maintain his ferrous sulfate, levothyroxine, EPINEPHrine, albuterol, fluticasone furoate-vilanterol, Calcium Carbonate-Vitamin D, Tiotropium Bromide Monohydrate, predniSONE, Isavuconazonium Sulfate, azithromycin, and ethambutol.   Meds ordered this encounter  Medications  . ethambutol (MYAMBUTOL) 400 MG tablet    Sig: TAKE TWO AND ONE-HALF TABLETS BY MOUTH ONCE DAILY  Dispense:  225 tablet    Refill:  5    Order Specific Question:   Supervising Provider    Answer:   Carlyle Basques [[id number]]     Follow-up: Return in 6 months (on 12/18/2018), or if symptoms worsen or fail to improve.   Terri Piedra, MSN, FNP-C Nurse Practitioner [facility] Office phone: [phone] Pager: [place] number: [phone]

## 2018-06-17 NOTE — Patient Instructions (Signed)
Nice to meet you.  Please continue to take your medications as prescribed.   We will check your blood work today.  Continue to follow up with [name].  We will plan to see you back in 6 months or sooner if needed.

## 2018-06-19 ENCOUNTER — Telehealth: Payer: Self-pay | Admitting: *Deleted

## 2018-06-19 NOTE — Telephone Encounter (Signed)
Relayed message to [place], reminded him to eat small, frequent meals through out the day (he was fasting at labs, was hypoglycemic).  Confirmed refills sent to his pharmacy of choice and appointment in January. Landis Gandy, RN

## 2018-06-19 NOTE — Telephone Encounter (Signed)
-----  Message from [place], [place] sent at 06/18/2018  8:00 AM EDT ----- Please inform Mr. Basista that his white/red blood cells, kidney function, liver function and electroytes all look good. Continue to follow up in 6 months or sooner with Dr. Tommy Medal.

## 2018-08-11 ENCOUNTER — Ambulatory Visit: Payer: BLUE CROSS/BLUE SHIELD | Admitting: Adult Health

## 2018-08-11 ENCOUNTER — Other Ambulatory Visit (INDEPENDENT_AMBULATORY_CARE_PROVIDER_SITE_OTHER): Payer: BLUE CROSS/BLUE SHIELD

## 2018-08-11 ENCOUNTER — Ambulatory Visit (INDEPENDENT_AMBULATORY_CARE_PROVIDER_SITE_OTHER)
Admission: RE | Admit: 2018-08-11 | Discharge: 2018-08-11 | Disposition: A | Discharge status: Home / Self Care | Payer: BLUE CROSS/BLUE SHIELD | Source: Ambulatory Visit | Attending: Adult Health | Admitting: Adult Health

## 2018-08-11 ENCOUNTER — Encounter: Payer: Self-pay | Admitting: Adult Health

## 2018-08-11 ENCOUNTER — Telehealth: Payer: Self-pay | Admitting: *Deleted

## 2018-08-11 VITALS — BP 104/64 | HR 87 | Temp 97.9°F | Ht 66.75 in | Wt 115.2 lb

## 2018-08-11 DIAGNOSIS — B4481 Allergic bronchopulmonary aspergillosis: Secondary | ICD-10-CM | POA: Diagnosis not present

## 2018-08-11 DIAGNOSIS — J4550 Severe persistent asthma, uncomplicated: Secondary | ICD-10-CM | POA: Diagnosis not present

## 2018-08-11 DIAGNOSIS — B449 Aspergillosis, unspecified: Secondary | ICD-10-CM

## 2018-08-11 DIAGNOSIS — R042 Hemoptysis: Secondary | ICD-10-CM | POA: Diagnosis not present

## 2018-08-11 DIAGNOSIS — R634 Abnormal weight loss: Secondary | ICD-10-CM

## 2018-08-11 DIAGNOSIS — A310 Pulmonary mycobacterial infection: Secondary | ICD-10-CM | POA: Diagnosis not present

## 2018-08-11 LAB — COMPREHENSIVE METABOLIC PANEL
ALBUMIN: 4.3 g/dL (ref 3.5–5.2)
ALT: 10 U/L (ref 0–53)
AST: 12 U/L (ref 0–37)
Alkaline Phosphatase: 95 U/L (ref 39–117)
BUN: 14 mg/dL (ref 6–23)
CHLORIDE: 99 meq/L (ref 96–112)
CO2: 28 mEq/L (ref 19–32)
CREATININE: 0.94 mg/dL (ref 0.40–1.50)
Calcium: 9.4 mg/dL (ref 8.4–10.5)
GFR: 92.58 mL/min (ref >60.00)
Glucose, Bld: 99 mg/dL (ref 70–99)
Potassium: 4.2 mEq/L (ref 3.5–5.1)
SODIUM: 136 meq/L (ref 135–145)
TOTAL PROTEIN: 7.7 g/dL (ref 6.0–8.3)
Total Bilirubin: 0.6 mg/dL (ref 0.2–1.2)

## 2018-08-11 LAB — CBC WITH DIFFERENTIAL/PLATELET
BASOS PCT: 0.3 % (ref 0.0–3.0)
Basophils Absolute: 0 10*3/uL (ref 0.0–0.1)
EOS ABS: 0 10*3/uL (ref 0.0–0.7)
Eosinophils Relative: 0.1 % (ref 0.0–5.0)
HCT: 45 % (ref 39.0–52.0)
HEMOGLOBIN: 15.6 g/dL (ref 13.0–17.0)
LYMPHS ABS: 0.8 10*3/uL (ref 0.7–4.0)
Lymphocytes Relative: 7.5 % — ABNORMAL LOW (ref 12.0–46.0)
MCHC: 34.6 g/dL (ref 30.0–36.0)
MCV: 91.8 fl (ref 78.0–100.0)
MONO ABS: 0.3 10*3/uL (ref 0.1–1.0)
Monocytes Relative: 3.4 % (ref 3.0–12.0)
Neutro Abs: 8.9 10*3/uL — ABNORMAL HIGH (ref 1.4–7.7)
PLATELETS: 279 10*3/uL (ref 150.0–400.0)
RBC: 4.9 Mil/uL (ref 4.22–5.81)
RDW: 13.6 % (ref 11.5–15.5)
WBC: 10 10*3/uL (ref 4.0–10.5)

## 2018-08-11 LAB — TSH: TSH: 1.07 u[IU]/mL (ref 0.35–4.50)

## 2018-08-11 MED ORDER — FLUTICASONE FUROATE-VILANTEROL 200-25 MCG/INH IN AEPB: 1.0000 | INHALATION_SPRAY | Freq: Every day | RESPIRATORY_TRACT | 0 refills | Status: AC

## 2018-08-11 MED ORDER — LEVALBUTEROL HCL 0.63 MG/3ML IN NEBU
0.6300 mg | INHALATION_SOLUTION | Freq: Once | RESPIRATORY_TRACT | Status: AC
  Administered 2018-08-11: 0.63 mg via RESPIRATORY_TRACT

## 2018-08-11 MED ORDER — LEVALBUTEROL HCL 0.63 MG/3ML IN NEBU: 0.6300 mg | INHALATION_SOLUTION | RESPIRATORY_TRACT | 12 refills | Status: DC | PRN

## 2018-08-11 MED ORDER — PREDNISONE 10 MG PO TABS: 10.0000 mg | ORAL_TABLET | Freq: Every day | ORAL | 0 refills | Status: DC

## 2018-08-11 MED ORDER — AMOXICILLIN-POT CLAVULANATE 875-125 MG PO TABS: 1.0000 | ORAL_TABLET | Freq: Two times a day (BID) | ORAL | 0 refills | Status: AC

## 2018-08-11 NOTE — Telephone Encounter (Signed)
Patient saw pulmonology today, has lost 15 pounds since starting [place].  He has noticed decreased appetite, some GI symptoms.  He was advised to follow up with Dr Lucianne Lei Dam/Infectious Disease. Patient scheduled with Marya Amsler 10/2 (30 minute slot). Cassie aware as well.  RN advised patient to eat small, frequent meals through out the day that are nutritionally dense.  He replied that pulmonology suggested adding yogurt, peanut butter and jelly sandwiches. He will also add ensure-type drinks, may make milkshakes with ice cream. Landis Gandy, RN

## 2018-08-11 NOTE — Assessment & Plan Note (Signed)
?  Etiology . Possible medication side effect .  Add high protein diet  Check labs  follow up with PCP and ID

## 2018-08-11 NOTE — Progress Notes (Signed)
_0  ID: [name], male    DOB: 02-27-1976, 42 y.o.   MRN: [id number]  No chief complaint on file.   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  Hemoptysis s/p IR guided embolization 2016 .  Patient has a history of aspergilloma , status post VATS in 2012. 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 Followed at ID for MAC .   TEST  1.7/13 Ig E 800s ( 4000 2012 )  12/10/11 Sputum AFB Smear and culture negative (prelim) 12/10/11 Fungal sputum culture - negative (final) 01/30/2012 spirometry - fev1 1.7L/41%, ratip 52 - BEST EVER 01/30/2012 walking desaturation test: 185 feet x 3 laps: did not deaturate 05/2014 >Spiro fev1 1.7L/40%, ratio 55  Spirometry 06/25/2017 showed FEV1 at 28%, ratio 46, FVC 50%.  Events  2010 Dx with MAI >Azithro/Ethambutol 2012 Dx with Aspergilloma >VATS >started on Voriconazaol  2012 Dx w/ ABPA w/ High IgE >4000>started on prednisone  2013 tried off azithr/Etham but developed fever, Cx neg but restarted on rx.  2016 Hospitalzed with hemoptysis >IR guided embolization . FOB/BAL + Saccharomyces on fungal fx . Serum Galactomannan was neg.  [name] evaluation 04/2017 for Transplant -rejected. Felt to early for transplant - recommended pre-transplant weight goal 128 lbs for a BMI > 19  08/11/2018 Acute OV  : ABPA, MAC, Aspergilloma, Asthma  Patient presents for an acute office visit.  Patient says over the last 6 weeks he has had progressive cough wheezing and increased shortness of breath has had episodes of blood-tinged mucus.  Patient has a history of intermittent hemoptysis.  Patient was seen at urgent care last week for fever productive cough with associated sore throat diarrhea and wheezing.  Patient was given Depo-Medrol injection.  Started on [employer]  Says he is not feeling any better. Fever has  resolved but still coughing up thick yellow mucus .  Patient is on Breo Spiriva.   Has a history of ABPA is on prednisone 5 mg daily. He is followed by the ID clinic for MAC and Aspergilloma .  He remains on azithromycin, ethambutol, Cresemba  Chest xray today shows chronic changes without acute process.  Says over last few months feels he is getting weaker has low energy . No appetite and intermittent nausea and diarrhea. Weight is down 15 lbs over last 2-3 months .  We discussed a high protein diet.         Allergies  Allergen Reactions  . Clarithromycin Other (See Comments)    Adverse reaction w/ voriconazole  . Rifaximin Other (See Comments)    Adverse reaction w/ voriconazole    Immunization History  Administered Date(s) Administered  . DTaP 11/19/2010  . Influenza Split 10/29/2011, 08/14/2012  . Influenza Whole 09/19/2010  . Influenza,inj,Quad PF,6+ Mos 11/23/2013, 09/28/2014, 09/06/2015, 08/27/2016, 08/10/2017  . Pneumococcal Conjugate-13 04/29/2017, 04/29/2017  . Pneumococcal Polysaccharide-23 12/17/2010    Past Medical History:  Diagnosis Date  . 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[place]   . Drug rash 12/16/2017  . Esophageal reflux   . 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 Lung disease, bullous ([place]   . MAI (mycobacterium avium-intracellulare) ([place]   . Mold exposure 12/05/2015  . 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 Solar lentigo 08/08/2015  . Weight loss 10/15/2016    Tobacco History: Social History   Tobacco Use  Smoking  Status Former Smoker  . Packs/day: 0.70  . Years: 19.00  . Pack years: 13.30  . Types: Cigarettes  . Last attempt to quit: 11/20/2007  . Years since quitting: 10.7  Smokeless Tobacco Former Systems developer  . Types: Snuff  . Quit date: 11/19/1994   Counseling given: Not Answered   Outpatient Medications Prior to Visit  Medication Sig Dispense Refill  . albuterol (PROVENTIL HFA;VENTOLIN HFA) 108 (90 Base) MCG/ACT inhaler Inhale 2  puffs into the lungs every 6 (six) hours as needed. (Patient taking differently: Inhale 2 puffs into the lungs every 6 (six) hours as needed for wheezing or shortness of breath. ) 1 Inhaler 4  . azithromycin (ZITHROMAX) 500 MG tablet Take 1 tablet (500 mg total) by mouth daily. 30 tablet 11  . Calcium Carbonate-Vitamin D 600-400 MG-UNIT tablet Take by mouth.    . EPINEPHrine 0.3 mg/0.3 mL IJ SOAJ injection Inject 0.3 mLs (0.3 mg total) into the muscle once. 1 Device 11  . ethambutol (MYAMBUTOL) 400 MG tablet TAKE TWO AND ONE-HALF TABLETS BY MOUTH ONCE DAILY 225 tablet 5  . ferrous sulfate (FERRO-BOB) 325 (65 FE) MG tablet Take 325 mg by mouth daily.      . fluticasone furoate-vilanterol (BREO ELLIPTA) 100-25 MCG/INH AEPB Inhale 1 puff into the lungs daily. 1 each 5  . Isavuconazonium Sulfate 186 MG CAPS Take 2 capsules (372 mg total) by mouth daily. 60 capsule 11  . levothyroxine (SYNTHROID, LEVOTHROID) 75 MCG tablet Take 75 mcg by mouth daily.   0  . predniSONE (DELTASONE) 5 MG tablet Take 1 tablet (5 mg total) by mouth daily with breakfast. 90 tablet 1  . Tiotropium Bromide Monohydrate (SPIRIVA RESPIMAT) 2.5 MCG/ACT AERS Inhale 2 puffs into the lungs daily. 1 Inhaler 5   No facility-administered medications prior to visit.      Review of Systems  Constitutional:   No   , night sweats,  Fevers, chills,  +fatigue, or  lassitude.  HEENT:   No headaches,  Difficulty swallowing,  Tooth/dental problems, or  Sore throat,                No sneezing, itching, ear ache, nasal congestion, post nasal drip,   CV:  No chest pain,  Orthopnea, PND, swelling in lower extremities, anasarca, dizziness, palpitations, syncope.   GI  No heartburn, indigestion, abdominal pain, nausea, vomiting, diarrhea, change in bowel habits, loss of appetite, bloody stools.   Resp:    No chest wall deformity  Skin: no rash or lesions.  GU: no dysuria, change in color of urine, no urgency or frequency.  No flank pain,  no hematuria   MS:  No joint pain or swelling.  No decreased range of motion.  No back pain.    Physical Exam  BP 104/64 (BP Location: Left Arm, Cuff Size: Normal)   Pulse 87   Temp 97.9 F (36.6 C) (Oral)   Ht 5' 6.75" (1.695 m)   Wt 115 lb 3.2 oz (52.3 kg)   SpO2 100%   BMI 18.18 kg/m   GEN: A/Ox3; pleasant , NAD thin    HEENT:  [place]/AT,  EACs-clear, TMs-wnl, NOSE-clear, THROAT-clear, no lesions, no postnasal drip or exudate noted.   NECK:  Supple w/ fair ROM; no JVD; normal carotid impulses w/o bruits; no thyromegaly or nodules palpated; no lymphadenopathy.    RESP  Decreased BS in bases   no accessory muscle use, no dullness to percussion  CARD:  RRR, no m/r/g, no peripheral  edema, pulses intact, no cyanosis or clubbing.  GI:   Soft & nt; nml bowel sounds; no organomegaly or masses detected.   Musco: Warm bil, no deformities or joint swelling noted.   Neuro: alert, no focal deficits noted.    Skin: Warm, no lesions or rashes    Lab Results:  CBC  BMET   Assessment & Plan:   Severe persistent asthma Exacerbation with Bronchitis - CXR w/ no acute process  Increase BREO briefly . Increase steroids briefly  D/c flovent do not need double ICS inhalers.   Plan  Patient Instructions  Stop Flovent  Increase BREO 200 1 puff daily , rinse after use. Finish sample and then return to BREO 100  Increase Prednisone 44m daily for 2 weeks and then 579mdaily .  Labs today .  Follow up with ID regarding Cresemba causing GI issues .  Please eat often add High protein food and drinks.  Augmentin 87579mwice daily  For 1 week , take with food . Eat yogurt.  Follow up with Dr. RamChase Caller Daysen Gundrum NP in 3 weeks and As needed   Please contact office for sooner follow up if symptoms do not improve or worsen or seek emergency care        Mycobacterium avium-intracellulare complex ([place] on current regimen with [facility] .   ABPA (allergic bronchopulmonary aspergillosis)  ([facility]) Cont on daily prednisone . CXR today w/ no acute changes.   Weight loss ? Etiology . Possible medication side effect .  Add high protein diet  Check labs  follow up with PCP and ID   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[place] on current regimen  Now on Cresemba ? Causing GI side effects  follow up with ID      TamRexene EdisonP 08/11/2018

## 2018-08-11 NOTE — Assessment & Plan Note (Signed)
Cont on current regimen with ID .

## 2018-08-11 NOTE — Assessment & Plan Note (Signed)
Cont on current regimen  Now on Cresemba ? Causing GI side effects  follow up with ID

## 2018-08-11 NOTE — Assessment & Plan Note (Signed)
Exacerbation with Bronchitis - CXR w/ no acute process  Increase BREO briefly . Increase steroids briefly  D/c flovent do not need double ICS inhalers.   Plan  Patient Instructions  Stop Flovent  Increase BREO 200 1 puff daily , rinse after use. Finish sample and then return to BREO 100  Increase Prednisone 36m daily for 2 weeks and then 515mdaily .  Labs today .  Follow up with ID regarding Cresemba causing GI issues .  Please eat often add High protein food and drinks.  Augmentin 87598mwice daily  For 1 week , take with food . Eat yogurt.  Follow up with Dr. RamChase Caller Davien Malone NP in 3 weeks and As needed   Please contact office for sooner follow up if symptoms do not improve or worsen or seek emergency care

## 2018-08-11 NOTE — Assessment & Plan Note (Signed)
Cont on daily prednisone . CXR today w/ no acute changes.

## 2018-08-11 NOTE — Patient Instructions (Addendum)
Stop Flovent  Increase BREO 200 1 puff daily , rinse after use. Finish sample and then return to BREO 100  Increase Prednisone 89m daily for 2 weeks and then 534mdaily .  Labs today .  Follow up with ID regarding Cresemba causing GI issues .  Please eat often add High protein food and drinks.  Augmentin 87525mwice daily  For 1 week , take with food . Eat yogurt.  Follow up with Dr. RamChase Caller Galen Malkowski NP in 3 weeks and As needed   Please contact office for sooner follow up if symptoms do not improve or worsen or seek emergency care

## 2018-08-11 NOTE — Addendum Note (Signed)
Addended by: Parke Poisson E on: 08/11/2018 04:50 PM   Modules accepted: Orders

## 2018-08-12 NOTE — Telephone Encounter (Signed)
We had troubles with voriconazole as well I believe and he is getting cressemba through assistance. Not an easy answer. Maybe posaconazole?

## 2018-08-20 ENCOUNTER — Ambulatory Visit: Payer: Self-pay | Admitting: Family

## 2018-09-01 ENCOUNTER — Ambulatory Visit: Payer: Self-pay | Admitting: Internal Medicine

## 2018-09-02 ENCOUNTER — Ambulatory Visit: Payer: Self-pay | Admitting: Adult Health

## 2018-09-03 ENCOUNTER — Telehealth: Payer: Self-pay | Admitting: Internal Medicine

## 2018-09-03 NOTE — Telephone Encounter (Signed)
Routing message to [employer] and [place] [place] for them to follow up on.

## 2018-09-03 NOTE — Telephone Encounter (Signed)
[name] has that info. Thanks, Raquel Sarna.

## 2018-09-03 NOTE — Telephone Encounter (Signed)
Yes definitely continue/restart xolair  When is nucala starting?

## 2018-09-03 NOTE — Telephone Encounter (Signed)
MR, Pt last seen 07-2018 by TP and has not rec'd any Xolair injections since 01/27/18. We have been working with [employer] as well to get Nucala injections added on per your request.   Please advise if you would like for patient to continue injections and if so we will contact patient to set up appt and have him wait another 2 hours again. Pt currently has doses of Xolair on hand.

## 2018-09-04 ENCOUNTER — Emergency Department (HOSPITAL_BASED_OUTPATIENT_CLINIC_OR_DEPARTMENT_OTHER): Payer: BLUE CROSS/BLUE SHIELD

## 2018-09-04 ENCOUNTER — Encounter (HOSPITAL_BASED_OUTPATIENT_CLINIC_OR_DEPARTMENT_OTHER): Payer: Self-pay | Admitting: *Deleted

## 2018-09-04 ENCOUNTER — Inpatient Hospital Stay (HOSPITAL_BASED_OUTPATIENT_CLINIC_OR_DEPARTMENT_OTHER)
Admission: EM | Admit: 2018-09-04 | Discharge: 2018-10-06 | DRG: 163 | Disposition: A | Discharge status: Home Health | Payer: BLUE CROSS/BLUE SHIELD | Attending: Internal Medicine | Admitting: Internal Medicine

## 2018-09-04 ENCOUNTER — Inpatient Hospital Stay (HOSPITAL_BASED_OUTPATIENT_CLINIC_OR_DEPARTMENT_OTHER): Payer: BLUE CROSS/BLUE SHIELD

## 2018-09-04 ENCOUNTER — Other Ambulatory Visit: Payer: Self-pay

## 2018-09-04 DIAGNOSIS — A310 Pulmonary mycobacterial infection: Secondary | ICD-10-CM | POA: Diagnosis present

## 2018-09-04 DIAGNOSIS — Z681 Body mass index (BMI) 19 or less, adult: Secondary | ICD-10-CM | POA: Diagnosis not present

## 2018-09-04 DIAGNOSIS — E039 Hypothyroidism, unspecified: Secondary | ICD-10-CM | POA: Diagnosis not present

## 2018-09-04 DIAGNOSIS — Z4682 Encounter for fitting and adjustment of non-vascular catheter: Secondary | ICD-10-CM

## 2018-09-04 DIAGNOSIS — Z79899 Other long term (current) drug therapy: Secondary | ICD-10-CM

## 2018-09-04 DIAGNOSIS — Z881 Allergy status to other antibiotic agents status: Secondary | ICD-10-CM

## 2018-09-04 DIAGNOSIS — J930 Spontaneous tension pneumothorax: Principal | ICD-10-CM | POA: Diagnosis present

## 2018-09-04 DIAGNOSIS — B440 Invasive pulmonary aspergillosis: Secondary | ICD-10-CM | POA: Diagnosis present

## 2018-09-04 DIAGNOSIS — K660 Peritoneal adhesions (postprocedural) (postinfection): Secondary | ICD-10-CM | POA: Diagnosis present

## 2018-09-04 DIAGNOSIS — R197 Diarrhea, unspecified: Secondary | ICD-10-CM | POA: Diagnosis not present

## 2018-09-04 DIAGNOSIS — Z87891 Personal history of nicotine dependence: Secondary | ICD-10-CM

## 2018-09-04 DIAGNOSIS — R001 Bradycardia, unspecified: Secondary | ICD-10-CM | POA: Diagnosis present

## 2018-09-04 DIAGNOSIS — R0902 Hypoxemia: Secondary | ICD-10-CM | POA: Diagnosis present

## 2018-09-04 DIAGNOSIS — J9311 Primary spontaneous pneumothorax: Secondary | ICD-10-CM | POA: Diagnosis not present

## 2018-09-04 DIAGNOSIS — R0600 Dyspnea, unspecified: Secondary | ICD-10-CM

## 2018-09-04 DIAGNOSIS — Z23 Encounter for immunization: Secondary | ICD-10-CM | POA: Diagnosis not present

## 2018-09-04 DIAGNOSIS — Z7952 Long term (current) use of systemic steroids: Secondary | ICD-10-CM

## 2018-09-04 DIAGNOSIS — Z9689 Presence of other specified functional implants: Secondary | ICD-10-CM | POA: Diagnosis not present

## 2018-09-04 DIAGNOSIS — J95812 Postprocedural air leak: Secondary | ICD-10-CM | POA: Diagnosis not present

## 2018-09-04 DIAGNOSIS — J90 Pleural effusion, not elsewhere classified: Secondary | ICD-10-CM | POA: Diagnosis not present

## 2018-09-04 DIAGNOSIS — Z77120 Contact with and (suspected) exposure to mold (toxic): Secondary | ICD-10-CM

## 2018-09-04 DIAGNOSIS — R402363 Coma scale, best motor response, obeys commands, at hospital admission: Secondary | ICD-10-CM | POA: Diagnosis present

## 2018-09-04 DIAGNOSIS — R0682 Tachypnea, not elsewhere classified: Secondary | ICD-10-CM | POA: Diagnosis present

## 2018-09-04 DIAGNOSIS — R000 Tachycardia, unspecified: Secondary | ICD-10-CM | POA: Diagnosis present

## 2018-09-04 DIAGNOSIS — J4550 Severe persistent asthma, uncomplicated: Secondary | ICD-10-CM | POA: Diagnosis present

## 2018-09-04 DIAGNOSIS — J939 Pneumothorax, unspecified: Secondary | ICD-10-CM

## 2018-09-04 DIAGNOSIS — R402253 Coma scale, best verbal response, oriented, at hospital admission: Secondary | ICD-10-CM | POA: Diagnosis present

## 2018-09-04 DIAGNOSIS — R402143 Coma scale, eyes open, spontaneous, at hospital admission: Secondary | ICD-10-CM | POA: Diagnosis present

## 2018-09-04 DIAGNOSIS — Z9889 Other specified postprocedural states: Secondary | ICD-10-CM

## 2018-09-04 DIAGNOSIS — J9383 Other pneumothorax: Secondary | ICD-10-CM

## 2018-09-04 DIAGNOSIS — Z7951 Long term (current) use of inhaled steroids: Secondary | ICD-10-CM

## 2018-09-04 DIAGNOSIS — R05 Cough: Secondary | ICD-10-CM | POA: Diagnosis present

## 2018-09-04 DIAGNOSIS — Z902 Acquired absence of lung [part of]: Secondary | ICD-10-CM

## 2018-09-04 DIAGNOSIS — R64 Cachexia: Secondary | ICD-10-CM | POA: Diagnosis present

## 2018-09-04 DIAGNOSIS — Z792 Long term (current) use of antibiotics: Secondary | ICD-10-CM

## 2018-09-04 DIAGNOSIS — Y838 Other surgical procedures as the cause of abnormal reaction of the patient, or of later complication, without mention of misadventure at the time of the procedure: Secondary | ICD-10-CM | POA: Diagnosis not present

## 2018-09-04 DIAGNOSIS — D509 Iron deficiency anemia, unspecified: Secondary | ICD-10-CM | POA: Diagnosis not present

## 2018-09-04 DIAGNOSIS — Z09 Encounter for follow-up examination after completed treatment for conditions other than malignant neoplasm: Secondary | ICD-10-CM

## 2018-09-04 DIAGNOSIS — E43 Unspecified severe protein-calorie malnutrition: Secondary | ICD-10-CM | POA: Diagnosis present

## 2018-09-04 DIAGNOSIS — T797XXA Traumatic subcutaneous emphysema, initial encounter: Secondary | ICD-10-CM | POA: Diagnosis not present

## 2018-09-04 DIAGNOSIS — A312 Disseminated mycobacterium avium-intracellulare complex (DMAC): Secondary | ICD-10-CM | POA: Diagnosis present

## 2018-09-04 HISTORY — DX: Spontaneous tension pneumothorax: J93.0

## 2018-09-04 LAB — CBC WITH DIFFERENTIAL/PLATELET
Abs Immature Granulocytes: 0.02 10*3/uL (ref 0.00–0.07)
BASOS ABS: 0 10*3/uL (ref 0.0–0.1)
Basophils Relative: 0 %
EOS ABS: 0 10*3/uL (ref 0.0–0.5)
Eosinophils Relative: 0 %
HEMATOCRIT: 44.7 % (ref 39.0–52.0)
Hemoglobin: 15.2 g/dL (ref 13.0–17.0)
IMMATURE GRANULOCYTES: 0 %
LYMPHS ABS: 0.2 10*3/uL — AB (ref 0.7–4.0)
Lymphocytes Relative: 2 %
MCH: 32.1 pg (ref 26.0–34.0)
MCHC: 34 g/dL (ref 30.0–36.0)
MCV: 94.5 fL (ref 80.0–100.0)
Monocytes Absolute: 0.3 10*3/uL (ref 0.1–1.0)
Monocytes Relative: 3 %
NEUTROS PCT: 95 %
NRBC: 0 % (ref 0.0–0.2)
Neutro Abs: 9.7 10*3/uL — ABNORMAL HIGH (ref 1.7–7.7)
PLATELETS: 187 10*3/uL (ref 150–400)
RBC: 4.73 MIL/uL (ref 4.22–5.81)
RDW: 13.9 % (ref 11.5–15.5)
WBC: 10.2 10*3/uL (ref 4.0–10.5)

## 2018-09-04 LAB — COMPREHENSIVE METABOLIC PANEL
ALBUMIN: 3.9 g/dL (ref 3.5–5.0)
ALT: 17 U/L (ref 0–44)
ANION GAP: 10 (ref 5–15)
AST: 24 U/L (ref 15–41)
Alkaline Phosphatase: 93 U/L (ref 38–126)
BILIRUBIN TOTAL: 0.7 mg/dL (ref 0.3–1.2)
BUN: 9 mg/dL (ref 6–20)
CHLORIDE: 102 mmol/L (ref 98–111)
CO2: 25 mmol/L (ref 22–32)
Calcium: 8.6 mg/dL — ABNORMAL LOW (ref 8.9–10.3)
Creatinine, Ser: 0.7 mg/dL (ref 0.61–1.24)
GFR calc Af Amer: >60 mL/min (ref >60)
GFR calc non Af Amer: >60 mL/min (ref >60)
GLUCOSE: 140 mg/dL — AB (ref 70–99)
POTASSIUM: 3.6 mmol/L (ref 3.5–5.1)
SODIUM: 137 mmol/L (ref 135–145)
Total Protein: 7.3 g/dL (ref 6.5–8.1)

## 2018-09-04 MED ORDER — HYDROMORPHONE HCL 1 MG/ML IJ SOLN
1.0000 mg | INTRAMUSCULAR | Status: DC | PRN
  Administered 2018-09-05 – 2018-09-12 (×10): 1 mg via INTRAVENOUS
  Filled 2018-09-04 (×11): qty 1

## 2018-09-04 MED ORDER — LIDOCAINE HCL (PF) 1 % IJ SOLN
INTRAMUSCULAR | Status: AC
  Filled 2018-09-04: qty 5

## 2018-09-04 MED ORDER — ONDANSETRON HCL 4 MG/2ML IJ SOLN
4.0000 mg | Freq: Once | INTRAMUSCULAR | Status: AC
  Administered 2018-09-04: 4 mg via INTRAVENOUS
  Filled 2018-09-04: qty 2

## 2018-09-04 MED ORDER — ALBUTEROL SULFATE (2.5 MG/3ML) 0.083% IN NEBU: 2.5000 mg | INHALATION_SOLUTION | RESPIRATORY_TRACT | Status: DC | PRN

## 2018-09-04 MED ORDER — LIDOCAINE-EPINEPHRINE 1 %-1:100000 IJ SOLN
INTRAMUSCULAR | Status: AC
  Filled 2018-09-04: qty 1

## 2018-09-04 MED ORDER — HYDROMORPHONE HCL 1 MG/ML IJ SOLN
1.0000 mg | Freq: Once | INTRAMUSCULAR | Status: AC
  Administered 2018-09-04: 1 mg via INTRAVENOUS
  Filled 2018-09-04: qty 1

## 2018-09-04 MED ORDER — ACETAMINOPHEN 325 MG PO TABS
650.0000 mg | ORAL_TABLET | Freq: Four times a day (QID) | ORAL | Status: DC | PRN
  Administered 2018-09-07: 650 mg via ORAL
  Filled 2018-09-04: qty 2

## 2018-09-04 MED ORDER — IOPAMIDOL (ISOVUE-370) INJECTION 76%
100.0000 mL | Freq: Once | INTRAVENOUS | Status: AC | PRN
  Administered 2018-09-04: 100 mL via INTRAVENOUS

## 2018-09-04 MED ORDER — LIDOCAINE-EPINEPHRINE 1 %-1:100000 IJ SOLN
20.0000 mL | Freq: Once | INTRAMUSCULAR | Status: AC
  Administered 2018-09-04: 16:00:00 via INTRADERMAL

## 2018-09-04 MED ORDER — DM-GUAIFENESIN ER 30-600 MG PO TB12
1.0000 | ORAL_TABLET | Freq: Two times a day (BID) | ORAL | Status: DC | PRN
  Administered 2018-09-08 – 2018-09-11 (×2): 1 via ORAL
  Filled 2018-09-04 (×2): qty 1

## 2018-09-04 MED ORDER — ONDANSETRON HCL 4 MG/2ML IJ SOLN: 4.0000 mg | Freq: Three times a day (TID) | INTRAMUSCULAR | Status: DC | PRN

## 2018-09-04 MED ORDER — ZOLPIDEM TARTRATE 5 MG PO TABS
5.0000 mg | ORAL_TABLET | Freq: Every evening | ORAL | Status: DC | PRN
  Administered 2018-09-05 – 2018-10-06 (×8): 5 mg via ORAL
  Filled 2018-09-04 (×8): qty 1

## 2018-09-04 MED ORDER — OXYCODONE-ACETAMINOPHEN 5-325 MG PO TABS
1.0000 | ORAL_TABLET | ORAL | Status: DC | PRN
  Administered 2018-09-05 – 2018-09-11 (×19): 1 via ORAL
  Filled 2018-09-04 (×20): qty 1

## 2018-09-04 NOTE — ED Provider Notes (Signed)
[facility] EMERGENCY DEPARTMENT Provider Note   CSN: [id number] Arrival date & time: 09/04/18  1326     History   Chief Complaint Chief Complaint  Patient presents with  . Shortness of Breath    HPI ELMIN WIEDERHOLT is a 42 y.o. male.  HPI   Ashlin Hidalgo is a 42 year old male with a history of aspergilloma and mycobacterium avium pulmonary infection presents to the Emergency Department for evaluation of shortness of breath.  Patient reports that he has had constant shortness of breath since about 9:30 AM today.  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  He states that he has a chronic cough which is unchanged from baseline, no hemoptysis.  He does not use home O2, states that he walked 2 laps today and his pulse ox dropped from 95% to 85% on room air.  He takes azithromycin and ethambutol chronically, has been taking as prescribed. Tried using his albuterol inhaler today without relief of his symptoms. No wheezing, chest tightness or chest pain.  He does report that he noticed bruising and swelling on his right lower leg without known injury.  Denies history of DVT/PE, recent surgery or immobilization, active cancer.  He denies fevers, chills, sore throat, congestion, abdominal pain, lightheadedness, weakness, syncope.  Past Medical History:  Diagnosis Date  . 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[place])   . Drug rash 12/16/2017  . Esophageal reflux   . 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 Lung disease, bullous ([place])   . MAI (mycobacterium avium-intracellulare) ([place])   . Mold exposure 12/05/2015  . 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 Solar lentigo 08/08/2015  . Weight loss 10/15/2016    Patient Active Problem List   Diagnosis Date Noted  . 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 [facility]) 06/17/2018  . Drug rash 12/16/2017  . 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 GERD (gastroesophageal reflux disease)  08/09/2017  . Hypothyroidism 08/09/2017  . Iron deficiency anemia 08/09/2017  . Weight loss 10/15/2016  . Severe persistent asthma 01/16/2016  . Mold exposure 12/05/2015  . 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 Hemoptysis   . 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 Solar lentigo 08/08/2015  . Dyspnea 05/24/2014  . Osteoporosis, unspecified 03/10/2013  . Osteoporosis 09/22/2012  . Lung disease, chronic obstructive ([place]) 01/30/2012  . Mycobacterium avium infection ([place]) 06/27/2011  . 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[place]) 03/27/2011  . 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 Mycobacterium avium complex ([place]) 12/28/2010    Past Surgical History:  Procedure Laterality Date  . left VATS  2012   thoracotomy and LUL apical posterior segmentectomy  . VIDEO BRONCHOSCOPY Bilateral 10/20/2015   Procedure: VIDEO BRONCHOSCOPY WITHOUT FLUORO;  Surgeon: Marshell Garfinkel, MD;  Location: [facility];  Service: Cardiopulmonary;  Laterality: Bilateral;        Home Medications    Prior to Admission medications   Medication Sig Start Date End Date Taking? Authorizing Provider  albuterol (PROVENTIL HFA;VENTOLIN HFA) 108 (90 Base) MCG/ACT inhaler Inhale 2 puffs into the lungs every 6 (six) hours as needed. Patient taking differently: Inhale 2 puffs into the lungs every 6 (six) hours as needed for wheezing or shortness of breath.  07/12/17   Brand Males, MD  azithromycin (ZITHROMAX) 500 MG tablet Take 1 tablet (500 mg total) by mouth daily. 06/03/18   Truman Hayward, MD  Calcium Carbonate-Vitamin D 600-400 MG-UNIT tablet Take by mouth.  [provider]  EPINEPHrine 0.3 mg/0.3 mL IJ SOAJ injection Inject 0.3 mLs (0.3 mg total) into the muscle once. 04/20/16   Mannam, Hart Robinsons, MD  ethambutol (MYAMBUTOL) 400 MG tablet TAKE TWO AND ONE-HALF TABLETS BY MOUTH ONCE DAILY 06/17/18   Golden Circle, FNP  ferrous sulfate (FERRO-BOB) 325 (65 FE) MG tablet  Take 325 mg by mouth daily.      [provider]  fluticasone furoate-vilanterol (BREO ELLIPTA) 100-25 MCG/INH AEPB Inhale 1 puff into the lungs daily. 08/26/17   Parrett, Fonnie Mu, NP  Isavuconazonium Sulfate 186 MG CAPS Take 2 capsules (372 mg total) by mouth daily. 04/23/18   Kuppelweiser, Cassie L, RPH-CPP  levothyroxine (SYNTHROID, LEVOTHROID) 75 MCG tablet Take 75 mcg by mouth daily.  09/27/14   [provider]  predniSONE (DELTASONE) 10 MG tablet Take 1 tablet (10 mg total) by mouth daily with breakfast. 08/11/18   Parrett, Fonnie Mu, NP  predniSONE (DELTASONE) 5 MG tablet Take 1 tablet (5 mg total) by mouth daily with breakfast. 01/10/18   Brand Males, MD  Tiotropium Bromide Monohydrate (SPIRIVA RESPIMAT) 2.5 MCG/ACT AERS Inhale 2 puffs into the lungs daily. 10/22/17   Brand Males, MD    Family History Family History  Adopted: Yes    Social History Social History   Tobacco Use  . Smoking status: Former Smoker    Packs/day: 0.70    Years: 19.00    Pack years: 13.30    Types: Cigarettes    Last attempt to quit: 11/20/2007    Years since quitting: 10.7  . Smokeless tobacco: Former Systems developer    Types: Snuff    Quit date: 11/19/1994  Substance Use Topics  . Alcohol use: No  . Drug use: No     Allergies   Clarithromycin and Rifaximin   Review of Systems Review of Systems  Constitutional: Negative for chills and fever.  HENT: Negative for congestion, sinus pressure and sore throat.   Eyes: Negative for visual disturbance.  Respiratory: Positive for cough and shortness of breath. Negative for wheezing.   Cardiovascular: Positive for leg swelling. Negative for chest pain.  Gastrointestinal: Negative for abdominal pain, nausea and vomiting.  Genitourinary: Negative for difficulty urinating.  Musculoskeletal: Negative for gait problem.  Skin: Positive for color change (bruising right lower leg).  Neurological: Negative for weakness and numbness.    Psychiatric/Behavioral: Negative for agitation.     Physical Exam Updated Vital Signs BP 114/75 (BP Location: Left Arm)   Pulse (!) 58   Temp 98 F (36.7 C) (Oral)   Resp 18   Ht _0  (1.702 m)   Wt 51.3 kg   SpO2 100%   BMI 17.70 kg/m   Physical Exam  Constitutional: He is oriented to person, place, and time. He appears well-developed and well-nourished. No distress.  Appears very thin.  No acute distress.  HENT:  Head: Normocephalic and atraumatic.  Mouth/Throat: Oropharynx is clear and moist.  Eyes: Pupils are equal, round, and reactive to light. Conjunctivae are normal. Right eye exhibits no discharge. Left eye exhibits no discharge.  Neck: Normal range of motion. Neck supple.  Cardiovascular: Normal rate and regular rhythm.  Pulmonary/Chest: Effort normal. No respiratory distress.  On 2L [place]. No respiratory distress, speaking in full sentences.  Decreased lung sounds in right base. Crackles in left upper lobe.   Abdominal: Soft. Bowel sounds are normal.  Musculoskeletal:  Right medial lower leg with bruising. Right lower leg appears mildly swollen compared to left.  No overlying tenderness. DP pulses 2+ and symmetric bilaterally. Sensation to light touch intact in bilateral LE.   Neurological: He is alert and oriented to person, place, and time. Coordination normal.  Skin: Skin is warm and dry. Capillary refill takes less than 2 seconds. He is not diaphoretic.  Psychiatric: He has a normal mood and affect. His behavior is normal.  Nursing note and vitals reviewed.   ED Treatments / Results  Labs (all labs ordered are listed, but only abnormal results are displayed) Labs Reviewed  CBC WITH DIFFERENTIAL/PLATELET - Abnormal; Notable for the following components:      Result Value   Neutro Abs 9.7 (*)    Lymphs Abs 0.2 (*)    All other components within normal limits  COMPREHENSIVE METABOLIC PANEL - Abnormal; Notable for the following components:   Glucose, Bld 140  (*)    Calcium 8.6 (*)    All other components within normal limits    EKG None  Radiology Dg Chest 1 View  Result Date: 09/04/2018 CLINICAL DATA:  Status post chest tube EXAM: CHEST  1 VIEW COMPARISON:  AP portable view of the chest performed at 1646 hours, chest CT 09/04/2018 and 10/23/2016 FINDINGS: A lateral view of the chest is provided in conjunction with previously acquired frontal view of the chest. Pigtail catheter projects anteriorly in the substernal portion of the chest. Hyperinflated emphysematous appearance of the superimposed lung apices. Moderate chronic midthoracic compression deformities accentuating thoracic kyphosis. IMPRESSION: Bullous emphysematous changes at the apex of the lungs superimposed upon each other on the lateral view. Pigtail catheter projects within the retrosternal space this single lateral view. Apparent re-expansion of previously noted right-sided pneumothorax. Electronically Signed   By: Ashley Royalty M.D.   On: 09/04/2018 17:51   Ct Angio Chest Pe W And/or Wo Contrast  Result Date: 09/04/2018 CLINICAL DATA:  Worsening cough over the last 2 days. Shortness of breath beginning today. Chronic lung disease. EXAM: CT ANGIOGRAPHY CHEST WITH CONTRAST TECHNIQUE: Multidetector CT imaging of the chest was performed using the standard protocol during bolus administration of intravenous contrast. Multiplanar CT image reconstructions and MIPs were obtained to evaluate the vascular anatomy. CONTRAST:  178m ISOVUE-370 IOPAMIDOL (ISOVUE-370) INJECTION 76% COMPARISON:  Chest radiography 08/11/2018.  CT 08/09/2017. FINDINGS: Cardiovascular: Pulmonary arterial opacification is excellent. There are no pulmonary emboli. There is ordinary aortic atherosclerosis. No visible coronary artery calcification. Mediastinum/Nodes: No mediastinal or hilar adenopathy. Lungs/Pleura: Chronic bullous emphysema. Tension pneumothorax on the right with mediastinal shift towards the left and  depression of the right hemidiaphragm. Scattered areas of scarring and atelectasis in both lungs right more than left. Upper Abdomen: Negative Musculoskeletal: Old midthoracic compression fractures. No acute bone finding. Review of the MIP images confirms the above findings. IMPRESSION: Acute tension pneumothorax on the right in the setting of underlying bullous emphysema. Critical Value/emergent results were called by telephone at the time of interpretation on 09/04/2018 at 3:42 pm [facility], who verbally acknowledged these results. Electronically Signed   By: MNelson ChimesM.D.   On: 09/04/2018 15:43   Dg Chest Portable 1 View  Result Date: 09/04/2018 CLINICAL DATA:  Post chest tube insertion for right pneumothorax. EXAM: PORTABLE CHEST 1 VIEW COMPARISON:  Chest CT 09/04/2018 FINDINGS: Post right chest tube insertion. The tip of the chest tube crosses the midline into the left hemithorax. There right lung has reinflated. Bilateral upper lobe large bulla. Chronic emphysematous and fibrotic interstitial changes of bilateral lungs. The mediastinal structures are  persistently displaced to the left. IMPRESSION: Status post right chest tube placement. The tip of the chest tube crosses the midline into the left hemithorax, position uncertain. Reinflation of the right lung. Severe bullous emphysematous changes of bilateral lungs. These results were called by telephone at the time of interpretation on 09/04/2018 at 5:14 pm to Dr. Dayna Barker, who verbally acknowledged these results. Electronically Signed   By: Fidela Salisbury M.D.   On: 09/04/2018 17:14    Procedures Procedures (including critical care time) CRITICAL CARE Performed by: Glyn Ade   Total critical care time: 35 minutes  Critical care time was exclusive of separately billable procedures and treating other patients.  Critical care was necessary to treat or prevent imminent or life-threatening deterioration.  Critical care  was time spent personally by me on the following activities: development of treatment plan with patient and/or surrogate as well as nursing, discussions with consultants, evaluation of patient's response to treatment, examination of patient, obtaining history from patient or surrogate, ordering and performing treatments and interventions, ordering and review of laboratory studies, ordering and review of radiographic studies, pulse oximetry and re-evaluation of patient's condition.  Medications Ordered in ED Medications  lidocaine (PF) (XYLOCAINE) 1 % injection (has no administration in time range)  iopamidol (ISOVUE-370) 76 % injection 100 mL (100 mLs Intravenous Contrast Given 09/04/18 1514)  lidocaine-EPINEPHrine (XYLOCAINE W/EPI) 1 %-1:100000 (with pres) injection 20 mL ( Intradermal Given by Other 09/04/18 1600)     Initial Impression / Assessment and Plan / ED Course  I have reviewed the triage vital signs and the nursing notes.  Pertinent labs & imaging results that were available during my care of the patient were reviewed by me and considered in my medical decision making (see chart for details).  Patient with spontaneous tension pneumothorax with O2 sat 75% on room air on arrival. Pneumothorax likely related to his underlying MAC with bullae. Chest tube placed by Dr. Dayna Barker in the ED. Portable chest xray reveals chest tube in the left hemithorax. Discussed this patient with CT surgeon Dr. Roxan Hockey who would like the tube brought back slightly and admitted to medicine. I retracted the chest tube bag 7cm prior to transport.  He will be admitted to medicine.   Final Clinical Impressions(s) / ED Diagnoses   Final diagnoses:  Tension pneumothorax    ED Discharge Orders    None       Bernarda Caffey 09/04/18 1957    Mesner, Corene Cornea, MD 09/05/18 [phone]

## 2018-09-04 NOTE — ED Notes (Signed)
Pt. Reports he has had a cough more frequent in the 2-3 days  Pt. Said he has fungus in his lungs

## 2018-09-04 NOTE — ED Notes (Signed)
Pt. Has no cough noted and is tolerating chest tube with no difficulty

## 2018-09-04 NOTE — ED Notes (Signed)
XR at bedside

## 2018-09-04 NOTE — ED Notes (Signed)
Chest tube inserted to right chest.  Noted immediate return of air.  Pt tolerated very well.  Attached to pleuro-vac at 20cm.  Noted immediate air bubbles for 20 seconds and then ceased.  Pt states he is feeling better.

## 2018-09-04 NOTE — ED Triage Notes (Signed)
SOB this am.

## 2018-09-04 NOTE — H&P (Signed)
History and Physical    DREYDEN ROHRMAN OZD:[id number] DOB: 1976/10/22 DOA: 09/04/2018  Referring MD/NP/PA:   PCP: Wallene Dales, MD   Patient coming from:  The patient is coming from home.  At baseline, pt is independent for most of ADL. SNF  Assistant living facility   Retirement center.       Chief Complaint: SOB  HPI: KEYRON POKORSKI is a 42 y.o. male with medical history significant of asthma, GERD, hypothyroidism, aspergilloma, left upper lobe segmentectomy for aspergillomaMAI, lung bullous lung disease, who presents with shortness of breath.  Patient states that she suddenly started having shortness of breath this morning after coughing.  He has mild right-sided chest pain, but no fever or chills.  He has dry cough.  No nausea, vomiting, diarrhea, abdominal pain, symptoms of UTI.  No unilateral weakness.  ED Course: pt was found to have WBC 10.2, electrolytes renal function okay, bradycardia, tachypnea with 22, oxygen desaturation to 88% on room air initially, improved to 100% after chest tube placed, temperature normal.  CT angiogram is negative for PE, but showed tension pneumothorax and bullous erythematous change.  CT surgeon, Dr. Roxan Hockey was consulted.  Right-sided chest tube was placed.  Review of Systems:   General: no fevers, chills, no body weight gain, has fatigue HEENT: no blurry vision, hearing changes or sore throat Respiratory: has dyspnea, coughing, no wheezing CV: has chest pain, no palpitations GI: no nausea, vomiting, abdominal pain, diarrhea, constipation GU: no dysuria, burning on urination, increased urinary frequency, hematuria  Ext: no leg edema Neuro: no unilateral weakness, numbness, or tingling, no vision change or hearing loss Skin: no rash, no skin tear. MSK: No muscle spasm, no deformity, no limitation of range of movement in spin Heme: No easy bruising.  Travel history: No recent long distant travel.  Allergy:  Allergies  Allergen Reactions  .  Clarithromycin Other (See Comments)    Adverse reaction w/ voriconazole  . Rifaximin Other (See Comments)    Adverse reaction w/ voriconazole    Past Medical History:  Diagnosis Date  . 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[place])   . Drug rash 12/16/2017  . Esophageal reflux   . 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 Lung disease, bullous ([place])   . MAI (mycobacterium avium-intracellulare) ([place])   . Mold exposure 12/05/2015  . 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 Solar lentigo 08/08/2015  . Weight loss 10/15/2016    Past Surgical History:  Procedure Laterality Date  . left VATS  2012   thoracotomy and LUL apical posterior segmentectomy  . VIDEO BRONCHOSCOPY Bilateral 10/20/2015   Procedure: VIDEO BRONCHOSCOPY WITHOUT FLUORO;  Surgeon: Marshell Garfinkel, MD;  Location: [facility];  Service: Cardiopulmonary;  Laterality: Bilateral;    Social History:  reports that he quit smoking about 10 years ago. His smoking use included cigarettes. He has a 13.30 pack-year smoking history. He quit smokeless tobacco use about 23 years ago.  His smokeless tobacco use included snuff. He reports that he does not drink alcohol or use drugs.  Family History:  Family History  Adopted: Yes     Prior to Admission medications   Medication Sig Start Date End Date Taking? Authorizing Provider  albuterol (PROVENTIL HFA;VENTOLIN HFA) 108 (90 Base) MCG/ACT inhaler Inhale 2 puffs into the lungs every 6 (six) hours as needed. Patient taking differently: Inhale 2 puffs into the lungs every 6 (six) hours as needed for wheezing or shortness of breath.  07/12/17   Brand Males, MD  azithromycin (ZITHROMAX) 500 MG tablet Take 1 tablet (500 mg total) by mouth daily. 06/03/18   Truman Hayward, MD  Calcium Carbonate-Vitamin D 600-400 MG-UNIT tablet Take by mouth.    [provider]  EPINEPHrine 0.3 mg/0.3 mL IJ SOAJ injection Inject 0.3 mLs (0.3 mg total) into the muscle once. 04/20/16   Mannam,  Hart Robinsons, MD  ethambutol (MYAMBUTOL) 400 MG tablet TAKE TWO AND ONE-HALF TABLETS BY MOUTH ONCE DAILY 06/17/18   Golden Circle, FNP  ferrous sulfate (FERRO-BOB) 325 (65 FE) MG tablet Take 325 mg by mouth daily.      [provider]  fluticasone furoate-vilanterol (BREO ELLIPTA) 100-25 MCG/INH AEPB Inhale 1 puff into the lungs daily. 08/26/17   Parrett, Fonnie Mu, NP  Isavuconazonium Sulfate 186 MG CAPS Take 2 capsules (372 mg total) by mouth daily. 04/23/18   Kuppelweiser, Cassie L, RPH-CPP  levothyroxine (SYNTHROID, LEVOTHROID) 75 MCG tablet Take 75 mcg by mouth daily.  09/27/14   [provider]  predniSONE (DELTASONE) 10 MG tablet Take 1 tablet (10 mg total) by mouth daily with breakfast. 08/11/18   Parrett, Fonnie Mu, NP  predniSONE (DELTASONE) 5 MG tablet Take 1 tablet (5 mg total) by mouth daily with breakfast. 01/10/18   Brand Males, MD  Tiotropium Bromide Monohydrate (SPIRIVA RESPIMAT) 2.5 MCG/ACT AERS Inhale 2 puffs into the lungs daily. 10/22/17   Brand Males, MD    Physical Exam: Vitals:   09/04/18 2018 09/04/18 2130 09/05/18 0103 09/05/18 0351  BP: 119/77 124/75 117/73 98/66  Pulse: 71 63 61 (!) 57  Resp: _0 Temp: 98 F (36.7 C) 98 F (36.7 C) 98.2 F (36.8 C) (!) 97.5 F (36.4 C)  TempSrc: Oral Oral Oral Oral  SpO2: 100% 100% 100% 100%  Weight:      Height:  _1  (1.702 m)     General: Not in acute distress.  Chronically ill looking HEENT:       Eyes: PERRL, EOMI, no scleral icterus.       ENT: No discharge from the ears and nose, no pharynx injection, no tonsillar enlargement.        Neck: No JVD, no bruit, no mass felt. Heme: No neck lymph node enlargement. Cardiac: S1/S2, RRR, No murmurs, No gallops or rubs. Respiratory:No rales, wheezing, rhonchi or rubs. Right sided chest tube in place. GI: Soft, nondistended, nontender, no rebound pain, no organomegaly, BS present. GU: No hematuria Ext: No pitting leg edema bilaterally. 2+DP/PT  pulse bilaterally. Musculoskeletal: No joint deformities, No joint redness or warmth, no limitation of ROM in spin. Skin: No rashes.  Neuro: Alert, oriented X3, cranial nerves II-XII grossly intact, moves all extremities normally.  Psych: Patient is not psychotic, no suicidal or hemocidal ideation.  Labs on Admission: I have personally reviewed following labs and imaging studies  CBC: Recent Labs  Lab 09/04/18 1356 09/05/18 0229  WBC 10.2 8.9  NEUTROABS 9.7*  --   HGB 15.2 13.6  HCT 44.7 41.4  MCV 94.5 95.6  PLT 187 202   Basic Metabolic Panel: Recent Labs  Lab 09/04/18 1356 09/05/18 0229  NA 137 139  K 3.6 3.6  CL 102 104  CO2 25 29  GLUCOSE 140* 121*  BUN 9 <5*  CREATININE 0.70 0.74  CALCIUM 8.6* 8.4*   GFR: Estimated Creatinine Clearance: 85.5 mL/min (by C-G formula based on SCr of 0.74 mg/dL). Liver Function Tests: Recent Labs  Lab 09/04/18 1356  AST 24  ALT  17  ALKPHOS 93  BILITOT 0.7  PROT 7.3  ALBUMIN 3.9   No results for input(s): LIPASE, AMYLASE in the last 168 hours. No results for input(s): AMMONIA in the last 168 hours. Coagulation Profile: Recent Labs  Lab 09/05/18 0229  INR 1.14   Cardiac Enzymes: No results for input(s): CKTOTAL, CKMB, CKMBINDEX, TROPONINI in the last 168 hours. BNP (last 3 results) No results for input(s): PROBNP in the last 8760 hours. HbA1C: No results for input(s): HGBA1C in the last 72 hours. CBG: No results for input(s): GLUCAP in the last 168 hours. Lipid Profile: No results for input(s): CHOL, HDL, LDLCALC, TRIG, CHOLHDL, LDLDIRECT in the last 72 hours. Thyroid Function Tests: No results for input(s): TSH, T4TOTAL, FREET4, T3FREE, THYROIDAB in the last 72 hours. Anemia Panel: No results for input(s): VITAMINB12, FOLATE, FERRITIN, TIBC, IRON, RETICCTPCT in the last 72 hours. Urine analysis:    Component Value Date/Time   COLORURINE YELLOW 05/28/2011 [place] 05/28/2011 0943   LABSPEC  1.021 05/28/2011 0943   PHURINE 6.0 05/28/2011 0943   GLUCOSEU NEG 05/28/2011 0943   HGBUR NEG 05/28/2011 0943   BILIRUBINUR NEG 05/28/2011 0943   KETONESUR NEG 05/28/2011 0943   PROTEINUR NEG 05/28/2011 0943   UROBILINOGEN 0.2 05/28/2011 0943   NITRITE NEG 05/28/2011 0943   LEUKOCYTESUR NEG 05/28/2011 0943   Sepsis Labs: _0 (procalcitonin:4,lacticidven:4) ) Recent Results (from the past 240 hour(s))  MRSA PCR Screening     Status: None   Collection Time: 09/04/18  9:46 PM  Result Value Ref Range Status   MRSA by PCR NEGATIVE NEGATIVE Final    Comment:        The GeneXpert MRSA Assay (FDA approved for NASAL specimens only), is one component of a comprehensive MRSA colonization surveillance program. It is not intended to diagnose MRSA infection nor to guide or monitor treatment for MRSA infections. Performed at [facility], [place] [address] [place], [place] [address]      Radiological Exams on Admission: Dg Chest 1 View  Result Date: 09/04/2018 CLINICAL DATA:  Status post chest tube EXAM: CHEST  1 VIEW COMPARISON:  AP portable view of the chest performed at 1646 hours, chest CT 09/04/2018 and 10/23/2016 FINDINGS: A lateral view of the chest is provided in conjunction with previously acquired frontal view of the chest. Pigtail catheter projects anteriorly in the substernal portion of the chest. Hyperinflated emphysematous appearance of the superimposed lung apices. Moderate chronic midthoracic compression deformities accentuating thoracic kyphosis. IMPRESSION: Bullous emphysematous changes at the apex of the lungs superimposed upon each other on the lateral view. Pigtail catheter projects within the retrosternal space this single lateral view. Apparent re-expansion of previously noted right-sided pneumothorax. Electronically Signed   By: Ashley Royalty M.D.   On: 09/04/2018 17:51   Ct Angio Chest Pe W And/or Wo Contrast  Result Date: 09/04/2018 CLINICAL DATA:   Worsening cough over the last 2 days. Shortness of breath beginning today. Chronic lung disease. EXAM: CT ANGIOGRAPHY CHEST WITH CONTRAST TECHNIQUE: Multidetector CT imaging of the chest was performed using the standard protocol during bolus administration of intravenous contrast. Multiplanar CT image reconstructions and MIPs were obtained to evaluate the vascular anatomy. CONTRAST:  110m ISOVUE-370 IOPAMIDOL (ISOVUE-370) INJECTION 76% COMPARISON:  Chest radiography 08/11/2018.  CT 08/09/2017. FINDINGS: Cardiovascular: Pulmonary arterial opacification is excellent. There are no pulmonary emboli. There is ordinary aortic atherosclerosis. No visible coronary artery calcification. Mediastinum/Nodes: No mediastinal or hilar adenopathy. Lungs/Pleura: Chronic bullous emphysema. Tension  pneumothorax on the right with mediastinal shift towards the left and depression of the right hemidiaphragm. Scattered areas of scarring and atelectasis in both lungs right more than left. Upper Abdomen: Negative Musculoskeletal: Old midthoracic compression fractures. No acute bone finding. Review of the MIP images confirms the above findings. IMPRESSION: Acute tension pneumothorax on the right in the setting of underlying bullous emphysema. Critical Value/emergent results were called by telephone at the time of interpretation on 09/04/2018 at 3:42 pm to [facility] , who verbally acknowledged these results. Electronically Signed   By: Nelson Chimes M.D.   On: 09/04/2018 15:43   Dg Chest Portable 1 View  Result Date: 09/04/2018 CLINICAL DATA:  Chest tube adjustment. EXAM: PORTABLE CHEST 1 VIEW COMPARISON:  09/04/2018 at 1646 hours FINDINGS: The patient is mildly rotated to the left. The right-sided chest tube has been withdrawn and now projects over the right hilum. Severe emphysema is again seen with large bulla in both apices. The right lung remains re-expanded without definite residual pneumothorax identified. No sizable pleural  effusion is seen. Postsurgical changes are noted in the left lung base. Chronic lung scarring/fibrotic changes are also noted bilaterally. IMPRESSION: Interval repositioning of right-sided chest tube without definite residual pneumothorax identified. Severe bullous emphysema. Electronically Signed   By: Logan Bores M.D.   On: 09/04/2018 20:17   Dg Chest Portable 1 View  Result Date: 09/04/2018 CLINICAL DATA:  Post chest tube insertion for right pneumothorax. EXAM: PORTABLE CHEST 1 VIEW COMPARISON:  Chest CT 09/04/2018 FINDINGS: Post right chest tube insertion. The tip of the chest tube crosses the midline into the left hemithorax. There right lung has reinflated. Bilateral upper lobe large bulla. Chronic emphysematous and fibrotic interstitial changes of bilateral lungs. The mediastinal structures are persistently displaced to the left. IMPRESSION: Status post right chest tube placement. The tip of the chest tube crosses the midline into the left hemithorax, position uncertain. Reinflation of the right lung. Severe bullous emphysematous changes of bilateral lungs. These results were called by telephone at the time of interpretation on 09/04/2018 at 5:14 pm to Dr. Dayna Barker, who verbally acknowledged these results. Electronically Signed   By: Fidela Salisbury M.D.   On: 09/04/2018 17:14     EKG:   Not done in ED, will get one.   Assessment/Plan Principal Problem:   Tension pneumothorax Active Problems:   Mycobacterium avium complex (HCC)   Invasive pulmonary aspergillosis (HCC)   Severe persistent asthma   Hypothyroidism   Iron deficiency anemia   Tension pneumothorax: Chest tube was placed stated by CT surgeon, Dr. Roxan Hockey, which is highly appreciated.  Right now patient has mild pain, but no respiratory distress. -Admitted to stepdown as inpatient -As needed Percocet and Dilaudid for pain - Mucinex for cough, PRN albuterol for shortness of breath -Follow-up CT surgeon's further  recommendations. -Keep patient n.p.o. in case patient needs more surgery.  Mycobacterium avium complex and invasive pulmonary aspergillosis ([place]): -continue home antibiotic meds -Continue prednisone 5 mg daily -Solu-Cortef 50 mg stress dose after collecting blood sample for cortisol level  Hx of Severe persistent asthma: No wheezing or rhonchi on auscultation. -Continue bronchodilators  Hypothyroidism: Last TSH was 1.07 on 08/11/2018 -Continue home Synthroid  Iron deficiency anemia: Hemoglobin stable 15 point 2P -Continue home iron supplement    DVT ppx: SCD Code Status: Full code Family Communication: None at bed side.   Disposition Plan:  Anticipate discharge back to previous home environment Consults called: [place], Dr. Roxan Hockey Admission status:  SDU/inpation       Date of Service 09/05/2018    Ivor Costa [facility] Pager [phone]  If 7PM-7AM, please contact night-coverage [email or web address] Password [email or web address] 09/05/2018, 7:19 AM

## 2018-09-04 NOTE — ED Notes (Signed)
Pt given coke and graham crackers per [place] from [employer].

## 2018-09-04 NOTE — ED Notes (Signed)
PA pulled chest tube back approximately 8 cm. Chest tube resecured.

## 2018-09-04 NOTE — Consult Note (Signed)
Reason for Consult:Right spontaneous pneumothorax Referring Physician: TH  [name] is an 42 y.o. male.  HPI: 42 yo man presented with cc/o SOB  [name] is a 42 yo man with a past history of MAI, bullous lung disease, left upper lobe segmentectomy for aspergilloma, reflux and hypothyroidism. He presented to [facility] today after noting onset of shortness of breath this morning. Sudden onset after coughing. Found to have a right spontaneous pneumothorax. CT placed by Dr. Dolly Rias in ED. Transferred to [facility] for further management.  Currently some discomfort at tube site, but shortness of breath resolved  Past Medical History:  Diagnosis Date  . 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[place])   . Drug rash 12/16/2017  . Esophageal reflux   . 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 Lung disease, bullous ([place])   . MAI (mycobacterium avium-intracellulare) ([place])   . Mold exposure 12/05/2015  . 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 Solar lentigo 08/08/2015  . Weight loss 10/15/2016    Past Surgical History:  Procedure Laterality Date  . left VATS  2012   thoracotomy and LUL apical posterior segmentectomy  . VIDEO BRONCHOSCOPY Bilateral 10/20/2015   Procedure: VIDEO BRONCHOSCOPY WITHOUT FLUORO;  Surgeon: Marshell Garfinkel, MD;  Location: [place];  Service: Cardiopulmonary;  Laterality: Bilateral;    Family History  Adopted: Yes    Social History:  reports that he quit smoking about 10 years ago. His smoking use included cigarettes. He has a 13.30 pack-year smoking history. He quit smokeless tobacco use about 23 years ago.  His smokeless tobacco use included snuff. He reports that he does not drink alcohol or use drugs.  Allergies:  Allergies  Allergen Reactions  . Clarithromycin Other (See Comments)    Adverse reaction w/ voriconazole  . Rifaximin Other (See Comments)    Adverse reaction w/ voriconazole    Medications:  Prior to Admission:  Medications Prior to Admission   Medication Sig Dispense Refill Last Dose  . albuterol (PROVENTIL HFA;VENTOLIN HFA) 108 (90 Base) MCG/ACT inhaler Inhale 2 puffs into the lungs every 6 (six) hours as needed. (Patient taking differently: Inhale 2 puffs into the lungs every 6 (six) hours as needed for wheezing or shortness of breath. ) 1 Inhaler 4 Taking  . azithromycin (ZITHROMAX) 500 MG tablet Take 1 tablet (500 mg total) by mouth daily. 30 tablet 11 Taking  . Calcium Carbonate-Vitamin D 600-400 MG-UNIT tablet Take by mouth.   Taking  . EPINEPHrine 0.3 mg/0.3 mL IJ SOAJ injection Inject 0.3 mLs (0.3 mg total) into the muscle once. 1 Device 11 Taking  . ethambutol (MYAMBUTOL) 400 MG tablet TAKE TWO AND ONE-HALF TABLETS BY MOUTH ONCE DAILY 225 tablet 5 Taking  . ferrous sulfate (FERRO-BOB) 325 (65 FE) MG tablet Take 325 mg by mouth daily.     Taking  . fluticasone furoate-vilanterol (BREO ELLIPTA) 100-25 MCG/INH AEPB Inhale 1 puff into the lungs daily. 1 each 5 Taking  . Isavuconazonium Sulfate 186 MG CAPS Take 2 capsules (372 mg total) by mouth daily. 60 capsule 11 Taking  . levothyroxine (SYNTHROID, LEVOTHROID) 75 MCG tablet Take 75 mcg by mouth daily.   0 Taking  . predniSONE (DELTASONE) 10 MG tablet Take 1 tablet (10 mg total) by mouth daily with breakfast. 14 tablet 0   . predniSONE (DELTASONE) 5 MG tablet Take 1 tablet (5 mg total) by mouth daily with breakfast. 90 tablet 1 Taking  . Tiotropium Bromide Monohydrate (SPIRIVA RESPIMAT) 2.5 MCG/ACT AERS Inhale 2 puffs  into the lungs daily. 1 Inhaler 5 Taking    Results for orders placed or performed during the hospital encounter of 09/04/18 (from the past 48 hour(s))  CBC with Differential     Status: Abnormal   Collection Time: 09/04/18  1:56 PM  Result Value Ref Range   WBC 10.2 4.0 - 10.5 K/uL   RBC 4.73 4.22 - 5.81 MIL/uL   Hemoglobin 15.2 13.0 - 17.0 g/dL   HCT 44.7 39.0 - 52.0 %   MCV 94.5 80.0 - 100.0 fL   MCH 32.1 26.0 - 34.0 pg   MCHC 34.0 30.0 - 36.0 g/dL   RDW  13.9 11.5 - 15.5 %   Platelets 187 150 - 400 K/uL   nRBC 0.0 0.0 - 0.2 %   Neutrophils Relative % 95 %   Neutro Abs 9.7 (H) 1.7 - 7.7 K/uL   Lymphocytes Relative 2 %   Lymphs Abs 0.2 (L) 0.7 - 4.0 K/uL   Monocytes Relative 3 %   Monocytes Absolute 0.3 0.1 - 1.0 K/uL   Eosinophils Relative 0 %   Eosinophils Absolute 0.0 0.0 - 0.5 K/uL   Basophils Relative 0 %   Basophils Absolute 0.0 0.0 - 0.1 K/uL   Immature Granulocytes 0 %   Abs Immature Granulocytes 0.02 0.00 - 0.07 K/uL    Comment: Performed at [facility], [address] [place], [place] [address]  Comprehensive metabolic panel     Status: Abnormal   Collection Time: 09/04/18  1:56 PM  Result Value Ref Range   Sodium 137 135 - 145 mmol/L   Potassium 3.6 3.5 - 5.1 mmol/L   Chloride 102 98 - 111 mmol/L   CO2 25 22 - 32 mmol/L   Glucose, Bld 140 (H) 70 - 99 mg/dL   BUN 9 6 - 20 mg/dL   Creatinine, Ser 0.70 0.61 - 1.24 mg/dL   Calcium 8.6 (L) 8.9 - 10.3 mg/dL   Total Protein 7.3 6.5 - 8.1 g/dL   Albumin 3.9 3.5 - 5.0 g/dL   AST 24 15 - 41 U/L   ALT 17 0 - 44 U/L   Alkaline Phosphatase 93 38 - 126 U/L   Total Bilirubin 0.7 0.3 - 1.2 mg/dL   GFR calc non Af Amer >60 >60 mL/min   GFR calc Af Amer >60 >60 mL/min    Comment: (NOTE) The eGFR has been calculated using the CKD EPI equation. This calculation has not been validated in all clinical situations. eGFR's persistently <60 mL/min signify possible Chronic Kidney Disease.    Anion gap 10 5 - 15    Comment: Performed at [facility], [address] [place], [place] [address]    Dg Chest 1 View  Result Date: 09/04/2018 CLINICAL DATA:  Status post chest tube EXAM: CHEST  1 VIEW COMPARISON:  AP portable view of the chest performed at 1646 hours, chest CT 09/04/2018 and 10/23/2016 FINDINGS: A lateral view of the chest is provided in conjunction with previously acquired frontal view of the chest. Pigtail catheter projects anteriorly in the substernal  portion of the chest. Hyperinflated emphysematous appearance of the superimposed lung apices. Moderate chronic midthoracic compression deformities accentuating thoracic kyphosis. IMPRESSION: Bullous emphysematous changes at the apex of the lungs superimposed upon each other on the lateral view. Pigtail catheter projects within the retrosternal space this single lateral view. Apparent re-expansion of previously noted right-sided pneumothorax. Electronically Signed   By: Meredith Leeds.D.  On: 09/04/2018 17:51   Ct Angio Chest Pe W And/or Wo Contrast  Result Date: 09/04/2018 CLINICAL DATA:  Worsening cough over the last 2 days. Shortness of breath beginning today. Chronic lung disease. EXAM: CT ANGIOGRAPHY CHEST WITH CONTRAST TECHNIQUE: Multidetector CT imaging of the chest was performed using the standard protocol during bolus administration of intravenous contrast. Multiplanar CT image reconstructions and MIPs were obtained to evaluate the vascular anatomy. CONTRAST:  164m ISOVUE-370 IOPAMIDOL (ISOVUE-370) INJECTION 76% COMPARISON:  Chest radiography 08/11/2018.  CT 08/09/2017. FINDINGS: Cardiovascular: Pulmonary arterial opacification is excellent. There are no pulmonary emboli. There is ordinary aortic atherosclerosis. No visible coronary artery calcification. Mediastinum/Nodes: No mediastinal or hilar adenopathy. Lungs/Pleura: Chronic bullous emphysema. Tension pneumothorax on the right with mediastinal shift towards the left and depression of the right hemidiaphragm. Scattered areas of scarring and atelectasis in both lungs right more than left. Upper Abdomen: Negative Musculoskeletal: Old midthoracic compression fractures. No acute bone finding. Review of the MIP images confirms the above findings. IMPRESSION: Acute tension pneumothorax on the right in the setting of underlying bullous emphysema. Critical Value/emergent results were called by telephone at the time of interpretation on 09/04/2018 at 3:42  pm [facility] verbally acknowledged these results. Electronically Signed   By: MNelson ChimesM.D.   On: 09/04/2018 15:43   Dg Chest Portable 1 View  Result Date: 09/04/2018 CLINICAL DATA:  Chest tube adjustment. EXAM: PORTABLE CHEST 1 VIEW COMPARISON:  09/04/2018 at 1646 hours FINDINGS: The patient is mildly rotated to the left. The right-sided chest tube has been withdrawn and now projects over the right hilum. Severe emphysema is again seen with large bulla in both apices. The right lung remains re-expanded without definite residual pneumothorax identified. No sizable pleural effusion is seen. Postsurgical changes are noted in the left lung base. Chronic lung scarring/fibrotic changes are also noted bilaterally. IMPRESSION: Interval repositioning of right-sided chest tube without definite residual pneumothorax identified. Severe bullous emphysema. Electronically Signed   By: ALogan BoresM.D.   On: 09/04/2018 20:17   Dg Chest Portable 1 View  Result Date: 09/04/2018 CLINICAL DATA:  Post chest tube insertion for right pneumothorax. EXAM: PORTABLE CHEST 1 VIEW COMPARISON:  Chest CT 09/04/2018 FINDINGS: Post right chest tube insertion. The tip of the chest tube crosses the midline into the left hemithorax. There right lung has reinflated. Bilateral upper lobe large bulla. Chronic emphysematous and fibrotic interstitial changes of bilateral lungs. The mediastinal structures are persistently displaced to the left. IMPRESSION: Status post right chest tube placement. The tip of the chest tube crosses the midline into the left hemithorax, position uncertain. Reinflation of the right lung. Severe bullous emphysematous changes of bilateral lungs. These results were called by telephone at the time of interpretation on 09/04/2018 at 5:14 pm to Dr. MDayna Barker who verbally acknowledged these results. Electronically Signed   By: DFidela SalisburyM.D.   On: 09/04/2018 17:14   I personally reviewed the CXR  images and concur with the findings noted above  Review of Systems  Constitutional: Negative for chills and fever.  Respiratory: Positive for cough and shortness of breath.   Cardiovascular: Negative for chest pain.   Blood pressure 124/75, pulse 63, temperature 98 F (36.7 C), temperature source Oral, resp. rate 15, height _0  (1.702 m), weight 51.3 kg, SpO2 100 %. Physical Exam  Vitals reviewed. Constitutional: He is oriented to person, place, and time. No distress.  Thin  HENT:  Head: Normocephalic and atraumatic.  Mouth/Throat:  No oropharyngeal exudate.  Eyes: Conjunctivae and EOM are normal. No scleral icterus.  Neck: Neck supple. No thyromegaly present.  Cardiovascular: Normal rate, regular rhythm and normal heart sounds. Exam reveals no gallop and no friction rub.  No murmur heard. Respiratory: Effort normal and breath sounds normal. No respiratory distress. He has no wheezes. He has no rales.  Chest tub in place- no air leak  GI: Soft. He exhibits no distension. There is no tenderness.  Lymphadenopathy:    He has no cervical adenopathy.  Neurological: He is alert and oriented to person, place, and time. A cranial nerve deficit is present. He exhibits normal muscle tone.  Skin: Skin is warm and dry. He is not diaphoretic.    Assessment/Plan: 42 yo man with chronic MAI infection with bullous lung disease. Presented with a right spontaneous pneumothorax. CT placed in ED. Lung has reexpanded. There is no air leak at present.  Keep tube to suction overnight  Antibiotics for MAI per Medicine team  Melrose Nakayama 09/04/2018, 9:34 PM

## 2018-09-04 NOTE — ED Notes (Signed)
[facility] here for transport.

## 2018-09-04 NOTE — Telephone Encounter (Signed)
Called pt had to lmomtcb. Pt. Can go ahead and start xolair. (Just a reminder,Pt has xolair on hand.) He'll just need to wait 2 hrs., since it's been 7 months since he had his Xolair.  To start Nucala pt just needs to call [facility] and give them his co-pay card # , then they can charge the med to his co-pay card, and we'll be able to order pt's Nucala. Routing to MR as a FYI.

## 2018-09-04 NOTE — ED Provider Notes (Addendum)
  Physical Exam  BP 137/87   Pulse 100   Temp 98 F (36.7 C) (Oral)   Resp (!) 22   Ht _0  (1.702 m)   Wt 51.3 kg   SpO2 (!) 88%   BMI 17.70 kg/m   Physical Exam  Constitutional: He appears well-developed and well-nourished.  Cardiovascular: Regular rhythm. Tachycardia present.  Pulmonary/Chest: Tachypnea noted. He has decreased breath sounds.  hypoxia  Nursing note and vitals reviewed.   ED Course/Procedures   Clinical Course as of Sep 06 851  Thu Sep 04, 2018  1950 Resp(!): 22 [ES]    Clinical Course User Index [ES] Glyn Ade, [place]    CHEST TUBE INSERTION Date/Time: 09/04/2018 5:11 PM Performed by: Merrily Pew, MD Authorized by: Merrily Pew, MD   Consent:    Consent obtained:  Verbal and emergent situation Pre-procedure details:    Skin preparation:  Betadine   Preparation: Patient was prepped and draped in the usual sterile fashion   Anesthesia (see MAR for exact dosages):    Anesthesia method:  Local infiltration   Local anesthetic:  Lidocaine 1% WITH epi Procedure details:    Placement location:  R lateral   Scalpel size:  11   Ultrasound guidance: yes     Tension pneumothorax: yes     Tube connected to:  Suction and water seal   Drainage characteristics:  Air only   Suture material:  0 silk   Dressing: biopatch and tegaderm. Post-procedure details:    Patient tolerance of procedure:  Tolerated well, no immediate complications Comments:     Ap xray shows the chest tube across midline but improved lung aeration. Patient clinically improved. No obvious resistance or difficulty placing wire or dilator or blood output to suggest mediastinal placement/injury. Discussed with Radiology who recommends lateral CXR to further evaluate. .Critical Care Performed by: Merrily Pew, MD Authorized by: Merrily Pew, MD   Critical care provider statement:    Critical care time (minutes):  45   Critical care was time spent personally by me on the  following activities:  Discussions with consultants, evaluation of patient's response to treatment, examination of patient, ordering and performing treatments and interventions, ordering and review of laboratory studies, ordering and review of radiographic studies, pulse oximetry, re-evaluation of patient's condition, obtaining history from patient or surrogate and review of old charts   I assumed direction of critical care for this patient from another provider in my specialty: no      MDM  Patient found to have tension pneumothorax on CT scan done by physician assistant prior to my evaluation.  Chest tube placed by myself as per above.  X-ray with abnormal placement, after discussion by myself with the radiologist we will do a lateral.  If posterior we will just withdrawal by 6 to 7 cm however if in the mediastinum will leave it and discuss with CT surgery.  Clinically improved at this point and no obvious signs of malplacement.    Merrily Pew, MD 09/05/18 [phone]

## 2018-09-04 NOTE — ED Notes (Signed)
Pt. Back from CT scan and will be having a chest tube insertion due to tension pneumo.Marland KitchenMarland Kitchen

## 2018-09-04 NOTE — ED Notes (Signed)
Verbal order received from [place], [place] for pain and nausea medication.

## 2018-09-04 NOTE — ED Notes (Signed)
Chest tube noted with no noted distress in Pt.

## 2018-09-05 ENCOUNTER — Inpatient Hospital Stay (HOSPITAL_COMMUNITY): Payer: BLUE CROSS/BLUE SHIELD

## 2018-09-05 DIAGNOSIS — E43 Unspecified severe protein-calorie malnutrition: Secondary | ICD-10-CM

## 2018-09-05 LAB — BASIC METABOLIC PANEL
Anion gap: 6 (ref 5–15)
BUN: <5 mg/dL — ABNORMAL LOW (ref 6–20)
CALCIUM: 8.4 mg/dL — AB (ref 8.9–10.3)
CO2: 29 mmol/L (ref 22–32)
CREATININE: 0.74 mg/dL (ref 0.61–1.24)
Chloride: 104 mmol/L (ref 98–111)
GFR calc Af Amer: >60 mL/min (ref >60)
GFR calc non Af Amer: >60 mL/min (ref >60)
Glucose, Bld: 121 mg/dL — ABNORMAL HIGH (ref 70–99)
Potassium: 3.6 mmol/L (ref 3.5–5.1)
Sodium: 139 mmol/L (ref 135–145)

## 2018-09-05 LAB — CBC
HCT: 41.4 % (ref 39.0–52.0)
Hemoglobin: 13.6 g/dL (ref 13.0–17.0)
MCH: 31.4 pg (ref 26.0–34.0)
MCHC: 32.9 g/dL (ref 30.0–36.0)
MCV: 95.6 fL (ref 80.0–100.0)
NRBC: 0 % (ref 0.0–0.2)
PLATELETS: 195 10*3/uL (ref 150–400)
RBC: 4.33 MIL/uL (ref 4.22–5.81)
RDW: 14 % (ref 11.5–15.5)
WBC: 8.9 10*3/uL (ref 4.0–10.5)

## 2018-09-05 LAB — PROTIME-INR
INR: 1.14
Prothrombin Time: 14.5 seconds (ref 11.4–15.2)

## 2018-09-05 LAB — MRSA PCR SCREENING: MRSA by PCR: NEGATIVE

## 2018-09-05 LAB — HIV ANTIBODY (ROUTINE TESTING W REFLEX): HIV SCREEN 4TH GENERATION: NONREACTIVE

## 2018-09-05 LAB — CORTISOL-AM, BLOOD: Cortisol - AM: 11.6 ug/dL (ref 6.7–22.6)

## 2018-09-05 LAB — APTT: APTT: 37 s — AB (ref 24–36)

## 2018-09-05 MED ORDER — CALCIUM CARBONATE-VITAMIN D 500-200 MG-UNIT PO TABS
1.0000 | ORAL_TABLET | Freq: Every day | ORAL | Status: DC
  Administered 2018-09-05 – 2018-09-11 (×7): 1 via ORAL
  Filled 2018-09-05 (×7): qty 1

## 2018-09-05 MED ORDER — TIOTROPIUM BROMIDE MONOHYDRATE 18 MCG IN CAPS
18.0000 ug | ORAL_CAPSULE | Freq: Every day | RESPIRATORY_TRACT | Status: DC
  Filled 2018-09-05: qty 5

## 2018-09-05 MED ORDER — SENNOSIDES-DOCUSATE SODIUM 8.6-50 MG PO TABS: 1.0000 | ORAL_TABLET | Freq: Every evening | ORAL | Status: DC | PRN

## 2018-09-05 MED ORDER — ENSURE ENLIVE PO LIQD: 237.0000 mL | Freq: Two times a day (BID) | ORAL | Status: DC

## 2018-09-05 MED ORDER — AZITHROMYCIN 250 MG PO TABS
500.0000 mg | ORAL_TABLET | Freq: Every day | ORAL | Status: DC
  Administered 2018-09-05 – 2018-09-11 (×7): 500 mg via ORAL
  Filled 2018-09-05 (×7): qty 2

## 2018-09-05 MED ORDER — ISAVUCONAZONIUM SULFATE 186 MG PO CAPS
372.0000 mg | ORAL_CAPSULE | Freq: Every day | ORAL | Status: DC
  Administered 2018-09-05 – 2018-10-05 (×29): 372 mg via ORAL
  Filled 2018-09-05 (×35): qty 2

## 2018-09-05 MED ORDER — FERROUS SULFATE 325 (65 FE) MG PO TABS
325.0000 mg | ORAL_TABLET | Freq: Every day | ORAL | Status: DC
  Administered 2018-09-05 – 2018-09-11 (×7): 325 mg via ORAL
  Filled 2018-09-05 (×7): qty 1

## 2018-09-05 MED ORDER — HYDROCORTISONE NA SUCCINATE PF 100 MG IJ SOLR
50.0000 mg | Freq: Once | INTRAMUSCULAR | Status: AC
  Administered 2018-09-05: 50 mg via INTRAVENOUS
  Filled 2018-09-05: qty 2

## 2018-09-05 MED ORDER — UMECLIDINIUM BROMIDE 62.5 MCG/INH IN AEPB
1.0000 | INHALATION_SPRAY | Freq: Every day | RESPIRATORY_TRACT | Status: DC
  Administered 2018-09-05 – 2018-09-27 (×21): 1 via RESPIRATORY_TRACT
  Filled 2018-09-05 (×5): qty 7

## 2018-09-05 MED ORDER — LEVOTHYROXINE SODIUM 75 MCG PO TABS
75.0000 ug | ORAL_TABLET | Freq: Every day | ORAL | Status: DC
  Administered 2018-09-05 – 2018-10-06 (×32): 75 ug via ORAL
  Filled 2018-09-05 (×2): qty 1
  Filled 2018-09-05: qty 3
  Filled 2018-09-05 (×29): qty 1

## 2018-09-05 MED ORDER — ETHAMBUTOL HCL 400 MG PO TABS
1000.0000 mg | ORAL_TABLET | Freq: Every day | ORAL | Status: DC
  Administered 2018-09-05 – 2018-10-06 (×31): 1000 mg via ORAL
  Filled 2018-09-05 (×5): qty 2
  Filled 2018-09-05: qty 2.5
  Filled 2018-09-05 (×3): qty 2
  Filled 2018-09-05 (×2): qty 2.5
  Filled 2018-09-05: qty 2
  Filled 2018-09-05: qty 2.5
  Filled 2018-09-05 (×9): qty 2
  Filled 2018-09-05: qty 2.5
  Filled 2018-09-05 (×3): qty 2
  Filled 2018-09-05: qty 2.5
  Filled 2018-09-05: qty 2
  Filled 2018-09-05 (×2): qty 2.5
  Filled 2018-09-05 (×2): qty 2

## 2018-09-05 MED ORDER — PREDNISONE 5 MG PO TABS
5.0000 mg | ORAL_TABLET | Freq: Every day | ORAL | Status: DC
  Administered 2018-09-05: 5 mg via ORAL
  Filled 2018-09-05: qty 1

## 2018-09-05 MED ORDER — FLUTICASONE FUROATE-VILANTEROL 100-25 MCG/INH IN AEPB
1.0000 | INHALATION_SPRAY | Freq: Every day | RESPIRATORY_TRACT | Status: DC
  Administered 2018-09-05 – 2018-10-06 (×29): 1 via RESPIRATORY_TRACT
  Filled 2018-09-05 (×3): qty 28

## 2018-09-05 MED ORDER — ENSURE ENLIVE PO LIQD
237.0000 mL | Freq: Three times a day (TID) | ORAL | Status: DC
  Administered 2018-09-05 – 2018-09-30 (×46): 237 mL

## 2018-09-05 NOTE — Progress Notes (Addendum)
[address]       [place] [id number]             [phone]      Subjective:  Complains of pain at chest tube site once pain medication wears off.  Objective: Vital signs in last 24 hours: Temp:  [97.5 F (36.4 C)-98.2 F (36.8 C)] 97.5 F (36.4 C) (10/18 0351) Pulse Rate:  [57-100] 68 (10/18 1213) Cardiac Rhythm: Sinus bradycardia (10/18 0800) Resp:  [8-22] 22 (10/18 1213) BP: (98-137)/(66-87) 99/67 (10/18 1213) SpO2:  [88 %-100 %] 97 % (10/18 1213) Weight:  [51.3 kg] 51.3 kg (10/17 1330)  Intake/Output from previous day: 10/17 0701 - 10/18 0700 In: 300 [P.O.:300] Out: 225 [Urine:225]  General appearance: alert, cooperative and no distress Heart: regular rate and rhythm Lungs: clear to auscultation bilaterally Abdomen: soft, non-tender; bowel sounds normal; no masses,  no organomegaly Wound: clean and dry  Lab Results: Recent Labs    09/04/18 1356 09/05/18 0229  WBC 10.2 8.9  HGB 15.2 13.6  HCT 44.7 41.4  PLT 187 195   BMET:  Recent Labs    09/04/18 1356 09/05/18 0229  NA 137 139  K 3.6 3.6  CL 102 104  CO2 25 29  GLUCOSE 140* 121*  BUN 9 <5*  CREATININE 0.70 0.74  CALCIUM 8.6* 8.4*    PT/INR:  Recent Labs    09/05/18 0229  LABPROT 14.5  INR 1.14   ABG    Component Value Date/Time   PHART 7.393 12/23/2010 0408   HCO3 28.5 (H) 12/23/2010 0408   TCO2 26 10/13/2015 0420   ACIDBASEDEF 2.0 12/22/2010 1522   O2SAT 98.0 12/23/2010 0408   CBG (last 3)  No results for input(s): GLUCAP in the last 72 hours.  Assessment/Plan:  1. Tension Pneumothorax- S/P chest tube placement by ED physician- no air leak present, will leave chest tube on suction today 2. Pulm -no acute issues, CXR is free from pneumothorax, + bullous emphysema 3. Dispo- patient stable, leave chest tube on suction today, care per medicine   LOS: 1 day    Ellwood Handler 09/05/2018 Patient seen and examined, agree with above If CXR OK in AM will  place CT to water seal  [place] C. Roxan Hockey, MD [facility] [phone]

## 2018-09-05 NOTE — Progress Notes (Addendum)
Initial Nutrition Assessment  DOCUMENTATION CODES:   Severe malnutrition in context of chronic illness, Underweight  INTERVENTION:    Ensure Enlive po TID, each supplement provides 350 kcal and 20 grams of protein  NUTRITION DIAGNOSIS:   Severe Malnutrition related to chronic illness(bullous lung disease ) as evidenced by severe fat depletion, severe muscle depletion  GOAL:   Patient will meet greater than or equal to 90% of their needs  MONITOR:   PO intake, Supplement acceptance, Labs, Skin, Weight trends, I & O's  REASON FOR ASSESSMENT:   Malnutrition Screening Tool  ASSESSMENT:   42 yo Male with PMH of MAI, bullous lung disease, left upper lobe segmentectomy for aspergilloma, reflux and hypothyroidism. He presented to [facility] today after noting onset of shortness of breath.   RD spoke with Mr. Zeimet at bedside. He is pleasant. He reports he tries to consume at least 2 meals per day. Typically it is a sub sandwich & chips or takeout from a restaurant.   Pt was drinking 1-2 Great Value nutrition supplements PTA. He recently ran out; was planning to go to [place] to purchase more. Pt also reveals his antifungal medication suppresses his appetite.  He endorses a 15 lb weight loss since beginning of August 2019. Per weight readings, pt has had a 5% weight loss since July 2019.  Not significant for time frame.  Would like Ensure Enlive during acute hospitalization.  Labs & medications reviewed.  NUTRITION - FOCUSED PHYSICAL EXAM:    Most Recent Value  Orbital Region  No depletion  Upper Arm Region  Moderate depletion  Thoracic and Lumbar Region  Moderate depletion  Buccal Region  No depletion  Temple Region  Moderate depletion  Clavicle Bone Region  Severe depletion  Clavicle and Acromion Bone Region  Severe depletion  Scapular Bone Region  Severe depletion  Dorsal Hand  Moderate depletion  Patellar Region  Severe depletion  Anterior Thigh Region  Severe depletion   Posterior Calf Region  Severe depletion  Edema (RD Assessment)  None    Diet Order:   Diet Order            Diet regular Room service appropriate? Yes; Fluid consistency: Thin  Diet effective now             EDUCATION NEEDS:   No education needs have been identified at this time  Skin:  Skin Assessment: Reviewed RN Assessment  Last BM:  10/17  Height:   Ht Readings from Last 1 Encounters:  09/04/18 _0  (1.702 m)   Weight:   Wt Readings from Last 1 Encounters:  09/04/18 51.3 kg   Wt Readings from Last 10 Encounters:  09/04/18 51.3 kg  08/11/18 52.3 kg  06/17/18 58.5 kg  01/27/18 54.9 kg  12/16/17 56.7 kg  10/22/17 56.4 kg  09/16/17 56.2 kg  08/26/17 56.8 kg  08/09/17 55.5 kg  06/25/17 59 kg   BMI:  Body mass index is 17.7 kg/m.  Estimated Nutritional Needs:   Kcal:  1700-1900  Protein:  80-95 gm  Fluid:  1.7-1.9 L  Arthur Holms, RD, LDN Pager #: [phone] After-Hours Pager #: [phone]

## 2018-09-05 NOTE — Progress Notes (Signed)
PROGRESS NOTE    UTAH Farmer  TDD:[id number] DOB: July 17, 1976 DOA: 09/04/2018 PCP: Wallene Dales, MD  Brief Narrative: [name] is a 42 y.o. male with medical history significant of asthma, GERD, hypothyroidism, aspergilloma, left upper lobe segmentectomy for aspergilloma/MAI, lung bullous lung disease, who presented with shortness of breath. -In the emergency room he was noted to be hypoxic, CT chest showed tension pneumothorax with bullous emphysematous changes, CVTS was consulted right-sided chest tube was placed urgently last night   Assessment & Plan:    Tension pneumothorax:  -Chest tube placed urgently last night 10/17 per Dr. Roxan Hockey -Repeat chest x-ray this morning with no pneumothorax -Per CT surgery, tube remains to suction, no air leak -Pain control -This is his first episode of pneumothorax, due to severe bullous lung disease will be at risk for recurrence  History of MAI -Continue home regimen of ethambutol and azithromycin -Followed by infectious disease  History of aspergilloma -Remote left upper lobe segmentectomy -Continue antifungal, cressemba -Followed by [place] [facility] and infectious disease for this -Continue prednisone 5 mg daily  Hx of Severe persistent asthma: No wheezing or rhonchi on auscultation. -Continue bronchodilators  Hypothyroidism -Continue Synthroid  Iron deficiency anemia: -Continue home iron supplement  DVT ppx: SCDs Code Status: Full code Family Communication: None at bed side.   Disposition Plan:  Anticipate discharge back to previous home environment   Consults :  CT surgeon, Dr. Roxan Hockey     Procedures: Chest tube placed 10/17  Antimicrobials:    Subjective: -Continues to have mild pain/discomfort the chest tube site  Objective: Vitals:   09/05/18 0351 09/05/18 0729 09/05/18 0838 09/05/18 1213  BP: 98/66 98/66 102/67 99/67  Pulse: (!) 57 64 (!) 57 68  Resp: 15 13 (!) 8 (!) 22  Temp: (!) 97.5 F  (36.4 C)     TempSrc: Oral     SpO2: 100% 100% 100% 97%  Weight:      Height:        Intake/Output Summary (Last 24 hours) at 09/05/2018 1248 Last data filed at 09/05/2018 0800 Gross per 24 hour  Intake 300 ml  Output 225 ml  Net 75 ml   Filed Weights   09/04/18 1330  Weight: 51.3 kg    Examination:  General exam: Frail cachectic male laying in bed, no distress Respiratory system: Scattered rhonchi Cardiovascular system: S1 & S2 heard, RRR Gastrointestinal system: Abdomen is nondistended, soft and nontender.Normal bowel sounds heard. Central nervous system: Alert and oriented. No focal neurological deficits. Extremities: no Edema Skin: No rashes Psychiatry: Judgement and insight appear normal. Mood & affect appropriate.     Data Reviewed:   CBC: Recent Labs  Lab 09/04/18 1356 09/05/18 0229  WBC 10.2 8.9  NEUTROABS 9.7*  --   HGB 15.2 13.6  HCT 44.7 41.4  MCV 94.5 95.6  PLT 187 623   Basic Metabolic Panel: Recent Labs  Lab 09/04/18 1356 09/05/18 0229  NA 137 139  K 3.6 3.6  CL 102 104  CO2 25 29  GLUCOSE 140* 121*  BUN 9 <5*  CREATININE 0.70 0.74  CALCIUM 8.6* 8.4*   GFR: Estimated Creatinine Clearance: 85.5 mL/min (by C-G formula based on SCr of 0.74 mg/dL). Liver Function Tests: Recent Labs  Lab 09/04/18 1356  AST 24  ALT 17  ALKPHOS 93  BILITOT 0.7  PROT 7.3  ALBUMIN 3.9   No results for input(s): LIPASE, AMYLASE in the last 168 hours. No results for input(s): AMMONIA in the  last 168 hours. Coagulation Profile: Recent Labs  Lab 09/05/18 0229  INR 1.14   Cardiac Enzymes: No results for input(s): CKTOTAL, CKMB, CKMBINDEX, TROPONINI in the last 168 hours. BNP (last 3 results) No results for input(s): PROBNP in the last 8760 hours. HbA1C: No results for input(s): HGBA1C in the last 72 hours. CBG: No results for input(s): GLUCAP in the last 168 hours. Lipid Profile: No results for input(s): CHOL, HDL, LDLCALC, TRIG, CHOLHDL,  LDLDIRECT in the last 72 hours. Thyroid Function Tests: No results for input(s): TSH, T4TOTAL, FREET4, T3FREE, THYROIDAB in the last 72 hours. Anemia Panel: No results for input(s): VITAMINB12, FOLATE, FERRITIN, TIBC, IRON, RETICCTPCT in the last 72 hours. Urine analysis:    Component Value Date/Time   COLORURINE YELLOW 05/28/2011 [place] 05/28/2011 0943   LABSPEC 1.021 05/28/2011 0943   PHURINE 6.0 05/28/2011 0943   GLUCOSEU NEG 05/28/2011 0943   HGBUR NEG 05/28/2011 0943   BILIRUBINUR NEG 05/28/2011 0943   KETONESUR NEG 05/28/2011 0943   PROTEINUR NEG 05/28/2011 0943   UROBILINOGEN 0.2 05/28/2011 0943   NITRITE NEG 05/28/2011 0943   LEUKOCYTESUR NEG 05/28/2011 0943   Sepsis Labs: _0 (procalcitonin:4,lacticidven:4)  ) Recent Results (from the past 240 hour(s))  MRSA PCR Screening     Status: None   Collection Time: 09/04/18  9:46 PM  Result Value Ref Range Status   MRSA by PCR NEGATIVE NEGATIVE Final    Comment:        The GeneXpert MRSA Assay (FDA approved for NASAL specimens only), is one component of a comprehensive MRSA colonization surveillance program. It is not intended to diagnose MRSA infection nor to guide or monitor treatment for MRSA infections. Performed at [facility], [place] [address] [place], [place] [address]          Radiology Studies: Dg Chest 1 View  Result Date: 09/04/2018 CLINICAL DATA:  Status post chest tube EXAM: CHEST  1 VIEW COMPARISON:  AP portable view of the chest performed at 1646 hours, chest CT 09/04/2018 and 10/23/2016 FINDINGS: A lateral view of the chest is provided in conjunction with previously acquired frontal view of the chest. Pigtail catheter projects anteriorly in the substernal portion of the chest. Hyperinflated emphysematous appearance of the superimposed lung apices. Moderate chronic midthoracic compression deformities accentuating thoracic kyphosis. IMPRESSION: Bullous emphysematous  changes at the apex of the lungs superimposed upon each other on the lateral view. Pigtail catheter projects within the retrosternal space this single lateral view. Apparent re-expansion of previously noted right-sided pneumothorax. Electronically Signed   By: Ashley Royalty M.D.   On: 09/04/2018 17:51   Ct Angio Chest Pe W And/or Wo Contrast  Result Date: 09/04/2018 CLINICAL DATA:  Worsening cough over the last 2 days. Shortness of breath beginning today. Chronic lung disease. EXAM: CT ANGIOGRAPHY CHEST WITH CONTRAST TECHNIQUE: Multidetector CT imaging of the chest was performed using the standard protocol during bolus administration of intravenous contrast. Multiplanar CT image reconstructions and MIPs were obtained to evaluate the vascular anatomy. CONTRAST:  149m ISOVUE-370 IOPAMIDOL (ISOVUE-370) INJECTION 76% COMPARISON:  Chest radiography 08/11/2018.  CT 08/09/2017. FINDINGS: Cardiovascular: Pulmonary arterial opacification is excellent. There are no pulmonary emboli. There is ordinary aortic atherosclerosis. No visible coronary artery calcification. Mediastinum/Nodes: No mediastinal or hilar adenopathy. Lungs/Pleura: Chronic bullous emphysema. Tension pneumothorax on the right with mediastinal shift towards the left and depression of the right hemidiaphragm. Scattered areas of scarring and atelectasis in both lungs right more than left. Upper  Abdomen: Negative Musculoskeletal: Old midthoracic compression fractures. No acute bone finding. Review of the MIP images confirms the above findings. IMPRESSION: Acute tension pneumothorax on the right in the setting of underlying bullous emphysema. Critical Value/emergent results were called by telephone at the time of interpretation on 09/04/2018 at 3:42 pm to [facility] , who verbally acknowledged these results. Electronically Signed   By: Nelson Chimes M.D.   On: 09/04/2018 15:43   Dg Chest Port 1 View  Result Date: 09/05/2018 CLINICAL DATA:  Follow-up  pneumothorax EXAM: PORTABLE CHEST 1 VIEW COMPARISON:  09/04/2018 FINDINGS: Cardiac shadow is stable. Chronic scarring is noted in the upper lobes with bullous formation. The pigtail chest tube is again identified and stable. No significant pneumothorax is noted. No new focal infiltrate is seen. IMPRESSION: Bullous emphysema stable from previous exams. Chest tube in place without evidence of pneumothorax. Electronically Signed   By: Inez Catalina M.D.   On: 09/05/2018 08:10   Dg Chest Portable 1 View  Result Date: 09/04/2018 CLINICAL DATA:  Chest tube adjustment. EXAM: PORTABLE CHEST 1 VIEW COMPARISON:  09/04/2018 at 1646 hours FINDINGS: The patient is mildly rotated to the left. The right-sided chest tube has been withdrawn and now projects over the right hilum. Severe emphysema is again seen with large bulla in both apices. The right lung remains re-expanded without definite residual pneumothorax identified. No sizable pleural effusion is seen. Postsurgical changes are noted in the left lung base. Chronic lung scarring/fibrotic changes are also noted bilaterally. IMPRESSION: Interval repositioning of right-sided chest tube without definite residual pneumothorax identified. Severe bullous emphysema. Electronically Signed   By: Logan Bores M.D.   On: 09/04/2018 20:17   Dg Chest Portable 1 View  Result Date: 09/04/2018 CLINICAL DATA:  Post chest tube insertion for right pneumothorax. EXAM: PORTABLE CHEST 1 VIEW COMPARISON:  Chest CT 09/04/2018 FINDINGS: Post right chest tube insertion. The tip of the chest tube crosses the midline into the left hemithorax. There right lung has reinflated. Bilateral upper lobe large bulla. Chronic emphysematous and fibrotic interstitial changes of bilateral lungs. The mediastinal structures are persistently displaced to the left. IMPRESSION: Status post right chest tube placement. The tip of the chest tube crosses the midline into the left hemithorax, position uncertain.  Reinflation of the right lung. Severe bullous emphysematous changes of bilateral lungs. These results were called by telephone at the time of interpretation on 09/04/2018 at 5:14 pm to Dr. Dayna Barker, who verbally acknowledged these results. Electronically Signed   By: Fidela Salisbury M.D.   On: 09/04/2018 17:14        Scheduled Meds: . azithromycin  500 mg Oral Daily  . calcium-vitamin D  1 tablet Oral Daily  . ethambutol  1,000 mg Oral Daily  . ferrous sulfate  325 mg Oral Daily  . fluticasone furoate-vilanterol  1 puff Inhalation Daily  . Isavuconazonium Sulfate  372 mg Oral Daily  . levothyroxine  75 mcg Oral QAC breakfast  . predniSONE  5 mg Oral Q breakfast  . umeclidinium bromide  1 puff Inhalation Daily   Continuous Infusions:   LOS: 1 day    Time spent: 76mn    PDomenic Polite MD [facility] Page via [email or web address], password [email or web address] After 7PM please contact night-coverage  09/05/2018, 12:48 PM

## 2018-09-06 ENCOUNTER — Inpatient Hospital Stay (HOSPITAL_COMMUNITY): Payer: BLUE CROSS/BLUE SHIELD

## 2018-09-06 MED ORDER — POLYETHYLENE GLYCOL 3350 17 G PO PACK
17.0000 g | PACK | Freq: Every day | ORAL | Status: DC | PRN
  Administered 2018-09-06: 17 g via ORAL
  Filled 2018-09-06: qty 1

## 2018-09-06 MED ORDER — PREDNISONE 10 MG PO TABS
5.0000 mg | ORAL_TABLET | Freq: Every day | ORAL | Status: DC
  Administered 2018-09-06 – 2018-10-06 (×31): 5 mg via ORAL
  Filled 2018-09-06 (×31): qty 1

## 2018-09-06 NOTE — Plan of Care (Signed)
Discussed with patient plan of care for the evening, pain management and importance of not waiting til it becomes a 10/10 with some teach back displayed.  Discussed the difference in IV versus by mouth medication for pain as well.

## 2018-09-06 NOTE — Progress Notes (Addendum)
[address]       [place] [address]             [phone]      Subjective:  No new complaints.    Objective: Vital signs in last 24 hours: Temp:  [97.4 F (36.3 C)-98.4 F (36.9 C)] 97.6 F (36.4 C) (10/19 0828) Pulse Rate:  [53-85] 85 (10/19 0828) Cardiac Rhythm: Sinus bradycardia (10/19 0700) Resp:  [11-22] 18 (10/19 0828) BP: (90-118)/(62-87) 118/87 (10/19 0828) SpO2:  [95 %-100 %] 98 % (10/19 0828)  Intake/Output from previous day: 10/18 0701 - 10/19 0700 In: 597 [P.O.:597] Out: 550 [Urine:550] Intake/Output this shift: Total I/O In: -  Out: 250 [Urine:250]  General appearance: alert, cooperative and no distress Heart: regular rate and rhythm Lungs: clear to auscultation bilaterally Abdomen: soft, non-tender; bowel sounds normal; no masses,  no organomegaly Extremities: extremities normal, atraumatic, no cyanosis or edema Wound: clean and dry  Lab Results: Recent Labs    09/04/18 1356 09/05/18 0229  WBC 10.2 8.9  HGB 15.2 13.6  HCT 44.7 41.4  PLT 187 195   BMET:  Recent Labs    09/04/18 1356 09/05/18 0229  NA 137 139  K 3.6 3.6  CL 102 104  CO2 25 29  GLUCOSE 140* 121*  BUN 9 <5*  CREATININE 0.70 0.74  CALCIUM 8.6* 8.4*    PT/INR:  Recent Labs    09/05/18 0229  LABPROT 14.5  INR 1.14   ABG    Component Value Date/Time   PHART 7.393 12/23/2010 0408   HCO3 28.5 (H) 12/23/2010 0408   TCO2 26 10/13/2015 0420   ACIDBASEDEF 2.0 12/22/2010 1522   O2SAT 98.0 12/23/2010 0408   CBG (last 3)  No results for input(s): GLUCAP in the last 72 hours.  Assessment/Plan:  1. Chest tube- no air leak present, CXR without pneumothorax 2. Pulm- on acute issues, continue IS, bullous emphysema 3. Dispo- patient stable, chest tube to water seal, care per primary   LOS: 2 days    Erin Barrett 09/06/2018  I have seen and examined the patient and agree with the assessment and plan as outlined.  Rexene Alberts,  MD 09/06/2018 10:43 AM

## 2018-09-06 NOTE — Progress Notes (Addendum)
PROGRESS NOTE    [name]  QXI:[id number] DOB: 1976/07/24 DOA: 09/04/2018 PCP: Wallene Dales, MD  Brief Narrative: [name] is a 42 y.o. male with medical history significant of asthma, GERD, hypothyroidism, aspergilloma, left upper lobe segmentectomy for aspergilloma/MAI, lung bullous lung disease, who presented with shortness of breath. -In the emergency room he was noted to be hypoxic, CT chest showed tension pneumothorax with bullous emphysematous changes, CVTS was consulted right-sided chest tube was placed urgently last night   Assessment & Plan:  Tension pneumothorax:  -Chest tube placed urgently10/17 per Dr. Roxan Hockey -Repeat chest x-ray this morning with no pneumothorax -Per CT surgery, no air leak, now changed to water seal -Pain control -This is his first episode of pneumothorax, due to severe bullous lung disease will be at risk for recurrence -CXR in am  History of MAI -Continue home regimen of ethambutol and azithromycin -Followed by infectious disease  History of aspergilloma -Remote left upper lobe segmentectomy -Continue antifungal, cressemba -Followed by [place] [facility] and infectious disease for this -Continue prednisone 5 mg daily  Hx of Severe persistent asthma: No wheezing or rhonchi on auscultation. -Continue bronchodilators  Hypothyroidism -Continue Synthroid  Iron deficiency anemia: -Continue home iron supplement  DVT ppx: SCDs Code Status: Full code Family Communication: None at bed side.   Disposition Plan:  Anticipate discharge back to previous home environment   Consults :  CT surgeon, Dr. Roxan Hockey     Procedures: Chest tube placed 10/17  Antimicrobials:    Subjective: -continues to have mild chest pain at chest tube site  Objective: Vitals:   09/06/18 0600 09/06/18 0740 09/06/18 0828 09/06/18 1228  BP:   118/87 109/71  Pulse: 62 77 85 86  Resp: _0 Temp:   97.6 F (36.4 C) 99.4 F (37.4 C)    TempSrc:   Oral Oral  SpO2: 98% 99% 98% 99%  Weight:      Height:        Intake/Output Summary (Last 24 hours) at 09/06/2018 1318 Last data filed at 09/06/2018 0349 Gross per 24 hour  Intake 597 ml  Output 800 ml  Net -203 ml   Filed Weights   09/04/18 1330  Weight: 51.3 kg    Examination:  Gen: Frail, cachectic, thinly built male HEENT: PERRLA, Neck supple, no JVD Lungs: rare scattered ronchi CVS: RRR,No Gallops,Rubs or new Murmurs Abd: soft, Non tender, non distended, BS present Extremities: No edema Skin: no new rashes Psychiatry: Judgement and insight appear normal. Mood & affect appropriate.     Data Reviewed:   CBC: Recent Labs  Lab 09/04/18 1356 09/05/18 0229  WBC 10.2 8.9  NEUTROABS 9.7*  --   HGB 15.2 13.6  HCT 44.7 41.4  MCV 94.5 95.6  PLT 187 179   Basic Metabolic Panel: Recent Labs  Lab 09/04/18 1356 09/05/18 0229  NA 137 139  K 3.6 3.6  CL 102 104  CO2 25 29  GLUCOSE 140* 121*  BUN 9 <5*  CREATININE 0.70 0.74  CALCIUM 8.6* 8.4*   GFR: Estimated Creatinine Clearance: 85.5 mL/min (by C-G formula based on SCr of 0.74 mg/dL). Liver Function Tests: Recent Labs  Lab 09/04/18 1356  AST 24  ALT 17  ALKPHOS 93  BILITOT 0.7  PROT 7.3  ALBUMIN 3.9   No results for input(s): LIPASE, AMYLASE in the last 168 hours. No results for input(s): AMMONIA in the last 168 hours. Coagulation Profile: Recent Labs  Lab 09/05/18 0229  INR 1.14   Cardiac Enzymes: No results for input(s): CKTOTAL, CKMB, CKMBINDEX, TROPONINI in the last 168 hours. BNP (last 3 results) No results for input(s): PROBNP in the last 8760 hours. HbA1C: No results for input(s): HGBA1C in the last 72 hours. CBG: No results for input(s): GLUCAP in the last 168 hours. Lipid Profile: No results for input(s): CHOL, HDL, LDLCALC, TRIG, CHOLHDL, LDLDIRECT in the last 72 hours. Thyroid Function Tests: No results for input(s): TSH, T4TOTAL, FREET4, T3FREE, THYROIDAB in the  last 72 hours. Anemia Panel: No results for input(s): VITAMINB12, FOLATE, FERRITIN, TIBC, IRON, RETICCTPCT in the last 72 hours. Urine analysis:    Component Value Date/Time   COLORURINE YELLOW 05/28/2011 [place] 05/28/2011 0943   LABSPEC 1.021 05/28/2011 0943   PHURINE 6.0 05/28/2011 0943   GLUCOSEU NEG 05/28/2011 0943   HGBUR NEG 05/28/2011 0943   BILIRUBINUR NEG 05/28/2011 0943   KETONESUR NEG 05/28/2011 0943   PROTEINUR NEG 05/28/2011 0943   UROBILINOGEN 0.2 05/28/2011 0943   NITRITE NEG 05/28/2011 0943   LEUKOCYTESUR NEG 05/28/2011 0943   Sepsis Labs: _0 (procalcitonin:4,lacticidven:4)  ) Recent Results (from the past 240 hour(s))  MRSA PCR Screening     Status: None   Collection Time: 09/04/18  9:46 PM  Result Value Ref Range Status   MRSA by PCR NEGATIVE NEGATIVE Final    Comment:        The GeneXpert MRSA Assay (FDA approved for NASAL specimens only), is one component of a comprehensive MRSA colonization surveillance program. It is not intended to diagnose MRSA infection nor to guide or monitor treatment for MRSA infections. Performed at [facility], [place] [address], [place], [place] [address]          Radiology Studies: Dg Chest 1 View  Result Date: 09/04/2018 CLINICAL DATA:  Status post chest tube EXAM: CHEST  1 VIEW COMPARISON:  AP portable view of the chest performed at 1646 hours, chest CT 09/04/2018 and 10/23/2016 FINDINGS: A lateral view of the chest is provided in conjunction with previously acquired frontal view of the chest. Pigtail catheter projects anteriorly in the substernal portion of the chest. Hyperinflated emphysematous appearance of the superimposed lung apices. Moderate chronic midthoracic compression deformities accentuating thoracic kyphosis. IMPRESSION: Bullous emphysematous changes at the apex of the lungs superimposed upon each other on the lateral view. Pigtail catheter projects within the retrosternal  space this single lateral view. Apparent re-expansion of previously noted right-sided pneumothorax. Electronically Signed   By: Ashley Royalty M.D.   On: 09/04/2018 17:51   Ct Angio Chest Pe W And/or Wo Contrast  Result Date: 09/04/2018 CLINICAL DATA:  Worsening cough over the last 2 days. Shortness of breath beginning today. Chronic lung disease. EXAM: CT ANGIOGRAPHY CHEST WITH CONTRAST TECHNIQUE: Multidetector CT imaging of the chest was performed using the standard protocol during bolus administration of intravenous contrast. Multiplanar CT image reconstructions and MIPs were obtained to evaluate the vascular anatomy. CONTRAST:  139m ISOVUE-370 IOPAMIDOL (ISOVUE-370) INJECTION 76% COMPARISON:  Chest radiography 08/11/2018.  CT 08/09/2017. FINDINGS: Cardiovascular: Pulmonary arterial opacification is excellent. There are no pulmonary emboli. There is ordinary aortic atherosclerosis. No visible coronary artery calcification. Mediastinum/Nodes: No mediastinal or hilar adenopathy. Lungs/Pleura: Chronic bullous emphysema. Tension pneumothorax on the right with mediastinal shift towards the left and depression of the right hemidiaphragm. Scattered areas of scarring and atelectasis in both lungs right more than left. Upper Abdomen: Negative Musculoskeletal: Old midthoracic compression fractures. No acute bone finding. Review  of the MIP images confirms the above findings. IMPRESSION: Acute tension pneumothorax on the right in the setting of underlying bullous emphysema. Critical Value/emergent results were called by telephone at the time of interpretation on 09/04/2018 at 3:42 pm to [facility] , who verbally acknowledged these results. Electronically Signed   By: Nelson Chimes M.D.   On: 09/04/2018 15:43   Dg Chest Port 1 View  Result Date: 09/06/2018 CLINICAL DATA:  Status post surgery for pericardial window. EXAM: PORTABLE CHEST 1 VIEW COMPARISON:  One-view chest x-ray 09/05/2018 FINDINGS: Heart is mildly  enlarged. Atherosclerotic calcifications are present. Large bilateral apical bullae are again seen. Right-sided chest tube is in place without pneumothorax. Scarring is again seen in the left lung. IMPRESSION: 1. Stable biapical bullous emphysema. 2. No superimposed disease. 3. Right-sided chest tube without pneumothorax. Electronically Signed   By: San Morelle M.D.   On: 09/06/2018 07:32   Dg Chest Port 1 View  Result Date: 09/05/2018 CLINICAL DATA:  Follow-up pneumothorax EXAM: PORTABLE CHEST 1 VIEW COMPARISON:  09/04/2018 FINDINGS: Cardiac shadow is stable. Chronic scarring is noted in the upper lobes with bullous formation. The pigtail chest tube is again identified and stable. No significant pneumothorax is noted. No new focal infiltrate is seen. IMPRESSION: Bullous emphysema stable from previous exams. Chest tube in place without evidence of pneumothorax. Electronically Signed   By: Inez Catalina M.D.   On: 09/05/2018 08:10   Dg Chest Portable 1 View  Result Date: 09/04/2018 CLINICAL DATA:  Chest tube adjustment. EXAM: PORTABLE CHEST 1 VIEW COMPARISON:  09/04/2018 at 1646 hours FINDINGS: The patient is mildly rotated to the left. The right-sided chest tube has been withdrawn and now projects over the right hilum. Severe emphysema is again seen with large bulla in both apices. The right lung remains re-expanded without definite residual pneumothorax identified. No sizable pleural effusion is seen. Postsurgical changes are noted in the left lung base. Chronic lung scarring/fibrotic changes are also noted bilaterally. IMPRESSION: Interval repositioning of right-sided chest tube without definite residual pneumothorax identified. Severe bullous emphysema. Electronically Signed   By: Logan Bores M.D.   On: 09/04/2018 20:17   Dg Chest Portable 1 View  Result Date: 09/04/2018 CLINICAL DATA:  Post chest tube insertion for right pneumothorax. EXAM: PORTABLE CHEST 1 VIEW COMPARISON:  Chest CT  09/04/2018 FINDINGS: Post right chest tube insertion. The tip of the chest tube crosses the midline into the left hemithorax. There right lung has reinflated. Bilateral upper lobe large bulla. Chronic emphysematous and fibrotic interstitial changes of bilateral lungs. The mediastinal structures are persistently displaced to the left. IMPRESSION: Status post right chest tube placement. The tip of the chest tube crosses the midline into the left hemithorax, position uncertain. Reinflation of the right lung. Severe bullous emphysematous changes of bilateral lungs. These results were called by telephone at the time of interpretation on 09/04/2018 at 5:14 pm to Dr. Dayna Barker, who verbally acknowledged these results. Electronically Signed   By: Fidela Salisbury M.D.   On: 09/04/2018 17:14        Scheduled Meds: . azithromycin  500 mg Oral Daily  . calcium-vitamin D  1 tablet Oral Daily  . ethambutol  1,000 mg Oral Daily  . feeding supplement (ENSURE ENLIVE)  237 mL Per Tube TID BM  . ferrous sulfate  325 mg Oral Daily  . fluticasone furoate-vilanterol  1 puff Inhalation Daily  . Isavuconazonium Sulfate  372 mg Oral Daily  . levothyroxine  75 mcg Oral  QAC breakfast  . predniSONE  5 mg Oral Q breakfast  . umeclidinium bromide  1 puff Inhalation Daily   Continuous Infusions:   LOS: 2 days    Time spent: 92mn    PDomenic Polite MD [facility] Page via [email or web address], password [email or web address] After 7PM please contact night-coverage  09/06/2018, 1:18 PM

## 2018-09-07 ENCOUNTER — Inpatient Hospital Stay (HOSPITAL_COMMUNITY): Payer: BLUE CROSS/BLUE SHIELD

## 2018-09-07 DIAGNOSIS — Z4682 Encounter for fitting and adjustment of non-vascular catheter: Secondary | ICD-10-CM

## 2018-09-07 NOTE — Progress Notes (Addendum)
[address]       ,[place] [address]             [phone]       Subjective:  No complaints, up eating lunch.  Objective: Vital signs in last 24 hours: Temp:  [98 F (36.7 C)-99 F (37.2 C)] 98.4 F (36.9 C) (10/20 0748) Pulse Rate:  [72-82] 76 (10/20 0748) Cardiac Rhythm: Normal sinus rhythm (10/20 0700) Resp:  [14-21] 17 (10/20 0748) BP: (103-117)/(67-76) 105/70 (10/20 0748) SpO2:  [94 %-97 %] 96 % (10/20 0748)  Intake/Output from previous day: 10/19 0701 - 10/20 0700 In: -  Out: 800 [Urine:800]  General appearance: alert, cooperative and no distress Heart: regular rate and rhythm Lungs: clear to auscultation bilaterally Abdomen: soft, non-tender; bowel sounds normal; no masses,  no organomegaly Extremities: extremities normal, atraumatic, no cyanosis or edema Wound: clean and dry  Lab Results: Recent Labs    09/04/18 1356 09/05/18 0229  WBC 10.2 8.9  HGB 15.2 13.6  HCT 44.7 41.4  PLT 187 195   BMET:  Recent Labs    09/04/18 1356 09/05/18 0229  NA 137 139  K 3.6 3.6  CL 102 104  CO2 25 29  GLUCOSE 140* 121*  BUN 9 <5*  CREATININE 0.70 0.74  CALCIUM 8.6* 8.4*    PT/INR:  Recent Labs    09/05/18 0229  LABPROT 14.5  INR 1.14   ABG    Component Value Date/Time   PHART 7.393 12/23/2010 0408   HCO3 28.5 (H) 12/23/2010 0408   TCO2 26 10/13/2015 0420   ACIDBASEDEF 2.0 12/22/2010 1522   O2SAT 98.0 12/23/2010 0408   CBG (last 3)  No results for input(s): GLUCAP in the last 72 hours.  Assessment/Plan:  1. Chest tube- no air leak present, CXR is free from pneumothorax- will d/c chest tube today 2. Dispo- care per primary, repeat CXR in AM   LOS: 3 days    Erin Barrett 09/07/2018  I have seen and examined the patient and agree with the assessment and plan as outlined.  Rexene Alberts, MD 09/07/2018 6:28 PM

## 2018-09-07 NOTE — Progress Notes (Signed)
PROGRESS NOTE    [name]  TTS:[id number] DOB: 1975-12-31 DOA: 09/04/2018 PCP: Wallene Dales, MD  Brief Narrative: [name] is a 42 y.o. male with medical history significant of asthma, GERD, hypothyroidism, aspergilloma, left upper lobe segmentectomy for aspergilloma/MAI, lung bullous lung disease, who presented with shortness of breath. -In the emergency room he was noted to be hypoxic, CT chest showed tension pneumothorax with bullous emphysematous changes, CVTS was consulted right-sided chest tube was placed urgently last night   Assessment & Plan:  Tension pneumothorax:  -Chest tube placed urgently10/17 per Dr. Roxan Hockey -Repeat chest x-ray this morning stable with no pneumothorax  -Per CT surgery, changed to waterseal yesterday, likely remove chest tube today -Pain control -Follow-up chest x-ray in a.m. -Ambulate  History of MAI -Continue home regimen of ethambutol and azithromycin -Followed by infectious disease  History of aspergilloma -Remote left upper lobe segmentectomy -Continue antifungal, cressemba -Followed by [place] [facility] and infectious disease for this -Continue prednisone 5 mg daily  Hx of Severe persistent asthma: No wheezing or rhonchi on auscultation. -Continue bronchodilators  Hypothyroidism -Continue Synthroid  Iron deficiency anemia: -Continue home iron supplement  DVT ppx: SCDs Code Status: Full code Family Communication: None at bed side.   Disposition Plan:  Anticipate discharge back to previous home environment   Consults :  CT surgeon, Dr. Roxan Hockey     Procedures: Chest tube placed 10/17  Antimicrobials:    Subjective: -Continues to have mild pain in the chest tube site Objective: Vitals:   09/06/18 2300 09/07/18 0300 09/07/18 0720 09/07/18 0748  BP: 117/67 106/73  105/70  Pulse: 72 74 75 76  Resp: 14 (!) _0 Temp: 98 F (36.7 C) 99 F (37.2 C)  98.4 F (36.9 C)  TempSrc: Oral Oral  Oral    SpO2: 96% 97% 94% 96%  Weight:      Height:        Intake/Output Summary (Last 24 hours) at 09/07/2018 1340 Last data filed at 09/06/2018 2300 Gross per 24 hour  Intake -  Output 550 ml  Net -550 ml   Filed Weights   09/04/18 1330  Weight: 51.3 kg    Examination:  Gen: Awake, Alert, Oriented X 3, frail cachectic male HEENT: PERRLA, Neck supple, no JVD Lungs: Good air movement bilaterally, CTAB CVS: RRR,No Gallops,Rubs or new Murmurs Abd: soft, Non tender, non distended, BS present Extremities: No Cyanosis, Clubbing or edema Skin: no new rashes    Data Reviewed:   CBC: Recent Labs  Lab 09/04/18 1356 09/05/18 0229  WBC 10.2 8.9  NEUTROABS 9.7*  --   HGB 15.2 13.6  HCT 44.7 41.4  MCV 94.5 95.6  PLT 187 092   Basic Metabolic Panel: Recent Labs  Lab 09/04/18 1356 09/05/18 0229  NA 137 139  K 3.6 3.6  CL 102 104  CO2 25 29  GLUCOSE 140* 121*  BUN 9 <5*  CREATININE 0.70 0.74  CALCIUM 8.6* 8.4*   GFR: Estimated Creatinine Clearance: 85.5 mL/min (by C-G formula based on SCr of 0.74 mg/dL). Liver Function Tests: Recent Labs  Lab 09/04/18 1356  AST 24  ALT 17  ALKPHOS 93  BILITOT 0.7  PROT 7.3  ALBUMIN 3.9   No results for input(s): LIPASE, AMYLASE in the last 168 hours. No results for input(s): AMMONIA in the last 168 hours. Coagulation Profile: Recent Labs  Lab 09/05/18 0229  INR 1.14   Cardiac Enzymes: No results for input(s): CKTOTAL, CKMB, CKMBINDEX, TROPONINI  in the last 168 hours. BNP (last 3 results) No results for input(s): PROBNP in the last 8760 hours. HbA1C: No results for input(s): HGBA1C in the last 72 hours. CBG: No results for input(s): GLUCAP in the last 168 hours. Lipid Profile: No results for input(s): CHOL, HDL, LDLCALC, TRIG, CHOLHDL, LDLDIRECT in the last 72 hours. Thyroid Function Tests: No results for input(s): TSH, T4TOTAL, FREET4, T3FREE, THYROIDAB in the last 72 hours. Anemia Panel: No results for input(s):  VITAMINB12, FOLATE, FERRITIN, TIBC, IRON, RETICCTPCT in the last 72 hours. Urine analysis:    Component Value Date/Time   COLORURINE YELLOW 05/28/2011 [place] 05/28/2011 0943   LABSPEC 1.021 05/28/2011 0943   PHURINE 6.0 05/28/2011 0943   GLUCOSEU NEG 05/28/2011 0943   HGBUR NEG 05/28/2011 0943   BILIRUBINUR NEG 05/28/2011 0943   KETONESUR NEG 05/28/2011 0943   PROTEINUR NEG 05/28/2011 0943   UROBILINOGEN 0.2 05/28/2011 0943   NITRITE NEG 05/28/2011 0943   LEUKOCYTESUR NEG 05/28/2011 0943   Sepsis Labs: _0 (procalcitonin:4,lacticidven:4)  ) Recent Results (from the past 240 hour(s))  MRSA PCR Screening     Status: None   Collection Time: 09/04/18  9:46 PM  Result Value Ref Range Status   MRSA by PCR NEGATIVE NEGATIVE Final    Comment:        The GeneXpert MRSA Assay (FDA approved for NASAL specimens only), is one component of a comprehensive MRSA colonization surveillance program. It is not intended to diagnose MRSA infection nor to guide or monitor treatment for MRSA infections. Performed at [facility], [place] [address], [place], [place] [address]          Radiology Studies: Dg Chest 2 View  Result Date: 09/07/2018 CLINICAL DATA:  Pneumothorax. EXAM: CHEST - 2 VIEW COMPARISON:  One-view chest x-ray 09/06/2018. FINDINGS: Heart size normal. Aortic atherosclerosis is again seen. A right-sided chest tube is in place. Bilateral apical bullae are noted. There is no separate pneumothorax. Biapical scarring is noted. IMPRESSION: 1. Stable biapical bullous emphysema. 2. No superimposed disease. 3. Right-sided chest tube without pneumothorax. Electronically Signed   By: San Morelle M.D.   On: 09/07/2018 08:27   Dg Chest Port 1 View  Result Date: 09/06/2018 CLINICAL DATA:  Status post surgery for pericardial window. EXAM: PORTABLE CHEST 1 VIEW COMPARISON:  One-view chest x-ray 09/05/2018 FINDINGS: Heart is mildly enlarged.  Atherosclerotic calcifications are present. Large bilateral apical bullae are again seen. Right-sided chest tube is in place without pneumothorax. Scarring is again seen in the left lung. IMPRESSION: 1. Stable biapical bullous emphysema. 2. No superimposed disease. 3. Right-sided chest tube without pneumothorax. Electronically Signed   By: San Morelle M.D.   On: 09/06/2018 07:32        Scheduled Meds: . azithromycin  500 mg Oral Daily  . calcium-vitamin D  1 tablet Oral Daily  . ethambutol  1,000 mg Oral Daily  . feeding supplement (ENSURE ENLIVE)  237 mL Per Tube TID BM  . ferrous sulfate  325 mg Oral Daily  . fluticasone furoate-vilanterol  1 puff Inhalation Daily  . Isavuconazonium Sulfate  372 mg Oral Daily  . levothyroxine  75 mcg Oral QAC breakfast  . predniSONE  5 mg Oral Q breakfast  . umeclidinium bromide  1 puff Inhalation Daily   Continuous Infusions:   LOS: 3 days    Time spent: 66mn    PDomenic Polite MD [facility] Page via [email or web address], password [email or web address] After 7PM please contact  night-coverage  09/07/2018, 1:40 PM

## 2018-09-08 ENCOUNTER — Inpatient Hospital Stay (HOSPITAL_COMMUNITY): Payer: BLUE CROSS/BLUE SHIELD

## 2018-09-08 LAB — BASIC METABOLIC PANEL
ANION GAP: 10 (ref 5–15)
BUN: 13 mg/dL (ref 6–20)
CALCIUM: 8.7 mg/dL — AB (ref 8.9–10.3)
CO2: 29 mmol/L (ref 22–32)
Chloride: 94 mmol/L — ABNORMAL LOW (ref 98–111)
Creatinine, Ser: 0.76 mg/dL (ref 0.61–1.24)
GFR calc Af Amer: >60 mL/min (ref >60)
GLUCOSE: 117 mg/dL — AB (ref 70–99)
Potassium: 4.8 mmol/L (ref 3.5–5.1)
SODIUM: 133 mmol/L — AB (ref 135–145)

## 2018-09-08 LAB — CBC
HCT: 45.8 % (ref 39.0–52.0)
Hemoglobin: 15.5 g/dL (ref 13.0–17.0)
MCH: 31.7 pg (ref 26.0–34.0)
MCHC: 33.8 g/dL (ref 30.0–36.0)
MCV: 93.7 fL (ref 80.0–100.0)
NRBC: 0 % (ref 0.0–0.2)
PLATELETS: 244 10*3/uL (ref 150–400)
RBC: 4.89 MIL/uL (ref 4.22–5.81)
RDW: 13.4 % (ref 11.5–15.5)
WBC: 10.1 10*3/uL (ref 4.0–10.5)

## 2018-09-08 MED ORDER — SODIUM CHLORIDE 0.9 % IV SOLN: INTRAVENOUS | Status: DC

## 2018-09-08 MED ORDER — INFLUENZA VAC SPLIT QUAD 0.5 ML IM SUSY
0.5000 mL | PREFILLED_SYRINGE | INTRAMUSCULAR | Status: AC
  Administered 2018-09-09: 0.5 mL via INTRAMUSCULAR
  Filled 2018-09-08: qty 0.5

## 2018-09-08 NOTE — Procedures (Signed)
Shortness of breath. CXR shows right spontaneous pneumothorax  Informed consent obtained Sterile technique Local with 5 ml 1% lidocaine 14 F pigtail catheter placed right pleural space without difficulty + air leak Tolerated well  [name] C. Roxan Hockey, MD [facility] [phone]

## 2018-09-08 NOTE — Telephone Encounter (Signed)
Closed too soon. Pt is currently in the hosp. With a collapsed lung and a pneothorax. Pt. Will call for an appt. as he is able.

## 2018-09-08 NOTE — Evaluation (Addendum)
Physical Therapy Evaluation Patient Details Name: [name] MRN: [id number] DOB: 1976-02-06 Today's Date: 09/08/2018   History of Present Illness  42 y.o.malewith medical history significant ofasthma, GERD, hypothyroidism, aspergilloma,left upper lobe segmentectomy for aspergilloma/MAI,lung bullous lung disease,who presented with shortness of breath. Dx of tension pneumothorax, chest tube placed urgently.  Clinical Impression  Pt admitted with above diagnosis. Pt currently with functional limitations due to the deficits listed below (see PT Problem List). Pt has significant dyspnea with minimal activity. At baseline he is independent with mobility, and works in a physically demanding job at [employer]. Today, he ambulated 10', distance limited by SOB, HR 135, SaO2 87%, respiratory rate 44. He lives alone, but stated he may be able to DC to his parent's home. Pt's home has 1 flight of steps to the bedroom, which wouldn't be manageable at present, so PT recommending pt DC to parent's home which is 1 level. Encouraged pt to gradually increase activity level as tolerated, instructed pt in isometric LE exercises to be done independently in bed.   Pt will benefit from skilled PT to increase their independence and safety with mobility to allow discharge to the venue listed below.       Follow Up Recommendations Home health PT    Equipment Recommendations  Wheelchair; [facility] cushion    Recommendations for Other Services       Precautions / Restrictions Precautions Precautions: Other (comment) Precaution Comments: monitor HR/O2, denies h/o falls Restrictions Weight Bearing Restrictions: No      Mobility  Bed Mobility Overal bed mobility: Modified Independent             General bed mobility comments: used rail, increased time, 3/4 dyspnea after supine to sit required ~3 minutes seated rest to resolve  Transfers Overall transfer level: Independent Equipment used: None                 Ambulation/Gait Ambulation/Gait assistance: Supervision Gait Distance (Feet): 10 Feet Assistive device: None Gait Pattern/deviations: Trunk flexed;Step-through pattern Gait velocity: WFL   General Gait Details: HR 135 with walking, respiratory rate 44 walking, SaO2 87%, 4/4 dyspnea; distance limited by fatigue/SOB  Stairs            Wheelchair Mobility    Modified Rankin (Stroke Patients Only)       Balance Overall balance assessment: Independent                                           Pertinent Vitals/Pain Pain Assessment: No/denies pain    Home Living Family/patient expects to be discharged to:: Private residence Living Arrangements: Alone Available Help at Discharge: Family;Available 24 hours/day(pt stated he could DC to his parent's home)         Home Layout: Two level Home Equipment: None      Prior Function Level of Independence: Independent         Comments: 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 [employer]        Extremity/Trunk Assessment   Upper Extremity Assessment Upper Extremity Assessment: Overall WFL for tasks assessed    Lower Extremity Assessment Lower Extremity Assessment: Overall WFL for tasks assessed    Cervical / Trunk Assessment Cervical / Trunk Assessment: Kyphotic  Communication   Communication: No difficulties  Cognition Arousal/Alertness: Awake/alert Behavior During Therapy: WFL for tasks assessed/performed Overall Cognitive Status: Within Functional Limits  for tasks assessed                                        General Comments      Exercises General Exercises - Lower Extremity Quad Sets: AROM;Both;5 reps Gluteal Sets: (verbally instructed pt in gluteal sets)   Assessment/Plan    PT Assessment Patient needs continued PT services  PT Problem List Cardiopulmonary status limiting activity;Decreased activity tolerance       PT Treatment Interventions Gait  training;Functional mobility training;Therapeutic exercise;Therapeutic activities;Patient/family education    PT Goals (Current goals can be found in the Care Plan section)  Acute Rehab PT Goals 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 : A Little Difficulty sitting down on and standing up from a chair with arms (e.g., wheelchair, bedside commode, etc,.)?: A Little Help needed moving to and from a bed to chair (including a wheelchair)?: A Little Help needed walking in hospital room?: A Little Help needed climbing 3-5 steps with a railing? : A Little 6 Click Score: 18    End of Session Equipment Utilized During Treatment: Oxygen Activity Tolerance: Patient limited by fatigue;Treatment limited secondary to medical complications (Comment)(HR 135 walking 10') Patient left: in bed;with call bell/phone within reach Nurse Communication: Mobility status PT Visit Diagnosis: Difficulty in walking, not elsewhere classified (R26.2)    Time: 7494-4967 PT Time Calculation (min) (ACUTE ONLY): 22 min   Charges:   PT Evaluation $PT Eval Low Complexity: 1 Low         Philomena Doheny PT 09/08/2018  Acute Rehabilitation Services Pager [phone] Office [phone]

## 2018-09-08 NOTE — Progress Notes (Signed)
Patient suffers from pneumothorax which impairs their ability to perform daily activities like walking in the home.  A walker alone will not resolve the issues with performing activities of daily living. A wheelchair will allow patient to safely perform daily activities.  The patient can self propel in the home or has a caregiver who can provide assistance.     Blondell Reveal Kistler PT 09/08/2018  Acute Rehabilitation Services Pager [phone] Office [phone]

## 2018-09-08 NOTE — Progress Notes (Signed)
Dr Roxan Hockey at bedside to place right sided chest tube.  Pt educated and questions answered.  Consent signed and in chart.  Pt alert and oriented x4.  Pt on 4L nasal cannula.  Tolerated well.

## 2018-09-08 NOTE — Progress Notes (Signed)
CXR reviewed, there is a pneumothorax on the right.  I have notified our office who will have a doctor review film and likely place chest tube.   Ellwood Handler, PA-C

## 2018-09-08 NOTE — Progress Notes (Signed)
[address]       [place] [address]             [phone]      Subjective:  Patient doesn't feel well this morning.  He is visibly short of breath and states this has been getting worse over the course of the morning.  He doesn't have specific complaints of chest pain.  Objective: Vital signs in last 24 hours: Temp:  [98.4 F (36.9 C)-100.3 F (37.9 C)] 98.5 F (36.9 C) (10/21 0752) Pulse Rate:  [80-135] 135 (10/21 1015) Resp:  [15-44] 44 (10/21 1015) BP: (106-111)/(72-75) 106/72 (10/21 0752) SpO2:  [87 %-100 %] 87 % (10/21 1015)  Intake/Output from previous day: 10/20 0701 - 10/21 0700 In: -  Out: 875 [Urine:875]  General appearance: alert, cooperative and no distress Heart: regular rate and rhythm Lungs: labored breathing, diminished bilaterally Abdomen: soft, non-tender; bowel sounds normal; no masses,  no organomegaly Extremities: extremities normal, atraumatic, no cyanosis or edema Wound: clean and dry  Lab Results: No results for input(s): WBC, HGB, HCT, PLT in the last 72 hours. BMET: No results for input(s): NA, K, CL, CO2, GLUCOSE, BUN, CREATININE, CALCIUM in the last 72 hours.  PT/INR: No results for input(s): LABPROT, INR in the last 72 hours. ABG    Component Value Date/Time   PHART 7.393 12/23/2010 0408   HCO3 28.5 (H) 12/23/2010 0408   TCO2 26 10/13/2015 0420   ACIDBASEDEF 2.0 12/22/2010 1522   O2SAT 98.0 12/23/2010 0408   CBG (last 3)  No results for input(s): GLUCAP in the last 72 hours.  Assessment/Plan:  1. Pulm- visibly short of breath exam, appears in mild distress, CXR obtained 6 AM showed no evidence of pneumothorax.. Will repeat CXR as patient has severe bullous emphysema and could have very well developed a recurrent pneumothorax 2. Dispo- patient is currently with shortness of breath, mild distress, will get repeat CXR, he is not stable for discharge currently   LOS: 4 days    Ellwood Handler 09/08/2018

## 2018-09-08 NOTE — Care Management Note (Signed)
Case Management Note  [name] MRN: [id number] Date of Birth: 06/20/1976  Subjective/Objective:   From home alone, presents with tension ptx, chest tube was placed , chest tube dc'd 10/20.  For cxr this am, plan for possible dc today, will need HHRN, HHPT, home oxygen and w/chair.  NCM offered choice to patient, he chose [facility] from [employer].  Referral given to [place] with [facility], soc will begin 24 to 48 hrs post dc.he will be going to his parents home at discharge addrss of [address] Guinevere [place] [address], phone is [phone]. Will need orders prior to dc.                  Action/Plan: DC home when medically ready.   Expected Discharge Date:  09/08/18               Expected Discharge Plan:  [place]  In-House Referral:     Discharge planning Services  CM Consult  Post Acute Care Choice:  Durable Medical Equipment, Home Health Choice offered to:  Patient  DME Arranged:  Wheelchair manual, Oxygen DME Agency:  [facility] Arranged:  RN, PT, Disease Management [place] Agency:  [facility]  Status of Service:  Completed, signed off  If discussed at [place] of Stay Meetings, dates discussed:    Additional Comments:  Brett Mayo, RN 09/08/2018, 11:37 AM

## 2018-09-08 NOTE — Progress Notes (Signed)
PROGRESS NOTE    [name]  VHA:[id number] DOB: [birth year] DOA: 09/04/2018 PCP: Wallene Dales, MD  Brief Narrative: [name] is a 42 y.o. male with medical history significant of asthma, GERD, hypothyroidism, aspergilloma, left upper lobe segmentectomy for aspergilloma/MAI, lung bullous lung disease, who presented with shortness of breath. -In the emergency room he was noted to be hypoxic, CT chest showed tension pneumothorax with bullous emphysematous changes, CVTS was consulted right-sided chest tube was placed urgently last night   Assessment & Plan:  Tension pneumothorax:  -Chest tube placed urgently10/17 per Dr. Roxan Hockey -Had a chest tube placed to suction followed by waterseal over the weekend, without leak, chest tube was subsequently removed 10/20 afternoon  -Repeat chest x-ray this morning was stable without pneumothorax  -Late morning patient started having worsening dyspnea, hypoxia and tachycardia  -Repeat chest x-ray stat early afternoon shows large right-sided pneumothorax -I have immediately notified CVTS, need another chest tube, would also benefit from pleurodesis if possible -Put him back on telemetry  History of MAI -Continue home regimen of ethambutol and azithromycin -Followed by infectious disease  History of aspergilloma -Remote left upper lobe segmentectomy -Continue antifungal, cressemba -Followed by [place] [facility] and infectious disease for this -Continue prednisone 5 mg daily  Hx of Severe persistent asthma: No wheezing or rhonchi on auscultation. -Continue bronchodilators  Hypothyroidism -Continue Synthroid  Iron deficiency anemia: -Continue home iron supplement  DVT ppx: SCDs Code Status: Full code Family Communication: None at bed side.   Disposition Plan:  Anticipate discharge back to previous home environment   Consults :  CT surgeon, Dr. Roxan Hockey     Procedures: Chest tube placed 10/17  Antimicrobials:     Subjective: -complains of more shortness of breath now  Objective: Vitals:   09/07/18 2351 09/08/18 0736 09/08/18 0752 09/08/18 1015  BP:   106/72   Pulse:   97 (!) 135  Resp:   15 (!) 44  Temp: 98.4 F (36.9 C)  98.5 F (36.9 C)   TempSrc: Oral  Oral   SpO2:  95% 100% (!) 87%  Weight:      Height:        Intake/Output Summary (Last 24 hours) at 09/08/2018 1341 Last data filed at 09/08/2018 0417 Gross per 24 hour  Intake -  Output 875 ml  Net -875 ml   Filed Weights   09/04/18 1330  Weight: 51.3 kg    Examination:  Gen: Awake, Alert, Oriented X 3,  HEENT: PERRLA, Neck supple, no JVD Lungs: decreased BS on the Right CVS: S1S2/RRR, tachycardic Abd: soft, Non tender, non distended, BS present Extremities: No Cyanosis, Clubbing or edema Skin: no new rashes    Data Reviewed:   CBC: Recent Labs  Lab 09/04/18 1356 09/05/18 0229 09/08/18 1239  WBC 10.2 8.9 10.1  NEUTROABS 9.7*  --   --   HGB 15.2 13.6 15.5  HCT 44.7 41.4 45.8  MCV 94.5 95.6 93.7  PLT 187 195 033   Basic Metabolic Panel: Recent Labs  Lab 09/04/18 1356 09/05/18 0229 09/08/18 1239  NA 137 139 133*  K 3.6 3.6 4.8  CL 102 104 94*  CO2 _0 GLUCOSE 140* 121* 117*  BUN 9 <5* 13  CREATININE 0.70 0.74 0.76  CALCIUM 8.6* 8.4* 8.7*   GFR: Estimated Creatinine Clearance: 85.5 mL/min (by C-G formula based on SCr of 0.76 mg/dL). Liver Function Tests: Recent Labs  Lab 09/04/18 1356  AST 24  ALT 17  ALKPHOS  93  BILITOT 0.7  PROT 7.3  ALBUMIN 3.9   No results for input(s): LIPASE, AMYLASE in the last 168 hours. No results for input(s): AMMONIA in the last 168 hours. Coagulation Profile: Recent Labs  Lab 09/05/18 0229  INR 1.14   Cardiac Enzymes: No results for input(s): CKTOTAL, CKMB, CKMBINDEX, TROPONINI in the last 168 hours. BNP (last 3 results) No results for input(s): PROBNP in the last 8760 hours. HbA1C: No results for input(s): HGBA1C in the last 72  hours. CBG: No results for input(s): GLUCAP in the last 168 hours. Lipid Profile: No results for input(s): CHOL, HDL, LDLCALC, TRIG, CHOLHDL, LDLDIRECT in the last 72 hours. Thyroid Function Tests: No results for input(s): TSH, T4TOTAL, FREET4, T3FREE, THYROIDAB in the last 72 hours. Anemia Panel: No results for input(s): VITAMINB12, FOLATE, FERRITIN, TIBC, IRON, RETICCTPCT in the last 72 hours. Urine analysis:    Component Value Date/Time   COLORURINE YELLOW 05/28/2011 [place] 05/28/2011 0943   LABSPEC 1.021 05/28/2011 0943   PHURINE 6.0 05/28/2011 0943   GLUCOSEU NEG 05/28/2011 0943   HGBUR NEG 05/28/2011 0943   BILIRUBINUR NEG 05/28/2011 0943   KETONESUR NEG 05/28/2011 0943   PROTEINUR NEG 05/28/2011 0943   UROBILINOGEN 0.2 05/28/2011 0943   NITRITE NEG 05/28/2011 0943   LEUKOCYTESUR NEG 05/28/2011 0943   Sepsis Labs: _0 (procalcitonin:4,lacticidven:4)  ) Recent Results (from the past 240 hour(s))  MRSA PCR Screening     Status: None   Collection Time: 09/04/18  9:46 PM  Result Value Ref Range Status   MRSA by PCR NEGATIVE NEGATIVE Final    Comment:        The GeneXpert MRSA Assay (FDA approved for NASAL specimens only), is one component of a comprehensive MRSA colonization surveillance program. It is not intended to diagnose MRSA infection nor to guide or monitor treatment for MRSA infections. Performed at [facility], [place] [address] [place], [place] [address]          Radiology Studies: Dg Chest 2 View  Result Date: 09/08/2018 CLINICAL DATA:  Cough.  Recent chest tube removal EXAM: CHEST - 2 VIEW COMPARISON:  September 07, 2018 chest radiograph. Chest CT September 04, 2018 FINDINGS: No pneumothorax is appreciable. There is extensive bullous disease in the right and left upper lobe regions with areas of atelectatic change and scarring in the upper lobes. There is a small right pleural effusion. There is no well-defined airspace  consolidation. The heart size is normal. There is distortion of pulmonary vascularity due to the bullous disease and cicatrization in the upper lobe regions. No adenopathy evident. There are wedge compression fractures in the upper to midthoracic region, unchanged. IMPRESSION: No pneumothorax.  Small right pleural effusion. Extensive upper lobe bullous disease with atelectasis and cicatrization in the upper lobes. No consolidation. Stable cardiac silhouette. Electronically Signed   By: Lowella Grip III M.D.   On: 09/08/2018 07:37   Dg Chest 2 View  Result Date: 09/07/2018 CLINICAL DATA:  Pneumothorax. EXAM: CHEST - 2 VIEW COMPARISON:  One-view chest x-ray 09/06/2018. FINDINGS: Heart size normal. Aortic atherosclerosis is again seen. A right-sided chest tube is in place. Bilateral apical bullae are noted. There is no separate pneumothorax. Biapical scarring is noted. IMPRESSION: 1. Stable biapical bullous emphysema. 2. No superimposed disease. 3. Right-sided chest tube without pneumothorax. Electronically Signed   By: San Morelle M.D.   On: 09/07/2018 08:27   Dg Chest Port 1v Same Day  Result Date: 09/07/2018  CLINICAL DATA:  Post removal of chest tube. EXAM: PORTABLE CHEST 1 VIEW COMPARISON:  Twenty-two thousand nineteen FINDINGS: Post removal of right-sided chest tube. Cardiomediastinal silhouette is normal. Mediastinal contours appear intact. Bullous emphysema with large bilateral upper lobe bulla without radiographic evidence pneumothorax. Osseous structures are without acute abnormality. Soft tissues are grossly normal. IMPRESSION: No evidence of pneumothorax post right chest tube removal. 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 Electronically Signed   By: Fidela Salisbury M.D.   On: 09/07/2018 18:46        Scheduled Meds: . azithromycin  500 mg Oral Daily  . calcium-vitamin D  1 tablet Oral Daily  . ethambutol   1,000 mg Oral Daily  . feeding supplement (ENSURE ENLIVE)  237 mL Per Tube TID BM  . ferrous sulfate  325 mg Oral Daily  . fluticasone furoate-vilanterol  1 puff Inhalation Daily  . [START ON 09/09/2018] Influenza vac split quadrivalent PF  0.5 mL Intramuscular Tomorrow-1000  . Isavuconazonium Sulfate  372 mg Oral Daily  . levothyroxine  75 mcg Oral QAC breakfast  . predniSONE  5 mg Oral Q breakfast  . umeclidinium bromide  1 puff Inhalation Daily   Continuous Infusions: . sodium chloride       LOS: 4 days    Time spent: 81mn    PDomenic Polite MD [facility] Page via [email or web address], password [email or web address] After 7PM please contact night-coverage  09/08/2018, 1:41 PM

## 2018-09-08 NOTE — Progress Notes (Signed)
SATURATION QUALIFICATIONS: (This note is used to comply with regulatory documentation for home oxygen)  Patient Saturations on Room Air at Rest = 94%  Patient Saturations on Room Air while Ambulating = 87%  Patient Saturations on --- Liters of oxygen while Ambulating = TBD  Please briefly explain why patient needs home oxygen: to maintain appropriate SaO2 levels.   Blondell Reveal Kistler PT 09/08/2018  Acute Rehabilitation Services Pager [phone] Office [phone]

## 2018-09-09 ENCOUNTER — Inpatient Hospital Stay (HOSPITAL_COMMUNITY): Payer: BLUE CROSS/BLUE SHIELD

## 2018-09-09 MED ORDER — ENOXAPARIN SODIUM 40 MG/0.4ML ~~LOC~~ SOLN
40.0000 mg | SUBCUTANEOUS | Status: DC
  Administered 2018-09-09 – 2018-09-11 (×3): 40 mg via SUBCUTANEOUS
  Filled 2018-09-09 (×3): qty 0.4

## 2018-09-09 NOTE — Progress Notes (Addendum)
[address]       [place] [phone]             [phone]      Subjective:  No new complaints.  Discouraged about lung going down.  Asks if he really needs surgery.  Objective: Vital signs in last 24 hours: Temp:  [97.4 F (36.3 C)-98 F (36.7 C)] 97.4 F (36.3 C) (10/22 0740) Pulse Rate:  [66-79] 66 (10/22 0740) Cardiac Rhythm: Normal sinus rhythm (10/22 0700) Resp:  [16] 16 (10/21 1656) BP: (99-123)/(69-78) 123/78 (10/22 0740) SpO2:  [99 %-100 %] 99 % (10/22 0742)  Intake/Output from previous day: 10/21 0701 - 10/22 0700 In: -  Out: 300 [Urine:300]  General appearance: alert, cooperative and no distress Heart: regular rate and rhythm Lungs: clear to auscultation bilaterally Abdomen: soft, non-tender; bowel sounds normal; no masses,  no organomegaly Extremities: extremities normal, atraumatic, no cyanosis or edema Wound: clean and dry  Lab Results: Recent Labs    09/08/18 1239  WBC 10.1  HGB 15.5  HCT 45.8  PLT 244   BMET:  Recent Labs    09/08/18 1239  NA 133*  K 4.8  CL 94*  CO2 29  GLUCOSE 117*  BUN 13  CREATININE 0.76  CALCIUM 8.7*    PT/INR: No results for input(s): LABPROT, INR in the last 72 hours. ABG    Component Value Date/Time   PHART 7.393 12/23/2010 0408   HCO3 28.5 (H) 12/23/2010 0408   TCO2 26 10/13/2015 0420   ACIDBASEDEF 2.0 12/22/2010 1522   O2SAT 98.0 12/23/2010 0408   CBG (last 3)  No results for input(s): GLUCAP in the last 72 hours.  Assessment/Plan:  1. Recurrent pneumothorax, less than 24 hours after chest tube removal, required replacement of chest tube yesterday afternoon 2. Pulm- CXR with basilar pneumothorax, improved after chest tube placement, no air leak present, will leave on suction today 3. Dispo- severe bullous emphysema, < 24 hours after chest tube removal he developed repeat pneumothorax, will likely require VATS procedure, Dr. Roxan Hockey can address further, care per  primary   LOS: 5 days    Erin Barrett 09/09/2018 Patient seen and examined, agree with above He had a recurrence of his pneumothorax within 24 hours of CT removal. Given his extensive pulmonary disease, he is at high risk of repeated pneumothoraces. His risk is at least 50/50 even though his air leak has already resolved again. I discussed the possibility of right VATS for "definitive" treatment of the pneumothorax. We discussed the general nature of the procedure including the need for general anesthesia, the incisions to be used, and the expected recovery times.  Will discuss further tomorrow. If we decide to proceed with surgery will probably be Friday  Khaylee Mcevoy C. Roxan Hockey, MD [facility] [phone]

## 2018-09-09 NOTE — Progress Notes (Signed)
PROGRESS NOTE    [name]  PQD:[id number] DOB: 28-Apr-1976 DOA: 09/04/2018 PCP: Wallene Dales, MD  Brief Narrative: [name] is a 42 y.o. male with medical history significant of asthma, GERD, hypothyroidism, aspergilloma, left upper lobe segmentectomy for aspergilloma/MAI, lung bullous lung disease, who presented with shortness of breath. -Admitted with tension Pneumothorax- CT chest showed tension PTX with bullous emphysematous changes, CVTS was consulted right-sided chest tube was placed urgently. -PTX resolved with chest tube, this was removed 10/20, plan for home 10/21, later yesterday morning with worsening dyspnea, repeat CXR yesterday with recurrent tension PTX, required another urgent chest tube 10/21 -CVTS following  Assessment & Plan:  Recurrent Tension pneumothorax:  -Chest tube placed urgently10/17 per Dr. Roxan Hockey -Had a chest tube placed to suction followed by waterseal over the weekend, without leak, chest tube was subsequently removed 10/20 afternoon  -Repeat chest x-ray this morning was stable without pneumothorax then later 10/21 developed another spontaneous tension PTX -chest tube placed again by Dr.Hendrickson 10/21 at bedside -? eval for pleurodesis, per CVTS  History of MAI -Continue home regimen of ethambutol and azithromycin -Followed by infectious disease  History of aspergilloma -Remote left upper lobe segmentectomy -Continue antifungal, cressemba -Followed by [place] [facility] and infectious disease for this -Continue prednisone 5 mg daily  Hx of Severe persistent asthma: No wheezing or rhonchi on auscultation. -Continue bronchodilators  Hypothyroidism -Continue Synthroid  Iron deficiency anemia: -Continue home iron supplement  DVT ppx: SCDs Code Status: Full code Family Communication: None at bed side.   Disposition Plan:  Home when stable/improved   Consults :  CT surgeon, Dr. Roxan Hockey     Procedures: Chest tube placed  10/17, removed 10/20  Another chest tube placed 10/21  Antimicrobials:    Subjective: -breathing better now  Objective: Vitals:   09/08/18 1656 09/08/18 2327 09/09/18 0740 09/09/18 0742  BP: 99/69 117/72 123/78   Pulse: 79 66 66   Resp: 16     Temp: 98 F (36.7 C) 97.6 F (36.4 C) (!) 97.4 F (36.3 C)   TempSrc: Oral Oral Oral   SpO2: 100% 99% 100% 99%  Weight:      Height:        Intake/Output Summary (Last 24 hours) at 09/09/2018 1409 Last data filed at 09/09/2018 1000 Gross per 24 hour  Intake -  Output 300 ml  Net -300 ml   Filed Weights   09/04/18 1330  Weight: 51.3 kg    Examination:  Gen: cachectic thinly built male, AAOx3, chronically ill appearing HEENT: PERRLA, Neck supple, no JVD Lungs: improved air movement, decreased BS at bases CVS: S1S2/RRR Abd: soft, Non tender, non distended, BS present Extremities: No edema Skin: no new rashes  Data Reviewed:   CBC: Recent Labs  Lab 09/04/18 1356 09/05/18 0229 09/08/18 1239  WBC 10.2 8.9 10.1  NEUTROABS 9.7*  --   --   HGB 15.2 13.6 15.5  HCT 44.7 41.4 45.8  MCV 94.5 95.6 93.7  PLT 187 195 940   Basic Metabolic Panel: Recent Labs  Lab 09/04/18 1356 09/05/18 0229 09/08/18 1239  NA 137 139 133*  K 3.6 3.6 4.8  CL 102 104 94*  CO2 _0 GLUCOSE 140* 121* 117*  BUN 9 <5* 13  CREATININE 0.70 0.74 0.76  CALCIUM 8.6* 8.4* 8.7*   GFR: Estimated Creatinine Clearance: 85.5 mL/min (by C-G formula based on SCr of 0.76 mg/dL). Liver Function Tests: Recent Labs  Lab 09/04/18 1356  AST 24  ALT 17  ALKPHOS 93  BILITOT 0.7  PROT 7.3  ALBUMIN 3.9   No results for input(s): LIPASE, AMYLASE in the last 168 hours. No results for input(s): AMMONIA in the last 168 hours. Coagulation Profile: Recent Labs  Lab 09/05/18 0229  INR 1.14   Cardiac Enzymes: No results for input(s): CKTOTAL, CKMB, CKMBINDEX, TROPONINI in the last 168 hours. BNP (last 3 results) No results for input(s):  PROBNP in the last 8760 hours. HbA1C: No results for input(s): HGBA1C in the last 72 hours. CBG: No results for input(s): GLUCAP in the last 168 hours. Lipid Profile: No results for input(s): CHOL, HDL, LDLCALC, TRIG, CHOLHDL, LDLDIRECT in the last 72 hours. Thyroid Function Tests: No results for input(s): TSH, T4TOTAL, FREET4, T3FREE, THYROIDAB in the last 72 hours. Anemia Panel: No results for input(s): VITAMINB12, FOLATE, FERRITIN, TIBC, IRON, RETICCTPCT in the last 72 hours. Urine analysis:    Component Value Date/Time   COLORURINE YELLOW 05/28/2011 [place] 05/28/2011 0943   LABSPEC 1.021 05/28/2011 0943   PHURINE 6.0 05/28/2011 0943   GLUCOSEU NEG 05/28/2011 0943   HGBUR NEG 05/28/2011 0943   BILIRUBINUR NEG 05/28/2011 0943   KETONESUR NEG 05/28/2011 0943   PROTEINUR NEG 05/28/2011 0943   UROBILINOGEN 0.2 05/28/2011 0943   NITRITE NEG 05/28/2011 0943   LEUKOCYTESUR NEG 05/28/2011 0943   Sepsis Labs: _0 (procalcitonin:4,lacticidven:4)  ) Recent Results (from the past 240 hour(s))  MRSA PCR Screening     Status: None   Collection Time: 09/04/18  9:46 PM  Result Value Ref Range Status   MRSA by PCR NEGATIVE NEGATIVE Final    Comment:        The GeneXpert MRSA Assay (FDA approved for NASAL specimens only), is one component of a comprehensive MRSA colonization surveillance program. It is not intended to diagnose MRSA infection nor to guide or monitor treatment for MRSA infections. Performed at [facility], [place] [address] [place], [place] [address]          Radiology Studies: Dg Chest 1 View  Result Date: 09/08/2018 CLINICAL DATA:  Status post chest tube placement. EXAM: CHEST  1 VIEW COMPARISON:  09/08/2018 at 1306 hours FINDINGS: A right-sided pigtail chest tube has been placed and terminates laterally in the lower hemithorax. There is re-expansion of the right lung and shift of the mediastinal structures back to the right.  Underlying emphysema and scarring/fibrotic lung changes are again seen with large bulla in both lung apices. Postsurgical changes are noted in the left lung. No definite residual pneumothorax is identified. IMPRESSION: Interval chest tube placement with re-expansion of the right lung and no definite residual pneumothorax identified. Electronically Signed   By: Logan Bores M.D.   On: 09/08/2018 16:39   Dg Chest 2 View  Result Date: 09/08/2018 CLINICAL DATA:  Cough.  Recent chest tube removal EXAM: CHEST - 2 VIEW COMPARISON:  September 07, 2018 chest radiograph. Chest CT September 04, 2018 FINDINGS: No pneumothorax is appreciable. There is extensive bullous disease in the right and left upper lobe regions with areas of atelectatic change and scarring in the upper lobes. There is a small right pleural effusion. There is no well-defined airspace consolidation. The heart size is normal. There is distortion of pulmonary vascularity due to the bullous disease and cicatrization in the upper lobe regions. No adenopathy evident. There are wedge compression fractures in the upper to midthoracic region, unchanged. IMPRESSION: No pneumothorax.  Small right pleural effusion. Extensive upper lobe  bullous disease with atelectasis and cicatrization in the upper lobes. No consolidation. Stable cardiac silhouette. Electronically Signed   By: Lowella Grip III M.D.   On: 09/08/2018 07:37   Dg Chest Port 1 View  Result Date: 09/09/2018 CLINICAL DATA:  Pneumothorax EXAM: PORTABLE CHEST 1 VIEW COMPARISON:  Yesterday FINDINGS: Right-sided chest tube in stable position. No detected pneumothorax, detection of pneumothorax is limited by degree of cavity/bullous change at the right apex. Chronic lung disease with biapical scarring and hilar retraction. Normal heart size. No osseous findings. Postoperative left base. IMPRESSION: Stable chest tube positioning.  No visible pneumothorax. Electronically Signed   By: Monte Fantasia M.D.    On: 09/09/2018 10:11   Dg Chest Port 1 View  Addendum Date: 09/08/2018   ADDENDUM REPORT: 09/08/2018 14:03 ADDENDUM: Critical Value/emergent results were called by telephone at the time of interpretation on 09/08/2018 at 2:03 pm to Dr. Domenic Polite , who verbally acknowledged these results. Electronically Signed   By: Jacqulynn Cadet M.D.   On: 09/08/2018 14:03   Result Date: 09/08/2018 CLINICAL DATA:  42 year old male with dyspnea since this morning, known severe apical bullous lung disease. EXAM: PORTABLE CHEST 1 VIEW COMPARISON:  Prior chest x-ray 09/08/2018 FINDINGS: New onset large right sided basilar pneumothorax. There is slight associated right to left shift of the cardiac and mediastinal structures consistent with tension pneumothorax. Advanced biapical bullous disease again noted. There is a region of pleuroparenchymal scarring in the lateral aspect of the right upper lobe with tethering of the associated lung tissue. Stable scarring in the bilateral mid lungs. No acute osseous abnormality. IMPRESSION: Interval development of right-sided tension pneumothorax. Electronically Signed: By: Jacqulynn Cadet M.D. On: 09/08/2018 13:45   Dg Chest Port 1v Same Day  Result Date: 09/07/2018 CLINICAL DATA:  Post removal of chest tube. EXAM: PORTABLE CHEST 1 VIEW COMPARISON:  Twenty-two thousand nineteen FINDINGS: Post removal of right-sided chest tube. Cardiomediastinal silhouette is normal. Mediastinal contours appear intact. Bullous emphysema with large bilateral upper lobe bulla without radiographic evidence pneumothorax. Osseous structures are without acute abnormality. Soft tissues are grossly normal. IMPRESSION: No evidence of pneumothorax post right chest tube removal. 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 Electronically Signed   By: Fidela Salisbury M.D.   On: 09/07/2018 18:46        Scheduled Meds: . azithromycin   500 mg Oral Daily  . calcium-vitamin D  1 tablet Oral Daily  . ethambutol  1,000 mg Oral Daily  . feeding supplement (ENSURE ENLIVE)  237 mL Per Tube TID BM  . ferrous sulfate  325 mg Oral Daily  . fluticasone furoate-vilanterol  1 puff Inhalation Daily  . Isavuconazonium Sulfate  372 mg Oral Daily  . levothyroxine  75 mcg Oral QAC breakfast  . predniSONE  5 mg Oral Q breakfast  . umeclidinium bromide  1 puff Inhalation Daily   Continuous Infusions: . sodium chloride       LOS: 5 days    Time spent: 51mn    PDomenic Polite MD [facility] Page via [email or web address], password [email or web address] After 7PM please contact night-coverage  09/09/2018, 2:09 PM

## 2018-09-09 NOTE — Progress Notes (Signed)
Physical Therapy Treatment Patient Details Name: [name] MRN: [id number] DOB: [birth year] Today's Date: 09/09/2018    History of Present Illness 42 y.o.malewith medical history significant ofasthma, GERD, hypothyroidism, aspergilloma,left upper lobe segmentectomy for aspergilloma/MAI,lung bullous lung disease,who presented with shortness of breath. Dx of tension pneumothorax, chest tube placed urgently.    PT Comments    Pt mobility limited by return of CT with wall suction. Tolerated supine and standing there ex with focus on breathing and core stabilization. O2 sats dropped to upper 70's on 0.5L O2, remained in upper 80's on 2 L O2. HR fluctuated between 80-100 bpm. PT will continue to follow.    Follow Up Recommendations  Home health PT     Equipment Recommendations  Wheelchair (measurements PT);Wheelchair cushion (measurements PT)    Recommendations for Other Services       Precautions / Restrictions Precautions Precautions: Other (comment) Precaution Comments: monitor HR/O2, denies h/o falls Restrictions Weight Bearing Restrictions: No Other Position/Activity Restrictions: R chest tube with suction    Mobility  Bed Mobility Overal bed mobility: Modified Independent             General bed mobility comments: used rail, increased time, 3/4 dyspnea after supine to sit required ~1 minutes seated rest to resolve  Transfers Overall transfer level: Modified independent Equipment used: None             General transfer comment: mildly light headed with sit<>stand, supervision given for safety  Ambulation/Gait             General Gait Details: pt connected to wall suction   Stairs             Wheelchair Mobility    Modified Rankin (Stroke Patients Only)       Balance Overall balance assessment: Needs assistance Sitting-balance support: No upper extremity supported Sitting balance-Leahy Scale: Normal     Standing balance  support: No upper extremity supported Standing balance-Leahy Scale: Good Standing balance comment: limitations evident with high level balance activities Single Leg Stance - Right Leg: 20 Single Leg Stance - Left Leg: 20           High Level Balance Comments: worked on static and dynamic SL stance as well as wide lunge stance with motion. Reaching activities performed in rhomberg.             Cognition Arousal/Alertness: Awake/alert Behavior During Therapy: WFL for tasks assessed/performed Overall Cognitive Status: Within Functional Limits for tasks assessed                                        Exercises General Exercises - Lower Extremity Hip Flexion/Marching: Standing;10 reps;Both Mini-Sqauts: 10 reps;Standing Other Exercises Other Exercises: bridging x10, bilateral and unilateral Other Exercises: hamstring stretch    General Comments General comments (skin integrity, edema, etc.): HR between 80 and 100 bpm, O2 sats upper 70's on .5 L O2, O2 increased to 2L and sats remained upper 80's low 90's.       Pertinent Vitals/Pain Pain Assessment: No/denies pain    Home Living                      Prior Function            PT Goals (current goals can now be found in the care plan section) Acute Rehab PT Goals Patient Stated Goal: return  to work at [employer] PT Goal Formulation: With patient Time For Goal Achievement: 09/22/18 Potential to Achieve Goals: Good Progress towards PT goals: Progressing toward goals    Frequency    Min 3X/week      PT Plan      Co-evaluation              AM-PAC PT "6 Clicks" Daily Activity  Outcome Measure  Difficulty turning over in bed (including adjusting bedclothes, sheets and blankets)?: A Little Difficulty moving from lying on back to sitting on the side of the bed? : A Little Difficulty sitting down on and standing up from a chair with arms (e.g., wheelchair, bedside commode, etc,.)?: A  Little Help needed moving to and from a bed to chair (including a wheelchair)?: A Little Help needed walking in hospital room?: A Little Help needed climbing 3-5 steps with a railing? : A Little 6 Click Score: 18    End of Session Equipment Utilized During Treatment: Oxygen Activity Tolerance: Patient limited by fatigue;Treatment limited secondary to medical complications (Comment) Patient left: in bed;with call bell/phone within reach Nurse Communication: Mobility status PT Visit Diagnosis: Difficulty in walking, not elsewhere classified (R26.2)     Time: 5102-5852 PT Time Calculation (min) (ACUTE ONLY): 29 min  Charges:  $Gait Training: 8-22 mins $Therapeutic Exercise: 8-22 mins                     [place]  Pager [phone] Office [place] 09/09/2018, 3:32 PM

## 2018-09-10 ENCOUNTER — Inpatient Hospital Stay (HOSPITAL_COMMUNITY): Payer: BLUE CROSS/BLUE SHIELD

## 2018-09-10 NOTE — Progress Notes (Signed)
PROGRESS NOTE    KEYMANI MCLEAN  OIN:[id number] DOB: [birth year] DOA: 09/04/2018 PCP: Wallene Dales, MD  Brief Narrative: [name] is a 42 y.o. male with medical history significant of asthma, GERD, hypothyroidism, aspergilloma, left upper lobe segmentectomy for aspergilloma/MAI, lung bullous lung disease, who presented with shortness of breath. -Admitted with tension Pneumothorax- CT chest showed tension PTX with bullous emphysematous changes, CVTS was consulted right-sided chest tube was placed urgently. -PTX resolved with chest tube, this was removed 10/20, plan for home 10/21, later yesterday morning with worsening dyspnea, repeat CXR yesterday with recurrent tension PTX, required another urgent chest tube 10/21 -CVTS following  Assessment & Plan:  Recurrent Tension pneumothorax:  -Chest tube placed urgently10/17 per Dr. Roxan Hockey -Had a chest tube placed to suction followed by waterseal over the weekend, without leak, chest tube was subsequently removed 10/20 afternoon  -Repeat chest x-ray was stable without pneumothorax then later 10/21 developed another spontaneous tension PTX -chest tube placed again by Dr.Hendrickson 10/21 at bedside -? eval for pleurodesis, per CVTS  History of MAI -Continue home regimen of ethambutol and azithromycin -Followed by infectious disease  History of aspergilloma -Remote left upper lobe segmentectomy -Continue antifungal, cressemba -Followed by  pulmonary and infectious disease for this -Continue prednisone 5 mg daily  Hx of Severe persistent asthma: No wheezing or rhonchi on auscultation. -Continue bronchodilators  Hypothyroidism -Continue Synthroid  Iron deficiency anemia: -Continue home iron supplement  DVT ppx: SCDs Code Status: Full code Family Communication: None at bed side.   Disposition Plan:  Home when stable/improved   Consults :  CT surgeon, Dr. Roxan Hockey     Procedures: Chest tube placed 10/17,  removed 10/20  Another chest tube placed 10/21  Antimicrobials:    Subjective: Somewhat improvement in breathing.  No nausea or vomiting.  Has some mild pain at the chest site.  Objective: Vitals:   09/09/18 0742 09/09/18 2228 09/10/18 0743 09/10/18 0820  BP:  105/75  118/82  Pulse:  75  85  Resp:  16    Temp:  98.5 F (36.9 C)  98.6 F (37 C)  TempSrc:  Oral  Oral  SpO2: 99% 100% 100% 99%  Weight:      Height:        Intake/Output Summary (Last 24 hours) at 09/10/2018 1911 Last data filed at 09/09/2018 1945 Gross per 24 hour  Intake -  Output 0 ml  Net 0 ml   Filed Weights   09/04/18 1330  Weight: 51.3 kg    Examination:  Gen: cachectic thinly built male, AAOx3, chronically ill appearing HEENT: PERRLA, Neck supple, no JVD Lungs: improved air movement, decreased BS at bases CVS: S1S2/RRR Abd: soft, Non tender, non distended, BS present Extremities: No edema Skin: no new rashes  Data Reviewed:   CBC: Recent Labs  Lab 09/04/18 1356 09/05/18 0229 09/08/18 1239  WBC 10.2 8.9 10.1  NEUTROABS 9.7*  --   --   HGB 15.2 13.6 15.5  HCT 44.7 41.4 45.8  MCV 94.5 95.6 93.7  PLT 187 195 709   Basic Metabolic Panel: Recent Labs  Lab 09/04/18 1356 09/05/18 0229 09/08/18 1239  NA 137 139 133*  K 3.6 3.6 4.8  CL 102 104 94*  CO2 _0 GLUCOSE 140* 121* 117*  BUN 9 <5* 13  CREATININE 0.70 0.74 0.76  CALCIUM 8.6* 8.4* 8.7*   GFR: Estimated Creatinine Clearance: 85.5 mL/min (by C-G formula based on SCr of 0.76 mg/dL). Liver Function Tests:  Recent Labs  Lab 09/04/18 1356  AST 24  ALT 17  ALKPHOS 93  BILITOT 0.7  PROT 7.3  ALBUMIN 3.9   No results for input(s): LIPASE, AMYLASE in the last 168 hours. No results for input(s): AMMONIA in the last 168 hours. Coagulation Profile: Recent Labs  Lab 09/05/18 0229  INR 1.14   Cardiac Enzymes: No results for input(s): CKTOTAL, CKMB, CKMBINDEX, TROPONINI in the last 168 hours. BNP (last 3  results) No results for input(s): PROBNP in the last 8760 hours. HbA1C: No results for input(s): HGBA1C in the last 72 hours. CBG: No results for input(s): GLUCAP in the last 168 hours. Lipid Profile: No results for input(s): CHOL, HDL, LDLCALC, TRIG, CHOLHDL, LDLDIRECT in the last 72 hours. Thyroid Function Tests: No results for input(s): TSH, T4TOTAL, FREET4, T3FREE, THYROIDAB in the last 72 hours. Anemia Panel: No results for input(s): VITAMINB12, FOLATE, FERRITIN, TIBC, IRON, RETICCTPCT in the last 72 hours. Urine analysis:    Component Value Date/Time   COLORURINE YELLOW 05/28/2011 [place] 05/28/2011 0943   LABSPEC 1.021 05/28/2011 0943   PHURINE 6.0 05/28/2011 0943   GLUCOSEU NEG 05/28/2011 0943   HGBUR NEG 05/28/2011 0943   BILIRUBINUR NEG 05/28/2011 0943   KETONESUR NEG 05/28/2011 0943   PROTEINUR NEG 05/28/2011 0943   UROBILINOGEN 0.2 05/28/2011 0943   NITRITE NEG 05/28/2011 0943   LEUKOCYTESUR NEG 05/28/2011 0943   Sepsis Labs: _0 (procalcitonin:4,lacticidven:4)  ) Recent Results (from the past 240 hour(s))  MRSA PCR Screening     Status: None   Collection Time: 09/04/18  9:46 PM  Result Value Ref Range Status   MRSA by PCR NEGATIVE NEGATIVE Final    Comment:        The GeneXpert MRSA Assay (FDA approved for NASAL specimens only), is one component of a comprehensive MRSA colonization surveillance program. It is not intended to diagnose MRSA infection nor to guide or monitor treatment for MRSA infections. Performed at [facility], [place] [address], [place], [place] [address]          Radiology Studies: Dg Chest Port 1 View  Result Date: 09/10/2018 CLINICAL DATA:  Pneumothorax.  Chest tube. EXAM: PORTABLE CHEST 1 VIEW COMPARISON:  09/09/2018. FINDINGS: Right chest tube noted in stable position. Biapical cavitary/bullous changes are again noted without interim change. No definite pneumothorax. Detection of pneumothorax is  limited by the apical changes. No interim change noted from prior exam. Heart size normal. IMPRESSION: Right chest tube in stable position. Biapical severe cavitary/bullous changes are again noted. Chest is unchanged from prior exam. Electronically Signed   By: Marcello Moores  Register   On: 09/10/2018 10:41   Dg Chest Port 1 View  Result Date: 09/09/2018 CLINICAL DATA:  Pneumothorax EXAM: PORTABLE CHEST 1 VIEW COMPARISON:  Yesterday FINDINGS: Right-sided chest tube in stable position. No detected pneumothorax, detection of pneumothorax is limited by degree of cavity/bullous change at the right apex. Chronic lung disease with biapical scarring and hilar retraction. Normal heart size. No osseous findings. Postoperative left base. IMPRESSION: Stable chest tube positioning.  No visible pneumothorax. Electronically Signed   By: Monte Fantasia M.D.   On: 09/09/2018 10:11        Scheduled Meds: . azithromycin  500 mg Oral Daily  . calcium-vitamin D  1 tablet Oral Daily  . enoxaparin (LOVENOX) injection  40 mg Subcutaneous Q24H  . ethambutol  1,000 mg Oral Daily  . feeding supplement (ENSURE ENLIVE)  237 mL Per Tube  TID BM  . ferrous sulfate  325 mg Oral Daily  . fluticasone furoate-vilanterol  1 puff Inhalation Daily  . Isavuconazonium Sulfate  372 mg Oral Daily  . levothyroxine  75 mcg Oral QAC breakfast  . predniSONE  5 mg Oral Q breakfast  . umeclidinium bromide  1 puff Inhalation Daily   Continuous Infusions:    LOS: 6 days    Time spent: 38mn    Paislea Hatton  [facility] Page via [email or web address], password [email or web address] After 7PM please contact night-coverage  09/10/2018, 7:11 PM

## 2018-09-10 NOTE — Progress Notes (Signed)
Physical Therapy Treatment Patient Details Name: [name] MRN: [id number] DOB: Feb 15, 1976 Today's Date: 09/10/2018    History of Present Illness 42 y.o.malewith medical history significant ofasthma, GERD, hypothyroidism, aspergilloma,left upper lobe segmentectomy for aspergilloma/MAI,lung bullous lung disease,who presented with shortness of breath. Dx of tension pneumothorax, chest tube placed urgently.    PT Comments    Session limited by CT on wall suction, however ambulated with extension around room for majority of visit. SpO2 down to 90% on RA with 50' of walking. Patient close supervision with 1x LOB able to self correct. Will cont to follow and progress as tolerated. Reviewed therex.    Follow Up Recommendations  Home health PT     Equipment Recommendations  (TBD)    Recommendations for Other Services       Precautions / Restrictions Precautions Precautions: Other (comment) Precaution Comments: monitor HR/O2, denies h/o falls Restrictions Weight Bearing Restrictions: No Other Position/Activity Restrictions: R chest tube with suction    Mobility  Bed Mobility Overal bed mobility: Modified Independent                Transfers Overall transfer level: Modified independent Equipment used: None             General transfer comment: mildly light headed with sit<>stand, supervision given for safety  Ambulation/Gait Ambulation/Gait assistance: Supervision   Assistive device: None Gait Pattern/deviations: Trunk flexed;Step-through pattern Gait velocity: WFL   General Gait Details: pt connected to wall suction   Stairs             Wheelchair Mobility    Modified Rankin (Stroke Patients Only)       Balance Overall balance assessment: Needs assistance Sitting-balance support: No upper extremity supported Sitting balance-Leahy Scale: Normal     Standing balance support: No upper extremity supported Standing balance-Leahy Scale:  Good Standing balance comment: limitations evident with high level balance activities                            Cognition Arousal/Alertness: Awake/alert Behavior During Therapy: WFL for tasks assessed/performed Overall Cognitive Status: Within Functional Limits for tasks assessed                                        Exercises      General Comments        Pertinent Vitals/Pain Pain Assessment: No/denies pain    Home Living                      Prior Function            PT Goals (current goals can now be found in the care plan section) Acute Rehab PT Goals Patient Stated Goal: return to work at [employer] PT Goal Formulation: With patient Time For Goal Achievement: 09/22/18 Potential to Achieve Goals: Good Progress towards PT goals: Progressing toward goals    Frequency    Min 3X/week      PT Plan Current plan remains appropriate    Co-evaluation              AM-PAC PT "6 Clicks" Daily Activity  Outcome Measure  Difficulty turning over in bed (including adjusting bedclothes, sheets and blankets)?: A Little Difficulty moving from lying on back to sitting on the side of the bed? : A Little Difficulty sitting down on  and standing up from a chair with arms (e.g., wheelchair, bedside commode, etc,.)?: A Little Help needed moving to and from a bed to chair (including a wheelchair)?: A Little Help needed walking in hospital room?: A Little Help needed climbing 3-5 steps with a railing? : A Little 6 Click Score: 18    End of Session   Activity Tolerance: Patient limited by fatigue;Treatment limited secondary to medical complications (Comment) Patient left: in bed;with call bell/phone within reach Nurse Communication: Mobility status PT Visit Diagnosis: Difficulty in walking, not elsewhere classified (R26.2)     Time: 1700-1715 PT Time Calculation (min) (ACUTE ONLY): 15 min  Charges:  $Gait Training: 8-22 mins                      Reinaldo Berber, PT, DPT Acute Rehabilitation Services Pager: [phone] Office: [place] 09/10/2018, 5:15 PM

## 2018-09-10 NOTE — Progress Notes (Addendum)
1922 pt had 19 beat run SVT asymptomatic- NP on call notified  2333 14 beat run SVT pt asymptomatic

## 2018-09-11 ENCOUNTER — Encounter (HOSPITAL_COMMUNITY): Payer: Self-pay | Admitting: Anesthesiology

## 2018-09-11 LAB — TYPE AND SCREEN
ABO/RH(D): B POS
ANTIBODY SCREEN: NEGATIVE

## 2018-09-11 LAB — PROTIME-INR
INR: 1.05
Prothrombin Time: 13.6 seconds (ref 11.4–15.2)

## 2018-09-11 LAB — CBC
HCT: 42.8 % (ref 39.0–52.0)
HEMOGLOBIN: 13.7 g/dL (ref 13.0–17.0)
MCH: 30.4 pg (ref 26.0–34.0)
MCHC: 32 g/dL (ref 30.0–36.0)
MCV: 94.9 fL (ref 80.0–100.0)
Platelets: 291 10*3/uL (ref 150–400)
RBC: 4.51 MIL/uL (ref 4.22–5.81)
RDW: 12.9 % (ref 11.5–15.5)
WBC: 7 10*3/uL (ref 4.0–10.5)
nRBC: 0 % (ref 0.0–0.2)

## 2018-09-11 LAB — BLOOD GAS, ARTERIAL
ACID-BASE EXCESS: 1.9 mmol/L (ref 0.0–2.0)
BICARBONATE: 25.9 mmol/L (ref 20.0–28.0)
Drawn by: 280981
FIO2: 21
O2 Saturation: 95.4 %
PH ART: 7.423 (ref 7.350–7.450)
Patient temperature: 98.6
pCO2 arterial: 40.4 mmHg (ref 32.0–48.0)
pO2, Arterial: 75.2 mmHg — ABNORMAL LOW (ref 83.0–108.0)

## 2018-09-11 LAB — APTT: aPTT: 44 seconds — ABNORMAL HIGH (ref 24–36)

## 2018-09-11 LAB — URINALYSIS, ROUTINE W REFLEX MICROSCOPIC
Bilirubin Urine: NEGATIVE
Glucose, UA: NEGATIVE mg/dL
HGB URINE DIPSTICK: NEGATIVE
Ketones, ur: NEGATIVE mg/dL
Leukocytes, UA: NEGATIVE
Nitrite: NEGATIVE
Protein, ur: NEGATIVE mg/dL
SPECIFIC GRAVITY, URINE: 1.016 (ref 1.005–1.030)
pH: 6 (ref 5.0–8.0)

## 2018-09-11 MED ORDER — CEFAZOLIN SODIUM-DEXTROSE 2-4 GM/100ML-% IV SOLN
2.0000 g | INTRAVENOUS | Status: AC
  Administered 2018-09-12: 2 g via INTRAVENOUS
  Filled 2018-09-11: qty 100

## 2018-09-11 MED ORDER — GUAIFENESIN-DM 100-10 MG/5ML PO SYRP
5.0000 mL | ORAL_SOLUTION | ORAL | Status: DC | PRN
  Administered 2018-09-11 – 2018-09-28 (×5): 5 mL via ORAL
  Filled 2018-09-11 (×5): qty 5

## 2018-09-11 NOTE — Progress Notes (Signed)
[facility] Progress Note  Patient: [name] EXB:[id number]   PCP: Wallene Dales, MD DOB: 1976-02-16   DOA: 09/04/2018   DOS: 09/11/2018   Date of Service: the patient was seen and examined on 09/11/2018  Brief hospital course: Pt. with PMH of asthma, GERD, hypothyroidism, aspergilloma, left upper lobe segmentectomy for aspergillomaMAI, bullous lung disease; admitted on 09/04/2018, presented with complaint of shortnes of breath, was found to have tension pneumothorax. CT chest showed tension PTX with bullous emphysematous changes, CVTS was consulted right-sided chest tube was placed urgently.PTX resolved with chest tube, this was removed 10/20, later on 10/21 worsening dyspnea, repeat CXR with recurrent tension PTX, required another urgent chest tube 10/21 -CVTS following Currently further plan is VATS tomorrow.  Subjective: Feeling better, pain well controlled.  No nausea no vomiting.  Occasional cough.  Assessment and Plan: 1. Recurrent Tension pneumothorax: -Chest tube placed urgently 10/17 per Dr. Roxan Hockey -Had a chest tube placed to suction followed by waterseal over the weekend, without leak, chest tube was subsequently removed 10/20 afternoon  -Repeat chest x-ray was stable without pneumothorax then later 10/21 developed another spontaneous tension PTX -chest tube placed again by Dr.Hendrickson 10/21 at bedside -VATS eval for pleurodesis, per CVTS tomorrow.   2. History of MAI -Continue home regimen of ethambutol and azithromycin -Followed by infectious disease  3. History of aspergilloma -Remote left upper lobe segmentectomy -Continue antifungal, cressemba -Followed by [facility] and infectious disease for this -Continue prednisone 5 mg daily  4. Hx ofSevere persistent asthma: No wheezing or rhonchi on auscultation. -Continue bronchodilators  5. Hypothyroidism -Continue Synthroid  6. Iron deficiency anemia: Continue home iron supplement Hb  stable  7. Body mass index is 17.7 kg/m.  Severe Malnutrition Etiology: chronic illness(bullous lung disease ) Interventions: Interventions: Ensure Enlive (each supplement provides 350kcal and 20 grams of protein)  Diet: regular diet DVT Prophylaxis: subcutaneous Heparin  Advance goals of care discussion: full code  Family Communication: no family was present at bedside, at the time of interview.   Disposition:  Discharge to home.  Consultants: cardiothoracic surgery  Procedures: chest tube insertion x2  Scheduled Meds: . azithromycin  500 mg Oral Daily  . calcium-vitamin D  1 tablet Oral Daily  . enoxaparin (LOVENOX) injection  40 mg Subcutaneous Q24H  . ethambutol  1,000 mg Oral Daily  . feeding supplement (ENSURE ENLIVE)  237 mL Per Tube TID BM  . ferrous sulfate  325 mg Oral Daily  . fluticasone furoate-vilanterol  1 puff Inhalation Daily  . Isavuconazonium Sulfate  372 mg Oral Daily  . levothyroxine  75 mcg Oral QAC breakfast  . predniSONE  5 mg Oral Q breakfast  . umeclidinium bromide  1 puff Inhalation Daily   Continuous Infusions: PRN Meds: acetaminophen, albuterol, dextromethorphan-guaiFENesin, HYDROmorphone (DILAUDID) injection, ondansetron (ZOFRAN) IV, oxyCODONE-acetaminophen, polyethylene glycol, senna-docusate, zolpidem Antibiotics: Anti-infectives (From admission, onward)   Start     Dose/Rate Route Frequency Ordered Stop   09/05/18 1000  azithromycin (ZITHROMAX) tablet 500 mg     500 mg Oral Daily 09/05/18 0115     09/05/18 1000  ethambutol (MYAMBUTOL) tablet 1,000 mg    Note to Pharmacy:  TAKE TWO AND ONE-HALF TABLETS BY MOUTH ONCE DAILY     1,000 mg Oral Daily 09/05/18 0115     09/05/18 1000  Isavuconazonium Sulfate CAPS 372 mg     372 mg Oral Daily 09/05/18 0115         Objective: Physical Exam: Vitals:   09/10/18  0820 09/10/18 2342 09/11/18 0727 09/11/18 0745  BP: 118/82 117/72 126/80 129/71  Pulse: 85 70 61 66  Resp:  _0 Temp: 98.6  F (37 C) 98.9 F (37.2 C) 98.4 F (36.9 C) 98.2 F (36.8 C)  TempSrc: Oral Oral Oral Oral  SpO2: 99% 100% 100% 100%  Weight:      Height:        Intake/Output Summary (Last 24 hours) at 09/11/2018 0759 Last data filed at 09/11/2018 0555 Gross per 24 hour  Intake -  Output 465 ml  Net -465 ml   Filed Weights   09/04/18 1330  Weight: 51.3 kg   General: Alert, Awake and Oriented to Time, Place and Person. Appear in mild distress, affect appropriate Eyes: PERRL, Conjunctiva normal ENT: Oral Mucosa clear moist. Neck: no JVD, no Abnormal Mass Or lumps Cardiovascular: S1 and S2 Present, no Murmur, Peripheral Pulses Present Respiratory: normal respiratory effort, Bilateral Air entry equal and Decreased, no use of accessory muscle, Clear to Auscultation, no Crackles, no wheezes Abdomen: Bowel Sound present, Soft and no tenderness, no hernia Skin: no redness, no Rash, no induration Extremities: no Pedal edema, no calf tenderness Neurologic: Grossly no focal neuro deficit. Bilaterally Equal motor strength  Data Reviewed: CBC: Recent Labs  Lab 09/04/18 1356 09/05/18 0229 09/08/18 1239  WBC 10.2 8.9 10.1  NEUTROABS 9.7*  --   --   HGB 15.2 13.6 15.5  HCT 44.7 41.4 45.8  MCV 94.5 95.6 93.7  PLT 187 195 217   Basic Metabolic Panel: Recent Labs  Lab 09/04/18 1356 09/05/18 0229 09/08/18 1239  NA 137 139 133*  K 3.6 3.6 4.8  CL 102 104 94*  CO2 _1 GLUCOSE 140* 121* 117*  BUN 9 <5* 13  CREATININE 0.70 0.74 0.76  CALCIUM 8.6* 8.4* 8.7*    Liver Function Tests: Recent Labs  Lab 09/04/18 1356  AST 24  ALT 17  ALKPHOS 93  BILITOT 0.7  PROT 7.3  ALBUMIN 3.9   No results for input(s): LIPASE, AMYLASE in the last 168 hours. No results for input(s): AMMONIA in the last 168 hours. Coagulation Profile: Recent Labs  Lab 09/05/18 0229  INR 1.14   Cardiac Enzymes: No results for input(s): CKTOTAL, CKMB, CKMBINDEX, TROPONINI in the last 168 hours. BNP (last  3 results) No results for input(s): PROBNP in the last 8760 hours. CBG: No results for input(s): GLUCAP in the last 168 hours. Studies: No results found.   Time spent: 35 minutes  Author: Berle Mull, MD [facility] Pager: [phone] 09/11/2018 7:59 AM  Between 7PM-7AM, please contact night-coverage at [email or web address], password [email or web address]

## 2018-09-11 NOTE — Anesthesia Preprocedure Evaluation (Addendum)
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   Pulmonary exam normal  + decreased breath sounds      Cardiovascular negative cardio ROS Normal cardiovascular exam Rhythm:Regular Rate:Normal     Neuro/Psych negative neurological ROS  negative psych ROS   GI/Hepatic Neg liver ROS, GERD  Medicated and Controlled,  Endo/Other  Hypothyroidism Hx/o Grave's disease S/P RAI Severe PCM  Renal/GU negative Renal ROS  negative genitourinary   Musculoskeletal Osteoporosis   Abdominal   Peds  Hematology  (+) anemia ,   Anesthesia Other Findings   Reproductive/Obstetrics                           Anesthesia Physical Anesthesia Plan  ASA: III  Anesthesia Plan: General   Post-op Pain Management:    Induction: Intravenous  PONV Risk Score and Plan: 3 and Midazolam, Dexamethasone, Ondansetron and Treatment may vary due to age or medical condition  Airway Management Planned: Double Lumen EBT  Additional Equipment: Arterial line and CVP  Intra-op Plan:   Post-operative Plan: Extubation in OR  Informed Consent: I have reviewed the patients History and Physical, chart, labs and discussed the procedure including the risks, benefits and alternatives for the proposed anesthesia with the patient or authorized representative who has indicated his/her understanding and acceptance.     Plan Discussed with: CRNA and Surgeon  Anesthesia Plan Comments:         Anesthesia Quick Evaluation

## 2018-09-11 NOTE — Progress Notes (Signed)
Procedure(s) (LRB): VIDEO ASSISTED THORACOSCOPY (Right) STAPLING OF BLEBS (Right) Subjective: No complaints  Objective: Vital signs in last 24 hours: Temp:  [98.2 F (36.8 C)-98.9 F (37.2 C)] 98.2 F (36.8 C) (10/24 0745) Pulse Rate:  [61-78] 78 (10/24 0901) Cardiac Rhythm: Normal sinus rhythm (10/24 0700) Resp:  [16-18] 18 (10/24 0901) BP: (117-129)/(71-80) 129/71 (10/24 0745) SpO2:  [97 %-100 %] 97 % (10/24 0901)  Hemodynamic parameters for last 24 hours:    Intake/Output from previous day: 10/23 0701 - 10/24 0700 In: -  Out: 465 [Urine:450; Chest Tube:15] Intake/Output this shift: No intake/output data recorded.  General appearance: alert, cooperative and no distress Neurologic: intact Heart: regular rate and rhythm Lungs: clear to auscultation bilaterally no air leak  Lab Results: Recent Labs    09/08/18 1239  WBC 10.1  HGB 15.5  HCT 45.8  PLT 244   BMET:  Recent Labs    09/08/18 1239  NA 133*  K 4.8  CL 94*  CO2 29  GLUCOSE 117*  BUN 13  CREATININE 0.76  CALCIUM 8.7*    PT/INR: No results for input(s): LABPROT, INR in the last 72 hours. ABG    Component Value Date/Time   PHART 7.393 12/23/2010 0408   HCO3 28.5 (H) 12/23/2010 0408   TCO2 26 10/13/2015 0420   ACIDBASEDEF 2.0 12/22/2010 1522   O2SAT 98.0 12/23/2010 0408   CBG (last 3)  No results for input(s): GLUCAP in the last 72 hours.  Assessment/Plan: S/P Procedure(s) (LRB): VIDEO ASSISTED THORACOSCOPY (Right) STAPLING OF BLEBS (Right) -Right spontaneous pneumothorax with recurrence < 24 hours after chest tube removal. His lung is reexpanded with no air leak. We have discussed the options of tube removal and observation v VATS. In my opinion he has an extremely high risk of recurrence if tube removed. He has tolerated large pneumothoraces without serious compromise, so tube removal is a reasonable approach, although I favor VATS for definitive treatment.  I discussed the general  nature of the procedure with him. He understands the need for general anesthesia, the incisions to be used, the need for a chest tube postoperatively. I have informed him of the indications, risks, benefits and alternatives. He understands the risks include but are not limited to death, MI, DVT, PE, bleeding, possible need for transfusion, infection, prolonged air leak, respiratory failure, as well as other unforeseeable complications.  He accepts the risks and agrees to proceed   LOS: 7 days    Melrose Nakayama 09/11/2018

## 2018-09-11 NOTE — H&P (View-Only) (Signed)
Procedure(s) (LRB): VIDEO ASSISTED THORACOSCOPY (Right) STAPLING OF BLEBS (Right) Subjective: No complaints  Objective: Vital signs in last 24 hours: Temp:  [98.2 F (36.8 C)-98.9 F (37.2 C)] 98.2 F (36.8 C) (10/24 0745) Pulse Rate:  [61-78] 78 (10/24 0901) Cardiac Rhythm: Normal sinus rhythm (10/24 0700) Resp:  [16-18] 18 (10/24 0901) BP: (117-129)/(71-80) 129/71 (10/24 0745) SpO2:  [97 %-100 %] 97 % (10/24 0901)  Hemodynamic parameters for last 24 hours:    Intake/Output from previous day: 10/23 0701 - 10/24 0700 In: -  Out: 465 [Urine:450; Chest Tube:15] Intake/Output this shift: No intake/output data recorded.  General appearance: alert, cooperative and no distress Neurologic: intact Heart: regular rate and rhythm Lungs: clear to auscultation bilaterally no air leak  Lab Results: Recent Labs    09/08/18 1239  WBC 10.1  HGB 15.5  HCT 45.8  PLT 244   BMET:  Recent Labs    09/08/18 1239  NA 133*  K 4.8  CL 94*  CO2 29  GLUCOSE 117*  BUN 13  CREATININE 0.76  CALCIUM 8.7*    PT/INR: No results for input(s): LABPROT, INR in the last 72 hours. ABG    Component Value Date/Time   PHART 7.393 12/23/2010 0408   HCO3 28.5 (H) 12/23/2010 0408   TCO2 26 10/13/2015 0420   ACIDBASEDEF 2.0 12/22/2010 1522   O2SAT 98.0 12/23/2010 0408   CBG (last 3)  No results for input(s): GLUCAP in the last 72 hours.  Assessment/Plan: S/P Procedure(s) (LRB): VIDEO ASSISTED THORACOSCOPY (Right) STAPLING OF BLEBS (Right) -Right spontaneous pneumothorax with recurrence < 24 hours after chest tube removal. His lung is reexpanded with no air leak. We have discussed the options of tube removal and observation v VATS. In my opinion he has an extremely high risk of recurrence if tube removed. He has tolerated large pneumothoraces without serious compromise, so tube removal is a reasonable approach, although I favor VATS for definitive treatment.  I discussed the general  nature of the procedure with him. He understands the need for general anesthesia, the incisions to be used, the need for a chest tube postoperatively. I have informed him of the indications, risks, benefits and alternatives. He understands the risks include but are not limited to death, MI, DVT, PE, bleeding, possible need for transfusion, infection, prolonged air leak, respiratory failure, as well as other unforeseeable complications.  He accepts the risks and agrees to proceed   LOS: 7 days    Melrose Nakayama 09/11/2018

## 2018-09-12 ENCOUNTER — Inpatient Hospital Stay (HOSPITAL_COMMUNITY): Payer: BLUE CROSS/BLUE SHIELD

## 2018-09-12 ENCOUNTER — Encounter (HOSPITAL_COMMUNITY)
Admission: EM | Disposition: A | Discharge status: Home Health | Payer: Self-pay | Source: Home / Self Care | Attending: Internal Medicine

## 2018-09-12 ENCOUNTER — Inpatient Hospital Stay (HOSPITAL_COMMUNITY): Payer: BLUE CROSS/BLUE SHIELD | Admitting: Certified Registered Nurse Anesthetist

## 2018-09-12 HISTORY — PX: VIDEO ASSISTED THORACOSCOPY: SHX5073

## 2018-09-12 HISTORY — PX: STAPLING OF BLEBS: SHX6429

## 2018-09-12 LAB — CBC
HCT: 39.8 % (ref 39.0–52.0)
HEMOGLOBIN: 13.5 g/dL (ref 13.0–17.0)
MCH: 31.6 pg (ref 26.0–34.0)
MCHC: 33.9 g/dL (ref 30.0–36.0)
MCV: 93.2 fL (ref 80.0–100.0)
NRBC: 0 % (ref 0.0–0.2)
Platelets: 267 10*3/uL (ref 150–400)
RBC: 4.27 MIL/uL (ref 4.22–5.81)
RDW: 13 % (ref 11.5–15.5)
WBC: 5.8 10*3/uL (ref 4.0–10.5)

## 2018-09-12 LAB — BASIC METABOLIC PANEL
Anion gap: 10 (ref 5–15)
BUN: 12 mg/dL (ref 6–20)
CHLORIDE: 98 mmol/L (ref 98–111)
CO2: 27 mmol/L (ref 22–32)
Calcium: 8.4 mg/dL — ABNORMAL LOW (ref 8.9–10.3)
Creatinine, Ser: 0.67 mg/dL (ref 0.61–1.24)
GFR calc Af Amer: >60 mL/min (ref >60)
GFR calc non Af Amer: >60 mL/min (ref >60)
Glucose, Bld: 84 mg/dL (ref 70–99)
POTASSIUM: 3.9 mmol/L (ref 3.5–5.1)
SODIUM: 135 mmol/L (ref 135–145)

## 2018-09-12 SURGERY — VIDEO ASSISTED THORACOSCOPY
Anesthesia: General | Site: Chest | Laterality: Right

## 2018-09-12 MED ORDER — ONDANSETRON HCL 4 MG/2ML IJ SOLN
INTRAMUSCULAR | Status: AC
  Filled 2018-09-12: qty 2

## 2018-09-12 MED ORDER — MEPERIDINE HCL 50 MG/ML IJ SOLN: 6.2500 mg | INTRAMUSCULAR | Status: DC | PRN

## 2018-09-12 MED ORDER — CEFAZOLIN SODIUM-DEXTROSE 2-4 GM/100ML-% IV SOLN
2.0000 g | Freq: Three times a day (TID) | INTRAVENOUS | Status: AC
  Administered 2018-09-12 – 2018-09-13 (×2): 2 g via INTRAVENOUS
  Filled 2018-09-12 (×2): qty 100

## 2018-09-12 MED ORDER — ONDANSETRON HCL 4 MG/2ML IJ SOLN: 4.0000 mg | Freq: Four times a day (QID) | INTRAMUSCULAR | Status: DC | PRN

## 2018-09-12 MED ORDER — BUPIVACAINE LIPOSOME 1.3 % IJ SUSP
INTRAMUSCULAR | Status: DC | PRN
  Administered 2018-09-12: 75 mL

## 2018-09-12 MED ORDER — ROCURONIUM BROMIDE 50 MG/5ML IV SOSY
PREFILLED_SYRINGE | INTRAVENOUS | Status: AC
  Filled 2018-09-12: qty 5

## 2018-09-12 MED ORDER — PHENYLEPHRINE 40 MCG/ML (10ML) SYRINGE FOR IV PUSH (FOR BLOOD PRESSURE SUPPORT)
PREFILLED_SYRINGE | INTRAVENOUS | Status: AC
  Filled 2018-09-12: qty 10

## 2018-09-12 MED ORDER — DEXAMETHASONE SODIUM PHOSPHATE 10 MG/ML IJ SOLN
INTRAMUSCULAR | Status: AC
  Filled 2018-09-12: qty 1

## 2018-09-12 MED ORDER — SENNOSIDES-DOCUSATE SODIUM 8.6-50 MG PO TABS
1.0000 | ORAL_TABLET | Freq: Every day | ORAL | Status: DC
  Administered 2018-09-13 – 2018-10-05 (×15): 1 via ORAL
  Filled 2018-09-12 (×18): qty 1

## 2018-09-12 MED ORDER — ONDANSETRON HCL 4 MG/2ML IJ SOLN
4.0000 mg | Freq: Once | INTRAMUSCULAR | Status: AC | PRN
  Administered 2018-09-12: 4 mg via INTRAVENOUS

## 2018-09-12 MED ORDER — ALBUTEROL SULFATE (2.5 MG/3ML) 0.083% IN NEBU: 2.5000 mg | INHALATION_SOLUTION | Freq: Four times a day (QID) | RESPIRATORY_TRACT | Status: DC

## 2018-09-12 MED ORDER — PHENYLEPHRINE 40 MCG/ML (10ML) SYRINGE FOR IV PUSH (FOR BLOOD PRESSURE SUPPORT)
PREFILLED_SYRINGE | INTRAVENOUS | Status: DC | PRN
  Administered 2018-09-12 (×3): 40 ug via INTRAVENOUS

## 2018-09-12 MED ORDER — BUPIVACAINE LIPOSOME 1.3 % IJ SUSP
20.0000 mL | Freq: Once | INTRAMUSCULAR | Status: DC
  Filled 2018-09-12: qty 20

## 2018-09-12 MED ORDER — NALOXONE HCL 0.4 MG/ML IJ SOLN
0.4000 mg | INTRAMUSCULAR | Status: DC | PRN
  Filled 2018-09-12: qty 1

## 2018-09-12 MED ORDER — LACTATED RINGERS IV SOLN
INTRAVENOUS | Status: DC | PRN
  Administered 2018-09-12: 07:00:00 via INTRAVENOUS

## 2018-09-12 MED ORDER — 0.9 % SODIUM CHLORIDE (POUR BTL) OPTIME
TOPICAL | Status: DC | PRN
  Administered 2018-09-12: 2000 mL

## 2018-09-12 MED ORDER — HYDROMORPHONE 1 MG/ML IV SOLN
INTRAVENOUS | Status: DC
  Administered 2018-09-12: 1 mg via INTRAVENOUS
  Administered 2018-09-12: 0.3 mg via INTRAVENOUS
  Administered 2018-09-12: 0 mg via INTRAVENOUS
  Administered 2018-09-12: 1.8 mg via INTRAVENOUS
  Administered 2018-09-13: 0.3 mg via INTRAVENOUS
  Administered 2018-09-15: 1.8 mg via INTRAVENOUS
  Administered 2018-09-15: 0.3 mg via INTRAVENOUS
  Administered 2018-09-16: 1.5 mg via INTRAVENOUS
  Administered 2018-09-16: 0.3 mg via INTRAVENOUS
  Administered 2018-09-16: 0.6 mg via INTRAVENOUS
  Administered 2018-09-16: 0 mg via INTRAVENOUS
  Administered 2018-09-16: 0.6 mg via INTRAVENOUS
  Administered 2018-09-16: 0.3 mg via INTRAVENOUS
  Administered 2018-09-16: 0.9 mg via INTRAVENOUS
  Administered 2018-09-17: 0.3 mg via INTRAVENOUS
  Administered 2018-09-17: 0 mg via INTRAVENOUS
  Administered 2018-09-17 (×2): 0.3 mg via INTRAVENOUS
  Administered 2018-09-18: 1 mg via INTRAVENOUS
  Administered 2018-09-18: 1 mL via INTRAVENOUS
  Administered 2018-09-18: 0 mg via INTRAVENOUS
  Administered 2018-09-18: 0.6 mg via INTRAVENOUS
  Administered 2018-09-19: 0 mg via INTRAVENOUS
  Administered 2018-09-19: 0.3 mg via INTRAVENOUS
  Administered 2018-09-19 – 2018-09-20 (×2): 0 mg via INTRAVENOUS
  Administered 2018-09-20: 1 mg via INTRAVENOUS
  Administered 2018-09-20: 0 mg via INTRAVENOUS
  Administered 2018-09-20 – 2018-09-21 (×3): 0.3 mg via INTRAVENOUS
  Administered 2018-09-21: 0 mg via INTRAVENOUS
  Administered 2018-09-21: 0.6 mg via INTRAVENOUS
  Administered 2018-09-21: 0.3 mg via INTRAVENOUS
  Administered 2018-09-21: 0.6 mg via INTRAVENOUS
  Administered 2018-09-21: 0.9 mg via INTRAVENOUS
  Administered 2018-09-22 (×3): 0.3 mg via INTRAVENOUS
  Administered 2018-09-22: 0.389 mg via INTRAVENOUS
  Administered 2018-09-23: 0 mg via INTRAVENOUS
  Administered 2018-09-23: 0.9 mg via INTRAVENOUS
  Administered 2018-09-24 (×3): 0 mg via INTRAVENOUS
  Administered 2018-09-24 (×2): 0.3 mg via INTRAVENOUS
  Administered 2018-09-24: 0 mg via INTRAVENOUS
  Administered 2018-09-25: 0.6 mg via INTRAVENOUS
  Administered 2018-09-25: 0 mg via INTRAVENOUS
  Administered 2018-09-25: 1.2 mg via INTRAVENOUS
  Administered 2018-09-26: 0.6 mg via INTRAVENOUS
  Administered 2018-09-26 (×2): 0.3 mg via INTRAVENOUS
  Administered 2018-09-26: 0.6 mg via INTRAVENOUS
  Administered 2018-09-26: 0.3 mg via INTRAVENOUS
  Administered 2018-09-26: 0 mg via INTRAVENOUS
  Administered 2018-09-27: 0 mL via INTRAVENOUS
  Administered 2018-09-27: 0.3 mg via INTRAVENOUS
  Administered 2018-09-27 – 2018-09-28 (×4): 0 mg via INTRAVENOUS
  Administered 2018-09-28: 0 mL via INTRAVENOUS
  Administered 2018-09-28 – 2018-09-30 (×5): 0 mg via INTRAVENOUS
  Filled 2018-09-12 (×3): qty 25

## 2018-09-12 MED ORDER — ACETAMINOPHEN 500 MG PO TABS
1000.0000 mg | ORAL_TABLET | Freq: Four times a day (QID) | ORAL | Status: AC
  Administered 2018-09-12 – 2018-09-17 (×17): 1000 mg via ORAL
  Filled 2018-09-12 (×18): qty 2

## 2018-09-12 MED ORDER — FENTANYL CITRATE (PF) 250 MCG/5ML IJ SOLN
INTRAMUSCULAR | Status: AC
  Filled 2018-09-12: qty 5

## 2018-09-12 MED ORDER — DIPHENHYDRAMINE HCL 12.5 MG/5ML PO ELIX
12.5000 mg | ORAL_SOLUTION | Freq: Four times a day (QID) | ORAL | Status: DC | PRN
  Filled 2018-09-12: qty 5

## 2018-09-12 MED ORDER — MIDAZOLAM HCL 2 MG/2ML IJ SOLN
INTRAMUSCULAR | Status: AC
  Filled 2018-09-12: qty 2

## 2018-09-12 MED ORDER — PROPOFOL 10 MG/ML IV BOLUS
INTRAVENOUS | Status: AC
  Filled 2018-09-12: qty 20

## 2018-09-12 MED ORDER — BUPIVACAINE HCL (PF) 0.25 % IJ SOLN
INTRAMUSCULAR | Status: AC
  Filled 2018-09-12: qty 30

## 2018-09-12 MED ORDER — HYDROMORPHONE HCL 1 MG/ML IJ SOLN: 0.2500 mg | INTRAMUSCULAR | Status: DC | PRN

## 2018-09-12 MED ORDER — ACETAMINOPHEN 160 MG/5ML PO SOLN: 1000.0000 mg | Freq: Four times a day (QID) | ORAL | Status: AC

## 2018-09-12 MED ORDER — DEXAMETHASONE SODIUM PHOSPHATE 10 MG/ML IJ SOLN
INTRAMUSCULAR | Status: DC | PRN
  Administered 2018-09-12: 10 mg via INTRAVENOUS

## 2018-09-12 MED ORDER — OXYCODONE HCL 5 MG PO TABS
5.0000 mg | ORAL_TABLET | ORAL | Status: DC | PRN
  Administered 2018-09-20: 5 mg via ORAL
  Administered 2018-09-24 – 2018-10-06 (×35): 10 mg via ORAL
  Filled 2018-09-12: qty 2
  Filled 2018-09-12: qty 1
  Filled 2018-09-12 (×34): qty 2

## 2018-09-12 MED ORDER — ALBUTEROL SULFATE (2.5 MG/3ML) 0.083% IN NEBU: 2.5000 mg | INHALATION_SOLUTION | RESPIRATORY_TRACT | Status: DC

## 2018-09-12 MED ORDER — ONDANSETRON HCL 4 MG/2ML IJ SOLN
INTRAMUSCULAR | Status: DC | PRN
  Administered 2018-09-12: 4 mg via INTRAVENOUS

## 2018-09-12 MED ORDER — LACTATED RINGERS IV SOLN
INTRAVENOUS | Status: DC | PRN
  Administered 2018-09-12: 08:00:00 via INTRAVENOUS

## 2018-09-12 MED ORDER — LIDOCAINE 2% (20 MG/ML) 5 ML SYRINGE
INTRAMUSCULAR | Status: AC
  Filled 2018-09-12: qty 5

## 2018-09-12 MED ORDER — PROPOFOL 10 MG/ML IV BOLUS
INTRAVENOUS | Status: DC | PRN
  Administered 2018-09-12: 130 mg via INTRAVENOUS

## 2018-09-12 MED ORDER — BUPIVACAINE HCL (PF) 0.5 % IJ SOLN
INTRAMUSCULAR | Status: AC
  Filled 2018-09-12: qty 30

## 2018-09-12 MED ORDER — SUGAMMADEX SODIUM 200 MG/2ML IV SOLN
INTRAVENOUS | Status: DC | PRN
  Administered 2018-09-12: 50 mg via INTRAVENOUS
  Administered 2018-09-12: 100 mg via INTRAVENOUS
  Administered 2018-09-12: 50 mg via INTRAVENOUS

## 2018-09-12 MED ORDER — POTASSIUM CHLORIDE IN NACL 20-0.45 MEQ/L-% IV SOLN
INTRAVENOUS | Status: DC
  Administered 2018-09-12 – 2018-09-13 (×2): via INTRAVENOUS
  Filled 2018-09-12 (×3): qty 1000

## 2018-09-12 MED ORDER — POTASSIUM CHLORIDE 10 MEQ/50ML IV SOLN: 10.0000 meq | Freq: Every day | INTRAVENOUS | Status: DC | PRN

## 2018-09-12 MED ORDER — LIDOCAINE 2% (20 MG/ML) 5 ML SYRINGE
INTRAMUSCULAR | Status: DC | PRN
  Administered 2018-09-12: 40 mg via INTRAVENOUS

## 2018-09-12 MED ORDER — MIDAZOLAM HCL 5 MG/5ML IJ SOLN
INTRAMUSCULAR | Status: DC | PRN
  Administered 2018-09-12 (×2): 1 mg via INTRAVENOUS

## 2018-09-12 MED ORDER — SODIUM CHLORIDE 0.9% FLUSH
9.0000 mL | INTRAVENOUS | Status: DC | PRN
  Administered 2018-09-14: 9 mL via INTRAVENOUS
  Filled 2018-09-12: qty 9

## 2018-09-12 MED ORDER — DIPHENHYDRAMINE HCL 50 MG/ML IJ SOLN
12.5000 mg | Freq: Four times a day (QID) | INTRAMUSCULAR | Status: DC | PRN
  Filled 2018-09-12: qty 0.25

## 2018-09-12 MED ORDER — ONDANSETRON HCL 4 MG/2ML IJ SOLN
4.0000 mg | Freq: Four times a day (QID) | INTRAMUSCULAR | Status: DC | PRN
  Filled 2018-09-12: qty 2

## 2018-09-12 MED ORDER — FENTANYL CITRATE (PF) 100 MCG/2ML IJ SOLN
INTRAMUSCULAR | Status: DC | PRN
  Administered 2018-09-12: 25 ug via INTRAVENOUS
  Administered 2018-09-12 (×4): 50 ug via INTRAVENOUS
  Administered 2018-09-12: 25 ug via INTRAVENOUS

## 2018-09-12 MED ORDER — ROCURONIUM BROMIDE 50 MG/5ML IV SOSY
PREFILLED_SYRINGE | INTRAVENOUS | Status: DC | PRN
  Administered 2018-09-12: 50 mg via INTRAVENOUS
  Administered 2018-09-12: 20 mg via INTRAVENOUS
  Administered 2018-09-12: 30 mg via INTRAVENOUS
  Administered 2018-09-12: 20 mg via INTRAVENOUS

## 2018-09-12 MED ORDER — BISACODYL 5 MG PO TBEC
10.0000 mg | DELAYED_RELEASE_TABLET | Freq: Every day | ORAL | Status: DC
  Administered 2018-09-12 – 2018-09-22 (×9): 10 mg via ORAL
  Filled 2018-09-12 (×10): qty 2

## 2018-09-12 MED ORDER — ENOXAPARIN SODIUM 40 MG/0.4ML ~~LOC~~ SOLN
40.0000 mg | Freq: Every day | SUBCUTANEOUS | Status: DC
  Administered 2018-09-13 – 2018-09-18 (×6): 40 mg via SUBCUTANEOUS
  Filled 2018-09-12 (×6): qty 0.4

## 2018-09-12 SURGICAL SUPPLY — 83 items
APPLICATOR COTTON TIP 6 STRL (MISCELLANEOUS) IMPLANT
APPLICATOR COTTON TIP 6IN STRL (MISCELLANEOUS) IMPLANT
APPLIER CLIP ROT 10 11.4 M/L (STAPLE)
CANISTER SUCT 3000ML PPV (MISCELLANEOUS) ×3 IMPLANT
CATH THORACIC 28FR (CATHETERS) ×2 IMPLANT
CATH THORACIC 28FR RT ANG (CATHETERS) IMPLANT
CATH THORACIC 36FR (CATHETERS) IMPLANT
CATH THORACIC 36FR RT ANG (CATHETERS) IMPLANT
CLIP APPLIE ROT 10 11.4 M/L (STAPLE) IMPLANT
CLIP VESOCCLUDE MED 6/CT (CLIP) IMPLANT
CONN Y 3/8X3/8X3/8  BEN (MISCELLANEOUS) ×2
CONN Y 3/8X3/8X3/8 BEN (MISCELLANEOUS) ×1 IMPLANT
CONT SPEC 4OZ CLIKSEAL STRL BL (MISCELLANEOUS) ×8 IMPLANT
COVER SURGICAL LIGHT HANDLE (MISCELLANEOUS) ×3 IMPLANT
COVER WAND RF STERILE (DRAPES) ×3 IMPLANT
DECANTER SPIKE VIAL GLASS SM (MISCELLANEOUS) ×2 IMPLANT
DERMABOND ADHESIVE PROPEN (GAUZE/BANDAGES/DRESSINGS) ×2
DERMABOND ADVANCED (GAUZE/BANDAGES/DRESSINGS)
DERMABOND ADVANCED .7 DNX12 (GAUZE/BANDAGES/DRESSINGS) IMPLANT
DERMABOND ADVANCED .7 DNX6 (GAUZE/BANDAGES/DRESSINGS) IMPLANT
DRAIN CHANNEL 28F RND 3/8 FF (WOUND CARE) IMPLANT
DRAIN CHANNEL 32F RND 10.7 FF (WOUND CARE) IMPLANT
DRAPE LAPAROSCOPIC ABDOMINAL (DRAPES) ×3 IMPLANT
DRAPE SLUSH/WARMER DISC (DRAPES) ×3 IMPLANT
ELECT REM PT RETURN 9FT ADLT (ELECTROSURGICAL) ×3
ELECTRODE REM PT RTRN 9FT ADLT (ELECTROSURGICAL) ×1 IMPLANT
GAUZE SPONGE 4X4 12PLY STRL (GAUZE/BANDAGES/DRESSINGS) ×3 IMPLANT
GAUZE XEROFORM 1X8 LF (GAUZE/BANDAGES/DRESSINGS) ×2 IMPLANT
GLOVE SS BIOGEL STRL SZ 7 (GLOVE) IMPLANT
GLOVE SUPERSENSE BIOGEL SZ 7 (GLOVE) ×2
GLOVE SURG SIGNA 7.5 PF LTX (GLOVE) ×6 IMPLANT
GOWN STRL REUS W/ TWL LRG LVL3 (GOWN DISPOSABLE) ×2 IMPLANT
GOWN STRL REUS W/ TWL XL LVL3 (GOWN DISPOSABLE) ×1 IMPLANT
GOWN STRL REUS W/TWL LRG LVL3 (GOWN DISPOSABLE) ×4
GOWN STRL REUS W/TWL XL LVL3 (GOWN DISPOSABLE) ×2
HANDLE STAPLE ENDO GIA SHORT (STAPLE) ×2
HEMOSTAT SURGICEL 2X14 (HEMOSTASIS) IMPLANT
IV CATH 22GX1 FEP (IV SOLUTION) ×2 IMPLANT
KIT BASIN OR (CUSTOM PROCEDURE TRAY) ×3 IMPLANT
KIT SUCTION CATH 14FR (SUCTIONS) ×3 IMPLANT
KIT TURNOVER KIT B (KITS) ×3 IMPLANT
NS IRRIG 1000ML POUR BTL (IV SOLUTION) ×6 IMPLANT
PACK CHEST (CUSTOM PROCEDURE TRAY) ×3 IMPLANT
PAD ARMBOARD 7.5X6 YLW CONV (MISCELLANEOUS) ×6 IMPLANT
POUCH ENDO CATCH II 15MM (MISCELLANEOUS) IMPLANT
POUCH SPECIMEN RETRIEVAL 10MM (ENDOMECHANICALS) IMPLANT
RELOAD STAPLE 45 PURP MED/THCK (STAPLE) IMPLANT
RELOAD TRI 45 ART MED THCK PUR (STAPLE) ×18 IMPLANT
RELOAD TRI 60 ART MED THCK BLK (STAPLE) ×2 IMPLANT
RELOAD TRI 60 ART MED THCK PUR (STAPLE) ×2 IMPLANT
SEALANT PROGEL (MISCELLANEOUS) IMPLANT
SEALANT SURG COSEAL 4ML (VASCULAR PRODUCTS) IMPLANT
SEALANT SURG COSEAL 8ML (VASCULAR PRODUCTS) IMPLANT
SOLUTION ANTI FOG 6CC (MISCELLANEOUS) ×3 IMPLANT
SPECIMEN JAR MEDIUM (MISCELLANEOUS) ×3 IMPLANT
SPONGE INTESTINAL PEANUT (DISPOSABLE) IMPLANT
SPONGE TONSIL TAPE 1 RFD (DISPOSABLE) ×3 IMPLANT
STAPLER ENDO GIA 12 SHRT THIN (STAPLE) IMPLANT
STAPLER ENDO GIA 12MM SHORT (STAPLE) ×1 IMPLANT
SUT PROLENE 4 0 RB 1 (SUTURE)
SUT PROLENE 4-0 RB1 .5 CRCL 36 (SUTURE) IMPLANT
SUT SILK  1 MH (SUTURE) ×6
SUT SILK 1 MH (SUTURE) ×2 IMPLANT
SUT SILK 2 0SH CR/8 30 (SUTURE) IMPLANT
SUT SILK 3 0SH CR/8 30 (SUTURE) IMPLANT
SUT VIC AB 1 CTX 36 (SUTURE)
SUT VIC AB 1 CTX36XBRD ANBCTR (SUTURE) IMPLANT
SUT VIC AB 2-0 CTX 36 (SUTURE) IMPLANT
SUT VIC AB 2-0 UR6 27 (SUTURE) IMPLANT
SUT VIC AB 3-0 MH 27 (SUTURE) IMPLANT
SUT VIC AB 3-0 X1 27 (SUTURE) ×3 IMPLANT
SUT VICRYL 2 TP 1 (SUTURE) IMPLANT
SYR 20CC LL (SYRINGE) ×2 IMPLANT
SYSTEM SAHARA CHEST DRAIN ATS (WOUND CARE) ×3 IMPLANT
TAPE CLOTH 4X10 WHT NS (GAUZE/BANDAGES/DRESSINGS) ×3 IMPLANT
TAPE CLOTH SURG 4X10 WHT LF (GAUZE/BANDAGES/DRESSINGS) ×2 IMPLANT
TIP APPLICATOR SPRAY EXTEND 16 (VASCULAR PRODUCTS) IMPLANT
TOWEL GREEN STERILE (TOWEL DISPOSABLE) ×3 IMPLANT
TOWEL GREEN STERILE FF (TOWEL DISPOSABLE) ×3 IMPLANT
TRAY FOLEY MTR SLVR 16FR STAT (SET/KITS/TRAYS/PACK) ×3 IMPLANT
TROCAR XCEL BLADELESS 5X75MML (TROCAR) ×3 IMPLANT
TROCAR XCEL NON-BLD 5MMX100MML (ENDOMECHANICALS) IMPLANT
WATER STERILE IRR 1000ML POUR (IV SOLUTION) ×6 IMPLANT

## 2018-09-12 NOTE — Interval H&P Note (Signed)
History and Physical Interval Note:  09/12/2018 7:29 AM  [name]  has presented today for surgery, with the diagnosis of RIGHT PTX BLEBS  The various methods of treatment have been discussed with the patient and family. After consideration of risks, benefits and other options for treatment, the patient has consented to  Procedure(s): VIDEO ASSISTED THORACOSCOPY (Right) STAPLING OF BLEBS (Right) as a surgical intervention .  The patient's history has been reviewed, patient examined, no change in status, stable for surgery.  I have reviewed the patient's chart and labs.  Questions were answered to the patient's satisfaction.     Melrose Nakayama

## 2018-09-12 NOTE — Anesthesia Postprocedure Evaluation (Addendum)
Anesthesia Post Note  Patient: [name]  Procedure(s) Performed: VIDEO ASSISTED THORACOSCOPY, right lung (Right Chest) STAPLING OF BLEBS, right lower and middle lung lobes (Right Chest)     Patient location during evaluation: PACU Anesthesia Type: General Level of consciousness: awake and alert and oriented Pain management: pain level controlled Vital Signs Assessment: post-procedure vital signs reviewed and stable Respiratory status: spontaneous breathing, nonlabored ventilation, respiratory function stable and patient connected to nasal cannula oxygen Cardiovascular status: blood pressure returned to baseline and stable Postop Assessment: no apparent nausea or vomiting Anesthetic complications: no Comments: CXR done in PACU shows bilateral apical bullous disease, right perihilar infiltrate, Thoracostomy tube in good position, presence of SQ air right.    Last Vitals:  Vitals:   09/12/18 1034 09/12/18 1100  BP: 119/82 123/88  Pulse: 90 80  Resp: 20 (!) 22  Temp: 36.5 C   SpO2: 98% 100%    Last Pain:  Vitals:   09/12/18 1034  TempSrc:   PainSc: 7                  Fisher Hargadon A.

## 2018-09-12 NOTE — Brief Op Note (Signed)
09/04/2018 - 09/12/2018  9:57 AM  PATIENT:  [name]  42 y.o. male  PRE-OPERATIVE DIAGNOSIS:  RIGHT PNEUMOTHORAX SECONDARY TO  BLEBS  POST-OPERATIVE DIAGNOSIS:  RIGHT PNEUMOTHORAX SECONDARY TO  BLEBS  PROCEDURE:  Procedure(s): VIDEO ASSISTED THORACOSCOPY (Right) STAPLING OF BLEBS (Right)  SURGEON:  Surgeon(s) and Role:    * Melrose Nakayama, MD - Primary  PHYSICIAN ASSISTANT:   ASSISTANTS: Delena Serve, RNFA   ANESTHESIA:   general  EBL:  25 mL   BLOOD ADMINISTERED:none  DRAINS: 58F Chest Tube(s) in the right   LOCAL MEDICATIONS USED: 20 ml liposomal bupivicaine, 30 ml 0.5% bupivicaine  SPECIMEN:  Source of Specimen:  blebs  DISPOSITION OF SPECIMEN:  PATHOLOGY  COUNTS:  YES  TOURNIQUET:  * No tourniquets in log *  DICTATION: .Other Dictation: Dictation Number -  PLAN OF CARE: Admit to inpatient   PATIENT DISPOSITION:  PACU - hemodynamically stable.   Delay start of Pharmacological VTE agent (>24hrs) due to surgical blood loss or risk of bleeding: no

## 2018-09-12 NOTE — Op Note (Signed)
NAME: [name], [name] MEDICAL RECORD [id number] ACCOUNT [id number] DATE OF BIRTH:12-06-1975 FACILITY: [facility] LOCATION: [facility] PHYSICIAN:Keegen Heffern Chaya Jan, MD  OPERATIVE REPORT  DATE OF PROCEDURE:  09/12/2018  PREOPERATIVE DIAGNOSIS:  Right spontaneous pneumothorax.  POSTOPERATIVE DIAGNOSIS:  Right spontaneous pneumothorax.  PROCEDURE:   Right video-assisted thoracoscopy,  Multiple bleb resections, Pleural abrasion and Intercostal nerve blocks.  SURGEON:  Modesto Charon, MD  ASSISTANT:  Olin Pia, RNFA.  ANESTHESIA:  General.  FINDINGS:  Multiple large blebs in the lower and middle lobes.  Upper lobe densely adherent to apical chest wall.  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  He presented with a right spontaneous pneumothorax.  His air leak resolved quickly and the chest  tube was removed, but less than 24 hours later, he had a recurrent right pneumothorax.  He was advised to undergo a right VATS for bleb resection to decrease his risk for recurrent pneumothorax in the future.  The indications, risks, benefits, and  alternatives were discussed in detail with the patient.  He understood and accepted the risks and agreed to proceed.  OPERATIVE NOTE:  The patient was brought to the preoperative holding area on 09/12/2018.  Anesthesia placed an arterial blood pressure monitoring line and established peripheral venous access.  He was taken to the operating room, anesthetized and intubated with a  double lumen endotracheal tube.  Intravenous antibiotics were administered.  A Foley catheter was placed.  Sequential compression devices were placed on the calves for DVT prophylaxis.  He was placed in a left lateral decubitus position.  Single lung  ventilation of the left lung was initiated and was tolerated well throughout the procedure.  The right chest tube  was removed.  The right chest was then prepped and draped in the usual sterile fashion.  A timeout was performed.  A solution containing 20 mL of liposomal bupivacaine, 30 mL of 0.5% bupivacaine and 50 mL of saline was prepared.  This was used for local anesthesia at the incision sites as well as for the intercostal nerve blocks.  The site  for port placement in the 7th interspace was injected.  A small incision was made and a 5 mm port was placed into the chest.  The thoracoscope was advanced into the chest.  There was good isolation of the right lung, although it was relatively slow to  deflate.  With suction applied through the endotracheal tube, the right lung did deflate. A site was selected for incision in the fifth interspace anterolaterally.  This area was anesthetized with the bupivacaine solution and then a 5 cm  working incision was made.  No rib spreading was performed during the procedure.  Intercostal nerve blocks were performed from the third to the ninth interspaces with 5 mL of the solution being injected into each of these interspaces in a subpleural  plane.  There were extensive adhesions of the upper lobe to the apical chest wall.  No attempt was made to take these adhesions down.  There were numerous blebs noted in the lower and middle lobes.  The middle lobe blebs were mostly inferior and anterior.  In the lower  lobe, there were posterior, inferior, and medial blebs.  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  The larger blebs were resected.  All bleb resections were performed with the Covidien stapler with Peri-Strip reinforcement of the staple lines.   Multiple blebs were removed  with each firing of the stapler.  A total of 3 areas of blebs were removed from the lower lobe and 2 from the middle lobe.  There was good hemostasis at all the staple lines.  All the specimens were sent for permanent  pathology.  The chest was copiously irrigated with warm saline.  A test  inflation showed some air leak around the staple lines.  Again all of the lung was emphysematous but there were no other major blebs noted.  A 28 French chest tube was placed through  the original port incision and secured with a #1 silk suture.  The right lung was reinflated.  The working incision was closed in standard fashion.  The chest tube was placed to suction.  The patient was placed back in the supine position.  He was then  extubated in the operating room and taken to the [facility] in good condition.  TN/NUANCE  D:09/12/2018 T:09/12/2018 JOB:[id number]

## 2018-09-12 NOTE — Anesthesia Procedure Notes (Addendum)
Procedure Name: Intubation Date/Time: 09/12/2018 7:57 AM Performed by: Trinna Post., CRNA Pre-anesthesia Checklist: Patient identified, Emergency Drugs available, Suction available, Patient being monitored and Timeout performed Patient Re-evaluated:Patient Re-evaluated prior to induction Oxygen Delivery Method: Circle system utilized Preoxygenation: Pre-oxygenation with 100% oxygen Induction Type: IV induction Ventilation: Mask ventilation without difficulty Laryngoscope Size: Mac and 4 Grade View: Grade I Endobronchial tube: Left, Double lumen EBT, EBT position confirmed by fiberoptic bronchoscope and EBT position confirmed by auscultation and 37 Fr Number of attempts: 1 Airway Equipment and Method: Stylet Placement Confirmation: ETT inserted through vocal cords under direct vision,  positive ETCO2 and breath sounds checked- equal and bilateral Secured at: 30 cm Tube secured with: Tape Dental Injury: Teeth and Oropharynx as per pre-operative assessment

## 2018-09-12 NOTE — Transfer of Care (Signed)
Immediate Anesthesia Transfer of Care Note  Patient: [name]  Procedure(s) Performed: VIDEO ASSISTED THORACOSCOPY, right lung (Right Chest) STAPLING OF BLEBS, right lower and middle lung lobes (Right Chest)  Patient Location: PACU  Anesthesia Type:General  Level of Consciousness: awake, alert  and oriented  Airway & Oxygen Therapy: Patient Spontanous Breathing and Patient connected to face mask oxygen  Post-op Assessment: Report given to RN and Post -op Vital signs reviewed and stable  Post vital signs: Reviewed and stable  Last Vitals:  Vitals Value Taken Time  BP 84/60 09/12/2018 10:32 AM  Temp    Pulse 90 09/12/2018 10:33 AM  Resp 16 09/12/2018 10:33 AM  SpO2 99 % 09/12/2018 10:33 AM  Vitals shown include unvalidated device data.  Last Pain:  Vitals:   09/12/18 0548  TempSrc:   PainSc: Asleep      Patients Stated Pain Goal: 2 (23/36/12 2449)  Complications: No apparent anesthesia complications

## 2018-09-12 NOTE — Anesthesia Procedure Notes (Signed)
Arterial Line Insertion Start/End10/25/2019 7:38 AM Performed by: Josephine Igo, CRNA, CRNA  Preanesthetic checklist: patient identified, IV checked, site marked, risks and benefits discussed, surgical consent, monitors and equipment checked, pre-op evaluation, timeout performed and anesthesia consent Lidocaine 1% used for infiltration and patient sedated Left, radial was placed Catheter size: 20 G Hand hygiene performed  and maximum sterile barriers used   Attempts: 5 or more (Attempted by multiple CRNAs and MDA on both L and R sides before success) Procedure performed without using ultrasound guided technique. Following insertion, dressing applied and Biopatch. Post procedure assessment: normal and unchanged  Patient tolerated the procedure well with no immediate complications.

## 2018-09-12 NOTE — Progress Notes (Signed)
[facility] Progress Note  Patient: [name] JME:[id number]   PCP: Wallene Dales, MD DOB: July 06, 1976   DOA: 09/04/2018   DOS: 09/12/2018   Date of Service: the patient was seen and examined on 09/12/2018  Brief hospital course: Pt. with PMH of asthma, GERD, hypothyroidism, aspergilloma, left upper lobe segmentectomy for aspergillomaMAI, bullous lung disease; admitted on 09/04/2018, presented with complaint of shortnes of breath, was found to have tension pneumothorax. CT chest showed tension PTX with bullous emphysematous changes, CVTS was consulted right-sided chest tube was placed urgently.PTX resolved with chest tube, this was removed 10/20, later on 10/21 worsening dyspnea, repeat CXR with recurrent tension PTX, required another urgent chest tube 10/21 -CVTS following Currently underwent VATS on 10/25.  Subjective: seen after the procedure, no acute complains, tolerated the procedure well   Assessment and Plan: 1. Recurrent Tension pneumothorax: -Chest tube placed urgently 10/17 per Dr. Roxan Hockey -Had a chest tube placed to suction followed by waterseal over the weekend, without leak, chest tube was subsequently removed 10/20 afternoon  -Repeat chest x-ray was stable without pneumothorax then later 10/21 developed another spontaneous tension PTX -chest tube placed again by Dr.Hendrickson 10/21 at bedside -S/p VATS STAPLING OF BLEBS (Right).  -Management per cardiothoracic surgery.   2. History of MAI -Continue home regimen of ethambutol and azithromycin -Followed by infectious disease  3. History of aspergilloma -Remote left upper lobe segmentectomy -Continue antifungal, cressemba -Followed by [place] [facility] and infectious disease for this -Continue prednisone 5 mg daily  4. Hx ofSevere persistent asthma: No wheezing or rhonchi on auscultation. -Continue bronchodilators  5. Hypothyroidism -Continue Synthroid  6. Iron deficiency anemia: Continue home  iron supplement Hb stable  7. Body mass index is 17.7 kg/m.  Severe Malnutrition Etiology: chronic illness(bullous lung disease ) Interventions: Interventions: Ensure Enlive (each supplement provides 350kcal and 20 grams of protein)  Diet: regular diet DVT Prophylaxis: subcutaneous Heparin  Advance goals of care discussion: full code  Family Communication: no family was present at bedside, at the time of interview.   Disposition:  Discharge to home.  Consultants: cardiothoracic surgery  Procedures: chest tube insertion x2, VATS  Scheduled Meds: . acetaminophen  1,000 mg Oral Q6H   Or  . acetaminophen (TYLENOL) oral liquid 160 mg/5 mL  1,000 mg Oral Q6H  . albuterol  2.5 mg Nebulization Q4H while awake  . bisacodyl  10 mg Oral Daily  . [START ON 09/13/2018] enoxaparin (LOVENOX) injection  40 mg Subcutaneous Daily  . ethambutol  1,000 mg Oral Daily  . feeding supplement (ENSURE ENLIVE)  237 mL Per Tube TID BM  . fluticasone furoate-vilanterol  1 puff Inhalation Daily  . HYDROmorphone   Intravenous Q4H  . Isavuconazonium Sulfate  372 mg Oral Daily  . levothyroxine  75 mcg Oral QAC breakfast  . ondansetron      . predniSONE  5 mg Oral Q breakfast  . senna-docusate  1 tablet Oral QHS  . umeclidinium bromide  1 puff Inhalation Daily   Continuous Infusions: . 0.45 % NaCl with KCl 20 mEq / L 100 mL/hr at 09/12/18 1708  .  ceFAZolin (ANCEF) IV    . potassium chloride     PRN Meds: diphenhydrAMINE **OR** diphenhydrAMINE, guaiFENesin-dextromethorphan, naloxone **AND** sodium chloride flush, ondansetron (ZOFRAN) IV, oxyCODONE, polyethylene glycol, potassium chloride, zolpidem Antibiotics: Anti-infectives (From admission, onward)   Start     Dose/Rate Route Frequency Ordered Stop   09/12/18 1700  ceFAZolin (ANCEF) IVPB 2g/100 mL premix  2 g 200 mL/hr over 30 Minutes Intravenous Every 8 hours 09/12/18 1544 09/13/18 0859   09/12/18 0645  ceFAZolin (ANCEF) IVPB 2g/100 mL  premix     2 g 200 mL/hr over 30 Minutes Intravenous 30 min pre-op 09/11/18 2006 09/12/18 0837   09/05/18 1000  azithromycin (ZITHROMAX) tablet 500 mg  Status:  Discontinued     500 mg Oral Daily 09/05/18 0115 09/12/18 1544   09/05/18 1000  ethambutol (MYAMBUTOL) tablet 1,000 mg    Note to Pharmacy:  TAKE TWO AND ONE-HALF TABLETS BY MOUTH ONCE DAILY     1,000 mg Oral Daily 09/05/18 0115     09/05/18 1000  Isavuconazonium Sulfate CAPS 372 mg     372 mg Oral Daily 09/05/18 0115         Objective: Physical Exam: Vitals:   09/12/18 1445 09/12/18 1500 09/12/18 1520 09/12/18 1710  BP: 107/75     Pulse: 75 72 77   Resp: (!) 8 (!) 7 (!) 3 12  Temp: (!) 97.3 F (36.3 C)     TempSrc:      SpO2: 100% 100% 100% 99%  Weight:      Height:        Intake/Output Summary (Last 24 hours) at 09/12/2018 1717 Last data filed at 09/12/2018 1445 Gross per 24 hour  Intake 1000 ml  Output 575 ml  Net 425 ml   Filed Weights   09/04/18 1330  Weight: 51.3 kg   General: Alert, Awake. Appear in mild distress, affect appropriate ENT: Oral Mucosa clear moist. Neck: no JVD, no Abnormal Mass Or lumps Cardiovascular: S1 and S2 Present, no Murmur, Peripheral Pulses Present Respiratory: increased respiratory effort, Bilateral Air entry equal and Decreased, no use of accessory muscle, bilateral Crackles, no wheezes Abdomen: Bowel Sound present, Soft and no tenderness, no hernia Skin: no redness, no Rash, no induration Extremities: no Pedal edema, no calf tenderness Neurologic: Grossly no focal neuro deficit. Bilaterally Equal motor strength  Data Reviewed: CBC: Recent Labs  Lab 09/08/18 1239 09/11/18 1601 09/12/18 0059  WBC 10.1 7.0 5.8  HGB 15.5 13.7 13.5  HCT 45.8 42.8 39.8  MCV 93.7 94.9 93.2  PLT 244 291 272   Basic Metabolic Panel: Recent Labs  Lab 09/08/18 1239 09/12/18 0059  NA 133* 135  K 4.8 3.9  CL 94* 98  CO2 29 27  GLUCOSE 117* 84  BUN 13 12  CREATININE 0.76 0.67    CALCIUM 8.7* 8.4*    Liver Function Tests: No results for input(s): AST, ALT, ALKPHOS, BILITOT, PROT, ALBUMIN in the last 168 hours. No results for input(s): LIPASE, AMYLASE in the last 168 hours. No results for input(s): AMMONIA in the last 168 hours. Coagulation Profile: Recent Labs  Lab 09/11/18 1601  INR 1.05   Cardiac Enzymes: No results for input(s): CKTOTAL, CKMB, CKMBINDEX, TROPONINI in the last 168 hours. BNP (last 3 results) No results for input(s): PROBNP in the last 8760 hours. CBG: No results for input(s): GLUCAP in the last 168 hours. Studies: Dg Chest Port 1 View  Result Date: 09/12/2018 CLINICAL DATA:  42 year old male with a history VATS EXAM: PORTABLE CHEST 1 VIEW COMPARISON:  09/10/2018, 09/09/2018 FINDINGS: Cardiomediastinal silhouette unchanged in size and contour. Surgical changes in the right infrahilar region, new from the comparison. Interval removal of pigtail thoracostomy tube in placement of large bore right-sided thoracostomy tube. Gas along the right chest wall and at the base the right neck. Advanced bullous changes of the bilateral  upper lungs with architectural distortion and interstitial opacities bilaterally. No large pleural effusion. No visualized pneumothorax. IMPRESSION: Interval surgical changes of right VATS and resection, with placement of large-bore thoracostomy tube and no evidence hydropneumothorax. Advanced bullous changes of the bilateral lungs again demonstrated with associated scarring. Electronically Signed   By: Corrie Mckusick D.O.   On: 09/12/2018 13:14     Time spent: 35 minutes  Author: Berle Mull, MD [facility] Pager: [phone] 09/12/2018 5:17 PM  Between 7PM-7AM, please contact night-coverage at [email or web address], password [email or web address]

## 2018-09-12 NOTE — Plan of Care (Signed)
  Problem: Education: Goal: Knowledge of General Education information will improve Description Including pain rating scale, medication(s)/side effects and non-pharmacologic comfort measures Outcome: Progressing   Problem: Health Behavior/Discharge Planning: Goal: Ability to manage health-related needs will improve Outcome: Progressing   Problem: Clinical Measurements: Goal: Ability to maintain clinical measurements within normal limits will improve Outcome: Progressing Goal: Will remain free from infection Outcome: Progressing Goal: Diagnostic test results will improve Outcome: Progressing Goal: Respiratory complications will improve Outcome: Progressing Goal: Cardiovascular complication will be avoided Outcome: Progressing   Problem: Activity: Goal: Risk for activity intolerance will decrease Outcome: Progressing   Problem: Nutrition: Goal: Adequate nutrition will be maintained Outcome: Progressing   Problem: Coping: Goal: Level of anxiety will decrease Outcome: Progressing   Problem: Elimination: Goal: Will not experience complications related to bowel motility Outcome: Progressing Goal: Will not experience complications related to urinary retention Outcome: Progressing   Problem: Pain Managment: Goal: General experience of comfort will improve Outcome: Progressing   Problem: Safety: Goal: Ability to remain free from injury will improve Outcome: Progressing   Problem: Skin Integrity: Goal: Risk for impaired skin integrity will decrease Outcome: Progressing

## 2018-09-13 ENCOUNTER — Encounter (HOSPITAL_COMMUNITY): Payer: Self-pay | Admitting: Thoracic Surgery (Cardiothoracic Vascular Surgery)

## 2018-09-13 ENCOUNTER — Inpatient Hospital Stay (HOSPITAL_COMMUNITY): Payer: BLUE CROSS/BLUE SHIELD

## 2018-09-13 LAB — BLOOD GAS, ARTERIAL
Acid-Base Excess: 4.2 mmol/L — ABNORMAL HIGH (ref 0.0–2.0)
Bicarbonate: 28.4 mmol/L — ABNORMAL HIGH (ref 20.0–28.0)
O2 Saturation: 99.2 %
PCO2 ART: 43.8 mmHg (ref 32.0–48.0)
PH ART: 7.427 (ref 7.350–7.450)
Patient temperature: 98.6
pO2, Arterial: 152 mmHg — ABNORMAL HIGH (ref 83.0–108.0)

## 2018-09-13 LAB — BASIC METABOLIC PANEL
ANION GAP: 8 (ref 5–15)
BUN: 7 mg/dL (ref 6–20)
CALCIUM: 8.2 mg/dL — AB (ref 8.9–10.3)
CO2: 27 mmol/L (ref 22–32)
Chloride: 100 mmol/L (ref 98–111)
Creatinine, Ser: 0.55 mg/dL — ABNORMAL LOW (ref 0.61–1.24)
GFR calc Af Amer: >60 mL/min (ref >60)
Glucose, Bld: 94 mg/dL (ref 70–99)
POTASSIUM: 4.2 mmol/L (ref 3.5–5.1)
SODIUM: 135 mmol/L (ref 135–145)

## 2018-09-13 LAB — CBC
HCT: 38.5 % — ABNORMAL LOW (ref 39.0–52.0)
Hemoglobin: 12.7 g/dL — ABNORMAL LOW (ref 13.0–17.0)
MCH: 31 pg (ref 26.0–34.0)
MCHC: 33 g/dL (ref 30.0–36.0)
MCV: 93.9 fL (ref 80.0–100.0)
NRBC: 0 % (ref 0.0–0.2)
PLATELETS: 330 10*3/uL (ref 150–400)
RBC: 4.1 MIL/uL — AB (ref 4.22–5.81)
RDW: 12.8 % (ref 11.5–15.5)
WBC: 9.7 10*3/uL (ref 4.0–10.5)

## 2018-09-13 MED ORDER — ALBUTEROL SULFATE (2.5 MG/3ML) 0.083% IN NEBU: 2.5000 mg | INHALATION_SOLUTION | Freq: Four times a day (QID) | RESPIRATORY_TRACT | Status: DC | PRN

## 2018-09-13 NOTE — Progress Notes (Signed)
[facility] Progress Note  Patient: [name] FGH:[id number]   PCP: Wallene Dales, MD DOB: [birth year]   DOA: 09/04/2018   DOS: 09/13/2018   Date of Service: the patient was seen and examined on 09/13/2018  Brief hospital course: Pt. with PMH of asthma, GERD, hypothyroidism, aspergilloma, left upper lobe segmentectomy for aspergillomaMAI, bullous lung disease; admitted on 09/04/2018, presented with complaint of shortnes of breath, was found to have tension pneumothorax. CT chest showed tension PTX with bullous emphysematous changes, CVTS was consulted right-sided chest tube was placed urgently.PTX resolved with chest tube, this was removed 10/20, later on 10/21 worsening dyspnea, repeat CXR with recurrent tension PTX, required another urgent chest tube 10/21 -CVTS following Currently underwent VATS on 10/25.  Subjective: Feeling better, pain is well controlled with the PCA.  No nausea no vomiting no fever no chills.  Assessment and Plan: 1. Recurrent Tension pneumothorax: -Chest tube placed urgently 10/17 per Dr. Roxan Hockey -Had a chest tube placed to suction followed by waterseal over the weekend, without leak, chest tube was subsequently removed 10/20 afternoon  -Repeat chest x-ray was stable without pneumothorax then later 10/21 developed another spontaneous tension PTX -chest tube placed again by Dr.Hendrickson 10/21 at bedside -S/p VATS STAPLING OF BLEBS (Right).  -Management per cardiothoracic surgery.  -Stop IV fluids.  2. History of MAI -Continue home regimen of ethambutol and azithromycin -Followed by infectious disease  3. History of aspergilloma -Remote left upper lobe segmentectomy -Continue antifungal, cressemba -Followed by [facility] infectious [facility] for this -Continue prednisone 5 mg daily  4. Hx ofSevere persistent asthma: No wheezing or rhonchi on auscultation. -Continue bronchodilators  5. Hypothyroidism -Continue Synthroid  6.  Iron deficiency anemia: Continue home iron supplement Hb stable  7. Body mass index is 17.7 kg/m.  Severe Malnutrition Etiology: chronic illness(bullous lung disease ) Interventions: Interventions: Ensure Enlive (each supplement provides 350kcal and 20 grams of protein)  Diet: regular diet DVT Prophylaxis: subcutaneous Heparin  Advance goals of care discussion: full code  Family Communication: no family was present at bedside, at the time of interview.   Disposition:  Discharge to home.  Consultants: cardiothoracic surgery  Procedures: chest tube insertion x2, VATS  Scheduled Meds: . acetaminophen  1,000 mg Oral Q6H   Or  . acetaminophen (TYLENOL) oral liquid 160 mg/5 mL  1,000 mg Oral Q6H  . bisacodyl  10 mg Oral Daily  . enoxaparin (LOVENOX) injection  40 mg Subcutaneous Daily  . ethambutol  1,000 mg Oral Daily  . feeding supplement (ENSURE ENLIVE)  237 mL Per Tube TID BM  . fluticasone furoate-vilanterol  1 puff Inhalation Daily  . HYDROmorphone   Intravenous Q4H  . Isavuconazonium Sulfate  372 mg Oral Daily  . levothyroxine  75 mcg Oral QAC breakfast  . predniSONE  5 mg Oral Q breakfast  . senna-docusate  1 tablet Oral QHS  . umeclidinium bromide  1 puff Inhalation Daily   Continuous Infusions: . potassium chloride     PRN Meds: albuterol, diphenhydrAMINE **OR** diphenhydrAMINE, guaiFENesin-dextromethorphan, naloxone **AND** sodium chloride flush, ondansetron (ZOFRAN) IV, oxyCODONE, polyethylene glycol, potassium chloride, zolpidem Antibiotics: Anti-infectives (From admission, onward)   Start     Dose/Rate Route Frequency Ordered Stop   09/12/18 1700  ceFAZolin (ANCEF) IVPB 2g/100 mL premix     2 g 200 mL/hr over 30 Minutes Intravenous Every 8 hours 09/12/18 1544 09/13/18 0106   09/12/18 0645  ceFAZolin (ANCEF) IVPB 2g/100 mL premix     2 g 200 mL/hr  over 30 Minutes Intravenous 30 min pre-op 09/11/18 2006 09/12/18 0837   09/05/18 1000  azithromycin (ZITHROMAX)  tablet 500 mg  Status:  Discontinued     500 mg Oral Daily 09/05/18 0115 09/12/18 1544   09/05/18 1000  ethambutol (MYAMBUTOL) tablet 1,000 mg    Note to Pharmacy:  TAKE TWO AND ONE-HALF TABLETS BY MOUTH ONCE DAILY     1,000 mg Oral Daily 09/05/18 0115     09/05/18 1000  Isavuconazonium Sulfate CAPS 372 mg     372 mg Oral Daily 09/05/18 0115         Objective: Physical Exam: Vitals:   09/13/18 1141 09/13/18 1143 09/13/18 1200 09/13/18 1250  BP:      Pulse:      Resp: (!) 23  (!) 23 18  Temp:      TempSrc:      SpO2: 100% 98% 98% 98%  Weight:      Height:        Intake/Output Summary (Last 24 hours) at 09/13/2018 1320 Last data filed at 09/13/2018 1138 Gross per 24 hour  Intake 1664.85 ml  Output 1820 ml  Net -155.15 ml   Filed Weights   09/04/18 1330  Weight: 51.3 kg   General: Alert, Awake. Appear in mild distress, affect appropriate ENT: Oral Mucosa clear moist. Neck: no JVD, no Abnormal Mass Or lumps Cardiovascular: S1 and S2 Present, no Murmur, Peripheral Pulses Present Respiratory: increased respiratory effort, Bilateral Air entry equal and Decreased, no use of accessory muscle, bilateral Crackles, no wheezes Abdomen: Bowel Sound present, Soft and no tenderness, no hernia Skin: no redness, no Rash, no induration Extremities: no Pedal edema, no calf tenderness Neurologic: Grossly no focal neuro deficit. Bilaterally Equal motor strength  Data Reviewed: CBC: Recent Labs  Lab 09/08/18 1239 09/11/18 1601 09/12/18 0059 09/13/18 0504  WBC 10.1 7.0 5.8 9.7  HGB 15.5 13.7 13.5 12.7*  HCT 45.8 42.8 39.8 38.5*  MCV 93.7 94.9 93.2 93.9  PLT 244 291 267 169   Basic Metabolic Panel: Recent Labs  Lab 09/08/18 1239 09/12/18 0059 09/13/18 0504  NA 133* 135 135  K 4.8 3.9 4.2  CL 94* 98 100  CO2 _0 GLUCOSE 117* 84 94  BUN _1 CREATININE 0.76 0.67 0.55*  CALCIUM 8.7* 8.4* 8.2*    Liver Function Tests: No results for input(s): AST, ALT,  ALKPHOS, BILITOT, PROT, ALBUMIN in the last 168 hours. No results for input(s): LIPASE, AMYLASE in the last 168 hours. No results for input(s): AMMONIA in the last 168 hours. Coagulation Profile: Recent Labs  Lab 09/11/18 1601  INR 1.05   Cardiac Enzymes: No results for input(s): CKTOTAL, CKMB, CKMBINDEX, TROPONINI in the last 168 hours. BNP (last 3 results) No results for input(s): PROBNP in the last 8760 hours. CBG: No results for input(s): GLUCAP in the last 168 hours. Studies: Dg Chest Port 1 View  Result Date: 09/13/2018 CLINICAL DATA:  42 year old male postoperative day 1 status post right VATS, bleb resection. Chronic mycobacterium avium complex infection. EXAM: PORTABLE CHEST 1 VIEW COMPARISON:  09/12/2018 and earlier. FINDINGS: Portable AP semi upright view at 0541 hours. Chronic bilateral perihilar pulmonary architectural distortion with apical bullous disease. Staple lines now visible in the right upper lung and along the right hilum. Stable right chest tube. Increased right chest wall and neck subcutaneous gas. No definite pneumothorax. Stable cardiac size and mediastinal contours. Stable ventilation since yesterday. IMPRESSION: 1. Stable right chest tube  and postoperative changes in the right lung with increased chest wall and neck subcutaneous gas but no definite acute pneumothorax. 2. Underlying severe chronic lung disease with perihilar architectural distortion and apical bullae. Electronically Signed   By: Genevie Ann M.D.   On: 09/13/2018 08:50     Time spent: 35 minutes  Author: Berle Mull, MD [facility] Pager: [phone] 09/13/2018 1:20 PM  Between 7PM-7AM, please contact night-coverage at [email or web address], password [facility]

## 2018-09-13 NOTE — Progress Notes (Addendum)
[address]       [place] [address]             [phone]      1 Day Post-Op Procedure(s) (LRB): VIDEO ASSISTED THORACOSCOPY, right lung (Right) STAPLING OF BLEBS, right lower and middle lung lobes (Right) Subjective: Feels okay this morning. Appetite is coming back   Objective: Vital signs in last 24 hours: Temp:  [97.3 F (36.3 C)-98.4 F (36.9 C)] 97.9 F (36.6 C) (10/26 0700) Pulse Rate:  [61-85] 62 (10/26 0700) Cardiac Rhythm: Normal sinus rhythm (10/26 0800) Resp:  [3-22] 20 (10/26 0800) BP: (89-130)/(67-88) 106/69 (10/26 0700) SpO2:  [0 %-100 %] 0 % (10/26 0800) Arterial Line BP: (125-154)/(65-80) 133/70 (10/25 1520) FiO2 (%):  [0 %] 0 % (10/26 0800)     Intake/Output from previous day: 10/25 0701 - 10/26 0700 In: 2124.9 [I.V.:1924.9; IV Piggyback:200] Out: 1295 [Urine:1200; Blood:25; Chest Tube:70] Intake/Output this shift: Total I/O In: 540 [P.O.:240; I.V.:300] Out: 400 [Urine:400]  General appearance: alert, cooperative and no distress Heart: regular rate and rhythm, S1, S2 normal, no murmur, click, rub or gallop Lungs: clear to auscultation bilaterally Abdomen: soft, non-tender; bowel sounds normal; no masses,  no organomegaly Extremities: extremities normal, atraumatic, no cyanosis or edema Wound: clean and dry, some subq air around the chest tube site  Lab Results: Recent Labs    09/12/18 0059 09/13/18 0504  WBC 5.8 9.7  HGB 13.5 12.7*  HCT 39.8 38.5*  PLT 267 330   BMET:  Recent Labs    09/12/18 0059 09/13/18 0504  NA 135 135  K 3.9 4.2  CL 98 100  CO2 27 27  GLUCOSE 84 94  BUN 12 7  CREATININE 0.67 0.55*  CALCIUM 8.4* 8.2*    PT/INR:  Recent Labs    09/11/18 1601  LABPROT 13.6  INR 1.05   ABG    Component Value Date/Time   PHART 7.427 09/13/2018 0445   HCO3 28.4 (H) 09/13/2018 0445   TCO2 26 10/13/2015 0420   ACIDBASEDEF 2.0 12/22/2010 1522   O2SAT 99.2 09/13/2018 0445   CBG (last 3)  No  results for input(s): GLUCAP in the last 72 hours.  Assessment/Plan: S/P Procedure(s) (LRB): VIDEO ASSISTED THORACOSCOPY, right lung (Right) STAPLING OF BLEBS, right lower and middle lung lobes (Right)  1. CV-NSR in the 60s, BP stable.  2. Pulm- tolerating 2L [place] with good oxygen saturation. CXR today showed: Stable right chest tube and postoperative changes in the right lung with increased chest wall and neck subcutaneous gas but no definite acute pneumothorax. Keep to suction today 3. Renal-creatinine 0.55, electrolytes okay 4. H and H stable 5. Blood glucose well controlled.   Plan: Discontinue arterial line, foley catheter, and change IV fluids to KVO. Ambulate. Wean oxygen as tolerated. Will monitor subq air-on present along axilla. Large air leak-keep chest tube in place and on suction.    LOS: 9 days    [name] 09/13/2018 Leave chest tube in place to suction Follow-up x-ray in a.m. patient examined and medical record reviewed,agree with above note. [name] 09/13/2018

## 2018-09-13 NOTE — Progress Notes (Signed)
Physical Therapy Treatment Patient Details Name: [name] MRN: [id number] DOB: June 23, 1976 Today's Date: 09/13/2018    History of Present Illness 42 y.o.malewith medical history significant ofasthma, GERD, hypothyroidism, aspergilloma,left upper lobe segmentectomy for aspergilloma/MAI,lung bullous lung disease,who presented with shortness of breath. Dx of tension pneumothorax, chest tube placed urgently.  VATS 09/12/18.     PT Comments    Pt admitted with above diagnosis. Pt currently with functional limitations due to balance and endurance deficits.  Pt was able to ambulate in room with suction continuous therefore limited to room. Pt with less dyspnea today than last treatment and went further distance.  Will continue to progress as pt tolerates.  Pt will benefit from skilled PT to increase their independence and safety with mobility to allow discharge to the venue listed below.     Follow Up Recommendations  Home health PT     Equipment Recommendations  (TBD)    Recommendations for Other Services       Precautions / Restrictions Precautions Precautions: Other (comment) Precaution Comments: monitor HR/O2, denies h/o falls Restrictions Weight Bearing Restrictions: No Other Position/Activity Restrictions: R chest tube with continuous suction    Mobility  Bed Mobility Overal bed mobility: Modified Independent             General bed mobility comments: used rail, increased time, 1/4 dyspnea   Transfers Overall transfer level: Modified independent Equipment used: None             General transfer comment:  supervision given for safety and lines  Ambulation/Gait Ambulation/Gait assistance: Supervision Gait Distance (Feet): 80 Feet Assistive device: None Gait Pattern/deviations: Trunk flexed;Step-through pattern Gait velocity: WFL Gait velocity interpretation: <1.31 ft/sec, indicative of household ambulator General Gait Details: pt connected to  continuous wall suction, Pt able to ambulate laps in room due to having to stay on continuous wall suction.  2/4 DOE.     Stairs             Wheelchair Mobility    Modified Rankin (Stroke Patients Only)       Balance Overall balance assessment: Needs assistance Sitting-balance support: No upper extremity supported Sitting balance-Leahy Scale: Normal     Standing balance support: No upper extremity supported Standing balance-Leahy Scale: Good Standing balance comment: limitations evident with high level balance activities                            Cognition Arousal/Alertness: Awake/alert Behavior During Therapy: WFL for tasks assessed/performed Overall Cognitive Status: Within Functional Limits for tasks assessed                                        Exercises General Exercises - Lower Extremity Quad Sets: AROM;Both;5 reps Gluteal Sets: (verbally instructed pt in gluteal sets) Long Arc Quad: AROM;Both;10 reps;Seated    General Comments General comments (skin integrity, edema, etc.): Pt on RA with O2 sats >90%.  Other VSS with ambulation.       Pertinent Vitals/Pain Pain Assessment: No/denies pain    Home Living                      Prior Function            PT Goals (current goals can now be found in the care plan section) Progress towards PT goals: Progressing toward  goals    Frequency    Min 3X/week      PT Plan Current plan remains appropriate    Co-evaluation              AM-PAC PT "6 Clicks" Daily Activity  Outcome Measure  Difficulty turning over in bed (including adjusting bedclothes, sheets and blankets)?: A Little Difficulty moving from lying on back to sitting on the side of the bed? : A Little Difficulty sitting down on and standing up from a chair with arms (e.g., wheelchair, bedside commode, etc,.)?: A Little Help needed moving to and from a bed to chair (including a wheelchair)?: A  Little Help needed walking in hospital room?: A Little Help needed climbing 3-5 steps with a railing? : A Little 6 Click Score: 18    End of Session Equipment Utilized During Treatment: Gait belt Activity Tolerance: Patient limited by fatigue;Treatment limited secondary to medical complications (Comment) Patient left: in bed;with call bell/phone within reach Nurse Communication: Mobility status PT Visit Diagnosis: Difficulty in walking, not elsewhere classified (R26.2)     Time: 1420-1436 PT Time Calculation (min) (ACUTE ONLY): 16 min  Charges:  $Gait Training: 8-22 mins                     [place] Pager:  [phone]  Office:  [place] 09/13/2018, 3:41 PM

## 2018-09-14 ENCOUNTER — Inpatient Hospital Stay (HOSPITAL_COMMUNITY): Payer: BLUE CROSS/BLUE SHIELD

## 2018-09-14 LAB — CBC
HCT: 38.5 % — ABNORMAL LOW (ref 39.0–52.0)
HEMOGLOBIN: 12.6 g/dL — AB (ref 13.0–17.0)
MCH: 31 pg (ref 26.0–34.0)
MCHC: 32.7 g/dL (ref 30.0–36.0)
MCV: 94.6 fL (ref 80.0–100.0)
Platelets: 307 10*3/uL (ref 150–400)
RBC: 4.07 MIL/uL — ABNORMAL LOW (ref 4.22–5.81)
RDW: 12.8 % (ref 11.5–15.5)
WBC: 8.9 10*3/uL (ref 4.0–10.5)
nRBC: 0 % (ref 0.0–0.2)

## 2018-09-14 LAB — COMPREHENSIVE METABOLIC PANEL
ALK PHOS: 76 U/L (ref 38–126)
ALT: 20 U/L (ref 0–44)
ANION GAP: 10 (ref 5–15)
AST: 21 U/L (ref 15–41)
Albumin: 2.3 g/dL — ABNORMAL LOW (ref 3.5–5.0)
BILIRUBIN TOTAL: 0.5 mg/dL (ref 0.3–1.2)
BUN: 13 mg/dL (ref 6–20)
CALCIUM: 8.8 mg/dL — AB (ref 8.9–10.3)
CO2: 30 mmol/L (ref 22–32)
Chloride: 98 mmol/L (ref 98–111)
Creatinine, Ser: 0.83 mg/dL (ref 0.61–1.24)
GFR calc non Af Amer: >60 mL/min (ref >60)
Glucose, Bld: 99 mg/dL (ref 70–99)
Potassium: 3.9 mmol/L (ref 3.5–5.1)
SODIUM: 138 mmol/L (ref 135–145)
TOTAL PROTEIN: 5.5 g/dL — AB (ref 6.5–8.1)

## 2018-09-14 MED ORDER — AZITHROMYCIN 500 MG PO TABS
500.0000 mg | ORAL_TABLET | Freq: Every day | ORAL | Status: DC
  Administered 2018-09-14 – 2018-10-06 (×23): 500 mg via ORAL
  Filled 2018-09-14 (×23): qty 1

## 2018-09-14 NOTE — Progress Notes (Signed)
Patient ID: [name], male   DOB: [birth year], 42 y.o.   MRN: [id number]  PROGRESS NOTE    ABDULHADI STOPA  HAL:[id number] DOB: 1976/03/15 DOA: 09/04/2018 PCP: Wallene Dales, MD   Brief Narrative:  42 year old male with history of asthma, GERD, hypothyroidism, aspergilloma, left upper lobe segmentectomy for aspergilloma/MAI, bullous lung disease presented on 09/04/2018 with shortness of breath and was found to have tension pneumothorax.  CVTS was consulted, right-sided chest tube was placed urgently.  Pneumothorax resolved with chest tube, this was removed on 09/07/2018.  Patient was found to have worsening dyspnea on 09/08/2018 and repeat chest x-ray showed recurrent tension pneumothorax which required another urgent chest tube on 09/08/2018.  He underwent a VATS on 09/12/2018 by CVTS.   Assessment & Plan:   Principal Problem:   Tension pneumothorax Active Problems:   Mycobacterium avium complex ([place])   Invasive pulmonary aspergillosis (HCC)   Severe persistent asthma   Hypothyroidism   Iron deficiency anemia   Protein-calorie malnutrition, severe   Recurrent tension pneumothorax -Chest tube placed urgently on 09/04/2018 by Dr. Roxan Hockey which was subsequently removed on 09/07/2018 here to be chest x-ray was stable without pneumothorax -Patient became dyspneic again on 09/08/2018 and developed another spontaneous tension pneumothorax which required chest tube placement again by Dr. Roxan Hockey -Status post VATS and stapling of blebs in the right lower and middle lobes by CVTS -Patient still has chest tube which is being managed by CVTS -Respiratory status stable  History of MAI -Continue home regimen of a temporal and azithromycin.  Outpatient follow-up by infectious disease  History of aspergilloma -History of remote left upper lobe segmentectomy -Continue antifungal Cresemba -Outpatient follow-up with pulmonary and ID -continue prednisone 5 mg daily  History of severe  persistent asthma -Stable.  Continue bronchodilators  Hypothyroidism -Continue Synthroid  Iron deficiency anemia -Hemoglobin stable.  Continue home iron supplement  Severe malnutrition -Follow nutrition recommendations    DVT prophylaxis: Heparin Code Status: Full Family Communication: None at bedside Disposition Plan: Home once cleared by CVTS  Consultants: CVTS  Procedures: Chest tube insertion x2, VATS  Antimicrobials:   Ethambutol/Zithromax/Cresemba  Subjective: Patient seen and examined at bedside.  Denies worsening shortness of breath or chest pain.  No overnight fever or vomiting.  Objective: Vitals:   09/14/18 0502 09/14/18 0750 09/14/18 0800 09/14/18 0910  BP:  104/68    Pulse:  69    Resp: 19 (!) 28 (!) 22   Temp:  98.3 F (36.8 C)    TempSrc:  Oral    SpO2: 100% 93% 92% 95%  Weight:      Height:        Intake/Output Summary (Last 24 hours) at 09/14/2018 1015 Last data filed at 09/14/2018 0700 Gross per 24 hour  Intake 240 ml  Output 940 ml  Net -700 ml   Filed Weights   09/04/18 1330  Weight: 51.3 kg    Examination:  General exam: Appears calm and comfortable, no distress Respiratory system: Bilateral decreased breath sounds at bases.  Right-sided chest tube present Cardiovascular system: S1 & S2 heard, Rate controlled Gastrointestinal system: Abdomen is nondistended, soft and nontender. Normal bowel sounds heard. Extremities: No cyanosis, clubbing, edema   Data Reviewed: I have personally reviewed following labs and imaging studies  CBC: Recent Labs  Lab 09/08/18 1239 09/11/18 1601 09/12/18 0059 09/13/18 0504 09/14/18 0249  WBC 10.1 7.0 5.8 9.7 8.9  HGB 15.5 13.7 13.5 12.7* 12.6*  HCT 45.8 42.8 39.8 38.5* 38.5*  MCV 93.7 94.9 93.2 93.9 94.6  PLT 244 291 267 330 417   Basic Metabolic Panel: Recent Labs  Lab 09/08/18 1239 09/12/18 0059 09/13/18 0504 09/14/18 0249  NA 133* 135 135 138  K 4.8 3.9 4.2 3.9  CL 94* 98 100 98   CO2 _0 GLUCOSE 117* 84 94 99  BUN _1 CREATININE 0.76 0.67 0.55* 0.83  CALCIUM 8.7* 8.4* 8.2* 8.8*   GFR: Estimated Creatinine Clearance: 82.4 mL/min (by C-G formula based on SCr of 0.83 mg/dL). Liver Function Tests: Recent Labs  Lab 09/14/18 0249  AST 21  ALT 20  ALKPHOS 76  BILITOT 0.5  PROT 5.5*  ALBUMIN 2.3*   No results for input(s): LIPASE, AMYLASE in the last 168 hours. No results for input(s): AMMONIA in the last 168 hours. Coagulation Profile: Recent Labs  Lab 09/11/18 1601  INR 1.05   Cardiac Enzymes: No results for input(s): CKTOTAL, CKMB, CKMBINDEX, TROPONINI in the last 168 hours. BNP (last 3 results) No results for input(s): PROBNP in the last 8760 hours. HbA1C: No results for input(s): HGBA1C in the last 72 hours. CBG: No results for input(s): GLUCAP in the last 168 hours. Lipid Profile: No results for input(s): CHOL, HDL, LDLCALC, TRIG, CHOLHDL, LDLDIRECT in the last 72 hours. Thyroid Function Tests: No results for input(s): TSH, T4TOTAL, FREET4, T3FREE, THYROIDAB in the last 72 hours. Anemia Panel: No results for input(s): VITAMINB12, FOLATE, FERRITIN, TIBC, IRON, RETICCTPCT in the last 72 hours. Sepsis Labs: No results for input(s): PROCALCITON, LATICACIDVEN in the last 168 hours.  Recent Results (from the past 240 hour(s))  MRSA PCR Screening     Status: None   Collection Time: 09/04/18  9:46 PM  Result Value Ref Range Status   MRSA by PCR NEGATIVE NEGATIVE Final    Comment:        The GeneXpert MRSA Assay (FDA approved for NASAL specimens only), is one component of a comprehensive MRSA colonization surveillance program. It is not intended to diagnose MRSA infection nor to guide or monitor treatment for MRSA infections. Performed at [facility], [place] [address] [place], [place] [address]          Radiology Studies: Dg Chest Port 1 View  Result Date: 09/14/2018 CLINICAL DATA:  Followup right chest  tube. EXAM: PORTABLE CHEST 1 VIEW COMPARISON:  09/13/2018. FINDINGS: Normal sized heart. Stable large biapical bullae and bilateral surgical staples. A right chest tube remains in place with no interval pneumothorax seen. There is some increase in subcutaneous emphysema on the right and interval subcutaneous emphysema superiorly on the left. Unremarkable bones. IMPRESSION: 1. Increased subcutaneous emphysema on the right and interval subcutaneous emphysema superiorly on the left. 2. Stable large biapical bullae and surgical staples. 3. No pneumothorax seen. Electronically Signed   By: Claudie Revering M.D.   On: 09/14/2018 10:03   Dg Chest Port 1 View  Result Date: 09/13/2018 CLINICAL DATA:  42 year old male postoperative day 1 status post right VATS, bleb resection. Chronic mycobacterium avium complex infection. EXAM: PORTABLE CHEST 1 VIEW COMPARISON:  09/12/2018 and earlier. FINDINGS: Portable AP semi upright view at 0541 hours. Chronic bilateral perihilar pulmonary architectural distortion with apical bullous disease. Staple lines now visible in the right upper lung and along the right hilum. Stable right chest tube. Increased right chest wall and neck subcutaneous gas. No definite pneumothorax. Stable cardiac size and mediastinal contours. Stable ventilation since yesterday. IMPRESSION: 1. Stable right chest  tube and postoperative changes in the right lung with increased chest wall and neck subcutaneous gas but no definite acute pneumothorax. 2. Underlying severe chronic lung disease with perihilar architectural distortion and apical bullae. Electronically Signed   By: Genevie Ann M.D.   On: 09/13/2018 08:50   Dg Chest Port 1 View  Result Date: 09/12/2018 CLINICAL DATA:  42 year old male with a history VATS EXAM: PORTABLE CHEST 1 VIEW COMPARISON:  09/10/2018, 09/09/2018 FINDINGS: Cardiomediastinal silhouette unchanged in size and contour. Surgical changes in the right infrahilar region, new from the  comparison. Interval removal of pigtail thoracostomy tube in placement of large bore right-sided thoracostomy tube. Gas along the right chest wall and at the base the right neck. Advanced bullous changes of the bilateral upper lungs with architectural distortion and interstitial opacities bilaterally. No large pleural effusion. No visualized pneumothorax. IMPRESSION: Interval surgical changes of right VATS and resection, with placement of large-bore thoracostomy tube and no evidence hydropneumothorax. Advanced bullous changes of the bilateral lungs again demonstrated with associated scarring. Electronically Signed   By: Corrie Mckusick D.O.   On: 09/12/2018 13:14        Scheduled Meds: . acetaminophen  1,000 mg Oral Q6H   Or  . acetaminophen (TYLENOL) oral liquid 160 mg/5 mL  1,000 mg Oral Q6H  . bisacodyl  10 mg Oral Daily  . enoxaparin (LOVENOX) injection  40 mg Subcutaneous Daily  . ethambutol  1,000 mg Oral Daily  . feeding supplement (ENSURE ENLIVE)  237 mL Per Tube TID BM  . fluticasone furoate-vilanterol  1 puff Inhalation Daily  . HYDROmorphone   Intravenous Q4H  . Isavuconazonium Sulfate  372 mg Oral Daily  . levothyroxine  75 mcg Oral QAC breakfast  . predniSONE  5 mg Oral Q breakfast  . senna-docusate  1 tablet Oral QHS  . umeclidinium bromide  1 puff Inhalation Daily   Continuous Infusions: . potassium chloride       LOS: 10 days        Aline August, MD [facility] Pager [phone]  If 7PM-7AM, please contact night-coverage [email or web address] Password [email or web address] 09/14/2018, 10:15 AM

## 2018-09-14 NOTE — Progress Notes (Signed)
Encouraged ambulation but refused at this time, explained the significance of ambulation. Claimed to do it later.

## 2018-09-14 NOTE — Progress Notes (Signed)
Still refused to ambulate this time due to pain on the surgical site.

## 2018-09-14 NOTE — Progress Notes (Signed)
[name]       [place] [address]             [phone]      2 Days Post-Op Procedure(s) (LRB): VIDEO ASSISTED THORACOSCOPY, right lung (Right) STAPLING OF BLEBS, right lower and middle lung lobes (Right) Subjective: No issues. Pain is well controlled. Eating well.   Objective: Vital signs in last 24 hours: Temp:  [97.7 F (36.5 C)-98.8 F (37.1 C)] 98.3 F (36.8 C) (10/27 0750) Pulse Rate:  [63-69] 69 (10/27 0750) Cardiac Rhythm: Normal sinus rhythm (10/27 0400) Resp:  [16-28] 28 (10/27 0750) BP: (94-104)/(63-70) 104/68 (10/27 0750) SpO2:  [93 %-100 %] 93 % (10/27 0750) FiO2 (%):  [0 %] 0 % (10/26 1709)     Intake/Output from previous day: 10/26 0701 - 10/27 0700 In: 780 [P.O.:480; I.V.:300] Out: 1340 [Urine:1100; Chest Tube:240] Intake/Output this shift: No intake/output data recorded.  General appearance: alert, cooperative and no distress Heart: regular rate and rhythm, S1, S2 normal, no murmur, click, rub or gallop Lungs: rhonchi and chest tube rub on the left, CTA on the right  Abdomen: soft, non-tender; bowel sounds normal; no masses,  no organomegaly Extremities: extremities normal, atraumatic, no cyanosis or edema Wound: clean and dry  Lab Results: Recent Labs    09/13/18 0504 09/14/18 0249  WBC 9.7 8.9  HGB 12.7* 12.6*  HCT 38.5* 38.5*  PLT 330 307   BMET:  Recent Labs    09/13/18 0504 09/14/18 0249  NA 135 138  K 4.2 3.9  CL 100 98  CO2 27 30  GLUCOSE 94 99  BUN 7 13  CREATININE 0.55* 0.83  CALCIUM 8.2* 8.8*    PT/INR:  Recent Labs    09/11/18 1601  LABPROT 13.6  INR 1.05   ABG    Component Value Date/Time   PHART 7.427 09/13/2018 0445   HCO3 28.4 (H) 09/13/2018 0445   TCO2 26 10/13/2015 0420   ACIDBASEDEF 2.0 12/22/2010 1522   O2SAT 99.2 09/13/2018 0445   CBG (last 3)  No results for input(s): GLUCAP in the last 72 hours.  Assessment/Plan: S/P Procedure(s) (LRB): VIDEO ASSISTED  THORACOSCOPY, right lung (Right) STAPLING OF BLEBS, right lower and middle lung lobes (Right)   1. CV-NSR in the 60s, BP stable.  2. Pulm- tolerating 2L [place] with good oxygen saturation. CXR today showed: slight improved of subcutaneous emphysema, stable bilateral apical bullae. Keep to suction today 3. Renal-creatinine 0.83, electrolytes okay 4. H and H stable 5. Blood glucose well controlled.  Plan: 4+ air leak with and without cough. Keep on suction. Encouraged ambulation and use of incentive spirometer.    LOS: 10 days    [name] 09/14/2018

## 2018-09-15 ENCOUNTER — Inpatient Hospital Stay (HOSPITAL_COMMUNITY): Payer: BLUE CROSS/BLUE SHIELD

## 2018-09-15 NOTE — Progress Notes (Signed)
rec'd report from previous nurse no not was charted if notified dr Roxan Hockey of pt having crepitus up to neck, voice change,and that chest tube was reapplied to 20cm of suction. MD aware and will continue to monitor.

## 2018-09-15 NOTE — Progress Notes (Signed)
3 Days Post-Op Procedure(s) (LRB): VIDEO ASSISTED THORACOSCOPY, right lung (Right) STAPLING OF BLEBS, right lower and middle lung lobes (Right) Subjective: Some incisional pain- "not bad" Encouraged ambulation Objective: Vital signs in last 24 hours: Temp:  [97.8 F (36.6 C)-98 F (36.7 C)] 98 F (36.7 C) (10/28 0730) Pulse Rate:  [64-80] 64 (10/28 0730) Cardiac Rhythm: Normal sinus rhythm (10/28 0700) Resp:  [1-28] 13 (10/28 0730) BP: (99-116)/(70-78) 112/76 (10/28 0730) SpO2:  [19 %-100 %] 100 % (10/28 0730) FiO2 (%):  [0 %] 0 % (10/27 1701)  Hemodynamic parameters for last 24 hours:    Intake/Output from previous day: 10/27 0701 - 10/28 0700 In: 240 [P.O.:240] Out: 670 [Urine:350; Chest Tube:320] Intake/Output this shift: No intake/output data recorded.  General appearance: alert, cooperative and no distress Heart: regular rate and rhythm Lungs: clear to auscultation bilaterally + air leak  Lab Results: Recent Labs    09/13/18 0504 09/14/18 0249  WBC 9.7 8.9  HGB 12.7* 12.6*  HCT 38.5* 38.5*  PLT 330 307   BMET:  Recent Labs    09/13/18 0504 09/14/18 0249  NA 135 138  K 4.2 3.9  CL 100 98  CO2 27 30  GLUCOSE 94 99  BUN 7 13  CREATININE 0.55* 0.83  CALCIUM 8.2* 8.8*    PT/INR: No results for input(s): LABPROT, INR in the last 72 hours. ABG    Component Value Date/Time   PHART 7.427 09/13/2018 0445   HCO3 28.4 (H) 09/13/2018 0445   TCO2 26 10/13/2015 0420   ACIDBASEDEF 2.0 12/22/2010 1522   O2SAT 99.2 09/13/2018 0445   CBG (last 3)  No results for input(s): GLUCAP in the last 72 hours.  Assessment/Plan: S/P Procedure(s) (LRB): VIDEO ASSISTED THORACOSCOPY, right lung (Right) STAPLING OF BLEBS, right lower and middle lung lobes (Right) -Severe bullous emphysema- s/p stapling of blebs Still has a significant air leak Will try with Ct to water seal Ambulate SCD + enoxaparin   LOS: 11 days    Melrose Nakayama 09/15/2018

## 2018-09-15 NOTE — Progress Notes (Signed)
Physical Therapy Treatment Patient Details Name: [name] MRN: [id number] DOB: 12-15-1975 Today's Date: 09/15/2018    History of Present Illness 42 y.o.malewith medical history significant ofasthma, GERD, hypothyroidism, aspergilloma,left upper lobe segmentectomy for aspergilloma/MAI,lung bullous lung disease,who presented with shortness of breath. Dx of tension pneumothorax, chest tube placed urgently.  VATS 09/12/18.     PT Comments    Pt received in bed with c/o sore throat/swollen neck. RN notified and assessed pt prior to mobility. Pt required supervision for transfers and ambulation 110 feet pushing IV pole. Pt ambulated on 2 L O2 with sats >90% during session.    Follow Up Recommendations  Home health PT     Equipment Recommendations  None recommended by PT    Recommendations for Other Services       Precautions / Restrictions Precautions Precautions: Other (comment) Precaution Comments: monitor vitals, chest tube    Mobility  Bed Mobility Overal bed mobility: Modified Independent             General bed mobility comments: +rail, increased time  Transfers Overall transfer level: Needs assistance Equipment used: None Transfers: Sit to/from [employer] Transfers Sit to Stand: Supervision Stand pivot transfers: Supervision       General transfer comment: supervision due to multi lines  Ambulation/Gait Ambulation/Gait assistance: Supervision Gait Distance (Feet): 110 Feet Assistive device: IV Pole Gait Pattern/deviations: Step-through pattern;Decreased stride length Gait velocity: decreased Gait velocity interpretation: 1.31 - 2.62 ft/sec, indicative of limited community ambulator General Gait Details: one standing rest break after 75 feet   Stairs             Wheelchair Mobility    Modified Rankin (Stroke Patients Only)       Balance   Sitting-balance support: No upper extremity supported;Feet unsupported Sitting  balance-Leahy Scale: Normal     Standing balance support: No upper extremity supported;During functional activity Standing balance-Leahy Scale: Good                              Cognition Arousal/Alertness: Awake/alert Behavior During Therapy: WFL for tasks assessed/performed Overall Cognitive Status: Within Functional Limits for tasks assessed                                        Exercises      General Comments General comments (skin integrity, edema, etc.): VSS throughout session      Pertinent Vitals/Pain Pain Assessment: Faces Faces Pain Scale: Hurts little more Pain Location: sore throat/neck Pain Descriptors / Indicators: Sore Pain Intervention(s): Other (comment)(RN notified and assessed pt)    Home Living                      Prior Function            PT Goals (current goals can now be found in the care plan section) Acute Rehab PT Goals Patient Stated Goal: return to work at [employer] PT Goal Formulation: With patient Time For Goal Achievement: 09/22/18 Potential to Achieve Goals: Good Progress towards PT goals: Progressing toward goals    Frequency    Min 3X/week      PT Plan Current plan remains appropriate    Co-evaluation              AM-PAC PT "6 Clicks" Daily Activity  Outcome Measure  Difficulty  turning over in bed (including adjusting bedclothes, sheets and blankets)?: None Difficulty moving from lying on back to sitting on the side of the bed? : A Little Difficulty sitting down on and standing up from a chair with arms (e.g., wheelchair, bedside commode, etc,.)?: A Little Help needed moving to and from a bed to chair (including a wheelchair)?: None Help needed walking in hospital room?: A Little Help needed climbing 3-5 steps with a railing? : A Little 6 Click Score: 20    End of Session Equipment Utilized During Treatment: Gait belt;Oxygen Activity Tolerance: Patient tolerated treatment  well Patient left: in bed;with call bell/phone within reach Nurse Communication: Mobility status PT Visit Diagnosis: Difficulty in walking, not elsewhere classified (R26.2)     Time: 1624-4695 PT Time Calculation (min) (ACUTE ONLY): 25 min  Charges:  $Gait Training: 23-37 mins                     Lorrin Goodell, PT  Office # [phone] Pager [phone]    Lorriane Shire 09/15/2018, 1:13 PM

## 2018-09-15 NOTE — Progress Notes (Signed)
Patient ID: [name], male   DOB: 08/30/1976, 42 y.o.   MRN: [id number]  PROGRESS NOTE    YANG RACK  YNW:[id number] DOB: [birth year] DOA: 09/04/2018 PCP: Wallene Dales, MD   Brief Narrative:  42 year old male with history of asthma, GERD, hypothyroidism, aspergilloma, left upper lobe segmentectomy for aspergilloma/MAI, bullous lung disease presented on 09/04/2018 with shortness of breath and was found to have tension pneumothorax.  CVTS was consulted, right-sided chest tube was placed urgently.  Pneumothorax resolved with chest tube, this was removed on 09/07/2018.  Patient was found to have worsening dyspnea on 09/08/2018 and repeat chest x-ray showed recurrent tension pneumothorax which required another urgent chest tube on 09/08/2018.  He underwent  VATS on 09/12/2018 by CVTS.   Assessment & Plan:   Principal Problem:   Tension pneumothorax Active Problems:   Mycobacterium avium complex ([place])   Invasive pulmonary aspergillosis (HCC)   Severe persistent asthma   Hypothyroidism   Iron deficiency anemia   Protein-calorie malnutrition, severe   Recurrent tension pneumothorax -Chest tube placed urgently on 09/04/2018 by Dr. Roxan Hockey which was subsequently removed on 09/07/2018 ; repeat chest x-ray was stable without pneumothorax -Patient became dyspneic again on 09/08/2018 and developed another spontaneous tension pneumothorax which required chest tube placement again by Dr. Roxan Hockey -Status post VATS and stapling of blebs in the right lower and middle lobes by CVTS -Patient still has chest tube which is being managed by CVTS -Respiratory status stable  History of MAI -Continue home regimen of ethambutol and azithromycin.  Outpatient follow-up by infectious disease  History of aspergilloma -History of remote left upper lobe segmentectomy -Continue antifungal Cresemba -Outpatient follow-up with pulmonary and ID -continue prednisone 5 mg daily  History of severe  persistent asthma -Stable.  Continue bronchodilators  Hypothyroidism -Continue Synthroid  Iron deficiency anemia -Hemoglobin stable.  Continue home iron supplement  Severe malnutrition -Follow nutrition recommendations    DVT prophylaxis: Heparin Code Status: Full Family Communication: None at bedside Disposition Plan: Home once cleared by CVTS  Consultants: CVTS  Procedures: Chest tube insertion x2, VATS  Antimicrobials:   Ethambutol/Zithromax/Cresemba  Subjective: Patient seen and examined at bedside.  Has intermittent cough but no worsening shortness of breath, fever, nausea or vomiting. Objective: Vitals:   09/15/18 0409 09/15/18 0715 09/15/18 0730 09/15/18 0815  BP: 116/78  112/76   Pulse:  80 64   Resp:  (!) 28 13 (!) 21  Temp: 97.8 F (36.6 C)  98 F (36.7 C)   TempSrc: Oral  Oral   SpO2:  95% 100% 96%  Weight:      Height:        Intake/Output Summary (Last 24 hours) at 09/15/2018 1057 Last data filed at 09/15/2018 1000 Gross per 24 hour  Intake 720 ml  Output 730 ml  Net -10 ml   Filed Weights   09/04/18 1330  Weight: 51.3 kg    Examination:  General exam: Appears calm and comfortable, no acute distress Respiratory system: Bilateral decreased breath sounds at bases, no wheezing.  Right-sided chest tube present Cardiovascular system: S1 & S2 heard, Rate controlled Gastrointestinal system: Abdomen is nondistended, soft and nontender. Normal bowel sounds heard. Extremities: No cyanosis, edema   Data Reviewed: I have personally reviewed following labs and imaging studies  CBC: Recent Labs  Lab 09/08/18 1239 09/11/18 1601 09/12/18 0059 09/13/18 0504 09/14/18 0249  WBC 10.1 7.0 5.8 9.7 8.9  HGB 15.5 13.7 13.5 12.7* 12.6*  HCT 45.8 42.8 39.8 38.5* 38.5*  MCV 93.7 94.9 93.2 93.9 94.6  PLT 244 291 267 330 638   Basic Metabolic Panel: Recent Labs  Lab 09/08/18 1239 09/12/18 0059 09/13/18 0504 09/14/18 0249  NA 133* 135 135 138  K  4.8 3.9 4.2 3.9  CL 94* 98 100 98  CO2 _0 GLUCOSE 117* 84 94 99  BUN _1 CREATININE 0.76 0.67 0.55* 0.83  CALCIUM 8.7* 8.4* 8.2* 8.8*   GFR: Estimated Creatinine Clearance: 82.4 mL/min (by C-G formula based on SCr of 0.83 mg/dL). Liver Function Tests: Recent Labs  Lab 09/14/18 0249  AST 21  ALT 20  ALKPHOS 76  BILITOT 0.5  PROT 5.5*  ALBUMIN 2.3*   No results for input(s): LIPASE, AMYLASE in the last 168 hours. No results for input(s): AMMONIA in the last 168 hours. Coagulation Profile: Recent Labs  Lab 09/11/18 1601  INR 1.05   Cardiac Enzymes: No results for input(s): CKTOTAL, CKMB, CKMBINDEX, TROPONINI in the last 168 hours. BNP (last 3 results) No results for input(s): PROBNP in the last 8760 hours. HbA1C: No results for input(s): HGBA1C in the last 72 hours. CBG: No results for input(s): GLUCAP in the last 168 hours. Lipid Profile: No results for input(s): CHOL, HDL, LDLCALC, TRIG, CHOLHDL, LDLDIRECT in the last 72 hours. Thyroid Function Tests: No results for input(s): TSH, T4TOTAL, FREET4, T3FREE, THYROIDAB in the last 72 hours. Anemia Panel: No results for input(s): VITAMINB12, FOLATE, FERRITIN, TIBC, IRON, RETICCTPCT in the last 72 hours. Sepsis Labs: No results for input(s): PROCALCITON, LATICACIDVEN in the last 168 hours.  No results found for this or any previous visit (from the past 240 hour(s)).       Radiology Studies: Dg Chest Port 1 View  Result Date: 09/15/2018 CLINICAL DATA:  Chest 2 EXAM: PORTABLE CHEST 1 VIEW COMPARISON:  09/14/2018 FINDINGS: Normal heart size. Stable right chest tube. Bilateral upper lobe large bullae are stable. No definite pneumothorax. Right chest tube is stable in position. There is scarring throughout both central and mid lungs. Emphysema throughout the soft tissues of the chest and axilla bilaterally is stable. IMPRESSION: Stable right chest tube without pneumothorax. Electronically Signed   By:  Marybelle Killings M.D.   On: 09/15/2018 08:49   Dg Chest Port 1 View  Result Date: 09/14/2018 CLINICAL DATA:  Followup right chest tube. EXAM: PORTABLE CHEST 1 VIEW COMPARISON:  09/13/2018. FINDINGS: Normal sized heart. Stable large biapical bullae and bilateral surgical staples. A right chest tube remains in place with no interval pneumothorax seen. There is some increase in subcutaneous emphysema on the right and interval subcutaneous emphysema superiorly on the left. Unremarkable bones. IMPRESSION: 1. Increased subcutaneous emphysema on the right and interval subcutaneous emphysema superiorly on the left. 2. Stable large biapical bullae and surgical staples. 3. No pneumothorax seen. Electronically Signed   By: Claudie Revering M.D.   On: 09/14/2018 10:03        Scheduled Meds: . acetaminophen  1,000 mg Oral Q6H   Or  . acetaminophen (TYLENOL) oral liquid 160 mg/5 mL  1,000 mg Oral Q6H  . azithromycin  500 mg Oral Daily  . bisacodyl  10 mg Oral Daily  . enoxaparin (LOVENOX) injection  40 mg Subcutaneous Daily  . ethambutol  1,000 mg Oral Daily  . feeding supplement (ENSURE ENLIVE)  237 mL Per Tube TID BM  . fluticasone furoate-vilanterol  1 puff Inhalation Daily  . HYDROmorphone   Intravenous Q4H  . Isavuconazonium Sulfate  372 mg Oral Daily  . levothyroxine  75 mcg Oral QAC breakfast  . predniSONE  5 mg Oral Q breakfast  . senna-docusate  1 tablet Oral QHS  . umeclidinium bromide  1 puff Inhalation Daily   Continuous Infusions: . potassium chloride       LOS: 11 days        Aline August, MD [facility] Pager [phone]  If 7PM-7AM, please contact night-coverage [email or web address] Password [facility] 09/15/2018, 10:57 AM

## 2018-09-15 NOTE — Progress Notes (Signed)
Nutrition Follow-up  DOCUMENTATION CODES:   Severe malnutrition in context of chronic illness, Underweight  INTERVENTION:   Continue:   Ensure Enlive po TID, each supplement provides 350 kcal and 20 grams of protein  NUTRITION DIAGNOSIS:   Severe Malnutrition related to chronic illness(bullous lung disease ) as evidenced by severe fat depletion, severe muscle depletion Ongoing.   GOAL:   Patient will meet greater than or equal to 90% of their needs Progressing.   MONITOR:   PO intake, Supplement acceptance, Labs, Skin, Weight trends, I & O's  ASSESSMENT:   42 yo Male with PMH of MAI, bullous lung disease, left upper lobe segmentectomy for aspergilloma, reflux and hypothyroidism. He presented to [facility] today after noting onset of shortness of breath.   Chest tube remains in place with 320 ml out x 24 hr No new weight since admission Per pt he is consuming > 50% of his meals, 100% at some including this morning. He also reports drinking 3 ensures per day which RN confirmed.   Labs & medications reviewed. Dulcolax daily, senokot-s daily  Diet Order:   Diet Order            Diet regular Room service appropriate? Yes; Fluid consistency: Thin  Diet effective now             EDUCATION NEEDS:   No education needs have been identified at this time  Skin:  Skin Assessment: Reviewed RN Assessment  Last BM:  10/27  Height:   Ht Readings from Last 1 Encounters:  09/04/18 _0  (1.702 m)   Weight:   Wt Readings from Last 1 Encounters:  09/04/18 51.3 kg   Wt Readings from Last 10 Encounters:  09/04/18 51.3 kg  08/11/18 52.3 kg  06/17/18 58.5 kg  01/27/18 54.9 kg  12/16/17 56.7 kg  10/22/17 56.4 kg  09/16/17 56.2 kg  08/26/17 56.8 kg  08/09/17 55.5 kg  06/25/17 59 kg   BMI:  Body mass index is 17.7 kg/m.  Estimated Nutritional Needs:   Kcal:  1700-1900  Protein:  80-95 gm  Fluid:  1.7-1.9 L  Maylon Peppers RD, LDN, CNSC [phone] Pager [phone]  After Hours Pager

## 2018-09-16 ENCOUNTER — Inpatient Hospital Stay (HOSPITAL_COMMUNITY): Payer: BLUE CROSS/BLUE SHIELD

## 2018-09-16 DIAGNOSIS — T797XXA Traumatic subcutaneous emphysema, initial encounter: Secondary | ICD-10-CM

## 2018-09-16 NOTE — Progress Notes (Signed)
Patient ID: [name], male   DOB: Mar 22, 1976, 42 y.o.   MRN: [id number]  PROGRESS NOTE    [name]  NOI:[id number] DOB: 1976-02-09 DOA: 09/04/2018 PCP: Wallene Dales, MD   Brief Narrative:  42 year old male with history of asthma, GERD, hypothyroidism, aspergilloma, left upper lobe segmentectomy for aspergilloma/MAI, bullous lung disease presented on 09/04/2018 with shortness of breath and was found to have tension pneumothorax.  CVTS was consulted, right-sided chest tube was placed urgently.  Pneumothorax resolved with chest tube, this was removed on 09/07/2018.  Patient was found to have worsening dyspnea on 09/08/2018 and repeat chest x-ray showed recurrent tension pneumothorax which required another urgent chest tube on 09/08/2018.  He underwent  VATS on 09/12/2018 by CVTS.   Assessment & Plan:   Principal Problem:   Tension pneumothorax Active Problems:   Mycobacterium avium complex ([place])   Invasive pulmonary aspergillosis (HCC)   Severe persistent asthma   Hypothyroidism   Iron deficiency anemia   Protein-calorie malnutrition, severe   Recurrent tension pneumothorax -Chest tube placed urgently on 09/04/2018 by Dr. Roxan Hockey which was subsequently removed on 09/07/2018 ; repeat chest x-ray was stable without pneumothorax -Patient became dyspneic again on 09/08/2018 and developed another spontaneous tension pneumothorax which required chest tube placement again by Dr. Roxan Hockey -Status post VATS and stapling of blebs in the right lower and middle lobes by CVTS -Patient still has chest tube which is being managed by CVTS.  Patient developed subcutaneous emphysema on 09/07/2018 after chest tube was put to waterseal, now chest tube is back on suction.  Chest x-ray shows possible small basilar pneumothorax -Respiratory status stable  History of MAI -Continue home regimen of ethambutol and azithromycin.  Outpatient follow-up by infectious disease  History of  aspergilloma -History of remote left upper lobe segmentectomy -Continue antifungal Cresemba -Outpatient follow-up with pulmonary and ID -continue prednisone 5 mg daily  History of severe persistent asthma -Stable.  Continue bronchodilators  Hypothyroidism -Continue Synthroid  Iron deficiency anemia -Hemoglobin stable.  Continue home iron supplement  Severe malnutrition -Follow nutrition recommendations    DVT prophylaxis: Heparin Code Status: Full Family Communication: None at bedside Disposition Plan: Home once cleared by CVTS  Consultants: CVTS  Procedures: Chest tube insertion x2, VATS  Antimicrobials:   Ethambutol/Zithromax/Cresemba  Subjective: Patient seen and examined at bedside.  Patient denies any worsening shortness of breath, cough, fever, nausea or vomiting.  No chest pains.   Objective: Vitals:   09/16/18 0041 09/16/18 0044 09/16/18 0414 09/16/18 0747  BP:  125/89  106/74  Pulse:  82  71  Resp: 16 (!) _0 Temp:  97.8 F (36.6 C)  97.9 F (36.6 C)  TempSrc:  Oral  Oral  SpO2: 97% 100% 100% 96%  Weight:      Height:        Intake/Output Summary (Last 24 hours) at 09/16/2018 1055 Last data filed at 09/16/2018 1000 Gross per 24 hour  Intake 600 ml  Output 945 ml  Net -345 ml   Filed Weights   09/04/18 1330  Weight: 51.3 kg    Examination:  General exam: Appears calm and comfortable, no acute distress.  Face appears swollen. Respiratory system: Bilateral decreased breath sounds at bases. Right-sided chest tube present.  Subcutaneous emphysema present Cardiovascular system: S1 & S2 heard, Rate controlled Gastrointestinal system: Abdomen is nondistended, soft and nontender. Normal bowel sounds heard. Extremities: No cyanosis, edema   Data Reviewed: I have personally reviewed following labs and imaging  studies  CBC: Recent Labs  Lab 09/11/18 1601 09/12/18 0059 09/13/18 0504 09/14/18 0249  WBC 7.0 5.8 9.7 8.9  HGB 13.7 13.5  12.7* 12.6*  HCT 42.8 39.8 38.5* 38.5*  MCV 94.9 93.2 93.9 94.6  PLT 291 267 330 115   Basic Metabolic Panel: Recent Labs  Lab 09/12/18 0059 09/13/18 0504 09/14/18 0249  NA 135 135 138  K 3.9 4.2 3.9  CL 98 100 98  CO2 _0 GLUCOSE 84 94 99  BUN _1 CREATININE 0.67 0.55* 0.83  CALCIUM 8.4* 8.2* 8.8*   GFR: Estimated Creatinine Clearance: 82.4 mL/min (by C-G formula based on SCr of 0.83 mg/dL). Liver Function Tests: Recent Labs  Lab 09/14/18 0249  AST 21  ALT 20  ALKPHOS 76  BILITOT 0.5  PROT 5.5*  ALBUMIN 2.3*   No results for input(s): LIPASE, AMYLASE in the last 168 hours. No results for input(s): AMMONIA in the last 168 hours. Coagulation Profile: Recent Labs  Lab 09/11/18 1601  INR 1.05   Cardiac Enzymes: No results for input(s): CKTOTAL, CKMB, CKMBINDEX, TROPONINI in the last 168 hours. BNP (last 3 results) No results for input(s): PROBNP in the last 8760 hours. HbA1C: No results for input(s): HGBA1C in the last 72 hours. CBG: No results for input(s): GLUCAP in the last 168 hours. Lipid Profile: No results for input(s): CHOL, HDL, LDLCALC, TRIG, CHOLHDL, LDLDIRECT in the last 72 hours. Thyroid Function Tests: No results for input(s): TSH, T4TOTAL, FREET4, T3FREE, THYROIDAB in the last 72 hours. Anemia Panel: No results for input(s): VITAMINB12, FOLATE, FERRITIN, TIBC, IRON, RETICCTPCT in the last 72 hours. Sepsis Labs: No results for input(s): PROCALCITON, LATICACIDVEN in the last 168 hours.  No results found for this or any previous visit (from the past 240 hour(s)).       Radiology Studies: Dg Chest Port 1 View  Result Date: 09/16/2018 CLINICAL DATA:  Spontaneous pneumothorax.  Chest tube EXAM: PORTABLE CHEST 1 VIEW COMPARISON:  09/15/2018 FINDINGS: Right chest tube remains in place. Small right basilar pneumothorax. Apical bullous emphysema bilaterally. Extensive subcutaneous emphysema on the right is unchanged. Progression of  subcutaneous emphysema in the left chest. Surgical staples in the left lung base. IMPRESSION: Right chest tube in place with small right basilar pneumothorax. Progression of subcutaneous emphysema on the left. Electronically Signed   By: Franchot Gallo M.D.   On: 09/16/2018 09:35   Dg Chest Port 1 View  Result Date: 09/15/2018 CLINICAL DATA:  Chest 2 EXAM: PORTABLE CHEST 1 VIEW COMPARISON:  09/14/2018 FINDINGS: Normal heart size. Stable right chest tube. Bilateral upper lobe large bullae are stable. No definite pneumothorax. Right chest tube is stable in position. There is scarring throughout both central and mid lungs. Emphysema throughout the soft tissues of the chest and axilla bilaterally is stable. IMPRESSION: Stable right chest tube without pneumothorax. Electronically Signed   By: Marybelle Killings M.D.   On: 09/15/2018 08:49        Scheduled Meds: . acetaminophen  1,000 mg Oral Q6H   Or  . acetaminophen (TYLENOL) oral liquid 160 mg/5 mL  1,000 mg Oral Q6H  . azithromycin  500 mg Oral Daily  . bisacodyl  10 mg Oral Daily  . enoxaparin (LOVENOX) injection  40 mg Subcutaneous Daily  . ethambutol  1,000 mg Oral Daily  . feeding supplement (ENSURE ENLIVE)  237 mL Per Tube TID BM  . fluticasone furoate-vilanterol  1 puff Inhalation Daily  . HYDROmorphone  Intravenous Q4H  . Isavuconazonium Sulfate  372 mg Oral Daily  . levothyroxine  75 mcg Oral QAC breakfast  . predniSONE  5 mg Oral Q breakfast  . senna-docusate  1 tablet Oral QHS  . umeclidinium bromide  1 puff Inhalation Daily   Continuous Infusions: . potassium chloride       LOS: 12 days        Aline August, MD [facility] Pager [phone]  If 7PM-7AM, please contact night-coverage [email or web address] Password [email or web address] 09/16/2018, 10:55 AM

## 2018-09-16 NOTE — Progress Notes (Signed)
4 Days Post-Op Procedure(s) (LRB): VIDEO ASSISTED THORACOSCOPY, right lung (Right) STAPLING OF BLEBS, right lower and middle lung lobes (Right) Subjective: Feels a little better this AM  Objective: Vital signs in last 24 hours: Temp:  [97.8 F (36.6 C)-98.4 F (36.9 C)] 97.8 F (36.6 C) (10/29 0044) Pulse Rate:  [66-91] 82 (10/29 0044) Cardiac Rhythm: Normal sinus rhythm (10/29 0447) Resp:  [15-24] 20 (10/29 0414) BP: (103-125)/(71-89) 125/89 (10/29 0044) SpO2:  [93 %-100 %] 100 % (10/29 0414)  Hemodynamic parameters for last 24 hours:    Intake/Output from previous day: 10/28 0701 - 10/29 0700 In: 1080 [P.O.:1080] Out: 860 [Urine:500; Chest Tube:360] Intake/Output this shift: No intake/output data recorded.  General appearance: alert, cooperative and no distress Neurologic: intact Heart: regular rate and rhythm Lungs: air leak sounds on right, SQ emphysema + air leak  Lab Results: Recent Labs    09/14/18 0249  WBC 8.9  HGB 12.6*  HCT 38.5*  PLT 307   BMET:  Recent Labs    09/14/18 0249  NA 138  K 3.9  CL 98  CO2 30  GLUCOSE 99  BUN 13  CREATININE 0.83  CALCIUM 8.8*    PT/INR: No results for input(s): LABPROT, INR in the last 72 hours. ABG    Component Value Date/Time   PHART 7.427 09/13/2018 0445   HCO3 28.4 (H) 09/13/2018 0445   TCO2 26 10/13/2015 0420   ACIDBASEDEF 2.0 12/22/2010 1522   O2SAT 99.2 09/13/2018 0445   CBG (last 3)  No results for input(s): GLUCAP in the last 72 hours.  Assessment/Plan: S/P Procedure(s) (LRB): VIDEO ASSISTED THORACOSCOPY, right lung (Right) STAPLING OF BLEBS, right lower and middle lung lobes (Right) -   Increased SQ emphysema with tube to water seal, now back on suction. CXR shows possible small basilar pneumothorax May need to consider intrabronchial valve placement   LOS: 12 days    [name] 09/16/2018

## 2018-09-16 NOTE — Telephone Encounter (Signed)
Pt is still in the hospital. He had surgery on his right lung 09/12/18.

## 2018-09-16 NOTE — Plan of Care (Signed)
  Problem: Education: Goal: Knowledge of General Education information will improve Description Including pain rating scale, medication(s)/side effects and non-pharmacologic comfort measures Outcome: Progressing   Problem: Clinical Measurements: Goal: Ability to maintain clinical measurements within normal limits will improve Outcome: Progressing Goal: Will remain free from infection Outcome: Progressing Goal: Diagnostic test results will improve Outcome: Progressing Goal: Respiratory complications will improve Outcome: Progressing Goal: Cardiovascular complication will be avoided Outcome: Progressing   Problem: Activity: Goal: Risk for activity intolerance will decrease Outcome: Progressing   Problem: Coping: Goal: Level of anxiety will decrease Outcome: Progressing   Problem: Elimination: Goal: Will not experience complications related to urinary retention Outcome: Progressing   Problem: Pain Managment: Goal: General experience of comfort will improve Outcome: Progressing   Problem: Safety: Goal: Ability to remain free from injury will improve Outcome: Progressing   Problem: Skin Integrity: Goal: Risk for impaired skin integrity will decrease Outcome: Progressing

## 2018-09-16 NOTE — Progress Notes (Signed)
[place] drainage system replaced, chest tube with large air leak on suction. MD aware.

## 2018-09-17 ENCOUNTER — Inpatient Hospital Stay (HOSPITAL_COMMUNITY): Payer: BLUE CROSS/BLUE SHIELD

## 2018-09-17 DIAGNOSIS — Z9689 Presence of other specified functional implants: Secondary | ICD-10-CM

## 2018-09-17 LAB — BASIC METABOLIC PANEL
ANION GAP: 9 (ref 5–15)
BUN: 13 mg/dL (ref 6–20)
CALCIUM: 8.6 mg/dL — AB (ref 8.9–10.3)
CO2: 29 mmol/L (ref 22–32)
Chloride: 100 mmol/L (ref 98–111)
Creatinine, Ser: 0.66 mg/dL (ref 0.61–1.24)
GFR calc non Af Amer: >60 mL/min (ref >60)
Glucose, Bld: 102 mg/dL — ABNORMAL HIGH (ref 70–99)
Potassium: 3.9 mmol/L (ref 3.5–5.1)
Sodium: 138 mmol/L (ref 135–145)

## 2018-09-17 LAB — CBC WITH DIFFERENTIAL/PLATELET
Abs Immature Granulocytes: 0.04 10*3/uL (ref 0.00–0.07)
BASOS ABS: 0 10*3/uL (ref 0.0–0.1)
Basophils Relative: 0 %
EOS ABS: 0.1 10*3/uL (ref 0.0–0.5)
Eosinophils Relative: 1 %
HCT: 41.7 % (ref 39.0–52.0)
HEMOGLOBIN: 14 g/dL (ref 13.0–17.0)
IMMATURE GRANULOCYTES: 1 %
LYMPHS ABS: 1.1 10*3/uL (ref 0.7–4.0)
LYMPHS PCT: 13 %
MCH: 31.3 pg (ref 26.0–34.0)
MCHC: 33.6 g/dL (ref 30.0–36.0)
MCV: 93.3 fL (ref 80.0–100.0)
Monocytes Absolute: 0.3 10*3/uL (ref 0.1–1.0)
Monocytes Relative: 3 %
NEUTROS ABS: 7 10*3/uL (ref 1.7–7.7)
NEUTROS PCT: 82 %
NRBC: 0 % (ref 0.0–0.2)
Platelets: 372 10*3/uL (ref 150–400)
RBC: 4.47 MIL/uL (ref 4.22–5.81)
RDW: 12.9 % (ref 11.5–15.5)
WBC: 8.5 10*3/uL (ref 4.0–10.5)

## 2018-09-17 LAB — MAGNESIUM: Magnesium: 2.1 mg/dL (ref 1.7–2.4)

## 2018-09-17 NOTE — Progress Notes (Addendum)
[address]       [place] [address]             [phone]       5 Days Post-Op Procedure(s) (LRB): VIDEO ASSISTED THORACOSCOPY, right lung (Right) STAPLING OF BLEBS, right lower and middle lung lobes (Right)  Subjective: Patient without specific complaints this am.  Objective: Vital signs in last 24 hours: Temp:  [97.9 F (36.6 C)-98.3 F (36.8 C)] 98 F (36.7 C) (10/30 0426) Pulse Rate:  [71-92] 87 (10/30 0426) Cardiac Rhythm: Normal sinus rhythm (10/30 0016) Resp:  [11-19] 19 (10/30 0426) BP: (103-122)/(70-84) 122/83 (10/30 0426) SpO2:  [95 %-99 %] 95 % (10/30 0426)     Intake/Output from previous day: 10/29 0701 - 10/30 0700 In: 1000 [P.O.:1000] Out: 1924 [Urine:1700; Chest Tube:224]   Physical Exam:  Cardiovascular: RRR Pulmonary: Coarse bilaterally Abdomen: Soft, non tender, bowel sounds present. Extremities: No  lower extremity edema. Wounds: Clean and dry.  No erythema or signs of infection. Chest Tube: to suction, +++ air leak that worsens with cough  Lab Results: CBC: Recent Labs    09/17/18 0238  WBC 8.5  HGB 14.0  HCT 41.7  PLT 372   BMET:  Recent Labs    09/17/18 0238  NA 138  K 3.9  CL 100  CO2 29  GLUCOSE 102*  BUN 13  CREATININE 0.66  CALCIUM 8.6*    PT/INR: No results for input(s): LABPROT, INR in the last 72 hours. ABG:  INR: Will add last result for INR, ABG once components are confirmed Will add last 4 CBG results once components are confirmed  Assessment/Plan:  1. CV - SR 2.  Pulmonary - Chest tube with 224 cc output last 24 hours. Chest tube to suction. There is an air leak that worsens with cough. CXR this am appears stable (bilateral extensive subcutaneous emphysema and apical bullous emphysema bilaterally). Continue Prednisone and Ellipta. Encourage incentive spirometer. Dr. Roxan Hockey to decide if would need intrabronchial valve placement.   Donielle M ZimmermanPA-C 09/17/2018,7:30  AM [phone]  Patient seen and examined, agree with above He still has a pretty large air leak I recommended we try IBV placement to decrease the air leak. I informed him of the general nature of the procedure, the indications, risks, benefits and alternatives. He is agreeable to proceed. Plan to proceed on Friday  Doyt Castellana C. Roxan Hockey, MD [facility] [phone]

## 2018-09-17 NOTE — Progress Notes (Signed)
Physical Therapy Treatment Patient Details Name: [name] MRN: [id number] DOB: 12/05/1975 Today's Date: 09/17/2018    History of Present Illness 42 y.o.malewith medical history significant ofasthma, GERD, hypothyroidism, aspergilloma,left upper lobe segmentectomy for aspergilloma/MAI,lung bullous lung disease,who presented with shortness of breath. Dx of tension pneumothorax, chest tube placed urgently.  VATS 09/12/18.     PT Comments    Patient is progressing very well towards their physical therapy goals, as evidenced by increased ambulation distance to 335 ft with supervision and light use of IV pole. Remains very motivated and eager to participate in therapy. Will continue to progress mobility and balance as tolerated.    Follow Up Recommendations  Home health PT     Equipment Recommendations  None recommended by PT    Recommendations for Other Services       Precautions / Restrictions Precautions Precautions: Other (comment) Precaution Comments: chest tube Restrictions Weight Bearing Restrictions: No    Mobility  Bed Mobility Overal bed mobility: Modified Independent                Transfers Overall transfer level: Needs assistance Equipment used: None Transfers: Sit to/from Stand Sit to Stand: Supervision            Ambulation/Gait Ambulation/Gait assistance: Supervision Gait Distance (Feet): 335 Feet Assistive device: IV Pole Gait Pattern/deviations: Step-through pattern;Decreased stride length     General Gait Details: Patient with good posture and continuous gait pattern. Lightly utilizing IV pole for balance. HR 90-102 bpm   Stairs             Wheelchair Mobility    Modified Rankin (Stroke Patients Only)       Balance Overall balance assessment: Mild deficits observed, not formally tested                                          Cognition Arousal/Alertness: Awake/alert Behavior During Therapy: WFL  for tasks assessed/performed Overall Cognitive Status: Within Functional Limits for tasks assessed                                        Exercises      General Comments        Pertinent Vitals/Pain Pain Assessment: Faces Faces Pain Scale: Hurts little more Pain Location: generalized Pain Descriptors / Indicators: Sore Pain Intervention(s): Monitored during session    Home Living                      Prior Function            PT Goals (current goals can now be found in the care plan section) Acute Rehab PT Goals Patient Stated Goal: return to work at [employer] to Achieve Goals: Good Progress towards PT goals: Progressing toward goals    Frequency    Min 3X/week      PT Plan Current plan remains appropriate    Co-evaluation              AM-PAC PT "6 Clicks" Daily Activity  Outcome Measure  Difficulty turning over in bed (including adjusting bedclothes, sheets and blankets)?: None Difficulty moving from lying on back to sitting on the side of the bed? : None Difficulty sitting down on and standing up from a chair with  arms (e.g., wheelchair, bedside commode, etc,.)?: None Help needed moving to and from a bed to chair (including a wheelchair)?: A Little Help needed walking in hospital room?: A Little Help needed climbing 3-5 steps with a railing? : A Little 6 Click Score: 21    End of Session   Activity Tolerance: Patient tolerated treatment well Patient left: in bed;with call bell/phone within reach Nurse Communication: Mobility status PT Visit Diagnosis: Difficulty in walking, not elsewhere classified (R26.2)     Time: 7471-8550 PT Time Calculation (min) (ACUTE ONLY): 15 min  Charges:  $Therapeutic Activity: 8-22 mins                     Ellamae Sia, PT, DPT [place] Pager [phone] Office [phone]    Willy Eddy 09/17/2018, 5:07 PM

## 2018-09-17 NOTE — Progress Notes (Signed)
Patient ID: [name], male   DOB: 1976-01-02, 42 y.o.   MRN: [id number]  PROGRESS NOTE    [name]  KWI:[id number] DOB: 1975/12/11 DOA: 09/04/2018 PCP: Brett Dales, MD   Brief Narrative:  42 year old male with history of asthma, GERD, hypothyroidism, aspergilloma, left upper lobe segmentectomy for aspergilloma/MAI, bullous lung disease presented on 09/04/2018 with shortness of breath and was found to have tension pneumothorax.  CVTS was consulted, right-sided chest tube was placed urgently.  Pneumothorax resolved with chest tube, this was removed on 09/07/2018.  Patient was found to have worsening dyspnea on 09/08/2018 and repeat chest x-ray showed recurrent tension pneumothorax which required another urgent chest tube on 09/08/2018.  He underwent  VATS on 09/12/2018 by CVTS.   Assessment & Plan:   Recurrent tension pneumothorax -Chest tube placed urgently on 09/04/2018 by Dr. Roxan Farmer which was subsequently removed on 09/07/2018 ; repeat chest x-ray was stable without pneumothorax -Patient became dyspneic again on 09/08/2018 and developed another spontaneous tension pneumothorax which required chest tube placement again by Dr. Roxan Farmer -Underwent VATS, bleb resection, pleural abrasion on 10/25 -Now with subcutaneous emphysema -Chest x-ray shows small right basilar pneumothorax -Per CVTS  History of MAI -Continue home regimen of ethambutol and azithromycin.  Outpatient follow-up by infectious disease  History of aspergilloma -History of remote left upper lobe segmentectomy -Continue antifungal Cresemba -Outpatient follow-up with pulmonary and ID -continue prednisone 5 mg daily  History of severe persistent asthma -Stable.  Continue bronchodilators  Hypothyroidism -Continue Synthroid  Iron deficiency anemia -Hemoglobin stable.  Continue home iron supplement  Severe malnutrition -Follow nutrition recommendations  DVT prophylaxis: Heparin Code Status: Full Family  Communication: None at bedside Disposition Plan: Home once cleared by CVTS  Consultants: CVTS  Procedures: Chest tube insertion x2, VATS  Antimicrobials:   Ethambutol/Zithromax/Cresemba  Subjective: -Improving, denies any chest pain or dyspnea Objective: Vitals:   09/17/18 0426 09/17/18 0735 09/17/18 0800 09/17/18 0831  BP: 122/83   103/71  Pulse: 87     Resp: 19  19   Temp: 98 F (36.7 C)   98.3 F (36.8 C)  TempSrc: Oral   Oral  SpO2: 95% 98% 100%   Weight:      Height:        Intake/Output Summary (Last 24 hours) at 09/17/2018 1151 Last data filed at 09/17/2018 0500 Gross per 24 hour  Intake 600 ml  Output 1478 ml  Net -878 ml   Filed Weights   09/04/18 1330  Weight: 51.3 kg    Examination:  Gen: Awake, Alert, Oriented X 3, thin cachectic HEENT: Neck swelling, subcutaneous emphysema palpated in the neck and right side of the face Lungs: poor Air movement CVS: S1S2/RRR Abd: soft, Non tender, non distended, BS present Extremities: No Cyanosis, Clubbing or edema Skin: no new rashes  Data Reviewed: I have personally reviewed following labs and imaging studies  CBC: Recent Labs  Lab 09/11/18 1601 09/12/18 0059 09/13/18 0504 09/14/18 0249 09/17/18 0238  WBC 7.0 5.8 9.7 8.9 8.5  NEUTROABS  --   --   --   --  7.0  HGB 13.7 13.5 12.7* 12.6* 14.0  HCT 42.8 39.8 38.5* 38.5* 41.7  MCV 94.9 93.2 93.9 94.6 93.3  PLT 291 267 330 307 242   Basic Metabolic Panel: Recent Labs  Lab 09/12/18 0059 09/13/18 0504 09/14/18 0249 09/17/18 0238  NA 135 135 138 138  K 3.9 4.2 3.9 3.9  CL 98 100 98 100  CO2 27 27  30 29  GLUCOSE 84 94 99 102*  BUN _0 CREATININE 0.67 0.55* 0.83 0.66  CALCIUM 8.4* 8.2* 8.8* 8.6*  MG  --   --   --  2.1   GFR: Estimated Creatinine Clearance: 85.5 mL/min (by C-G formula based on SCr of 0.66 mg/dL). Liver Function Tests: Recent Labs  Lab 09/14/18 0249  AST 21  ALT 20  ALKPHOS 76  BILITOT 0.5  PROT 5.5*  ALBUMIN  2.3*   No results for input(s): LIPASE, AMYLASE in the last 168 hours. No results for input(s): AMMONIA in the last 168 hours. Coagulation Profile: Recent Labs  Lab 09/11/18 1601  INR 1.05   Cardiac Enzymes: No results for input(s): CKTOTAL, CKMB, CKMBINDEX, TROPONINI in the last 168 hours. BNP (last 3 results) No results for input(s): PROBNP in the last 8760 hours. HbA1C: No results for input(s): HGBA1C in the last 72 hours. CBG: No results for input(s): GLUCAP in the last 168 hours. Lipid Profile: No results for input(s): CHOL, HDL, LDLCALC, TRIG, CHOLHDL, LDLDIRECT in the last 72 hours. Thyroid Function Tests: No results for input(s): TSH, T4TOTAL, FREET4, T3FREE, THYROIDAB in the last 72 hours. Anemia Panel: No results for input(s): VITAMINB12, FOLATE, FERRITIN, TIBC, IRON, RETICCTPCT in the last 72 hours. Sepsis Labs: No results for input(s): PROCALCITON, LATICACIDVEN in the last 168 hours.  No results found for this or any previous visit (from the past 240 hour(s)).       Radiology Studies: Dg Chest Port 1 View  Result Date: 09/17/2018 CLINICAL DATA:  Followup right chest tube for a spontaneous pneumothorax. EXAM: PORTABLE CHEST 1 VIEW COMPARISON:  Yesterday. FINDINGS: Stable right chest tube. Mild increase in size of a small right basilar pneumothorax, currently occupying approximately 5% of the volume of the right hemithorax. Stable biapical bullous changes and bilateral surgical staples. Mildly decreased bilateral subcutaneous emphysema. Normal sized heart. Unremarkable bones. IMPRESSION: 1. Mild increase in size of a small right basilar pneumothorax, currently occupying approximately 5% of the volume of the right hemithorax. 2. Stable changes of COPD. Electronically Signed   By: [name] M.D.   On: 09/17/2018 10:47   Dg Chest Port 1 View  Result Date: 09/16/2018 CLINICAL DATA:  Spontaneous pneumothorax.  Chest tube EXAM: PORTABLE CHEST 1 VIEW COMPARISON:   09/15/2018 FINDINGS: Right chest tube remains in place. Small right basilar pneumothorax. Apical bullous emphysema bilaterally. Extensive subcutaneous emphysema on the right is unchanged. Progression of subcutaneous emphysema in the left chest. Surgical staples in the left lung base. IMPRESSION: Right chest tube in place with small right basilar pneumothorax. Progression of subcutaneous emphysema on the left. Electronically Signed   By: Franchot Gallo M.D.   On: 09/16/2018 09:35        Scheduled Meds: . acetaminophen  1,000 mg Oral Q6H   Or  . acetaminophen (TYLENOL) oral liquid 160 mg/5 mL  1,000 mg Oral Q6H  . azithromycin  500 mg Oral Daily  . bisacodyl  10 mg Oral Daily  . enoxaparin (LOVENOX) injection  40 mg Subcutaneous Daily  . ethambutol  1,000 mg Oral Daily  . feeding supplement (ENSURE ENLIVE)  237 mL Per Tube TID BM  . fluticasone furoate-vilanterol  1 puff Inhalation Daily  . HYDROmorphone   Intravenous Q4H  . Isavuconazonium Sulfate  372 mg Oral Daily  . levothyroxine  75 mcg Oral QAC breakfast  . predniSONE  5 mg Oral Q breakfast  . senna-docusate  1 tablet Oral QHS  .  umeclidinium bromide  1 puff Inhalation Daily   Continuous Infusions: . potassium chloride       LOS: 13 days        Brett Polite, MD [facility] Page via [email or web address]  If 7PM-7AM, please contact night-coverage [email or web address] Password [email or web address] 09/17/2018, 11:51 AM

## 2018-09-18 ENCOUNTER — Inpatient Hospital Stay (HOSPITAL_COMMUNITY): Payer: BLUE CROSS/BLUE SHIELD

## 2018-09-18 NOTE — Progress Notes (Signed)
PT Cancellation Note  Patient Details Name: [name] MRN: [id number] DOB: [birth year]   Cancelled Treatment:    Reason Eval/Treat Not Completed: Other (comment). Pt currently ambulating around the unit with nursing. Has amb x 3 today.   Shary Decamp [facility] 09/18/2018, 3:35 PM [place] Pager [phone] Office [phone]

## 2018-09-18 NOTE — Discharge Instructions (Signed)
Thoracoscopy, Care After Refer to this sheet in the next few weeks. These instructions provide you with information about caring for yourself after your procedure. Your health care provider may also give you more specific instructions. Your treatment has been planned according to current medical practices, but problems sometimes occur. Call your health care provider if you have any problems or questions after your procedure. What can I expect after the procedure? After your procedure, it is common to feel sore for up to two weeks. Follow these instructions at home:  There are many different ways to close and cover an incision, including stitches (sutures), skin glue, and adhesive strips. Follow your health care provider's instructions about: ? Incision care. ? Bandage (dressing) changes and removal. ? Incision closure removal.  Check your incision area every day for signs of infection. Watch for: ? Redness, swelling, or pain. ? Fluid, blood, or pus.  Take medicines only as directed by your health care provider.  Try to cough often. Coughing helps to protect against lung infection (pneumonia). It may hurt to cough. If this happens, hold a pillow against your chest when you cough.  Take deep breaths. This also helps to protect against pneumonia.  If you were given an incentive spirometer, use it as directed by your health care provider.  Do not take baths, swim, or use a hot tub until your health care provider approves. You may take showers.  Avoid lifting until your health care provider approves.  Avoid driving until your health care provider approves.  Do not travel by airplane after the chest tube is removed until your health care provider approves. Contact a health care provider if:  You have a fever.  Pain medicines do not ease your pain.  You have redness, swelling, or increasing pain in your incision area.  You develop a cough that does not go away, or you are coughing up  mucus that is yellow or green. Get help right away if:  You have fluid, blood, or pus coming from your incision.  There is a bad smell coming from your incision or dressing.  You develop a rash.  You have difficulty breathing.  You cough up blood.  You develop light-headedness or you feel faint.  You develop chest pain.  Your heartbeat feels irregular or very fast. This information is not intended to replace advice given to you by your health care provider. Make sure you discuss any questions you have with your health care provider. Document Released: 05/25/2005 Document Revised: 07/08/2016 Document Reviewed: 07/21/2014 Elsevier Interactive Patient Education  2018 [employer].

## 2018-09-18 NOTE — H&P (View-Only) (Signed)
[address]       ,[place] [id number]             [phone]       6 Days Post-Op Procedure(s) (LRB): VIDEO ASSISTED THORACOSCOPY, right lung (Right) STAPLING OF BLEBS, right lower and middle lung lobes (Right)  Subjective: Patient without specific complaints this am.  Objective: Vital signs in last 24 hours: Temp:  [97.8 F (36.6 C)-98.3 F (36.8 C)] 98 F (36.7 C) (10/31 0359) Pulse Rate:  [66-68] 68 (10/31 0359) Cardiac Rhythm: Normal sinus rhythm (10/31 0700) Resp:  [13-19] 13 (10/31 0359) BP: (103-115)/(69-87) 115/87 (10/31 0359) SpO2:  [96 %-100 %] 99 % (10/31 0359)     Intake/Output from previous day: 10/30 0701 - 10/31 0700 In: -  Out: 741 [Urine:650; Stool:1; Chest Tube:90]   Physical Exam:  Cardiovascular: RRR Pulmonary: Coarse bilaterally Abdomen: Soft, non tender, bowel sounds present. Extremities: No  lower extremity edema. Wounds: Clean and dry.  No erythema or signs of infection. Chest Tube: to suction, ++++ air leak that worsens with cough  Lab Results: CBC: Recent Labs    09/17/18 0238  WBC 8.5  HGB 14.0  HCT 41.7  PLT 372   BMET:  Recent Labs    09/17/18 0238  NA 138  K 3.9  CL 100  CO2 29  GLUCOSE 102*  BUN 13  CREATININE 0.66  CALCIUM 8.6*    PT/INR: No results for input(s): LABPROT, INR in the last 72 hours. ABG:  INR: Will add last result for INR, ABG once components are confirmed Will add last 4 CBG results once components are confirmed  Assessment/Plan:  1. CV - SR 2.  Pulmonary - Chest tube with 90 cc output last 24 hours. Chest tube to suction. There is a ++++  air leak that worsens with cough. CXR this am appears stable (bilateral extensive subcutaneous emphysema and apical bullous emphysema bilaterally and a right basilar ptx). Continue Prednisone and Ellipta. Encourage incentive spirometer. Dr. Roxan Hockey to to place intrabronchial valves in am.   Donielle M  ZimmermanPA-C 09/18/2018,7:34 AM [phone]  Patient seen and examined, agree with above Still has an air leak. Plan bronchial valves tomorrow  Remo Lipps C. Roxan Hockey, MD [facility] [phone]

## 2018-09-18 NOTE — Progress Notes (Signed)
Patient ID: [name], male   DOB: Nov 19, 1976, 42 y.o.   MRN: [id number]  PROGRESS NOTE    RONDALE Farmer  BTD:[id number] DOB: 1976/05/09 DOA: 09/04/2018 PCP: Wallene Dales, MD   Brief Narrative:  42 year old male with history of asthma, GERD, hypothyroidism, aspergilloma, left upper lobe segmentectomy for aspergilloma/MAI, bullous lung disease presented on 09/04/2018 with shortness of breath and was found to have tension pneumothorax.  CVTS was consulted, right-sided chest tube was placed urgently.  Pneumothorax resolved with chest tube, this was removed on 09/07/2018.  Patient was found to have worsening dyspnea on 09/08/2018 and repeat chest x-ray showed recurrent tension pneumothorax which required another urgent chest tube on 09/08/2018.  He underwent  VATS/bleb resection, pleural abrasion on 09/12/2018 by CVTS. -With persistent air leak, plan for intrabronchial valves   Assessment & Plan:   Recurrent tension pneumothorax -Chest tube placed urgently on 09/04/2018 by Dr. Roxan Hockey which was subsequently removed on 09/07/2018 ; repeat chest x-ray was stable without pneumothorax -On 09/08/2018 and developed spontaneous tension pneumothorax again -underwent chest tube placement again by Dr. Roxan Hockey -Then had VATS, bleb resection, pleural abrasion on 10/25 -Now with subcutaneous emphysema -Chest x-ray shows small right basilar pneumothorax -Per CVTS, plan for intrabronchial valves tomorrow  History of MAI -Continue home regimen of ethambutol and azithromycin.  Outpatient follow-up by infectious disease  History of aspergilloma -History of remote left upper lobe segmentectomy -Continue antifungal Cresemba -Outpatient follow-up with pulmonary and ID -continue prednisone 5 mg daily  History of severe persistent asthma -Stable.  Continue bronchodilators  Hypothyroidism -Continue Synthroid  Iron deficiency anemia -Hemoglobin stable.  Continue home iron supplement  Severe  malnutrition -Follow nutrition recommendations  DVT prophylaxis: Heparin Code Status: Full Family Communication: None at bedside Disposition Plan: Home once cleared by CVTS  Consultants: CVTS  Procedures: Chest tube insertion x2, VATS  Antimicrobials:   Ethambutol/Zithromax/Cresemba  Subjective: -Feels okay, no new complaints  Objective: Vitals:   09/18/18 0041 09/18/18 0359 09/18/18 0800 09/18/18 0829  BP: 110/76 115/87  113/78  Pulse: 68 68  71  Resp: _0 Temp: 97.8 F (36.6 C) 98 F (36.7 C)  98 F (36.7 C)  TempSrc: Oral Oral  Oral  SpO2: 98% 99% 97% 100%  Weight:      Height:        Intake/Output Summary (Last 24 hours) at 09/18/2018 1110 Last data filed at 09/18/2018 1000 Gross per 24 hour  Intake 480 ml  Output 871 ml  Net -391 ml   Filed Weights   09/04/18 1330  Weight: 51.3 kg    Examination:  Gen: Awake, Alert, Oriented X 3, cachectic chronically ill-appearing male HEENT: Swelling of the face and neck, subcutaneous emphysema palpated Lungs: Poor air movement bilaterally CVS: RRR,No Gallops,Rubs or new Murmurs Abd: soft, Non tender, non distended, BS present Extremities: No Cyanosis, Clubbing or edema Skin: no new rashes  Data Reviewed: I have personally reviewed following labs and imaging studies  CBC: Recent Labs  Lab 09/11/18 1601 09/12/18 0059 09/13/18 0504 09/14/18 0249 09/17/18 0238  WBC 7.0 5.8 9.7 8.9 8.5  NEUTROABS  --   --   --   --  7.0  HGB 13.7 13.5 12.7* 12.6* 14.0  HCT 42.8 39.8 38.5* 38.5* 41.7  MCV 94.9 93.2 93.9 94.6 93.3  PLT 291 267 330 307 106   Basic Metabolic Panel: Recent Labs  Lab 09/12/18 0059 09/13/18 0504 09/14/18 0249 09/17/18 0238  NA 135 135 138 138  K 3.9 4.2 3.9 3.9  CL 98 100 98 100  CO2 _0 GLUCOSE 84 94 99 102*  BUN _1 CREATININE 0.67 0.55* 0.83 0.66  CALCIUM 8.4* 8.2* 8.8* 8.6*  MG  --   --   --  2.1   GFR: Estimated Creatinine Clearance: 85.5 mL/min (by  C-G formula based on SCr of 0.66 mg/dL). Liver Function Tests: Recent Labs  Lab 09/14/18 0249  AST 21  ALT 20  ALKPHOS 76  BILITOT 0.5  PROT 5.5*  ALBUMIN 2.3*   No results for input(s): LIPASE, AMYLASE in the last 168 hours. No results for input(s): AMMONIA in the last 168 hours. Coagulation Profile: Recent Labs  Lab 09/11/18 1601  INR 1.05   Cardiac Enzymes: No results for input(s): CKTOTAL, CKMB, CKMBINDEX, TROPONINI in the last 168 hours. BNP (last 3 results) No results for input(s): PROBNP in the last 8760 hours. HbA1C: No results for input(s): HGBA1C in the last 72 hours. CBG: No results for input(s): GLUCAP in the last 168 hours. Lipid Profile: No results for input(s): CHOL, HDL, LDLCALC, TRIG, CHOLHDL, LDLDIRECT in the last 72 hours. Thyroid Function Tests: No results for input(s): TSH, T4TOTAL, FREET4, T3FREE, THYROIDAB in the last 72 hours. Anemia Panel: No results for input(s): VITAMINB12, FOLATE, FERRITIN, TIBC, IRON, RETICCTPCT in the last 72 hours. Sepsis Labs: No results for input(s): PROCALCITON, LATICACIDVEN in the last 168 hours.  No results found for this or any previous visit (from the past 240 hour(s)).       Radiology Studies: Dg Chest Port 1 View  Result Date: 09/18/2018 CLINICAL DATA:  Pneumothorax EXAM: PORTABLE CHEST 1 VIEW COMPARISON:  09/17/2018 FINDINGS: Right chest tube is stable. Small pneumothorax at the right base is stable. Apical blebs are unchanged. Normal heart size. Stable emphysema across the chest. IMPRESSION: Stable right chest tube and small right basilar pneumothorax. Electronically Signed   By: Marybelle Killings M.D.   On: 09/18/2018 09:18   Dg Chest Port 1 View  Result Date: 09/17/2018 CLINICAL DATA:  Followup right chest tube for a spontaneous pneumothorax. EXAM: PORTABLE CHEST 1 VIEW COMPARISON:  Yesterday. FINDINGS: Stable right chest tube. Mild increase in size of a small right basilar pneumothorax, currently occupying  approximately 5% of the volume of the right hemithorax. Stable biapical bullous changes and bilateral surgical staples. Mildly decreased bilateral subcutaneous emphysema. Normal sized heart. Unremarkable bones. IMPRESSION: 1. Mild increase in size of a small right basilar pneumothorax, currently occupying approximately 5% of the volume of the right hemithorax. 2. Stable changes of COPD. Electronically Signed   By: Claudie Revering M.D.   On: 09/17/2018 10:47        Scheduled Meds: . azithromycin  500 mg Oral Daily  . bisacodyl  10 mg Oral Daily  . enoxaparin (LOVENOX) injection  40 mg Subcutaneous Daily  . ethambutol  1,000 mg Oral Daily  . feeding supplement (ENSURE ENLIVE)  237 mL Per Tube TID BM  . fluticasone furoate-vilanterol  1 puff Inhalation Daily  . HYDROmorphone   Intravenous Q4H  . Isavuconazonium Sulfate  372 mg Oral Daily  . levothyroxine  75 mcg Oral QAC breakfast  . predniSONE  5 mg Oral Q breakfast  . senna-docusate  1 tablet Oral QHS  . umeclidinium bromide  1 puff Inhalation Daily   Continuous Infusions: . potassium chloride       LOS: 14 days        Domenic Polite,  MD [facility] Page via [email or web address]  If 7PM-7AM, please contact night-coverage [email or web address] Password [email or web address] 09/18/2018, 11:10 AM

## 2018-09-18 NOTE — Progress Notes (Addendum)
[address]       [place] [address]             [phone]       6 Days Post-Op Procedure(s) (LRB): VIDEO ASSISTED THORACOSCOPY, right lung (Right) STAPLING OF BLEBS, right lower and middle lung lobes (Right)  Subjective: Patient without specific complaints this am.  Objective: Vital signs in last 24 hours: Temp:  [97.8 F (36.6 C)-98.3 F (36.8 C)] 98 F (36.7 C) (10/31 0359) Pulse Rate:  [66-68] 68 (10/31 0359) Cardiac Rhythm: Normal sinus rhythm (10/31 0700) Resp:  [13-19] 13 (10/31 0359) BP: (103-115)/(69-87) 115/87 (10/31 0359) SpO2:  [96 %-100 %] 99 % (10/31 0359)     Intake/Output from previous day: 10/30 0701 - 10/31 0700 In: -  Out: 741 [Urine:650; Stool:1; Chest Tube:90]   Physical Exam:  Cardiovascular: RRR Pulmonary: Coarse bilaterally Abdomen: Soft, non tender, bowel sounds present. Extremities: No  lower extremity edema. Wounds: Clean and dry.  No erythema or signs of infection. Chest Tube: to suction, ++++ air leak that worsens with cough  Lab Results: CBC: Recent Labs    09/17/18 0238  WBC 8.5  HGB 14.0  HCT 41.7  PLT 372   BMET:  Recent Labs    09/17/18 0238  NA 138  K 3.9  CL 100  CO2 29  GLUCOSE 102*  BUN 13  CREATININE 0.66  CALCIUM 8.6*    PT/INR: No results for input(s): LABPROT, INR in the last 72 hours. ABG:  INR: Will add last result for INR, ABG once components are confirmed Will add last 4 CBG results once components are confirmed  Assessment/Plan:  1. CV - SR 2.  Pulmonary - Chest tube with 90 cc output last 24 hours. Chest tube to suction. There is a ++++  air leak that worsens with cough. CXR this am appears stable (bilateral extensive subcutaneous emphysema and apical bullous emphysema bilaterally and a right basilar ptx). Continue Prednisone and Ellipta. Encourage incentive spirometer. Dr. Roxan Hockey to to place intrabronchial valves in am.   Donielle M  ZimmermanPA-C 09/18/2018,7:34 AM [phone]  Patient seen and examined, agree with above Still has an air leak. Plan bronchial valves tomorrow  Remo Lipps C. Roxan Hockey, MD [facility] [phone]

## 2018-09-19 ENCOUNTER — Encounter (HOSPITAL_COMMUNITY)
Admission: EM | Disposition: A | Discharge status: Home Health | Payer: Self-pay | Source: Home / Self Care | Attending: Internal Medicine

## 2018-09-19 ENCOUNTER — Encounter (HOSPITAL_COMMUNITY): Payer: Self-pay | Admitting: Certified Registered Nurse Anesthetist

## 2018-09-19 ENCOUNTER — Inpatient Hospital Stay (HOSPITAL_COMMUNITY): Payer: BLUE CROSS/BLUE SHIELD

## 2018-09-19 DIAGNOSIS — J95812 Postprocedural air leak: Secondary | ICD-10-CM

## 2018-09-19 HISTORY — PX: INSERTION OF IBV VALVE: SHX5091

## 2018-09-19 HISTORY — PX: VIDEO BRONCHOSCOPY: SHX5072

## 2018-09-19 SURGERY — BRONCHOSCOPY, VIDEO-ASSISTED
Anesthesia: General

## 2018-09-19 MED ORDER — FENTANYL CITRATE (PF) 250 MCG/5ML IJ SOLN
INTRAMUSCULAR | Status: AC
  Filled 2018-09-19: qty 5

## 2018-09-19 MED ORDER — MIDAZOLAM HCL 2 MG/2ML IJ SOLN
INTRAMUSCULAR | Status: AC
  Filled 2018-09-19: qty 2

## 2018-09-19 MED ORDER — ROCURONIUM BROMIDE 10 MG/ML (PF) SYRINGE
PREFILLED_SYRINGE | INTRAVENOUS | Status: DC | PRN
  Administered 2018-09-19: 50 mg via INTRAVENOUS
  Administered 2018-09-19 (×2): 20 mg via INTRAVENOUS

## 2018-09-19 MED ORDER — FENTANYL CITRATE (PF) 100 MCG/2ML IJ SOLN: 25.0000 ug | INTRAMUSCULAR | Status: DC | PRN

## 2018-09-19 MED ORDER — SUGAMMADEX SODIUM 200 MG/2ML IV SOLN
INTRAVENOUS | Status: DC | PRN
  Administered 2018-09-19: 102.6 mg via INTRAVENOUS

## 2018-09-19 MED ORDER — ROCURONIUM BROMIDE 50 MG/5ML IV SOSY
PREFILLED_SYRINGE | INTRAVENOUS | Status: AC
  Filled 2018-09-19: qty 5

## 2018-09-19 MED ORDER — ENOXAPARIN SODIUM 40 MG/0.4ML ~~LOC~~ SOLN: 40.0000 mg | SUBCUTANEOUS | Status: DC

## 2018-09-19 MED ORDER — LIDOCAINE 2% (20 MG/ML) 5 ML SYRINGE
INTRAMUSCULAR | Status: AC
  Filled 2018-09-19: qty 5

## 2018-09-19 MED ORDER — ADULT MULTIVITAMIN W/MINERALS CH
1.0000 | ORAL_TABLET | Freq: Every day | ORAL | Status: DC
  Administered 2018-09-20 – 2018-10-06 (×17): 1 via ORAL
  Filled 2018-09-19 (×17): qty 1

## 2018-09-19 MED ORDER — EPINEPHRINE PF 1 MG/ML IJ SOLN
INTRAMUSCULAR | Status: AC
  Filled 2018-09-19: qty 1

## 2018-09-19 MED ORDER — ONDANSETRON HCL 4 MG/2ML IJ SOLN
INTRAMUSCULAR | Status: AC
  Filled 2018-09-19: qty 2

## 2018-09-19 MED ORDER — SODIUM CHLORIDE 0.9 % IV SOLN
1.5000 g | INTRAVENOUS | Status: DC
  Filled 2018-09-19: qty 1.5

## 2018-09-19 MED ORDER — PROPOFOL 10 MG/ML IV BOLUS
INTRAVENOUS | Status: DC | PRN
  Administered 2018-09-19: 110 mg via INTRAVENOUS

## 2018-09-19 MED ORDER — ONDANSETRON HCL 4 MG/2ML IJ SOLN
INTRAMUSCULAR | Status: DC | PRN
  Administered 2018-09-19: 4 mg via INTRAVENOUS

## 2018-09-19 MED ORDER — KETOROLAC TROMETHAMINE 30 MG/ML IJ SOLN
INTRAMUSCULAR | Status: AC
  Filled 2018-09-19: qty 1

## 2018-09-19 MED ORDER — PROPOFOL 10 MG/ML IV BOLUS
INTRAVENOUS | Status: AC
  Filled 2018-09-19: qty 20

## 2018-09-19 MED ORDER — ENOXAPARIN SODIUM 40 MG/0.4ML ~~LOC~~ SOLN
40.0000 mg | SUBCUTANEOUS | Status: DC
  Administered 2018-09-19 – 2018-10-05 (×17): 40 mg via SUBCUTANEOUS
  Filled 2018-09-19 (×17): qty 0.4

## 2018-09-19 MED ORDER — LACTATED RINGERS IV SOLN
INTRAVENOUS | Status: DC
  Administered 2018-09-19: 09:00:00 via INTRAVENOUS

## 2018-09-19 MED ORDER — DEXAMETHASONE SODIUM PHOSPHATE 10 MG/ML IJ SOLN
INTRAMUSCULAR | Status: DC | PRN
  Administered 2018-09-19: 10 mg via INTRAVENOUS

## 2018-09-19 MED ORDER — DEXAMETHASONE SODIUM PHOSPHATE 10 MG/ML IJ SOLN
INTRAMUSCULAR | Status: AC
  Filled 2018-09-19: qty 1

## 2018-09-19 MED ORDER — LIDOCAINE 2% (20 MG/ML) 5 ML SYRINGE
INTRAMUSCULAR | Status: DC | PRN
  Administered 2018-09-19: 60 mg via INTRAVENOUS

## 2018-09-19 MED ORDER — FENTANYL CITRATE (PF) 250 MCG/5ML IJ SOLN
INTRAMUSCULAR | Status: DC | PRN
  Administered 2018-09-19: 100 ug via INTRAVENOUS

## 2018-09-19 MED ORDER — MIDAZOLAM HCL 2 MG/2ML IJ SOLN
INTRAMUSCULAR | Status: DC | PRN
  Administered 2018-09-19: 2 mg via INTRAVENOUS

## 2018-09-19 MED ORDER — 0.9 % SODIUM CHLORIDE (POUR BTL) OPTIME
TOPICAL | Status: DC | PRN
  Administered 2018-09-19: 1000 mL

## 2018-09-19 SURGICAL SUPPLY — 47 items
CANISTER SUCT 3000ML PPV (MISCELLANEOUS) ×3 IMPLANT
CATH BALLN 4FR (CATHETERS) ×2 IMPLANT
CATH EMB 4FR 80CM (CATHETERS) ×2 IMPLANT
CATH EMB 5FR 80CM (CATHETERS) ×2 IMPLANT
CATH EMB 6FR 80CM (CATHETERS) ×2 IMPLANT
CONT SPEC 4OZ CLIKSEAL STRL BL (MISCELLANEOUS) ×6 IMPLANT
COVER BACK TABLE 60X90IN (DRAPES) ×3 IMPLANT
COVER WAND RF STERILE (DRAPES) ×3 IMPLANT
FORCEPS BIOP RJ4 1.8 (CUTTING FORCEPS) ×2 IMPLANT
GAUZE PETROLATUM 1 X8 (GAUZE/BANDAGES/DRESSINGS) ×4 IMPLANT
GAUZE SPONGE 4X4 12PLY STRL (GAUZE/BANDAGES/DRESSINGS) ×5 IMPLANT
GAUZE SPONGE 4X4 12PLY STRL LF (GAUZE/BANDAGES/DRESSINGS) ×2 IMPLANT
GLOVE BIO SURGEON STRL SZ 6.5 (GLOVE) ×3 IMPLANT
GLOVE BIO SURGEONS STRL SZ 6.5 (GLOVE) ×3
GLOVE BIOGEL PI IND STRL 6.5 (GLOVE) IMPLANT
GLOVE BIOGEL PI INDICATOR 6.5 (GLOVE) ×6
GLOVE SURG SIGNA 7.5 PF LTX (GLOVE) ×5 IMPLANT
GOWN STRL REUS W/ TWL LRG LVL3 (GOWN DISPOSABLE) ×1 IMPLANT
GOWN STRL REUS W/ TWL XL LVL3 (GOWN DISPOSABLE) ×1 IMPLANT
GOWN STRL REUS W/TWL LRG LVL3 (GOWN DISPOSABLE) ×4
GOWN STRL REUS W/TWL XL LVL3 (GOWN DISPOSABLE) ×2
KIT AIRWAY SIZING W/B5-23 BALL (VALVE) ×2 IMPLANT
KIT CLEAN ENDO COMPLIANCE (KITS) ×3 IMPLANT
KIT TURNOVER KIT B (KITS) ×3 IMPLANT
MARKER SKIN DUAL TIP RULER LAB (MISCELLANEOUS) ×3 IMPLANT
NS IRRIG 1000ML POUR BTL (IV SOLUTION) ×3 IMPLANT
OIL SILICONE PENTAX (PARTS (SERVICE/REPAIRS)) ×3 IMPLANT
PAD ARMBOARD 7.5X6 YLW CONV (MISCELLANEOUS) ×6 IMPLANT
STOPCOCK 4 WAY LG BORE MALE ST (IV SETS) ×4 IMPLANT
STOPCOCK MORSE 400PSI 3WAY (MISCELLANEOUS) ×3 IMPLANT
SYR 10ML LL (SYRINGE) ×3 IMPLANT
SYR 20ML ECCENTRIC (SYRINGE) ×6 IMPLANT
SYR 5ML LL (SYRINGE) ×3 IMPLANT
SYR 5ML LUER SLIP (SYRINGE) ×5 IMPLANT
TOWEL GREEN STERILE (TOWEL DISPOSABLE) ×3 IMPLANT
TOWEL GREEN STERILE FF (TOWEL DISPOSABLE) ×3 IMPLANT
TOWEL NATURAL 4PK STERILE (DISPOSABLE) ×3 IMPLANT
TRAP SPECIMEN MUCOUS 40CC (MISCELLANEOUS) ×3 IMPLANT
TUBE CONNECTING 20'X1/4 (TUBING) ×3
TUBE CONNECTING 20X1/4 (TUBING) ×4 IMPLANT
UNDERPAD 30X30 (UNDERPADS AND DIAPERS) ×3 IMPLANT
VALVE BIOPSY  SINGLE USE (MISCELLANEOUS) ×2
VALVE BIOPSY SINGLE USE (MISCELLANEOUS) ×1 IMPLANT
VALVE IN CARTRIDGE 6MM HUD (Valve) ×2 IMPLANT
VALVE IN CARTRIDGE 7MM HUD (Valve) ×4 IMPLANT
VALVE IN CARTRIDGE 9MM HUD (Valve) ×4 IMPLANT
VALVE SUCTION BRONCHIO DISP (MISCELLANEOUS) ×3 IMPLANT

## 2018-09-19 NOTE — Anesthesia Preprocedure Evaluation (Addendum)
Anesthesia Evaluation  Patient identified by MRN, date of birth, ID band Patient awake    Reviewed: Allergy & Precautions, H&P , NPO status , Patient's Chart, lab work & pertinent test results  Airway Mallampati: II  TM Distance: >3 FB Neck ROM: Full    Dental no notable dental hx. (+) Teeth Intact, Dental Advisory Given   Pulmonary asthma , former smoker,    Pulmonary exam normal  + decreased breath sounds      Cardiovascular negative cardio ROS   Rhythm:Regular Rate:Normal     Neuro/Psych negative neurological ROS  negative psych ROS   GI/Hepatic Neg liver ROS, GERD  ,  Endo/Other  Hypothyroidism   Renal/GU negative Renal ROS  negative genitourinary   Musculoskeletal   Abdominal   Peds  Hematology negative hematology ROS (+)   Anesthesia Other Findings   Reproductive/Obstetrics negative OB ROS                            Anesthesia Physical Anesthesia Plan  ASA: II  Anesthesia Plan: General   Post-op Pain Management:    Induction: Intravenous  PONV Risk Score and Plan: 3 and Ondansetron, Dexamethasone and Midazolam  Airway Management Planned: Oral ETT  Additional Equipment:   Intra-op Plan:   Post-operative Plan: Extubation in OR  Informed Consent: I have reviewed the patients History and Physical, chart, labs and discussed the procedure including the risks, benefits and alternatives for the proposed anesthesia with the patient or authorized representative who has indicated his/her understanding and acceptance.   Dental advisory given  Plan Discussed with: CRNA  Anesthesia Plan Comments:         Anesthesia Quick Evaluation

## 2018-09-19 NOTE — Anesthesia Procedure Notes (Signed)
Procedure Name: Intubation Date/Time: 09/19/2018 10:17 AM Performed by: Julieta Bellini, CRNA Pre-anesthesia Checklist: Patient identified, Emergency Drugs available, Patient being monitored and Suction available Patient Re-evaluated:Patient Re-evaluated prior to induction Oxygen Delivery Method: Circle system utilized Preoxygenation: Pre-oxygenation with 100% oxygen Induction Type: IV induction Ventilation: Oral airway inserted - appropriate to patient size and Mask ventilation without difficulty Laryngoscope Size: Mac and 4 Grade View: Grade I Tube type: Oral Tube size: 8.5 mm Number of attempts: 1 Airway Equipment and Method: Stylet Placement Confirmation: ETT inserted through vocal cords under direct vision,  positive ETCO2 and breath sounds checked- equal and bilateral Secured at: 24 cm Tube secured with: Tape Dental Injury: Teeth and Oropharynx as per pre-operative assessment

## 2018-09-19 NOTE — Progress Notes (Signed)
Patient ID: [name], male   DOB: 02/15/1976, 42 y.o.   MRN: [id number]  PROGRESS NOTE    [name]  IHK:[id number] DOB: 07/30/1976 DOA: 09/04/2018 PCP: Wallene Dales, MD   Brief Narrative:  42 year old male with history of asthma, GERD, hypothyroidism, aspergilloma, left upper lobe segmentectomy for aspergilloma/MAI, bullous lung disease presented on 09/04/2018 with shortness of breath and was found to have tension pneumothorax.  CVTS was consulted, right-sided chest tube was placed urgently.  Pneumothorax resolved with chest tube, this was removed on 09/07/2018.  Patient was found to have worsening dyspnea on 09/08/2018 and repeat chest x-ray showed recurrent tension pneumothorax which required another urgent chest tube on 09/08/2018.  He underwent  VATS/bleb resection, pleural abrasion on 09/12/2018 by CVTS. -With persistent air leak, plan for intrabronchial valves   Assessment & Plan:   Recurrent tension pneumothorax -Chest tube placed urgently on 09/04/2018 by Dr. Roxan Hockey, which was subsequently removed on 09/07/2018 ; repeat chest x-ray was stable without pneumothorax -On 09/08/2018 and developed spontaneous tension pneumothorax again -underwent chest tube placement again  -Then had VATS, bleb resection, pleural abrasion on 10/25 -Now with subcutaneous emphysema -Chest x-ray shows small right basilar pneumothorax -Per CVTS, plan for intrabronchial valves today -Remains hemodynamically stable  History of MAI -Continue home regimen of ethambutol and azithromycin.  Outpatient follow-up by infectious disease  History of aspergilloma -History of remote left upper lobe segmentectomy -Continue antifungal Cresemba -Outpatient follow-up with pulmonary and ID -continue prednisone 5 mg daily  History of severe persistent asthma -Stable.  Continue bronchodilators  Hypothyroidism -Continue Synthroid  Iron deficiency anemia -Hemoglobin stable.  Continue home iron  supplement  Severe malnutrition -Follow nutrition recommendations  DVT prophylaxis: Heparin Code Status: Full Family Communication: None at bedside Disposition Plan: Home once cleared by CVTS  Consultants: CVTS  Procedures: Chest tube insertion x2, VATS  Antimicrobials:   Ethambutol/Zithromax/Cresemba  Subjective: -For surgery later this morning  Objective: Vitals:   09/19/18 1300 09/19/18 1315 09/19/18 1330 09/19/18 1345  BP: 106/75 111/78 (!) 88/55 (!) 93/56  Pulse: 90 (!) 103 89   Resp: 19 (!) 26 20   Temp:      TempSrc:      SpO2: 96% 99% 97%   Weight:      Height:        Intake/Output Summary (Last 24 hours) at 09/19/2018 1351 Last data filed at 09/19/2018 1200 Gross per 24 hour  Intake 980 ml  Output 707 ml  Net 273 ml   Filed Weights   09/04/18 1330 09/19/18 0913  Weight: 51.3 kg 51.3 kg    Examination:  Gen: Awake, Alert, Oriented X 3, cachectic, chronically ill-appearing male HEENT: Subcutaneous emphysema with swelling of the face and neck Lungs: Poor air movement bilaterally CVS: RRR,No Gallops,Rubs or new Murmurs Abd: soft, Non tender, non distended, BS present Extremities: No Cyanosis, Clubbing or edema Skin: no new rashes  Data Reviewed: I have personally reviewed following labs and imaging studies  CBC: Recent Labs  Lab 09/13/18 0504 09/14/18 0249 09/17/18 0238  WBC 9.7 8.9 8.5  NEUTROABS  --   --  7.0  HGB 12.7* 12.6* 14.0  HCT 38.5* 38.5* 41.7  MCV 93.9 94.6 93.3  PLT 330 307 756   Basic Metabolic Panel: Recent Labs  Lab 09/13/18 0504 09/14/18 0249 09/17/18 0238  NA 135 138 138  K 4.2 3.9 3.9  CL 100 98 100  CO2 _0 GLUCOSE 94 99 102*  BUN 7  13 13  CREATININE 0.55* 0.83 0.66  CALCIUM 8.2* 8.8* 8.6*  MG  --   --  2.1   GFR: Estimated Creatinine Clearance: 85.5 mL/min (by C-G formula based on SCr of 0.66 mg/dL). Liver Function Tests: Recent Labs  Lab 09/14/18 0249  AST 21  ALT 20  ALKPHOS 76  BILITOT 0.5   PROT 5.5*  ALBUMIN 2.3*   No results for input(s): LIPASE, AMYLASE in the last 168 hours. No results for input(s): AMMONIA in the last 168 hours. Coagulation Profile: No results for input(s): INR, PROTIME in the last 168 hours. Cardiac Enzymes: No results for input(s): CKTOTAL, CKMB, CKMBINDEX, TROPONINI in the last 168 hours. BNP (last 3 results) No results for input(s): PROBNP in the last 8760 hours. HbA1C: No results for input(s): HGBA1C in the last 72 hours. CBG: No results for input(s): GLUCAP in the last 168 hours. Lipid Profile: No results for input(s): CHOL, HDL, LDLCALC, TRIG, CHOLHDL, LDLDIRECT in the last 72 hours. Thyroid Function Tests: No results for input(s): TSH, T4TOTAL, FREET4, T3FREE, THYROIDAB in the last 72 hours. Anemia Panel: No results for input(s): VITAMINB12, FOLATE, FERRITIN, TIBC, IRON, RETICCTPCT in the last 72 hours. Sepsis Labs: No results for input(s): PROCALCITON, LATICACIDVEN in the last 168 hours.  No results found for this or any previous visit (from the past 240 hour(s)).       Radiology Studies: Dg Chest Port 1 View  Result Date: 09/18/2018 CLINICAL DATA:  Pneumothorax EXAM: PORTABLE CHEST 1 VIEW COMPARISON:  09/17/2018 FINDINGS: Right chest tube is stable. Small pneumothorax at the right base is stable. Apical blebs are unchanged. Normal heart size. Stable emphysema across the chest. IMPRESSION: Stable right chest tube and small right basilar pneumothorax. Electronically Signed   By: Marybelle Killings M.D.   On: 09/18/2018 09:18        Scheduled Meds: . [MAR Hold] azithromycin  500 mg Oral Daily  . [MAR Hold] bisacodyl  10 mg Oral Daily  . [MAR Hold] enoxaparin (LOVENOX) injection  40 mg Subcutaneous Daily  . [MAR Hold] ethambutol  1,000 mg Oral Daily  . [MAR Hold] feeding supplement (ENSURE ENLIVE)  237 mL Per Tube TID BM  . [MAR Hold] fluticasone furoate-vilanterol  1 puff Inhalation Daily  . [MAR Hold] HYDROmorphone   Intravenous  Q4H  . [MAR Hold] Isavuconazonium Sulfate  372 mg Oral Daily  . [MAR Hold] levothyroxine  75 mcg Oral QAC breakfast  . [START ON 09/20/2018] multivitamin with minerals  1 tablet Oral Daily  . [MAR Hold] predniSONE  5 mg Oral Q breakfast  . [MAR Hold] senna-docusate  1 tablet Oral QHS  . [MAR Hold] umeclidinium bromide  1 puff Inhalation Daily   Continuous Infusions: . [START ON 09/20/2018] cefUROXime (ZINACEF)  IV    . lactated ringers 10 mL/hr at 09/19/18 0917  . [MAR Hold] potassium chloride       LOS: 15 days        Domenic Polite, MD [facility] Page via [email or web address]  If 7PM-7AM, please contact night-coverage [email or web address] Password [email or web address] 09/19/2018, 1:51 PM

## 2018-09-19 NOTE — Anesthesia Postprocedure Evaluation (Signed)
Anesthesia Post Note  Patient: [name]  Procedure(s) Performed: VIDEO BRONCHOSCOPY (N/A ) INSERTION OF INTERBRONCHIAL VALVE (IBV) (N/A )     Patient location during evaluation: PACU Anesthesia Type: General Level of consciousness: awake and alert Pain management: pain level controlled Vital Signs Assessment: post-procedure vital signs reviewed and stable Respiratory status: spontaneous breathing, nonlabored ventilation, respiratory function stable and patient connected to nasal cannula oxygen Cardiovascular status: blood pressure returned to baseline and stable Postop Assessment: no apparent nausea or vomiting Anesthetic complications: no    Last Vitals:  Vitals:   09/19/18 1330 09/19/18 1345  BP: (!) 88/55 (!) 93/56  Pulse: 89 86  Resp: 20 18  Temp:  36.7 C  SpO2: 97% 98%    Last Pain:  Vitals:   09/19/18 1345  TempSrc:   PainSc: 0-No pain                 Marcelis Wissner,W. EDMOND

## 2018-09-19 NOTE — Brief Op Note (Signed)
09/19/2018  11:35 AM  PATIENT:  [name]  42 y.o. male  PRE-OPERATIVE DIAGNOSIS:  AIR LEAK AFTER BLEB RESECTION  POST-OPERATIVE DIAGNOSIS: AIR LEAK AFTER BLEB RESECTION  PROCEDURE:  Procedure(s): VIDEO BRONCHOSCOPY (N/A) INSERTION OF INTERBRONCHIAL VALVE (IBV) (N/A) x 4  SURGEON:  Surgeon(s) and Role:    * Melrose Nakayama, MD - Primary  PHYSICIAN ASSISTANT:   ASSISTANTS: none   ANESTHESIA:   general  EBL:  2 mL   BLOOD ADMINISTERED:none  DRAINS: IBV in RLL airways   LOCAL MEDICATIONS USED:  NONE  SPECIMEN:  No Specimen  DISPOSITION OF SPECIMEN:  N/A  COUNTS:  NO endoscopic  TOURNIQUET:  * No tourniquets in log *  DICTATION: .Other Dictation: Dictation Number -  PLAN OF CARE: already inpatient  PATIENT DISPOSITION:  PACU - hemodynamically stable.   Delay start of Pharmacological VTE agent (>24hrs) due to surgical blood loss or risk of bleeding: no

## 2018-09-19 NOTE — Transfer of Care (Signed)
Immediate Anesthesia Transfer of Care Note  Patient: [name]  Procedure(s) Performed: VIDEO BRONCHOSCOPY (N/A ) INSERTION OF INTERBRONCHIAL VALVE (IBV) (N/A )  Patient Location: PACU  Anesthesia Type:General  Level of Consciousness: awake, alert , oriented and patient cooperative  Airway & Oxygen Therapy: Patient Spontanous Breathing and Patient connected to face mask oxygen  Post-op Assessment: Report given to RN, Post -op Vital signs reviewed and stable and Patient moving all extremities X 4  Post vital signs: Reviewed and stable  Last Vitals:  Vitals Value Taken Time  BP 135/92 09/19/2018 11:45 AM  Temp 36.7 C 09/19/2018 11:45 AM  Pulse 91 09/19/2018 11:59 AM  Resp 19 09/19/2018 11:59 AM  SpO2 100 % 09/19/2018 11:59 AM  Vitals shown include unvalidated device data.  Last Pain:  Vitals:   09/19/18 1145  TempSrc:   PainSc: 0-No pain      Patients Stated Pain Goal: 0 (36/46/80 3212)  Complications: No apparent anesthesia complications   Dr. Ola Spurr notified of patient requiring 10L O2 facemask with O2 sat 96%. Also notified on new subQ air over the right chest. Dr. Roxan Hockey notified as well on both situations. Pt's chest tube connected to suction. Pt alert, oriented, and states he has "no difficulty breathing." Patient O2 sat now 100%.

## 2018-09-19 NOTE — Progress Notes (Signed)
PT Cancellation Note  Patient Details Name: BUFFORD HELMS MRN: [id number] DOB: 08/21/1976   Cancelled Treatment:    Reason Eval/Treat Not Completed: Patient at procedure or test/unavailable. Planned insertion of interbronchial valve.  Ellamae Sia, PT, DPT Acute Rehabilitation Services Pager [phone] Office [phone]    Willy Eddy 09/19/2018, 11:37 AM

## 2018-09-19 NOTE — Progress Notes (Signed)
Nutrition Follow-up  DOCUMENTATION CODES:   Severe malnutrition in context of chronic illness, Underweight  INTERVENTION:   -Once diet is resumed, continue:  Ensure Enlive po TID, each supplement provides 350 kcal and 20 grams of protein -MVI with minerals daily  NUTRITION DIAGNOSIS:   Severe Malnutrition related to chronic illness(bullous lung disease ) as evidenced by severe fat depletion, severe muscle depletion.  Ongoing  GOAL:   Patient will meet greater than or equal to 90% of their needs  Progressing  MONITOR:   PO intake, Supplement acceptance, Labs, Skin, Weight trends, I & O's  REASON FOR ASSESSMENT:   Malnutrition Screening Tool    ASSESSMENT:   42 yo Male with PMH of MAI, bullous lung disease, left upper lobe segmentectomy for aspergilloma, reflux and hypothyroidism. He presented to [facility] today after noting onset of shortness of breath.  10/25- s/p Procedure(s): VIDEO ASSISTED THORACOSCOPY (Right), STAPLING OF BLEBS (Right) 11/1- s/p Procedure(s): VIDEO BRONCHOSCOPY (N/A), INSERTION OF INTERBRONCHIAL VALVE (IBV) (N/A)  Reviewed I/O's: 195 ml output x 24 hours and -3.8 L since 09/05/18  Chest tube output: 330 ml x 24 hours  Pt down in OR for interbronchial valve insertion due to air leak.   Noted intake is variable; noted meal completion 10-100%. Pt is consuming Ensure supplements per MAR.  Wt has stable since admission.   Medications reviewed and include prednisone.   Labs reviewed.   Diet Order:   Diet Order            Diet NPO time specified Except for: Sips with Meds  Diet effective now              EDUCATION NEEDS:   No education needs have been identified at this time  Skin:  Skin Assessment: Skin Integrity Issues: Skin Integrity Issues:: Incisions Incisions: closed rt chest, chest tube  Last BM:  09/18/18  Height:   Ht Readings from Last 1 Encounters:  09/19/18 5' 7.01" (1.702 m)    Weight:   Wt Readings from Last 1  Encounters:  09/19/18 51.3 kg    Ideal Body Weight:  67.3 kg  BMI:  Body mass index is 17.69 kg/m.  Estimated Nutritional Needs:   Kcal:  1700-1900  Protein:  80-95 gm  Fluid:  1.7-1.9 L    Esta Carmon A. Jimmye Norman, RD, LDN, CDE Pager: [phone] After hours Pager: [phone]

## 2018-09-19 NOTE — Op Note (Signed)
NAME: [name], [name] MEDICAL RECORD PO:[id number] ACCOUNT [id number] DATE OF BIRTH:[birth year] FACILITY: [facility] LOCATION: [facility] PHYSICIAN:STEVEN Chaya Jan, MD  OPERATIVE REPORT  DATE OF PROCEDURE:  09/19/2018  PREOPERATIVE DIAGNOSIS:  Prolonged air leak after bleb resection.  POSTOPERATIVE DIAGNOSIS:  Prolonged air leak after bleb resection.  PROCEDURE:  Bronchoscopy and intrabronchial valve replacement.  SURGEON:  Modesto Charon, MD  ASSISTANT:  None.  ANESTHESIA:  General.  FINDINGS:  Air leak markedly decreased with an obstruction of lower lobe bronchus.  No change with obstruction of the upper lobe or middle lobe bronchus. 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  He had presented with a pneumothorax.  Shortly after his chest tube was removed, he had a recurrent  pneumothorax.  He was advised to undergo surgical bleb resection. That was performed, but he developed a large air leak postoperatively.  He was advised to undergo intrabronchial valve replacement for management of the air leak.  The indications, risks,  benefits, and alternatives were discussed in detail with the patient.  He understood and accepted the risks and agreed to proceed.  OPERATIVE NOTE:  The patient was brought to the operating room on 09/19/2018.  He had induction of general anesthesia and was intubated.  Flexible fiberoptic bronchoscopy was performed via the endotracheal tube.  There were minimal secretions.  There was  normal endobronchial anatomy.  A Fogarty catheter was inserted through the bronchoscope.  Inflation in the right main stem bronchus eliminated the air leak.  The catheter then was inflated in various different positions.  Inflation in the upper lobe did  not change the air leak at all.  Inflation in the bronchus intermedius eliminated the air  leak.  Inflation of the middle lobe did not affect the air leak in any way.  Inflation in the lower lobe markedly diminished the air leak.  The primary culprit site  appeared to be a shared posterolateral bronchus.  The sizing balloon then was used to measure the various lower lobe orifices.  A 9 mm valve was placed at the posterolateral orifice and a 6 mm balloon was placed in the anterior orifice.  There was still  air leak and it was clear that the posterolateral valve was not completely occlusive.  Valves were also placed in the medial segmental and superior segmental bronchi.  These were both 7 mm valves.  The air leak was improved, but not sufficiently and  inspection of the 9 mm valve showed that it really was not seated well enough to be certain of a complete seal, even with postoperative swelling.  Therefore, the valve was removed and a new 9 mm valve was placed.  It was seated much more effectively and  there was a near complete resolution of the air leak.    The patient was then extubated in the operating room and taken to the [facility] in good condition.  AN/NUANCE  D:09/19/2018 T:09/19/2018 JOB:[id number]

## 2018-09-19 NOTE — Interval H&P Note (Signed)
History and Physical Interval Note:  09/19/2018 9:20 AM  [name]  has presented today for surgery, with the diagnosis of AIRLEAK  The various methods of treatment have been discussed with the patient and family. After consideration of risks, benefits and other options for treatment, the patient has consented to  Procedure(s): VIDEO BRONCHOSCOPY (N/A) INSERTION OF INTERBRONCHIAL VALVE (IBV) (N/A) as a surgical intervention .  The patient's history has been reviewed, patient examined, no change in status, stable for surgery.  I have reviewed the patient's chart and labs.  Questions were answered to the patient's satisfaction.     Melrose Nakayama

## 2018-09-20 ENCOUNTER — Inpatient Hospital Stay (HOSPITAL_COMMUNITY): Payer: BLUE CROSS/BLUE SHIELD

## 2018-09-20 MED ORDER — ACETAMINOPHEN 325 MG PO TABS
650.0000 mg | ORAL_TABLET | Freq: Four times a day (QID) | ORAL | Status: DC | PRN
  Administered 2018-09-30 – 2018-10-03 (×5): 650 mg via ORAL
  Filled 2018-09-20 (×6): qty 2

## 2018-09-20 NOTE — Progress Notes (Addendum)
Patient ID: [name], male   DOB: May 24, 1976, 42 y.o.   MRN: [id number]  PROGRESS NOTE    [name]  [id number]:[id number] DOB: 1976-06-03 DOA: 09/04/2018 PCP: Brett Dales, MD   Brief Narrative:  42 year old male with history of asthma, GERD, hypothyroidism, aspergilloma, left upper lobe segmentectomy for aspergilloma/MAI, bullous lung disease presented on 09/04/2018 with shortness of breath and was found to have tension pneumothorax.  CVTS was consulted, right-sided chest tube was placed urgently.  Pneumothorax resolved with chest tube, this was removed on 09/07/2018.  Patient was found to have worsening dyspnea on 09/08/2018 and repeat chest x-ray showed recurrent tension pneumothorax which required another urgent chest tube on 09/08/2018.  He underwent  VATS/bleb resection, pleural abrasion on 09/12/2018 by CVTS. -With persistent air leak, s/p intrabronchial valves 11/1   Assessment & Plan:   Recurrent tension pneumothorax -Chest tube -09/04/2018 by Dr. Roxan Hockey, subsequently removed on 09/07/2018 ; repeat chest x-ray was stable without pneumothorax -On 10/21 developed spontaneous tension PTX again -underwent chest tube placement again  -Then had VATS, bleb resection, pleural abrasion on 10/25 -Had persistent air leak following this -Underwent intrabronchial valves on 11/1 -Per CVTS  History of MAI -Continue home regimen of ethambutol and azithromycin.   -Outpatient follow-up by infectious disease  History of aspergilloma -History of remote left upper lobe segmentectomy -Continue antifungal Cresemba, prednisone 5 mg daily -Outpatient follow-up with pulmonary and ID  History of severe persistent asthma -Stable.  Continue bronchodilators  Hypothyroidism -Continue Synthroid  Iron deficiency anemia -Hemoglobin stable.  Continue home iron supplement  Severe malnutrition -Follow nutrition recommendations  DVT prophylaxis: Heparin Code Status: Full Family Communication:  None at bedside Disposition Plan: Home once cleared by CVTS  Consultants: CVTS  Procedures: Chest tube insertion x2, VATS  Antimicrobials:   Ethambutol/Zithromax/Cresemba  Subjective: -complains of swelling the right side of his face neck and upper chest  Objective: Vitals:   09/20/18 0600 09/20/18 0820 09/20/18 0833 09/20/18 0908  BP: 113/83 105/82    Pulse:  73    Resp: (!) 22 (!) 30 (!) 24   Temp: 98.2 F (36.8 C) 98.1 F (36.7 C)    TempSrc: Oral Oral    SpO2:  94% 94% 97%  Weight:      Height:        Intake/Output Summary (Last 24 hours) at 09/20/2018 1128 Last data filed at 09/20/2018 0800 Gross per 24 hour  Intake 1420.6 ml  Output 1782 ml  Net -361.4 ml   Filed Weights   09/04/18 1330 09/19/18 0913  Weight: 51.3 kg 51.3 kg    Examination:  Gen: Awake, Alert, Oriented X 3, cachectic, chronically ill-appearing male HEENT: Subcutaneous emphysema, swelling of the right face and neck, upper chest Lungs: Poor air movement bilaterally CVS: RRR,No Gallops,Rubs or new Murmurs Abd: soft, Non tender, non distended, BS present Extremities: No Cyanosis, Clubbing or edema Skin: no new rashes  Data Reviewed: I have personally reviewed following labs and imaging studies  CBC: Recent Labs  Lab 09/14/18 0249 09/17/18 0238  WBC 8.9 8.5  NEUTROABS  --  7.0  HGB 12.6* 14.0  HCT 38.5* 41.7  MCV 94.6 93.3  PLT 307 448   Basic Metabolic Panel: Recent Labs  Lab 09/14/18 0249 09/17/18 0238  NA 138 138  K 3.9 3.9  CL 98 100  CO2 30 29  GLUCOSE 99 102*  BUN 13 13  CREATININE 0.83 0.66  CALCIUM 8.8* 8.6*  MG  --  2.1  GFR: Estimated Creatinine Clearance: 85.5 mL/min (by C-G formula based on SCr of 0.66 mg/dL). Liver Function Tests: Recent Labs  Lab 09/14/18 0249  AST 21  ALT 20  ALKPHOS 76  BILITOT 0.5  PROT 5.5*  ALBUMIN 2.3*   No results for input(s): LIPASE, AMYLASE in the last 168 hours. No results for input(s): AMMONIA in the last 168  hours. Coagulation Profile: No results for input(s): INR, PROTIME in the last 168 hours. Cardiac Enzymes: No results for input(s): CKTOTAL, CKMB, CKMBINDEX, TROPONINI in the last 168 hours. BNP (last 3 results) No results for input(s): PROBNP in the last 8760 hours. HbA1C: No results for input(s): HGBA1C in the last 72 hours. CBG: No results for input(s): GLUCAP in the last 168 hours. Lipid Profile: No results for input(s): CHOL, HDL, LDLCALC, TRIG, CHOLHDL, LDLDIRECT in the last 72 hours. Thyroid Function Tests: No results for input(s): TSH, T4TOTAL, FREET4, T3FREE, THYROIDAB in the last 72 hours. Anemia Panel: No results for input(s): VITAMINB12, FOLATE, FERRITIN, TIBC, IRON, RETICCTPCT in the last 72 hours. Sepsis Labs: No results for input(s): PROCALCITON, LATICACIDVEN in the last 168 hours.  No results found for this or any previous visit (from the past 240 hour(s)).       Radiology Studies: Dg Chest Port 1 View  Result Date: 09/20/2018 CLINICAL DATA:  Spontaneous pneumothorax EXAM: PORTABLE CHEST 1 VIEW COMPARISON:  Portable exam 0805 hours compared to 09/18/2018 FINDINGS: RIGHT thoracostomy tube unchanged. Normal heart size and mediastinal contours. Atherosclerotic calcification aorta. Pneumomediastinum present. Postsurgical changes at RIGHT hilum/RIGHT hemithorax. Emphysematous changes with bullous disease at the upper lobes. Persistent chronic infiltrates in the mid lungs bilaterally. No pleural effusion. Persistent RIGHT basilar pneumothorax. Extensive chest wall emphysema bilaterally extending into cervical regions. IMPRESSION: Changes of COPD with bullous disease at the upper lobes and persistent mid lung infiltrates. Persistent RIGHT basilar pneumothorax. Electronically Signed   By: Lavonia Dana M.D.   On: 09/20/2018 10:20        Scheduled Meds: . azithromycin  500 mg Oral Daily  . bisacodyl  10 mg Oral Daily  . enoxaparin (LOVENOX) injection  40 mg Subcutaneous  Q24H  . ethambutol  1,000 mg Oral Daily  . feeding supplement (ENSURE ENLIVE)  237 mL Per Tube TID BM  . fluticasone furoate-vilanterol  1 puff Inhalation Daily  . HYDROmorphone   Intravenous Q4H  . Isavuconazonium Sulfate  372 mg Oral Daily  . levothyroxine  75 mcg Oral QAC breakfast  . multivitamin with minerals  1 tablet Oral Daily  . predniSONE  5 mg Oral Q breakfast  . senna-docusate  1 tablet Oral QHS  . umeclidinium bromide  1 puff Inhalation Daily   Continuous Infusions: . lactated ringers 10 mL/hr at 09/19/18 0917  . potassium chloride       LOS: 16 days        Brett Polite, MD [facility] Page via [email or web address]  If 7PM-7AM, please contact night-coverage [email or web address] Password [facility] 09/20/2018, 11:28 AM

## 2018-09-20 NOTE — Progress Notes (Signed)
Patient;s Dilaudid PCA syringe changed and 2.70m wasted from old syringe. Witnessed by JSaunders Revel RN and AErlene Quan RN.

## 2018-09-20 NOTE — Progress Notes (Addendum)
[address]       [place] [address]             [phone]       1 Day Post-Op Procedure(s) (LRB): VIDEO BRONCHOSCOPY (N/A) INSERTION OF INTERBRONCHIAL VALVE (IBV) (N/A)  Subjective: Patient is a side sleeper and slept on his left side. He noticed the sub Q air is worse in right chest and face this am.   Objective: Vital signs in last 24 hours: Temp:  [98 F (36.7 C)-98.3 F (36.8 C)] 98.1 F (36.7 C) (11/02 0820) Pulse Rate:  [73-103] 73 (11/02 0820) Cardiac Rhythm: Normal sinus rhythm (11/02 0600) Resp:  [13-30] 24 (11/02 0833) BP: (88-135)/(54-92) 105/82 (11/02 0820) SpO2:  [94 %-99 %] 97 % (11/02 0908)     Intake/Output from previous day: 11/01 0701 - 11/02 0700 In: 1620.6 [P.O.:1120; I.V.:500.6] Out: 1537 [Urine:1475; Blood:2; Chest Tube:60]   Physical Exam:  Cardiovascular: Slightly tachy Pulmonary: Coarse bilaterally, some increase in subcutaneous emphysema right chest wall, neck and face;subcutanous emphysema on left similar to previous days Abdomen: Soft, non tender, bowel sounds present. Extremities: No  lower extremity edema. Wounds: Clean and dry.  No erythema or signs of infection. Chest Tube: to suction, ++++ air leak that worsens with cough  Lab Results: CBC: No results for input(s): WBC, HGB, HCT, PLT in the last 72 hours. BMET:  No results for input(s): NA, K, CL, CO2, GLUCOSE, BUN, CREATININE, CALCIUM in the last 72 hours.  PT/INR: No results for input(s): LABPROT, INR in the last 72 hours. ABG:  INR: Will add last result for INR, ABG once components are confirmed Will add last 4 CBG results once components are confirmed  Assessment/Plan:  1. CV - Slightly tachy in low 100's this am. 2.  Pulmonary - Chest tube with 90 cc output last 24 hours. Chest tube to suction. There is a ++++  air leak that worsens with cough. CXR this am appears stable (bilateral extensive subcutaneous emphysema, apical bullous emphysema  bilaterally, and a right basilar ptx). Continue Prednisone and Ellipta. Encourage incentive spirometer.    Donielle M ZimmermanPA-C 09/20/2018,10:44 AM [phone]    Chart reviewed, patient examined, agree with above. CXR stable.  He has some subcutaneous emphysema that is worse on the right side and he says it developed after he slept on the left side, probably kinking the chest tube. Still has 4-5 chamber air leak. Will continue to suction today.

## 2018-09-21 NOTE — Progress Notes (Signed)
Patient ID: [name], male   DOB: [birth year], 42 y.o.   MRN: [id number]  PROGRESS NOTE    MILAM ALLBAUGH  [id number]:[id number] DOB: 1976-03-22 DOA: 09/04/2018 PCP: Wallene Dales, MD   Brief Narrative:  42 year old male with history of asthma, GERD, hypothyroidism, aspergilloma, left upper lobe segmentectomy for aspergilloma/MAI, bullous lung disease presented on 09/04/2018 with shortness of breath and was found to have tension pneumothorax.  CVTS was consulted, right-sided chest tube was placed urgently.  Pneumothorax resolved with chest tube, this was removed on 09/07/2018.  Patient was found to have worsening dyspnea on 09/08/2018 and repeat chest x-ray showed recurrent tension pneumothorax which required another urgent chest tube on 09/08/2018.  He underwent  VATS/bleb resection, pleural abrasion on 09/12/2018 by CVTS. -With persistent air leak, s/p intrabronchial valves 11/1  Assessment & Plan:   Recurrent tension pneumothorax -Chest tube -09/04/2018 by Dr. Roxan Hockey, subsequently removed on 09/07/2018 ; repeat chest x-ray was stable without pneumothorax -On 10/21 developed spontaneous tension PTX again -underwent chest tube placement again  -Then had VATS, bleb resection, pleural abrasion on 10/25 -Had persistent air leak following this -Underwent intrabronchial valves on 11/1 -Per CVTS, chest tube to suction  History of MAI -Continue home regimen of ethambutol and azithromycin.   -Outpatient follow-up by infectious disease  History of aspergilloma -History of remote left upper lobe segmentectomy -Continue antifungal Cresemba, prednisone 5 mg daily -Outpatient follow-up with pulmonary and ID  History of severe persistent asthma -Stable.  Continue bronchodilators  Hypothyroidism -Continue Synthroid  Iron deficiency anemia -Hemoglobin stable.  Continue home iron supplement  Severe malnutrition -Follow nutrition recommendations  DVT prophylaxis: Heparin Code Status:  Full Family Communication: None at bedside Disposition Plan: Home once cleared by CVTS  Consultants: CVTS  Procedures: Chest tube insertion x2, VATS  Antimicrobials:   Ethambutol/Zithromax/Cresemba  Subjective: -complains of feeling the same, no changes in breathing  Objective: Vitals:   09/21/18 0816 09/21/18 0818 09/21/18 1214 09/21/18 1215  BP: 108/76     Pulse:      Resp: (!) 23 (!) 24  (!) 24  Temp: 97.8 F (36.6 C)  98.3 F (36.8 C)   TempSrc: Oral  Oral   SpO2: 100% 100%  92%  Weight:      Height:        Intake/Output Summary (Last 24 hours) at 09/21/2018 1220 Last data filed at 09/21/2018 1019 Gross per 24 hour  Intake 1354 ml  Output 1156 ml  Net 198 ml   Filed Weights   09/04/18 1330 09/19/18 0913  Weight: 51.3 kg 51.3 kg    Examination:  Gen: Awake, Alert, Oriented X 3, cachectic, chronically ill appearing HEENT: subcutaneous emphysema, swelling of R face, neck and chest Lungs: Good air movement bilaterally, CTAB CVS: RRR,No Gallops,Rubs or new Murmurs Abd: soft, Non tender, non distended, BS present Extremities: No Cyanosis, Clubbing or edema Skin: no new rashes  Data Reviewed: I have personally reviewed following labs and imaging studies  CBC: Recent Labs  Lab 09/17/18 0238  WBC 8.5  NEUTROABS 7.0  HGB 14.0  HCT 41.7  MCV 93.3  PLT 915   Basic Metabolic Panel: Recent Labs  Lab 09/17/18 0238  NA 138  K 3.9  CL 100  CO2 29  GLUCOSE 102*  BUN 13  CREATININE 0.66  CALCIUM 8.6*  MG 2.1   GFR: Estimated Creatinine Clearance: 85.5 mL/min (by C-G formula based on SCr of 0.66 mg/dL). Liver Function Tests: No results for input(s): AST, ALT,  ALKPHOS, BILITOT, PROT, ALBUMIN in the last 168 hours. No results for input(s): LIPASE, AMYLASE in the last 168 hours. No results for input(s): AMMONIA in the last 168 hours. Coagulation Profile: No results for input(s): INR, PROTIME in the last 168 hours. Cardiac Enzymes: No results for  input(s): CKTOTAL, CKMB, CKMBINDEX, TROPONINI in the last 168 hours. BNP (last 3 results) No results for input(s): PROBNP in the last 8760 hours. HbA1C: No results for input(s): HGBA1C in the last 72 hours. CBG: No results for input(s): GLUCAP in the last 168 hours. Lipid Profile: No results for input(s): CHOL, HDL, LDLCALC, TRIG, CHOLHDL, LDLDIRECT in the last 72 hours. Thyroid Function Tests: No results for input(s): TSH, T4TOTAL, FREET4, T3FREE, THYROIDAB in the last 72 hours. Anemia Panel: No results for input(s): VITAMINB12, FOLATE, FERRITIN, TIBC, IRON, RETICCTPCT in the last 72 hours. Sepsis Labs: No results for input(s): PROCALCITON, LATICACIDVEN in the last 168 hours.  No results found for this or any previous visit (from the past 240 hour(s)).       Radiology Studies: Dg Chest Port 1 View  Result Date: 09/20/2018 CLINICAL DATA:  Spontaneous pneumothorax EXAM: PORTABLE CHEST 1 VIEW COMPARISON:  Portable exam 0805 hours compared to 09/18/2018 FINDINGS: RIGHT thoracostomy tube unchanged. Normal heart size and mediastinal contours. Atherosclerotic calcification aorta. Pneumomediastinum present. Postsurgical changes at RIGHT hilum/RIGHT hemithorax. Emphysematous changes with bullous disease at the upper lobes. Persistent chronic infiltrates in the mid lungs bilaterally. No pleural effusion. Persistent RIGHT basilar pneumothorax. Extensive chest wall emphysema bilaterally extending into cervical regions. IMPRESSION: Changes of COPD with bullous disease at the upper lobes and persistent mid lung infiltrates. Persistent RIGHT basilar pneumothorax. Electronically Signed   By: Lavonia Dana M.D.   On: 09/20/2018 10:20        Scheduled Meds: . azithromycin  500 mg Oral Daily  . bisacodyl  10 mg Oral Daily  . enoxaparin (LOVENOX) injection  40 mg Subcutaneous Q24H  . ethambutol  1,000 mg Oral Daily  . feeding supplement (ENSURE ENLIVE)  237 mL Per Tube TID BM  . fluticasone  furoate-vilanterol  1 puff Inhalation Daily  . HYDROmorphone   Intravenous Q4H  . Isavuconazonium Sulfate  372 mg Oral Daily  . levothyroxine  75 mcg Oral QAC breakfast  . multivitamin with minerals  1 tablet Oral Daily  . predniSONE  5 mg Oral Q breakfast  . senna-docusate  1 tablet Oral QHS  . umeclidinium bromide  1 puff Inhalation Daily   Continuous Infusions: . lactated ringers 10 mL/hr at 09/19/18 0917  . potassium chloride       LOS: 17 days        Domenic Polite, MD [facility] Page via [email or web address]  If 7PM-7AM, please contact night-coverage [email or web address] Password [email or web address] 09/21/2018, 12:20 PM

## 2018-09-21 NOTE — Progress Notes (Addendum)
[address]       [place] [address]             [phone]       2 Days Post-Op Procedure(s) (LRB): VIDEO BRONCHOSCOPY (N/A) INSERTION OF INTERBRONCHIAL VALVE (IBV) (N/A)  Subjective: Patient's states harder to take breaths   Objective: Vital signs in last 24 hours: Temp:  [97.7 F (36.5 C)-98.2 F (36.8 C)] 97.8 F (36.6 C) (11/03 0816) Pulse Rate:  [96-99] 99 (11/02 1637) Cardiac Rhythm: Normal sinus rhythm (11/03 0700) Resp:  [13-36] 24 (11/03 0818) BP: (99-118)/(73-88) 108/76 (11/03 0816) SpO2:  [98 %-100 %] 100 % (11/03 0818)     Intake/Output from previous day: 11/02 0701 - 11/03 0700 In: 8250 [P.O.:1314; I.V.:280] Out: 927 [Urine:625; Chest Tube:302]   Physical Exam:  Cardiovascular: RRR Pulmonary: Coarse bilaterally, stable subcutaneous emphysema right chest wall, neck and face;subcutanous emphysema on left similar to previous days Abdomen: Soft, non tender, bowel sounds present. Extremities: No  lower extremity edema. Wounds: Clean and dry.  No erythema or signs of infection. Chest Tube: to suction, +1 air leak   Lab Results: CBC: No results for input(s): WBC, HGB, HCT, PLT in the last 72 hours. BMET:  No results for input(s): NA, K, CL, CO2, GLUCOSE, BUN, CREATININE, CALCIUM in the last 72 hours.  PT/INR: No results for input(s): LABPROT, INR in the last 72 hours. ABG:  INR: Will add last result for INR, ABG once components are confirmed Will add last 4 CBG results once components are confirmed  Assessment/Plan:  1. CV - Slightly tachy in low 100's this am. 2.  Pulmonary - Chest tube with 34 cc output last 24 hours. Chest tube to suction. There is a +1  air leak (improved from previous +5 air leak). On oxygen 3 L via [place]. Wean as able.  Continue as is. Check CXR in am. Continue Prednisone and Ellipta. Encourage incentive spirometer.    Donielle M ZimmermanPA-C 09/21/2018,11:05 AM [phone]   Chart reviewed, patient  examined, agree with above. Air leak much improved today. Will decrease suction to 10 cm.

## 2018-09-22 ENCOUNTER — Inpatient Hospital Stay (HOSPITAL_COMMUNITY): Payer: BLUE CROSS/BLUE SHIELD

## 2018-09-22 ENCOUNTER — Encounter (HOSPITAL_COMMUNITY): Payer: Self-pay | Admitting: Thoracic Surgery (Cardiothoracic Vascular Surgery)

## 2018-09-22 NOTE — Progress Notes (Addendum)
[address]       [place] [address]             [phone]      3 Days Post-Op Procedure(s) (LRB): VIDEO BRONCHOSCOPY (N/A) INSERTION OF INTERBRONCHIAL VALVE (IBV) (N/A) Subjective: Feels a little discouraged this morning  Objective: Vital signs in last 24 hours: Temp:  [97.2 F (36.2 C)-98.3 F (36.8 C)] 97.2 F (36.2 C) (11/04 0746) Pulse Rate:  [66-89] 72 (11/04 0746) Cardiac Rhythm: Normal sinus rhythm (11/04 0746) Resp:  [13-26] 23 (11/04 0746) BP: (100-112)/(73-85) 101/73 (11/04 0746) SpO2:  [92 %-100 %] 98 % (11/04 0746)     Intake/Output from previous day: 11/03 0701 - 11/04 0700 In: 1634 [P.O.:1634] Out: 1239 [Urine:1045; Chest Tube:194] Intake/Output this shift: Total I/O In: -  Out: 250 [Urine:250]  General appearance: alert, cooperative and no distress Heart: regular rate and rhythm, S1, S2 normal, no murmur, click, rub or gallop Lungs: clear to auscultation bilaterally Abdomen: soft, non-tender; bowel sounds normal; no masses,  no organomegaly Extremities: extremities normal, atraumatic, no cyanosis or edema Wound: clean and dry  Lab Results: No results for input(s): WBC, HGB, HCT, PLT in the last 72 hours. BMET: No results for input(s): NA, K, CL, CO2, GLUCOSE, BUN, CREATININE, CALCIUM in the last 72 hours.  PT/INR: No results for input(s): LABPROT, INR in the last 72 hours. ABG    Component Value Date/Time   PHART 7.427 09/13/2018 0445   HCO3 28.4 (H) 09/13/2018 0445   TCO2 26 10/13/2015 0420   ACIDBASEDEF 2.0 12/22/2010 1522   O2SAT 99.2 09/13/2018 0445   CBG (last 3)  No results for input(s): GLUCAP in the last 72 hours.  Assessment/Plan: S/P Procedure(s) (LRB): VIDEO BRONCHOSCOPY (N/A) INSERTION OF INTERBRONCHIAL VALVE (IBV) (N/A)  1. CV-NSR in the 4s. BP low at times.  2. Pulm-tolerating 2L [place] with good oxygen saturation, chest tube in place with 10cm suction. CXR this morning stable with right persistent  basilar pneumothorax.  3. Renal-creatinine 0.66, electrolytes okay 4. H and H 14.0/41.7, stable  Plan: Continue chest tube on 10cm suction. Ambulate. Encouraged use of spirometer. Work on oral intake. Pain is well controlled.    LOS: 18 days    [name] 09/22/2018 Patient seen and examined, agree with above Has a small basilar space, keep on 10 cm of suction Air leak has improved dramatically from prior to valve placement  Remo Lipps C. Roxan Hockey, MD [facility] [phone]

## 2018-09-22 NOTE — Progress Notes (Signed)
Physical Therapy Treatment Patient Details Name: [name] MRN: [id number] DOB: 07-Sep-1976 Today's Date: 09/22/2018    History of Present Illness 42 y.o.malewith medical history significant ofasthma, GERD, hypothyroidism, aspergilloma,left upper lobe segmentectomy for aspergilloma/MAI,lung bullous lung disease,who presented with shortness of breath. Dx of tension pneumothorax, chest tube placed urgently.  VATS 09/12/18.     PT Comments    Patient progressing well with mobility. Tolerated ambulation on 3 liters and 1 liter with VSS. Modest DOE on 1 liter. Utilizing RW for mobility at this time. Current POC remains appropriate.   Follow Up Recommendations  Home health PT     Equipment Recommendations  None recommended by PT    Recommendations for Other Services       Precautions / Restrictions Precautions Precautions: Other (comment) Precaution Comments: chest tube Restrictions Weight Bearing Restrictions: No Other Position/Activity Restrictions: R chest tube with continuous suction    Mobility  Bed Mobility Overal bed mobility: Modified Independent                Transfers Overall transfer level: Needs assistance Equipment used: None Transfers: Sit to/from Stand Sit to Stand: Supervision Stand pivot transfers: Supervision       General transfer comment: supervision due to multi lines  Ambulation/Gait Ambulation/Gait assistance: Supervision Gait Distance (Feet): 410 Feet Assistive device: Rolling walker (2 wheeled) Gait Pattern/deviations: Step-through pattern;Decreased stride length Gait velocity: decreased Gait velocity interpretation: 1.31 - 2.62 ft/sec, indicative of limited community ambulator General Gait Details: patient ambulated 310 on 3 liters and 100 on 1 liter   Stairs             Wheelchair Mobility    Modified Rankin (Stroke Patients Only)       Balance Overall balance assessment: Mild deficits observed, not formally  tested Sitting-balance support: No upper extremity supported;Feet unsupported Sitting balance-Leahy Scale: Normal     Standing balance support: No upper extremity supported;During functional activity Standing balance-Leahy Scale: Good                              Cognition Arousal/Alertness: Awake/alert Behavior During Therapy: WFL for tasks assessed/performed Overall Cognitive Status: Within Functional Limits for tasks assessed                                        Exercises      General Comments General comments (skin integrity, edema, etc.): VSS thorughout session      Pertinent Vitals/Pain Pain Assessment: Faces Faces Pain Scale: Hurts little more Pain Location: when coughing Pain Descriptors / Indicators: Sore Pain Intervention(s): Monitored during session    Home Living                      Prior Function            PT Goals (current goals can now be found in the care plan section) Acute Rehab PT Goals Patient 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 : None Difficulty sitting down on and standing up from a chair with arms (e.g., wheelchair, bedside commode, etc,.)?: None Help needed moving to and from a bed to chair (including a wheelchair)?: A Little Help needed walking in hospital room?: A Little Help needed climbing 3-5 steps with a railing? : A Little 6 Click Score: 21    End of Session Equipment Utilized During Treatment:  Oxygen Activity Tolerance: Patient tolerated treatment well Patient left: in chair;with call bell/phone within reach Nurse Communication: Mobility status PT Visit Diagnosis: Difficulty in walking, not elsewhere classified (R26.2)     Time: 7276-1848 PT Time Calculation (min) (ACUTE ONLY): 19 min  Charges:  $Gait Training: 8-22 mins                     Alben Deeds, PT DPT  Board Certified Neurologic Specialist [place] Pager [phone] Office [place] 09/22/2018, 3:00 PM

## 2018-09-22 NOTE — Progress Notes (Signed)
-  no events overnight, improvement in Air leak, remains on chest tube to suction -per CVTS  Domenic Polite, MD

## 2018-09-23 ENCOUNTER — Inpatient Hospital Stay (HOSPITAL_COMMUNITY): Payer: BLUE CROSS/BLUE SHIELD

## 2018-09-23 NOTE — Progress Notes (Signed)
Pt placed on water seal by MD around 0800. Pt began to have increased SQ air and high pitched voice. Pts o2 sats WNL and no SOB, but placed backed to -20cm suction. Md notified. Will continue to monitor.

## 2018-09-23 NOTE — Progress Notes (Signed)
Patient ID: [name], male   DOB: May 09, 1976, 42 y.o.   MRN: [id number]  PROGRESS NOTE    [name]  PTE:[id number] DOB: [birth year] DOA: 09/04/2018 PCP: Wallene Dales, MD   Brief Narrative:  42 year old male with history of asthma, GERD, hypothyroidism, aspergilloma, left upper lobe segmentectomy for aspergilloma/MAI, bullous lung disease presented on 09/04/2018 with shortness of breath and was found to have tension pneumothorax.  CVTS was consulted, right-sided chest tube was placed urgently.  Pneumothorax resolved with chest tube, this was removed on 09/07/2018.  Patient was found to have worsening dyspnea on 09/08/2018 and repeat chest x-ray showed recurrent tension pneumothorax which required another urgent chest tube on 09/08/2018.  He underwent  VATS/bleb resection, pleural abrasion on 09/12/2018 by CVTS. -With persistent air leak, s/p intrabronchial valves 11/1  Assessment & Plan:   Recurrent tension pneumothorax -Chest tube -09/04/2018 by Dr. Roxan Hockey, subsequently removed on 09/07/2018 ; repeat chest x-ray was stable without pneumothorax -On 10/21 developed spontaneous tension PTX again -underwent chest tube placement again  -Then had VATS, bleb resection, pleural abrasion on 10/25 -Had persistent air leak following this -Underwent intrabronchial valves on 11/1 -Per CVTS, chest tube was put to waterseal this morning, developed subcutaneous emphysema again, back to suction now  History of MAI -Continue home regimen of ethambutol and azithromycin.   -Outpatient follow-up by infectious disease  History of aspergilloma -History of remote left upper lobe segmentectomy -Continue antifungal Cresemba, prednisone 5 mg daily -Outpatient follow-up with pulmonary and ID  History of severe persistent asthma -Stable.  Continue bronchodilators  Hypothyroidism -Continue Synthroid  Iron deficiency anemia -Hemoglobin stable.  Continue home iron supplement  Severe  malnutrition -Follow nutrition recommendations  DVT prophylaxis: Heparin Code Status: Full Family Communication: None at bedside Disposition Plan: Home once cleared by CVTS  Consultants: CVTS  Procedures: Chest tube insertion x2, VATS  Antimicrobials:   Ethambutol/Zithromax/Cresemba  Subjective: -Started having swelling in his chest wall, neck following waterseal  Objective: Vitals:   09/23/18 0800 09/23/18 0900 09/23/18 1100 09/23/18 1120  BP: 105/71   104/82  Pulse: 70 88 74 81  Resp: 19 (!) _0 Temp:    98.1 F (36.7 C)  TempSrc:    Oral  SpO2: 94% 92% 97% 96%  Weight:      Height:        Intake/Output Summary (Last 24 hours) at 09/23/2018 1423 Last data filed at 09/23/2018 1010 Gross per 24 hour  Intake 620 ml  Output 760 ml  Net -140 ml   Filed Weights   09/04/18 1330 09/19/18 0913  Weight: 51.3 kg 51.3 kg    Examination:  Gen: Awake, Alert, Oriented X 3, chronically ill appearing HEENT: PERRLA, subcut emphysema, swelling of R face, neck and chest Lungs: Good air movement bilaterally, CTAB CVS: RRR,No Gallops,Rubs or new Murmurs Abd: soft, Non tender, non distended, BS present Extremities: No Cyanosis, Clubbing or edema Skin: no new rashes  Data Reviewed: I have personally reviewed following labs and imaging studies  CBC: Recent Labs  Lab 09/17/18 0238  WBC 8.5  NEUTROABS 7.0  HGB 14.0  HCT 41.7  MCV 93.3  PLT 437   Basic Metabolic Panel: Recent Labs  Lab 09/17/18 0238  NA 138  K 3.9  CL 100  CO2 29  GLUCOSE 102*  BUN 13  CREATININE 0.66  CALCIUM 8.6*  MG 2.1   GFR: Estimated Creatinine Clearance: 85.5 mL/min (by C-G formula based on SCr of 0.66 mg/dL). Liver  Function Tests: No results for input(s): AST, ALT, ALKPHOS, BILITOT, PROT, ALBUMIN in the last 168 hours. No results for input(s): LIPASE, AMYLASE in the last 168 hours. No results for input(s): AMMONIA in the last 168 hours. Coagulation Profile: No results for  input(s): INR, PROTIME in the last 168 hours. Cardiac Enzymes: No results for input(s): CKTOTAL, CKMB, CKMBINDEX, TROPONINI in the last 168 hours. BNP (last 3 results) No results for input(s): PROBNP in the last 8760 hours. HbA1C: No results for input(s): HGBA1C in the last 72 hours. CBG: No results for input(s): GLUCAP in the last 168 hours. Lipid Profile: No results for input(s): CHOL, HDL, LDLCALC, TRIG, CHOLHDL, LDLDIRECT in the last 72 hours. Thyroid Function Tests: No results for input(s): TSH, T4TOTAL, FREET4, T3FREE, THYROIDAB in the last 72 hours. Anemia Panel: No results for input(s): VITAMINB12, FOLATE, FERRITIN, TIBC, IRON, RETICCTPCT in the last 72 hours. Sepsis Labs: No results for input(s): PROCALCITON, LATICACIDVEN in the last 168 hours.  No results found for this or any previous visit (from the past 240 hour(s)).       Radiology Studies: Dg Chest Port 1 View  Result Date: 09/23/2018 CLINICAL DATA:  Follow-up pneumothorax. EXAM: PORTABLE CHEST 1 VIEW COMPARISON:  09/22/2018 and prior exams FINDINGS: Cardiomediastinal silhouette is unchanged. RIGHT thoracostomy tube and RIGHT endobronchial valves again noted with unchanged RIGHT basilar pneumothorax and diffuse bilateral subcutaneous emphysema. Severe bullous changes in the UPPER lungs again noted. Scattered atelectasis/scarring within the lungs again noted. Surgical sutures overlying the LEFT LOWER lung noted. IMPRESSION: Unchanged appearance of the chest with RIGHT thoracostomy tube, RIGHT basilar pneumothorax and bilateral subcutaneous emphysema. Electronically Signed   By: Margarette Canada M.D.   On: 09/23/2018 08:05   Dg Chest Port 1 View  Result Date: 09/22/2018 CLINICAL DATA:  Pneumothorax. EXAM: PORTABLE CHEST 1 VIEW COMPARISON:  Radiograph of September 20, 2018. FINDINGS: Stable cardiomediastinal silhouette. Stable emphysematous and bullous disease is seen in both upper lobes. Right-sided chest tube is unchanged in  position. Stable persistent right basilar pneumothorax is noted. Stable subcutaneous emphysema is seen in bilateral supraclavicular and lateral chest wall regions. Bony thorax is unremarkable. IMPRESSION: Stable position of right-sided chest tube. Stable persistent right basilar pneumothorax. Stable emphysematous disease seen in both upper lobes. Electronically Signed   By: Marijo Conception, M.D.   On: 09/22/2018 08:07        Scheduled Meds: . azithromycin  500 mg Oral Daily  . bisacodyl  10 mg Oral Daily  . enoxaparin (LOVENOX) injection  40 mg Subcutaneous Q24H  . ethambutol  1,000 mg Oral Daily  . feeding supplement (ENSURE ENLIVE)  237 mL Per Tube TID BM  . fluticasone furoate-vilanterol  1 puff Inhalation Daily  . HYDROmorphone   Intravenous Q4H  . Isavuconazonium Sulfate  372 mg Oral Daily  . levothyroxine  75 mcg Oral QAC breakfast  . multivitamin with minerals  1 tablet Oral Daily  . predniSONE  5 mg Oral Q breakfast  . senna-docusate  1 tablet Oral QHS  . umeclidinium bromide  1 puff Inhalation Daily   Continuous Infusions: . lactated ringers 10 mL/hr at 09/22/18 2300  . potassium chloride       LOS: 19 days        Domenic Polite, MD [facility] Page via [email or web address]  If 7PM-7AM, please contact night-coverage [email or web address] Password [email or web address] 09/23/2018, 2:23 PM

## 2018-09-23 NOTE — Progress Notes (Addendum)
[address]       [place] [address]             [phone]      4 Days Post-Op Procedure(s) (LRB): VIDEO BRONCHOSCOPY (N/A) INSERTION OF INTERBRONCHIAL VALVE (IBV) (N/A) Subjective: Feels okay this morning. Had some diarrhea yesterday but today has been better now that he is off the stool softeners.  Objective: Vital signs in last 24 hours: Temp:  [97.2 F (36.2 C)-98.5 F (36.9 C)] 97.7 F (36.5 C) (11/05 0400) Pulse Rate:  [61-78] 69 (11/05 0600) Cardiac Rhythm: Normal sinus rhythm (11/05 0700) Resp:  [13-30] 18 (11/05 0600) BP: (97-113)/(66-79) 98/70 (11/05 0400) SpO2:  [94 %-100 %] 94 % (11/05 0600)     Intake/Output from previous day: 11/04 0701 - 11/05 0700 In: 1100 [P.O.:960; I.V.:140] Out: 710 [Urine:550; Chest Tube:160] Intake/Output this shift: No intake/output data recorded.  General appearance: alert, cooperative and no distress Heart: regular rate and rhythm, S1, S2 normal, no murmur, click, rub or gallop Lungs: clear to auscultation bilaterally and crackles bilaterally, subq emphysema Abdomen: soft, non-tender; bowel sounds normal; no masses,  no organomegaly Extremities: extremities normal, atraumatic, no cyanosis or edema Wound: clean and dry  Lab Results: No results for input(s): WBC, HGB, HCT, PLT in the last 72 hours. BMET: No results for input(s): NA, K, CL, CO2, GLUCOSE, BUN, CREATININE, CALCIUM in the last 72 hours.  PT/INR: No results for input(s): LABPROT, INR in the last 72 hours. ABG    Component Value Date/Time   PHART 7.427 09/13/2018 0445   HCO3 28.4 (H) 09/13/2018 0445   TCO2 26 10/13/2015 0420   ACIDBASEDEF 2.0 12/22/2010 1522   O2SAT 99.2 09/13/2018 0445   CBG (last 3)  No results for input(s): GLUCAP in the last 72 hours.  Assessment/Plan: S/P Procedure(s) (LRB): VIDEO BRONCHOSCOPY (N/A) INSERTION OF INTERBRONCHIAL VALVE (IBV) (N/A)  1. CV-NSR in the 22s. BP low at times.  2. Pulm-tolerating  room air with good oxygen saturation, chest tube in place with 10cm suction. CXR this morning stable with subcutaneous emphysema seen in bilateral supraclavicular and lateral chest wall areas.   3. Renal-creatinine 0.66, electrolytes okay 4. H and H 14.0/41.7, stable  Plan: Air leak improved but still present. Ambulate. Off oxygen this morning with good oxygen saturation. Continue incentive spirometer use hourly.    LOS: 19 days    [name] 09/23/2018 Patient seen and examined, agree with above Now that air leak is decreased will try on water seal again  [place] C. Roxan Hockey, MD [facility] [phone]

## 2018-09-23 NOTE — Progress Notes (Signed)
Nutrition Follow-up  DOCUMENTATION CODES:   Severe malnutrition in context of chronic illness, Underweight  INTERVENTION:   -Continue Ensure Enlive po TID, each supplement provides 350 kcal and 20 grams of protein  NUTRITION DIAGNOSIS:   Severe Malnutrition related to chronic illness(bullous lung disease ) as evidenced by severe fat depletion, severe muscle depletion.  Ongoing  GOAL:   Patient will meet greater than or equal to 90% of their needs  Progressing  MONITOR:   PO intake, Supplement acceptance, Labs, Skin, Weight trends, I & O's  REASON FOR ASSESSMENT:   Malnutrition Screening Tool    ASSESSMENT:   42 yo Male with PMH of MAI, bullous lung disease, left upper lobe segmentectomy for aspergilloma, reflux and hypothyroidism. He presented to [facility] today after noting onset of shortness of breath.  10/25- s/p Procedure(s): VIDEO ASSISTED THORACOSCOPY (Right), STAPLING OF BLEBS (Right) 11/1- s/p Procedure(s): VIDEO BRONCHOSCOPY (N/A), INSERTION OF INTERBRONCHIAL VALVE (IBV) (N/A)  Chest tube: 160 ml output x 24 hours  Reviwed I/O's: +390 ml x 24 hours and -396 ml since 09/09/18  Per CVTS notes, plan to transition chest tube to water seal today.   Pt sitting in recliner chair, in good spirits today. Pt shares that appetite is "off and on"; he reports that over the past 2 days he has been experiencing diarrhea "because I've been taking too many stool softeners". He reports that he has not been eating as much or drinking as much during this time frame in hopes that diarrhea will not exacerbate. Pt reports prior to this, he was consistently consuming 3 Ensure supplements daily.   Noted meal completion 60-100%. Lunch was just delivered prior to visit; encouraged pt to consume what he can. Offered to change to a different supplement on formulary, but pt would like to stick with Ensure.   Labs reviewed.   Diet Order:   Diet Order            DIET SOFT Room service  appropriate? Yes; Fluid consistency: Thin  Diet effective now              EDUCATION NEEDS:   No education needs have been identified at this time  Skin:  Skin Assessment: Skin Integrity Issues: Skin Integrity Issues:: Incisions Incisions: closed rt chest, chest tube  Last BM:  09/22/18  Height:   Ht Readings from Last 1 Encounters:  09/19/18 5' 7.01" (1.702 m)    Weight:   Wt Readings from Last 1 Encounters:  09/19/18 51.3 kg    Ideal Body Weight:  67.3 kg  BMI:  Body mass index is 17.69 kg/m.  Estimated Nutritional Needs:   Kcal:  1700-1900  Protein:  80-95 gm  Fluid:  1.7-1.9 L    [place] A. Jimmye Norman, RD, LDN, CDE Pager: [phone] After hours Pager: [phone]

## 2018-09-23 NOTE — Discharge Summary (Addendum)
Physician Discharge Summary  Patient ID: KNOX CERVI MRN: [id number] DOB/AGE: Dec 28, 1975 42 y.o.  Admit date: 09/04/2018 Discharge date: 10/07/2018  Admission Diagnoses: Patient Active Problem List   Diagnosis Date Noted  . Protein-calorie malnutrition, severe 09/05/2018  . Tension pneumothorax 09/04/2018  . 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 Drug rash 12/16/2017  . 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 GERD (gastroesophageal reflux disease) 08/09/2017  . Hypothyroidism 08/09/2017  . Iron deficiency anemia 08/09/2017  . Weight loss 10/15/2016  . Severe persistent asthma 01/16/2016  . Mold exposure 12/05/2015  . 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 Hemoptysis   . 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 Solar lentigo 08/08/2015  . Dyspnea 05/24/2014  . Osteoporosis, unspecified 03/10/2013  . Osteoporosis 09/22/2012  . Lung disease, chronic obstructive ([place]) 01/30/2012  . Mycobacterium avium infection ([place]) 06/27/2011  . 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[place]) 03/27/2011  . 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 [name] is a 42 yo man with a past history of MAI, bullous lung disease, left upper lobe segmentectomy for aspergilloma, reflux and hypothyroidism. He presented to [facility], [place] today after noting onset of shortness of breath this morning. Sudden onset after coughing. Found to have a right spontaneous pneumothorax. CT placed by Dr. Dolly Rias in ED. Transferred to [facility] for further management.  Currently some discomfort at tube site, but shortness of breath  resolved  Hospital Course:   We assisted with chest tube management but when the chest tube was removed on 10/21 Mr. Kowalski developed another spontaneous tension pneumothorax. The patient subsequently underwent a VATs with stapling of blebs on 09/12/2018 with Dr. Roxan Hockey. He tolerated the procedure well and was transferred to the ICU. We attempted to place the patient on water seal but this created an increase in subcutaneous emphysema,  therefore we had to place the patient back on suction. Since there was a continued air leak, the patient underwent intrabronchial valve placement on 09/19/2018. On 11/3 we were able to decrease the suction to 10cm. We continued to monitor the patient. On 09/23/2018 we switched the chest tube to water seal. The CXR in the morning showedNo evident pneumothorax. Extensive bullous disease in the upper lobes with scarring. Postoperative changes bilaterally. Small Loculated pleural effusion on the right. Extensive subcutaneous air. Appearance essentially stable compared to 2 days prior. He was deemed ready for discharge today.   .   Consults: physicial therapy  Significant Diagnostic Studies:   CLINICAL DATA:  Follow-up pneumothorax.  EXAM: PORTABLE CHEST 1 VIEW  COMPARISON:  09/22/2018 and prior exams  FINDINGS: Cardiomediastinal silhouette is unchanged.  RIGHT thoracostomy tube and RIGHT endobronchial valves again noted with unchanged RIGHT basilar pneumothorax and diffuse bilateral subcutaneous emphysema.  Severe bullous changes in the UPPER lungs again noted.  Scattered atelectasis/scarring within the lungs again noted. Surgical sutures overlying the LEFT LOWER lung noted.  IMPRESSION: Unchanged appearance of the chest with RIGHT thoracostomy tube, RIGHT basilar pneumothorax and bilateral subcutaneous emphysema.   Electronically Signed   By: Margarette Canada M.D.   On: 09/23/2018 08:05  Treatments:   NAME: YVES, FODOR MEDICAL RECORD  [id number] ACCOUNT [id number] DATE OF BIRTH:October 22, 1976  [facility]ACILITY[facility] LOCATION: [facility] PHYSICIAN:STEVEN Chaya Jan, MD  OPERATIVE REPORT  DATE OF PROCEDURE:  09/12/2018  PREOPERATIVE DIAGNOSIS:  Right spontaneous pneumothorax.  POSTOPERATIVE DIAGNOSIS:  Right spontaneous pneumothorax.  PROCEDURE:   Right video-assisted thoracoscopy,  Multiple bleb resections, Pleural abrasion and Intercostal nerve blocks.  SURGEON:  Modesto Charon, MD  ASSISTANT:  Olin Pia, RNFA.  ANESTHESIA:  General.  FINDINGS:  Multiple large blebs in the lower and middle lobes.  Upper lobe densely adherent to apical chest wall.  NAME: BROGAN, MARTIS MEDICAL RECORD [id number] ACCOUNT [id number] DATE OF BIRTH:21-Apr-1976 FACILITY: [facility] LOCATION: [facility] PHYSICIAN:STEVEN Chaya Jan, MD  OPERATIVE REPORT  DATE OF PROCEDURE:  09/19/2018  PREOPERATIVE DIAGNOSIS:  Prolonged air leak after bleb resection.  POSTOPERATIVE DIAGNOSIS:  Prolonged air leak after bleb resection.  PROCEDURE:  Bronchoscopy and intrabronchial valve replacement.  SURGEON:  Modesto Charon, MD  ASSISTANT:  None.  ANESTHESIA:  General.  FINDINGS:  Air leak, markedly decreased with an obstruction of lower lobe bronchus.  No change with obstruction of the upper lobe or middle lobe bronchus, markedly decreased air leak post procedure   Discharge Exam: Blood pressure 115/79, pulse 89, temperature 98 F (36.7 C), resp. rate (!) 26, height 5' 7.01" (1.702 m), weight 51.3 kg, SpO2 94 %.    General appearance: alert, cooperative and no distress Lungs: clear to auscultation bilaterally Wound: clean and dry    Disposition: Discharge disposition: 01-Home or Self Care       Discharge Instructions    Diet general   Complete by:  As directed    Discharge instructions   Complete by:  As directed    It is important that you read following instructions as well as go over your  medication list with RN to help you understand your care after this hospitalization.  Discharge Instructions: Please follow-up with PCP in one week  Please request your primary care physician to go over all Hospital Tests and Procedure/Radiological results at the follow up,  Please get all Hospital records sent to your PCP by signing hospital release before you go home.   Do not drive, operating heavy machinery, perform activities at heights, swimming or participation in water activities or provide baby sitting services because while you are on Pain, Sleep and Anxiety Medications; until you have been seen by Primary Care Physician or a Neurologist and advised to do so again. Do not take more than prescribed Pain, Sleep and Anxiety Medications. You were cared for by a hospitalist during your hospital stay. If you have any questions about your discharge medications or the care you received while you were in the hospital after you are discharged, you can call the unit you were admitted to and ask to speak with the hospitalist on call if the hospitalist that took care of you is not available.  Once you are discharged, your primary care physician will handle any further medical issues. Please note that NO REFILLS for any discharge medications will be authorized once you are discharged, as it is imperative that you return to your primary care physician (or establish a relationship with a primary care physician if you do not have one) for your aftercare needs so that they can reassess your need for medications and monitor your lab values. You Must read complete instructions/literature along with all the possible adverse reactions/side effects for all the Medicines you take and that have been prescribed to you. Take any new Medicines after you have completely understood and accept all the  possible adverse reactions/side effects. Wear Seat belts while driving. If you have smoked or chewed Tobacco in the last 2  yrs please stop smoking and/or stop any Recreational drug use.   Discharge patient   Complete by:  As directed    Discharge disposition:  01-Home or Self Care   Discharge patient date:  10/06/2018   Increase activity slowly   Complete by:  As directed      Allergies as of 10/06/2018      Reactions   Clarithromycin Other (See Comments)   Adverse reaction w/ voriconazole UNSPECIFIED REACTION    Rifaximin Other (See Comments)   Adverse reaction w/ voriconazole UNSPECIFIED REACTION       Medication List    TAKE these medications   albuterol 108 (90 Base) MCG/ACT inhaler Commonly known as:  PROVENTIL HFA;VENTOLIN HFA Inhale 2 puffs into the lungs every 6 (six) hours as needed. What changed:  reasons to take this   azithromycin 500 MG tablet Commonly known as:  ZITHROMAX Take 1 tablet (500 mg total) by mouth daily.   Calcium Carbonate-Vitamin D 600-400 MG-UNIT tablet Take by mouth.   EPINEPHrine 0.3 mg/0.3 mL Soaj injection Commonly known as:  EPI-PEN Inject 0.3 mLs (0.3 mg total) into the muscle once.   ethambutol 400 MG tablet Commonly known as:  MYAMBUTOL TAKE TWO AND ONE-HALF TABLETS BY MOUTH ONCE DAILY   feeding supplement (ENSURE ENLIVE) Liqd Take 237 mLs by mouth 3 (three) times daily between meals.   FERRO-BOB 325 (65 FE) MG tablet Generic drug:  ferrous sulfate Take 325 mg by mouth daily.   fluticasone furoate-vilanterol 100-25 MCG/INH Aepb Commonly known as:  BREO ELLIPTA Inhale 1 puff into the lungs daily.   guaiFENesin 600 MG 12 hr tablet Commonly known as:  MUCINEX Take 1 tablet (600 mg total) by mouth 2 (two) times daily.   Isavuconazonium Sulfate 186 MG Caps Take 2 capsules (372 mg total) by mouth daily.   levothyroxine 75 MCG tablet Commonly known as:  SYNTHROID, LEVOTHROID Take 75 mcg by mouth daily.   oxyCODONE-acetaminophen 10-325 MG tablet Commonly known as:  PERCOCET Take 1 tablet by mouth every 4 (four) hours as needed for up to 5 days  for pain.   polyethylene glycol packet Commonly known as:  MIRALAX / GLYCOLAX Take 17 g by mouth daily as needed for mild constipation.   predniSONE 5 MG tablet Commonly known as:  DELTASONE Take 1 tablet (5 mg total) by mouth daily with breakfast. What changed:  Another medication with the same name was removed. Continue taking this medication, and follow the directions you see here.   senna-docusate 8.6-50 MG tablet Commonly known as:  Senokot-S Take 1 tablet by mouth at bedtime.   Tiotropium Bromide Monohydrate 2.5 MCG/ACT Aers Inhale 2 puffs into the lungs daily.      [facility] Follow up.   Why:  oxygen, w/chair Contact information: [address] [place] [address] [phone]        [facility] Follow [facility].   Specialty:  [place] Why:  Home Health Nurse, physical therapy Contact information: [address] [place] [address] [phone]        Melrose Nakayama, MD. Go to.   Specialty:  Cardiothoracic Surgery Why:  PA/LAT CXR to be taken (at [facility] which is in the same building as Dr. Leonarda Salon office on at;Appointment time is at 10/14/2018 at  3:30. Please arrive at [facility] at 3:00 for a chest xray.  Contact information: [place] [address] [phone]        Wallene Dales, MD. Schedule an appointment as soon as possible for a visit in 1 week(s).   Specialty:  Family Medicine Contact information: [address] [place] [address] [phone]        [facility] Follow up.   Contact information: Please call [phone] to request assitance with locating a primary care doctor within [facility] network.  Also, you can also contact your insurance company directly and request their assistance with finding a primary care doctor within the network.          Signed: [name] 10/07/2018, 11:01 AM

## 2018-09-24 ENCOUNTER — Inpatient Hospital Stay (HOSPITAL_COMMUNITY): Payer: BLUE CROSS/BLUE SHIELD

## 2018-09-24 LAB — BASIC METABOLIC PANEL
Anion gap: 6 (ref 5–15)
BUN: 8 mg/dL (ref 6–20)
CO2: 31 mmol/L (ref 22–32)
CREATININE: 0.75 mg/dL (ref 0.61–1.24)
Calcium: 8.7 mg/dL — ABNORMAL LOW (ref 8.9–10.3)
Chloride: 100 mmol/L (ref 98–111)
Glucose, Bld: 92 mg/dL (ref 70–99)
POTASSIUM: 4.4 mmol/L (ref 3.5–5.1)
SODIUM: 137 mmol/L (ref 135–145)

## 2018-09-24 LAB — CBC
HCT: 44.3 % (ref 39.0–52.0)
HEMOGLOBIN: 14.9 g/dL (ref 13.0–17.0)
MCH: 32.5 pg (ref 26.0–34.0)
MCHC: 33.6 g/dL (ref 30.0–36.0)
MCV: 96.5 fL (ref 80.0–100.0)
NRBC: 0 % (ref 0.0–0.2)
PLATELETS: 371 10*3/uL (ref 150–400)
RBC: 4.59 MIL/uL (ref 4.22–5.81)
RDW: 13.9 % (ref 11.5–15.5)
WBC: 9.6 10*3/uL (ref 4.0–10.5)

## 2018-09-24 NOTE — Telephone Encounter (Signed)
Pt. Had a video bronchoscopy (surgery) 09/19/18. There is no record of a discharge, pt is still in the hosp.Marland Kitchen

## 2018-09-24 NOTE — Progress Notes (Addendum)
[address]Suite [address]       [place] [address]             [phone]      5 Days Post-Op Procedure(s) (LRB): VIDEO BRONCHOSCOPY (N/A) INSERTION OF INTERBRONCHIAL VALVE (IBV) (N/A) Subjective: Feels a little discouraged this morning  Objective: Vital signs in last 24 hours: Temp:  [97.7 F (36.5 C)-98.1 F (36.7 C)] 97.7 F (36.5 C) (11/06 0300) Pulse Rate:  [70-88] 71 (11/06 0300) Cardiac Rhythm: Normal sinus rhythm (11/05 2100) Resp:  [16-26] 23 (11/06 0300) BP: (104-121)/(70-84) 115/84 (11/06 0300) SpO2:  [92 %-98 %] 96 % (11/06 0300)     Intake/Output from previous day: 11/05 0701 - 11/06 0700 In: 240 [P.O.:240] Out: 640 [Urine:600; Chest Tube:40] Intake/Output this shift: No intake/output data recorded.  General appearance: alert, cooperative and no distress Heart: regular rate and rhythm, S1, S2 normal, no murmur, click, rub or gallop Lungs: clear to auscultation bilaterally and chest tube rub present, subcutaneous air noted in right chest wall Abdomen: soft, non-tender; bowel sounds normal; no masses,  no organomegaly Extremities: extremities normal, atraumatic, no cyanosis or edema Wound: clean and dry  Lab Results: Recent Labs    09/24/18 0253  WBC 9.6  HGB 14.9  HCT 44.3  PLT 371   BMET:  Recent Labs    09/24/18 0253  NA 137  K 4.4  CL 100  CO2 31  GLUCOSE 92  BUN 8  CREATININE 0.75  CALCIUM 8.7*    PT/INR: No results for input(s): LABPROT, INR in the last 72 hours. ABG    Component Value Date/Time   PHART 7.427 09/13/2018 0445   HCO3 28.4 (H) 09/13/2018 0445   TCO2 26 10/13/2015 0420   ACIDBASEDEF 2.0 12/22/2010 1522   O2SAT 99.2 09/13/2018 0445   CBG (last 3)  No results for input(s): GLUCAP in the last 72 hours.  Assessment/Plan: S/P Procedure(s) (LRB): VIDEO BRONCHOSCOPY (N/A) INSERTION OF INTERBRONCHIAL VALVE (IBV) (N/A)  1. CV-NSR in the 62s. BP well controlled.  2. Pulm-tolerating room air with good  oxygen saturation, chest tube in place trial on water seal yesterday but developed increased subcutaneous emphysema, therefore back on suction . CXR this morning shows slightly worse subcutaneous air. 3. Renal-creatinine 0.75, electrolytes okay 4. H and H 14.9/44.3, stable 5. Diarrhea-mostly resolved. A few bouts yesterday. Off laxatives.   Plan: Keep on suction for now. Continue to use incentive spirometer. Continue to ambulate as tolerated.    LOS: 20 days    [name] 09/24/2018 Patient seen and examined, agree with above He is understandably frustrated Still has a small air leak. CXR shows resolution of basilar pneumothorax Continue CT to suction  Remo Lipps C. Roxan Hockey, MD [facility] [phone]

## 2018-09-24 NOTE — Progress Notes (Signed)
Physical Therapy Treatment Patient Details Name: [name] MRN: [id number] DOB: 17-Apr-1976 Today's Date: 09/24/2018    History of Present Illness 42 y.o.malewith medical history significant ofasthma, GERD, hypothyroidism, aspergilloma,left upper lobe segmentectomy for aspergilloma/MAI,lung bullous lung disease,who presented with shortness of breath. Dx of tension pneumothorax, chest tube placed urgently.  VATS 09/12/18. s/p intrabronchial valves 11/1     PT Comments    Patient is progressing very well towards their physical therapy goals. Ambulating 500 feet with no assistive device and supervision. No evidence of gross unsteadiness. SpO2 > 90% on RA. Will continue to challenge balance and progress mobility as tolerated.    Follow Up Recommendations  Home health PT     Equipment Recommendations  None recommended by PT    Recommendations for Other Services       Precautions / Restrictions Precautions Precautions: Other (comment) Precaution Comments: chest tube Restrictions Weight Bearing Restrictions: No Other Position/Activity Restrictions: R chest tube with continuous suction    Mobility  Bed Mobility Overal bed mobility: Modified Independent                Transfers Overall transfer level: Modified independent Equipment used: None Transfers: Sit to/from Stand Sit to Stand: Modified independent (Device/Increase time)            Ambulation/Gait Ambulation/Gait assistance: Supervision Gait Distance (Feet): 500 Feet Assistive device: None Gait Pattern/deviations: Step-through pattern;Decreased stride length Gait velocity: decreased Gait velocity interpretation: 1.31 - 2.62 ft/sec, indicative of limited community ambulator General Gait Details: Cues for reciprocal arm swing   Stairs             Wheelchair Mobility    Modified Rankin (Stroke Patients Only)       Balance Overall balance assessment: Mild deficits observed, not formally  tested Sitting-balance support: No upper extremity supported;Feet unsupported Sitting balance-Leahy Scale: Normal     Standing balance support: No upper extremity supported;During functional activity Standing balance-Leahy Scale: Good                              Cognition Arousal/Alertness: Awake/alert Behavior During Therapy: WFL for tasks assessed/performed Overall Cognitive Status: Within Functional Limits for tasks assessed                                        Exercises      General Comments General comments (skin integrity, edema, etc.): VSS      Pertinent Vitals/Pain Pain Assessment: Faces Faces Pain Scale: Hurts little more Pain Location: when coughing Pain Descriptors / Indicators: Sore Pain Intervention(s): Monitored during session    Home Living                      Prior Function            PT Goals (current goals can now be found in the care plan section) Acute Rehab PT Goals Patient 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 : None Difficulty sitting down on and standing up from a chair with arms (e.g., wheelchair, bedside commode, etc,.)?: None Help needed moving to and from a bed to chair (including a wheelchair)?: A Little Help needed walking in hospital room?: A Little Help needed climbing 3-5 steps with a railing? : A Little 6 Click Score: 21    End of Session   Activity Tolerance: Patient tolerated treatment  well Patient left: in chair;with call bell/phone within reach Nurse Communication: Mobility status PT Visit Diagnosis: Difficulty in walking, not elsewhere classified (R26.2)     Time: 9326-7124 PT Time Calculation (min) (ACUTE ONLY): 17 min  Charges:  $Gait Training: 8-22 mins                     Ellamae Sia, PT, DPT [place] Pager [phone] Office [phone]    Willy Eddy 09/24/2018, 5:19 PM

## 2018-09-24 NOTE — Progress Notes (Signed)
Patient ID: [name], male   DOB: 05/11/1976, 42 y.o.   MRN: [id number]  PROGRESS NOTE    [name]  ZDG:[id number] DOB: 1976-01-28 DOA: 09/04/2018 PCP: Wallene Dales, MD   Brief Narrative:  42 year old male with history of asthma, GERD, hypothyroidism, aspergilloma, left upper lobe segmentectomy for aspergilloma/MAI, bullous lung disease presented on 09/04/2018 with shortness of breath and was found to have tension pneumothorax.  CVTS was consulted, right-sided chest tube was placed urgently.  Pneumothorax resolved with chest tube, this was removed on 09/07/2018.  Patient was found to have worsening dyspnea on 09/08/2018 and repeat chest x-ray showed recurrent tension pneumothorax which required another urgent chest tube on 09/08/2018.  He underwent  VATS/bleb resection, pleural abrasion on 09/12/2018 by CVTS. -With persistent air leak, s/p intrabronchial valves 11/1 -now with chest tube to suction -CVTS managing  Assessment & Plan:   Recurrent tension pneumothorax -Chest tube -09/04/2018 by Dr. Roxan Hockey, removed on 09/07/2018 ; repeat chest x-ray was stable without pneumothorax -On 10/21 developed spontaneous tension PTX again -underwent chest tube placement again  -Then had VATS, bleb resection, pleural abrasion on 10/25 -Had persistent air leak following this -Underwent intrabronchial valves on 11/1 -Per CVTS, on chest tube to suction, small air leak, CXR improved  History of MAI -Continue home regimen of ethambutol and azithromycin.   -Outpatient follow-up by infectious disease  History of aspergilloma -History of remote left upper lobe segmentectomy -Continue antifungal Cresemba, prednisone 5 mg daily -Outpatient follow-up with pulmonary and ID  History of severe persistent asthma -Stable.  Continue bronchodilators  Hypothyroidism -Continue Synthroid  Iron deficiency anemia -Hemoglobin stable.  Continue home iron supplement  Severe malnutrition -Follow nutrition  recommendations  Diarrhea -due to laxatives, stopped Dulcolax and senokot  DVT prophylaxis: Heparin Code Status: Full Family Communication: None at bedside Disposition Plan: Home once cleared by CVTS  Consultants: CVTS  Procedures: Chest tube insertion x2, VATS  Antimicrobials:   Ethambutol/Zithromax/Cresemba  Subjective: -no changes from yesterday, chest wall swelling present  Objective: Vitals:   09/23/18 2300 09/24/18 0300 09/24/18 0800 09/24/18 0810  BP: 108/74 115/84  129/78  Pulse: 71 71  76  Resp: 16 (!) 23 20 (!) 23  Temp: 98 F (36.7 C) 97.7 F (36.5 C)  98 F (36.7 C)  TempSrc: Oral Oral  Oral  SpO2: 98% 96% 94% 94%  Weight:      Height:        Intake/Output Summary (Last 24 hours) at 09/24/2018 0932 Last data filed at 09/24/2018 5956 Gross per 24 hour  Intake 480 ml  Output 540 ml  Net -60 ml   Filed Weights   09/04/18 1330 09/19/18 0913  Weight: 51.3 kg 51.3 kg    Examination:  Gen: Awake, Alert, Oriented X 3, emaciated male, ill appearing HEENT: PERRLA, mild subcu emphysema in neck Lungs: improved air movement, R>L subcu emphysema in chest wall CVS: S1S2/RRR Abd: soft, Non tender, non distended, BS present Extremities: No Cyanosis, Clubbing or edema Skin: no new rashes  Data Reviewed: I have personally reviewed following labs and imaging studies  CBC: Recent Labs  Lab 09/24/18 0253  WBC 9.6  HGB 14.9  HCT 44.3  MCV 96.5  PLT 387   Basic Metabolic Panel: Recent Labs  Lab 09/24/18 0253  NA 137  K 4.4  CL 100  CO2 31  GLUCOSE 92  BUN 8  CREATININE 0.75  CALCIUM 8.7*   GFR: Estimated Creatinine Clearance: 85.5 mL/min (by C-G formula based  on SCr of 0.75 mg/dL). Liver Function Tests: No results for input(s): AST, ALT, ALKPHOS, BILITOT, PROT, ALBUMIN in the last 168 hours. No results for input(s): LIPASE, AMYLASE in the last 168 hours. No results for input(s): AMMONIA in the last 168 hours. Coagulation Profile: No results  for input(s): INR, PROTIME in the last 168 hours. Cardiac Enzymes: No results for input(s): CKTOTAL, CKMB, CKMBINDEX, TROPONINI in the last 168 hours. BNP (last 3 results) No results for input(s): PROBNP in the last 8760 hours. HbA1C: No results for input(s): HGBA1C in the last 72 hours. CBG: No results for input(s): GLUCAP in the last 168 hours. Lipid Profile: No results for input(s): CHOL, HDL, LDLCALC, TRIG, CHOLHDL, LDLDIRECT in the last 72 hours. Thyroid Function Tests: No results for input(s): TSH, T4TOTAL, FREET4, T3FREE, THYROIDAB in the last 72 hours. Anemia Panel: No results for input(s): VITAMINB12, FOLATE, FERRITIN, TIBC, IRON, RETICCTPCT in the last 72 hours. Sepsis Labs: No results for input(s): PROCALCITON, LATICACIDVEN in the last 168 hours.  No results found for this or any previous visit (from the past 240 hour(s)).       Radiology Studies: Dg Chest Port 1 View  Result Date: 09/23/2018 CLINICAL DATA:  Follow-up pneumothorax. EXAM: PORTABLE CHEST 1 VIEW COMPARISON:  09/22/2018 and prior exams FINDINGS: Cardiomediastinal silhouette is unchanged. RIGHT thoracostomy tube and RIGHT endobronchial valves again noted with unchanged RIGHT basilar pneumothorax and diffuse bilateral subcutaneous emphysema. Severe bullous changes in the UPPER lungs again noted. Scattered atelectasis/scarring within the lungs again noted. Surgical sutures overlying the LEFT LOWER lung noted. IMPRESSION: Unchanged appearance of the chest with RIGHT thoracostomy tube, RIGHT basilar pneumothorax and bilateral subcutaneous emphysema. Electronically Signed   By: Margarette Canada M.D.   On: 09/23/2018 08:05        Scheduled Meds: . azithromycin  500 mg Oral Daily  . enoxaparin (LOVENOX) injection  40 mg Subcutaneous Q24H  . ethambutol  1,000 mg Oral Daily  . feeding supplement (ENSURE ENLIVE)  237 mL Per Tube TID BM  . fluticasone furoate-vilanterol  1 puff Inhalation Daily  . HYDROmorphone    Intravenous Q4H  . Isavuconazonium Sulfate  372 mg Oral Daily  . levothyroxine  75 mcg Oral QAC breakfast  . multivitamin with minerals  1 tablet Oral Daily  . predniSONE  5 mg Oral Q breakfast  . senna-docusate  1 tablet Oral QHS  . umeclidinium bromide  1 puff Inhalation Daily   Continuous Infusions: . lactated ringers 10 mL/hr at 09/22/18 2300  . potassium chloride       LOS: 20 days        Domenic Polite, MD [facility] Page via [email or web address]  If 7PM-7AM, please contact night-coverage [email or web address] Password [email or web address] 09/24/2018, 9:32 AM

## 2018-09-25 ENCOUNTER — Inpatient Hospital Stay (HOSPITAL_COMMUNITY): Payer: BLUE CROSS/BLUE SHIELD

## 2018-09-25 MED ORDER — GUAIFENESIN ER 600 MG PO TB12
600.0000 mg | ORAL_TABLET | Freq: Two times a day (BID) | ORAL | Status: DC
  Administered 2018-09-25 – 2018-10-06 (×23): 600 mg via ORAL
  Filled 2018-09-25 (×23): qty 1

## 2018-09-25 NOTE — Progress Notes (Signed)
Patient ID: MARICELA KAWAHARA, male   DOB: Nov 13, 1976, 42 y.o.   MRN: [id number]  PROGRESS NOTE    JA OHMAN  [id number] DOB: 18-Jul-1976 DOA: 09/04/2018 PCP: Wallene Dales, MD   Brief Narrative:  42 year old male with history of asthma, GERD, hypothyroidism, aspergilloma, left upper lobe segmentectomy for aspergilloma/MAI, bullous lung disease presented on 09/04/2018 with shortness of breath and was found to have tension pneumothorax.  CVTS was consulted, right-sided chest tube was placed urgently.  Pneumothorax resolved with chest tube, this was removed on 09/07/2018.  Patient was found to have worsening dyspnea on 09/08/2018 and repeat chest x-ray showed recurrent tension pneumothorax which required another urgent chest tube on 09/08/2018.  He underwent  VATS on 09/12/2018 by CVTS. Because of persistent air leak, he underwent intrabronchial valves on 09/19/2018 by CVTS.   Assessment & Plan:   Principal Problem:   Tension pneumothorax Active Problems:   Mycobacterium avium complex ([place])   Invasive pulmonary aspergillosis (HCC)   Severe persistent asthma   Hypothyroidism   Iron deficiency anemia   Protein-calorie malnutrition, severe   Recurrent tension pneumothorax -Chest tube placed urgently on 09/04/2018 by Dr. Roxan Hockey which was subsequently removed on 09/07/2018 ; repeat chest x-ray was stable without pneumothorax -Patient became dyspneic again on 09/08/2018 and developed another spontaneous tension pneumothorax which required chest tube placement again by Dr. Roxan Hockey -Status post VATS and stapling of blebs in the right lower and middle lobes by CVTS -Patient had persistent air leak following this for which he underwent intrabronchial valves on 09/19/2018 by CVTS -CVTS is managing chest tube. -Respiratory status stable  History of MAI -Continue home regimen of ethambutol and azithromycin.  Outpatient follow-up by infectious disease  History of aspergilloma -History of  remote left upper lobe segmentectomy -Continue antifungal Cresemba -Outpatient follow-up with pulmonary and ID -continue prednisone 5 mg daily  History of severe persistent asthma -Stable.  Continue bronchodilators  Hypothyroidism -Continue Synthroid  Iron deficiency anemia -Hemoglobin stable.  Continue home iron supplement  Severe malnutrition -Follow nutrition recommendations    DVT prophylaxis: Heparin Code Status: Full Family Communication: None at bedside Disposition Plan: Home once cleared by CVTS  Consultants: CVTS  Procedures: Chest tube insertion x2; VATS; intrabronchial valves placement  Antimicrobials:   Ethambutol/Zithromax/Cresemba  Subjective: Patient seen and examined at bedside.  Patient denies any overnight worsening shortness of breath or cough or chest pain.  No overnight fever or vomiting. Objective: Vitals:   09/25/18 0300 09/25/18 0747 09/25/18 0800 09/25/18 0825  BP: (!) 128/94   131/83  Pulse: 76   66  Resp: (!) 21  (!) 27 17  Temp: (!) 96.9 F (36.1 C)   98.2 F (36.8 C)  TempSrc: Oral   Oral  SpO2: 95% 100% 92% 100%  Weight:      Height:        Intake/Output Summary (Last 24 hours) at 09/25/2018 1004 Last data filed at 09/25/2018 0800 Gross per 24 hour  Intake 480 ml  Output 1210 ml  Net -730 ml   Filed Weights   09/04/18 1330 09/19/18 0913  Weight: 51.3 kg 51.3 kg    Examination:  General exam: No distress. Respiratory system: Bilateral decreased breath sounds at bases, no wheezing. Right-sided chest tube present.   Cardiovascular system: S1 & S2 heard, Rate controlled Gastrointestinal system: Abdomen is nondistended, soft and nontender. Normal bowel sounds heard. Extremities: No cyanosis, edema   Data Reviewed: I have personally reviewed following labs and imaging studies  CBC:  Recent Labs  Lab 09/24/18 0253  WBC 9.6  HGB 14.9  HCT 44.3  MCV 96.5  PLT 979   Basic Metabolic Panel: Recent Labs  Lab  09/24/18 0253  NA 137  K 4.4  CL 100  CO2 31  GLUCOSE 92  BUN 8  CREATININE 0.75  CALCIUM 8.7*   GFR: Estimated Creatinine Clearance: 85.5 mL/min (by C-G formula based on SCr of 0.75 mg/dL). Liver Function Tests: No results for input(s): AST, ALT, ALKPHOS, BILITOT, PROT, ALBUMIN in the last 168 hours. No results for input(s): LIPASE, AMYLASE in the last 168 hours. No results for input(s): AMMONIA in the last 168 hours. Coagulation Profile: No results for input(s): INR, PROTIME in the last 168 hours. Cardiac Enzymes: No results for input(s): CKTOTAL, CKMB, CKMBINDEX, TROPONINI in the last 168 hours. BNP (last 3 results) No results for input(s): PROBNP in the last 8760 hours. HbA1C: No results for input(s): HGBA1C in the last 72 hours. CBG: No results for input(s): GLUCAP in the last 168 hours. Lipid Profile: No results for input(s): CHOL, HDL, LDLCALC, TRIG, CHOLHDL, LDLDIRECT in the last 72 hours. Thyroid Function Tests: No results for input(s): TSH, T4TOTAL, FREET4, T3FREE, THYROIDAB in the last 72 hours. Anemia Panel: No results for input(s): VITAMINB12, FOLATE, FERRITIN, TIBC, IRON, RETICCTPCT in the last 72 hours. Sepsis Labs: No results for input(s): PROCALCITON, LATICACIDVEN in the last 168 hours.  No results found for this or any previous visit (from the past 240 hour(s)).       Radiology Studies: Dg Chest Port 1 View  Result Date: 09/25/2018 CLINICAL DATA:  Followup right basilar pneumothorax with a chest tube in place. EXAM: PORTABLE CHEST 1 VIEW COMPARISON:  Yesterday. FINDINGS: Stable right chest tube. Very small right basilar pneumothorax without gross change. Stable biapical bullous changes and right surgical staples and vascular devices. Left basilar surgical staples are again demonstrated. No significant change in bilateral subcutaneous emphysema. Normal sized heart. Unremarkable bones. IMPRESSION: 1. No significant change in a less than 5% right basilar  pneumothorax with a chest tube in place. 2. Stable changes of COPD. Electronically Signed   By: Claudie Revering M.D.   On: 09/25/2018 08:47   Dg Chest Port 1 View  Result Date: 09/24/2018 CLINICAL DATA:  History of bullous lung disease, asthma an aspergilloma. Shortness of breath since 09/04/2018. Chest tube in place. EXAM: PORTABLE CHEST 1 VIEW COMPARISON:  Single-view of the chest 09/23/2018, 09/22/2018 and 09/18/2018. FINDINGS: Right chest tube is unchanged. Right basilar pneumothorax seen on yesterday's examination has decreased. Large bullae in the lung apices bilaterally are again seen. Subcutaneous emphysema appears unchanged since yesterday's study. Heart size is normal. Aortic atherosclerosis is noted. No acute or focal bony abnormality. IMPRESSION: Decreased right basilar pneumothorax with a chest tube in place. No change in extensive subcutaneous emphysema. No change in large bullous lesions bilaterally. Electronically Signed   By: Inge Rise M.D.   On: 09/24/2018 09:39        Scheduled Meds: . azithromycin  500 mg Oral Daily  . enoxaparin (LOVENOX) injection  40 mg Subcutaneous Q24H  . ethambutol  1,000 mg Oral Daily  . feeding supplement (ENSURE ENLIVE)  237 mL Per Tube TID BM  . fluticasone furoate-vilanterol  1 puff Inhalation Daily  . guaiFENesin  600 mg Oral BID  . HYDROmorphone   Intravenous Q4H  . Isavuconazonium Sulfate  372 mg Oral Daily  . levothyroxine  75 mcg Oral QAC breakfast  . multivitamin with minerals  1 tablet Oral Daily  . predniSONE  5 mg Oral Q breakfast  . senna-docusate  1 tablet Oral QHS  . umeclidinium bromide  1 puff Inhalation Daily   Continuous Infusions: . lactated ringers 10 mL/hr at 09/22/18 2300  . potassium chloride       LOS: 21 days        Aline August, MD [facility] Pager [phone]  If 7PM-7AM, please contact night-coverage [email or web address] Password [email or web address] 09/25/2018, 10:04 AM

## 2018-09-25 NOTE — Progress Notes (Signed)
[address]       [place] [id number]             [phone]      6 Days Post-Op Procedure(s) (LRB): VIDEO BRONCHOSCOPY (N/A) INSERTION OF INTERBRONCHIAL VALVE (IBV) (N/A) Subjective: No issues overnight  Objective: Vital signs in last 24 hours: Temp:  [96.9 F (36.1 C)-98.6 F (37 C)] 96.9 F (36.1 C) (11/07 0300) Pulse Rate:  [69-85] 76 (11/07 0300) Cardiac Rhythm: Normal sinus rhythm (11/06 2030) Resp:  [15-25] 21 (11/07 0300) BP: (108-129)/(73-94) 128/94 (11/07 0300) SpO2:  [94 %-97 %] 95 % (11/07 0300)     Intake/Output from previous day: 11/06 0701 - 11/07 0700 In: 720 [P.O.:720] Out: 970 [Urine:950; Chest Tube:20] Intake/Output this shift: No intake/output data recorded.  General appearance: alert, cooperative and no distress Heart: regular rate and rhythm, S1, S2 normal, no murmur, click, rub or gallop Lungs: clear to auscultation bilaterally and subq air crackles Abdomen: soft, non-tender; bowel sounds normal; no masses,  no organomegaly Extremities: extremities normal, atraumatic, no cyanosis or edema Wound: clean and dry  Lab Results: Recent Labs    09/24/18 0253  WBC 9.6  HGB 14.9  HCT 44.3  PLT 371   BMET:  Recent Labs    09/24/18 0253  NA 137  K 4.4  CL 100  CO2 31  GLUCOSE 92  BUN 8  CREATININE 0.75  CALCIUM 8.7*    PT/INR: No results for input(s): LABPROT, INR in the last 72 hours. ABG    Component Value Date/Time   PHART 7.427 09/13/2018 0445   HCO3 28.4 (H) 09/13/2018 0445   TCO2 26 10/13/2015 0420   ACIDBASEDEF 2.0 12/22/2010 1522   O2SAT 99.2 09/13/2018 0445   CBG (last 3)  No results for input(s): GLUCAP in the last 72 hours.  Assessment/Plan: S/P Procedure(s) (LRB): VIDEO BRONCHOSCOPY (N/A) INSERTION OF INTERBRONCHIAL VALVE (IBV) (N/A)   1. CV-NSR in the 70s, BP well controlled 2. Pulm- CXR showed: Decreased right basilar pneumothorax with a chest tube in place.No change in extensive  subcutaneous emphysema.No change in large bullous lesions bilaterally. 3. Renal-creatinine 0.75, electrolytes okay 4. H and H stable 5. Diarrhea-resolved  Plan: Await CXR from this morning. Air leak is much better today.    LOS: 21 days    [name] 09/25/2018

## 2018-09-26 ENCOUNTER — Inpatient Hospital Stay (HOSPITAL_COMMUNITY): Payer: BLUE CROSS/BLUE SHIELD

## 2018-09-26 NOTE — Progress Notes (Signed)
Physical Therapy Treatment and Discharge  Patient Details Name: [name] MRN: [id number] DOB: Dec 26, 1975 Today's Date: 09/26/2018    History of Present Illness 42 y.o.malewith medical history significant ofasthma, GERD, hypothyroidism, aspergilloma,left upper lobe segmentectomy for aspergilloma/MAI,lung bullous lung disease,who presented with shortness of breath. Dx of tension pneumothorax, chest tube placed urgently.  VATS 09/12/18. s/p intrabronchial valves 11/1     PT Comments    Patient met his physical therapy goals during his inpatient stay. Currently ambulating > 500 feet independently with no assistive device and able to negotiate 10 steps today with use of railing. Oxygen saturations maintaining > 90% on RA for the majority of the session, with one episode of decrease to 89% SpO2 upon return to room but rebounded quickly to 94% with instructions for pursed lip breathing. Patient reports he has been ambulating 3 times per day. Patient has no further questions/concerns. No further acute PT needs at this time. PT signing off. Please re-consult if needs change.   Follow Up Recommendations  No PT follow up     Equipment Recommendations  None recommended by PT    Recommendations for Other Services       Precautions / Restrictions Precautions Precautions: Other (comment) Precaution Comments: chest tube Restrictions Weight Bearing Restrictions: No Other Position/Activity Restrictions: R chest tube with continuous suction    Mobility  Bed Mobility               General bed mobility comments: OOB in chair  Transfers Overall transfer level: Independent Equipment used: None                Ambulation/Gait Ambulation/Gait assistance: Independent Gait Distance (Feet): 500 Feet Assistive device: None Gait Pattern/deviations: WFL(Within Functional Limits)   Gait velocity interpretation: >2.62 ft/sec, indicative of community ambulatory General Gait Details:  Patient with adequate gait speed, no unsteadiness noted, and slightly forward head posture throughout ambulation   Stairs Stairs: Yes Stairs assistance: Modified independent (Device/Increase time) Stair Management: One rail Left Number of Stairs: 10     Wheelchair Mobility    Modified Rankin (Stroke Patients Only)       Balance Overall balance assessment: Independent                               Standardized Balance Assessment Standardized Balance Assessment : Dynamic Gait Index   Dynamic Gait Index Level Surface: Normal Change in Gait Speed: Mild Impairment Gait with Horizontal Head Turns: Normal Gait with Vertical Head Turns: Normal Gait and Pivot Turn: Normal Step Over Obstacle: Normal Step Around Obstacles: Normal Steps: Mild Impairment Total Score: 22      Cognition Arousal/Alertness: Awake/alert Behavior During Therapy: WFL for tasks assessed/performed Overall Cognitive Status: Within Functional Limits for tasks assessed                                        Exercises      General Comments General comments (skin integrity, edema, etc.): HR between 90 and 113 bpm      Pertinent Vitals/Pain Pain Assessment: Faces Faces Pain Scale: Hurts a little bit Pain Location: when coughing Pain Descriptors / Indicators: Sore Pain Intervention(s): Monitored during session    Home Living                      Prior Function  PT Goals (current goals can now be found in the care plan section) Acute Rehab PT Goals Patient Stated Goal: return to work at [employer] and walk 3 times a day PT Goal Formulation: With patient Time For Goal Achievement: 09/22/18 Potential to Achieve Goals: Good Progress towards PT goals: Goals met/education completed, patient discharged from PT    Frequency    Min 3X/week      PT Plan Other (comment)(d/c therapies)    Co-evaluation              AM-PAC PT "6 Clicks"  Daily Activity  Outcome Measure  Difficulty turning over in bed (including adjusting bedclothes, sheets and blankets)?: None Difficulty moving from lying on back to sitting on the side of the bed? : None Difficulty sitting down on and standing up from a chair with arms (e.g., wheelchair, bedside commode, etc,.)?: None Help needed moving to and from a bed to chair (including a wheelchair)?: None Help needed walking in hospital room?: None Help needed climbing 3-5 steps with a railing? : None 6 Click Score: 24    End of Session   Activity Tolerance: Patient tolerated treatment well Patient left: in chair;with call bell/phone within reach Nurse Communication: Mobility status PT Visit Diagnosis: Difficulty in walking, not elsewhere classified (R26.2)     Time: 4975-3005 PT Time Calculation (min) (ACUTE ONLY): 21 min  Charges:  $Therapeutic Activity: 8-22 mins                     Ellamae Sia, PT, DPT Acute Rehabilitation Services Pager [phone] Office [phone]    Willy Eddy 09/26/2018, 10:13 AM

## 2018-09-26 NOTE — Progress Notes (Signed)
Patient ID: [name], male   DOB: May 22, 1976, 42 y.o.   MRN: [id number]  PROGRESS NOTE    [name]  [id number]:[id number] DOB: [birth year] DOA: 09/04/2018 PCP: Wallene Dales, MD   Brief Narrative:  42 year old male with history of asthma, GERD, hypothyroidism, aspergilloma, left upper lobe segmentectomy for aspergilloma/MAI, bullous lung disease presented on 09/04/2018 with shortness of breath and was found to have tension pneumothorax.  CVTS was consulted, right-sided chest tube was placed urgently.  Pneumothorax resolved with chest tube, this was removed on 09/07/2018.  Patient was found to have worsening dyspnea on 09/08/2018 and repeat chest x-ray showed recurrent tension pneumothorax which required another urgent chest tube on 09/08/2018.  He underwent  VATS on 09/12/2018 by CVTS. Because of persistent air leak, he underwent intrabronchial valves on 09/19/2018 by CVTS.   Assessment & Plan:   Principal Problem:   Tension pneumothorax Active Problems:   Mycobacterium avium complex ([place])   Invasive pulmonary aspergillosis (HCC)   Severe persistent asthma   Hypothyroidism   Iron deficiency anemia   Protein-calorie malnutrition, severe   Recurrent tension pneumothorax -Chest tube placed urgently on 09/04/2018 by Dr. Roxan Hockey which was subsequently removed on 09/07/2018 ; repeat chest x-ray was stable without pneumothorax -Patient became dyspneic again on 09/08/2018 and developed another spontaneous tension pneumothorax which required chest tube placement again by Dr. Roxan Hockey -Status post VATS and stapling of blebs in the right lower and middle lobes by CVTS -Patient had persistent air leak following this for which he underwent intrabronchial valves on 09/19/2018 by CVTS -CVTS is managing chest tube: Air leak is unchanged and patient has not tolerated waterseal.  Keep chest tube to suction as per CVTS -Respiratory status stable  History of MAI -Continue home regimen of ethambutol  and azithromycin.  Outpatient follow-up by infectious disease  History of aspergilloma -History of remote left upper lobe segmentectomy -Continue antifungal Cresemba -Outpatient follow-up with pulmonary and ID -continue prednisone 5 mg daily  History of severe persistent asthma -Stable.  Continue bronchodilators  Hypothyroidism -Continue Synthroid  Iron deficiency anemia -Hemoglobin stable.  Continue home iron supplement  Severe malnutrition -Follow nutrition recommendations    DVT prophylaxis: Heparin Code Status: Full Family Communication: None at bedside Disposition Plan: Home once cleared by CVTS  Consultants: CVTS  Procedures: Chest tube insertion x2; VATS; intrabronchial valves placement  Antimicrobials:   Ethambutol/Zithromax/Cresemba  Subjective: Patient seen and examined at bedside.  Patient is coughing slightly more today but denies worsening shortness of breath, chest pain, fever or vomiting. Objective: Vitals:   09/26/18 0410 09/26/18 0420 09/26/18 0730 09/26/18 0903  BP:  126/89 121/76   Pulse:  71 61 100  Resp: _0 Temp:  97.9 F (36.6 C) 97.8 F (36.6 C)   TempSrc:  Oral Oral   SpO2: 100% 100% 100% 96%  Weight:      Height:        Intake/Output Summary (Last 24 hours) at 09/26/2018 1101 Last data filed at 09/26/2018 1000 Gross per 24 hour  Intake 1200 ml  Output 1075 ml  Net 125 ml   Filed Weights   09/04/18 1330 09/19/18 0913  Weight: 51.3 kg 51.3 kg    Examination:  General exam: No acute distress  respiratory system: Bilateral decreased breath sounds at bases, no wheezing.  Some scattered crackles.  Right-sided chest tube present.   Cardiovascular system: S1 & S2 heard, Rate controlled Gastrointestinal system: Abdomen is nondistended, soft and nontender. Normal bowel sounds  heard. Extremities: No cyanosis, edema   Data Reviewed: I have personally reviewed following labs and imaging studies  CBC: Recent Labs  Lab  09/24/18 0253  WBC 9.6  HGB 14.9  HCT 44.3  MCV 96.5  PLT 297   Basic Metabolic Panel: Recent Labs  Lab 09/24/18 0253  NA 137  K 4.4  CL 100  CO2 31  GLUCOSE 92  BUN 8  CREATININE 0.75  CALCIUM 8.7*   GFR: Estimated Creatinine Clearance: 85.5 mL/min (by C-G formula based on SCr of 0.75 mg/dL). Liver Function Tests: No results for input(s): AST, ALT, ALKPHOS, BILITOT, PROT, ALBUMIN in the last 168 hours. No results for input(s): LIPASE, AMYLASE in the last 168 hours. No results for input(s): AMMONIA in the last 168 hours. Coagulation Profile: No results for input(s): INR, PROTIME in the last 168 hours. Cardiac Enzymes: No results for input(s): CKTOTAL, CKMB, CKMBINDEX, TROPONINI in the last 168 hours. BNP (last 3 results) No results for input(s): PROBNP in the last 8760 hours. HbA1C: No results for input(s): HGBA1C in the last 72 hours. CBG: No results for input(s): GLUCAP in the last 168 hours. Lipid Profile: No results for input(s): CHOL, HDL, LDLCALC, TRIG, CHOLHDL, LDLDIRECT in the last 72 hours. Thyroid Function Tests: No results for input(s): TSH, T4TOTAL, FREET4, T3FREE, THYROIDAB in the last 72 hours. Anemia Panel: No results for input(s): VITAMINB12, FOLATE, FERRITIN, TIBC, IRON, RETICCTPCT in the last 72 hours. Sepsis Labs: No results for input(s): PROCALCITON, LATICACIDVEN in the last 168 hours.  No results found for this or any previous visit (from the past 240 hour(s)).       Radiology Studies: Dg Chest Port 1 View  Result Date: 09/26/2018 CLINICAL DATA:  Chest tube follow-up EXAM: PORTABLE CHEST 1 VIEW COMPARISON:  Yesterday FINDINGS: Stable positioning of right-sided chest tube. Small pneumothorax at the right base is unchanged. Apical bullae again noted. No acute opacity. Endobronchial valves present on the right. Normal heart size. Stable soft tissue emphysema. IMPRESSION: 1. Unchanged trace pneumothorax at the right base. 2. Similar positioning  of endobronchial valves. 3. Unchanged chest tube positioning. Electronically Signed   By: Monte Fantasia M.D.   On: 09/26/2018 09:34   Dg Chest Port 1 View  Result Date: 09/25/2018 CLINICAL DATA:  Followup right basilar pneumothorax with a chest tube in place. EXAM: PORTABLE CHEST 1 VIEW COMPARISON:  Yesterday. FINDINGS: Stable right chest tube. Very small right basilar pneumothorax without gross change. Stable biapical bullous changes and right surgical staples and vascular devices. Left basilar surgical staples are again demonstrated. No significant change in bilateral subcutaneous emphysema. Normal sized heart. Unremarkable bones. IMPRESSION: 1. No significant change in a less than 5% right basilar pneumothorax with a chest tube in place. 2. Stable changes of COPD. Electronically Signed   By: Claudie Revering M.D.   On: 09/25/2018 08:47        Scheduled Meds: . azithromycin  500 mg Oral Daily  . enoxaparin (LOVENOX) injection  40 mg Subcutaneous Q24H  . ethambutol  1,000 mg Oral Daily  . feeding supplement (ENSURE ENLIVE)  237 mL Per Tube TID BM  . fluticasone furoate-vilanterol  1 puff Inhalation Daily  . guaiFENesin  600 mg Oral BID  . HYDROmorphone   Intravenous Q4H  . Isavuconazonium Sulfate  372 mg Oral Daily  . levothyroxine  75 mcg Oral QAC breakfast  . multivitamin with minerals  1 tablet Oral Daily  . predniSONE  5 mg Oral Q breakfast  .  senna-docusate  1 tablet Oral QHS  . umeclidinium bromide  1 puff Inhalation Daily   Continuous Infusions: . lactated ringers 10 mL/hr at 09/22/18 2300  . potassium chloride       LOS: 22 days        Brett August, MD [facility] Pager [phone]  If 7PM-7AM, please contact night-coverage [email or web address] Password [email or web address] 09/26/2018, 11:01 AM

## 2018-09-26 NOTE — Progress Notes (Signed)
7 Days Post-Op Procedure(s) (LRB): VIDEO BRONCHOSCOPY (N/A) INSERTION OF INTERBRONCHIAL VALVE (IBV) (N/A) Subjective: No complaints this AM  Objective: Vital signs in last 24 hours: Temp:  [97.6 F (36.4 C)-98.1 F (36.7 C)] 97.8 F (36.6 C) (11/08 0730) Pulse Rate:  [61-100] 100 (11/08 0903) Cardiac Rhythm: Normal sinus rhythm (11/08 0730) Resp:  [11-27] 20 (11/08 0903) BP: (103-130)/(76-89) 121/76 (11/08 0730) SpO2:  [95 %-100 %] 96 % (11/08 0903)  Hemodynamic parameters for last 24 hours:    Intake/Output from previous day: 11/07 0701 - 11/08 0700 In: 1320 [P.O.:720; I.V.:600] Out: 1665 [Urine:1535; Chest Tube:130] Intake/Output this shift: Total I/O In: 120 [P.O.:120] Out: 0   General appearance: alert, cooperative and no distress Heart: regular rate and rhythm Lungs: clear to auscultation bilaterally small air leak  Lab Results: Recent Labs    09/24/18 0253  WBC 9.6  HGB 14.9  HCT 44.3  PLT 371   BMET:  Recent Labs    09/24/18 0253  NA 137  K 4.4  CL 100  CO2 31  GLUCOSE 92  BUN 8  CREATININE 0.75  CALCIUM 8.7*    PT/INR: No results for input(s): LABPROT, INR in the last 72 hours. ABG    Component Value Date/Time   PHART 7.427 09/13/2018 0445   HCO3 28.4 (H) 09/13/2018 0445   TCO2 26 10/13/2015 0420   ACIDBASEDEF 2.0 12/22/2010 1522   O2SAT 99.2 09/13/2018 0445   CBG (last 3)  No results for input(s): GLUCAP in the last 72 hours.  Assessment/Plan: S/P Procedure(s) (LRB): VIDEO BRONCHOSCOPY (N/A) INSERTION OF INTERBRONCHIAL VALVE (IBV) (N/A) - Air leak unchanged has not tolerated water seal  Keep CT to suction   LOS: 22 days    Melrose Nakayama 09/26/2018

## 2018-09-26 NOTE — Plan of Care (Signed)
Patient has ambulated in hallway x 4.  Patient tolerating well.  He is intermittently putting on 2L [place].  Will continue to monitor.

## 2018-09-27 ENCOUNTER — Inpatient Hospital Stay (HOSPITAL_COMMUNITY): Payer: BLUE CROSS/BLUE SHIELD

## 2018-09-27 NOTE — Progress Notes (Signed)
Patient ID: [name], male   DOB: Nov 08, 1976, 42 y.o.   MRN: [id number]  PROGRESS NOTE    [name]  VQM:[id number] DOB: [birth year] DOA: 09/04/2018 PCP: Wallene Dales, MD   Brief Narrative:  42 year old male with history of asthma, GERD, hypothyroidism, aspergilloma, left upper lobe segmentectomy for aspergilloma/MAI, bullous lung disease presented on 09/04/2018 with shortness of breath and was found to have tension pneumothorax.  CVTS was consulted, right-sided chest tube was placed urgently.  Pneumothorax resolved with chest tube, this was removed on 09/07/2018.  Patient was found to have worsening dyspnea on 09/08/2018 and repeat chest x-ray showed recurrent tension pneumothorax which required another urgent chest tube on 09/08/2018.  He underwent  VATS on 09/12/2018 by CVTS. Because of persistent air leak, he underwent intrabronchial valves on 09/19/2018 by CVTS.   Assessment & Plan:   Principal Problem:   Tension pneumothorax Active Problems:   Mycobacterium avium complex ([place])   Invasive pulmonary aspergillosis (HCC)   Severe persistent asthma   Hypothyroidism   Iron deficiency anemia   Protein-calorie malnutrition, severe   Recurrent tension pneumothorax -Chest tube placed urgently on 09/04/2018 by Dr. Roxan Hockey which was subsequently removed on 09/07/2018 ; repeat chest x-ray was stable without pneumothorax -Patient became dyspneic again on 09/08/2018 and developed another spontaneous tension pneumothorax which required chest tube placement again by Dr. Roxan Hockey -Status post VATS and stapling of blebs in the right lower and middle lobes by CVTS -Patient had persistent air leak following this for which he underwent intrabronchial valves on 09/19/2018 by CVTS -CVTS is managing chest tube -Respiratory status stable  History of MAI -Continue home regimen of ethambutol and azithromycin.  Outpatient follow-up by infectious disease  History of aspergilloma -History of  remote left upper lobe segmentectomy -Continue antifungal Cresemba -Outpatient follow-up with pulmonary and ID -continue prednisone 5 mg daily  History of severe persistent asthma -Stable.  Continue bronchodilators  Hypothyroidism -Continue Synthroid  Iron deficiency anemia -Hemoglobin stable.  Continue home iron supplement  Severe malnutrition -Follow nutrition recommendations    DVT prophylaxis: Heparin Code Status: Full Family Communication: None at bedside Disposition Plan: Home once cleared by CVTS  Consultants: CVTS  Procedures: Chest tube insertion x2; VATS; intrabronchial valves placement  Antimicrobials:   Ethambutol/Zithromax/Cresemba  Subjective: Patient seen and examined at bedside.  No overnight worsening shortness of breath or fever.  Still coughing intermittently. Objective: Vitals:   09/26/18 1615 09/26/18 2008 09/26/18 2319 09/27/18 0345  BP: 101/68 108/76 113/74 112/80  Pulse: 72 67 65 81  Resp: (!) _0 Temp: 98.4 F (36.9 C) 98.1 F (36.7 C) 97.9 F (36.6 C) 97.6 F (36.4 C)  TempSrc: Oral Oral Oral Oral  SpO2: 100% 98% 96% 95%  Weight:      Height:        Intake/Output Summary (Last 24 hours) at 09/27/2018 0743 Last data filed at 09/27/2018 0310 Gross per 24 hour  Intake 600 ml  Output 600 ml  Net 0 ml   Filed Weights   09/04/18 1330 09/19/18 0913  Weight: 51.3 kg 51.3 kg    Examination:  General exam: No distress  respiratory system: Bilateral decreased breath sounds at bases.  Some scattered crackles.  Right-sided chest tube present.   Cardiovascular system: S1 & S2 heard, Rate controlled Gastrointestinal system: Abdomen is nondistended, soft and nontender. Normal bowel sounds heard. Extremities: No cyanosis, edema   Data Reviewed: I have personally reviewed following labs and imaging studies  CBC:  Recent Labs  Lab 09/24/18 0253  WBC 9.6  HGB 14.9  HCT 44.3  MCV 96.5  PLT 341   Basic Metabolic  Panel: Recent Labs  Lab 09/24/18 0253  NA 137  K 4.4  CL 100  CO2 31  GLUCOSE 92  BUN 8  CREATININE 0.75  CALCIUM 8.7*   GFR: Estimated Creatinine Clearance: 85.5 mL/min (by C-G formula based on SCr of 0.75 mg/dL). Liver Function Tests: No results for input(s): AST, ALT, ALKPHOS, BILITOT, PROT, ALBUMIN in the last 168 hours. No results for input(s): LIPASE, AMYLASE in the last 168 hours. No results for input(s): AMMONIA in the last 168 hours. Coagulation Profile: No results for input(s): INR, PROTIME in the last 168 hours. Cardiac Enzymes: No results for input(s): CKTOTAL, CKMB, CKMBINDEX, TROPONINI in the last 168 hours. BNP (last 3 results) No results for input(s): PROBNP in the last 8760 hours. HbA1C: No results for input(s): HGBA1C in the last 72 hours. CBG: No results for input(s): GLUCAP in the last 168 hours. Lipid Profile: No results for input(s): CHOL, HDL, LDLCALC, TRIG, CHOLHDL, LDLDIRECT in the last 72 hours. Thyroid Function Tests: No results for input(s): TSH, T4TOTAL, FREET4, T3FREE, THYROIDAB in the last 72 hours. Anemia Panel: No results for input(s): VITAMINB12, FOLATE, FERRITIN, TIBC, IRON, RETICCTPCT in the last 72 hours. Sepsis Labs: No results for input(s): PROCALCITON, LATICACIDVEN in the last 168 hours.  No results found for this or any previous visit (from the past 240 hour(s)).       Radiology Studies: Dg Chest Port 1 View  Result Date: 09/26/2018 CLINICAL DATA:  Chest tube follow-up EXAM: PORTABLE CHEST 1 VIEW COMPARISON:  Yesterday FINDINGS: Stable positioning of right-sided chest tube. Small pneumothorax at the right base is unchanged. Apical bullae again noted. No acute opacity. Endobronchial valves present on the right. Normal heart size. Stable soft tissue emphysema. IMPRESSION: 1. Unchanged trace pneumothorax at the right base. 2. Similar positioning of endobronchial valves. 3. Unchanged chest tube positioning. Electronically Signed    By: Monte Fantasia M.D.   On: 09/26/2018 09:34   Dg Chest Port 1 View  Result Date: 09/25/2018 CLINICAL DATA:  Followup right basilar pneumothorax with a chest tube in place. EXAM: PORTABLE CHEST 1 VIEW COMPARISON:  Yesterday. FINDINGS: Stable right chest tube. Very small right basilar pneumothorax without gross change. Stable biapical bullous changes and right surgical staples and vascular devices. Left basilar surgical staples are again demonstrated. No significant change in bilateral subcutaneous emphysema. Normal sized heart. Unremarkable bones. IMPRESSION: 1. No significant change in a less than 5% right basilar pneumothorax with a chest tube in place. 2. Stable changes of COPD. Electronically Signed   By: Claudie Revering M.D.   On: 09/25/2018 08:47        Scheduled Meds: . azithromycin  500 mg Oral Daily  . enoxaparin (LOVENOX) injection  40 mg Subcutaneous Q24H  . ethambutol  1,000 mg Oral Daily  . feeding supplement (ENSURE ENLIVE)  237 mL Per Tube TID BM  . fluticasone furoate-vilanterol  1 puff Inhalation Daily  . guaiFENesin  600 mg Oral BID  . HYDROmorphone   Intravenous Q4H  . Isavuconazonium Sulfate  372 mg Oral Daily  . levothyroxine  75 mcg Oral QAC breakfast  . multivitamin with minerals  1 tablet Oral Daily  . predniSONE  5 mg Oral Q breakfast  . senna-docusate  1 tablet Oral QHS  . umeclidinium bromide  1 puff Inhalation Daily   Continuous Infusions: .  lactated ringers 10 mL/hr at 09/22/18 2300  . potassium chloride       LOS: 23 days        Aline August, MD [facility] Pager [phone]  If 7PM-7AM, please contact night-coverage [email or web address] Password [facility] 09/27/2018, 7:43 AM

## 2018-09-27 NOTE — Progress Notes (Addendum)
[address]       ,[place] [address]             [phone]      8 Days Post-Op Procedure(s) (LRB): VIDEO BRONCHOSCOPY (N/A) INSERTION OF INTERBRONCHIAL VALVE (IBV) (N/A)   Subjective:  No new complaints.    Objective: Vital signs in last 24 hours: Temp:  [97.6 F (36.4 C)-98.4 F (36.9 C)] 97.9 F (36.6 C) (11/09 0840) Pulse Rate:  [65-81] 69 (11/09 0840) Cardiac Rhythm: Normal sinus rhythm (11/09 0345) Resp:  [13-24] 24 (11/09 0842) BP: (101-114)/(68-80) 113/79 (11/09 0840) SpO2:  [95 %-100 %] 97 % (11/09 0844) FiO2 (%):  [96 %] 96 % (11/08 1157)  Intake/Output from previous day: 11/08 0701 - 11/09 0700 In: 600 [P.O.:600] Out: 600 [Urine:550; Chest Tube:50] Intake/Output this shift: Total I/O In: 240 [P.O.:240] Out: 325 [Urine:325]  General appearance: alert, cooperative and no distress Heart: regular rate and rhythm Lungs: clear to auscultation bilaterally Abdomen: soft, non-tender; bowel sounds normal; no masses,  no organomegaly Extremities: extremities normal, atraumatic, no cyanosis or edema Wound: clean and dry  Lab Results: No results for input(s): WBC, HGB, HCT, PLT in the last 72 hours. BMET: No results for input(s): NA, K, CL, CO2, GLUCOSE, BUN, CREATININE, CALCIUM in the last 72 hours.  PT/INR: No results for input(s): LABPROT, INR in the last 72 hours. ABG    Component Value Date/Time   PHART 7.427 09/13/2018 0445   HCO3 28.4 (H) 09/13/2018 0445   TCO2 26 10/13/2015 0420   ACIDBASEDEF 2.0 12/22/2010 1522   O2SAT 99.2 09/13/2018 0445   CBG (last 3)  No results for input(s): GLUCAP in the last 72 hours.  Assessment/Plan: S/P Procedure(s) (LRB): VIDEO BRONCHOSCOPY (N/A) INSERTION OF INTERBRONCHIAL VALVE (IBV) (N/A)  1. Chest tube- + air leak with cough, this is small, CXR remains stable and unchanged- leave chest tube on suction, as patient has not tolerated water seal 2. Dispo- care per primary, repeat CXR in  AM   LOS: 23 days    Erin Barrett 09/27/2018 Patient seen and examined, agree with above  Remo Lipps C. Roxan Hockey, MD [facility] [phone]

## 2018-09-28 ENCOUNTER — Inpatient Hospital Stay (HOSPITAL_COMMUNITY): Payer: BLUE CROSS/BLUE SHIELD

## 2018-09-28 NOTE — Plan of Care (Signed)
  Problem: Education: Goal: Knowledge of General Education information will improve Description Including pain rating scale, medication(s)/side effects and non-pharmacologic comfort measures Outcome: Progressing   Problem: Clinical Measurements: Goal: Ability to maintain clinical measurements within normal limits will improve Outcome: Progressing Goal: Will remain free from infection Outcome: Progressing Goal: Diagnostic test results will improve Outcome: Progressing Goal: Respiratory complications will improve Outcome: Progressing Goal: Cardiovascular complication will be avoided Outcome: Progressing   Problem: Elimination: Goal: Will not experience complications related to bowel motility Outcome: Progressing Goal: Will not experience complications related to urinary retention Outcome: Progressing   Problem: Pain Managment: Goal: General experience of comfort will improve Outcome: Progressing   Problem: Safety: Goal: Ability to remain free from injury will improve Outcome: Progressing   Problem: Skin Integrity: Goal: Risk for impaired skin integrity will decrease Outcome: Progressing

## 2018-09-28 NOTE — H&P (View-Only) (Signed)
[address]       [place] [address]             [phone]      9 Days Post-Op Procedure(s) (LRB): VIDEO BRONCHOSCOPY (N/A) INSERTION OF INTERBRONCHIAL VALVE (IBV) (N/A)   Subjective:  No new complaints.  Doing okay.  Tolerates ambulating off suction.  Objective: Vital signs in last 24 hours: Temp:  [97.7 F (36.5 C)-98.2 F (36.8 C)] 98.2 F (36.8 C) (11/10 0500) Pulse Rate:  [62-81] 71 (11/10 0914) Cardiac Rhythm: Normal sinus rhythm (11/10 0700) Resp:  [16-28] 26 (11/10 0914) BP: (98-122)/(61-88) 122/88 (11/10 0500) SpO2:  [98 %-100 %] 98 % (11/10 0914)  Intake/Output from previous day: 11/09 0701 - 11/10 0700 In: 480 [P.O.:480] Out: 1325 [Urine:1275; Chest Tube:50]  General appearance: alert, cooperative and no distress Heart: regular rate and rhythm Lungs: clear to auscultation bilaterally Abdomen: soft, non-tender; bowel sounds normal; no masses,  no organomegaly Extremities: extremities normal, atraumatic, no cyanosis or edema Wound: clean and dry  Lab Results: No results for input(s): WBC, HGB, HCT, PLT in the last 72 hours. BMET: No results for input(s): NA, K, CL, CO2, GLUCOSE, BUN, CREATININE, CALCIUM in the last 72 hours.  PT/INR: No results for input(s): LABPROT, INR in the last 72 hours. ABG    Component Value Date/Time   PHART 7.427 09/13/2018 0445   HCO3 28.4 (H) 09/13/2018 0445   TCO2 26 10/13/2015 0420   ACIDBASEDEF 2.0 12/22/2010 1522   O2SAT 99.2 09/13/2018 0445   CBG (last 3)  No results for input(s): GLUCAP in the last 72 hours.  Assessment/Plan: S/P Procedure(s) (LRB): VIDEO BRONCHOSCOPY (N/A) INSERTION OF INTERBRONCHIAL VALVE (IBV) (N/A)  1. Chest tube- no air leak appreciated, however the water seal chamber is not full, as the pleurovac has been tipped over... Asked nursing to replace pleurovac.... CXR remains stable 2. Dispo- leave chest tube to suction, repeat CXR in AM, care per primary   LOS: 24  days    Erin Barrett 09/28/2018 pleuravac was replaced earlier. Still has an air leak with respirations, none with cough D/w Mr. Dobransky- will plan to go back to OR for repeat Bronchoscopy and possible additional IBV placement tomorrow He is aware of the indications, risks and benefits  Johnwilliam Shepperson C. Oden Lindaman, MD [facility] [phone]   

## 2018-09-28 NOTE — Progress Notes (Signed)
Patient ID: [name], male   DOB: 29-Sep-1976, 42 y.o.   MRN: [id number]  PROGRESS NOTE    [name]  PPI:[id number] DOB: [birth year] DOA: 09/04/2018 PCP: Wallene Dales, MD   Brief Narrative:  42 year old male with history of asthma, GERD, hypothyroidism, aspergilloma, left upper lobe segmentectomy for aspergilloma/MAI, bullous lung disease presented on 09/04/2018 with shortness of breath and was found to have tension pneumothorax.  CVTS was consulted, right-sided chest tube was placed urgently.  Pneumothorax resolved with chest tube, this was removed on 09/07/2018.  Patient was found to have worsening dyspnea on 09/08/2018 and repeat chest x-ray showed recurrent tension pneumothorax which required another urgent chest tube on 09/08/2018.  He underwent  VATS on 09/12/2018 by CVTS. Because of persistent air leak, he underwent intrabronchial valves on 09/19/2018 by CVTS.   Assessment & Plan:   Principal Problem:   Tension pneumothorax Active Problems:   Mycobacterium avium complex ([place])   Invasive pulmonary aspergillosis (HCC)   Severe persistent asthma   Hypothyroidism   Iron deficiency anemia   Protein-calorie malnutrition, severe   Recurrent tension pneumothorax -Chest tube placed urgently on 09/04/2018 by Dr. Roxan Hockey which was subsequently removed on 09/07/2018 ; repeat chest x-ray was stable without pneumothorax -Patient became dyspneic again on 09/08/2018 and developed another spontaneous tension pneumothorax which required chest tube placement again by Dr. Roxan Hockey -Status post VATS and stapling of blebs in the right lower and middle lobes by CVTS -Patient had persistent air leak following this for which he underwent intrabronchial valves on 09/19/2018 by CVTS -CVTS is managing chest tube -Respiratory status stable  History of MAI -Continue home regimen of ethambutol and azithromycin.  Outpatient follow-up by infectious disease  History of aspergilloma -History of  remote left upper lobe segmentectomy -Continue antifungal Cresemba -Outpatient follow-up with pulmonary and ID -continue prednisone 5 mg daily  History of severe persistent asthma -Stable.  Continue bronchodilators  Hypothyroidism -Continue Synthroid  Iron deficiency anemia -Hemoglobin stable.  Continue home iron supplement  Severe malnutrition -Follow nutrition recommendations    DVT prophylaxis: Heparin Code Status: Full Family Communication: None at bedside Disposition Plan: Home once cleared by CVTS  Consultants: CVTS  Procedures: Chest tube insertion x2; VATS; intrabronchial valves placement  Antimicrobials:   Ethambutol/Zithromax/Cresemba  Subjective: Patient seen and examined at bedside.  Patient denies worsening cough or shortness of breath.  He states that CVTS is planning to do more procedure next week.   Objective: Vitals:   09/27/18 1500 09/27/18 1934 09/27/18 2300 09/28/18 0500  BP: 98/61 105/72 106/77 122/88  Pulse: 81 67 64 62  Resp: (!) _0 Temp: 98 F (36.7 C) 98.2 F (36.8 C) 97.7 F (36.5 C) 98.2 F (36.8 C)  TempSrc: Oral Oral Oral Oral  SpO2: 100% 100% 99% 99%  Weight:      Height:        Intake/Output Summary (Last 24 hours) at 09/28/2018 0909 Last data filed at 09/28/2018 0700 Gross per 24 hour  Intake 240 ml  Output 1000 ml  Net -760 ml   Filed Weights   09/04/18 1330 09/19/18 0913  Weight: 51.3 kg 51.3 kg    Examination:  General exam: No acute distress. respiratory system: Bilateral decreased breath sounds at bases with some scattered crackles.  Right-sided chest tube present.   Cardiovascular system: S1 & S2 heard, Rate controlled Gastrointestinal system: Abdomen is nondistended, soft and nontender. Normal bowel sounds heard. Extremities: No cyanosis, edema   Data Reviewed:  I have personally reviewed following labs and imaging studies  CBC: Recent Labs  Lab 09/24/18 0253  WBC 9.6  HGB 14.9  HCT 44.3    MCV 96.5  PLT 696   Basic Metabolic Panel: Recent Labs  Lab 09/24/18 0253  NA 137  K 4.4  CL 100  CO2 31  GLUCOSE 92  BUN 8  CREATININE 0.75  CALCIUM 8.7*   GFR: Estimated Creatinine Clearance: 85.5 mL/min (by C-G formula based on SCr of 0.75 mg/dL). Liver Function Tests: No results for input(s): AST, ALT, ALKPHOS, BILITOT, PROT, ALBUMIN in the last 168 hours. No results for input(s): LIPASE, AMYLASE in the last 168 hours. No results for input(s): AMMONIA in the last 168 hours. Coagulation Profile: No results for input(s): INR, PROTIME in the last 168 hours. Cardiac Enzymes: No results for input(s): CKTOTAL, CKMB, CKMBINDEX, TROPONINI in the last 168 hours. BNP (last 3 results) No results for input(s): PROBNP in the last 8760 hours. HbA1C: No results for input(s): HGBA1C in the last 72 hours. CBG: No results for input(s): GLUCAP in the last 168 hours. Lipid Profile: No results for input(s): CHOL, HDL, LDLCALC, TRIG, CHOLHDL, LDLDIRECT in the last 72 hours. Thyroid Function Tests: No results for input(s): TSH, T4TOTAL, FREET4, T3FREE, THYROIDAB in the last 72 hours. Anemia Panel: No results for input(s): VITAMINB12, FOLATE, FERRITIN, TIBC, IRON, RETICCTPCT in the last 72 hours. Sepsis Labs: No results for input(s): PROCALCITON, LATICACIDVEN in the last 168 hours.  No results found for this or any previous visit (from the past 240 hour(s)).       Radiology Studies: Dg Chest Port 1 View  Result Date: 09/28/2018 CLINICAL DATA:  Follow-up pneumothorax. EXAM: PORTABLE CHEST 1 VIEW COMPARISON:  09/27/2018 and older studies. FINDINGS: No pneumothorax. Right chest tube is stable, lying along the pleural space of the right mid hemithorax. No change in marked upper lobe bullous emphysema, associated with underlying lung scarring. Stable right upper lung anastomosis staples. No new lung abnormalities. Extensive subcutaneous emphysema is unchanged. IMPRESSION: 1. No change  from the previous day's exam. 2. No convincing pneumothorax.  Stable right chest tube. 3. Stable extensive bilateral subcutaneous emphysema. Electronically Signed   By: Lajean Manes M.D.   On: 09/28/2018 07:47   Dg Chest Port 1 View  Result Date: 09/27/2018 CLINICAL DATA:  Spontaneous pneumothorax with chest tube in place. EXAM: PORTABLE CHEST 1 VIEW COMPARISON:  09/26/2018 and 09/08/2018 FINDINGS: Right-sided chest tube unchanged. No definite right-sided pneumothorax visualized. Postsurgical change and severe emphysematous disease unchanged. Cardiomediastinal silhouette unchanged. Moderate subcutaneous emphysema throughout the thorax unchanged. IMPRESSION: Severe emphysematous disease and postsurgical change which is stable. Right-sided chest tube unchanged. No definite pneumothorax visualized. Moderate subcutaneous emphysema over the thorax unchanged. Electronically Signed   By: Marin Olp M.D.   On: 09/27/2018 08:38        Scheduled Meds: . azithromycin  500 mg Oral Daily  . enoxaparin (LOVENOX) injection  40 mg Subcutaneous Q24H  . ethambutol  1,000 mg Oral Daily  . feeding supplement (ENSURE ENLIVE)  237 mL Per Tube TID BM  . fluticasone furoate-vilanterol  1 puff Inhalation Daily  . guaiFENesin  600 mg Oral BID  . HYDROmorphone   Intravenous Q4H  . Isavuconazonium Sulfate  372 mg Oral Daily  . levothyroxine  75 mcg Oral QAC breakfast  . multivitamin with minerals  1 tablet Oral Daily  . predniSONE  5 mg Oral Q breakfast  . senna-docusate  1 tablet Oral QHS  .  umeclidinium bromide  1 puff Inhalation Daily   Continuous Infusions: . lactated ringers 10 mL/hr at 09/22/18 2300  . potassium chloride       LOS: 24 days        Aline August, MD [facility] Pager [phone]  If 7PM-7AM, please contact night-coverage [email or web address] Password [email or web address] 09/28/2018, 9:09 AM

## 2018-09-28 NOTE — Progress Notes (Addendum)
[address]       [place] [address]             [phone]      9 Days Post-Op Procedure(s) (LRB): VIDEO BRONCHOSCOPY (N/A) INSERTION OF INTERBRONCHIAL VALVE (IBV) (N/A)   Subjective:  No new complaints.  Doing okay.  Tolerates ambulating off suction.  Objective: Vital signs in last 24 hours: Temp:  [97.7 F (36.5 C)-98.2 F (36.8 C)] 98.2 F (36.8 C) (11/10 0500) Pulse Rate:  [62-81] 71 (11/10 0914) Cardiac Rhythm: Normal sinus rhythm (11/10 0700) Resp:  [16-28] 26 (11/10 0914) BP: (98-122)/(61-88) 122/88 (11/10 0500) SpO2:  [98 %-100 %] 98 % (11/10 0914)  Intake/Output from previous day: 11/09 0701 - 11/10 0700 In: 480 [P.O.:480] Out: 1325 [Urine:1275; Chest Tube:50]  General appearance: alert, cooperative and no distress Heart: regular rate and rhythm Lungs: clear to auscultation bilaterally Abdomen: soft, non-tender; bowel sounds normal; no masses,  no organomegaly Extremities: extremities normal, atraumatic, no cyanosis or edema Wound: clean and dry  Lab Results: No results for input(s): WBC, HGB, HCT, PLT in the last 72 hours. BMET: No results for input(s): NA, K, CL, CO2, GLUCOSE, BUN, CREATININE, CALCIUM in the last 72 hours.  PT/INR: No results for input(s): LABPROT, INR in the last 72 hours. ABG    Component Value Date/Time   PHART 7.427 09/13/2018 0445   HCO3 28.4 (H) 09/13/2018 0445   TCO2 26 10/13/2015 0420   ACIDBASEDEF 2.0 12/22/2010 1522   O2SAT 99.2 09/13/2018 0445   CBG (last 3)  No results for input(s): GLUCAP in the last 72 hours.  Assessment/Plan: S/P Procedure(s) (LRB): VIDEO BRONCHOSCOPY (N/A) INSERTION OF INTERBRONCHIAL VALVE (IBV) (N/A)  1. Chest tube- no air leak appreciated, however the water seal chamber is not full, as the pleurovac has been tipped over... Asked nursing to replace pleurovac.... CXR remains stable 2. Dispo- leave chest tube to suction, repeat CXR in AM, care per primary   LOS: 24  days    Ellwood Handler 09/28/2018 pleuravac was replaced earlier. Still has an air leak with respirations, none with cough D/w Mr. Fife- will plan to go back to OR for repeat Bronchoscopy and possible additional IBV placement tomorrow He is aware of the indications, risks and benefits  Remo Lipps C. Roxan Hockey, MD [facility] [phone]

## 2018-09-29 ENCOUNTER — Encounter (HOSPITAL_COMMUNITY)
Admission: EM | Disposition: A | Discharge status: Home Health | Payer: Self-pay | Source: Home / Self Care | Attending: Internal Medicine

## 2018-09-29 ENCOUNTER — Inpatient Hospital Stay (HOSPITAL_COMMUNITY): Payer: BLUE CROSS/BLUE SHIELD

## 2018-09-29 ENCOUNTER — Inpatient Hospital Stay (HOSPITAL_COMMUNITY): Payer: BLUE CROSS/BLUE SHIELD | Admitting: Certified Registered Nurse Anesthetist

## 2018-09-29 ENCOUNTER — Encounter (HOSPITAL_COMMUNITY): Payer: Self-pay | Admitting: Certified Registered Nurse Anesthetist

## 2018-09-29 DIAGNOSIS — J95812 Postprocedural air leak: Secondary | ICD-10-CM

## 2018-09-29 HISTORY — PX: CHEST TUBE INSERTION: SHX231

## 2018-09-29 HISTORY — PX: VIDEO BRONCHOSCOPY WITH INSERTION OF INTERBRONCHIAL VALVE (IBV): SHX6178

## 2018-09-29 SURGERY — BRONCHOSCOPY, FLEXIBLE, WITH INTRABRONCHIAL VALVE INSERTION
Anesthesia: General | Site: Chest | Laterality: Right

## 2018-09-29 MED ORDER — SUGAMMADEX SODIUM 200 MG/2ML IV SOLN
INTRAVENOUS | Status: AC
  Filled 2018-09-29: qty 2

## 2018-09-29 MED ORDER — ROCURONIUM BROMIDE 50 MG/5ML IV SOSY
PREFILLED_SYRINGE | INTRAVENOUS | Status: AC
  Filled 2018-09-29: qty 5

## 2018-09-29 MED ORDER — ONDANSETRON HCL 4 MG/2ML IJ SOLN
INTRAMUSCULAR | Status: AC
  Filled 2018-09-29: qty 2

## 2018-09-29 MED ORDER — MIDAZOLAM HCL 2 MG/2ML IJ SOLN
INTRAMUSCULAR | Status: AC
  Filled 2018-09-29: qty 2

## 2018-09-29 MED ORDER — LIDOCAINE 2% (20 MG/ML) 5 ML SYRINGE
INTRAMUSCULAR | Status: AC
  Filled 2018-09-29: qty 5

## 2018-09-29 MED ORDER — 0.9 % SODIUM CHLORIDE (POUR BTL) OPTIME
TOPICAL | Status: DC | PRN
  Administered 2018-09-29: 1000 mL

## 2018-09-29 MED ORDER — DIPHENHYDRAMINE HCL 50 MG/ML IJ SOLN
INTRAMUSCULAR | Status: AC
  Filled 2018-09-29: qty 1

## 2018-09-29 MED ORDER — SUGAMMADEX SODIUM 200 MG/2ML IV SOLN
INTRAVENOUS | Status: DC | PRN
  Administered 2018-09-29: 102.6 mg via INTRAVENOUS

## 2018-09-29 MED ORDER — ONDANSETRON HCL 4 MG/2ML IJ SOLN
INTRAMUSCULAR | Status: DC | PRN
  Administered 2018-09-29: 4 mg via INTRAVENOUS

## 2018-09-29 MED ORDER — MEPERIDINE HCL 50 MG/ML IJ SOLN: 6.2500 mg | INTRAMUSCULAR | Status: DC | PRN

## 2018-09-29 MED ORDER — MIDAZOLAM HCL 5 MG/5ML IJ SOLN
INTRAMUSCULAR | Status: DC | PRN
  Administered 2018-09-29: 2 mg via INTRAVENOUS

## 2018-09-29 MED ORDER — PROPOFOL 10 MG/ML IV BOLUS
INTRAVENOUS | Status: DC | PRN
  Administered 2018-09-29: 150 mg via INTRAVENOUS

## 2018-09-29 MED ORDER — FENTANYL CITRATE (PF) 100 MCG/2ML IJ SOLN: 25.0000 ug | INTRAMUSCULAR | Status: DC | PRN

## 2018-09-29 MED ORDER — ROCURONIUM BROMIDE 50 MG/5ML IV SOSY
PREFILLED_SYRINGE | INTRAVENOUS | Status: DC | PRN
  Administered 2018-09-29: 40 mg via INTRAVENOUS

## 2018-09-29 MED ORDER — PROMETHAZINE HCL 25 MG/ML IJ SOLN: 6.2500 mg | INTRAMUSCULAR | Status: DC | PRN

## 2018-09-29 MED ORDER — FENTANYL CITRATE (PF) 250 MCG/5ML IJ SOLN
INTRAMUSCULAR | Status: AC
  Filled 2018-09-29: qty 5

## 2018-09-29 MED ORDER — LIDOCAINE 2% (20 MG/ML) 5 ML SYRINGE
INTRAMUSCULAR | Status: DC | PRN
  Administered 2018-09-29: 100 mg via INTRAVENOUS

## 2018-09-29 MED ORDER — FENTANYL CITRATE (PF) 100 MCG/2ML IJ SOLN
INTRAMUSCULAR | Status: DC | PRN
  Administered 2018-09-29: 50 ug via INTRAVENOUS
  Administered 2018-09-29: 100 ug via INTRAVENOUS

## 2018-09-29 MED ORDER — DEXAMETHASONE SODIUM PHOSPHATE 10 MG/ML IJ SOLN
INTRAMUSCULAR | Status: AC
  Filled 2018-09-29: qty 1

## 2018-09-29 SURGICAL SUPPLY — 38 items
ADAPTER VALVE BIOPSY EBUS (MISCELLANEOUS) IMPLANT
ADPTR VALVE BIOPSY EBUS (MISCELLANEOUS)
CANISTER SUCT 3000ML PPV (MISCELLANEOUS) ×4 IMPLANT
CATH BALLN 4FR (CATHETERS) ×2 IMPLANT
CATH LOADER DEPLOYMENT HUD (CATHETERS) ×2 IMPLANT
CONT SPEC 4OZ CLIKSEAL STRL BL (MISCELLANEOUS) ×4 IMPLANT
COVER BACK TABLE 60X90IN (DRAPES) ×4 IMPLANT
COVER WAND RF STERILE (DRAPES) ×4 IMPLANT
FILTER STRAW FLUID ASPIR (MISCELLANEOUS) IMPLANT
FORCEPS BIOP RJ4 1.8 (CUTTING FORCEPS) IMPLANT
GAUZE PETROLATUM 1 X8 (GAUZE/BANDAGES/DRESSINGS) ×2 IMPLANT
GAUZE SPONGE 4X4 12PLY STRL (GAUZE/BANDAGES/DRESSINGS) ×4 IMPLANT
GLOVE SURG SIGNA 7.5 PF LTX (GLOVE) ×4 IMPLANT
GOWN STRL REUS W/ TWL XL LVL3 (GOWN DISPOSABLE) ×2 IMPLANT
GOWN STRL REUS W/TWL XL LVL3 (GOWN DISPOSABLE) ×2
KIT AIRWAY SIZING W/B5-23 BALL (VALVE) ×2 IMPLANT
KIT CLEAN ENDO COMPLIANCE (KITS) ×4 IMPLANT
KIT TURNOVER KIT B (KITS) ×4 IMPLANT
MARKER SKIN DUAL TIP RULER LAB (MISCELLANEOUS) ×4 IMPLANT
NS IRRIG 1000ML POUR BTL (IV SOLUTION) ×4 IMPLANT
OIL SILICONE PENTAX (PARTS (SERVICE/REPAIRS)) IMPLANT
PAD ARMBOARD 7.5X6 YLW CONV (MISCELLANEOUS) ×8 IMPLANT
STOPCOCK MORSE 400PSI 3WAY (MISCELLANEOUS) ×4 IMPLANT
SYR 10ML LL (SYRINGE) ×4 IMPLANT
SYR 20ML ECCENTRIC (SYRINGE) ×4 IMPLANT
TAPE CLOTH SURG 4X10 WHT LF (GAUZE/BANDAGES/DRESSINGS) ×2 IMPLANT
TOWEL GREEN STERILE (TOWEL DISPOSABLE) ×4 IMPLANT
TOWEL GREEN STERILE FF (TOWEL DISPOSABLE) ×4 IMPLANT
TOWEL NATURAL 4PK STERILE (DISPOSABLE) ×4 IMPLANT
TRAP SPECIMEN MUCOUS 40CC (MISCELLANEOUS) ×4 IMPLANT
TUBE CONNECTING 20'X1/4 (TUBING) ×1
TUBE CONNECTING 20X1/4 (TUBING) ×3 IMPLANT
UNDERPAD 30X30 (UNDERPADS AND DIAPERS) ×4 IMPLANT
VALVE BIOPSY  SINGLE USE (MISCELLANEOUS) ×2
VALVE BIOPSY SINGLE USE (MISCELLANEOUS) ×2 IMPLANT
VALVE IN CARTRIDGE 9MM HUD (Valve) ×2 IMPLANT
VALVE SUCTION BRONCHIO DISP (MISCELLANEOUS) ×4 IMPLANT
WATER STERILE IRR 1000ML POUR (IV SOLUTION) ×4 IMPLANT

## 2018-09-29 NOTE — Anesthesia Postprocedure Evaluation (Signed)
Anesthesia Post Note  Patient: [name]  Procedure(s) Performed: VIDEO BRONCHOSCOPY WITH INSERTION OF INTERBRONCHIAL VALVE  (IBV) (N/A ) CHEST TUBE REPLACEMENT (Right Chest)     Patient location during evaluation: PACU Anesthesia Type: General Level of consciousness: sedated and patient cooperative Pain management: pain level controlled Vital Signs Assessment: post-procedure vital signs reviewed and stable Respiratory status: spontaneous breathing Cardiovascular status: stable Anesthetic complications: no    Last Vitals:  Vitals:   09/29/18 1100 09/29/18 1540  BP: 119/83 102/73  Pulse: 70   Resp:  (!) 25  Temp: 36.6 C 37.2 C  SpO2: 95% 95%    Last Pain:  Vitals:   09/29/18 1540  TempSrc: Oral  PainSc:                  [name]

## 2018-09-29 NOTE — Progress Notes (Signed)
RN verified the presence of a signed informed consent that matches stated procedure by patient. Verified armband matches patient's stated name and birth date. Verified NPO status and that all jewelry, contact, glasses, dentures, and partials had been removed (if applicable).

## 2018-09-29 NOTE — Progress Notes (Signed)
Patient ID: [name], male   DOB: June 24, 1976, 42 y.o.   MRN: [id number]  PROGRESS NOTE    [name]  TWK:[id number] DOB: Nov 04, 1976 DOA: 09/04/2018 PCP: Wallene Dales, MD   Brief Narrative:  42 year old male with history of asthma, GERD, hypothyroidism, aspergilloma, left upper lobe segmentectomy for aspergilloma/MAI, bullous lung disease presented on 09/04/2018 with shortness of breath and was found to have tension pneumothorax.  CVTS was consulted, right-sided chest tube was placed urgently.  Pneumothorax resolved with chest tube, this was removed on 09/07/2018.  Patient was found to have worsening dyspnea on 09/08/2018 and repeat chest x-ray showed recurrent tension pneumothorax which required another urgent chest tube on 09/08/2018.  He underwent  VATS on 09/12/2018 by CVTS. Because of persistent air leak, he underwent intrabronchial valves on 09/19/2018 by CVTS.   Assessment & Plan:   Principal Problem:   Tension pneumothorax Active Problems:   Mycobacterium avium complex ([place])   Invasive pulmonary aspergillosis (HCC)   Severe persistent asthma   Hypothyroidism   Iron deficiency anemia   Protein-calorie malnutrition, severe   Recurrent tension pneumothorax -Chest tube placed urgently on 09/04/2018 by Dr. Roxan Hockey which was subsequently removed on 09/07/2018 ; repeat chest x-ray was stable without pneumothorax -Patient became dyspneic again on 09/08/2018 and developed another spontaneous tension pneumothorax which required chest tube placement again by Dr. Roxan Hockey -Status post VATS and stapling of blebs in the right lower and middle lobes by CVTS -Patient had persistent air leak following this for which he underwent intrabronchial valves on 09/19/2018 by CVTS -CVTS is managing chest tube.  CVTS is planning for repeat bronchoscopy and possible additional endobronchial valve placement today. -Respiratory status stable  History of MAI -Continue home regimen of ethambutol  and azithromycin.  Outpatient follow-up by infectious disease  History of aspergilloma -History of remote left upper lobe segmentectomy -Continue antifungal Cresemba -Outpatient follow-up with pulmonary and ID -continue prednisone 5 mg daily  History of severe persistent asthma -Stable.  Continue bronchodilators  Hypothyroidism -Continue Synthroid  Iron deficiency anemia -Hemoglobin stable.  Continue home iron supplement  Severe malnutrition -Follow nutrition recommendations    DVT prophylaxis: Heparin Code Status: Full Family Communication: None at bedside Disposition Plan: Home once cleared by CVTS  Consultants: CVTS  Procedures: Chest tube insertion x2; VATS; intrabronchial valves placement  Antimicrobials:   Ethambutol/Zithromax/Cresemba  Subjective: Patient seen and examined at bedside.  No overnight fever, nausea, vomiting or worsening shortness of breath. Objective: Vitals:   09/29/18 0000 09/29/18 0500 09/29/18 0600 09/29/18 0745  BP:   103/69 (!) 92/57  Pulse:    66  Resp: (!) 25 14    Temp:   98.3 F (36.8 C) 98.5 F (36.9 C)  TempSrc:   Oral Oral  SpO2:      Weight:      Height:        Intake/Output Summary (Last 24 hours) at 09/29/2018 0833 Last data filed at 09/29/2018 0000 Gross per 24 hour  Intake 0 ml  Output 225 ml  Net -225 ml   Filed Weights   09/04/18 1330 09/19/18 0913  Weight: 51.3 kg 51.3 kg    Examination:  General exam: No  distress. respiratory system: Bilateral decreased breath sounds at bases with some scattered crackles, no wheezing.  Right-sided chest tube present.   Cardiovascular system: S1 & S2 heard, Rate controlled Gastrointestinal system: Abdomen is nondistended, soft and nontender. Normal bowel sounds heard. Extremities: No cyanosis, edema   Data Reviewed: I have  personally reviewed following labs and imaging studies  CBC: Recent Labs  Lab 09/24/18 0253  WBC 9.6  HGB 14.9  HCT 44.3  MCV 96.5  PLT 638    Basic Metabolic Panel: Recent Labs  Lab 09/24/18 0253  NA 137  K 4.4  CL 100  CO2 31  GLUCOSE 92  BUN 8  CREATININE 0.75  CALCIUM 8.7*   GFR: Estimated Creatinine Clearance: 85.5 mL/min (by C-G formula based on SCr of 0.75 mg/dL). Liver Function Tests: No results for input(s): AST, ALT, ALKPHOS, BILITOT, PROT, ALBUMIN in the last 168 hours. No results for input(s): LIPASE, AMYLASE in the last 168 hours. No results for input(s): AMMONIA in the last 168 hours. Coagulation Profile: No results for input(s): INR, PROTIME in the last 168 hours. Cardiac Enzymes: No results for input(s): CKTOTAL, CKMB, CKMBINDEX, TROPONINI in the last 168 hours. BNP (last 3 results) No results for input(s): PROBNP in the last 8760 hours. HbA1C: No results for input(s): HGBA1C in the last 72 hours. CBG: No results for input(s): GLUCAP in the last 168 hours. Lipid Profile: No results for input(s): CHOL, HDL, LDLCALC, TRIG, CHOLHDL, LDLDIRECT in the last 72 hours. Thyroid Function Tests: No results for input(s): TSH, T4TOTAL, FREET4, T3FREE, THYROIDAB in the last 72 hours. Anemia Panel: No results for input(s): VITAMINB12, FOLATE, FERRITIN, TIBC, IRON, RETICCTPCT in the last 72 hours. Sepsis Labs: No results for input(s): PROCALCITON, LATICACIDVEN in the last 168 hours.  No results found for this or any previous visit (from the past 240 hour(s)).       Radiology Studies: Dg Chest Port 1 View  Result Date: 09/29/2018 CLINICAL DATA:  Chest tube EXAM: PORTABLE CHEST 1 VIEW COMPARISON:  09/28/2018 FINDINGS: Right chest tube is stable and without right pneumothorax. Large apical blebs and bilateral scarring are stable. Upper normal heart size. Emphysema over the left chest wall persists. IMPRESSION: Stable right chest tube.  No definite pneumothorax. Electronically Signed   By: Marybelle Killings M.D.   On: 09/29/2018 07:43   Dg Chest Port 1 View  Result Date: 09/28/2018 CLINICAL DATA:  Follow-up  pneumothorax. EXAM: PORTABLE CHEST 1 VIEW COMPARISON:  09/27/2018 and older studies. FINDINGS: No pneumothorax. Right chest tube is stable, lying along the pleural space of the right mid hemithorax. No change in marked upper lobe bullous emphysema, associated with underlying lung scarring. Stable right upper lung anastomosis staples. No new lung abnormalities. Extensive subcutaneous emphysema is unchanged. IMPRESSION: 1. No change from the previous day's exam. 2. No convincing pneumothorax.  Stable right chest tube. 3. Stable extensive bilateral subcutaneous emphysema. Electronically Signed   By: Lajean Manes M.D.   On: 09/28/2018 07:47        Scheduled Meds: . [MAR Hold] azithromycin  500 mg Oral Daily  . [MAR Hold] enoxaparin (LOVENOX) injection  40 mg Subcutaneous Q24H  . [MAR Hold] ethambutol  1,000 mg Oral Daily  . [MAR Hold] feeding supplement (ENSURE ENLIVE)  237 mL Per Tube TID BM  . [MAR Hold] fluticasone furoate-vilanterol  1 puff Inhalation Daily  . [MAR Hold] guaiFENesin  600 mg Oral BID  . [MAR Hold] HYDROmorphone   Intravenous Q4H  . [MAR Hold] Isavuconazonium Sulfate  372 mg Oral Daily  . [MAR Hold] levothyroxine  75 mcg Oral QAC breakfast  . [MAR Hold] multivitamin with minerals  1 tablet Oral Daily  . [MAR Hold] predniSONE  5 mg Oral Q breakfast  . [MAR Hold] senna-docusate  1 tablet Oral QHS  . [  MAR Hold] umeclidinium bromide  1 puff Inhalation Daily   Continuous Infusions: . lactated ringers 10 mL/hr at 09/22/18 2300  . [MAR Hold] potassium chloride       LOS: 25 days        Aline August, MD [facility] Pager [phone]  If 7PM-7AM, please contact night-coverage [email or web address] Password [facility] 09/29/2018, 8:33 AM

## 2018-09-29 NOTE — Progress Notes (Signed)
Signed         Pt arrived to PACU and on assessment pt presented with subcutaneous emphysema from diaphragm to lower jaw. Dr. Roxan Hockey made aware and present at bedside. MD ordered STAT Chest Xray.   After calling report to receiving RN on [facility] assessed pt before leaving PACU. Writer listened to breath sounds, subcutaneous emphysema noted no new change and assessed chest tube site and chest. No new changes were present. Pt transported to [facility] and as Probation officer was hooking pt up to vital sign machine noticed a significant change to right chest subcutaneous emphysema. Receiving RN updated and assessed pt as well. Dr. Roxan Hockey paged and Tammy, RN talked to MD. Dr. Roxan Hockey updated and made aware of new findings.

## 2018-09-29 NOTE — Interval H&P Note (Signed)
History and Physical Interval Note:  09/29/2018 8:12 AM  [name]  has presented today for surgery, with the diagnosis of air leak  The various methods of treatment have been discussed with the patient and family. After consideration of risks, benefits and other options for treatment, the patient has consented to  Procedure(s): VIDEO BRONCHOSCOPY WITH INSERTION OF INTERBRONCHIAL VALVE (IBV) (N/A) as a surgical intervention .  The patient's history has been reviewed, patient examined, no change in status, stable for surgery.  I have reviewed the patient's chart and labs.  Questions were answered to the patient's satisfaction.     Melrose Nakayama

## 2018-09-29 NOTE — Brief Op Note (Signed)
09/29/2018  10:10 AM  PATIENT:  [name]  42 y.o. male  PRE-OPERATIVE DIAGNOSIS:  air leak  POST-OPERATIVE DIAGNOSIS:  air leak  PROCEDURE:  Procedure(s): VIDEO BRONCHOSCOPY WITH INSERTION OF INTERBRONCHIAL VALVE  (IBV) (N/A) CHEST TUBE REPLACEMENT (Right)  SURGEON:  Surgeon(s) and Role:    * Melrose Nakayama, MD - Primary  PHYSICIAN ASSISTANT:   ASSISTANTS: none   ANESTHESIA:   general  EBL:  none  BLOOD ADMINISTERED:none  DRAINS: 1 Chest Tube(s) in the right   LOCAL MEDICATIONS USED:  NONE  SPECIMEN:  No Specimen  DISPOSITION OF SPECIMEN:  N/A  COUNTS:  NO endoscopic  TOURNIQUET:  * No tourniquets in log *  DICTATION: .Other Dictation: Dictation Number -  PLAN OF CARE: return to inpatient  PATIENT DISPOSITION:  PACU - hemodynamically stable.   Delay start of Pharmacological VTE agent (>24hrs) due to surgical blood loss or risk of bleeding: no

## 2018-09-29 NOTE — Progress Notes (Signed)
[address]       [place] [address]             [phone]      CTSP for increased SQ emphysema BP 111/83 (BP Location: Right Arm)   Pulse 75   Temp 98.1 F (36.7 C)   Resp 15   Ht 5' 7.01" (1.702 m)   Wt 51.3 kg   SpO2 99%   BMI 17.69 kg/m  No shortness of breath or pain CT in good position. Air leak larger than preop may be function of previous CT having a fibrin clot in it Postop CXR OK No intervention needed at this time  Remo Lipps C. Roxan Hockey, MD [facility] [phone]

## 2018-09-29 NOTE — Anesthesia Procedure Notes (Signed)
Procedure Name: Intubation Date/Time: 09/29/2018 9:11 AM Performed by: Alain Marion, CRNA Pre-anesthesia Checklist: Patient identified, Emergency Drugs available, Suction available and Patient being monitored Patient Re-evaluated:Patient Re-evaluated prior to induction Oxygen Delivery Method: Circle System Utilized Preoxygenation: Pre-oxygenation with 100% oxygen Induction Type: IV induction Ventilation: Mask ventilation without difficulty Laryngoscope Size: Miller and 2 Grade View: Grade I Tube type: Oral Tube size: 8.5 mm Number of attempts: 1 Airway Equipment and Method: Stylet and Oral airway Placement Confirmation: ETT inserted through vocal cords under direct vision,  positive ETCO2 and breath sounds checked- equal and bilateral Secured at: 22 cm Tube secured with: Tape Dental Injury: Teeth and Oropharynx as per pre-operative assessment

## 2018-09-29 NOTE — Transfer of Care (Signed)
Immediate Anesthesia Transfer of Care Note  Patient: [name]  Procedure(s) Performed: VIDEO BRONCHOSCOPY WITH INSERTION OF INTERBRONCHIAL VALVE  (IBV) (N/A ) CHEST TUBE REPLACEMENT (Right Chest)  Patient Location: PACU  Anesthesia Type:General  Level of Consciousness: awake, alert  and oriented  Airway & Oxygen Therapy: Patient Spontanous Breathing and Patient connected to nasal cannula oxygen  Post-op Assessment: Report given to RN and Post -op Vital signs reviewed and stable  Post vital signs: Reviewed and stable  Last Vitals:  Vitals Value Taken Time  BP 159/91 09/29/2018  9:59 AM  Temp    Pulse 89 09/29/2018 10:01 AM  Resp 24 09/29/2018 10:01 AM  SpO2 100 % 09/29/2018 10:01 AM  Vitals shown include unvalidated device data.  Last Pain:  Vitals:   09/29/18 0745  TempSrc: Oral  PainSc: 0-No pain      Patients Stated Pain Goal: 2 (58/94/83 4758)  Complications: No apparent anesthesia complications

## 2018-09-29 NOTE — Anesthesia Preprocedure Evaluation (Signed)
Anesthesia Evaluation  Patient identified by MRN, date of birth, ID band Patient awake    Reviewed: Allergy & Precautions, H&P , NPO status , Patient's Chart, lab work & pertinent test results  Airway Mallampati: II  TM Distance: >3 FB Neck ROM: Full    Dental no notable dental hx. (+) Teeth Intact, Dental Advisory Given   Pulmonary shortness of breath, asthma , COPD, former smoker,    Pulmonary exam normal  + decreased breath sounds      Cardiovascular negative cardio ROS   Rhythm:Regular Rate:Normal     Neuro/Psych negative neurological ROS  negative psych ROS   GI/Hepatic Neg liver ROS, GERD  ,  Endo/Other  Hypothyroidism   Renal/GU negative Renal ROS     Musculoskeletal   Abdominal   Peds  Hematology negative hematology ROS (+) anemia ,   Anesthesia Other Findings   Reproductive/Obstetrics negative OB ROS                             Anesthesia Physical  Anesthesia Plan  ASA: III  Anesthesia Plan: General   Post-op Pain Management:    Induction: Intravenous  PONV Risk Score and Plan: 3 and Ondansetron, Dexamethasone and Midazolam  Airway Management Planned: Oral ETT  Additional Equipment: None  Intra-op Plan:   Post-operative Plan: Extubation in OR  Informed Consent: I have reviewed the patients History and Physical, chart, labs and discussed the procedure including the risks, benefits and alternatives for the proposed anesthesia with the patient or authorized representative who has indicated his/her understanding and acceptance.   Dental advisory given  Plan Discussed with: CRNA  Anesthesia Plan Comments:         Anesthesia Quick Evaluation

## 2018-09-30 ENCOUNTER — Encounter (HOSPITAL_COMMUNITY): Payer: Self-pay | Admitting: Thoracic Surgery (Cardiothoracic Vascular Surgery)

## 2018-09-30 MED ORDER — ENSURE ENLIVE PO LIQD
237.0000 mL | Freq: Three times a day (TID) | ORAL | Status: DC
  Administered 2018-09-30 – 2018-10-06 (×14): 237 mL via ORAL

## 2018-09-30 NOTE — Plan of Care (Signed)
Working on walking throughout the day. Discussed plan of pain management with new CT in place.

## 2018-09-30 NOTE — Progress Notes (Signed)
Patient ID: [name], male   DOB: [birth year], 42 y.o.   MRN: [id number]  PROGRESS NOTE    [name]  DUK:[id number] DOB: 1976-01-26 DOA: 09/04/2018 PCP: Wallene Dales, MD   Brief Narrative:  42 year old male with history of asthma, GERD, hypothyroidism, aspergilloma, left upper lobe segmentectomy for aspergilloma/MAI, bullous lung disease presented on 09/04/2018 with shortness of breath and was found to have tension pneumothorax.  CVTS was consulted, right-sided chest tube was placed urgently.  Pneumothorax resolved with chest tube, this was removed on 09/07/2018.  Patient was found to have worsening dyspnea on 09/08/2018 and repeat chest x-ray showed recurrent tension pneumothorax which required another urgent chest tube on 09/08/2018.  He underwent  VATS on 09/12/2018 by CVTS. Because of persistent air leak, he underwent interbronchial valves on 09/19/2018 by CVTS.   Assessment & Plan:   Principal Problem:   Tension pneumothorax Active Problems:   Mycobacterium avium complex ([place])   Invasive pulmonary aspergillosis (HCC)   Severe persistent asthma   Hypothyroidism   Iron deficiency anemia   Protein-calorie malnutrition, severe   Recurrent tension pneumothorax -Chest tube placed urgently on 09/04/2018 by Dr. Roxan Hockey which was subsequently removed on 09/07/2018 ; repeat chest x-ray was stable without pneumothorax -Patient became dyspneic again on 09/08/2018 and developed another spontaneous tension pneumothorax which required chest tube placement again by Dr. Roxan Hockey -Status post VATS and stapling of blebs in the right lower and middle lobes by CVTS -Patient had persistent air leak following this for which he underwent intrabronchial valves on 09/19/2018 by CVTS -CVTS is managing chest tube.  Status post bronchoscopy and endobronchial valve placement again along with extensive right pleural chest tube on 09/29/2018 by CVTS. -Respiratory status stable  History of  MAI -Continue home regimen of ethambutol and azithromycin.  Outpatient follow-up by infectious disease  History of aspergilloma -History of remote left upper lobe segmentectomy -Continue antifungal Cresemba -Outpatient follow-up with pulmonary and ID -continue prednisone 5 mg daily  History of severe persistent asthma -Stable.  Continue bronchodilators  Hypothyroidism -Continue Synthroid  Iron deficiency anemia -Hemoglobin stable.  Continue home iron supplement  Severe malnutrition -Follow nutrition recommendations    DVT prophylaxis: Heparin Code Status: Full Family Communication: None at bedside Disposition Plan: Home once cleared by CVTS  Consultants: CVTS  Procedures: Chest tube insertion x2; VATS; interbronchial valves placement  Antimicrobials:   Ethambutol/Zithromax/Cresemba  Subjective: Patient seen and examined at bedside.  No worsening shortness of breath or chest pain.  No overnight fever.   Objective: Vitals:   09/30/18 0000 09/30/18 0300 09/30/18 0400 09/30/18 0726  BP: 99/77 127/86    Pulse:  81    Resp: (!) _0 Temp: 98.5 F (36.9 C) 98 F (36.7 C)    TempSrc: Oral Oral    SpO2:    96%  Weight:      Height:        Intake/Output Summary (Last 24 hours) at 09/30/2018 0951 Last data filed at 09/30/2018 0600 Gross per 24 hour  Intake 615 ml  Output 1065 ml  Net -450 ml   Filed Weights   09/04/18 1330 09/19/18 0913  Weight: 51.3 kg 51.3 kg    Examination:  General exam: No acute distress. respiratory system: Bilateral decreased breath sounds at bases with scattered crackles.  Right-sided chest tube present.   Cardiovascular system: S1 & S2 heard, Rate controlled Gastrointestinal system: Abdomen is nondistended, soft and nontender. Normal bowel sounds heard. Extremities: No cyanosis, edema  Data Reviewed: I have personally reviewed following labs and imaging studies  CBC: Recent Labs  Lab 09/24/18 0253  WBC 9.6  HGB 14.9   HCT 44.3  MCV 96.5  PLT 696   Basic Metabolic Panel: Recent Labs  Lab 09/24/18 0253  NA 137  K 4.4  CL 100  CO2 31  GLUCOSE 92  BUN 8  CREATININE 0.75  CALCIUM 8.7*   GFR: Estimated Creatinine Clearance: 85.5 mL/min (by C-G formula based on SCr of 0.75 mg/dL). Liver Function Tests: No results for input(s): AST, ALT, ALKPHOS, BILITOT, PROT, ALBUMIN in the last 168 hours. No results for input(s): LIPASE, AMYLASE in the last 168 hours. No results for input(s): AMMONIA in the last 168 hours. Coagulation Profile: No results for input(s): INR, PROTIME in the last 168 hours. Cardiac Enzymes: No results for input(s): CKTOTAL, CKMB, CKMBINDEX, TROPONINI in the last 168 hours. BNP (last 3 results) No results for input(s): PROBNP in the last 8760 hours. HbA1C: No results for input(s): HGBA1C in the last 72 hours. CBG: No results for input(s): GLUCAP in the last 168 hours. Lipid Profile: No results for input(s): CHOL, HDL, LDLCALC, TRIG, CHOLHDL, LDLDIRECT in the last 72 hours. Thyroid Function Tests: No results for input(s): TSH, T4TOTAL, FREET4, T3FREE, THYROIDAB in the last 72 hours. Anemia Panel: No results for input(s): VITAMINB12, FOLATE, FERRITIN, TIBC, IRON, RETICCTPCT in the last 72 hours. Sepsis Labs: No results for input(s): PROCALCITON, LATICACIDVEN in the last 168 hours.  No results found for this or any previous visit (from the past 240 hour(s)).       Radiology Studies: Dg Chest Port 1 View  Result Date: 09/29/2018 CLINICAL DATA:  Follow-up chest tube repositioning EXAM: PORTABLE CHEST 1 VIEW COMPARISON:  Film from earlier in the same day. FINDINGS: Cardiac shadow is stable. Aortic calcifications are again seen. Large apical blebs are again identified. Diffuse scarring is noted bilaterally and stable. Multiple endobronchial plugs are noted on the right. Previously seen chest tube is been removed in the interval and replaced with a new chest tube in similar  positioning. No definitive pneumothorax is noted at this time. Considerable subcutaneous air remains. No bony abnormality is noted. No infiltrate is seen. IMPRESSION: Status post chest tube exchange. No significant pneumothorax is noted. The remainder of the exam is stable from the prior study. Electronically Signed   By: Inez Catalina M.D.   On: 09/29/2018 10:49   Dg Chest Port 1 View  Result Date: 09/29/2018 CLINICAL DATA:  Chest tube EXAM: PORTABLE CHEST 1 VIEW COMPARISON:  09/28/2018 FINDINGS: Right chest tube is stable and without right pneumothorax. Large apical blebs and bilateral scarring are stable. Upper normal heart size. Emphysema over the left chest wall persists. IMPRESSION: Stable right chest tube.  No definite pneumothorax. Electronically Signed   By: Marybelle Killings M.D.   On: 09/29/2018 07:43        Scheduled Meds: . azithromycin  500 mg Oral Daily  . enoxaparin (LOVENOX) injection  40 mg Subcutaneous Q24H  . ethambutol  1,000 mg Oral Daily  . feeding supplement (ENSURE ENLIVE)  237 mL Per Tube TID BM  . fluticasone furoate-vilanterol  1 puff Inhalation Daily  . guaiFENesin  600 mg Oral BID  . HYDROmorphone   Intravenous Q4H  . Isavuconazonium Sulfate  372 mg Oral Daily  . levothyroxine  75 mcg Oral QAC breakfast  . multivitamin with minerals  1 tablet Oral Daily  . predniSONE  5 mg Oral Q breakfast  .  senna-docusate  1 tablet Oral QHS  . umeclidinium bromide  1 puff Inhalation Daily   Continuous Infusions: . lactated ringers 10 mL/hr at 09/22/18 2300  . potassium chloride       LOS: 26 days        Aline August, MD [facility] Pager [phone]  If 7PM-7AM, please contact night-coverage [email or web address] Password [facility] 09/30/2018, 9:51 AM

## 2018-09-30 NOTE — Progress Notes (Signed)
Nutrition Follow-up  DOCUMENTATION CODES:   Severe malnutrition in context of chronic illness, Underweight  INTERVENTION:   -Continue MVI with minerals daily -Continue Ensure Enlive po TID, each supplement provides 350 kcal and 20 grams of protein  NUTRITION DIAGNOSIS:   Severe Malnutrition related to chronic illness(bullous lung disease ) as evidenced by severe fat depletion, severe muscle depletion.  Ongoing  GOAL:   Patient will meet greater than or equal to 90% of their needs  Progressing  MONITOR:   PO intake, Supplement acceptance, Labs, Skin, Weight trends, I & O's  REASON FOR ASSESSMENT:   Malnutrition Screening Tool    ASSESSMENT:   42 yo Male with PMH of MAI, bullous lung disease, left upper lobe segmentectomy for aspergilloma, reflux and hypothyroidism. He presented to [facility] after noting onset of shortness of breath.  10/25- s/pProcedure(s): VIDEO ASSISTED THORACOSCOPY (Right),STAPLING OF BLEBS (Right) 11/1- s/pProcedure(s): VIDEO BRONCHOSCOPY (N/A),INSERTION OF INTERBRONCHIAL VALVE (IBV) (N/A) 11/11- s/p video bronchoscopy with insertion of interbronchial valve  Reviewed I/O's: -50 ml x 24 hours and -2.4 L since 09/16/18  Chest tube output: 40 ml x 24 hours  Pt resting quietly in recliner chair, watching TV at time of visit.   Pt's intake has improved greatly since last visit. Noted meal completion 100%. Pt is also taking his Ensure supplements per MAR, consuming 3 per day on average.  No new weight since 09/19/18.   Labs reviewed.   Diet Order:   Diet Order            DIET SOFT Room service appropriate? Yes; Fluid consistency: Thin  Diet effective now              EDUCATION NEEDS:   No education needs have been identified at this time  Skin:  Skin Assessment: Skin Integrity Issues: Skin Integrity Issues:: Incisions Incisions: closed rt chest, chest tube  Last BM:  09/28/18  Height:   Ht Readings from Last 1 Encounters:   09/19/18 5' 7.01" (1.702 m)    Weight:   Wt Readings from Last 1 Encounters:  09/19/18 51.3 kg    Ideal Body Weight:  67.3 kg  BMI:  Body mass index is 17.69 kg/m.  Estimated Nutritional Needs:   Kcal:  1700-1900  Protein:  80-95 gm  Fluid:  1.7-1.9 L    Haneen Bernales A. Jimmye Norman, RD, LDN, CDE Pager: [phone] After hours Pager: [phone]

## 2018-09-30 NOTE — Op Note (Signed)
NAME: [name], [name] MEDICAL RECORD [id number] ACCOUNT [id number] DATE OF BIRTH:1976-07-29 FACILITY: [facility] LOCATION: [facility] PHYSICIAN:Rayshad Riviello Chaya Jan, MD  OPERATIVE REPORT  DATE OF PROCEDURE:  09/29/2018  PREOPERATIVE DIAGNOSIS:  Persistent air leak after a bleb stapling.  POSTOPERATIVE DIAGNOSIS:  Persistent air leak after a bleb stapling.  PROCEDURE:  Bronchoscopy with endobronchial valve placement in right middle lobe bronchus and exchange of right pleural chest tube.  SURGEON:  Modesto Charon, MD  ASSISTANT:  None.  ANESTHESIA:  General.  FINDINGS:  All IBVs well seated.  CLINICAL NOTE:  The patient is a 42 year old man with chronic Mycobacterium avium complex infection and severe bullous lung disease.  He presented with a spontaneous pneumothorax and ultimately required VATS for bleb resection.  He had multiple blebs and  a very emphysematous lung.  He had a large air leak postoperatively and intrabronchial valves were placed in the right lower lobe bronchi, which resulted in a decrease in the size of the leak, but he still has a persistent small air leak.  He was advised  to undergo a relook to ensure that all valves were well seated and if they were, the plan would be to place a valve in the right middle lobe bronchus.  We also discussed changing out the chest tube, which had been in place for a long time and had some  fibrinous debris in the tubing.  He understood and accepted the risks and agreed to proceed.  OPERATIVE NOTE:  The patient was brought to the operating room on 09/29/2018.  He had induction of general anesthesia and was intubated.  Flexible fiberoptic bronchoscopy was performed via the endotracheal tube and all of the intrabronchial valves that had  previously been placed in the lower lobe bronchi were well seated and intact.  The right middle lobe bronchus sized for a 9 mm valve and the valve was deployed.  The bronchoscope was removed.  The chest tube  was removed from the right chest.  The chest  was prepped and draped in the usual sterile fashion.  A 28 French tube was advanced through the previous incision and secured with a #1 silk suture.  It was hooked to suction.  The patient was then extubated in the operating room and taken to the  [facility] in good condition.  TN/NUANCE  D:09/29/2018 T:09/30/2018 JOB:[id number]

## 2018-10-01 ENCOUNTER — Inpatient Hospital Stay (HOSPITAL_COMMUNITY): Payer: BLUE CROSS/BLUE SHIELD

## 2018-10-01 NOTE — Progress Notes (Signed)
[address]       [place] [address]             [phone]      2 Days Post-Op Procedure(s) (LRB): VIDEO BRONCHOSCOPY WITH INSERTION OF INTERBRONCHIAL VALVE  (IBV) (N/A) CHEST TUBE REPLACEMENT (Right) Subjective: Feels okay today. He does have some increased pain around the chest tube site. Controlled with oral medications.  Objective: Vital signs in last 24 hours: Temp:  [97.5 F (36.4 C)-98.5 F (36.9 C)] 98.4 F (36.9 C) (11/13 0745) Pulse Rate:  [65-95] 72 (11/13 0745) Cardiac Rhythm: Normal sinus rhythm (11/13 0702) Resp:  [14-24] 16 (11/13 0745) BP: (103-125)/(68-92) 125/79 (11/13 0745) SpO2:  [90 %-96 %] 93 % (11/13 0745)     Intake/Output from previous day: 11/12 0701 - 11/13 0700 In: 840 [P.O.:840] Out: 768 [Urine:750; Chest Tube:18] Intake/Output this shift: No intake/output data recorded.  General appearance: alert, cooperative and no distress Heart: regular rate and rhythm, S1, S2 normal, no murmur, click, rub or gallop Lungs: clear to auscultation bilaterally Abdomen: soft, non-tender; bowel sounds normal; no masses,  no organomegaly Extremities: extremities normal, atraumatic, no cyanosis or edema Wound: clean and dry  Lab Results: No results for input(s): WBC, HGB, HCT, PLT in the last 72 hours. BMET: No results for input(s): NA, K, CL, CO2, GLUCOSE, BUN, CREATININE, CALCIUM in the last 72 hours.  PT/INR: No results for input(s): LABPROT, INR in the last 72 hours. ABG    Component Value Date/Time   PHART 7.427 09/13/2018 0445   HCO3 28.4 (H) 09/13/2018 0445   TCO2 26 10/13/2015 0420   ACIDBASEDEF 2.0 12/22/2010 1522   O2SAT 99.2 09/13/2018 0445   CBG (last 3)  No results for input(s): GLUCAP in the last 72 hours.  Assessment/Plan: S/P Procedure(s) (LRB): VIDEO BRONCHOSCOPY WITH INSERTION OF INTERBRONCHIAL VALVE  (IBV) (N/A) CHEST TUBE REPLACEMENT (Right)  1. NSR and BP well controlled 2. Pulm-continuous air leak  in the chest tube atrium. Keep on suction today. Ordered a CXR for this morning.  3. Renal-creatinine 0.75, electrolytes okay 4. Malnutrition-continue ensure protein shakes. Nutrition following and assisting 5. Medical care per primary  Plan: Continue chest tube on suction. Ambulate as able. Encouraged use of incentive spirometer.     LOS: 27 days    [name] 10/01/2018

## 2018-10-01 NOTE — Progress Notes (Signed)
[address]       [place] [address]   Today's Vitals   10/01/18 0742 10/01/18 0745 10/01/18 1122 10/01/18 1329  BP:  125/79    Pulse:  72    Resp:  16    Temp:  98.4 F (36.9 C)    TempSrc:  Oral    SpO2: 94% 93%    Weight:      Height:      PainSc:  _0 Body mass index is 17.69 kg/m.   Asked to see the patient by nursing staff after changing his tube system.  He was concerned there is no longer tidaling or any evidence of air leak. All connections checked and appear to be normal. No air leak noted with cough.  PE  Lungs: dim in lower right fields Sub q air noted( reportedly better)  Cor: RRR   A/P: Clinically stable with good oxygen saturations on room air.  He denies shortness of breath.  Due to concerns over change in chest tube status we will get a portable chest x-ray just to make sure there been no significant new findings.

## 2018-10-01 NOTE — Progress Notes (Signed)
Connector from Pleur-Vac to CT changed per order of Dr. Roxan Hockey.  Connection sealed w/tape in vertical position then wrapped around.  Air leak went from 1-2+ to no air leak.  Patient sats 93% on RA at this time.  No SOB; able to cough and deep breathe.

## 2018-10-01 NOTE — Progress Notes (Signed)
No tidaling noted in leak chamber of Pleur-Vac.  Patient continues to breathe easy, no distress noted; diminished in right base; subcutaneous air is improving; however, no tidaling with respirations.  Physician/PA notified.

## 2018-10-01 NOTE — Progress Notes (Signed)
CT dressing taken down to entry site; no complications.  CT remains without air leak.  PA at bedside for eval also.  Clinically, patient is very stable.  PCXR ordered for comparison to AM CXR to ensure no significant changes.

## 2018-10-01 NOTE — Progress Notes (Signed)
[facility] PROGRESS NOTE  [name] OVF:[id number] DOB: Nov 01, 1976 DOA: 09/04/2018 PCP: Wallene Dales, MD  Brief summary   42 year old male with history of asthma, GERD, hypothyroidism, aspergilloma, left upper lobe segmentectomy for aspergilloma/MAI, bullous lung disease presented on 09/04/2018 with shortness of breath and was found to have tension pneumothorax.  CVTS was consulted, right-sided chest tube was placed urgently.  Pneumothorax resolved with chest tube, this was removed on 09/07/2018.  Patient was found to have worsening dyspnea on 09/08/2018 and repeat chest x-ray showed recurrent tension pneumothorax which required another urgent chest tube on 09/08/2018.  He underwent  VATS on 09/12/2018 by CVTS. Because of persistent air leak, he underwent interbronchial valves on 09/19/2018 by CVTS. Then again on 11/12: Bronchoscopy with endobronchial valve placement in right middle lobe bronchus and exchange of right pleural chest tube   Assessment/Plan:  Recurrent tension pneumothorax -Chest tube placed urgently on 09/04/2018 by Dr. Roxan Hockey which was subsequently removed on 09/07/2018 ; repeat chest x-ray was stable without pneumothorax -Patient became dyspneic again on 09/08/2018 and developed another spontaneous tension pneumothorax which required chest tube placement again by Dr. Roxan Hockey -Underwent VATS and stapling of blebs in the right lower and middle lobes by CVTS -Patient had persistent air leak following this for which he underwent intrabronchial valves on 09/19/2018 by CVTS -Status post bronchoscopy and endobronchial valve placement again along with extensive right pleural chest tube on 09/29/2018 by CVTS. -persistent air leak. CVTS is managing chest tube. Cont per ct surgery, appreciate input   History of MAI. Continue home regimen of ethambutol and azithromycin.  Outpatient follow-up by infectious disease  History of aspergilloma. History of remote left upper  lobe segmentectomy. Continue antifungal Cresemba. Outpatient follow-up with pulmonary and ID. continue prednisone 5 mg daily  History of severe persistent asthma. Stable.  Continue bronchodilators  Hypothyroidism. Continue Synthroid  Iron deficiency anemia. Hemoglobin stable.  Continue home iron supplement  Severe malnutrition. Follow nutrition recommendations   Code Status: full Family Communication: d/w patient, RN (indicate person spoken with, relationship, and if by phone, the number) Disposition Plan: pend clinical improvement    Consultants:  CT surgery   Procedures: Chest tube insertion x2; VATS; interbronchial valves placement  Antibiotics: Anti-infectives (From admission, onward)   Start     Dose/Rate Route Frequency Ordered Stop   09/20/18 0600  cefUROXime (ZINACEF) 1.5 g in sodium chloride 0.9 % 100 mL IVPB  Status:  Discontinued     1.5 g 200 mL/hr over 30 Minutes Intravenous On call to O.R. 09/19/18 0936 09/19/18 1410   09/14/18 1030  azithromycin (ZITHROMAX) tablet 500 mg     500 mg Oral Daily 09/14/18 1022     09/12/18 1700  ceFAZolin (ANCEF) IVPB 2g/100 mL premix     2 g 200 mL/hr over 30 Minutes Intravenous Every 8 hours 09/12/18 1544 09/13/18 0106   09/12/18 0645  ceFAZolin (ANCEF) IVPB 2g/100 mL premix     2 g 200 mL/hr over 30 Minutes Intravenous 30 min pre-op 09/11/18 2006 09/12/18 0837   09/05/18 1000  azithromycin (ZITHROMAX) tablet 500 mg  Status:  Discontinued     500 mg Oral Daily 09/05/18 0115 09/12/18 1544   09/05/18 1000  ethambutol (MYAMBUTOL) tablet 1,000 mg    Note to Pharmacy:  TAKE TWO AND ONE-HALF TABLETS BY MOUTH ONCE DAILY     1,000 mg Oral Daily 09/05/18 0115     09/05/18 1000  Isavuconazonium Sulfate CAPS 372 mg     372 mg  Oral Daily 09/05/18 0115          (indicate start date, and stop date if known)  HPI/Subjective: Reports feeling "okay". No distress. Still air leak persistent.   Objective: Vitals:   10/01/18 0742  10/01/18 0745  BP:  125/79  Pulse:  72  Resp:  16  Temp:  98.4 F (36.9 C)  SpO2: 94% 93%    Intake/Output Summary (Last 24 hours) at 10/01/2018 0943 Last data filed at 10/01/2018 0500 Gross per 24 hour  Intake 360 ml  Output 568 ml  Net -208 ml   Filed Weights   09/04/18 1330 09/19/18 0913  Weight: 51.3 kg 51.3 kg    Exam:   General:  No distress   Cardiovascular: s1,s2 rrr  Respiratory: air leak. No wheezing   Abdomen: soft, nt   Musculoskeletal: no edema    Data Reviewed: Basic Metabolic Panel: No results for input(s): NA, K, CL, CO2, GLUCOSE, BUN, CREATININE, CALCIUM, MG, PHOS in the last 168 hours. Liver Function Tests: No results for input(s): AST, ALT, ALKPHOS, BILITOT, PROT, ALBUMIN in the last 168 hours. No results for input(s): LIPASE, AMYLASE in the last 168 hours. No results for input(s): AMMONIA in the last 168 hours. CBC: No results for input(s): WBC, NEUTROABS, HGB, HCT, MCV, PLT in the last 168 hours. Cardiac Enzymes: No results for input(s): CKTOTAL, CKMB, CKMBINDEX, TROPONINI in the last 168 hours. BNP (last 3 results) No results for input(s): BNP in the last 8760 hours.  ProBNP (last 3 results) No results for input(s): PROBNP in the last 8760 hours.  CBG: No results for input(s): GLUCAP in the last 168 hours.  No results found for this or any previous visit (from the past 240 hour(s)).   Studies: Dg Chest Port 1 View  Result Date: 09/29/2018 CLINICAL DATA:  Follow-up chest tube repositioning EXAM: PORTABLE CHEST 1 VIEW COMPARISON:  Film from earlier in the same day. FINDINGS: Cardiac shadow is stable. Aortic calcifications are again seen. Large apical blebs are again identified. Diffuse scarring is noted bilaterally and stable. Multiple endobronchial plugs are noted on the right. Previously seen chest tube is been removed in the interval and replaced with a new chest tube in similar positioning. No definitive pneumothorax is noted at this  time. Considerable subcutaneous air remains. No bony abnormality is noted. No infiltrate is seen. IMPRESSION: Status post chest tube exchange. No significant pneumothorax is noted. The remainder of the exam is stable from the prior study. Electronically Signed   By: Inez Catalina M.D.   On: 09/29/2018 10:49    Scheduled Meds: . azithromycin  500 mg Oral Daily  . enoxaparin (LOVENOX) injection  40 mg Subcutaneous Q24H  . ethambutol  1,000 mg Oral Daily  . feeding supplement (ENSURE ENLIVE)  237 mL Oral TID BM  . fluticasone furoate-vilanterol  1 puff Inhalation Daily  . guaiFENesin  600 mg Oral BID  . HYDROmorphone   Intravenous Q4H  . Isavuconazonium Sulfate  372 mg Oral Daily  . levothyroxine  75 mcg Oral QAC breakfast  . multivitamin with minerals  1 tablet Oral Daily  . predniSONE  5 mg Oral Q breakfast  . senna-docusate  1 tablet Oral QHS  . umeclidinium bromide  1 puff Inhalation Daily   Continuous Infusions: . lactated ringers 10 mL/hr at 09/22/18 2300  . 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 If 7PM-7AM, please contact night-coverage at [email or web address], password [facility] 10/01/2018, 9:43 AM  LOS: 27 days

## 2018-10-02 ENCOUNTER — Inpatient Hospital Stay (HOSPITAL_COMMUNITY): Payer: BLUE CROSS/BLUE SHIELD

## 2018-10-02 NOTE — Progress Notes (Signed)
3 Days Post-Op Procedure(s) (LRB): VIDEO BRONCHOSCOPY WITH INSERTION OF INTERBRONCHIAL VALVE  (IBV) (N/A) CHEST TUBE REPLACEMENT (Right) Subjective: No complaints this AM Decreased SQ air in chest  Objective: Vital signs in last 24 hours: Temp:  [97.8 F (36.6 C)-98.6 F (37 C)] 98.6 F (37 C) (11/14 0813) Pulse Rate:  [63-78] 78 (11/14 0813) Cardiac Rhythm: Normal sinus rhythm (11/14 0704) Resp:  [18-22] 22 (11/14 0813) BP: (106-124)/(73-84) 106/79 (11/14 0813) SpO2:  [96 %-100 %] 97 % (11/14 0500)  Hemodynamic parameters for last 24 hours:    Intake/Output from previous day: 11/13 0701 - 11/14 0700 In: 960 [P.O.:960] Out: 500 [Urine:500] Intake/Output this shift: No intake/output data recorded.  General appearance: alert, cooperative and no distress Heart: regular rate and rhythm Lungs: clear to auscultation bilaterally no air leak  Lab Results: No results for input(s): WBC, HGB, HCT, PLT in the last 72 hours. BMET: No results for input(s): NA, K, CL, CO2, GLUCOSE, BUN, CREATININE, CALCIUM in the last 72 hours.  PT/INR: No results for input(s): LABPROT, INR in the last 72 hours. ABG    Component Value Date/Time   PHART 7.427 09/13/2018 0445   HCO3 28.4 (H) 09/13/2018 0445   TCO2 26 10/13/2015 0420   ACIDBASEDEF 2.0 12/22/2010 1522   O2SAT 99.2 09/13/2018 0445   CBG (last 3)  No results for input(s): GLUCAP in the last 72 hours.  Assessment/Plan: S/P Procedure(s) (LRB): VIDEO BRONCHOSCOPY WITH INSERTION OF INTERBRONCHIAL VALVE  (IBV) (N/A) CHEST TUBE REPLACEMENT (Right) -No air leak over past 24 hours once connector replaced SQ air has lessened CT to water seal and check CXR Possibly dc tube in AM  LOS: 28 days    Melrose Nakayama 10/02/2018

## 2018-10-02 NOTE — Progress Notes (Signed)
[facility] PROGRESS NOTE  ALLYN BERTONI MVH:[id number] DOB: 1976/11/12 DOA: 09/04/2018 PCP: Wallene Dales, MD  Brief summary   42 year old male with history of asthma, GERD, hypothyroidism, aspergilloma, left upper lobe segmentectomy for aspergilloma/MAI, bullous lung disease presented on 09/04/2018 with shortness of breath and was found to have tension pneumothorax.  CVTS was consulted, right-sided chest tube was placed urgently.  Pneumothorax resolved with chest tube, this was removed on 09/07/2018.  Patient was found to have worsening dyspnea on 09/08/2018 and repeat chest x-ray showed recurrent tension pneumothorax which required another urgent chest tube on 09/08/2018.  He underwent  VATS on 09/12/2018 by CVTS. Because of persistent air leak, he underwent interbronchial valves on 09/19/2018 by CVTS. Then again on 11/12: Bronchoscopy with endobronchial valve placement in right middle lobe bronchus and exchange of right pleural chest tube   Assessment/Plan:  Recurrent tension pneumothorax -Chest tube placed urgently on 09/04/2018 by Dr. Roxan Hockey which was subsequently removed on 09/07/2018 ; repeat chest x-ray was stable without pneumothorax -Patient became dyspneic again on 09/08/2018 and developed another spontaneous tension pneumothorax which required chest tube placement again by Dr. Roxan Hockey -Underwent VATS and stapling of blebs in the right lower and middle lobes by CVTS -Patient had persistent air leak following this for which he underwent intrabronchial valves on 09/19/2018 by CVTS -Status post bronchoscopy and endobronchial valve placement again along with extensive right pleural chest tube on 09/29/2018 by CVTS. -persistent air leak.  improvement on repeat CXR on 11/13.  CVTS is managing chest tube. Cont per ct surgery, appreciate input   History of MAI. Continue home regimen of ethambutol and azithromycin.  Outpatient follow-up by infectious disease  History of  aspergilloma. History of remote left upper lobe segmentectomy. Continue antifungal Cresemba. Outpatient follow-up with pulmonary and ID. continue prednisone 5 mg daily  History of severe persistent asthma. Stable.  Continue bronchodilators  Hypothyroidism. Continue Synthroid  Iron deficiency anemia. Hemoglobin stable.  Continue home iron supplement  Severe malnutrition. Follow nutrition recommendations   Code Status: full Family Communication: d/w patient, RN (indicate person spoken with, relationship, and if by phone, the number) Disposition Plan: pend clinical improvement    Consultants:  CT surgery   Procedures: Chest tube insertion x2; VATS; interbronchial valves placement  Antibiotics: Anti-infectives (From admission, onward)   Start     Dose/Rate Route Frequency Ordered Stop   09/20/18 0600  cefUROXime (ZINACEF) 1.5 g in sodium chloride 0.9 % 100 mL IVPB  Status:  Discontinued     1.5 g 200 mL/hr over 30 Minutes Intravenous On call to O.R. 09/19/18 0936 09/19/18 1410   09/14/18 1030  azithromycin (ZITHROMAX) tablet 500 mg     500 mg Oral Daily 09/14/18 1022     09/12/18 1700  ceFAZolin (ANCEF) IVPB 2g/100 mL premix     2 g 200 mL/hr over 30 Minutes Intravenous Every 8 hours 09/12/18 1544 09/13/18 0106   09/12/18 0645  ceFAZolin (ANCEF) IVPB 2g/100 mL premix     2 g 200 mL/hr over 30 Minutes Intravenous 30 min pre-op 09/11/18 2006 09/12/18 0837   09/05/18 1000  azithromycin (ZITHROMAX) tablet 500 mg  Status:  Discontinued     500 mg Oral Daily 09/05/18 0115 09/12/18 1544   09/05/18 1000  ethambutol (MYAMBUTOL) tablet 1,000 mg    Note to Pharmacy:  TAKE TWO AND ONE-HALF TABLETS BY MOUTH ONCE DAILY     1,000 mg Oral Daily 09/05/18 0115     09/05/18 1000  Isavuconazonium Sulfate CAPS  372 mg     372 mg Oral Daily 09/05/18 0115         (indicate start date, and stop date if known)  HPI/Subjective: Reports feeling "okay". No distress. Still air leak persistent.    Objective: Vitals:   10/02/18 0500 10/02/18 0813  BP: 124/84 106/79  Pulse: 67 78  Resp: 19 (!) 22  Temp: 97.8 F (36.6 C) 98.6 F (37 C)  SpO2: 97%     Intake/Output Summary (Last 24 hours) at 10/02/2018 0855 Last data filed at 10/02/2018 0700 Gross per 24 hour  Intake 960 ml  Output 500 ml  Net 460 ml   Filed Weights   09/04/18 1330 09/19/18 0913  Weight: 51.3 kg 51.3 kg    Exam:   General:  No distress   Cardiovascular: s1,s2 rrr  Respiratory: air leak. No wheezing   Abdomen: soft, nt   Musculoskeletal: no edema    Data Reviewed: Basic Metabolic Panel: No results for input(s): NA, K, CL, CO2, GLUCOSE, BUN, CREATININE, CALCIUM, MG, PHOS in the last 168 hours. Liver Function Tests: No results for input(s): AST, ALT, ALKPHOS, BILITOT, PROT, ALBUMIN in the last 168 hours. No results for input(s): LIPASE, AMYLASE in the last 168 hours. No results for input(s): AMMONIA in the last 168 hours. CBC: No results for input(s): WBC, NEUTROABS, HGB, HCT, MCV, PLT in the last 168 hours. Cardiac Enzymes: No results for input(s): CKTOTAL, CKMB, CKMBINDEX, TROPONINI in the last 168 hours. BNP (last 3 results) No results for input(s): BNP in the last 8760 hours.  ProBNP (last 3 results) No results for input(s): PROBNP in the last 8760 hours.  CBG: No results for input(s): GLUCAP in the last 168 hours.  No results found for this or any previous visit (from the past 240 hour(s)).   Studies: Dg Chest Port 1 View  Result Date: 10/01/2018 CLINICAL DATA:  Postoperative status. EXAM: PORTABLE CHEST 1 VIEW COMPARISON:  Radiograph of same day. FINDINGS: The heart size and mediastinal contours are within normal limits. Stable severe bullous emphysematous disease is noted in both upper lobes. Right-sided chest tube is unchanged in position. Right basilar pneumothorax noted on prior exam is significantly decreased currently. Stable subcutaneous emphysema is seen bilaterally,  right greater than left. The visualized skeletal structures are unremarkable. IMPRESSION: Stable position of right-sided chest tube, but right basilar pneumothorax noted on prior exam is significantly improved. Stable subcutaneous emphysema is noted bilaterally, right greater than left. Electronically Signed   By: Marijo Conception, M.D.   On: 10/01/2018 16:34   Dg Chest Port 1 View  Result Date: 10/01/2018 CLINICAL DATA:  Chest tube, pneumothorax EXAM: PORTABLE CHEST 1 VIEW COMPARISON:  09/29/2018 FINDINGS: Right chest tube remains in place. Recurrent right pneumothorax noted at the right lung base. Severe bullous emphysematous changes in the apices bilaterally. Extensive subcutaneous emphysema, stable. Heart is normal size. No visible effusions. IMPRESSION: Small right basilar pneumothorax with right chest tube in stable position. Stable extensive subcutaneous emphysema. Severe bullous emphysema. Electronically Signed   By: Rolm Baptise M.D.   On: 10/01/2018 09:57    Scheduled Meds: . azithromycin  500 mg Oral Daily  . enoxaparin (LOVENOX) injection  40 mg Subcutaneous Q24H  . ethambutol  1,000 mg Oral Daily  . feeding supplement (ENSURE ENLIVE)  237 mL Oral TID BM  . fluticasone furoate-vilanterol  1 puff Inhalation Daily  . guaiFENesin  600 mg Oral BID  . HYDROmorphone   Intravenous Q4H  . Isavuconazonium  Sulfate  372 mg Oral Daily  . levothyroxine  75 mcg Oral QAC breakfast  . multivitamin with minerals  1 tablet Oral Daily  . predniSONE  5 mg Oral Q breakfast  . senna-docusate  1 tablet Oral QHS  . umeclidinium bromide  1 puff Inhalation Daily   Continuous Infusions: . lactated ringers 10 mL/hr at 09/22/18 2300  . potassium chloride      Principal Problem:   Tension pneumothorax Active Problems:   Mycobacterium avium complex (HCC)   Invasive pulmonary aspergillosis (HCC)   Severe persistent asthma   Hypothyroidism   Iron deficiency anemia   Protein-calorie malnutrition,  severe    Time spent: >35 minutes     Kinnie Feil  [facility] Pager [phone]. If 7PM-7AM, please contact night-coverage at [email or web address], password [facility] 10/02/2018, 8:55 AM  LOS: 28 days

## 2018-10-03 ENCOUNTER — Inpatient Hospital Stay (HOSPITAL_COMMUNITY): Payer: BLUE CROSS/BLUE SHIELD

## 2018-10-03 NOTE — Progress Notes (Signed)
4 Days Post-Op Procedure(s) (LRB): VIDEO BRONCHOSCOPY WITH INSERTION OF INTERBRONCHIAL VALVE  (IBV) (N/A) CHEST TUBE REPLACEMENT (Right) Subjective: Some discomfort from CT SQ air has gone down a little  Objective: Vital signs in last 24 hours: Temp:  [98 F (36.7 C)-99 F (37.2 C)] 98.5 F (36.9 C) (11/15 0735) Pulse Rate:  [71-86] 85 (11/15 0336) Cardiac Rhythm: Normal sinus rhythm (11/15 0700) Resp:  [17-26] 19 (11/15 0336) BP: (110-134)/(69-90) 122/90 (11/15 0735) SpO2:  [95 %-98 %] 95 % (11/15 0735)  Hemodynamic parameters for last 24 hours:    Intake/Output from previous day: 11/14 0701 - 11/15 0700 In: 480 [P.O.:480] Out: 1075 [Urine:1075] Intake/Output this shift: No intake/output data recorded.  General appearance: alert, cooperative and no distress Neurologic: intact Heart: regular rate and rhythm Lungs: clear to auscultation bilaterally Wound: clean and dry no air leak  Lab Results: No results for input(s): WBC, HGB, HCT, PLT in the last 72 hours. BMET: No results for input(s): NA, K, CL, CO2, GLUCOSE, BUN, CREATININE, CALCIUM in the last 72 hours.  PT/INR: No results for input(s): LABPROT, INR in the last 72 hours. ABG    Component Value Date/Time   PHART 7.427 09/13/2018 0445   HCO3 28.4 (H) 09/13/2018 0445   TCO2 26 10/13/2015 0420   ACIDBASEDEF 2.0 12/22/2010 1522   O2SAT 99.2 09/13/2018 0445   CBG (last 3)  No results for input(s): GLUCAP in the last 72 hours.  Assessment/Plan: S/P Procedure(s) (LRB): VIDEO BRONCHOSCOPY WITH INSERTION OF INTERBRONCHIAL VALVE  (IBV) (N/A) CHEST TUBE REPLACEMENT (Right) -No air leak x 48 hours- dc chest tube Continue ambulation Wean O2 Home in next couple of days depending on progress   LOS: 29 days    [name] 10/03/2018

## 2018-10-03 NOTE — Plan of Care (Signed)
  Problem: Education: Goal: Knowledge of General Education information will improve Description Including pain rating scale, medication(s)/side effects and non-pharmacologic comfort measures Outcome: Progressing   Problem: Clinical Measurements: Goal: Ability to maintain clinical measurements within normal limits will improve Outcome: Progressing Goal: Will remain free from infection Outcome: Progressing Goal: Diagnostic test results will improve Outcome: Progressing Goal: Respiratory complications will improve Outcome: Progressing Goal: Cardiovascular complication will be avoided Outcome: Progressing   Problem: Elimination: Goal: Will not experience complications related to bowel motility Outcome: Progressing Goal: Will not experience complications related to urinary retention Outcome: Progressing   Problem: Safety: Goal: Ability to remain free from injury will improve Outcome: Progressing   Problem: Skin Integrity: Goal: Risk for impaired skin integrity will decrease Outcome: Progressing

## 2018-10-03 NOTE — Progress Notes (Signed)
[facility] PROGRESS NOTE  [name] EPP:[id number] DOB: 1976/09/24 DOA: 09/04/2018 PCP: Wallene Dales, MD  Brief summary   42 year old male with history of asthma, GERD, hypothyroidism, aspergilloma, left upper lobe segmentectomy for aspergilloma/MAI, bullous lung disease presented on 09/04/2018 with shortness of breath and was found to have tension pneumothorax.  CVTS was consulted, right-sided chest tube was placed urgently.  Pneumothorax resolved with chest tube, this was removed on 09/07/2018.  Patient was found to have worsening dyspnea on 09/08/2018 and repeat chest x-ray showed recurrent tension pneumothorax which required another urgent chest tube on 09/08/2018.  He underwent  VATS on 09/12/2018 by CVTS. Because of persistent air leak, he underwent interbronchial valves on 09/19/2018 by CVTS. Then again on 11/12: Bronchoscopy with endobronchial valve placement in right middle lobe bronchus and exchange of right pleural chest tube   Assessment/Plan:  Recurrent tension pneumothorax -Chest tube placed urgently on 09/04/2018 by Dr. Roxan Hockey which was subsequently removed on 09/07/2018 ; repeat chest x-ray was stable without pneumothorax -Patient became dyspneic again on 09/08/2018 and developed another spontaneous tension pneumothorax which required chest tube placement again by Dr. Roxan Hockey -Underwent VATS and stapling of blebs in the right lower and middle lobes by CVTS -Patient had persistent air leak following this for which he underwent intrabronchial valves on 09/19/2018 by CVTS -Status post bronchoscopy and endobronchial valve placement again along with extensive right pleural chest tube on 09/29/2018 by CVTS. -11/15: no air leak for 48 hrs. Plan to d/c chest tube today. CVTS is managing chest tube. Cont per ct surgery, appreciate input   History of MAI. Continue home regimen of ethambutol and azithromycin.  Outpatient follow-up by infectious disease  History of  aspergilloma. History of remote left upper lobe segmentectomy. Continue antifungal Cresemba. Outpatient follow-up with pulmonary and ID. continue prednisone 5 mg daily  History of severe persistent asthma. Stable.  Continue bronchodilators  Hypothyroidism. Continue Synthroid  Iron deficiency anemia. Hemoglobin stable.  Continue home iron supplement  Severe malnutrition. Follow nutrition recommendations   Code Status: full Family Communication: d/w patient, RN (indicate person spoken with, relationship, and if by phone, the number) Disposition Plan: possible home in 24-48 hrs   Consultants:  CT surgery   Procedures: Chest tube insertion x2; VATS; interbronchial valves placement  Antibiotics: Anti-infectives (From admission, onward)   Start     Dose/Rate Route Frequency Ordered Stop   09/20/18 0600  cefUROXime (ZINACEF) 1.5 g in sodium chloride 0.9 % 100 mL IVPB  Status:  Discontinued     1.5 g 200 mL/hr over 30 Minutes Intravenous On call to O.R. 09/19/18 0936 09/19/18 1410   09/14/18 1030  azithromycin (ZITHROMAX) tablet 500 mg     500 mg Oral Daily 09/14/18 1022     09/12/18 1700  ceFAZolin (ANCEF) IVPB 2g/100 mL premix     2 g 200 mL/hr over 30 Minutes Intravenous Every 8 hours 09/12/18 1544 09/13/18 0106   09/12/18 0645  ceFAZolin (ANCEF) IVPB 2g/100 mL premix     2 g 200 mL/hr over 30 Minutes Intravenous 30 min pre-op 09/11/18 2006 09/12/18 0837   09/05/18 1000  azithromycin (ZITHROMAX) tablet 500 mg  Status:  Discontinued     500 mg Oral Daily 09/05/18 0115 09/12/18 1544   09/05/18 1000  ethambutol (MYAMBUTOL) tablet 1,000 mg    Note to Pharmacy:  TAKE TWO AND ONE-HALF TABLETS BY MOUTH ONCE DAILY     1,000 mg Oral Daily 09/05/18 0115     09/05/18 1000  Isavuconazonium Sulfate CAPS 372 mg     372 mg Oral Daily 09/05/18 0115         (indicate start date, and stop date if known)  HPI/Subjective: No air leak. No distress.  Objective: Vitals:   10/03/18 0336  10/03/18 0735  BP: 113/77 122/90  Pulse: 85   Resp: 19   Temp: 99 F (37.2 C) 98.5 F (36.9 C)  SpO2: 97% 95%    Intake/Output Summary (Last 24 hours) at 10/03/2018 0849 Last data filed at 10/03/2018 0700 Gross per 24 hour  Intake 480 ml  Output 1075 ml  Net -595 ml   Filed Weights   09/04/18 1330 09/19/18 0913  Weight: 51.3 kg 51.3 kg    Exam:   General:  No distress   Cardiovascular: s1,s2 rrr  Respiratory: air leak. No wheezing   Abdomen: soft, nt   Musculoskeletal: no edema    Data Reviewed: Basic Metabolic Panel: No results for input(s): NA, K, CL, CO2, GLUCOSE, BUN, CREATININE, CALCIUM, MG, PHOS in the last 168 hours. Liver Function Tests: No results for input(s): AST, ALT, ALKPHOS, BILITOT, PROT, ALBUMIN in the last 168 hours. No results for input(s): LIPASE, AMYLASE in the last 168 hours. No results for input(s): AMMONIA in the last 168 hours. CBC: No results for input(s): WBC, NEUTROABS, HGB, HCT, MCV, PLT in the last 168 hours. Cardiac Enzymes: No results for input(s): CKTOTAL, CKMB, CKMBINDEX, TROPONINI in the last 168 hours. BNP (last 3 results) No results for input(s): BNP in the last 8760 hours.  ProBNP (last 3 results) No results for input(s): PROBNP in the last 8760 hours.  CBG: No results for input(s): GLUCAP in the last 168 hours.  No results found for this or any previous visit (from the past 240 hour(s)).   Studies: Dg Chest Port 1 View  Result Date: 10/02/2018 CLINICAL DATA:  Chest tube in place. EXAM: PORTABLE CHEST 1 VIEW COMPARISON:  October 01, 2018; August 26, 2017 FINDINGS: Stable chest tube position on the right. Previously noted right base pneumothorax is currently not appreciable. Subcutaneous emphysema remains on both sides, more on the right the left, grossly stable. Sizable bullae in the upper lobes remain stable. There is postoperative change bilaterally appears stable. There is also scarring elsewhere in each upper lobe,  stable. There is no frank consolidation. Heart size and pulmonary vascularity are stable. No adenopathy evident. No bone lesions. IMPRESSION: Unchanged chest tube position. No pneumothorax evident currently. Extensive upper lobe bullous disease noted. Upper lobe scarring noted. Postoperative changes bilaterally noted. Stable cardiac silhouette. Extensive subcutaneous air remains. Electronically Signed   By: Lowella Grip III M.D.   On: 10/02/2018 09:53   Dg Chest Port 1 View  Result Date: 10/01/2018 CLINICAL DATA:  Postoperative status. EXAM: PORTABLE CHEST 1 VIEW COMPARISON:  Radiograph of same day. FINDINGS: The heart size and mediastinal contours are within normal limits. Stable severe bullous emphysematous disease is noted in both upper lobes. Right-sided chest tube is unchanged in position. Right basilar pneumothorax noted on prior exam is significantly decreased currently. Stable subcutaneous emphysema is seen bilaterally, right greater than left. The visualized skeletal structures are unremarkable. IMPRESSION: Stable position of right-sided chest tube, but right basilar pneumothorax noted on prior exam is significantly improved. Stable subcutaneous emphysema is noted bilaterally, right greater than left. Electronically Signed   By: Marijo Conception, M.D.   On: 10/01/2018 16:34   Dg Chest Port 1 View  Result Date: 10/01/2018 CLINICAL DATA:  Chest  tube, pneumothorax EXAM: PORTABLE CHEST 1 VIEW COMPARISON:  09/29/2018 FINDINGS: Right chest tube remains in place. Recurrent right pneumothorax noted at the right lung base. Severe bullous emphysematous changes in the apices bilaterally. Extensive subcutaneous emphysema, stable. Heart is normal size. No visible effusions. IMPRESSION: Small right basilar pneumothorax with right chest tube in stable position. Stable extensive subcutaneous emphysema. Severe bullous emphysema. Electronically Signed   By: Rolm Baptise M.D.   On: 10/01/2018 09:57     Scheduled Meds: . azithromycin  500 mg Oral Daily  . enoxaparin (LOVENOX) injection  40 mg Subcutaneous Q24H  . ethambutol  1,000 mg Oral Daily  . feeding supplement (ENSURE ENLIVE)  237 mL Oral TID BM  . fluticasone furoate-vilanterol  1 puff Inhalation Daily  . guaiFENesin  600 mg Oral BID  . Isavuconazonium Sulfate  372 mg Oral Daily  . levothyroxine  75 mcg Oral QAC breakfast  . multivitamin with minerals  1 tablet Oral Daily  . predniSONE  5 mg Oral Q breakfast  . senna-docusate  1 tablet Oral QHS  . umeclidinium bromide  1 puff Inhalation Daily   Continuous Infusions: . lactated ringers 10 mL/hr at 09/22/18 2300  . potassium chloride      Principal Problem:   Tension pneumothorax Active Problems:   Mycobacterium avium complex (HCC)   Invasive pulmonary aspergillosis (HCC)   Severe persistent asthma   Hypothyroidism   Iron deficiency anemia   Protein-calorie malnutrition, severe    Time spent: >35 minutes     Kinnie Feil  [facility] Pager [phone]. If 7PM-7AM, please contact night-coverage at [email or web address], password [facility] 10/03/2018, 8:49 AM  LOS: 29 days

## 2018-10-03 NOTE — Care Management Note (Signed)
Case Management Note Previous Note Created by Tomi Bamberger  [name] MRN: [id number] Date of Birth: 03-17-1976  Subjective/Objective:   From home alone, presents with tension ptx, chest tube was placed , chest tube dc'd 10/20.  For cxr this am, plan for possible dc today, will need HHRN, HHPT, home oxygen and w/chair.  NCM offered choice to patient, he chose [facility] from [employer].  Referral given to [place] with [facility], soc will begin 24 to 48 hrs post dc.he will be going to his parents home at discharge addrss of [address] [place] [address], phone is [phone]. Will need orders prior to dc.                  Action/Plan: DC home when medically ready.   Expected Discharge Date:  09/08/18               Expected Discharge Plan:  [place]  In-House Referral:     Discharge planning Services  CM Consult  Post Acute Care Choice:  Durable Medical Equipment, Home Health Choice offered to:  Patient  DME Arranged:  Wheelchair manual, Oxygen DME Agency:  [place] Arranged:  RN, PT, Disease Management [place] Agency:  [place]  Status of Service:  Completed, signed off  If discussed at [place] of Stay Meetings, dates discussed:    Additional Comments: 10/03/2018 Pt is now 4 days s/p inter bronchial valve insertion.  Pt had chest tube removed today.   Maryclare Labrador, RN 10/03/2018, 2:17 PM

## 2018-10-04 ENCOUNTER — Inpatient Hospital Stay (HOSPITAL_COMMUNITY): Payer: BLUE CROSS/BLUE SHIELD

## 2018-10-04 NOTE — Plan of Care (Signed)
  Problem: Education: Goal: Knowledge of General Education information will improve Description Including pain rating scale, medication(s)/side effects and non-pharmacologic comfort measures Outcome: Progressing   Problem: Clinical Measurements: Goal: Ability to maintain clinical measurements within normal limits will improve Outcome: Progressing Goal: Will remain free from infection Outcome: Progressing Goal: Diagnostic test results will improve Outcome: Progressing Goal: Respiratory complications will improve Outcome: Progressing Goal: Cardiovascular complication will be avoided Outcome: Progressing   Problem: Elimination: Goal: Will not experience complications related to bowel motility Outcome: Progressing Goal: Will not experience complications related to urinary retention Outcome: Progressing   Problem: Safety: Goal: Ability to remain free from injury will improve Outcome: Progressing   Problem: Skin Integrity: Goal: Risk for impaired skin integrity will decrease Outcome: Progressing

## 2018-10-04 NOTE — Progress Notes (Signed)
PROGRESS NOTE    [name]  [name]:[id number] DOB: Jun 23, 1976 DOA: 09/04/2018 PCP: Brett Dales, MD      Brief Narrative:  Mr. [name] is a 42 y.o. 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    CVTS was consulted, right-sided chest tube was placed urgently, 10/17.  Pneumothorax resolved with chest tube, this was removed on 09/07/2018.   Patient was found to have worsening dyspnea on 09/08/2018 and repeat chest x-ray showed recurrent tension pneumothorax which required another urgent chest tube on 09/08/2018. He underwent VATS on 09/12/2018 by CVTS. Because of persistent air leak, he underwent interbronchial valves on 09/19/2018 by CVTS.   Then again on 11/12: Bronchoscopy with endobronchial valve placement in right middle lobe bronchus and exchange of right pleural chest tube         Assessment & Plan:  Recurrent tension pneumothorax Chest tube out on 11/15.  CXR this AM report reviewed, no recurrence.  No pain.  -Wean O2 as able -Consult to CT surgery, appreciate cares   Severe bullous emphysema Severe persistent asthma ABPA on chronic prednisone Feels about at baseline.  Not previously on home O2.  FEV1 28% in 2018.  Follows with Dr. Chase Farmer. -Continue prednisone -Continue PRN bronchodilators -Continue Breo -Resume home Spiriva as Incruse   History of MAI History of aspergilloma s/p VATS 2012 -Continue azithromycin, ethambutol -Continue Cresemba, lifelong antifungal  Hypothyroidism -Continue levothyroxine  Iron deficiency anemia Hgb in normal range here, MCV and RDW normal. -May restart iron at discharge, follow up with PCP   Severe protein calorie malnutrition As evidenced by severe loss of cutaneous muscle mass and fat -Continue Ensure     MDM and disposition: The below labs and imaging reports  were reviewed and summarized above.  Medication management as above.  The patient was admitted with tension pneumothorax.  He has had more than one recurrence while here, requiring both VATS and twice endobronchial valves placed.  Chest tube out now yesterday, and no recurrence.  We will wean O2 as able.  CT surgery will see if he qualifies for home O2.    Defer to CT surgery to clear for discharge.          DVT prophylaxis: Lovenox Code Status: FULL Family Communication: None present    Consultants:   CT surgery  Procedures:   Chest tube placement 10/17  Chest tube placement 10/21  VATS, bleb resections, pleural abrasion 10/25  Intrabronchial valve placement 11/1  Endobronchial valve placement/exchange of right pleural chest tube 11/12  Antimicrobials:   Only chronic azithromycin, ethambutol, Cresemba   Subjective: Anxious.  No dyspnea above baseline.  No cough, sputum, hemoptysis, no fever.  No chest pain or confusion  Objective: Vitals:   10/04/18 0729 10/04/18 0736 10/04/18 1123 10/04/18 1308  BP: 119/86  121/75   Pulse: (!) 104  (!) 103 (!) 105  Resp: (!) 22  (!) 23 (!) 27  Temp: 98.8 F (37.1 C)  98.9 F (37.2 C)   TempSrc: Oral  Oral   SpO2: (!) 86% 92% (!) 86% 94%  Weight:      Height:        Intake/Output Summary (Last 24 hours) at 10/04/2018 1452 Last data filed at 10/04/2018 1432 Gross per 24 hour  Intake 680 ml  Output 776 ml  Net -96 ml   Filed Weights   09/04/18  1330 09/19/18 0913  Weight: 51.3 kg 51.3 kg    Examination: General appearance: thin adult male, alert and in no acute distress.   HEENT: Anicteric, conjunctiva pink, lids and lashes normal. No nasal deformity, discharge, epistaxis.  Lips moist.   Skin: Warm and dry.  no jaundice.  No suspicious rashes or lesions. Cardiac: RRR, nl S1-S2, no murmurs appreciated.  Capillary refill is brisk.  JVP not visible.  No LE edema.  Radia  pulses 2+ and symmetric. Respiratory: Normal  respiratory rate and rhythm.  Diminished throughout.  No wheezing, no rales. Abdomen: Abdomen soft.  No TTP. No ascites, distension, hepatosplenomegaly.   MSK: No deformities or effusions of large joints of upper or lower extremities bilaterally.  Diffuse severe loss of subcutaneous muscle mass and fat Neuro: Awake and alert.  EOMI, moves all extremities. Speech fluent.    Psych: Sensorium intact and responding to questions, attention normal. Affect normal.  Judgment and insight appear normal.    Data Reviewed: I have personally reviewed following labs and imaging studies:  CBC: No results for input(s): WBC, NEUTROABS, HGB, HCT, MCV, PLT in the last 168 hours. Basic Metabolic Panel: No results for input(s): NA, K, CL, CO2, GLUCOSE, BUN, CREATININE, CALCIUM, MG, PHOS in the last 168 hours. GFR: Estimated Creatinine Clearance: 85.5 mL/min (by C-G formula based on SCr of 0.75 mg/dL). Liver Function Tests: No results for input(s): AST, ALT, ALKPHOS, BILITOT, PROT, ALBUMIN in the last 168 hours. No results for input(s): LIPASE, AMYLASE in the last 168 hours. No results for input(s): AMMONIA in the last 168 hours. Coagulation Profile: No results for input(s): INR, PROTIME in the last 168 hours. Cardiac Enzymes: No results for input(s): CKTOTAL, CKMB, CKMBINDEX, TROPONINI in the last 168 hours. BNP (last 3 results) No results for input(s): PROBNP in the last 8760 hours. HbA1C: No results for input(s): HGBA1C in the last 72 hours. CBG: No results for input(s): GLUCAP in the last 168 hours. Lipid Profile: No results for input(s): CHOL, HDL, LDLCALC, TRIG, CHOLHDL, LDLDIRECT in the last 72 hours. Thyroid Function Tests: No results for input(s): TSH, T4TOTAL, FREET4, T3FREE, THYROIDAB in the last 72 hours. Anemia Panel: No results for input(s): VITAMINB12, FOLATE, FERRITIN, TIBC, IRON, RETICCTPCT in the last 72 hours. Urine analysis:    Component Value Date/Time   COLORURINE YELLOW  09/11/2018 [place] 09/11/2018 1714   LABSPEC 1.016 09/11/2018 1714   PHURINE 6.0 09/11/2018 1714   GLUCOSEU NEGATIVE 09/11/2018 1714   HGBUR NEGATIVE 09/11/2018 1714   BILIRUBINUR NEGATIVE 09/11/2018 1714   KETONESUR NEGATIVE 09/11/2018 1714   PROTEINUR NEGATIVE 09/11/2018 1714   UROBILINOGEN 0.2 05/28/2011 0943   NITRITE NEGATIVE 09/11/2018 1714   LEUKOCYTESUR NEGATIVE 09/11/2018 1714   Sepsis Labs: _0 (procalcitonin:4,lacticacidven:4)  )No results found for this or any previous visit (from the past 240 hour(s)).       Radiology Studies: Dg Chest 2 View  Result Date: 10/04/2018 CLINICAL DATA:  Follow-up pneumothorax EXAM: CHEST - 2 VIEW COMPARISON:  10/03/2018 FINDINGS: Cardiac shadow is stable. Stable scarring and apical bullous changes are seen. Small right pleural effusion is again identified inferiorly. Subcutaneous emphysema is seen. No definitive pneumothorax is noted. Endobronchial valves are again noted on the right stable from the prior study. No new focal abnormality is seen. IMPRESSION: Stable appearance when compare with the prior exam. No definitive pneumothorax is seen. Electronically Signed   By: Inez Catalina M.D.   On: 10/04/2018 09:42   Dg Chest 1v  Repeat Same Day  Result Date: 10/03/2018 CLINICAL DATA:  Chest tube removal. EXAM: CHEST - 1 VIEW SAME DAY COMPARISON:  10/03/2018. FINDINGS: Interim removal right chest tube. No pneumothorax. Endobronchial valves again noted on the right. These appear to be in stable position. Surgical sutures noted over both lungs. Severe bullous/cystic changes are noted throughout both lungs. Stable right pleural thickening. Diffuse chest wall subcutaneous emphysema again noted. No acute bony abnormality. IMPRESSION: 1. Interim removal of right chest tube. No pneumothorax. Interim bronchial bowels again noted in stable position on the right. Postsurgical changes both lungs. Stable diffuse chest wall subcutaneous  emphysema. 2. Severe bullous/cystic changes noted both lungs. No acute cardiopulmonary disease identified. Electronically Signed   By: Marcello Moores  Register   On: 10/03/2018 10:08   Dg Chest Port 1 View  Result Date: 10/03/2018 CLINICAL DATA:  Follow-up chest tube and pneumothorax. EXAM: PORTABLE CHEST 1 VIEW COMPARISON:  10/02/2018 FINDINGS: Stable position of the right chest tube. Again noted are endobronchial valves in the right hilar region. Again noted are large bulla formations in the upper chest bilaterally. Negative for a large pneumothorax. Large amount of bilateral subcutaneous gas is again noted. Right basilar densities are most compatible with atelectasis and scarring. Blunting at the right costophrenic angle is probably related to volume loss rather than pleural fluid. Heart size is stable. Trachea is midline. IMPRESSION: 1. Stable chest radiograph findings. Stable position of the right chest tube and no large pneumothorax. 2. Right basilar densities are most compatible with atelectasis and scarring. 3. No significant change in the extensive bilateral subcutaneous gas. 4. Presence of endobronchial valves. Electronically Signed   By: Markus Daft M.D.   On: 10/03/2018 09:52        Scheduled Meds: . azithromycin  500 mg Oral Daily  . enoxaparin (LOVENOX) injection  40 mg Subcutaneous Q24H  . ethambutol  1,000 mg Oral Daily  . feeding supplement (ENSURE ENLIVE)  237 mL Oral TID BM  . fluticasone furoate-vilanterol  1 puff Inhalation Daily  . guaiFENesin  600 mg Oral BID  . Isavuconazonium Sulfate  372 mg Oral Daily  . levothyroxine  75 mcg Oral QAC breakfast  . multivitamin with minerals  1 tablet Oral Daily  . predniSONE  5 mg Oral Q breakfast  . senna-docusate  1 tablet Oral QHS  . umeclidinium bromide  1 puff Inhalation Daily   Continuous Infusions: . lactated ringers 10 mL/hr at 09/22/18 2300  . potassium chloride       LOS: 30 days    Time spent: 35  minutes    Edwin Dada, MD [facility] 10/04/2018, 2:52 PM     Please page through AMION:  [email or web address] Password [email or web address] If 7PM-7AM, please contact night-coverage

## 2018-10-04 NOTE — Progress Notes (Signed)
SATURATION QUALIFICATIONS: (This note is used to comply with regulatory documentation for home oxygen)  Patient Saturations on Room Air at Rest = 94%  Patient Saturations on Room Air while Ambulating = 82%  Patient Saturations on 2 Liters of oxygen while Ambulating = 90%  Please briefly explain why patient needs home oxygen: patient maintains O2 sats between 82-87 while ambulating, is short of breath with increased work of breathing

## 2018-10-04 NOTE — Progress Notes (Addendum)
Brett SpringsSuite [address]       [place] [address]             [phone]      5 Days Post-Op Procedure(s) (LRB): VIDEO BRONCHOSCOPY WITH INSERTION OF INTERBRONCHIAL VALVE  (IBV) (N/A) CHEST TUBE REPLACEMENT (Right) Subjective: Feels okay this morning. Remains anxious about his health  Objective: Vital signs in last 24 hours: Temp:  [98 F (36.7 C)-100.1 F (37.8 C)] 98.8 F (37.1 C) (11/16 0729) Pulse Rate:  [87-108] 104 (11/16 0729) Cardiac Rhythm: Normal sinus rhythm (11/16 0700) Resp:  [21-33] 22 (11/16 0729) BP: (101-127)/(81-86) 119/86 (11/16 0729) SpO2:  [86 %-97 %] 92 % (11/16 0736)     Intake/Output from previous day: 11/15 0701 - 11/16 0700 In: 680 [P.O.:680] Out: 826 [Urine:825; Stool:1] Intake/Output this shift: Total I/O In: -  Out: 100 [Urine:100]  General appearance: alert, cooperative and no distress Heart: sinus tachycardia Lungs: clear to auscultation bilaterally Abdomen: soft, non-tender; bowel sounds normal; no masses,  no organomegaly Extremities: extremities normal, atraumatic, no cyanosis or edema Wound: clean and dry  Lab Results: No results for input(s): WBC, HGB, HCT, PLT in the last 72 hours. BMET: No results for input(s): NA, K, CL, CO2, GLUCOSE, BUN, CREATININE, CALCIUM in the last 72 hours.  PT/INR: No results for input(s): LABPROT, INR in the last 72 hours. ABG    Component Value Date/Time   PHART 7.427 09/13/2018 0445   HCO3 28.4 (H) 09/13/2018 0445   TCO2 26 10/13/2015 0420   ACIDBASEDEF 2.0 12/22/2010 1522   O2SAT 99.2 09/13/2018 0445   CBG (last 3)  No results for input(s): GLUCAP in the last 72 hours.  Assessment/Plan: S/P Procedure(s) (LRB): VIDEO BRONCHOSCOPY WITH INSERTION OF INTERBRONCHIAL VALVE  (IBV) (N/A) CHEST TUBE REPLACEMENT (Right)  1. Sinus tach-especially with activity. BP well controlled 2. Pulm-on 2L [place] with ambulation since he does desat. I will order a 6 minute walk test to see if he  qualifies for home oxygen. Would like to wean off if possible. Chest tube removed yesterday and today's xray is stable. Subq air is improving 3. Renal-creatinine 0.75, electrolytes okay 4. No new labs 5. Ordered home PT/OT/nurse since the patient lives alone and may be discharge with oxygen 6. Malnutrition-continue ensure. Nutrition following  Plan: 6 minute walk. Continue IS and walking in the halls. Possibly home in the next 1-2 days depending on progress.    LOS: 30 days    [name] 10/04/2018  evaluate for home O2  I have seen and examined [name] and agree with the above assessment  and plan.  Brett Isaac MD [facility] [phone] Office [phone] 10/04/2018 11:32 AM

## 2018-10-05 NOTE — Progress Notes (Signed)
Patient ID: [name], male   DOB: [birth year], 42 y.o.   MRN: [id number] TCTS DAILY ICU PROGRESS NOTE                   [address]            [place] [address]          [phone]   6 Days Post-Op Procedure(s) (LRB): VIDEO BRONCHOSCOPY WITH INSERTION OF INTERBRONCHIAL VALVE  (IBV) (N/A) CHEST TUBE REPLACEMENT (Right)  Total Length of Stay:  LOS: 31 days   Subjective: Patient up to chair, currently on room air but has been using oxygen with activity.  Objective: Vital signs in last 24 hours: Temp:  [98 F (36.7 C)-99.7 F (37.6 C)] 99 F (37.2 C) (11/17 0817) Pulse Rate:  [82-105] 101 (11/17 0817) Cardiac Rhythm: Normal sinus rhythm (11/17 0731) Resp:  [14-27] 18 (11/17 0817) BP: (109-121)/(72-88) 121/88 (11/17 0817) SpO2:  [86 %-96 %] 96 % (11/17 0817)  Filed Weights   09/04/18 1330 09/19/18 0913  Weight: 51.3 kg 51.3 kg    Weight change:    Hemodynamic parameters for last 24 hours:    Intake/Output from previous day: 11/16 0701 - 11/17 0700 In: 480 [P.O.:480] Out: 300 [Urine:300]  Intake/Output this shift: Total I/O In: 240 [P.O.:240] Out: 0   Current Meds: Scheduled Meds: . azithromycin  500 mg Oral Daily  . enoxaparin (LOVENOX) injection  40 mg Subcutaneous Q24H  . ethambutol  1,000 mg Oral Daily  . feeding supplement (ENSURE ENLIVE)  237 mL Oral TID BM  . fluticasone furoate-vilanterol  1 puff Inhalation Daily  . guaiFENesin  600 mg Oral BID  . Isavuconazonium Sulfate  372 mg Oral Daily  . levothyroxine  75 mcg Oral QAC breakfast  . multivitamin with minerals  1 tablet Oral Daily  . predniSONE  5 mg Oral Q breakfast  . senna-docusate  1 tablet Oral QHS  . umeclidinium bromide  1 puff Inhalation Daily   Continuous Infusions: . lactated ringers 10 mL/hr at 09/22/18 2300  . potassium chloride     PRN Meds:.acetaminophen, albuterol, guaiFENesin-dextromethorphan, oxyCODONE, polyethylene glycol, potassium chloride,  zolpidem  General appearance: alert and cooperative Neurologic: intact Heart: regular rate and rhythm, S1, S2 normal, no murmur, click, rub or gallop Lungs: diminished breath sounds bilaterally Abdomen: soft, non-tender; bowel sounds normal; no masses,  no organomegaly Extremities: extremities normal, atraumatic, no cyanosis or edema and Homans sign is negative, no sign of DVT Wound: Incisions healed well, chest tube stitch intact trimmed a little shorter  Lab Results: CBC:No results for input(s): WBC, HGB, HCT, PLT in the last 72 hours. BMET: No results for input(s): NA, K, CL, CO2, GLUCOSE, BUN, CREATININE, CALCIUM in the last 72 hours.  CMET: Lab Results  Component Value Date   WBC 9.6 09/24/2018   HGB 14.9 09/24/2018   HCT 44.3 09/24/2018   PLT 371 09/24/2018   GLUCOSE 92 09/24/2018   CHOL 163 05/28/2011   TRIG 74 05/28/2011   HDL 92 05/28/2011   LDLCALC 56 05/28/2011   ALT 20 09/14/2018   AST 21 09/14/2018   NA 137 09/24/2018   K 4.4 09/24/2018   CL 100 09/24/2018   CREATININE 0.75 09/24/2018   BUN 8 09/24/2018   CO2 31 09/24/2018   TSH 1.07 08/11/2018   INR 1.05 09/11/2018   HGBA1C  12/17/2010    5.4 (NOTE)  According to the ADA Clinical Practice Recommendations for 2011, when HbA1c is used as a screening test:   >=6.5%   Diagnostic of Diabetes Mellitus           (if abnormal result  is confirmed)  5.7-6.4%   Increased risk of developing Diabetes Mellitus  References:Diagnosis and Classification of Diabetes Mellitus,Diabetes JXFF,6922,30(OBTVM 1):S62-S69 and Standards of Medical Care in         Diabetes - 2011,Diabetes TNZD,8209,90  (Suppl 1):S11-S61.      PT/INR: No results for input(s): LABPROT, INR in the last 72 hours. Radiology: No results found.   Assessment/Plan: S/P Procedure(s) (LRB): VIDEO BRONCHOSCOPY WITH INSERTION OF INTERBRONCHIAL VALVE  (IBV) (N/A) CHEST TUBE REPLACEMENT  (Right) [facility] documented O2 saturations on room air with ambulation yesterday, documenting suitability for home oxygen will need to be arranged Follow-up PA and lateral chest x-ray tomorrow   [name] 10/05/2018 11:08 AM

## 2018-10-05 NOTE — Progress Notes (Signed)
PROGRESS NOTE    [name]  IOE:[id number] DOB: 09/27/1976 DOA: 09/04/2018 PCP: Wallene Dales, MD      Brief Narrative:  Mr. [name] is a 42 y.o. 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    CVTS was consulted, right-sided chest tube was placed urgently, 10/17.  Pneumothorax resolved with chest tube, this was removed on 09/07/2018.   Patient was found to have worsening dyspnea on 09/08/2018 and repeat chest x-ray showed recurrent tension pneumothorax which required another urgent chest tube on 09/08/2018. He underwent VATS on 09/12/2018 by CVTS. Because of persistent air leak, he underwent interbronchial valves on 09/19/2018 by CVTS.   Then again on 11/12: Bronchoscopy with endobronchial valve placement in right middle lobe bronchus and exchange of right pleural chest tube   Assessment & Plan:  Recurrent tension pneumothorax Chest tube out on 11/15.  CXR this AM report reviewed, no recurrence.  No pain.  -Wean O2 as able -Consult to CT surgery, appreciate cares Repeat chest x-ray tomorrow   Severe bullous emphysema Severe persistent asthma ABPA on chronic prednisone Feels about at baseline.  Not previously on home O2.  FEV1 28% in 2018.  Follows with [name]. -Continue prednisone -Continue PRN bronchodilators -Continue Breo -Resume home Spiriva as Incruse   History of MAI History of aspergilloma s/p VATS 2012 -Continue azithromycin, ethambutol -Continue Cresemba, lifelong antifungal  Hypothyroidism -Continue levothyroxine  Iron deficiency anemia Hgb in normal range here, MCV and RDW normal. -May restart iron at discharge, follow up with PCP   Severe protein calorie malnutrition As evidenced by severe loss of cutaneous muscle mass and fat -Continue Ensure     MDM and disposition: The below labs and  imaging reports were reviewed and summarized above.  Medication management as above.  The patient was admitted with tension pneumothorax.  He has had more than one recurrence while here, requiring both VATS and twice endobronchial valves placed.  Chest tube out now yesterday, and no recurrence.  We will wean O2 as able.  Needs home O2.  Defer to CT surgery to clear for discharge.          DVT prophylaxis: Lovenox Code Status: FULL Family Communication: None present    Consultants:   CT surgery  Procedures:   Chest tube placement 10/17  Chest tube placement 10/21  VATS, bleb resections, pleural abrasion 10/25  Intrabronchial valve placement 11/1  Endobronchial valve placement/exchange of right pleural chest tube 11/12  Antimicrobials:   Only chronic azithromycin, ethambutol, Cresemba   Subjective: Withdrawn.  No acute complaints.  Pain well controlled on current medical regimen.  Objective: Vitals:   10/05/18 0817 10/05/18 1146 10/05/18 1234 10/05/18 1628  BP: 121/88 104/70  106/73  Pulse: (!) 101 98  93  Resp: 18 (!) 25  14  Temp: 99 F (37.2 C) 99.5 F (37.5 C)  98.1 F (36.7 C)  TempSrc: Oral Oral  Oral  SpO2: 96%  99% (!) 89%  Weight:      Height:        Intake/Output Summary (Last 24 hours) at 10/05/2018 1857 Last data filed at 10/05/2018 1646 Gross per 24 hour  Intake 717 ml  Output 400 ml  Net 317 ml   Filed Weights   09/04/18 1330 09/19/18 0913  Weight: 51.3 kg 51.3 kg    Examination: General appearance: thin adult male, alert  and in no acute distress.   HEENT: Anicteric, conjunctiva pink, lids and lashes normal. No nasal deformity, discharge, epistaxis.  Lips moist.   Skin: Warm and dry.  no jaundice.  No suspicious rashes or lesions. Cardiac: RRR, nl S1-S2, no murmurs appreciated.  Capillary refill is brisk.  JVP not visible.  No LE edema.  Radia  pulses 2+ and symmetric. Respiratory: Normal respiratory rate and rhythm.  Diminished  throughout.  No wheezing, no rales. Abdomen: Abdomen soft.  No TTP. No ascites, distension, hepatosplenomegaly.   MSK: No deformities or effusions of large joints of upper or lower extremities bilaterally.  Diffuse severe loss of subcutaneous muscle mass and fat Neuro: Awake and alert.  EOMI, moves all extremities. Speech fluent.    Psych: Sensorium intact and responding to questions, attention normal. Affect normal.  Judgment and insight appear normal.    Data Reviewed: I have personally reviewed following labs and imaging studies:  CBC: No results for input(s): WBC, NEUTROABS, HGB, HCT, MCV, PLT in the last 168 hours. Basic Metabolic Panel: No results for input(s): NA, K, CL, CO2, GLUCOSE, BUN, CREATININE, CALCIUM, MG, PHOS in the last 168 hours. GFR: Estimated Creatinine Clearance: 85.5 mL/min (by C-G formula based on SCr of 0.75 mg/dL). Liver Function Tests: No results for input(s): AST, ALT, ALKPHOS, BILITOT, PROT, ALBUMIN in the last 168 hours. No results for input(s): LIPASE, AMYLASE in the last 168 hours. No results for input(s): AMMONIA in the last 168 hours. Coagulation Profile: No results for input(s): INR, PROTIME in the last 168 hours. Cardiac Enzymes: No results for input(s): CKTOTAL, CKMB, CKMBINDEX, TROPONINI in the last 168 hours. BNP (last 3 results) No results for input(s): PROBNP in the last 8760 hours. HbA1C: No results for input(s): HGBA1C in the last 72 hours. CBG: No results for input(s): GLUCAP in the last 168 hours. Lipid Profile: No results for input(s): CHOL, HDL, LDLCALC, TRIG, CHOLHDL, LDLDIRECT in the last 72 hours. Thyroid Function Tests: No results for input(s): TSH, T4TOTAL, FREET4, T3FREE, THYROIDAB in the last 72 hours. Anemia Panel: No results for input(s): VITAMINB12, FOLATE, FERRITIN, TIBC, IRON, RETICCTPCT in the last 72 hours. Urine analysis:    Component Value Date/Time   COLORURINE YELLOW 09/11/2018 J[place]  09/11/2018 1714   LABSPEC 1.016 09/11/2018 1714   PHURINE 6.0 09/11/2018 1714   GLUCOSEU NEGATIVE 09/11/2018 1714   HGBUR NEGATIVE 09/11/2018 1714   BILIRUBINUR NEGATIVE 09/11/2018 1714   KETONESUR NEGATIVE 09/11/2018 1714   PROTEINUR NEGATIVE 09/11/2018 1714   UROBILINOGEN 0.2 05/28/2011 0943   NITRITE NEGATIVE 09/11/2018 1714   LEUKOCYTESUR NEGATIVE 09/11/2018 1714   Sepsis Labs: _0 (procalcitonin:4,lacticacidven:4)  )No results found for this or any previous visit (from the past 240 hour(s)).       Radiology Studies: Dg Chest 2 View  Result Date: 10/04/2018 CLINICAL DATA:  Follow-up pneumothorax EXAM: CHEST - 2 VIEW COMPARISON:  10/03/2018 FINDINGS: Cardiac shadow is stable. Stable scarring and apical bullous changes are seen. Small right pleural effusion is again identified inferiorly. Subcutaneous emphysema is seen. No definitive pneumothorax is noted. Endobronchial valves are again noted on the right stable from the prior study. No new focal abnormality is seen. IMPRESSION: Stable appearance when compare with the prior exam. No definitive pneumothorax is seen. Electronically Signed   By: Inez Catalina M.D.   On: 10/04/2018 09:42        Scheduled Meds: . azithromycin  500 mg Oral Daily  . enoxaparin (LOVENOX) injection  40 mg  Subcutaneous Q24H  . ethambutol  1,000 mg Oral Daily  . feeding supplement (ENSURE ENLIVE)  237 mL Oral TID BM  . fluticasone furoate-vilanterol  1 puff Inhalation Daily  . guaiFENesin  600 mg Oral BID  . Isavuconazonium Sulfate  372 mg Oral Daily  . levothyroxine  75 mcg Oral QAC breakfast  . multivitamin with minerals  1 tablet Oral Daily  . predniSONE  5 mg Oral Q breakfast  . senna-docusate  1 tablet Oral QHS  . umeclidinium bromide  1 puff Inhalation Daily   Continuous Infusions: . lactated ringers 10 mL/hr at 09/22/18 2300  . potassium chloride       LOS: 31 days    Time spent: 35 minutes    Berle Mull, MD [facility] 10/05/2018, 6:57 PM     Please page through [place]:  [email or web address] Password [email or web address] If 7PM-7AM, please contact night-coverage

## 2018-10-05 NOTE — Plan of Care (Signed)
Pt continues to progress and meet outcomes

## 2018-10-06 ENCOUNTER — Inpatient Hospital Stay (HOSPITAL_COMMUNITY): Payer: BLUE CROSS/BLUE SHIELD

## 2018-10-06 MED ORDER — GUAIFENESIN ER 600 MG PO TB12: 600.0000 mg | ORAL_TABLET | Freq: Two times a day (BID) | ORAL | 0 refills | Status: DC

## 2018-10-06 MED ORDER — SENNOSIDES-DOCUSATE SODIUM 8.6-50 MG PO TABS: 1.0000 | ORAL_TABLET | Freq: Every day | ORAL | 0 refills | Status: DC

## 2018-10-06 MED ORDER — POLYETHYLENE GLYCOL 3350 17 G PO PACK: 17.0000 g | PACK | Freq: Every day | ORAL | 0 refills | Status: DC | PRN

## 2018-10-06 MED ORDER — ENSURE ENLIVE PO LIQD: 237.0000 mL | Freq: Three times a day (TID) | ORAL | 12 refills | Status: AC

## 2018-10-06 MED ORDER — OXYCODONE-ACETAMINOPHEN 10-325 MG PO TABS: 1.0000 | ORAL_TABLET | ORAL | 0 refills | Status: AC | PRN

## 2018-10-06 NOTE — Progress Notes (Addendum)
DC summary given to patient. Rx sent home with patient. Educated on medication regimen, follow up appointments, wound site care, continued pulmonary toileting. VSS. CCMD notified of discharge. All belongings sent home with patient. DC delayed d/t awaiting ride from private vehicle driven by family and home oxygen.   CM consulted for home O2 needs and set up with PCP.   1257: [facility]: O2 at bedside.   Awaiting family ride.

## 2018-10-06 NOTE — Care Management Note (Signed)
Case Management Note Previous Note Created by Tomi Bamberger  [name] MRN: [id number] Date of Birth: 07/04/1976  Subjective/Objective:   From home alone, presents with tension ptx, chest tube was placed , chest tube dc'd 10/20.  For cxr this am, plan for possible dc today, will need HHRN, HHPT, home oxygen and w/chair.  NCM offered choice to patient, he chose [facility] from [employer].  Referral given to [place] with [facility], soc will begin 24 to 48 hrs post dc.he will be going to his parents home at discharge addrss of [address] [place] [address], phone is [phone]. Will need orders prior to dc.                  Action/Plan: DC home when medically ready.   Expected Discharge Date:  10/06/18               Expected Discharge Plan:  [place]  In-House Referral:     Discharge planning Services  CM Consult  Post Acute Care Choice:  Durable Medical Equipment, Home Health Choice offered to:  Patient  DME Arranged:  Wheelchair manual, Oxygen DME Agency:  [place] Arranged:  RN, PT, Disease Management [place] Agency:  [place]  Status of Service:  Completed, signed off  If discussed at [place] of Stay Meetings, dates discussed:    Additional Comments: 10/06/2018  Pt to discharge home today.  CM confirmed home oxygen is accepted by [facility] - agency will start [facility] as arranged  10/03/18 Pt is now 4 days s/p inter bronchial valve insertion.  Pt had chest tube removed today.   Maryclare Labrador, RN 10/06/2018, 11:55 AM

## 2018-10-06 NOTE — Progress Notes (Signed)
7 Days Post-Op Procedure(s) (LRB): VIDEO BRONCHOSCOPY WITH INSERTION OF INTERBRONCHIAL VALVE  (IBV) (N/A) CHEST TUBE REPLACEMENT (Right) Subjective: Some incisional discomfort  Objective: Vital signs in last 24 hours: Temp:  [98 F (36.7 C)-99.5 F (37.5 C)] 98 F (36.7 C) (11/18 0500) Pulse Rate:  [77-98] 89 (11/18 0500) Cardiac Rhythm: Normal sinus rhythm (11/18 0400) Resp:  [14-26] 26 (11/18 0500) BP: (101-115)/(70-79) 115/79 (11/18 0500) SpO2:  [89 %-99 %] 91 % (11/18 0500)  Hemodynamic parameters for last 24 hours:    Intake/Output from previous day: 11/17 0701 - 11/18 0700 In: 717 [P.O.:717] Out: 575 [Urine:575] Intake/Output this shift: No intake/output data recorded.  General appearance: alert, cooperative and no distress Lungs: clear to auscultation bilaterally Wound: clean and dry  Lab Results: No results for input(s): WBC, HGB, HCT, PLT in the last 72 hours. BMET: No results for input(s): NA, K, CL, CO2, GLUCOSE, BUN, CREATININE, CALCIUM in the last 72 hours.  PT/INR: No results for input(s): LABPROT, INR in the last 72 hours. ABG    Component Value Date/Time   PHART 7.427 09/13/2018 0445   HCO3 28.4 (H) 09/13/2018 0445   TCO2 26 10/13/2015 0420   ACIDBASEDEF 2.0 12/22/2010 1522   O2SAT 99.2 09/13/2018 0445   CBG (last 3)  No results for input(s): GLUCAP in the last 72 hours.  Assessment/Plan: S/P Procedure(s) (LRB): VIDEO BRONCHOSCOPY WITH INSERTION OF INTERBRONCHIAL VALVE  (IBV) (N/A) CHEST TUBE REPLACEMENT (Right) -Doing well Dc home today Will see back in office in 2 weeks   LOS: 32 days    [name] 10/06/2018

## 2018-10-07 ENCOUNTER — Ambulatory Visit: Payer: Self-pay | Admitting: Thoracic Surgery (Cardiothoracic Vascular Surgery)

## 2018-10-07 DIAGNOSIS — Z8709 Personal history of other diseases of the respiratory system: Secondary | ICD-10-CM | POA: Diagnosis not present

## 2018-10-13 ENCOUNTER — Other Ambulatory Visit: Payer: Self-pay | Admitting: Internal Medicine

## 2018-10-13 ENCOUNTER — Telehealth: Payer: Self-pay

## 2018-10-13 NOTE — Telephone Encounter (Signed)
Cecilie Lowers, PT with [facility] [phone] contacted the office requesting to get verbal orders for physical therapy for continued therapy after PT evaluation.  Verbal orders given, will await faxed copy to have physician signature.

## 2018-10-14 ENCOUNTER — Encounter: Payer: Self-pay | Admitting: Thoracic Surgery (Cardiothoracic Vascular Surgery)

## 2018-10-14 ENCOUNTER — Ambulatory Visit
Admission: RE | Admit: 2018-10-14 | Discharge: 2018-10-14 | Disposition: A | Discharge status: Home / Self Care | Payer: BLUE CROSS/BLUE SHIELD | Source: Ambulatory Visit | Attending: Thoracic Surgery (Cardiothoracic Vascular Surgery) | Admitting: Thoracic Surgery (Cardiothoracic Vascular Surgery)

## 2018-10-14 ENCOUNTER — Other Ambulatory Visit: Payer: Self-pay | Admitting: Thoracic Surgery (Cardiothoracic Vascular Surgery)

## 2018-10-14 ENCOUNTER — Other Ambulatory Visit: Payer: Self-pay

## 2018-10-14 ENCOUNTER — Ambulatory Visit (INDEPENDENT_AMBULATORY_CARE_PROVIDER_SITE_OTHER): Payer: Self-pay | Admitting: Thoracic Surgery (Cardiothoracic Vascular Surgery)

## 2018-10-14 VITALS — BP 118/82 | HR 97 | Resp 18 | Ht 67.0 in | Wt 109.6 lb

## 2018-10-14 DIAGNOSIS — Z09 Encounter for follow-up examination after completed treatment for conditions other than malignant neoplasm: Secondary | ICD-10-CM

## 2018-10-14 DIAGNOSIS — IMO0002 Reserved for concepts with insufficient information to code with codable children: Secondary | ICD-10-CM

## 2018-10-14 DIAGNOSIS — J939 Pneumothorax, unspecified: Secondary | ICD-10-CM

## 2018-10-14 DIAGNOSIS — J930 Spontaneous tension pneumothorax: Secondary | ICD-10-CM

## 2018-10-14 DIAGNOSIS — J9382 Other air leak: Secondary | ICD-10-CM

## 2018-10-14 NOTE — Progress Notes (Signed)
[address]       [place] [address]             [phone]    HPI: [name] returns for a scheduled follow-up visit  [name] is a 42 year old man with a history of chronic Mycobacterium avium complex infection and bullous emphysema.  He presented with a right spontaneous pneumothorax in October.  Initially his air leak resolved and his tube was able to be removed, but he developed a recurrent pneumothorax the following day.  I did a right VATS for bleb resection on 09/12/2018.  He had multiple large blebs and diffuse emphysema throughout the lungs.  Despite using pericardial reinforcement on the staple lines he had a significant air leak.  We went back to the OR on 09/19/2018 and placed intrabronchial valves in the lower lobe bronchi.  That significantly reduced his air leak, but it did not completely resolve.  On 09/29/2018 we went back to the operating room and the valves in the lower lobe were all well seated.  A valve was placed in the right middle lobe bronchus as well.  His early finally resolved and we were able to get his tube out and discharging home a week later on 10/06/2018.  He feels well.  He is not having any pain at this point.  He did not take all of his Percocet.  He does get more short of breath with exertion than he did preoperatively.  He is on home oxygen which she is only using when he is walking outside his house or walking upstairs.  He does not need it at rest.  Past Medical History:  Diagnosis Date  . 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[place])   . Drug rash 12/16/2017  . Esophageal reflux   . 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 Lung disease, bullous ([place])   . MAI (mycobacterium avium-intracellulare) ([place])   . Mold exposure 12/05/2015  . 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 Solar lentigo 08/08/2015  . Weight loss 10/15/2016    Current Outpatient Medications  Medication Sig Dispense Refill  . albuterol (PROVENTIL HFA;VENTOLIN HFA)  108 (90 Base) MCG/ACT inhaler Inhale 2 puffs into the lungs every 6 (six) hours as needed. (Patient taking differently: Inhale 2 puffs into the lungs every 6 (six) hours as needed for wheezing or shortness of breath. ) 1 Inhaler 4  . azithromycin (ZITHROMAX) 500 MG tablet Take 1 tablet (500 mg total) by mouth daily. 30 tablet 11  . Calcium Carbonate-Vitamin D 600-400 MG-UNIT tablet Take by mouth.    . EPINEPHrine 0.3 mg/0.3 mL IJ SOAJ injection Inject 0.3 mLs (0.3 mg total) into the muscle once. 1 Device 11  . ethambutol (MYAMBUTOL) 400 MG tablet TAKE TWO AND ONE-HALF TABLETS BY MOUTH ONCE DAILY 225 tablet 5  . feeding supplement, ENSURE ENLIVE, (ENSURE ENLIVE) LIQD Take 237 mLs by mouth 3 (three) times daily between meals. 237 mL 12  . ferrous sulfate (FERRO-BOB) 325 (65 FE) MG tablet Take 325 mg by mouth daily.      . fluticasone furoate-vilanterol (BREO ELLIPTA) 100-25 MCG/INH AEPB Inhale 1 puff into the lungs daily. 1 each 5  . guaiFENesin (MUCINEX) 600 MG 12 hr tablet Take 1 tablet (600 mg total) by mouth 2 (two) times daily. 30 tablet 0  . Isavuconazonium Sulfate 186 MG CAPS Take 2 capsules (372 mg total) by mouth daily. 60 capsule 11  . levothyroxine (SYNTHROID, LEVOTHROID) 75 MCG tablet Take  75 mcg by mouth daily.   0  . polyethylene glycol (MIRALAX / GLYCOLAX) packet Take 17 g by mouth daily as needed for mild constipation. 14 each 0  . predniSONE (DELTASONE) 5 MG tablet TAKE 1 TABLET BY MOUTH ONCE DAILY WITH BREAKFAST 90 tablet 1  . senna-docusate (SENOKOT-S) 8.6-50 MG tablet Take 1 tablet by mouth at bedtime. 30 tablet 0  . Tiotropium Bromide Monohydrate (SPIRIVA RESPIMAT) 2.5 MCG/ACT AERS Inhale 2 puffs into the lungs daily. 1 Inhaler 5   No current facility-administered medications for this visit.     Physical Exam BP 118/82 (BP Location: Right Arm, Patient Position: Sitting, Cuff Size: Small)   Pulse 97   Resp 18   Ht _0  (1.702 m)   Wt 109 lb 9.6 oz (49.7 kg)   SpO2 93%  Comment: 3l [place]  BMI 17.17 kg/m  Thin 42 yo man in NAD Alert and oriented x3 with no focal deficits Lungs with diminished breath sounds bilaterally right greater than left, no rales or wheezing Incision clean dry and intact Cardiac regular rate and rhythm normal S1 and S2  Diagnostic Tests: CHEST - 2 VIEW  COMPARISON:  PA and lateral chest 10/06/2018 and 10/04/2018.  FINDINGS: Subcutaneous air about the chest seen on the prior examinations has almost completely resolved. Large bullous lesions are present in the apices bilaterally and bilateral pulmonary scar appear unchanged. No pneumothorax is identified. Small pleural effusions, greater on the right, are not notably changed. Heart size is normal. No acute bony abnormality.  IMPRESSION: Near complete resolution of subcutaneous emphysema since the most recent examination.  No change in large bullous lesions in the apices and pulmonary scar.  No change in small bilateral pleural effusions, greater on the right.   Electronically Signed   By: Inge Rise M.D.   On: 10/14/2018 15:22 I reviewed the chest x-ray images and concur with the findings noted above  Impression: [name] is a 42 year old gentleman who had right VATS for lobectomy and pleural abrasion about a month ago.  He had a large air leak initially and required intrabronchial valve placement ultimately completely occluding all of the lower and middle lobe bronchi.  His air leak finally stopped and he was discharged in the hospital a week ago.  His x-ray today looks good.  There is no pneumothorax.  There is a small effusion which is improved from his last film prior to discharge.  His subcutaneous emphysema has nearly completely resolved.  He is not having much pain.  His exercise tolerance is better but still well below his baseline.  That may be the case until we can get the intrabronchial valves out.  However, I do want to wait a full 8 weeks before  attempting to remove those.  That will be after the first of the year.  I will plan to see him back in a month with a repeat chest x-ray and we can discuss timing of valve removal at that visit.  Melrose Nakayama, MD [facility] [phone]

## 2018-10-23 ENCOUNTER — Other Ambulatory Visit: Payer: Self-pay | Admitting: Behavioral Health

## 2018-10-23 ENCOUNTER — Telehealth: Payer: Self-pay | Admitting: Behavioral Health

## 2018-10-23 DIAGNOSIS — A310 Pulmonary mycobacterial infection: Secondary | ICD-10-CM

## 2018-10-23 DIAGNOSIS — B449 Aspergillosis, unspecified: Secondary | ICD-10-CM

## 2018-10-23 MED ORDER — ISAVUCONAZONIUM SULFATE 186 MG PO CAPS: 372.0000 mg | ORAL_CAPSULE | Freq: Every day | ORAL | 2 refills | Status: DC

## 2018-10-23 NOTE — Telephone Encounter (Signed)
Patient called to have Cresemba refilled.  [facility] [place].  Originally was told by patient that it sent [facility] [place].  However [facility] [place] and was told they were unable to fill the medication and it needed to be filled at [place].  Patient aware of where medication was filled and verified understanding. Pricilla Riffle RN

## 2018-10-23 NOTE — Telephone Encounter (Signed)
Patient called stating Cresemba needed a PA.  Informed patient we had not received the request yet.  Called [facility] and they state they are still processing the claim and are working with [place] to she if it needs a PA or if it will be billed under major medical.  They state they will reach out tomorrow.  Patient made aware and verbalized understanding. Pricilla Riffle RN

## 2018-10-24 ENCOUNTER — Telehealth: Payer: Self-pay | Admitting: *Deleted

## 2018-10-24 NOTE — Telephone Encounter (Signed)
Patient called to check on his Cresemba PA. Advised him we have nothing in house that says he needs a PA and the last note stated the pharmacy was still processing the medication. Patient advised he has called the pharmacy and they told him we need to do an appeal as it was denied. Advised the patient we have not received anything in house but will call the pharmacy to see what we need to do.  [employer] and initiated the [place] [phone]) as the pharmacy had not done so. Forms are being faxed to clinic once received will complete and fax back will advise patient and let him know will not be finished today and will let him know Monday.

## 2018-10-27 NOTE — Telephone Encounter (Signed)
Called the patient to advise his medication was approved until 04/24/19 and the approval had been sent to the pharmacy as well.  Auth code [id number] 10/27/18-04/24/19

## 2018-10-27 NOTE — Telephone Encounter (Signed)
Called [facility] to inform that Prior Authorization for Corky Mull was approved.  They states claim went through with $0 co-pay.  Patient aware as well. Pricilla Riffle RN

## 2018-10-28 NOTE — Telephone Encounter (Signed)
Fantastic!  ?

## 2018-10-30 ENCOUNTER — Telehealth: Payer: Self-pay | Admitting: Internal Medicine

## 2018-10-30 MED ORDER — TIOTROPIUM BROMIDE MONOHYDRATE 2.5 MCG/ACT IN AERS: 2.0000 | INHALATION_SPRAY | Freq: Every day | RESPIRATORY_TRACT | 3 refills | Status: DC

## 2018-10-30 NOTE — Telephone Encounter (Signed)
Spoke with pt, he would like a refill on Spiriva 2.70m Rx sent in. Nothing further is needed  Patient Instructions by PMelvenia Needles NP at 08/11/2018 2:00 PM  Author: PMelvenia Needles NP Author Type: Nurse Practitioner Filed: 08/11/2018 3:32 PM  Note Status: Addendum Cosign: Cosign Not Required Encounter Date: 08/11/2018  Editor: PMelvenia Needles NP (Nurse Practitioner)  Prior Versions: 1. Parrett, TFonnie Mu NP (Nurse Practitioner) at 08/11/2018 2:33 PM - Signed    Stop Flovent  Increase BREO 200 1 puff daily , rinse after use. Finish sample and then return to BREO 100  Increase Prednisone 178mdaily for 2 weeks and then 74m68maily .  Labs today .  Follow up with ID regarding Cresemba causing GI issues .  Please eat often add High protein food and drinks.  Augmentin 8774m34mice daily  For 1 week , take with food . Eat yogurt.  Follow up with Dr. RamaChase CallerParrett NP in 3 weeks and As needed   Please contact office for sooner follow up if symptoms do not improve or worsen or seek emergency care       Instructions

## 2018-11-17 ENCOUNTER — Other Ambulatory Visit: Payer: Self-pay | Admitting: Thoracic Surgery (Cardiothoracic Vascular Surgery)

## 2018-11-17 DIAGNOSIS — J930 Spontaneous tension pneumothorax: Secondary | ICD-10-CM

## 2018-11-18 ENCOUNTER — Ambulatory Visit (INDEPENDENT_AMBULATORY_CARE_PROVIDER_SITE_OTHER): Payer: Self-pay | Admitting: Thoracic Surgery (Cardiothoracic Vascular Surgery)

## 2018-11-18 ENCOUNTER — Ambulatory Visit
Admission: RE | Admit: 2018-11-18 | Discharge: 2018-11-18 | Disposition: A | Discharge status: Home / Self Care | Payer: BLUE CROSS/BLUE SHIELD | Source: Ambulatory Visit | Attending: Thoracic Surgery (Cardiothoracic Vascular Surgery) | Admitting: Thoracic Surgery (Cardiothoracic Vascular Surgery)

## 2018-11-18 ENCOUNTER — Encounter: Payer: Self-pay | Admitting: Thoracic Surgery (Cardiothoracic Vascular Surgery)

## 2018-11-18 ENCOUNTER — Other Ambulatory Visit: Payer: Self-pay

## 2018-11-18 VITALS — BP 109/79 | HR 91 | Resp 18 | Ht 67.0 in | Wt 111.2 lb

## 2018-11-18 DIAGNOSIS — J439 Emphysema, unspecified: Secondary | ICD-10-CM

## 2018-11-18 DIAGNOSIS — J930 Spontaneous tension pneumothorax: Secondary | ICD-10-CM

## 2018-11-18 NOTE — H&P (View-Only) (Signed)
    [address]       [place] [address]             [phone]    HPI: Mr. 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  He presented in October with a spontaneous pneumothorax.  His air leak resolved initially and his tube was removed but he developed a recurrent pneumothorax the following day.  I did right VATS for bleb resection on 09/12/2018.  In addition to multiple large blebs he had diffuse emphysematous changes in the lungs.  He had a significant air leak postoperatively.  Went back on 2 occasions to place intrabronchial valves.  The most recent was on 09/29/2018.  Still took about a week for the air leak to resolve but ultimately did variably remove his chest tube.  I saw him back in the office on 10/14/2018.  He was doing well at that time.  He was more short of breath than he had been preoperatively, which was to be expected.  He was not having significant pain and had not even used all of his Percocet.  He continues to feel short of breath.  He notices that with even minor exertion his oxygen saturations were dropped.  He has been using oxygen at home.  Past Medical History:  Diagnosis Date  . 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HCC)   . Drug rash 12/16/2017  . Esophageal reflux   . 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 Lung disease, bullous (HCC)   . MAI (mycobacterium avium-intracellulare) (HCC)   . Mold exposure 12/05/2015  . 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 Solar lentigo 08/08/2015  . Weight loss 10/15/2016    Current Outpatient Medications  Medication Sig Dispense Refill  . albuterol (PROVENTIL HFA;VENTOLIN HFA) 108 (90 Base) MCG/ACT inhaler Inhale 2 puffs into the lungs every 6 (six) hours as needed. (Patient taking differently: Inhale 2 puffs into the lungs every 6 (six) hours as needed for wheezing or shortness of  breath. ) 1 Inhaler 4  . azithromycin (ZITHROMAX) 500 MG tablet Take 1 tablet (500 mg total) by mouth daily. 30 tablet 11  . Calcium Carbonate-Vitamin D 600-400 MG-UNIT tablet Take by mouth.    . EPINEPHrine 0.3 mg/0.3 mL IJ SOAJ injection Inject 0.3 mLs (0.3 mg total) into the muscle once. 1 Device 11  . ethambutol (MYAMBUTOL) 400 MG tablet TAKE TWO AND ONE-HALF TABLETS BY MOUTH ONCE DAILY 225 tablet 5  . feeding supplement, ENSURE ENLIVE, (ENSURE ENLIVE) LIQD Take 237 mLs by mouth 3 (three) times daily between meals. 237 mL 12  . ferrous sulfate (FERRO-BOB) 325 (65 FE) MG tablet Take 325 mg by mouth daily.      . fluticasone furoate-vilanterol (BREO ELLIPTA) 100-25 MCG/INH AEPB Inhale 1 puff into the lungs daily. 1 each 5  . guaiFENesin (MUCINEX) 600 MG 12 hr tablet Take 1 tablet (600 mg total) by mouth 2 (two) times daily. 30 tablet 0  . Isavuconazonium Sulfate 186 MG CAPS Take 2 capsules (372 mg total) by mouth daily. 60 capsule 2  . levothyroxine (SYNTHROID, LEVOTHROID) 75 MCG tablet Take 75 mcg by mouth daily.   0  . polyethylene glycol (MIRALAX / GLYCOLAX) packet Take 17 g by mouth daily as needed for mild constipation. 14 each 0  . predniSONE (DELTASONE) 5 MG tablet TAKE 1 TABLET BY MOUTH ONCE DAILY WITH   BREAKFAST 90 tablet 1  . senna-docusate (SENOKOT-S) 8.6-50 MG tablet Take 1 tablet by mouth at bedtime. 30 tablet 0  . Tiotropium Bromide Monohydrate (SPIRIVA RESPIMAT) 2.5 MCG/ACT AERS Inhale 2 puffs into the lungs daily. 1 Inhaler 3   No current facility-administered medications for this visit.     Physical Exam BP 109/79 (BP Location: Right Arm, Patient Position: Sitting, Cuff Size: Normal)   Pulse 91   Resp 18   Ht 5' 7" (1.702 m)   Wt 111 lb 3.2 oz (50.4 kg)   SpO2 92% Comment: RA  BMI 17.42 kg/m   Diagnostic Tests: CHEST - 2 VIEW  COMPARISON:  Radiographs of October 14, 2018.  FINDINGS: The heart size and mediastinal contours are within normal limits. Stable  severe emphysematous bullous change and scarring is noted in both upper lobes. Small bilateral pleural effusions are unchanged. No definite pneumothorax is noted. The visualized skeletal structures are unremarkable.  IMPRESSION: Stable severe emphysematous bulla and scarring seen in both upper lobes. Stable small pleural effusions. No significant change compared to prior exam.   Electronically Signed   By: James  Green Jr, M.D.   On: 11/18/2018 14:36 I personally reviewed the chest x-ray images and concur with the findings noted above  Impression: Rambo Doorn is a 42-year-old man with chronic Mycobacterium avium complex infection, bullous emphysema, and a recent spontaneous pneumothorax.  He had surgery for resection of giant bullae and had a prolonged air leak postoperatively.  He has intrabronchial valves in place.  Those were placed into settings 09/19/2018 and more recently 09/29/2018.  I recommended that we wait a full 8 weeks before removing the valves.  I recommended that we go ahead and remove the valves on Monday, 11/21/2018.  We will plan to do this in the main OR under general anesthesia.  We should be able to do it on an outpatient basis.  I informed him of the general nature of the procedure, which he is familiar with.  He understands the indications, risk, benefits, and alternatives.  He understands the risk include those associated with general anesthesia.  There is a risk of bleeding and recurrent pneumothorax.  He accepts the risks and wishes to proceed.  Plan: Bronchoscopy for removal of intrabronchial valves on Monday, 11/21/2018  Steven C Hendrickson, MD [facility] [phone]    

## 2018-11-18 NOTE — Progress Notes (Signed)
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  He presented in October with a spontaneous pneumothorax.  His air leak resolved initially and his tube was removed but he developed a recurrent pneumothorax the following day.  I did right VATS for bleb resection on 09/12/2018.  In addition to multiple large blebs he had diffuse emphysematous changes in the lungs.  He had a significant air leak postoperatively.  Went back on 2 occasions to place intrabronchial valves.  The most recent was on 09/29/2018.  Still took about a week for the air leak to resolve but ultimately did variably remove his chest tube.  I saw him back in the office on 10/14/2018.  He was doing well at that time.  He was more short of breath than he had been preoperatively, which was to be expected.  He was not having significant pain and had not even used all of his Percocet.  He continues to feel short of breath.  He notices that with even minor exertion his oxygen saturations were dropped.  He has been using oxygen at home.  Past Medical History:  Diagnosis Date  . 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[facility])   . Drug rash 12/16/2017  . Esophageal reflux   . 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 Lung disease, bullous ([facility])   . MAI (mycobacterium avium-intracellulare) ([facility])   . Mold exposure 12/05/2015  . 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 Solar lentigo 08/08/2015  . Weight loss 10/15/2016    Current Outpatient Medications  Medication Sig Dispense Refill  . albuterol (PROVENTIL HFA;VENTOLIN HFA) 108 (90 Base) MCG/ACT inhaler Inhale 2 puffs into the lungs every 6 (six) hours as needed. (Patient taking differently: Inhale 2 puffs into the lungs every 6 (six) hours as needed for wheezing or shortness of  breath. ) 1 Inhaler 4  . azithromycin (ZITHROMAX) 500 MG tablet Take 1 tablet (500 mg total) by mouth daily. 30 tablet 11  . Calcium Carbonate-Vitamin D 600-400 MG-UNIT tablet Take by mouth.    . EPINEPHrine 0.3 mg/0.3 mL IJ SOAJ injection Inject 0.3 mLs (0.3 mg total) into the muscle once. 1 Device 11  . ethambutol (MYAMBUTOL) 400 MG tablet TAKE TWO AND ONE-HALF TABLETS BY MOUTH ONCE DAILY 225 tablet 5  . feeding supplement, ENSURE ENLIVE, (ENSURE ENLIVE) LIQD Take 237 mLs by mouth 3 (three) times daily between meals. 237 mL 12  . ferrous sulfate (FERRO-BOB) 325 (65 FE) MG tablet Take 325 mg by mouth daily.      . fluticasone furoate-vilanterol (BREO ELLIPTA) 100-25 MCG/INH AEPB Inhale 1 puff into the lungs daily. 1 each 5  . guaiFENesin (MUCINEX) 600 MG 12 hr tablet Take 1 tablet (600 mg total) by mouth 2 (two) times daily. 30 tablet 0  . Isavuconazonium Sulfate 186 MG CAPS Take 2 capsules (372 mg total) by mouth daily. 60 capsule 2  . levothyroxine (SYNTHROID, LEVOTHROID) 75 MCG tablet Take 75 mcg by mouth daily.   0  . polyethylene glycol (MIRALAX / GLYCOLAX) packet Take 17 g by mouth daily as needed for mild constipation. 14 each 0  . predniSONE (DELTASONE) 5 MG tablet TAKE 1 TABLET BY MOUTH ONCE DAILY WITH  BREAKFAST 90 tablet 1  . senna-docusate (SENOKOT-S) 8.6-50 MG tablet Take 1 tablet by mouth at bedtime. 30 tablet 0  . Tiotropium Bromide Monohydrate (SPIRIVA RESPIMAT) 2.5 MCG/ACT AERS Inhale 2 puffs into the lungs daily. 1 Inhaler 3   No current facility-administered medications for this visit.     Physical Exam BP 109/79 (BP Location: Right Arm, Patient Position: Sitting, Cuff Size: Normal)   Pulse 91   Resp 18   Ht _0  (1.702 m)   Wt 111 lb 3.2 oz (50.4 kg)   SpO2 92% Comment: RA  BMI 17.42 kg/m   Diagnostic Tests: CHEST - 2 VIEW  COMPARISON:  Radiographs of October 14, 2018.  FINDINGS: The heart size and mediastinal contours are within normal limits. Stable  severe emphysematous bullous change and scarring is noted in both upper lobes. Small bilateral pleural effusions are unchanged. No definite pneumothorax is noted. The visualized skeletal structures are unremarkable.  IMPRESSION: Stable severe emphysematous bulla and scarring seen in both upper lobes. Stable small pleural effusions. No significant change compared to prior exam.   Electronically Signed   By: Marijo Conception, M.D.   On: 11/18/2018 14:36 I personally reviewed the chest x-ray images and concur with the findings noted above  Impression: [name] is a 42 year old man with chronic Mycobacterium avium complex infection, bullous emphysema, and a recent spontaneous pneumothorax.  He had surgery for resection of giant bullae and had a prolonged air leak postoperatively.  He has intrabronchial valves in place.  Those were placed into settings 09/19/2018 and more recently 09/29/2018.  I recommended that we wait a full 8 weeks before removing the valves.  I recommended that we go ahead and remove the valves on Monday, 11/21/2018.  We will plan to do this in the main OR under general anesthesia.  We should be able to do it on an outpatient basis.  I informed him of the general nature of the procedure, which he is familiar with.  He understands the indications, risk, benefits, and alternatives.  He understands the risk include those associated with general anesthesia.  There is a risk of bleeding and recurrent pneumothorax.  He accepts the risks and wishes to proceed.  Plan: Bronchoscopy for removal of intrabronchial valves on Monday, 11/21/2018  Melrose Nakayama, MD [facility] [phone]

## 2018-11-20 ENCOUNTER — Other Ambulatory Visit: Payer: Self-pay | Admitting: *Deleted

## 2018-11-20 DIAGNOSIS — J439 Emphysema, unspecified: Secondary | ICD-10-CM

## 2018-11-20 DIAGNOSIS — J9383 Other pneumothorax: Secondary | ICD-10-CM

## 2018-11-24 NOTE — Pre-Procedure Instructions (Signed)
[name]  11/24/2018      [facility] - [place], [place] [address] Phone: [phone] Fax: [phone]  [facility] - [place], [place] - [address] [place] [address] [place] [address] Phone: [phone] Fax: [phone]  [id number] - [address] [place] [address] [place] [address] Phone: [phone] Fax: [phone]  [place], [place] [address] Phone: [phone] Fax: [phone]  [facility] [place], [place] [address] [place] [address] Phone: [phone] Fax: [phone]    Your procedure is scheduled on January 13th.  Report to [facility] Admitting at [place].  Call this number if you have problems the morning of surgery:  [phone]   Remember:  Do not eat or drink after midnight.    Take these medicines the morning of surgery with A SIP OF WATER  albuterol (PROVENTIL HFA;VENTOLIN HFA) If needed. Bring Inhaler with you to surgery azithromycin (ZITHROMAX) ethambutol (MYAMBUTOL) fluticasone furoate-vilanterol (BREO ELLIPTA)  levothyroxine (SYNTHROID, LEVOTHROID) predniSONE (DELTASONE) Tiotropium Bromide Monohydrate (SPIRIVA RESPIMAT)   7 days prior to surgery STOP taking any Aspirin (unless otherwise instructed by your surgeon), Aleve, Naproxen, Ibuprofen, Motrin, Advil, Goody's, BC's, all herbal medications, fish oil, and all vitamins.     Do not wear jewelry.  Do not wear lotions, powders, or colognes, or deodorant.  Men may shave face and neck.  Do not bring valuables to the hospital.  [facility] is not responsible for any belongings or valuables.  Contacts, dentures or bridgework may not be worn into surgery.  Leave your suitcase  in the car.  After surgery it may be brought to your room.  For patients admitted to the hospital, discharge time will be determined by your treatment team.  Patients discharged the day of surgery will not be allowed to drive home.    [facility]- Preparing For Surgery  Before surgery, you can play an important role. Because skin is not sterile, your skin needs to be as free of germs as possible. You can reduce the number of germs on your skin by washing with CHG (chlorahexidine gluconate) Soap before surgery.  CHG is an antiseptic cleaner which kills germs and bonds with the skin to continue killing germs even after washing.    Oral Hygiene is also important to reduce your risk of infection.  Remember - BRUSH YOUR TEETH THE MORNING OF SURGERY WITH YOUR REGULAR TOOTHPASTE  Please do not use if you have an allergy to CHG or antibacterial soaps. If your skin becomes reddened/irritated stop using the CHG.  Do not shave (including legs and underarms) for at least 48 hours prior to first CHG shower. It is OK to shave your face.  Please follow these instructions carefully.   1. Shower the NIGHT BEFORE SURGERY and the MORNING OF SURGERY with CHG.   2. If you chose to wash your hair, wash your hair first as usual with your normal shampoo.  3. After you shampoo, rinse your hair and body thoroughly to remove the shampoo.  4. Use CHG as you would any other liquid soap. You can apply CHG directly to the skin and wash gently with a scrungie or a clean washcloth.   5. Apply the CHG Soap  to your body ONLY FROM THE NECK DOWN.  Do not use on open wounds or open sores. Avoid contact with your eyes, ears, mouth and genitals (private parts). Wash Face and genitals (private parts)  with your normal soap.  6. Wash thoroughly, paying special attention to the area where your surgery will be performed.  7. Thoroughly rinse your body with warm water from the neck down.  8. DO NOT shower/wash with your normal  soap after using and rinsing off the CHG Soap.  9. Pat yourself dry with a CLEAN TOWEL.  10. Wear CLEAN PAJAMAS to bed the night before surgery, wear comfortable clothes the morning of surgery  11. Place CLEAN SHEETS on your bed the night of your first shower and DO NOT SLEEP WITH PETS.    Day of Surgery:  Do not apply any deodorants/lotions.  Please wear clean clothes to the hospital/surgery center.   Remember to brush your teeth WITH YOUR REGULAR TOOTHPASTE.    Please read over the following fact sheets that you were given.

## 2018-11-25 ENCOUNTER — Encounter (HOSPITAL_COMMUNITY): Payer: Self-pay

## 2018-11-25 ENCOUNTER — Encounter (HOSPITAL_COMMUNITY)
Admission: RE | Admit: 2018-11-25 | Discharge: 2018-11-25 | Disposition: A | Discharge status: Home / Self Care | Payer: BLUE CROSS/BLUE SHIELD | Source: Ambulatory Visit | Attending: Thoracic Surgery (Cardiothoracic Vascular Surgery) | Admitting: Thoracic Surgery (Cardiothoracic Vascular Surgery)

## 2018-11-25 ENCOUNTER — Other Ambulatory Visit: Payer: Self-pay

## 2018-11-25 DIAGNOSIS — J9383 Other pneumothorax: Secondary | ICD-10-CM | POA: Insufficient documentation

## 2018-11-25 DIAGNOSIS — J439 Emphysema, unspecified: Secondary | ICD-10-CM

## 2018-11-25 DIAGNOSIS — Z01812 Encounter for preprocedural laboratory examination: Secondary | ICD-10-CM | POA: Insufficient documentation

## 2018-11-25 HISTORY — DX: Dyspnea, unspecified: R06.00

## 2018-11-25 HISTORY — DX: Anemia, unspecified: D64.9

## 2018-11-25 LAB — COMPREHENSIVE METABOLIC PANEL
ALT: 40 U/L (ref 0–44)
AST: 36 U/L (ref 15–41)
Albumin: 4.2 g/dL (ref 3.5–5.0)
Alkaline Phosphatase: 124 U/L (ref 38–126)
Anion gap: 10 (ref 5–15)
BUN: 11 mg/dL (ref 6–20)
CO2: 25 mmol/L (ref 22–32)
Calcium: 9.6 mg/dL (ref 8.9–10.3)
Chloride: 103 mmol/L (ref 98–111)
Creatinine, Ser: 0.87 mg/dL (ref 0.61–1.24)
GFR calc Af Amer: >60 mL/min (ref >60)
GFR calc non Af Amer: >60 mL/min (ref >60)
Glucose, Bld: 77 mg/dL (ref 70–99)
Potassium: 4.3 mmol/L (ref 3.5–5.1)
Sodium: 138 mmol/L (ref 135–145)
Total Bilirubin: 0.7 mg/dL (ref 0.3–1.2)
Total Protein: 8 g/dL (ref 6.5–8.1)

## 2018-11-25 LAB — CBC
HCT: 51.4 % (ref 39.0–52.0)
Hemoglobin: 16.6 g/dL (ref 13.0–17.0)
MCH: 30.9 pg (ref 26.0–34.0)
MCHC: 32.3 g/dL (ref 30.0–36.0)
MCV: 95.7 fL (ref 80.0–100.0)
PLATELETS: 245 10*3/uL (ref 150–400)
RBC: 5.37 MIL/uL (ref 4.22–5.81)
RDW: 12.9 % (ref 11.5–15.5)
WBC: 9.6 10*3/uL (ref 4.0–10.5)
nRBC: 0 % (ref 0.0–0.2)

## 2018-11-25 LAB — APTT: aPTT: 34 seconds (ref 24–36)

## 2018-11-25 LAB — PROTIME-INR
INR: 1.03
Prothrombin Time: 13.4 seconds (ref 11.4–15.2)

## 2018-11-25 NOTE — Pre-Procedure Instructions (Addendum)
[name]  11/25/2018    Your procedure is scheduled on Monday, December 01, 2018 at 7:30 AM.   Report to [facility] Entrance "A" Admitting Office at 5:30 AM.   Call this number if you have problems the morning of surgery: [phone]   Questions prior to day of surgery, please call [phone] between 8 & 4 PM.   Remember:  Do not eat or drink after midnight _0 .  Wear clean pajamas.  11.  Place clean sheets on your bed the night of your first shower and do not sleep with pets.  Day of Surgery  Shower as above. Do not apply any lotions/deodorants the morning of surgery.   Please wear clean clothes to the hospital. Remember to brush your teeth with toothpaste.   Please read over the fact sheets that you were given.

## 2018-11-25 NOTE — Progress Notes (Signed)
Pt denies cardiac history, HTN or Diabetes. Pt uses Oxygen as needed  PCP -  Cardiologist - none  Chest x-ray - to be done day of surgery EKG - 08/31/18 Stress Test - none ECHO - none Cardiac Cath - none  Sleep Study - n/a CPAP - n/a  Fasting Blood Sugar - n/a Checks Blood Sugar _____ times a day  Blood Thinner Instructions: n/a Aspirin Instructions:  Anesthesia review: no  Patient denies shortness of breath, fever, cough and chest pain at PAT appointment   Patient verbalized understanding of instructions that were given to them at the PAT appointment. Patient was also instructed that they will need to review over the PAT instructions again at home before surgery.

## 2018-11-30 ENCOUNTER — Encounter (HOSPITAL_COMMUNITY): Payer: Self-pay | Admitting: Anesthesiology

## 2018-11-30 NOTE — Anesthesia Preprocedure Evaluation (Addendum)
Anesthesia Evaluation  Patient identified by MRN, date of birth, ID band Patient awake    Reviewed: Allergy & Precautions, NPO status , Patient's Chart, lab work & pertinent test results  Airway Mallampati: II  TM Distance: >3 FB Neck ROM: Full    Dental  (+) Upper Dentures, Lower Dentures   Pulmonary shortness of breath, with exertion and at rest, asthma , COPD,  COPD inhaler, former smoker,  Persistent interbronchial valve leak Hx/o Mycobacterium avium intracellulare- on Ethambutol Hx/o pulmonary aspergillosis with aspergilloma S/P VATS and excision of apical blebs Hx/o tension PTX   breath sounds clear to auscultation + decreased breath sounds+ wheezing      Cardiovascular negative cardio ROS Normal cardiovascular exam Rhythm:Regular Rate:Normal     Neuro/Psych negative neurological ROS  negative psych ROS   GI/Hepatic Neg liver ROS, GERD  ,  Endo/Other  Hypothyroidism S/P ablation for Grave's Disease Malnutrition Hx/o long term steroid use  Renal/GU negative Renal ROS  negative genitourinary   Musculoskeletal negative musculoskeletal ROS (+)   Abdominal (+) + scaphoid   Peds  Hematology  (+) anemia ,   Anesthesia Other Findings   Reproductive/Obstetrics                           Anesthesia Physical Anesthesia Plan  ASA: III  Anesthesia Plan: General   Post-op Pain Management:    Induction: Intravenous  PONV Risk Score and Plan: 3 and Ondansetron, Propofol infusion, Dexamethasone and Treatment may vary due to age or medical condition  Airway Management Planned: Oral ETT  Additional Equipment:   Intra-op Plan:   Post-operative Plan: Extubation in OR  Informed Consent: I have reviewed the patients History and Physical, chart, labs and discussed the procedure including the risks, benefits and alternatives for the proposed anesthesia with the patient or authorized  representative who has indicated his/her understanding and acceptance.   Dental advisory given  Plan Discussed with: CRNA and Surgeon  Anesthesia Plan Comments: (SoluMedrol 33m IV preop)       Anesthesia Quick Evaluation                                  Anesthesia Evaluation  Patient identified by MRN, date of birth, ID band Patient awake    Reviewed: Allergy & Precautions, H&P , NPO status , Patient's Chart, lab work & pertinent test results  Airway Mallampati: II  TM Distance: >3 FB Neck ROM: Full    Dental no notable dental hx. (+) Teeth Intact, Dental Advisory Given   Pulmonary shortness of breath, asthma , COPD, former smoker,    Pulmonary exam normal  + decreased breath sounds      Cardiovascular negative cardio ROS   Rhythm:Regular Rate:Normal     Neuro/Psych negative neurological ROS  negative psych ROS   GI/Hepatic Neg liver ROS, GERD  ,  Endo/Other  Hypothyroidism   Renal/GU negative Renal ROS     Musculoskeletal   Abdominal   Peds  Hematology negative hematology ROS (+) anemia ,   Anesthesia Other Findings   Reproductive/Obstetrics negative OB ROS                             Anesthesia Physical  Anesthesia Plan  ASA: III  Anesthesia Plan: General   Post-op Pain Management:    Induction: Intravenous  PONV Risk  Score and Plan: 3 and Ondansetron, Dexamethasone and Midazolam  Airway Management Planned: Oral ETT  Additional Equipment: None  Intra-op Plan:   Post-operative Plan: Extubation in OR  Informed Consent: I have reviewed the patients History and Physical, chart, labs and discussed the procedure including the risks, benefits and alternatives for the proposed anesthesia with the patient or authorized representative who has indicated his/her understanding and acceptance.   Dental advisory given  Plan Discussed with: CRNA  Anesthesia Plan Comments:         Anesthesia Quick  Evaluation

## 2018-12-01 ENCOUNTER — Ambulatory Visit (HOSPITAL_COMMUNITY): Payer: BLUE CROSS/BLUE SHIELD | Admitting: Certified Registered Nurse Anesthetist

## 2018-12-01 ENCOUNTER — Encounter (HOSPITAL_COMMUNITY)
Admission: RE | Disposition: A | Discharge status: Home / Self Care | Payer: Self-pay | Source: Home / Self Care | Attending: Thoracic Surgery (Cardiothoracic Vascular Surgery)

## 2018-12-01 ENCOUNTER — Ambulatory Visit (HOSPITAL_COMMUNITY)
Admission: RE | Admit: 2018-12-01 | Discharge: 2018-12-01 | Disposition: A | Discharge status: Home / Self Care | Payer: BLUE CROSS/BLUE SHIELD | Attending: Thoracic Surgery (Cardiothoracic Vascular Surgery) | Admitting: Thoracic Surgery (Cardiothoracic Vascular Surgery)

## 2018-12-01 ENCOUNTER — Encounter (HOSPITAL_COMMUNITY): Payer: Self-pay | Admitting: *Deleted

## 2018-12-01 ENCOUNTER — Ambulatory Visit (HOSPITAL_COMMUNITY): Payer: BLUE CROSS/BLUE SHIELD

## 2018-12-01 DIAGNOSIS — Z79890 Hormone replacement therapy (postmenopausal): Secondary | ICD-10-CM | POA: Insufficient documentation

## 2018-12-01 DIAGNOSIS — Z9889 Other specified postprocedural states: Secondary | ICD-10-CM | POA: Diagnosis not present

## 2018-12-01 DIAGNOSIS — J439 Emphysema, unspecified: Secondary | ICD-10-CM | POA: Diagnosis not present

## 2018-12-01 DIAGNOSIS — K219 Gastro-esophageal reflux disease without esophagitis: Secondary | ICD-10-CM | POA: Diagnosis not present

## 2018-12-01 DIAGNOSIS — Z79899 Other long term (current) drug therapy: Secondary | ICD-10-CM | POA: Insufficient documentation

## 2018-12-01 DIAGNOSIS — J9383 Other pneumothorax: Secondary | ICD-10-CM

## 2018-12-01 DIAGNOSIS — Z7951 Long term (current) use of inhaled steroids: Secondary | ICD-10-CM | POA: Diagnosis not present

## 2018-12-01 DIAGNOSIS — J95812 Postprocedural air leak: Secondary | ICD-10-CM | POA: Insufficient documentation

## 2018-12-01 DIAGNOSIS — Z9981 Dependence on supplemental oxygen: Secondary | ICD-10-CM | POA: Diagnosis not present

## 2018-12-01 DIAGNOSIS — Z7952 Long term (current) use of systemic steroids: Secondary | ICD-10-CM | POA: Insufficient documentation

## 2018-12-01 DIAGNOSIS — Z681 Body mass index (BMI) 19 or less, adult: Secondary | ICD-10-CM | POA: Diagnosis not present

## 2018-12-01 DIAGNOSIS — E46 Unspecified protein-calorie malnutrition: Secondary | ICD-10-CM | POA: Insufficient documentation

## 2018-12-01 DIAGNOSIS — Z87891 Personal history of nicotine dependence: Secondary | ICD-10-CM | POA: Insufficient documentation

## 2018-12-01 DIAGNOSIS — E890 Postprocedural hypothyroidism: Secondary | ICD-10-CM | POA: Diagnosis not present

## 2018-12-01 DIAGNOSIS — Z419 Encounter for procedure for purposes other than remedying health state, unspecified: Secondary | ICD-10-CM

## 2018-12-01 HISTORY — PX: VIDEO BRONCHOSCOPY: SHX5072

## 2018-12-01 HISTORY — PX: INSERTION OF IBV VALVE: SHX5091

## 2018-12-01 SURGERY — BRONCHOSCOPY, VIDEO-ASSISTED
Anesthesia: General

## 2018-12-01 MED ORDER — ONDANSETRON HCL 4 MG/2ML IJ SOLN
INTRAMUSCULAR | Status: DC | PRN
  Administered 2018-12-01: 4 mg via INTRAVENOUS

## 2018-12-01 MED ORDER — EPINEPHRINE PF 1 MG/ML IJ SOLN
INTRAMUSCULAR | Status: AC
  Filled 2018-12-01: qty 1

## 2018-12-01 MED ORDER — ROCURONIUM BROMIDE 10 MG/ML (PF) SYRINGE
PREFILLED_SYRINGE | INTRAVENOUS | Status: DC | PRN
  Administered 2018-12-01: 30 mg via INTRAVENOUS
  Administered 2018-12-01: 50 mg via INTRAVENOUS

## 2018-12-01 MED ORDER — ROCURONIUM BROMIDE 50 MG/5ML IV SOSY
PREFILLED_SYRINGE | INTRAVENOUS | Status: AC
  Filled 2018-12-01: qty 10

## 2018-12-01 MED ORDER — MEPERIDINE HCL 50 MG/ML IJ SOLN: 6.2500 mg | INTRAMUSCULAR | Status: DC | PRN

## 2018-12-01 MED ORDER — LACTATED RINGERS IV SOLN
INTRAVENOUS | Status: DC | PRN
  Administered 2018-12-01: 07:00:00 via INTRAVENOUS

## 2018-12-01 MED ORDER — SUGAMMADEX SODIUM 200 MG/2ML IV SOLN
INTRAVENOUS | Status: DC | PRN
  Administered 2018-12-01: 101.6 mg via INTRAVENOUS

## 2018-12-01 MED ORDER — SODIUM CHLORIDE 0.9 % IV SOLN
INTRAVENOUS | Status: DC | PRN
  Administered 2018-12-01: 25 ug/min via INTRAVENOUS

## 2018-12-01 MED ORDER — FENTANYL CITRATE (PF) 250 MCG/5ML IJ SOLN
INTRAMUSCULAR | Status: AC
  Filled 2018-12-01: qty 5

## 2018-12-01 MED ORDER — MIDAZOLAM HCL 2 MG/2ML IJ SOLN
INTRAMUSCULAR | Status: AC
  Filled 2018-12-01: qty 2

## 2018-12-01 MED ORDER — FENTANYL CITRATE (PF) 100 MCG/2ML IJ SOLN: 25.0000 ug | INTRAMUSCULAR | Status: DC | PRN

## 2018-12-01 MED ORDER — OXYCODONE HCL 5 MG/5ML PO SOLN: 5.0000 mg | Freq: Once | ORAL | Status: DC | PRN

## 2018-12-01 MED ORDER — MIDAZOLAM HCL 2 MG/2ML IJ SOLN
INTRAMUSCULAR | Status: DC | PRN
  Administered 2018-12-01: 2 mg via INTRAVENOUS

## 2018-12-01 MED ORDER — LIDOCAINE 2% (20 MG/ML) 5 ML SYRINGE
INTRAMUSCULAR | Status: AC
  Filled 2018-12-01: qty 5

## 2018-12-01 MED ORDER — FENTANYL CITRATE (PF) 250 MCG/5ML IJ SOLN
INTRAMUSCULAR | Status: DC | PRN
  Administered 2018-12-01: 50 ug via INTRAVENOUS
  Administered 2018-12-01: 150 ug via INTRAVENOUS
  Administered 2018-12-01: 50 ug via INTRAVENOUS

## 2018-12-01 MED ORDER — ESMOLOL HCL 100 MG/10ML IV SOLN
INTRAVENOUS | Status: DC | PRN
  Administered 2018-12-01 (×5): 20 mg via INTRAVENOUS

## 2018-12-01 MED ORDER — DEXAMETHASONE SODIUM PHOSPHATE 10 MG/ML IJ SOLN
INTRAMUSCULAR | Status: DC | PRN
  Administered 2018-12-01: 10 mg via INTRAVENOUS

## 2018-12-01 MED ORDER — ALBUTEROL SULFATE (2.5 MG/3ML) 0.083% IN NEBU
INHALATION_SOLUTION | RESPIRATORY_TRACT | Status: AC
  Administered 2018-12-01: 2.5 mg via RESPIRATORY_TRACT
  Filled 2018-12-01: qty 3

## 2018-12-01 MED ORDER — LIDOCAINE 2% (20 MG/ML) 5 ML SYRINGE
INTRAMUSCULAR | Status: DC | PRN
  Administered 2018-12-01: 60 mg via INTRAVENOUS

## 2018-12-01 MED ORDER — PROPOFOL 10 MG/ML IV BOLUS
INTRAVENOUS | Status: DC | PRN
  Administered 2018-12-01: 130 mg via INTRAVENOUS
  Administered 2018-12-01: 20 mg via INTRAVENOUS
  Administered 2018-12-01: 30 mg via INTRAVENOUS
  Administered 2018-12-01: 20 mg via INTRAVENOUS

## 2018-12-01 MED ORDER — 0.9 % SODIUM CHLORIDE (POUR BTL) OPTIME
TOPICAL | Status: DC | PRN
  Administered 2018-12-01 (×2): 1000 mL

## 2018-12-01 MED ORDER — EPINEPHRINE PF 1 MG/ML IJ SOLN
INTRAMUSCULAR | Status: DC | PRN
  Administered 2018-12-01 (×2): 1 mg via ENDOTRACHEOPULMONARY

## 2018-12-01 MED ORDER — OXYCODONE HCL 5 MG PO TABS: 5.0000 mg | ORAL_TABLET | Freq: Once | ORAL | Status: DC | PRN

## 2018-12-01 MED ORDER — PROPOFOL 10 MG/ML IV BOLUS
INTRAVENOUS | Status: AC
  Filled 2018-12-01: qty 20

## 2018-12-01 MED ORDER — ALBUTEROL SULFATE (2.5 MG/3ML) 0.083% IN NEBU
2.5000 mg | INHALATION_SOLUTION | Freq: Once | RESPIRATORY_TRACT | Status: AC
  Administered 2018-12-01: 2.5 mg via RESPIRATORY_TRACT

## 2018-12-01 SURGICAL SUPPLY — 42 items
ADAPTER VALVE BIOPSY EBUS (MISCELLANEOUS) IMPLANT
ADPTR VALVE BIOPSY EBUS (MISCELLANEOUS)
BRUSH CYTOL CELLEBRITY 1.5X140 (MISCELLANEOUS) IMPLANT
CANISTER SUCT 3000ML PPV (MISCELLANEOUS) ×3 IMPLANT
CATH EMB 4FR 80CM (CATHETERS) IMPLANT
CATH EMB 5FR 80CM (CATHETERS) IMPLANT
CATH EMB 6FR 80CM (CATHETERS) IMPLANT
CONT SPEC 4OZ CLIKSEAL STRL BL (MISCELLANEOUS) IMPLANT
COVER BACK TABLE 60X90IN (DRAPES) ×3 IMPLANT
COVER WAND RF STERILE (DRAPES) IMPLANT
FILTER STRAW FLUID ASPIR (MISCELLANEOUS) ×6 IMPLANT
FORCEPS BIOP RJ4 1.8 (CUTTING FORCEPS) ×3 IMPLANT
FORCEPS RADIAL JAW LRG 4 PULM (INSTRUMENTS) ×1 IMPLANT
GAUZE SPONGE 4X4 12PLY STRL (GAUZE/BANDAGES/DRESSINGS) ×3 IMPLANT
GLOVE SURG SIGNA 7.5 PF LTX (GLOVE) ×3 IMPLANT
GOWN STRL REUS W/ TWL LRG LVL3 (GOWN DISPOSABLE) ×1 IMPLANT
GOWN STRL REUS W/ TWL XL LVL3 (GOWN DISPOSABLE) ×1 IMPLANT
GOWN STRL REUS W/TWL LRG LVL3 (GOWN DISPOSABLE) ×2
GOWN STRL REUS W/TWL XL LVL3 (GOWN DISPOSABLE) ×2
KIT CLEAN ENDO COMPLIANCE (KITS) ×3 IMPLANT
KIT TURNOVER KIT B (KITS) ×9 IMPLANT
MARKER SKIN DUAL TIP RULER LAB (MISCELLANEOUS) ×3 IMPLANT
NS IRRIG 1000ML POUR BTL (IV SOLUTION) ×3 IMPLANT
OIL SILICONE PENTAX (PARTS (SERVICE/REPAIRS)) ×3 IMPLANT
PAD ARMBOARD 7.5X6 YLW CONV (MISCELLANEOUS) ×6 IMPLANT
RADIAL JAW LRG 4 PULMONARY (INSTRUMENTS) ×2
SNARE SHORT THROW 13M SML OVAL (MISCELLANEOUS) ×3 IMPLANT
STOPCOCK MORSE 400PSI 3WAY (MISCELLANEOUS) IMPLANT
SYR 10ML LL (SYRINGE) ×3 IMPLANT
SYR 20ML ECCENTRIC (SYRINGE) ×6 IMPLANT
SYR 5ML LL (SYRINGE) ×3 IMPLANT
SYR 5ML LUER SLIP (SYRINGE) ×3 IMPLANT
TOWEL GREEN STERILE (TOWEL DISPOSABLE) ×3 IMPLANT
TOWEL GREEN STERILE FF (TOWEL DISPOSABLE) ×3 IMPLANT
TOWEL NATURAL 4PK STERILE (DISPOSABLE) IMPLANT
TRAP SPECIMEN MUCOUS 40CC (MISCELLANEOUS) IMPLANT
TUBE CONNECTING 20'X1/4 (TUBING) ×1
TUBE CONNECTING 20X1/4 (TUBING) ×2 IMPLANT
UNDERPAD 30X30 (UNDERPADS AND DIAPERS) ×3 IMPLANT
VALVE BIOPSY  SINGLE USE (MISCELLANEOUS) ×6
VALVE BIOPSY SINGLE USE (MISCELLANEOUS) ×3 IMPLANT
VALVE SUCTION BRONCHIO DISP (MISCELLANEOUS) ×3 IMPLANT

## 2018-12-01 NOTE — Anesthesia Procedure Notes (Signed)
Procedure Name: Intubation Date/Time: 12/01/2018 7:39 AM Performed by: Alain Marion, CRNA Pre-anesthesia Checklist: Patient identified, Emergency Drugs available, Suction available and Patient being monitored Patient Re-evaluated:Patient Re-evaluated prior to induction Oxygen Delivery Method: Circle System Utilized Preoxygenation: Pre-oxygenation with 100% oxygen Induction Type: IV induction Ventilation: Mask ventilation without difficulty Laryngoscope Size: Miller and 2 Grade View: Grade I Tube type: Oral Tube size: 8.5 mm Number of attempts: 1 Airway Equipment and Method: Stylet and Oral airway Placement Confirmation: ETT inserted through vocal cords under direct vision,  positive ETCO2 and breath sounds checked- equal and bilateral Secured at: 22 cm Tube secured with: Tape Dental Injury: Teeth and Oropharynx as per pre-operative assessment

## 2018-12-01 NOTE — Brief Op Note (Signed)
12/01/2018  10:10 AM  PATIENT:  Katheren Puller  43 y.o. male  PRE-OPERATIVE DIAGNOSIS:  PROLONGED AIR LEAK- FOR REMOVAL OF IBV POST-OPERATIVE DIAGNOSIS:  PROLONGED AIR LEAK- FOR REMOVAL OF IBV  PROCEDURE:  Procedure(s): VIDEO BRONCHOSCOPY (N/A) REMOVAL OF INTERBRONCHIAL VALVE (IBV) (N/A)  SURGEON:  Surgeon(s) and Role:    * Melrose Nakayama, MD - Primary  PHYSICIAN ASSISTANT:   ASSISTANTS: none   ANESTHESIA:   general  EBL:  5 mL   BLOOD ADMINISTERED:none  DRAINS: none   LOCAL MEDICATIONS USED:  NONE  SPECIMEN:  Source of Specimen:  bronchial valves  DISPOSITION OF SPECIMEN:  N/A  COUNTS:  NO Endo  TOURNIQUET:  * No tourniquets in log *  DICTATION: .Other Dictation: Dictation Number -  PLAN OF CARE: Discharge to home after PACU  PATIENT DISPOSITION:  PACU - hemodynamically stable.   Delay start of Pharmacological VTE agent (>24hrs) due to surgical blood loss or risk of bleeding: not applicable

## 2018-12-01 NOTE — Transfer of Care (Signed)
Immediate Anesthesia Transfer of Care Note  Patient: [name]  Procedure(s) Performed: VIDEO BRONCHOSCOPY (N/A ) REMOVAL OF INTERBRONCHIAL VALVE (IBV) (N/A )  Patient Location: PACU  Anesthesia Type:General  Level of Consciousness: awake, alert  and oriented  Airway & Oxygen Therapy: Patient Spontanous Breathing and Patient connected to face mask oxygen  Post-op Assessment: Report given to RN and Post -op Vital signs reviewed and stable  Post vital signs: Reviewed and stable  Last Vitals:  Vitals Value Taken Time  BP 140/110 12/01/2018 10:12 AM  Temp    Pulse 119 12/01/2018 10:22 AM  Resp 22 12/01/2018 10:22 AM  SpO2 93 % 12/01/2018 10:22 AM  Vitals shown include unvalidated device data.  Last Pain:  Vitals:   12/01/18 0607  TempSrc:   PainSc: 0-No pain      Patients Stated Pain Goal: 8 (01/09/78 8102)  Complications: No apparent anesthesia complications

## 2018-12-01 NOTE — Interval H&P Note (Signed)
History and Physical Interval Note:  12/01/2018 7:27 AM  [name]  has presented today for surgery, with the diagnosis of PROLONGED AIRLEAK  The various methods of treatment have been discussed with the patient and family. After consideration of risks, benefits and other options for treatment, the patient has consented to  Procedure(s): VIDEO BRONCHOSCOPY (N/A) REMOVAL OF INTERBRONCHIAL VALVE (IBV) (N/A) as a surgical intervention .  The patient's history has been reviewed, patient examined, no change in status, stable for surgery.  I have reviewed the patient's chart and labs.  Questions were answered to the patient's satisfaction.     Melrose Nakayama

## 2018-12-01 NOTE — Anesthesia Postprocedure Evaluation (Signed)
Anesthesia Post Note  Patient: [name]  Procedure(s) Performed: VIDEO BRONCHOSCOPY (N/A ) REMOVAL OF INTERBRONCHIAL VALVE (IBV) (N/A )     Patient location during evaluation: PACU Anesthesia Type: General Level of consciousness: awake and alert and oriented Pain management: pain level controlled Vital Signs Assessment: post-procedure vital signs reviewed and stable Respiratory status: spontaneous breathing, nonlabored ventilation, respiratory function stable and patient connected to nasal cannula oxygen Cardiovascular status: blood pressure returned to baseline and stable Postop Assessment: no apparent nausea or vomiting Anesthetic complications: no Comments: He is currently being weaned from O2. He is stable for discharge to Phase II.    Last Vitals:  Vitals:   12/01/18 1130 12/01/18 1135  BP: 118/86   Pulse: (!) 106 (!) 128  Resp: (!) 23 (!) 26  Temp:    SpO2: 97% 94%    Last Pain:  Vitals:   12/01/18 1130  TempSrc:   PainSc: 0-No pain                 Renatta Shrieves A.

## 2018-12-01 NOTE — Op Note (Signed)
NAME: [name], [name] MEDICAL RECORD [id number] ACCOUNT [id number] DATE OF BIRTH:[birth year] FACILITY: [facility] LOCATION: [place], [place]  OPERATIVE REPORT  DATE OF PROCEDURE:  12/01/2018  PREOPERATIVE DIAGNOSIS:  Status post intrabronchial valve placement for prolonged air leak.  POSTOPERATIVE DIAGNOSIS:  Status post intrabronchial valve placement for prolonged air leak.  PROCEDURE:  Bronchoscopy for removal of intrabronchial valves.  SURGEON:  Modesto Charon, MD  ASSISTANT:  None.  ANESTHESIA:  General.  FINDINGS:  All valves were removed intact.  Extensive granulation tissue around the valves.  CLINICAL NOTE:  The patient is a 43 year old gentleman who had presented with spontaneous pneumothorax last fall.  He had undergone VATS for resection of multiple blebs and had a prolonged air leak postoperatively.  Intrabronchial valves had been placed  and ultimately controlled the air leak.  He now returns for removal of the intrabronchial valves.  The indications, risks, benefits, and alternatives were discussed in detail with the patient.  He understood and accepted the risks and agreed to proceed.  DESCRIPTION OF PROCEDURE:  The patient was brought to the operating room on 12/01/2018.  He had induction of general anesthesia and was intubated.  A timeout was performed.  Flexible fiberoptic bronchoscopy was performed via the endotracheal tube.  There  were some secretions in the right tracheobronchial tree which cleared with saline.  Valves in the anterior and posterior basilar segmental airways were easily identified and removed without difficulty.  The superior segmental valve also was removed.   There was extensive granulation tissue around this, and some of the granulation tissue was removed with the biopsy forceps before removing the valve.  There was bleeding with valve removal, and the field was cleared with irrigation with saline and also a  dilute  epinephrine solution.  The middle lobe bronchus was slit-like in shape.  There was extensive granulation tissue around that area, and this was debrided until the valve could be visualized.  The valve was removed intact.  The 2 mm scope was used  to advance past the origin of the bronchi and look at the distal airways, which were clear in all these cases.  The remaining valves, which turned out to be in the medial basilar segment of the lower lobe, were not visible.  The origin of the medial  lower lobe bronchus was not visible either.  Granulation tissue over this area was debrided, but there was poor visualization.  Fluoroscopy was brought into the room, and attempts were made to grab the stem of the valve with fluoroscopic guidance from  both an AP and RAO view.  Multiple attempts were necessary, but ultimately the valve was removed intact.  A total of 8 minutes of fluoroscopy time was used.  The airway was copiously irrigated with saline.  There was no ongoing bleeding.  The  bronchoscope was removed.  The patient then was extubated in the operating room and taken to the [facility] in good condition.  LN/NUANCE  D:12/01/2018 T:12/01/2018 JOB:[id number]

## 2018-12-02 ENCOUNTER — Encounter (HOSPITAL_COMMUNITY): Payer: Self-pay | Admitting: Thoracic Surgery (Cardiothoracic Vascular Surgery)

## 2018-12-03 ENCOUNTER — Telehealth: Payer: Self-pay

## 2018-12-03 NOTE — Telephone Encounter (Signed)
Patient contacted the office concerned about a dry cough that he has developed after his Bronchoscopy and removal of interbronchial valve by Dr. Roxan Hockey on 12/01/2018.  He states that the cough is dry but sometimes produces slight yellowish mucous.  I advised him that after a bronchoscopy the airways can be irritated and a cough is not uncommon.  I did advised him to contact the office again if it did not improve over the next couple days.  He acknowledged receipt.  Also advised that if he has increase drainage when coughing he should contact his PCP to rule out an infection.  He acknowledged receipt.

## 2018-12-05 ENCOUNTER — Telehealth: Payer: Self-pay | Admitting: Adult Health

## 2018-12-05 MED ORDER — TIOTROPIUM BROMIDE MONOHYDRATE 2.5 MCG/ACT IN AERS: 2.0000 | INHALATION_SPRAY | Freq: Every day | RESPIRATORY_TRACT | 0 refills | Status: DC

## 2018-12-05 NOTE — Telephone Encounter (Signed)
Received faxed refill request from [facility] in [facility] for patient's Spiriva - request had note that Spiriva is on back-order and requesting a change in therapy.  Called spoke with patient to discuss the above - advised patient that we can provide samples to help until Spiriva can be obtained by the pharmacy.  Pt to come pick up samples at his convenience.  2 samples obtained and documented per protocol.  Called [facility] and spoke with [place] and informed the pharmacy of the above. Nothing further needed; will sign off.

## 2018-12-15 ENCOUNTER — Other Ambulatory Visit: Payer: Self-pay | Admitting: Thoracic Surgery (Cardiothoracic Vascular Surgery)

## 2018-12-15 DIAGNOSIS — J449 Chronic obstructive pulmonary disease, unspecified: Secondary | ICD-10-CM

## 2018-12-16 ENCOUNTER — Ambulatory Visit: Payer: BLUE CROSS/BLUE SHIELD | Admitting: Thoracic Surgery (Cardiothoracic Vascular Surgery)

## 2018-12-16 ENCOUNTER — Ambulatory Visit
Admission: RE | Admit: 2018-12-16 | Discharge: 2018-12-16 | Disposition: A | Discharge status: Home / Self Care | Payer: BLUE CROSS/BLUE SHIELD | Source: Ambulatory Visit | Attending: Thoracic Surgery (Cardiothoracic Vascular Surgery) | Admitting: Thoracic Surgery (Cardiothoracic Vascular Surgery)

## 2018-12-16 ENCOUNTER — Ambulatory Visit: Payer: BLUE CROSS/BLUE SHIELD | Admitting: Infectious Disease

## 2018-12-16 VITALS — BP 101/71 | HR 89 | Temp 98.0°F | Wt 110.1 lb

## 2018-12-16 VITALS — BP 116/80 | HR 82 | Resp 20 | Ht 68.0 in | Wt 111.0 lb

## 2018-12-16 DIAGNOSIS — J449 Chronic obstructive pulmonary disease, unspecified: Secondary | ICD-10-CM

## 2018-12-16 DIAGNOSIS — IMO0002 Reserved for concepts with insufficient information to code with codable children: Secondary | ICD-10-CM

## 2018-12-16 DIAGNOSIS — J9382 Other air leak: Secondary | ICD-10-CM

## 2018-12-16 DIAGNOSIS — B449 Aspergillosis, unspecified: Secondary | ICD-10-CM | POA: Diagnosis not present

## 2018-12-16 DIAGNOSIS — R634 Abnormal weight loss: Secondary | ICD-10-CM | POA: Diagnosis not present

## 2018-12-16 DIAGNOSIS — Z09 Encounter for follow-up examination after completed treatment for conditions other than malignant neoplasm: Secondary | ICD-10-CM

## 2018-12-16 DIAGNOSIS — J930 Spontaneous tension pneumothorax: Secondary | ICD-10-CM

## 2018-12-16 DIAGNOSIS — B440 Invasive pulmonary aspergillosis: Secondary | ICD-10-CM | POA: Diagnosis not present

## 2018-12-16 DIAGNOSIS — B4481 Allergic bronchopulmonary aspergillosis: Secondary | ICD-10-CM

## 2018-12-16 DIAGNOSIS — A310 Pulmonary mycobacterial infection: Secondary | ICD-10-CM

## 2018-12-16 DIAGNOSIS — L814 Other melanin hyperpigmentation: Secondary | ICD-10-CM

## 2018-12-16 DIAGNOSIS — L568 Other specified acute skin changes due to ultraviolet radiation: Secondary | ICD-10-CM

## 2018-12-16 NOTE — Progress Notes (Signed)
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  He presented in October with a spontaneous pneumothorax.  We tried to manage him with a chest tube initially but he had a recurrence.  I did a right VATS for bleb resection on 09/12/2017.  He had diffuse emphysematous changes in the lungs.  He had a significant air leak postoperatively.  I placed intrabronchial valves on 09/29/2018 and then again on 10/01/2017.  Took about a week but his air leak ultimately resolved.  On 12/01/2018 I took him back to the operating room and removed the intrabronchial valves.  He tolerated that procedure well.  He says that for the first week after we remove the valves he had a lot of wheezing and coughing, but that has settled down significantly.  He is not having any fevers or chills.  Past Medical History:  Diagnosis Date  . Anemia   . 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 Drug rash 12/16/2017  . Dyspnea   . Esophageal reflux    very rare  . 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 Lung disease, bullous ([place])   . MAI (mycobacterium avium-intracellulare) ([place])   . Mold exposure 12/05/2015  . 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 Solar lentigo 08/08/2015  . Weight loss 10/15/2016    Current Outpatient Medications  Medication Sig Dispense Refill  . albuterol (PROVENTIL HFA;VENTOLIN HFA) 108 (90 Base) MCG/ACT inhaler Inhale 2 puffs into the lungs every 6 (six) hours as needed. (Patient taking differently: Inhale 2 puffs into the lungs every 6 (six) hours as needed for wheezing or shortness of breath. ) 1 Inhaler 4  . Calcium Carbonate-Vitamin D 600-400 MG-UNIT tablet Take 1 tablet by mouth daily.     Marland Kitchen EPINEPHrine 0.3 mg/0.3 mL IJ SOAJ injection Inject 0.3 mLs (0.3 mg total) into the  muscle once. 1 Device 11  . ethambutol (MYAMBUTOL) 400 MG tablet TAKE TWO AND ONE-HALF TABLETS BY MOUTH ONCE DAILY (Patient taking differently: Take 1,000 mg by mouth daily. ) 225 tablet 5  . feeding supplement, ENSURE ENLIVE, (ENSURE ENLIVE) LIQD Take 237 mLs by mouth 3 (three) times daily between meals. (Patient taking differently: Take 237 mLs by mouth 2 (two) times daily between meals. ) 237 mL 12  . ferrous sulfate (FERRO-BOB) 325 (65 FE) MG tablet Take 325 mg by mouth daily.      . fluticasone furoate-vilanterol (BREO ELLIPTA) 100-25 MCG/INH AEPB Inhale 1 puff into the lungs daily. 1 each 5  . guaiFENesin (MUCINEX) 600 MG 12 hr tablet Take 1 tablet (600 mg total) by mouth 2 (two) times daily. 30 tablet 0  . Isavuconazonium Sulfate 186 MG CAPS Take 2 capsules (372 mg total) by mouth daily. 60 capsule 2  . levothyroxine (SYNTHROID, LEVOTHROID) 75 MCG tablet Take 75 mcg by mouth daily before breakfast.   0  . predniSONE (DELTASONE) 5 MG tablet TAKE 1 TABLET BY MOUTH ONCE DAILY WITH BREAKFAST (Patient taking differently: Take 5 mg by mouth daily with breakfast. ) 90 tablet 1  . Tiotropium Bromide Monohydrate (SPIRIVA RESPIMAT) 2.5 MCG/ACT AERS Inhale 2 puffs into the lungs daily. 1 Inhaler 3  . Tiotropium Bromide Monohydrate (  SPIRIVA RESPIMAT) 2.5 MCG/ACT AERS Inhale 2 puffs into the lungs daily. 2 Inhaler 0   No current facility-administered medications for this visit.     Physical Exam BP 116/80   Pulse 82   Resp 20   Ht _0  (1.727 m)   Wt 111 lb (50.3 kg)   SpO2 97% Comment: RA  BMI 16.46 kg/m  43 year old man in no acute distress Alert and oriented x3 with no focal deficits Diminished breath sounds bilaterally, no wheezing Incisions well-healed  Diagnostic Tests: CHEST - 2 VIEW  COMPARISON:  12/01/2018 and older exams.  FINDINGS: Severe upper lobe bullous emphysema with associated scarring is stable. Pulmonary anastomosis staples lie along the right upper lobe, also  unchanged. There is a small amount opacity the posterior lung base on the lateral view, most likely in the right lower lobe, which is likely atelectasis, new since the prior exam. No convincing pneumonia. No other change in the appearance of the lungs.  Cardiac silhouette is normal in size. No mediastinal or hilar masses.  Mild compression deformities of midthoracic vertebra are stable. No acute skeletal abnormality  IMPRESSION: 1. There is a small amount of opacity at the posterior lung base on the lateral view, most likely the right lower lobe, which is likely due to atelectasis. No convincing pneumonia and no pulmonary edema. 2. Stable extensive upper lobe bullous emphysema and scarring, unchanged from the recent prior exams. No pneumothorax.   Electronically Signed   By: Lajean Manes M.D.   On: 12/16/2018 15:25 I personally reviewed the chest x-ray images and concur with the findings noted above.  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  He presented with a right spontaneous pneumothorax in October.  He was initially managed with a chest tube but within 24 hours after removal had a recurrent pneumothorax.  He had a right VATS for bleb resection on 09/12/2018.  Postoperative course was complicated by prolonged air leak.  He eventually required intrabronchial valves.  Ultimately his chest tube was removed and he was discharged about a week after the second set of valves was placed.  I remove those valves about 2 weeks ago.  It was difficult due to granulation tissue and narrowing of some of the airways.  All 5 the valves were removed.  He had some significant coughing and wheezing afterwards likely due to edema from the trauma of removing the valves.  That has improved significantly over the past week.  Currently he is doing quite well.  He does not have any incisional pain.  His respiratory status is  not back to normal yet, but should return to that over time as he recovers from his procedures.  Plan: I will be happy to see [name] back ay anytime in the future if I can be of any further assistance with his care  Melrose Nakayama, MD [facility] [phone]

## 2018-12-16 NOTE — Progress Notes (Signed)
Chief complaint: Feeling fatigued  Subjective:  Chief complaint:  : Patient ID: [name], male    DOB: 1976-01-18, 43 y.o.   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 We had taken him off his Mycobacterium avium drugs and then he developed fevers chills and malaise  Spring (2013) and was seen by my partner Dr. Megan Farmer in late April  His AFB smears and cultures obtained in the clinic on that day prior to the initiation of azithromycin ethambutol ultimately failed to grow Mycobacterium avium. He had been doing well I saw him in [place] 2013 we discontinued his azithromycin and ethambutol again. Unfortunately he again shortly thereafter developed fevers cough malaise. He started back on azithromycin ethambutol. Cultures done for AFB here were again negative. The patient however felt dramatically better having been back on azithromycin and ethambutol.   I last saw him in January of 2015 when he was feeling relatively well.  He did have an episode of coughing up a few flecks of blood in January 2016 which prompted appt with [facility] but by time he made appt this had long since resolved. Was only a one time episode none since then.  I saw again in March and then in September 2016. In summer he was hospitalized with hemoptysis and found to have an area in the upper lobe consistent with an aspergilloma. He underwent bronchoscopy with BAL and cultures ultimately yielded a Saccharomyces species on fungal culture. His serum galactomannan was negative.  He has been  on voriconazole and axial has had radiographic improvement and his x-ray findings. He also feelt etter symptomatically with no more  hemoptysis improvement in his cough improvement in his energy though he still feels not nearly as well as he did prior to his hospitalization. He is avoiding exercised during the winter as it often precipitates coughing spells. He was  only taking 200 mg twice a day of voriconazole rather than the 300 mg he has continued this along with his ethambutol and azithromycin. He continued to have cough more so with exercise and has not been exercising at "[employer]" due to the fact this elicits more coughing. He has not smoked since 2007. He is seeing Dr. Chase Farmer and is being considered for new drug infusion to treat his COPD.  He did have 20# weight loss that he attributed to increased exercise.  He then saw  Dr. Chase Farmer after an episode of hematemesis. 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 He said he only coughed up a small amount of blood material and did not go to the ER but was seen and followed by pulmonary. Had a chest x-ray which showed no new findings.  He was going to be evaluated by the transplant team at [facility].  He did complain of hyperpigmented spots on sun exposed areas on his body including arms and neck which his dermatologist have told him is due to his voriconazole use.   We considered change at that time  Since that time he has Sch[place] been admitted with hemoptysis at [facility].   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 There is interval development of fluid  density in several bulla in the left upper lobe concerning for superimposed infection, possibly fungal in origin.  Stable bilateral calcified pulmonary nodules are noted consistent with prior granulomatous disease.  Stable tree in bud appearance is noted medially in the left lung posteriorly consistent with sequela of previous atypical infection.  Sun exposed skin rash persists worse in sun and he tries to cover arms with long  sleeved shirts.   Due to his skin toxicity we decided to change him to [facility] and he is on that now .  Since I last saw [name] he has had multiple problems related to pneumothoraces.  This required placement of an intrabronchial valve in November 2019.  This resolved but then he had to be taken back to the operating room in early January where the intrabronchial valves were removed.  Just seen Dr. Roxan Farmer.  He tells me that he has put in for disability though he is contemplating returning to work.  He previously was working 80 hours a week which is certainly unreasonable for someone in his condition.  He is lost a fair amount of weight also since I last saw him.  Past Medical History:  Diagnosis Date  . Anemia   . 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[place])   . Drug rash 12/16/2017  . Dyspnea   . Esophageal reflux    very rare  . 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 Lung disease, bullous ([place])   . MAI (mycobacterium avium-intracellulare) ([place])   . Mold exposure 12/05/2015  . 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 Solar lentigo 08/08/2015  . Weight loss 10/15/2016   Past Surgical History:  Procedure Laterality Date  . CHEST TUBE INSERTION Right 09/29/2018   Procedure: CHEST TUBE REPLACEMENT;  Surgeon: Melrose Nakayama, MD;  Location: [place];  Service: Thoracic;  Laterality: Right;  . INSERTION OF IBV VALVE N/A 09/19/2018   Procedure: INSERTION OF INTERBRONCHIAL VALVE (IBV);  Surgeon: Melrose Nakayama, MD;  Location: [facility] OR;  Service: Thoracic;  Laterality: N/A;  . INSERTION OF IBV VALVE N/A 12/01/2018   Procedure: REMOVAL OF INTERBRONCHIAL VALVE (IBV);  Surgeon: Melrose Nakayama, MD;  Location[facility] OR;  Service: Thoracic;  Laterality: N/A;  . left VATS  2012   thoracotomy and LUL apical posterior segmentectomy  . STAPLING OF BLEBS Right 09/12/2018   Procedure: STAPLING OF BLEBS, right lower and middle lung lobes;  Surgeon: Melrose Nakayama, MD;  Location: [place];  Service: Thoracic;  Laterality: Right;  Marland Kitchen VIDEO ASSISTED THORACOSCOPY Right 09/12/2018   Procedure: VIDEO ASSISTED THORACOSCOPY, right lung;  Surgeon: Melrose Nakayama, MD;  Location: [place];  Service: Thoracic;  Laterality: Right;  Marland Kitchen VIDEO BRONCHOSCOPY Bilateral 10/20/2015   Procedure: VIDEO BRONCHOSCOPY WITHOUT FLUORO;  Surgeon: Marshell Garfinkel, MD;  Location: [place];  Service: Cardiopulmonary;  Laterality: Bilateral;  . VIDEO BRONCHOSCOPY N/A 09/19/2018   Procedure: VIDEO BRONCHOSCOPY;  Surgeon: Melrose Nakayama, MD;  Location: [facility] OR;  Service: Thoracic;  Laterality: N/A;  . VIDEO BRONCHOSCOPY N/A 12/01/2018   Procedure: VIDEO BRONCHOSCOPY;  Surgeon: Melrose Nakayama, MD;  Location: [place];  Service: Thoracic;  Laterality: N/A;  . VIDEO BRONCHOSCOPY WITH INSERTION OF INTERBRONCHIAL VALVE (IBV) N/A 09/29/2018   Procedure: VIDEO BRONCHOSCOPY WITH INSERTION OF INTERBRONCHIAL VALVE  (IBV);  Surgeon: Melrose Nakayama, MD;  Location[facility] OR;  Service: Thoracic;  Laterality: N/A;   Family History  Adopted: Yes   Social History   Tobacco Use  . Smoking  status: Former Smoker    Packs/day: 0.70    Years: 19.00    Pack years: 13.30    Types: Cigarettes    Last attempt to quit: 11/20/2007    Years since quitting: 11.0  . Smokeless tobacco: Former Systems developer    Types: Snuff    Quit date: 11/19/1994  Substance Use Topics  . Alcohol use: No  . Drug use: No    Allergies  Allergen Reactions  . Clarithromycin Other (See Comments)    Adverse reaction w/ voriconazole UNSPECIFIED REACTION   . Rifaximin Other (See Comments)    Adverse reaction w/ voriconazole UNSPECIFIED REACTION     Current Outpatient Medications:  .  albuterol (PROVENTIL HFA;VENTOLIN HFA) 108 (90 Base) MCG/ACT inhaler, Inhale 2 puffs into the lungs every 6 (six) hours as needed. (Patient taking differently: Inhale 2 puffs into the lungs every 6 (six) hours as needed for wheezing or shortness of breath. ),  Disp: 1 Inhaler, Rfl: 4 .  Calcium Carbonate-Vitamin D 600-400 MG-UNIT tablet, Take 1 tablet by mouth daily. , Disp: , Rfl:  .  EPINEPHrine 0.3 mg/0.3 mL IJ SOAJ injection, Inject 0.3 mLs (0.3 mg total) into the muscle once., Disp: 1 Device, Rfl: 11 .  ethambutol (MYAMBUTOL) 400 MG tablet, TAKE TWO AND ONE-HALF TABLETS BY MOUTH ONCE DAILY (Patient taking differently: Take 1,000 mg by mouth daily. ), Disp: 225 tablet, Rfl: 5 .  feeding supplement, ENSURE ENLIVE, (ENSURE ENLIVE) LIQD, Take 237 mLs by mouth 3 (three) times daily between meals. (Patient taking differently: Take 237 mLs by mouth 2 (two) times daily between meals. ), Disp: 237 mL, Rfl: 12 .  ferrous sulfate (FERRO-BOB) 325 (65 FE) MG tablet, Take 325 mg by mouth daily.  , Disp: , Rfl:  .  fluticasone furoate-vilanterol (BREO ELLIPTA) 100-25 MCG/INH AEPB, Inhale 1 puff into the lungs daily., Disp: 1 each, Rfl: 5 .  guaiFENesin (MUCINEX) 600 MG 12 hr tablet, Take 1 tablet (600 mg total) by mouth 2 (two) times daily., Disp: 30 tablet, Rfl: 0 .  Isavuconazonium Sulfate 186 MG CAPS, Take 2 capsules (372 mg total) by mouth daily., Disp: 60 capsule, Rfl: 2 .  levothyroxine (SYNTHROID, LEVOTHROID) 75 MCG tablet, Take 75 mcg by mouth daily before breakfast. , Disp: , Rfl: 0 .  predniSONE (DELTASONE) 5 MG tablet, TAKE 1 TABLET BY MOUTH ONCE DAILY WITH BREAKFAST (Patient taking differently: Take 5 mg by mouth daily with breakfast. ), Disp: 90 tablet, Rfl: 1 .  Tiotropium Bromide Monohydrate (SPIRIVA RESPIMAT) 2.5 MCG/ACT AERS, Inhale 2 puffs into the lungs daily., Disp: 1 Inhaler, Rfl: 3 .  Tiotropium Bromide Monohydrate (SPIRIVA RESPIMAT) 2.5 MCG/ACT AERS, Inhale 2 puffs into the lungs daily., Disp: 2 Inhaler, Rfl: 0    Review of Systems  Constitutional: Negative for activity change, appetite change, chills, diaphoresis, fatigue, fever and unexpected weight change.  HENT: Negative for congestion, rhinorrhea, sinus pressure, sneezing, sore throat  and trouble swallowing.   Eyes: Negative for photophobia and visual disturbance.  Respiratory: Positive for cough and shortness of breath. Negative for chest tightness, wheezing and stridor.   Cardiovascular: Negative for chest pain, palpitations and leg swelling.  Gastrointestinal: Negative for abdominal distention, abdominal pain, anal bleeding, blood in stool, constipation, diarrhea, nausea and vomiting.  Genitourinary: Negative for difficulty urinating, dysuria, flank pain and hematuria.  Musculoskeletal: Negative for arthralgias, back pain, gait problem, joint swelling and myalgias.  Skin: Negative for color change, pallor and wound.  Neurological: Negative for dizziness, tremors, weakness  and light-headedness.  Hematological: Negative for adenopathy. Does not bruise/bleed easily.  Psychiatric/Behavioral: Negative for agitation, behavioral problems, confusion, decreased concentration, dysphoric mood and sleep disturbance.       Objective:   Physical Exam  Constitutional: He is oriented to person, place, and time. He appears well-nourished. No distress.  HENT:  Head: Normocephalic and atraumatic.  Eyes: Pupils are equal, round, and reactive to light. Conjunctivae and EOM are normal. No scleral icterus.  Neck: Normal range of motion. Neck supple.  Cardiovascular: Normal rate, regular rhythm and normal heart sounds. Exam reveals no gallop and no friction rub.  No murmur heard. Pulmonary/Chest: No respiratory distress. He has decreased breath sounds in the right lower field.  Prolonged expiratory phase  Abdominal: He exhibits no distension. There is no abdominal tenderness. There is no rebound.  Musculoskeletal:        General: No tenderness or edema.  Lymphadenopathy:    He has no cervical adenopathy.  Neurological: He is alert and oriented to person, place, and time. He exhibits normal muscle tone. Coordination normal.  Skin: Skin is warm and dry. Rash noted. He is not diaphoretic.  No erythema. No pallor.     Psychiatric: He has a normal mood and affect. His behavior is normal. Judgment and thought content normal.    Hyperpigmented area on neck present today and also photographed previously and picture 04/08/2017:         Assessment & Plan:   Pneumothoraces in the context of severe lung disease: Grateful to cardiothoracic surgery for helping manage this  Aspergilloma: continue lifelong antifungal therapy: currently on CRESEMBA. Greatly appreciate Cassie's help here  Allergic bronchopulmonary aspgergillosis: As above, I would be more comfortable with thim being on long standing antifungal therapy. He is still on steroids as well  Sun exposed rash from voriconozole we have changed azoles not sure if this is made much of a difference  Mycobacterium avium pulmonary infection: We have been reluctant to stop these medications due to prior flares when he comes off the medication indications.   I wanted to check labs today but the [facility] server was down.  I have asked the patient asked Dr. Chase Farmer to check a CBC with differential and comprehensive metabolic panel on him tomorrow.

## 2018-12-17 ENCOUNTER — Telehealth: Payer: Self-pay | Admitting: Internal Medicine

## 2018-12-17 ENCOUNTER — Encounter: Payer: Self-pay | Admitting: Internal Medicine

## 2018-12-17 ENCOUNTER — Ambulatory Visit: Payer: BLUE CROSS/BLUE SHIELD | Admitting: Internal Medicine

## 2018-12-17 VITALS — BP 126/70 | HR 93 | Ht 68.0 in | Wt 111.6 lb

## 2018-12-17 DIAGNOSIS — B4481 Allergic bronchopulmonary aspergillosis: Secondary | ICD-10-CM | POA: Diagnosis not present

## 2018-12-17 DIAGNOSIS — R768 Other specified abnormal immunological findings in serum: Secondary | ICD-10-CM

## 2018-12-17 DIAGNOSIS — J4550 Severe persistent asthma, uncomplicated: Secondary | ICD-10-CM | POA: Diagnosis not present

## 2018-12-17 DIAGNOSIS — A310 Pulmonary mycobacterial infection: Secondary | ICD-10-CM | POA: Diagnosis not present

## 2018-12-17 DIAGNOSIS — B440 Invasive pulmonary aspergillosis: Secondary | ICD-10-CM

## 2018-12-17 DIAGNOSIS — Z79899 Other long term (current) drug therapy: Secondary | ICD-10-CM

## 2018-12-17 LAB — BASIC METABOLIC PANEL
BUN: 9 mg/dL (ref 6–23)
CALCIUM: 9.4 mg/dL (ref 8.4–10.5)
CO2: 30 mEq/L (ref 19–32)
Chloride: 101 mEq/L (ref 96–112)
Creatinine, Ser: 0.75 mg/dL (ref 0.40–1.50)
GFR: 112.85 mL/min (ref >60.00)
GLUCOSE: 82 mg/dL (ref 70–99)
Potassium: 4.1 mEq/L (ref 3.5–5.1)
Sodium: 138 mEq/L (ref 135–145)

## 2018-12-17 LAB — MAGNESIUM: Magnesium: 2 mg/dL (ref 1.5–2.5)

## 2018-12-17 LAB — HEPATIC FUNCTION PANEL
ALT: 33 U/L (ref 0–53)
AST: 33 U/L (ref 0–37)
Albumin: 4.1 g/dL (ref 3.5–5.2)
Alkaline Phosphatase: 173 U/L — ABNORMAL HIGH (ref 39–117)
Bilirubin, Direct: 0.1 mg/dL (ref 0.0–0.3)
Total Bilirubin: 0.5 mg/dL (ref 0.2–1.2)
Total Protein: 7.1 g/dL (ref 6.0–8.3)

## 2018-12-17 LAB — CBC WITH DIFFERENTIAL/PLATELET
Basophils Absolute: 0 10*3/uL (ref 0.0–0.1)
Basophils Relative: 0.4 % (ref 0.0–3.0)
Eosinophils Absolute: 0.3 10*3/uL (ref 0.0–0.7)
Eosinophils Relative: 2.9 % (ref 0.0–5.0)
HCT: 47 % (ref 39.0–52.0)
Hemoglobin: 15.8 g/dL (ref 13.0–17.0)
Lymphocytes Relative: 8.9 % — ABNORMAL LOW (ref 12.0–46.0)
Lymphs Abs: 0.8 10*3/uL (ref 0.7–4.0)
MCHC: 33.6 g/dL (ref 30.0–36.0)
MCV: 94.6 fl (ref 78.0–100.0)
MONOS PCT: 3.4 % (ref 3.0–12.0)
Monocytes Absolute: 0.3 10*3/uL (ref 0.1–1.0)
Neutro Abs: 7.9 10*3/uL — ABNORMAL HIGH (ref 1.4–7.7)
Neutrophils Relative %: 84.4 % — ABNORMAL HIGH (ref 43.0–77.0)
Platelets: 305 10*3/uL (ref 150.0–400.0)
RBC: 4.97 Mil/uL (ref 4.22–5.81)
RDW: 13.7 % (ref 11.5–15.5)
WBC: 9.4 10*3/uL (ref 4.0–10.5)

## 2018-12-17 LAB — PHOSPHORUS: Phosphorus: 3.1 mg/dL (ref 2.3–4.6)

## 2018-12-17 NOTE — Telephone Encounter (Signed)
Spoke with patient and advised him of the response from [name]. Patient stated he submitted disability application in November. Would take 90 days to find out if he has been approved or not. Patient concerned if started working part time he may not get approval. Advised patient there is only so much information we could provide, suggested he wait until he has approval/denial before he makes a decision regarding going back to work. Patient voiced understanding. Nothing further needed at this time.

## 2018-12-17 NOTE — Progress Notes (Signed)
male never smoker followed for ABPA , MAC and Aspergilloma .  Patient has a history of aspergilloma , status post VATS in 2012. 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 Followed at [facility] for MAC .    TEST  1.7/13 Ig E 800s 12/10/11 Sputum AFB  Smear and culture negative (prelim) 12/10/11 Fungal sputum culture - negative (final) 01/30/2012 spirometry - fev1 1.7L/41%, ratip 52 - BEST EVER 01/30/2012 walking desaturation test: 185 feet x 3 laps: did not deaturate 05/2014 >Spiro fev1 1.7L/40%, ratio 55  Spirometry 06/25/2017 showed FEV1 at 28%, ratio 46, FVC 50%.  Events  2010 Dx with MAI >Azithro/Ethambutol 2012 Dx with Aspergilloma >VATS >started on Voriconazaol  2012 Dx w/ ABPA w/ High IgE >4000>started on prednisone  2013 tried off azithr/Etham but developed fever, Cx neg but restarted on rx.  2016 Hospitalzed with hemoptysis >IR guided embolization . FOB/BAL + Saccharomyces on fungal fx . Serum Galactomannan was neg.  [name] evaluation 04/2017 for Transplant -rejected. Felt to early for transplant - recommended pre-transplant weight goal 128 lbs for a BMI > 19          HPI   OV 01/16/2016  Chief Complaint  Patient presents with  . Follow-up    Pt denies any increased dyspnea at this time - states that it comes and goes with activity. Pt states it is different on a daily basis. Denies hemoptysis since last OV.   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 Since then he overall feels that his dyspnea is not back to baseline. He feels he has had a new lower baseline. He has more fatigued than usual. But he is stable without any exacerbation. He feels in the next few years ago started to become permanently disabled. He currently works as a cBiomedical scientist There are no fevers.   Last chest x-ray 11/07/2015: Shows hyperinflation personally  visualized Last IgE January 2013 was 841 and significantly improved from 4000 2012.  Last CBC January 2017 with normal used to flows are 100 cells per cubic millimeter.   OV 08/27/2016  Chief Complaint  Patient presents with  . Follow-up    Pt states his cough and SOB is at baseline. Pt states he has hemoptysis around once a week at most - penny size of mucus. Pt denies CP/tightness and f/c/s.      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 He status post IR guided embolization of hemoptysis end 2016.   Last visit feb 2017 he was reporting worsening dyspnea. We checked his IgE. Improved significantly but it was still in the 700s. We started Xolair therapy. He's been doing this now for a few months. He says this has helped his dyspnea symptoms significantly. However at end of 2 weeks he starts getting more symptomatic. Overall he is stable. 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 There are no fevers. Here, function test today FEV1 is 1.65 L/42% postbronchodilator. This is similar to 2014 but improved compared to 2015. Off note he tells me that he is mild hemoptysis once a month this is stable. He says this is actually been going on since November 2016 when he had his embolization. This no change in this. He knows that he has to monitor his hemoptysis volume.  He will have his flu shot today  Last chest x-ray 11/07/2015: Shows hyperinflation personally visualized Last IgE FEb 2017 was 717 and started xolair (January 2013 was 841 and significantly improved from 4000 in  2012)   OV 03/18/2017  Chief Complaint  Patient presents with  . Follow-up    6 mo f/u. Breathing has been the same since the last visit. Denies any increased SOB or chest pains.    Follow-up severe persistent asthma with ABPA and aspergilloma with MAI infection. He status post IR guided embolization for hemoptysis and end of  2016   Overall he has done well. However he says that at his job at [employer] they have been working hard and he is mostly fatigue with very closely. Recently been only sleeping 3 hours per day because of the work. Then in mid April 2018 he have one episode of hemoptysis while at work. With small amount that lasted few to several minutes. It did scare him but he decided to hold off against going to the emergency department. Since then he's not had any recurrence. He feels stressed and job stress is contributing to this. Most recent CT scan chest as in December 2017 that shows stability documented below. There are no other new issues. He is open to having a transplant referral.   OV 01/27/2018  Chief Complaint  Patient presents with  . Follow-up    Pt states he has been doing good. Denies any complaints or concerns.     Follow-up severe persistent asthma with ABPA on prednisone 25m daily, spiriva, breo and Aspergilloma with MAI infection. He status post IR guided embolization of hemoptysis end 2016 and on Xolair since early 2017   Overall doing well. 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 Symptoms score is low ; 12 CAT score. Compliant with Rx. No hemoptysis. Reviewed prior labs - he says on daily prednisone since 2012. And prior to that has had significant eosinophilia and also break through eos since then (see below). Unable to come off prednisone completely. XKathreen Devoidhas helped but not fully. 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 , MRN: [id number], ADDRESS: [address] [place] [address]  12/17/2018 -   Chief Complaint  Patient presents with  . Follow-up    Pt states he was in the hosp Oct and Nov 2019. Pt states he is still not back to his self but is taking  one day at a time. Due to the recent hospitalization is why pt is here for today's appt.     Follow-up severe persistent asthma with ABPA on prednisone 518mdaily, spiriva, breo and Aspergilloma with MAI infection. He status post IR guided embolization of hemoptysis end 2016 and on Xolair since early 2017 bu tstopped since spring 2019 due to insurance issues    HPI KeELIAKIM [id number]. -returns for follow-up.  I last saw him in the spring 2019.  After that in September 2019 he saw my nurse practitioner.  Then mid October 2019 he was admitted with acute right-sided tension pneumothorax [personally visualized the CT chest and confirmed the findings today].  After that he had a 5-week hospital stay.  This included failed chest tube management and pleurodesis.  Finally he required  endobronchial valves by Dr. Roxan Hockey.  These were also finally removed a few weeks ago.  He saw Dr. Roxan Hockey yesterday.  Chest x-ray follow-up yesterday that I visualized the lungs look expanded.  He is also seen [name] of infectious diseases yesterday and is been asked to continue his MAC and ABPA antifungal therapy.  I did notice that he is not taking Xolair anymore since the spring 2019.  He says is due to insurance issues.  Today he tells me that overall with all these admissions he has been left completely fatigued, lost weight and worn out.  He is now worried about his life expectancy.  At the same time he wants to go on work.  He wants to know if he should apply for disability.  We have had these conversations in the past.  We discussed his life philosophy.  He tells me that if he did not work he would feel that he is lazy and it is not compatible with this personal value system.  I did indicate to him that this is to be completely respected but his medical condition is that he would qualify for disability.  Therefore it is a personal choice he will have to make.  He is applied for Social Security disability through  the hospital social worker in November 2019.  The decision is pending any time now.  He is frustrated that is not able to work 80 hours a day.  He is wondering if he can even work 40 hours a day.  I have only advised him that he should look at complete disability.  If at all he wants to work he can look at a desk job.  Definitely should avoid jobs that heavy workload in the kitchen that involve carrying things or even sneezing or even doing pulmonary function test anymore because given his propensity for pneumothorax any rise in intrathoracic pressure would put him at risk for pneumothorax or other complications.  Other than this in terms of his lung health he is feeling stable at this point.  He is not taking Xolair he stopped this in the spring 2019.  This means is continued IgE destruction of the lungs.  We discussed this and he is willing to look into restarting this  Of note, he also wants me to check his blood work for [name].  I reviewed Dr. Arlyss Queen note.  This is indeed correct because yesterday [facility] medical record system was down.     CAT Symptom & Quality of Life Score (GSK trademark) 0 is no burden. 5 is highest burden 01/27/2018   Never Cough -> Cough all the time 3  No phlegm in chest -> Chest is full of phlegm 2  No chest tightness -> Chest feels very tight 1  No dyspnea for 1 flight stairs/hill -> Very dyspneic for 1 flight of stairs 3  No limitations for ADL at home -> Very limited with ADL at home 1  Confident leaving home -> Not at all confident leaving home 0  Sleep soundly -> Do not sleep soundly because of lung condition 1  Lots of Energy -> No energy at all 1  TOTAL Score (max 40)  12    Results for ASHLAND, WISEMAN (MRN [id number]) as of 01/27/2018 09:12  Ref. Range 08/06/2016 09:09 06/25/2017 09:11  FEV1-Pre Latest Units: L 1.50 1.16  FEV1-%Pred-Pre Latest Units: % 39 28  Pre FEV1/FVC ratio Latest Units: % 53 46     Results for  ZOHAR, MARONEY (MRN [id number]) as of  01/27/2018 09:12  Ref. Range 12/16/2010 18:25 12/17/2010 03:45 12/17/2010 12:38 12/18/2010 04:25 12/19/2010 04:00 12/20/2010 04:04 12/21/2010 03:37 12/22/2010 06:42 12/22/2010 15:22 12/23/2010 04:22 12/24/2010 03:30 12/25/2010 04:19 12/27/2010 04:00 03/05/2011 18:15 03/06/2011 09:08 03/07/2011 09:18 03/09/2011 05:10 03/11/2011 04:54 04/02/2011 09:41 05/28/2011 09:42 11/26/2011 17:14 12/10/2011 10:21 12/10/2011 10:21 03/13/2012 16:54 09/22/2012 10:46 11/23/2013 09:46 07/05/2014 09:58 02/16/2015 10:16 08/08/2015 10:04 10/13/2015 04:00 10/13/2015 04:20 10/14/2015 02:28 10/15/2015 04:44 12/05/2015 12:16 01/16/2016 16:16 04/09/2016 09:43 08/09/2017 10:48 08/10/2017 10:46  Eosinophils Absolute Latest Ref Range: 0.0 - 0.7 K/uL 0.3 0.1  0.4          0.0 0.0 0.1   0.1 0.0 0.0    0.1 0.1 0.1 0.1 0.0 0.1   0.1 0.1 0.0 196 0.0 0.0   Results for IZAYA, NETHERTON (MRN [id number]) as of 01/27/2018 09:12  Ref. Range 03/07/2011 09:18 03/08/2011 16:23 11/26/2011 17:14 01/16/2016 16:16  IgE (Immunoglobulin E), Serum Latest Ref Range: <115 kU/L 4185.2 Result con... (H) 4744.6.Marland Kitchen. (H) 841.8 (H) 717 (H)     ROS - per HPI     has a past medical history of Anemia, 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[place]), Drug rash (12/16/2017), Dyspnea, Esophageal reflux, Hypothyroidism, Lung disease, bullous ([place]), MAI (mycobacterium avium-intracellulare) ([place]), Mold exposure (12/05/2015), Photosensitivity dermatitis (09/16/2017), Solar lentigo (08/08/2015), and Weight loss (10/15/2016).   reports that he quit smoking about 11 years ago. His smoking use included cigarettes. He has a 13.30 pack-year smoking history. He quit smokeless tobacco use about 24 years ago.  His smokeless tobacco use included snuff.  Past Surgical History:  Procedure Laterality Date  . CHEST TUBE INSERTION Right 09/29/2018   Procedure: CHEST TUBE REPLACEMENT;  Surgeon: Melrose Nakayama, MD;  Location: [facility];  Service: Thoracic;  Laterality: Right;  . INSERTION OF IBV VALVE N/A 09/19/2018   Procedure: INSERTION OF INTERBRONCHIAL VALVE  (IBV);  Surgeon: Melrose Nakayama, MD;  Location: [facility] OR;  Service: Thoracic;  Laterality: N/A;  . INSERTION OF IBV VALVE N/A 12/01/2018   Procedure: REMOVAL OF INTERBRONCHIAL VALVE (IBV);  Surgeon: Melrose Nakayama, MD;  Location[facility] OR;  Service: Thoracic;  Laterality: N/A;  . left VATS  2012   thoracotomy and LUL apical posterior segmentectomy  . STAPLING OF BLEBS Right 09/12/2018   Procedure: STAPLING OF BLEBS, right lower and middle lung lobes;  Surgeon: Melrose Nakayama, MD;  Location: [facility];  Service: Thoracic;  Laterality: Right;  Marland Kitchen VIDEO ASSISTED THORACOSCOPY Right 09/12/2018   Procedure: VIDEO ASSISTED THORACOSCOPY, right lung;  Surgeon: Melrose Nakayama, MD;  Location: [facility];  Service: Thoracic;  Laterality: Right;  Marland Kitchen VIDEO BRONCHOSCOPY Bilateral 10/20/2015   Procedure: VIDEO BRONCHOSCOPY WITHOUT FLUORO;  Surgeon: Marshell Garfinkel, MD;  Location: [facility];  Service: Cardiopulmonary;  Laterality: Bilateral;  . VIDEO BRONCHOSCOPY N/A 09/19/2018   Procedure: VIDEO BRONCHOSCOPY;  Surgeon: Melrose Nakayama, MD;  Location: [facility] OR;  Service: Thoracic;  Laterality: N/A;  . VIDEO BRONCHOSCOPY N/A 12/01/2018   Procedure: VIDEO BRONCHOSCOPY;  Surgeon: Melrose Nakayama, MD;  Location: [facility];  Service: Thoracic;  Laterality: N/A;  . VIDEO BRONCHOSCOPY WITH INSERTION OF INTERBRONCHIAL VALVE (IBV) N/A 09/29/2018   Procedure: VIDEO BRONCHOSCOPY WITH INSERTION OF INTERBRONCHIAL VALVE  (IBV);  Surgeon: Melrose Nakayama, MD;  Location: [facility] OR;  Service: Thoracic;  Laterality: N/A;    Allergies  Allergen Reactions  . Clarithromycin Other (See Comments)    Adverse reaction w/ voriconazole UNSPECIFIED REACTION   . Rifaximin Other (  See Comments)    Adverse reaction w/ voriconazole UNSPECIFIED REACTION     Immunization History  Administered Date(s) Administered  . DTaP 11/19/2010  . Influenza Split 10/29/2011, 08/14/2012  . Influenza Whole 09/19/2010  .  Influenza,inj,Quad PF,6+ Mos 11/23/2013, 09/28/2014, 09/06/2015, 08/27/2016, 08/10/2017, 09/09/2018  . Pneumococcal Conjugate-13 04/29/2017, 04/29/2017  . Pneumococcal Polysaccharide-23 12/17/2010    Family History  Adopted: Yes     Current Outpatient Medications:  .  albuterol (PROVENTIL HFA;VENTOLIN HFA) 108 (90 Base) MCG/ACT inhaler, Inhale 2 puffs into the lungs every 6 (six) hours as needed. (Patient taking differently: Inhale 2 puffs into the lungs every 6 (six) hours as needed for wheezing or shortness of breath. ), Disp: 1 Inhaler, Rfl: 4 .  Calcium Carbonate-Vitamin D 600-400 MG-UNIT tablet, Take 1 tablet by mouth daily. , Disp: , Rfl:  .  ethambutol (MYAMBUTOL) 400 MG tablet, TAKE TWO AND ONE-HALF TABLETS BY MOUTH ONCE DAILY (Patient taking differently: Take 1,000 mg by mouth daily. ), Disp: 225 tablet, Rfl: 5 .  feeding supplement, ENSURE ENLIVE, (ENSURE ENLIVE) LIQD, Take 237 mLs by mouth 3 (three) times daily between meals. (Patient taking differently: Take 237 mLs by mouth 2 (two) times daily between meals. ), Disp: 237 mL, Rfl: 12 .  ferrous sulfate (FERRO-BOB) 325 (65 FE) MG tablet, Take 325 mg by mouth daily.  , Disp: , Rfl:  .  fluticasone furoate-vilanterol (BREO ELLIPTA) 100-25 MCG/INH AEPB, Inhale 1 puff into the lungs daily., Disp: 1 each, Rfl: 5 .  guaiFENesin (MUCINEX) 600 MG 12 hr tablet, Take 1 tablet (600 mg total) by mouth 2 (two) times daily., Disp: 30 tablet, Rfl: 0 .  Isavuconazonium Sulfate 186 MG CAPS, Take 2 capsules (372 mg total) by mouth daily., Disp: 60 capsule, Rfl: 2 .  levothyroxine (SYNTHROID, LEVOTHROID) 75 MCG tablet, Take 75 mcg by mouth daily before breakfast. , Disp: , Rfl: 0 .  predniSONE (DELTASONE) 5 MG tablet, TAKE 1 TABLET BY MOUTH ONCE DAILY WITH BREAKFAST (Patient taking differently: Take 5 mg by mouth daily with breakfast. ), Disp: 90 tablet, Rfl: 1 .  Tiotropium Bromide Monohydrate (SPIRIVA RESPIMAT) 2.5 MCG/ACT AERS, Inhale 2 puffs into  the lungs daily., Disp: 1 Inhaler, Rfl: 3 .  EPINEPHrine 0.3 mg/0.3 mL IJ SOAJ injection, Inject 0.3 mLs (0.3 mg total) into the muscle once. (Patient not taking: Reported on 12/17/2018), Disp: 1 Device, Rfl: 11      Objective:   Vitals:   12/17/18 1200  BP: 126/70  Pulse: 93  SpO2: 92%  Weight: 111 lb 9.6 oz (50.6 kg)  Height: _0  (1.727 m)    Estimated body mass index is 16.97 kg/m as calculated from the following:   Height as of this encounter: _1  (1.727 m).   Weight as of this encounter: 111 lb 9.6 oz (50.6 kg).  _2 @  [employer]   12/17/18 1200  Weight: 111 lb 9.6 oz (50.6 kg)     Physical Exam  General Appearance:    Alert, cooperative, no distress, appears stated age - older , Deconditioned looking - yes,  , OBESE  - has lost weight. THING, Sitting on Wheelchair -  no  Head:    Normocephalic, without obvious abnormality, atraumatic  Eyes:    PERRL, conjunctiva/corneas clear,  Ears:    Normal TM's and external ear canals, both ears  Nose:   Nares normal, septum midline, mucosa normal, no drainage    or sinus tenderness. OXYGEN ON  - no .  Patient is @ ra   Throat:   Lips, mucosa, and tongue normal; teeth and gums normal. Cyanosis on lips - no  Neck:   Supple, symmetrical, trachea midline, no adenopathy;    thyroid:  no enlargement/tenderness/nodules; no carotid   bruit or JVD  Back:     Symmetric, no curvature, ROM normal, no CVA tenderness  Lungs:     Distress - n , Wheeze ono, Barrell Chest - no, Purse lip breathing - no, Crackles - no   Chest Wall:    No tenderness or deformity.    Heart:    Regular rate and rhythm, S1 and S2 normal, no rub   or gallop, Murmur - no  Breast Exam:    NOT DONE  Abdomen:     Soft, non-tender, bowel sounds active all four quadrants,    no masses, no organomegaly. Visceral obesity - no  Genitalia:   NOT DONE  Rectal:   NOT DONE  Extremities:   Extremities - normal, Has Cane - no, Clubbing - yes, mild, Edema - no    Pulses:   2+ and symmetric all extremities  Skin:   Stigmata of Connective Tissue Disease - no but has other chronic rash  Lymph nodes:   Cervical, supraclavicular, and axillary nodes normal  Psychiatric:  Neurologic:   Pleasant - yes, Anxious - no, Flat affect - no  CAm-ICU - neg, Alert and Oriented x 3 - yes, Moves all 4s - yes, Speech - normal, Cognition - intact           Assessment:       ICD-10-CM   1. Severe persistent asthma without complication X44.81 IgE    CBC w/Diff    Basic Metabolic Panel (BMET)    Hepatic function panel    Magnesium    Phosphorus    Phosphorus    Magnesium    Hepatic function panel    Basic Metabolic Panel (BMET)    CBC w/Diff    IgE  2. 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 Elevated IgE level R76.8   6. Encounter for drug therapy Z79.899        Plan:     Patient Instructions     ICD-10-CM   1. Severe persistent asthma without complication E56.31   2. 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 Elevated IgE level R76.8   6. Encounter for drug therapy Z79.899    Do blood IgE, cbc with diff, bmet, lft mag and phos  - will send results to Dr Tommy Medal Based on IgE repeat will need to get you back on Xolair Continue inhalers and meds Support being disabled Avoid pft tests, sneezing too much, coughing a lot - environments like this  - increases pneumothorax risk  Followup 2-3 months or sooner if needed   > 50% of this > 25 min visit spent in face to face counseling or coordination of care - by this undersigned MD - Dr Brand Males. This includes one or more of the following documented above: discussion of test results, diagnostic or treatment recommendations,  prognosis, risks and benefits of management options, instructions, education, compliance or risk-factor reduction   SIGNATURE    Dr. Brand Males, M.D., F.C.C.P,  Pulmonary and Critical Care Medicine Staff Physician, [facility] Director - Interstitial Lung Disease  Program  Pulmonary [place] at [place], [place], [address]  Pager: [phone], If no answer or between  15:00h - 7:00h: call [phone] Telephone: [phone]  1:48 PM 12/17/2018

## 2018-12-17 NOTE — Patient Instructions (Addendum)
ICD-10-CM   1. Severe persistent asthma without complication V44.51   2. 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 Elevated IgE level R76.8   6. Encounter for drug therapy Z79.899    Do blood IgE, cbc with diff, bmet, lft mag and phos  - will send results to Dr Tommy Medal Based on IgE repeat will need to get you back on Xolair Continue inhalers and meds Support being disabled Avoid pft tests, sneezing too much, coughing a lot - environments like this  - increases pneumothorax risk  Followup 2-3 months or sooner if needed

## 2018-12-17 NOTE — Telephone Encounter (Signed)
Call made to patient, patient states he forgot to ask whether he recommends him trying to get a answer from disability before returning to work. He has been out of work since October. Surgeon cleared him to go back to work but he wants MR recommendations because patient states during the office visit it did not sound like MR wanted him to return to work.   MR please advise if patient should return to work. Thanks.

## 2018-12-17 NOTE — Telephone Encounter (Signed)
Patient was just seen and now has some questions about his last visit related to his disability.

## 2018-12-17 NOTE — Telephone Encounter (Signed)
If he works and [place] people find out they might cancel disability . He has bad lung diseas and I have advised him diability but It depends on his $ goals and personal value system. IF he can wait a week or two to find out from disability people it would be ideal

## 2018-12-18 LAB — IGE: IgE (Immunoglobulin E), Serum: 537 kU/L — ABNORMAL HIGH (ref <=114)

## 2018-12-23 DIAGNOSIS — B4481 Allergic bronchopulmonary aspergillosis: Secondary | ICD-10-CM | POA: Diagnosis not present

## 2018-12-23 DIAGNOSIS — E033 Postinfectious hypothyroidism: Secondary | ICD-10-CM | POA: Diagnosis not present

## 2018-12-23 DIAGNOSIS — E43 Unspecified severe protein-calorie malnutrition: Secondary | ICD-10-CM | POA: Diagnosis not present

## 2018-12-23 DIAGNOSIS — R634 Abnormal weight loss: Secondary | ICD-10-CM | POA: Diagnosis not present

## 2018-12-23 DIAGNOSIS — Z0000 Encounter for general adult medical examination without abnormal findings: Secondary | ICD-10-CM | POA: Diagnosis not present

## 2018-12-23 DIAGNOSIS — M25512 Pain in left shoulder: Secondary | ICD-10-CM | POA: Diagnosis not present

## 2018-12-26 ENCOUNTER — Telehealth: Payer: Self-pay | Admitting: Internal Medicine

## 2018-12-26 NOTE — Telephone Encounter (Signed)
Ige high in 500s Eos also 300  I like for him to resart Xolair or I can consider another new biologic for asthma - dupixent. Please ask him if he is interested in either option  Thanks  MR

## 2018-12-29 NOTE — Telephone Encounter (Signed)
Pt is returning call. Cb is [phone].

## 2018-12-29 NOTE — Telephone Encounter (Signed)
Called and spoke with pt stating to him that the dupixent is every 2 weeks just like xolair and if he did not want to do an injection that was every 2 weeks, another option is fasenra which is every 8 weeks.  Pt wants to know if the fasenra requires more medication since it it is every 8 weeks vs the others which is every 2 weeks.   Pt would like a call to further explain the fasenra vs the dupixent so he can decide if he wants to do either one of those biologics or if he wants to go back to taking xolair again since he knows he needs to be on a biologic med.  Routing this to both [employer] and [employer].

## 2018-12-29 NOTE — Telephone Encounter (Signed)
Dupixent is every 2 weeks. If he wants we can do fasenra which will be every 8 weeks. No one knows if xolair v dupixent v fasenra which one is superior to other because not compared. Just that I though he got his lungs collapsed after he stopped xolair and so I think he should be on some biologic  Only time will tell how he handles and feels with these biologics

## 2018-12-29 NOTE — Telephone Encounter (Signed)
Attempted to call pt but unable to reach. Left message for pt to return call. 

## 2018-12-29 NOTE — Telephone Encounter (Signed)
Called and spoke with Patient.  He stated that he would be ok starting Xolair back, but wanted to know if Dupixent would be every 2 weeks or if it would monthly?  He stated that is  what he didn't like about Xolair, it was every 2 weeks. He wanted to know if Dr Chase Caller thought [place] was better then Xolair.  Dr Chase Caller, please advise

## 2018-12-31 NOTE — Telephone Encounter (Signed)
Spoke with patient-states he would like to proceed with [facility] paperwork as it would be more convenient for him coming back and forth so often for injections. Pt aware that he will need to come by the office to sign forms. Pt will come by Thursday 01/01/2019 and ask for [employer].   Hold message open to document once patient has come by.

## 2019-01-07 NOTE — Telephone Encounter (Signed)
Pt came by and signed forms; Lynelle Smoke Nicki Reaper has this information and will fax to company.

## 2019-01-08 DIAGNOSIS — J4550 Severe persistent asthma, uncomplicated: Secondary | ICD-10-CM | POA: Diagnosis not present

## 2019-01-13 DIAGNOSIS — E43 Unspecified severe protein-calorie malnutrition: Secondary | ICD-10-CM | POA: Diagnosis not present

## 2019-01-13 DIAGNOSIS — Z09 Encounter for follow-up examination after completed treatment for conditions other than malignant neoplasm: Secondary | ICD-10-CM | POA: Diagnosis not present

## 2019-01-13 DIAGNOSIS — E559 Vitamin D deficiency, unspecified: Secondary | ICD-10-CM | POA: Diagnosis not present

## 2019-01-13 DIAGNOSIS — R634 Abnormal weight loss: Secondary | ICD-10-CM | POA: Diagnosis not present

## 2019-01-16 ENCOUNTER — Other Ambulatory Visit: Payer: Self-pay | Admitting: Infectious Disease

## 2019-01-16 DIAGNOSIS — B449 Aspergillosis, unspecified: Secondary | ICD-10-CM

## 2019-01-16 DIAGNOSIS — A310 Pulmonary mycobacterial infection: Secondary | ICD-10-CM

## 2019-01-17 DIAGNOSIS — J4550 Severe persistent asthma, uncomplicated: Secondary | ICD-10-CM | POA: Diagnosis not present

## 2019-01-22 ENCOUNTER — Telehealth: Payer: Self-pay | Admitting: Internal Medicine

## 2019-01-22 DIAGNOSIS — M810 Age-related osteoporosis without current pathological fracture: Secondary | ICD-10-CM | POA: Diagnosis not present

## 2019-01-22 NOTE — Telephone Encounter (Signed)
I called [employer] but there was no answer. LM to call back.

## 2019-01-22 NOTE — Telephone Encounter (Signed)
PA forms were never filled out or faxed to the pt's insurance. Forms have been filled out and will be faxed to [phone].

## 2019-01-23 DIAGNOSIS — M818 Other osteoporosis without current pathological fracture: Secondary | ICD-10-CM | POA: Diagnosis not present

## 2019-01-23 DIAGNOSIS — Z7952 Long term (current) use of systemic steroids: Secondary | ICD-10-CM | POA: Diagnosis not present

## 2019-01-23 NOTE — Telephone Encounter (Signed)
Called and spoke with [place], [place], [phone].  Donald Prose stated that she was calling about Nucala.  She stated PA form received left a section blank.  Donald Prose stated diagnosis code used was J21.82 for Nucala. Donald Prose needs to know how Patient responds to treatment and if there is a increase use in inhalers, per policy.   Message routed to [place]

## 2019-01-28 NOTE — Telephone Encounter (Signed)
Called and spoke with [name] who stated that she was able to get information taken care of for pt's xolair injection. Per [name], she wanted to state as a reminder that the left side of the form is for the provider and the right side of the form is for the pharmacy or if the medication is to be done at an outpatient pharmacy. [name] also stated if we had any problems or if she had any problems in regards to pt and the information for [place] to get in contact with her if we needed to or she would call us back if she needed to. Nothing further needed.

## 2019-01-28 NOTE — Telephone Encounter (Signed)
Spoke with [place]. She needed to clarify what medication we were requesting for the pt. Advised her that we are requesting Fasenra and NOT Nucala. Donald Prose states that she will continue with processing the pt's PA for [name] and get back with [place] if she has any further questions.

## 2019-01-28 NOTE — Telephone Encounter (Signed)
Donald Prose is calling back with questions for Ria Comment UD#[phone]

## 2019-01-28 NOTE — Telephone Encounter (Signed)
Duplicate message. Please see telephone encounter from 01/22/2019.

## 2019-02-06 DIAGNOSIS — J4550 Severe persistent asthma, uncomplicated: Secondary | ICD-10-CM | POA: Diagnosis not present

## 2019-02-10 ENCOUNTER — Telehealth: Payer: Self-pay | Admitting: Internal Medicine

## 2019-02-10 NOTE — Telephone Encounter (Signed)
Message routed to Injection pool

## 2019-02-15 DIAGNOSIS — J4550 Severe persistent asthma, uncomplicated: Secondary | ICD-10-CM | POA: Diagnosis not present

## 2019-02-17 NOTE — Telephone Encounter (Signed)
Received a fax from [employer]. In order to process the pt's PA, more clinical notes were needed. These have been faxed to the pt's insurance at [phone]. Will await PA decision.

## 2019-02-20 ENCOUNTER — Ambulatory Visit: Payer: Self-pay | Admitting: Internal Medicine

## 2019-02-20 NOTE — Telephone Encounter (Signed)
Called [employer].  [name] has been approved for 03/06/2019 - 03/05/2020. Will route to [employer] to order pt's medication.

## 2019-02-24 NOTE — Telephone Encounter (Signed)
[name] can't be ordered til after 03/06/2019. (effective date) Waiting.

## 2019-03-02 NOTE — Telephone Encounter (Signed)
[name] from [facility] called to check the status of the pts Fasenra Medication. She can be reached at  [phone]

## 2019-03-04 ENCOUNTER — Other Ambulatory Visit: Payer: Self-pay | Admitting: Adult Health

## 2019-03-04 ENCOUNTER — Telehealth: Payer: Self-pay | Admitting: Internal Medicine

## 2019-03-04 MED ORDER — FLUTICASONE FUROATE-VILANTEROL 100-25 MCG/INH IN AEPB: 1.0000 | INHALATION_SPRAY | Freq: Every day | RESPIRATORY_TRACT | 5 refills | Status: DC

## 2019-03-04 MED ORDER — TIOTROPIUM BROMIDE MONOHYDRATE 2.5 MCG/ACT IN AERS: 2.0000 | INHALATION_SPRAY | Freq: Every day | RESPIRATORY_TRACT | 5 refills | Status: DC

## 2019-03-04 NOTE — Telephone Encounter (Signed)
I spoke with [name] at [employer]. She said Nucala is the one we can't order til 03/06/2019 not Fasenra. I'll route this to [place] to make her aware. (Inj Pool)

## 2019-03-04 NOTE — Telephone Encounter (Signed)
Refill of both spiriva and breo sent to pt's preferred pharmacy. Called and spoke with pt letting him know this had been done. Pt expressed understanding. Nothing further needed.

## 2019-03-04 NOTE — Telephone Encounter (Signed)
As [name]'s previous messages states, [name] can't be ordered until 03/06/2019.

## 2019-03-04 NOTE — Telephone Encounter (Signed)
Message will be routed back to [facility] as she is responsible for ordering medication.

## 2019-03-09 DIAGNOSIS — J4550 Severe persistent asthma, uncomplicated: Secondary | ICD-10-CM | POA: Diagnosis not present

## 2019-03-18 DIAGNOSIS — J4550 Severe persistent asthma, uncomplicated: Secondary | ICD-10-CM | POA: Diagnosis not present

## 2019-03-25 DIAGNOSIS — G8929 Other chronic pain: Secondary | ICD-10-CM | POA: Diagnosis not present

## 2019-03-25 DIAGNOSIS — Z09 Encounter for follow-up examination after completed treatment for conditions other than malignant neoplasm: Secondary | ICD-10-CM | POA: Diagnosis not present

## 2019-03-25 DIAGNOSIS — M25512 Pain in left shoulder: Secondary | ICD-10-CM | POA: Diagnosis not present

## 2019-03-25 DIAGNOSIS — R634 Abnormal weight loss: Secondary | ICD-10-CM | POA: Diagnosis not present

## 2019-03-25 DIAGNOSIS — M818 Other osteoporosis without current pathological fracture: Secondary | ICD-10-CM | POA: Diagnosis not present

## 2019-03-27 ENCOUNTER — Telehealth: Payer: Self-pay | Admitting: Internal Medicine

## 2019-03-30 ENCOUNTER — Telehealth: Payer: Self-pay | Admitting: Internal Medicine

## 2019-03-30 NOTE — Telephone Encounter (Signed)
Received a fax from [employer]. Pt's [name] PA still has not been completed. This process was started by another employee back in February 2020. With the fax from [employer], a PA form for [name] was included. This has been filled out and faxed to [facility]. Will await PA decision.

## 2019-03-30 NOTE — Telephone Encounter (Signed)
Called [facility]. We do not have the pt on Xolair. Advised them that the pt has been switched to a different medication.

## 2019-04-02 NOTE — Telephone Encounter (Signed)
Message routed to injection pool.

## 2019-04-02 NOTE — Telephone Encounter (Signed)
Jenness Corner is calling back she needs clarification about the [place], [place]

## 2019-04-02 NOTE — Telephone Encounter (Signed)
Horris Latino from [place] is returning phone call.  Horris Latino phone number is [phone].

## 2019-04-02 NOTE — Telephone Encounter (Signed)
LMTCB x1 for [place] with [employer]. Pt is NO LONGER on Xolair.

## 2019-04-02 NOTE — Telephone Encounter (Signed)
Spoke with Horris Latino with [employer]. Advised her that the pt is no longer on Xolair. Horris Latino was confused because the pt's last OV note stated to stay on Xolair. This change was made in February. Horris Latino is going to continue working on this [place] for [employer].

## 2019-04-02 NOTE — Telephone Encounter (Signed)
I have not received anything back on the pt's PA status. Called [employer] at [phone]. Spoke with Seth Bake. States that they do not have anything on file for this PA. I have refaxed the PA form and clinical information to [phone].

## 2019-04-03 NOTE — Telephone Encounter (Signed)
Received a fax from [employer]. [name] has been approved 04/02/2019 - 04/01/2020.

## 2019-04-04 ENCOUNTER — Other Ambulatory Visit: Payer: Self-pay | Admitting: Internal Medicine

## 2019-04-06 NOTE — Telephone Encounter (Signed)
Called [facility] to set up the pt's first shipment. I was advised that they need to speak to the pt to set up the first shipment. Once they speak to the pt, they will reach out to [place] to set up shipment.

## 2019-04-07 DIAGNOSIS — E033 Postinfectious hypothyroidism: Secondary | ICD-10-CM | POA: Diagnosis not present

## 2019-04-08 DIAGNOSIS — J4550 Severe persistent asthma, uncomplicated: Secondary | ICD-10-CM | POA: Diagnosis not present

## 2019-04-09 DIAGNOSIS — M25512 Pain in left shoulder: Secondary | ICD-10-CM | POA: Diagnosis not present

## 2019-04-09 DIAGNOSIS — G8929 Other chronic pain: Secondary | ICD-10-CM | POA: Diagnosis not present

## 2019-04-09 DIAGNOSIS — M75102 Unspecified rotator cuff tear or rupture of left shoulder, not specified as traumatic: Secondary | ICD-10-CM | POA: Diagnosis not present

## 2019-04-10 ENCOUNTER — Other Ambulatory Visit: Payer: Self-pay | Admitting: Internal Medicine

## 2019-04-10 ENCOUNTER — Telehealth: Payer: Self-pay | Admitting: Internal Medicine

## 2019-04-10 MED ORDER — PREDNISONE 5 MG PO TABS: ORAL_TABLET | ORAL | 0 refills | Status: DC

## 2019-04-10 NOTE — Telephone Encounter (Signed)
rx refilled  Pt states will call back in June to schedule July appt with MR  Nothing further needed

## 2019-04-15 DIAGNOSIS — J4550 Severe persistent asthma, uncomplicated: Secondary | ICD-10-CM | POA: Diagnosis not present

## 2019-04-15 NOTE — Telephone Encounter (Signed)
[name] Order: 93m #1 prefilled syringe Ordered date: 04/15/2019 Expected date of arrival: 04/16/2019 Ordered by: LDesmond Dike [facility]Pharmacy: [facility]

## 2019-04-15 NOTE — Telephone Encounter (Signed)
[name]  From [facility]  want's to set up a delivery date for [name]

## 2019-04-15 NOTE — Telephone Encounter (Signed)
Called [facility] to follow up on this matter. Spoke with [employer]. States that they have not been able to get in touch with the pt to get consent to ship.  LMTCB x1 for pt.

## 2019-04-16 NOTE — Telephone Encounter (Signed)
[name] Shipment Received:  33m #1 prefilled syringe Medication arrival date: 04/16/2019 Lot #: [id number] date: 05/2020 Received by: [facility]BS

## 2019-04-17 DIAGNOSIS — J4550 Severe persistent asthma, uncomplicated: Secondary | ICD-10-CM | POA: Diagnosis not present

## 2019-04-24 DIAGNOSIS — G8929 Other chronic pain: Secondary | ICD-10-CM | POA: Diagnosis not present

## 2019-04-24 DIAGNOSIS — R936 Abnormal findings on diagnostic imaging of limbs: Secondary | ICD-10-CM | POA: Diagnosis not present

## 2019-04-30 ENCOUNTER — Other Ambulatory Visit: Payer: Self-pay

## 2019-04-30 ENCOUNTER — Telehealth: Payer: Self-pay | Admitting: Internal Medicine

## 2019-04-30 ENCOUNTER — Telehealth: Payer: Self-pay | Admitting: *Deleted

## 2019-04-30 DIAGNOSIS — B449 Aspergillosis, unspecified: Secondary | ICD-10-CM

## 2019-04-30 DIAGNOSIS — A310 Pulmonary mycobacterial infection: Secondary | ICD-10-CM

## 2019-04-30 DIAGNOSIS — J4550 Severe persistent asthma, uncomplicated: Secondary | ICD-10-CM

## 2019-04-30 MED ORDER — EPINEPHRINE 0.3 MG/0.3ML IJ SOAJ: 0.3000 mg | Freq: Once | INTRAMUSCULAR | 11 refills | Status: DC

## 2019-04-30 NOTE — Telephone Encounter (Signed)
Send to [employer] per LL because TS has refrigerator in her office to check.  Thank you!

## 2019-04-30 NOTE — Telephone Encounter (Signed)
Rx sent 

## 2019-04-30 NOTE — Telephone Encounter (Signed)
Spoke with pt. He wanted to make his first appt: 05/05/2019.(His [name] is here.) I explained to him about the 2 hr wait and to bring his Epi-pen with him and to let me see it. Sending rx electronically now. Nothing further needed.

## 2019-05-01 MED ORDER — CRESEMBA 186 MG PO CAPS: 2.0000 | ORAL_CAPSULE | Freq: Every day | ORAL | 5 refills | Status: DC

## 2019-05-04 ENCOUNTER — Telehealth: Payer: Self-pay | Admitting: Pharmacist

## 2019-05-04 DIAGNOSIS — M7502 Adhesive capsulitis of left shoulder: Secondary | ICD-10-CM | POA: Diagnosis not present

## 2019-05-04 NOTE — Telephone Encounter (Signed)
Submitted prior authorization for Cresemba to [employer]. Should have a response within 3 days.

## 2019-05-05 ENCOUNTER — Ambulatory Visit (INDEPENDENT_AMBULATORY_CARE_PROVIDER_SITE_OTHER): Payer: BC Managed Care – PPO

## 2019-05-05 DIAGNOSIS — J4550 Severe persistent asthma, uncomplicated: Secondary | ICD-10-CM

## 2019-05-05 MED ORDER — BENRALIZUMAB 30 MG/ML ~~LOC~~ SOSY
30.0000 mg | PREFILLED_SYRINGE | Freq: Once | SUBCUTANEOUS | Status: AC
  Administered 2019-05-05: 30 mg via SUBCUTANEOUS

## 2019-05-05 NOTE — Telephone Encounter (Signed)
Patient called to inquire about PA for medication. Informed patient that PA was sent to insurance yesterday and we should here back early next week.  Brett Mcalpine, LPN

## 2019-05-05 NOTE — Progress Notes (Signed)
Have you been hospitalized within the last 10 days?  No  Do you have a fever?  No Do you have a cough?  No Do you have a headache or sore throat? No   Patient presented to the office today for first-time Fasenra injection.  Primary Pulmonologist: Brand Males MD Medication name: [name] Strength: 74m Site(s): Rt. arm  Epi pen/Auvi-Q visible during appointment: Yes  Time of injection: 2:35 pm  Patient evaluated every 15-20 minutes per protocol x2 hours.  1st check: 2:55 PM Evaluation:No itching, arm doesn't feel warm to pt's touch no sx.  2nd check: 3:15 PM Evaluation: No sx or itching, pt says arm feels fine.  3rd check: 3:35  PM Evaluation: Tiny spot on the site it was already starting to fade. It was more of a pink in color, no itching, or sx.  4th check: 3:50 PM Evaluation: No itching, no HA or S-Throat(still)  5th check: 4:10 PM Evaluation: No itching, no sx.  6th check: 4:38 PM Evaluation: Site was a little pink, no itching. Still no HA or S-Throat. Pt had no sx, so I let him go.

## 2019-05-06 NOTE — Telephone Encounter (Signed)
Patient's PA was approved through 10/30/2019.

## 2019-05-07 DIAGNOSIS — M7502 Adhesive capsulitis of left shoulder: Secondary | ICD-10-CM | POA: Diagnosis not present

## 2019-05-09 DIAGNOSIS — J4550 Severe persistent asthma, uncomplicated: Secondary | ICD-10-CM | POA: Diagnosis not present

## 2019-05-12 DIAGNOSIS — M7502 Adhesive capsulitis of left shoulder: Secondary | ICD-10-CM | POA: Diagnosis not present

## 2019-05-14 ENCOUNTER — Telehealth: Payer: Self-pay | Admitting: Internal Medicine

## 2019-05-15 DIAGNOSIS — J4550 Severe persistent asthma, uncomplicated: Secondary | ICD-10-CM | POA: Diagnosis not present

## 2019-05-15 DIAGNOSIS — M7502 Adhesive capsulitis of left shoulder: Secondary | ICD-10-CM | POA: Diagnosis not present

## 2019-05-15 NOTE — Telephone Encounter (Signed)
[name] Order: 4m #1 prefilled syringe Ordered date: 05/15/2019 Expected date of arrival: 05/19/2019 Ordered by: LDesmond Dike CBrooksPharmacy: [facility]

## 2019-05-18 DIAGNOSIS — J4550 Severe persistent asthma, uncomplicated: Secondary | ICD-10-CM | POA: Diagnosis not present

## 2019-05-18 DIAGNOSIS — M7502 Adhesive capsulitis of left shoulder: Secondary | ICD-10-CM | POA: Diagnosis not present

## 2019-05-19 NOTE — Telephone Encounter (Signed)
[name] Shipment Received:  65m #1 prefilled syringe Medication arrival date: 05/19/2019  Lot #: [id number] date: 07/2020 Received by: tLaurette Schimke

## 2019-05-20 DIAGNOSIS — G8929 Other chronic pain: Secondary | ICD-10-CM | POA: Diagnosis not present

## 2019-05-20 DIAGNOSIS — R634 Abnormal weight loss: Secondary | ICD-10-CM | POA: Diagnosis not present

## 2019-05-20 DIAGNOSIS — M25512 Pain in left shoulder: Secondary | ICD-10-CM | POA: Diagnosis not present

## 2019-06-02 ENCOUNTER — Ambulatory Visit (INDEPENDENT_AMBULATORY_CARE_PROVIDER_SITE_OTHER): Payer: BC Managed Care – PPO

## 2019-06-02 ENCOUNTER — Other Ambulatory Visit: Payer: Self-pay

## 2019-06-02 DIAGNOSIS — J4550 Severe persistent asthma, uncomplicated: Secondary | ICD-10-CM | POA: Diagnosis not present

## 2019-06-02 MED ORDER — BENRALIZUMAB 30 MG/ML ~~LOC~~ SOSY
30.0000 mg | PREFILLED_SYRINGE | Freq: Once | SUBCUTANEOUS | Status: AC
  Administered 2019-06-02: 30 mg via SUBCUTANEOUS

## 2019-06-02 NOTE — Progress Notes (Signed)
Have you been hospitalized within the last 10 days?  No Do you have a fever?  No Do you have a cough?  No Do you have a headache or sore throat? No

## 2019-06-08 DIAGNOSIS — J4550 Severe persistent asthma, uncomplicated: Secondary | ICD-10-CM | POA: Diagnosis not present

## 2019-06-17 DIAGNOSIS — J4550 Severe persistent asthma, uncomplicated: Secondary | ICD-10-CM | POA: Diagnosis not present

## 2019-06-23 ENCOUNTER — Telehealth: Payer: Self-pay | Admitting: Internal Medicine

## 2019-06-23 NOTE — Telephone Encounter (Signed)
Called [facility] _[phone] to place order for patient's [name] - upcoming injection is scheduled for 8.12.2020.  Per [facility], patient had put a blanket statement on his account that medication may be shipped out as long as the copay remains $0 and per rep [facility] patient's copay has changed.  [facility] will contact patient to discuss and gain authorization to ship out med.  Will call back tomorrow to check status.

## 2019-06-24 NOTE — Telephone Encounter (Signed)
[name] states they will be [employer]. Would like to know if patient has any allergies.  [place] phone is [phone].

## 2019-06-24 NOTE — Telephone Encounter (Signed)
[employer] states [name] being shipped on 06/25/2019 to office.  [place] phone number is [phone].

## 2019-06-24 NOTE — Telephone Encounter (Signed)
Called [facility] and spoke with [place] in regards to the message we received from [place] who wanted to let us know that pt's [name] will be shipping tomorrow that they would like to know if he has any allergies.  When speaking with Cherie Ouch, she did not see any notation in regards to anyone calling our office saying that the [name] will be shipping. Cherie Ouch said the last note they have was where they were needing to contact pt to let him know that his copay had changed but they have not been able to get ahold of pt yet to discuss this with him.  Routing back to injection pool.

## 2019-06-24 NOTE — Telephone Encounter (Signed)
Called [facility] and spoke with Yvetta Coder who reported that they have called the patient to authorize shipment but have not been able to speak with him as of yet.  This office also received a fax from [facility] stating that patient's [name] is requiring a PA.  Asked Yvetta Coder about this and she denied any documentation in their system supporting this claim.  Per patient's chart 5.11.2020 phone note, patient's PA for [name] is good through 5.14.2021.  Will disregard fax.  Called spoke with patient, made him aware that [facility] has tried to reach him about his copay amount and for authorization for shipment.  Patient is aware.  Will call [facility] tomorrow to see if patient's [name] can be shipped.

## 2019-06-25 NOTE — Telephone Encounter (Signed)
[name] Shipment Received:  75m #1 prefilled syringe Medication arrival date: 06/25/19  Lot #: [id number] date: 08/19/2020 Received by: JParke Poisson CMA

## 2019-06-25 NOTE — Telephone Encounter (Signed)
LMOM TCB x1 for pt - just wanted to let him know that we received his [place] shipment today.

## 2019-07-01 ENCOUNTER — Ambulatory Visit (INDEPENDENT_AMBULATORY_CARE_PROVIDER_SITE_OTHER): Payer: BC Managed Care – PPO

## 2019-07-01 ENCOUNTER — Other Ambulatory Visit: Payer: Self-pay

## 2019-07-01 DIAGNOSIS — J4550 Severe persistent asthma, uncomplicated: Secondary | ICD-10-CM

## 2019-07-01 MED ORDER — BENRALIZUMAB 30 MG/ML ~~LOC~~ SOSY
30.0000 mg | PREFILLED_SYRINGE | Freq: Once | SUBCUTANEOUS | Status: AC
  Administered 2019-07-01: 30 mg via SUBCUTANEOUS

## 2019-07-01 NOTE — Progress Notes (Signed)
Have you been hospitalized within the last 10 days?  No Do you have a fever?  No Do you have a cough?  No Do you have a headache or sore throat? No Do you have your Epi Pen visible and is it within date?  Yes 

## 2019-07-02 NOTE — Telephone Encounter (Signed)
Patient came to office 07/01/19 for Fasenra injection.  Nothing further at this time.

## 2019-07-07 ENCOUNTER — Other Ambulatory Visit: Payer: Self-pay | Admitting: Infectious Disease

## 2019-07-07 ENCOUNTER — Telehealth: Payer: Self-pay

## 2019-07-07 ENCOUNTER — Other Ambulatory Visit: Payer: Self-pay | Admitting: Internal Medicine

## 2019-07-07 NOTE — Telephone Encounter (Signed)
He should DEF have refill of his azithro with years worth of meds. He will deteriorate pretty quickly if he comes off M avium drugs or his anti Aspergillus therapy

## 2019-07-07 NOTE — Telephone Encounter (Signed)
Attempted to call patient to schedule overdue office visit with Dr. Tommy Medal. Patient pharmacy is requesting refill on Azithroymcin, but medication is not listed in current med list. Will route message to MD to advise on refills. Left vm requesting patient call office back for appt. [place]

## 2019-07-08 ENCOUNTER — Telehealth: Payer: Self-pay

## 2019-07-08 NOTE — Telephone Encounter (Signed)
Patient stating his insurance did not pay for his azithromycin.  I advised patient to call his insurance company and ask the reason for the payment changes.   He is to call our office back if we need to submit prior authorizations or complete paper work.    Laverle Patter, RN

## 2019-07-09 ENCOUNTER — Telehealth: Payer: Self-pay | Admitting: *Deleted

## 2019-07-09 NOTE — Telephone Encounter (Signed)
PA for azithromycin 500 mg submitted via [email or web address] with an immediate favorable outcome. Received notification PA Case: [id number], Status: Approved, Coverage Starts on: 07/09/2019 12:00:00 AM, Coverage Ends on: 07/08/2020 12:00:00 AM. Notified pharmacy, patient. Landis Gandy, RN

## 2019-07-18 IMAGING — DX DG CHEST 1V PORT
1 series · 1 of 1 positions shown · non-contrast
Comparison: 09/17/2018

CLINICAL DATA: Pneumothorax

EXAM:
PORTABLE CHEST 1 VIEW

[chest ap]
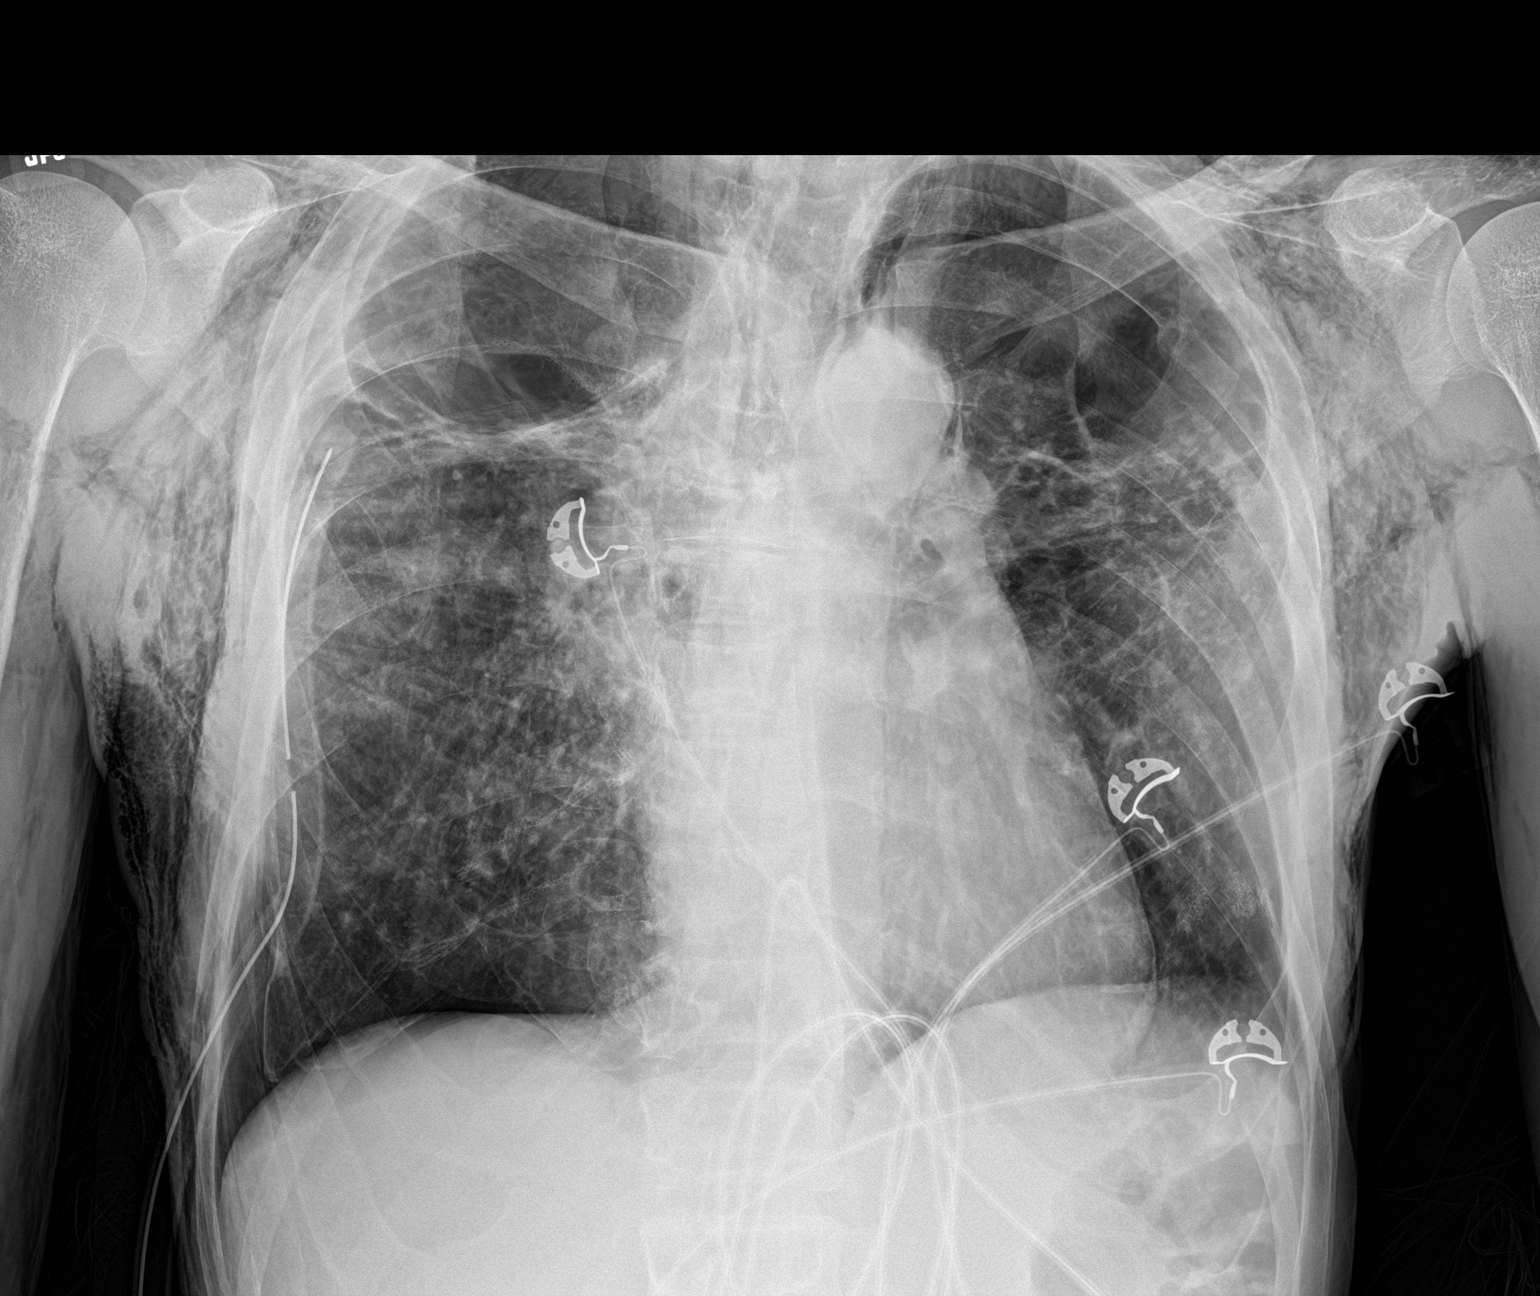

[1 of 1 positions shown; findings below may reference images not displayed]

FINDINGS: Right chest tube is stable. Small pneumothorax at the right base is
stable. Apical blebs are unchanged. Normal heart size. Stable
emphysema across the chest.
IMPRESSION: Stable right chest tube and small right basilar pneumothorax.

## 2019-07-20 ENCOUNTER — Other Ambulatory Visit: Payer: Self-pay | Admitting: *Deleted

## 2019-07-20 DIAGNOSIS — A310 Pulmonary mycobacterial infection: Secondary | ICD-10-CM

## 2019-07-22 ENCOUNTER — Other Ambulatory Visit: Payer: BC Managed Care – PPO

## 2019-07-22 ENCOUNTER — Telehealth: Payer: Self-pay | Admitting: Internal Medicine

## 2019-07-22 ENCOUNTER — Other Ambulatory Visit: Payer: Self-pay

## 2019-07-22 ENCOUNTER — Other Ambulatory Visit: Payer: Self-pay | Admitting: Internal Medicine

## 2019-07-22 DIAGNOSIS — A310 Pulmonary mycobacterial infection: Secondary | ICD-10-CM | POA: Diagnosis not present

## 2019-07-22 MED ORDER — ALBUTEROL SULFATE HFA 108 (90 BASE) MCG/ACT IN AERS: 2.0000 | INHALATION_SPRAY | Freq: Four times a day (QID) | RESPIRATORY_TRACT | 0 refills | Status: DC | PRN

## 2019-07-22 NOTE — Telephone Encounter (Signed)
Spoke with pt and advised rx for Ventolin was sent to [facility]. Nothing further is needed.

## 2019-07-23 LAB — CBC WITH DIFFERENTIAL/PLATELET
Absolute Monocytes: 518 cells/uL (ref 200–950)
Basophils Absolute: 7 cells/uL (ref 0–200)
Basophils Relative: 0.1 %
Eosinophils Absolute: 0 cells/uL — ABNORMAL LOW (ref 15–500)
Eosinophils Relative: 0 %
HCT: 38.7 % (ref 38.5–50.0)
Hemoglobin: 13.2 g/dL (ref 13.2–17.1)
Lymphs Abs: 1206 cells/uL (ref 850–3900)
MCH: 31.8 pg (ref 27.0–33.0)
MCHC: 34.1 g/dL (ref 32.0–36.0)
MCV: 93.3 fL (ref 80.0–100.0)
MPV: 9.4 fL (ref 7.5–12.5)
Monocytes Relative: 7 %
Neutro Abs: 5668 cells/uL (ref 1500–7800)
Neutrophils Relative %: 76.6 %
Platelets: 274 10*3/uL (ref 140–400)
RBC: 4.15 10*6/uL — ABNORMAL LOW (ref 4.20–5.80)
RDW: 12.4 % (ref 11.0–15.0)
Total Lymphocyte: 16.3 %
WBC: 7.4 10*3/uL (ref 3.8–10.8)

## 2019-07-23 LAB — COMPLETE METABOLIC PANEL WITH GFR
AG Ratio: 1.5 (calc) (ref 1.0–2.5)
ALT: 13 U/L (ref 9–46)
AST: 15 U/L (ref 10–40)
Albumin: 3.7 g/dL (ref 3.6–5.1)
Alkaline phosphatase (APISO): 121 U/L (ref 36–130)
BUN: 7 mg/dL (ref 7–25)
CO2: 22 mmol/L (ref 20–32)
Calcium: 8.9 mg/dL (ref 8.6–10.3)
Chloride: 105 mmol/L (ref 98–110)
Creat: 0.67 mg/dL (ref 0.60–1.35)
GFR, Est African American: 134 mL/min/{1.73_m2} (ref >=60)
GFR, Est Non African American: 116 mL/min/{1.73_m2} (ref >=60)
Globulin: 2.5 g/dL (calc) (ref 1.9–3.7)
Glucose, Bld: 133 mg/dL — ABNORMAL HIGH (ref 65–99)
Potassium: 4.1 mmol/L (ref 3.5–5.3)
Sodium: 139 mmol/L (ref 135–146)
Total Bilirubin: 0.6 mg/dL (ref 0.2–1.2)
Total Protein: 6.2 g/dL (ref 6.1–8.1)

## 2019-07-24 ENCOUNTER — Ambulatory Visit (INDEPENDENT_AMBULATORY_CARE_PROVIDER_SITE_OTHER): Payer: BC Managed Care – PPO | Admitting: Adult Health

## 2019-07-24 ENCOUNTER — Other Ambulatory Visit: Payer: Self-pay

## 2019-07-24 ENCOUNTER — Encounter: Payer: Self-pay | Admitting: Adult Health

## 2019-07-24 ENCOUNTER — Telehealth: Payer: Self-pay | Admitting: Adult Health

## 2019-07-24 DIAGNOSIS — J82 Pulmonary eosinophilia, not elsewhere classified: Secondary | ICD-10-CM

## 2019-07-24 DIAGNOSIS — J4550 Severe persistent asthma, uncomplicated: Secondary | ICD-10-CM | POA: Diagnosis not present

## 2019-07-24 DIAGNOSIS — J8283 Eosinophilic asthma: Secondary | ICD-10-CM

## 2019-07-24 MED ORDER — AMOXICILLIN-POT CLAVULANATE 875-125 MG PO TABS: 1.0000 | ORAL_TABLET | Freq: Two times a day (BID) | ORAL | 0 refills | Status: AC

## 2019-07-24 MED ORDER — ALBUTEROL SULFATE HFA 108 (90 BASE) MCG/ACT IN AERS: 2.0000 | INHALATION_SPRAY | Freq: Four times a day (QID) | RESPIRATORY_TRACT | 2 refills | Status: DC | PRN

## 2019-07-24 NOTE — Patient Instructions (Addendum)
Continue  on  BREO  1 puff daily , rinse after use. Continue on Spiriva 2 puffs daily  Prednisone 75m daily for 1 weeks and then 518mdaily .  Please eat often add High protein food and drinks.  Augmentin 87576mwice daily  For 1 week , take with food .to have on hold if symptoms worsen with discolored mucus or do not improve.  Follow up with Dr. RamChase Caller Parrett NP in 4 weeks  and As needed   Please contact office for sooner follow up if symptoms do not improve or worsen or seek emergency care

## 2019-07-24 NOTE — Progress Notes (Signed)
Virtual Visit via Telephone Note  I connected with [name] on 07/24/19 at  4:15 PM EDT by telephone and verified that I am speaking with the correct person using two identifiers.  Location: Patient: Home  Provider: Office    I discussed the limitations, risks, security and privacy concerns of performing an evaluation and management service by telephone and the availability of in person appointments. I also discussed with the patient that there may be a patient responsible charge related to this service. The patient expressed understanding and agreed to proceed.   History of Present Illness: 43 year old male never smoker followed forsevere persistent asthma complicated by ABPA , MAC and Aspergilloma . Hemoptysis s/p IR guided embolization 2016 .  Patient has a history of aspergilloma , status post VATS in 2012. 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 Followed at [facility] for MAC on ethambutol and azithromycin .   Today's televisit is for an acute office visit. Patient complains of 1 week of increased cough , wheezing . Intermittently coughing up [place]/yellow mucus . Says this is chronic for him hard to tell if worse or part of his chronic symptoms . No fever. Appetite is same. Now on megace .  No otc used. Remains on BREO and Spiriva .  On fasenra now . has had 2 injections .    Had complicated Tension pneumothorax October 2019 for 1 month. Out of work from October till July this year. Has applied for Disability but is still pending . Trying to work but is very difficult for him.    Observations/Objective: TEST  1.7/13 Ig E 800s ( 4000 2012 )  12/10/11 Sputum AFB Smear and culture negative (prelim) 12/10/11 Fungal sputum culture - negative (final) 01/30/2012 spirometry - fev1 1.7L/41%, ratip 52 - BEST EVER 01/30/2012 walking desaturation test: 185 feet x 3 laps: did not deaturate 05/2014 >Spiro fev1 1.7L/40%, ratio 55 Spirometry 06/25/2017  showed FEV1 at 28%, ratio 46, FVC 50%.  Events  2010 Dx with MAI >Azithro/Ethambutol 2012 Dx with Aspergilloma >VATS >started on Voriconazaol  2012 Dx w/ ABPA w/ High IgE >4000>started on prednisone  2013 tried off azithr/Etham but developed fever, Cx neg but restarted on rx.  2016 Hospitalzed with hemoptysis >IR guided embolization . FOB/BAL + Saccharomyces on fungal fx . Serum Galactomannan was neg.  [name] evaluation 04/2017 for Transplant -rejected. Felt to early for transplant- recommended pre-transplant weight goal 128 lbs for a BMI > 19  Assessment and Plan: Bronchiectasis flare  AB -mild flare   Plan  Patient Instructions  Continue  on  BREO  1 puff daily , rinse after use. Continue on Spiriva 2 puffs daily  Prednisone 129mdaily for 1 weeks and then 10m12maily .  Please eat often add High protein food and drinks.  Augmentin 8710m48mice daily  For 1 week , take with food .to have on hold if symptoms worsen with discolored mucus or do not improve.  Follow up with Dr. RamaChase CallerParrett NP in 4 weeks  and As needed   Please contact office for sooner follow up if symptoms do not improve or worsen or seek emergency care        Follow Up Instructions: Follow up in 4 weeks and As needed     I discussed the assessment and treatment plan with the patient. The patient was provided an opportunity to ask questions and all were answered. The patient agreed with the plan  and demonstrated an understanding of the instructions.   The patient was advised to call back or seek an in-person evaluation if the symptoms worsen or if the condition fails to improve as anticipated.  I provided  22 minutes of non-face-to-face time during this encounter.   Rexene Edison, NP

## 2019-07-24 NOTE — Telephone Encounter (Signed)
LMOM TCB x1 @ 1542 to begin patient's televisit  LMOM TCB x2 @ 1115 to begin patient's [id number]

## 2019-07-25 IMAGING — DX DG CHEST 1V PORT
1 series · 1 of 1 positions shown · non-contrast
Comparison: Yesterday.

CLINICAL DATA: Followup right basilar pneumothorax with a chest
tube in place.

EXAM:
PORTABLE CHEST 1 VIEW

[chest]
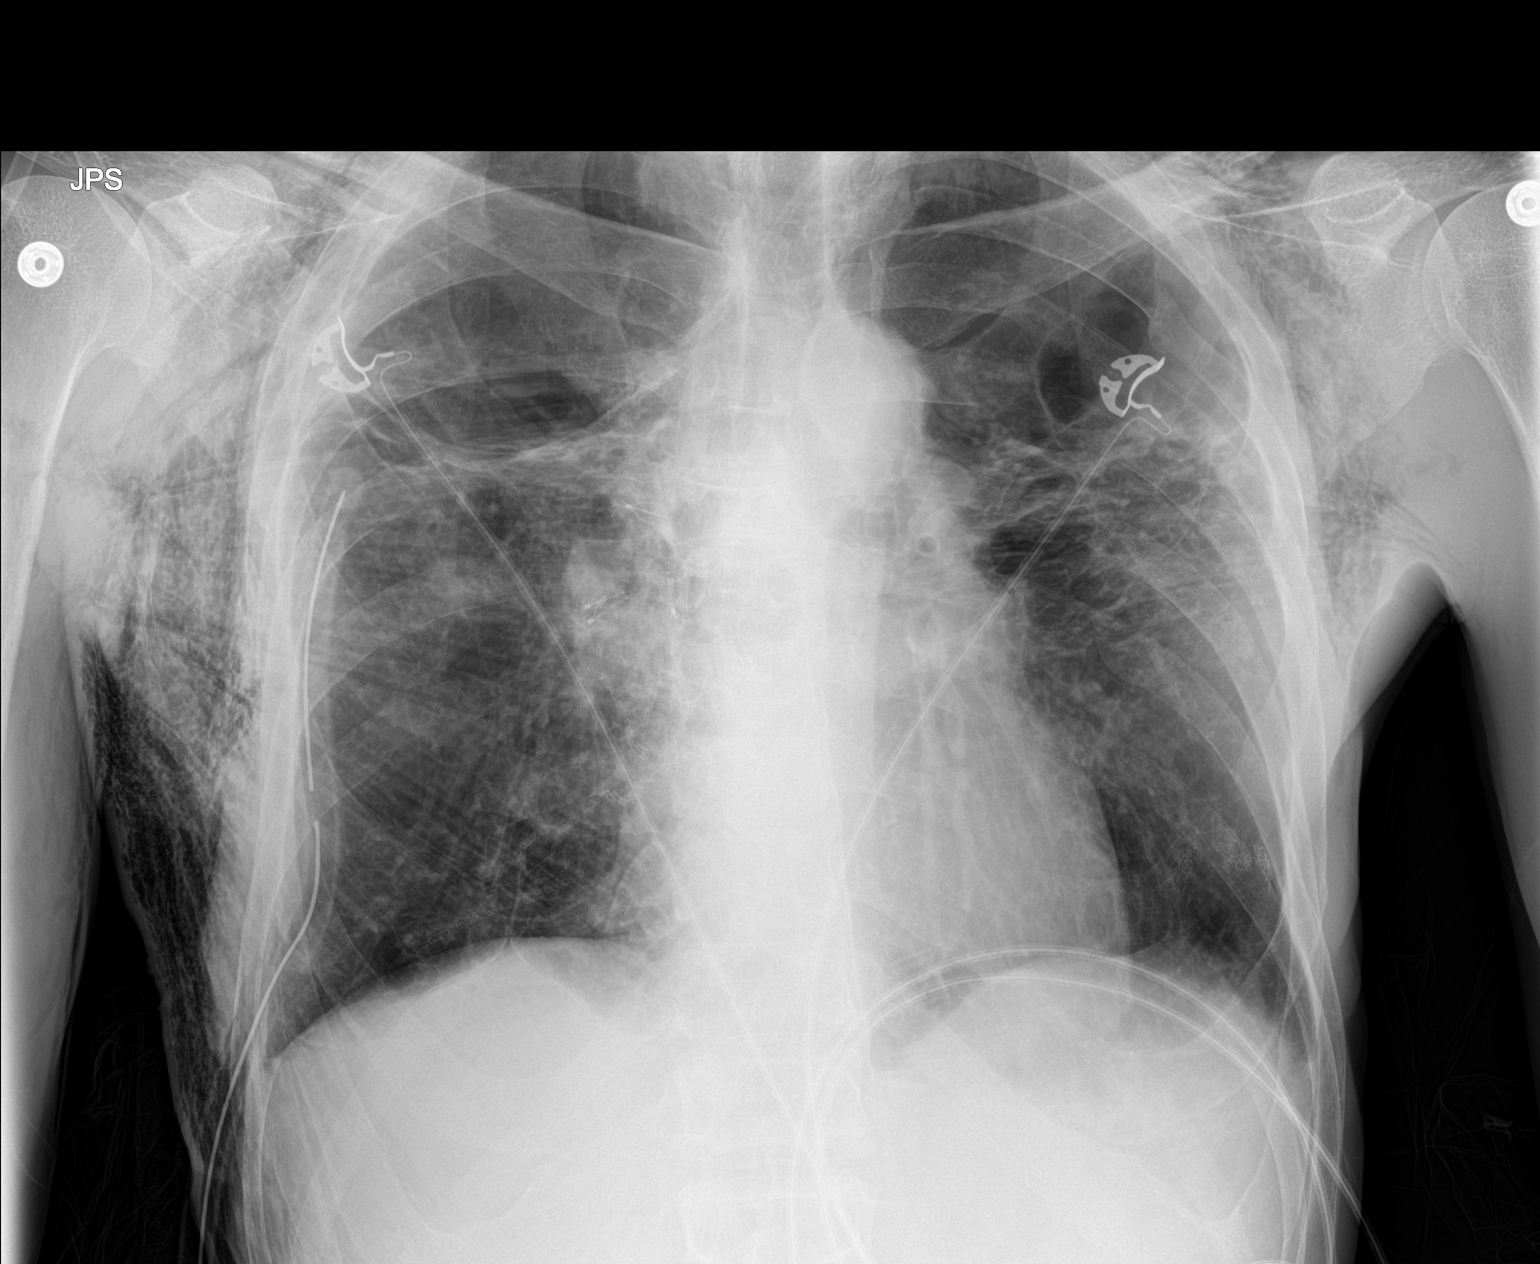

[1 of 1 positions shown; findings below may reference images not displayed]

FINDINGS: Stable right chest tube. Very small right basilar pneumothorax
without gross change. Stable biapical bullous changes and right
surgical staples and vascular devices. Left basilar surgical staples
are again demonstrated. No significant change in bilateral
subcutaneous emphysema. Normal sized heart. Unremarkable bones.
IMPRESSION: 1. No significant change in a less than 5% right basilar
pneumothorax with a chest tube in place.
2. Stable changes of COPD.

## 2019-07-26 IMAGING — DX DG CHEST 1V PORT
1 series · 1 of 1 positions shown · non-contrast
Comparison: Yesterday

CLINICAL DATA: Chest tube follow-up

EXAM:
PORTABLE CHEST 1 VIEW

[chest ap]
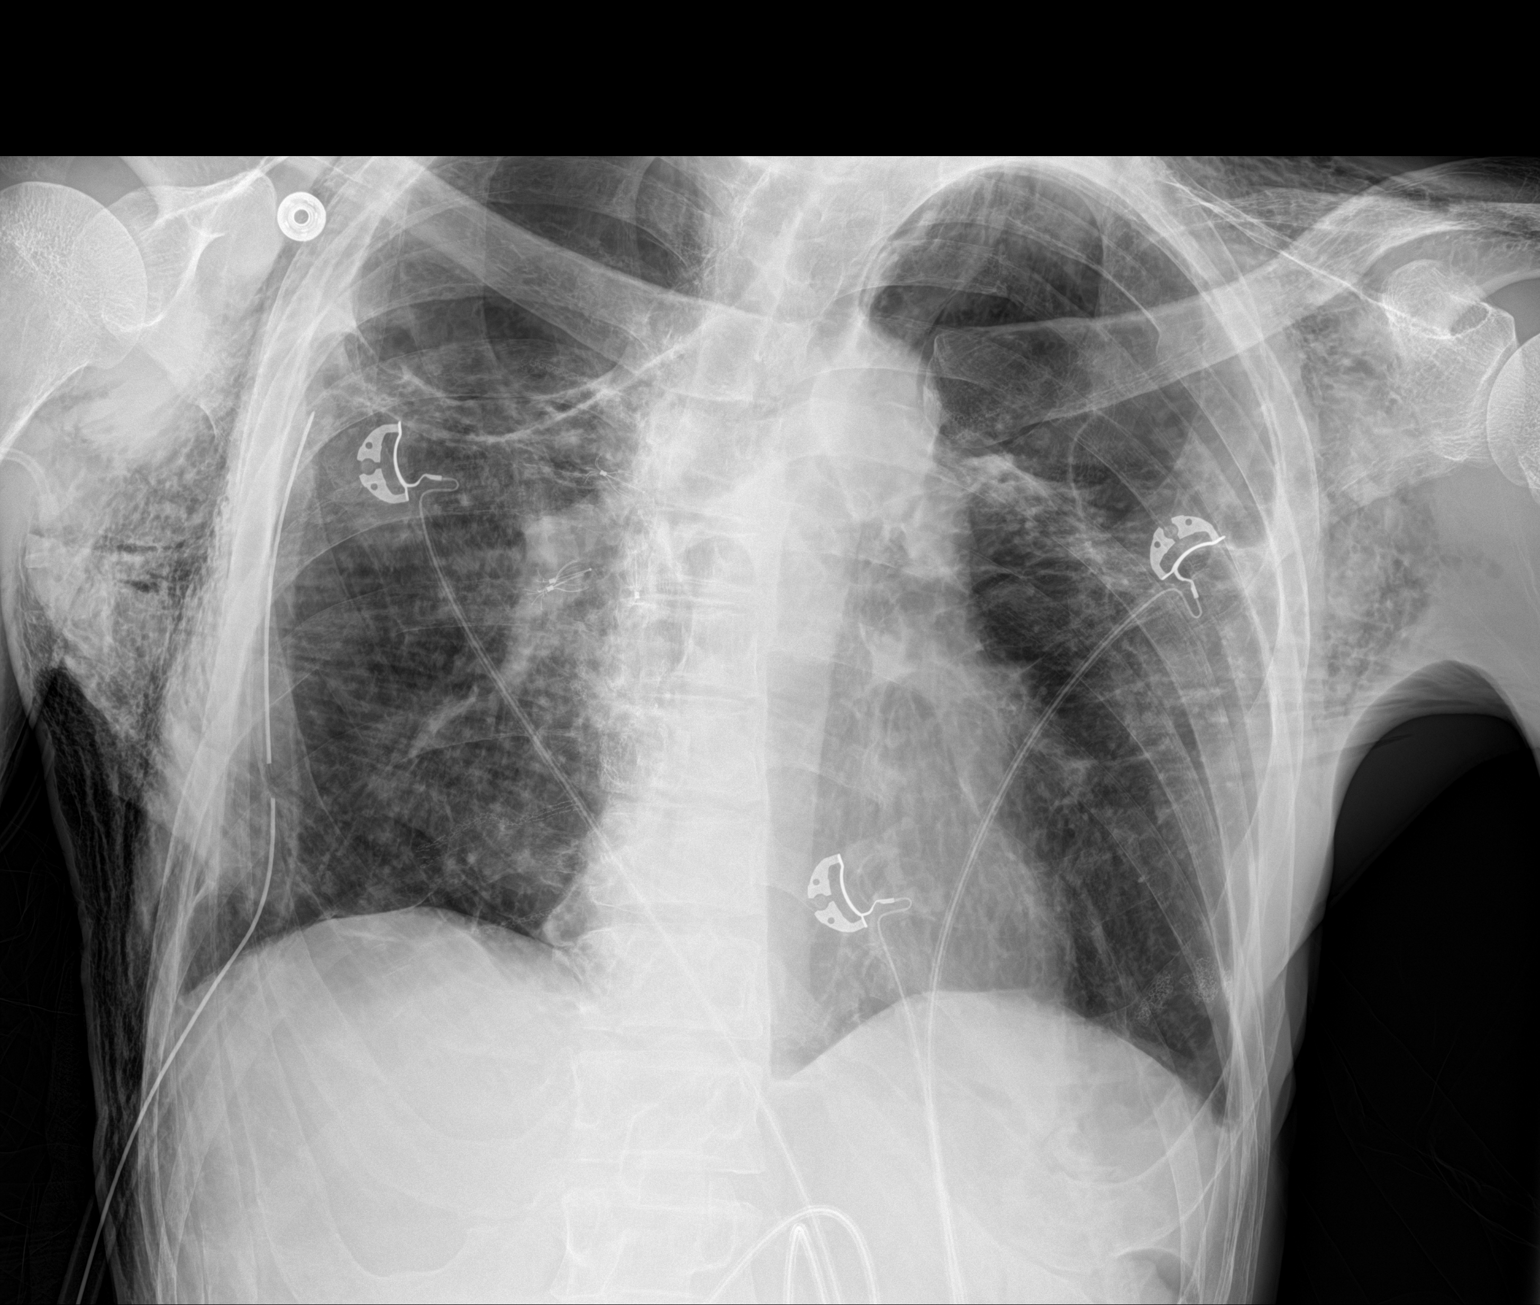

[1 of 1 positions shown; findings below may reference images not displayed]

FINDINGS: Stable positioning of right-sided chest tube. Small pneumothorax at
the right base is unchanged. Apical bullae again noted. No acute
opacity. Endobronchial valves present on the right. Normal heart
size. Stable soft tissue emphysema.
IMPRESSION: 1. Unchanged trace pneumothorax at the right base.
2. Similar positioning of endobronchial valves.
3. Unchanged chest tube positioning.

## 2019-07-27 IMAGING — DX DG CHEST 1V PORT
1 series · 1 of 1 positions shown · non-contrast
Comparison: 09/26/2018 and 09/08/2018

CLINICAL DATA: Spontaneous pneumothorax with chest tube in place.

EXAM:
PORTABLE CHEST 1 VIEW

[chest ap]
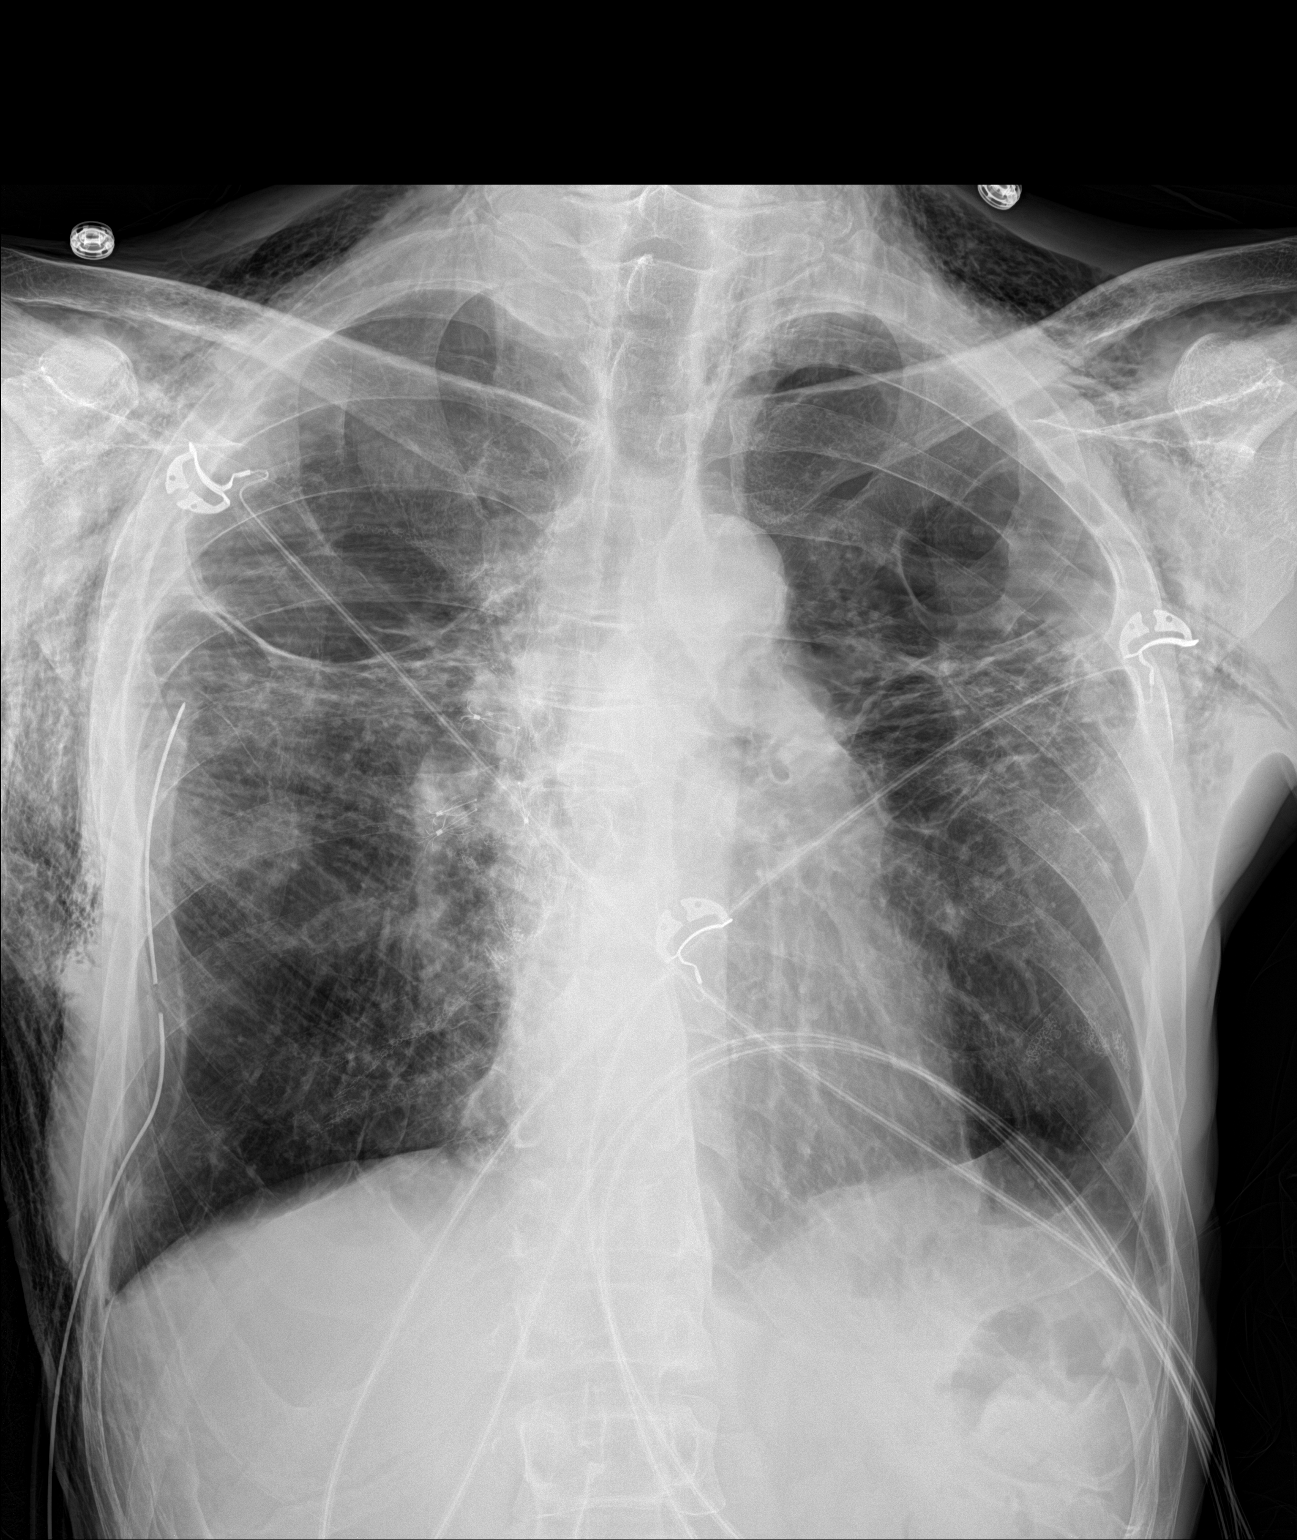

[1 of 1 positions shown; findings below may reference images not displayed]

FINDINGS: Right-sided chest tube unchanged. No definite right-sided
pneumothorax visualized. Postsurgical change and severe
emphysematous disease unchanged. Cardiomediastinal silhouette
unchanged. Moderate subcutaneous emphysema throughout the thorax
unchanged.
IMPRESSION: Severe emphysematous disease and postsurgical change which is
stable.

Right-sided chest tube unchanged. No definite pneumothorax
visualized.

Moderate subcutaneous emphysema over the thorax unchanged.

## 2019-07-29 ENCOUNTER — Telehealth: Payer: Self-pay | Admitting: Internal Medicine

## 2019-07-29 ENCOUNTER — Telehealth: Payer: Self-pay

## 2019-07-29 ENCOUNTER — Other Ambulatory Visit: Payer: Self-pay | Admitting: Family

## 2019-07-29 DIAGNOSIS — A310 Pulmonary mycobacterial infection: Secondary | ICD-10-CM

## 2019-07-29 IMAGING — DX DG CHEST 1V PORT
1 series · 1 of 1 positions shown · non-contrast
Comparison: Film from earlier in the same day.

CLINICAL DATA: Follow-up chest tube repositioning

EXAM:
PORTABLE CHEST 1 VIEW

[chest]
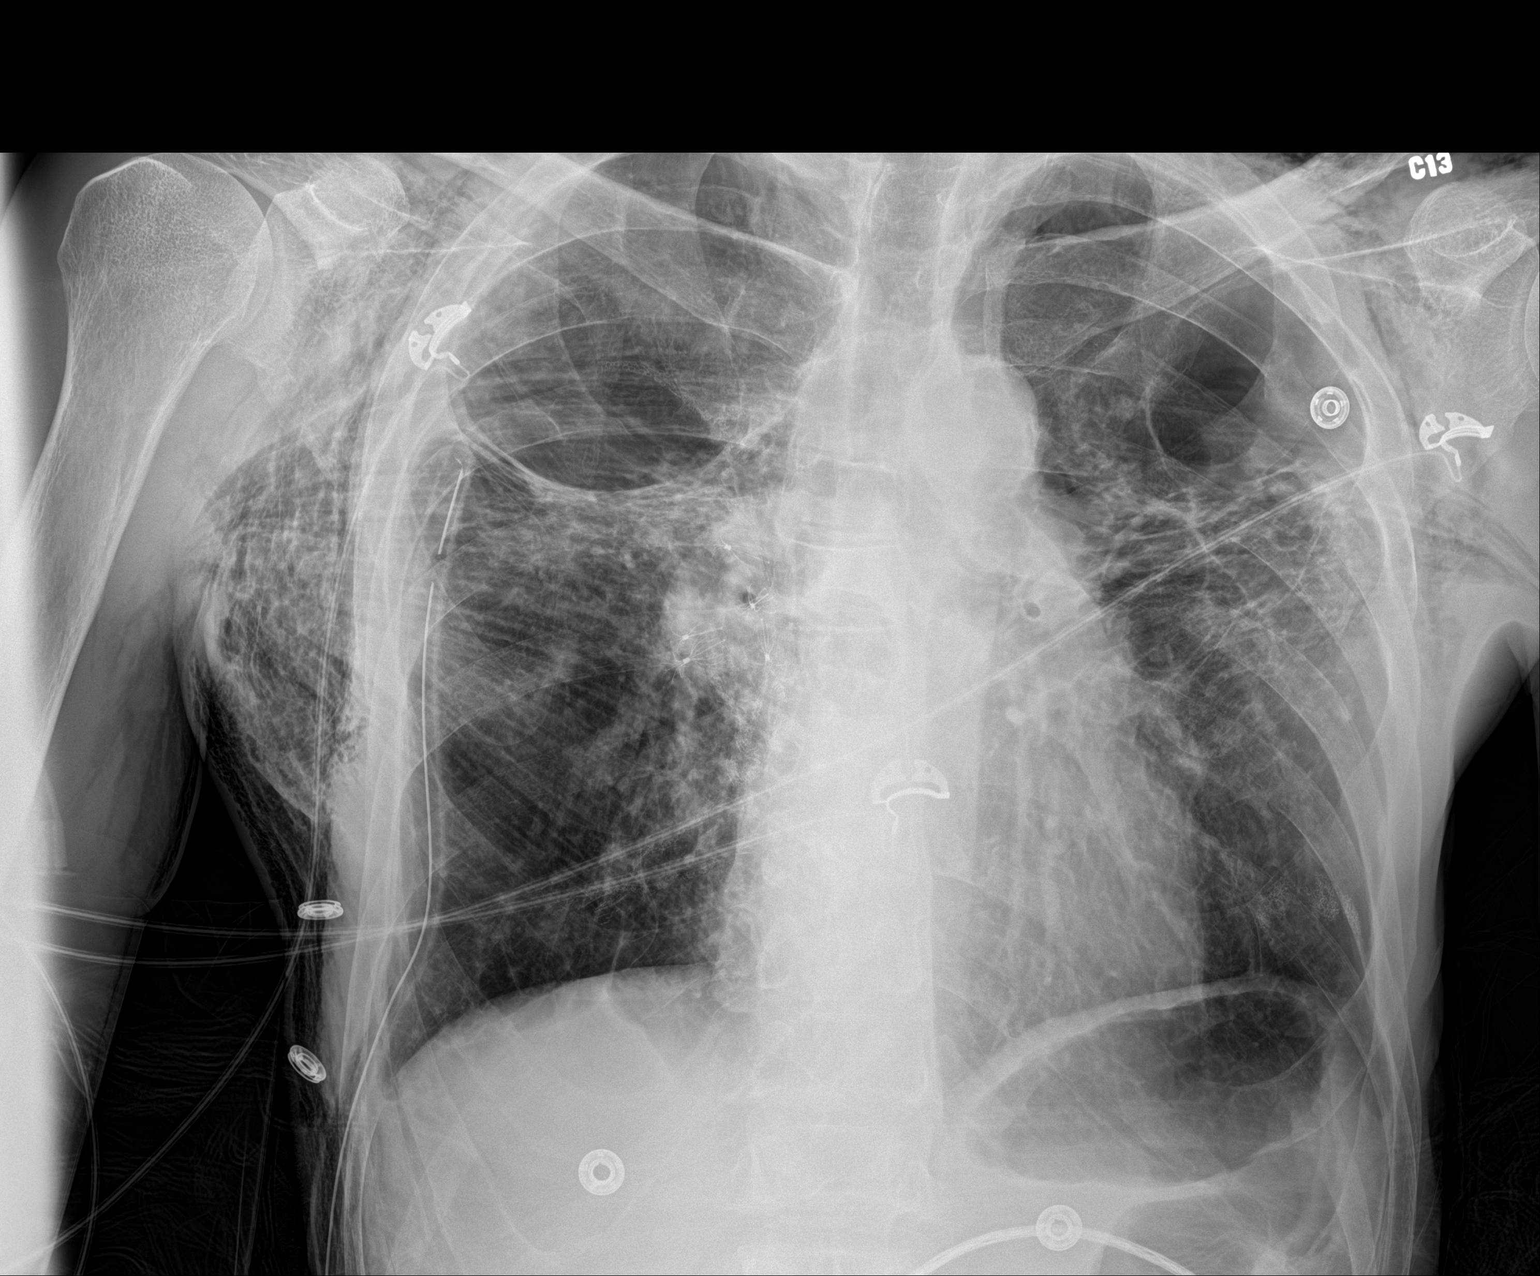

[1 of 1 positions shown; findings below may reference images not displayed]

FINDINGS: Cardiac shadow is stable. Aortic calcifications are again seen.
Large apical blebs are again identified. Diffuse scarring is noted
bilaterally and stable. Multiple endobronchial plugs are noted on
the right. Previously seen chest tube is been removed in the
interval and replaced with a new chest tube in similar positioning.
No definitive pneumothorax is noted at this time. Considerable
subcutaneous air remains. No bony abnormality is noted. No
infiltrate is seen.
IMPRESSION: Status post chest tube exchange. No significant pneumothorax is
noted.

The remainder of the exam is stable from the prior study.

## 2019-07-29 NOTE — Telephone Encounter (Signed)
Call returned to patient, requesting refill of ethambutol. I made him aware Dr. Tommy Medal manages this medication and being that MR is not in clinic he needs to try to get the prescribing MD to sign. Voiced understanding. Nothing further needed at this time.

## 2019-07-29 NOTE — Telephone Encounter (Signed)
Patient called office today regarding lab work that was done on 9/2. Patient is concerned about his Glucose level,and would like to know if he needs to be on medicine for diabetes. Advised patient to contact PCP regarding lab since they would be able to monitor his blood sugars. [place]

## 2019-08-06 ENCOUNTER — Other Ambulatory Visit: Payer: Self-pay

## 2019-08-06 ENCOUNTER — Ambulatory Visit: Payer: BC Managed Care – PPO | Admitting: Infectious Disease

## 2019-08-06 ENCOUNTER — Telehealth: Payer: Self-pay | Admitting: Pharmacy Technician

## 2019-08-06 ENCOUNTER — Encounter: Payer: Self-pay | Admitting: Infectious Disease

## 2019-08-06 VITALS — BP 125/85 | HR 65 | Temp 98.6°F

## 2019-08-06 DIAGNOSIS — B440 Invasive pulmonary aspergillosis: Secondary | ICD-10-CM

## 2019-08-06 DIAGNOSIS — B4481 Allergic bronchopulmonary aspergillosis: Secondary | ICD-10-CM

## 2019-08-06 DIAGNOSIS — R739 Hyperglycemia, unspecified: Secondary | ICD-10-CM

## 2019-08-06 DIAGNOSIS — A310 Pulmonary mycobacterial infection: Secondary | ICD-10-CM | POA: Diagnosis not present

## 2019-08-06 DIAGNOSIS — B449 Aspergillosis, unspecified: Secondary | ICD-10-CM | POA: Diagnosis not present

## 2019-08-06 DIAGNOSIS — Z23 Encounter for immunization: Secondary | ICD-10-CM

## 2019-08-06 MED ORDER — CRESEMBA 186 MG PO CAPS: 2.0000 | ORAL_CAPSULE | Freq: Every day | ORAL | 5 refills | Status: DC

## 2019-08-06 MED FILL — CRESEMBA 186 MG CAPSULE: 186 | 28 days supply | Qty: 56 | Fill #0

## 2019-08-06 NOTE — Progress Notes (Signed)
Chief complaint: He is very frustrated with his mail order [facility]  Subjective:  Chief complaint:  : Patient ID: [name], male    DOB: [birth year], 43 y.o.   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 We had taken him off his Mycobacterium avium drugs and then he developed fevers chills and malaise  Spring (2013) and was seen by my partner Dr. Megan Salon in late April  His AFB smears and cultures obtained in the clinic on that day prior to the initiation of azithromycin ethambutol ultimately failed to grow Mycobacterium avium. He had been doing well I saw him in [place] 2013 we discontinued his azithromycin and ethambutol again. Unfortunately he again shortly thereafter developed fevers cough malaise. He started back on azithromycin ethambutol. Cultures done for AFB here were again negative. The patient however felt dramatically better having been back on azithromycin and ethambutol.   I saw him in January of 2015 when he was feeling relatively well.  He did have an episode of coughing up a few flecks of blood in January 2016 which prompted appt with [facility] but by time he made appt this had long since resolved. Was only a one time episode none since then.  I saw again in March and then in September 2016. In summer he was hospitalized with hemoptysis and found to have an area in the upper lobe consistent with an aspergilloma. He underwent bronchoscopy with BAL and cultures ultimately yielded a Saccharomyces species on fungal culture. His serum galactomannan was negative.  He has been  on voriconazole and axial has had radiographic improvement and his x-ray findings. He also  feelt etter symptomatically with no more hemoptysis improvement in his cough improvement in his energy though he still feels not nearly as well as he did prior to his hospitalization. He is avoiding exercised during the winter as it often precipitates coughing spells. He was  only taking 200 mg twice a day of voriconazole rather than the 300 mg he has continued this along with his ethambutol and azithromycin. He continued to have cough more so with exercise and has not been exercising at "[employer]" due to the fact this elicits more coughing. He has not smoked since 2007. He is seeing [name] and is being considered for new drug infusion to treat his COPD.  He did have 20# weight loss that he attributed to increased exercise.  He then saw  [name] after an episode of hematemesis. 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 He said he only coughed up a small amount of blood material and did not go to the ER but was seen and followed by pulmonary. Had a chest x-ray which showed no new findings.  He was then  going to be evaluated by the transplant team at [facility].  He did complain of hyperpigmented spots on sun exposed areas on his body including arms and neck which his dermatologist have told him is due to his voriconazole use.   We considered change at that time  Since that time he has [place] been admitted with hemoptysis at [facility].   Repeat CT lungs showed on 08/09/17:  Large emphysematous bulla are noted  in both upper lobes. There is interval development of fluid density in several bulla in the left upper lobe concerning for superimposed infection, possibly fungal in origin.  Stable bilateral calcified pulmonary nodules are noted consistent with prior granulomatous disease.  Stable tree in bud appearance is noted medially in the left lung posteriorly consistent with sequela of previous atypical infection.   Due to his skin toxicity we  decided to change him to [facility] and he is on that now .  And has done well.  Since I last saw [name] he has had multiple problems related to pneumothoraces.  This required placement of an intrabronchial valve in November 2019.  This resolved but then he had to be taken back to the operating room in early January where the intrabronchial valves were removed.  Was seen at [facility] medical transplant team as well but felt not yet to be a transplant candidate due to the fact that his lungs were not in severe enough condition to warrant transplantation  He had applied for disability but this has not yet gone through.  Certainly he deserves to have disability and he has significant medical problems.  He did however eventually go back to work at [employer] recently due to his need for insurance with a high [employer] coverage payment of $700 per month.  He is been receiving his antimycobacterial drugs and is [facility] but they required a prior authorization and then they apparently lost the paperwork.  We will switch him to [employer]  Had a recent bout of something that sounds like a bit of a bronchitis that was resolving with Augmentin.  Past Medical History:  Diagnosis Date  . Anemia   . 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[place])   . Drug rash 12/16/2017  . Dyspnea   . Esophageal reflux    very rare  . 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 Lung disease, bullous ([place])   . MAI (mycobacterium avium-intracellulare) ([place])   . Mold exposure 12/05/2015  . 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 Solar lentigo 08/08/2015  . Weight loss 10/15/2016   Past Surgical History:  Procedure Laterality Date  . CHEST TUBE INSERTION Right 09/29/2018   Procedure: CHEST TUBE REPLACEMENT;  Surgeon: Melrose Nakayama, MD;  Location: [place];  Service: Thoracic;  Laterality: Right;  . INSERTION OF IBV VALVE N/A 09/19/2018   Procedure: INSERTION OF INTERBRONCHIAL  VALVE (IBV);  Surgeon: Melrose Nakayama, MD;  Location[facility];  Service: Thoracic;  Laterality: N/A;  . INSERTION OF IBV VALVE N/A 12/01/2018   Procedure: REMOVAL OF INTERBRONCHIAL VALVE (IBV);  Surgeon: Melrose Nakayama, MD;  Location: [facility] OR;  Service: Thoracic;  Laterality: N/A;  . left VATS  2012   thoracotomy and LUL apical posterior segmentectomy  . STAPLING OF BLEBS Right 09/12/2018   Procedure: STAPLING OF BLEBS, right lower and middle lung lobes;  Surgeon: Melrose Nakayama, MD;  Location: [place];  Service: Thoracic;  Laterality: Right;  Marland Kitchen VIDEO ASSISTED THORACOSCOPY Right 09/12/2018   Procedure: VIDEO ASSISTED THORACOSCOPY, right lung;  Surgeon: Melrose Nakayama, MD;  Location: [place];  Service: Thoracic;  Laterality: Right;  Marland Kitchen VIDEO BRONCHOSCOPY Bilateral 10/20/2015   Procedure: VIDEO BRONCHOSCOPY WITHOUT FLUORO;  Surgeon: Marshell Garfinkel, MD;  Location: [place];  Service: Cardiopulmonary;  Laterality: Bilateral;  . VIDEO BRONCHOSCOPY N/A 09/19/2018   Procedure: VIDEO BRONCHOSCOPY;  Surgeon: Melrose Nakayama, MD;  Location: [place];  Service: Thoracic;  Laterality: N/A;  . VIDEO BRONCHOSCOPY N/A 12/01/2018   Procedure: VIDEO BRONCHOSCOPY;  Surgeon: Melrose Nakayama, MD;  Location: [place];  Service: Thoracic;  Laterality: N/A;  . VIDEO BRONCHOSCOPY WITH INSERTION OF INTERBRONCHIAL VALVE (IBV) N/A 09/29/2018   Procedure: VIDEO BRONCHOSCOPY WITH INSERTION OF INTERBRONCHIAL VALVE  (IBV);  Surgeon: Melrose Nakayama, MD;  Location: [facility] OR;  Service: Thoracic;  Laterality: N/A;   Family History  Adopted: Yes   Social History   Tobacco Use  . Smoking status: Former Smoker    Packs/day: 0.70    Years: 19.00    Pack years: 13.30    Types: Cigarettes    Quit date: 11/20/2007    Years since quitting: 11.7  . Smokeless tobacco: Former Systems developer    Types: Snuff    Quit date: 11/19/1994  Substance Use Topics  . Alcohol use: No  . Drug use: No    Allergies   Allergen Reactions  . Clarithromycin Other (See Comments)    Adverse reaction w/ voriconazole UNSPECIFIED REACTION   . Rifaximin Other (See Comments)    Adverse reaction w/ voriconazole UNSPECIFIED REACTION     Current Outpatient Medications:  .  albuterol (VENTOLIN HFA) 108 (90 Base) MCG/ACT inhaler, Inhale 2 puffs into the lungs every 6 (six) hours as needed., Disp: 18 g, Rfl: 2 .  azithromycin (ZITHROMAX) 500 MG tablet, Take 1 tablet by mouth once daily, Disp: 30 tablet, Rfl: 11 .  Calcium Carbonate-Vitamin D 600-400 MG-UNIT tablet, Take 1 tablet by mouth daily. , Disp: , Rfl:  .  EPINEPHrine 0.3 mg/0.3 mL IJ SOAJ injection, Inject 0.3 mLs (0.3 mg total) into the muscle once for 1 dose., Disp: 1 Device, Rfl: 11 .  ethambutol (MYAMBUTOL) 400 MG tablet, TAKE 2& 1/2 TABLETS BY MOUTH ONCE DAILY, Disp: 598 tablet, Rfl: 5 .  feeding supplement, ENSURE ENLIVE, (ENSURE ENLIVE) LIQD, Take 237 mLs by mouth 3 (three) times daily between meals. (Patient taking differently: Take 237 mLs by mouth 2 (two) times daily between meals. ), Disp: 237 mL, Rfl: 12 .  ferrous sulfate (FERRO-BOB) 325 (65 FE) MG tablet, Take 325 mg by mouth daily.  , Disp: , Rfl:  .  fluticasone furoate-vilanterol (BREO ELLIPTA) 100-25 MCG/INH AEPB, Inhale 1 puff into the lungs daily., Disp: 1 each, Rfl: 5 .  guaiFENesin (MUCINEX) 600 MG 12 hr tablet, Take 1 tablet (600 mg total) by mouth 2 (two) times daily., Disp: 30 tablet, Rfl: 0 .  Isavuconazonium Sulfate (CRESEMBA) 186 MG CAPS, Take 2 capsules (372 mg total) by mouth daily., Disp: 60 capsule, Rfl: 5 .  levothyroxine (SYNTHROID, LEVOTHROID) 75 MCG tablet, Take 75 mcg by mouth daily before breakfast. , Disp: , Rfl: 0 .  megestrol (MEGACE) 20 MG tablet, Take 20 mg by mouth 3 (three) times daily., Disp: , Rfl:  .  predniSONE (DELTASONE) 5 MG tablet, Take 1 tablet by mouth once daily with breakfast, Disp: 90 tablet, Rfl: 0 .  Tiotropium Bromide Monohydrate (SPIRIVA RESPIMAT) 2.5  MCG/ACT AERS, Inhale 2 puffs into the lungs daily., Disp: 1 Inhaler, Rfl: 5    Review of Systems  Constitutional: Negative for activity change, appetite change, chills, diaphoresis, fatigue, fever and unexpected weight change.  HENT: Negative for congestion, rhinorrhea, sinus pressure, sneezing, sore throat and trouble swallowing.   Eyes: Negative for photophobia and visual disturbance.  Respiratory: Positive for cough and shortness of breath. Negative for chest tightness, wheezing and stridor.   Cardiovascular: Negative for chest pain, palpitations and  leg swelling.  Gastrointestinal: Negative for abdominal distention, abdominal pain, anal bleeding, blood in stool, constipation, diarrhea, nausea and vomiting.  Genitourinary: Negative for difficulty urinating, dysuria, flank pain and hematuria.  Musculoskeletal: Negative for arthralgias, back pain, gait problem, joint swelling and myalgias.  Skin: Negative for color change, pallor and wound.  Neurological: Negative for dizziness, tremors, weakness and light-headedness.  Hematological: Negative for adenopathy. Does not bruise/bleed easily.  Psychiatric/Behavioral: Negative for agitation, behavioral problems, confusion, decreased concentration, dysphoric mood and sleep disturbance.       Objective:   Physical Exam  Constitutional: He is oriented to person, place, and time. He appears well-nourished. No distress.  HENT:  Head: Normocephalic and atraumatic.  Eyes: Pupils are equal, round, and reactive to light. Conjunctivae and EOM are normal. No scleral icterus.  Neck: Normal range of motion. Neck supple.  Cardiovascular: Normal rate, regular rhythm and normal heart sounds. Exam reveals no gallop and no friction rub.  No murmur heard. Pulmonary/Chest: No respiratory distress. He has decreased breath sounds in the right lower field.  Prolonged expiratory phase  Abdominal: He exhibits no distension. There is no abdominal tenderness. There is  no rebound.  Musculoskeletal:        General: No tenderness or edema.  Lymphadenopathy:    He has no cervical adenopathy.  Neurological: He is alert and oriented to person, place, and time. He exhibits normal muscle tone. Coordination normal.  Skin: Skin is warm and dry. Rash noted. He is not diaphoretic. No erythema. No pallor.     Psychiatric: He has a normal mood and affect. His behavior is normal. Judgment and thought content normal.    Hyperpigmented area on neck present today and also photographed previously and picture 04/08/2017:         Assessment & Plan:   Aspergilloma: continue lifelong antifungal therapy: currently on CRESEMBA. Greatly appreciate [facility] pharmacy's help.  Allergic bronchopulmonary aspgergillosis: As above, I would be more comfortable with thim being on long standing antifungal therapy. He is still on steroids as well  Sun exposed rash from voriconozole: We changed to Cresemba and it seems improved to me  Mycobacterium avium pulmonary infection: We have been reluctant to stop these medications due to prior flares when he comes off the medication indications.   Hyperglycemia he was noted to have a blood sugar of 133 which he believes he was fasting: We will check an A1c to be sure

## 2019-08-06 NOTE — Telephone Encounter (Signed)
[facility] Patient Advocate Encounter  Prior Authorization for Cresemba capsules has been approved.    PA# [id number] Case: [id number] Effective dates: 08/06/2019 through 08/05/2020  Patients co-pay is $40.00  [facility] will continue to follow if anything else is needed with his prior authorization.   Bartholomew Crews, CPhT Specialty Pharmacy Patient [facility] Phone: [phone] Fax: [phone] 08/06/2019 9:51 AM

## 2019-08-07 LAB — HEMOGLOBIN A1C
Hgb A1c MFr Bld: 5.1 % of total Hgb (ref <5.7)
Mean Plasma Glucose: 100 (calc)
eAG (mmol/L): 5.5 (calc)

## 2019-08-13 DIAGNOSIS — R634 Abnormal weight loss: Secondary | ICD-10-CM | POA: Diagnosis not present

## 2019-08-13 DIAGNOSIS — R7301 Impaired fasting glucose: Secondary | ICD-10-CM | POA: Diagnosis not present

## 2019-08-25 ENCOUNTER — Telehealth: Payer: Self-pay | Admitting: Internal Medicine

## 2019-08-25 NOTE — Telephone Encounter (Signed)
ATC [facility] to schedule Fasenra shipment. Varney Baas prescription given. Was instructed by pharmacy to call back in 24 hours to allow time for processing new prescription and to schedule shipment.

## 2019-08-27 NOTE — Telephone Encounter (Signed)
Received a fax that pt is needing a new PA for Fasenra. This has been started on Cover My Meds. Key: [id number]. Will follow up.

## 2019-08-28 ENCOUNTER — Telehealth: Payer: Self-pay | Admitting: Internal Medicine

## 2019-08-28 NOTE — Telephone Encounter (Signed)
Will route to the injection pool to start a new prior authorization for fasenra.

## 2019-08-31 NOTE — Telephone Encounter (Signed)
Duplicate message. See encounter 08/25/2019.

## 2019-08-31 NOTE — Telephone Encounter (Signed)
Returned  call to [employer]. [phone], option #6, was on hold 15 min. Will try again at a later time.  Fasenra PA under media tab was approved for 04/02/19-04/01/2020.

## 2019-08-31 NOTE — Telephone Encounter (Signed)
Tiffany called regarding PA for [name] - she states the request for PA was to be back dated to 05/05/2019 - she states they do not back date further than 30 days - she can be reached at [phone] option 6-pr

## 2019-09-01 NOTE — Telephone Encounter (Signed)
[place], [phone].  PA initiated 08/27/19 was backdated to 04/2019.  PA can only be backdated 30 days.  Patient PA approval 04/02/19 to 04/01/2020 was not in current system.   Fasenra PA initiated for prefilled syringes. Per [employer], [facility]. PA approval or denial will be received via fax within 5 days.  Ref # [id number]. Patient made aware PA is pending.  Patient injection appointment cancelled.  Patient stated it is ok to leave message his VM once approval/denial has been received to schedule next appointment. Will follow up.

## 2019-09-02 ENCOUNTER — Ambulatory Visit: Payer: BC Managed Care – PPO

## 2019-09-04 NOTE — Telephone Encounter (Signed)
[name] PA approval from [place]  received via fax.  [name] is approved from 09/01/19 to 08/30/2020, through [place]. Patient was previously getting Engineer, mining through [employer]. Verbal prescription given to Elberta Fortis, Pharmacist for [facility] 28m/ml prefilled syringes, every 8 weeks, with refills. Patient aware FBerna Bueis approved. Patient scheduled injection appointment 09/30/19 at 1:30pm.

## 2019-09-08 ENCOUNTER — Telehealth: Payer: Self-pay | Admitting: Internal Medicine

## 2019-09-08 NOTE — Telephone Encounter (Signed)
Per telephone encounter 08/25/2019, [name] has been approved through 08/30/2020. We also have a fax from the pt's insurance stating the same thing.  Called [facility]. Was advised that their system is still stating that a PA is needed. Upon further review of the pt's PA approval letter, the PA has been approved under the pt's medical plan and NOT his prescription plan. This means the pt will be Buy and Bill. Pt's next injection appointment, his medication will need to be ordered from [facility]. This information has been updated in the pt's chart.

## 2019-09-08 NOTE — Telephone Encounter (Signed)
Routed message to injection pool.

## 2019-09-10 ENCOUNTER — Telehealth: Payer: Self-pay | Admitting: Pulmonary Disease

## 2019-09-10 NOTE — Telephone Encounter (Signed)
LMTCB.   Will route message to [employer] pool.

## 2019-09-11 ENCOUNTER — Telehealth: Payer: Self-pay | Admitting: Pulmonary Disease

## 2019-09-11 NOTE — Telephone Encounter (Signed)
Pt's PA was approved through his medical plan, not insurance plan. We no longer need a prescription from the pharmacy. They have been made aware of this already. Nothing further was needed.

## 2019-09-11 NOTE — Telephone Encounter (Signed)
Called [facility] once again to make them aware that we will not be using the pt's pharmacy benefit for his medication as his PA was approved through his medical benefit.

## 2019-09-15 ENCOUNTER — Telehealth: Payer: Self-pay | Admitting: Internal Medicine

## 2019-09-15 NOTE — Telephone Encounter (Signed)
FASENRA HAS BEEN APPROVED THROUGH PT'S MEDICAL PLAN. We will not be going through a specialty pharmacy for the pt's Fasenra. [facility] is aware of this information.

## 2019-09-16 MED FILL — CRESEMBA 186 MG CAPSULE: 186 | 28 days supply | Qty: 56 | Fill #1

## 2019-09-21 ENCOUNTER — Telehealth: Payer: Self-pay | Admitting: Internal Medicine

## 2019-09-21 NOTE — Telephone Encounter (Signed)
[name] Order: 54m #1 prefilled syringe Ordered date: 09/21/2019 Expected date of arrival: 09/22/2019 Ordered by: LDesmond Dike CLeighton [name]

## 2019-09-22 NOTE — Telephone Encounter (Signed)
[name] Shipment Received:  69m #1 prefilled syringe Medication arrival date: 09/22/2019 Lot #: [id number] date: 09/2020 Received by: LDesmond Dike CFridley

## 2019-09-28 ENCOUNTER — Telehealth: Payer: Self-pay | Admitting: Internal Medicine

## 2019-09-28 NOTE — Telephone Encounter (Signed)
Spoke with pt. Advised him that we have his medication on hand for his appointment on Wednesday. Nothing further was needed.

## 2019-09-30 ENCOUNTER — Other Ambulatory Visit: Payer: Self-pay

## 2019-09-30 ENCOUNTER — Ambulatory Visit (INDEPENDENT_AMBULATORY_CARE_PROVIDER_SITE_OTHER): Payer: BC Managed Care – PPO

## 2019-09-30 DIAGNOSIS — J4550 Severe persistent asthma, uncomplicated: Secondary | ICD-10-CM | POA: Diagnosis not present

## 2019-09-30 MED ORDER — BENRALIZUMAB 30 MG/ML ~~LOC~~ SOSY
30.0000 mg | PREFILLED_SYRINGE | Freq: Once | SUBCUTANEOUS | Status: AC
  Administered 2019-09-30: 30 mg via SUBCUTANEOUS

## 2019-09-30 NOTE — Progress Notes (Signed)
All questions were answered by the patient before medication was administered. Have you been hospitalized in the last 10 days? No Do you have a fever? No Do you have a cough? No Do you have a headache or sore throat? No  

## 2019-10-07 ENCOUNTER — Other Ambulatory Visit: Payer: Self-pay | Admitting: Internal Medicine

## 2019-10-08 ENCOUNTER — Telehealth: Payer: Self-pay | Admitting: Internal Medicine

## 2019-10-08 NOTE — Telephone Encounter (Signed)
Before we can refill pt's prednisone, pt needs to schedule follow up with MR. MR's first avail appt is on 11/30. Once pt has been scheduled for a follow up, we can refill his prednisone.  Attempted to call pt but unable to reach. Left message for him to return call.

## 2019-10-08 NOTE — Telephone Encounter (Signed)
Patient is requesting a refill of Prednisone 32m. Please advise if this can be refilled.

## 2019-10-09 MED ORDER — PREDNISONE 5 MG PO TABS: 5.0000 mg | ORAL_TABLET | Freq: Every day | ORAL | 0 refills | Status: DC

## 2019-10-09 NOTE — Telephone Encounter (Signed)
Spoke with pt, sent in prednisone refill as requested.  Nothing further needed at this time- will close encounter.

## 2019-10-09 NOTE — Telephone Encounter (Signed)
Patient called back -scheduled f/u on 10/28/2019 -pt can be reached at [phone]

## 2019-10-28 ENCOUNTER — Encounter: Payer: Self-pay | Admitting: Internal Medicine

## 2019-10-28 ENCOUNTER — Ambulatory Visit: Payer: BC Managed Care – PPO | Admitting: Internal Medicine

## 2019-10-28 ENCOUNTER — Other Ambulatory Visit: Payer: Self-pay

## 2019-10-28 VITALS — BP 108/62 | HR 68 | Ht 68.0 in | Wt 129.0 lb

## 2019-10-28 DIAGNOSIS — R768 Other specified abnormal immunological findings in serum: Secondary | ICD-10-CM

## 2019-10-28 DIAGNOSIS — J4550 Severe persistent asthma, uncomplicated: Secondary | ICD-10-CM | POA: Diagnosis not present

## 2019-10-28 DIAGNOSIS — A310 Pulmonary mycobacterial infection: Secondary | ICD-10-CM

## 2019-10-28 DIAGNOSIS — B440 Invasive pulmonary aspergillosis: Secondary | ICD-10-CM | POA: Diagnosis not present

## 2019-10-28 DIAGNOSIS — B4481 Allergic bronchopulmonary aspergillosis: Secondary | ICD-10-CM | POA: Diagnosis not present

## 2019-10-28 MED ORDER — BREO ELLIPTA 100-25 MCG/INH IN AEPB: 1.0000 | INHALATION_SPRAY | Freq: Every day | RESPIRATORY_TRACT | 5 refills | Status: DC

## 2019-10-28 NOTE — Progress Notes (Signed)
male never smoker followed for ABPA , MAC and Aspergilloma .  Patient has a history of aspergilloma , status post VATS in 2012. 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 Followed at [facility] for MAC .    TEST  1.7/13 Ig E 800s 12/10/11 Sputum AFB  Smear and culture negative (prelim) 12/10/11 Fungal sputum culture - negative (final) 01/30/2012 spirometry - fev1 1.7L/41%, ratip 52 - BEST EVER 01/30/2012 walking desaturation test: 185 feet x 3 laps: did not deaturate 05/2014 >Spiro fev1 1.7L/40%, ratio 55  Spirometry 06/25/2017 showed FEV1 at 28%, ratio 46, FVC 50%.  Events  2010 Dx with MAI >Azithro/Ethambutol 2012 Dx with Aspergilloma >VATS >started on Voriconazaol  2012 Dx w/ ABPA w/ High IgE >4000>started on prednisone  2013 tried off azithr/Etham but developed fever, Cx neg but restarted on rx.  2016 Hospitalzed with hemoptysis >IR guided embolization . FOB/BAL + Saccharomyces on fungal fx . Serum Galactomannan was neg.  [name] evaluation 04/2017 for Transplant -rejected. Felt to early for transplant - recommended pre-transplant weight goal 128 lbs for a BMI > 19          HPI   OV 01/16/2016  Chief Complaint  Patient presents with  . Follow-up    Pt denies any increased dyspnea at this time - states that it comes and goes with activity. Pt states it is different on a daily basis. Denies hemoptysis since last OV.   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 Since then he overall feels that his dyspnea is not back to baseline. He feels he has had a new lower baseline. He has more fatigued than usual. But he is stable without any exacerbation. He feels in the next few years ago started to become permanently disabled. He currently works as a cBiomedical scientist There are no fevers.   Last chest x-ray 11/07/2015: Shows hyperinflation personally  visualized Last IgE January 2013 was 841 and significantly improved from 4000 2012.  Last CBC January 2017 with normal used to flows are 100 cells per cubic millimeter.   OV 08/27/2016  Chief Complaint  Patient presents with  . Follow-up    Pt states his cough and SOB is at baseline. Pt states he has hemoptysis around once a week at most - penny size of mucus. Pt denies CP/tightness and f/c/s.      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 He status post IR guided embolization of hemoptysis end 2016.   Last visit feb 2017 he was reporting worsening dyspnea. We checked his IgE. Improved significantly but it was still in the 700s. We started Xolair therapy. He's been doing this now for a few months. He says this has helped his dyspnea symptoms significantly. However at end of 2 weeks he starts getting more symptomatic. Overall he is stable. 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 There are no fevers. Here, function test today FEV1 is 1.65 L/42% postbronchodilator. This is similar to 2014 but improved compared to 2015. Off note he tells me that he is mild hemoptysis once a month this is stable. He says this is actually been going on since November 2016 when he had his embolization. This no change in this. He knows that he has to monitor his  hemoptysis volume. He will have his flu shot today  Last chest x-ray 11/07/2015: Shows hyperinflation personally visualized Last IgE FEb 2017 was 717 and started xolair (January 2013 was 841 and significantly improved from 4000 in  2012)   OV 03/18/2017  Chief Complaint  Patient presents with  . Follow-up    6 mo f/u. Breathing has been the same since the last visit. Denies any increased SOB or chest pains.    Follow-up severe persistent asthma with ABPA and aspergilloma with MAI infection. He status post IR guided embolization for hemoptysis and end of 2016    Overall he has done well. However he says that at his job at [employer] they have been working hard and he is mostly fatigue with very closely. Recently been only sleeping 3 hours per day because of the work. Then in mid April 2018 he have one episode of hemoptysis while at work. With small amount that lasted few to several minutes. It did scare him but he decided to hold off against going to the emergency department. Since then he's not had any recurrence. He feels stressed and job stress is contributing to this. Most recent CT scan chest as in December 2017 that shows stability documented below. There are no other new issues. He is open to having a transplant referral.   OV 01/27/2018  Chief Complaint  Patient presents with  . Follow-up    Pt states he has been doing good. Denies any complaints or concerns.     Follow-up severe persistent asthma with ABPA on prednisone 52m daily, spiriva, breo and Aspergilloma with MAI infection. He status post IR guided embolization of hemoptysis end 2016 and on Xolair since early 2017   Overall doing well. 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 Symptoms score is low ; 12 CAT score. Compliant with Rx. No hemoptysis. Reviewed prior labs - he says on daily prednisone since 2012. And prior to that has had significant eosinophilia and also break through eos since then (see below). Unable to come off prednisone completely. XKathreen Devoidhas helped but not fully. 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 , MRN: [id number], ADDRESS: [address] [place] [id number]  12/17/2018 -   Chief Complaint  Patient presents with  . Follow-up    Pt states he was in the hosp Oct and Nov 2019. Pt states he is still not back to his self but is taking one day  at a time. Due to the recent hospitalization is why pt is here for today's appt.     Follow-up severe persistent asthma with ABPA on prednisone 544mdaily, spiriva, breo and Aspergilloma with MAI infection. He status post IR guided embolization of hemoptysis end 2016 and on Xolair since early 2017 bu tstopped since spring 2019 due to insurance issues    HPI KeCALVYN KURTZMAN89.o. -returns for follow-up.  I last saw him in the spring 2019.  After that in September 2019 he saw my nurse practitioner.  Then mid October 2019 he was admitted with acute right-sided tension pneumothorax [personally visualized the CT chest and confirmed the findings today].  After that he had a 5-week hospital stay.  This included failed chest tube management and pleurodesis.  Finally  he required endobronchial valves by Dr. Roxan Hockey.  These were also finally removed a few weeks ago.  He saw Dr. Roxan Hockey yesterday.  Chest x-ray follow-up yesterday that I visualized the lungs look expanded.  He is also seen [name] of infectious diseases yesterday and is been asked to continue his MAC and ABPA antifungal therapy.  I did notice that he is not taking Xolair anymore since the spring 2019.  He says is due to insurance issues.  Today he tells me that overall with all these admissions he has been left completely fatigued, lost weight and worn out.  He is now worried about his life expectancy.  At the same time he wants to go on work.  He wants to know if he should apply for disability.  We have had these conversations in the past.  We discussed his life philosophy.  He tells me that if he did not work he would feel that he is lazy and it is not compatible with this personal value system.  I did indicate to him that this is to be completely respected but his medical condition is that he would qualify for disability.  Therefore it is a personal choice he will have to make.  He is applied for Social Security disability through the  hospital social worker in November 2019.  The decision is pending any time now.  He is frustrated that is not able to work 80 hours a day.  He is wondering if he can even work 40 hours a day.  I have only advised him that he should look at complete disability.  If at all he wants to work he can look at a desk job.  Definitely should avoid jobs that heavy workload in the kitchen that involve carrying things or even sneezing or even doing pulmonary function test anymore because given his propensity for pneumothorax any rise in intrathoracic pressure would put him at risk for pneumothorax or other complications.  Other than this in terms of his lung health he is feeling stable at this point.  He is not taking Xolair he stopped this in the spring 2019.  This means is continued IgE destruction of the lungs.  We discussed this and he is willing to look into restarting this  Of note, he also wants me to check his blood work for [name].  I reviewed Dr. Arlyss Queen note.  This is indeed correct because yesterday pick medical record system was down.    OV 10/28/2019  Subjective:  Patient ID: [name], male , DOB: [birth year] , age 43 y.o. , MRN: [id number] , ADDRESS: [address] [place] [address]   10/28/2019 -   Chief Complaint  Patient presents with  . Follow-up    Pt states he has been doing okay since last visit. States he has good and bad days with his breathing and also has an occ cough.      Follow-up severe persistent asthma with ABPA on prednisone 33m daily, spiriva, breo and Aspergilloma with MAI infection. He status post IR guided embolization of hemoptysis end 2016 and on Xolair since early 2017 bu tstopped since spring 2019 due to insurance issues. FASENRA since April/May 2020  HPI KBRINLEY TREANOR89y.o. -routine follow-up last seen in January 2020.  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  He will not lift more than 15 pounds because of his pneumothorax risk and dyspnea.  He is happy now that he has employed based insurance.  He did apply for disability but did not qualify because he did not desaturate with a 6-minute walk test.  This is despite him having severe obstructive lung disease.  He says he is reconciled and he likes to work because it gives him purpose.  He has had his flu shot.  He is now taking [name] since spring 2020 and this seems to be working well for him.  He takes it every 8 weeks.  He has not had any exacerbations.  He prefers to avoid pulmonary function test because of the pneumothorax risk.  There are no other new issues.     CAT Symptom & Quality of Life Score (GSK trademark) 0 is no burden. 5 is highest burden 01/27/2018   Never Cough -> Cough all the time 3  No phlegm in chest -> Chest is full of phlegm 2  No chest tightness -> Chest feels very tight 1  No dyspnea for 1 flight stairs/hill -> Very dyspneic for 1 flight of stairs 3  No limitations for ADL at home -> Very limited with ADL at home 1  Confident leaving home -> Not at all confident leaving home 0  Sleep soundly -> Do not sleep soundly because of lung condition 1  Lots of Energy -> No energy at all 1  TOTAL Score (max 40)  12    Results for ROBERTA, KELLY (MRN [id number]) as of 01/27/2018 09:12  Ref. Range 08/06/2016 09:09 06/25/2017 09:11  FEV1-Pre Latest Units: L 1.50 1.16  FEV1-%Pred-Pre Latest Units: % 39 28  Pre FEV1/FVC ratio Latest Units: % 53 46     Results for RUTGER, SALTON (MRN [id number]) as of 01/27/2018 09:12  Ref. Range 12/16/2010 18:25 12/17/2010 03:45 12/17/2010 12:38 12/18/2010 04:25 12/19/2010 04:00 12/20/2010 04:04 12/21/2010 03:37 12/22/2010 06:42 12/22/2010 15:22 12/23/2010 04:22 12/24/2010 03:30 12/25/2010 04:19 12/27/2010 04:00 03/05/2011 18:15 03/06/2011 09:08 03/07/2011 09:18 03/09/2011 05:10 03/11/2011 04:54  04/02/2011 09:41 05/28/2011 09:42 11/26/2011 17:14 12/10/2011 10:21 12/10/2011 10:21 03/13/2012 16:54 09/22/2012 10:46 11/23/2013 09:46 07/05/2014 09:58 02/16/2015 10:16 08/08/2015 10:04 10/13/2015 04:00 10/13/2015 04:20 10/14/2015 02:28 10/15/2015 04:44 12/05/2015 12:16 01/16/2016 16:16 04/09/2016 09:43 08/09/2017 10:48 08/10/2017 10:46  Eosinophils Absolute Latest Ref Range: 0.0 - 0.7 K/uL 0.3 0.1  0.4          0.0 0.0 0.1   0.1 0.0 0.0    0.1 0.1 0.1 0.1 0.0 0.1   0.1 0.1 0.0 196 0.0 0.0   Results for AYDDEN, CUMPIAN (MRN [id number]) as of 01/27/2018 09:12  Ref. Range 03/07/2011 09:18 03/08/2011 16:23 11/26/2011 17:14 01/16/2016 16:16 10/28/2019   IgE (Immunoglobulin E), Serum Latest Ref Range: <115 kU/L 4185.2 Result con... (H) 4744.6.Marland Kitchen. (H) 841.8 (H) 717 (H) 537      ROS - per HPI     has a past medical history of Anemia, 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[place]), Drug rash (12/16/2017), Dyspnea, Esophageal reflux, Hypothyroidism, Lung disease, bullous ([place]), MAI (mycobacterium avium-intracellulare) ([place]), Mold exposure (12/05/2015), Photosensitivity dermatitis (09/16/2017), Solar lentigo (08/08/2015), and Weight loss (10/15/2016).   reports that he quit smoking about 11 years ago. His smoking use included cigarettes. He has a 13.30 pack-year smoking history. He quit smokeless tobacco use about 24 years ago.  His smokeless tobacco use included snuff.  Past Surgical History:  Procedure  Laterality Date  . CHEST TUBE INSERTION Right 09/29/2018   Procedure: CHEST TUBE REPLACEMENT;  Surgeon: Melrose Nakayama, MD;  Location: [facility];  Service: Thoracic;  Laterality: Right;  . INSERTION OF IBV VALVE N/A 09/19/2018   Procedure: INSERTION OF INTERBRONCHIAL VALVE (IBV);  Surgeon: Melrose Nakayama, MD;  [facility];  Service: Thoracic;  Laterality: N/A;  . INSERTION OF IBV VALVE N/A 12/01/2018   Procedure: REMOVAL OF INTERBRONCHIAL VALVE (IBV);  Surgeon: Melrose Nakayama, MD;  Location: [facility] OR;  Service: Thoracic;  Laterality: N/A;  .  left VATS  2012   thoracotomy and LUL apical posterior segmentectomy  . STAPLING OF BLEBS Right 09/12/2018   Procedure: STAPLING OF BLEBS, right lower and middle lung lobes;  Surgeon: Melrose Nakayama, MD;  Location: [facility];  Service: Thoracic;  Laterality: Right;  Marland Kitchen VIDEO ASSISTED THORACOSCOPY Right 09/12/2018   Procedure: VIDEO ASSISTED THORACOSCOPY, right lung;  Surgeon: Melrose Nakayama, MD;  Location: [facility];  Service: Thoracic;  Laterality: Right;  Marland Kitchen VIDEO BRONCHOSCOPY Bilateral 10/20/2015   Procedure: VIDEO BRONCHOSCOPY WITHOUT FLUORO;  Surgeon: Marshell Garfinkel, MD;  Location: [facility];  Service: Cardiopulmonary;  Laterality: Bilateral;  . VIDEO BRONCHOSCOPY N/A 09/19/2018   Procedure: VIDEO BRONCHOSCOPY;  Surgeon: Melrose Nakayama, MD;  Location: [facility] OR;  Service: Thoracic;  Laterality: N/A;  . VIDEO BRONCHOSCOPY N/A 12/01/2018   Procedure: VIDEO BRONCHOSCOPY;  Surgeon: Melrose Nakayama, MD;  Location: [facility];  Service: Thoracic;  Laterality: N/A;  . VIDEO BRONCHOSCOPY WITH INSERTION OF INTERBRONCHIAL VALVE (IBV) N/A 09/29/2018   Procedure: VIDEO BRONCHOSCOPY WITH INSERTION OF INTERBRONCHIAL VALVE  (IBV);  Surgeon: Melrose Nakayama, MD;  Location: [facility] OR;  Service: Thoracic;  Laterality: N/A;    Allergies  Allergen Reactions  . Clarithromycin Other (See Comments)    Adverse reaction w/ voriconazole UNSPECIFIED REACTION   . Rifaximin Other (See Comments)    Adverse reaction w/ voriconazole UNSPECIFIED REACTION     Immunization History  Administered Date(s) Administered  . DTaP 11/19/2010  . Influenza Split 10/29/2011, 08/14/2012  . Influenza Whole 09/19/2010  . Influenza,inj,Quad PF,6+ Mos 11/23/2013, 09/28/2014, 09/06/2015, 08/27/2016, 08/10/2017, 09/09/2018, 08/06/2019  . Pneumococcal Conjugate-13 04/29/2017, 04/29/2017  . Pneumococcal Polysaccharide-23 12/17/2010    Family History  Adopted: Yes     Current Outpatient Medications:  .  albuterol  (VENTOLIN HFA) 108 (90 Base) MCG/ACT inhaler, Inhale 2 puffs into the lungs every 6 (six) hours as needed., Disp: 18 g, Rfl: 2 .  azithromycin (ZITHROMAX) 500 MG tablet, Take 1 tablet by mouth once daily, Disp: 30 tablet, Rfl: 11 .  Calcium Carbonate-Vitamin D 600-400 MG-UNIT tablet, Take 1 tablet by mouth daily. , Disp: , Rfl:  .  EPINEPHrine 0.3 mg/0.3 mL IJ SOAJ injection, Inject 0.3 mLs (0.3 mg total) into the muscle once for 1 dose., Disp: 1 Device, Rfl: 11 .  ethambutol (MYAMBUTOL) 400 MG tablet, TAKE 2& 1/2 TABLETS BY MOUTH ONCE DAILY, Disp: 598 tablet, Rfl: 5 .  feeding supplement, ENSURE ENLIVE, (ENSURE ENLIVE) LIQD, Take 237 mLs by mouth 3 (three) times daily between meals. (Patient taking differently: Take 237 mLs by mouth 2 (two) times daily between meals. ), Disp: 237 mL, Rfl: 12 .  fluticasone furoate-vilanterol (BREO ELLIPTA) 100-25 MCG/INH AEPB, Inhale 1 puff into the lungs daily., Disp: 30 each, Rfl: 5 .  guaiFENesin (MUCINEX) 600 MG 12 hr tablet, Take 1 tablet (600 mg total) by mouth 2 (two) times daily., Disp: 30 tablet, Rfl: 0 .  Isavuconazonium Sulfate (CRESEMBA) 186 MG CAPS, Take 2 capsules (372 mg total) by mouth daily., Disp: 60 capsule, Rfl: 5 .  levothyroxine (SYNTHROID, LEVOTHROID) 75 MCG tablet, Take 75 mcg by mouth daily before breakfast. , Disp: , Rfl: 0 .  megestrol (MEGACE) 20 MG tablet, Take 20 mg by mouth 3 (three) times daily., Disp: , Rfl:  .  predniSONE (DELTASONE) 5 MG tablet, Take 1 tablet (5 mg total) by mouth daily with breakfast., Disp: 90 tablet, Rfl: 0 .  Tiotropium Bromide Monohydrate (SPIRIVA RESPIMAT) 2.5 MCG/ACT AERS, Inhale 2 puffs into the lungs daily., Disp: 1 Inhaler, Rfl: 5      Objective:   Vitals:   10/28/19 1157  BP: 108/62  Pulse: 68  SpO2: 98%  Weight: 129 lb (58.5 kg)  Height: _0  (1.727 m)    Estimated body mass index is 19.61 kg/m as calculated from the following:   Height as of this encounter: _1  (1.727 m).   Weight as  of this encounter: 129 lb (58.5 kg).  _2 @  [employer]   10/28/19 1157  Weight: 129 lb (58.5 kg)     Physical Exam  General Appearance:    Alert, cooperative, no distress, appears stated age -yes, Deconditioned looking - no , OBESE  - no, Sitting on Wheelchair -  no  Head:    Normocephalic, without obvious abnormality, atraumatic  Eyes:    PERRL, conjunctiva/corneas clear,  Ears:    Normal TM's and external ear canals, both ears  Nose:   Nares normal, septum midline, mucosa normal, no drainage    or sinus tenderness. OXYGEN ON  - no . Patient is @ ra   Throat:   Lips, mucosa, and tongue normal; teeth and gums normal. Cyanosis on lips - no  Neck:   Supple, symmetrical, trachea midline, no adenopathy;    thyroid:  no enlargement/tenderness/nodules; no carotid   bruit or JVD  Back:     Symmetric, no curvature, ROM normal, no CVA tenderness  Lungs:     Distress - no , Wheeze no, Barrell Chest - no, Purse lip breathing - no, Crackles - no   Chest Wall:    No tenderness or deformity.    Heart:    Regular rate and rhythm, S1 and S2 normal, no rub   or gallop, Murmur - no  Breast Exam:    NOT DONE  Abdomen:     Soft, non-tender, bowel sounds active all four quadrants,    no masses, no organomegaly. Visceral obesity - no  Genitalia:   NOT DONE  Rectal:   NOT DONE  Extremities:   Extremities - normal, Has Cane - no, Clubbing - no, Edema - no  Pulses:   2+ and symmetric all extremities  Skin:   Stigmata of Connective Tissue Disease - no  Lymph nodes:   Cervical, supraclavicular, and axillary nodes normal  Psychiatric:  Neurologic:   Pleasant - yes, Anxious - no, Flat affect - no  CAm-ICU - neg, Alert and Oriented x 3 - yes, Moves all 4s - yes, Speech - normal, Cognition - intact           Assessment:       ICD-10-CM   1. Severe persistent asthma without complication  W73.71   2. 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 Elevated IgE level  R76.8   5. 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 Severe persistent asthma without complication  T11.73   2. ABPA (allergic bronchopulmonary aspergillosis) ([place])  B44.81   3. Invasive pulmonary aspergillosis (HCC)  B44.0   4. Elevated IgE level  R76.8   5. 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 Brand Males, M.D., F.C.C.P,  Pulmonary and Critical Care Medicine Staff Physician, [place] Director - Interstitial Lung Disease  Program  Pulmonary [place] [facility] [place], [place], [address]  Pager: [phone], If no answer or between  15:00h - 7:00h: call [phone] Telephone: [phone]  12:33 PM 10/28/2019

## 2019-10-28 NOTE — Patient Instructions (Addendum)
ICD-10-CM   1. Severe persistent asthma without complication  Z79.15   2. 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 Invasive pulmonary aspergillosis (HCC)  B44.0   4. Elevated IgE level  R76.8   5. 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

## 2019-11-05 MED FILL — CRESEMBA 186 MG CAPSULE: 186 | 28 days supply | Qty: 56 | Fill #2

## 2019-11-14 ENCOUNTER — Other Ambulatory Visit: Payer: Self-pay | Admitting: Internal Medicine

## 2019-11-16 ENCOUNTER — Telehealth: Payer: Self-pay | Admitting: Internal Medicine

## 2019-11-16 NOTE — Telephone Encounter (Signed)
[name] Order: 56m #1 prefilled syringe Ordered date: 11/16/19 Expected date of arrival: 11/17/19 Ordered by: LMaxton [name]

## 2019-11-17 NOTE — Telephone Encounter (Signed)
[name] Shipment Received:  48m #1 prefilled syringe Medication arrival date: 11/17/2019 Lot #: [id number] Exp date: 11/2020 Received by: LDesmond Dike CBroadway

## 2019-11-25 ENCOUNTER — Ambulatory Visit: Payer: BC Managed Care – PPO

## 2019-11-25 ENCOUNTER — Other Ambulatory Visit: Payer: Self-pay

## 2019-11-25 ENCOUNTER — Ambulatory Visit (INDEPENDENT_AMBULATORY_CARE_PROVIDER_SITE_OTHER): Payer: BC Managed Care – PPO

## 2019-11-25 DIAGNOSIS — J4550 Severe persistent asthma, uncomplicated: Secondary | ICD-10-CM | POA: Diagnosis not present

## 2019-11-25 MED ORDER — BENRALIZUMAB 30 MG/ML ~~LOC~~ SOSY
30.0000 mg | PREFILLED_SYRINGE | Freq: Once | SUBCUTANEOUS | Status: AC
  Administered 2019-11-25: 30 mg via SUBCUTANEOUS

## 2019-11-25 NOTE — Progress Notes (Signed)
All questions were answered by the patient before medication was administered. Have you been hospitalized in the last 10 days? No Do you have a fever? No Do you have a cough? No Do you have a headache or sore throat? No

## 2019-12-15 DIAGNOSIS — N6320 Unspecified lump in the left breast, unspecified quadrant: Secondary | ICD-10-CM | POA: Diagnosis not present

## 2019-12-28 MED FILL — CRESEMBA 186 MG CAPSULE: 186 | 28 days supply | Qty: 56 | Fill #3

## 2020-01-06 DIAGNOSIS — R922 Inconclusive mammogram: Secondary | ICD-10-CM | POA: Diagnosis not present

## 2020-01-06 DIAGNOSIS — R928 Other abnormal and inconclusive findings on diagnostic imaging of breast: Secondary | ICD-10-CM | POA: Diagnosis not present

## 2020-01-06 DIAGNOSIS — N6320 Unspecified lump in the left breast, unspecified quadrant: Secondary | ICD-10-CM | POA: Diagnosis not present

## 2020-01-06 DIAGNOSIS — N6489 Other specified disorders of breast: Secondary | ICD-10-CM | POA: Diagnosis not present

## 2020-01-25 ENCOUNTER — Telehealth: Payer: Self-pay | Admitting: Internal Medicine

## 2020-01-25 NOTE — Telephone Encounter (Signed)
[name] Order: 49m #1 prefilled syringe Ordered date: 01/25/2020 Expected date of arrival: 01/26/2020 Ordered by: [facility]Desmond [facility]ike [facility]: [name]

## 2020-01-26 NOTE — Telephone Encounter (Signed)
[name] Shipment Received:  79m #1 prefilled syringe Medication arrival date: 01/26/2020 Lot #: [id number] date: 02/2021 Received by: LDesmond Dike CSalinas

## 2020-02-01 MED FILL — CRESEMBA 186 MG CAPSULE: 186 | 28 days supply | Qty: 56 | Fill #4

## 2020-02-03 ENCOUNTER — Ambulatory Visit: Payer: BC Managed Care – PPO | Admitting: Infectious Disease

## 2020-02-03 ENCOUNTER — Ambulatory Visit (INDEPENDENT_AMBULATORY_CARE_PROVIDER_SITE_OTHER): Payer: BC Managed Care – PPO

## 2020-02-03 ENCOUNTER — Other Ambulatory Visit: Payer: Self-pay

## 2020-02-03 ENCOUNTER — Encounter: Payer: Self-pay | Admitting: Infectious Disease

## 2020-02-03 VITALS — BP 109/76 | HR 66 | Temp 98.0°F | Ht 68.0 in | Wt 132.0 lb

## 2020-02-03 DIAGNOSIS — J4550 Severe persistent asthma, uncomplicated: Secondary | ICD-10-CM | POA: Diagnosis not present

## 2020-02-03 DIAGNOSIS — B449 Aspergillosis, unspecified: Secondary | ICD-10-CM | POA: Diagnosis not present

## 2020-02-03 DIAGNOSIS — A310 Pulmonary mycobacterial infection: Secondary | ICD-10-CM | POA: Diagnosis not present

## 2020-02-03 DIAGNOSIS — B440 Invasive pulmonary aspergillosis: Secondary | ICD-10-CM | POA: Diagnosis not present

## 2020-02-03 MED ORDER — AZITHROMYCIN 500 MG PO TABS: 500.0000 mg | ORAL_TABLET | Freq: Every day | ORAL | 11 refills | Status: DC

## 2020-02-03 MED ORDER — BENRALIZUMAB 30 MG/ML ~~LOC~~ SOSY
30.0000 mg | PREFILLED_SYRINGE | Freq: Once | SUBCUTANEOUS | Status: AC
  Administered 2020-02-03: 30 mg via SUBCUTANEOUS

## 2020-02-03 MED ORDER — CRESEMBA 186 MG PO CAPS: 2.0000 | ORAL_CAPSULE | Freq: Every day | ORAL | 11 refills | Status: DC

## 2020-02-03 MED ORDER — ETHAMBUTOL HCL 400 MG PO TABS: ORAL_TABLET | ORAL | 5 refills | Status: DC

## 2020-02-03 NOTE — Progress Notes (Signed)
All questions were answered by the patient before medication was administered. Have you been hospitalized in the last 10 days? No Do you have a fever? No Do you have a cough? No Do you have a headache or sore throat? No  

## 2020-02-03 NOTE — Progress Notes (Signed)
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 We had taken him off his Mycobacterium avium drugs and then he developed fevers chills and malaise  Spring (2013) and was seen by my partner Dr. Megan Salon in late April  His AFB smears and cultures obtained in the clinic on that day prior to the initiation of azithromycin ethambutol ultimately failed to grow Mycobacterium avium. He had been doing well I saw him in [facility] 2013 we discontinued his azithromycin and ethambutol again. Unfortunately he again shortly thereafter developed fevers cough malaise. He started back on azithromycin ethambutol. Cultures done for AFB here were again negative. The patient however felt dramatically better having been back on azithromycin and ethambutol.   I saw him in January of 2015 when he was feeling relatively well.  He did have an episode of coughing up a few flecks of blood in January 2016 which prompted appt with [facility] but by time he made appt this had long since resolved. Was only a one time episode none since then.  I saw again in March and then in September 2016. In summer he was hospitalized with hemoptysis and found to have an area in the upper lobe consistent with an aspergilloma. He underwent bronchoscopy with BAL and cultures ultimately yielded a Saccharomyces species on fungal culture. His serum galactomannan was negative.  He has been  on  voriconazole and axial has had radiographic improvement and his x-ray findings. He also feelt etter symptomatically with no more hemoptysis improvement in his cough improvement in his energy though he still feels not nearly as well as he did prior to his hospitalization. He is avoiding exercised during the winter as it often precipitates coughing spells. He was  only taking 200 mg twice a day of voriconazole rather than the 300 mg he has continued this along with his ethambutol and azithromycin. He continued to have cough more so with exercise and has not been exercising at "[employer]" due to the fact this elicits more coughing. He has not smoked since 2007. He is seeing [name] and is being considered for new drug infusion to treat his COPD.  He did have 20# weight loss that he attributed to increased exercise.  He then saw  [name] after an episode of hematemesis. 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 He said he only coughed up a small amount of blood material and did not go to the ER but was seen and followed by pulmonary. Had a chest x-ray which showed no new findings.  He was then  going to be evaluated by the transplant team at [facility].  He did complain of hyperpigmented spots on sun exposed areas on his body including arms and neck which his dermatologist have told him is due to his voriconazole use.   We considered change at that time  Since that time he has [place] been admitted with hemoptysis at [facility].   Repeat CT lungs  showed on 08/09/17:  Large emphysematous bulla are noted in both upper lobes. There is interval development of fluid density in several bulla in the left upper lobe concerning for superimposed infection, possibly fungal in origin.  Stable bilateral calcified pulmonary nodules are noted consistent with prior granulomatous disease.  Stable tree in bud appearance is noted medially in the left lung posteriorly  consistent with sequela of previous atypical infection.   Due to his skin toxicity we decided to change him to [facility] and he is on that now .  And has done well.  Since I last saw [place] he has had multiple problems related to pneumothoraces.  This required placement of an intrabronchial valve in November 2019.  This resolved but then he had to be taken back to the operating room in early January where the intrabronchial valves were removed.  Was seen at [facility] medical transplant team as well but felt not yet to be a transplant candidate due to the fact that his lungs were not in severe enough condition to warrant transplantation  He had applied for disability but this has not yet gone through.  Certainly he deserves to have disability and he has significant medical problems.  He did however eventually go back to work at [employer] recently due to his need for insurance with a high [employer] coverage payment of $700 per month.  He is been receiving his antimycobacterial drugs and is [place].  He tells me that since he changed his job to 1 he does not exert himself as much he feels he is doing better and at less risk of getting an pneumothorax.  Fortunately has not had another pneumothorax since 2019.  His cough is stable and seems improved compared to when I saw him last.  He follows closely with [name]  Past Medical History:  Diagnosis Date  . Anemia   . 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[place])   . Drug rash 12/16/2017  . Dyspnea   . Esophageal reflux    very rare  . 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 Lung disease, bullous ([place])   . MAI (mycobacterium avium-intracellulare) ([place])   . Mold exposure 12/05/2015  . 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 Solar lentigo 08/08/2015  . Weight loss 10/15/2016   Past Surgical History:  Procedure Laterality Date  . CHEST TUBE INSERTION Right 09/29/2018   Procedure: CHEST TUBE REPLACEMENT;  Surgeon: Melrose Nakayama,  MD;  Location: [place];  Service: Thoracic;  Laterality: Right;  . INSERTION OF IBV VALVE N/A 09/19/2018   Procedure: INSERTION OF INTERBRONCHIAL VALVE (IBV);  Surgeon: Melrose Nakayama, MD;  Location: [facility] OR;  Service: Thoracic;  Laterality: N/A;  . INSERTION OF IBV VALVE N/A 12/01/2018   Procedure: REMOVAL OF INTERBRONCHIAL VALVE (IBV);  Surgeon: Melrose Nakayama, MD;  Location: [facility] OR;  Service: Thoracic;  Laterality: N/A;  . left VATS  2012   thoracotomy and LUL apical posterior segmentectomy  . STAPLING OF BLEBS Right 09/12/2018   Procedure: STAPLING OF BLEBS, right lower and middle lung lobes;  Surgeon: Melrose Nakayama, MD;  Location: [place];  Service: Thoracic;  Laterality: Right;  Marland Kitchen VIDEO ASSISTED THORACOSCOPY Right 09/12/2018   Procedure: VIDEO ASSISTED THORACOSCOPY, right lung;  Surgeon: Melrose Nakayama, MD;  Location: [place];  Service: Thoracic;  Laterality: Right;  Marland Kitchen VIDEO BRONCHOSCOPY Bilateral 10/20/2015   Procedure: VIDEO BRONCHOSCOPY WITHOUT FLUORO;  Surgeon: Marshell Garfinkel, MD;  Location: [place];  Service: Cardiopulmonary;  Laterality: Bilateral;  . VIDEO BRONCHOSCOPY N/A 09/19/2018   Procedure: VIDEO BRONCHOSCOPY;  Surgeon: Melrose Nakayama, MD;  Location: [facility] OR;  Service: Thoracic;  Laterality: N/A;  . VIDEO BRONCHOSCOPY N/A 12/01/2018   Procedure: VIDEO BRONCHOSCOPY;  Surgeon: Melrose Nakayama, MD;  Location: [place];  Service: Thoracic;  Laterality: N/A;  . VIDEO BRONCHOSCOPY WITH INSERTION OF INTERBRONCHIAL VALVE (IBV) N/A 09/29/2018   Procedure: VIDEO BRONCHOSCOPY WITH INSERTION OF INTERBRONCHIAL VALVE  (IBV);  Surgeon: Melrose Nakayama, MD;  Location: [facility] OR;  Service: Thoracic;  Laterality: N/A;   Family History  Adopted: Yes   Social History   Tobacco Use  . Smoking status: Former Smoker    Packs/day: 0.70    Years: 19.00    Pack years: 13.30    Types: Cigarettes    Quit date: 11/20/2007    Years since quitting: 12.2  . Smokeless  tobacco: Former Systems developer    Types: Snuff    Quit date: 11/19/1994  Substance Use Topics  . Alcohol use: No  . Drug use: No    Allergies  Allergen Reactions  . Clarithromycin Other (See Comments)    Adverse reaction w/ voriconazole UNSPECIFIED REACTION   . Rifaximin Other (See Comments)    Adverse reaction w/ voriconazole UNSPECIFIED REACTION     Current Outpatient Medications:  .  albuterol (VENTOLIN HFA) 108 (90 Base) MCG/ACT inhaler, Inhale 2 puffs into the lungs every 6 (six) hours as needed., Disp: 18 g, Rfl: 2 .  Calcium Carbonate-Vitamin D 600-400 MG-UNIT tablet, Take 1 tablet by mouth daily. , Disp: , Rfl:  .  EPINEPHrine 0.3 mg/0.3 mL IJ SOAJ injection, Inject 0.3 mLs (0.3 mg total) into the muscle once for 1 dose., Disp: 1 Device, Rfl: 11 .  ethambutol (MYAMBUTOL) 400 MG tablet, TAKE 2& 1/2 TABLETS BY MOUTH ONCE DAILY, Disp: 598 tablet, Rfl: 5 .  feeding supplement, ENSURE ENLIVE, (ENSURE ENLIVE) LIQD, Take 237 mLs by mouth 3 (three) times daily between meals. (Patient taking differently: Take 237 mLs by mouth 2 (two) times daily between meals. ), Disp: 237 mL, Rfl: 12 .  fluticasone furoate-vilanterol (BREO ELLIPTA) 100-25 MCG/INH AEPB, Inhale 1 puff into the lungs daily., Disp: 30 each, Rfl: 5 .  guaiFENesin (MUCINEX) 600 MG 12 hr tablet, Take 1 tablet (600 mg total) by mouth 2 (two) times daily., Disp: 30 tablet, Rfl: 0 .  Isavuconazonium Sulfate (CRESEMBA) 186 MG CAPS, Take 2 capsules (372 mg total) by mouth daily., Disp: 60 capsule, Rfl: 5 .  levothyroxine (SYNTHROID, LEVOTHROID) 75 MCG tablet, Take 75 mcg by mouth daily before breakfast. , Disp: , Rfl: 0 .  megestrol (MEGACE) 20 MG tablet, Take 20 mg by mouth 3 (three) times daily., Disp: , Rfl:  .  predniSONE (DELTASONE) 5 MG tablet, TAKE 1 TABLET BY MOUTH ONCE DAILY WITH BREAKFAST .  NEED  OFFICE  VISIT  FOR  REFILLS, Disp: 90 tablet, Rfl: 0 .  predniSONE (DELTASONE) 5 MG tablet, Take 1 tablet (5 mg total) by mouth daily with  breakfast., Disp: 90 tablet, Rfl: 0 .  SPIRIVA RESPIMAT 2.5 MCG/ACT AERS, INHALE 2 SPRAY(S) BY MOUTH ONCE DAILY, Disp: 4 g, Rfl: 5 .  azithromycin (ZITHROMAX) 500 MG tablet, Take 1 tablet by mouth once daily (Patient not taking: Reported on 02/03/2020), Disp: 30 tablet, Rfl: 11    Review of Systems  Constitutional: Negative for activity change, appetite change, chills, diaphoresis, fatigue, fever and unexpected weight change.  HENT: Negative for congestion, rhinorrhea,  sinus pressure, sneezing, sore throat and trouble swallowing.   Eyes: Negative for photophobia and visual disturbance.  Respiratory: Positive for cough and shortness of breath. Negative for chest tightness, wheezing and stridor.   Cardiovascular: Negative for chest pain, palpitations and leg swelling.  Gastrointestinal: Negative for abdominal distention, abdominal pain, anal bleeding, blood in stool, constipation, diarrhea, nausea and vomiting.  Genitourinary: Negative for difficulty urinating, dysuria, flank pain and hematuria.  Musculoskeletal: Negative for arthralgias, back pain, gait problem, joint swelling and myalgias.  Skin: Negative for color change, pallor and wound.  Neurological: Negative for dizziness, tremors, weakness and light-headedness.  Hematological: Negative for adenopathy. Does not bruise/bleed easily.  Psychiatric/Behavioral: Negative for agitation, behavioral problems, confusion, decreased concentration, dysphoric mood and sleep disturbance.       Objective:   Physical Exam  Constitutional: He is oriented to person, place, and time. He appears well-nourished. No distress.  HENT:  Head: Normocephalic and atraumatic.  Eyes: Pupils are equal, round, and reactive to light. Conjunctivae and EOM are normal. No scleral icterus.  Cardiovascular: Normal rate, regular rhythm and normal heart sounds. Exam reveals no gallop and no friction rub.  No murmur heard. Pulmonary/Chest: No respiratory distress. He has  decreased breath sounds in the right lower field.  Prolonged expiratory phase  Abdominal: He exhibits no distension. There is no abdominal tenderness. There is no rebound.  Musculoskeletal:        General: No tenderness or edema.     Cervical back: Normal range of motion and neck supple.  Lymphadenopathy:    He has no cervical adenopathy.  Neurological: He is alert and oriented to person, place, and time. He exhibits normal muscle tone. Coordination normal.  Skin: Skin is warm and dry. He is not diaphoretic. No erythema. No pallor.     Psychiatric: He has a normal mood and affect. His behavior is normal. Judgment and thought content normal.         Assessment & Plan:   Aspergilloma: continue lifelong antifungal therapy: currently on CRESEMBA. Greatly appreciate [facility]'s help.  Allergic bronchopulmonary aspgergillosis: As above, I would be more comfortable with thim being on long standing antifungal therapy.   Sun exposed rash from voriconozole: We changed to Cresemba and it seems improved to me  Mycobacterium avium pulmonary infection: We have been reluctant to stop these medications due to prior flares when he comes off the medication indications.   I have strongly recommended that he get the COVID-19 vaccine and I have given him the telephone number for the [facility] and small were understand should have an appointment via phone or if he drives there in person.

## 2020-03-21 MED FILL — CRESEMBA 186 MG CAPSULE: 186 | 28 days supply | Qty: 56 | Fill #5

## 2020-04-06 ENCOUNTER — Telehealth: Payer: Self-pay | Admitting: Internal Medicine

## 2020-04-06 ENCOUNTER — Ambulatory Visit: Payer: BC Managed Care – PPO

## 2020-04-06 NOTE — Telephone Encounter (Signed)
LMTCB x1 for pt.  

## 2020-04-06 NOTE — Telephone Encounter (Signed)
Pt has been rescheduled to 04/13/20.

## 2020-04-13 ENCOUNTER — Ambulatory Visit (INDEPENDENT_AMBULATORY_CARE_PROVIDER_SITE_OTHER): Payer: BC Managed Care – PPO

## 2020-04-13 ENCOUNTER — Other Ambulatory Visit: Payer: Self-pay

## 2020-04-13 DIAGNOSIS — J4550 Severe persistent asthma, uncomplicated: Secondary | ICD-10-CM | POA: Diagnosis not present

## 2020-04-13 MED ORDER — BENRALIZUMAB 30 MG/ML ~~LOC~~ SOSY
30.0000 mg | PREFILLED_SYRINGE | Freq: Once | SUBCUTANEOUS | Status: AC
  Administered 2020-04-13: 30 mg via SUBCUTANEOUS

## 2020-04-13 NOTE — Progress Notes (Signed)
All questions were answered by the patient before medication was administered. Have you been hospitalized in the last 10 days? No Do you have a fever? No Do you have a cough? No Do you have a headache or sore throat? No  

## 2020-04-18 ENCOUNTER — Other Ambulatory Visit: Payer: Self-pay | Admitting: Internal Medicine

## 2020-04-19 ENCOUNTER — Other Ambulatory Visit: Payer: Self-pay | Admitting: Infectious Disease

## 2020-04-19 DIAGNOSIS — B449 Aspergillosis, unspecified: Secondary | ICD-10-CM

## 2020-04-19 DIAGNOSIS — A310 Pulmonary mycobacterial infection: Secondary | ICD-10-CM

## 2020-04-19 MED FILL — CRESEMBA 186 MG CAPSULE: 186 | 28 days supply | Qty: 56 | Fill #0

## 2020-05-30 ENCOUNTER — Telehealth: Payer: Self-pay | Admitting: Internal Medicine

## 2020-05-30 NOTE — Telephone Encounter (Signed)
[name] Order: 34m #1 prefilled syringe Ordered date: 05/30/2020 Expected date of arrival: 05/31/2020 Ordered by: LDesmond Dike [name]

## 2020-05-31 NOTE — Telephone Encounter (Signed)
[name] Shipment Received:  41m #1 prefilled syringe Medication arrival date: 05/31/2020 Lot #: [id number] Exp date: 07/2021 Received by: LDesmond Dike CLemay

## 2020-06-02 MED FILL — CRESEMBA 186 MG CAPSULE: 186 | 28 days supply | Qty: 56 | Fill #1

## 2020-06-04 ENCOUNTER — Inpatient Hospital Stay (HOSPITAL_COMMUNITY)
Admission: EM | Admit: 2020-06-04 | Discharge: 2020-06-07 | DRG: 194 | Disposition: A | Discharge status: Home / Self Care | Payer: BC Managed Care – PPO | Attending: Internal Medicine | Admitting: Internal Medicine

## 2020-06-04 ENCOUNTER — Emergency Department (HOSPITAL_COMMUNITY): Payer: BC Managed Care – PPO

## 2020-06-04 DIAGNOSIS — Z20822 Contact with and (suspected) exposure to covid-19: Secondary | ICD-10-CM | POA: Diagnosis present

## 2020-06-04 DIAGNOSIS — K219 Gastro-esophageal reflux disease without esophagitis: Secondary | ICD-10-CM | POA: Diagnosis present

## 2020-06-04 DIAGNOSIS — A310 Pulmonary mycobacterial infection: Secondary | ICD-10-CM | POA: Diagnosis not present

## 2020-06-04 DIAGNOSIS — Z7951 Long term (current) use of inhaled steroids: Secondary | ICD-10-CM

## 2020-06-04 DIAGNOSIS — J189 Pneumonia, unspecified organism: Secondary | ICD-10-CM | POA: Diagnosis not present

## 2020-06-04 DIAGNOSIS — B449 Aspergillosis, unspecified: Secondary | ICD-10-CM | POA: Diagnosis present

## 2020-06-04 DIAGNOSIS — J439 Emphysema, unspecified: Secondary | ICD-10-CM | POA: Diagnosis present

## 2020-06-04 DIAGNOSIS — E030 Congenital hypothyroidism with diffuse goiter: Secondary | ICD-10-CM | POA: Diagnosis not present

## 2020-06-04 DIAGNOSIS — Z77120 Contact with and (suspected) exposure to mold (toxic): Secondary | ICD-10-CM

## 2020-06-04 DIAGNOSIS — Z87891 Personal history of nicotine dependence: Secondary | ICD-10-CM

## 2020-06-04 DIAGNOSIS — Z79890 Hormone replacement therapy (postmenopausal): Secondary | ICD-10-CM

## 2020-06-04 DIAGNOSIS — B4481 Allergic bronchopulmonary aspergillosis: Secondary | ICD-10-CM | POA: Diagnosis not present

## 2020-06-04 DIAGNOSIS — B441 Other pulmonary aspergillosis: Secondary | ICD-10-CM | POA: Diagnosis not present

## 2020-06-04 DIAGNOSIS — Z7952 Long term (current) use of systemic steroids: Secondary | ICD-10-CM | POA: Diagnosis not present

## 2020-06-04 DIAGNOSIS — J4550 Severe persistent asthma, uncomplicated: Secondary | ICD-10-CM | POA: Diagnosis not present

## 2020-06-04 DIAGNOSIS — R0602 Shortness of breath: Secondary | ICD-10-CM | POA: Diagnosis not present

## 2020-06-04 DIAGNOSIS — Z79899 Other long term (current) drug therapy: Secondary | ICD-10-CM | POA: Diagnosis not present

## 2020-06-04 DIAGNOSIS — Z888 Allergy status to other drugs, medicaments and biological substances status: Secondary | ICD-10-CM

## 2020-06-04 DIAGNOSIS — R042 Hemoptysis: Secondary | ICD-10-CM | POA: Diagnosis present

## 2020-06-04 DIAGNOSIS — E039 Hypothyroidism, unspecified: Secondary | ICD-10-CM | POA: Diagnosis not present

## 2020-06-04 DIAGNOSIS — J90 Pleural effusion, not elsewhere classified: Secondary | ICD-10-CM | POA: Diagnosis not present

## 2020-06-04 LAB — BASIC METABOLIC PANEL
Anion gap: 12 (ref 5–15)
BUN: 13 mg/dL (ref 6–20)
CO2: 22 mmol/L (ref 22–32)
Calcium: 8.7 mg/dL — ABNORMAL LOW (ref 8.9–10.3)
Chloride: 100 mmol/L (ref 98–111)
Creatinine, Ser: 0.91 mg/dL (ref 0.61–1.24)
GFR calc Af Amer: >60 mL/min (ref >60)
GFR calc non Af Amer: >60 mL/min (ref >60)
Glucose, Bld: 124 mg/dL — ABNORMAL HIGH (ref 70–99)
Potassium: 4 mmol/L (ref 3.5–5.1)
Sodium: 134 mmol/L — ABNORMAL LOW (ref 135–145)

## 2020-06-04 LAB — CBC
HCT: 42.3 % (ref 39.0–52.0)
Hemoglobin: 14.3 g/dL (ref 13.0–17.0)
MCH: 31.5 pg (ref 26.0–34.0)
MCHC: 33.8 g/dL (ref 30.0–36.0)
MCV: 93.2 fL (ref 80.0–100.0)
Platelets: 245 10*3/uL (ref 150–400)
RBC: 4.54 MIL/uL (ref 4.22–5.81)
RDW: 13.5 % (ref 11.5–15.5)
WBC: 12.4 10*3/uL — ABNORMAL HIGH (ref 4.0–10.5)
nRBC: 0 % (ref 0.0–0.2)

## 2020-06-04 LAB — LACTIC ACID, PLASMA: Lactic Acid, Venous: 1.2 mmol/L (ref 0.5–1.9)

## 2020-06-04 LAB — TROPONIN I (HIGH SENSITIVITY)
Troponin I (High Sensitivity): 5 ng/L (ref <18)
Troponin I (High Sensitivity): 5 ng/L (ref <18)

## 2020-06-04 LAB — SARS CORONAVIRUS 2 BY RT PCR (HOSPITAL ORDER, PERFORMED IN ~~LOC~~ HOSPITAL LAB): SARS Coronavirus 2: NEGATIVE

## 2020-06-04 MED ORDER — LEVOFLOXACIN IN D5W 750 MG/150ML IV SOLN
750.0000 mg | Freq: Once | INTRAVENOUS | Status: AC
  Administered 2020-06-04: 750 mg via INTRAVENOUS
  Filled 2020-06-04: qty 150

## 2020-06-04 MED ORDER — SODIUM CHLORIDE 0.9 % IV BOLUS
1000.0000 mL | Freq: Once | INTRAVENOUS | Status: AC
  Administered 2020-06-04: 1000 mL via INTRAVENOUS

## 2020-06-04 MED ORDER — SODIUM CHLORIDE 0.9% FLUSH: 3.0000 mL | Freq: Once | INTRAVENOUS | Status: DC

## 2020-06-04 MED ORDER — ALBUTEROL SULFATE HFA 108 (90 BASE) MCG/ACT IN AERS
2.0000 | INHALATION_SPRAY | Freq: Once | RESPIRATORY_TRACT | Status: AC
  Administered 2020-06-04: 2 via RESPIRATORY_TRACT
  Filled 2020-06-04: qty 6.7

## 2020-06-04 MED ORDER — IOHEXOL 350 MG/ML SOLN
100.0000 mL | Freq: Once | INTRAVENOUS | Status: AC | PRN
  Administered 2020-06-04: 100 mL via INTRAVENOUS

## 2020-06-04 MED ORDER — METHYLPREDNISOLONE SODIUM SUCC 125 MG IJ SOLR
125.0000 mg | Freq: Once | INTRAMUSCULAR | Status: AC
  Administered 2020-06-04: 125 mg via INTRAVENOUS
  Filled 2020-06-04: qty 2

## 2020-06-04 NOTE — ED Provider Notes (Signed)
[facility] EMERGENCY DEPARTMENT Provider Note   CSN: [id number] Arrival date & time: 06/04/20  1708     History Chief Complaint  Patient presents with  . Hematemesis    [name] is a 44 y.o. male hx of aspergilloma, MAI, asthma, here with SOB, hematemasis.  Patient states that he has been spitting up blood for the last 2 weeks.  He states that initially it was small amount in the morning.  He states that he has been coughing up more blood in the last several days.  Patient denies any fevers and had Covid vaccine already.  Patient took his vitals this morning and his heart rate was 120 his oxygen was 80%.  Patient states that he was admitted previously for similar symptoms.  The history is provided by the patient.       Past Medical History:  Diagnosis Date  . Anemia   . 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[place])   . Drug rash 12/16/2017  . Dyspnea   . Esophageal reflux    very rare  . 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 Lung disease, bullous ([place])   . MAI (mycobacterium avium-intracellulare) ([place])   . Mold exposure 12/05/2015  . 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 Solar lentigo 08/08/2015  . Weight loss 10/15/2016    Patient Active Problem List   Diagnosis Date Noted  . Protein-calorie malnutrition, severe 09/05/2018  . Tension pneumothorax 09/04/2018  . 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 [facility]) 06/17/2018  . Drug rash 12/16/2017  . 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 GERD (gastroesophageal reflux disease) 08/09/2017  . Hypothyroidism 08/09/2017  . Iron deficiency anemia 08/09/2017  . Weight loss 10/15/2016  . Severe persistent asthma 01/16/2016  . Mold exposure 12/05/2015  . 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 Hemoptysis   . 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 Solar lentigo 08/08/2015  . Dyspnea 05/24/2014  . Osteoporosis, unspecified 03/10/2013  . Osteoporosis 09/22/2012  . Lung disease,  chronic obstructive ([place]) 01/30/2012  . Mycobacterium avium infection ([place]) 06/27/2011  . 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[place]) 03/27/2011  . 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 Mycobacterium avium complex ([place]) 12/28/2010    Past Surgical History:  Procedure Laterality Date  . CHEST TUBE INSERTION Right 09/29/2018   Procedure: CHEST TUBE REPLACEMENT;  Surgeon: Melrose Nakayama, MD;  Location: [place];  Service: Thoracic;  Laterality: Right;  . INSERTION OF IBV VALVE N/A 09/19/2018   Procedure: INSERTION OF INTERBRONCHIAL VALVE (IBV);  Surgeon: Melrose Nakayama, MD;  Location: [facility] OR;  Service: Thoracic;  Laterality: N/A;  . INSERTION OF IBV VALVE N/A 12/01/2018   Procedure: REMOVAL OF INTERBRONCHIAL VALVE (IBV);  Surgeon: Melrose Nakayama, MD;  Location: [facility] OR;  Service: Thoracic;  Laterality: N/A;  . left VATS  2012   thoracotomy and LUL apical posterior segmentectomy  . STAPLING OF BLEBS Right 09/12/2018   Procedure: STAPLING OF BLEBS, right lower and middle lung lobes;  Surgeon: Melrose Nakayama, MD;  Location: [place];  Service: Thoracic;  Laterality: Right;  Marland Kitchen VIDEO ASSISTED THORACOSCOPY Right 09/12/2018   Procedure: VIDEO ASSISTED THORACOSCOPY, right lung;  Surgeon: Melrose Nakayama, MD;  Location: [place];  Service: Thoracic;  Laterality: Right;  Marland Kitchen VIDEO BRONCHOSCOPY Bilateral 10/20/2015   Procedure: VIDEO BRONCHOSCOPY WITHOUT FLUORO;  Surgeon: Marshell Garfinkel, MD;  Location: [place];  Service: Cardiopulmonary;  Laterality: Bilateral;  . VIDEO BRONCHOSCOPY N/A 09/19/2018   Procedure: VIDEO BRONCHOSCOPY;  Surgeon: Modesto Charon  C, MD;  Location: [place];  Service: Thoracic;  Laterality: N/A;  . VIDEO BRONCHOSCOPY N/A 12/01/2018   Procedure: VIDEO BRONCHOSCOPY;  Surgeon: Melrose Nakayama, MD;  Location: [place];  Service: Thoracic;  Laterality: N/A;  . VIDEO BRONCHOSCOPY WITH INSERTION OF INTERBRONCHIAL VALVE (IBV) N/A 09/29/2018   Procedure:  VIDEO BRONCHOSCOPY WITH INSERTION OF INTERBRONCHIAL VALVE  (IBV);  Surgeon: Melrose Nakayama, MD;  Location: [facility] OR;  Service: Thoracic;  Laterality: N/A;       Family History  Adopted: Yes    Social History   Tobacco Use  . Smoking status: Former Smoker    Packs/day: 0.70    Years: 19.00    Pack years: 13.30    Types: Cigarettes    Quit date: 11/20/2007    Years since quitting: 12.5  . Smokeless tobacco: Former Systems developer    Types: Snuff    Quit date: 11/19/1994  Vaping Use  . Vaping Use: Never used  Substance Use Topics  . Alcohol use: No  . Drug use: No    Home Medications Prior to Admission medications   Medication Sig Start Date End Date Taking? Authorizing Provider  albuterol (VENTOLIN HFA) 108 (90 Base) MCG/ACT inhaler Inhale 2 puffs into the lungs every 6 (six) hours as needed. 07/24/19   Parrett, Fonnie Mu, NP  azithromycin (ZITHROMAX) 500 MG tablet Take 1 tablet (500 mg total) by mouth daily. 02/03/20   Truman Hayward, MD  Calcium Carbonate-Vitamin D 600-400 MG-UNIT tablet Take 1 tablet by mouth daily.     [provider]  CRESEMBA 186 MG CAPS TAKE 2 CAPSULES BY MOUTH DAILY 04/19/20   Tommy Medal, Lavell Islam, MD  EPINEPHrine 0.3 mg/0.3 mL IJ SOAJ injection Inject 0.3 mLs (0.3 mg total) into the muscle once for 1 dose. 04/30/19 07/23/20  Brand Males, MD  ethambutol (MYAMBUTOL) 400 MG tablet TAKE 2& 1/2 TABLETS BY MOUTH ONCE DAILY 02/03/20   Tommy Medal, Lavell Islam, MD  feeding supplement, ENSURE ENLIVE, (ENSURE ENLIVE) LIQD Take 237 mLs by mouth 3 (three) times daily between meals. Patient taking differently: Take 237 mLs by mouth 2 (two) times daily between meals.  10/06/18   Lavina Hamman, MD  fluticasone furoate-vilanterol (BREO ELLIPTA) 100-25 MCG/INH AEPB Inhale 1 puff into the lungs daily. 10/28/19   Brand Males, MD  guaiFENesin (MUCINEX) 600 MG 12 hr tablet Take 1 tablet (600 mg total) by mouth 2 (two) times daily. 10/06/18   Lavina Hamman, MD    levothyroxine (SYNTHROID, LEVOTHROID) 75 MCG tablet Take 75 mcg by mouth daily before breakfast.  09/27/14   [provider]  megestrol (MEGACE) 20 MG tablet Take 20 mg by mouth 3 (three) times daily.    [provider]  predniSONE (DELTASONE) 5 MG tablet Take 1 tablet (5 mg total) by mouth daily with breakfast. 10/09/19   Brand Males, MD  predniSONE (DELTASONE) 5 MG tablet TAKE 1 TABLET BY MOUTH ONCE DAILY WITH BREAKFAST .  NEED  OFFICE  VISIT  FOR  REFILLS. 04/19/20   Brand Males, MD  SPIRIVA RESPIMAT 2.5 MCG/ACT AERS INHALE 2 SPRAY(S) BY MOUTH ONCE DAILY 11/16/19   Brand Males, MD    Allergies    Clarithromycin and Rifaximin  Review of Systems   Review of Systems  Respiratory: Positive for cough.   All other systems reviewed and are negative.   Physical Exam Updated Vital Signs BP 108/67 (BP Location: Right Arm)   Pulse 87   Temp  98.7 F (37.1 C) (Oral)   Resp 20   Ht _0  (1.727 m)   Wt 58.5 kg   SpO2 97%   BMI 19.61 kg/m   Physical Exam Vitals and nursing note reviewed.  HENT:     Head: Normocephalic.     Nose: Nose normal.     Mouth/Throat:     Mouth: Mucous membranes are moist.  Eyes:     Extraocular Movements: Extraocular movements intact.     Pupils: Pupils are equal, round, and reactive to light.  Cardiovascular:     Rate and Rhythm: Normal rate and regular rhythm.     Pulses: Normal pulses.     Heart sounds: Normal heart sounds.  Pulmonary:     Comments: Crackles bilateral bases  Abdominal:     General: Abdomen is flat.     Palpations: Abdomen is soft.  Musculoskeletal:        General: Normal range of motion.     Cervical back: Normal range of motion and neck supple.  Skin:    General: Skin is warm.     Capillary Refill: Capillary refill takes less than 2 seconds.  Neurological:     General: No focal deficit present.     Mental Status: He is alert.  Psychiatric:        Mood and Affect: Mood normal.         Behavior: Behavior normal.     ED Results / Procedures / Treatments   Labs (all labs ordered are listed, but only abnormal results are displayed) Labs Reviewed  BASIC METABOLIC PANEL - Abnormal; Notable for the following components:      Result Value   Sodium 134 (*)    Glucose, Bld 124 (*)    Calcium 8.7 (*)    All other components within normal limits  CBC - Abnormal; Notable for the following components:   WBC 12.4 (*)    All other components within normal limits  CULTURE, BLOOD (ROUTINE X 2)  CULTURE, BLOOD (ROUTINE X 2)  SARS CORONAVIRUS 2 BY RT PCR (HOSPITAL ORDER, [place] [facility])  LACTIC ACID, PLASMA  LACTIC ACID, PLASMA  TROPONIN I (HIGH SENSITIVITY)  TROPONIN I (HIGH SENSITIVITY)    EKG EKG Interpretation  Date/Time:  Saturday June 04 2020 17:27:03 EDT Ventricular Rate:  91 PR Interval:  134 QRS Duration: 88 QT Interval:  352 QTC Calculation: 432 R Axis:   21 Text Interpretation: Normal sinus rhythm Normal ECG No significant change since last tracing Confirmed by Wandra Arthurs [phone]) on 06/04/2020 8:09:01 PM   Radiology DG Chest 2 View  Result Date: 06/04/2020 CLINICAL DATA:  Hemoptysis for 2 weeks. Decreased oxygen saturation. EXAM: CHEST - 2 VIEW COMPARISON:  PA and lateral chest 12/16/2018. FINDINGS: Severe bullous emphysema is again seen. There is a new focus of airspace disease in the lingula. Trace right pleural effusion is decreased compared to the prior exam. No pneumothorax. Heart size is normal. No acute bony abnormality. Remote midthoracic compression fracture noted. IMPRESSION: New focus of patchy airspace disease in the lingula worrisome for pneumonia superimposed on severe bullous emphysema. Electronically Signed   By: Inge Rise M.D.   On: 06/04/2020 17:46    Procedures Procedures (including critical care time)  Medications Ordered in ED Medications  sodium chloride flush (NS) 0.9 % injection 3 mL (has no  administration in time range)  levofloxacin (LEVAQUIN) IVPB 750 mg (has no administration in time range)  sodium chloride  0.9 % bolus 1,000 mL (has no administration in time range)  methylPREDNISolone sodium succinate (SOLU-MEDROL) 125 mg/2 mL injection 125 mg (has no administration in time range)  albuterol (VENTOLIN HFA) 108 (90 Base) MCG/ACT inhaler 2 puff (has no administration in time range)    ED Course  I have reviewed the triage vital signs and the nursing notes.  Pertinent labs & imaging results that were available during my care of the patient were reviewed by me and considered in my medical decision making (see chart for details).    MDM Rules/Calculators/A&P                          KILEY TORRENCE is a 44 y.o. male here presenting with hemoptysis.  Patient has a history of MAI and aspergilloma and asthma.  Patient was tachycardic and hypoxic at home.  However his vitals are stable in the ED.  Consider asthma versus pneumonia versus aspergilloma versus PE. Plan to get cbc, bmp, CTA chest, CXR.   11:04 PM Chest x-ray showed pneumonia.  White blood cell count is slightly elevated.  CTA chest showed uncomplicated emphysema in his disease as well as possible chronic clot and pulmonary hypertension and pneumonia. He is a complicated case.  She was given antibiotics empirically for CAP.  Hospitalist to admit and patient will likely need pulmonary consult and ID consult inpatient.  Final Clinical Impression(s) / ED Diagnoses Final diagnoses:  None    Rx / DC Orders ED Discharge Orders    None       Drenda Freeze, MD 06/04/20 2305

## 2020-06-04 NOTE — ED Notes (Signed)
Off floor for imaging

## 2020-06-04 NOTE — ED Triage Notes (Signed)
To triage via EMS.  Onset 2 weeks coughing up phlegm with blood.  Onset the past several days has been coughing up just blood, last time coughed up blood 10am today.   Pt was short of breath this morning, checked O2 sats and was in the 80's, HR 120 but was having coughing spell.  Pt states this happens every couple years and he has to be admitted to hospital.   EMS BP 128/88 HR 90 SpO2 97%

## 2020-06-05 ENCOUNTER — Encounter (HOSPITAL_COMMUNITY): Payer: Self-pay | Admitting: Family Medicine

## 2020-06-05 ENCOUNTER — Other Ambulatory Visit: Payer: Self-pay

## 2020-06-05 DIAGNOSIS — J439 Emphysema, unspecified: Secondary | ICD-10-CM | POA: Diagnosis present

## 2020-06-05 DIAGNOSIS — B4481 Allergic bronchopulmonary aspergillosis: Secondary | ICD-10-CM | POA: Diagnosis present

## 2020-06-05 DIAGNOSIS — E039 Hypothyroidism, unspecified: Secondary | ICD-10-CM

## 2020-06-05 DIAGNOSIS — Z79890 Hormone replacement therapy (postmenopausal): Secondary | ICD-10-CM | POA: Diagnosis not present

## 2020-06-05 DIAGNOSIS — B449 Aspergillosis, unspecified: Secondary | ICD-10-CM | POA: Diagnosis not present

## 2020-06-05 DIAGNOSIS — J189 Pneumonia, unspecified organism: Secondary | ICD-10-CM | POA: Diagnosis present

## 2020-06-05 DIAGNOSIS — A310 Pulmonary mycobacterial infection: Secondary | ICD-10-CM | POA: Diagnosis present

## 2020-06-05 DIAGNOSIS — R042 Hemoptysis: Secondary | ICD-10-CM | POA: Diagnosis present

## 2020-06-05 DIAGNOSIS — J4550 Severe persistent asthma, uncomplicated: Secondary | ICD-10-CM

## 2020-06-05 DIAGNOSIS — Z888 Allergy status to other drugs, medicaments and biological substances status: Secondary | ICD-10-CM | POA: Diagnosis not present

## 2020-06-05 DIAGNOSIS — Z79899 Other long term (current) drug therapy: Secondary | ICD-10-CM | POA: Diagnosis not present

## 2020-06-05 DIAGNOSIS — E030 Congenital hypothyroidism with diffuse goiter: Secondary | ICD-10-CM | POA: Diagnosis not present

## 2020-06-05 DIAGNOSIS — B441 Other pulmonary aspergillosis: Secondary | ICD-10-CM | POA: Diagnosis present

## 2020-06-05 DIAGNOSIS — Z20822 Contact with and (suspected) exposure to covid-19: Secondary | ICD-10-CM | POA: Diagnosis present

## 2020-06-05 DIAGNOSIS — Z7951 Long term (current) use of inhaled steroids: Secondary | ICD-10-CM | POA: Diagnosis not present

## 2020-06-05 DIAGNOSIS — K219 Gastro-esophageal reflux disease without esophagitis: Secondary | ICD-10-CM | POA: Diagnosis present

## 2020-06-05 DIAGNOSIS — Z77120 Contact with and (suspected) exposure to mold (toxic): Secondary | ICD-10-CM | POA: Diagnosis not present

## 2020-06-05 DIAGNOSIS — Z87891 Personal history of nicotine dependence: Secondary | ICD-10-CM | POA: Diagnosis not present

## 2020-06-05 DIAGNOSIS — Z7952 Long term (current) use of systemic steroids: Secondary | ICD-10-CM | POA: Diagnosis not present

## 2020-06-05 LAB — BASIC METABOLIC PANEL
Anion gap: 10 (ref 5–15)
BUN: 10 mg/dL (ref 6–20)
CO2: 21 mmol/L — ABNORMAL LOW (ref 22–32)
Calcium: 8.4 mg/dL — ABNORMAL LOW (ref 8.9–10.3)
Chloride: 103 mmol/L (ref 98–111)
Creatinine, Ser: 0.72 mg/dL (ref 0.61–1.24)
GFR calc Af Amer: >60 mL/min (ref >60)
GFR calc non Af Amer: >60 mL/min (ref >60)
Glucose, Bld: 155 mg/dL — ABNORMAL HIGH (ref 70–99)
Potassium: 3.9 mmol/L (ref 3.5–5.1)
Sodium: 134 mmol/L — ABNORMAL LOW (ref 135–145)

## 2020-06-05 LAB — CBC
HCT: 40.5 % (ref 39.0–52.0)
Hemoglobin: 13.7 g/dL (ref 13.0–17.0)
MCH: 31.8 pg (ref 26.0–34.0)
MCHC: 33.8 g/dL (ref 30.0–36.0)
MCV: 94 fL (ref 80.0–100.0)
Platelets: 205 10*3/uL (ref 150–400)
RBC: 4.31 MIL/uL (ref 4.22–5.81)
RDW: 13.5 % (ref 11.5–15.5)
WBC: 9.6 10*3/uL (ref 4.0–10.5)
nRBC: 0 % (ref 0.0–0.2)

## 2020-06-05 LAB — EXPECTORATED SPUTUM ASSESSMENT W GRAM STAIN, RFLX TO RESP C

## 2020-06-05 LAB — HIV ANTIBODY (ROUTINE TESTING W REFLEX): HIV Screen 4th Generation wRfx: NONREACTIVE

## 2020-06-05 LAB — LACTIC ACID, PLASMA: Lactic Acid, Venous: 1.2 mmol/L (ref 0.5–1.9)

## 2020-06-05 MED ORDER — UMECLIDINIUM BROMIDE 62.5 MCG/INH IN AEPB
1.0000 | INHALATION_SPRAY | Freq: Every day | RESPIRATORY_TRACT | Status: DC
  Administered 2020-06-05 – 2020-06-07 (×3): 1 via RESPIRATORY_TRACT
  Filled 2020-06-05: qty 7

## 2020-06-05 MED ORDER — ISAVUCONAZONIUM SULFATE 186 MG PO CAPS
372.0000 mg | ORAL_CAPSULE | Freq: Every day | ORAL | Status: DC
  Administered 2020-06-05 – 2020-06-07 (×3): 372 mg via ORAL
  Filled 2020-06-05 (×3): qty 2

## 2020-06-05 MED ORDER — GUAIFENESIN ER 600 MG PO TB12
600.0000 mg | ORAL_TABLET | Freq: Two times a day (BID) | ORAL | Status: DC
  Administered 2020-06-05 (×2): 600 mg via ORAL
  Filled 2020-06-05 (×2): qty 1

## 2020-06-05 MED ORDER — ENSURE ENLIVE PO LIQD
237.0000 mL | Freq: Two times a day (BID) | ORAL | Status: DC
  Administered 2020-06-05 – 2020-06-07 (×5): 237 mL via ORAL

## 2020-06-05 MED ORDER — GUAIFENESIN-DM 100-10 MG/5ML PO SYRP
5.0000 mL | ORAL_SOLUTION | ORAL | Status: DC | PRN
  Administered 2020-06-06: 5 mL via ORAL
  Filled 2020-06-05: qty 5

## 2020-06-05 MED ORDER — LEVOTHYROXINE SODIUM 88 MCG PO TABS
88.0000 ug | ORAL_TABLET | Freq: Every day | ORAL | Status: DC
  Administered 2020-06-05 – 2020-06-07 (×3): 88 ug via ORAL
  Filled 2020-06-05 (×3): qty 1

## 2020-06-05 MED ORDER — SODIUM CHLORIDE 0.9 % IV SOLN
1.0000 g | INTRAVENOUS | Status: DC
  Administered 2020-06-05 – 2020-06-06 (×2): 1 g via INTRAVENOUS
  Filled 2020-06-05: qty 10
  Filled 2020-06-05 (×2): qty 1

## 2020-06-05 MED ORDER — FLUTICASONE FUROATE-VILANTEROL 100-25 MCG/INH IN AEPB
1.0000 | INHALATION_SPRAY | Freq: Every day | RESPIRATORY_TRACT | Status: DC
  Administered 2020-06-05 – 2020-06-07 (×3): 1 via RESPIRATORY_TRACT
  Filled 2020-06-05: qty 28

## 2020-06-05 MED ORDER — TIOTROPIUM BROMIDE MONOHYDRATE 2.5 MCG/ACT IN AERS: 2.0000 | INHALATION_SPRAY | Freq: Every day | RESPIRATORY_TRACT | Status: DC

## 2020-06-05 MED ORDER — PREDNISONE 5 MG PO TABS
5.0000 mg | ORAL_TABLET | Freq: Every day | ORAL | Status: DC
  Administered 2020-06-05 – 2020-06-07 (×3): 5 mg via ORAL
  Filled 2020-06-05 (×3): qty 1

## 2020-06-05 MED ORDER — AZITHROMYCIN 250 MG PO TABS
500.0000 mg | ORAL_TABLET | Freq: Every day | ORAL | Status: DC
  Administered 2020-06-06 – 2020-06-07 (×2): 500 mg via ORAL
  Filled 2020-06-05 (×2): qty 2

## 2020-06-05 MED ORDER — ETHAMBUTOL HCL 400 MG PO TABS
1000.0000 mg | ORAL_TABLET | Freq: Every day | ORAL | Status: DC
  Administered 2020-06-05 – 2020-06-07 (×3): 1000 mg via ORAL
  Filled 2020-06-05 (×4): qty 2

## 2020-06-05 MED ORDER — HYDROCOD POLST-CPM POLST ER 10-8 MG/5ML PO SUER
5.0000 mL | Freq: Two times a day (BID) | ORAL | Status: DC
  Administered 2020-06-05 – 2020-06-07 (×5): 5 mL via ORAL
  Filled 2020-06-05 (×5): qty 5

## 2020-06-05 MED ORDER — CALCIUM CARBONATE-VITAMIN D 500-200 MG-UNIT PO TABS
1.0000 | ORAL_TABLET | Freq: Every day | ORAL | Status: DC
  Administered 2020-06-05 – 2020-06-07 (×3): 1 via ORAL
  Filled 2020-06-05 (×3): qty 1

## 2020-06-05 MED ORDER — ALBUTEROL SULFATE (2.5 MG/3ML) 0.083% IN NEBU: 2.5000 mg | INHALATION_SOLUTION | Freq: Four times a day (QID) | RESPIRATORY_TRACT | Status: DC | PRN

## 2020-06-05 NOTE — ED Notes (Signed)
Attempted report x 3.  

## 2020-06-05 NOTE — H&P (Signed)
History and Physical    NUCHEM GRATTAN ACZ:[id number] DOB: 05/24/1976 DOA: 06/04/2020  PCP: Robert Bellow, PA-C  Patient coming from: Home  I have personally briefly reviewed patient's old medical records in [place]  Chief Complaint: Hemoptysis  HPI: [name] is a 44 y.o. male with medical history significant for Mycobacterium avium infection, hx of aspergilloma s/p resection with CT surgery, allergic bronchopulmonary aspergillosis, hx of hemoptysis with IR embolization in 2016, pneumothoraxes with hx of intrabronchial valve placement who presents for hemoptysis.   Symptoms started several weeks ago initially with specks of blood with coughing but have progressed to several ounces of bright red blood daily. No associated fever, or shortness of breath.  Has occasional pleuritic chest pain.  He has been compliant with all of his medicines for MAC and aspergilloma. No recent travels or sick contact. Has had 2-dose COVID vaccine since March.  He follows with ID Dr. Tommy Medal and is current on lifelong antifungal therapy with Cresemba. Also prednisone, ethambutol and azithromycin. He also follows closely with pulmonologist [name].   In the ED, he had max temp of 99.4 and otherwise normal vitals.  Leukocytosis of 12.4. Lactate of 1.2. BMP overall unremarkable.   CTA chest showing chronic embolus vs occlusion of RUL and LUL segmental arteries.  Extensive chronic bullous in apical region reflecting persistent fungal or atypical superinfection.  There is ground glass and consolidative opacity in the LLL and RLL that could be infections or inflammatory.   Review of Systems:  Constitutional: No Weight Change, No Fever ENT/Mouth: No sore throat, No Rhinorrhea Eyes: No Eye Pain, No Vision Changes Cardiovascular: No Chest Pain, no SOB Respiratory: + Cough, No Sputum, No Wheezing, no Dyspnea  Gastrointestinal: No Nausea, No Vomiting, No Diarrhea, No Constipation, No  Pain Genitourinary: no Urinary Incontinence Musculoskeletal: No Arthralgias, No Myalgias Skin: No Skin Lesions, No Pruritus, Neuro: no Weakness, No Numbness Psych: No Anxiety/Panic, No Depression, no decrease appetite Heme/Lymph: No Bruising, + Bleeding  Past Medical History:  Diagnosis Date  . Anemia   . 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[place])   . Drug rash 12/16/2017  . Dyspnea   . Esophageal reflux    very rare  . 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 Lung disease, bullous ([place])   . MAI (mycobacterium avium-intracellulare) ([place])   . Mold exposure 12/05/2015  . 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 Solar lentigo 08/08/2015  . Weight loss 10/15/2016    Past Surgical History:  Procedure Laterality Date  . CHEST TUBE INSERTION Right 09/29/2018   Procedure: CHEST TUBE REPLACEMENT;  Surgeon: Melrose Nakayama, MD;  Location: [facility];  Service: Thoracic;  Laterality: Right;  . INSERTION OF IBV VALVE N/A 09/19/2018   Procedure: INSERTION OF INTERBRONCHIAL VALVE (IBV);  Surgeon: Melrose Nakayama, MD;  Location: [facility] OR;  Service: Thoracic;  Laterality: N/A;  . INSERTION OF IBV VALVE N/A 12/01/2018   Procedure: REMOVAL OF INTERBRONCHIAL VALVE (IBV);  Surgeon: Melrose Nakayama, MD;  Location: [facility] OR;  Service: Thoracic;  Laterality: N/A;  . left VATS  2012   thoracotomy and LUL apical posterior segmentectomy  . STAPLING OF BLEBS Right 09/12/2018   Procedure: STAPLING OF BLEBS, right lower and middle lung lobes;  Surgeon: Melrose Nakayama, MD;  Location: [facility];  Service: Thoracic;  Laterality: Right;  Marland Kitchen VIDEO ASSISTED THORACOSCOPY Right 09/12/2018   Procedure: VIDEO ASSISTED THORACOSCOPY, right lung;  Surgeon: Melrose Nakayama, MD;  Location: [facility]  OR;  Service: Thoracic;  Laterality: Right;  Marland Kitchen VIDEO BRONCHOSCOPY Bilateral 10/20/2015   Procedure: VIDEO BRONCHOSCOPY WITHOUT FLUORO;  Surgeon: Marshell Garfinkel, MD;  Location: [place];  Service: Cardiopulmonary;   Laterality: Bilateral;  . VIDEO BRONCHOSCOPY N/A 09/19/2018   Procedure: VIDEO BRONCHOSCOPY;  Surgeon: Melrose Nakayama, MD;  Location: [facility] OR;  Service: Thoracic;  Laterality: N/A;  . VIDEO BRONCHOSCOPY N/A 12/01/2018   Procedure: VIDEO BRONCHOSCOPY;  Surgeon: Melrose Nakayama, MD;  Location: [place];  Service: Thoracic;  Laterality: N/A;  . VIDEO BRONCHOSCOPY WITH INSERTION OF INTERBRONCHIAL VALVE (IBV) N/A 09/29/2018   Procedure: VIDEO BRONCHOSCOPY WITH INSERTION OF INTERBRONCHIAL VALVE  (IBV);  Surgeon: Melrose Nakayama, MD;  Location: [place];  Service: Thoracic;  Laterality: N/A;     reports that he quit smoking about 12 years ago. His smoking use included cigarettes. He has a 13.30 pack-year smoking history. He quit smokeless tobacco use about 25 years ago.  His smokeless tobacco use included snuff. He reports that he does not drink alcohol and does not use drugs.  Allergies  Allergen Reactions  . Clarithromycin Other (See Comments)    Adverse reaction w/ voriconazole UNSPECIFIED REACTION   . Rifaximin Other (See Comments)    Adverse reaction w/ voriconazole UNSPECIFIED REACTION     Family History  Adopted: Yes     Prior to Admission medications   Medication Sig Start Date End Date Taking? Authorizing Provider  albuterol (VENTOLIN HFA) 108 (90 Base) MCG/ACT inhaler Inhale 2 puffs into the lungs every 6 (six) hours as needed. Patient taking differently: Inhale 2 puffs into the lungs every 6 (six) hours as needed for wheezing or shortness of breath.  07/24/19  Yes Parrett, Tammy S, NP  azithromycin (ZITHROMAX) 500 MG tablet Take 1 tablet (500 mg total) by mouth daily. 02/03/20  Yes Tommy Medal, Lavell Islam, MD  Calcium Carbonate-Vitamin D 600-400 MG-UNIT tablet Take 1 tablet by mouth daily.    Yes [provider]  CRESEMBA 186 MG CAPS TAKE 2 CAPSULES BY MOUTH DAILY Patient taking differently: Take 372 mg by mouth daily.  04/19/20  Yes Tommy Medal, Lavell Islam, MD    EPINEPHrine 0.3 mg/0.3 mL IJ SOAJ injection Inject 0.3 mLs (0.3 mg total) into the muscle once for 1 dose. 04/30/19 07/23/20 Yes Brand Males, MD  ethambutol (MYAMBUTOL) 400 MG tablet TAKE 2& 1/2 TABLETS BY MOUTH ONCE DAILY Patient taking differently: Take 1,000 mg by mouth daily.  02/03/20  Yes Tommy Medal, Lavell Islam, MD  feeding supplement, ENSURE ENLIVE, (ENSURE ENLIVE) LIQD Take 237 mLs by mouth 3 (three) times daily between meals. Patient taking differently: Take 237 mLs by mouth 2 (two) times daily between meals.  10/06/18  Yes Lavina Hamman, MD  fluticasone furoate-vilanterol (BREO ELLIPTA) 100-25 MCG/INH AEPB Inhale 1 puff into the lungs daily. 10/28/19  Yes Brand Males, MD  guaiFENesin (MUCINEX) 600 MG 12 hr tablet Take 1 tablet (600 mg total) by mouth 2 (two) times daily. 10/06/18  Yes Lavina Hamman, MD  levothyroxine (SYNTHROID) 88 MCG tablet Take 88 mcg by mouth daily. 03/21/20  Yes [provider]  predniSONE (DELTASONE) 5 MG tablet Take 1 tablet (5 mg total) by mouth daily with breakfast. 10/09/19  Yes Ramaswamy, Belva Crome, MD  SPIRIVA RESPIMAT 2.5 MCG/ACT AERS INHALE 2 SPRAY(S) BY MOUTH ONCE DAILY Patient taking differently: Inhale 2 sprays into the lungs daily.  11/16/19  Yes Brand Males, MD  predniSONE (DELTASONE) 5 MG tablet TAKE 1 TABLET  BY MOUTH ONCE DAILY WITH BREAKFAST .  NEED  OFFICE  VISIT  FOR  REFILLS. Patient not taking: Reported on 06/04/2020 04/19/20   Brand Males, MD    Physical Exam: Vitals:   06/04/20 1845 06/04/20 2105 06/04/20 2330 06/05/20 0156  BP: 108/67  102/73 122/82  Pulse: 87  90 91  Resp: 20  20 (!) 27  Temp: 98.7 F (37.1 C)     TempSrc: Oral     SpO2: 97% 98% 98% 98%  Weight:      Height:        Constitutional: NAD, calm, comfortable, well-appearing young male laying flat in bed asleep Vitals:   06/04/20 1845 06/04/20 2105 06/04/20 2330 06/05/20 0156  BP: 108/67  102/73 122/82  Pulse: 87  90 91  Resp: 20  20 (!) 27   Temp: 98.7 F (37.1 C)     TempSrc: Oral     SpO2: 97% 98% 98% 98%  Weight:      Height:       Eyes: PERRL, lids and conjunctivae normal ENMT: Mucous membranes are moist. Normal dentition.  Neck: normal, supple Respiratory: Decreased aeration throughout but, no wheezing, no crackles. Normal respiratory effort on room air. No accessory muscle use.  Had occasional dry cough without hemoptysis. Cardiovascular: Regular rate and rhythm, no murmurs / rubs / gallops. No extremity edema.  Abdomen: no tenderness, no masses palpated.Bowel sounds positive.  Musculoskeletal: no clubbing / cyanosis. No joint deformity upper and lower extremities. Good ROM, no contractures. Normal muscle tone.  Skin: no rashes, lesions, ulcers. No induration Neurologic: CN 2-12 grossly intact. Sensation intact. Strength 5/5 in all 4.  Psychiatric: Normal judgment and insight. Alert and oriented x 3. Normal mood.     Labs on Admission: I have personally reviewed following labs and imaging studies  CBC: Recent Labs  Lab 06/04/20 1735  WBC 12.4*  HGB 14.3  HCT 42.3  MCV 93.2  PLT 562   Basic Metabolic Panel: Recent Labs  Lab 06/04/20 1735  NA 134*  K 4.0  CL 100  CO2 22  GLUCOSE 124*  BUN 13  CREATININE 0.91  CALCIUM 8.7*   GFR: Estimated Creatinine Clearance: 83.9 mL/min (by C-G formula based on SCr of 0.91 mg/dL). Liver Function Tests: No results for input(s): AST, ALT, ALKPHOS, BILITOT, PROT, ALBUMIN in the last 168 hours. No results for input(s): LIPASE, AMYLASE in the last 168 hours. No results for input(s): AMMONIA in the last 168 hours. Coagulation Profile: No results for input(s): INR, PROTIME in the last 168 hours. Cardiac Enzymes: No results for input(s): CKTOTAL, CKMB, CKMBINDEX, TROPONINI in the last 168 hours. BNP (last 3 results) No results for input(s): PROBNP in the last 8760 hours. HbA1C: No results for input(s): HGBA1C in the last 72 hours. CBG: No results for input(s):  GLUCAP in the last 168 hours. Lipid Profile: No results for input(s): CHOL, HDL, LDLCALC, TRIG, CHOLHDL, LDLDIRECT in the last 72 hours. Thyroid Function Tests: No results for input(s): TSH, T4TOTAL, FREET4, T3FREE, THYROIDAB in the last 72 hours. Anemia Panel: No results for input(s): VITAMINB12, FOLATE, FERRITIN, TIBC, IRON, RETICCTPCT in the last 72 hours. Urine analysis:    Component Value Date/Time   COLORURINE YELLOW 09/11/2018 [place] 09/11/2018 1714   LABSPEC 1.016 09/11/2018 1714   PHURINE 6.0 09/11/2018 1714   GLUCOSEU NEGATIVE 09/11/2018 1714   HGBUR NEGATIVE 09/11/2018 1714   BILIRUBINUR NEGATIVE 09/11/2018 1714   KETONESUR NEGATIVE 09/11/2018 1714  PROTEINUR NEGATIVE 09/11/2018 1714   UROBILINOGEN 0.2 05/28/2011 0943   NITRITE NEGATIVE 09/11/2018 1714   LEUKOCYTESUR NEGATIVE 09/11/2018 1714    Radiological Exams on Admission: DG Chest 2 View  Result Date: 06/04/2020 CLINICAL DATA:  Hemoptysis for 2 weeks. Decreased oxygen saturation. EXAM: CHEST - 2 VIEW COMPARISON:  PA and lateral chest 12/16/2018. FINDINGS: Severe bullous emphysema is again seen. There is a new focus of airspace disease in the lingula. Trace right pleural effusion is decreased compared to the prior exam. No pneumothorax. Heart size is normal. No acute bony abnormality. Remote midthoracic compression fracture noted. IMPRESSION: New focus of patchy airspace disease in the lingula worrisome for pneumonia superimposed on severe bullous emphysema. Electronically Signed   By: Inge Rise M.D.   On: 06/04/2020 17:46   CT Angio Chest PE W and/or Wo Contrast  Result Date: 06/04/2020 CLINICAL DATA:  Shortness of breath, 2 weeks of hemoptysis EXAM: CT ANGIOGRAPHY CHEST WITH CONTRAST TECHNIQUE: Multidetector CT imaging of the chest was performed using the standard protocol during bolus administration of intravenous contrast. Multiplanar CT image reconstructions and MIPs were obtained to  evaluate the vascular anatomy. CONTRAST:  167m OMNIPAQUE IOHEXOL 350 MG/ML SOLN COMPARISON:  CT 09/04/2018 FINDINGS: Cardiovascular: Satisfactory opacification the pulmonary arteries to the segmental level. Chronic narrowing and occlusion several right upper lobe segmental and subsegmental pulmonary arteries (coronal MIP 10/76-78). Additional filling defects present in the left upper lobe segmental and subsegmental branches as well. These are both within regions of extensive bullous disease, architectural distortion and surgical change and the peripherally marginated appearance is most suggestive of chronic embolus/occlusion. No other pulmonary artery emboli are identified. While the RV/LV ratio is increased (1.15) the intraventricular septum remains appropriately convexed and there is associated right atrial enlargement with reflux of contrast which could suggest more longstanding right heart enlargement and chronic pulmonary artery hypertension. Suboptimal opacification of the thoracic aorta. No gross luminal abnormality. Atherosclerotic plaque within the normal caliber aorta. Normal 3 vessel branching of the aortic arch. Proximal great vessels are unremarkable. No major venous abnormalities. Mediastinum/Nodes: No mediastinal fluid or gas. Normal thyroid gland and thoracic inlet. No acute abnormality of the trachea or esophagus. No worrisome mediastinal, hilar or axillary adenopathy. Lungs/Pleura: Extensive chronic bullous paraseptal predominant emphysematous changes. Scattered areas of thick walled cystic bulla towards the apices with some peripheral nodularity, including a recess of cystic change demonstrating layering air-fluid level. There are mixed areas of ground-glass and consolidative opacity in the left lower and right lower lobe periphery. Some associated airways thickening and scattered secretions are noted. Numerous calcifications may reflect sequela of prior granulomatous disease. Upper Abdomen:  Calcified gallstone within the otherwise normal gallbladder. Fluid attenuation cyst seen in the upper pole left kidney. No acute abnormalities present in the visualized portions of the upper abdomen. Musculoskeletal: Multilevel degenerative changes are present in the imaged portions of the spine. Exaggerated thoracic kyphosis is similar to prior. No acute chest wall abnormality. Cachectic appearance with paucity of subcutaneous fat. Moderate bilateral gynecomastia. Review of the MIP images confirms the above findings. IMPRESSION: 1. 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 Additional filling defects present in the left upper lobe segmental and subsegmental branches as well. These are both within regions of extensive bullous disease, architectural distortion and surgical change and the peripherally marginated appearance is most suggestive of chronic embolus/occlusion. 2. While the RV/LV ratio is increased (1.15) the intraventricular septum remains appropriately convex and there is associated right  atrial enlargement with reflux of contrast which could suggest more longstanding right heart enlargement and chronic pulmonary artery hypertension rather than acute right heart strain though if there is clinical concern, echocardiography could be performed for more definitive assessment. 3. Extensive chronic bullous paraseptal predominant emphysematous changes. Scattered areas of thick walled cystic bulla towards the apices with some peripheral nodularity and layering air-fluid level within the left apical bulla which could reflect a persisting fungal or atypical superinfection. Certainly could present with hemoptysis. 4. Further mixed areas of ground-glass and consolidative opacity in the left lower and right lower lobe periphery, also likely infectious or inflammatory with the differential including potential atypical viral infection such as COVID-19. 5.  Cholelithiasis. 6. Moderate bilateral gynecomastia. 7. Paucity of subcutaneous fat.  Correlate with nutritional status. 8. Aortic Atherosclerosis (ICD10-I70.0). 9. Emphysema (ICD10-J43.9). These results were called by telephone at the time of interpretation on 06/04/2020 at 10:50 pm to provider DAVID YAO , who verbally acknowledged these results. Electronically Signed   By: Lovena Le M.D.   On: 06/04/2020 22:50      Assessment/Plan  Hemoptysis Questionable new lower bilateral lobe pneumonia - has mild leukocytosis Received Levaquin in the ED. Will need pulmonology and ID consult for antibiotic guidance  Obtain sputum culture Avoid DVT prophylaxis and blood thinners   Aspergilloma and MAI Continue Cresemba, ethambutol and azithromycin  Severe persistent asthma with ABPA Continue prednisone, Spiriva, breo  Hypothyroidism Continue levothyroxine  DVT prophylaxis:.SCDs Code Status: Full Family Communication: Plan discussed with patient at bedside  disposition Plan: Home with at least 2 midnight stays  Consults called:  Admission status: inpatient Status is: Inpatient  Remains inpatient appropriate because:Inpatient level of care appropriate due to severity of illness   Dispo: The patient is from: Home              Anticipated d/c is to: Home              Anticipated d/c date is: 3 days              Patient currently is not medically stable to d/c.         Orene Desanctis DO [facility]   If 7PM-7AM, please contact night-coverage [email or web address]   06/05/2020, 2:02 AM

## 2020-06-05 NOTE — Progress Notes (Signed)
PROGRESS NOTE    [name]  YTK:[id number] DOB: Feb 09, 1976 DOA: 06/04/2020 PCP: Robert Bellow, PA-C    Brief Narrative:  Patient admitted to the hospital with the working diagnosis of community-acquired pneumonia, complicated with hemoptysis.  44 year old male with past medical history for pulmonary aspergilloma, allergic bronchopulmonary aspergillosis, Mycobacterium avium infection, history of hemoptysis status post IR embolization 2016, and status post intrabronchial valve placement for hemoptysis. Patient reported several weeks of hemoptysis that initially small amounts, that progressed to several onces of bright red blood per day.  Symptoms associated with intermittent pleuritic chest pain. Patient follows up with infectious disease and pulmonary as an outpatient, he is on lifelong antifungal therapy with Cresemba, along with antimycobacterial treatment, ethambutol, azithromycin and prednisone.  On his initial physical examination blood pressure 128/67, heart rate 87, respirate 20 to 27, oxygen saturation 98%.  He had decreased breath sounds bilaterally, no wheezing, no rales, heart S1-S2, present rhythm, soft abdomen, no lower extremity edema.  Sodium 134, potassium 4.0, chloride 100, bicarb 22, glucose 124, BUN 13, creatinine 0.91, white count 12.4, hemoglobin 14.3, hematocrit 40.3, platelets 245.  SARS COVID-19 negative.  Chest x-ray with hyperinflation, biapical large bullae, positive fibrotic changes bilaterally, left lower lobe patchy infiltrate. CT chest with extensive chronic bullous disease, pulmonary scaring, some of the bullae have air-fluid levels.  Consultation, left lower lobe, right lower lobe periphery.  Positive signs of chronic pulmonary hypertension. EKG 91 bpm, normal axis, normal intervals, sinus rhythm, no ST segment or T wave changes.   Assessment & Plan:   Principal Problem:   Hemoptysis Active Problems:   Mycobacterium avium complex ([place])   ABPA (allergic  bronchopulmonary aspergillosis) ([place])   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[facility])   Severe persistent asthma   Hypothyroidism   1. Left lower lobe community acquired pneumonia/ present on admission/ complicated with hemoptysis. Oxygenation is 96% on room air, hemoptysis seems to be improving. Hgb is 13,7 with Hct at 40.5.   Continue antibiotic therapy for community acquired pneumonia with ceftriaxone and azithromycin. Add antitussive agents and use broncho dilater therapy as needed.   If no further hemoptysis will plan to transition to oral antibiotic therapy.   2. Pulmonary emphysema with diffuse bullae disease, MAC and pulmonary aspergillosis. (HIV non reactive in 2012). Patient has been on suppressive therapy ethambutol and azithromycin. Continue with prednisone and cresemba.   Patient will follow up as outpatient with ID and pulmonary.   3. Hypothyroid. Continue with levothyroxine.   4. Asthma. No signs of acute exacerbation, continue with incruse ellipta, breo  and as needed albuterol.    Patient continue to be at high risk for recurrent and worsening hemoptysis   Status is: Inpatient  Remains inpatient appropriate because:IV treatments appropriate due to intensity of illness or inability to take PO   Dispo: The patient is from: Home              Anticipated d/c is to: Home              Anticipated d/c date is: 1 day              Patient currently is not medically stable to d/c.   DVT prophylaxis: scd   Code Status:   full  Family Communication:  No family at the bedside      Nutrition Status:           Skin Documentation:    Antimicrobials:   Ceftriaxone   Azithromycin     Subjective: Patient  with improved dyspnea and hemoptysis but not yet back to baseline, no nausea or vomiting, no chest pain.   Objective: Vitals:   06/05/20 0745 06/05/20 0815 06/05/20 0834 06/05/20 0911  BP: 108/77 90/66 92/68 116/80  Pulse: 81 74 (!) 52 70  Resp: 18 (!) _0 Temp:   98.3 F  (36.8 C) 98 F (36.7 C)  TempSrc:   Oral   SpO2: 97% 95% 98% 96%  Weight:      Height:        Intake/Output Summary (Last 24 hours) at 06/05/2020 1116 Last data filed at 06/05/2020 0920 Gross per 24 hour  Intake 240 ml  Output --  Net 240 ml   Filed Weights   06/04/20 1711  Weight: 58.5 kg    Examination:   General: Not in pain or dyspnea., deconditioned  Neurology: Awake and alert, non focal  E ENT: no pallor, no icterus, oral mucosa moist Cardiovascular: No JVD. S1-S2 present, rhythmic, no gallops, rubs, or murmurs. No lower extremity edema. Pulmonary: positive breath sounds bilaterally, decreased air movement, no wheezing, or rhonchi, positive bilateral rales. Gastrointestinal. Abdomen soft with no organomegaly, non tender, no rebound or guarding Skin. No rashes Musculoskeletal: no joint deformities     Data Reviewed: I have personally reviewed following labs and imaging studies  CBC: Recent Labs  Lab 06/04/20 1735 06/05/20 0422  WBC 12.4* 9.6  HGB 14.3 13.7  HCT 42.3 40.5  MCV 93.2 94.0  PLT 245 143   Basic Metabolic Panel: Recent Labs  Lab 06/04/20 1735 06/05/20 0422  NA 134* 134*  K 4.0 3.9  CL 100 103  CO2 22 21*  GLUCOSE 124* 155*  BUN 13 10  CREATININE 0.91 0.72  CALCIUM 8.7* 8.4*   GFR: Estimated Creatinine Clearance: 95.5 mL/min (by C-G formula based on SCr of 0.72 mg/dL). Liver Function Tests: No results for input(s): AST, ALT, ALKPHOS, BILITOT, PROT, ALBUMIN in the last 168 hours. No results for input(s): LIPASE, AMYLASE in the last 168 hours. No results for input(s): AMMONIA in the last 168 hours. Coagulation Profile: No results for input(s): INR, PROTIME in the last 168 hours. Cardiac Enzymes: No results for input(s): CKTOTAL, CKMB, CKMBINDEX, TROPONINI in the last 168 hours. BNP (last 3 results) No results for input(s): PROBNP in the last 8760 hours. HbA1C: No results for input(s): HGBA1C in the last 72 hours. CBG: No results  for input(s): GLUCAP in the last 168 hours. Lipid Profile: No results for input(s): CHOL, HDL, LDLCALC, TRIG, CHOLHDL, LDLDIRECT in the last 72 hours. Thyroid Function Tests: No results for input(s): TSH, T4TOTAL, FREET4, T3FREE, THYROIDAB in the last 72 hours. Anemia Panel: No results for input(s): VITAMINB12, FOLATE, FERRITIN, TIBC, IRON, RETICCTPCT in the last 72 hours.    Radiology Studies: I have reviewed all of the imaging during this hospital visit personally     Scheduled Meds: . [START ON 06/06/2020] azithromycin  500 mg Oral Daily  . calcium-vitamin D  1 tablet Oral Daily  . ethambutol  1,000 mg Oral Daily  . feeding supplement (ENSURE ENLIVE)  237 mL Oral BID BM  . fluticasone furoate-vilanterol  1 puff Inhalation Daily  . guaiFENesin  600 mg Oral BID  . Isavuconazonium Sulfate  372 mg Oral Daily  . levothyroxine  88 mcg Oral Daily  . predniSONE  5 mg Oral Q breakfast  . sodium chloride flush  3 mL Intravenous Once  . umeclidinium bromide  1 puff Inhalation Daily  Continuous Infusions:   LOS: 0 days        Adama Ivins Gerome Apley, MD

## 2020-06-05 NOTE — ED Notes (Signed)
ED TO INPATIENT HANDOFF REPORT  ED Nurse Name and Phone #:  Percell Locus, RN  S Name/Age/Gender [name] 44 y.o. male Room/Bed: 037C/037C  Code Status   Code Status: Full Code  Home/SNF/Other Home Patient oriented to: self, place, time and situation Is this baseline? Yes   Triage Complete: Triage complete  Chief Complaint Hemoptysis [R04.2]  Triage Note To triage via EMS.  Onset 2 weeks coughing up phlegm with blood.  Onset the past several days has been coughing up just blood, last time coughed up blood 10am today.   Pt was short of breath this morning, checked O2 sats and was in the 80's, HR 120 but was having coughing spell.  Pt states this happens every couple years and he has to be admitted to hospital.   EMS BP 128/88 HR 90 SpO2 97%     Allergies Allergies  Allergen Reactions  . Clarithromycin Other (See Comments)    Adverse reaction w/ voriconazole UNSPECIFIED REACTION   . Rifaximin Other (See Comments)    Adverse reaction w/ voriconazole UNSPECIFIED REACTION     Level of Care/Admitting Diagnosis ED Disposition    ED Disposition Condition [facility] Area: [place] [100100]  Level of Care: Telemetry Medical [104]  May admit patient to [facility] or [facility] if equivalent level of care is available:: No  Covid Evaluation: Asymptomatic Screening Protocol (No Symptoms)  Diagnosis: Hemoptysis [828003]  Admitting Physician: Orene Desanctis [[id number]]  Attending Physician: Orene Desanctis [[id number]]  Estimated length of stay: past midnight tomorrow  Certification:: I certify this patient will need inpatient services for at least 2 midnights       B Medical/Surgery History Past Medical History:  Diagnosis Date  . Anemia   . 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 Drug rash 12/16/2017  . Dyspnea   . Esophageal reflux    very rare  . 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 Lung disease, bullous ([place])   . MAI  (mycobacterium avium-intracellulare) ([place])   . Mold exposure 12/05/2015  . 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 Solar lentigo 08/08/2015  . Weight loss 10/15/2016   Past Surgical History:  Procedure Laterality Date  . CHEST TUBE INSERTION Right 09/29/2018   Procedure: CHEST TUBE REPLACEMENT;  Surgeon: Melrose Nakayama, MD;  Location: [place];  Service: Thoracic;  Laterality: Right;  . INSERTION OF IBV VALVE N/A 09/19/2018   Procedure: INSERTION OF INTERBRONCHIAL VALVE (IBV);  Surgeon: Melrose Nakayama, MD;  Location: [facility] OR;  Service: Thoracic;  Laterality: N/A;  . INSERTION OF IBV VALVE N/A 12/01/2018   Procedure: REMOVAL OF INTERBRONCHIAL VALVE (IBV);  Surgeon: Melrose Nakayama, MD;  [facility] OR;  Service: Thoracic;  Laterality: N/A;  . left VATS  2012   thoracotomy and LUL apical posterior segmentectomy  . STAPLING OF BLEBS Right 09/12/2018   Procedure: STAPLING OF BLEBS, right lower and middle lung lobes;  Surgeon: Melrose Nakayama, MD;  Location: [place];  Service: Thoracic;  Laterality: Right;  Marland Kitchen VIDEO ASSISTED THORACOSCOPY Right 09/12/2018   Procedure: VIDEO ASSISTED THORACOSCOPY, right lung;  Surgeon: Melrose Nakayama, MD;  Location: [place];  Service: Thoracic;  Laterality: Right;  Marland Kitchen VIDEO BRONCHOSCOPY Bilateral 10/20/2015   Procedure: VIDEO BRONCHOSCOPY WITHOUT FLUORO;  Surgeon: Marshell Garfinkel, MD;  Location: [place];  Service: Cardiopulmonary;  Laterality: Bilateral;  . VIDEO BRONCHOSCOPY N/A 09/19/2018   Procedure: VIDEO BRONCHOSCOPY;  Surgeon: Roxan Hockey,  Revonda Standard, MD;  Location: [place];  Service: Thoracic;  Laterality: N/A;  . VIDEO BRONCHOSCOPY N/A 12/01/2018   Procedure: VIDEO BRONCHOSCOPY;  Surgeon: Melrose Nakayama, MD;  Location: [place];  Service: Thoracic;  Laterality: N/A;  . VIDEO BRONCHOSCOPY WITH INSERTION OF INTERBRONCHIAL VALVE (IBV) N/A 09/29/2018   Procedure: VIDEO BRONCHOSCOPY WITH INSERTION OF INTERBRONCHIAL VALVE  (IBV);  Surgeon:  Melrose Nakayama, MD;  Location: [place];  Service: Thoracic;  Laterality: N/A;     A IV Location/Drains/Wounds Patient Lines/Drains/Airways Status    Active Line/Drains/Airways    Name Placement date Placement time Site Days   Peripheral IV 06/04/20 Left Antecubital 06/04/20  2158  Antecubital  1   Incision (Closed) Chest Right --  --             Intake/Output Last 24 hours No intake or output data in the 24 hours ending 06/05/20 0832  Labs/Imaging Results for orders placed or performed during the hospital encounter of 06/04/20 (from the past 48 hour(s))  Basic metabolic panel     Status: Abnormal   Collection Time: 06/04/20  5:35 PM  Result Value Ref Range   Sodium 134 (L) 135 - 145 mmol/L   Potassium 4.0 3.5 - 5.1 mmol/L   Chloride 100 98 - 111 mmol/L   CO2 22 22 - 32 mmol/L   Glucose, Bld 124 (H) 70 - 99 mg/dL    Comment: Glucose reference range applies only to samples taken after fasting for at least 8 hours.   BUN 13 6 - 20 mg/dL   Creatinine, Ser 0.91 0.61 - 1.24 mg/dL   Calcium 8.7 (L) 8.9 - 10.3 mg/dL   GFR calc non Af Amer >60 >60 mL/min   GFR calc Af Amer >60 >60 mL/min   Anion gap 12 5 - 15    Comment: Performed at [facility] [address], [place], [place] [address]  CBC     Status: Abnormal   Collection Time: 06/04/20  5:35 PM  Result Value Ref Range   WBC 12.4 (H) 4.0 - 10.5 K/uL   RBC 4.54 4.22 - 5.81 MIL/uL   Hemoglobin 14.3 13.0 - 17.0 g/dL   HCT 42.3 39 - 52 %   MCV 93.2 80.0 - 100.0 fL   MCH 31.5 26.0 - 34.0 pg   MCHC 33.8 30.0 - 36.0 g/dL   RDW 13.5 11.5 - 15.5 %   Platelets 245 150 - 400 K/uL   nRBC 0.0 0.0 - 0.2 %    Comment: Performed at [facility], [place] [address] [place], [place] [address]  Troponin I (High Sensitivity)     Status: None   Collection Time: 06/04/20  5:35 PM  Result Value Ref Range   Troponin I (High Sensitivity) 5 <18 ng/L    Comment: (NOTE) Elevated high sensitivity troponin I (hsTnI) values and  significant  changes across serial measurements may suggest ACS but many other  chronic and acute conditions are known to elevate hsTnI results.  Refer to the "Links" section for chest pain algorithms and additional  guidance. Performed at [facility], [place] [address] [place], [place] [address]   Troponin I (High Sensitivity)     Status: None   Collection Time: 06/04/20  9:23 PM  Result Value Ref Range   Troponin I (High Sensitivity) 5 <18 ng/L    Comment: (NOTE) Elevated high sensitivity troponin I (hsTnI) values and significant  changes across serial measurements  may suggest ACS but many other  chronic and acute conditions are known to elevate hsTnI results.  Refer to the "Links" section for chest pain algorithms and additional  guidance. Performed at [facility], [place] [address], [place], [place] [address]   Lactic acid, plasma     Status: None   Collection Time: 06/04/20  9:23 PM  Result Value Ref Range   Lactic Acid, Venous 1.2 0.5 - 1.9 mmol/L    Comment: Performed at [facility] [address] [place], [place] [address]  SARS Coronavirus 2 by RT PCR (hospital order, performed in [facility] hospital lab) Nasopharyngeal Nasopharyngeal Swab     Status: None   Collection Time: 06/04/20 10:12 PM   Specimen: Nasopharyngeal Swab  Result Value Ref Range   SARS Coronavirus 2 NEGATIVE NEGATIVE    Comment: (NOTE) SARS-CoV-2 target nucleic acids are NOT DETECTED.  The SARS-CoV-2 RNA is generally detectable in upper and lower respiratory specimens during the acute phase of infection. The lowest concentration of SARS-CoV-2 viral copies this assay can detect is 250 copies / mL. A negative result does not preclude SARS-CoV-2 infection and should not be used as the sole basis for treatment or other patient management decisions.  A negative result may occur with improper specimen collection / handling, submission of specimen other than nasopharyngeal swab,  presence of viral mutation(s) within the areas targeted by this assay, and inadequate number of viral copies (<250 copies / mL). A negative result must be combined with clinical observations, patient history, and epidemiological information.  Fact Sheet for Patients:   StrictlyIdeas.no  Fact Sheet for Healthcare Providers: BankingDealers.co.za  This test is not yet approved or  cleared by the [place] FDA and has been authorized for detection and/or diagnosis of SARS-CoV-2 by FDA under an Emergency Use Authorization (EUA).  This EUA will remain in effect (meaning this test can be used) for the duration of the COVID-19 declaration under Section 564(b)(1) of the Act, 21 U.S.C. section 360bbb-3(b)(1), unless the authorization is terminated or revoked sooner.  Performed at [facility], [place] [address] [place], [place] [address]   Expectorated sputum assessment w rflx to resp cult     Status: None   Collection Time: 06/05/20  2:55 AM   Specimen: Sputum  Result Value Ref Range   Specimen Description SPUTUM    Special Requests NONE    Sputum evaluation      THIS SPECIMEN IS ACCEPTABLE FOR SPUTUM CULTURE Performed at [facility], [place] [address], [place], [place] [address]    Report Status 06/05/2020 FINAL   Culture, respiratory     Status: None (Preliminary result)   Collection Time: 06/05/20  2:55 AM   Specimen: SPU  Result Value Ref Range   Specimen Description SPUTUM    Special Requests NONE Reflexed from [id number]    Gram Stain      MODERATE WBC PRESENT, PREDOMINANTLY PMN MODERATE GRAM POSITIVE COCCI IN PAIRS IN CHAINS FEW GRAM NEGATIVE RODS Performed at [facility], [place] [address] [place], [place] [address]    Culture PENDING    Report Status PENDING   Lactic acid, plasma     Status: None   Collection Time: 06/05/20  4:22 AM  Result Value Ref Range   Lactic Acid, Venous 1.2 0.5 - 1.9 mmol/L     Comment: Performed at [facility] [address], [place], [place] [address]  Basic metabolic panel  Status: Abnormal   Collection Time: 06/05/20  4:22 AM  Result Value Ref Range   Sodium 134 (L) 135 - 145 mmol/L   Potassium 3.9 3.5 - 5.1 mmol/L   Chloride 103 98 - 111 mmol/L   CO2 21 (L) 22 - 32 mmol/L   Glucose, Bld 155 (H) 70 - 99 mg/dL    Comment: Glucose reference range applies only to samples taken after fasting for at least 8 hours.   BUN 10 6 - 20 mg/dL   Creatinine, Ser 0.72 0.61 - 1.24 mg/dL   Calcium 8.4 (L) 8.9 - 10.3 mg/dL   GFR calc non Af Amer >60 >60 mL/min   GFR calc Af Amer >60 >60 mL/min   Anion gap 10 5 - 15    Comment: Performed at [facility] [address] [place] [address]  CBC     Status: None   Collection Time: 06/05/20  4:22 AM  Result Value Ref Range   WBC 9.6 4.0 - 10.5 K/uL   RBC 4.31 4.22 - 5.81 MIL/uL   Hemoglobin 13.7 13.0 - 17.0 g/dL   HCT 40.5 39 - 52 %   MCV 94.0 80.0 - 100.0 fL   MCH 31.8 26.0 - 34.0 pg   MCHC 33.8 30.0 - 36.0 g/dL   RDW 13.5 11.5 - 15.5 %   Platelets 205 150 - 400 K/uL   nRBC 0.0 0.0 - 0.2 %    Comment: Performed at [facility], [place] [address] [place], [place] [address]   DG Chest 2 View  Result Date: 06/04/2020 CLINICAL DATA:  Hemoptysis for 2 weeks. Decreased oxygen saturation. EXAM: CHEST - 2 VIEW COMPARISON:  PA and lateral chest 12/16/2018. FINDINGS: Severe bullous emphysema is again seen. There is a new focus of airspace disease in the lingula. Trace right pleural effusion is decreased compared to the prior exam. No pneumothorax. Heart size is normal. No acute bony abnormality. Remote midthoracic compression fracture noted. IMPRESSION: New focus of patchy airspace disease in the lingula worrisome for pneumonia superimposed on severe bullous emphysema. Electronically Signed   By: Inge Rise M.D.   On: 06/04/2020 17:46   CT Angio Chest PE W and/or Wo Contrast  Result Date:  06/04/2020 CLINICAL DATA:  Shortness of breath, 2 weeks of hemoptysis EXAM: CT ANGIOGRAPHY CHEST WITH CONTRAST TECHNIQUE: Multidetector CT imaging of the chest was performed using the standard protocol during bolus administration of intravenous contrast. Multiplanar CT image reconstructions and MIPs were obtained to evaluate the vascular anatomy. CONTRAST:  178m OMNIPAQUE IOHEXOL 350 MG/ML SOLN COMPARISON:  CT 09/04/2018 FINDINGS: Cardiovascular: Satisfactory opacification the pulmonary arteries to the segmental level. Chronic narrowing and occlusion several right upper lobe segmental and subsegmental pulmonary arteries (coronal MIP 10/76-78). Additional filling defects present in the left upper lobe segmental and subsegmental branches as well. These are both within regions of extensive bullous disease, architectural distortion and surgical change and the peripherally marginated appearance is most suggestive of chronic embolus/occlusion. No other pulmonary artery emboli are identified. While the RV/LV ratio is increased (1.15) the intraventricular septum remains appropriately convexed and there is associated right atrial enlargement with reflux of contrast which could suggest more longstanding right heart enlargement and chronic pulmonary artery hypertension. Suboptimal opacification of the thoracic aorta. No gross luminal abnormality. Atherosclerotic plaque within the normal caliber aorta. Normal 3 vessel branching of the aortic arch. Proximal great vessels are unremarkable. No major venous abnormalities. Mediastinum/Nodes: No mediastinal fluid or gas.  Normal thyroid gland and thoracic inlet. No acute abnormality of the trachea or esophagus. No worrisome mediastinal, hilar or axillary adenopathy. Lungs/Pleura: Extensive chronic bullous paraseptal predominant emphysematous changes. Scattered areas of thick walled cystic bulla towards the apices with some peripheral nodularity, including a recess of cystic change  demonstrating layering air-fluid level. There are mixed areas of ground-glass and consolidative opacity in the left lower and right lower lobe periphery. Some associated airways thickening and scattered secretions are noted. Numerous calcifications may reflect sequela of prior granulomatous disease. Upper Abdomen: Calcified gallstone within the otherwise normal gallbladder. Fluid attenuation cyst seen in the upper pole left kidney. No acute abnormalities present in the visualized portions of the upper abdomen. Musculoskeletal: Multilevel degenerative changes are present in the imaged portions of the spine. Exaggerated thoracic kyphosis is similar to prior. No acute chest wall abnormality. Cachectic appearance with paucity of subcutaneous fat. Moderate bilateral gynecomastia. Review of the MIP images confirms the above findings. IMPRESSION: 1. 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 Additional filling defects present in the left upper lobe segmental and subsegmental branches as well. These are both within regions of extensive bullous disease, architectural distortion and surgical change and the peripherally marginated appearance is most suggestive of chronic embolus/occlusion. 2. While the RV/LV ratio is increased (1.15) the intraventricular septum remains appropriately convex and there is associated right atrial enlargement with reflux of contrast which could suggest more longstanding right heart enlargement and chronic pulmonary artery hypertension rather than acute right heart strain though if there is clinical concern, echocardiography could be performed for more definitive assessment. 3. Extensive chronic bullous paraseptal predominant emphysematous changes. Scattered areas of thick walled cystic bulla towards the apices with some peripheral nodularity and layering air-fluid level within the left apical bulla which could reflect a persisting fungal  or atypical superinfection. Certainly could present with hemoptysis. 4. Further mixed areas of ground-glass and consolidative opacity in the left lower and right lower lobe periphery, also likely infectious or inflammatory with the differential including potential atypical viral infection such as COVID-19. 5. Cholelithiasis. 6. Moderate bilateral gynecomastia. 7. Paucity of subcutaneous fat.  Correlate with nutritional status. 8. Aortic Atherosclerosis (ICD10-I70.0). 9. Emphysema (ICD10-J43.9). These results were called by telephone at the time of interpretation on 06/04/2020 at 10:50 pm to provider DAVID YAO , who verbally acknowledged these results. Electronically Signed   By: Lovena Le M.D.   On: 06/04/2020 22:50    Pending Labs Unresulted Labs (From admission, onward) Comment          Start     Ordered   06/05/20 0057  HIV Antibody (routine testing w rflx)  (HIV Antibody (Routine testing w reflex) panel)  Once,   STAT        06/05/20 0058   06/04/20 2021  Blood culture (routine x 2)  BLOOD CULTURE X 2,   STAT      06/04/20 2021          Vitals/Pain Today's Vitals   06/05/20 0552 06/05/20 0613 06/05/20 0745 06/05/20 0815  BP:  122/85 108/77 90/66  Pulse: 72 98 81 74  Resp: 20 (!) 25 18 (!) 21  Temp:  98.4 F (36.9 C)    TempSrc:  Oral    SpO2: 96% 93% 97% 95%  Weight:      Height:        Isolation Precautions No active isolations  Medications Medications  sodium chloride flush (NS) 0.9 % injection 3  mL (3 mLs Intravenous Not Given 06/05/20 0204)  azithromycin (ZITHROMAX) tablet 500 mg (has no administration in time range)  Isavuconazonium Sulfate CAPS 372 mg (has no administration in time range)  ethambutol (MYAMBUTOL) tablet 1,000 mg (has no administration in time range)  levothyroxine (SYNTHROID) tablet 88 mcg (88 mcg Oral Given 06/05/20 0612)  predniSONE (DELTASONE) tablet 5 mg (has no administration in time range)  feeding supplement (ENSURE ENLIVE) (ENSURE ENLIVE)  liquid 237 mL (has no administration in time range)  Calcium Carbonate-Vitamin D 600-400 MG-UNIT 1 tablet (has no administration in time range)  albuterol (PROVENTIL) (2.5 MG/3ML) 0.083% nebulizer solution 2.5 mg (has no administration in time range)  Tiotropium Bromide Monohydrate AERS 2 spray (has no administration in time range)  fluticasone furoate-vilanterol (BREO ELLIPTA) 100-25 MCG/INH 1 puff (has no administration in time range)  guaiFENesin (MUCINEX) 12 hr tablet 600 mg (600 mg Oral Given 06/05/20 0230)  levofloxacin (LEVAQUIN) IVPB 750 mg (0 mg Intravenous Stopped 06/05/20 0111)  sodium chloride 0.9 % bolus 1,000 mL (0 mLs Intravenous Stopped 06/04/20 2330)  methylPREDNISolone sodium succinate (SOLU-MEDROL) 125 mg/2 mL injection 125 mg (125 mg Intravenous Given 06/04/20 2158)  albuterol (VENTOLIN HFA) 108 (90 Base) MCG/ACT inhaler 2 puff (2 puffs Inhalation Given 06/04/20 2105)  iohexol (OMNIPAQUE) 350 MG/ML injection 100 mL (100 mLs Intravenous Contrast Given 06/04/20 2219)    Mobility walks     Focused Assessments Pulmonary Assessment Handoff:  Lung sounds: Bilateral Breath Sounds: Diminished, Clear O2 Device: Room Air        R Recommendations: See Admitting Provider Note  Report given to:   Additional Notes:

## 2020-06-06 DIAGNOSIS — E030 Congenital hypothyroidism with diffuse goiter: Secondary | ICD-10-CM

## 2020-06-06 LAB — CBC WITH DIFFERENTIAL/PLATELET
Abs Immature Granulocytes: 0.03 10*3/uL (ref 0.00–0.07)
Basophils Absolute: 0 10*3/uL (ref 0.0–0.1)
Basophils Relative: 0 %
Eosinophils Absolute: 0 10*3/uL (ref 0.0–0.5)
Eosinophils Relative: 0 %
HCT: 41.6 % (ref 39.0–52.0)
Hemoglobin: 13.9 g/dL (ref 13.0–17.0)
Immature Granulocytes: 0 %
Lymphocytes Relative: 8 %
Lymphs Abs: 0.9 10*3/uL (ref 0.7–4.0)
MCH: 31.9 pg (ref 26.0–34.0)
MCHC: 33.4 g/dL (ref 30.0–36.0)
MCV: 95.4 fL (ref 80.0–100.0)
Monocytes Absolute: 0.8 10*3/uL (ref 0.1–1.0)
Monocytes Relative: 8 %
Neutro Abs: 8.8 10*3/uL — ABNORMAL HIGH (ref 1.7–7.7)
Neutrophils Relative %: 84 %
Platelets: 238 10*3/uL (ref 150–400)
RBC: 4.36 MIL/uL (ref 4.22–5.81)
RDW: 13.7 % (ref 11.5–15.5)
WBC: 10.5 10*3/uL (ref 4.0–10.5)
nRBC: 0 % (ref 0.0–0.2)

## 2020-06-06 NOTE — Progress Notes (Addendum)
PROGRESS NOTE    [name]  ZOX:[id number] DOB: May 15, 1976 DOA: 06/04/2020 PCP: Robert Bellow, PA-C    Brief Narrative:  Patient admitted to the hospital with the working diagnosis of community-acquired pneumonia, complicated with hemoptysis.  44 year old male with past medical history for pulmonary aspergilloma, allergic bronchopulmonary aspergillosis, Mycobacterium avium infection, history of hemoptysis status post IR embolization 2016, and status post intrabronchial valve placement for hemoptysis. Patient reported several weeks of hemoptysis that initially small amounts, that progressed to several onces of bright red blood per day.  Symptoms associated with intermittent pleuritic chest pain. Patient follows up with infectious disease and pulmonary as an outpatient, he is on lifelong antifungal therapy with Cresemba, along with antimycobacterial treatment, ethambutol, azithromycin and prednisone.  On his initial physical examination blood pressure 128/67, heart rate 87, respirate 20 to 27, oxygen saturation 98%.  He had decreased breath sounds bilaterally, no wheezing, no rales, heart S1-S2, present rhythm, soft abdomen, no lower extremity edema.  Sodium 134, potassium 4.0, chloride 100, bicarb 22, glucose 124, BUN 13, creatinine 0.91, white count 12.4, hemoglobin 14.3, hematocrit 40.3, platelets 245.  SARS COVID-19 negative.  Chest x-ray with hyperinflation, biapical large bullae, positive fibrotic changes bilaterally, left lower lobe patchy infiltrate. CT chest with extensive chronic bullous disease, pulmonary scaring, some of the bullae have air-fluid levels.  Consultation, left lower lobe, right lower lobe periphery.  Positive signs of chronic pulmonary hypertension. EKG 91 bpm, normal axis, normal intervals, sinus rhythm, no ST segment or T wave changes.  Patient placed on antibiotic therapy with improvement of his symptoms, he continue to have hemoptysis but decrease in intensity. No  significant change in his Hgb and Hct.    Assessment & Plan:   Principal Problem:   Hemoptysis Active Problems:   Mycobacterium avium complex ([place])   ABPA (allergic bronchopulmonary aspergillosis) ([place])   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[facility])   Severe persistent asthma   Hypothyroidism   1. Left lower lobe community acquired pneumonia/ present on admission/ complicated with hemoptysis. Patient's oxygenation is at 97% on room air, no hemoptysis yesterday but he had this am, bloody sputum, but improved in amount compared to 48 H ago. Today with stable Hgb at 13,9 and Hct at 41,6.   Plan to continue antibiotic therapy for community acquired pneumonia with IV  ceftriaxone and po azithromycin, antitussive agents and bronchodilator  therapy.  Continue quantification of hemoptysis, if continues to improve will transition to oral antibiotic therapy and complete treatment at home. For now will need at least 24 h more of inpatient treatment.   Will request to be out of bed to chair tid with meals and ambulate in the hallway.   2. Pulmonary emphysema with diffuse bullae disease, MAC and pulmonary aspergillosis. (HIV non reactive in 2012). Continue with suppressive therapy with ethambutol and azithromycin. Patient on chronic therapy with prednisone and cresemba. No clinical signs of acute exacerbation.   Plan for outpatient follow up with ID and pulmonary.   3. Hypothyroid.  On levothyroxine.   4. Asthma. No clinical signs of acute exacerbation. On ellipta, breo  and as needed albuterol with good toleration.    Status is: Inpatient  Remains inpatient appropriate because:IV treatments appropriate due to intensity of illness or inability to take PO   Dispo: The patient is from: Home              Anticipated d/c is to: Home              Anticipated d/c  date is: 1 day              Patient currently is not medically stable to d/c.   DVT prophylaxis: scd   Code Status:   full  Family Communication:  No  family at the bedside      Antimicrobials:   Ceftriaxone/ azithromycin     Subjective: Patient is feeling better, but continue to have cough and hemoptysis, yesterday no bloody sputum, but reoccur this am with moderate amount, still less than compared to home hemoptysis events.   Objective: Vitals:   06/05/20 0911 06/05/20 1617 06/05/20 2218 06/06/20 0756  BP: 116/80 (!) 131/91 119/68 131/72  Pulse: 70 74 71 60  Resp: _0 Temp: 98 F (36.7 C) 98 F (36.7 C) 97.9 F (36.6 C) (!) 97.5 F (36.4 C)  TempSrc:  Oral Oral Oral  SpO2: 96% 100% 97% 97%  Weight:      Height:        Intake/Output Summary (Last 24 hours) at 06/06/2020 1117 Last data filed at 06/05/2020 2300 Gross per 24 hour  Intake 100 ml  Output --  Net 100 ml   Filed Weights   06/04/20 1711  Weight: 58.5 kg    Examination:   General: Not in pain or dyspnea.,  Neurology: Awake and alert, non focal  E ENT: no pallor, no icterus, oral mucosa moist Cardiovascular: No JVD. S1-S2 present, rhythmic, no gallops, rubs, or murmurs. No lower extremity edema. Pulmonary: positive breath sounds bilaterally, no wheezing, positive rales aat bases bilaterally Gastrointestinal. Abdomen with no organomegaly, non tender, no rebound or guarding Skin. No rashes Musculoskeletal: no joint deformities     Data Reviewed: I have personally reviewed following labs and imaging studies  CBC: Recent Labs  Lab 06/04/20 1735 06/05/20 0422 06/06/20 1022  WBC 12.4* 9.6 10.5  NEUTROABS  --   --  8.8*  HGB 14.3 13.7 13.9  HCT 42.3 40.5 41.6  MCV 93.2 94.0 95.4  PLT 245 205 646   Basic Metabolic Panel: Recent Labs  Lab 06/04/20 1735 06/05/20 0422  NA 134* 134*  K 4.0 3.9  CL 100 103  CO2 22 21*  GLUCOSE 124* 155*  BUN 13 10  CREATININE 0.91 0.72  CALCIUM 8.7* 8.4*   GFR: Estimated Creatinine Clearance: 95.5 mL/min (by C-G formula based on SCr of 0.72 mg/dL). Liver Function Tests: No results for input(s):  AST, ALT, ALKPHOS, BILITOT, PROT, ALBUMIN in the last 168 hours. No results for input(s): LIPASE, AMYLASE in the last 168 hours. No results for input(s): AMMONIA in the last 168 hours. Coagulation Profile: No results for input(s): INR, PROTIME in the last 168 hours. Cardiac Enzymes: No results for input(s): CKTOTAL, CKMB, CKMBINDEX, TROPONINI in the last 168 hours. BNP (last 3 results) No results for input(s): PROBNP in the last 8760 hours. HbA1C: No results for input(s): HGBA1C in the last 72 hours. CBG: No results for input(s): GLUCAP in the last 168 hours. Lipid Profile: No results for input(s): CHOL, HDL, LDLCALC, TRIG, CHOLHDL, LDLDIRECT in the last 72 hours. Thyroid Function Tests: No results for input(s): TSH, T4TOTAL, FREET4, T3FREE, THYROIDAB in the last 72 hours. Anemia Panel: No results for input(s): VITAMINB12, FOLATE, FERRITIN, TIBC, IRON, RETICCTPCT in the last 72 hours.    Radiology Studies: I have reviewed all of the imaging during this hospital visit personally     Scheduled Meds: . azithromycin  500 mg Oral Daily  . calcium-vitamin D  1  tablet Oral Daily  . chlorpheniramine-HYDROcodone  5 mL Oral Q12H  . ethambutol  1,000 mg Oral Daily  . feeding supplement (ENSURE ENLIVE)  237 mL Oral BID BM  . fluticasone furoate-vilanterol  1 puff Inhalation Daily  . Isavuconazonium Sulfate  372 mg Oral Daily  . levothyroxine  88 mcg Oral Daily  . predniSONE  5 mg Oral Q breakfast  . sodium chloride flush  3 mL Intravenous Once  . umeclidinium bromide  1 puff Inhalation Daily   Continuous Infusions: . cefTRIAXone (ROCEPHIN)  IV 1 g (06/05/20 2219)     LOS: 1 day        Lulu Hirschmann Gerome Apley, MD

## 2020-06-06 NOTE — Plan of Care (Signed)

## 2020-06-07 LAB — CBC WITH DIFFERENTIAL/PLATELET
Abs Immature Granulocytes: 0.02 10*3/uL (ref 0.00–0.07)
Basophils Absolute: 0 10*3/uL (ref 0.0–0.1)
Basophils Relative: 0 %
Eosinophils Absolute: 0 10*3/uL (ref 0.0–0.5)
Eosinophils Relative: 0 %
HCT: 39.6 % (ref 39.0–52.0)
Hemoglobin: 13.4 g/dL (ref 13.0–17.0)
Immature Granulocytes: 0 %
Lymphocytes Relative: 16 %
Lymphs Abs: 1.3 10*3/uL (ref 0.7–4.0)
MCH: 32.4 pg (ref 26.0–34.0)
MCHC: 33.8 g/dL (ref 30.0–36.0)
MCV: 95.7 fL (ref 80.0–100.0)
Monocytes Absolute: 0.8 10*3/uL (ref 0.1–1.0)
Monocytes Relative: 9 %
Neutro Abs: 6 10*3/uL (ref 1.7–7.7)
Neutrophils Relative %: 75 %
Platelets: 238 10*3/uL (ref 150–400)
RBC: 4.14 MIL/uL — ABNORMAL LOW (ref 4.22–5.81)
RDW: 13.9 % (ref 11.5–15.5)
WBC: 8.1 10*3/uL (ref 4.0–10.5)
nRBC: 0 % (ref 0.0–0.2)

## 2020-06-07 MED ORDER — AMOXICILLIN-POT CLAVULANATE 875-125 MG PO TABS: 1.0000 | ORAL_TABLET | Freq: Two times a day (BID) | ORAL | 0 refills | Status: AC

## 2020-06-07 MED ORDER — AMOXICILLIN-POT CLAVULANATE 875-125 MG PO TABS
1.0000 | ORAL_TABLET | Freq: Two times a day (BID) | ORAL | Status: DC
  Administered 2020-06-07: 1 via ORAL
  Filled 2020-06-07: qty 1

## 2020-06-07 MED ORDER — HYDROCOD POLST-CPM POLST ER 10-8 MG/5ML PO SUER: 5.0000 mL | Freq: Two times a day (BID) | ORAL | 0 refills | Status: DC | PRN

## 2020-06-07 NOTE — Discharge Summary (Signed)
Physician Discharge Summary  YAHYA BOLDMAN XFG:[id number] DOB: 03-04-1976 DOA: 06/04/2020  PCP: Robert Bellow, PA-C  Admit date: 06/04/2020 Discharge date: 06/07/2020  Admitted From: Home Disposition:  Home   Recommendations for Outpatient Follow-up and new medication changes:  1. Follow up with Tami Ribas PA-C in 7 days.  2. Continue taking Augmentin for 5 more days.  3. Added as needed Tusinex.  4. Continue bronchodilator therapy   Home Health: no   Equipment/Devices: no    Discharge Condition: stable  CODE STATUS: full  Diet recommendation: regular.   Brief/Interim Summary: Patient was admitted to the hospitalwith theworking diagnosis of community-acquired pneumonia, complicated with hemoptysis.  44 year old male with past medical history for pulmonary aspergilloma, allergic bronchopulmonary aspergillosis, Mycobacterium avium infection, history of hemoptysis status post IR embolization 2016,andstatus post intrabronchial valve placement for hemoptysis. Patient reported several weeks of hemoptysis that initially small amounts, that progressed to severaloncesof bright red blood per day. Symptoms associated with intermittent pleuritic chest pain. Patient follows up with infectious disease and pulmonary as an outpatient, he is on lifelong antifungal therapy withCresemba,along with antimycobacterial treatment, ethambutol, azithromycin and prednisone. On his initial physical examination blood pressure 128/67, heart rate 87, respiratory rate 20to27, oxygen saturation 98%. He had decreased breath sounds bilaterally, no wheezing, no rales, heart S1-S2, present rhythm, soft abdomen, no lower extremity edema. Sodium 134, potassium 4.0, chloride 100, bicarb 22, glucose 124, BUN 13, creatinine 0.91, white count 12.4, hemoglobin 14.3, hematocrit 40.3, platelets 245. SARS COVID-19 negative. Chest x-ray with hyperinflation, biapical large bullae, positive fibrotic changes bilaterally,  left lower lobe patchy infiltrate. CT chest with extensive chronic bullous disease, pulmonary scaring, some of the bullae have air-fluid levels. Consolidation, left lower lobe, right lower lobe at the periphery. Positive signs of chronicpulmonary hypertension. No signs of acute pulmonary embolism. Satisfactory opacification the pulmonary arteries to the segmental level.  EKG 91 bpm, normal axis, normal intervals, sinus rhythm, no ST segment or T wave changes.  Patient placed on antibiotic therapy with improvement of his symptoms, he continue to have hemoptysis but decrease in intensity. No significant change in his Hgb and Hct.   1.  Left lower lobe community-acquired pneumonia, present on admission, complicated by hemoptysis.  Patient was admitted to the telemetry ward, he received supplemental oxygen, bronchodilator therapy, antitussive agents and intravenous antibiotics. Patient symptoms including hemoptysis improved, his oxygen saturation at discharge is 95% on room air.  Patient will follow up with pulmonary and infectious disease as scheduled.   2.  Pulmonary emphysema with diffuse bullae disease, MAC and pulmonary aspergillosis.  Patient remained stable, she was continued on ethambutol and azithromycin, prednisone and Cresemba with no major complications. Patient will follow up with infectious disease as scheduled.  3.  Hypothyroidism.  Continue levothyroxine.  4.  Asthma.  No clinical signs of exacerbation, patient will continue bronchodilator therapy, inhaled corticosteroids/ LABA and LAMA.   5.  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  No signs of acute pulmonary embolism.  Patient has significant morphologic changes due to emphysema and bullous disease that can account for subsegmental and segmental occlusion. Patient symptoms have improved with antibiotic therapy, no further hypoxemia, will recommend follow-up as an  outpatient.  Considering patient's history of hemoptysis likely anticoagulation will be detrimental.   Discharge Diagnoses:  Principal Problem:   Hemoptysis Active Problems:   Mycobacterium avium complex ([place])   ABPA (allergic bronchopulmonary aspergillosis) ([place])   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[facility])   Severe persistent asthma  Hypothyroidism    Discharge Instructions   Allergies as of 06/07/2020      Reactions   Clarithromycin Other (See Comments)   Adverse reaction w/ voriconazole UNSPECIFIED REACTION    Rifaximin Other (See Comments)   Adverse reaction w/ voriconazole UNSPECIFIED REACTION       Medication List    TAKE these medications   albuterol 108 (90 Base) MCG/ACT inhaler Commonly known as: VENTOLIN HFA Inhale 2 puffs into the lungs every 6 (six) hours as needed. What changed: reasons to take this   amoxicillin-clavulanate 875-125 MG tablet Commonly known as: AUGMENTIN Take 1 tablet by mouth every 12 (twelve) hours for 5 days.   azithromycin 500 MG tablet Commonly known as: ZITHROMAX Take 1 tablet (500 mg total) by mouth daily.   Breo Ellipta 100-25 MCG/INH Aepb Generic drug: fluticasone furoate-vilanterol Inhale 1 puff into the lungs daily.   Calcium Carbonate-Vitamin D 600-400 MG-UNIT tablet Take 1 tablet by mouth daily.   chlorpheniramine-HYDROcodone 10-8 MG/5ML Suer Commonly known as: TUSSIONEX Take 5 mLs by mouth every 12 (twelve) hours as needed for cough.   Cresemba 186 MG Caps Generic drug: Isavuconazonium Sulfate TAKE 2 CAPSULES BY MOUTH DAILY What changed: how much to take   EPINEPHrine 0.3 mg/0.3 mL Soaj injection Commonly known as: EPI-PEN Inject 0.3 mLs (0.3 mg total) into the muscle once for 1 dose.   ethambutol 400 MG tablet Commonly known as: MYAMBUTOL TAKE 2& 1/2 TABLETS BY MOUTH ONCE DAILY What changed:   how much to take  how to take this  when to take this  additional instructions   feeding supplement (ENSURE ENLIVE) Liqd Take  237 mLs by mouth 3 (three) times daily between meals. What changed: when to take this   guaiFENesin 600 MG 12 hr tablet Commonly known as: MUCINEX Take 1 tablet (600 mg total) by mouth 2 (two) times daily.   levothyroxine 88 MCG tablet Commonly known as: SYNTHROID Take 88 mcg by mouth daily.   predniSONE 5 MG tablet Commonly known as: DELTASONE Take 1 tablet (5 mg total) by mouth daily with breakfast. What changed: Another medication with the same name was removed. Continue taking this medication, and follow the directions you see here.   Spiriva Respimat 2.5 MCG/ACT Aers Generic drug: Tiotropium Bromide Monohydrate INHALE 2 SPRAY(S) BY MOUTH ONCE DAILY What changed: See the new instructions.       Allergies  Allergen Reactions  . Clarithromycin Other (See Comments)    Adverse reaction w/ voriconazole UNSPECIFIED REACTION   . Rifaximin Other (See Comments)    Adverse reaction w/ voriconazole UNSPECIFIED REACTION        Procedures/Studies: DG Chest 2 View  Result Date: 06/04/2020 CLINICAL DATA:  Hemoptysis for 2 weeks. Decreased oxygen saturation. EXAM: CHEST - 2 VIEW COMPARISON:  PA and lateral chest 12/16/2018. FINDINGS: Severe bullous emphysema is again seen. There is a new focus of airspace disease in the lingula. Trace right pleural effusion is decreased compared to the prior exam. No pneumothorax. Heart size is normal. No acute bony abnormality. Remote midthoracic compression fracture noted. IMPRESSION: New focus of patchy airspace disease in the lingula worrisome for pneumonia superimposed on severe bullous emphysema. Electronically Signed   By: Inge Rise M.D.   On: 06/04/2020 17:46   CT Angio Chest PE W and/or Wo Contrast  Result Date: 06/04/2020 CLINICAL DATA:  Shortness of breath, 2 weeks of hemoptysis EXAM: CT ANGIOGRAPHY CHEST WITH CONTRAST TECHNIQUE: Multidetector CT imaging of the chest was performed  using the standard protocol during bolus  administration of intravenous contrast. Multiplanar CT image reconstructions and MIPs were obtained to evaluate the vascular anatomy. CONTRAST:  141m OMNIPAQUE IOHEXOL 350 MG/ML SOLN COMPARISON:  CT 09/04/2018 FINDINGS: Cardiovascular: Satisfactory opacification the pulmonary arteries to the segmental level. Chronic narrowing and occlusion several right upper lobe segmental and subsegmental pulmonary arteries (coronal MIP 10/76-78). Additional filling defects present in the left upper lobe segmental and subsegmental branches as well. These are both within regions of extensive bullous disease, architectural distortion and surgical change and the peripherally marginated appearance is most suggestive of chronic embolus/occlusion. No other pulmonary artery emboli are identified. While the RV/LV ratio is increased (1.15) the intraventricular septum remains appropriately convexed and there is associated right atrial enlargement with reflux of contrast which could suggest more longstanding right heart enlargement and chronic pulmonary artery hypertension. Suboptimal opacification of the thoracic aorta. No gross luminal abnormality. Atherosclerotic plaque within the normal caliber aorta. Normal 3 vessel branching of the aortic arch. Proximal great vessels are unremarkable. No major venous abnormalities. Mediastinum/Nodes: No mediastinal fluid or gas. Normal thyroid gland and thoracic inlet. No acute abnormality of the trachea or esophagus. No worrisome mediastinal, hilar or axillary adenopathy. Lungs/Pleura: Extensive chronic bullous paraseptal predominant emphysematous changes. Scattered areas of thick walled cystic bulla towards the apices with some peripheral nodularity, including a recess of cystic change demonstrating layering air-fluid level. There are mixed areas of ground-glass and consolidative opacity in the left lower and right lower lobe periphery. Some associated airways thickening and scattered secretions are  noted. Numerous calcifications may reflect sequela of prior granulomatous disease. Upper Abdomen: Calcified gallstone within the otherwise normal gallbladder. Fluid attenuation cyst seen in the upper pole left kidney. No acute abnormalities present in the visualized portions of the upper abdomen. Musculoskeletal: Multilevel degenerative changes are present in the imaged portions of the spine. Exaggerated thoracic kyphosis is similar to prior. No acute chest wall abnormality. Cachectic appearance with paucity of subcutaneous fat. Moderate bilateral gynecomastia. Review of the MIP images confirms the above findings. IMPRESSION: 1. 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 Additional filling defects present in the left upper lobe segmental and subsegmental branches as well. These are both within regions of extensive bullous disease, architectural distortion and surgical change and the peripherally marginated appearance is most suggestive of chronic embolus/occlusion. 2. While the RV/LV ratio is increased (1.15) the intraventricular septum remains appropriately convex and there is associated right atrial enlargement with reflux of contrast which could suggest more longstanding right heart enlargement and chronic pulmonary artery hypertension rather than acute right heart strain though if there is clinical concern, echocardiography could be performed for more definitive assessment. 3. Extensive chronic bullous paraseptal predominant emphysematous changes. Scattered areas of thick walled cystic bulla towards the apices with some peripheral nodularity and layering air-fluid level within the left apical bulla which could reflect a persisting fungal or atypical superinfection. Certainly could present with hemoptysis. 4. Further mixed areas of ground-glass and consolidative opacity in the left lower and right lower lobe periphery, also likely infectious or  inflammatory with the differential including potential atypical viral infection such as COVID-19. 5. Cholelithiasis. 6. Moderate bilateral gynecomastia. 7. Paucity of subcutaneous fat.  Correlate with nutritional status. 8. Aortic Atherosclerosis (ICD10-I70.0). 9. Emphysema (ICD10-J43.9). These results were called by telephone at the time of interpretation on 06/04/2020 at 10:50 pm to provider DAVID YAO , who verbally acknowledged these results. Electronically Signed   By: PMarch Rummage  [facility] M.D.   On: 06/04/2020 22:50       Subjective: Patient is feeling better, no dyspnea and only one episode of small hemoptysis since yesterday. No nausea or vomiting, no chest pain.   Discharge Exam: Vitals:   06/07/20 0750 06/07/20 0755  BP: 133/79   Pulse: 71   Resp: 17   Temp: 97.8 F (36.6 C)   SpO2: 97% 95%   Vitals:   06/06/20 1616 06/06/20 2232 06/07/20 0750 06/07/20 0755  BP: 102/67 114/78 133/79   Pulse: 68 67 71   Resp:   17   Temp: 97.6 F (36.4 C) 98.3 F (36.8 C) 97.8 F (36.6 C)   TempSrc: Oral Oral Oral   SpO2: 96% 97% 97% 95%  Weight:      Height:        General: Not in pain or dyspnea  Neurology: Awake and alert, non focal  E ENT: no pallor, no icterus, oral mucosa moist Cardiovascular: No JVD. S1-S2 present, rhythmic, no gallops, rubs, or murmurs. No lower extremity edema. Pulmonary: positive breath sounds bilaterally,  no wheezing,or  rhonchi positive bilateral rales predominately at bases.  Gastrointestinal. Abdomen soft and non tender. Skin. No rashes Musculoskeletal: no joint deformities   The results of significant diagnostics from this hospitalization (including imaging, microbiology, ancillary and laboratory) are listed below for reference.     Microbiology: Recent Results (from the past 240 hour(s))  Blood culture (routine x 2)     Status: None (Preliminary result)   Collection Time: 06/04/20  9:23 PM   Specimen: BLOOD  Result Value Ref Range Status   Specimen  Description BLOOD RIGHT ANTECUBITAL  Final   Special Requests   Final    BOTTLES DRAWN AEROBIC AND ANAEROBIC Blood Culture adequate volume   Culture   Final    NO GROWTH 3 DAYS Performed at [facility], [address] [place], [place] [address]    Report Status PENDING  Incomplete  Blood culture (routine x 2)     Status: None (Preliminary result)   Collection Time: 06/04/20  9:23 PM   Specimen: BLOOD  Result Value Ref Range Status   Specimen Description BLOOD LEFT ANTECUBITAL  Final   Special Requests   Final    BOTTLES DRAWN AEROBIC AND ANAEROBIC Blood Culture results may not be optimal due to an inadequate volume of blood received in culture bottles   Culture   Final    NO GROWTH 3 DAYS Performed at [facility], [place] [address] [place], [place] [address]    Report Status PENDING  Incomplete  SARS Coronavirus 2 by RT PCR (hospital order, performed in [facility] hospital lab) Nasopharyngeal Nasopharyngeal Swab     Status: None   Collection Time: 06/04/20 10:12 PM   Specimen: Nasopharyngeal Swab  Result Value Ref Range Status   SARS Coronavirus 2 NEGATIVE NEGATIVE Final    Comment: (NOTE) SARS-CoV-2 target nucleic acids are NOT DETECTED.  The SARS-CoV-2 RNA is generally detectable in upper and lower respiratory specimens during the acute phase of infection. The lowest concentration of SARS-CoV-2 viral copies this assay can detect is 250 copies / mL. A negative result does not preclude SARS-CoV-2 infection and should not be used as the sole basis for treatment or other patient management decisions.  A negative result may occur with improper specimen collection / handling, submission of specimen other than nasopharyngeal swab, presence of viral mutation(s) within the areas targeted by this assay, and inadequate  number of viral copies (<250 copies / mL). A negative result must be combined with clinical observations, patient history, and epidemiological  information.  Fact Sheet for Patients:   StrictlyIdeas.no  Fact Sheet for Healthcare Providers: BankingDealers.co.za  This test is not yet approved or  cleared by the [place] FDA and has been authorized for detection and/or diagnosis of SARS-CoV-2 by FDA under an Emergency Use Authorization (EUA).  This EUA will remain in effect (meaning this test can be used) for the duration of the COVID-19 declaration under Section 564(b)(1) of the Act, 21 U.S.C. section 360bbb-3(b)(1), unless the authorization is terminated or revoked sooner.  Performed at [facility], [place] [address] [place], [place] [address]   Expectorated sputum assessment w rflx to resp cult     Status: None   Collection Time: 06/05/20  2:55 AM   Specimen: Sputum  Result Value Ref Range Status   Specimen Description SPUTUM  Final   Special Requests NONE  Final   Sputum evaluation   Final    THIS SPECIMEN IS ACCEPTABLE FOR SPUTUM CULTURE Performed at [facility], [place] [address], [place], [place] [address]    Report Status 06/05/2020 FINAL  Final  Culture, respiratory     Status: None (Preliminary result)   Collection Time: 06/05/20  2:55 AM   Specimen: SPU  Result Value Ref Range Status   Specimen Description SPUTUM  Final   Special Requests NONE Reflexed from [id number]  Final   Gram Stain   Final    MODERATE WBC PRESENT, PREDOMINANTLY PMN MODERATE GRAM POSITIVE COCCI IN PAIRS IN CHAINS FEW GRAM NEGATIVE RODS    Culture   Final    CULTURE REINCUBATED FOR BETTER GROWTH Performed at [facility], [place] [address], [place], [place] [address]    Report Status PENDING  Incomplete     Labs: BNP (last 3 results) No results for input(s): BNP in the last 8760 hours. Basic Metabolic Panel: Recent Labs  Lab 06/04/20 1735 06/05/20 0422  NA 134* 134*  K 4.0 3.9  CL 100 103  CO2 22 21*  GLUCOSE 124* 155*  BUN 13 10  CREATININE 0.91 0.72   CALCIUM 8.7* 8.4*   Liver Function Tests: No results for input(s): AST, ALT, ALKPHOS, BILITOT, PROT, ALBUMIN in the last 168 hours. No results for input(s): LIPASE, AMYLASE in the last 168 hours. No results for input(s): AMMONIA in the last 168 hours. CBC: Recent Labs  Lab 06/04/20 1735 06/05/20 0422 06/06/20 1022 06/07/20 0312  WBC 12.4* 9.6 10.5 8.1  NEUTROABS  --   --  8.8* 6.0  HGB 14.3 13.7 13.9 13.4  HCT 42.3 40.5 41.6 39.6  MCV 93.2 94.0 95.4 95.7  PLT 245 205 238 238   Cardiac Enzymes: No results for input(s): CKTOTAL, CKMB, CKMBINDEX, TROPONINI in the last 168 hours. BNP: Invalid input(s): POCBNP CBG: No results for input(s): GLUCAP in the last 168 hours. D-Dimer No results for input(s): DDIMER in the last 72 hours. Hgb A1c No results for input(s): HGBA1C in the last 72 hours. Lipid Profile No results for input(s): CHOL, HDL, LDLCALC, TRIG, CHOLHDL, LDLDIRECT in the last 72 hours. Thyroid function studies No results for input(s): TSH, T4TOTAL, T3FREE, THYROIDAB in the last 72 hours.  Invalid input(s): FREET3 Anemia work up No results for input(s): VITAMINB12, FOLATE, FERRITIN, TIBC, IRON, RETICCTPCT in the last 72 hours. Urinalysis    Component Value Date/Time   COLORURINE YELLOW 09/11/2018 1714  APPEARANCEUR CLEAR 09/11/2018 1714   LABSPEC 1.016 09/11/2018 1714   PHURINE 6.0 09/11/2018 1714   GLUCOSEU NEGATIVE 09/11/2018 1714   HGBUR NEGATIVE 09/11/2018 1714   BILIRUBINUR NEGATIVE 09/11/2018 1714   KETONESUR NEGATIVE 09/11/2018 1714   PROTEINUR NEGATIVE 09/11/2018 1714   UROBILINOGEN 0.2 05/28/2011 0943   NITRITE NEGATIVE 09/11/2018 1714   LEUKOCYTESUR NEGATIVE 09/11/2018 1714   Sepsis Labs Invalid input(s): PROCALCITONIN,  WBC,  LACTICIDVEN Microbiology Recent Results (from the past 240 hour(s))  Blood culture (routine x 2)     Status: None (Preliminary result)   Collection Time: 06/04/20  9:23 PM   Specimen: BLOOD  Result Value Ref Range  Status   Specimen Description BLOOD RIGHT ANTECUBITAL  Final   Special Requests   Final    BOTTLES DRAWN AEROBIC AND ANAEROBIC Blood Culture adequate volume   Culture   Final    NO GROWTH 3 DAYS Performed at [facility], [place] [address] [place], [place] [address]    Report Status PENDING  Incomplete  Blood culture (routine x 2)     Status: None (Preliminary result)   Collection Time: 06/04/20  9:23 PM   Specimen: BLOOD  Result Value Ref Range Status   Specimen Description BLOOD LEFT ANTECUBITAL  Final   Special Requests   Final    BOTTLES DRAWN AEROBIC AND ANAEROBIC Blood Culture results may not be optimal due to an inadequate volume of blood received in culture bottles   Culture   Final    NO GROWTH 3 DAYS Performed at [facility], [place] [address], [place], [place] [address]    Report Status PENDING  Incomplete  SARS Coronavirus 2 by RT PCR (hospital order, performed in [facility] hospital lab) Nasopharyngeal Nasopharyngeal Swab     Status: None   Collection Time: 06/04/20 10:12 PM   Specimen: Nasopharyngeal Swab  Result Value Ref Range Status   SARS Coronavirus 2 NEGATIVE NEGATIVE Final    Comment: (NOTE) SARS-CoV-2 target nucleic acids are NOT DETECTED.  The SARS-CoV-2 RNA is generally detectable in upper and lower respiratory specimens during the acute phase of infection. The lowest concentration of SARS-CoV-2 viral copies this assay can detect is 250 copies / mL. A negative result does not preclude SARS-CoV-2 infection and should not be used as the sole basis for treatment or other patient management decisions.  A negative result may occur with improper specimen collection / handling, submission of specimen other than nasopharyngeal swab, presence of viral mutation(s) within the areas targeted by this assay, and inadequate number of viral copies (<250 copies / mL). A negative result must be combined with clinical observations, patient history, and  epidemiological information.  Fact Sheet for Patients:   StrictlyIdeas.no  Fact Sheet for Healthcare Providers: BankingDealers.co.za  This test is not yet approved or  cleared by the [place] FDA and has been authorized for detection and/or diagnosis of SARS-CoV-2 by FDA under an Emergency Use Authorization (EUA).  This EUA will remain in effect (meaning this test can be used) for the duration of the COVID-19 declaration under Section 564(b)(1) of the Act, 21 U.S.C. section 360bbb-3(b)(1), unless the authorization is terminated or revoked sooner.  Performed at [facility], [place] [address] [place], [place] [address]   Expectorated sputum assessment w rflx to resp cult     Status: None   Collection Time: 06/05/20  2:55 AM   Specimen: Sputum  Result Value Ref Range Status   Specimen Description  SPUTUM  Final   Special Requests NONE  Final   Sputum evaluation   Final    THIS SPECIMEN IS ACCEPTABLE FOR SPUTUM CULTURE Performed at [facility], [address] [place], [place] [address]    Report Status 06/05/2020 FINAL  Final  Culture, respiratory     Status: None (Preliminary result)   Collection Time: 06/05/20  2:55 AM   Specimen: SPU  Result Value Ref Range Status   Specimen Description SPUTUM  Final   Special Requests NONE Reflexed from [id number]  Final   Gram Stain   Final    MODERATE WBC PRESENT, PREDOMINANTLY PMN MODERATE GRAM POSITIVE COCCI IN PAIRS IN CHAINS FEW GRAM NEGATIVE RODS    Culture   Final    CULTURE REINCUBATED FOR BETTER GROWTH Performed at [facility], [place] [address] [place], [place] [address]    Report Status PENDING  Incomplete     Time coordinating discharge: 45 minutes  SIGNED:   Tawni Millers, MD  [facility] 06/07/2020, 10:22 AM

## 2020-06-08 ENCOUNTER — Ambulatory Visit (INDEPENDENT_AMBULATORY_CARE_PROVIDER_SITE_OTHER): Payer: BC Managed Care – PPO

## 2020-06-08 ENCOUNTER — Encounter: Payer: Self-pay | Admitting: Internal Medicine

## 2020-06-08 ENCOUNTER — Other Ambulatory Visit: Payer: Self-pay

## 2020-06-08 ENCOUNTER — Ambulatory Visit: Payer: BC Managed Care – PPO | Admitting: Internal Medicine

## 2020-06-08 VITALS — BP 114/70 | HR 75 | Temp 98.5°F | Ht 68.0 in | Wt 126.6 lb

## 2020-06-08 DIAGNOSIS — A310 Pulmonary mycobacterial infection: Secondary | ICD-10-CM

## 2020-06-08 DIAGNOSIS — B4481 Allergic bronchopulmonary aspergillosis: Secondary | ICD-10-CM | POA: Diagnosis not present

## 2020-06-08 DIAGNOSIS — R768 Other specified abnormal immunological findings in serum: Secondary | ICD-10-CM | POA: Diagnosis not present

## 2020-06-08 DIAGNOSIS — B449 Aspergillosis, unspecified: Secondary | ICD-10-CM

## 2020-06-08 DIAGNOSIS — E033 Postinfectious hypothyroidism: Secondary | ICD-10-CM | POA: Diagnosis not present

## 2020-06-08 DIAGNOSIS — J4550 Severe persistent asthma, uncomplicated: Secondary | ICD-10-CM

## 2020-06-08 LAB — CULTURE, RESPIRATORY W GRAM STAIN

## 2020-06-08 MED ORDER — BENRALIZUMAB 30 MG/ML ~~LOC~~ SOSY
30.0000 mg | PREFILLED_SYRINGE | Freq: Once | SUBCUTANEOUS | Status: AC
  Administered 2020-06-08: 30 mg via SUBCUTANEOUS

## 2020-06-08 MED ORDER — BREZTRI AEROSPHERE 160-9-4.8 MCG/ACT IN AERO: 2.0000 | INHALATION_SPRAY | Freq: Two times a day (BID) | RESPIRATORY_TRACT | 0 refills | Status: DC

## 2020-06-08 NOTE — Patient Instructions (Signed)
ICD-10-CM   1. Severe persistent asthma without complication  O27.74   2. ABPA (allergic bronchopulmonary aspergillosis) ([place])  B44.81   3. Elevated IgE level  R76.8   4. 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[place])  B44.9   5. 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

## 2020-06-08 NOTE — Progress Notes (Signed)
Have you been hospitalized within the last 10 days?  No Do you have a fever?  No Do you have a cough?  No Do you have a headache or sore throat? No Do you have your Epi Pen visible and is it within date?  Yes 

## 2020-06-08 NOTE — Progress Notes (Signed)
male never smoker followed for ABPA , MAC and Aspergilloma .  Patient has a history of aspergilloma , status post VATS in 2012. 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 Followed at [facility] for MAC .    TEST  1.7/13 Ig E 800s 12/10/11 Sputum AFB  Smear and culture negative (prelim) 12/10/11 Fungal sputum culture - negative (final) 01/30/2012 spirometry - fev1 1.7L/41%, ratip 52 - BEST EVER 01/30/2012 walking desaturation test: 185 feet x 3 laps: did not deaturate 05/2014 >Spiro fev1 1.7L/40%, ratio 55  Spirometry 06/25/2017 showed FEV1 at 28%, ratio 46, FVC 50%.  Events  2010 Dx with MAI >Azithro/Ethambutol 2012 Dx with Aspergilloma >VATS >started on Voriconazaol  2012 Dx w/ ABPA w/ High IgE >4000>started on prednisone  2013 tried off azithr/Etham but developed fever, Cx neg but restarted on rx.  2016 Hospitalzed with hemoptysis >IR guided embolization . FOB/BAL + Saccharomyces on fungal fx . Serum Galactomannan was neg.  [name] evaluation 04/2017 for Transplant -rejected. Felt to early for transplant - recommended pre-transplant weight goal 128 lbs for a BMI > 19          HPI   OV 01/16/2016  Chief Complaint  Patient presents with  . Follow-up    Pt denies any increased dyspnea at this time - states that it comes and goes with activity. Pt states it is different on a daily basis. Denies hemoptysis since last OV.   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 Since then he overall feels that his dyspnea is not back to baseline. He feels he has had a new lower baseline. He has more fatigued than usual. But he is stable without any exacerbation. He feels in the next few years ago started to become permanently disabled. He currently works as a cBiomedical scientist There are no fevers.   Last chest x-ray 11/07/2015: Shows hyperinflation personally  visualized Last IgE January 2013 was 841 and significantly improved from 4000 2012.  Last CBC January 2017 with normal used to flows are 100 cells per cubic millimeter.   OV 08/27/2016  Chief Complaint  Patient presents with  . Follow-up    Pt states his cough and SOB is at baseline. Pt states he has hemoptysis around once a week at most - penny size of mucus. Pt denies CP/tightness and f/c/s.      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 He status post IR guided embolization of hemoptysis end 2016.   Last visit feb 2017 he was reporting worsening dyspnea. We checked his IgE. Improved significantly but it was still in the 700s. We started Xolair therapy. He's been doing this now for a few months. He says this has helped his dyspnea symptoms significantly. However at end of 2 weeks he starts getting more symptomatic. Overall he is stable. 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 There are no fevers. Here, function test today FEV1 is 1.65 L/42% postbronchodilator. This is similar to 2014 but improved compared to 2015. Off note he tells me that he is mild hemoptysis once a month this is stable. He says this is actually been going on since November 2016 when he had his embolization. This no change in this. He knows that he has to monitor his hemoptysis  volume. He will have his flu shot today  Last chest x-ray 11/07/2015: Shows hyperinflation personally visualized Last IgE FEb 2017 was 717 and started xolair (January 2013 was 841 and significantly improved from 4000 in  2012)   OV 03/18/2017  Chief Complaint  Patient presents with  . Follow-up    6 mo f/u. Breathing has been the same since the last visit. Denies any increased SOB or chest pains.    Follow-up severe persistent asthma with ABPA and aspergilloma with MAI infection. He status post IR guided embolization for hemoptysis and end of  2016   Overall he has done well. However he says that at his job at [employer] they have been working hard and he is mostly fatigue with very closely. Recently been only sleeping 3 hours per day because of the work. Then in mid April 2018 he have one episode of hemoptysis while at work. With small amount that lasted few to several minutes. It did scare him but he decided to hold off against going to the emergency department. Since then he's not had any recurrence. He feels stressed and job stress is contributing to this. Most recent CT scan chest as in December 2017 that shows stability documented below. There are no other new issues. He is open to having a transplant referral.   OV 01/27/2018  Chief Complaint  Patient presents with  . Follow-up    Pt states he has been doing good. Denies any complaints or concerns.     Follow-up severe persistent asthma with ABPA on prednisone 39m daily, spiriva, breo and Aspergilloma with MAI infection. He status post IR guided embolization of hemoptysis end 2016 and on Xolair since early 2017   Overall doing well. 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 Symptoms score is low ; 12 CAT score. Compliant with Rx. No hemoptysis. Reviewed prior labs - he says on daily prednisone since 2012. And prior to that has had significant eosinophilia and also break through eos since then (see below). Unable to come off prednisone completely. XKathreen Devoidhas helped but not fully. 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 , MRN: [id number], ADDRESS: [address] [place] [address]  12/17/2018 -   Chief Complaint  Patient presents with  . Follow-up    Pt states he was in the hosp Oct and Nov 2019. Pt states he is still not back to his self but is taking  one day at a time. Due to the recent hospitalization is why pt is here for today's appt.     Follow-up severe persistent asthma with ABPA on prednisone 535mdaily, spiriva, breo and Aspergilloma with MAI infection. He status post IR guided embolization of hemoptysis end 2016 and on Xolair since early 2017 bu tstopped since spring 2019 due to insurance issues    HPI Brett RADEMAKER89.o. -returns for follow-up.  I last saw him in the spring 2019.  After that in September 2019 he saw my nurse practitioner.  Then mid October 2019 he was admitted with acute right-sided tension pneumothorax [personally visualized the CT chest and confirmed the findings today].  After that he had a 5-week hospital stay.  This included failed chest tube management and pleurodesis.  Finally he  required endobronchial valves by Dr. Roxan Hockey.  These were also finally removed a few weeks ago.  He saw Dr. Roxan Hockey yesterday.  Chest x-ray follow-up yesterday that I visualized the lungs look expanded.  He is also seen [name] of infectious diseases yesterday and is been asked to continue his MAC and ABPA antifungal therapy.  I did notice that he is not taking Xolair anymore since the spring 2019.  He says is due to insurance issues.  Today he tells me that overall with all these admissions he has been left completely fatigued, lost weight and worn out.  He is now worried about his life expectancy.  At the same time he wants to go on work.  He wants to know if he should apply for disability.  We have had these conversations in the past.  We discussed his life philosophy.  He tells me that if he did not work he would feel that he is lazy and it is not compatible with this personal value system.  I did indicate to him that this is to be completely respected but his medical condition is that he would qualify for disability.  Therefore it is a personal choice he will have to make.  He is applied for Social Security disability through  the hospital social worker in November 2019.  The decision is pending any time now.  He is frustrated that is not able to work 80 hours a day.  He is wondering if he can even work 40 hours a day.  I have only advised him that he should look at complete disability.  If at all he wants to work he can look at a desk job.  Definitely should avoid jobs that heavy workload in the kitchen that involve carrying things or even sneezing or even doing pulmonary function test anymore because given his propensity for pneumothorax any rise in intrathoracic pressure would put him at risk for pneumothorax or other complications.  Other than this in terms of his lung health he is feeling stable at this point.  He is not taking Xolair he stopped this in the spring 2019.  This means is continued IgE destruction of the lungs.  We discussed this and he is willing to look into restarting this  Of note, he also wants me to check his blood work for [name].  I reviewed Dr. Arlyss Queen note.  This is indeed correct because yesterday [facility] medical record system was down.    OV 10/28/2019  Subjective:  Patient ID: [name], male , DOB: 1975-12-27 , age 1 y.o. , MRN: [id number] , ADDRESS: [address] [place] [address]   10/28/2019 -   Chief Complaint  Patient presents with  . Follow-up    Pt states he has been doing okay since last visit. States he has good and bad days with his breathing and also has an occ cough.      Follow-up severe persistent asthma with ABPA on prednisone 69m daily, spiriva, breo and Aspergilloma with MAI infection. He status post IR guided embolization of hemoptysis end 2016 and on Xolair since early 2017 bu tstopped since spring 2019 due to insurance issues. FASENRA since April/May 2020  HPI Brett GILLESPIE89y.o. -routine follow-up last seen in January 2020.  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  He will not lift more than 15 pounds because of his pneumothorax risk and dyspnea.  He is happy now that he has employed based insurance.  He did apply for disability but did not qualify because he did not desaturate with a 6-minute walk test.  This is despite him having severe obstructive lung disease.  He says he is reconciled and he likes to work because it gives him purpose.  He has had his flu shot.  He is now taking [name] since spring 2020 and this seems to be working well for him.  He takes it every 8 weeks.  He has not had any exacerbations.  He prefers to avoid pulmonary function test because of the pneumothorax risk.  There are no other new issues.     CAT Symptom & Quality of Life Score (GSK trademark) 0 is no burden. 5 is highest burden 01/27/2018   Never Cough -> Cough all the time 3  No phlegm in chest -> Chest is full of phlegm 2  No chest tightness -> Chest feels very tight 1  No dyspnea for 1 flight stairs/hill -> Very dyspneic for 1 flight of stairs 3  No limitations for ADL at home -> Very limited with ADL at home 1  Confident leaving home -> Not at all confident leaving home 0  Sleep soundly -> Do not sleep soundly because of lung condition 1  Lots of Energy -> No energy at all 1  TOTAL Score (max 40)  12    Results for SHAUNAK, KREIS (MRN [id number]) as of 01/27/2018 09:12  Ref. Range 08/06/2016 09:09 06/25/2017 09:11  FEV1-Pre Latest Units: L 1.50 1.16  FEV1-%Pred-Pre Latest Units: % 39 28  Pre FEV1/FVC ratio Latest Units: % 53 46     Results for TREY, GULBRANSON (MRN [id number]) as of 01/27/2018 09:12  Ref. Range 12/16/2010 18:25 12/17/2010 03:45 12/17/2010 12:38 12/18/2010 04:25 12/19/2010 04:00 12/20/2010 04:04 12/21/2010 03:37 12/22/2010 06:42 12/22/2010 15:22 12/23/2010 04:22 12/24/2010 03:30 12/25/2010 04:19 12/27/2010 04:00 03/05/2011 18:15 03/06/2011 09:08 03/07/2011 09:18 03/09/2011 05:10 03/11/2011  04:54 04/02/2011 09:41 05/28/2011 09:42 11/26/2011 17:14 12/10/2011 10:21 12/10/2011 10:21 03/13/2012 16:54 09/22/2012 10:46 11/23/2013 09:46 07/05/2014 09:58 02/16/2015 10:16 08/08/2015 10:04 10/13/2015 04:00 10/13/2015 04:20 10/14/2015 02:28 10/15/2015 04:44 12/05/2015 12:16 01/16/2016 16:16 04/09/2016 09:43 08/09/2017 10:48 08/10/2017 10:46  Eosinophils Absolute Latest Ref Range: 0.0 - 0.7 K/uL 0.3 0.1  0.4          0.0 0.0 0.1   0.1 0.0 0.0    0.1 0.1 0.1 0.1 0.0 0.1   0.1 0.1 0.0 196 0.0 0.0   Results for DEMARKO, ZEIMET (MRN [id number]) as of 01/27/2018 09:12  Ref. Range 03/07/2011 09:18 03/08/2011 16:23 11/26/2011 17:14 01/16/2016 16:16 10/28/2019   IgE (Immunoglobulin E), Serum Latest Ref Range: <115 kU/L 4185.2 Result con... (H) 4744.6.Marland Kitchen. (H) 841.8 (H) 717 (H) 537      OV 06/09/2020  Subjective:  Patient ID: [name], male , DOB: 25-Nov-1975 , age 66 y.o. , MRN: [id number] , ADDRESS: [address] [place] [address]   06/09/2020 -   Chief Complaint  Patient presents with  . Hospitalization Follow-up    recent PNA- feeling weak today and low energy. He is not coughing more than usual baseline at this time- last hemoptysis 06/07/20.      HPI [name] 44 y.o. -returns for follow-up.  He was admitted  June 04, 2020 with hemoptysis over several weeks.  This is CT diagnosis of left lower lobe pneumonia according to the discharge notes.  He was discharged 1 day prior to this visit on June 15, 2020.  The CT report as below.  Currently he feels free of hemoptysis but just feels fatigued.  He still continues to work.  He is not interested in disability.  He is taking Spiriva and Breo.  He is taken a few days of work because of the post admission fatigue.  Of note his CT chest shows evidence of right heart strain.  He did not have an echocardiogram yet   IMPRESSION: 1. 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 Additional filling defects  present in the left upper lobe segmental and subsegmental branches as well. These are both within regions of extensive bullous disease, architectural distortion and surgical change and the peripherally marginated appearance is most suggestive of chronic embolus/occlusion. 2. While the RV/LV ratio is increased (1.15) the intraventricular septum remains appropriately convex and there is associated right atrial enlargement with reflux of contrast which could suggest more longstanding right heart enlargement and chronic pulmonary artery hypertension rather than acute right heart strain though if there is clinical concern, echocardiography could be performed for more definitive assessment. 3. Extensive chronic bullous paraseptal predominant emphysematous changes. Scattered areas of thick walled cystic bulla towards the apices with some peripheral nodularity and layering air-fluid level within the left apical bulla which could reflect a persisting fungal or atypical superinfection. Certainly could present with hemoptysis. 4. Further mixed areas of ground-glass and consolidative opacity in the left lower and right lower lobe periphery, also likely infectious or inflammatory with the differential including potential atypical viral infection such as COVID-19. 5. Cholelithiasis. 6. Moderate bilateral gynecomastia. 7. Paucity of subcutaneous fat.  Correlate with nutritional status. 8. Aortic Atherosclerosis (ICD10-I70.0). 9. Emphysema (ICD10-J43.9).  These results were called by telephone at the time of interpretation on 06/04/2020 at 10:50 pm to provider DAVID YAO , who verbally acknowledged these results.   Electronically Signed   By: Lovena Le M.D.   On: 06/04/2020 22:50  ROS - per HPI     has a past medical history of Anemia, 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[place]), Drug rash (12/16/2017), Dyspnea, Esophageal reflux, Hypothyroidism, Lung disease, bullous ([place]), MAI (mycobacterium  avium-intracellulare) ([place]), Mold exposure (12/05/2015), Photosensitivity dermatitis (09/16/2017), Solar lentigo (08/08/2015), and Weight loss (10/15/2016).   reports that he quit smoking about 12 years ago. His smoking use included cigarettes. He has a 13.30 pack-year smoking history. He quit smokeless tobacco use about 25 years ago.  His smokeless tobacco use included snuff.  Past Surgical History:  Procedure Laterality Date  . CHEST TUBE INSERTION Right 09/29/2018   Procedure: CHEST TUBE REPLACEMENT;  Surgeon: Melrose Nakayama, MD;  Location: [facility];  Service: Thoracic;  Laterality: Right;  . INSERTION OF IBV VALVE N/A 09/19/2018   Procedure: INSERTION OF INTERBRONCHIAL VALVE (IBV);  Surgeon: Melrose Nakayama, MD;  Location: [facility] OR;  Service: Thoracic;  Laterality: N/A;  . INSERTION OF IBV VALVE N/A 12/01/2018   Procedure: REMOVAL OF INTERBRONCHIAL VALVE (IBV);  Surgeon: Melrose Nakayama, MD;  Location: [facility] OR;  Service: Thoracic;  Laterality: N/A;  . left VATS  2012   thoracotomy and LUL apical posterior segmentectomy  . STAPLING OF BLEBS Right 09/12/2018   Procedure: STAPLING OF BLEBS, right lower and middle lung lobes;  Surgeon: Melrose Nakayama, MD;  Location: [place];  Service: Thoracic;  Laterality: Right;  Marland Kitchen VIDEO ASSISTED THORACOSCOPY Right 09/12/2018   Procedure: VIDEO ASSISTED THORACOSCOPY, right lung;  Surgeon: Melrose Nakayama, MD;  Location: [place];  Service: Thoracic;  Laterality: Right;  Marland Kitchen VIDEO BRONCHOSCOPY Bilateral 10/20/2015   Procedure: VIDEO BRONCHOSCOPY WITHOUT FLUORO;  Surgeon: Marshell Garfinkel, MD;  Location: [place];  Service: Cardiopulmonary;  Laterality: Bilateral;  . VIDEO BRONCHOSCOPY N/A 09/19/2018   Procedure: VIDEO BRONCHOSCOPY;  Surgeon: Melrose Nakayama, MD;  Location: [facility] OR;  Service: Thoracic;  Laterality: N/A;  . VIDEO BRONCHOSCOPY N/A 12/01/2018   Procedure: VIDEO BRONCHOSCOPY;  Surgeon: Melrose Nakayama, MD;  Location: [place];   Service: Thoracic;  Laterality: N/A;  . VIDEO BRONCHOSCOPY WITH INSERTION OF INTERBRONCHIAL VALVE (IBV) N/A 09/29/2018   Procedure: VIDEO BRONCHOSCOPY WITH INSERTION OF INTERBRONCHIAL VALVE  (IBV);  Surgeon: Melrose Nakayama, MD;  Location: [facility] OR;  Service: Thoracic;  Laterality: N/A;    Allergies  Allergen Reactions  . Clarithromycin Other (See Comments)    Adverse reaction w/ voriconazole UNSPECIFIED REACTION   . Rifaximin Other (See Comments)    Adverse reaction w/ voriconazole UNSPECIFIED REACTION     Immunization History  Administered Date(s) Administered  . DTaP 11/19/2010  . Influenza Split 10/29/2011, 08/14/2012  . Influenza Whole 09/19/2010  . Influenza,inj,Quad PF,6+ Mos 11/23/2013, 09/28/2014, 09/06/2015, 08/27/2016, 08/10/2017, 09/09/2018, 08/06/2019  . Pneumococcal Conjugate-13 04/29/2017, 04/29/2017  . Pneumococcal Polysaccharide-23 12/17/2010    Family History  Adopted: Yes     Current Outpatient Medications:  .  albuterol (VENTOLIN HFA) 108 (90 Base) MCG/ACT inhaler, Inhale 2 puffs into the lungs every 6 (six) hours as needed. (Patient taking differently: Inhale 2 puffs into the lungs every 6 (six) hours as needed for wheezing or shortness of breath. ), Disp: 18 g, Rfl: 2 .  amoxicillin-clavulanate (AUGMENTIN) 875-125 MG tablet, Take 1 tablet by mouth every 12 (twelve) hours for 5 days., Disp: 10 tablet, Rfl: 0 .  azithromycin (ZITHROMAX) 500 MG tablet, Take 1 tablet (500 mg total) by mouth daily., Disp: 30 tablet, Rfl: 11 .  Calcium Carbonate-Vitamin D 600-400 MG-UNIT tablet, Take 1 tablet by mouth daily. , Disp: , Rfl:  .  chlorpheniramine-HYDROcodone (TUSSIONEX) 10-8 MG/5ML SUER, Take 5 mLs by mouth every 12 (twelve) hours as needed for cough., Disp: 70 mL, Rfl: 0 .  CRESEMBA 186 MG CAPS, TAKE 2 CAPSULES BY MOUTH DAILY (Patient taking differently: Take 372 mg by mouth daily. ), Disp: 56 capsule, Rfl: 5 .  EPINEPHrine 0.3 mg/0.3 mL IJ SOAJ injection, Inject  0.3 mLs (0.3 mg total) into the muscle once for 1 dose., Disp: 1 Device, Rfl: 11 .  ethambutol (MYAMBUTOL) 400 MG tablet, TAKE 2& 1/2 TABLETS BY MOUTH ONCE DAILY (Patient taking differently: Take 1,000 mg by mouth daily. ), Disp: 598 tablet, Rfl: 5 .  feeding supplement, ENSURE ENLIVE, (ENSURE ENLIVE) LIQD, Take 237 mLs by mouth 3 (three) times daily between meals. (Patient taking differently: Take 237 mLs by mouth 2 (two) times daily between meals. ), Disp: 237 mL, Rfl: 12 .  fluticasone furoate-vilanterol (BREO ELLIPTA) 100-25 MCG/INH AEPB, Inhale 1 puff into the lungs daily., Disp: 30 each, Rfl: 5 .  guaiFENesin (MUCINEX) 600 MG 12 hr tablet, Take 1 tablet (600 mg total) by mouth 2 (two) times daily., Disp: 30 tablet, Rfl: 0 .  levothyroxine (SYNTHROID) 88 MCG tablet, Take 88 mcg by mouth daily., Disp: , Rfl:  .  predniSONE (DELTASONE) 5 MG tablet, Take 1 tablet (5 mg  total) by mouth daily with breakfast., Disp: 90 tablet, Rfl: 0 .  SPIRIVA RESPIMAT 2.5 MCG/ACT AERS, INHALE 2 SPRAY(S) BY MOUTH ONCE DAILY (Patient taking differently: Inhale 2 sprays into the lungs daily. ), Disp: 4 g, Rfl: 5 .  Budeson-Glycopyrrol-Formoterol (BREZTRI AEROSPHERE) 160-9-4.8 MCG/ACT AERO, Inhale 2 puffs into the lungs 2 (two) times daily., Disp: 5.9 g, Rfl: 0      Objective:   Vitals:   06/08/20 1147  BP: 114/70  Pulse: 75  Temp: 98.5 F (36.9 C)  TempSrc: Oral  SpO2: 96%  Weight: 126 lb 9.6 oz (57.4 kg)  Height: _0  (1.727 m)    Estimated body mass index is 19.25 kg/m as calculated from the following:   Height as of this encounter: _1  (1.727 m).   Weight as of this encounter: 126 lb 9.6 oz (57.4 kg).  _2 @  [employer]   06/08/20 1147  Weight: 126 lb 9.6 oz (57.4 kg)     Physical Exam Thin male no overt distress but looking fatigued.  No oral thrush.  Slight barrel chest with prolonged expiration.  Mild retraction of accessory muscles that is baseline but seems worse slightly  than baseline from few years ago.  Abdomen soft alert and oriented x3.        Assessment:       ICD-10-CM   1. Severe persistent asthma without complication  W10.93 ECHOCARDIOGRAM COMPLETE  2. ABPA (allergic bronchopulmonary aspergillosis) ([place])  B44.81 ECHOCARDIOGRAM COMPLETE  3. Elevated IgE level  R76.8 ECHOCARDIOGRAM COMPLETE  4. 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  We will change his inhalers around to see if that could help his symptom burden.    Plan:     Patient Instructions     ICD-10-CM   1. Severe persistent asthma without complication  A35.57   2. 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 Elevated IgE level  R76.8   4. 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 Brand Males, M.D., F.C.C.P,  Pulmonary and Critical Care Medicine Staff Physician, [place] Director - Interstitial Lung Disease  Program  Pulmonary [place] at [place], [place], [address]  Pager: [phone], If no answer or between  15:00h - 7:00h: call [phone] Telephone: [phone]  11:12 AM 06/09/2020

## 2020-06-09 ENCOUNTER — Telehealth: Payer: Self-pay | Admitting: Internal Medicine

## 2020-06-09 LAB — CULTURE, BLOOD (ROUTINE X 2)
Culture: NO GROWTH
Culture: NO GROWTH
Special Requests: ADEQUATE

## 2020-06-09 NOTE — Telephone Encounter (Signed)
Patient called, questions regarding Breztri causing pneumonia reviewed. Patient is ready to start the medication, dosing instructions reviewed.

## 2020-06-13 DIAGNOSIS — M818 Other osteoporosis without current pathological fracture: Secondary | ICD-10-CM | POA: Diagnosis not present

## 2020-06-13 DIAGNOSIS — N6320 Unspecified lump in the left breast, unspecified quadrant: Secondary | ICD-10-CM | POA: Diagnosis not present

## 2020-06-13 DIAGNOSIS — Z7952 Long term (current) use of systemic steroids: Secondary | ICD-10-CM | POA: Diagnosis not present

## 2020-06-13 DIAGNOSIS — R634 Abnormal weight loss: Secondary | ICD-10-CM | POA: Diagnosis not present

## 2020-06-13 DIAGNOSIS — E033 Postinfectious hypothyroidism: Secondary | ICD-10-CM | POA: Diagnosis not present

## 2020-06-13 DIAGNOSIS — J189 Pneumonia, unspecified organism: Secondary | ICD-10-CM | POA: Diagnosis not present

## 2020-06-23 ENCOUNTER — Ambulatory Visit (HOSPITAL_COMMUNITY): Payer: BC Managed Care – PPO | Attending: Internal Medicine

## 2020-06-23 ENCOUNTER — Other Ambulatory Visit: Payer: Self-pay

## 2020-06-23 DIAGNOSIS — A310 Pulmonary mycobacterial infection: Secondary | ICD-10-CM | POA: Insufficient documentation

## 2020-06-23 DIAGNOSIS — J4550 Severe persistent asthma, uncomplicated: Secondary | ICD-10-CM | POA: Insufficient documentation

## 2020-06-23 DIAGNOSIS — B4481 Allergic bronchopulmonary aspergillosis: Secondary | ICD-10-CM | POA: Insufficient documentation

## 2020-06-23 DIAGNOSIS — B449 Aspergillosis, unspecified: Secondary | ICD-10-CM | POA: Diagnosis not present

## 2020-06-23 DIAGNOSIS — R768 Other specified abnormal immunological findings in serum: Secondary | ICD-10-CM | POA: Insufficient documentation

## 2020-06-23 LAB — ECHOCARDIOGRAM COMPLETE
Area-P 1/2: 2.64 cm2
S' Lateral: 2.9 cm

## 2020-06-29 ENCOUNTER — Other Ambulatory Visit: Payer: Self-pay

## 2020-06-29 ENCOUNTER — Ambulatory Visit (INDEPENDENT_AMBULATORY_CARE_PROVIDER_SITE_OTHER): Payer: BC Managed Care – PPO | Admitting: Adult Health

## 2020-06-29 ENCOUNTER — Encounter: Payer: Self-pay | Admitting: Adult Health

## 2020-06-29 DIAGNOSIS — A310 Pulmonary mycobacterial infection: Secondary | ICD-10-CM

## 2020-06-29 DIAGNOSIS — B4481 Allergic bronchopulmonary aspergillosis: Secondary | ICD-10-CM

## 2020-06-29 DIAGNOSIS — J4550 Severe persistent asthma, uncomplicated: Secondary | ICD-10-CM

## 2020-06-29 DIAGNOSIS — B449 Aspergillosis, unspecified: Secondary | ICD-10-CM

## 2020-06-29 NOTE — Progress Notes (Signed)
_0  ID: [name], male    DOB: 06/25/1976, 44 y.o.   MRN: [id number]  No chief complaint on file.   Referring provider: Rip Harbour  HPI: 44 year old male never smoker followed for severe persistent asthma complicated by ABPA, MAC and aspergilloma Previous episodes of hemoptysis, severe episode in 2016 requiring interventional guided embolization Patient has a history of aspergilloma status post VATS in 2012.  He is on lifelong antifungal.  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  Followed in [place] for MAC on ethambutol and azithromycin.  TEST/EVENTS :  1.7/13 Ig E 800s( 4000 2012 ) 12/10/11 Sputum AFB Smear and culture negative (prelim) 12/10/11 Fungal sputum culture - negative (final) 01/30/2012 spirometry - fev1 1.7L/41%, ratip 52 - BEST EVER 01/30/2012 walking desaturation test: 185 feet x 3 laps: did not deaturate 05/2014 >Spiro fev1 1.7L/40%, ratio 55 Spirometry 06/25/2017 showed FEV1 at 28%, ratio 46, FVC 50%.  Events  2010 Dx with MAI >Azithro/Ethambutol 2012 Dx with Aspergilloma >VATS >started on Voriconazaol  2012 Dx w/ ABPA w/ High IgE >4000>started on prednisone  2013 tried off azithr/Etham but developed fever, Cx neg but restarted on rx.  2016 Hospitalzed with hemoptysis >IR guided embolization . FOB/BAL + Saccharomyces on fungal fx . Serum Galactomannan was neg.  [name] evaluation 04/2017 for Transplant -rejected. Felt to early for transplant- recommended pre-transplant weight goal 128 lbs for a BMI > 19  Virtual Visit via Telephone Note  I connected with [name] on 06/29/20 at 12:00 PM EDT by telephone and verified that I am speaking with the correct person using two identifiers.  Location: Patient: Home  Provider: office    I discussed the limitations, risks, security and privacy concerns of performing an evaluation and management service by telephone and the availability of in person appointments. I also discussed with the  patient that there may be a patient responsible charge related to this service. The patient expressed understanding and agreed to proceed.   History of Present Illness:     I discussed the assessment and treatment plan with the patient. The patient was provided an opportunity to ask questions and all were answered. The patient agreed with the plan and demonstrated an understanding of the instructions.   The patient was advised to call back or seek an in-person evaluation if the symptoms worsen or if the condition fails to improve as anticipated.    06/29/2020 Follow up : Bronchiectasis ,Asthma, ABPA , Aspergilloma , MAC  Patient returns for a 1 month follow-up.  Was admitted earlier this summer for hemoptysis felt to have underlying associated pneumonia.  Patient says since last visit he is feeling some better feels like he is more like his baseline.  He has had no further hemoptysis episodes.  He is back on his complex medical regimen. He is followed by infectious disease for MAC and is on ethambutol and azithromycin. He has ABPA and severe persistent asthma.  He is on [place].  Has recently been changed to [facility] which he says is helping.  Would like a refill sent to the pharmacy.  He is also on chronic steroids with prednisone 5 mg daily.  Patient had a recent CT chest during his hospitalization that showed chronic narrowing and occlusion of several of the right upper lobe segmental and subsegmental pulmonary arteries additional filling defects present in the left upper lobe segmental and subsegmental branches with regions of extensive bullous disease architectural distortion and surgical changes  most suggestive of a chronic embolus and occlusion.  However with his history of recurrent hemoptysis not felt to be a candidate for chronic anticoagulation.  Was also noted to have some right atrial enlargement with reflux and right heart enlargement and chronic pulmonary artery hypertension.  Subsequent  2D echo was ordered and completed on June 23, 2020 that showed preserved EF with an EF at 65%, normal right ventricle systolic function.  Slightly enlarged right ventricle.  Mildly elevated pulmonary artery pressure with RVSP at 39.7 mmHg.  Trivial aortic valve regurgitation and borderline dilation of the aortic, grade 1 diastolic dysfunction.  Mild asymmetrical left ventricle hypertrophy.  We went over his echo results.  Patient is not on oxygen at home.  O2 saturations have been well on visits.  Patient has returned back to work full-time.  He does find it difficult to work but was denied disability.    Allergies  Allergen Reactions  . Clarithromycin Other (See Comments)    Adverse reaction w/ voriconazole UNSPECIFIED REACTION   . Rifaximin Other (See Comments)    Adverse reaction w/ voriconazole UNSPECIFIED REACTION     Immunization History  Administered Date(s) Administered  . DTaP 11/19/2010  . Influenza Split 10/29/2011, 08/14/2012  . Influenza Whole 09/19/2010  . Influenza,inj,Quad PF,6+ Mos 11/23/2013, 09/28/2014, 09/06/2015, 08/27/2016, 08/10/2017, 09/09/2018, 08/06/2019  . Pneumococcal Conjugate-13 04/29/2017, 04/29/2017  . Pneumococcal Polysaccharide-23 12/17/2010    Past Medical History:  Diagnosis Date  . Anemia   . 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[place])   . Drug rash 12/16/2017  . Dyspnea   . Esophageal reflux    very rare  . 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 Lung disease, bullous ([place])   . MAI (mycobacterium avium-intracellulare) ([place])   . Mold exposure 12/05/2015  . 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 Solar lentigo 08/08/2015  . Weight loss 10/15/2016    Tobacco History: Social History   Tobacco Use  Smoking Status Former Smoker  . Packs/day: 0.70  . Years: 19.00  . Pack years: 13.30  . Types: Cigarettes  . Quit date: 11/20/2007  . Years since quitting: 12.6  Smokeless Tobacco Former Systems developer  . Types: Snuff  . Quit date: 11/19/1994    Counseling given: Not Answered   Outpatient Medications Prior to Visit  Medication Sig Dispense Refill  . albuterol (VENTOLIN HFA) 108 (90 Base) MCG/ACT inhaler Inhale 2 puffs into the lungs every 6 (six) hours as needed. (Patient taking differently: Inhale 2 puffs into the lungs every 6 (six) hours as needed for wheezing or shortness of breath. ) 18 g 2  . azithromycin (ZITHROMAX) 500 MG tablet Take 1 tablet (500 mg total) by mouth daily. 30 tablet 11  . Budeson-Glycopyrrol-Formoterol (BREZTRI AEROSPHERE) 160-9-4.8 MCG/ACT AERO Inhale 2 puffs into the lungs 2 (two) times daily. 5.9 g 0  . Calcium Carbonate-Vitamin D 600-400 MG-UNIT tablet Take 1 tablet by mouth daily.     . chlorpheniramine-HYDROcodone (TUSSIONEX) 10-8 MG/5ML SUER Take 5 mLs by mouth every 12 (twelve) hours as needed for cough. 70 mL 0  . CRESEMBA 186 MG CAPS TAKE 2 CAPSULES BY MOUTH DAILY (Patient taking differently: Take 372 mg by mouth daily. ) 56 capsule 5  . EPINEPHrine 0.3 mg/0.3 mL IJ SOAJ injection Inject 0.3 mLs (0.3 mg total) into the muscle once for 1 dose. 1 Device 11  . ethambutol (MYAMBUTOL) 400 MG tablet TAKE 2& 1/2 TABLETS BY MOUTH ONCE DAILY (Patient taking differently: Take 1,000 mg by mouth daily. )  598 tablet 5  . feeding supplement, ENSURE ENLIVE, (ENSURE ENLIVE) LIQD Take 237 mLs by mouth 3 (three) times daily between meals. (Patient taking differently: Take 237 mLs by mouth 2 (two) times daily between meals. ) 237 mL 12  . guaiFENesin (MUCINEX) 600 MG 12 hr tablet Take 1 tablet (600 mg total) by mouth 2 (two) times daily. 30 tablet 0  . levothyroxine (SYNTHROID) 88 MCG tablet Take 88 mcg by mouth daily.    . predniSONE (DELTASONE) 5 MG tablet Take 1 tablet (5 mg total) by mouth daily with breakfast. 90 tablet 0  . fluticasone furoate-vilanterol (BREO ELLIPTA) 100-25 MCG/INH AEPB Inhale 1 puff into the lungs daily. 30 each 5  . SPIRIVA RESPIMAT 2.5 MCG/ACT AERS INHALE 2 SPRAY(S) BY MOUTH ONCE DAILY  (Patient taking differently: Inhale 2 sprays into the lungs daily. ) 4 g 5   No facility-administered medications prior to visit.      BNP No results found for: BNP  ProBNP    Component Value Date/Time   PROBNP 97.0 03/07/2011 0918    Imaging: DG Chest 2 View  Result Date: 06/04/2020 CLINICAL DATA:  Hemoptysis for 2 weeks. Decreased oxygen saturation. EXAM: CHEST - 2 VIEW COMPARISON:  PA and lateral chest 12/16/2018. FINDINGS: Severe bullous emphysema is again seen. There is a new focus of airspace disease in the lingula. Trace right pleural effusion is decreased compared to the prior exam. No pneumothorax. Heart size is normal. No acute bony abnormality. Remote midthoracic compression fracture noted. IMPRESSION: New focus of patchy airspace disease in the lingula worrisome for pneumonia superimposed on severe bullous emphysema. Electronically Signed   By: Inge Rise M.D.   On: 06/04/2020 17:46   CT Angio Chest PE W and/or Wo Contrast  Result Date: 06/04/2020 CLINICAL DATA:  Shortness of breath, 2 weeks of hemoptysis EXAM: CT ANGIOGRAPHY CHEST WITH CONTRAST TECHNIQUE: Multidetector CT imaging of the chest was performed using the standard protocol during bolus administration of intravenous contrast. Multiplanar CT image reconstructions and MIPs were obtained to evaluate the vascular anatomy. CONTRAST:  158m OMNIPAQUE IOHEXOL 350 MG/ML SOLN COMPARISON:  CT 09/04/2018 FINDINGS: Cardiovascular: Satisfactory opacification the pulmonary arteries to the segmental level. Chronic narrowing and occlusion several right upper lobe segmental and subsegmental pulmonary arteries (coronal MIP 10/76-78). Additional filling defects present in the left upper lobe segmental and subsegmental branches as well. These are both within regions of extensive bullous disease, architectural distortion and surgical change and the peripherally marginated appearance is most suggestive of chronic embolus/occlusion. No  other pulmonary artery emboli are identified. While the RV/LV ratio is increased (1.15) the intraventricular septum remains appropriately convexed and there is associated right atrial enlargement with reflux of contrast which could suggest more longstanding right heart enlargement and chronic pulmonary artery hypertension. Suboptimal opacification of the thoracic aorta. No gross luminal abnormality. Atherosclerotic plaque within the normal caliber aorta. Normal 3 vessel branching of the aortic arch. Proximal great vessels are unremarkable. No major venous abnormalities. Mediastinum/Nodes: No mediastinal fluid or gas. Normal thyroid gland and thoracic inlet. No acute abnormality of the trachea or esophagus. No worrisome mediastinal, hilar or axillary adenopathy. Lungs/Pleura: Extensive chronic bullous paraseptal predominant emphysematous changes. Scattered areas of thick walled cystic bulla towards the apices with some peripheral nodularity, including a recess of cystic change demonstrating layering air-fluid level. There are mixed areas of ground-glass and consolidative opacity in the left lower and right lower lobe periphery. Some associated airways thickening and scattered secretions are noted. Numerous calcifications  may reflect sequela of prior granulomatous disease. Upper Abdomen: Calcified gallstone within the otherwise normal gallbladder. Fluid attenuation cyst seen in the upper pole left kidney. No acute abnormalities present in the visualized portions of the upper abdomen. Musculoskeletal: Multilevel degenerative changes are present in the imaged portions of the spine. Exaggerated thoracic kyphosis is similar to prior. No acute chest wall abnormality. Cachectic appearance with paucity of subcutaneous fat. Moderate bilateral gynecomastia. Review of the MIP images confirms the above findings. IMPRESSION: 1. 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  Additional filling defects present in the left upper lobe segmental and subsegmental branches as well. These are both within regions of extensive bullous disease, architectural distortion and surgical change and the peripherally marginated appearance is most suggestive of chronic embolus/occlusion. 2. While the RV/LV ratio is increased (1.15) the intraventricular septum remains appropriately convex and there is associated right atrial enlargement with reflux of contrast which could suggest more longstanding right heart enlargement and chronic pulmonary artery hypertension rather than acute right heart strain though if there is clinical concern, echocardiography could be performed for more definitive assessment. 3. Extensive chronic bullous paraseptal predominant emphysematous changes. Scattered areas of thick walled cystic bulla towards the apices with some peripheral nodularity and layering air-fluid level within the left apical bulla which could reflect a persisting fungal or atypical superinfection. Certainly could present with hemoptysis. 4. Further mixed areas of ground-glass and consolidative opacity in the left lower and right lower lobe periphery, also likely infectious or inflammatory with the differential including potential atypical viral infection such as COVID-19. 5. Cholelithiasis. 6. Moderate bilateral gynecomastia. 7. Paucity of subcutaneous fat.  Correlate with nutritional status. 8. Aortic Atherosclerosis (ICD10-I70.0). 9. Emphysema (ICD10-J43.9). These results were called by telephone at the time of interpretation on 06/04/2020 at 10:50 pm to provider DAVID YAO , who verbally acknowledged these results. Electronically Signed   By: Lovena Le M.D.   On: 06/04/2020 22:50   ECHOCARDIOGRAM COMPLETE  Result Date: 06/23/2020    ECHOCARDIOGRAM REPORT   Patient Name:   [name]   Date of Exam: 06/23/2020 Medical Rec #:  [id number]     Height:       68.0 in Accession #:    [id number]    Weight:        126.6 lb Date of Birth:  11-09-1976     BSA:          1.683 m Patient Age:    78 years      BP:           102/80 mmHg Patient Gender: M             HR:           73 bpm. Exam Location:  [place] Procedure: 2D Echo, 3D Echo, Cardiac Doppler, Color Doppler and Strain Analysis Indications:    R06.00 Dyspnea  History:        Patient has no prior history of Echocardiogram examinations.                 Signs/Symptoms:Dyspnea; Risk Factors:Former Smoker. Asthma,                 Mycobacterium Avium Infection, History of Aspergilloma status                 post Resection with CT Surgery. History of Pneumothorax.  Sonographer:    Deliah Boston RDCS Referring Phys: [place]  1. Left ventricular ejection fraction by 3D volume is 65 %. The left ventricle has normal function. The left ventricle has no regional wall motion abnormalities. There is mild asymmetric left ventricular hypertrophy of the inferolateral segment. Left ventricular diastolic parameters are consistent with Grade I diastolic dysfunction (impaired relaxation).  2. Right ventricular systolic function is normal. The right ventricular size is mildly enlarged. There is mildly elevated pulmonary artery systolic pressure. The estimated right ventricular systolic pressure is 80.0 mmHg.  3. The mitral valve is normal in structure. Trivial mitral valve regurgitation. No evidence of mitral stenosis.  4. The aortic valve is tricuspid. Aortic valve regurgitation is trivial. No aortic stenosis is present.  5. Aortic dilatation noted. There is borderline dilatation at the level of the sinuses of Valsalva measuring 38 mm.  6. The inferior vena cava is normal in size with greater than 50% respiratory variability, suggesting right atrial pressure of 3 mmHg. FINDINGS  Left Ventricle: Left ventricular ejection fraction by 3D volume is 65 %. The left ventricle has normal function. The left ventricle has no regional wall motion abnormalities. Global  longitudinal strain performed but not reported based on interpreter judgement due to suboptimal tracking. The left ventricular internal cavity size was normal in size. There is mild asymmetric left ventricular hypertrophy of the inferolateral segment. Left ventricular diastolic parameters are consistent with Grade I diastolic dysfunction (impaired relaxation). Right Ventricle: The right ventricular size is mildly enlarged. No increase in right ventricular wall thickness. Right ventricular systolic function is normal. There is mildly elevated pulmonary artery systolic pressure. The tricuspid regurgitant velocity is 3.03 m/s, and with an assumed right atrial pressure of 3 mmHg, the estimated right ventricular systolic pressure is 34.9 mmHg. Left Atrium: Left atrial size was normal in size. Right Atrium: Right atrial size was normal in size. Pericardium: There is no evidence of pericardial effusion. Mitral Valve: The mitral valve is normal in structure. Normal mobility of the mitral valve leaflets. Trivial mitral valve regurgitation. No evidence of mitral valve stenosis. Tricuspid Valve: The tricuspid valve is normal in structure. Tricuspid valve regurgitation is mild . No evidence of tricuspid stenosis. Aortic Valve: The aortic valve is tricuspid. Aortic valve regurgitation is trivial. No aortic stenosis is present. Pulmonic Valve: The pulmonic valve was normal in structure. Pulmonic valve regurgitation is trivial. No evidence of pulmonic stenosis. Aorta: Aortic dilatation noted. There is borderline dilatation at the level of the sinuses of Valsalva measuring 38 mm. Venous: The inferior vena cava is normal in size with greater than 50% respiratory variability, suggesting right atrial pressure of 3 mmHg. IAS/Shunts: No atrial level shunt detected by color flow Doppler.  LEFT VENTRICLE PLAX 2D LVIDd:         3.90 cm         Diastology LVIDs:         2.90 cm         LV e' lateral:   11.60 cm/s LV PW:         1.20 cm          LV E/e' lateral: 5.2 LV IVS:        0.90 cm         LV e' medial:    8.38 cm/s LVOT diam:     2.50 cm         LV E/e' medial:  7.2 LV SV:         73 LV SV Index:   43  2D LVOT Area:     4.91 cm        Longitudinal                                Strain                                2D Strain GLS  17.3 %                                (A2C):                                2D Strain GLS  18.1 %                                (A3C):                                2D Strain GLS  16.2 %                                (A4C):                                2D Strain GLS  17.2 %                                Avg:                                 3D Volume EF                                LV 3D EF:    Left                                             ventricular                                             ejection                                             fraction by                                             3D volume  is 65 %.                                 3D Volume EF:                                3D EF:        65 %                                LV EDV:       156 ml                                LV ESV:       55 ml                                LV SV:        102 ml RIGHT VENTRICLE RV S prime:     14.80 cm/s TAPSE (M-mode): 2.1 cm LEFT ATRIUM             Index       RIGHT ATRIUM          Index LA diam:        3.30 cm 1.96 cm/m  RA Area:     9.39 cm LA Vol (A2C):   50.5 ml 30.01 ml/m RA Volume:   19.80 ml 11.77 ml/m LA Vol (A4C):   47.9 ml 28.47 ml/m LA Biplane Vol: 49.7 ml 29.53 ml/m  AORTIC VALVE LVOT Vmax:   76.10 cm/s LVOT Vmean:  44.650 cm/s LVOT VTI:    0.148 m  AORTA Ao Root diam: 3.80 cm Ao Asc diam:  3.70 cm MITRAL VALVE               TRICUSPID VALVE MV Area (PHT): cm         TR Peak grad:   36.7 mmHg MV Decel Time: 287 msec    TR Vmax:        303.00 cm/s MV E velocity: 60.40 cm/s MV A velocity: 90.00 cm/s  SHUNTS MV E/A ratio:  0.67         Systemic VTI:  0.15 m                            Systemic Diam: 2.50 cm Cherlynn Kaiser MD Electronically signed by Cherlynn Kaiser MD Signature Date/Time: 06/23/2020/8:10:17 PM    Final     Benralizumab SOSY 30 mg    Date Action Dose Route User   06/08/2020 1403 Given  Subcutaneous (Left Arm) Elton Sin, LPN      PFT Results Latest Ref Rng & Units 06/25/2017 08/06/2016  FVC-Pre L 2.52 2.84  FVC-Predicted Pre % 50 61  FVC-Post L - 2.93  FVC-Predicted Post % - 63  Pre FEV1/FVC % % 46 53  Post FEV1/FCV % % - 56  FEV1-Pre L 1.16 1.50  FEV1-Predicted Pre % 28 39  FEV1-Post L - 1.63  DLCO uncorrected ml/min/mmHg - 18.56  DLCO UNC% % - 59  DLCO corrected ml/min/mmHg - 18.35  DLCO COR %Predicted % - 59  DLVA Predicted % -  108  TLC L - 5.14  TLC % Predicted % - 76  RV % Predicted % - 129    No results found for: NITRICOXIDE      Assessment & Plan:   No problem-specific Assessment & Plan notes found for this encounter.  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 Patient Instructions  Continue on BREZTRI 2 puffs Twice daily  , rinse after use. Continue on Prednisone 6m daily. Please eat often add High protein food and drinks.  Continue on current regimen.  Continue on Fasenra.  Follow up with Dr. RChase Calleror Zakhari Fogel NP in 3 months and As needed   Please contact office for sooner follow up if symptoms do not improve or worsen or seek emergency care        I provided 45  minutes of non-face-to-face time during this encounter.   TRexene Edison NP    TRexene Edison NP 06/29/2020

## 2020-06-29 NOTE — Patient Instructions (Addendum)
Continue on BREZTRI 2 puffs Twice daily  , rinse after use. Continue on Prednisone 55m daily. Please eat often add High protein food and drinks.  Continue on current regimen.  Continue on Fasenra.  Follow up with Dr. RChase Calleror Ankit Degregorio NP in 3 months and As needed   Please contact office for sooner follow up if symptoms do not improve or worsen or seek emergency care

## 2020-06-30 MED ORDER — PREDNISONE 5 MG PO TABS: 5.0000 mg | ORAL_TABLET | Freq: Every day | ORAL | 1 refills | Status: DC

## 2020-06-30 MED ORDER — BREZTRI AEROSPHERE 160-9-4.8 MCG/ACT IN AERO: 2.0000 | INHALATION_SPRAY | Freq: Two times a day (BID) | RESPIRATORY_TRACT | 5 refills | Status: DC

## 2020-06-30 NOTE — Addendum Note (Signed)
Addended by: Desmond Dike C on: 06/30/2020 09:41 AM   Modules accepted: Orders

## 2020-07-03 NOTE — Progress Notes (Signed)
Overall heart function is normal but for mild stiffness on left side and stress on the right side on account of his lung disease. Can be contributing a bit to his dyspnea but do not see a needs to refer to cardiology at this point. Recommend serial monitoring

## 2020-07-11 MED FILL — CRESEMBA 186 MG CAPSULE: 186 | 28 days supply | Qty: 56 | Fill #2

## 2020-07-17 ENCOUNTER — Other Ambulatory Visit: Payer: Self-pay | Admitting: Infectious Disease

## 2020-07-17 LAB — PULMONARY FUNCTION TEST
FEV1-Pre: 1.16 L
FEV6-%Pred-Pre: 48 %
FEV6-Pre: 2.39 L
FVC-%Pred-Pre: 50 %
Pre FEV6/FVC Ratio: 95 %

## 2020-07-20 DIAGNOSIS — E033 Postinfectious hypothyroidism: Secondary | ICD-10-CM | POA: Diagnosis not present

## 2020-07-21 ENCOUNTER — Other Ambulatory Visit: Payer: Self-pay | Admitting: Internal Medicine

## 2020-07-21 ENCOUNTER — Other Ambulatory Visit: Payer: Self-pay | Admitting: Infectious Disease

## 2020-07-22 ENCOUNTER — Other Ambulatory Visit: Payer: Self-pay | Admitting: Infectious Disease

## 2020-07-22 ENCOUNTER — Other Ambulatory Visit: Payer: Self-pay

## 2020-07-26 ENCOUNTER — Telehealth: Payer: Self-pay | Admitting: Internal Medicine

## 2020-07-26 ENCOUNTER — Telehealth: Payer: Self-pay

## 2020-07-26 NOTE — Telephone Encounter (Signed)
[name] Order: 33m #1 prefilled syringe Ordered date: 07/26/20 Expected date of arrival: 9/8-07/28/20 Ordered by: LHartleton [name]

## 2020-07-26 NOTE — Telephone Encounter (Signed)
Prior authorization for Azithromycin was approved. Pharmacy notified.   Maysie Parkhill Lorita Officer, RN

## 2020-07-27 NOTE — Telephone Encounter (Signed)
[name] Shipment Received:  38m #1 prefilled syringe Medication arrival date: 07/27/20 Lot #: [id number] date: 10/18/2021 Received by: LElliot Dally

## 2020-08-03 ENCOUNTER — Other Ambulatory Visit: Payer: Self-pay

## 2020-08-03 ENCOUNTER — Ambulatory Visit (INDEPENDENT_AMBULATORY_CARE_PROVIDER_SITE_OTHER): Payer: BC Managed Care – PPO

## 2020-08-03 DIAGNOSIS — J4550 Severe persistent asthma, uncomplicated: Secondary | ICD-10-CM | POA: Diagnosis not present

## 2020-08-03 MED ORDER — BENRALIZUMAB 30 MG/ML ~~LOC~~ SOSY
30.0000 mg | PREFILLED_SYRINGE | Freq: Once | SUBCUTANEOUS | Status: AC
  Administered 2020-08-03: 30 mg via SUBCUTANEOUS

## 2020-08-03 NOTE — Progress Notes (Signed)
Have you been hospitalized within the last 10 days?  No Do you have a fever?  No Do you have a cough?  No Do you have a headache or sore throat? No Do you have your Epi Pen visible and is it within date?  Yes 

## 2020-08-09 ENCOUNTER — Telehealth: Payer: Self-pay

## 2020-08-09 NOTE — Telephone Encounter (Signed)
COVID-19 Pre-Screening Questions:08/09/20  Do you currently have a fever (>100 F), chills or unexplained body aches?NO  Are you currently experiencing new cough, shortness of breath, sore throat, runny nose? NO   Have you been in contact with someone that is currently pending confirmation of Covid19 testing or has been confirmed to have the [place] virus? NO   **If the patient answers NO to ALL questions -  advise the patient to please call the clinic before coming to the office should any symptoms develop.

## 2020-08-10 ENCOUNTER — Ambulatory Visit
Admission: RE | Admit: 2020-08-10 | Discharge: 2020-08-10 | Disposition: A | Discharge status: Home / Self Care | Payer: BC Managed Care – PPO | Source: Ambulatory Visit | Attending: Infectious Disease | Admitting: Infectious Disease

## 2020-08-10 ENCOUNTER — Encounter: Payer: Self-pay | Admitting: Infectious Disease

## 2020-08-10 ENCOUNTER — Ambulatory Visit (INDEPENDENT_AMBULATORY_CARE_PROVIDER_SITE_OTHER): Payer: BC Managed Care – PPO | Admitting: Infectious Disease

## 2020-08-10 ENCOUNTER — Other Ambulatory Visit: Payer: Self-pay

## 2020-08-10 ENCOUNTER — Ambulatory Visit (INDEPENDENT_AMBULATORY_CARE_PROVIDER_SITE_OTHER): Payer: BC Managed Care – PPO

## 2020-08-10 VITALS — BP 116/76 | HR 76 | Temp 98.3°F | Ht 68.0 in | Wt 127.0 lb

## 2020-08-10 DIAGNOSIS — B449 Aspergillosis, unspecified: Secondary | ICD-10-CM

## 2020-08-10 DIAGNOSIS — A310 Pulmonary mycobacterial infection: Secondary | ICD-10-CM | POA: Diagnosis not present

## 2020-08-10 DIAGNOSIS — R042 Hemoptysis: Secondary | ICD-10-CM

## 2020-08-10 DIAGNOSIS — R0600 Dyspnea, unspecified: Secondary | ICD-10-CM | POA: Diagnosis not present

## 2020-08-10 DIAGNOSIS — Z23 Encounter for immunization: Secondary | ICD-10-CM

## 2020-08-10 DIAGNOSIS — L568 Other specified acute skin changes due to ultraviolet radiation: Secondary | ICD-10-CM

## 2020-08-10 DIAGNOSIS — B4481 Allergic bronchopulmonary aspergillosis: Secondary | ICD-10-CM | POA: Diagnosis not present

## 2020-08-10 DIAGNOSIS — J930 Spontaneous tension pneumothorax: Secondary | ICD-10-CM

## 2020-08-10 NOTE — Progress Notes (Signed)
[employer] Booster administered during office visit today with Dr. Tommy Medal. Patient consent obtained. Patient monitored and tolerated infection well. [name]

## 2020-08-10 NOTE — Progress Notes (Signed)
Chief complaint: Worsening feet fatigue and cough in the last 4 days  Subjective:    : Patient ID: [name], male    DOB: [birth year], 44 y.o.   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 We had taken him off his Mycobacterium avium drugs and then he developed fevers chills and malaise  Spring (2013) and was seen by my partner Dr. Megan Salon in late April  His AFB smears and cultures obtained in the clinic on that day prior to the initiation of azithromycin ethambutol ultimately failed to grow Mycobacterium avium. He had been doing well I saw him in [place] 2013 we discontinued his azithromycin and ethambutol again. Unfortunately he again shortly thereafter developed fevers cough malaise. He started back on azithromycin ethambutol. Cultures done for AFB here were again negative. The patient however felt dramatically better having been back on azithromycin and ethambutol.   I saw him in January of 2015 when he was feeling relatively well.  He did have an episode of coughing up a few flecks of blood in January 2016 which prompted appt with [facility] but by time he made appt this had long since resolved. Was only a one time episode none since then.  I saw again in March and then in September 2016. In summer he was hospitalized with hemoptysis and found to have an area in the upper lobe consistent with an aspergilloma. He underwent bronchoscopy with BAL and cultures ultimately yielded a Saccharomyces species on fungal culture. His serum galactomannan was negative.  He had been  on voriconazole and axial has had radiographic improvement and his x-ray findings. He also feelt etter symptomatically  with no more hemoptysis improvement in his cough improvement in his energy though he still feels not nearly as well as he did prior to his hospitalization. He is avoiding exercised during the winter as it often precipitates coughing spells. He was  only taking 200 mg twice a day of voriconazole rather than the 300 mg he has continued this along with his ethambutol and azithromycin. He continued to have cough more so with exercise and has not been exercising at "[employer]" due to the fact this elicits more coughing. He has not smoked since 2007. He is seeing [name] and is being considered for new drug infusion to treat his COPD.  He did have 20# weight loss that he attributed to increased exercise.  He then saw  [name] after an episode of hematemesis. 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 He said he only coughed up a small amount of blood material and did not go to the ER but was seen and followed by pulmonary. Had a chest x-ray which showed no new findings.  He was then  going to be evaluated by the transplant team at [facility].  He did complain of hyperpigmented spots on sun exposed areas on his body including arms and neck which his dermatologist have told him is due to his voriconazole use.   We considered change at that time  Since that time he has [place] been admitted with hemoptysis at [facility].   Repeat CT lungs showed on 08/09/17:  Large emphysematous bulla are noted in both upper  lobes. There is interval development of fluid density in several bulla in the left upper lobe concerning for superimposed infection, possibly fungal in origin.  Stable bilateral calcified pulmonary nodules are noted consistent with prior granulomatous disease.  Stable tree in bud appearance is noted medially in the left lung posteriorly consistent with sequela of previous atypical infection.   Due to his skin toxicity we decided to change him to  [facility] and he is on that now .  And had done well.  He had problems with bilateral pneumothoraces.  This required placement of an intrabronchial valve in November 2019.  This resolved but then he had to be taken back to the operating room in early January where the intrabronchial valves were removed.  Was seen at [facility] medical transplant team as well but felt not yet to be a transplant candidate due to the fact that his lungs were not in severe enough condition to warrant transplantation  He had applied for disability but this has not yet gone through.  Certainly he deserves to have disability and he has significant medical problems.  He did however eventually go back to work at [employer]  He is been receiving his antimycobacterial drugs and is [place].  I last saw cans he was hospitalized in July for what he says was pneumonia.  The CT scan was read as showing:  " Chronic narrowing and occlusion of several right upper lobe segmental subsegmental pulmonary arteries and filling defects in the left upper lobe segmental and subsegmental branches which were within the regions of bullous disease which are suggestive of chronic embolism/occlusion.  It showed extensive bullous and paraseptal predominant emphysematous changes with thick-walled cystic bullae toward the apices with nodularity and air layering air-fluid levels within the left apical bulla consistent with aspergilloma.  He had mixed areas of groundglass and consolidative opacity left lower lobe and right lower lobe periphery.  Nares note indicates that he was not anticoagulated due to the fact that he has recurrent hemoptysis per the patient himself was unaware that he was found to have anything suggestive clots on scan and believes he was only treated for pneumonia.  He had been feeling better but now the last 4 days of feeling more tired and been coughing a bit more.  He is fearful of having another admission to the  hospital which he says typically happens in October.  Exam he had diminished breath sounds on the right though audible there were diminished versus the left.    Past Medical History:  Diagnosis Date  . Anemia   . 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[place])   . Drug rash 12/16/2017  . Dyspnea   . Esophageal reflux    very rare  . 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 Lung disease, bullous ([place])   . MAI (mycobacterium avium-intracellulare) ([place])   . Mold exposure 12/05/2015  . 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 Solar lentigo 08/08/2015  . Weight loss 10/15/2016   Past Surgical History:  Procedure Laterality Date  . CHEST TUBE INSERTION Right 09/29/2018   Procedure: CHEST TUBE REPLACEMENT;  Surgeon: Melrose Nakayama, MD;  Location: [place];  Service: Thoracic;  Laterality: Right;  . INSERTION OF IBV VALVE N/A 09/19/2018   Procedure: INSERTION OF INTERBRONCHIAL VALVE (IBV);  Surgeon: Melrose Nakayama, MD;  Location: [facility] OR;  Service: Thoracic;  Laterality: N/A;  . INSERTION OF IBV VALVE N/A 12/01/2018   Procedure: REMOVAL OF INTERBRONCHIAL VALVE (IBV);  Surgeon: Modesto Charon  C, MD;  Location: [place] OR;  Service: Thoracic;  Laterality: N/A;  . left VATS  2012   thoracotomy and LUL apical posterior segmentectomy  . STAPLING OF BLEBS Right 09/12/2018   Procedure: STAPLING OF BLEBS, right lower and middle lung lobes;  Surgeon: Melrose Nakayama, MD;  Location: [place];  Service: Thoracic;  Laterality: Right;  Marland Kitchen VIDEO ASSISTED THORACOSCOPY Right 09/12/2018   Procedure: VIDEO ASSISTED THORACOSCOPY, right lung;  Surgeon: Melrose Nakayama, MD;  Location: [place];  Service: Thoracic;  Laterality: Right;  [place] Kitchen VIDEO BRONCHOSCOPY Bilateral 10/20/2015   Procedure: VIDEO BRONCHOSCOPY WITHOUT FLUORO;  Surgeon: Marshell Garfinkel, MD;  Location: [place];  Service: Cardiopulmonary;  Laterality: Bilateral;  . VIDEO BRONCHOSCOPY N/A 09/19/2018   Procedure: VIDEO BRONCHOSCOPY;  Surgeon:  Melrose Nakayama, MD;  Location: [facility] OR;  Service: Thoracic;  Laterality: N/A;  . VIDEO BRONCHOSCOPY N/A 12/01/2018   Procedure: VIDEO BRONCHOSCOPY;  Surgeon: Melrose Nakayama, MD;  Location: [place];  Service: Thoracic;  Laterality: N/A;  . VIDEO BRONCHOSCOPY WITH INSERTION OF INTERBRONCHIAL VALVE (IBV) N/A 09/29/2018   Procedure: VIDEO BRONCHOSCOPY WITH INSERTION OF INTERBRONCHIAL VALVE  (IBV);  Surgeon: Melrose Nakayama, MD;  Location: [facility] OR;  Service: Thoracic;  Laterality: N/A;   Family History  Adopted: Yes   Social History   Tobacco Use  . Smoking status: Former Smoker    Packs/day: 0.70    Years: 19.00    Pack years: 13.30    Types: Cigarettes    Quit date: 11/20/2007    Years since quitting: 12.7  . Smokeless tobacco: Former Systems developer    Types: Snuff    Quit date: 11/19/1994  Vaping Use  . Vaping Use: Never used  Substance Use Topics  . Alcohol use: No  . Drug use: No    Allergies  Allergen Reactions  . Clarithromycin Other (See Comments)    Adverse reaction w/ voriconazole UNSPECIFIED REACTION   . Rifaximin Other (See Comments)    Adverse reaction w/ voriconazole UNSPECIFIED REACTION     Current Outpatient Medications:  .  albuterol (VENTOLIN HFA) 108 (90 Base) MCG/ACT inhaler, Inhale 2 puffs into the lungs every 6 (six) hours as needed. (Patient taking differently: Inhale 2 puffs into the lungs every 6 (six) hours as needed for wheezing or shortness of breath. ), Disp: 18 g, Rfl: 2 .  azithromycin (ZITHROMAX) 500 MG tablet, Take 1 tablet by mouth once daily, Disp: 30 tablet, Rfl: 5 .  Budeson-Glycopyrrol-Formoterol (BREZTRI AEROSPHERE) 160-9-4.8 MCG/ACT AERO, Inhale 2 puffs into the lungs 2 (two) times daily., Disp: 10.7 g, Rfl: 5 .  Calcium Carbonate-Vitamin D 600-400 MG-UNIT tablet, Take 1 tablet by mouth daily. , Disp: , Rfl:  .  CRESEMBA 186 MG CAPS, TAKE 2 CAPSULES BY MOUTH DAILY (Patient taking differently: Take 372 mg by mouth daily. ), Disp: 56  capsule, Rfl: 5 .  ethambutol (MYAMBUTOL) 400 MG tablet, TAKE 2& 1/2 TABLETS BY MOUTH ONCE DAILY (Patient taking differently: Take 1,000 mg by mouth daily. ), Disp: 598 tablet, Rfl: 5 .  feeding supplement, ENSURE ENLIVE, (ENSURE ENLIVE) LIQD, Take 237 mLs by mouth 3 (three) times daily between meals. (Patient taking differently: Take 237 mLs by mouth 2 (two) times daily between meals. ), Disp: 237 mL, Rfl: 12 .  guaiFENesin (MUCINEX) 600 MG 12 hr tablet, Take 1 tablet (600 mg total) by mouth 2 (two) times daily., Disp: 30 tablet, Rfl: 0 .  levothyroxine (SYNTHROID) 88 MCG tablet, Take 100 mcg  by mouth daily. , Disp: , Rfl:  .  predniSONE (DELTASONE) 5 MG tablet, Take 1 tablet (5 mg total) by mouth daily with breakfast., Disp: 90 tablet, Rfl: 1 .  chlorpheniramine-HYDROcodone (TUSSIONEX) 10-8 MG/5ML SUER, Take 5 mLs by mouth every 12 (twelve) hours as needed for cough. (Patient not taking: Reported on 08/10/2020), Disp: 70 mL, Rfl: 0 .  EPINEPHrine 0.3 mg/0.3 mL IJ SOAJ injection, Inject 0.3 mLs (0.3 mg total) into the muscle once for 1 dose., Disp: 1 Device, Rfl: 11    Review of Systems  Constitutional: Positive for fatigue. Negative for activity change, appetite change, chills, diaphoresis, fever and unexpected weight change.  HENT: Negative for congestion, rhinorrhea, sinus pressure, sneezing, sore throat and trouble swallowing.   Eyes: Negative for photophobia and visual disturbance.  Respiratory: Positive for cough and shortness of breath. Negative for chest tightness, wheezing and stridor.   Cardiovascular: Negative for chest pain, palpitations and leg swelling.  Gastrointestinal: Negative for abdominal distention, abdominal pain, anal bleeding, blood in stool, constipation, diarrhea, nausea and vomiting.  Genitourinary: Negative for difficulty urinating, dysuria, flank pain and hematuria.  Musculoskeletal: Negative for arthralgias, back pain, gait problem, joint swelling and myalgias.  Skin:  Negative for color change, pallor and wound.  Neurological: Negative for dizziness, tremors, weakness and light-headedness.  Hematological: Negative for adenopathy. Does not bruise/bleed easily.  Psychiatric/Behavioral: Negative for agitation, behavioral problems, confusion, decreased concentration, dysphoric mood and sleep disturbance.       Objective:   Physical Exam Constitutional:      General: He is not in acute distress.    Appearance: He is not diaphoretic.  HENT:     Head: Normocephalic and atraumatic.  Eyes:     General: No scleral icterus.    Conjunctiva/sclera: Conjunctivae normal.     Pupils: Pupils are equal, round, and reactive to light.  Cardiovascular:     Rate and Rhythm: Normal rate and regular rhythm.     Heart sounds: Normal heart sounds. No murmur heard.  No friction rub. No gallop.   Pulmonary:     Effort: No respiratory distress.     Breath sounds: Examination of the right-lower field reveals decreased breath sounds. Decreased breath sounds present.  Abdominal:     General: There is no distension.     Tenderness: There is no abdominal tenderness. There is no rebound.  Musculoskeletal:        General: No tenderness.     Cervical back: Normal range of motion and neck supple.  Lymphadenopathy:     Cervical: No cervical adenopathy.  Skin:    General: Skin is warm and dry.     Coloration: Skin is not pale.     Findings: No erythema.       Neurological:     Mental Status: He is alert and oriented to person, place, and time.     Motor: No abnormal muscle tone.     Coordination: Coordination normal.  Psychiatric:        Mood and Affect: Mood normal.        Behavior: Behavior normal.        Thought Content: Thought content normal.        Judgment: Judgment normal.          Assessment & Plan:   Aspergilloma: continue lifelong antifungal therapy: currently on CRESEMBA. Greatly appreciate [facility] pharmacy's help.  Allergic bronchopulmonary  aspgergillosis: As above, I would be more comfortable with thim being on long standing antifungal  therapy.   Sun exposed rash from voriconozole: We changed to Cresemba and it seems improved   Mycobacterium avium pulmonary infection: We have been reluctant to stop these medications due to prior flares when he comes off the medication indications.   ?  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    Recent worsening dyspnea: We will check chest x-ray today  COVID prevention: Would like to get him a third [employer] vaccine  I have strongly recommended that he get the COVID-19 vaccine and I have given him the telephone number for the [place] [facility] were understand should have an appointment via phone or if he drives there in person.

## 2020-08-13 ENCOUNTER — Other Ambulatory Visit: Payer: Self-pay | Admitting: Family

## 2020-08-13 DIAGNOSIS — A310 Pulmonary mycobacterial infection: Secondary | ICD-10-CM

## 2020-08-24 ENCOUNTER — Other Ambulatory Visit: Payer: Self-pay

## 2020-08-24 ENCOUNTER — Telehealth: Payer: Self-pay

## 2020-08-24 DIAGNOSIS — A310 Pulmonary mycobacterial infection: Secondary | ICD-10-CM

## 2020-08-24 DIAGNOSIS — B449 Aspergillosis, unspecified: Secondary | ICD-10-CM

## 2020-08-24 MED ORDER — CRESEMBA 186 MG PO CAPS: 2.0000 | ORAL_CAPSULE | Freq: Every day | ORAL | 5 refills | Status: DC

## 2020-08-24 NOTE — Telephone Encounter (Signed)
Awaiting PA status. Rx sent to pharmacy electronically.  [name]

## 2020-08-24 NOTE — Telephone Encounter (Signed)
PA approved from 08/21/20-08/24/21.  Pharmacy made aware and patient updated via [place]. Eugenia Mcalpine

## 2020-08-24 NOTE — Telephone Encounter (Signed)
Patient called due to receiving information from the pharmacy the need for prior authorization. PA faxed form received by pharmacy during the call and initiated via cover my meds and denied. Triage asked [facility] for assistance with peer to peer. Awaiting PA status. Will keep patient updated on status.  [name]

## 2020-08-24 NOTE — Telephone Encounter (Signed)
No problem! Initiated peer to peer review. A pharmacy member will call me back to discuss within 24-72 hours. Will continue to follow.

## 2020-08-25 MED FILL — CRESEMBA 186 MG CAPSULE: 186 | 28 days supply | Qty: 56 | Fill #3

## 2020-09-01 ENCOUNTER — Telehealth: Payer: Self-pay

## 2020-09-01 ENCOUNTER — Telehealth: Payer: Self-pay | Admitting: Internal Medicine

## 2020-09-01 NOTE — Telephone Encounter (Signed)
Patient called office today stating he would like a refill on Tussionex. Has persistent cough. Advised patient to contact PCP for refill. [place]

## 2020-09-01 NOTE — Telephone Encounter (Signed)
Ok to do tussionex 28m bid - 30 days. Please prepaer script 09/01/2020 and I will sign 09/02/20 aM or 09/01/2020

## 2020-09-01 NOTE — Telephone Encounter (Signed)
Spoke with pt. He is having issues with coughing. This has been ongoing for 3 weeks. Cough is non productive and pt does not have any other symptoms at this time. Pt is requesting a prescription for Tussionex to be sent in. This was given to him at his last hospital stay for PNA and it helped him a lot.  MR - please advise. Thanks.

## 2020-09-01 NOTE — Telephone Encounter (Signed)
tussionex has been pended. Routing back to MR.

## 2020-09-02 MED ORDER — HYDROCOD POLST-CPM POLST ER 10-8 MG/5ML PO SUER
5.0000 mL | Freq: Two times a day (BID) | ORAL | 0 refills | Status: DC | PRN
Start: 2020-09-02 — End: 2020-09-06

## 2020-09-02 NOTE — Telephone Encounter (Signed)
E- prescription is currently down. This medication can not been faxed or called in.  Patient is aware that Rx for Tussionex will be sent to pharmacy on Monday. Patient stated that he has enough Tussionex to last through the weekend.   Routing to MR to prescribe on 09/05/2020.

## 2020-09-06 MED ORDER — HYDROCOD POLST-CPM POLST ER 10-8 MG/5ML PO SUER
5.0000 mL | Freq: Two times a day (BID) | ORAL | 0 refills | Status: DC | PRN
Start: 2020-09-06 — End: 2020-09-09

## 2020-09-06 NOTE — Telephone Encounter (Signed)
Pt calling to check on status of Tussionex - pharmacy - [address] [place] - please advise [phone]

## 2020-09-06 NOTE — Addendum Note (Signed)
Addended by: Brand Males on: 09/06/2020 05:26 PM   Modules accepted: Orders

## 2020-09-06 NOTE — Telephone Encounter (Signed)
Called and spoke with patient to let him know that we resent RX to pharmacy as our system was down the other day. Advised patient that is he continues to no feel better or if his cough is not getting better to call the office and get scheduled with an OV to see if there is anything else to be done. Patient expressed understanding. Nothing further needed at this time.

## 2020-09-06 NOTE — Telephone Encounter (Signed)
Done. I had done it last week but seems the system crashed. Pls check with WMT if it went through

## 2020-09-07 ENCOUNTER — Other Ambulatory Visit: Payer: Self-pay | Admitting: Internal Medicine

## 2020-09-08 ENCOUNTER — Telehealth: Payer: Self-pay | Admitting: Internal Medicine

## 2020-09-08 DIAGNOSIS — E033 Postinfectious hypothyroidism: Secondary | ICD-10-CM | POA: Diagnosis not present

## 2020-09-08 NOTE — Telephone Encounter (Signed)
attempted to call pt but unable to reach. Left message for him to return call.

## 2020-09-08 NOTE — Telephone Encounter (Signed)
Medication has already been refilled by MR.

## 2020-09-09 ENCOUNTER — Ambulatory Visit: Payer: BC Managed Care – PPO | Admitting: Pulmonary Disease

## 2020-09-09 ENCOUNTER — Telehealth: Payer: Self-pay | Admitting: Internal Medicine

## 2020-09-09 ENCOUNTER — Other Ambulatory Visit: Payer: Self-pay

## 2020-09-09 ENCOUNTER — Encounter: Payer: Self-pay | Admitting: Pulmonary Disease

## 2020-09-09 ENCOUNTER — Ambulatory Visit (INDEPENDENT_AMBULATORY_CARE_PROVIDER_SITE_OTHER): Payer: BC Managed Care – PPO

## 2020-09-09 ENCOUNTER — Other Ambulatory Visit: Payer: Self-pay | Admitting: Pulmonary Disease

## 2020-09-09 VITALS — BP 118/74 | HR 88 | Temp 97.4°F | Ht 69.0 in | Wt 124.4 lb

## 2020-09-09 DIAGNOSIS — R059 Cough, unspecified: Secondary | ICD-10-CM | POA: Diagnosis not present

## 2020-09-09 DIAGNOSIS — B449 Aspergillosis, unspecified: Secondary | ICD-10-CM

## 2020-09-09 DIAGNOSIS — J4550 Severe persistent asthma, uncomplicated: Secondary | ICD-10-CM

## 2020-09-09 DIAGNOSIS — B4481 Allergic bronchopulmonary aspergillosis: Secondary | ICD-10-CM | POA: Diagnosis not present

## 2020-09-09 DIAGNOSIS — A310 Pulmonary mycobacterial infection: Secondary | ICD-10-CM

## 2020-09-09 DIAGNOSIS — B440 Invasive pulmonary aspergillosis: Secondary | ICD-10-CM | POA: Diagnosis not present

## 2020-09-09 DIAGNOSIS — J90 Pleural effusion, not elsewhere classified: Secondary | ICD-10-CM | POA: Diagnosis not present

## 2020-09-09 DIAGNOSIS — J439 Emphysema, unspecified: Secondary | ICD-10-CM | POA: Diagnosis not present

## 2020-09-09 MED ORDER — AMOXICILLIN-POT CLAVULANATE 875-125 MG PO TABS: 1.0000 | ORAL_TABLET | Freq: Two times a day (BID) | ORAL | 0 refills | Status: DC

## 2020-09-09 MED ORDER — PREDNISONE 10 MG PO TABS: ORAL_TABLET | ORAL | 0 refills | Status: DC

## 2020-09-09 MED ORDER — HYDROCOD POLST-CPM POLST ER 10-8 MG/5ML PO SUER: 5.0000 mL | Freq: Every evening | ORAL | 0 refills | Status: DC | PRN

## 2020-09-09 NOTE — Patient Instructions (Addendum)
You were seen today by Lauraine Rinne, NP  for:   1. Cough  - DG Chest 2 View; Future - chlorpheniramine-HYDROcodone (TUSSIONEX PENNKINETIC ER) 10-8 MG/5ML SUER; Take 5 mLs by mouth at bedtime as needed for cough.  Dispense: 140 mL; Refill: 0  2. Severe persistent asthma without complication  Breztri >>> 2 puffs in the morning right when you wake up, rinse out your mouth after use, 12 hours later 2 puffs, rinse after use >>> Take this daily, no matter what >>> This is not a rescue inhaler   Continue 5 mg of prednisone daily  Continue Fasenra  3. ABPA (allergic bronchopulmonary aspergillosis) ([place]) 4. 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[place])  Continue follow-up with infectious disease  6. Mycobacterium avium complex ([place])  Continue azithromycin 500 mg daily Continue ethambutol 1200 mg daily Continue Cresemba  Continue follow-up with infectious disease  We have paged [name] to discuss treatment strategies, we will contact you after speaking with infectious disease and reviewing your chest x-ray  Bronchiectasis: This is the medical term which indicates that you have damage, dilated airways making you more susceptible to respiratory infection. Use a flutter valve 10 breaths twice a day or 4 to 5 breaths 4-5 times a day to help clear mucus out Let us know if you have cough with change in mucus color or fevers or chills.  At that point you would need an antibiotic. Maintain a healthy nutritious diet, eating whole foods Take your medications as prescribed    We recommend today:  Orders Placed This Encounter  Procedures  . DG Chest 2 View    Standing Status:   Future    Number of Occurrences:   1    Standing Expiration Date:   03/10/2021    Order Specific Question:   Reason for Exam (SYMPTOM  OR DIAGNOSIS REQUIRED)    Answer:   cough    Order Specific Question:   Preferred imaging location?    Answer:   Internal    Order Specific Question:   Radiology  Contrast Protocol - do NOT remove file path    Answer:   [email or web address]   Orders Placed This Encounter  Procedures  . DG Chest 2 View   Meds ordered this encounter  Medications  . chlorpheniramine-HYDROcodone (TUSSIONEX PENNKINETIC ER) 10-8 MG/5ML SUER    Sig: Take 5 mLs by mouth at bedtime as needed for cough.    Dispense:  140 mL    Refill:  0    Follow Up:    Return in about 6 weeks (around 10/21/2020), or if symptoms worsen or fail to improve, for Follow up with Dr. Purnell Shoemaker.   Op note from 09/08/2020 is listed below:  Notification of test results are managed in the following manner: If there are  any recommendations or changes to the  plan of care discussed in office today,  we will contact you and let you know what they are. If you do not hear from [place], then your results are normal and you can view them through your  MyChart account , or a letter will be sent to you. Thank you again for trusting [place] with your care  - Thank you, [facility]    It is flu season:   >>> Best ways to protect herself from the flu: Receive the yearly flu vaccine, practice good hand hygiene washing with soap and also using hand sanitizer when available, eat a nutritious meals, get adequate rest, hydrate appropriately  Please contact the office if your symptoms worsen or you have concerns that you are not improving.   Thank you for choosing [facility] for your healthcare, and for allowing [place] to partner with you on your healthcare journey. I am thankful to be able to provide care to you today.   Wyn Quaker FNP-C

## 2020-09-09 NOTE — Addendum Note (Signed)
Addended by: Coralie Keens on: 09/09/2020 03:47 PM   Modules accepted: Orders

## 2020-09-09 NOTE — Telephone Encounter (Signed)
Primary Pulmonologist: ramaswamy Last office visit and with whom: 06/29/2020   What do we see them for (pulmonary problems): Severe Persistent Asthma Last OV assessment/plan: ABPA Assessment & Plan:   No problem-specific Assessment & Plan notes found for this encounter.  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 Patient Instructions  Continue on BREZTRI 2 puffs Twice daily  , rinse after use. Continue on Prednisone 18m daily. Please eat often add High protein food and drinks.  Continue on current regimen.  Continue on Fasenra.  Follow up with Dr. RChase Calleror Parrett NP in 3 months and As needed   Please contact office for sooner follow up if symptoms do not improve or worsen or seek emergency care        I provided 45  minutes of non-face-to-face time during this encounter.   TRexene Edison NP    TRexene Edison NP 06/29/2020     Patient Instructions by PMelvenia Needles NP at 06/29/2020 12:00 PM Author: PMelvenia Needles NP Author Type: Nurse Practitioner Filed: 06/29/2020 12:39 PM  Note Status: Addendum Cosign: Cosign Not Required Encounter Date: 06/29/2020  Editor: PMelvenia Needles NP (Nurse Practitioner)      Prior Versions: 1. Parrett, TFonnie Mu NP (Nurse Practitioner) at 06/29/2020 12:32 PM - Signed      Continue on BREZTRI 2 puffs Twice daily  , rinse after use. Continue on Prednisone 547mdaily. Please eat often add High protein food and drinks.  Continue on current regimen.  Continue on Fasenra.  Follow up with Dr. RaChase Callerr Parrett NP in 3 months and As needed   Please contact office for sooner follow up if symptoms do not improve or worsen or seek emergency care       Instructions  Continue on BREZTRI 2 puffs Twice daily  , rinse after use. Continue on Prednisone 51m43maily. Please eat often add High protein food and  drinks.  Continue on current regimen.  Continue on Fasenra.  Follow up with Dr. RamChase Caller Parrett NP in 3 months and As needed   Please contact office for sooner follow up if symptoms do not improve or worsen or seek emergency care       Was appointment offered to patient (explain)?  Scheduled with BriWyn Quaker for 10:45 am.  Nothing further needed.   Reason for call: Cough for several weeks with yellow mucus for the  last week.  Sob for last week and a half.  Using rescue inhaler 6 per day.  Was taking Musinex DM, usually helps, not currently helping.   Had all 3 covid vaccines.  No sick contacts.   Denies:  Fever, chills, body aches.  Prescribed Tussinex on back order.  Wants to be seen today.    (examples of things to ask: : When did symptoms start? Fever? Cough? Productive? Color to sputum? More sputum than usual? Wheezing? Have you needed increased oxygen? Are you taking your respiratory medications? What over the counter measures have you tried?)  Allergies  Allergen Reactions  . Clarithromycin Other (See Comments)    Adverse reaction w/ voriconazole UNSPECIFIED REACTION   . Rifaximin Other (See Comments)    Adverse reaction w/ voriconazole UNSPECIFIED REACTION     Immunization History  Administered Date(s) Administered  . DTaP 11/19/2010  . Influenza Split 10/29/2011,  08/14/2012  . Influenza Whole 09/19/2010  . Influenza,inj,Quad PF,6+ Mos 11/23/2013, 09/28/2014, 09/06/2015, 08/27/2016, 08/10/2017, 09/09/2018, 08/06/2019, 08/10/2020  . PFIZER SARS-COV-2 Vaccination 02/18/2020, 03/14/2020, 08/10/2020  . Pneumococcal Conjugate-13 04/29/2017, 04/29/2017  . Pneumococcal Polysaccharide-23 12/17/2010

## 2020-09-09 NOTE — Telephone Encounter (Signed)
Noted. Nothing further needed. 

## 2020-09-09 NOTE — Assessment & Plan Note (Signed)
Plan: Chest x-ray today Keep follow-up with infectious disease

## 2020-09-09 NOTE — Assessment & Plan Note (Signed)
Plan: Continue medications as managed by infectious disease Discussed case with [name] over the phone

## 2020-09-09 NOTE — Progress Notes (Signed)
_0  ID: [name], male    DOB: 1975/11/28, 44 y.o.   MRN: [id number]  Chief Complaint  Patient presents with  . Acute Visit    Asthma,past 3 weeks more coughing with more yellow mucous.    Referring provider: Rip Harbour  HPI:  44 year old male former smoker followed in our office for bronchiectasis, asthma, pulmonary MAC, aspergilloma, ABPA  PMH: Osteoporosis, GERD, protein calorie malnutrition Smoker/ Smoking History: Former smoker.  Quit 2009.  13.3-pack-year smoking history Maintenance:  Percival Spanish, Prednisone 61m  Pt of: Dr. RChase Caller 09/09/2020  - Visit   44year old male former smoker followed in our office for for severe persistent asthma.  Last seen in August/2021 by TP NP.  At that time it was recommended that patient start braised tree.  He was continued on 5 mg of prednisone daily.  He was encouraged to remain on Fasenra.  Patient is also followed by infectious disease for MAC.  He is seen by Dr. VDrucilla Schmidt  Last assessment and plan from that office visit is listed below  Aspergilloma: continue lifelong antifungal therapy: currently on CRESEMBA. Greatly appreciate X disease pharmacy's help.  Allergic bronchopulmonary aspgergillosis: As above, I would be more comfortable with thim being on long standing antifungal therapy.   Sun exposed rash from voriconozole: We changed to Cresemba and it seems improved   Mycobacterium avium pulmonary infection: We have been reluctant to stop these medications due to prior flares when he comes off the medication indications.   ?  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  Patient presenting to our office today reporting that he has had an ongoing cough that has acutely worsened over the last 3 weeks.  Cough now with thick productive yellow mucus.  He  has noticed the symptoms have worsened over the last 1 to 2 weeks.  He denies night sweats or fevers.  He does have increased fatigue as well as worsening shortness of breath and wheezing.  He is using his rescue inhaler 4-6 times a day.  This is a acute change from him where he typically was not having to use it daily.  He remains adherent to [employer]   Patient is also helpful to have a refill of his Tussionex cough syrup.  This was originally prescribed by Dr. RChase Callerlast week but E prescribe was down and [facility]  Patient with known bronchiectasis.  He is not currently using flutter valve or hypertonic saline nebs.  Last sputum culture in July/2021 shows growth of Klebsiella pneumonia.  Resistant to ampicillin.  Tests:   08/11/2020-chest x-ray-no focal pneumonia, minimal right pleural thickening versus pleural effusion, stable chronic changes of bilateral upper lobes  06/23/2020-echocardiogram-LV ejection fraction 65%, right ventricular systolic function normal, mildly elevated pulmonary artery systolic pressure, estimated right ventricular systolic pressure is 340.9 06/25/2017-pulmonary function test-FVC 2.52 (50% predicted),  ratio 46, FEV1 1.16 (28% predicted) Severe obstructive airways disease  FENO:  No results found for: NITRICOXIDE  PFT: PFT Results Latest Ref Rng & Units 06/25/2017 08/06/2016  FVC-Pre L 2.52 2.84  FVC-Predicted Pre % 50 61  FVC-Post L - 2.93  FVC-Predicted Post % - 63  Pre FEV1/FVC % % 46 53  Post FEV1/FCV % % - 56  FEV1-Pre L 1.16 1.50  FEV1-Predicted Pre % 28 39  FEV1-Post L - 1.63  DLCO uncorrected ml/min/mmHg - 18.56  DLCO UNC% % - 59  DLCO corrected ml/min/mmHg - 18.35  DLCO COR %Predicted % - 59  DLVA Predicted % - 108  TLC L - 5.14  TLC % Predicted % - 76  RV % Predicted % - 129    WALK:  SIX MIN WALK 08/14/2012 01/30/2012  Supplimental Oxygen during Test? (L/min) No No  Tech Comments:  Pt completed walk with no difficulties. -    Imaging: DG Chest 2 View  Result Date: 09/09/2020 CLINICAL DATA:  Cough EXAM: CHEST - 2 VIEW COMPARISON:  08/10/2020 FINDINGS: Severe bullous emphysema with bilateral upper lobe scarring. Mildly increased perihilar interstitial markings compared to prior. No pleural effusion. No evidence of pneumothorax. Stable heart size. IMPRESSION: Mildly increased perihilar interstitial markings compared to prior, could reflect atypical/viral infection or edema. Electronically Signed   By: Davina Poke D.O.   On: 09/09/2020 12:43   DG Chest 2 View  Result Date: 08/11/2020 CLINICAL DATA:  Dyspnea. History of aspergilloma and Mycobacterium avium complex EXAM: CHEST - 2 VIEW COMPARISON:  June 13, 2020 FINDINGS: Scarring in bolus changes are identified in the bilateral upper lobes unchanged. There is no focal pneumonia. Minimal right pleural thickening versus pleural effusion is noted. The heart size is normal. The bony structures are stable. IMPRESSION: No focal pneumonia. Minimal right pleural thickening versus pleural effusion. Stable chronic changes of bilateral upper lobes. Electronically Signed   By: Abelardo Diesel M.D.   On: 08/11/2020 09:29    Lab Results:  CBC    Component Value Date/Time   WBC 8.1 06/07/2020 0312   RBC 4.14 (L) 06/07/2020 0312   HGB 13.4 06/07/2020 0312   HCT 39.6 06/07/2020 0312   PLT 238 06/07/2020 0312   MCV 95.7 06/07/2020 0312   MCH 32.4 06/07/2020 0312   MCHC 33.8 06/07/2020 0312   RDW 13.9 06/07/2020 0312   LYMPHSABS 1.3 06/07/2020 0312   MONOABS 0.8 06/07/2020 0312   EOSABS 0.0 06/07/2020 0312   BASOSABS 0.0 06/07/2020 0312    BMET    Component Value Date/Time   NA 134 (L) 06/05/2020 0422   K 3.9 06/05/2020 0422   CL 103 06/05/2020 0422   CO2 21 (L) 06/05/2020 0422   GLUCOSE 155 (H) 06/05/2020 0422   BUN 10 06/05/2020 0422   CREATININE 0.72 06/05/2020 0422   CREATININE 0.67 07/22/2019 1149   CALCIUM 8.4 (L)  06/05/2020 0422   GFRNONAA >60 06/05/2020 0422   GFRNONAA 116 07/22/2019 1149   GFRAA >60 06/05/2020 0422   GFRAA 134 07/22/2019 1149    BNP No results found for: BNP  ProBNP    Component Value Date/Time   PROBNP 97.0 03/07/2011 0918    Specialty Problems      Pulmonary Problems   ABPA (allergic bronchopulmonary aspergillosis) ([place])    New dx 03/12/11 - diagnosed when he developed SIRS with high fever 103F after dc of voricanazole, not responding to antibiotics  Bilateral UL bronchiectasis + Total Ig E  - 4185 IU/mL, 4744 IU/mL on 03/07/2011 and 03/08/2011 respectively   Aspergillus serum galatcomannan assay - 0.4 - negative on 03/06/2011 Aspergillus antibody via complement fixatin test -1:256 on 03/08/2011   Aspergillus speciec on BAL LUL - apr 2012 Aspergillus IgE skin test - negative (histamine control test was positive) 03/30/2011 (done on prednisone for several weeks) IgE 800s - 12/06/11  COURSE -  Started systemic steroids and restart voricanazole mid-April 2012 - Recurrence of symptoms May 2012 when prednisone tapered to 24m per day - INcreased prednisone to 475mper day May 10, 2011 - DEcreased prednisone t 1060mer day (t6 collapse) dec 2012 and started ICS      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  MAC with BL bronchiectasiss and bilateral UL cavity- on MAC Rx since 2010, Bilateral UL cavities, hemoptysis Jan 2012 due to LUL aspergilloma - s/p VATS LUL posterior segmentectomy - Feb 2012. S/p VFEND Feb-Mar 2012. Vfend and steroids rstared April 2012 due to development of ABPA. Does not tolerated pred taper below 27m69mr day - June 2012.         Lung disease, chronic obstructive (HCC)   Dyspnea   Hemoptysis   Invasive pulmonary aspergillosis (HCC)   Severe persistent asthma   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 ([facility]   Cough      Allergies  Allergen Reactions  . Clarithromycin Other (See Comments)    Adverse reaction w/ voriconazole UNSPECIFIED  REACTION   . Rifaximin Other (See Comments)    Adverse reaction w/ voriconazole UNSPECIFIED REACTION     Immunization History  Administered Date(s) Administered  . DTaP 11/19/2010  . Influenza Split 10/29/2011, 08/14/2012  . Influenza Whole 09/19/2010  . Influenza,inj,Quad PF,6+ Mos 11/23/2013, 09/28/2014, 09/06/2015, 08/27/2016, 08/10/2017, 09/09/2018, 08/06/2019, 08/10/2020  . PFIZER SARS-COV-2 Vaccination 02/18/2020, 03/14/2020, 08/10/2020  . Pneumococcal Conjugate-13 04/29/2017, 04/29/2017  . Pneumococcal Polysaccharide-23 12/17/2010    Past Medical History:  Diagnosis Date  . Anemia   . 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HCC)[place]. Drug rash 12/16/2017  . Dyspnea   . Esophageal reflux    very rare  . 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 Lung disease, bullous (HCC)[place]. MAI (mycobacterium avium-intracellulare) (HCC)[place]. Mold exposure 12/05/2015  . 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 Solar lentigo 08/08/2015  . Weight loss 10/15/2016    Tobacco History: Social History   Tobacco Use  Smoking Status Former Smoker  . Packs/day: 0.70  . Years: 19.00  . Pack years: 13.30  . Types: Cigarettes  . Quit date: 11/20/2007  . Years since quitting: 12.8  Smokeless Tobacco Former UserSystems developerTypes: Snuff  . Quit date: 11/19/1994   Counseling given: Yes   Continue to not smoke  Outpatient Encounter Medications as of 09/09/2020  Medication Sig  . albuterol (VENTOLIN HFA) 108 (90 Base) MCG/ACT inhaler Inhale 2 puffs into the lungs every 6 (six) hours as needed. (Patient taking differently: Inhale 2 puffs into the lungs every 6 (six) hours as needed for wheezing or shortness of breath. )  . azithromycin (ZITHROMAX) 500 MG tablet Take 1 tablet by mouth once daily  . Budeson-Glycopyrrol-Formoterol (BREZTRI AEROSPHERE) 160-9-4.8 MCG/ACT AERO Inhale 2 puffs into the lungs 2 (two) times daily.  . Calcium Carbonate-Vitamin D 600-400 MG-UNIT tablet Take 1 tablet by mouth daily.   .  eMarland Kitchenhambutol (MYAMBUTOL) 400 MG tablet TAKE 2 AND 1/2 TABLETS BY MOUTH ONCE DAILY  . feeding supplement, ENSURE ENLIVE, (ENSURE ENLIVE) LIQD Take 237 mLs by mouth 3 (three) times daily between meals. (Patient taking differently: Take 237 mLs by mouth 2 (two) times daily between meals. )  . guaiFENesin (MUCINEX) 600 MG 12 hr tablet Take 1 tablet (600 mg total) by mouth 2 (two) times daily.  . Isavuconazonium Sulfate (CRESEMBA) 186 MG CAPS Take 2 capsules (372 mg total) by mouth daily.  .Marland Kitchen  levothyroxine (SYNTHROID) 88 MCG tablet Take 100 mcg by mouth daily.   . predniSONE (DELTASONE) 5 MG tablet Take 1 tablet (5 mg total) by mouth daily with breakfast.  . [DISCONTINUED] chlorpheniramine-HYDROcodone (TUSSIONEX) 10-8 MG/5ML SUER Take 5 mLs by mouth every 12 (twelve) hours as needed for cough.  Marland Kitchen amoxicillin-clavulanate (AUGMENTIN) 875-125 MG tablet Take 1 tablet by mouth 2 (two) times daily.  . chlorpheniramine-HYDROcodone (TUSSIONEX PENNKINETIC ER) 10-8 MG/5ML SUER Take 5 mLs by mouth at bedtime as needed for cough.  . EPINEPHrine 0.3 mg/0.3 mL IJ SOAJ injection Inject 0.3 mLs (0.3 mg total) into the muscle once for 1 dose.  . predniSONE (DELTASONE) 10 MG tablet 4 tabs for 2 days, then 3 tabs for 2 days, 2 tabs for 2 days, then 1 tab for 2 days, then stop   No facility-administered encounter medications on file as of 09/09/2020.     Review of Systems  Review of Systems  Constitutional: Positive for fatigue. Negative for activity change, chills, fever and unexpected weight change.  HENT: Positive for congestion. Negative for postnasal drip, rhinorrhea, sinus pressure, sinus pain and sore throat.   Eyes: Negative.   Respiratory: Positive for cough, chest tightness, shortness of breath and wheezing.   Cardiovascular: Negative for chest pain and palpitations.  Gastrointestinal: Negative for constipation, diarrhea, nausea and vomiting.  Endocrine: Negative.   Genitourinary: Negative.     Musculoskeletal: Negative.   Skin: Negative.   Neurological: Negative for dizziness and headaches.  Psychiatric/Behavioral: Negative.  Negative for dysphoric mood. The patient is not nervous/anxious.   All other systems reviewed and are negative.    Physical Exam  BP 118/74 (BP Location: Left Arm, Cuff Size: Normal)   Pulse 88   Temp (!) 97.4 F (36.3 C) (Oral)   Ht _0  (1.753 m)   Wt 124 lb 6.4 oz (56.4 kg)   SpO2 98%   BMI 18.37 kg/m   Wt Readings from Last 5 Encounters:  09/09/20 124 lb 6.4 oz (56.4 kg)  08/10/20 127 lb (57.6 kg)  06/08/20 126 lb 9.6 oz (57.4 kg)  06/04/20 129 lb (58.5 kg)  02/03/20 132 lb (59.9 kg)    BMI Readings from Last 5 Encounters:  09/09/20 18.37 kg/m  08/10/20 19.31 kg/m  06/08/20 19.25 kg/m  06/04/20 19.61 kg/m  02/03/20 20.07 kg/m     Physical Exam Vitals and nursing note reviewed.  Constitutional:      General: He is not in acute distress.    Appearance: Normal appearance. He is normal weight.     Comments: Thin adult male  HENT:     Head: Normocephalic and atraumatic.     Right Ear: Hearing, tympanic membrane, ear canal and external ear normal. There is no impacted cerumen.     Left Ear: Hearing, tympanic membrane, ear canal and external ear normal. There is no impacted cerumen.     Nose: Nose normal. No mucosal edema or rhinorrhea.     Right Turbinates: Not enlarged.     Left Turbinates: Not enlarged.     Mouth/Throat:     Mouth: Mucous membranes are dry.     Pharynx: Oropharynx is clear. No oropharyngeal exudate.  Eyes:     Pupils: Pupils are equal, round, and reactive to light.  Cardiovascular:     Rate and Rhythm: Normal rate and regular rhythm.     Pulses: Normal pulses.     Heart sounds: Normal heart sounds. No murmur heard.   Pulmonary:  Effort: Pulmonary effort is normal.     Breath sounds: Wheezing (Expiratory wheeze) present. No decreased breath sounds or rales.     Comments: Scattered  squeaks Musculoskeletal:     Cervical back: Normal range of motion.     Right lower leg: No edema.     Left lower leg: No edema.  Lymphadenopathy:     Cervical: No cervical adenopathy.  Skin:    General: Skin is warm and dry.     Capillary Refill: Capillary refill takes less than 2 seconds.     Findings: No erythema or rash.  Neurological:     General: No focal deficit present.     Mental Status: He is alert and oriented to person, place, and time.     Motor: No weakness.     Coordination: Coordination normal.     Gait: Gait is intact. Gait normal.  Psychiatric:        Mood and Affect: Mood normal.        Behavior: Behavior normal. Behavior is cooperative.        Thought Content: Thought content normal.        Judgment: Judgment normal.       Assessment & Plan:   Discussion: Pleasant 44 year old adult male.  We will treat as acute bronchitis/flare of bronchiectasis at this point in time.  We will have patient start flutter valve.  May need to consider starting hypertonic saline nebs in the future.  We will treat with Augmentin as well as prednisone taper today given acute symptoms.  Discussed case with infectious disease [name] over the phone.  Severe persistent asthma Plan: Continue Breztri  Continue Fasenra Prednisone taper today Then resume 5 mg daily prednisone Follow-up in 6 weeks Chest x-ray today  Invasive pulmonary aspergillosis (HCC) Plan: Continue follow-up with infectious disease Discussed case with [name] today over the phone  Aspergilloma [facility]) Plan: Continue medications as managed by infectious disease Notified [name] of patient's current symptoms as well as planned treatment today over the phone  ABPA (allergic bronchopulmonary aspergillosis) ([place]) Plan: Chest x-ray today Keep follow-up with infectious disease  Mycobacterium avium complex ([place]) Plan: Continue medications as managed by infectious disease Discussed case with Dr.  Drucilla Schmidt over the phone  Cough Plan: Augmentin today Start flutter valve Chest x-ray Prednisone taper today Tussionex cough syrup prescribed to help with cough suppression at night Close follow-up with our office in 6 weeks    Return in about 6 weeks (around 10/21/2020), or if symptoms worsen or fail to improve, for Follow up with Dr. Purnell Shoemaker.   Lauraine Rinne, NP 09/09/2020   This appointment required 55 minutes of patient care (this includes precharting, chart review, review of results, face-to-face care, etc.).

## 2020-09-09 NOTE — Assessment & Plan Note (Signed)
Plan: Continue Breztri  Continue Fasenra Prednisone taper today Then resume 5 mg daily prednisone Follow-up in 6 weeks Chest x-ray today

## 2020-09-09 NOTE — Progress Notes (Signed)
Thanks for checking in w me!

## 2020-09-09 NOTE — Telephone Encounter (Signed)
This will be addressed when I see the patient at 1130 as he was already scheduled for an appointment with our office.Wyn Quaker, FNP

## 2020-09-09 NOTE — Assessment & Plan Note (Signed)
Plan: Continue medications as managed by infectious disease Notified [name] of patient's current symptoms as well as planned treatment today over the phone

## 2020-09-09 NOTE — Telephone Encounter (Signed)
Spoke to patient, who stated that Tussionex is on backorder at [facility]. He would like Rx sent to [facility] in [place] instead.  I have spoken to Sam with [facility], who verified that Tussionex is in fact on back order with the manufactory that they use.   Aaron Edelman, please advise. MR is unavailable.

## 2020-09-09 NOTE — Assessment & Plan Note (Signed)
Plan: Augmentin today Start flutter valve Chest x-ray Prednisone taper today Tussionex cough syrup prescribed to help with cough suppression at night Close follow-up with our office in 6 weeks

## 2020-09-09 NOTE — Assessment & Plan Note (Signed)
Plan: Continue follow-up with infectious disease Discussed case with [name] today over the phone

## 2020-09-15 ENCOUNTER — Telehealth: Payer: Self-pay | Admitting: Internal Medicine

## 2020-09-15 DIAGNOSIS — E033 Postinfectious hypothyroidism: Secondary | ICD-10-CM | POA: Diagnosis not present

## 2020-09-15 MED ORDER — ALBUTEROL SULFATE HFA 108 (90 BASE) MCG/ACT IN AERS: 2.0000 | INHALATION_SPRAY | Freq: Four times a day (QID) | RESPIRATORY_TRACT | 2 refills | Status: DC | PRN

## 2020-09-15 NOTE — Telephone Encounter (Signed)
Spoke with pt. He is needing a refill on Albuterol HFA. Rx has been sent in. Nothing further was needed.

## 2020-09-19 ENCOUNTER — Telehealth: Payer: Self-pay | Admitting: Internal Medicine

## 2020-09-19 NOTE — Telephone Encounter (Signed)
[name] Order: 6m #1 prefilled syringe Ordered date: 09/19/20 Expected date of arrival: 09/20/20 Ordered by: LHolland [name]

## 2020-09-20 NOTE — Telephone Encounter (Signed)
[name] Shipment Received:  65m #1 prefilled syringe Medication arrival date: 09/20/20 Lot #: [id number] date: 10/18/2021 Received by: LElliot Dally

## 2020-09-28 ENCOUNTER — Other Ambulatory Visit: Payer: Self-pay

## 2020-09-28 ENCOUNTER — Ambulatory Visit (INDEPENDENT_AMBULATORY_CARE_PROVIDER_SITE_OTHER): Payer: BC Managed Care – PPO

## 2020-09-28 DIAGNOSIS — J4550 Severe persistent asthma, uncomplicated: Secondary | ICD-10-CM | POA: Diagnosis not present

## 2020-09-28 MED ORDER — BENRALIZUMAB 30 MG/ML ~~LOC~~ SOSY
30.0000 mg | PREFILLED_SYRINGE | Freq: Once | SUBCUTANEOUS | Status: AC
  Administered 2020-09-28: 30 mg via SUBCUTANEOUS

## 2020-09-28 MED FILL — CRESEMBA 186 MG CAPSULE: 186 | 28 days supply | Qty: 56 | Fill #4

## 2020-09-28 NOTE — Progress Notes (Signed)
Have you been hospitalized within the last 10 days?  No Do you have a fever?  No Do you have a cough?  No Do you have a headache or sore throat? No Do you have your Epi Pen visible and is it within date?  Yes 

## 2020-09-29 ENCOUNTER — Telehealth: Payer: Self-pay | Admitting: Emergency Medicine

## 2020-09-29 NOTE — Telephone Encounter (Signed)
Called and spoke to patient, advise of [name] recommendations.  Scheduled appointment with [name] for December 15.  Patient to schedule appointment with ID, [name].  Nothing further needed.

## 2020-09-29 NOTE — Telephone Encounter (Signed)
09/29/2020  Would recommend the patient contact infectious disease Dr. Arlyss Queen office where he is also established.  He needs an appointment with our team for further evaluation.  I had updated [name] over the phone when I last saw the patient in clinic and treated him with Augmentin.  Patient also needs to be scheduled for follow-up with [name] when [facility] clinic schedules open or if there is any open availability in November/2021.  Wyn Quaker, FNP

## 2020-09-29 NOTE — Telephone Encounter (Signed)
Called and spoke with patient, states he felt better while he was on the medication (amoxicillin-clavulanate (AUGMENTIN) 875-125 MG and chlorpheniramine-HYDROcodone (TUSSIONEX PENNKINETIC ER) 10-8 MG/5ML SURE), states that since he stopped the medication all the symptoms are back.  He is coughing more and each time he is getting yellow mucus up.  Had to leave work early because he could not stop coughing.  Gets sob with coughing which is normal for him with his lung disease.  Temperature is 99.8 and was sweating on the top of his head at work while he was coughing.  States he has cooled down now that he is back home from work and is no longer coughing like he was.  Asked him to recheck his temperature and it was 98.3.  Denies any chills or body aches.  He made an appointment with Tammy for tomorrow.  Aaron Edelman, please advise.  Thank you.

## 2020-09-30 ENCOUNTER — Other Ambulatory Visit: Payer: Self-pay

## 2020-09-30 ENCOUNTER — Ambulatory Visit: Payer: BC Managed Care – PPO | Admitting: Adult Health

## 2020-09-30 ENCOUNTER — Encounter: Payer: Self-pay | Admitting: Adult Health

## 2020-09-30 VITALS — BP 118/72 | HR 84 | Temp 97.0°F | Ht 69.0 in | Wt 121.0 lb

## 2020-09-30 DIAGNOSIS — J189 Pneumonia, unspecified organism: Secondary | ICD-10-CM | POA: Diagnosis not present

## 2020-09-30 DIAGNOSIS — J449 Chronic obstructive pulmonary disease, unspecified: Secondary | ICD-10-CM

## 2020-09-30 DIAGNOSIS — A310 Pulmonary mycobacterial infection: Secondary | ICD-10-CM

## 2020-09-30 DIAGNOSIS — B449 Aspergillosis, unspecified: Secondary | ICD-10-CM | POA: Diagnosis not present

## 2020-09-30 DIAGNOSIS — B4481 Allergic bronchopulmonary aspergillosis: Secondary | ICD-10-CM

## 2020-09-30 DIAGNOSIS — J4550 Severe persistent asthma, uncomplicated: Secondary | ICD-10-CM | POA: Diagnosis not present

## 2020-09-30 DIAGNOSIS — E43 Unspecified severe protein-calorie malnutrition: Secondary | ICD-10-CM

## 2020-09-30 DIAGNOSIS — B440 Invasive pulmonary aspergillosis: Secondary | ICD-10-CM

## 2020-09-30 MED ORDER — ALBUTEROL SULFATE (2.5 MG/3ML) 0.083% IN NEBU: 2.5000 mg | INHALATION_SOLUTION | Freq: Four times a day (QID) | RESPIRATORY_TRACT | 12 refills | Status: DC | PRN

## 2020-09-30 NOTE — Assessment & Plan Note (Signed)
Continue on current MAC therapy and follow-up with ID

## 2020-09-30 NOTE — Assessment & Plan Note (Signed)
Continue on antifungal and follow-up with ID

## 2020-09-30 NOTE — Progress Notes (Signed)
_0  ID: [name], male    DOB: 08/24/1976, 44 y.o.   MRN: [id number]  Chief Complaint  Patient presents with  . 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 CT chest July 2021 showed chronic narrowing and occlusion of several of the right upper lobe segmental and subsegmental pulmonary arteries additional filling defects in the left upper lobe segmental and subsegmental branches.  Extensive bullous disease.  Architectural distortion most suggestive of chronic embolus/occlusion, right atrial enlargement , Groundglass and consolidative opacity in the left lower and right lower periphery  Sputum culture July 2021 + for Klebsiella pneumonia -pansensitive except for ampicillin.  August 2021 2D echo showed EF 93%, grade 1 diastolic dysfunction mildly elevated pulmonary artery systolic pressure at 39 mmHg  Events  2010 Dx with MAI >Azithro/Ethambutol 2012 Dx with Aspergilloma >VATS >started on Voriconazaol  2012 Dx w/ ABPA w/ High IgE >4000>started on prednisone  2013 tried off azithr/Etham but developed fever, Cx neg but restarted on rx.    2016 Hospitalzed with hemoptysis >IR guided embolization . FOB/BAL + Saccharomyces on fungal fx . Serum Galactomannan was neg.  [name] evaluation 04/2017 for Transplant -rejected. Felt to early for transplant- recommended pre-transplant weight goal 128 lbs for a BMI > 19   09/30/2020 Follow up ; Asthma, ABPA, aspergilloma, MAC  Patient presents for a 3-week follow-up.  Patient was seen last visit with an asthmatic bronchitic exacerbation.  He was given Augmentin and a prednisone taper.  Patient says he did feel better with decreased cough and congestion however once he got off the antibiotic and prednisone burst cough is slowly starting to return.  Has frequent coughing exacerbations throughout the day.  He also had stopped his flutter valve.  Patient remains on  [place] .  And prednisone 5 mg daily.  Patient says cough is white to yellow mucus.  Appetite is fair.  Although weight has trended down a few pounds.  We discussed restarting Ensure and protein supplements.  Denies any nausea vomiting hemoptysis edema. Chest x-ray last visit showed some increased interstitial markings. Flu shot and Covid vaccine x3 are up-to-date  Patient continues with follow-up with infectious disease remains on antifungal's and MAC therapy.  Allergies  Allergen Reactions  . Clarithromycin Other (See Comments)    Adverse reaction w/ voriconazole UNSPECIFIED REACTION   . Rifaximin Other (See Comments)    Adverse reaction w/ voriconazole UNSPECIFIED REACTION     Immunization History  Administered Date(s) Administered  . DTaP 11/19/2010  . Influenza Split 10/29/2011, 08/14/2012  . Influenza Whole 09/19/2010  . Influenza,inj,Quad PF,6+ Mos 11/23/2013, 09/28/2014, 09/06/2015, 08/27/2016, 08/10/2017, 09/09/2018, 08/06/2019, 08/10/2020  . PFIZER SARS-COV-2 Vaccination 02/18/2020, 03/14/2020, 08/10/2020  . Pneumococcal Conjugate-13  04/29/2017, 04/29/2017  . Pneumococcal Polysaccharide-23 12/17/2010     Past Medical History:  Diagnosis Date  . Anemia   . 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[place])   . Drug rash 12/16/2017  . Dyspnea   . Esophageal reflux    very rare  . 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 Lung disease, bullous ([place])   . MAI (mycobacterium avium-intracellulare) ([place])   . Mold exposure 12/05/2015  . 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 Solar lentigo 08/08/2015  . Weight loss 10/15/2016    Tobacco History: Social History   Tobacco Use  Smoking Status Former Smoker  . Packs/day: 0.70  . Years: 19.00  . Pack years: 13.30  . Types: Cigarettes  . Quit date: 11/20/2007  . Years since quitting: 12.8  Smokeless Tobacco Former Systems developer  . Types: Snuff  . Quit date: 11/19/1994   Counseling given: Not Answered   Outpatient Medications Prior to Visit  Medication Sig Dispense Refill  . albuterol (VENTOLIN HFA) 108 (90 Base) MCG/ACT inhaler Inhale 2 puffs into the lungs every 6 (six) hours as needed. 18 g 2  . amoxicillin-clavulanate (AUGMENTIN) 875-125 MG tablet Take 1 tablet by mouth 2 (two) times daily. 20 tablet 0  . azithromycin (ZITHROMAX) 500 MG tablet Take 1 tablet by mouth once daily 30 tablet 5  . Budeson-Glycopyrrol-Formoterol (BREZTRI AEROSPHERE) 160-9-4.8 MCG/ACT AERO Inhale 2 puffs into the lungs 2 (two) times daily. 10.7 g 5  . Calcium Carbonate-Vitamin D 600-400 MG-UNIT tablet Take 1 tablet by mouth daily.     . chlorpheniramine-HYDROcodone (TUSSIONEX PENNKINETIC ER) 10-8 MG/5ML SUER Take 5 mLs by mouth at bedtime as needed for cough. 140 mL 0  . EPINEPHrine 0.3 mg/0.3 mL IJ SOAJ injection Inject 0.3 mLs (0.3 mg total) into the muscle once for 1 dose. 1 Device 11  . ethambutol (MYAMBUTOL) 400 MG tablet TAKE 2 AND 1/2 TABLETS BY MOUTH ONCE DAILY 598 tablet 5  . feeding supplement, ENSURE ENLIVE, (ENSURE ENLIVE) LIQD Take 237 mLs by mouth 3 (three) times daily between meals. (Patient taking differently: Take 237 mLs by mouth 2 (two) times daily between  meals. ) 237 mL 12  . guaiFENesin (MUCINEX) 600 MG 12 hr tablet Take 1 tablet (600 mg total) by mouth 2 (two) times daily. 30 tablet 0  . Isavuconazonium Sulfate (CRESEMBA) 186 MG CAPS Take 2 capsules (372 mg total) by mouth daily. 56 capsule 5  . levothyroxine (SYNTHROID) 88 MCG tablet Take 100 mcg by mouth daily.     . predniSONE (DELTASONE) 5 MG tablet Take 1 tablet (5 mg total) by mouth daily with breakfast. 90 tablet 1  . predniSONE (DELTASONE) 10 MG tablet 4 tabs for 2 days, then 3 tabs for 2 days, 2 tabs for 2 days, then 1 tab for 2 days, then stop 20 tablet 0   No facility-administered medications prior to visit.     Review of Systems:   Constitutional:   No    night sweats,  Fevers, chills,  +fatigue, or  lassitude.  HEENT:   No headaches,  Difficulty swallowing,  Tooth/dental problems, or  Sore throat,                No sneezing, itching, ear ache, nasal congestion, post nasal drip,   CV:  No chest pain,  Orthopnea, PND, swelling in lower extremities, anasarca, dizziness, palpitations, syncope.   GI  No heartburn, indigestion, abdominal pain, nausea, vomiting, diarrhea, change in bowel habits, loss of appetite, bloody stools.  Resp:    No chest wall deformity  Skin: no rash or lesions.  GU: no dysuria, change in color of urine, no urgency or frequency.  No flank pain, no hematuria   MS:  No joint pain or swelling.  No decreased range of motion.  No back pain.    Physical Exam  BP 118/72 (BP Location: Left Arm, Cuff Size: Normal)   Pulse 84   Temp (!) 97 F (36.1 C)   Ht _0  (1.753 m)   Wt 121 lb (54.9 kg)   SpO2 94%   BMI 17.87 kg/m   GEN: A/Ox3; pleasant , NAD, thin cachectic appearing male   HEENT:  [place]/AT,  EACs-clear, TMs-wnl, NOSE-clear, THROAT-clear, no lesions, no postnasal drip or exudate noted.   NECK:  Supple w/ fair ROM; no JVD; normal carotid impulses w/o bruits; no thyromegaly or nodules palpated; no lymphadenopathy.    RESP few trace  rhonchi speaks in full sentences no accessory muscle use, no dullness to percussion  CARD:  RRR, no m/r/g, no peripheral edema, pulses intact, no cyanosis or clubbing.  GI:   Soft & nt; nml bowel sounds; no organomegaly or masses detected.   Musco: Warm bil, no deformities or joint swelling noted.   Neuro: alert, no focal deficits noted.    Skin: Warm, no lesions or rashes Tattoos   Lab Results:  BNP No results found for: BNP   Imaging: DG Chest 2 View  Result Date: 09/09/2020 CLINICAL DATA:  Cough EXAM: CHEST - 2 VIEW COMPARISON:  08/10/2020 FINDINGS: Severe bullous emphysema with bilateral upper lobe scarring. Mildly increased perihilar interstitial markings compared to prior. No pleural effusion. No evidence of pneumothorax. Stable heart size. IMPRESSION: Mildly increased perihilar interstitial markings compared to prior, could reflect atypical/viral infection or edema. Electronically Signed   By: Davina Poke D.O.   On: 09/09/2020 12:43    Benralizumab SOSY 30 mg    Date Action Dose Route User   08/03/2020 1430 Given  Subcutaneous (Right Arm) Elton Sin, LPN    Benralizumab SOSY 30 mg    Date Action Dose Route User   09/28/2020 1409 Given  Subcutaneous (Right Arm) Elton Sin, LPN      PFT Results Latest Ref Rng & Units 06/25/2017 08/06/2016  FVC-Pre L 2.52 2.84  FVC-Predicted Pre % 50 61  FVC-Post L - 2.93  FVC-Predicted Post % - 63  Pre FEV1/FVC % % 46 53  Post FEV1/FCV % % - 56  FEV1-Pre L 1.16 1.50  FEV1-Predicted Pre % 28 39  FEV1-Post L - 1.63  DLCO uncorrected ml/min/mmHg - 18.56  DLCO UNC% % - 59  DLCO corrected ml/min/mmHg - 18.35  DLCO COR %Predicted % - 59  DLVA Predicted % - 108  TLC L - 5.14  TLC % Predicted % - 76  RV % Predicted % - 129    No results found for: NITRICOXIDE      Assessment & Plan:   Asthmatic bronchitis , chronic (HCC) Slowly resolving asthmatic bronchitis.  Hold on additional antibiotics at this time.   Check sputum culture as he is prone to frequent flares.  And is immunosuppressed We will increase prednisone to 10 mg for 1 week.  Increase pulmonary hygiene.  Would like for him to restart his flutter valve 3 times a day. May use Robitussin liquid or Mucinex liquid as needed. Add albuterol nebulizer as needed  Plan  Patient Instructions  Continue on BREZTRI 2 puffs Twice daily  ,  rinse after use.  Increase Prednisone 63m daily for 1 week then Prednisone 553mdaily. Restart Flutter valve Three times a day  .  Please eat often add High protein food and drinks.  Continue on Fasenra.  Sputum culture today .  Continue with ID follow up.  Albuterol inhaler As needed  Wheezing/shortness of breath .  Add Albuterol neb every 6hrs as needed for wheezing /shortness of breath as needed.  Robitussin DM As needed  Cough/congestion .  Follow up with Dr. RaChase Callerr Millissa Deese NP in 6-8 weeks and As needed   Please contact office for sooner follow up if symptoms do not improve or worsen or seek emergency care        ABPA (allergic bronchopulmonary aspergillosis) (HCBerwynContinue on chronic steroids with prednisone 5 mg.  Will increase to 10 mg for 1 week and then resume 5 mg daily dosing. Continue on ICS combo  Mycobacterium avium complex (HCCollegedaleContinue on current MAC therapy and follow-up with ID  Invasive pulmonary aspergillosis (HCPlymouthContinue on antifungal and follow-up with ID  Protein-calorie malnutrition, severe Add an Ensure     TaParker HannifinNP 09/30/2020

## 2020-09-30 NOTE — Assessment & Plan Note (Signed)
Continue on chronic steroids with prednisone 5 mg.  Will increase to 10 mg for 1 week and then resume 5 mg daily dosing. Continue on ICS combo

## 2020-09-30 NOTE — Assessment & Plan Note (Signed)
Slowly resolving asthmatic bronchitis.  Hold on additional antibiotics at this time.  Check sputum culture as he is prone to frequent flares.  And is immunosuppressed We will increase prednisone to 10 mg for 1 week.  Increase pulmonary hygiene.  Would like for him to restart his flutter valve 3 times a day. May use Robitussin liquid or Mucinex liquid as needed. Add albuterol nebulizer as needed  Plan  Patient Instructions  Continue on BREZTRI 2 puffs Twice daily  , rinse after use.  Increase Prednisone 29m daily for 1 week then Prednisone 548mdaily. Restart Flutter valve Three times a day  .  Please eat often add High protein food and drinks.  Continue on Fasenra.  Sputum culture today .  Continue with ID follow up.  Albuterol inhaler As needed  Wheezing/shortness of breath .  Add Albuterol neb every 6hrs as needed for wheezing /shortness of breath as needed.  Robitussin DM As needed  Cough/congestion .  Follow up with Dr. RaChase Callerr Tamiki Kuba NP in 6-8 weeks and As needed   Please contact office for sooner follow up if symptoms do not improve or worsen or seek emergency care

## 2020-09-30 NOTE — Patient Instructions (Addendum)
Continue on BREZTRI 2 puffs Twice daily  , rinse after use.  Increase Prednisone 23m daily for 1 week then Prednisone 52mdaily. Restart Flutter valve Three times a day  .  Please eat often add High protein food and drinks.  Continue on Fasenra.  Sputum culture today .  Continue with ID follow up.  Albuterol inhaler As needed  Wheezing/shortness of breath .  Add Albuterol neb every 6hrs as needed for wheezing /shortness of breath as needed.  Robitussin DM As needed  Cough/congestion .  Follow up with Dr. RaChase Callerr Jaydyn Bozzo NP in 6-8 weeks and As needed   Please contact office for sooner follow up if symptoms do not improve or worsen or seek emergency care

## 2020-09-30 NOTE — Assessment & Plan Note (Deleted)
Continue on antifungal and follow-up with ID

## 2020-09-30 NOTE — Assessment & Plan Note (Signed)
Add an Ensure

## 2020-10-03 LAB — RESPIRATORY CULTURE OR RESPIRATORY AND SPUTUM CULTURE
MICRO NUMBER:: 11197188
RESULT:: NORMAL
SPECIMEN QUALITY:: ADEQUATE

## 2020-10-05 DIAGNOSIS — J4550 Severe persistent asthma, uncomplicated: Secondary | ICD-10-CM | POA: Diagnosis not present

## 2020-10-05 DIAGNOSIS — B449 Aspergillosis, unspecified: Secondary | ICD-10-CM | POA: Diagnosis not present

## 2020-10-11 ENCOUNTER — Telehealth: Payer: Self-pay

## 2020-10-11 NOTE — Telephone Encounter (Signed)
Patient called requesting earlier appointment with Dr. Tommy Medal due to coughing up more mucus than normal. Front desk to schedule him.   Beryle Flock, RN

## 2020-10-11 NOTE — Telephone Encounter (Signed)
Ok thx.

## 2020-10-12 ENCOUNTER — Ambulatory Visit
Admission: RE | Admit: 2020-10-12 | Discharge: 2020-10-12 | Disposition: A | Discharge status: Home / Self Care | Payer: BC Managed Care – PPO | Source: Ambulatory Visit | Attending: Infectious Disease | Admitting: Infectious Disease

## 2020-10-12 ENCOUNTER — Encounter: Payer: Self-pay | Admitting: Infectious Disease

## 2020-10-12 ENCOUNTER — Other Ambulatory Visit: Payer: Self-pay

## 2020-10-12 ENCOUNTER — Ambulatory Visit (INDEPENDENT_AMBULATORY_CARE_PROVIDER_SITE_OTHER): Payer: BC Managed Care – PPO | Admitting: Infectious Disease

## 2020-10-12 DIAGNOSIS — A310 Pulmonary mycobacterial infection: Secondary | ICD-10-CM

## 2020-10-12 DIAGNOSIS — B440 Invasive pulmonary aspergillosis: Secondary | ICD-10-CM

## 2020-10-12 DIAGNOSIS — R059 Cough, unspecified: Secondary | ICD-10-CM | POA: Diagnosis not present

## 2020-10-12 DIAGNOSIS — B449 Aspergillosis, unspecified: Secondary | ICD-10-CM

## 2020-10-12 DIAGNOSIS — J189 Pneumonia, unspecified organism: Secondary | ICD-10-CM

## 2020-10-12 DIAGNOSIS — B4481 Allergic bronchopulmonary aspergillosis: Secondary | ICD-10-CM

## 2020-10-12 MED ORDER — LEVOFLOXACIN 750 MG PO TABS: 750.0000 mg | ORAL_TABLET | Freq: Every day | ORAL | 1 refills | Status: DC

## 2020-10-12 NOTE — Patient Instructions (Addendum)
I want to collect  Sputum culture  AFB sputum culture  We wills give you levofloxacin 716m daily x `14 days   Will on the levofloxacin stop the azithromycin.  When you complete the levofloxacin resume the azithromycin

## 2020-10-12 NOTE — Progress Notes (Signed)
Chief complaint: Worsening cough with purulent sputum some with blood streaking  Subjective:    : Patient ID: [name], male    DOB: 09-01-1976, 44 y.o.   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 We had taken him off his Mycobacterium avium drugs and then he developed fevers chills and malaise  Spring (2013) and was seen by my partner Dr. Megan Salon in late April  His AFB smears and cultures obtained in the clinic on that day prior to the initiation of azithromycin ethambutol ultimately failed to grow Mycobacterium avium. He had been doing well I saw him in [place] 2013 we discontinued his azithromycin and ethambutol again. Unfortunately he again shortly thereafter developed fevers cough malaise. He started back on azithromycin ethambutol. Cultures done for AFB here were again negative. The patient however felt dramatically better having been back on azithromycin and ethambutol.   I saw him in January of 2015 when he was feeling relatively well.  He did have an episode of coughing up a few flecks of blood in January 2016 which prompted appt with [facility] but by time he made appt this had long since resolved. Was only a one time episode none since then.  I saw again in March and then in September 2016. In summer he was hospitalized with hemoptysis and found to have an area in the upper lobe consistent with an aspergilloma. He underwent bronchoscopy with BAL and cultures ultimately yielded a Saccharomyces species on fungal culture. His serum galactomannan was negative.  He had been  on voriconazole and axial has had radiographic improvement and his x-ray findings. He also feelt etter  symptomatically with no more hemoptysis improvement in his cough improvement in his energy though he still feels not nearly as well as he did prior to his hospitalization. He is avoiding exercised during the winter as it often precipitates coughing spells. He was  only taking 200 mg twice a day of voriconazole rather than the 300 mg he has continued this along with his ethambutol and azithromycin. He continued to have cough more so with exercise and has not been exercising at "[employer]" due to the fact this elicits more coughing. He has not smoked since 2007. He is seeing [name] and is being considered for new drug infusion to treat his COPD.  He did have 20# weight loss that he attributed to increased exercise.  He then saw  [name] after an episode of hematemesis. 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 He said he only coughed up a small amount of blood material and did not go to the ER but was seen and followed by pulmonary. Had a chest x-ray which showed no new findings.  He was then  going to be evaluated by the transplant team at [facility].  He did complain of hyperpigmented spots on sun exposed areas on his body including arms and neck which his dermatologist have told him is due to his voriconazole use.   We considered change at that time  Since that time he has [place] been admitted with hemoptysis at [facility].   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  There is interval development of fluid density in several bulla in the left upper lobe concerning for superimposed infection, possibly fungal in origin.  Stable bilateral calcified pulmonary nodules are noted consistent with prior granulomatous disease.  Stable tree in bud appearance is noted medially in the left lung posteriorly consistent with sequela of previous atypical infection.   Due to his skin toxicity we decided to  change him to [facility] and he is on that now .  And had done well.  He had problems with bilateral pneumothoraces.  This required placement of an intrabronchial valve in November 2019.  This resolved but then he had to be taken back to the operating room in early January where the intrabronchial valves were removed.  Was seen at [facility] medical transplant team as well but felt not yet to be a transplant candidate due to the fact that his lungs were not in severe enough condition to warrant transplantation  He had applied for disability but this has not yet gone through.  Certainly he deserves to have disability and he has significant medical problems.  He did however eventually go back to work at [employer]  He is been receiving his antimycobacterial drugs and is [place].  I last saw cans he was hospitalized in July for what he says was pneumonia.  The CT scan was read as showing:  " Chronic narrowing and occlusion of several right upper lobe segmental subsegmental pulmonary arteries and filling defects in the left upper lobe segmental and subsegmental branches which were within the regions of bullous disease which are suggestive of chronic embolism/occlusion.  It showed extensive bullous and paraseptal predominant emphysematous changes with thick-walled cystic bullae toward the apices with nodularity and air layering air-fluid levels within the left apical bulla consistent with aspergilloma.  He had mixed areas of groundglass and consolidative opacity left lower lobe and right lower lobe periphery.  Nares note indicates that he was not anticoagulated due to the fact that he has recurrent hemoptysis per the patient himself was unaware that he was found to have anything suggestive clots on scan and believes he was only treated for pneumonia.  Since I last saw Marcella he was treated with Augmentin and corticosteroids for flare of bronchitis.  He improved on both steroids and antibiotics  but within 3 to 5 days after coming off the Augmentin had worsening cough with productive sputum particularly in the morning some with some blood streaking.  He had seen pulmonary who had recommended him come see [place] again.  He has not had fevers chills or other systemic symptoms.  He is apprehensive that he could be harboring resistant organisms.  I think there is certainly possibility that his mycobacteria could have developed some resistance and/or that he has a superimposed bacterial infection yet again.     Past Medical History:  Diagnosis Date  . Anemia   . 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[place])   . Drug rash 12/16/2017  . Dyspnea   . Esophageal reflux    very rare  . 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 Lung disease, bullous ([place])   . MAI (mycobacterium avium-intracellulare) ([place])   . Mold exposure 12/05/2015  . 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 Solar lentigo 08/08/2015  . Weight loss 10/15/2016   Past Surgical History:  Procedure Laterality Date  . CHEST TUBE INSERTION Right 09/29/2018   Procedure: CHEST TUBE REPLACEMENT;  Surgeon: Melrose Nakayama, MD;  Location: [place];  Service: Thoracic;  Laterality:  Right;  Marland Kitchen INSERTION OF IBV VALVE N/A 09/19/2018   Procedure: INSERTION OF INTERBRONCHIAL VALVE (IBV);  Surgeon: Melrose Nakayama, MD;  Location[facility] OR;  Service: Thoracic;  Laterality: N/A;  . INSERTION OF IBV VALVE N/A 12/01/2018   Procedure: REMOVAL OF INTERBRONCHIAL VALVE (IBV);  Surgeon: Melrose Nakayama, MD;  Location[facility] OR;  Service: Thoracic;  Laterality: N/A;  . left VATS  2012   thoracotomy and LUL apical posterior segmentectomy  . STAPLING OF BLEBS Right 09/12/2018   Procedure: STAPLING OF BLEBS, right lower and middle lung lobes;  Surgeon: Melrose Nakayama, MD;  Location: [place];  Service: Thoracic;  Laterality: Right;  Marland Kitchen VIDEO ASSISTED THORACOSCOPY Right 09/12/2018   Procedure: VIDEO ASSISTED THORACOSCOPY, right lung;  Surgeon:  Melrose Nakayama, MD;  Location: [place];  Service: Thoracic;  Laterality: Right;  Marland Kitchen VIDEO BRONCHOSCOPY Bilateral 10/20/2015   Procedure: VIDEO BRONCHOSCOPY WITHOUT FLUORO;  Surgeon: Marshell Garfinkel, MD;  Location: [place];  Service: Cardiopulmonary;  Laterality: Bilateral;  . VIDEO BRONCHOSCOPY N/A 09/19/2018   Procedure: VIDEO BRONCHOSCOPY;  Surgeon: Melrose Nakayama, MD;  [facility] OR;  Service: Thoracic;  Laterality: N/A;  . VIDEO BRONCHOSCOPY N/A 12/01/2018   Procedure: VIDEO BRONCHOSCOPY;  Surgeon: Melrose Nakayama, MD;  Location: [place];  Service: Thoracic;  Laterality: N/A;  . VIDEO BRONCHOSCOPY WITH INSERTION OF INTERBRONCHIAL VALVE (IBV) N/A 09/29/2018   Procedure: VIDEO BRONCHOSCOPY WITH INSERTION OF INTERBRONCHIAL VALVE  (IBV);  Surgeon: Melrose Nakayama, MD;  Location: [facility] OR;  Service: Thoracic;  Laterality: N/A;   Family History  Adopted: Yes   Social History   Tobacco Use  . Smoking status: Former Smoker    Packs/day: 0.70    Years: 19.00    Pack years: 13.30    Types: Cigarettes    Quit date: 11/20/2007    Years since quitting: 12.9  . Smokeless tobacco: Former Systems developer    Types: Snuff    Quit date: 11/19/1994  Vaping Use  . Vaping Use: Never used  Substance Use Topics  . Alcohol use: No  . Drug use: No    Allergies  Allergen Reactions  . Clarithromycin Other (See Comments)    Adverse reaction w/ voriconazole UNSPECIFIED REACTION   . Rifaximin Other (See Comments)    Adverse reaction w/ voriconazole UNSPECIFIED REACTION     Current Outpatient Medications:  .  albuterol (PROVENTIL) (2.5 MG/3ML) 0.083% nebulizer solution, Take 3 mLs (2.5 mg total) by nebulization every 6 (six) hours as needed for wheezing or shortness of breath., Disp: 75 mL, Rfl: 12 .  albuterol (VENTOLIN HFA) 108 (90 Base) MCG/ACT inhaler, Inhale 2 puffs into the lungs every 6 (six) hours as needed., Disp: 18 g, Rfl: 2 .  amoxicillin-clavulanate (AUGMENTIN) 875-125 MG tablet,  Take 1 tablet by mouth 2 (two) times daily., Disp: 20 tablet, Rfl: 0 .  azithromycin (ZITHROMAX) 500 MG tablet, Take 1 tablet by mouth once daily, Disp: 30 tablet, Rfl: 5 .  Budeson-Glycopyrrol-Formoterol (BREZTRI AEROSPHERE) 160-9-4.8 MCG/ACT AERO, Inhale 2 puffs into the lungs 2 (two) times daily., Disp: 10.7 g, Rfl: 5 .  Calcium Carbonate-Vitamin D 600-400 MG-UNIT tablet, Take 1 tablet by mouth daily. , Disp: , Rfl:  .  chlorpheniramine-HYDROcodone (TUSSIONEX PENNKINETIC ER) 10-8 MG/5ML SUER, Take 5 mLs by mouth at bedtime as needed for cough., Disp: 140 mL, Rfl: 0 .  ethambutol (MYAMBUTOL) 400 MG tablet, TAKE 2 AND 1/2 TABLETS BY MOUTH ONCE DAILY, Disp: 598 tablet, Rfl: 5 .  feeding supplement, ENSURE ENLIVE, (ENSURE ENLIVE) LIQD, Take 237 mLs by mouth 3 (three) times daily between meals. (Patient taking differently: Take 237 mLs by mouth 2 (two) times daily between meals. ), Disp: 237 mL, Rfl: 12 .  guaiFENesin (MUCINEX) 600 MG 12 hr tablet, Take 1 tablet (600 mg total) by mouth 2 (two) times daily., Disp: 30 tablet, Rfl: 0 .  Isavuconazonium Sulfate (CRESEMBA) 186 MG CAPS, Take 2 capsules (372 mg total) by mouth daily., Disp: 56 capsule, Rfl: 5 .  levothyroxine (SYNTHROID) 88 MCG tablet, Take 100 mcg by mouth daily. , Disp: , Rfl:  .  predniSONE (DELTASONE) 5 MG tablet, Take 1 tablet (5 mg total) by mouth daily with breakfast., Disp: 90 tablet, Rfl: 1 .  EPINEPHrine 0.3 mg/0.3 mL IJ SOAJ injection, Inject 0.3 mLs (0.3 mg total) into the muscle once for 1 dose., Disp: 1 Device, Rfl: 11    Review of Systems  Constitutional: Positive for fatigue. Negative for activity change, appetite change, chills, diaphoresis, fever and unexpected weight change.  HENT: Negative for congestion, rhinorrhea, sinus pressure, sneezing, sore throat and trouble swallowing.   Eyes: Negative for photophobia and visual disturbance.  Respiratory: Positive for cough and shortness of breath. Negative for apnea, chest  tightness, wheezing and stridor.   Cardiovascular: Negative for chest pain, palpitations and leg swelling.  Gastrointestinal: Negative for abdominal pain, anal bleeding, blood in stool, constipation, diarrhea, nausea and vomiting.  Genitourinary: Negative for difficulty urinating, dysuria, flank pain and hematuria.  Musculoskeletal: Negative for arthralgias, back pain, gait problem, joint swelling and myalgias.  Skin: Negative for color change, pallor and wound.  Neurological: Negative for dizziness, tremors, weakness and light-headedness.  Hematological: Negative for adenopathy. Does not bruise/bleed easily.  Psychiatric/Behavioral: Negative for agitation, behavioral problems, confusion, decreased concentration, dysphoric mood and sleep disturbance.       Objective:   Physical Exam Constitutional:      General: He is not in acute distress.    Appearance: He is not diaphoretic.  HENT:     Head: Normocephalic and atraumatic.  Eyes:     General: No scleral icterus.    Conjunctiva/sclera: Conjunctivae normal.     Pupils: Pupils are equal, round, and reactive to light.  Cardiovascular:     Rate and Rhythm: Normal rate and regular rhythm.     Heart sounds: Normal heart sounds. No murmur heard.  No friction rub. No gallop.   Pulmonary:     Effort: Prolonged expiration present. No respiratory distress.     Breath sounds: Examination of the right-lower field reveals decreased breath sounds. Decreased breath sounds present. No wheezing.  Abdominal:     General: There is no distension.     Tenderness: There is no abdominal tenderness. There is no rebound.  Musculoskeletal:        General: No tenderness.     Cervical back: Normal range of motion and neck supple.  Lymphadenopathy:     Cervical: No cervical adenopathy.  Skin:    General: Skin is warm and dry.     Coloration: Skin is not pale.     Findings: No erythema.       Neurological:     Mental Status: He is alert and oriented to  person, place, and time.     Motor: No abnormal muscle tone.     Coordination: Coordination normal.  Psychiatric:        Mood and Affect: Mood normal.        Behavior: Behavior  normal.        Thought Content: Thought content normal.        Judgment: Judgment normal.          Assessment & Plan:   Possible kidney acquired pneumonia: I will start him on levofloxacin and have him hold his azithromycin while on this.  I am also sending sputum cultures for bacterial pathogens as well as an AFB culture  Aspergilloma: continue lifelong antifungal therapy: currently on CRESEMBA.  Allergic bronchopulmonary aspgergillosis: As above, I would be more comfortable with thim being on long standing antifungal therapy.   Sun exposed rash from voriconozole: We changed to Cresemba and it seems improved   Mycobacterium avium pulmonary infection:  I am concerned that IF he has developed long term resistance to an antimicrobial that M avium would be the more likely organism to have developed this  ?  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

## 2020-10-13 LAB — COMPLETE METABOLIC PANEL WITH GFR
AG Ratio: 1.3 (calc) (ref 1.0–2.5)
ALT: 19 U/L (ref 9–46)
AST: 27 U/L (ref 10–40)
Albumin: 3.9 g/dL (ref 3.6–5.1)
Alkaline phosphatase (APISO): 100 U/L (ref 36–130)
BUN: 13 mg/dL (ref 7–25)
CO2: 27 mmol/L (ref 20–32)
Calcium: 9.1 mg/dL (ref 8.6–10.3)
Chloride: 101 mmol/L (ref 98–110)
Creat: 0.86 mg/dL (ref 0.60–1.35)
GFR, Est African American: 121 mL/min/{1.73_m2} (ref >=60)
GFR, Est Non African American: 104 mL/min/{1.73_m2} (ref >=60)
Globulin: 3 g/dL (calc) (ref 1.9–3.7)
Glucose, Bld: 96 mg/dL (ref 65–99)
Potassium: 4.6 mmol/L (ref 3.5–5.3)
Sodium: 136 mmol/L (ref 135–146)
Total Bilirubin: 0.5 mg/dL (ref 0.2–1.2)
Total Protein: 6.9 g/dL (ref 6.1–8.1)

## 2020-10-13 LAB — CBC WITH DIFFERENTIAL/PLATELET
Absolute Monocytes: 286 cells/uL (ref 200–950)
Basophils Absolute: 11 cells/uL (ref 0–200)
Basophils Relative: 0.1 %
Eosinophils Absolute: 0 cells/uL — ABNORMAL LOW (ref 15–500)
Eosinophils Relative: 0 %
HCT: 41.1 % (ref 38.5–50.0)
Hemoglobin: 14.1 g/dL (ref 13.2–17.1)
Lymphs Abs: 498 cells/uL — ABNORMAL LOW (ref 850–3900)
MCH: 32.3 pg (ref 27.0–33.0)
MCHC: 34.3 g/dL (ref 32.0–36.0)
MCV: 94.3 fL (ref 80.0–100.0)
MPV: 8.7 fL (ref 7.5–12.5)
Monocytes Relative: 2.7 %
Neutro Abs: 9805 cells/uL — ABNORMAL HIGH (ref 1500–7800)
Neutrophils Relative %: 92.5 %
Platelets: 289 10*3/uL (ref 140–400)
RBC: 4.36 10*6/uL (ref 4.20–5.80)
RDW: 13.2 % (ref 11.0–15.0)
Total Lymphocyte: 4.7 %
WBC: 10.6 10*3/uL (ref 3.8–10.8)

## 2020-10-15 LAB — RESPIRATORY CULTURE OR RESPIRATORY AND SPUTUM CULTURE
MICRO NUMBER:: 11248166
RESULT:: NORMAL
SPECIMEN QUALITY:: ADEQUATE

## 2020-10-20 ENCOUNTER — Ambulatory Visit: Payer: BC Managed Care – PPO | Admitting: Adult Health

## 2020-11-02 ENCOUNTER — Other Ambulatory Visit: Payer: Self-pay

## 2020-11-02 ENCOUNTER — Ambulatory Visit (INDEPENDENT_AMBULATORY_CARE_PROVIDER_SITE_OTHER): Payer: BC Managed Care – PPO | Admitting: Internal Medicine

## 2020-11-02 ENCOUNTER — Encounter: Payer: Self-pay | Admitting: Internal Medicine

## 2020-11-02 VITALS — BP 88/50 | HR 88 | Temp 97.0°F | Ht 68.0 in | Wt 116.0 lb

## 2020-11-02 DIAGNOSIS — J8283 Eosinophilic asthma: Secondary | ICD-10-CM | POA: Diagnosis not present

## 2020-11-02 DIAGNOSIS — Z7185 Encounter for immunization safety counseling: Secondary | ICD-10-CM

## 2020-11-02 DIAGNOSIS — Z79899 Other long term (current) drug therapy: Secondary | ICD-10-CM

## 2020-11-02 DIAGNOSIS — B440 Invasive pulmonary aspergillosis: Secondary | ICD-10-CM

## 2020-11-02 DIAGNOSIS — J4550 Severe persistent asthma, uncomplicated: Secondary | ICD-10-CM

## 2020-11-02 DIAGNOSIS — R768 Other specified abnormal immunological findings in serum: Secondary | ICD-10-CM | POA: Diagnosis not present

## 2020-11-02 DIAGNOSIS — B449 Aspergillosis, unspecified: Secondary | ICD-10-CM

## 2020-11-02 DIAGNOSIS — A310 Pulmonary mycobacterial infection: Secondary | ICD-10-CM | POA: Diagnosis not present

## 2020-11-02 DIAGNOSIS — D84821 Immunodeficiency due to drugs: Secondary | ICD-10-CM

## 2020-11-02 LAB — SEDIMENTATION RATE: Sed Rate: 61 mm/hr — ABNORMAL HIGH (ref 0–15)

## 2020-11-02 LAB — CBC WITH DIFFERENTIAL/PLATELET
Basophils Absolute: 0 10*3/uL (ref 0.0–0.1)
Basophils Relative: 0 % (ref 0.0–3.0)
Eosinophils Absolute: 0 10*3/uL (ref 0.0–0.7)
Eosinophils Relative: 0 % (ref 0.0–5.0)
HCT: 37.5 % — ABNORMAL LOW (ref 39.0–52.0)
Hemoglobin: 12.7 g/dL — ABNORMAL LOW (ref 13.0–17.0)
Lymphocytes Relative: 7 % — ABNORMAL LOW (ref 12.0–46.0)
Lymphs Abs: 0.6 10*3/uL — ABNORMAL LOW (ref 0.7–4.0)
MCHC: 33.9 g/dL (ref 30.0–36.0)
MCV: 96.3 fl (ref 78.0–100.0)
Monocytes Absolute: 0.5 10*3/uL (ref 0.1–1.0)
Monocytes Relative: 5.9 % (ref 3.0–12.0)
Neutro Abs: 7.3 10*3/uL (ref 1.4–7.7)
Neutrophils Relative %: 87.1 % — ABNORMAL HIGH (ref 43.0–77.0)
Platelets: 316 10*3/uL (ref 150.0–400.0)
RBC: 3.9 Mil/uL — ABNORMAL LOW (ref 4.22–5.81)
RDW: 15.5 % (ref 11.5–15.5)
WBC: 8.3 10*3/uL (ref 4.0–10.5)

## 2020-11-02 LAB — SARS-COV-2 IGG: SARS-COV-2 IgG: 9.4

## 2020-11-02 NOTE — Progress Notes (Signed)
male never smoker followed for ABPA , MAC and Aspergilloma .  Patient has a history of aspergilloma , status post VATS in 2012. 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 Followed at [facility] for MAC .    TEST  1.7/13 Ig E 800s 12/10/11 Sputum AFB  Smear and culture negative (prelim) 12/10/11 Fungal sputum culture - negative (final) 01/30/2012 spirometry - fev1 1.7L/41%, ratip 52 - BEST EVER 01/30/2012 walking desaturation test: 185 feet x 3 laps: did not deaturate 05/2014 >Spiro fev1 1.7L/40%, ratio 55  Spirometry 06/25/2017 showed FEV1 at 28%, ratio 46, FVC 50%.  Events  2010 Dx with MAI >Azithro/Ethambutol 2012 Dx with Aspergilloma >VATS >started on Voriconazaol  2012 Dx w/ ABPA w/ High IgE >4000>started on prednisone  2013 tried off azithr/Etham but developed fever, Cx neg but restarted on rx.  2016 Hospitalzed with hemoptysis >IR guided embolization . FOB/BAL + Saccharomyces on fungal fx . Serum Galactomannan was neg.  [name] evaluation 04/2017 for Transplant -rejected. Felt to early for transplant - recommended pre-transplant weight goal 128 lbs for a BMI > 19          HPI   OV 01/16/2016  Chief Complaint  Patient presents with  . Follow-up    Pt denies any increased dyspnea at this time - states that it comes and goes with activity. Pt states it is different on a daily basis. Denies hemoptysis since last OV.   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 Since then he overall feels that his dyspnea is not back to baseline. He feels he has had a new lower baseline. He has more fatigued than usual. But he is stable without any exacerbation. He feels in the next few years ago started to become permanently disabled. He currently works as a cBiomedical scientist There are no fevers.   Last chest x-ray 11/07/2015: Shows hyperinflation personally  visualized Last IgE January 2013 was 841 and significantly improved from 4000 2012.  Last CBC January 2017 with normal used to flows are 100 cells per cubic millimeter.   OV 08/27/2016  Chief Complaint  Patient presents with  . Follow-up    Pt states his cough and SOB is at baseline. Pt states he has hemoptysis around once a week at most - penny size of mucus. Pt denies CP/tightness and f/c/s.      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 He status post IR guided embolization of hemoptysis end 2016.   Last visit feb 2017 he was reporting worsening dyspnea. We checked his IgE. Improved significantly but it was still in the 700s. We started Xolair therapy. He's been doing this now for a few months. He says this has helped his dyspnea symptoms significantly. However at end of 2 weeks he starts getting more symptomatic. Overall he is stable. 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 There are no fevers. Here, function test today FEV1 is 1.65 L/42% postbronchodilator. This is similar to 2014 but improved compared to 2015. Off note he tells me that he is mild hemoptysis once a month this is stable. He says this is actually been going on since November 2016 when he had his embolization. This no change in this. He knows that he has to monitor his hemoptysis volume.  He will have his flu shot today  Last chest x-ray 11/07/2015: Shows hyperinflation personally visualized Last IgE FEb 2017 was 717 and started xolair (January 2013 was 841 and significantly improved from 4000 in  2012)   OV 03/18/2017  Chief Complaint  Patient presents with  . Follow-up    6 mo f/u. Breathing has been the same since the last visit. Denies any increased SOB or chest pains.    Follow-up severe persistent asthma with ABPA and aspergilloma with MAI infection. He status post IR guided embolization for hemoptysis and end of  2016   Overall he has done well. However he says that at his job at [employer] they have been working hard and he is mostly fatigue with very closely. Recently been only sleeping 3 hours per day because of the work. Then in mid April 2018 he have one episode of hemoptysis while at work. With small amount that lasted few to several minutes. It did scare him but he decided to hold off against going to the emergency department. Since then he's not had any recurrence. He feels stressed and job stress is contributing to this. Most recent CT scan chest as in December 2017 that shows stability documented below. There are no other new issues. He is open to having a transplant referral.   OV 01/27/2018  Chief Complaint  Patient presents with  . Follow-up    Pt states he has been doing good. Denies any complaints or concerns.     Follow-up severe persistent asthma with ABPA on prednisone 25m daily, spiriva, breo and Aspergilloma with MAI infection. He status post IR guided embolization of hemoptysis end 2016 and on Xolair since early 2017   Overall doing well. 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 Symptoms score is low ; 12 CAT score. Compliant with Rx. No hemoptysis. Reviewed prior labs - he says on daily prednisone since 2012. And prior to that has had significant eosinophilia and also break through eos since then (see below). Unable to come off prednisone completely. XKathreen Devoidhas helped but not fully. 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 , MRN: [id number], ADDRESS: [address] [place] [address]  12/17/2018 -   Chief Complaint  Patient presents with  . Follow-up    Pt states he was in the hosp Oct and Nov 2019. Pt states he is still not back to his self but is taking  one day at a time. Due to the recent hospitalization is why pt is here for today's appt.     Follow-up severe persistent asthma with ABPA on prednisone 518mdaily, spiriva, breo and Aspergilloma with MAI infection. He status post IR guided embolization of hemoptysis end 2016 and on Xolair since early 2017 bu tstopped since spring 2019 due to insurance issues    HPI Brett TENDLER89.o. -returns for follow-up.  I last saw him in the spring 2019.  After that in September 2019 he saw my nurse practitioner.  Then mid October 2019 he was admitted with acute right-sided tension pneumothorax [personally visualized the CT chest and confirmed the findings today].  After that he had a 5-week hospital stay.  This included failed chest tube management and pleurodesis.  Finally he required  endobronchial valves by Dr. Roxan Hockey.  These were also finally removed a few weeks ago.  He saw Dr. Roxan Hockey yesterday.  Chest x-ray follow-up yesterday that I visualized the lungs look expanded.  He is also seen [name] of infectious diseases yesterday and is been asked to continue his MAC and ABPA antifungal therapy.  I did notice that he is not taking Xolair anymore since the spring 2019.  He says is due to insurance issues.  Today he tells me that overall with all these admissions he has been left completely fatigued, lost weight and worn out.  He is now worried about his life expectancy.  At the same time he wants to go on work.  He wants to know if he should apply for disability.  We have had these conversations in the past.  We discussed his life philosophy.  He tells me that if he did not work he would feel that he is lazy and it is not compatible with this personal value system.  I did indicate to him that this is to be completely respected but his medical condition is that he would qualify for disability.  Therefore it is a personal choice he will have to make.  He is applied for Social Security disability through  the hospital social worker in November 2019.  The decision is pending any time now.  He is frustrated that is not able to work 80 hours a day.  He is wondering if he can even work 40 hours a day.  I have only advised him that he should look at complete disability.  If at all he wants to work he can look at a desk job.  Definitely should avoid jobs that heavy workload in the kitchen that involve carrying things or even sneezing or even doing pulmonary function test anymore because given his propensity for pneumothorax any rise in intrathoracic pressure would put him at risk for pneumothorax or other complications.  Other than this in terms of his lung health he is feeling stable at this point.  He is not taking Xolair he stopped this in the spring 2019.  This means is continued IgE destruction of the lungs.  We discussed this and he is willing to look into restarting this  Of note, he also wants me to check his blood work for [name].  I reviewed Dr. Arlyss Queen note.  This is indeed correct because yesterday pick medical record system was down.    OV 10/28/2019  Subjective:  Patient ID: [name], male , DOB: Oct 09, 1976 , age 20 y.o. , MRN: [id number] , ADDRESS: [address] [place] [address]   10/28/2019 -   Chief Complaint  Patient presents with  . Follow-up    Pt states he has been doing okay since last visit. States he has good and bad days with his breathing and also has an occ cough.      Follow-up severe persistent asthma with ABPA on prednisone 59m daily, spiriva, breo and Aspergilloma with MAI infection. He status post IR guided embolization of hemoptysis end 2016 and on Xolair since early 2017 bu tstopped since spring 2019 due to insurance issues. FASENRA since April/May 2020  HPI KKELTEN ENOCHS89y.o. -routine follow-up last seen in January 2020.  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  He will not lift more than 15 pounds because of his pneumothorax risk and dyspnea.  He is happy now that he has employed based insurance.  He did apply for disability but did not qualify because he did not desaturate with a 6-minute walk test.  This is despite him having severe obstructive lung disease.  He says he is reconciled and he likes to work because it gives him purpose.  He has had his flu shot.  He is now taking [name] since spring 2020 and this seems to be working well for him.  He takes it every 8 weeks.  He has not had any exacerbations.  He prefers to avoid pulmonary function test because of the pneumothorax risk.  There are no other new issues.     CAT Symptom & Quality of Life Score (GSK trademark) 0 is no burden. 5 is highest burden 01/27/2018   Never Cough -> Cough all the time 3  No phlegm in chest -> Chest is full of phlegm 2  No chest tightness -> Chest feels very tight 1  No dyspnea for 1 flight stairs/hill -> Very dyspneic for 1 flight of stairs 3  No limitations for ADL at home -> Very limited with ADL at home 1  Confident leaving home -> Not at all confident leaving home 0  Sleep soundly -> Do not sleep soundly because of lung condition 1  Lots of Energy -> No energy at all 1  TOTAL Score (max 40)  12    Results for LANIS, STORLIE (MRN [id number]) as of 01/27/2018 09:12  Ref. Range 08/06/2016 09:09 06/25/2017 09:11  FEV1-Pre Latest Units: L 1.50 1.16  FEV1-%Pred-Pre Latest Units: % 39 28  Pre FEV1/FVC ratio Latest Units: % 53 46     Results for WILLIM, TURNAGE (MRN [id number]) as of 01/27/2018 09:12  Ref. Range 12/16/2010 18:25 12/17/2010 03:45 12/17/2010 12:38 12/18/2010 04:25 12/19/2010 04:00 12/20/2010 04:04 12/21/2010 03:37 12/22/2010 06:42 12/22/2010 15:22 12/23/2010 04:22 12/24/2010 03:30 12/25/2010 04:19 12/27/2010 04:00 03/05/2011 18:15 03/06/2011 09:08 03/07/2011 09:18 03/09/2011 05:10 03/11/2011  04:54 04/02/2011 09:41 05/28/2011 09:42 11/26/2011 17:14 12/10/2011 10:21 12/10/2011 10:21 03/13/2012 16:54 09/22/2012 10:46 11/23/2013 09:46 07/05/2014 09:58 02/16/2015 10:16 08/08/2015 10:04 10/13/2015 04:00 10/13/2015 04:20 10/14/2015 02:28 10/15/2015 04:44 12/05/2015 12:16 01/16/2016 16:16 04/09/2016 09:43 08/09/2017 10:48 08/10/2017 10:46  Eosinophils Absolute Latest Ref Range: 0.0 - 0.7 K/uL 0.3 0.1  0.4          0.0 0.0 0.1   0.1 0.0 0.0    0.1 0.1 0.1 0.1 0.0 0.1   0.1 0.1 0.0 196 0.0 0.0   Results for NICKOLI, BAGHERI (MRN [id number]) as of 01/27/2018 09:12  Ref. Range 03/07/2011 09:18 03/08/2011 16:23 11/26/2011 17:14 01/16/2016 16:16 10/28/2019   IgE (Immunoglobulin E), Serum Latest Ref Range: <115 kU/L 4185.2 Result con... (H) 4744.6.Marland Kitchen. (H) 841.8 (H) 717 (H) 537      OV 06/09/2020  Subjective:  Patient ID: [name], male , DOB: 1976-06-21 , age 15 y.o. , MRN: [id number] , ADDRESS: [address] [place] [address]   06/09/2020 -   Chief Complaint  Patient presents with  . Hospitalization Follow-up    recent PNA- feeling weak today and low energy. He is not coughing more than usual baseline at this time- last hemoptysis 06/07/20.      HPI JASAUN CARN 44 y.o. -returns for follow-up.  He was admitted June 04, 2020 with hemoptysis over several weeks.  This is CT diagnosis of left lower lobe pneumonia according to the discharge notes.  He was discharged 1 day prior to this visit on June 15, 2020.  The CT report as below.  Currently he feels free of hemoptysis but just feels fatigued.  He still continues to work.  He is not interested in disability.  He is taking Spiriva and Breo.  He is taken a few days of work because of the post admission fatigue.  Of note his CT chest shows evidence of right heart strain.  He did not have an echocardiogram yet   IMPRESSION: 1. 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 Additional filling defects  present in the left upper lobe segmental and subsegmental branches as well. These are both within regions of extensive bullous disease, architectural distortion and surgical change and the peripherally marginated appearance is most suggestive of chronic embolus/occlusion. 2. While the RV/LV ratio is increased (1.15) the intraventricular septum remains appropriately convex and there is associated right atrial enlargement with reflux of contrast which could suggest more longstanding right heart enlargement and chronic pulmonary artery hypertension rather than acute right heart strain though if there is clinical concern, echocardiography could be performed for more definitive assessment. 3. Extensive chronic bullous paraseptal predominant emphysematous changes. Scattered areas of thick walled cystic bulla towards the apices with some peripheral nodularity and layering air-fluid level within the left apical bulla which could reflect a persisting fungal or atypical superinfection. Certainly could present with hemoptysis. 4. Further mixed areas of ground-glass and consolidative opacity in the left lower and right lower lobe periphery, also likely infectious or inflammatory with the differential including potential atypical viral infection such as COVID-19. 5. Cholelithiasis. 6. Moderate bilateral gynecomastia. 7. Paucity of subcutaneous fat.  Correlate with nutritional status. 8. Aortic Atherosclerosis (ICD10-I70.0). 9. Emphysema (ICD10-J43.9).  These results were called by telephone at the time of interpretation on 06/04/2020 at 10:50 pm to provider DAVID YAO , who verbally acknowledged these results.   Electronically Signed   By: Lovena Le M.D.   On: 06/04/2020 22:50  ROS - per HPI     OV 11/02/2020  Subjective:  Patient ID: [name], male , DOB: 06/15/1976 , age 33 y.o. , MRN: [id number] , ADDRESS: [place] [id number] PCP Robert Bellow, PA-C Patient Care Team: Rip Harbour as PCP - General (Internal Medicine) Tommy Medal, Lavell Islam, MD as Consulting Physician (Infectious Diseases) System, Provider Not In as Consulting Physician Brand Males, MD as Consulting Physician (Pulmonary Disease)  This Provider for this visit: Treatment Team:  Attending Provider: Brand Males, MD     Follow-up - - severe persistent asthma with ABPA and highly elevated IgE levels [4000 and 2012] on prednisone 60m daily, BREZTRI  - >  he  and on Xolair since early 2017 bu tstopped since spring 2019 due to insurance issues. FASENRA since April/May 2020 -  Aspergilloma   - Rx voriconazole -> developed skin lesions and darkness in the forearms ->Rx Cresemba via Dr VTommy Medal  -  status post IR guided embolization of hemoptysis end 2016  -  MAI infection. 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 Follow-up  Patient had bronchitis in October and has not felt good since then, BP low today. More tired than normal, shortness of breath is worse. Productive cough with yellow/green sputum since October     HPI [name] 44 y.o. -returns for follow-up.. Last seen in July 2021. After that he is seen nurse practitioner because of exacerbation symptoms. The check sputum culture couple of times I reviewed the records but this is all no growth. He says that he now has increased sputum production than baseline. It is yellow-green in color. He is frustrated that the cultures have not grown anything. In addition is reporting significant fatigue. His last blood IgE was in 2020 when it improved from 4000 several years ago to 500. He had recent chest x-ray in the last couple of months both of them was stable with chronic changes. He feels  very miserable because of his fatigue and respiratory symptoms. He says not able to sleep well in the night. He says the quality of life is miserable  He is up-to-date with his vaccination     ROS - per HPI     has a past medical history of Anemia, 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[place]), Drug rash (12/16/2017), Dyspnea, Esophageal reflux, Hypothyroidism, Lung disease, bullous ([place]), MAI (mycobacterium avium-intracellulare) ([place]), Mold exposure (12/05/2015), Photosensitivity dermatitis (09/16/2017), Solar lentigo (08/08/2015), and Weight loss (10/15/2016).   reports that he quit smoking about 12 years ago. His smoking use included cigarettes. He has a 13.30 pack-year smoking history. He quit smokeless tobacco use about 25 years ago.  His smokeless tobacco use included snuff.  Past Surgical History:  Procedure Laterality Date  . CHEST TUBE INSERTION Right 09/29/2018   Procedure: CHEST TUBE REPLACEMENT;  Surgeon: Melrose Nakayama, MD;  Location: [facility];  Service: Thoracic;  Laterality: Right;  . INSERTION OF IBV VALVE N/A 09/19/2018   Procedure: INSERTION OF INTERBRONCHIAL VALVE (IBV);  Surgeon: Melrose Nakayama, MD;  Location: [facility] OR;  Service: Thoracic;  Laterality: N/A;  . INSERTION OF IBV VALVE N/A 12/01/2018   Procedure: REMOVAL OF INTERBRONCHIAL VALVE (IBV);  Surgeon: Melrose Nakayama, MD;  Location[facility] OR;  Service: Thoracic;  Laterality: N/A;  . left VATS  2012   thoracotomy and LUL apical posterior segmentectomy  . STAPLING OF BLEBS Right 09/12/2018   Procedure: STAPLING OF BLEBS, right lower and middle lung lobes;  Surgeon: Melrose Nakayama, MD;  Location: [facility];  Service: Thoracic;  Laterality: Right;  Marland Kitchen VIDEO ASSISTED THORACOSCOPY Right 09/12/2018   Procedure: VIDEO ASSISTED THORACOSCOPY, right lung;  Surgeon: Melrose Nakayama, MD;  Location: [facility];  Service: Thoracic;  Laterality: Right;  Marland Kitchen VIDEO BRONCHOSCOPY Bilateral 10/20/2015   Procedure: VIDEO BRONCHOSCOPY WITHOUT FLUORO;   Surgeon: Marshell Garfinkel, MD;  Location: [facility];  Service: Cardiopulmonary;  Laterality: Bilateral;  . VIDEO BRONCHOSCOPY N/A 09/19/2018   Procedure: VIDEO BRONCHOSCOPY;  Surgeon: Melrose Nakayama, MD;  Location: [facility] OR;  Service: Thoracic;  Laterality: N/A;  . VIDEO BRONCHOSCOPY N/A 12/01/2018   Procedure: VIDEO BRONCHOSCOPY;  Surgeon: Melrose Nakayama, MD;  Location: [facility];  Service: Thoracic;  Laterality: N/A;  . VIDEO BRONCHOSCOPY WITH INSERTION OF INTERBRONCHIAL VALVE (IBV) N/A 09/29/2018   Procedure: VIDEO BRONCHOSCOPY WITH INSERTION OF INTERBRONCHIAL VALVE  (IBV);  Surgeon: Melrose Nakayama, MD;  Location: [facility] OR;  Service: Thoracic;  Laterality: N/A;    Allergies  Allergen Reactions  . Clarithromycin Other (See Comments)    Adverse reaction w/ voriconazole UNSPECIFIED REACTION   . Rifaximin  Other (See Comments)    Adverse reaction w/ voriconazole UNSPECIFIED REACTION     Immunization History  Administered Date(s) Administered  . DTaP 11/19/2010  . Influenza Split 10/29/2011, 08/14/2012  . Influenza Whole 09/19/2010  . Influenza,inj,Quad PF,6+ Mos 11/23/2013, 09/28/2014, 09/06/2015, 08/27/2016, 08/10/2017, 09/09/2018, 08/06/2019, 08/10/2020  . PFIZER SARS-COV-2 Vaccination 02/18/2020, 03/14/2020, 08/10/2020  . Pneumococcal Conjugate-13 04/29/2017, 04/29/2017  . Pneumococcal Polysaccharide-23 12/17/2010    Family History  Adopted: Yes     Current Outpatient Medications:  .  albuterol (PROVENTIL) (2.5 MG/3ML) 0.083% nebulizer solution, Take 3 mLs (2.5 mg total) by nebulization every 6 (six) hours as needed for wheezing or shortness of breath., Disp: 75 mL, Rfl: 12 .  albuterol (VENTOLIN HFA) 108 (90 Base) MCG/ACT inhaler, Inhale 2 puffs into the lungs every 6 (six) hours as needed., Disp: 18 g, Rfl: 2 .  azithromycin (ZITHROMAX) 500 MG tablet, Take 1 tablet by mouth once daily, Disp: 30 tablet, Rfl: 5 .  Budeson-Glycopyrrol-Formoterol (BREZTRI AEROSPHERE)  160-9-4.8 MCG/ACT AERO, Inhale 2 puffs into the lungs 2 (two) times daily., Disp: 10.7 g, Rfl: 5 .  Calcium Carbonate-Vitamin D 600-400 MG-UNIT tablet, Take 1 tablet by mouth daily. , Disp: , Rfl:  .  chlorpheniramine-HYDROcodone (TUSSIONEX PENNKINETIC ER) 10-8 MG/5ML SUER, Take 5 mLs by mouth at bedtime as needed for cough., Disp: 140 mL, Rfl: 0 .  EPINEPHrine 0.3 mg/0.3 mL IJ SOAJ injection, Inject 0.3 mLs (0.3 mg total) into the muscle once for 1 dose., Disp: 1 Device, Rfl: 11 .  ethambutol (MYAMBUTOL) 400 MG tablet, TAKE 2 AND 1/2 TABLETS BY MOUTH ONCE DAILY, Disp: 598 tablet, Rfl: 5 .  feeding supplement, ENSURE ENLIVE, (ENSURE ENLIVE) LIQD, Take 237 mLs by mouth 3 (three) times daily between meals. (Patient taking differently: Take 237 mLs by mouth 2 (two) times daily between meals.), Disp: 237 mL, Rfl: 12 .  guaiFENesin (MUCINEX) 600 MG 12 hr tablet, Take 1 tablet (600 mg total) by mouth 2 (two) times daily., Disp: 30 tablet, Rfl: 0 .  Isavuconazonium Sulfate (CRESEMBA) 186 MG CAPS, Take 2 capsules (372 mg total) by mouth daily., Disp: 56 capsule, Rfl: 5 .  levofloxacin (LEVAQUIN) 750 MG tablet, Take 1 tablet (750 mg total) by mouth daily., Disp: 14 tablet, Rfl: 1 .  levothyroxine (SYNTHROID) 88 MCG tablet, Take 100 mcg by mouth daily. , Disp: , Rfl:  .  predniSONE (DELTASONE) 5 MG tablet, Take 1 tablet (5 mg total) by mouth daily with breakfast., Disp: 90 tablet, Rfl: 1      Objective:   Vitals:   11/02/20 1126  BP: (!) 88/50  Pulse: 88  Temp: (!) 97 F (36.1 C)  TempSrc: Temporal  SpO2: 94%  Weight: 116 lb (52.6 kg)  Height: _0  (1.727 m)    Estimated body mass index is 17.64 kg/m as calculated from the following:   Height as of this encounter: _1  (1.727 m).   Weight as of this encounter: 116 lb (52.6 kg).  _2 @  Filed Weights   11/02/20 1126  Weight: 116 lb (52.6 kg)     Physical Exam  General: No distress. Cachexia +. Looks a bit more tired than  usual Neuro: Alert and Oriented x 3. GCS 15. Speech normal Psych: Pleasant Resp:  Barrel Chest - no.  Wheeze - no, Crackles - no, No overt respiratory distress CVS: Normal heart sounds. Murmurs - no Ext: Stigmata of Connective Tissue Disease - no HEENT: Normal upper airway. PEERL +. No post nasal  drip        Assessment:       ICD-10-CM   1. Severe persistent asthma without complication  Y19.50   2. 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[place])  B44.9   3. 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 Immunosuppression due to drug therapy ([place])  D84.821    Z79.899   8. Vaccine counseling  Z71.85    Overall very concerned about his worsening respiratory symptoms. His symptom burden is significantly high. 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 It is somewhat reminiscent of when I first met him nearly 9 years ago when he was running high fevers and had very significant systematic inflammatory response at which time he required antifungal therapy MAI therapy and Xolair therapy to start feeling better. It is possible that the [facility] therapy is ineffective. Other differential diagnosis includes MAI developing resistance.  I have counseled him about the need for testing which is documented below. After that we will regroup. Based on the results we might have to entertain switching him back to Xolair instead of Fasenra  Of note he is immunosuppressed. Despite having had the Covid booster vaccine he might not have mounted any antibodies. He likely is eligible for the newly approved monoclonal antibody of AstraZen[employer] for preexposure prophylaxis. If he fails to get this we could look at the Regeneron trial for patients of had the vaccine but have not mounted antibody response. We are checking a Covid IgG    Plan:     Patient Instructions     ICD-10-CM   1. Severe  persistent asthma without complication  T24.58   2. 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[place])  B44.9   3. 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 Immunosuppression due to drug therapy ([place])  D84.821    Z79.899   8. Vaccine counseling  Z71.85      Unclear why you are having increased fatigue and respiratory symptoms and also increase sputum production that is yellow-green in color  Recent sputum cultures to be normal  Wondering if if you are developing resistance to the MAI infection or the airway problems asthma is actually active [name] is inadequate for you and you actually need Xolair)   Plan -Check CBC with differential, blood IgE, blood ESR -Check sputum for Gram stain and culture and AFB smear and culture -Check QuantiFERON gold -Check blood Covid IgG to see if you mounted antibodies to the Covid vaccine  - if negative would qualify for MAB prophylaxis or clinical trial - do spirometry  Pre and post BD and DLCO -Continue currently prescribed inhaler therapy  BREZTRI -Continue for now Fasenra injections as per current schedule [we are reassessing the need for this and considering switch to [place] -Continue daily prednisone Continue MAI treatment and aspergillus treatment per infectious diseases protocol of [name]  Follow-up  -2-4 weeks with NP Aaron Edelman or Tammy to discuss results  And next step - feb 2022/marhc 2022 - 30 min slot with Dr Chase Caller   ( Level 05 visit: Estb 40-54 min   in  visit type: on-site physical face to visit  in total care time and counseling or/and coordination of care  by this undersigned MD - Dr Brand Males. This includes one or more of the following on this same day 11/02/2020: pre-charting, chart review, note writing, documentation discussion of test results, diagnostic or treatment recommendations, prognosis, risks and benefits of management  options, instructions, education, compliance or risk-factor reduction. It excludes time spent by the [place] or office staff in the care of the patient. Actual time 61 min)   SIGNATURE    Dr. Brand Males, M.D., F.C.C.P,  Pulmonary and Critical Care Medicine Staff Physician, [place] Director - Interstitial Lung Disease  Program  Pulmonary [place] [facility] [place], [place], [address]  Pager: [phone], If no answer or between  15:00h - 7:00h: call [phone] Telephone: [phone]  12:04 PM 11/02/2020

## 2020-11-02 NOTE — Patient Instructions (Addendum)
ICD-10-CM   1. Severe persistent asthma without complication  J45.50   2. 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HCC)  B44.9   3. Elevated IgE level  R76.8   4. 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 Immunosuppression due to drug therapy (HCC)  D84.821    Z79.899   8. Vaccine counseling  Z71.85      Unclear why you are having increased fatigue and respiratory symptoms and also increase sputum production that is yellow-green in color  Recent sputum cultures to be normal  Wondering if if you are developing resistance to the MAI infection or the airway problems asthma is actually active ( Fasenra is inadequate for you and you actually need Xolair)   Plan -Check CBC with differential, blood IgE, blood ESR -Check sputum for Gram stain and culture and AFB smear and culture -Check QuantiFERON gold -Check blood Covid IgG to see if you mounted antibodies to the Covid vaccine  - if negative would qualify for MAB prophylaxis or clinical trial - do spirometry  Pre and post BD and DLCO -Continue currently prescribed inhaler therapy  BREZTRI -Continue for now Fasenra injections as per current schedule [we are reassessing the need for this and considering switch to Xolair] -Continue daily prednisone Continue MAI treatment and aspergillus treatment per infectious diseases protocol of Dr. Vandam  Follow-up  -2-4 weeks with NP Brian or Tammy to discuss results  And next step - feb 2022/marhc 2022 - 30 min slot with Dr  

## 2020-11-04 ENCOUNTER — Telehealth: Payer: Self-pay | Admitting: Internal Medicine

## 2020-11-04 DIAGNOSIS — D649 Anemia, unspecified: Secondary | ICD-10-CM | POA: Diagnosis not present

## 2020-11-04 LAB — QUANTIFERON-TB GOLD PLUS
Mitogen-NIL: 2.68 IU/mL
NIL: 0.03 IU/mL
QuantiFERON-TB Gold Plus: NEGATIVE
TB1-NIL: 0 IU/mL
TB2-NIL: 0.01 IU/mL

## 2020-11-04 LAB — IGE: IgE (Immunoglobulin E), Serum: 966 kU/L — ABNORMAL HIGH (ref <=114)

## 2020-11-04 LAB — GRAM STAIN
MICRO NUMBER:: 11321380
SPECIMEN QUALITY:: ADEQUATE

## 2020-11-04 NOTE — Telephone Encounter (Signed)
Called and spoke with pt letting him know the info stated by MR and also that this was being sent to pharmacy team due to plan being to add [name] with [name]. Pt verbalized understanding.  In case message had not been routed to [facility], routing encounter. Please advise.

## 2020-11-04 NOTE — Telephone Encounter (Signed)
Pt returning call from office for lab results . Please advise

## 2020-11-04 NOTE — Telephone Encounter (Signed)
Attempted to call pt but unable to reach. Left message for him to return call.

## 2020-11-04 NOTE — Telephone Encounter (Signed)
EMily: Let him know he has new anemia hgb 12s- > needs to talk to PCP.  And let him know his IgE is back up again. And I will plan for  Xolair addition to [place]. 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  He has had life-threatening hemoptysis.  He has been outpatient for over 10 years and is pretty miraculous like he has survived.  If you review his results his blood IgE is to be as high as 4000 and he is to get admitted with severe fever and fatigue along with asthma symptoms.  He really did well with Xolair but I believe it was stopped because of insurance reasons.  He was switched to [name] maybe over a year or 2 ago.  I have a feeling that he is breaking through the [place] at this point.  I think given his severe lung function disease and his comorbidities I think we need to go back on Xolair.  His blood IgE is gone up to 900.  Therefore please initiate paperwork for Xolair.  Might require a petition.  If so please do a draft and I can review

## 2020-11-04 NOTE — Telephone Encounter (Signed)
Spoke with the pt  He states he was unable to see his PCP until Feb 2022  He is going to UC regarding his low hemoglobin  I have faxed his results to them at [phone]

## 2020-11-04 NOTE — Telephone Encounter (Signed)
Patient would like to discuss about labs. Going to the Urgent Care now. Patient phone number is [phone].

## 2020-11-07 ENCOUNTER — Telehealth: Payer: Self-pay | Admitting: Internal Medicine

## 2020-11-07 ENCOUNTER — Telehealth: Payer: Self-pay | Admitting: Pharmacist

## 2020-11-07 NOTE — Telephone Encounter (Signed)
Submitted a Prior Authorization request to [employer] for [employer] via [employer]. Will update once we receive a response.  Cover My Meds Key: [id number]  Knox Saliva, PharmD, MPH Clinical Pharmacist (Rheumatology and Pulmonology)

## 2020-11-07 NOTE — Telephone Encounter (Signed)
Received notification from [name] to initiate Xolair BIV.  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 Currently, uncontrolled and breakthrough asthma exacerbations with [name] on board  IgE on 11/02/20 was 966  May require petition for Xolair approval. Will draft and send to [name].  Knox Saliva, PharmD, MPH Clinical Pharmacist (Rheumatology and Pulmonology)

## 2020-11-07 NOTE — Telephone Encounter (Signed)
[name] Order: 71m #1 prefilled syringe Ordered date: 11/07/20 Expected date of arrival: 11/08/20 Ordered by: LSanta Rosa [name]

## 2020-11-08 NOTE — Telephone Encounter (Signed)
Received notification from [facility] regarding a prior authorization for [employer]. Authorization has been APPROVED from 11/07/20 to 05/06/21.   Authorization # [id number]  Ran test claim, patient's copay is zero. No pharmacy restrictions. Patient can sign up for Xolair copay card.

## 2020-11-08 NOTE — Telephone Encounter (Signed)
[name] Shipment Received:  6m #1 prefilled syringe Medication arrival date: 11/08/20 Lot #: [id number] date: 12/19/2021 Received by: LElliot Dally

## 2020-11-09 DIAGNOSIS — F329 Major depressive disorder, single episode, unspecified: Secondary | ICD-10-CM | POA: Diagnosis not present

## 2020-11-09 DIAGNOSIS — R768 Other specified abnormal immunological findings in serum: Secondary | ICD-10-CM | POA: Diagnosis not present

## 2020-11-09 DIAGNOSIS — D649 Anemia, unspecified: Secondary | ICD-10-CM | POA: Diagnosis not present

## 2020-11-09 DIAGNOSIS — R634 Abnormal weight loss: Secondary | ICD-10-CM | POA: Diagnosis not present

## 2020-11-09 MED FILL — CRESEMBA 186 MG CAPSULE: 186 | 28 days supply | Qty: 56 | Fill #5

## 2020-11-15 NOTE — Telephone Encounter (Signed)
MR please advise on patient email:   Hey Dr. Gus Rankin I was wondering if your putting me back on xolair.Marland KitchenMarland KitchenI'm still having the same problems as my previous  visit but I'm thinking it's getting worse..my shortness of breath is getting worse & am constantly  coughing to no end...working has begun to be a problem also because of  coughing&breathing.. Per chart it looks like we had just ordered [name] but also just received a PA approval for [employer]. Please advise on how to proceed, thanks!

## 2020-11-16 DIAGNOSIS — R768 Other specified abnormal immunological findings in serum: Secondary | ICD-10-CM | POA: Diagnosis not present

## 2020-11-16 MED ORDER — PREDNISONE 10 MG PO TABS: ORAL_TABLET | ORAL | 0 refills | Status: DC

## 2020-11-16 NOTE — Telephone Encounter (Addendum)
Called patient and spoke about [place]. Awaiting Dr. Golden Pop recommendation about starting Xolair at same visit as [name].  Melrose Nakayama had also expired in October 2021 and awaiting renewal response - had to backdate as [name] was shipped from pharmacy on 11/07/20. Next Fasenra scheduled for 11/23/20. Will route to Salome Arnt as FYI since we won't hear back from [facility] until as late as 11/23/20.  Addendum: Melrose Nakayama renewed from 11/01/20 through 11/01/21 for 7 visits

## 2020-11-16 NOTE — Telephone Encounter (Addendum)
Spoke with [name] about Xolair. It is approved through his pharmacy benefits. [name] is billed through his medical benefit  Advised patient that as of now, he's approved for both but when renewals are due for both, he may be denied d/t being on both. Advised to visit [email or web address] to sign up for copay card to receive medication for $5. Also discussed needing to wait for 2 hours post-injection for first 3 doses.  Advised that he must wait for 2 hours post Xolair for first 3 doses. Dose would be 375 mg every 2 weeks. Discussed that decision for home self-administration would be revisited with [name] and based on pt's comfort level with self-injecting.  Also advised that the Xolair would be 3 injections in addition to the one injection Fasenra. He is comfortable with getting 4 injections on one day if [name] deems it okay. No clinical reason that would prevent administering both at once. Discussed with Dr. Annamaria Boots as well who has seen patients on both medications.  Xolair (rx) - 11/07/20 - 05/06/21 110m/ml syringe Fasenra (medical "buy and bill") - auth expired on 08/30/20; renewed over the phone and is now renewed from 11/01/20 through 11/01/21 for 7 visits.  Reference # [id number] Dr. RChase Caller- if the plan is to do both, are you okay to start Xolair at [name] next week?

## 2020-11-16 NOTE — Telephone Encounter (Signed)
Triage: I do not have a good solution for worsening lung issues other than adding xolair to fasenra. Pleae follow with [facility] in pharmacy as to where we are with xolair for patient.  In interim, we can increase prednisone as follow -> Please take Take prednisone 56m once daily x 3 days, then 343monce daily x 3 days, then 2032mnce daily x 3 days, then prednisone 73m87mce daily  To continue ( will be a higher dose than his baseline 5mg)46m   Current Outpatient Medications:  .  albuterol (PROVENTIL) (2.5 MG/3ML) 0.083% nebulizer solution, Take 3 mLs (2.5 mg total) by nebulization every 6 (six) hours as needed for wheezing or shortness of breath., Disp: 75 mL, Rfl: 12 .  albuterol (VENTOLIN HFA) 108 (90 Base) MCG/ACT inhaler, Inhale 2 puffs into the lungs every 6 (six) hours as needed., Disp: 18 g, Rfl: 2 .  azithromycin (ZITHROMAX) 500 MG tablet, Take 1 tablet by mouth once daily, Disp: 30 tablet, Rfl: 5 .  Budeson-Glycopyrrol-Formoterol (BREZTRI AEROSPHERE) 160-9-4.8 MCG/ACT AERO, Inhale 2 puffs into the lungs 2 (two) times daily., Disp: 10.7 g, Rfl: 5 .  Calcium Carbonate-Vitamin D 600-400 MG-UNIT tablet, Take 1 tablet by mouth daily. , Disp: , Rfl:  .  chlorpheniramine-HYDROcodone (TUSSIONEX PENNKINETIC ER) 10-8 MG/5ML SUER, Take 5 mLs by mouth at bedtime as needed for cough., Disp: 140 mL, Rfl: 0 .  EPINEPHrine 0.3 mg/0.3 mL IJ SOAJ injection, Inject 0.3 mLs (0.3 mg total) into the muscle once for 1 dose., Disp: 1 Device, Rfl: 11 .  ethambutol (MYAMBUTOL) 400 MG tablet, TAKE 2 AND 1/2 TABLETS BY MOUTH ONCE DAILY, Disp: 598 tablet, Rfl: 5 .  feeding supplement, ENSURE ENLIVE, (ENSURE ENLIVE) LIQD, Take 237 mLs by mouth 3 (three) times daily between meals. (Patient taking differently: Take 237 mLs by mouth 2 (two) times daily between meals.), Disp: 237 mL, Rfl: 12 .  guaiFENesin (MUCINEX) 600 MG 12 hr tablet, Take 1 tablet (600 mg total) by mouth 2 (two) times daily., Disp: 30 tablet, Rfl: 0 .   Isavuconazonium Sulfate (CRESEMBA) 186 MG CAPS, Take 2 capsules (372 mg total) by mouth daily., Disp: 56 capsule, Rfl: 5 .  levofloxacin (LEVAQUIN) 750 MG tablet, Take 1 tablet (750 mg total) by mouth daily., Disp: 14 tablet, Rfl: 1 .  levothyroxine (SYNTHROID) 88 MCG tablet, Take 100 mcg by mouth daily. , Disp: , Rfl:  .  predniSONE (DELTASONE) 5 MG tablet, Take 1 tablet (5 mg total) by mouth daily with breakfast., Disp: 90 tablet, Rfl: 1

## 2020-11-16 NOTE — Telephone Encounter (Signed)
Called and spoke with pt about the info stated by MR and he verbalized understanding. Also sent that info in a mychart message for pt at his request so that way he will be able to view and know that his new baseline dose for prednisone will be 29m instead of 5497m The tapering for the prednisone has been sent to pharmacy for pt. Pt will take 2tabs of the 97m797mhen he is down to doing the 14m22mily and when he finishes up the 97mg 46m pt will call office to let us [place] so that way we can then send Rx for prednisone 14mg 78mhat will be his new daily prednisone dose.  In regards to pt's Xolair, Devki, please advise on this.

## 2020-11-21 NOTE — Telephone Encounter (Signed)
Devki: thjanks for detailed write up. Appreciate it. Yes, plan is to do both xolair + fasenra and if mu;multiple injections on same day so be it. His lungs are in bad shape

## 2020-11-22 NOTE — Telephone Encounter (Signed)
Sample of Xolair marked in injection room fridge. Discussed with Salome Arnt that the plan is to receive both Fasenra and Xolair at tomorrow's infusions. Xolair does is 375 mg every 2 weeks  Discussed with patient including 2 hour waiting time and Epipen requirement. He verbalized understanding. Will discuss Xolair self-administration with him tomorrow during his 2 hour waiting period. [name] will have to okay self-administration. Will send Xolair rx to [facility] tomorrow as well.

## 2020-11-23 ENCOUNTER — Telehealth: Payer: Self-pay | Admitting: Internal Medicine

## 2020-11-23 ENCOUNTER — Other Ambulatory Visit: Payer: Self-pay

## 2020-11-23 ENCOUNTER — Encounter: Payer: Self-pay | Admitting: Infectious Disease

## 2020-11-23 ENCOUNTER — Ambulatory Visit (INDEPENDENT_AMBULATORY_CARE_PROVIDER_SITE_OTHER): Payer: BC Managed Care – PPO | Admitting: Infectious Disease

## 2020-11-23 ENCOUNTER — Ambulatory Visit (INDEPENDENT_AMBULATORY_CARE_PROVIDER_SITE_OTHER): Payer: BC Managed Care – PPO

## 2020-11-23 VITALS — BP 113/71 | HR 74 | Temp 98.3°F | Resp 16 | Ht 68.0 in | Wt 120.0 lb

## 2020-11-23 DIAGNOSIS — B449 Aspergillosis, unspecified: Secondary | ICD-10-CM | POA: Diagnosis not present

## 2020-11-23 DIAGNOSIS — B181 Chronic viral hepatitis B without delta-agent: Secondary | ICD-10-CM | POA: Diagnosis not present

## 2020-11-23 DIAGNOSIS — B4481 Allergic bronchopulmonary aspergillosis: Secondary | ICD-10-CM | POA: Diagnosis not present

## 2020-11-23 DIAGNOSIS — J4550 Severe persistent asthma, uncomplicated: Secondary | ICD-10-CM

## 2020-11-23 DIAGNOSIS — J930 Spontaneous tension pneumothorax: Secondary | ICD-10-CM

## 2020-11-23 DIAGNOSIS — B440 Invasive pulmonary aspergillosis: Secondary | ICD-10-CM

## 2020-11-23 DIAGNOSIS — A310 Pulmonary mycobacterial infection: Secondary | ICD-10-CM

## 2020-11-23 DIAGNOSIS — L568 Other specified acute skin changes due to ultraviolet radiation: Secondary | ICD-10-CM

## 2020-11-23 HISTORY — DX: Chronic viral hepatitis B without delta-agent: B18.1

## 2020-11-23 MED ORDER — BENRALIZUMAB 30 MG/ML ~~LOC~~ SOSY
30.0000 mg | PREFILLED_SYRINGE | Freq: Once | SUBCUTANEOUS | Status: AC
  Administered 2020-11-23: 30 mg via SUBCUTANEOUS

## 2020-11-23 MED ORDER — EPINEPHRINE 0.3 MG/0.3ML IJ SOAJ: 0.3000 mg | Freq: Once | INTRAMUSCULAR | 11 refills | Status: DC

## 2020-11-23 NOTE — Telephone Encounter (Signed)
Patient received Fasenra injection today. Epipen he brought was expired. Rescheduled to receive Xolair in 2 weeks 11/06/20. [name] sent new EpiPen rx today.

## 2020-11-23 NOTE — Progress Notes (Signed)
Chief complaint:   Subjective:    : Patient ID: [name], male    DOB: [birth year], 45 y.o.   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 We had taken him off his Mycobacterium avium drugs and then he developed fevers chills and malaise  Spring (2013) and was seen by my partner Dr. Megan Salon in late April  His AFB smears and cultures obtained in the clinic on that day prior to the initiation of azithromycin ethambutol ultimately failed to grow Mycobacterium avium. He had been doing well I saw him in [place] 2013 we discontinued his azithromycin and ethambutol again. Unfortunately he again shortly thereafter developed fevers cough malaise. He started back on azithromycin ethambutol. Cultures done for AFB here were again negative. The patient however felt dramatically better having been back on azithromycin and ethambutol.   I saw him in January of 2015 when he was feeling relatively well.  He did have an episode of coughing up a few flecks of blood in January 2016 which prompted appt with [facility] but by time he made appt this had long since resolved. Was only a one time episode none since then.  I saw again in March and then in September 2016. In summer he was hospitalized with hemoptysis and found to have an area in the upper lobe consistent with an aspergilloma. He underwent bronchoscopy with BAL and cultures ultimately yielded a Saccharomyces species on fungal culture. His serum galactomannan was negative.  He had been  on voriconazole and axial has had radiographic improvement and his x-ray findings. He also feelt etter symptomatically with no more hemoptysis improvement in his cough  improvement in his energy though he still feels not nearly as well as he did prior to his hospitalization. He is avoiding exercised during the winter as it often precipitates coughing spells. He was  only taking 200 mg twice a day of voriconazole rather than the 300 mg he has continued this along with his ethambutol and azithromycin. He continued to have cough more so with exercise and has not been exercising at "[employer]" due to the fact this elicits more coughing. He has not smoked since 2007. He is seeing [name] and is being considered for new drug infusion to treat his COPD.  He did have 20# weight loss that he attributed to increased exercise.  He then saw  [name] after an episode of hematemesis. 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 He said he only coughed up a small amount of blood material and did not go to the ER but was seen and followed by pulmonary. Had a chest x-ray which showed no new findings.  He was then  going to be evaluated by the transplant team at [facility].  He did complain of hyperpigmented spots on sun exposed areas on his body including arms and neck which his dermatologist have told him is due to his voriconazole use.   We considered change at that time  Since that time he has Boy[place] been admitted with hemoptysis at [facility].   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 There is interval development of fluid density  in several bulla in the left upper lobe concerning for superimposed infection, possibly fungal in origin.  Stable bilateral calcified pulmonary nodules are noted consistent with prior granulomatous disease.  Stable tree in bud appearance is noted medially in the left lung posteriorly consistent with sequela of previous atypical infection.   Due to his skin toxicity we decided to change him to [facility] and he is on that now .  And had done  well.  He had problems with bilateral pneumothoraces.  This required placement of an intrabronchial valve in November 2019.  This resolved but then he had to be taken back to the operating room in early January where the intrabronchial valves were removed.  Was seen at [facility] medical transplant team as well but felt not yet to be a transplant candidate due to the fact that his lungs were not in severe enough condition to warrant transplantation  He had applied for disability but this has not yet gone through.  Certainly he deserves to have disability and he has significant medical problems.  He did however eventually go back to work at [employer]  He is been receiving his antimycobacterial drugs and is [place].  I last saw cans he was hospitalized in July for what he says was pneumonia.  The CT scan was read as showing:  " Chronic narrowing and occlusion of several right upper lobe segmental subsegmental pulmonary arteries and filling defects in the left upper lobe segmental and subsegmental branches which were within the regions of bullous disease which are suggestive of chronic embolism/occlusion.  It showed extensive bullous and paraseptal predominant emphysematous changes with thick-walled cystic bullae toward the apices with nodularity and air layering air-fluid levels within the left apical bulla consistent with aspergilloma.  He had mixed areas of groundglass and consolidative opacity left lower lobe and right lower lobe periphery.  Nares note indicates that he was not anticoagulated due to the fact that he has recurrent hemoptysis per the patient himself was unaware that he was found to have anything suggestive clots on scan and believes he was only treated for pneumonia.  Since I last saw Jerran he was treated with Augmentin and corticosteroids for flare of bronchitis.  He improved on both steroids and antibiotics but within 3 to 5 days after coming off the Augmentin had  worsening cough with productive sputum particularly in the morning some with some blood streaking.  He had seen pulmonary who had recommended him come see [place] again and I saw him in November  He hadnot had fevers chills or other systemic symptoms.  He was apprehensive that he could be harboring resistant organisms.  I think there is certainly possibility that his mycobacteria could have developed some resistance and/or that he has a superimposed bacterial infection yet again.  At the time we switched out his azithromycin for levofloxacin.  His cough may have improved a bit in terms of his severity.  He says about 10 out of 10 in severity when he saw [place] last versus a 6-7.  Sputum is changed slightly now with a red tinge to it.  He was also seen by hematology oncology to work-up anemia at [facility].  In the context of his weight loss they also checked him for viral hepatitis panel and Kantz hepatitis B surface antigen was positive along with a hepatitis B DNA though at a low value value of 2050 copies  Since seen [name] and is starting Xolair in addition to  the [name] he is already taking along with systemic prednisone (in midst of a taper)    Past Medical History:  Diagnosis Date  . Anemia   . 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[place])   . Drug rash 12/16/2017  . Dyspnea   . Esophageal reflux    very rare  . 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 Lung disease, bullous ([place])   . MAI (mycobacterium avium-intracellulare) ([place])   . Mold exposure 12/05/2015  . 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 Solar lentigo 08/08/2015  . Weight loss 10/15/2016   Past Surgical History:  Procedure Laterality Date  . CHEST TUBE INSERTION Right 09/29/2018   Procedure: CHEST TUBE REPLACEMENT;  Surgeon: Melrose Nakayama, MD;  Location: [place];  Service: Thoracic;  Laterality: Right;  . INSERTION OF IBV VALVE N/A 09/19/2018   Procedure: INSERTION OF INTERBRONCHIAL  VALVE (IBV);  Surgeon: Melrose Nakayama, MD;  Location[facility] OR;  Service: Thoracic;  Laterality: N/A;  . INSERTION OF IBV VALVE N/A 12/01/2018   Procedure: REMOVAL OF INTERBRONCHIAL VALVE (IBV);  Surgeon: Melrose Nakayama, MD;  Location: [facility] OR;  Service: Thoracic;  Laterality: N/A;  . left VATS  2012   thoracotomy and LUL apical posterior segmentectomy  . STAPLING OF BLEBS Right 09/12/2018   Procedure: STAPLING OF BLEBS, right lower and middle lung lobes;  Surgeon: Melrose Nakayama, MD;  Location: [place];  Service: Thoracic;  Laterality: Right;  Marland Kitchen VIDEO ASSISTED THORACOSCOPY Right 09/12/2018   Procedure: VIDEO ASSISTED THORACOSCOPY, right lung;  Surgeon: Melrose Nakayama, MD;  Location: [place];  Service: Thoracic;  Laterality: Right;  Marland Kitchen VIDEO BRONCHOSCOPY Bilateral 10/20/2015   Procedure: VIDEO BRONCHOSCOPY WITHOUT FLUORO;  Surgeon: Marshell Garfinkel, MD;  Location: [place];  Service: Cardiopulmonary;  Laterality: Bilateral;  . VIDEO BRONCHOSCOPY N/A 09/19/2018   Procedure: VIDEO BRONCHOSCOPY;  Surgeon: Melrose Nakayama, MD;  Location: [facility] OR;  Service: Thoracic;  Laterality: N/A;  . VIDEO BRONCHOSCOPY N/A 12/01/2018   Procedure: VIDEO BRONCHOSCOPY;  Surgeon: Melrose Nakayama, MD;  Location: [place];  Service: Thoracic;  Laterality: N/A;  . VIDEO BRONCHOSCOPY WITH INSERTION OF INTERBRONCHIAL VALVE (IBV) N/A 09/29/2018   Procedure: VIDEO BRONCHOSCOPY WITH INSERTION OF INTERBRONCHIAL VALVE  (IBV);  Surgeon: Melrose Nakayama, MD;  Location: [facility] OR;  Service: Thoracic;  Laterality: N/A;   Family History  Adopted: Yes   Social History   Tobacco Use  . Smoking status: Former Smoker    Packs/day: 0.70    Years: 19.00    Pack years: 13.30    Types: Cigarettes    Quit date: 11/20/2007    Years since quitting: 13.0  . Smokeless tobacco: Former Systems developer    Types: Snuff    Quit date: 11/19/1994  Vaping Use  . Vaping Use: Never used  Substance Use Topics  . Alcohol use: No   . Drug use: No    Allergies  Allergen Reactions  . Clarithromycin Other (See Comments)    Adverse reaction w/ voriconazole UNSPECIFIED REACTION   . Rifaximin Other (See Comments)    Adverse reaction w/ voriconazole UNSPECIFIED REACTION     Current Outpatient Medications:  .  albuterol (PROVENTIL) (2.5 MG/3ML) 0.083% nebulizer solution, Take 3 mLs (2.5 mg total) by nebulization every 6 (six) hours as needed for wheezing or shortness of breath., Disp: 75 mL, Rfl: 12 .  albuterol (VENTOLIN HFA) 108 (90 Base) MCG/ACT inhaler, Inhale 2 puffs into the lungs every 6 (six) hours as needed.,  Disp: 18 g, Rfl: 2 .  azithromycin (ZITHROMAX) 500 MG tablet, Take 1 tablet by mouth once daily, Disp: 30 tablet, Rfl: 5 .  Budeson-Glycopyrrol-Formoterol (BREZTRI AEROSPHERE) 160-9-4.8 MCG/ACT AERO, Inhale 2 puffs into the lungs 2 (two) times daily., Disp: 10.7 g, Rfl: 5 .  Calcium Carbonate-Vitamin D 600-400 MG-UNIT tablet, Take 1 tablet by mouth daily. , Disp: , Rfl:  .  chlorpheniramine-HYDROcodone (TUSSIONEX PENNKINETIC ER) 10-8 MG/5ML SUER, Take 5 mLs by mouth at bedtime as needed for cough., Disp: 140 mL, Rfl: 0 .  EPINEPHrine 0.3 mg/0.3 mL IJ SOAJ injection, Inject 0.3 mLs (0.3 mg total) into the muscle once for 1 dose., Disp: 1 Device, Rfl: 11 .  ethambutol (MYAMBUTOL) 400 MG tablet, TAKE 2 AND 1/2 TABLETS BY MOUTH ONCE DAILY, Disp: 598 tablet, Rfl: 5 .  feeding supplement, ENSURE ENLIVE, (ENSURE ENLIVE) LIQD, Take 237 mLs by mouth 3 (three) times daily between meals. (Patient taking differently: Take 237 mLs by mouth 2 (two) times daily between meals.), Disp: 237 mL, Rfl: 12 .  guaiFENesin (MUCINEX) 600 MG 12 hr tablet, Take 1 tablet (600 mg total) by mouth 2 (two) times daily., Disp: 30 tablet, Rfl: 0 .  Isavuconazonium Sulfate (CRESEMBA) 186 MG CAPS, Take 2 capsules (372 mg total) by mouth daily., Disp: 56 capsule, Rfl: 5 .  levofloxacin (LEVAQUIN) 750 MG tablet, Take 1 tablet (750 mg total) by  mouth daily., Disp: 14 tablet, Rfl: 1 .  levothyroxine (SYNTHROID) 88 MCG tablet, Take 100 mcg by mouth daily. , Disp: , Rfl:  .  predniSONE (DELTASONE) 10 MG tablet, Take 4 tablets (40 mg total) by mouth daily with breakfast for 3 days, THEN 3 tablets (30 mg total) daily with breakfast for 3 days, THEN 2 tablets (20 mg total) daily with breakfast for 3 days. Then continue on 53m daily.., Disp: 27 tablet, Rfl: 0    Review of Systems  Constitutional: Positive for fatigue and unexpected weight change. Negative for activity change, appetite change, chills, diaphoresis and fever.  HENT: Negative for congestion, rhinorrhea, sinus pressure, sneezing, sore throat and trouble swallowing.   Eyes: Negative for photophobia and visual disturbance.  Respiratory: Positive for cough and shortness of breath. Negative for apnea, chest tightness, wheezing and stridor.   Cardiovascular: Negative for chest pain, palpitations and leg swelling.  Gastrointestinal: Negative for abdominal pain, anal bleeding, blood in stool, constipation, diarrhea, nausea and vomiting.  Genitourinary: Negative for difficulty urinating, dysuria, flank pain and hematuria.  Musculoskeletal: Negative for arthralgias, back pain, gait problem, joint swelling and myalgias.  Skin: Negative for color change, pallor and wound.  Neurological: Negative for dizziness, tremors, weakness and light-headedness.  Hematological: Negative for adenopathy. Does not bruise/bleed easily.  Psychiatric/Behavioral: Negative for agitation, behavioral problems, confusion, decreased concentration, dysphoric mood and sleep disturbance.       Objective:   Physical Exam Constitutional:      General: He is not in acute distress.    Appearance: He is not diaphoretic.  HENT:     Head: Normocephalic and atraumatic.  Eyes:     General: No scleral icterus.    Conjunctiva/sclera: Conjunctivae normal.  Cardiovascular:     Rate and Rhythm: Normal rate and regular  rhythm.     Heart sounds: Normal heart sounds. No murmur heard. No friction rub. No gallop.   Pulmonary:     Effort: Prolonged expiration present. No respiratory distress.     Breath sounds: Examination of the right-lower field reveals decreased breath sounds. Decreased breath  sounds, wheezing and rhonchi present.  Abdominal:     General: There is no distension.     Palpations: Abdomen is soft.     Tenderness: There is no abdominal tenderness. There is no rebound.  Musculoskeletal:        General: No tenderness.     Cervical back: Normal range of motion and neck supple.  Lymphadenopathy:     Cervical: No cervical adenopathy.  Skin:    General: Skin is warm and dry.     Coloration: Skin is not pale.     Findings: No erythema.       Neurological:     General: No focal deficit present.     Mental Status: He is alert and oriented to person, place, and time.     Motor: No abnormal muscle tone.     Coordination: Coordination normal.  Psychiatric:        Mood and Affect: Mood normal.        Behavior: Behavior normal.        Thought Content: Thought content normal.        Judgment: Judgment normal.          Assessment & Plan:  Chronic hepatitis B without hepatic,:  He was referred [facility] hepatology [facility] but I think we can certainly take care of his hepatitis B here at our infectious disease clinic.  I suspect we never screened him for this before because he had had normal liver function tests for the most part and not needed dialysis.  I expect he probably acquired hepatitis B perinatally.  His hep B DNA level is low surface antigen positive we do not have a hep B E antigen or hep E antigen antibody.  We also will check a hepatitis B delta agent  He is immune to hepatitis A and does not have hepatitis C.  Liver function tests have been normal and we have checked them recently but I will recheck him again today.  We will screen for hepatocellular carcinoma with a right  upper quadrant ultrasound.  To me not clearly meet criteria for treatment for his chronic hepatitis B without hepatic coma with the one caveat being that he is being placed on potent immunomodulatory drugs for his allergic bronchopulmonary aspergillosis.  I spoken to clinical infectious disease pharmacist Cassie Kuppelweiser who will dig deeper on concern for HBV flaring on these drugs.   I did explain to cannot at the current paradigm for hepatitis B involves reacting to labs and risk for hep B reactivation and unlike hepatitis C is not easily curable without years of treatment.  I will rescreen him for HIV  Possible community acquired pneumonia: Graphically he did not have evidence of this not clear if he did respond to levofloxacin or not  Aspergilloma: continue lifelong antifungal therapy: currently on CRESEMBA.  Fungal cultures are coming from sputum that was sent  Allergic bronchopulmonary aspgergillosis:   I would be more comfortable with thim being on long standing antifungal therapy.   Is now also starting Xolair in addition to [employer] exposed rash from voriconozole: We changed to Cresemba and it seems improved   Mycobacterium avium pulmonary infection:  I am concerned that IF he has developed long term resistance to an antimicrobial that M avium would be the more likely organism to have developed this  ?  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   Anemia: likely multifactorial being worked up at [facility]  I spent greater than 40 minutes with the patient including greater than 50% of time in face to face counsel of the patient the nature of chronic hepatitis B, treatment modalities for this, his allergic bronchopulmonary aspergillosis and Mycobacterium avium infection and in coordination of his care and  in coordination of hiscare.

## 2020-11-23 NOTE — Progress Notes (Signed)
Have you been hospitalized within the last 10 days?  No Do you have a fever?  No Do you have a cough?  No Do you have a headache or sore throat? No  Patient epipen expired. Patient waited 30 minutes after Fasenra injection, no signs of side/adverse effects. New Epipen prescription sent to [facility]. Patient scheduled 12/07/20 to start Xolair. Patient aware of office new start policy.  Marland Kitchen

## 2020-11-23 NOTE — Telephone Encounter (Signed)
Spoke with Patient.  Patient rescheduled to 11/30/20 at 1430.  Patient is aware of office protocol and 2 hour wait. Nothing further at this time.

## 2020-11-27 LAB — COMPLETE METABOLIC PANEL WITH GFR
AG Ratio: 1.2 (calc) (ref 1.0–2.5)
ALT: 18 U/L (ref 9–46)
AST: 17 U/L (ref 10–40)
Albumin: 3.5 g/dL — ABNORMAL LOW (ref 3.6–5.1)
Alkaline phosphatase (APISO): 85 U/L (ref 36–130)
BUN: 12 mg/dL (ref 7–25)
CO2: 31 mmol/L (ref 20–32)
Calcium: 8.6 mg/dL (ref 8.6–10.3)
Chloride: 102 mmol/L (ref 98–110)
Creat: 0.8 mg/dL (ref 0.60–1.35)
GFR, Est African American: 124 mL/min/{1.73_m2} (ref >=60)
GFR, Est Non African American: 107 mL/min/{1.73_m2} (ref >=60)
Globulin: 2.9 g/dL (calc) (ref 1.9–3.7)
Glucose, Bld: 86 mg/dL (ref 65–99)
Potassium: 3.7 mmol/L (ref 3.5–5.3)
Sodium: 141 mmol/L (ref 135–146)
Total Bilirubin: 0.4 mg/dL (ref 0.2–1.2)
Total Protein: 6.4 g/dL (ref 6.1–8.1)

## 2020-11-27 LAB — CBC WITH DIFFERENTIAL/PLATELET
Absolute Monocytes: 300 cells/uL (ref 200–950)
Basophils Absolute: 0 cells/uL (ref 0–200)
Basophils Relative: 0 %
Eosinophils Absolute: 0 cells/uL — ABNORMAL LOW (ref 15–500)
Eosinophils Relative: 0 %
HCT: 37.9 % — ABNORMAL LOW (ref 38.5–50.0)
Hemoglobin: 12.8 g/dL — ABNORMAL LOW (ref 13.2–17.1)
Lymphs Abs: 540 cells/uL — ABNORMAL LOW (ref 850–3900)
MCH: 32.2 pg (ref 27.0–33.0)
MCHC: 33.8 g/dL (ref 32.0–36.0)
MCV: 95.2 fL (ref 80.0–100.0)
MPV: 8.7 fL (ref 7.5–12.5)
Monocytes Relative: 2.5 %
Neutro Abs: 11160 cells/uL — ABNORMAL HIGH (ref 1500–7800)
Neutrophils Relative %: 93 %
Platelets: 341 10*3/uL (ref 140–400)
RBC: 3.98 10*6/uL — ABNORMAL LOW (ref 4.20–5.80)
RDW: 13.3 % (ref 11.0–15.0)
Total Lymphocyte: 4.5 %
WBC: 12 10*3/uL — ABNORMAL HIGH (ref 3.8–10.8)

## 2020-11-27 LAB — HEPATITIS B E ANTIBODY: Hep B E Ab: REACTIVE — AB

## 2020-11-27 LAB — HEPATITIS B E ANTIGEN: Hep B E Ag: NONREACTIVE

## 2020-11-27 LAB — HIV ANTIBODY (ROUTINE TESTING W REFLEX): HIV 1&2 Ab, 4th Generation: NONREACTIVE

## 2020-11-27 LAB — HEPATITIS DELTA VIRUS ANTIGEN: Hepatitis D Antigen: NOT DETECTED

## 2020-11-30 ENCOUNTER — Ambulatory Visit: Payer: BC Managed Care – PPO | Admitting: Adult Health

## 2020-11-30 ENCOUNTER — Other Ambulatory Visit: Payer: Self-pay | Admitting: *Deleted

## 2020-11-30 ENCOUNTER — Other Ambulatory Visit: Payer: Self-pay

## 2020-11-30 ENCOUNTER — Ambulatory Visit (INDEPENDENT_AMBULATORY_CARE_PROVIDER_SITE_OTHER): Payer: BC Managed Care – PPO | Admitting: Internal Medicine

## 2020-11-30 ENCOUNTER — Ambulatory Visit (HOSPITAL_COMMUNITY)
Admission: RE | Admit: 2020-11-30 | Discharge: 2020-11-30 | Disposition: A | Discharge status: Home / Self Care | Payer: BC Managed Care – PPO | Source: Ambulatory Visit | Attending: Infectious Disease | Admitting: Infectious Disease

## 2020-11-30 ENCOUNTER — Encounter: Payer: Self-pay | Admitting: Adult Health

## 2020-11-30 ENCOUNTER — Ambulatory Visit: Payer: BC Managed Care – PPO

## 2020-11-30 DIAGNOSIS — E43 Unspecified severe protein-calorie malnutrition: Secondary | ICD-10-CM

## 2020-11-30 DIAGNOSIS — J449 Chronic obstructive pulmonary disease, unspecified: Secondary | ICD-10-CM | POA: Diagnosis not present

## 2020-11-30 DIAGNOSIS — K8020 Calculus of gallbladder without cholecystitis without obstruction: Secondary | ICD-10-CM | POA: Diagnosis not present

## 2020-11-30 DIAGNOSIS — B449 Aspergillosis, unspecified: Secondary | ICD-10-CM | POA: Diagnosis not present

## 2020-11-30 DIAGNOSIS — J4550 Severe persistent asthma, uncomplicated: Secondary | ICD-10-CM

## 2020-11-30 DIAGNOSIS — A310 Pulmonary mycobacterial infection: Secondary | ICD-10-CM | POA: Diagnosis not present

## 2020-11-30 DIAGNOSIS — B181 Chronic viral hepatitis B without delta-agent: Secondary | ICD-10-CM | POA: Insufficient documentation

## 2020-11-30 DIAGNOSIS — B4481 Allergic bronchopulmonary aspergillosis: Secondary | ICD-10-CM | POA: Diagnosis not present

## 2020-11-30 DIAGNOSIS — J4489 Other specified chronic obstructive pulmonary disease: Secondary | ICD-10-CM

## 2020-11-30 DIAGNOSIS — J479 Bronchiectasis, uncomplicated: Secondary | ICD-10-CM

## 2020-11-30 LAB — PULMONARY FUNCTION TEST
DL/VA % pred: 123 %
DL/VA: 5.61 ml/min/mmHg/L
DLCO cor % pred: 56 %
DLCO cor: 16.67 ml/min/mmHg
DLCO unc % pred: 53 %
DLCO unc: 15.81 ml/min/mmHg
FEF 25-75 Post: 0.63 L/sec
FEF 25-75 Pre: 0.55 L/sec
FEF2575-%Change-Post: 13 %
FEF2575-%Pred-Post: 18 %
FEF2575-%Pred-Pre: 16 %
FEV1-%Change-Post: 3 %
FEV1-%Pred-Post: 34 %
FEV1-%Pred-Pre: 33 %
FEV1-Post: 1.22 L
FEV1-Pre: 1.17 L
FEV1FVC-%Change-Post: 0 %
FEV1FVC-%Pred-Pre: 69 %
FEV6-%Change-Post: 3 %
FEV6-Post: 2.16 L
FEV6-Pre: 2.09 L
FVC-%Change-Post: 3 %
FVC-%Pred-Post: 49 %
FVC-%Pred-Pre: 47 %
FVC-Post: 2.16 L
FVC-Pre: 2.09 L
Post FEV1/FVC ratio: 56 %
Post FEV6/FVC ratio: 100 %
Pre FEV1/FVC ratio: 56 %
Pre FEV6/FVC Ratio: 100 %

## 2020-11-30 MED ORDER — OMALIZUMAB 150 MG/ML ~~LOC~~ SOSY
300.0000 mg | PREFILLED_SYRINGE | Freq: Once | SUBCUTANEOUS | Status: AC
  Administered 2020-11-30: 300 mg via SUBCUTANEOUS

## 2020-11-30 MED ORDER — OMALIZUMAB 75 MG/0.5ML ~~LOC~~ SOSY
75.0000 mg | PREFILLED_SYRINGE | Freq: Once | SUBCUTANEOUS | Status: AC
  Administered 2020-11-30: 75 mg via SUBCUTANEOUS

## 2020-11-30 NOTE — Assessment & Plan Note (Signed)
Continue on current regimen.  Patient has high symptom burden and daily mucus production.  We'll continue on current regimen.  Add in vest therapy to help with mucociliary clearance.  Plan  Patient Instructions  Continue on BREZTRI 2 puffs Twice daily  , rinse after use.  Remain on Prednisone 58m daily  Flutter valve Three times a day  .  Please eat often,  High protein food and drinks.  Continue on Fasenra.  Continue with ID follow up.  Albuterol inhaler As needed  Wheezing/shortness of breath .  Albuterol neb every 6hrs as needed for wheezing /shortness of breath as needed.  Robitussin DM As needed  Cough/congestion  Begin VEST therapy Twice daily  .  Follow up with Dr. RChase Calleror [place] NP in 2 months and As needed   Please contact office for sooner follow up if symptoms do not improve or worsen or seek emergency care

## 2020-11-30 NOTE — Patient Instructions (Addendum)
Continue on BREZTRI 2 puffs Twice daily  , rinse after use.  Remain on Prednisone 58m daily  Flutter valve Three times a day  .  Please eat often,  High protein food and drinks.  Continue on Fasenra.  Continue with ID follow up.  Albuterol inhaler As needed  Wheezing/shortness of breath .  Albuterol neb every 6hrs as needed for wheezing /shortness of breath as needed.  Robitussin DM As needed  Cough/congestion  Begin VEST therapy Twice daily  .  Follow up with Dr. RChase Calleror [place] NP in 2 months and As needed   Please contact office for sooner follow up if symptoms do not improve or worsen or seek emergency care

## 2020-11-30 NOTE — Progress Notes (Signed)
See note under Tammy, NP OV visit for Xolair administration.

## 2020-11-30 NOTE — Assessment & Plan Note (Addendum)
MAC continue on current regimen continue follow with infectious disease Patient remains on flutter valve therapy mucolytic's bronchodilators and continues to have high symptom burden with daily cough with significant mucus production.  He has multiple pulmonary issues.  Needs additional mucociliary clearance.  We'll add vest therapy.  Plan  Patient Instructions  Continue on BREZTRI 2 puffs Twice daily  , rinse after use.  Remain on Prednisone 42m daily  Flutter valve Three times a day  .  Please eat often,  High protein food and drinks.  Continue on Fasenra.  Continue with ID follow up.  Albuterol inhaler As needed  Wheezing/shortness of breath .  Albuterol neb every 6hrs as needed for wheezing /shortness of breath as needed.  Robitussin DM As needed  Cough/congestion  Begin VEST therapy Twice daily  .  Follow up with Dr. RChase Calleror Sharunda Salmon NP in 2 months and As needed   Please contact office for sooner follow up if symptoms do not improve or worsen or seek emergency care

## 2020-11-30 NOTE — Assessment & Plan Note (Signed)
Continue on lifelong antifungal's with infectious disease

## 2020-11-30 NOTE — Progress Notes (Signed)
pft  

## 2020-11-30 NOTE — Progress Notes (Signed)
_0  ID: [name], male    DOB: 1976-11-11, 45 y.o.   MRN: [id number]  Chief Complaint  Patient presents with  . 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 CT chest July 2021 showed chronic narrowing and occlusion of several of the right upper lobe segmental and subsegmental pulmonary arteries additional filling defects in the left upper lobe segmental and subsegmental branches.  Extensive bullous disease.  Architectural distortion most suggestive of chronic embolus/occlusion, right atrial enlargement , Groundglass and consolidative opacity in the left lower and right lower periphery  Sputum culture July 2021 + for Klebsiella pneumonia -pansensitive except for ampicillin.  August 2021 2D echo showed EF 53%, grade 1 diastolic dysfunction mildly elevated pulmonary artery systolic pressure at 39 mmHg  Events  2010 Dx with MAI >Azithro/Ethambutol 2012 Dx with Aspergilloma >VATS >started on Voriconazaol  2012 Dx w/ ABPA w/ High IgE >4000>started on prednisone  2013 tried off azithr/Etham but developed fever, Cx  neg but restarted on rx.  2016 Hospitalzed with hemoptysis >IR guided embolization . FOB/BAL + Saccharomyces on fungal fx . Serum Galactomannan was neg.  [name] evaluation 04/2017 for Transplant -rejected. Felt to early for transplant- recommended pre-transplant weight goal 128 lbs for a BMI > 19  11/30/2020 Follow up : Asthma, ABPA, Aspergilloma, MAC  Patient returns for 1 month follow-up.  Patient says overall breathing is doing about the same.  He has good and bad days.  Has daily cough with thick mucus production.  He uses the flutter valve nebulizers and inhalers.  Continues to have significant cough though.  Patient works 40 hours a week.  His cough makes it very difficult on him. Patient denies any fever chest pain orthopnea PND or leg swelling.  COVID vaccines times 2+ booster.  Pulmonary function testing was done today and showed stable lung function from last visit but a decrease from 2017.  FEV1 is 34%, ratio 56, FVC 49%, no significant bronchodilator response.  DLCO is decreased from 59% to 53%.   Patient continues to follow-up with infectious disease for MAC and is on ethambutol and azithromycin.  Has history of aspergilloma and remains on lifelong antifungal therapy with Cresemba .  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 Of note patient is being followed by infectious disease as recent lab work showed positive hepatitis B. -hepatitis B surface antigen was positive. At last visit with recommendations to consider changing from [facility] to [place].  There was also concern about mounting enough antibody response to the COVID vaccines.  Patient has had his booster.  COVID 2 IgG last visit was reactive at 9.40. Patient is considered immunosuppressed.  Will follow guidelines for  additional booster.  His last booster was September 2021.   Allergies  Allergen Reactions  . Clarithromycin Other (See Comments)    Adverse reaction w/ voriconazole UNSPECIFIED  REACTION   . Rifaximin Other (See Comments)    Adverse reaction w/ voriconazole UNSPECIFIED REACTION     Immunization History  Administered Date(s) Administered  . DTaP 11/19/2010  . Influenza Split 10/29/2011, 08/14/2012  . Influenza Whole 09/19/2010  . Influenza,inj,Quad PF,6+ Mos 11/23/2013, 09/28/2014, 09/06/2015, 08/27/2016, 08/10/2017, 09/09/2018, 08/06/2019, 08/10/2020  . PFIZER SARS-COV-2 Vaccination 02/18/2020, 03/14/2020, 08/10/2020  . Pneumococcal Conjugate-13 04/29/2017, 04/29/2017  . Pneumococcal Polysaccharide-23 12/17/2010    Past Medical History:  Diagnosis Date  . Anemia   . 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[place])   . Chronic viral hepatitis B without delta-agent ([place]) 11/23/2020  . Drug rash 12/16/2017  . Dyspnea   . Esophageal reflux    very rare  . 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 Lung disease, bullous ([place])   . MAI (mycobacterium avium-intracellulare) ([place])   . Mold exposure 12/05/2015  . 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 Solar lentigo 08/08/2015  . Weight loss 10/15/2016    Tobacco History: Social History   Tobacco Use  Smoking Status Former Smoker  . Packs/day: 0.70  . Years: 19.00  . Pack years: 13.30  . Types: Cigarettes  . Quit date: 11/20/2007  . Years since quitting: 13.0  Smokeless Tobacco Former Systems developer  . Types: Snuff  . Quit date: 11/19/1994   Counseling given: Not Answered   Outpatient Medications Prior to Visit  Medication Sig Dispense Refill  . albuterol (PROVENTIL) (2.5 MG/3ML) 0.083% nebulizer solution Take 3 mLs (2.5 mg total) by nebulization every 6 (six) hours as needed for wheezing or shortness of breath. 75 mL 12  . albuterol (VENTOLIN HFA) 108 (90 Base) MCG/ACT inhaler Inhale 2 puffs into the lungs every 6 (six) hours as needed. 18 g 2  . Budeson-Glycopyrrol-Formoterol (BREZTRI AEROSPHERE) 160-9-4.8 MCG/ACT AERO Inhale 2 puffs into the lungs 2 (two) times daily. 10.7 g 5  . Calcium Carbonate-Vitamin D 600-400  MG-UNIT tablet Take 1 tablet by mouth daily.     Marland Kitchen ethambutol (MYAMBUTOL) 400 MG tablet TAKE 2 AND 1/2 TABLETS BY MOUTH ONCE DAILY 598 tablet 5  . feeding supplement, ENSURE ENLIVE, (ENSURE ENLIVE) LIQD Take 237 mLs by mouth 3 (three) times daily between meals. (Patient taking differently: Take 237 mLs by mouth 2 (two) times daily between meals.) 237 mL 12  . FEROSUL 325 (65 Fe) MG tablet Take 325 mg by mouth every morning.    . folic acid (FOLVITE) 1 MG tablet Take 1 mg by mouth daily.    Marland Kitchen guaiFENesin (MUCINEX) 600 MG 12 hr tablet Take 1 tablet (600 mg total) by mouth 2 (two) times daily. 30 tablet 0  . Isavuconazonium Sulfate (CRESEMBA) 186 MG CAPS Take 2 capsules (372 mg total) by mouth daily. 56 capsule 5  . levothyroxine (SYNTHROID) 88 MCG tablet Take 100 mcg by mouth daily.     . mirtazapine (REMERON SOL-TAB) 15 MG disintegrating tablet Take by mouth.    . EPINEPHrine 0.3 mg/0.3 mL IJ SOAJ injection Inject 0.3 mg into the muscle once for 1 dose. 1 each 11  . azithromycin (ZITHROMAX) 500 MG tablet Take 1 tablet by mouth once daily (Patient not taking: Reported on 11/30/2020) 30 tablet 5  . chlorpheniramine-HYDROcodone (TUSSIONEX PENNKINETIC ER) 10-8 MG/5ML SUER Take 5 mLs by mouth at bedtime as needed for cough. (Patient not taking: Reported  on 11/30/2020) 140 mL 0  . predniSONE (DELTASONE) 10 MG tablet Take 4 tablets (40 mg total) by mouth daily with breakfast for 3 days, THEN 3 tablets (30 mg total) daily with breakfast for 3 days, THEN 2 tablets (20 mg total) daily with breakfast for 3 days. Then continue on 93m daily.. 27 tablet 0   No facility-administered medications prior to visit.     Review of Systems:   Constitutional:   No  weight loss, night sweats,  Fevers, chills, + fatigue, or  lassitude.  HEENT:   No headaches,  Difficulty swallowing,  Tooth/dental problems, or  Sore throat,                No sneezing, itching, ear ache, nasal congestion, post nasal drip,   CV:  No  chest pain,  Orthopnea, PND, swelling in lower extremities, anasarca, dizziness, palpitations, syncope.   GI  No heartburn, indigestion, abdominal pain, nausea, vomiting, diarrhea, change in bowel habits, +low appetite, no bloody stools.   Resp:   No chest wall deformity  Skin: no rash or lesions.  GU: no dysuria, change in color of urine, no urgency or frequency.  No flank pain, no hematuria   MS:  No joint pain or swelling.  No decreased range of motion.  No back pain.    Physical Exam  BP 110/70 (BP Location: Left Arm, Patient Position: Sitting, Cuff Size: Normal)   Pulse (!) 107   Temp (!) 97.2 F (36.2 C) (Temporal)   Ht _0  (1.727 m)   Wt 118 lb (53.5 kg)   SpO2 91%   BMI 17.94 kg/m   GEN: A/Ox3; pleasant , NAD, thin cachectic   HEENT:  [place]/AT,   NOSE-clear, THROAT-clear, no lesions, no postnasal drip or exudate noted.   NECK:  Supple w/ fair ROM; no JVD; normal carotid impulses w/o bruits; no thyromegaly or nodules palpated; no lymphadenopathy.    RESP scattered rhonchi  no accessory muscle use, no dullness to percussion  CARD:  RRR, no m/r/g, no peripheral edema, pulses intact, no cyanosis or clubbing.  GI:   Soft & nt; nml bowel sounds; no organomegaly or masses detected.   Musco: Warm bil, no deformities or joint swelling noted.   Neuro: alert, no focal deficits noted.    Skin: Warm, no lesions or rashes    Lab Results:  CBC   BNP No results found for: BNP  ProBNP  Imaging: No results found.  Benralizumab SOSY 30 mg    Date Action Dose Route User   11/23/2020 1418 Given 30 mg Subcutaneous (Left Arm) TElton Sin LPN      PFT Results Latest Ref Rng & Units 11/30/2020 06/25/2017 08/06/2016  FVC-Pre L 2.09 2.52 2.84  FVC-Predicted Pre % 47 50 61  FVC-Post L 2.16 - 2.93  FVC-Predicted Post % 49 - 63  Pre FEV1/FVC % % 56 46 53  Post FEV1/FCV % % 56 - 56  FEV1-Pre L 1.17 1.16 1.50  FEV1-Predicted Pre % 33 28 39  FEV1-Post L 1.22 - 1.63   DLCO uncorrected ml/min/mmHg 15.81 - 18.56  DLCO UNC% % 53 - 59  DLCO corrected ml/min/mmHg 16.67 - 18.35  DLCO COR %Predicted % 56 - 59  DLVA Predicted % 123 - 108  TLC L - - 5.14  TLC % Predicted % - - 76  RV % Predicted % - - 129    No results found for: NITRICOXIDE      Assessment &  Plan:   ABPA (allergic bronchopulmonary aspergillosis) ([place]) Continue on current regimen for now with Fasenra and prednisone 10 mg daily. Need to monitor closely, as patient is now being worked up for possible hepatitis B.   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[place]) Continue on lifelong antifungal's with infectious disease  Asthmatic bronchitis , chronic ([place]) Continue on current regimen.  Patient has high symptom burden and daily mucus production.  We'll continue on current regimen.  Add in vest therapy to help with mucociliary clearance.  Plan  Patient Instructions  Continue on BREZTRI 2 puffs Twice daily  , rinse after use.  Remain on Prednisone 76m daily  Flutter valve Three times a day  .  Please eat often,  High protein food and drinks.  Continue on Fasenra.  Continue with ID follow up.  Albuterol inhaler As needed  Wheezing/shortness of breath .  Albuterol neb every 6hrs as needed for wheezing /shortness of breath as needed.  Robitussin DM As needed  Cough/congestion  Begin VEST therapy Twice daily  .  Follow up with Dr. 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 MAC continue on current regimen continue follow with infectious disease Patient remains on flutter valve therapy mucolytic's bronchodilators and continues to have high symptom burden with daily cough with significant mucus production.  He has multiple pulmonary issues.  Needs additional mucociliary clearance.  We'll add vest therapy.  Plan  Patient Instructions  Continue on  BREZTRI 2 puffs Twice daily  , rinse after use.  Remain on Prednisone 152mdaily  Flutter valve Three times a day  .  Please eat often,  High protein food and drinks.  Continue on Fasenra.  Continue with ID follow up.  Albuterol inhaler As needed  Wheezing/shortness of breath .  Albuterol neb every 6hrs as needed for wheezing /shortness of breath as needed.  Robitussin DM As needed  Cough/congestion  Begin VEST therapy Twice daily  .  Follow up with Dr. RaChase Callerr Sokha Craker NP in 2 months and As needed   Please contact office for sooner follow up if symptoms do not improve or worsen or seek emergency care        Protein-calorie malnutrition, severe High-protein diet     TaRexene EdisonNP 11/30/2020

## 2020-11-30 NOTE — Assessment & Plan Note (Signed)
Continue on current regimen for now with Fasenra and prednisone 10 mg daily. Need to monitor closely, as patient is now being worked up for possible hepatitis B.

## 2020-11-30 NOTE — Addendum Note (Signed)
Addended by: Elton Sin on: 11/30/2020 05:00 PM   Modules accepted: Orders

## 2020-11-30 NOTE — Assessment & Plan Note (Signed)
High-protein diet

## 2020-11-30 NOTE — Progress Notes (Signed)
Spirometry pre and post/DLCO performed today.

## 2020-11-30 NOTE — Addendum Note (Signed)
Addended by: Vanessa Barbara on: 11/30/2020 01:28 PM   Modules accepted: Orders

## 2020-11-30 NOTE — Progress Notes (Signed)
Patient presented to the office today for first-time Xolair injection.  Primary Pulmonologist: Brand Males MD Medication name: Xolair Strength: 361m Site(s): 3033mleft arm 7561might arm  Epi pen/Auvi-Q visible during appointment: Yes  Time of injection: 1430  Patient evaluated every 15-20 minutes per protocol x2 hours.  1st check: 1445 Evaluation: Patient sitting quietly in chair.  Denies any side or adverse effects.  2nd check: 1500  Evaluation:Patient sitting quietly in chair.  Patient denies any problems.  3rd check: 1515  Evaluation: Patient sitting quietly.  Patient denies any problems.  4th check: 1530   Evaluation:Patient sitting quietly in chair.  Patient denies any problems.  5th check: 1545  Evaluation: Patient sitting quietly.  Patient denies any problems.  6th check: 1600  Evaluation: Patient sitting quietly. Patient denies any problems.  7th check: 1615  Evaluation: Patient sitting quietly. Patient denies problems.  8th check: 1630  Evaluation:Patient denies any side or adverse effects.  No redness or swelling at any right or left injections sites.   Reviewed Epipen how and when to use with Patient.  Reviewed signs and symptoms of adverse reactions, and when to seek emergency care. Patient scheduled next Xolair injection.

## 2020-12-02 ENCOUNTER — Other Ambulatory Visit: Payer: Self-pay | Admitting: *Deleted

## 2020-12-02 MED ORDER — OMALIZUMAB 150 MG/ML ~~LOC~~ SOSY: 300.0000 mg | PREFILLED_SYRINGE | SUBCUTANEOUS | 11 refills | Status: DC

## 2020-12-02 MED ORDER — OMALIZUMAB 75 MG/0.5ML ~~LOC~~ SOSY: 75.0000 mg | PREFILLED_SYRINGE | SUBCUTANEOUS | 11 refills | Status: DC

## 2020-12-06 ENCOUNTER — Telehealth: Payer: Self-pay | Admitting: Adult Health

## 2020-12-06 NOTE — Telephone Encounter (Signed)
Received vest order, but the OV note doesn't discuss bronchiectasis. ABPA and Aspergilloma are on office visit but pt's insurance requires bronchiectasis to be discussed in the office note to cover home vest therapy.   Melissa spoke with radiologist to addend 12/04/20 CT angio to state:  ADDENDUM: Within the regions of more dense architectural distortion and scarring towards the lung apices, there is associated chronic appearing airways thickening and cylindrical bronchiectatic changes.  Melissa wants to know if TP is willing to addend 1/12 ov note to include the dx of bronchiectasis and how patient will benefit from vibratory vest therapy.    TP please advise, thanks!

## 2020-12-07 ENCOUNTER — Ambulatory Visit: Payer: BC Managed Care – PPO

## 2020-12-09 MED FILL — XOLAIR 75 MG/0.5ML SOSY: 75 | 28 days supply | Qty: 1 | Fill #0

## 2020-12-09 MED FILL — XOLAIR 150 MG/ML SOSY: 150 | 28 days supply | Qty: 4 | Fill #0

## 2020-12-12 ENCOUNTER — Telehealth: Payer: Self-pay | Admitting: Internal Medicine

## 2020-12-12 ENCOUNTER — Telehealth: Payer: Self-pay | Admitting: Adult Health

## 2020-12-12 DIAGNOSIS — J479 Bronchiectasis, uncomplicated: Secondary | ICD-10-CM | POA: Insufficient documentation

## 2020-12-12 NOTE — Assessment & Plan Note (Signed)
Bronchiectasis -Previous CT chest Within the regions of more dense architectural distortion and scarring towards the lung apices, there is associated chronic appearing airways thickening and cylindrical bronchiectatic changes. Complicated by ABPA , MAC and Aspergilloma  Currently on flutter valve, neb, robitussin and breztri . Continues to have significant cough with thick mucus.  Need to add VEST therapy,   Plan  Patient Instructions  Continue on BREZTRI 2 puffs Twice daily  , rinse after use.  Remain on Prednisone 38m daily  Flutter valve Three times a day  .  Please eat often,  High protein food and drinks.  Continue on Fasenra.  Continue with ID follow up.  Albuterol inhaler As needed  Wheezing/shortness of breath .  Albuterol neb every 6hrs as needed for wheezing /shortness of breath as needed.  Robitussin DM As needed  Cough/congestion  Begin VEST therapy Twice daily  .  Follow up with Dr. RChase Calleror Cleotilde Spadaccini NP in 2 months and As needed   Please contact office for sooner follow up if symptoms do not improve or worsen or seek emergency care

## 2020-12-12 NOTE — Telephone Encounter (Signed)
Done

## 2020-12-12 NOTE — Telephone Encounter (Signed)
Spoke with [name] and notified that her request to update note has been done. Nothing further needed.

## 2020-12-12 NOTE — Telephone Encounter (Signed)
Xolair Prefilled Syringe Received:  134m Prefilled Syringe >> quantity #4, lot #[id number], exp date 07/19/2021 798mPrefilled Syringe >> quantity #n/a Medication arrival date: 12/12/20 Received by: Acxel Dingee,LPN  Missing 2- Xolair 7542mrefilled syringes  Called [facility] to follow up on Xolair 77m56molair 77mg96ml be shipped to office 12/13/20.

## 2020-12-13 NOTE — Telephone Encounter (Signed)
Xolair Prefilled Syringe Received:  133m Prefilled Syringe >> quantity #n/a 714mPrefilled Syringe >> quantity #2, lot # [id number] date 10/18/2021 Medication arrival date: 12/13/20 Received by: Vaishali Baise,LPN

## 2020-12-13 NOTE — Telephone Encounter (Signed)
Will forward to [facility] to look out for form.

## 2020-12-14 ENCOUNTER — Ambulatory Visit (INDEPENDENT_AMBULATORY_CARE_PROVIDER_SITE_OTHER): Payer: BC Managed Care – PPO

## 2020-12-14 ENCOUNTER — Other Ambulatory Visit: Payer: Self-pay

## 2020-12-14 DIAGNOSIS — J4550 Severe persistent asthma, uncomplicated: Secondary | ICD-10-CM

## 2020-12-14 DIAGNOSIS — E43 Unspecified severe protein-calorie malnutrition: Secondary | ICD-10-CM | POA: Diagnosis not present

## 2020-12-14 DIAGNOSIS — R634 Abnormal weight loss: Secondary | ICD-10-CM | POA: Diagnosis not present

## 2020-12-14 DIAGNOSIS — D649 Anemia, unspecified: Secondary | ICD-10-CM | POA: Diagnosis not present

## 2020-12-14 DIAGNOSIS — F329 Major depressive disorder, single episode, unspecified: Secondary | ICD-10-CM | POA: Diagnosis not present

## 2020-12-14 MED ORDER — OMALIZUMAB 150 MG/ML ~~LOC~~ SOSY
300.0000 mg | PREFILLED_SYRINGE | Freq: Once | SUBCUTANEOUS | Status: AC
Start: 2020-12-14 — End: 2020-12-14
  Administered 2020-12-14: 300 mg via SUBCUTANEOUS

## 2020-12-14 MED ORDER — OMALIZUMAB 75 MG/0.5ML ~~LOC~~ SOSY
75.0000 mg | PREFILLED_SYRINGE | Freq: Once | SUBCUTANEOUS | Status: AC
  Administered 2020-12-14: 75 mg via SUBCUTANEOUS

## 2020-12-14 NOTE — Telephone Encounter (Signed)
Form received from [facility]), on Tammy's desk for her signature.

## 2020-12-14 NOTE — Progress Notes (Signed)
Have you been hospitalized within the last 10 days?  No Do you have a fever?  No Do you have a cough?  No Do you have a headache or sore throat? No Do you have your Epi Pen visible and is it within date?  Yes   Second Xolair injection.  Patient monitored for 30 minutes.  Patient denies any side or adverse effects. No redness or swelling at right or left injection sites.

## 2020-12-15 ENCOUNTER — Other Ambulatory Visit: Payer: Self-pay | Admitting: Infectious Disease

## 2020-12-15 DIAGNOSIS — B449 Aspergillosis, unspecified: Secondary | ICD-10-CM

## 2020-12-15 DIAGNOSIS — A310 Pulmonary mycobacterial infection: Secondary | ICD-10-CM

## 2020-12-15 MED FILL — CRESEMBA 186 MG CAPSULE: 186 | 28 days supply | Qty: 56 | Fill #0

## 2020-12-17 LAB — AFB CULTURE WITH SMEAR (NOT AT ARMC)
Acid Fast Culture: NEGATIVE
Acid Fast Smear: NEGATIVE

## 2020-12-19 ENCOUNTER — Other Ambulatory Visit: Payer: Self-pay | Admitting: Adult Health

## 2020-12-21 DIAGNOSIS — D509 Iron deficiency anemia, unspecified: Secondary | ICD-10-CM | POA: Diagnosis not present

## 2020-12-21 DIAGNOSIS — B181 Chronic viral hepatitis B without delta-agent: Secondary | ICD-10-CM | POA: Diagnosis not present

## 2020-12-21 DIAGNOSIS — R195 Other fecal abnormalities: Secondary | ICD-10-CM | POA: Diagnosis not present

## 2020-12-21 DIAGNOSIS — Z7952 Long term (current) use of systemic steroids: Secondary | ICD-10-CM | POA: Diagnosis not present

## 2020-12-21 NOTE — Telephone Encounter (Signed)
Form signed and returned to [facility] on 12/20/20.  Nothing further needed.

## 2020-12-28 ENCOUNTER — Ambulatory Visit: Payer: BC Managed Care – PPO | Attending: Internal Medicine

## 2020-12-28 ENCOUNTER — Other Ambulatory Visit: Payer: Self-pay

## 2020-12-28 ENCOUNTER — Other Ambulatory Visit (HOSPITAL_COMMUNITY): Payer: Self-pay | Admitting: Internal Medicine

## 2020-12-28 ENCOUNTER — Ambulatory Visit: Payer: BC Managed Care – PPO | Admitting: Infectious Disease

## 2020-12-28 ENCOUNTER — Ambulatory Visit (INDEPENDENT_AMBULATORY_CARE_PROVIDER_SITE_OTHER): Payer: BC Managed Care – PPO

## 2020-12-28 ENCOUNTER — Encounter: Payer: Self-pay | Admitting: Infectious Disease

## 2020-12-28 DIAGNOSIS — B181 Chronic viral hepatitis B without delta-agent: Secondary | ICD-10-CM | POA: Diagnosis not present

## 2020-12-28 DIAGNOSIS — A310 Pulmonary mycobacterial infection: Secondary | ICD-10-CM | POA: Diagnosis not present

## 2020-12-28 DIAGNOSIS — J4550 Severe persistent asthma, uncomplicated: Secondary | ICD-10-CM

## 2020-12-28 DIAGNOSIS — B440 Invasive pulmonary aspergillosis: Secondary | ICD-10-CM | POA: Diagnosis not present

## 2020-12-28 DIAGNOSIS — Z7185 Encounter for immunization safety counseling: Secondary | ICD-10-CM | POA: Insufficient documentation

## 2020-12-28 DIAGNOSIS — Z23 Encounter for immunization: Secondary | ICD-10-CM

## 2020-12-28 DIAGNOSIS — B449 Aspergillosis, unspecified: Secondary | ICD-10-CM

## 2020-12-28 HISTORY — DX: Encounter for immunization safety counseling: Z71.85

## 2020-12-28 MED ORDER — OMALIZUMAB 75 MG/0.5ML ~~LOC~~ SOSY
75.0000 mg | PREFILLED_SYRINGE | Freq: Once | SUBCUTANEOUS | Status: AC
  Administered 2020-12-28: 75 mg via SUBCUTANEOUS

## 2020-12-28 MED ORDER — OMALIZUMAB 150 MG/ML ~~LOC~~ SOSY
300.0000 mg | PREFILLED_SYRINGE | Freq: Once | SUBCUTANEOUS | Status: AC
  Administered 2020-12-28: 300 mg via SUBCUTANEOUS

## 2020-12-28 NOTE — Progress Notes (Signed)
   Covid-19 Vaccination Clinic  Name:  [name]    MRN: [id number] DOB: 1976/04/17  12/28/2020  [name] was observed post Covid-19 immunization for 15 minutes without incident. He was provided with Vaccine Information Sheet and instruction to access the V-Safe system.   [name] was instructed to call 911 with any severe reactions post vaccine: Marland Kitchen Difficulty breathing  . Swelling of face and throat  . A fast heartbeat  . A bad rash all over body  . Dizziness and weakness   Immunizations Administered    Name Date Dose VIS Date Route   PFIZER Comrnaty(Gray TOP) Covid-19 Vaccine 12/28/2020 11:41 AM 0.3 mL 10/27/2020 Intramuscular   Manufacturer: [place]   Lot: [id number]   NDC: [phone]

## 2020-12-28 NOTE — Progress Notes (Signed)
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   MRN: [id number]  HPI  45 year-old Asian with history of Mycobacterium avium infection also with history history of aspergilloma status post resection by cardiothoracic surgery then found to have what appears to be allergic bronchopulmonary aspergillosis Spring 2012 and restarted on voriconazole and steroids, then found to have apparent new aspergilloma. We had taken him off his Mycobacterium avium drugs and then he developed fevers chills and malaise  Spring (2013) and was seen by my partner Dr. Megan Farmer in late April  His AFB smears and cultures obtained in the clinic on that day prior to the initiation of azithromycin ethambutol ultimately failed to grow Mycobacterium avium. He had been doing well I saw him in [place] 2013 we discontinued his azithromycin and ethambutol again. Unfortunately he again shortly thereafter developed fevers cough malaise. He started back on azithromycin ethambutol. Cultures done for AFB here were again negative. The patient however felt dramatically better having been back on azithromycin and ethambutol.   I saw him in January of 2015 when he was feeling relatively well.  He did have an episode of coughing up a few flecks of blood in January 2016 which prompted appt with [facility] but by time he made appt this had long since resolved. Was only a one time episode none since then.  I saw again in March and then in September 2016. In summer he was hospitalized with hemoptysis and found to have an area in the upper lobe consistent with an aspergilloma. He underwent bronchoscopy with BAL and cultures ultimately yielded a Saccharomyces species on fungal culture. His serum galactomannan was negative.  He had been  on voriconazole and axial has had radiographic  improvement and his x-ray findings. He also feelt etter symptomatically with no more hemoptysis improvement in his cough improvement in his energy though he still feels not nearly as well as he did prior to his hospitalization. He is avoiding exercised during the winter as it often precipitates coughing spells. He was  only taking 200 mg twice a day of voriconazole rather than the 300 mg he has continued this along with his ethambutol and azithromycin. He continued to have cough more so with exercise and has not been exercising at "[employer]" due to the fact this elicits more coughing. He has not smoked since 2007. He is seeing Dr. Chase Farmer and is being considered for new drug infusion to treat his COPD.  He did have 20# weight loss that he attributed to increased exercise.  He then saw  Dr. Chase Farmer after an episode of hematemesis. 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 He said he only coughed up a small amount of blood material and did not go to the ER but was seen and followed by pulmonary. Had a chest x-ray which showed no new findings.  He was then  going to be evaluated by the transplant team at [facility].  He did complain of hyperpigmented spots on sun exposed areas on his body including arms and neck which his dermatologist have told him is due to his voriconazole use.   We considered change at that time  Since that time he has [name] been admitted with hemoptysis at [facility].   Repeat CT lungs showed on 08/09/17:  Large emphysematous bulla are  noted in both upper lobes. There is interval development of fluid density in several bulla in the left upper lobe concerning for superimposed infection, possibly fungal in origin.  Stable bilateral calcified pulmonary nodules are noted consistent with prior granulomatous disease.  Stable tree in bud appearance is noted medially in the left lung posteriorly consistent with sequela of previous atypical  infection.   Due to his skin toxicity we decided to change him to [facility] and he is on that now .  And had done well.  He had problems with bilateral pneumothoraces.  This required placement of an intrabronchial valve in November 2019.  This resolved but then he had to be taken back to the operating room in early January where the intrabronchial valves were removed.  Was seen at [facility] medical transplant team as well but felt not yet to be a transplant candidate due to the fact that his lungs were not in severe enough condition to warrant transplantation  He had applied for disability but this has not yet gone through.  Certainly he deserves to have disability and he has significant medical problems.  He did however eventually go back to work at [employer]  He is been receiving his antimycobacterial drugs and is [place].  I last saw cans he was hospitalized in July for what he says was pneumonia.  The CT scan was read as showing:  " Chronic narrowing and occlusion of several right upper lobe segmental subsegmental pulmonary arteries and filling defects in the left upper lobe segmental and subsegmental branches which were within the regions of bullous disease which are suggestive of chronic embolism/occlusion.  It showed extensive bullous and paraseptal predominant emphysematous changes with thick-walled cystic bullae toward the apices with nodularity and air layering air-fluid levels within the left apical bulla consistent with aspergilloma.  He had mixed areas of groundglass and consolidative opacity left lower lobe and right lower lobe periphery.  Nares note indicates that he was not anticoagulated due to the fact that he has recurrent hemoptysis per the patient himself was unaware that he was found to have anything suggestive clots on scan and believes he was only treated for pneumonia.  Since I last saw [name] he was treated with Augmentin and corticosteroids for flare of  bronchitis.  He improved on both steroids and antibiotics but within 3 to 5 days after coming off the Augmentin had worsening cough with productive sputum particularly in the morning some with some blood streaking.  He had seen pulmonary who had recommended him come see [place] again and I saw him in November  He hadnot had fevers chills or other systemic symptoms.  He was apprehensive that he could be harboring resistant organisms.Out of his lungs.  Get I think there is certainly possibility that his mycobacteria could have developed some resistance and/or that he has a superimposed bacterial infection yet again.  At the time we switched out his azithromycin for levofloxacin.  His cough may have improved a bit in terms of his severity.  He says about 10 out of 10 in severity when he saw [place] last versus a 6-7.  Sputum is changed slightly now with a red tinge to it.  He was also seen by hematology oncology to work-up anemia at [facility] medical group.  In the context of his weight loss they also checked him for viral hepatitis panel and [place]z hepatitis B surface antigen was positive along with a hepatitis B DNA though at a low value  value of 2050 copies  Since seen Dr. Chase Farmer and is starting Xolair in addition to the [place] he is already taking along with systemic prednisone (in midst of a taper)  We checked for delta agent this was negative.  We checked hepatitis B E antigen antibody which was positive.  We did an ultrasound screening for hepatocellular carcinoma and this was negative for any lesions.  He is also seen GI and hepatology at [facility].  There are plans for both an upper endoscopy and lower endoscopy to evaluate his anemia.  He continues to have trouble with coughing fits.  He is going to be fitted with a percussive vest by pulmonary.  He is not able to do much of the exercises to get phlegm out of his lungs due to poor exercise tolerance.      Past Medical  History:  Diagnosis Date  . Anemia   . 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[place])   . Chronic viral hepatitis B without delta-agent ([place]) 11/23/2020  . Drug rash 12/16/2017  . Dyspnea   . Esophageal reflux    very rare  . 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 Lung disease, bullous ([place])   . MAI (mycobacterium avium-intracellulare) ([place])   . Mold exposure 12/05/2015  . 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 Solar lentigo 08/08/2015  . Weight loss 10/15/2016   Past Surgical History:  Procedure Laterality Date  . CHEST TUBE INSERTION Right 09/29/2018   Procedure: CHEST TUBE REPLACEMENT;  Surgeon: Melrose Nakayama, MD;  Location: [place];  Service: Thoracic;  Laterality: Right;  . INSERTION OF IBV VALVE N/A 09/19/2018   Procedure: INSERTION OF INTERBRONCHIAL VALVE (IBV);  Surgeon: Melrose Nakayama, MD;  Location: [facility] OR;  Service: Thoracic;  Laterality: N/A;  . INSERTION OF IBV VALVE N/A 12/01/2018   Procedure: REMOVAL OF INTERBRONCHIAL VALVE (IBV);  Surgeon: Melrose Nakayama, MD;  Location: [facility] OR;  Service: Thoracic;  Laterality: N/A;  . left VATS  2012   thoracotomy and LUL apical posterior segmentectomy  . STAPLING OF BLEBS Right 09/12/2018   Procedure: STAPLING OF BLEBS, right lower and middle lung lobes;  Surgeon: Melrose Nakayama, MD;  Location: [place];  Service: Thoracic;  Laterality: Right;  Marland Kitchen VIDEO ASSISTED THORACOSCOPY Right 09/12/2018   Procedure: VIDEO ASSISTED THORACOSCOPY, right lung;  Surgeon: Melrose Nakayama, MD;  Location: [place];  Service: Thoracic;  Laterality: Right;  Marland Kitchen VIDEO BRONCHOSCOPY Bilateral 10/20/2015   Procedure: VIDEO BRONCHOSCOPY WITHOUT FLUORO;  Surgeon: Marshell Garfinkel, MD;  Location: [place];  Service: Cardiopulmonary;  Laterality: Bilateral;  . VIDEO BRONCHOSCOPY N/A 09/19/2018   Procedure: VIDEO BRONCHOSCOPY;  Surgeon: Melrose Nakayama, MD;  Location[facility] OR;  Service: Thoracic;  Laterality: N/A;  . VIDEO BRONCHOSCOPY N/A  12/01/2018   Procedure: VIDEO BRONCHOSCOPY;  Surgeon: Melrose Nakayama, MD;  Location: [place];  Service: Thoracic;  Laterality: N/A;  . VIDEO BRONCHOSCOPY WITH INSERTION OF INTERBRONCHIAL VALVE (IBV) N/A 09/29/2018   Procedure: VIDEO BRONCHOSCOPY WITH INSERTION OF INTERBRONCHIAL VALVE  (IBV);  Surgeon: Melrose Nakayama, MD;  Location: [facility] OR;  Service: Thoracic;  Laterality: N/A;   Family History  Adopted: Yes   Social History   Tobacco Use  . Smoking status: Former Smoker    Packs/day: 0.70    Years: 19.00    Pack years: 13.30    Types: Cigarettes    Quit date: 11/20/2007    Years since quitting: 13.1  . Smokeless tobacco: Former Systems developer  Types: Snuff    Quit date: 11/19/1994  Vaping Use  . Vaping Use: Never used  Substance Use Topics  . Alcohol use: No  . Drug use: No    Allergies  Allergen Reactions  . Clarithromycin Other (See Comments)    Adverse reaction w/ voriconazole UNSPECIFIED REACTION   . Rifaximin Other (See Comments)    Adverse reaction w/ voriconazole UNSPECIFIED REACTION     Current Outpatient Medications:  .  albuterol (PROVENTIL) (2.5 MG/3ML) 0.083% nebulizer solution, Take 3 mLs (2.5 mg total) by nebulization every 6 (six) hours as needed for wheezing or shortness of breath., Disp: 75 mL, Rfl: 12 .  albuterol (VENTOLIN HFA) 108 (90 Base) MCG/ACT inhaler, Inhale 2 puffs into the lungs every 6 (six) hours as needed., Disp: 18 g, Rfl: 2 .  BREZTRI AEROSPHERE 160-9-4.8 MCG/ACT AERO, INHALE 2 PUFFS TWICE DAILY, Disp: 11 g, Rfl: 1 .  Calcium Carbonate-Vitamin D 600-400 MG-UNIT tablet, Take 1 tablet by mouth daily. , Disp: , Rfl:  .  CRESEMBA 186 MG CAPS, TAKE 2 CAPSULES BY MOUTH DAILY, Disp: 56 capsule, Rfl: 5 .  EPINEPHrine 0.3 mg/0.3 mL IJ SOAJ injection, Inject 0.3 mg into the muscle once for 1 dose., Disp: 1 each, Rfl: 11 .  ethambutol (MYAMBUTOL) 400 MG tablet, TAKE 2 AND 1/2 TABLETS BY MOUTH ONCE DAILY, Disp: 598 tablet, Rfl: 5 .  feeding supplement,  ENSURE ENLIVE, (ENSURE ENLIVE) LIQD, Take 237 mLs by mouth 3 (three) times daily between meals. (Patient taking differently: Take 237 mLs by mouth 2 (two) times daily between meals.), Disp: 237 mL, Rfl: 12 .  FEROSUL 325 (65 Fe) MG tablet, Take 325 mg by mouth every morning., Disp: , Rfl:  .  folic acid (FOLVITE) 1 MG tablet, Take 1 mg by mouth daily., Disp: , Rfl:  .  guaiFENesin (MUCINEX) 600 MG 12 hr tablet, Take 1 tablet (600 mg total) by mouth 2 (two) times daily., Disp: 30 tablet, Rfl: 0 .  levothyroxine (SYNTHROID) 88 MCG tablet, Take 100 mcg by mouth daily. , Disp: , Rfl:  .  mirtazapine (REMERON SOL-TAB) 15 MG disintegrating tablet, Take by mouth., Disp: , Rfl:  .  omalizumab (XOLAIR) 150 MG/ML prefilled syringe, Inject 300 mg into the skin every 14 (fourteen) days., Disp: 4 mL, Rfl: 11 .  omalizumab (XOLAIR) 75 MG/0.5ML prefilled syringe, Inject 75 mg into the skin every 14 (fourteen) days., Disp: 1 mL, Rfl: 11    Review of Systems  Constitutional: Positive for fatigue and unexpected weight change. Negative for activity change, appetite change, chills, diaphoresis and fever.  HENT: Negative for congestion, rhinorrhea, sinus pressure, sneezing, sore throat and trouble swallowing.   Eyes: Negative for photophobia and visual disturbance.  Respiratory: Positive for cough and shortness of breath. Negative for apnea, chest tightness, wheezing and stridor.   Cardiovascular: Negative for chest pain, palpitations and leg swelling.  Gastrointestinal: Negative for abdominal pain, anal bleeding, blood in stool, constipation, diarrhea, nausea and vomiting.  Genitourinary: Negative for difficulty urinating, dysuria, flank pain and hematuria.  Musculoskeletal: Negative for arthralgias, back pain, gait problem, joint swelling and myalgias.  Skin: Negative for color change, pallor and wound.  Neurological: Negative for dizziness, tremors, weakness and light-headedness.  Hematological: Negative for  adenopathy. Does not bruise/bleed easily.  Psychiatric/Behavioral: Positive for dysphoric mood. Negative for agitation, behavioral problems, confusion, decreased concentration and sleep disturbance.       Objective:   Physical Exam Constitutional:      General: He is  not in acute distress.    Appearance: He is not diaphoretic.  HENT:     Head: Normocephalic and atraumatic.  Eyes:     General: No scleral icterus.    Conjunctiva/sclera: Conjunctivae normal.  Cardiovascular:     Rate and Rhythm: Normal rate and regular rhythm.     Heart sounds: Normal heart sounds. No murmur heard. No friction rub. No gallop.   Pulmonary:     Effort: Prolonged expiration present. No respiratory distress.     Breath sounds: Examination of the right-lower field reveals decreased breath sounds. Decreased breath sounds and rhonchi present.  Abdominal:     General: There is no distension.     Palpations: Abdomen is soft.     Tenderness: There is no abdominal tenderness. There is no rebound.  Musculoskeletal:        General: No tenderness.     Cervical back: Normal range of motion and neck supple.  Lymphadenopathy:     Cervical: No cervical adenopathy.  Skin:    General: Skin is warm and dry.     Coloration: Skin is not pale.     Findings: No erythema.       Neurological:     General: No focal deficit present.     Mental Status: He is alert and oriented to person, place, and time.     Sensory: Sensory deficit:      Motor: No abnormal muscle tone.     Coordination: Coordination normal.  Psychiatric:        Mood and Affect: Mood normal.        Behavior: Behavior normal.        Thought Content: Thought content normal.        Judgment: Judgment normal.          Assessment & Plan:  Chronic hepatitis B without hepatic,:  He does not meet criteria for treatment. Will screen him for [facility] bi-annually  I expect he probably acquired hepatitis B perinatally.  Aspergilloma: continue lifelong  antifungal therapy: currently on CRESEMBA.  Fungal cultures are coming from sputum that was sent  Allergic bronchopulmonary aspgergillosis:   I would be more comfortable with thim being on long standing antifungal therapy.   Is now also starting Xolair in addition to [employer] exposed rash from voriconozole: We changed to Cresemba and it seems improved   Mycobacterium avium pulmonary infection:  I am concerned that IF he has developed long term resistance to an antimicrobial that M avium would be the more likely organism to have developed this  I also am worried re his pulse Ox having gone to 80s and wonder if he needs home O2 oxygen if not now int he near future  ?  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   Anemia: likely multifactorial being worked up at [facility] and going to have EGD and colonoscopy hopefully without issues with sedation since I would hate for him to end up being on the ventilator again  Vaccine counselling: he needs 4th Pfizer dose being immunocompromised and with significant lung disease. He will go to [facility] for 4th dose

## 2020-12-28 NOTE — Progress Notes (Signed)
Have you been hospitalized within the last 10 days?  No Do you have a fever?  No Do you have a cough?  No Do you have a headache or sore throat? No Do you have your Epi Pen visible and is it within date?  Yes   Patient monitored 30 minutes after third Xolair  injection appointment.  No signs of side or adverse effects.

## 2020-12-30 ENCOUNTER — Encounter: Payer: Self-pay | Admitting: Internal Medicine

## 2020-12-30 ENCOUNTER — Ambulatory Visit: Payer: BC Managed Care – PPO | Admitting: Internal Medicine

## 2020-12-30 ENCOUNTER — Other Ambulatory Visit: Payer: Self-pay

## 2020-12-30 ENCOUNTER — Ambulatory Visit: Payer: BC Managed Care – PPO | Admitting: Adult Health

## 2020-12-30 VITALS — BP 118/76 | HR 74 | Temp 97.6°F | Ht 68.0 in | Wt 120.4 lb

## 2020-12-30 DIAGNOSIS — A310 Pulmonary mycobacterial infection: Secondary | ICD-10-CM

## 2020-12-30 DIAGNOSIS — R0902 Hypoxemia: Secondary | ICD-10-CM | POA: Diagnosis not present

## 2020-12-30 DIAGNOSIS — B449 Aspergillosis, unspecified: Secondary | ICD-10-CM | POA: Diagnosis not present

## 2020-12-30 DIAGNOSIS — R768 Other specified abnormal immunological findings in serum: Secondary | ICD-10-CM | POA: Diagnosis not present

## 2020-12-30 DIAGNOSIS — A312 Disseminated mycobacterium avium-intracellulare complex (DMAC): Secondary | ICD-10-CM | POA: Diagnosis not present

## 2020-12-30 DIAGNOSIS — B4481 Allergic bronchopulmonary aspergillosis: Secondary | ICD-10-CM

## 2020-12-30 DIAGNOSIS — J8283 Eosinophilic asthma: Secondary | ICD-10-CM

## 2020-12-30 MED ORDER — PREDNISONE 10 MG PO TABS: ORAL_TABLET | ORAL | 0 refills | Status: AC

## 2020-12-30 NOTE — Patient Instructions (Addendum)
ICD-10-CM   1. Exercise hypoxemia  R09.02   2. ABPA (allergic bronchopulmonary aspergillosis) ([place])  B44.81   3. 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[place])  B44.9   4. 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 Mycobacterium avium complex ([place])  A31.0     You are having progressive worsening of lung function Hopefully the Xolair kicks in You now desaturating with exertion after climbing 1 flight of stairs to 85%  Plan -Start exertional portable oxygen 2 L nasal cannula -Test overnight oxygen study room air at night - Please take Take prednisone 55m once daily x 4 days, then 314monce daily x 4 days, then 2062mnce daily x 4  days, then prednisone 34m24mce daily  baseline dose continuye  - flutter valve, vest Rx  Followup - March 2022 with RamaChase CallerTammy Parrett

## 2020-12-30 NOTE — Progress Notes (Signed)
male never smoker followed for ABPA , MAC and Aspergilloma .  Patient has a history of aspergilloma , status post VATS in 2012. 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 Followed at [facility] for MAC .    TEST  1.7/13 Ig E 800s 12/10/11 Sputum AFB  Smear and culture negative (prelim) 12/10/11 Fungal sputum culture - negative (final) 01/30/2012 spirometry - fev1 1.7L/41%, ratip 52 - BEST EVER 01/30/2012 walking desaturation test: 185 feet x 3 laps: did not deaturate 05/2014 >Spiro fev1 1.7L/40%, ratio 55  Spirometry 06/25/2017 showed FEV1 at 28%, ratio 46, FVC 50%.  Events  2010 Dx with MAI >Azithro/Ethambutol 2012 Dx with Aspergilloma >VATS >started on Voriconazaol  2012 Dx w/ ABPA w/ High IgE >4000>started on prednisone  2013 tried off azithr/Etham but developed fever, Cx neg but restarted on rx.  2016 Hospitalzed with hemoptysis >IR guided embolization . FOB/BAL + Saccharomyces on fungal fx . Serum Galactomannan was neg.  [name] evaluation 04/2017 for Transplant -rejected. Felt to early for transplant - recommended pre-transplant weight goal 128 lbs for a BMI > 19          HPI   OV 01/16/2016  Chief Complaint  Patient presents with  . Follow-up    Pt denies any increased dyspnea at this time - states that it comes and goes with activity. Pt states it is different on a daily basis. Denies hemoptysis since last OV.   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 Since then he overall feels that his dyspnea is not back to baseline. He feels he has had a new lower baseline. He has more fatigued than usual. But he is stable without any exacerbation. He feels in the next few years ago started to become permanently disabled. He currently works as a cBiomedical scientist There are no fevers.   Last chest x-ray 11/07/2015: Shows hyperinflation personally  visualized Last IgE January 2013 was 841 and significantly improved from 4000 2012.  Last CBC January 2017 with normal used to flows are 100 cells per cubic millimeter.   OV 08/27/2016  Chief Complaint  Patient presents with  . Follow-up    Pt states his cough and SOB is at baseline. Pt states he has hemoptysis around once a week at most - penny size of mucus. Pt denies CP/tightness and f/c/s.      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 He status post IR guided embolization of hemoptysis end 2016.   Last visit feb 2017 he was reporting worsening dyspnea. We checked his IgE. Improved significantly but it was still in the 700s. We started Xolair therapy. He's been doing this now for a few months. He says this has helped his dyspnea symptoms significantly. However at end of 2 weeks he starts getting more symptomatic. Overall he is stable. 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 There are no fevers. Here, function test today FEV1 is 1.65 L/42% postbronchodilator. This is similar to 2014 but improved compared to 2015. Off note he tells me that he is mild hemoptysis once a month this is stable. He says this is actually been going on since November 2016 when he had his embolization. This no change in this. He knows that he has to monitor his hemoptysis volume. He  will have his flu shot today  Last chest x-ray 11/07/2015: Shows hyperinflation personally visualized Last IgE FEb 2017 was 717 and started xolair (January 2013 was 841 and significantly improved from 4000 in  2012)   OV 03/18/2017  Chief Complaint  Patient presents with  . Follow-up    6 mo f/u. Breathing has been the same since the last visit. Denies any increased SOB or chest pains.    Follow-up severe persistent asthma with ABPA and aspergilloma with MAI infection. He status post IR guided embolization for hemoptysis and end of  2016   Overall he has done well. However he says that at his job at [employer] they have been working hard and he is mostly fatigue with very closely. Recently been only sleeping 3 hours per day because of the work. Then in mid April 2018 he have one episode of hemoptysis while at work. With small amount that lasted few to several minutes. It did scare him but he decided to hold off against going to the emergency department. Since then he's not had any recurrence. He feels stressed and job stress is contributing to this. Most recent CT scan chest as in December 2017 that shows stability documented below. There are no other new issues. He is open to having a transplant referral.   OV 01/27/2018  Chief Complaint  Patient presents with  . Follow-up    Pt states he has been doing good. Denies any complaints or concerns.     Follow-up severe persistent asthma with ABPA on prednisone 4m daily, spiriva, breo and Aspergilloma with MAI infection. He status post IR guided embolization of hemoptysis end 2016 and on Xolair since early 2017   Overall doing well. 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 Symptoms score is low ; 12 CAT score. Compliant with Rx. No hemoptysis. Reviewed prior labs - he says on daily prednisone since 2012. And prior to that has had significant eosinophilia and also break through eos since then (see below). Unable to come off prednisone completely. XKathreen Devoidhas helped but not fully. 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 , MRN: [id number], ADDRESS: [address] [place] [id number]  12/17/2018 -   Chief Complaint  Patient presents with  . Follow-up    Pt states he was in the hosp Oct and Nov 2019. Pt states he is still not back to his self but is taking  one day at a time. Due to the recent hospitalization is why pt is here for today's appt.     Follow-up severe persistent asthma with ABPA on prednisone 514mdaily, spiriva, breo and Aspergilloma with MAI infection. He status post IR guided embolization of hemoptysis end 2016 and on Xolair since early 2017 bu tstopped since spring 2019 due to insurance issues    HPI KeCHIRON CAMPIONE89.o. -returns for follow-up.  I last saw him in the spring 2019.  After that in September 2019 he saw my nurse practitioner.  Then mid October 2019 he was admitted with acute right-sided tension pneumothorax [personally visualized the CT chest and confirmed the findings today].  After that he had a 5-week hospital stay.  This included failed chest tube management and pleurodesis.  Finally he required endobronchial  valves by Dr. Roxan Hockey.  These were also finally removed a few weeks ago.  He saw Dr. Roxan Hockey yesterday.  Chest x-ray follow-up yesterday that I visualized the lungs look expanded.  He is also seen [name] of infectious diseases yesterday and is been asked to continue his MAC and ABPA antifungal therapy.  I did notice that he is not taking Xolair anymore since the spring 2019.  He says is due to insurance issues.  Today he tells me that overall with all these admissions he has been left completely fatigued, lost weight and worn out.  He is now worried about his life expectancy.  At the same time he wants to go on work.  He wants to know if he should apply for disability.  We have had these conversations in the past.  We discussed his life philosophy.  He tells me that if he did not work he would feel that he is lazy and it is not compatible with this personal value system.  I did indicate to him that this is to be completely respected but his medical condition is that he would qualify for disability.  Therefore it is a personal choice he will have to make.  He is applied for Social Security disability through  the hospital social worker in November 2019.  The decision is pending any time now.  He is frustrated that is not able to work 80 hours a day.  He is wondering if he can even work 40 hours a day.  I have only advised him that he should look at complete disability.  If at all he wants to work he can look at a desk job.  Definitely should avoid jobs that heavy workload in the kitchen that involve carrying things or even sneezing or even doing pulmonary function test anymore because given his propensity for pneumothorax any rise in intrathoracic pressure would put him at risk for pneumothorax or other complications.  Other than this in terms of his lung health he is feeling stable at this point.  He is not taking Xolair he stopped this in the spring 2019.  This means is continued IgE destruction of the lungs.  We discussed this and he is willing to look into restarting this  Of note, he also wants me to check his blood work for [name].  I reviewed Dr. Arlyss Queen note.  This is indeed correct because yesterday pick medical record system was down.    OV 10/28/2019  Subjective:  Patient ID: DMARIO RUSSOM, male , DOB: November 30, 1975 , age 47 y.o. , MRN: [id number] , ADDRESS: [address] [place] [address]   10/28/2019 -   Chief Complaint  Patient presents with  . Follow-up    Pt states he has been doing okay since last visit. States he has good and bad days with his breathing and also has an occ cough.      Follow-up severe persistent asthma with ABPA on prednisone 34m daily, spiriva, breo and Aspergilloma with MAI infection. He status post IR guided embolization of hemoptysis end 2016 and on Xolair since early 2017 bu tstopped since spring 2019 due to insurance issues. FASENRA since April/May 2020  HPI KSONNIE PAWLOSKI89y.o. -routine follow-up last seen in January 2020.  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  He will not lift more than 15 pounds because of his pneumothorax risk and dyspnea.  He is happy now that he has employed based insurance.  He did apply for disability but did not qualify because he did not desaturate with a 6-minute walk test.  This is despite him having severe obstructive lung disease.  He says he is reconciled and he likes to work because it gives him purpose.  He has had his flu shot.  He is now taking [name] since spring 2020 and this seems to be working well for him.  He takes it every 8 weeks.  He has not had any exacerbations.  He prefers to avoid pulmonary function test because of the pneumothorax risk.  There are no other new issues.     CAT Symptom & Quality of Life Score (GSK trademark) 0 is no burden. 5 is highest burden 01/27/2018   Never Cough -> Cough all the time 3  No phlegm in chest -> Chest is full of phlegm 2  No chest tightness -> Chest feels very tight 1  No dyspnea for 1 flight stairs/hill -> Very dyspneic for 1 flight of stairs 3  No limitations for ADL at home -> Very limited with ADL at home 1  Confident leaving home -> Not at all confident leaving home 0  Sleep soundly -> Do not sleep soundly because of lung condition 1  Lots of Energy -> No energy at all 1  TOTAL Score (max 40)  12    Results for KAYSTON, JODOIN (MRN [id number]) as of 01/27/2018 09:12  Ref. Range 08/06/2016 09:09 06/25/2017 09:11  FEV1-Pre Latest Units: L 1.50 1.16  FEV1-%Pred-Pre Latest Units: % 39 28  Pre FEV1/FVC ratio Latest Units: % 53 46     Results for MUSTAPHA, COLSON (MRN [id number]) as of 01/27/2018 09:12  Ref. Range 12/16/2010 18:25 12/17/2010 03:45 12/17/2010 12:38 12/18/2010 04:25 12/19/2010 04:00 12/20/2010 04:04 12/21/2010 03:37 12/22/2010 06:42 12/22/2010 15:22 12/23/2010 04:22 12/24/2010 03:30 12/25/2010 04:19 12/27/2010 04:00 03/05/2011 18:15 03/06/2011 09:08 03/07/2011 09:18 03/09/2011 05:10 03/11/2011  04:54 04/02/2011 09:41 05/28/2011 09:42 11/26/2011 17:14 12/10/2011 10:21 12/10/2011 10:21 03/13/2012 16:54 09/22/2012 10:46 11/23/2013 09:46 07/05/2014 09:58 02/16/2015 10:16 08/08/2015 10:04 10/13/2015 04:00 10/13/2015 04:20 10/14/2015 02:28 10/15/2015 04:44 12/05/2015 12:16 01/16/2016 16:16 04/09/2016 09:43 08/09/2017 10:48 08/10/2017 10:46  Eosinophils Absolute Latest Ref Range: 0.0 - 0.7 K/uL 0.3 0.1  0.4          0.0 0.0 0.1   0.1 0.0 0.0    0.1 0.1 0.1 0.1 0.0 0.1   0.1 0.1 0.0 196 0.0 0.0   Results for MARL, SEAGO (MRN [id number]) as of 01/27/2018 09:12  Ref. Range 03/07/2011 09:18 03/08/2011 16:23 11/26/2011 17:14 01/16/2016 16:16 10/28/2019   IgE (Immunoglobulin E), Serum Latest Ref Range: <115 kU/L 4185.2 Result con... (H) 4744.6.Marland Kitchen. (H) 841.8 (H) 717 (H) 537      OV 06/09/2020  Subjective:  Patient ID: TYVION EDMONDSON, male , DOB: 07-30-1976 , age 45 y.o. , MRN: [id number] , ADDRESS: [address] [place] [address]   06/09/2020 -   Chief Complaint  Patient presents with  . Hospitalization Follow-up    recent PNA- feeling weak today and low energy. He is not coughing more than usual baseline at this time- last hemoptysis 06/07/20.      HPI DANIE DIEHL 45 y.o. -returns for follow-up.  He was admitted June 04, 2020 with hemoptysis over several weeks.  This is CT diagnosis of left lower lobe pneumonia according to the discharge notes.  He was discharged 1 day prior to this visit on June 15, 2020.  The CT report as below.  Currently he feels free of hemoptysis but just feels fatigued.  He still continues to work.  He is not interested in disability.  He is taking Spiriva and Breo.  He is taken a few days of work because of the post admission fatigue.  Of note his CT chest shows evidence of right heart strain.  He did not have an echocardiogram yet   IMPRESSION: 1. 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 Additional filling defects  present in the left upper lobe segmental and subsegmental branches as well. These are both within regions of extensive bullous disease, architectural distortion and surgical change and the peripherally marginated appearance is most suggestive of chronic embolus/occlusion. 2. While the RV/LV ratio is increased (1.15) the intraventricular septum remains appropriately convex and there is associated right atrial enlargement with reflux of contrast which could suggest more longstanding right heart enlargement and chronic pulmonary artery hypertension rather than acute right heart strain though if there is clinical concern, echocardiography could be performed for more definitive assessment. 3. Extensive chronic bullous paraseptal predominant emphysematous changes. Scattered areas of thick walled cystic bulla towards the apices with some peripheral nodularity and layering air-fluid level within the left apical bulla which could reflect a persisting fungal or atypical superinfection. Certainly could present with hemoptysis. 4. Further mixed areas of ground-glass and consolidative opacity in the left lower and right lower lobe periphery, also likely infectious or inflammatory with the differential including potential atypical viral infection such as COVID-19. 5. Cholelithiasis. 6. Moderate bilateral gynecomastia. 7. Paucity of subcutaneous fat.  Correlate with nutritional status. 8. Aortic Atherosclerosis (ICD10-I70.0). 9. Emphysema (ICD10-J43.9).  These results were called by telephone at the time of interpretation on 06/04/2020 at 10:50 pm to provider DAVID YAO , who verbally acknowledged these results.   Electronically Signed   By: Lovena Le M.D.   On: 06/04/2020 22:50  ROS - per HPI     OV 11/02/2020  Subjective:  Patient ID: Katheren Puller, male , DOB: 06/15/1976 , age 33 y.o. , MRN: [id number] , ADDRESS: [place] [id number] PCP Robert Bellow, PA-C Patient Care Team: Rip Harbour as PCP - General (Internal Medicine) Tommy Medal, Lavell Islam, MD as Consulting Physician (Infectious Diseases) System, Provider Not In as Consulting Physician Brand Males, MD as Consulting Physician (Pulmonary Disease)  This Provider for this visit: Treatment Team:  Attending Provider: Brand Males, MD     Follow-up - - severe persistent asthma with ABPA and highly elevated IgE levels [4000 and 2012] on prednisone 60m daily, BREZTRI  - >  he  and on Xolair since early 2017 bu tstopped since spring 2019 due to insurance issues. FASENRA since April/May 2020 -  Aspergilloma   - Rx voriconazole -> developed skin lesions and darkness in the forearms ->Rx Cresemba via Dr VTommy Medal  -  status post IR guided embolization of hemoptysis end 2016  -  MAI infection. 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 Follow-up  Patient had bronchitis in October and has not felt good since then, BP low today. More tired than normal, shortness of breath is worse. Productive cough with yellow/green sputum since October     HPI EERO DINI 45 y.o. -returns for follow-up.. Last seen in July 2021. After that he is seen nurse practitioner because of exacerbation symptoms. The check sputum culture couple of times I reviewed the records but this is all no growth. He says that he now has increased sputum production than baseline. It is yellow-green in color. He is frustrated that the cultures have not grown anything. In addition is reporting significant fatigue. His last blood IgE was in 2020 when it improved from 4000 several years ago to 500. He had recent chest x-ray in the last couple of months both of them was stable with chronic changes. He feels  very miserable because of his fatigue and respiratory symptoms. He says not able to sleep well in the night. He says the quality of life is miserable  He is up-to-date with his vaccination   OV 12/30/2020  Subjective:  Patient ID: PRECIOUS GILCHREST, male , DOB: Mar 28, 1976 , age 30 y.o. , MRN: [id number] , [address]: [place] [id number] PCP Robert Bellow, PA-C Patient Care Team: Rip Harbour as PCP - General (Internal Medicine) Tommy Medal, Lavell Islam, MD as Consulting Physician (Infectious Diseases) System, Provider Not In as Consulting Physician Brand Males, MD as Consulting Physician (Pulmonary Disease)  This Provider for this visit: Treatment Team:  Attending Provider: Melvenia Needles, NP    12/30/2020 -   Chief Complaint  Patient presents with  . Follow-up    Breathing is not doing well     Follow-up - - severe persistent asthma with ABPA and highly elevated IgE levels [4000 and 2012]   -> on prednisone 31m daily -. 144mdaily, BREZTRI  - >  He was on Xolair since early 2017 bu tstopped since spring 2019 due to insurance issues. -> resarted Ja 2022 due to worsening IgE and symptoms /PFT  -> FASENRA since April/May 2020  - > started VVEST Jan 20202  -  Aspergilloma   - Rx voriconazole -> developed skin lesions and darkness in the forearms ->Rx Cresemba via Dr VaTommy Medal -  status post IR guided embolization of hemoptysis end 2016  -  MAI infection. 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 -presents for visit.  He tells me in the last 3 weeks he has had a decline in shortness of breath increased cough with yellow sputum production.  He says sputum has been repeatedly tested and culture negative.  He is also reporting desaturations at home when he exerts  himself moving boxes at work or when he climbs a flight of stairs.  He feels he wants oxygen.  He does started Xolair a few weeks ago and he feels it has not kicked in as yet.  Today we walked him up a flight of stairs and he desaturated to 85% got extremely short of breath.  He corrected with 2 L nasal cannula. I personally walked him up   CT Chest data  No results found.    PFT  PFT Results Latest Ref Rng & Units 11/30/2020 06/25/2017 08/06/2016  FVC-Pre L 2.09  2.52 2.84  FVC-Predicted Pre % 47 50 61  FVC-Post L 2.16 - 2.93  FVC-Predicted Post % 49 - 63  Pre FEV1/FVC % % 56 46 53  Post FEV1/FCV % % 56 - 56  FEV1-Pre L 1.17 1.16 1.50  FEV1-Predicted Pre % 33 28 39  FEV1-Post L 1.22 - 1.63  DLCO uncorrected ml/min/mmHg 15.81 - 18.56  DLCO UNC% % 53 - 59  DLCO corrected ml/min/mmHg 16.67 - 18.35  DLCO COR %Predicted % 56 - 59  DLVA Predicted % 123 - 108  TLC L - - 5.14  TLC % Predicted % - - 76  RV % Predicted % - - 129       has a past medical history of Anemia, 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[place]), Chronic viral hepatitis B without delta-agent ([place]) (11/23/2020), Drug rash (12/16/2017), Dyspnea, Esophageal reflux, Hypothyroidism, Lung disease, bullous ([facility]), MAI (mycobacterium avium-intracellulare) ([place]), Mold exposure (12/05/2015), Photosensitivity dermatitis (09/16/2017), Solar lentigo (08/08/2015), Vaccine counseling (12/28/2020), and Weight loss (10/15/2016).   reports that he quit smoking about 13 years ago. His smoking use included cigarettes. He has a 13.30 pack-year smoking history. He quit smokeless tobacco use about 26 years ago.  His smokeless tobacco use included snuff.  Past Surgical History:  Procedure Laterality Date  . CHEST TUBE INSERTION Right 09/29/2018   Procedure: CHEST TUBE REPLACEMENT;  Surgeon: Melrose Nakayama, MD;  Location: [place];  Service: Thoracic;  Laterality: Right;  . INSERTION OF IBV VALVE N/A 09/19/2018   Procedure: INSERTION OF INTERBRONCHIAL VALVE (IBV);   Surgeon: Melrose Nakayama, MD;  Location[facility];  Service: Thoracic;  Laterality: N/A;  . INSERTION OF IBV VALVE N/A 12/01/2018   Procedure: REMOVAL OF INTERBRONCHIAL VALVE (IBV);  Surgeon: Melrose Nakayama, MD;  Location[facility] OR;  Service: Thoracic;  Laterality: N/A;  . left VATS  2012   thoracotomy and LUL apical posterior segmentectomy  . STAPLING OF BLEBS Right 09/12/2018   Procedure: STAPLING OF BLEBS, right lower and middle lung lobes;  Surgeon: Melrose Nakayama, MD;  Location: [facility];  Service: Thoracic;  Laterality: Right;  Marland Kitchen VIDEO ASSISTED THORACOSCOPY Right 09/12/2018   Procedure: VIDEO ASSISTED THORACOSCOPY, right lung;  Surgeon: Melrose Nakayama, MD;  Location: [place];  Service: Thoracic;  Laterality: Right;  Marland Kitchen VIDEO BRONCHOSCOPY Bilateral 10/20/2015   Procedure: VIDEO BRONCHOSCOPY WITHOUT FLUORO;  Surgeon: Marshell Garfinkel, MD;  Location: [place];  Service: Cardiopulmonary;  Laterality: Bilateral;  . VIDEO BRONCHOSCOPY N/A 09/19/2018   Procedure: VIDEO BRONCHOSCOPY;  Surgeon: Melrose Nakayama, MD;  Location: [facility] OR;  Service: Thoracic;  Laterality: N/A;  . VIDEO BRONCHOSCOPY N/A 12/01/2018   Procedure: VIDEO BRONCHOSCOPY;  Surgeon: Melrose Nakayama, MD;  Location: [facility];  Service: Thoracic;  Laterality: N/A;  . VIDEO BRONCHOSCOPY WITH INSERTION OF INTERBRONCHIAL VALVE (IBV) N/A 09/29/2018   Procedure: VIDEO BRONCHOSCOPY WITH INSERTION OF INTERBRONCHIAL VALVE  (IBV);  Surgeon: Melrose Nakayama, MD;  Location[facility] OR;  Service: Thoracic;  Laterality: N/A;    Allergies  Allergen Reactions  . Clarithromycin Other (See Comments)    Adverse reaction w/ voriconazole UNSPECIFIED REACTION   . Rifaximin Other (See Comments)    Adverse reaction w/ voriconazole UNSPECIFIED REACTION     Immunization History  Administered Date(s) Administered  . DTaP 11/19/2010  . Influenza Split 10/29/2011, 08/14/2012  . Influenza Whole 09/19/2010  . Influenza,inj,Quad  PF,6+ Mos 11/23/2013, 09/28/2014, 09/06/2015, 08/27/2016, 08/10/2017, 09/09/2018, 08/06/2019, 08/10/2020  . PFIZER Comirnaty(Gray Top)Covid-19 Tri-Sucrose Vaccine 12/28/2020  . PFIZER(Purple  Top)SARS-COV-2 Vaccination 02/18/2020, 03/14/2020, 08/10/2020  . Pneumococcal Conjugate-13 04/29/2017, 04/29/2017  . Pneumococcal Polysaccharide-23 12/17/2010    Family History  Adopted: Yes     Current Outpatient Medications:  .  albuterol (PROVENTIL) (2.5 MG/3ML) 0.083% nebulizer solution, Take 3 mLs (2.5 mg total) by nebulization every 6 (six) hours as needed for wheezing or shortness of breath., Disp: 75 mL, Rfl: 12 .  albuterol (VENTOLIN HFA) 108 (90 Base) MCG/ACT inhaler, Inhale 2 puffs into the lungs every 6 (six) hours as needed., Disp: 18 g, Rfl: 2 .  BREZTRI AEROSPHERE 160-9-4.8 MCG/ACT AERO, INHALE 2 PUFFS TWICE DAILY, Disp: 11 g, Rfl: 1 .  Calcium Carbonate-Vitamin D 600-400 MG-UNIT tablet, Take 1 tablet by mouth daily. , Disp: , Rfl:  .  CRESEMBA 186 MG CAPS, TAKE 2 CAPSULES BY MOUTH DAILY, Disp: 56 capsule, Rfl: 5 .  ethambutol (MYAMBUTOL) 400 MG tablet, TAKE 2 AND 1/2 TABLETS BY MOUTH ONCE DAILY, Disp: 598 tablet, Rfl: 5 .  feeding supplement, ENSURE ENLIVE, (ENSURE ENLIVE) LIQD, Take 237 mLs by mouth 3 (three) times daily between meals. (Patient taking differently: Take 237 mLs by mouth 2 (two) times daily between meals.), Disp: 237 mL, Rfl: 12 .  FEROSUL 325 (65 Fe) MG tablet, Take 325 mg by mouth every morning., Disp: , Rfl:  .  folic acid (FOLVITE) 1 MG tablet, Take 1 mg by mouth daily., Disp: , Rfl:  .  guaiFENesin (MUCINEX) 600 MG 12 hr tablet, Take 1 tablet (600 mg total) by mouth 2 (two) times daily., Disp: 30 tablet, Rfl: 0 .  levothyroxine (SYNTHROID) 88 MCG tablet, Take 100 mcg by mouth daily. , Disp: , Rfl:  .  mirtazapine (REMERON SOL-TAB) 15 MG disintegrating tablet, Take by mouth., Disp: , Rfl:  .  omalizumab (XOLAIR) 150 MG/ML prefilled syringe, Inject 300 mg into the skin  every 14 (fourteen) days., Disp: 4 mL, Rfl: 11 .  omalizumab (XOLAIR) 75 MG/0.5ML prefilled syringe, Inject 75 mg into the skin every 14 (fourteen) days., Disp: 1 mL, Rfl: 11 .  predniSONE (DELTASONE) 10 MG tablet, Take 10 mg by mouth daily with breakfast., Disp: , Rfl:  .  predniSONE (DELTASONE) 10 MG tablet, Take 4 tablets (40 mg total) by mouth daily with breakfast for 4 days, THEN 3 tablets (30 mg total) daily with breakfast for 4 days, THEN 2 tablets (20 mg total) daily with breakfast for 4 days. Then back to baseline dose of 36m daily.., Disp: 36 tablet, Rfl: 0 .  EPINEPHrine 0.3 mg/0.3 mL IJ SOAJ injection, Inject 0.3 mg into the muscle once for 1 dose., Disp: 1 each, Rfl: 11      Objective:   Vitals:   12/30/20 1208  BP: 118/76  Pulse: 74  Temp: 97.6 F (36.4 C)  TempSrc: Oral  SpO2: 96%  Weight: 120 lb 6.4 oz (54.6 kg)  Height: _0  (1.727 m)    Estimated body mass index is 18.31 kg/m as calculated from the following:   Height as of this encounter: _1  (1.727 m).   Weight as of this encounter: 120 lb 6.4 oz (54.6 kg).  _2 @  [facility]  12/30/20 1208  Weight: 120 lb 6.4 oz (54.6 kg)     Physical Exam  General: No distress at rest but when he climbed a flight of stairs he got dyspneic and had to sit down.  Had class IV dyspnea. Neuro: Alert and Oriented x 3. GCS 15. Speech normal Psych: Pleasant Resp:  Barrel  Chest - no.  Wheeze - no but did wheeze when he climbs up a flight of stairs, Crackles - no, No overt respiratory distress CVS: Normal heart sounds. Murmurs - no Ext: Stigmata of Connective Tissue Disease - no HEENT: Normal upper airway. PEERL +. No post nasal drip        Assessment:       ICD-10-CM   1. 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 Mycobacterium avium complex ([place])  A31.0 Ambulatory Referral for DME    Pulse oximetry, overnight       Plan:     Patient Instructions         ICD-10-CM   1. 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 Elevated IgE level  R76.8   5. 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[place])  A31.0     You are having progressive worsening of lung function Hopefully the Xolair kicks in You now desaturating with exertion after climbing 1 flight of stairs to 85%  Plan -Start exertional portable oxygen 2 L nasal cannula -Test overnight oxygen study room air at night - Please take Take prednisone 49m once daily x 4 days, then 376monce daily x 4 days, then 2036mnce daily x 4  days, then prednisone 30m57mce daily  baseline dose continuye  - flutter valve, vest Rx  Followup - March 2022 with Diontae Route or Tammy Parrett    ( Level 05 visit: Estb 40-54 min  in  visit type: on-site physical face to visit  in total care time and counseling or/and coordination of care by this undersigned MD - Dr MuraBrand Malesis includes one or more of the following on this same day 12/30/2020: pre-charting, chart review, note writing, documentation discussion of test results, diagnostic or treatment recommendations, prognosis, risks and benefits of management options, instructions, education, compliance or risk-factor reduction. It excludes time spent by the [facility] [place] staff in the care of the patient. Actual time 40 min)   SIGNATURE    Dr. MuraBrand MalesD., F.C.C.P,  Pulmonary and Critical Care Medicine Staff Physician, [facility] Disease  Program  Pulmonary  [place], [id number]: [phone] no answer or between  15:00h - 7:00h: call [phone] Telephone: [phone]  12:49 PM 12/30/2020

## 2021-01-04 ENCOUNTER — Ambulatory Visit: Payer: BC Managed Care – PPO | Admitting: Internal Medicine

## 2021-01-09 ENCOUNTER — Telehealth: Payer: Self-pay | Admitting: Internal Medicine

## 2021-01-09 MED FILL — XOLAIR 75 MG/0.5ML SOSY: 75 | 28 days supply | Qty: 1 | Fill #1

## 2021-01-09 MED FILL — XOLAIR 150 MG/ML SOSY: 150 | 28 days supply | Qty: 4 | Fill #1

## 2021-01-09 NOTE — Telephone Encounter (Signed)
[name] Order: 10m #1 prefilled syringe Ordered date: 01/09/21 Expected date of arrival: 01/10/21 Ordered by: LBaneberry [name]

## 2021-01-10 NOTE — Telephone Encounter (Signed)
[name] Shipment Received:  92m #1 prefilled syringe Medication arrival date: 01/10/21 Lot #: [id number] date: 04/18/2022 Received by: LElliot Dally

## 2021-01-11 ENCOUNTER — Ambulatory Visit (INDEPENDENT_AMBULATORY_CARE_PROVIDER_SITE_OTHER): Payer: BC Managed Care – PPO

## 2021-01-11 ENCOUNTER — Other Ambulatory Visit: Payer: Self-pay

## 2021-01-11 DIAGNOSIS — J4550 Severe persistent asthma, uncomplicated: Secondary | ICD-10-CM | POA: Diagnosis not present

## 2021-01-11 DIAGNOSIS — J8283 Eosinophilic asthma: Secondary | ICD-10-CM | POA: Diagnosis not present

## 2021-01-11 MED ORDER — OMALIZUMAB 150 MG/ML ~~LOC~~ SOSY
300.0000 mg | PREFILLED_SYRINGE | Freq: Once | SUBCUTANEOUS | Status: AC
Start: 2021-01-11 — End: 2021-01-11
  Administered 2021-01-11: 300 mg via SUBCUTANEOUS

## 2021-01-11 MED ORDER — OMALIZUMAB 75 MG/0.5ML ~~LOC~~ SOSY
75.0000 mg | PREFILLED_SYRINGE | Freq: Once | SUBCUTANEOUS | Status: AC
Start: 2021-01-11 — End: 2021-01-11
  Administered 2021-01-11: 75 mg via SUBCUTANEOUS

## 2021-01-11 NOTE — Progress Notes (Signed)
Have you been hospitalized within the last 10 days?  No Do you have a fever?  No Do you have a cough?  No Do you have a headache or sore throat? No Do you have your Epi Pen visible and is it within date?  Yes 

## 2021-01-15 ENCOUNTER — Other Ambulatory Visit (HOSPITAL_COMMUNITY): Payer: Self-pay | Admitting: Pharmacy Technician

## 2021-01-18 ENCOUNTER — Ambulatory Visit (INDEPENDENT_AMBULATORY_CARE_PROVIDER_SITE_OTHER): Payer: BC Managed Care – PPO

## 2021-01-18 ENCOUNTER — Other Ambulatory Visit: Payer: Self-pay

## 2021-01-18 DIAGNOSIS — J4550 Severe persistent asthma, uncomplicated: Secondary | ICD-10-CM

## 2021-01-18 MED ORDER — BENRALIZUMAB 30 MG/ML ~~LOC~~ SOSY
30.0000 mg | PREFILLED_SYRINGE | Freq: Once | SUBCUTANEOUS | Status: AC
  Administered 2021-01-18: 30 mg via SUBCUTANEOUS

## 2021-01-18 NOTE — Progress Notes (Signed)
Have you been hospitalized within the last 10 days?  No Do you have a fever?  No Do you have a cough?  No Do you have a headache or sore throat? No Do you have your Epi Pen visible and is it within date?  Yes 

## 2021-01-25 ENCOUNTER — Other Ambulatory Visit: Payer: Self-pay

## 2021-01-25 ENCOUNTER — Ambulatory Visit (INDEPENDENT_AMBULATORY_CARE_PROVIDER_SITE_OTHER): Payer: BC Managed Care – PPO

## 2021-01-25 DIAGNOSIS — D649 Anemia, unspecified: Secondary | ICD-10-CM | POA: Diagnosis not present

## 2021-01-25 DIAGNOSIS — J4550 Severe persistent asthma, uncomplicated: Secondary | ICD-10-CM

## 2021-01-25 DIAGNOSIS — F329 Major depressive disorder, single episode, unspecified: Secondary | ICD-10-CM | POA: Diagnosis not present

## 2021-01-25 DIAGNOSIS — R634 Abnormal weight loss: Secondary | ICD-10-CM | POA: Diagnosis not present

## 2021-01-25 MED ORDER — OMALIZUMAB 75 MG/0.5ML ~~LOC~~ SOSY
75.0000 mg | PREFILLED_SYRINGE | Freq: Once | SUBCUTANEOUS | Status: AC
Start: 2021-01-25 — End: 2021-01-25
  Administered 2021-01-25: 75 mg via SUBCUTANEOUS

## 2021-01-25 MED ORDER — OMALIZUMAB 150 MG/ML ~~LOC~~ SOSY
300.0000 mg | PREFILLED_SYRINGE | Freq: Once | SUBCUTANEOUS | Status: AC
Start: 2021-01-25 — End: 2021-01-25
  Administered 2021-01-25: 300 mg via SUBCUTANEOUS

## 2021-01-25 NOTE — Progress Notes (Signed)
Have you been hospitalized within the last 10 days?  No Do you have a fever?  No Do you have a cough?  No Do you have a headache or sore throat? No Do you have your Epi Pen visible and is it within date?  Yes

## 2021-01-26 ENCOUNTER — Other Ambulatory Visit: Payer: Self-pay | Admitting: Infectious Disease

## 2021-01-26 MED FILL — CRESEMBA 186 MG CAPSULE: 186 | 28 days supply | Qty: 56 | Fill #1

## 2021-01-27 DIAGNOSIS — B4481 Allergic bronchopulmonary aspergillosis: Secondary | ICD-10-CM | POA: Diagnosis not present

## 2021-01-27 DIAGNOSIS — A312 Disseminated mycobacterium avium-intracellulare complex (DMAC): Secondary | ICD-10-CM | POA: Diagnosis not present

## 2021-01-27 DIAGNOSIS — R0902 Hypoxemia: Secondary | ICD-10-CM | POA: Diagnosis not present

## 2021-01-27 DIAGNOSIS — B449 Aspergillosis, unspecified: Secondary | ICD-10-CM | POA: Diagnosis not present

## 2021-01-30 ENCOUNTER — Telehealth: Payer: Self-pay | Admitting: Internal Medicine

## 2021-01-30 DIAGNOSIS — J479 Bronchiectasis, uncomplicated: Secondary | ICD-10-CM | POA: Diagnosis not present

## 2021-01-30 NOTE — Telephone Encounter (Signed)
Sorry to hear that - please stop using the vest for now. Warm heat to the rib area as needed.  F/up this week as planned, call back if needs to come in sooner  Please contact office for sooner follow up if symptoms do not improve or worsen or seek emergency care

## 2021-01-30 NOTE — Telephone Encounter (Signed)
Call returned to patient, made aware of TP recommendations. Voiced understanding.   Nothing further needed at this time.

## 2021-01-30 NOTE — Telephone Encounter (Signed)
Call returned to patient confirmed patient DOB. Symptoms just started yesterday. Patient reports since using is aflovest he has experienced rib cage pain, area tender to touch and sore, increased pain when trying to take a deep breath. Denies bruising or chest pain. Patient is asking if he is using it too much or if he needs to turn down the pressure. He is using the vest two times daily and sometimes three. He is currently using the medium pressure setting. He uses for 20-30 minutes at a time.   TP please advise. Thanks :)

## 2021-01-30 NOTE — Telephone Encounter (Signed)
pt is calling because  he's been using Afflovest for about 2 weeks.. pt is experiencing pain in rib cage, ribs on left side is bruised to touch,hard to breath due to pain, pt wondering if hes using it too much, needs to switch pressure from medium or low or quit using till ribs heal  please advise  [phone]

## 2021-02-02 ENCOUNTER — Telehealth: Payer: Self-pay | Admitting: Adult Health

## 2021-02-02 ENCOUNTER — Encounter: Payer: Self-pay | Admitting: Adult Health

## 2021-02-02 ENCOUNTER — Other Ambulatory Visit: Payer: Self-pay

## 2021-02-02 ENCOUNTER — Ambulatory Visit: Payer: BC Managed Care – PPO | Admitting: Adult Health

## 2021-02-02 VITALS — BP 104/70 | HR 87 | Temp 97.7°F | Ht 68.0 in | Wt 119.0 lb

## 2021-02-02 DIAGNOSIS — R0789 Other chest pain: Secondary | ICD-10-CM

## 2021-02-02 DIAGNOSIS — R0781 Pleurodynia: Secondary | ICD-10-CM

## 2021-02-02 DIAGNOSIS — B4481 Allergic bronchopulmonary aspergillosis: Secondary | ICD-10-CM | POA: Diagnosis not present

## 2021-02-02 DIAGNOSIS — J4550 Severe persistent asthma, uncomplicated: Secondary | ICD-10-CM

## 2021-02-02 DIAGNOSIS — B440 Invasive pulmonary aspergillosis: Secondary | ICD-10-CM

## 2021-02-02 DIAGNOSIS — J9611 Chronic respiratory failure with hypoxia: Secondary | ICD-10-CM

## 2021-02-02 DIAGNOSIS — J479 Bronchiectasis, uncomplicated: Secondary | ICD-10-CM

## 2021-02-02 DIAGNOSIS — A310 Pulmonary mycobacterial infection: Secondary | ICD-10-CM

## 2021-02-02 NOTE — Progress Notes (Signed)
_0  ID: [name], male    DOB: 12-09-1975, 45 y.o.   MRN: [id number]  Chief Complaint  Patient presents with  . 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, MAC , aspergilloma History of recurrent hemoptysis with severe episode in 2016 required interventional guided embolization History of aspergilloma status post VATS in 2012 on lifelong antifungal followed by infectious disease ABPA on chronic steroids with prednisone 10 mg daily MAC followed by ID on ethambutol and azithromycin History of tension pneumothorax in October 2019  TEST/EVENTS :  7/13 Ig E 800s(~4000 2012 ) 12/10/11 Sputum AFB Smear and culture negative (prelim) 12/10/11 Fungal sputum culture - negative (final) 01/30/2012 spirometry - fev1 1.7L/41%, ratip 52 - BEST EVER 01/30/2012 walking desaturation test: 185 feet x 3 laps: did not deaturate 05/2014 >Spiro fev1 1.7L/40%, ratio 55 Spirometry 06/25/2017 showed FEV1 at 28%, ratio 46, FVC 50%. CT chest July 2021 showed chronic narrowing and occlusion of several of the right upper lobe segmental and subsegmental pulmonary arteries additional filling defects in the left upper lobe segmental and subsegmental branches. Extensive bullous disease. Architectural distortion most suggestive of chronic embolus/occlusion, right atrial enlargement , Groundglass and consolidative opacity in the left lower and right lower periphery  Sputum culture July 2021 + for Klebsiella pneumonia-pansensitive except for ampicillin.  August 20212D echo showed EF 54%, grade 1 diastolic dysfunction mildly elevated pulmonary artery systolic pressure at 39 mmHg  Events  2010 Dx with MAI >Azithro/Ethambutol 2012 Dx with Aspergilloma >VATS >started on Voriconazaol  2012 Dx w/ ABPA w/ High IgE >4000>started on prednisone  2013 tried off azithr/Etham but developed fever, Cx neg but  restarted on rx.  2016 Hospitalzed with hemoptysis >IR guided embolization . FOB/BAL + Saccharomyces on fungal fx . Serum Galactomannan was neg.  [name] evaluation 04/2017 for Transplant -rejected. Felt to early for transplant- recommended pre-transplant weight goal 128 lbs for a BMI > 19  02/02/2021 Follow up : Asthma, ABPA, aspergilloma, MAC Patient returns for 75-monthfollow-up.  Patient says overall breathing is doing about the same he has good days and bad days however he was starting to feel better until the last 5 days.  He was started on vest therapy.  Says that since using this he has had some pain along the left lateral ribs.  Patient says he has no bruising.  Has had no known injury.  He is also had no increased cough or congestion.  Patient continues to have daily cough and congestion.  He has recently started Xolair, says he feels like he is tolerating okay with no known side effects. He denies any hemoptysis, fever, chest pain, orthopnea. Continues to have low activity tolerance.  Feels fatigued most days.. Recently noted to have desaturations with ambulation now on oxygen 2 L.  Does feel that it helps some.  Allergies  Allergen Reactions  . Clarithromycin Other (See Comments)    Adverse reaction w/ voriconazole UNSPECIFIED REACTION   . Rifaximin Other (See Comments)    Adverse reaction w/ voriconazole UNSPECIFIED REACTION     Immunization History  Administered Date(s) Administered  . DTaP 11/19/2010  . Influenza Split 10/29/2011, 08/14/2012  . Influenza Whole 09/19/2010  . Influenza,inj,Quad PF,6+ Mos 11/23/2013, 09/28/2014, 09/06/2015, 08/27/2016, 08/10/2017, 09/09/2018, 08/06/2019, 08/10/2020  . PFIZER Comirnaty(Gray Top)Covid-19 Tri-Sucrose Vaccine 12/28/2020  . PFIZER(Purple Top)SARS-COV-2 Vaccination 02/18/2020, 03/14/2020, 08/10/2020  . Pneumococcal Conjugate-13 04/29/2017, 04/29/2017  . Pneumococcal Polysaccharide-23  12/17/2010    Past Medical History:  Diagnosis  Date  . Anemia   . 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 Chronic viral hepatitis B without delta-agent ([place]) 11/23/2020  . Drug rash 12/16/2017  . Dyspnea   . Esophageal reflux    very rare  . 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 Lung disease, bullous ([place])   . MAI (mycobacterium avium-intracellulare) ([place])   . Mold exposure 12/05/2015  . 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 Solar lentigo 08/08/2015  . Vaccine counseling 12/28/2020  . Weight loss 10/15/2016    Tobacco History: Social History   Tobacco Use  Smoking Status Former Smoker  . Packs/day: 0.70  . Years: 19.00  . Pack years: 13.30  . Types: Cigarettes  . Quit date: 11/20/2007  . Years since quitting: 13.2  Smokeless Tobacco Former Systems developer  . Types: Snuff  . Quit date: 11/19/1994   Counseling given: Not Answered   Outpatient Medications Prior to Visit  Medication Sig Dispense Refill  . albuterol (PROVENTIL) (2.5 MG/3ML) 0.083% nebulizer solution Take 3 mLs (2.5 mg total) by nebulization every 6 (six) hours as needed for wheezing or shortness of breath. 75 mL 12  . albuterol (VENTOLIN HFA) 108 (90 Base) MCG/ACT inhaler Inhale 2 puffs into the lungs every 6 (six) hours as needed. 18 g 2  . azithromycin (ZITHROMAX) 500 MG tablet Take 1 tablet by mouth once daily 30 tablet 5  . BREZTRI AEROSPHERE 160-9-4.8 MCG/ACT AERO INHALE 2 PUFFS TWICE DAILY 11 g 1  . Calcium Carbonate-Vitamin D 600-400 MG-UNIT tablet Take 1 tablet by mouth daily.     Marland Kitchen CRESEMBA 186 MG CAPS TAKE 2 CAPSULES BY MOUTH DAILY 56 capsule 5  . ethambutol (MYAMBUTOL) 400 MG tablet TAKE 2 AND 1/2 TABLETS BY MOUTH ONCE DAILY 598 tablet 5  . feeding supplement, ENSURE ENLIVE, (ENSURE ENLIVE) LIQD Take 237 mLs by mouth 3 (three) times daily between meals. (Patient taking differently: Take 237 mLs by mouth 2 (two) times daily between meals.) 237 mL 12  . FEROSUL 325 (65 Fe) MG tablet Take 325 mg by mouth every morning.    . folic acid (FOLVITE) 1 MG tablet  Take 1 mg by mouth daily.    Marland Kitchen guaiFENesin (MUCINEX) 600 MG 12 hr tablet Take 1 tablet (600 mg total) by mouth 2 (two) times daily. 30 tablet 0  . levothyroxine (SYNTHROID) 88 MCG tablet Take 100 mcg by mouth daily.     . mirtazapine (REMERON SOL-TAB) 15 MG disintegrating tablet Take by mouth.    Marland Kitchen omalizumab Arvid Right) 150 MG/ML prefilled syringe Inject 300 mg into the skin every 14 (fourteen) days. 4 mL 11  . omalizumab (XOLAIR) 75 MG/0.5ML prefilled syringe Inject 75 mg into the skin every 14 (fourteen) days. 1 mL 11  . predniSONE (DELTASONE) 10 MG tablet Take 10 mg by mouth daily with breakfast.    . EPINEPHrine 0.3 mg/0.3 mL IJ SOAJ injection Inject 0.3 mg into the muscle once for 1 dose. 1 each 11   No facility-administered medications prior to visit.     Review of Systems:   Constitutional:   No  weight loss, night sweats,  Fevers, chills,  +fatigue, or  lassitude.  HEENT:   No headaches,  Difficulty swallowing,  Tooth/dental problems, or  Sore throat,                No sneezing, itching, ear ache, nasal congestion, post nasal drip,   CV:  No chest pain,  Orthopnea, PND, swelling in lower extremities, anasarca, dizziness, palpitations, syncope.   GI  No heartburn, indigestion, abdominal pain, nausea, vomiting, diarrhea, change in bowel habits, loss of appetite, bloody stools.   Resp:  .  No chest wall deformity  Skin: no rash or lesions.  GU: no dysuria, change in color of urine, no urgency or frequency.  No flank pain, no hematuria   MS:  No joint pain or swelling.  No decreased range of motion.  No back pain.    Physical Exam  BP 104/70 (BP Location: Left Arm, Cuff Size: Normal)   Pulse 87   Temp 97.7 F (36.5 C)   Ht _0  (1.727 m)   Wt 119 lb (54 kg)   SpO2 98%   BMI 18.09 kg/m   GEN: A/Ox3; pleasant , NAD, thin and cachectic   HEENT:  [place]/AT,   NOSE-clear, THROAT-clear, no lesions, no postnasal drip or exudate noted.   NECK:  Supple w/ fair ROM; no JVD;  normal carotid impulses w/o bruits; no thyromegaly or nodules palpated; no lymphadenopathy.    RESP scattered rhonchi no accessory muscle use, no dullness to percussion No rib deformity or bruising along left lateral ribs.  Tenderness along the mid lateral ribs.  CARD:  RRR, no m/r/g, no peripheral edema, pulses intact, no cyanosis or clubbing.  GI:   Soft & nt; nml bowel sounds; no organomegaly or masses detected.   Musco: Warm bil, no deformities or joint swelling noted.   Neuro: alert, no focal deficits noted.    Skin: Warm, no lesions or rashes    Lab Results:   BNP No results found for: BNP   Imaging: No results found.  Benralizumab SOSY 30 mg    Date Action Dose Route User   01/18/2021 1425 Given 30 mg Subcutaneous (Left Arm) Elton Sin, LPN    omalizumab Arvid Right) prefilled syringe 300 mg    Date Action Dose Route User   12/14/2020 1543 Given 300 mg Subcutaneous (Left Arm) Elton Sin, LPN    omalizumab Arvid Right) prefilled syringe 300 mg    Date Action Dose Route User   12/28/2020 1415 Given 300 mg Subcutaneous (Right Arm) Elton Sin, LPN    omalizumab Arvid Right) prefilled syringe 300 mg    Date Action Dose Route User   01/11/2021 1515 Given 300 mg Subcutaneous (Left Arm) Elton Sin, LPN    omalizumab Arvid Right) prefilled syringe 300 mg    Date Action Dose Route User   01/25/2021 1454 Given 300 mg Subcutaneous (Right Arm) Elton Sin, LPN    omalizumab Arvid Right) prefilled syringe 75 mg    Date Action Dose Route User   12/14/2020 1544 Given 75 mg Subcutaneous (Right Arm) Elton Sin, LPN    omalizumab Arvid Right) prefilled syringe 75 mg    Date Action Dose Route User   12/28/2020 1415 Given 75 mg Subcutaneous (Right Arm) Elton Sin, LPN    omalizumab Arvid Right) prefilled syringe 75 mg    Date Action Dose Route User   01/11/2021 1516 Given 75 mg Subcutaneous (Right Arm) Elton Sin, LPN    omalizumab Arvid Right) prefilled syringe 75 mg    Date  Action Dose Route User   01/25/2021 1455 Given 75 mg Subcutaneous (Left Arm) Elton Sin, LPN      PFT Results Latest Ref Rng & Units 11/30/2020 06/25/2017 08/06/2016  FVC-Pre L 2.09 2.52 2.84  FVC-Predicted Pre % 47 50 61  FVC-Post L 2.16 - 2.93  FVC-Predicted Post % 49 - 63  Pre FEV1/FVC % % 56 46 53  Post FEV1/FCV % % 56 - 56  FEV1-Pre L 1.17 1.16 1.50  FEV1-Predicted Pre % 33 28 39  FEV1-Post L 1.22 - 1.63  DLCO uncorrected ml/min/mmHg 15.81 - 18.56  DLCO UNC% % 53 - 59  DLCO corrected ml/min/mmHg 16.67 - 18.35  DLCO COR %Predicted % 56 - 59  DLVA Predicted % 123 - 108  TLC L - - 5.14  TLC % Predicted % - - 76  RV % Predicted % - - 129    No results found for: NITRICOXIDE      Assessment & Plan:   No problem-specific Assessment & Plan notes found for this encounter.     Rexene Edison, NP 02/02/2021

## 2021-02-02 NOTE — Patient Instructions (Addendum)
Left rib xray .  Remain off VEST for 2 weeks .  Restart VEST on low for 10 min daily for 1 week and 12mn twice daily .  Continue on BREZTRI 2 puffs Twice daily  , rinse after use.  Remain on Prednisone 140mdaily  Flutter valve Three times a day  .  Please eat often,  High protein food and drinks.  Continue on Fasenra and Xolair .  Continue with ID follow up.  Continue on Oxygen 2l/m with activity .  Will check results for Overnight oximetry .  Albuterol inhaler As needed  Wheezing/shortness of breath .  Albuterol neb every 6hrs as needed for wheezing /shortness of breath as needed.  Robitussin DM As needed  Cough/congestion  Follow up with Dr. RaChase Callerr Lashonda Sonneborn NP in 2 months and As needed   Please contact office for sooner follow up if symptoms do not improve or worsen or seek emergency care

## 2021-02-03 ENCOUNTER — Ambulatory Visit (INDEPENDENT_AMBULATORY_CARE_PROVIDER_SITE_OTHER)
Admission: RE | Admit: 2021-02-03 | Discharge: 2021-02-03 | Disposition: A | Discharge status: Home / Self Care | Payer: BC Managed Care – PPO | Source: Ambulatory Visit | Attending: Adult Health | Admitting: Adult Health

## 2021-02-03 ENCOUNTER — Telehealth: Payer: Self-pay | Admitting: Pharmacist

## 2021-02-03 DIAGNOSIS — R0781 Pleurodynia: Secondary | ICD-10-CM

## 2021-02-03 DIAGNOSIS — S2242XA Multiple fractures of ribs, left side, initial encounter for closed fracture: Secondary | ICD-10-CM | POA: Diagnosis not present

## 2021-02-03 DIAGNOSIS — J439 Emphysema, unspecified: Secondary | ICD-10-CM | POA: Diagnosis not present

## 2021-02-03 NOTE — Telephone Encounter (Signed)
Patient receives two asthma biologics - [name] and Xolair in clinic - now transitioning administration to [place] will have to continue to be administered through infusion center since it's through medical benefit.  Xolair is shipped from [facility]. Patient restarted Xolair 356m every 14 days on 11/30/20. Has received 5 doses in clinic so far without adverse effect or reaction. Patient has been open to transitioning Xolair to self-administration since being approved.  Dr. RChase Caller- if patient can be trained with pharmacy team for self-admin, are you amenable to switching him to self-admin Xolair and continue receiving Fasenra in clinic? Would reduce scheduling burden for patient.

## 2021-02-06 ENCOUNTER — Other Ambulatory Visit: Payer: Self-pay | Admitting: Pharmacy Technician

## 2021-02-06 DIAGNOSIS — J9611 Chronic respiratory failure with hypoxia: Secondary | ICD-10-CM | POA: Insufficient documentation

## 2021-02-06 NOTE — Assessment & Plan Note (Signed)
Continue with ID follow-up and regimen

## 2021-02-06 NOTE — Assessment & Plan Note (Signed)
Continue on oxygen 2 L with activity.  To maintain O2 saturations greater than 90%. Check overnight oximetry test

## 2021-02-06 NOTE — Telephone Encounter (Signed)
Duplicate encounter. Closing.

## 2021-02-06 NOTE — Telephone Encounter (Signed)
Called patient to schedule Xolair on pharmacy clinic on Wednesday 3/23. Unable to enter therapy plan for two asthma biologics. Will f/u

## 2021-02-06 NOTE — Assessment & Plan Note (Signed)
Stable continue on current regimen.

## 2021-02-06 NOTE — Telephone Encounter (Signed)
Patient scheduled for Xolair injection with pharmacy team on 02/08/21.  Will begin conversation for self-administration but patient aware we are still awaiting provider consent. Rx at [facility] to be couriered to clinic.  Knox Saliva, PharmD, MPH Clinical Pharmacist (Rheumatology and Pulmonology)

## 2021-02-06 NOTE — Assessment & Plan Note (Signed)
Continue on current regimen

## 2021-02-06 NOTE — Assessment & Plan Note (Signed)
Continue with ID follow-up and current maintenance regimen

## 2021-02-06 NOTE — Assessment & Plan Note (Signed)
Recently added vest therapy to help with mucociliary clearance.  Placed vest on hold with current pain in left lateral ribs.  Chest x-ray today.  Plan  Patient Instructions  Left rib xray .  Remain off VEST for 2 weeks .  Restart VEST on low for 10 min daily for 1 week and 31mn twice daily .  Continue on BREZTRI 2 puffs Twice daily  , rinse after use.  Remain on Prednisone 142mdaily  Flutter valve Three times a day  .  Please eat often,  High protein food and drinks.  Continue on Fasenra and Xolair .  Continue with ID follow up.  Continue on Oxygen 2l/m with activity .  Will check results for Overnight oximetry .  Albuterol inhaler As needed  Wheezing/shortness of breath .  Albuterol neb every 6hrs as needed for wheezing /shortness of breath as needed.  Robitussin DM As needed  Cough/congestion  Follow up with Dr. RaChase Callerr Brett Riffe NP in 2 months and As needed   Please contact office for sooner follow up if symptoms do not improve or worsen or seek emergency care

## 2021-02-07 NOTE — Telephone Encounter (Signed)
Ok to do both asthma biologics at home

## 2021-02-08 ENCOUNTER — Other Ambulatory Visit: Payer: Self-pay

## 2021-02-08 ENCOUNTER — Other Ambulatory Visit: Payer: Self-pay | Admitting: Internal Medicine

## 2021-02-08 ENCOUNTER — Ambulatory Visit: Payer: BC Managed Care – PPO | Admitting: Pharmacist

## 2021-02-08 ENCOUNTER — Ambulatory Visit: Payer: BC Managed Care – PPO | Admitting: Infectious Disease

## 2021-02-08 ENCOUNTER — Encounter: Payer: Self-pay | Admitting: Infectious Disease

## 2021-02-08 VITALS — BP 116/75 | HR 82 | Temp 98.5°F | Wt 116.0 lb

## 2021-02-08 DIAGNOSIS — L270 Generalized skin eruption due to drugs and medicaments taken internally: Secondary | ICD-10-CM

## 2021-02-08 DIAGNOSIS — J4550 Severe persistent asthma, uncomplicated: Secondary | ICD-10-CM

## 2021-02-08 DIAGNOSIS — B181 Chronic viral hepatitis B without delta-agent: Secondary | ICD-10-CM

## 2021-02-08 DIAGNOSIS — J479 Bronchiectasis, uncomplicated: Secondary | ICD-10-CM

## 2021-02-08 DIAGNOSIS — M8000XA Age-related osteoporosis with current pathological fracture, unspecified site, initial encounter for fracture: Secondary | ICD-10-CM

## 2021-02-08 DIAGNOSIS — B440 Invasive pulmonary aspergillosis: Secondary | ICD-10-CM

## 2021-02-08 DIAGNOSIS — S2231XA Fracture of one rib, right side, initial encounter for closed fracture: Secondary | ICD-10-CM | POA: Diagnosis not present

## 2021-02-08 DIAGNOSIS — Z7185 Encounter for immunization safety counseling: Secondary | ICD-10-CM

## 2021-02-08 DIAGNOSIS — B449 Aspergillosis, unspecified: Secondary | ICD-10-CM | POA: Diagnosis not present

## 2021-02-08 DIAGNOSIS — S2239XA Fracture of one rib, unspecified side, initial encounter for closed fracture: Secondary | ICD-10-CM

## 2021-02-08 DIAGNOSIS — A310 Pulmonary mycobacterial infection: Secondary | ICD-10-CM

## 2021-02-08 DIAGNOSIS — J4489 Other specified chronic obstructive pulmonary disease: Secondary | ICD-10-CM

## 2021-02-08 DIAGNOSIS — L568 Other specified acute skin changes due to ultraviolet radiation: Secondary | ICD-10-CM

## 2021-02-08 DIAGNOSIS — J449 Chronic obstructive pulmonary disease, unspecified: Secondary | ICD-10-CM

## 2021-02-08 HISTORY — DX: Fracture of one rib, unspecified side, initial encounter for closed fracture: S22.39XA

## 2021-02-08 MED ORDER — FASENRA PEN 30 MG/ML ~~LOC~~ SOAJ
30.0000 mg | SUBCUTANEOUS | 2 refills | Status: DC
  Filled 2021-03-02: qty 1, 56d supply, fill #0
  Filled 2021-04-27: qty 1, 56d supply, fill #1
  Filled 2021-06-28: qty 1, 56d supply, fill #2

## 2021-02-08 MED ORDER — OMALIZUMAB 75 MG/0.5ML ~~LOC~~ SOSY: 75.0000 mg | PREFILLED_SYRINGE | SUBCUTANEOUS | 11 refills | Status: DC

## 2021-02-08 MED ORDER — OMALIZUMAB 150 MG/ML ~~LOC~~ SOSY: 300.0000 mg | PREFILLED_SYRINGE | SUBCUTANEOUS | 11 refills | Status: DC

## 2021-02-08 NOTE — Progress Notes (Signed)
Chief complaint: Worsening cough since [name] had to discontinue his vest which caused a rib fracture s Subjective:    : Patient ID: [name], male    DOB: 08-09-1976, 45 y.o.   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 We had taken him off his Mycobacterium avium drugs and [name] developed fevers chills and malaise  Spring (2013) and was seen by my partner [name] in late April  His AFB smears and cultures obtained in the clinic on that day prior to the initiation of azithromycin ethambutol ultimately failed to grow Mycobacterium avium. [name] had been doing well I saw him in [place] 2013 we discontinued his azithromycin and ethambutol again. Unfortunately [name] again shortly thereafter developed fevers cough malaise. [name] started back on azithromycin ethambutol. Cultures done for AFB here were again negative. The patient however felt dramatically better having been back on azithromycin and ethambutol.   I saw him in January of 2015 when [name] was feeling relatively well.  [name] did have an episode of coughing up a few flecks of blood in January 2016 which prompted appt with [facility] but by time [name] made appt this had long since resolved. Was only a one time episode none since then.  I saw again in March and then in September 2016. In summer [name] was hospitalized with hemoptysis and found to have an area in the upper lobe consistent with an aspergilloma. [name] underwent bronchoscopy with BAL and cultures ultimately yielded a Saccharomyces species on fungal culture. His serum galactomannan was negative.  [name] had been  on voriconazole and axial has had radiographic improvement and his x-ray findings. [name] also  feelt etter symptomatically with no more hemoptysis improvement in his cough improvement in his energy though [name] still feels not nearly as well as [name] did prior to his hospitalization. [name] is avoiding exercised during the winter as it often precipitates coughing spells. [name] was  only taking 200 mg twice a day of voriconazole rather than the 300 mg [name] has continued this along with his ethambutol and azithromycin. [name] continued to have cough more so with exercise and has not been exercising at "[employer]" due to the fact this elicits more coughing. [name] has not smoked since 2007. [name] is seeing [name] and is being considered for new drug infusion to treat his COPD.  [name] did have 20# weight loss that [name] attributed to increased exercise.  [name] then saw  [name] after an episode of hematemesis. 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 [name] said [name] only coughed up a small amount of blood material and did not go to the ER but was seen and followed by pulmonary. Had a chest x-ray which showed no new findings.  [name] was then  going to be evaluated by the transplant team at [facility].  [name] did complain of hyperpigmented spots on sun exposed areas on his body including arms and neck which his dermatologist have told him is due to his voriconazole use.   We considered change at that time  Since that time [name] has [place] been admitted with hemoptysis at [facility].   Repeat CT lungs showed on 08/09/17:  Large emphysematous bulla  are noted in both upper lobes. There is interval development of fluid density in several bulla in the left upper lobe concerning for superimposed infection, possibly fungal in origin.  Stable bilateral calcified pulmonary nodules are noted consistent with prior granulomatous disease.  Stable tree in bud appearance is noted medially in the left lung posteriorly consistent with sequela of previous atypical infection.   Due to his skin toxicity we  decided to change him to [facility] [name] is on that now .  And had done well.  [name] had problems with bilateral pneumothoraces.  This required placement of an intrabronchial valve in November 2019.  This resolved but [name] had to be taken back to the operating room in early January where the intrabronchial valves were removed.  Was seen at [facility] medical transplant team as well but felt not yet to be a transplant candidate due to the fact that his lungs were not in severe enough condition to warrant transplantation  [name] had applied for disability but this has not yet gone through.  Certainly [name] deserves to have disability [name] has significant medical problems.  [name] did however eventually go back to work at [employer]  [name] is been receiving his antimycobacterial drugs and is [place].  I last saw [name] was hospitalized in July for what [name] says was pneumonia.  The CT scan was read as showing:  " Chronic narrowing and occlusion of several right upper lobe segmental subsegmental pulmonary arteries and filling defects in the left upper lobe segmental and subsegmental branches which were within the regions of bullous disease which are suggestive of chronic embolism/occlusion.  It showed extensive bullous and paraseptal predominant emphysematous changes with thick-walled cystic bullae toward the apices with nodularity and air layering air-fluid levels within the left apical bulla consistent with aspergilloma.  [name] had mixed areas of groundglass and consolidative opacity left lower lobe and right lower lobe periphery.  Nares note indicates that [name] was not anticoagulated due to the fact that [name] has recurrent hemoptysis per the patient himself was unaware that [name] was found to have anything suggestive clots on scan and believes [name] was only treated for pneumonia.  Since I last saw Valerie [name] was treated with Augmentin and corticosteroids for flare of bronchitis.  [name] improved on both steroids and  antibiotics but within 3 to 5 days after coming off the Augmentin had worsening cough with productive sputum particularly in the morning some with some blood streaking.  [name] had seen pulmonary who had recommended him come see [place] again and I saw him in November  [name] hadnot had fevers chills or other systemic symptoms.  [name] was apprehensive that [name] could be harboring resistant organisms.Out of his lungs.  Get I think there is certainly possibility that his mycobacteria could have developed some resistance and/or that [name] has a superimposed bacterial infection yet again.  At the time we switched out his azithromycin for levofloxacin.  His cough may have improved a bit in terms of his severity.  [name] says about 10 out of 10 in severity when [name] saw [place] last versus a 6-7.  Sputum is changed slightly now with a red tinge to it.  [name] was also seen by hematology oncology to work-up anemia at [facility] medical group.  In the context of his weight loss they also checked him for viral hepatitis panel and [place] hepatitis B surface antigen was positive along with a hepatitis B DNA though at a low  value value of 2050 copies  Since seen [name] and is starting Xolair in addition to the [place] [name] is already taking along with systemic prednisone (in midst of a taper)  We checked for delta agent this was negative.  We checked hepatitis B E antigen antibody which was positive.  We did an ultrasound screening for hepatocellular carcinoma and this was negative for any lesions.  [name] is also seen GI and hepatology at [facility].  There are plans for both an upper endoscopy and lower endoscopy to evaluate his anemia.    Once I last saw [place] [name] was fitted with a percussive vest by pulmonary.  Unfortunately since [name] began using the vest [name] sustained a fourth rib fracture which is apparently a site that [name] previously fractured.  [name] has had to stop his percussive vest.  [name] wonders if [name] was having it on  for too much of a long period of time or at too high of a setting for his body habitus.  [name] continues to be quite out of breath at times at work particular when lifting heavy objects.  [name] has had no blood in his sputum's recently.      Past Medical History:  Diagnosis Date  . Anemia   . 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[place])   . Chronic viral hepatitis B without delta-agent ([place]) 11/23/2020  . Drug rash 12/16/2017  . Dyspnea   . Esophageal reflux    very rare  . 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 Lung disease, bullous ([place])   . MAI (mycobacterium avium-intracellulare) ([place])   . Mold exposure 12/05/2015  . 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 Solar lentigo 08/08/2015  . Vaccine counseling 12/28/2020  . Weight loss 10/15/2016   Past Surgical History:  Procedure Laterality Date  . CHEST TUBE INSERTION Right 09/29/2018   Procedure: CHEST TUBE REPLACEMENT;  Surgeon: Melrose Nakayama, MD;  Location: [place];  Service: Thoracic;  Laterality: Right;  . INSERTION OF IBV VALVE N/A 09/19/2018   Procedure: INSERTION OF INTERBRONCHIAL VALVE (IBV);  Surgeon: Melrose Nakayama, MD;  Location: [facility] OR;  Service: Thoracic;  Laterality: N/A;  . INSERTION OF IBV VALVE N/A 12/01/2018   Procedure: REMOVAL OF INTERBRONCHIAL VALVE (IBV);  Surgeon: Melrose Nakayama, MD;  Location: [facility] OR;  Service: Thoracic;  Laterality: N/A;  . left VATS  2012   thoracotomy and LUL apical posterior segmentectomy  . STAPLING OF BLEBS Right 09/12/2018   Procedure: STAPLING OF BLEBS, right lower and middle lung lobes;  Surgeon: Melrose Nakayama, MD;  Location: [place];  Service: Thoracic;  Laterality: Right;  Marland Kitchen VIDEO ASSISTED THORACOSCOPY Right 09/12/2018   Procedure: VIDEO ASSISTED THORACOSCOPY, right lung;  Surgeon: Melrose Nakayama, MD;  Location: [place];  Service: Thoracic;  Laterality: Right;  Marland Kitchen VIDEO BRONCHOSCOPY Bilateral 10/20/2015   Procedure: VIDEO BRONCHOSCOPY WITHOUT FLUORO;  Surgeon:  Marshell Garfinkel, MD;  Location: [place];  Service: Cardiopulmonary;  Laterality: Bilateral;  . VIDEO BRONCHOSCOPY N/A 09/19/2018   Procedure: VIDEO BRONCHOSCOPY;  Surgeon: Melrose Nakayama, MD;  Location: [facility] OR;  Service: Thoracic;  Laterality: N/A;  . VIDEO BRONCHOSCOPY N/A 12/01/2018   Procedure: VIDEO BRONCHOSCOPY;  Surgeon: Melrose Nakayama, MD;  Location: [place];  Service: Thoracic;  Laterality: N/A;  . VIDEO BRONCHOSCOPY WITH INSERTION OF INTERBRONCHIAL VALVE (IBV) N/A 09/29/2018   Procedure: VIDEO BRONCHOSCOPY WITH INSERTION OF INTERBRONCHIAL VALVE  (IBV);  Surgeon: Melrose Nakayama, MD;  Location: [place];  Service: Thoracic;  Laterality: N/A;   Family History  Adopted: Yes   Social History   Tobacco Use  . Smoking status: Former Smoker    Packs/day: 0.70    Years: 19.00    Pack years: 13.30    Types: Cigarettes    Quit date: 11/20/2007    Years since quitting: 13.2  . Smokeless tobacco: Former Systems developer    Types: Snuff    Quit date: 11/19/1994  Vaping Use  . Vaping Use: Never used  Substance Use Topics  . Alcohol use: No  . Drug use: No    Allergies  Allergen Reactions  . Clarithromycin Other (See Comments)    Adverse reaction w/ voriconazole UNSPECIFIED REACTION   . Rifaximin Other (See Comments)    Adverse reaction w/ voriconazole UNSPECIFIED REACTION     Current Outpatient Medications:  .  albuterol (PROVENTIL) (2.5 MG/3ML) 0.083% nebulizer solution, Take 3 mLs (2.5 mg total) by nebulization every 6 (six) hours as needed for wheezing or shortness of breath., Disp: 75 mL, Rfl: 12 .  albuterol (VENTOLIN HFA) 108 (90 Base) MCG/ACT inhaler, Inhale 2 puffs into the lungs every 6 (six) hours as needed., Disp: 18 g, Rfl: 2 .  azithromycin (ZITHROMAX) 500 MG tablet, Take 1 tablet by mouth once daily, Disp: 30 tablet, Rfl: 5 .  BREZTRI AEROSPHERE 160-9-4.8 MCG/ACT AERO, INHALE 2 PUFFS TWICE DAILY, Disp: 11 g, Rfl: 1 .  Calcium Carbonate-Vitamin D 600-400 MG-UNIT  tablet, Take 1 tablet by mouth daily. , Disp: , Rfl:  .  CRESEMBA 186 MG CAPS, TAKE 2 CAPSULES BY MOUTH DAILY, Disp: 56 capsule, Rfl: 5 .  ethambutol (MYAMBUTOL) 400 MG tablet, TAKE 2 AND 1/2 TABLETS BY MOUTH ONCE DAILY, Disp: 598 tablet, Rfl: 5 .  feeding supplement, ENSURE ENLIVE, (ENSURE ENLIVE) LIQD, Take 237 mLs by mouth 3 (three) times daily between meals. (Patient taking differently: Take 237 mLs by mouth 2 (two) times daily between meals.), Disp: 237 mL, Rfl: 12 .  FEROSUL 325 (65 Fe) MG tablet, Take 325 mg by mouth every morning., Disp: , Rfl:  .  folic acid (FOLVITE) 1 MG tablet, Take 1 mg by mouth daily., Disp: , Rfl:  .  guaiFENesin (MUCINEX) 600 MG 12 hr tablet, Take 1 tablet (600 mg total) by mouth 2 (two) times daily., Disp: 30 tablet, Rfl: 0 .  mirtazapine (REMERON SOL-TAB) 15 MG disintegrating tablet, Take by mouth., Disp: , Rfl:  .  omalizumab (XOLAIR) 150 MG/ML prefilled syringe, Inject 300 mg into the skin every 14 (fourteen) days., Disp: 4 mL, Rfl: 11 .  omalizumab (XOLAIR) 75 MG/0.5ML prefilled syringe, Inject 75 mg into the skin every 14 (fourteen) days., Disp: 1 mL, Rfl: 11 .  predniSONE (DELTASONE) 10 MG tablet, Take 10 mg by mouth daily with breakfast., Disp: , Rfl:  .  EPINEPHrine 0.3 mg/0.3 mL IJ SOAJ injection, Inject 0.3 mg into the muscle once for 1 dose., Disp: 1 each, Rfl: 11 .  levothyroxine (SYNTHROID) 100 MCG tablet, Take 100 mcg by mouth daily., Disp: , Rfl:  .  levothyroxine (SYNTHROID) 88 MCG tablet, Take 100 mcg by mouth daily.  (Patient not taking: Reported on 02/08/2021), Disp: , Rfl:     Review of Systems  Constitutional: Positive for fatigue. Negative for activity change, appetite change, chills, diaphoresis and fever.  HENT: Negative for congestion, rhinorrhea, sinus pressure, sneezing, sore throat and trouble swallowing.   Eyes: Negative for photophobia and visual disturbance.  Respiratory: Positive for cough and shortness  of breath. Negative for  apnea, chest tightness, wheezing and stridor.   Cardiovascular: Negative for chest pain, palpitations and leg swelling.  Gastrointestinal: Negative for abdominal pain, anal bleeding, blood in stool, constipation, diarrhea, nausea and vomiting.  Genitourinary: Negative for difficulty urinating, dysuria, flank pain and hematuria.  Musculoskeletal: Negative for arthralgias, back pain, gait problem, joint swelling and myalgias.  Skin: Negative for color change, pallor and wound.  Neurological: Negative for dizziness, tremors, weakness and light-headedness.  Hematological: Negative for adenopathy. Does not bruise/bleed easily.  Psychiatric/Behavioral: Negative for agitation, behavioral problems, confusion, decreased concentration and sleep disturbance.       Objective:   Physical Exam Constitutional:      General: [name] is not in acute distress.    Appearance: [name] is not diaphoretic.  HENT:     Head: Normocephalic and atraumatic.  Eyes:     General: No scleral icterus.    Conjunctiva/sclera: Conjunctivae normal.  Cardiovascular:     Rate and Rhythm: Normal rate and regular rhythm.     Heart sounds: Normal heart sounds. No murmur heard. No friction rub. No gallop.   Pulmonary:     Effort: Prolonged expiration present. No respiratory distress.     Breath sounds: Examination of the right-lower field reveals decreased breath sounds. Decreased breath sounds present.  Abdominal:     General: There is no distension.     Palpations: Abdomen is soft.     Tenderness: There is no abdominal tenderness. There is no rebound.  Musculoskeletal:        General: No tenderness.     Cervical back: Normal range of motion and neck supple.  Lymphadenopathy:     Cervical: No cervical adenopathy.  Skin:    General: Skin is warm and dry.     Coloration: Skin is not pale.     Findings: No erythema.       Neurological:     General: No focal deficit present.     Mental Status: [name] is alert and oriented to  person, place, and time.     Sensory: Sensory deficit:      Motor: No abnormal muscle tone.     Coordination: Coordination normal.  Psychiatric:        Mood and Affect: Mood normal.        Behavior: Behavior normal.        Thought Content: Thought content normal.        Judgment: Judgment normal.          Assessment & Plan:    Aspergilloma: continue lifelong antifungal therapy: currently on CRESEMBA.   Allergic bronchopulmonary aspgergillosis:   Followed by pulmonary again on Cresemba  Sun exposed rash from voriconozole: We changed to Cresemba and it seems improved   Mycobacterium avium pulmonary infection: Continue current medications last AFB sputum was negative  I have suggested [name] discuss further with pulmonary other techniques to get the sputum out of his lungs.  Perhaps also resources associate with cystic fibrosis support networks would be helpful for him.    ?  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   Chronic hepatitis B without hepaticcoma:  [name] does not meet criteria for treatment. Will screen him for [facility] bi-annually  I expect [name] probably acquired hepatitis B perinatally.  Rib fracture: Was precipitated by his  vest in the context of osteoporosis.  COVID 19 engine [name] has had 4 doses of vaccine.  I spent greater than 40 minutes with the patient including greater than 50% of time in face to face counsel of the patient darting his aspergillosis aspergilloma mycobacterial faction bronchiectasis rash rib fracture and coordination of his care and in coordination of their care.

## 2021-02-08 NOTE — Telephone Encounter (Signed)
Submitted a Prior Authorization request to [employer] for [employer] PEN via Cover My Meds. Will update once we receive a response.   (Key: [id number] - [id number]

## 2021-02-08 NOTE — Patient Instructions (Signed)
Your Xolair and [name] will come from [facility]. Their phone number is : [phone]  Your Xolair will be due on 02/22/21, 4/20 then every 14 days thereafter  Your [name] is next due 03/15/21 then every 8 weeks thereafter. SET A REMINDER IN YOUR PHONE/CALENDAR.  Remember the 5 C's:  COUNTER - leave on the counter at least 30 minutes but up to overnight to bring medication to room temperature. This may help prevent stinging  COLD - place something cold (like an ice gel pack or cold water bottle) on the injection site just before cleansing with alcohol. This may help reduce pain  CLARITIN - use Claritin (generic name is loratadine) for the first two weeks of treatment or the day of, the day before, and the day after injecting. This will help to minimize injection site reactions  CORTISONE CREAM - apply if injection site is irritated and itching  CALL ME - if injection site reaction is bigger than the size of your fist, looks infected, blisters, or if you develop hives

## 2021-02-08 NOTE — Telephone Encounter (Signed)
Patient trained for [facility] auto-injector self-administration at clinic visit with pharmacy team on 02/08/21. At this visit, patient self-administered Xolair 359m as 3 separate injections.  Patient's next FBerna Bueis due 03/15/21. Appointment at infusion center is cancelled and patient will self-administer. [facility]

## 2021-02-08 NOTE — Telephone Encounter (Signed)
Received notification from [facility] regarding a prior authorization for [facility] PEN. Authorization has been APPROVED from 02/08/21 to 02/08/22.   Authorization # [id number]  Long- [id number] ID- [id number] [id number]

## 2021-02-08 NOTE — Progress Notes (Signed)
HPI Patient presents today to [facility] to see pharmacy team for transition to self-administration for [place] and [place]. [name] is not due today so he was trained on auto-injector.  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 Seen by Dr. Tommy Medal at [facility] - on lifelong antifungal therapy with Cresemba.  Patient was approved today for [facility] through pharmacy benefit. Xolair Josem Kaufmann is still in place for self-admin. After confirmation from [name] - we will move forward with patient self-administering both medications at home.   Last received Fasenra on 01/18/21.  Respiratory Medications Current: Breztri 186mg (2 puffs twice daily) Patient reports adherence.  OBJECTIVE Allergies  Allergen Reactions  . Clarithromycin Other (See Comments)    Adverse reaction w/ voriconazole UNSPECIFIED REACTION   . Rifaximin Other (See Comments)    Adverse reaction w/ voriconazole UNSPECIFIED REACTION     Outpatient Encounter Medications as of 02/08/2021  Medication Sig  . albuterol (PROVENTIL) (2.5 MG/3ML) 0.083% nebulizer solution Take 3 mLs (2.5 mg total) by nebulization every 6 (six) hours as needed for wheezing or shortness of breath.  .Marland Kitchenalbuterol (VENTOLIN HFA) 108 (90 Base) MCG/ACT inhaler Inhale 2 puffs into the lungs every 6 (six) hours as needed.  .Marland Kitchenazithromycin (ZITHROMAX) 500 MG tablet Take 1 tablet by mouth once daily  . BREZTRI AEROSPHERE 160-9-4.8 MCG/ACT AERO INHALE 2 PUFFS TWICE DAILY  . Calcium Carbonate-Vitamin D 600-400 MG-UNIT tablet Take 1 tablet by mouth daily.   .Marland KitchenCRESEMBA 186 MG CAPS TAKE 2 CAPSULES BY MOUTH DAILY  . EPINEPHrine 0.3 mg/0.3 mL IJ SOAJ injection Inject 0.3 mg into the muscle once for 1 dose.  . ethambutol (MYAMBUTOL) 400 MG tablet TAKE 2 AND 1/2 TABLETS BY MOUTH ONCE DAILY  . feeding supplement, ENSURE ENLIVE, (ENSURE ENLIVE) LIQD Take 237 mLs by mouth 3 (three) times daily between meals. (Patient  taking differently: Take 237 mLs by mouth 2 (two) times daily between meals.)  . FEROSUL 325 (65 Fe) MG tablet Take 325 mg by mouth every morning.  . folic acid (FOLVITE) 1 MG tablet Take 1 mg by mouth daily.  .Marland KitchenguaiFENesin (MUCINEX) 600 MG 12 hr tablet Take 1 tablet (600 mg total) by mouth 2 (two) times daily.  .Marland Kitchenlevothyroxine (SYNTHROID) 88 MCG tablet Take 100 mcg by mouth daily.   . mirtazapine (REMERON SOL-TAB) 15 MG disintegrating tablet Take by mouth.  .Marland Kitchenomalizumab (Arvid Right 150 MG/ML prefilled syringe Inject 300 mg into the skin every 14 (fourteen) days.  .Marland Kitchenomalizumab (Arvid Right 75 MG/0.5ML prefilled syringe Inject 75 mg into the skin every 14 (fourteen) days.  . predniSONE (DELTASONE) 10 MG tablet Take 10 mg by mouth daily with breakfast.   No facility-administered encounter medications on file as of 02/08/2021.     Immunization History  Administered Date(s) Administered  . DTaP 11/19/2010  . Influenza Split 10/29/2011, 08/14/2012  . Influenza Whole 09/19/2010  . Influenza,inj,Quad PF,6+ Mos 11/23/2013, 09/28/2014, 09/06/2015, 08/27/2016, 08/10/2017, 09/09/2018, 08/06/2019, 08/10/2020  . PFIZER Comirnaty(Gray Top)Covid-19 Tri-Sucrose Vaccine 12/28/2020  . PFIZER(Purple Top)SARS-COV-2 Vaccination 02/18/2020, 03/14/2020, 08/10/2020  . Pneumococcal Conjugate-13 04/29/2017, 04/29/2017  . Pneumococcal Polysaccharide-23 12/17/2010     PFTs PFT Results Latest Ref Rng & Units 11/30/2020 06/25/2017 08/06/2016  FVC-Pre L 2.09 2.52 2.84  FVC-Predicted Pre % 47 50 61  FVC-Post L 2.16 - 2.93  FVC-Predicted Post % 49 - 63  Pre FEV1/FVC % % 56 46 53  Post FEV1/FCV % % 56 - 56  FEV1-Pre L  1.17 1.16 1.50  FEV1-Predicted Pre % 33 28 39  FEV1-Post L 1.22 - 1.63  DLCO uncorrected ml/min/mmHg 15.81 - 18.56  DLCO UNC% % 53 - 59  DLCO corrected ml/min/mmHg 16.67 - 18.35  DLCO COR %Predicted % 56 - 59  DLVA Predicted % 123 - 108  TLC L - - 5.14  TLC % Predicted % - - 76  RV % Predicted % - - 129       Assessment   1. Biologics training ( . Omalizumab (Xolair) o MOA: IgG monoclonal antibody (recombinant DNA derived) which inhibits IgE binding to the high-affinity IgE receptor on mast cells and basophils.  o Response to therapy: ??? o Side effects: Anaphylaxis (0.1%) (black boxed warning), injection site reaction (45%), arthralgia (2.9% to 8%), headache (3% to 15%) o Dosing: SubQ every 2-4 weeks based on weight and pretreatment serum IgE o Administration:  1. Doses >150 mg should be divided over more than one injection site (eg, 225 mg or 300 mg administered as two injections, 375 mg administered as three injections) 2.  Do not inject into moles, scars, bruises, tender areas, or broken skin. 3. Injections may take 5 to 10 seconds to administer (solution is slightly viscous). 4. Administer only under direct medical supervision and observe patient for 2 hours after the first 3 injections and 30 minutes after subsequent injections or in accordance with individual institution policies and procedures 5. Recommended injection sites include the upper arm and the front and middle of the thighs. Maryjean Ka [name]) o MOA: A humanized afucosylated, monoclonal antibody (IgG1, kappa) that binds to the alpha subunit of the interleukin-5 receptor. IL-5 is the major cytokine responsible for the growth and differentiation, recruitment, activation, and survival of eosinophils (a cell type associated with inflammation and an important component in the pathogenesis of asthma). o Response to therapy: ??? o Side effects: antibody development (13%), headache (8%), pharyngitis (5%), injection site reaction (2.2%) o Dosing: 30 mg every 4 weeks for the first 3 doses, followed by once every 8 weeks thereafter. o Administration:  1. Administer SubQ into the upper arm, thigh, or abdomen.  2. Solution is clear to opalescent, colorless to slightly yellow, and may contain a few translucent or white to off-white  particles; do not use if cloudy, discolored, or containing large particles  Patient self-administered in the left and right lower abdomen using appropriate admin technique - 45* angle. Xolair 187m/mL PFS x 2 NDC: [address]-01 Lot: [id number]: 09/2021  Xolair 720m0.5mL PFS(sample [facility] as [id number]: [id number]: 01/2021  2. Medication Reconciliation  A drug regimen assessment was performed, including review of allergies, interactions, disease-state management, dosing and immunization history. Medications were reviewed with the patient, including name, instructions, indication, goals of therapy, potential side effects, importance of adherence, and safe use.  Drug interaction(s):  None identified  3. Immunizations  Patient has received 4 COVID19 vaccine immunizations  He is UTD on all vaccines  PLAN - Continue Xolair 3757mubcut as 3 separate injections every 14 days.   Patient provided with dose that is due in 2 weeks (2 x 150m13me-filled syring and 1x 75mg24m-filled syringe) - Continue Fasenra 30mg 8mu every 8 weeks. Next Fasenra due 03/15/21. [facility] - Continue Breztri as prescribed. - Patient provided with 2022 calendar marked with two separate colors with when FasenrBerna Bueolair are due. [facility]'s [facility]  All questions encouraged and answered.  Instructed patient to reach out with any further questions or concerns.  Thank you for allowing pharmacy to participate in this patient's care.  Knox Saliva, PharmD, MPH Clinical Pharmacist (Rheumatology and Pulmonology)

## 2021-02-09 DIAGNOSIS — J479 Bronchiectasis, uncomplicated: Secondary | ICD-10-CM | POA: Diagnosis not present

## 2021-02-10 ENCOUNTER — Other Ambulatory Visit (HOSPITAL_COMMUNITY): Payer: Self-pay

## 2021-02-14 ENCOUNTER — Telehealth: Payer: Self-pay | Admitting: Internal Medicine

## 2021-02-14 NOTE — Telephone Encounter (Signed)
ONO  <= 88% on RA 01/12/21 : 14 min and 4 sec  Plan   -s tart 2L [place] at night

## 2021-02-15 DIAGNOSIS — R195 Other fecal abnormalities: Secondary | ICD-10-CM | POA: Diagnosis not present

## 2021-02-15 DIAGNOSIS — K5730 Diverticulosis of large intestine without perforation or abscess without bleeding: Secondary | ICD-10-CM | POA: Diagnosis not present

## 2021-02-15 DIAGNOSIS — K648 Other hemorrhoids: Secondary | ICD-10-CM | POA: Diagnosis not present

## 2021-02-15 DIAGNOSIS — D125 Benign neoplasm of sigmoid colon: Secondary | ICD-10-CM | POA: Diagnosis not present

## 2021-02-15 DIAGNOSIS — D509 Iron deficiency anemia, unspecified: Secondary | ICD-10-CM | POA: Diagnosis not present

## 2021-02-15 DIAGNOSIS — K449 Diaphragmatic hernia without obstruction or gangrene: Secondary | ICD-10-CM | POA: Diagnosis not present

## 2021-02-16 NOTE — Telephone Encounter (Signed)
Results are now past the 30 day mark but pt already has O2 at home as he was qualified for O2 at last office visit.  Called and spoke with pt letting him know the results of ONO and stated to him that he did drop less than or equal to 88% for 14 minutes which per MR, does qualify him to wear O2 at 2L at night.  Pt verbalized understanding and stated at this time he feels okay not wearing O2 at night at this time but states if he wakes up in the middle of the night feeling like he cannot breathe, he will put the O2 on. Nothing further needed.

## 2021-02-18 ENCOUNTER — Other Ambulatory Visit (HOSPITAL_COMMUNITY): Payer: Self-pay

## 2021-02-21 ENCOUNTER — Other Ambulatory Visit: Payer: Self-pay

## 2021-02-21 ENCOUNTER — Ambulatory Visit (INDEPENDENT_AMBULATORY_CARE_PROVIDER_SITE_OTHER): Payer: BC Managed Care – PPO

## 2021-02-21 ENCOUNTER — Ambulatory Visit: Payer: BC Managed Care – PPO | Admitting: Adult Health

## 2021-02-21 ENCOUNTER — Encounter: Payer: Self-pay | Admitting: Adult Health

## 2021-02-21 VITALS — BP 120/76 | HR 83 | Temp 98.3°F | Ht 68.0 in | Wt 117.0 lb

## 2021-02-21 DIAGNOSIS — R059 Cough, unspecified: Secondary | ICD-10-CM | POA: Diagnosis not present

## 2021-02-21 DIAGNOSIS — B4481 Allergic bronchopulmonary aspergillosis: Secondary | ICD-10-CM | POA: Diagnosis not present

## 2021-02-21 DIAGNOSIS — B449 Aspergillosis, unspecified: Secondary | ICD-10-CM | POA: Diagnosis not present

## 2021-02-21 DIAGNOSIS — J4551 Severe persistent asthma with (acute) exacerbation: Secondary | ICD-10-CM | POA: Diagnosis not present

## 2021-02-21 DIAGNOSIS — R0600 Dyspnea, unspecified: Secondary | ICD-10-CM | POA: Diagnosis not present

## 2021-02-21 DIAGNOSIS — J479 Bronchiectasis, uncomplicated: Secondary | ICD-10-CM

## 2021-02-21 DIAGNOSIS — J439 Emphysema, unspecified: Secondary | ICD-10-CM | POA: Diagnosis not present

## 2021-02-21 DIAGNOSIS — J9611 Chronic respiratory failure with hypoxia: Secondary | ICD-10-CM

## 2021-02-21 MED ORDER — PREDNISONE 10 MG PO TABS: 10.0000 mg | ORAL_TABLET | Freq: Every day | ORAL | 3 refills | Status: DC

## 2021-02-21 MED ORDER — LIDOCAINE 5 % EX PTCH: 1.0000 | MEDICATED_PATCH | Freq: Every day | CUTANEOUS | 0 refills | Status: DC | PRN

## 2021-02-21 MED ORDER — METHYLPREDNISOLONE ACETATE 80 MG/ML IJ SUSP
80.0000 mg | Freq: Once | INTRAMUSCULAR | Status: AC
  Administered 2021-02-21: 80 mg via INTRAMUSCULAR

## 2021-02-21 MED ORDER — DOXYCYCLINE HYCLATE 100 MG PO TABS: 100.0000 mg | ORAL_TABLET | Freq: Two times a day (BID) | ORAL | 0 refills | Status: DC

## 2021-02-21 MED ORDER — PREDNISONE 20 MG PO TABS: 20.0000 mg | ORAL_TABLET | Freq: Every day | ORAL | 0 refills | Status: DC

## 2021-02-21 NOTE — Assessment & Plan Note (Signed)
Continue on current regimen .   

## 2021-02-21 NOTE — Assessment & Plan Note (Signed)
Cont on Prednisone 51m daily

## 2021-02-21 NOTE — Progress Notes (Signed)
_0  ID: [name], male    DOB: [birth year], 45 y.o.   MRN: [id number]  Chief Complaint  Patient presents with  . 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, MAC, aspergilloma History of recurrent hemoptysis with severe episode in 2016 required interventional guided embolization History of aspergilloma status post VATS in 2012 on lifelong antifungal therapy followed by infectious disease ABPA on chronic steroids with prednisone 10 mg daily MAC followed by ID on ethambutol and azithromycin History of tension pneumothorax in October 2019  TEST/EVENTS :  7/13 Ig E 800s(~4000 2012 ) 12/10/11 Sputum AFB Smear and culture negative (prelim) 12/10/11 Fungal sputum culture - negative (final) 01/30/2012 spirometry - fev1 1.7L/41%, ratip 52 - BEST EVER 01/30/2012 walking desaturation test: 185 feet x 3 laps: did not deaturate 05/2014 >Spiro fev1 1.7L/40%, ratio 55 Spirometry 06/25/2017 showed FEV1 at 28%, ratio 46, FVC 50%. CT chest July 2021 showed chronic narrowing and occlusion of several of the right upper lobe segmental and subsegmental pulmonary arteries additional filling defects in the left upper lobe segmental and subsegmental branches. Extensive bullous disease. Architectural distortion most suggestive of chronic embolus/occlusion, right atrial enlargement , Groundglass and consolidative opacity in the left lower and right lower periphery  Sputum culture July 2021 + for Klebsiella pneumonia-pansensitive except for ampicillin.  August 20212D echo showed EF 51%, grade 1 diastolic dysfunction mildly elevated pulmonary artery systolic pressure at 39 mmHg  Events  2010 Dx with MAI >Azithro/Ethambutol 2012 Dx with Aspergilloma >VATS >started on Voriconazaol  2012 Dx w/ ABPA w/ High IgE >4000>started on prednisone  2013 tried off azithr/Etham but developed fever, Cx  neg but restarted on rx.  2016 Hospitalzed with hemoptysis >IR guided embolization . FOB/BAL + Saccharomyces on fungal fx . Serum Galactomannan was neg.  [name] evaluation 04/2017 for Transplant -rejected. Felt to early for transplant- recommended pre-transplant weight goal 128 lbs for a BMI > 19  02/21/2021 Follow up : Asthma , ABPA , Aspergilloma , MAC Patient returns for a 2-week follow-up.  Last visit patient was having ongoing rib pain and was unable to use his vest therapy.  Chest x-ray showed a chronic fracture of the left fourth and fifth rib.  Along with chronic bullous emphysematous changes. Patient was recommended to remain off of vest therapy.  And supportive care for his rib pain. Recent overnight oximetry test had showed desaturations.  Patient was recommended to start on 2 L of oxygen unfortunately insurance declined coverage due to greater than 30 days out for order to be instituted. Since last visit patient says that he is not doing well.  Breathing has deteriorated.  He has increased shortness of breath and decreased activity tolerance.  He also has ongoing and increased pain along the left rib cage.  Also has noticed that his oxygen levels have been dropping at home with activity.  Today in the office walk test shows O2 saturations at 88 % on room. Discussed continuing with oxygen with activity to keep sats >88-90% . Recent ONO showed nocturnal desats.  Cough has increased with thick mucus. Chest xray today reviewed with chronic changes.     Allergies  Allergen Reactions  . Clarithromycin Other (See Comments)    Adverse reaction w/ voriconazole UNSPECIFIED REACTION   . Rifaximin Other (See Comments)    Adverse reaction w/ voriconazole UNSPECIFIED REACTION     Immunization History  Administered Date(s) Administered  .  DTaP 11/19/2010  . Influenza Split 10/29/2011, 08/14/2012  . Influenza Whole 09/19/2010  . Influenza,inj,Quad PF,6+ Mos 11/23/2013, 09/28/2014, 09/06/2015,  08/27/2016, 08/10/2017, 09/09/2018, 08/06/2019, 08/10/2020  . PFIZER Comirnaty(Gray Top)Covid-19 Tri-Sucrose Vaccine 12/28/2020  . PFIZER(Purple Top)SARS-COV-2 Vaccination 02/18/2020, 03/14/2020, 08/10/2020  . Pneumococcal Conjugate-13 04/29/2017, 04/29/2017  . Pneumococcal Polysaccharide-23 12/17/2010    Past Medical History:  Diagnosis Date  . Anemia   . 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[place])   . Chronic viral hepatitis B without delta-agent ([place]) 11/23/2020  . Drug rash 12/16/2017  . Dyspnea   . Esophageal reflux    very rare  . 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 Lung disease, bullous ([place])   . MAI (mycobacterium avium-intracellulare) ([place])   . Mold exposure 12/05/2015  . 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 Rib fracture 02/08/2021  . Solar lentigo 08/08/2015  . Vaccine counseling 12/28/2020  . Weight loss 10/15/2016    Tobacco History: Social History   Tobacco Use  Smoking Status Former Smoker  . Packs/day: 0.70  . Years: 19.00  . Pack years: 13.30  . Types: Cigarettes  . Quit date: 11/20/2007  . Years since quitting: 13.2  Smokeless Tobacco Former Systems developer  . Types: Snuff  . Quit date: 11/19/1994   Counseling given: Not Answered   Outpatient Medications Prior to Visit  Medication Sig Dispense Refill  . albuterol (PROVENTIL) (2.5 MG/3ML) 0.083% nebulizer solution Take 3 mLs (2.5 mg total) by nebulization every 6 (six) hours as needed for wheezing or shortness of breath. 75 mL 12  . albuterol (VENTOLIN HFA) 108 (90 Base) MCG/ACT inhaler Inhale 2 puffs into the lungs every 6 (six) hours as needed. 18 g 2  . Benralizumab (FASENRA PEN) 30 MG/ML SOAJ Inject 1 mL (30 mg total) into the skin every 8 (eight) weeks. Deliver to patient's home. Patient is next due 03/15/21 1 mL 2  . BREZTRI AEROSPHERE 160-9-4.8 MCG/ACT AERO INHALE 2 PUFFS TWICE DAILY 11 g 1  . Calcium Carbonate-Vitamin D 600-400 MG-UNIT tablet Take 1 tablet by mouth daily.     Marland Kitchen ethambutol (MYAMBUTOL) 400 MG  tablet TAKE 2 AND 1/2 TABLETS BY MOUTH ONCE DAILY 598 tablet 5  . feeding supplement, ENSURE ENLIVE, (ENSURE ENLIVE) LIQD Take 237 mLs by mouth 3 (three) times daily between meals. (Patient taking differently: Take 237 mLs by mouth 2 (two) times daily between meals.) 237 mL 12  . FEROSUL 325 (65 Fe) MG tablet Take 325 mg by mouth every morning.    . folic acid (FOLVITE) 1 MG tablet Take 1 mg by mouth daily.    Marland Kitchen guaiFENesin (MUCINEX) 600 MG 12 hr tablet Take 1 tablet (600 mg total) by mouth 2 (two) times daily. 30 tablet 0  . Isavuconazonium Sulfate 186 MG CAPS TAKE 2 CAPSULES BY MOUTH DAILY 56 capsule 5  . levothyroxine (SYNTHROID) 100 MCG tablet Take 100 mcg by mouth daily.    . mirtazapine (REMERON SOL-TAB) 15 MG disintegrating tablet Take by mouth.    Marland Kitchen omalizumab (XOLAIR) 150 MG/ML prefilled syringe INJECT 300 MG INTO THE SKIN EVERY 14 (FOURTEEN) DAYS. IN ADDITION TO 75MG (TOTAL DOSE IS 375MG EVERY 14 DAYS). 4 mL 11  . omalizumab (XOLAIR) 75 MG/0.5ML prefilled syringe INJECT 75 MG INTO THE SKIN EVERY 14 (FOURTEEN) DAYS. IN ADDITION TO 300MG (TOTAL DOSE IS 375MG EVERY 14 DAYS). DELIVER TO PATIENT'S HOME. 1 mL 11  . COVID-19 mRNA Vac-TriS, Pfizer, SUSP injection USE AS DIRECTED .3 mL 0  . predniSONE (DELTASONE) 10  MG tablet Take 10 mg by mouth daily with breakfast.    . azithromycin (ZITHROMAX) 500 MG tablet Take 1 tablet by mouth once daily (Patient not taking: Reported on 02/21/2021) 30 tablet 5  . EPINEPHrine 0.3 mg/0.3 mL IJ SOAJ injection Inject 0.3 mg into the muscle once for 1 dose. 1 each 11  . levothyroxine (SYNTHROID) 88 MCG tablet Take 100 mcg by mouth daily. (Patient not taking: Reported on 02/21/2021)     No facility-administered medications prior to visit.     Review of Systems:   Constitutional:   No  weight loss, night sweats,  Fevers, chills,  +fatigue, or  lassitude.  HEENT:   No headaches,  Difficulty swallowing,  Tooth/dental problems, or  Sore throat,                No  sneezing, itching, ear ache, nasal congestion, post nasal drip,   CV:  No chest pain,  Orthopnea, PND, swelling in lower extremities, anasarca, dizziness, palpitations, syncope.   GI  No heartburn, indigestion, abdominal pain, nausea, vomiting, diarrhea, change in bowel habits, loss of appetite, bloody stools.   Resp:   No chest wall deformity  Skin: no rash or lesions.  GU: no dysuria, change in color of urine, no urgency or frequency.  No flank pain, no hematuria   MS:  No joint pain or swelling.  No decreased range of motion.  No back pain.    Physical Exam  BP 120/76 (BP Location: Left Arm, Cuff Size: Normal)   Pulse 83   Temp 98.3 F (36.8 C)   Ht _0  (1.727 m)   Wt 117 lb (53.1 kg)   SpO2 95%   BMI 17.79 kg/m   GEN: A/Ox3; pleasant , NAD, cachexic appearing    HEENT:  [place]/AT,    NOSE-clear, THROAT-clear, no lesions, no postnasal drip or exudate noted.   NECK:  Supple w/ fair ROM; no JVD; normal carotid impulses w/o bruits; no thyromegaly or nodules palpated; no lymphadenopathy.    RESP  Scattered rhonchi with few exp wheezing , no accessory muscle use, no dullness to percussion  CARD:  RRR, no m/r/g, no peripheral edema, pulses intact, no cyanosis or clubbing.  GI:   Soft & nt; nml bowel sounds; no organomegaly or masses detected.   Musco: Warm bil, no deformities or joint swelling noted.   Neuro: alert, no focal deficits noted.    Skin: Warm, no lesions or rashes    Lab Results:  CBC    Component Value Date/Time   WBC 12.0 (H) 11/23/2020 1202   RBC 3.98 (L) 11/23/2020 1202   HGB 12.8 (L) 11/23/2020 1202   HCT 37.9 (L) 11/23/2020 1202   PLT 341 11/23/2020 1202   MCV 95.2 11/23/2020 1202   MCH 32.2 11/23/2020 1202   MCHC 33.8 11/23/2020 1202   RDW 13.3 11/23/2020 1202   LYMPHSABS 540 (L) 11/23/2020 1202   MONOABS 0.5 11/02/2020 1221   EOSABS 0 (L) 11/23/2020 1202   BASOSABS 0 11/23/2020 1202    BMET    Component Value Date/Time   NA 141  11/23/2020 1202   K 3.7 11/23/2020 1202   CL 102 11/23/2020 1202   CO2 31 11/23/2020 1202   GLUCOSE 86 11/23/2020 1202   BUN 12 11/23/2020 1202   CREATININE 0.80 11/23/2020 1202   CALCIUM 8.6 11/23/2020 1202   GFRNONAA 107 11/23/2020 1202   GFRAA 124 11/23/2020 1202    BNP No results found for: BNP  ProBNP    Component Value Date/Time   PROBNP 97.0 03/07/2011 0918    Imaging: DG Chest 2 View  Result Date: 02/21/2021 CLINICAL DATA:  Cough, dyspnea. EXAM: CHEST - 2 VIEW COMPARISON:  February 03, 2021. FINDINGS: The heart size and mediastinal contours are within normal limits. Stable extensive scarring and emphysematous bullous disease is noted in both upper lobes. Blunting of the costophrenic sulcus is noted bilaterally most likely due to scarring. No definite pneumothorax or effusion is noted. The visualized skeletal structures are unremarkable. IMPRESSION: Stable extensive scarring and emphysematous bullous disease is noted in both upper lobes. No definite acute abnormality is noted. Emphysema (ICD10-J43.9). Electronically Signed   By: Marijo Conception M.D.   On: 02/21/2021 16:45   DG Ribs Unilateral Left  Result Date: 02/06/2021 CLINICAL DATA:  Left rib pain for 5 days EXAM: LEFT RIBS - 2 VIEW COMPARISON:  10/12/2020 FINDINGS: Chronic fracture of the left fourth and fifth ribs. No acute fracture identified. Chronic bullous emphysematous changes of the left upper lobe. No pneumothorax. IMPRESSION: 1. No acute abnormality of the left ribs. 2. Chronic left fourth and fifth rib fractures. Electronically Signed   By: Davina Poke D.O.   On: 02/06/2021 09:30    Benralizumab SOSY 30 mg    Date Action Dose Route User   01/18/2021 1425 Given 30 mg Subcutaneous (Left Arm) Elton Sin, LPN    methylPREDNISolone acetate (DEPO-MEDROL) injection 80 mg    Date Action Dose Route User   02/21/2021 1711 Given 80 mg Intramuscular (Left Ventrogluteal) Birder Robson, RN    omalizumab Arvid Right)  prefilled syringe 300 mg    Date Action Dose Route User   12/28/2020 1415 Given 300 mg Subcutaneous (Right Arm) Elton Sin, LPN    omalizumab Arvid Right) prefilled syringe 300 mg    Date Action Dose Route User   01/11/2021 1515 Given 300 mg Subcutaneous (Left Arm) Elton Sin, LPN    omalizumab Arvid Right) prefilled syringe 300 mg    Date Action Dose Route User   01/25/2021 1454 Given 300 mg Subcutaneous (Right Arm) Elton Sin, LPN    omalizumab Arvid Right) prefilled syringe 75 mg    Date Action Dose Route User   12/28/2020 1415 Given 75 mg Subcutaneous (Right Arm) Elton Sin, LPN    omalizumab Arvid Right) prefilled syringe 75 mg    Date Action Dose Route User   01/11/2021 1516 Given 75 mg Subcutaneous (Right Arm) Elton Sin, LPN    omalizumab Arvid Right) prefilled syringe 75 mg    Date Action Dose Route User   01/25/2021 1455 Given 75 mg Subcutaneous (Left Arm) Elton Sin, LPN      PFT Results Latest Ref Rng & Units 11/30/2020 06/25/2017 08/06/2016  FVC-Pre L 2.09 2.52 2.84  FVC-Predicted Pre % 47 50 61  FVC-Post L 2.16 - 2.93  FVC-Predicted Post % 49 - 63  Pre FEV1/FVC % % 56 46 53  Post FEV1/FCV % % 56 - 56  FEV1-Pre L 1.17 1.16 1.50  FEV1-Predicted Pre % 33 28 39  FEV1-Post L 1.22 - 1.63  DLCO uncorrected ml/min/mmHg 15.81 - 18.56  DLCO UNC% % 53 - 59  DLCO corrected ml/min/mmHg 16.67 - 18.35  DLCO COR %Predicted % 56 - 59  DLVA Predicted % 123 - 108  TLC L - - 5.14  TLC % Predicted % - - 76  RV % Predicted % - - 129    No results found for: NITRICOXIDE  Assessment & Plan:   Severe persistent asthma Asthmatic Bronchitis flare - CXR with no acute findings  Treat with empiric abx and steroid burst  Check covid test   Plan  Patient Instructions  Covid test ,call if positive .  Remain off VEST therapy  Continue on BREZTRI 2 puffs Twice daily  , rinse after use.  Depo Medrol 74m IM injection today  Doxycycline 1051mTwice daily  For 7 days .   Increase Prednisone 206maily for 1 week then Prednisone 13m13mily  Flutter valve Three times a day  .  Please eat often,  High protein food and drinks.  Continue on Fasenra and Xolair .  Continue with ID follow up.  Continue on Oxygen 2l/m with activity .  Begin Oxygen 2l/m At bedtime  .  Order for refillable part for concentrator autofill.  Albuterol inhaler As needed  Wheezing/shortness of breath .  Albuterol neb every 6hrs as needed for wheezing /shortness of breath as needed.  Robitussin DM As needed  Cough/congestion  Follow up with Dr. RamaChase CallerParrett Farmer in 2 months and As needed   Please contact office for sooner follow up if symptoms do not improve or worsen or seek emergency care        ABPA (allergic bronchopulmonary aspergillosis) (HCC)Roscoent on Prednisone 13mg36mly   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HCC) Tazlinatinue on current regimen .   Chronic respiratory failure with hypoxia (HCC) Continue on Oxygen with act  Add O2 at bedtime for nocturnal desats   Plan  Patient Instructions  Covid test ,call if positive .  Remain off VEST therapy  Continue on BREZTRI 2 puffs Twice daily  , rinse after use.  Depo Medrol 80mg 11mnjection today  Doxycycline 100mg T91m daily  For 7 days .  Increase Prednisone 20mg da77mfor 1 week then Prednisone 13mg dai47mFlutter valve Three times a day  .  Please eat often,  High protein food and drinks.  Continue on Fasenra and Xolair .  Continue with ID follow up.  Continue on Oxygen 2l/m with activity .  Begin Oxygen 2l/m At bedtime  .  Order for refillable part for concentrator autofill.  Albuterol inhaler As needed  Wheezing/shortness of breath .  Albuterol neb every 6hrs as needed for wheezing /shortness of breath as needed.  Robitussin DM As needed  Cough/congestion  Follow up with Dr. RamaswamyChase Callertt Farmer in 2 months and As needed   Please contact office for sooner follow up if symptoms do not improve or worsen or seek emergency care      n      [name] ParRexene Edison2022

## 2021-02-21 NOTE — Patient Instructions (Addendum)
Covid test ,call if positive .  Remain off VEST therapy  Continue on BREZTRI 2 puffs Twice daily  , rinse after use.  Depo Medrol 49m IM injection today  Doxycycline 1074mTwice daily  For 7 days .  Increase Prednisone 2033maily for 1 week then Prednisone 75m32mily  Flutter valve Three times a day  .  Please eat often,  High protein food and drinks.  Continue on Fasenra and Xolair .  Continue with ID follow up.  Continue on Oxygen 2l/m with activity .  Begin Oxygen 2l/m At bedtime  .  Order for refillable part for concentrator autofill.  Albuterol inhaler As needed  Wheezing/shortness of breath .  Albuterol neb every 6hrs as needed for wheezing /shortness of breath as needed.  Robitussin DM As needed  Cough/congestion  Follow up with Dr. RamaChase CallerParrett NP in 2 months and As needed   Please contact office for sooner follow up if symptoms do not improve or worsen or seek emergency care

## 2021-02-21 NOTE — Assessment & Plan Note (Signed)
Asthmatic Bronchitis flare - CXR with no acute findings  Treat with empiric abx and steroid burst  Check covid test   Plan  Patient Instructions  Covid test ,call if positive .  Remain off VEST therapy  Continue on BREZTRI 2 puffs Twice daily  , rinse after use.  Depo Medrol 70m IM injection today  Doxycycline 1055mTwice daily  For 7 days .  Increase Prednisone 2022maily for 1 week then Prednisone 58m57mily  Flutter valve Three times a day  .  Please eat often,  High protein food and drinks.  Continue on Fasenra and Xolair .  Continue with ID follow up.  Continue on Oxygen 2l/m with activity .  Begin Oxygen 2l/m At bedtime  .  Order for refillable part for concentrator autofill.  Albuterol inhaler As needed  Wheezing/shortness of breath .  Albuterol neb every 6hrs as needed for wheezing /shortness of breath as needed.  Robitussin DM As needed  Cough/congestion  Follow up with Dr. RamaChase CallerParrett NP in 2 months and As needed   Please contact office for sooner follow up if symptoms do not improve or worsen or seek emergency care

## 2021-02-21 NOTE — Assessment & Plan Note (Signed)
Continue on Oxygen with act  Add O2 at bedtime for nocturnal desats   Plan  Patient Instructions  Covid test ,call if positive .  Remain off VEST therapy  Continue on BREZTRI 2 puffs Twice daily  , rinse after use.  Depo Medrol 6m IM injection today  Doxycycline 1066mTwice daily  For 7 days .  Increase Prednisone 2034maily for 1 week then Prednisone 2m30mily  Flutter valve Three times a day  .  Please eat often,  High protein food and drinks.  Continue on Fasenra and Xolair .  Continue with ID follow up.  Continue on Oxygen 2l/m with activity .  Begin Oxygen 2l/m At bedtime  .  Order for refillable part for concentrator autofill.  Albuterol inhaler As needed  Wheezing/shortness of breath .  Albuterol neb every 6hrs as needed for wheezing /shortness of breath as needed.  Robitussin DM As needed  Cough/congestion  Follow up with Dr. RamaChase CallerParrett NP in 2 months and As needed   Please contact office for sooner follow up if symptoms do not improve or worsen or seek emergency care     n

## 2021-02-22 ENCOUNTER — Telehealth: Payer: Self-pay | Admitting: Adult Health

## 2021-02-22 DIAGNOSIS — J479 Bronchiectasis, uncomplicated: Secondary | ICD-10-CM | POA: Diagnosis not present

## 2021-02-22 NOTE — Telephone Encounter (Signed)
PA request was received from (pharmacy): [facility] in [place] Phone: [phone] Fax: [phone] Medication name and strength: Lidocaine 5% patches Ordering Provider: TP  Was PA started with CMM?: Yes If yes, please enter KEY: [id number] Medication tried and failed:  Covered Alternatives:   PA sent to plan, time frame for approval / denial: 24 hours

## 2021-02-22 NOTE — Telephone Encounter (Signed)
Tammy, please see mychart message sent by pt.

## 2021-02-23 ENCOUNTER — Other Ambulatory Visit (HOSPITAL_COMMUNITY): Payer: Self-pay

## 2021-02-24 ENCOUNTER — Other Ambulatory Visit: Payer: Self-pay | Admitting: Adult Health

## 2021-02-24 ENCOUNTER — Other Ambulatory Visit (HOSPITAL_COMMUNITY): Payer: Self-pay

## 2021-02-24 MED FILL — Omalizumab Subcutaneous Soln Prefilled Syringe 75 MG/0.5ML: SUBCUTANEOUS | 28 days supply | Qty: 1 | Fill #0 | Status: AC

## 2021-02-24 MED FILL — Isavuconazonium Sulfate Cap 186 MG: ORAL | 28 days supply | Qty: 56 | Fill #0 | Status: AC

## 2021-02-24 MED FILL — Omalizumab Subcutaneous Soln Prefilled Syringe 150 MG/ML: SUBCUTANEOUS | 28 days supply | Qty: 4 | Fill #0 | Status: AC

## 2021-02-27 ENCOUNTER — Other Ambulatory Visit (HOSPITAL_COMMUNITY): Payer: Self-pay

## 2021-02-27 DIAGNOSIS — B4481 Allergic bronchopulmonary aspergillosis: Secondary | ICD-10-CM | POA: Diagnosis not present

## 2021-02-27 DIAGNOSIS — A312 Disseminated mycobacterium avium-intracellulare complex (DMAC): Secondary | ICD-10-CM | POA: Diagnosis not present

## 2021-02-27 DIAGNOSIS — B449 Aspergillosis, unspecified: Secondary | ICD-10-CM | POA: Diagnosis not present

## 2021-02-27 DIAGNOSIS — R0902 Hypoxemia: Secondary | ICD-10-CM | POA: Diagnosis not present

## 2021-02-28 ENCOUNTER — Other Ambulatory Visit (HOSPITAL_COMMUNITY): Payer: Self-pay

## 2021-02-28 NOTE — Telephone Encounter (Signed)
Sorry there is not alternative for this , may need to reach out to PCP for pain management is still having pain .

## 2021-02-28 NOTE — Telephone Encounter (Signed)
Checked on PA status today for the Lidocaine patches and these have been denied by his insurance. No alternative given.  Will forward to TP for further recs.  thanks

## 2021-02-28 NOTE — Telephone Encounter (Signed)
Called and spoke with patient. He stated that he was made aware of the denial by his pharmacy. The pharmacist attempted to find something that the insurance would cover but he was unsuccessful. He has been managing with SalonPas patches for now.   Advised him to let us know if anything changes. He verbalized understanding. Nothing further needed at time of call.

## 2021-03-01 ENCOUNTER — Other Ambulatory Visit (HOSPITAL_COMMUNITY): Payer: Self-pay

## 2021-03-02 ENCOUNTER — Other Ambulatory Visit (HOSPITAL_COMMUNITY): Payer: Self-pay

## 2021-03-02 DIAGNOSIS — J479 Bronchiectasis, uncomplicated: Secondary | ICD-10-CM | POA: Diagnosis not present

## 2021-03-03 ENCOUNTER — Other Ambulatory Visit (HOSPITAL_COMMUNITY): Payer: Self-pay

## 2021-03-08 ENCOUNTER — Other Ambulatory Visit (HOSPITAL_COMMUNITY): Payer: Self-pay

## 2021-03-12 DIAGNOSIS — J479 Bronchiectasis, uncomplicated: Secondary | ICD-10-CM | POA: Diagnosis not present

## 2021-03-24 DIAGNOSIS — J479 Bronchiectasis, uncomplicated: Secondary | ICD-10-CM | POA: Diagnosis not present

## 2021-03-27 ENCOUNTER — Other Ambulatory Visit (HOSPITAL_COMMUNITY): Payer: Self-pay

## 2021-03-27 MED FILL — Isavuconazonium Sulfate Cap 186 MG: ORAL | 28 days supply | Qty: 56 | Fill #1 | Status: AC

## 2021-03-28 ENCOUNTER — Other Ambulatory Visit (HOSPITAL_COMMUNITY): Payer: Self-pay

## 2021-03-29 ENCOUNTER — Other Ambulatory Visit (HOSPITAL_COMMUNITY): Payer: Self-pay

## 2021-03-29 DIAGNOSIS — B449 Aspergillosis, unspecified: Secondary | ICD-10-CM | POA: Diagnosis not present

## 2021-03-29 DIAGNOSIS — B4481 Allergic bronchopulmonary aspergillosis: Secondary | ICD-10-CM | POA: Diagnosis not present

## 2021-03-29 DIAGNOSIS — A312 Disseminated mycobacterium avium-intracellulare complex (DMAC): Secondary | ICD-10-CM | POA: Diagnosis not present

## 2021-03-29 DIAGNOSIS — R0902 Hypoxemia: Secondary | ICD-10-CM | POA: Diagnosis not present

## 2021-03-29 MED FILL — Omalizumab Subcutaneous Soln Prefilled Syringe 75 MG/0.5ML: SUBCUTANEOUS | 28 days supply | Qty: 1 | Fill #1 | Status: AC

## 2021-03-29 MED FILL — Omalizumab Subcutaneous Soln Prefilled Syringe 150 MG/ML: SUBCUTANEOUS | 28 days supply | Qty: 4 | Fill #1 | Status: AC

## 2021-03-30 ENCOUNTER — Other Ambulatory Visit (HOSPITAL_COMMUNITY): Payer: Self-pay

## 2021-04-01 DIAGNOSIS — J479 Bronchiectasis, uncomplicated: Secondary | ICD-10-CM | POA: Diagnosis not present

## 2021-04-10 ENCOUNTER — Telehealth: Payer: Self-pay

## 2021-04-10 NOTE — Telephone Encounter (Addendum)
Received fax stating PA for Xolair would expire soon. Per previous encounters, current PA is still active until 05/06/21. Will push out for f/u on a more appropriate date.  Previous CoverMyMeds Key: [id number]  F/U Key: [id number]

## 2021-04-11 DIAGNOSIS — J479 Bronchiectasis, uncomplicated: Secondary | ICD-10-CM | POA: Diagnosis not present

## 2021-04-23 ENCOUNTER — Other Ambulatory Visit: Payer: Self-pay | Admitting: Adult Health

## 2021-04-24 DIAGNOSIS — J479 Bronchiectasis, uncomplicated: Secondary | ICD-10-CM | POA: Diagnosis not present

## 2021-04-27 ENCOUNTER — Other Ambulatory Visit (HOSPITAL_COMMUNITY): Payer: Self-pay

## 2021-04-27 MED FILL — Isavuconazonium Sulfate Cap 186 MG: ORAL | 28 days supply | Qty: 56 | Fill #2 | Status: AC

## 2021-04-27 MED FILL — Omalizumab Subcutaneous Soln Prefilled Syringe 75 MG/0.5ML: SUBCUTANEOUS | 28 days supply | Qty: 1 | Fill #2 | Status: AC

## 2021-04-27 MED FILL — Omalizumab Subcutaneous Soln Prefilled Syringe 150 MG/ML: SUBCUTANEOUS | 28 days supply | Qty: 4 | Fill #2 | Status: AC

## 2021-04-29 DIAGNOSIS — B449 Aspergillosis, unspecified: Secondary | ICD-10-CM | POA: Diagnosis not present

## 2021-04-29 DIAGNOSIS — B4481 Allergic bronchopulmonary aspergillosis: Secondary | ICD-10-CM | POA: Diagnosis not present

## 2021-04-29 DIAGNOSIS — R0902 Hypoxemia: Secondary | ICD-10-CM | POA: Diagnosis not present

## 2021-04-29 DIAGNOSIS — A312 Disseminated mycobacterium avium-intracellulare complex (DMAC): Secondary | ICD-10-CM | POA: Diagnosis not present

## 2021-05-01 ENCOUNTER — Other Ambulatory Visit (HOSPITAL_COMMUNITY): Payer: Self-pay

## 2021-05-02 DIAGNOSIS — J479 Bronchiectasis, uncomplicated: Secondary | ICD-10-CM | POA: Diagnosis not present

## 2021-05-03 ENCOUNTER — Other Ambulatory Visit (HOSPITAL_COMMUNITY): Payer: Self-pay

## 2021-05-04 ENCOUNTER — Other Ambulatory Visit (HOSPITAL_COMMUNITY): Payer: Self-pay

## 2021-05-08 NOTE — Telephone Encounter (Signed)
Submitted a Prior Authorization request to [employer] for [employer] via CoverMyMeds. Will update once we receive a response.   Key: Alba Cory

## 2021-05-10 ENCOUNTER — Other Ambulatory Visit: Payer: Self-pay

## 2021-05-10 ENCOUNTER — Ambulatory Visit: Payer: BC Managed Care – PPO | Admitting: Internal Medicine

## 2021-05-10 ENCOUNTER — Encounter: Payer: Self-pay | Admitting: Internal Medicine

## 2021-05-10 ENCOUNTER — Telehealth: Payer: Self-pay | Admitting: Internal Medicine

## 2021-05-10 VITALS — BP 108/62 | HR 78 | Ht 68.0 in | Wt 120.0 lb

## 2021-05-10 DIAGNOSIS — J479 Bronchiectasis, uncomplicated: Secondary | ICD-10-CM

## 2021-05-10 DIAGNOSIS — R768 Other specified abnormal immunological findings in serum: Secondary | ICD-10-CM

## 2021-05-10 DIAGNOSIS — B449 Aspergillosis, unspecified: Secondary | ICD-10-CM | POA: Diagnosis not present

## 2021-05-10 DIAGNOSIS — J4550 Severe persistent asthma, uncomplicated: Secondary | ICD-10-CM

## 2021-05-10 DIAGNOSIS — B4481 Allergic bronchopulmonary aspergillosis: Secondary | ICD-10-CM

## 2021-05-10 DIAGNOSIS — J8283 Eosinophilic asthma: Secondary | ICD-10-CM

## 2021-05-10 MED ORDER — SODIUM CHLORIDE 3 % IN NEBU: INHALATION_SOLUTION | Freq: Every day | RESPIRATORY_TRACT | 11 refills | Status: DC

## 2021-05-10 NOTE — Patient Instructions (Signed)
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 Bronchiectasis without complication ([place])  A98.6     3. 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 Elevated IgE level  R76.8     6. Severe persistent asthma, unspecified whether complicated  N35.43       Stable and overall better since o2 , fasenra and add [employer] like vest is expensive and not beneficial  Plan  - hold off [facility] re-referral for 2nd opinion (expensive and lack of interest) - return vest due to expense  -s tart 7m 3% saline neb once daily to help mucus clearance  - continue o2 at night and exertion  = continue breztri, xolair and fasenra and daily prednisone - continue ABPA/MAC Rx with Dr VDrucilla Schmidt Followup  - 4-6 months or sooner if needed; CAT score at followup

## 2021-05-10 NOTE — Progress Notes (Signed)
male never smoker followed for ABPA , MAC and Aspergilloma .  Patient has a history of aspergilloma , status post VATS in 2012. 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 Followed at [facility] for [facility] .    TEST  1.7/13 Ig E 800s 12/10/11 Sputum AFB  Smear and culture negative (prelim) 12/10/11 Fungal sputum culture - negative (final) 01/30/2012 spirometry - fev1 1.7L/41%, ratip 52 - BEST EVER 01/30/2012 walking desaturation test: 185 feet x 3 laps: did not deaturate  05/2014 >Spiro fev1 1.7L/40%, ratio 55  Spirometry 06/25/2017 showed FEV1 at 28%, ratio 46, FVC 50%.  Events  2010 Dx with MAI >Azithro/Ethambutol 2012 Dx with Aspergilloma >VATS >started on Voriconazaol  2012 Dx w/ ABPA w/ High IgE >4000>started on prednisone  2013 tried off azithr/Etham but developed fever, Cx neg but restarted on rx.  2016 Hospitalzed with hemoptysis >IR guided embolization . FOB/BAL + Saccharomyces on fungal fx . Serum Galactomannan was neg.  [name] evaluation 04/2017 for Transplant -rejected. Felt to early for transplant - recommended pre-transplant weight goal 128 lbs for a BMI > 19          HPI   OV 01/16/2016  Chief Complaint  Patient presents with   Follow-up    Pt denies any increased dyspnea at this time - states that it comes and goes with activity. Pt states it is different on a daily basis. Denies hemoptysis since last OV.   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 Since then he overall feels that his dyspnea is not back to baseline. He feels he has had a new lower baseline. He has more fatigued than usual. But he is stable without any exacerbation. He feels in the next few years ago started to become permanently disabled. He currently works as a cBiomedical scientist There are no fevers.   Last chest x-ray 11/07/2015: Shows hyperinflation personally  visualized Last IgE January 2013 was 841 and significantly improved from 4000 2012.  Last CBC January 2017 with normal used to flows are 100 cells per cubic millimeter.   OV 08/27/2016  Chief Complaint  Patient presents with   Follow-up    Pt states his cough and SOB is at baseline. Pt states he has hemoptysis around once a week at most - penny size of mucus. Pt denies CP/tightness and f/c/s.      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 He status post IR guided embolization of hemoptysis end 2016.   Last visit feb 2017 he was reporting worsening dyspnea. We checked his IgE. Improved significantly but it was still in the 700s. We started Xolair therapy. He's been doing this now for a few months. He says this has helped his dyspnea symptoms significantly. However at end of 2 weeks he starts getting more symptomatic. Overall he is stable. 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 There are no fevers. Here, function test today FEV1 is 1.65 L/42% postbronchodilator. This is similar to 2014 but improved compared to 2015. Off note he tells me that he is mild hemoptysis once a month this is stable. He says this is actually been going on since November 2016 when he had his embolization. This no change in this. He knows that he has to monitor his hemoptysis  volume. He will have his flu shot today  Last chest x-ray 11/07/2015: Shows hyperinflation personally visualized Last IgE FEb 2017 was 717 and started xolair (January 2013 was 841 and significantly improved from 4000 in  2012)   OV 03/18/2017  Chief Complaint  Patient presents with   Follow-up    6 mo f/u. Breathing has been the same since the last visit. Denies any increased SOB or chest pains.    Follow-up severe persistent asthma with ABPA and aspergilloma with MAI infection. He status post IR guided embolization for hemoptysis and end of  2016   Overall he has done well. However he says that at his job at [employer] they have been working hard and he is mostly fatigue with very closely. Recently been only sleeping 3 hours per day because of the work. Then in mid April 2018 he have one episode of hemoptysis while at work. With small amount that lasted few to several minutes. It did scare him but he decided to hold off against going to the emergency department. Since then he's not had any recurrence. He feels stressed and job stress is contributing to this. Most recent CT scan chest as in December 2017 that shows stability documented below. There are no other new issues. He is open to having a transplant referral.   OV 01/27/2018  Chief Complaint  Patient presents with   Follow-up    Pt states he has been doing good. Denies any complaints or concerns.     Follow-up severe persistent asthma with ABPA on prednisone 30m daily, spiriva, breo and Aspergilloma with MAI infection. He status post IR guided embolization of hemoptysis end 2016 and on Xolair since early 2017   Overall doing well. 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 Symptoms score is low ; 12 CAT score. Compliant with Rx. No hemoptysis. Reviewed prior labs - he says on daily prednisone since 2012. And prior to that has had significant eosinophilia and also break through eos since then (see below). Unable to come off prednisone completely. XKathreen Devoidhas helped but not fully. 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 , MRN: [id number], ADDRESS: [address] [place] [id number]  12/17/2018 -   Chief Complaint  Patient presents with   Follow-up    Pt states he was in the hosp Oct and Nov 2019. Pt states he is still not back to his self but is taking  one day at a time. Due to the recent hospitalization is why pt is here for today's appt.     Follow-up severe persistent asthma with ABPA on prednisone 577mdaily, spiriva, breo and Aspergilloma with MAI infection. He status post IR guided embolization of hemoptysis end 2016 and on Xolair since early 2017 bu tstopped since spring 2019 due to insurance issues    HPI KeLIBRADO GUANDIQUE89.o. -returns for follow-up.  I last saw him in the spring 2019.  After that in September 2019 he saw my nurse practitioner.  Then mid October 2019 he was admitted with acute right-sided tension pneumothorax [personally visualized the CT chest and confirmed the findings today].  After that he had a 5-week hospital stay.  This included failed chest tube management and pleurodesis.  Finally he  required endobronchial valves by Dr. Roxan Hockey.  These were also finally removed a few weeks ago.  He saw Dr. Roxan Hockey yesterday.  Chest x-ray follow-up yesterday that I visualized the lungs look expanded.  He is also seen [name] of infectious diseases yesterday and is been asked to continue his MAC and ABPA antifungal therapy.  I did notice that he is not taking Xolair anymore since the spring 2019.  He says is due to insurance issues.  Today he tells me that overall with all these admissions he has been left completely fatigued, lost weight and worn out.  He is now worried about his life expectancy.  At the same time he wants to go on work.  He wants to know if he should apply for disability.  We have had these conversations in the past.  We discussed his life philosophy.  He tells me that if he did not work he would feel that he is lazy and it is not compatible with this personal value system.  I did indicate to him that this is to be completely respected but his medical condition is that he would qualify for disability.  Therefore it is a personal choice he will have to make.  He is applied for Social Security disability through  the hospital social worker in November 2019.  The decision is pending any time now.  He is frustrated that is not able to work 80 hours a day.  He is wondering if he can even work 40 hours a day.  I have only advised him that he should look at complete disability.  If at all he wants to work he can look at a desk job.  Definitely should avoid jobs that heavy workload in the kitchen that involve carrying things or even sneezing or even doing pulmonary function test anymore because given his propensity for pneumothorax any rise in intrathoracic pressure would put him at risk for pneumothorax or other complications.  Other than this in terms of his lung health he is feeling stable at this point.  He is not taking Xolair he stopped this in the spring 2019.  This means is continued IgE destruction of the lungs.  We discussed this and he is willing to look into restarting this  Of note, he also wants me to check his blood work for [name].  I reviewed Dr. Arlyss Queen note.  This is indeed correct because yesterday pick medical record system was down.    OV 10/28/2019  Subjective:  Patient ID: TOMAS SCHAMP, male , DOB: Oct 22, 1976 , age 76 y.o. , MRN: [id number] , ADDRESS: [address] [place] [address]   10/28/2019 -   Chief Complaint  Patient presents with   Follow-up    Pt states he has been doing okay since last visit. States he has good and bad days with his breathing and also has an occ cough.      Follow-up severe persistent asthma with ABPA on prednisone 70m daily, spiriva, breo and Aspergilloma with MAI infection. He status post IR guided embolization of hemoptysis end 2016 and on Xolair since early 2017 bu tstopped since spring 2019 due to insurance issues. FASENRA since April/May 2020  HPI KHUDSON MAJKOWSKI89y.o. -routine follow-up last seen in January 2020.  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  He will not lift more than 15 pounds because of his pneumothorax risk and dyspnea.  He is happy now that he has employed based insurance.  He did apply for disability but did not qualify because he did not desaturate with a 6-minute walk test.  This is despite him having severe obstructive lung disease.  He says he is reconciled and he likes to work because it gives him purpose.  He has had his flu shot.  He is now taking [name] since spring 2020 and this seems to be working well for him.  He takes it every 8 weeks.  He has not had any exacerbations.  He prefers to avoid pulmonary function test because of the pneumothorax risk.  There are no other new issues.     CAT Symptom & Quality of Life Score (GSK trademark) 0 is no burden. 5 is highest burden 01/27/2018   Never Cough -> Cough all the time 3  No phlegm in chest -> Chest is full of phlegm 2  No chest tightness -> Chest feels very tight 1  No dyspnea for 1 flight stairs/hill -> Very dyspneic for 1 flight of stairs 3  No limitations for ADL at home -> Very limited with ADL at home 1  Confident leaving home -> Not at all confident leaving home 0  Sleep soundly -> Do not sleep soundly because of lung condition 1  Lots of Energy -> No energy at all 1  TOTAL Score (max 40)  12    Results for DMARION, PERFECT (MRN [id number]) as of 01/27/2018 09:12  Ref. Range 08/06/2016 09:09 06/25/2017 09:11  FEV1-Pre Latest Units: L 1.50 1.16  FEV1-%Pred-Pre Latest Units: % 39 28  Pre FEV1/FVC ratio Latest Units: % 53 46     Results for LODEN, LAURENT (MRN [id number]) as of 01/27/2018 09:12  Ref. Range 12/16/2010 18:25 12/17/2010 03:45 12/17/2010 12:38 12/18/2010 04:25 12/19/2010 04:00 12/20/2010 04:04 12/21/2010 03:37 12/22/2010 06:42 12/22/2010 15:22 12/23/2010 04:22 12/24/2010 03:30 12/25/2010 04:19 12/27/2010 04:00 03/05/2011 18:15 03/06/2011 09:08 03/07/2011 09:18 03/09/2011 05:10 03/11/2011  04:54 04/02/2011 09:41 05/28/2011 09:42 11/26/2011 17:14 12/10/2011 10:21 12/10/2011 10:21 03/13/2012 16:54 09/22/2012 10:46 11/23/2013 09:46 07/05/2014 09:58 02/16/2015 10:16 08/08/2015 10:04 10/13/2015 04:00 10/13/2015 04:20 10/14/2015 02:28 10/15/2015 04:44 12/05/2015 12:16 01/16/2016 16:16 04/09/2016 09:43 08/09/2017 10:48 08/10/2017 10:46  Eosinophils Absolute Latest Ref Range: 0.0 - 0.7 K/uL 0.3 0.1  0.4          0.0 0.0 0.1   0.1 0.0 0.0    0.1 0.1 0.1 0.1 0.0 0.1   0.1 0.1 0.0 196 0.0 0.0   Results for BUCKLEY, BRADLY (MRN [id number]) as of 01/27/2018 09:12  Ref. Range 03/07/2011 09:18 03/08/2011 16:23 11/26/2011 17:14 01/16/2016 16:16 10/28/2019   IgE (Immunoglobulin E), Serum Latest Ref Range: <115 kU/L 4185.2 Result con... (H) 4744.6.Marland Kitchen. (H) 841.8 (H) 717 (H) 537      OV 06/09/2020  Subjective:  Patient ID: THERRON SELLS, male , DOB: [birth year] , age 64 y.o. , MRN: [id number] , ADDRESS: [address] [place] [address]   06/09/2020 -   Chief Complaint  Patient presents with   Hospitalization Follow-up    recent PNA- feeling weak today and low energy. He is not coughing more than usual baseline at this time- last hemoptysis 06/07/20.      HPI JAMOND NEELS 45 y.o. -returns for follow-up.  He was admitted  June 04, 2020 with hemoptysis over several weeks.  This is CT diagnosis of left lower lobe pneumonia according to the discharge notes.  He was discharged 1 day prior to this visit on June 15, 2020.  The CT report as below.  Currently he feels free of hemoptysis but just feels fatigued.  He still continues to work.  He is not interested in disability.  He is taking Spiriva and Breo.  He is taken a few days of work because of the post admission fatigue.  Of note his CT chest shows evidence of right heart strain.  He did not have an echocardiogram yet   IMPRESSION: 1. 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 Additional filling defects  present in the left upper lobe segmental and subsegmental branches as well. These are both within regions of extensive bullous disease, architectural distortion and surgical change and the peripherally marginated appearance is most suggestive of chronic embolus/occlusion. 2. While the RV/LV ratio is increased (1.15) the intraventricular septum remains appropriately convex and there is associated right atrial enlargement with reflux of contrast which could suggest more longstanding right heart enlargement and chronic pulmonary artery hypertension rather than acute right heart strain though if there is clinical concern, echocardiography could be performed for more definitive assessment. 3. Extensive chronic bullous paraseptal predominant emphysematous changes. Scattered areas of thick walled cystic bulla towards the apices with some peripheral nodularity and layering air-fluid level within the left apical bulla which could reflect a persisting fungal or atypical superinfection. Certainly could present with hemoptysis. 4. Further mixed areas of ground-glass and consolidative opacity in the left lower and right lower lobe periphery, also likely infectious or inflammatory with the differential including potential atypical viral infection such as COVID-19. 5. Cholelithiasis. 6. Moderate bilateral gynecomastia. 7. Paucity of subcutaneous fat.  Correlate with nutritional status. 8. Aortic Atherosclerosis (ICD10-I70.0). 9. Emphysema (ICD10-J43.9).   These results were called by telephone at the time of interpretation on 06/04/2020 at 10:50 pm to provider DAVID YAO , who verbally acknowledged these results.     Electronically Signed   By: Lovena Le M.D.   On: 06/04/2020 22:50  ROS - per HPI     OV 11/02/2020  Subjective:  Patient ID: Katheren Puller, male , DOB: 03-Nov-1976 , age 77 y.o. , MRN: [id number] , ADDRESS: [place] [address] PCP Robert Bellow, PA-C Patient Care Team: Rip Harbour as PCP - General (Internal Medicine) Tommy Medal, Lavell Islam, MD as Consulting Physician (Infectious Diseases) System, Provider Not In as Consulting Physician Brand Males, MD as Consulting Physician (Pulmonary Disease)  This Provider for this visit: Treatment Team:  Attending Provider: Brand Males, MD     Follow-up - - severe persistent asthma with ABPA and highly elevated IgE levels [4000 and 2012] on prednisone 77m daily, BREZTRI  - >  he  and on Xolair since early 2017 bu tstopped since spring 2019 due to insurance issues. FASENRA since April/May 2020 -  Aspergilloma   - Rx voriconazole -> developed skin lesions and darkness in the forearms ->Rx Cresemba via Dr VTommy Medal  -  status post IR guided embolization of hemoptysis end 2016  -  MAI infection. 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 More tired than normal, shortness of breath is worse. Productive cough with yellow/green sputum since October     HPI HO PARISI 45 y.o. -returns for follow-up.. Last seen in July 2021. After that he is seen nurse practitioner because of exacerbation symptoms. The check sputum culture couple of times I reviewed the records but this is all no growth. He says that he now has increased sputum production than baseline. It is yellow-green in color. He is frustrated that the cultures have not grown anything. In addition is reporting significant fatigue. His last blood IgE was in 2020 when it improved from 4000 several years ago to 500. He had recent chest x-ray in the last couple of months both of them was stable with chronic changes. He feels  very miserable because of his fatigue and respiratory symptoms. He says not able to sleep well in the night. He says the quality of life is miserable  He is up-to-date with his vaccination   OV 12/30/2020  Subjective:  Patient ID: Katheren Puller, male , DOB: May 10, 1976 , age 31 y.o. , MRN: [id number] , AD[address]: [place] [address] [place] [address] PCP Robert Bellow, PA-C Patient Care Team: Rip Harbour as PCP - General (Internal Medicine) Tommy Medal, Lavell Islam, MD as Consulting Physician (Infectious Diseases) System, Provider Not In as Consulting Physician Brand Males, MD as Consulting Physician (Pulmonary Disease)  This Provider for this visit: Treatment Team:  Attending Provider: Melvenia Needles, NP    12/30/2020 -   Chief Complaint  Patient presents with   Follow-up    Breathing is not doing well     Follow-up - - severe persistent asthma with ABPA and highly elevated IgE levels [4000 and 2012]   -> on prednisone 71m daily -. 177mdaily, BREZTRI  - >  He was on Xolair since early 2017 bu tstopped since spring 2019 due to insurance issues. -> resarted Ja 2022 due to worsening IgE and symptoms /PFT  -> FASENRA since April/May 2020  - > started VVEST Jan 20202  -  Aspergilloma   - Rx voriconazole -> developed skin lesions and darkness in the forearms ->Rx Cresemba via Dr VaTommy Medal -  status post IR guided embolization of hemoptysis end 2016  -  MAI infection. 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 -presents for visit.  He tells me in the last 3 weeks he has had a decline in shortness of breath increased cough with yellow sputum production.  He says sputum has been repeatedly tested and culture negative.  He is also reporting desaturations at home when he exerts himself  moving boxes at work or when he climbs a flight of stairs.  He feels he wants oxygen.  He does started Xolair a few weeks ago and he feels it has not kicked in as yet.  Today we walked him up a flight of stairs and he desaturated to 85% got extremely short of breath.  He corrected with 2 L nasal cannula. I personally walked him up   CT Chest data  No results found.     OV 05/10/2021  Subjective:  Patient ID:  Katheren Puller, male , DOB: 14-Apr-1976 , age 20 y.o. , MRN: [id number] , AD[address]: [address] [place] [id number] PCP Robert Bellow, PA-C Patient Care Team: Rip Harbour as PCP - General (Internal Medicine) Tommy Medal, Lavell Islam, MD as Consulting Physician (Infectious Diseases) System, Provider Not In as Consulting Physician Brand Males, MD as Consulting Physician (Pulmonary Disease)  This Provider for this visit: Treatment Team:  Attending Provider: Brand Males, MD    05/10/2021 -   Chief Complaint  Patient presents with   Follow-up    Pt states he has been doing okay since last visit. States that his breathing has been a little worse due to the hot weather.    Follow-up - - severe persistent asthma with ABPA and highly elevated IgE levels [4000 and 2012]   -> on prednisone 50m daily -. 127mdaily, BREZTRI  - >  He was on Xolair since early 2017 bu tstopped since spring 2019 due to insurance issues. -> resarted Ja 2022 due to worsening IgE and symptoms /PFT  -> FASENRA since April/May 2020 + add Xolair Jan /feb 2022  - > started VVEST Jan 20202  -> night o2 and ex o2 - spring 2022  -  Aspergilloma   - Rx voriconazole -> developed skin lesions and darkness in the forearms ->Rx Cresemba via Dr VaLucianne Leiam.-> skin beter  -  status post IR guided embolization of hemoptysis end 2016  - life long  -  MAI infection. 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  HPI KeNAZIR HACKER89.o. -returns for follow-up.  Since starting Xolair earlier in the year and oxygen at night and with exertion he is feeling better.  His respiratory symptoms are not as bad and he only had 1 exacerbation.  He says he has been using the vibratory vest in his chest but this is hurting him.  This 1 report of osteoporosis.  Moreover it is expensive.  He wants to return this.  He says the amount of sputum he brings is very little.  He is wondering if there is another alternative.  He followed up with infectious diseases.  [facility] but he said it was too expensive does not want to go.    CT Chest data  No results found.    PFT  PFT Results Latest Ref Rng & Units 11/30/2020 06/25/2017 08/06/2016  FVC-Pre L 2.09 2.52 2.84  FVC-Predicted Pre % 47 50 61  FVC-Post L 2.16 - 2.93  FVC-Predicted Post % 49 - 63  Pre FEV1/FVC % % 56 46 53  Post FEV1/FCV % % 56 - 56  FEV1-Pre L 1.17 1.16 1.50  FEV1-Predicted Pre % 33 28 39  FEV1-Post L 1.22 - 1.63  DLCO uncorrected ml/min/mmHg 15.81 - 18.56  DLCO UNC% % 53 - 59  DLCO corrected ml/min/mmHg 16.67 - 18.35  DLCO COR %Predicted % 56 - 59  DLVA Predicted % 123 - 108  TLC L - - 5.14  TLC % Predicted % - - 76  RV % Predicted % - - 129       has a past medical history of Anemia, 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  [place]), Chronic viral hepatitis B without delta-agent ([place]) (11/23/2020), Drug rash (12/16/2017), Dyspnea, Esophageal reflux, Hypothyroidism, Lung disease, bullous ([place]), MAI (mycobacterium avium-intracellulare) ([place]), Mold exposure (12/05/2015), Photosensitivity dermatitis (09/16/2017), Rib fracture (02/08/2021), Solar lentigo (08/08/2015), Vaccine counseling (12/28/2020), and Weight loss  (10/15/2016).   reports that he quit smoking about 13 years ago. His smoking use included cigarettes. He has a 13.30 pack-year smoking history. He quit smokeless tobacco use about 26 years ago.  His smokeless tobacco use included snuff.  Past Surgical History:  Procedure Laterality Date   CHEST TUBE INSERTION Right 09/29/2018   Procedure: CHEST TUBE REPLACEMENT;  Surgeon: Melrose Nakayama, MD;  Location: [facility];  Service: Thoracic;  Laterality: Right;   INSERTION OF IBV VALVE N/A 09/19/2018   Procedure: INSERTION OF INTERBRONCHIAL VALVE (IBV);  Surgeon: Melrose Nakayama, MD;  [facility] OR;  Service: Thoracic;  Laterality: N/A;   INSERTION OF IBV VALVE N/A 12/01/2018   Procedure: REMOVAL OF INTERBRONCHIAL VALVE (IBV);  Surgeon: Melrose Nakayama, MD;  Location: [facility];  Service: Thoracic;  Laterality: N/A;   left VATS  2012   thoracotomy and LUL apical posterior segmentectomy   STAPLING OF BLEBS Right 09/12/2018   Procedure: STAPLING OF BLEBS, right lower and middle lung lobes;  Surgeon: Melrose Nakayama, MD;  Location: [facility];  Service: Thoracic;  Laterality: Right;   VIDEO ASSISTED THORACOSCOPY Right 09/12/2018   Procedure: VIDEO ASSISTED THORACOSCOPY, right lung;  Surgeon: Melrose Nakayama, MD;  Location: [facility];  Service: Thoracic;  Laterality: Right;   VIDEO BRONCHOSCOPY Bilateral 10/20/2015   Procedure: VIDEO BRONCHOSCOPY WITHOUT FLUORO;  Surgeon: Marshell Garfinkel, MD;  Location: [facility];  Service: Cardiopulmonary;  Laterality: Bilateral;   VIDEO BRONCHOSCOPY N/A 09/19/2018   Procedure: VIDEO BRONCHOSCOPY;  Surgeon: Melrose Nakayama, MD;  Location: [facility];  Service: Thoracic;  Laterality: N/A;   VIDEO BRONCHOSCOPY N/A 12/01/2018   Procedure: VIDEO BRONCHOSCOPY;  Surgeon: Melrose Nakayama, MD;  Location: [facility];  Service: Thoracic;  Laterality: N/A;   VIDEO BRONCHOSCOPY WITH INSERTION OF INTERBRONCHIAL VALVE (IBV) N/A 09/29/2018   Procedure: VIDEO  BRONCHOSCOPY WITH INSERTION OF INTERBRONCHIAL VALVE  (IBV);  Surgeon: Melrose Nakayama, MD;  Location: [facility];  Service: Thoracic;  Laterality: N/A;    Allergies  Allergen Reactions   Clarithromycin Other (See Comments)    Adverse reaction w/ voriconazole UNSPECIFIED REACTION    Rifaximin Other (See Comments)    Adverse reaction w/ voriconazole UNSPECIFIED REACTION     Immunization History  Administered Date(s) Administered   DTaP 11/19/2010   Influenza Split 10/29/2011, 08/14/2012   Influenza Whole 09/19/2010   Influenza,inj,Quad PF,6+ Mos 11/23/2013, 09/28/2014, 09/06/2015, 08/27/2016, 08/10/2017, 09/09/2018, 08/06/2019, 08/10/2020   PFIZER Comirnaty(Gray Top)Covid-19 Tri-Sucrose Vaccine 12/28/2020   PFIZER(Purple Top)SARS-COV-2 Vaccination 02/18/2020, 03/14/2020, 08/10/2020   Pneumococcal Conjugate-13 04/29/2017, 04/29/2017   Pneumococcal Polysaccharide-23 12/17/2010    Family History  Adopted: Yes     Current Outpatient Medications:    albuterol (PROVENTIL) (2.5 MG/3ML) 0.083% nebulizer solution, Take 3 mLs (2.5 mg total) by nebulization every 6 (six) hours as needed for wheezing or shortness of breath., Disp: 75 mL, Rfl: 12   albuterol (VENTOLIN HFA) 108 (90 Base) MCG/ACT inhaler, Inhale 2 puffs into the lungs every 6 (six) hours as needed., Disp: 18 g, Rfl: 2   azithromycin (ZITHROMAX) 500 MG tablet, Take 1 tablet by mouth once daily, Disp: 30 tablet, Rfl: 5   Benralizumab (FASENRA PEN) 30 MG/ML SOAJ, Inject 1 mL (30 mg total)  into the skin every 8 (eight) weeks. Deliver to patient's home. Patient is next due 03/15/21, Disp: 1 mL, Rfl: 2   BREZTRI AEROSPHERE 160-9-4.8 MCG/ACT AERO, INHALE 2 PUFFS TWICE DAILY, Disp: 11 g, Rfl: 0   Calcium Carbonate-Vitamin D 600-400 MG-UNIT tablet, Take 1 tablet by mouth daily. , Disp: , Rfl:    ethambutol (MYAMBUTOL) 400 MG tablet, TAKE 2 AND 1/2 TABLETS BY MOUTH ONCE DAILY, Disp: 598 tablet, Rfl: 5   feeding supplement, ENSURE ENLIVE,  (ENSURE ENLIVE) LIQD, Take 237 mLs by mouth 3 (three) times daily between meals. (Patient taking differently: Take 237 mLs by mouth 2 (two) times daily between meals.), Disp: 237 mL, Rfl: 12   FEROSUL 325 (65 Fe) MG tablet, Take 325 mg by mouth every morning., Disp: , Rfl:    folic acid (FOLVITE) 1 MG tablet, Take 1 mg by mouth daily., Disp: , Rfl:    guaiFENesin (MUCINEX) 600 MG 12 hr tablet, Take 1 tablet (600 mg total) by mouth 2 (two) times daily., Disp: 30 tablet, Rfl: 0   Isavuconazonium Sulfate 186 MG CAPS, TAKE 2 CAPSULES BY MOUTH DAILY, Disp: 56 capsule, Rfl: 5   levothyroxine (SYNTHROID) 100 MCG tablet, Take 100 mcg by mouth daily., Disp: , Rfl:    mirtazapine (REMERON SOL-TAB) 15 MG disintegrating tablet, Take by mouth., Disp: , Rfl:    omalizumab (XOLAIR) 150 MG/ML prefilled syringe, INJECT 300 MG INTO THE SKIN EVERY 14 (FOURTEEN) DAYS. IN ADDITION TO 75MG (TOTAL DOSE IS 375MG EVERY 14 DAYS)., Disp: 4 mL, Rfl: 11   omalizumab (XOLAIR) 75 MG/0.5ML prefilled syringe, INJECT 75 MG INTO THE SKIN EVERY 14 (FOURTEEN) DAYS. IN ADDITION TO 300MG (TOTAL DOSE IS 375MG EVERY 14 DAYS). DELIVER TO PATIENT'S HOME., Disp: 1 mL, Rfl: 11   predniSONE (DELTASONE) 10 MG tablet, Take 1 tablet (10 mg total) by mouth daily with breakfast., Disp: 90 tablet, Rfl: 3   sodium chloride HYPERTONIC 3 % nebulizer solution, Take by nebulization daily. Dx: J47.9, Disp: 120 mL, Rfl: 11   EPINEPHrine 0.3 mg/0.3 mL IJ SOAJ injection, Inject 0.3 mg into the muscle once for 1 dose., Disp: 1 each, Rfl: 11   lidocaine (LIDODERM) 5 %, Place 1 patch onto the skin daily as needed (pain). Remove & Discard patch within 12 hours or as directed by MD (Patient not taking: Reported on 05/10/2021), Disp: 30 patch, Rfl: 0      Objective:   Vitals:   05/10/21 1145  BP: 108/62  Pulse: 78  SpO2: 96%  Weight: 120 lb (54.4 kg)  Height: _0  (1.727 m)    Estimated body mass index is 18.25 kg/m as calculated from the following:    Height as of this encounter: _1  (1.727 m).   Weight as of this encounter: 120 lb (54.4 kg).  _2 @  Filed Weights   05/10/21 1145  Weight: 120 lb (54.4 kg)     Physical Exam    General: No distress. Thin male Neuro: Alert and Oriented x 3. GCS 15. Speech normal Psych: Pleasant Resp:  Barrel Chest - no.  Wheeze - no, Crackles - no, No overt respiratory distress CVS: Normal heart sounds. Murmurs - no Ext: Stigmata of Connective Tissue Disease - no HEENT: Normal upper airway. PEERL +. 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 Bronchiectasis without complication ([place])  J47.9     3. 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 Elevated IgE level  R76.8     6. 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 Bronchiectasis without complication ([place])  H40.3     3. 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 Elevated IgE level  R76.8     6. Severe persistent asthma, unspecified whether complicated  Y59.09       Stable and overall better since o2 , fasenra and add [employer]KE like vest is expensive and not beneficial  Plan  - [facility] off [facility] re-referral for 2nd opinion (expensive and lack of interest) - return vest due to expense  -s tart 57m 3% saline neb once daily to help mucus clearance  - continue o2 at night and exertion  = continue breztri, xolair and fasenra and daily prednisone - continue ABPA/MAC Rx with Dr VDrucilla Schmidt Followup  - 4-6 months or sooner if needed; CAT score at followup    SIGNATURE    Dr. MBrand Males M.D., F.C.C.P,  Pulmonary and Critical Care Medicine Staff Physician, CSteptoeDirector - Interstitial Lung Disease   Program  [place] [address] Pager: [phone] If no answer or between  15:00h - 7:00h: call [phone] Telephone: [phone]  12:21 PM 05/10/2021

## 2021-05-10 NOTE — Telephone Encounter (Signed)
Khiqueta from [place] is requesting a Prior Authorization for omalizumab Arvid Right) 150 MG/ML prefilled syringe; stated that they were not able to approve and are requesting a new Prior Authorization. Pls regard; [phone].  **Have 10 days to submit any additional information and 180 days to file the appeal.**  Reference #: [id number]

## 2021-05-10 NOTE — Telephone Encounter (Signed)
Received notification via CoverMyMeds regarding a Prior Authorization from [employer] for [employer]. Authorization has been DENIED. There is not currently any information provided about the denial, but it is likely due to the pt taking Fasenra as well. Awaiting fax from plan to confirm.

## 2021-05-11 MED ORDER — SODIUM CHLORIDE 3 % IN NEBU
INHALATION_SOLUTION | Freq: Every day | RESPIRATORY_TRACT | 11 refills | Status: DC
Start: 2021-05-11 — End: 2022-05-08

## 2021-05-11 NOTE — Telephone Encounter (Addendum)
Letter drafted to submit to [facility]. Will place in Dr. Golden Pop mailbox for review. Submitted most recent chart notes for now for review stating he is stable and improved since starting oxygen and dual biologic therapy/  These chart notes do not replace the appeal that would have to be submitted if the denial stands  Knox Saliva, PharmD, MPH Clinical Pharmacist (Rheumatology and Pulmonology)

## 2021-05-11 NOTE — Telephone Encounter (Signed)
Called and spoke with Patient. Patient stated he was prescribed hypertonic saline nebs at [facility] 05/10/21 with Dr Chase Caller.  Patient stated prescription was sent to [facility] and he was told they do not carry hypertonic nebs.  Patient stated he called [facility] and was told it was on backorder.   I called [facility] and they have hypertonic saline in stock.   Called and advised [facility] had hypertonic saline nebs.  Patient requested prescription to be sent to [facility].  Advised Patient on location of [facility].  Understanding stated.  Hypertonic saline prescription sent to [facility].  Nothing further at this time.

## 2021-05-11 NOTE — Telephone Encounter (Signed)
Pt stated that the sodium chloride HYPERTONIC 3 % nebulizer solution is not carried at the [facility] but he is requesting it to be sent to [facility] [facility] [place], [place] and he stated that he contacted the pharmacist yesterday and they said that it was on back order and he wanted to know does he still have to wait if it is on back order. Pls regard; [phone]

## 2021-05-16 ENCOUNTER — Telehealth: Payer: Self-pay | Admitting: Internal Medicine

## 2021-05-16 NOTE — Telephone Encounter (Signed)
Returned call to patient regarding Xolair. He states he is due tomorrow for Xolair and has syringes for that dose only. Advised that he was denied d/t [name] also being on board. Advised that urgent appeal has been submitted. Also recommended that he reach out to clinic if he does not here from our clinic since he will need samples until appeal is processed.  He verbalized understanding. Will close this encounter and f/u in previous auth encounter,  Knox Saliva, PharmD, MPH Clinical Pharmacist (Rheumatology and Pulmonology)

## 2021-05-17 NOTE — Telephone Encounter (Signed)
URGENT appeal for Xolair faxed to [facility]  Fax: [phone] Phone: [phone]

## 2021-05-19 NOTE — Telephone Encounter (Signed)
Called plan for status update, appeal is still in process with an expected due date of July 14th. Verified that appeal was marked as urgent. Will continue to f/u.  Phone# [phone]

## 2021-05-23 ENCOUNTER — Telehealth: Payer: Self-pay | Admitting: Internal Medicine

## 2021-05-23 NOTE — Telephone Encounter (Addendum)
Returned call to patient and advised that appeal is still in process for Xolair. He will plan to stop by tomorrow, 05/24/21 for Xolair sample.  2 x 120m syringe and 1 x 767msyringe - sample for patient labeled in PyBeattieith name/DOB  He is due for dose on 05/31/21 which is before pharmacy team will receive appeal decision notification  DeKnox SalivaPharmD, MPH Clinical Pharmacist (Rheumatology and Pulmonology)

## 2021-05-24 ENCOUNTER — Other Ambulatory Visit (HOSPITAL_COMMUNITY): Payer: Self-pay

## 2021-05-24 DIAGNOSIS — J479 Bronchiectasis, uncomplicated: Secondary | ICD-10-CM | POA: Diagnosis not present

## 2021-05-24 MED FILL — Omalizumab Subcutaneous Soln Prefilled Syringe 75 MG/0.5ML: SUBCUTANEOUS | 28 days supply | Qty: 1 | Fill #3 | Status: CN

## 2021-05-24 MED FILL — Omalizumab Subcutaneous Soln Prefilled Syringe 150 MG/ML: SUBCUTANEOUS | 28 days supply | Qty: 4 | Fill #3 | Status: CN

## 2021-05-24 NOTE — Telephone Encounter (Signed)
Patient picked up sample of Xolair 11m x 2 and 762mx 1 syringe. Dose is due 05/31/21  DeKnox SalivaPharmD, MPH Clinical Pharmacist (Rheumatology and Pulmonology)

## 2021-05-26 ENCOUNTER — Other Ambulatory Visit (HOSPITAL_COMMUNITY): Payer: Self-pay

## 2021-05-29 DIAGNOSIS — B4481 Allergic bronchopulmonary aspergillosis: Secondary | ICD-10-CM | POA: Diagnosis not present

## 2021-05-29 DIAGNOSIS — R0902 Hypoxemia: Secondary | ICD-10-CM | POA: Diagnosis not present

## 2021-05-29 DIAGNOSIS — A312 Disseminated mycobacterium avium-intracellulare complex (DMAC): Secondary | ICD-10-CM | POA: Diagnosis not present

## 2021-05-29 DIAGNOSIS — B449 Aspergillosis, unspecified: Secondary | ICD-10-CM | POA: Diagnosis not present

## 2021-06-01 DIAGNOSIS — J479 Bronchiectasis, uncomplicated: Secondary | ICD-10-CM | POA: Diagnosis not present

## 2021-06-02 NOTE — Telephone Encounter (Addendum)
Per rep, appeal has been OVERTURNED. Despite multiple attempts at having the new approval letter faxed to our office, I reached back out to insurance and requested PA info be provided verbally.  Authorization# [id number] Approval dates: 05/18/21-08/18/21  Prescriptions processed for a combined copay of $5.00  Reached out and went over findings with pt. Patient verbalized understanding. Requested he reach back out to [place] if he had any additional questions or concerns, but there is nothing else needed at this time.

## 2021-06-03 ENCOUNTER — Other Ambulatory Visit: Payer: Self-pay | Admitting: Adult Health

## 2021-06-05 ENCOUNTER — Other Ambulatory Visit (HOSPITAL_COMMUNITY): Payer: Self-pay

## 2021-06-05 ENCOUNTER — Telehealth: Payer: Self-pay | Admitting: Internal Medicine

## 2021-06-05 MED FILL — Omalizumab Subcutaneous Soln Prefilled Syringe 150 MG/ML: SUBCUTANEOUS | 28 days supply | Qty: 4 | Fill #3 | Status: AC

## 2021-06-05 MED FILL — Omalizumab Subcutaneous Soln Prefilled Syringe 150 MG/ML: SUBCUTANEOUS | 28 days supply | Qty: 4 | Fill #3 | Status: CN

## 2021-06-05 MED FILL — Omalizumab Subcutaneous Soln Prefilled Syringe 75 MG/0.5ML: SUBCUTANEOUS | 28 days supply | Qty: 1 | Fill #3 | Status: AC

## 2021-06-06 ENCOUNTER — Other Ambulatory Visit (HOSPITAL_COMMUNITY): Payer: Self-pay

## 2021-06-07 ENCOUNTER — Other Ambulatory Visit (HOSPITAL_COMMUNITY): Payer: Self-pay

## 2021-06-07 DIAGNOSIS — E033 Postinfectious hypothyroidism: Secondary | ICD-10-CM | POA: Diagnosis not present

## 2021-06-07 MED FILL — Isavuconazonium Sulfate Cap 186 MG: ORAL | 28 days supply | Qty: 56 | Fill #3 | Status: AC

## 2021-06-09 ENCOUNTER — Other Ambulatory Visit (HOSPITAL_COMMUNITY): Payer: Self-pay

## 2021-06-10 ENCOUNTER — Other Ambulatory Visit: Payer: Self-pay | Admitting: Adult Health

## 2021-06-14 NOTE — Telephone Encounter (Signed)
Received fax from [facility] with approval letter for Xolair appeal that was overturned. Scanned letter into patient's media tab for future reference  Case: [id number]

## 2021-06-15 NOTE — Telephone Encounter (Signed)
Patient's Xolair denial was over turned. Received letter from [facility] yesterday and filed into patient's chart. Nothing further needed.  Knox Saliva, PharmD, MPH, BCPS Clinical Pharmacist (Rheumatology and Pulmonology)

## 2021-06-15 NOTE — Telephone Encounter (Signed)
Please advise on update. Thanks.

## 2021-06-16 ENCOUNTER — Other Ambulatory Visit (HOSPITAL_COMMUNITY): Payer: Self-pay

## 2021-06-19 ENCOUNTER — Other Ambulatory Visit (HOSPITAL_COMMUNITY): Payer: Self-pay

## 2021-06-19 MED FILL — Omalizumab Subcutaneous Soln Prefilled Syringe 150 MG/ML: SUBCUTANEOUS | 28 days supply | Qty: 4 | Fill #4 | Status: AC

## 2021-06-19 MED FILL — Omalizumab Subcutaneous Soln Prefilled Syringe 75 MG/0.5ML: SUBCUTANEOUS | 28 days supply | Qty: 1 | Fill #4 | Status: AC

## 2021-06-24 DIAGNOSIS — J479 Bronchiectasis, uncomplicated: Secondary | ICD-10-CM | POA: Diagnosis not present

## 2021-06-28 ENCOUNTER — Other Ambulatory Visit (HOSPITAL_COMMUNITY): Payer: Self-pay

## 2021-06-29 DIAGNOSIS — B449 Aspergillosis, unspecified: Secondary | ICD-10-CM | POA: Diagnosis not present

## 2021-06-29 DIAGNOSIS — A312 Disseminated mycobacterium avium-intracellulare complex (DMAC): Secondary | ICD-10-CM | POA: Diagnosis not present

## 2021-06-29 DIAGNOSIS — R0902 Hypoxemia: Secondary | ICD-10-CM | POA: Diagnosis not present

## 2021-06-29 DIAGNOSIS — B4481 Allergic bronchopulmonary aspergillosis: Secondary | ICD-10-CM | POA: Diagnosis not present

## 2021-07-02 DIAGNOSIS — J479 Bronchiectasis, uncomplicated: Secondary | ICD-10-CM | POA: Diagnosis not present

## 2021-07-05 ENCOUNTER — Other Ambulatory Visit (HOSPITAL_COMMUNITY): Payer: Self-pay

## 2021-07-12 ENCOUNTER — Other Ambulatory Visit: Payer: Self-pay | Admitting: Infectious Disease

## 2021-07-12 ENCOUNTER — Other Ambulatory Visit (HOSPITAL_COMMUNITY): Payer: Self-pay

## 2021-07-12 DIAGNOSIS — A310 Pulmonary mycobacterial infection: Secondary | ICD-10-CM

## 2021-07-12 DIAGNOSIS — B449 Aspergillosis, unspecified: Secondary | ICD-10-CM

## 2021-07-12 MED ORDER — CRESEMBA 186 MG PO CAPS
2.0000 | ORAL_CAPSULE | Freq: Every day | ORAL | 5 refills | Status: DC
  Filled 2021-07-12: qty 56, 28d supply, fill #0
  Filled 2021-08-11: qty 56, 28d supply, fill #1
  Filled 2021-09-06 – 2021-09-11 (×2): qty 56, 28d supply, fill #2

## 2021-07-13 ENCOUNTER — Other Ambulatory Visit (HOSPITAL_COMMUNITY): Payer: Self-pay

## 2021-07-14 ENCOUNTER — Other Ambulatory Visit (HOSPITAL_COMMUNITY): Payer: Self-pay

## 2021-07-19 ENCOUNTER — Other Ambulatory Visit (HOSPITAL_COMMUNITY): Payer: Self-pay

## 2021-07-25 ENCOUNTER — Other Ambulatory Visit (HOSPITAL_COMMUNITY): Payer: Self-pay

## 2021-07-25 DIAGNOSIS — D649 Anemia, unspecified: Secondary | ICD-10-CM | POA: Diagnosis not present

## 2021-07-25 DIAGNOSIS — J479 Bronchiectasis, uncomplicated: Secondary | ICD-10-CM | POA: Diagnosis not present

## 2021-07-30 DIAGNOSIS — B449 Aspergillosis, unspecified: Secondary | ICD-10-CM | POA: Diagnosis not present

## 2021-07-30 DIAGNOSIS — R0902 Hypoxemia: Secondary | ICD-10-CM | POA: Diagnosis not present

## 2021-07-30 DIAGNOSIS — A312 Disseminated mycobacterium avium-intracellulare complex (DMAC): Secondary | ICD-10-CM | POA: Diagnosis not present

## 2021-07-30 DIAGNOSIS — B4481 Allergic bronchopulmonary aspergillosis: Secondary | ICD-10-CM | POA: Diagnosis not present

## 2021-08-02 DIAGNOSIS — J479 Bronchiectasis, uncomplicated: Secondary | ICD-10-CM | POA: Diagnosis not present

## 2021-08-03 ENCOUNTER — Other Ambulatory Visit (HOSPITAL_COMMUNITY): Payer: Self-pay

## 2021-08-03 MED FILL — Omalizumab Subcutaneous Soln Prefilled Syringe 75 MG/0.5ML: SUBCUTANEOUS | 28 days supply | Qty: 1 | Fill #5 | Status: AC

## 2021-08-03 MED FILL — Omalizumab Subcutaneous Soln Prefilled Syringe 150 MG/ML: SUBCUTANEOUS | 28 days supply | Qty: 4 | Fill #5 | Status: AC

## 2021-08-04 ENCOUNTER — Other Ambulatory Visit (HOSPITAL_COMMUNITY): Payer: Self-pay

## 2021-08-04 ENCOUNTER — Other Ambulatory Visit: Payer: Self-pay | Admitting: Internal Medicine

## 2021-08-04 DIAGNOSIS — J4550 Severe persistent asthma, uncomplicated: Secondary | ICD-10-CM

## 2021-08-06 ENCOUNTER — Other Ambulatory Visit: Payer: Self-pay | Admitting: Infectious Disease

## 2021-08-06 DIAGNOSIS — A310 Pulmonary mycobacterial infection: Secondary | ICD-10-CM

## 2021-08-07 ENCOUNTER — Other Ambulatory Visit (HOSPITAL_COMMUNITY): Payer: Self-pay

## 2021-08-07 ENCOUNTER — Telehealth: Payer: Self-pay

## 2021-08-07 MED ORDER — FASENRA PEN 30 MG/ML ~~LOC~~ SOAJ
30.0000 mg | SUBCUTANEOUS | 2 refills | Status: DC
  Filled 2021-08-07: qty 1, 56d supply, fill #0
  Filled 2021-09-06: qty 1, 56d supply, fill #1
  Filled 2021-11-03: qty 1, 56d supply, fill #2

## 2021-08-07 NOTE — Telephone Encounter (Signed)
Per [place], patient is due to renew Xolair PA.  Per CMM, PA renewal cannot be initiated at this time as patient will be able to get a refill before current PA expiration. Per previous note, PA is still good until 08/18/21. Will attempt renewal later in month.

## 2021-08-07 NOTE — Telephone Encounter (Signed)
Patient last seen by [name] on 05/10/21 with plan to continue Fasenra in combination with Xolair.  F/u not yet scheduled.  Refill for Fasenra sent to [facility]  Knox Saliva, PharmD, MPH, BCPS Clinical Pharmacist (Rheumatology and Pulmonology)

## 2021-08-08 ENCOUNTER — Telehealth: Payer: Self-pay

## 2021-08-08 ENCOUNTER — Other Ambulatory Visit (HOSPITAL_COMMUNITY): Payer: Self-pay

## 2021-08-08 NOTE — Telephone Encounter (Signed)
[facility] Patient Advocate Encounter  Prior Authorization for Azitromycin has been approved.    PA# [id number] Effective dates: 08/08/21 through 08/08/22  Patients co-pay is $0.00.   Approved for 30 tablets for 30 days.  [facility] Clinic will continue to follow.  Ileene Patrick, [facility] Patient [facility] Phone: [phone] Fax:  [phone]

## 2021-08-09 ENCOUNTER — Telehealth: Payer: Self-pay

## 2021-08-09 ENCOUNTER — Other Ambulatory Visit (HOSPITAL_COMMUNITY): Payer: Self-pay

## 2021-08-09 NOTE — Telephone Encounter (Signed)
[facility] in [place], [place] - notified PA for Azithromycin was approved.   Patient notified as well and verbalized his understanding.   [place], CMA

## 2021-08-11 ENCOUNTER — Other Ambulatory Visit (HOSPITAL_COMMUNITY): Payer: Self-pay

## 2021-08-12 ENCOUNTER — Other Ambulatory Visit: Payer: Self-pay | Admitting: Adult Health

## 2021-08-14 ENCOUNTER — Telehealth: Payer: Self-pay | Admitting: Adult Health

## 2021-08-14 ENCOUNTER — Other Ambulatory Visit (HOSPITAL_COMMUNITY): Payer: Self-pay

## 2021-08-14 MED ORDER — BREZTRI AEROSPHERE 160-9-4.8 MCG/ACT IN AERO: 2.0000 | INHALATION_SPRAY | Freq: Two times a day (BID) | RESPIRATORY_TRACT | 6 refills | Status: AC

## 2021-08-14 NOTE — Telephone Encounter (Signed)
Call made to patient, confirmed DOB. Confirmed medication and pharmacy. Refill sent. Voiced understanding.   Nothing further needed at this time.

## 2021-08-21 NOTE — Telephone Encounter (Signed)
Received a fax regarding Prior Authorization from [facility] for XOLAIR. Authorization has been DENIED because Xolair is being taken with another biologic [name])  Awaiting denial letter for full details.

## 2021-08-21 NOTE — Telephone Encounter (Signed)
Submitted a Prior Authorization request to [facility] for XOLAIR via CoverMyMeds. Will update once we receive a response.   Key: [id number]

## 2021-08-23 NOTE — Telephone Encounter (Signed)
Submitted most recent OV notes for re-review by clinical  team at [facility]. This is recommended step prior to appeal. Also placed note requesting auth be approved for longer than 3 months.  Fax: [phone] Phone: [phone] Ref # : [id number]  Called patient to advise that Xolair re-auth was again denied for same reason (being on concomitant Fasenra). He has a dose of Xolair that he will be taking today, 08/23/21, but this is his last dose. Advised him to reach out to pharmacy team on 09/04/21 to set aside sample of Xolair for him if we have not yet heard from insurance regarding Xolair approval.   Knox Saliva, PharmD, MPH, BCPS Clinical Pharmacist (Rheumatology and Pulmonology)

## 2021-08-24 DIAGNOSIS — J479 Bronchiectasis, uncomplicated: Secondary | ICD-10-CM | POA: Diagnosis not present

## 2021-08-29 DIAGNOSIS — R0902 Hypoxemia: Secondary | ICD-10-CM | POA: Diagnosis not present

## 2021-08-29 DIAGNOSIS — A312 Disseminated mycobacterium avium-intracellulare complex (DMAC): Secondary | ICD-10-CM | POA: Diagnosis not present

## 2021-08-29 DIAGNOSIS — B4481 Allergic bronchopulmonary aspergillosis: Secondary | ICD-10-CM | POA: Diagnosis not present

## 2021-08-29 DIAGNOSIS — B449 Aspergillosis, unspecified: Secondary | ICD-10-CM | POA: Diagnosis not present

## 2021-08-30 ENCOUNTER — Ambulatory Visit: Payer: BC Managed Care – PPO | Admitting: Infectious Disease

## 2021-08-30 NOTE — Telephone Encounter (Signed)
Called [employer] to follow up on re-determination. They did not see where they received. Confirmed fax number and re-faxed to plan. Will follow up.  Phone# [phone] Fax# [phone]

## 2021-09-01 DIAGNOSIS — J479 Bronchiectasis, uncomplicated: Secondary | ICD-10-CM | POA: Diagnosis not present

## 2021-09-05 NOTE — Telephone Encounter (Signed)
Returned call to patient. He will pick up Xolair sample today from clinic.  Dose: 346m every 14 days  DKnox Saliva PharmD, MPH, BCPS Clinical Pharmacist (Rheumatology and Pulmonology)

## 2021-09-05 NOTE — Telephone Encounter (Signed)
Routing to correct Pool. Patient left voicemail requesting samples. He is due to inject tomorrow- 10/19.

## 2021-09-05 NOTE — Telephone Encounter (Signed)
Patient came by office to pick up samples.

## 2021-09-05 NOTE — Telephone Encounter (Signed)
Labeled Xolair samples set aside in Pyxis fridge in infusion center

## 2021-09-05 NOTE — Telephone Encounter (Signed)
Patient checking on PA for Xolair injection. Patient phone number is [phone].

## 2021-09-06 ENCOUNTER — Telehealth: Payer: Self-pay

## 2021-09-06 ENCOUNTER — Other Ambulatory Visit (HOSPITAL_COMMUNITY): Payer: Self-pay

## 2021-09-06 NOTE — Telephone Encounter (Signed)
PA for [place] for th the patient initiated on 09/06/21 through CoverMyMeds.   FRT:[id number] Awaiting approval

## 2021-09-08 NOTE — Telephone Encounter (Signed)
PA for the patient approved 09/08/21-09/08/22. Approval faxed to [facility].  Reference number [id number]. Mikala Podoll T Brooks Sailors

## 2021-09-11 ENCOUNTER — Other Ambulatory Visit (HOSPITAL_COMMUNITY): Payer: Self-pay

## 2021-09-11 MED FILL — Omalizumab Subcutaneous Soln Prefilled Syringe 150 MG/ML: SUBCUTANEOUS | 28 days supply | Qty: 4 | Fill #6 | Status: CN

## 2021-09-11 MED FILL — Omalizumab Subcutaneous Soln Prefilled Syringe 75 MG/0.5ML: SUBCUTANEOUS | 28 days supply | Qty: 1 | Fill #6 | Status: CN

## 2021-09-12 ENCOUNTER — Other Ambulatory Visit (HOSPITAL_COMMUNITY): Payer: Self-pay

## 2021-09-12 NOTE — Telephone Encounter (Signed)
Called [place] for f/u on patient's Xolair re-review. Per reps, they have not received any faxes despite pharmacy team confirming fax number. They state that they are unable to receive any further documents for re-review since it has been more than 10 business days since denial.   Will have to pursue appeal. Submitted an URGENT appeal to [facility] for XOLAIR.  Reference # [id number] Phone: [phone] Fax: [phone]  Knox Saliva, PharmD, MPH, BCPS Clinical Pharmacist (Rheumatology and Pulmonology)

## 2021-09-15 ENCOUNTER — Telehealth: Payer: Self-pay | Admitting: Internal Medicine

## 2021-09-18 ENCOUNTER — Other Ambulatory Visit (HOSPITAL_COMMUNITY): Payer: Self-pay

## 2021-09-18 MED FILL — Omalizumab Subcutaneous Soln Prefilled Syringe 75 MG/0.5ML: SUBCUTANEOUS | 28 days supply | Qty: 1 | Fill #6 | Status: AC

## 2021-09-18 MED FILL — Omalizumab Subcutaneous Soln Prefilled Syringe 150 MG/ML: SUBCUTANEOUS | 28 days supply | Qty: 4 | Fill #6 | Status: AC

## 2021-09-18 NOTE — Telephone Encounter (Addendum)
Received notification from [facility] regarding a prior authorization appeal for [place]. Appeal has been overturned and APPROVED from 09/15/2021 to 09/15/2022 (actual duration not specified in approval letter).   Per test claim, copay for 28 day supply is $0.00 for both strengths of medication.  Patient can continue to fill through [place]: [phone]   Authorization # [id number]  Faxed approval letter sent to scan center.  Refills have been processed at [facility] and fill has been initiated. PA information updated in [place], Xolair refill activity added and completed with delivery instructions.  Nothing further needed at this time.

## 2021-09-18 NOTE — Telephone Encounter (Signed)
Information consolidated to encounter already in-progress. Closing encounter.

## 2021-09-19 ENCOUNTER — Other Ambulatory Visit (HOSPITAL_COMMUNITY): Payer: Self-pay

## 2021-09-19 ENCOUNTER — Telehealth: Payer: Self-pay | Admitting: Internal Medicine

## 2021-09-19 NOTE — Telephone Encounter (Signed)
Received notification from [facility] regarding a prior authorization appeal for [place]. Appeal has been overturned and APPROVED from 09/15/2021 to 12/12/2021 (actual duration not specified in approval letter but called insurance to confirm).  Per test claim, copay for 28 day supply is $0.00 for both strengths of medication.  Patient can continue to fill through [place]: [phone]   Authorization # [id number]  Faxed approval letter sent to scan center.  Refills have been processed at [facility] and fill has been initiated. PA information updated in [place], Xolair refill activity added and completed with delivery instructions.  Nothing further needed at this time.

## 2021-09-19 NOTE — Telephone Encounter (Signed)
Returned call to patient and advise him to reach out to [facility]. Reviewed that pharmacy shipped [place] yesterday so may be reaching his home today.  Knox Saliva, PharmD, MPH, BCPS Clinical Pharmacist (Rheumatology and Pulmonology)

## 2021-09-20 ENCOUNTER — Other Ambulatory Visit (HOSPITAL_COMMUNITY): Payer: Self-pay

## 2021-09-24 DIAGNOSIS — J479 Bronchiectasis, uncomplicated: Secondary | ICD-10-CM | POA: Diagnosis not present

## 2021-09-27 ENCOUNTER — Ambulatory Visit: Payer: BC Managed Care – PPO | Admitting: Infectious Disease

## 2021-09-27 DIAGNOSIS — Z23 Encounter for immunization: Secondary | ICD-10-CM | POA: Diagnosis not present

## 2021-09-27 DIAGNOSIS — Z79899 Other long term (current) drug therapy: Secondary | ICD-10-CM | POA: Diagnosis not present

## 2021-09-27 DIAGNOSIS — Z681 Body mass index (BMI) 19 or less, adult: Secondary | ICD-10-CM | POA: Diagnosis not present

## 2021-09-27 DIAGNOSIS — F329 Major depressive disorder, single episode, unspecified: Secondary | ICD-10-CM | POA: Diagnosis not present

## 2021-09-27 DIAGNOSIS — Z0000 Encounter for general adult medical examination without abnormal findings: Secondary | ICD-10-CM | POA: Diagnosis not present

## 2021-09-27 DIAGNOSIS — Z0001 Encounter for general adult medical examination with abnormal findings: Secondary | ICD-10-CM | POA: Diagnosis not present

## 2021-09-27 DIAGNOSIS — Z13220 Encounter for screening for lipoid disorders: Secondary | ICD-10-CM | POA: Diagnosis not present

## 2021-09-27 DIAGNOSIS — R634 Abnormal weight loss: Secondary | ICD-10-CM | POA: Diagnosis not present

## 2021-09-29 DIAGNOSIS — R0902 Hypoxemia: Secondary | ICD-10-CM | POA: Diagnosis not present

## 2021-09-29 DIAGNOSIS — B4481 Allergic bronchopulmonary aspergillosis: Secondary | ICD-10-CM | POA: Diagnosis not present

## 2021-09-29 DIAGNOSIS — B449 Aspergillosis, unspecified: Secondary | ICD-10-CM | POA: Diagnosis not present

## 2021-09-29 DIAGNOSIS — A312 Disseminated mycobacterium avium-intracellulare complex (DMAC): Secondary | ICD-10-CM | POA: Diagnosis not present

## 2021-10-02 DIAGNOSIS — J479 Bronchiectasis, uncomplicated: Secondary | ICD-10-CM | POA: Diagnosis not present

## 2021-10-04 ENCOUNTER — Ambulatory Visit (INDEPENDENT_AMBULATORY_CARE_PROVIDER_SITE_OTHER): Payer: BC Managed Care – PPO

## 2021-10-04 ENCOUNTER — Encounter: Payer: Self-pay | Admitting: Infectious Disease

## 2021-10-04 ENCOUNTER — Ambulatory Visit (INDEPENDENT_AMBULATORY_CARE_PROVIDER_SITE_OTHER): Payer: BC Managed Care – PPO | Admitting: Infectious Disease

## 2021-10-04 ENCOUNTER — Other Ambulatory Visit: Payer: Self-pay

## 2021-10-04 VITALS — BP 119/77 | HR 95 | Temp 98.2°F | Ht 68.0 in | Wt 119.0 lb

## 2021-10-04 DIAGNOSIS — A310 Pulmonary mycobacterial infection: Secondary | ICD-10-CM

## 2021-10-04 DIAGNOSIS — B181 Chronic viral hepatitis B without delta-agent: Secondary | ICD-10-CM

## 2021-10-04 DIAGNOSIS — Z23 Encounter for immunization: Secondary | ICD-10-CM | POA: Diagnosis not present

## 2021-10-04 DIAGNOSIS — B449 Aspergillosis, unspecified: Secondary | ICD-10-CM

## 2021-10-04 DIAGNOSIS — B440 Invasive pulmonary aspergillosis: Secondary | ICD-10-CM | POA: Diagnosis not present

## 2021-10-04 MED ORDER — AZITHROMYCIN 500 MG PO TABS: 500.0000 mg | ORAL_TABLET | Freq: Every day | ORAL | 11 refills | Status: DC

## 2021-10-04 MED ORDER — CRESEMBA 186 MG PO CAPS
2.0000 | ORAL_CAPSULE | Freq: Every day | ORAL | 11 refills | Status: DC
  Filled 2021-10-04: qty 56, 28d supply, fill #0
  Filled 2021-12-12: qty 56, 28d supply, fill #1
  Filled 2022-01-22: qty 56, 28d supply, fill #2
  Filled 2022-03-19: qty 56, 28d supply, fill #3

## 2021-10-04 MED ORDER — ETHAMBUTOL HCL 400 MG PO TABS: ORAL_TABLET | ORAL | 5 refills | Status: DC

## 2021-10-04 NOTE — Progress Notes (Signed)
   Covid-19 Vaccination Clinic  Name:  ERNAN RUNKLES    MRN: [id number] DOB: 02-14-1976  10/04/2021  Mr. Dewalt was observed post Covid-19 immunization for 15 minutes without incident. He was provided with Vaccine Information Sheet and instruction to access the V-Safe system.   Mr. Delaughter was instructed to call 911 with any severe reactions post vaccine: Difficulty breathing  Swelling of face and throat  A fast heartbeat  A bad rash all over body  Dizziness and weakness   Immunizations Administered     Name Date Dose VIS Date Route   Pfizer Covid-19 Vaccine Bivalent Booster 10/04/2021  3:48 PM 0.3 mL 07/19/2021 Intramuscular   Manufacturer: [place]   Lot: [id number]   [place]: [place], RN

## 2021-10-04 NOTE — Progress Notes (Signed)
Chief complaint:  coughing worse with seasons changing  Subjective:    : Patient ID: [name], male    DOB: 08-21-1976, 45 y.o.   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 We had taken him off his Mycobacterium avium drugs and then he developed fevers chills and malaise  Spring (2013) and was seen by my partner Dr. Megan Salon in late April  His AFB smears and cultures obtained in the clinic on that day prior to the initiation of azithromycin ethambutol ultimately failed to grow Mycobacterium avium. He had been doing well I saw him in [place] 2013 we discontinued his azithromycin and ethambutol again. Unfortunately he again shortly thereafter developed fevers cough malaise. He started back on azithromycin ethambutol. Cultures done for AFB here were again negative. The patient however felt dramatically better having been back on azithromycin and ethambutol.   I saw him in January of 2015 when he was feeling relatively well.  He did have an episode of coughing up a few flecks of blood in January 2016 which prompted appt with [facility] but by time he made appt this had long since resolved. Was only a one time episode none since then.  I saw again in March and then in September 2016. In summer he was hospitalized with hemoptysis and found to have an area in the upper lobe consistent with an aspergilloma. He underwent bronchoscopy with BAL and cultures ultimately yielded a Saccharomyces species on fungal culture. His serum galactomannan was negative.  He had been  on voriconazole and axial has had radiographic improvement and his x-ray findings. He also feelt etter symptomatically with no more  hemoptysis improvement in his cough improvement in his energy though he still feels not nearly as well as he did prior to his hospitalization. He is avoiding exercised during the winter as it often precipitates coughing spells. He was  only taking 200 mg twice a day of voriconazole rather than the 300 mg he has continued this along with his ethambutol and azithromycin. He continued to have cough more so with exercise and has not been exercising at "[employer]" due to the fact this elicits more coughing. He has not smoked since 2007. He is seeing [name] and is being considered for new drug infusion to treat his COPD.  He did have 20# weight loss that he attributed to increased exercise.  He then saw  [name] after an episode of hematemesis. 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 He said he only coughed up a small amount of blood material and did not go to the ER but was seen and followed by pulmonary. Had a chest x-ray which showed no new findings.  He was then  going to be evaluated by the transplant team at [facility].  He did complain of hyperpigmented spots on sun exposed areas on his body including arms and neck which his dermatologist have told him is due to his voriconazole use.   We considered change at that time  Since that time he has [place] been admitted with hemoptysis at [facility].   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 There is  interval development of fluid density in several bulla in the left upper lobe concerning for superimposed infection, possibly fungal in origin.   Stable bilateral calcified pulmonary nodules are noted consistent with prior granulomatous disease.   Stable tree in bud appearance is noted medially in the left lung posteriorly consistent with sequela of previous atypical infection.   Due to his skin toxicity we decided to change him to [facility] and he has  been on that for some time now.  He had problems with bilateral pneumothoraces.  This required placement of an intrabronchial valve in November 2019.  This resolved but then he had to be taken back to the operating room in early January where the intrabronchial valves were removed.  Was seen at [facility] medical transplant team as well but felt not yet to be a transplant candidate due to the fact that his lungs were not in severe enough condition to warrant transplantation   He had applied for disability but this has not yet gone through.  Certainly he deserves to have disability and he has significant medical problems.  He did however eventually go back to work at [employer]  He is been receiving his antimycobacterial drugs and is [place].  I last saw cans he was hospitalized in July 2021  for what he says was pneumonia.  The CT scan was read as showing:  " Chronic narrowing and occlusion of several right upper lobe segmental subsegmental pulmonary arteries and filling defects in the left upper lobe segmental and subsegmental branches which were within the regions of bullous disease which are suggestive of chronic embolism/occlusion.  It showed extensive bullous and paraseptal predominant emphysematous changes with thick-walled cystic bullae toward the apices with nodularity and air layering air-fluid levels within the left apical bulla consistent with aspergilloma.  He had mixed areas of groundglass and consolidative opacity left lower lobe and right lower lobe periphery.  Nares note indicates that he was not anticoagulated due to the fact that he has recurrent hemoptysis per the patient himself was unaware that he was found to have anything suggestive clots on scan and believes he was only treated for pneumonia.  Since I last saw Arlan he was treated with Augmentin and corticosteroids for flare of bronchitis.  He improved on both steroids and antibiotics but within 3 to 5 days after  coming off the Augmentin had worsening cough with productive sputum particularly in the morning some with some blood streaking.  He had seen pulmonary who had recommended him come see [place] again and I saw him in November  He hadnot had fevers chills or other systemic symptoms.  He was apprehensive that he could be harboring resistant organisms.Out of his lungs.  Get I think there is certainly possibility that his mycobacteria could have developed some resistance and/or that he has a superimposed bacterial infection yet again.  At the time we switched out his azithromycin for levofloxacin.  His cough may have improved a bit in terms of his severity.  He says about 10 out of 10 in severity when he saw [place] last versus a 6-7.  Sputum is changed slightly now with a red tinge to it.  He was also seen by hematology oncology to work-up anemia at [facility].  In the context of his weight loss they also checked him for viral hepatitis panel and [place] hepatitis B surface antigen was positive along with a hepatitis B DNA though at a low value value of 2050 copies  Since seen [name] and is starting Xolair in addition to the [place] he is already taking along with systemic prednisone (in midst of a taper)  We checked for delta agent this was negative.  We checked hepatitis B E antigen antibody which was positive.  We did an ultrasound screening for hepatocellular carcinoma and this was negative for any lesions.  He is also seen GI and hepatology at [facility].  There are plans for both an upper endoscopy and lower endoscopy to evaluate his anemia.    Cannot have been using a percussive vest but then suffered rib fractures from this.  He is happy that he has gone nearly 3 years without being admitted to the hospital with hemoptysis.   He has had to cut down on hours at work.  He does still have oxygen at home that he occasionally uses if he has coughing fits or sometimes  at night.  He continues to follow closely with [name].  Past Medical History:  Diagnosis Date   Anemia    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[place])    Chronic viral hepatitis B without delta-agent ([place]) 11/23/2020   Drug rash 12/16/2017   Dyspnea    Esophageal reflux    very rare   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   Lung disease, bullous ([place])    MAI (mycobacterium avium-intracellulare) ([place])    Mold exposure 12/05/2015   Photosensitivity dermatitis 09/16/2017   Rib fracture 02/08/2021   Solar lentigo 08/08/2015   Vaccine counseling 12/28/2020   Weight loss 10/15/2016   Past Surgical History:  Procedure Laterality Date   CHEST TUBE INSERTION Right 09/29/2018   Procedure: CHEST TUBE REPLACEMENT;  Surgeon: Melrose Nakayama, MD;  Location: [place];  Service: Thoracic;  Laterality: Right;   INSERTION OF IBV VALVE N/A 09/19/2018   Procedure: INSERTION OF INTERBRONCHIAL VALVE (IBV);  Surgeon: Melrose Nakayama, MD;  Location[facility] OR;  Service: Thoracic;  Laterality: N/A;   INSERTION OF IBV VALVE N/A 12/01/2018   Procedure: REMOVAL OF INTERBRONCHIAL VALVE (IBV);  Surgeon: Melrose Nakayama, MD;  Location: [facility] OR;  Service: Thoracic;  Laterality: N/A;   left VATS  2012   thoracotomy and LUL apical posterior segmentectomy   STAPLING OF BLEBS Right 09/12/2018   Procedure: STAPLING OF BLEBS, right lower and middle lung lobes;  Surgeon: Melrose Nakayama, MD;  Location: [place];  Service: Thoracic;  Laterality: Right;   VIDEO ASSISTED THORACOSCOPY Right 09/12/2018   Procedure: VIDEO ASSISTED THORACOSCOPY, right lung;  Surgeon: Melrose Nakayama, MD;  Location: [place];  Service: Thoracic;  Laterality: Right;   VIDEO BRONCHOSCOPY Bilateral 10/20/2015   Procedure: VIDEO BRONCHOSCOPY WITHOUT FLUORO;  Surgeon: Marshell Garfinkel, MD;  Location: [place];  Service: Cardiopulmonary;  Laterality: Bilateral;   VIDEO BRONCHOSCOPY N/A 09/19/2018   Procedure: VIDEO BRONCHOSCOPY;   Surgeon: Melrose Nakayama, MD;  Location: [place];  Service: Thoracic;  Laterality: N/A;   VIDEO BRONCHOSCOPY N/A 12/01/2018   Procedure: VIDEO BRONCHOSCOPY;  Surgeon: Melrose Nakayama, MD;  Location: [place];  Service: Thoracic;  Laterality: N/A;   VIDEO BRONCHOSCOPY WITH INSERTION OF INTERBRONCHIAL VALVE (IBV) N/A 09/29/2018   Procedure: VIDEO BRONCHOSCOPY WITH INSERTION OF INTERBRONCHIAL VALVE  (IBV);  Surgeon: Melrose Nakayama, MD;  Location: [facility] OR;  Service: Thoracic;  Laterality: N/A;   Family History  Adopted: Yes   Social History   Tobacco Use   Smoking status: Former    Packs/day: 0.70  Years: 19.00    Pack years: 13.30    Types: Cigarettes    Quit date: 11/20/2007    Years since quitting: 13.8   Smokeless tobacco: Former    Types: Snuff    Quit date: 11/19/1994  Vaping Use   Vaping Use: Never used  Substance Use Topics   Alcohol use: No   Drug use: No    Allergies  Allergen Reactions   Clarithromycin Other (See Comments)    Adverse reaction w/ voriconazole UNSPECIFIED REACTION    Rifaximin Other (See Comments)    Adverse reaction w/ voriconazole UNSPECIFIED REACTION     Current Outpatient Medications:    albuterol (PROVENTIL) (2.5 MG/3ML) 0.083% nebulizer solution, TAKE 3 MLS BY NEBULIZATION EVERY 6 HOURS AS NEEDED FOR WHEEZING OR SHORTNESS OF BREATHE, Disp: 75 mL, Rfl: 12   albuterol (VENTOLIN HFA) 108 (90 Base) MCG/ACT inhaler, Inhale 2 puffs into the lungs every 6 (six) hours as needed., Disp: 18 g, Rfl: 2   Benralizumab (FASENRA PEN) 30 MG/ML SOAJ, Inject 1 mL (30 mg total) into the skin every 8 (eight) weeks., Disp: 1 mL, Rfl: 2   Calcium Carbonate-Vitamin D 600-400 MG-UNIT tablet, Take 1 tablet by mouth daily. , Disp: , Rfl:    feeding supplement, ENSURE ENLIVE, (ENSURE ENLIVE) LIQD, Take 237 mLs by mouth 3 (three) times daily between meals. (Patient taking differently: Take 237 mLs by mouth 2 (two) times daily between meals.), Disp: 237 mL, Rfl:  12   FEROSUL 325 (65 Fe) MG tablet, Take 325 mg by mouth every morning., Disp: , Rfl:    folic acid (FOLVITE) 1 MG tablet, Take 1 mg by mouth daily., Disp: , Rfl:    guaiFENesin (MUCINEX) 600 MG 12 hr tablet, Take 1 tablet (600 mg total) by mouth 2 (two) times daily., Disp: 30 tablet, Rfl: 0   levothyroxine (SYNTHROID) 100 MCG tablet, Take 100 mcg by mouth daily., Disp: , Rfl:    mirtazapine (REMERON SOL-TAB) 15 MG disintegrating tablet, Take by mouth., Disp: , Rfl:    omalizumab (XOLAIR) 150 MG/ML prefilled syringe, INJECT 300 MG INTO THE SKIN EVERY 14 (FOURTEEN) DAYS. IN ADDITION TO 75MG (TOTAL DOSE IS 375MG EVERY 14 DAYS)., Disp: 4 mL, Rfl: 11   predniSONE (DELTASONE) 10 MG tablet, Take 1 tablet (10 mg total) by mouth daily with breakfast., Disp: 90 tablet, Rfl: 3   sodium chloride HYPERTONIC 3 % nebulizer solution, Take by nebulization daily. Dx: J47.9, Disp: 120 mL, Rfl: 11   azithromycin (ZITHROMAX) 500 MG tablet, Take 1 tablet (500 mg total) by mouth daily., Disp: 30 tablet, Rfl: 11   EPINEPHrine 0.3 mg/0.3 mL IJ SOAJ injection, Inject 0.3 mg into the muscle once for 1 dose., Disp: 1 each, Rfl: 11   ethambutol (MYAMBUTOL) 400 MG tablet, TAKE 2 AND 1/2 TABLETS BY MOUTH ONCE DAILY, Disp: 598 tablet, Rfl: 5   Isavuconazonium Sulfate (CRESEMBA) 186 MG CAPS, TAKE 2 CAPSULES BY MOUTH DAILY, Disp: 60 capsule, Rfl: 11   lidocaine (LIDODERM) 5 %, Place 1 patch onto the skin daily as needed (pain). Remove & Discard patch within 12 hours or as directed by MD (Patient not taking: Reported on 05/10/2021), Disp: 30 patch, Rfl: 0   omalizumab (XOLAIR) 75 MG/0.5ML prefilled syringe, INJECT 75 MG INTO THE SKIN EVERY 14 (FOURTEEN) DAYS. IN ADDITION TO 300MG (TOTAL DOSE IS 375MG EVERY 14 DAYS). DELIVER TO PATIENT'S HOME., Disp: 1 mL, Rfl: 11    Review of Systems  Constitutional:  Negative for activity  change, appetite change, chills, diaphoresis, fatigue, fever and unexpected weight change.  HENT:  Negative  for congestion, rhinorrhea, sinus pressure, sneezing, sore throat and trouble swallowing.   Eyes:  Negative for photophobia and visual disturbance.  Respiratory:  Positive for cough. Negative for chest tightness, shortness of breath, wheezing and stridor.   Cardiovascular:  Negative for chest pain, palpitations and leg swelling.  Gastrointestinal:  Negative for abdominal distention, abdominal pain, anal bleeding, blood in stool, constipation, diarrhea, nausea and vomiting.  Genitourinary:  Negative for difficulty urinating, dysuria, flank pain and hematuria.  Musculoskeletal:  Negative for arthralgias, back pain, gait problem, joint swelling and myalgias.  Skin:  Negative for color change, pallor, rash and wound.  Neurological:  Negative for dizziness, tremors, weakness and light-headedness.  Hematological:  Negative for adenopathy. Does not bruise/bleed easily.  Psychiatric/Behavioral:  Negative for agitation, behavioral problems, confusion, decreased concentration, dysphoric mood and sleep disturbance.       Objective:   Physical Exam Constitutional:      Appearance: He is well-developed.  HENT:     Head: Normocephalic and atraumatic.  Eyes:     Conjunctiva/sclera: Conjunctivae normal.  Cardiovascular:     Rate and Rhythm: Normal rate and regular rhythm.  Pulmonary:     Effort: Pulmonary effort is normal. Prolonged expiration present. No respiratory distress.     Breath sounds: No stridor. No wheezing or rhonchi.  Abdominal:     General: There is no distension.     Palpations: Abdomen is soft.  Musculoskeletal:        General: No tenderness. Normal range of motion.     Cervical back: Normal range of motion and neck supple.  Skin:    General: Skin is warm and dry.     Coloration: Skin is not pale.     Findings: Rash present. No erythema.  Neurological:     General: No focal deficit present.     Mental Status: He is alert and oriented to person, place, and time.  Psychiatric:         Mood and Affect: Mood normal.        Behavior: Behavior normal.        Thought Content: Thought content normal.        Judgment: Judgment normal.         Assessment & Plan:   Aspergilloma:  I am checking CBC with differential CMP   I am continuing his lifelong Cresemba  Allergic bronchopulmonary aspergillosis: On Cresemba and had been on steroids  Rash from voriconazole: Changed to Cresemba but still does persist.  Mycobacterium Avium:   I am continue his prescriptions for ethambutol and azithromycin   Sun exposed rash from voriconozole: We changed to Cresemba and it seems improved but has persisted  Chronic hepatitis B without hepatic coma:  I am checking hepatitis B DNA and a CMP today he has hepatitis B he antigen antibody positive   I am checking an ultrasound right upper quadrant to screen for hepatocellular carcinoma  Vaccine counseling: he was given bivalent COVID vaccine

## 2021-10-05 ENCOUNTER — Other Ambulatory Visit (HOSPITAL_COMMUNITY): Payer: Self-pay

## 2021-10-06 ENCOUNTER — Other Ambulatory Visit (HOSPITAL_COMMUNITY): Payer: Self-pay

## 2021-10-06 LAB — HEPATITIS B DNA, ULTRAQUANTITATIVE, PCR
Hepatitis B DNA (Calc): 3.37 Log IU/mL — ABNORMAL HIGH
Hepatitis B DNA: 2370 IU/mL — ABNORMAL HIGH

## 2021-10-06 LAB — CBC WITH DIFFERENTIAL/PLATELET
Absolute Monocytes: 246 cells/uL (ref 200–950)
Basophils Absolute: 11 cells/uL (ref 0–200)
Basophils Relative: 0.1 %
Eosinophils Absolute: 0 cells/uL — ABNORMAL LOW (ref 15–500)
Eosinophils Relative: 0 %
HCT: 38.9 % (ref 38.5–50.0)
Hemoglobin: 13.4 g/dL (ref 13.2–17.1)
Lymphs Abs: 353 cells/uL — ABNORMAL LOW (ref 850–3900)
MCH: 32.5 pg (ref 27.0–33.0)
MCHC: 34.4 g/dL (ref 32.0–36.0)
MCV: 94.4 fL (ref 80.0–100.0)
MPV: 8.9 fL (ref 7.5–12.5)
Monocytes Relative: 2.3 %
Neutro Abs: 10090 cells/uL — ABNORMAL HIGH (ref 1500–7800)
Neutrophils Relative %: 94.3 %
Platelets: 270 10*3/uL (ref 140–400)
RBC: 4.12 10*6/uL — ABNORMAL LOW (ref 4.20–5.80)
RDW: 12.6 % (ref 11.0–15.0)
Total Lymphocyte: 3.3 %
WBC: 10.7 10*3/uL (ref 3.8–10.8)

## 2021-10-06 LAB — COMPLETE METABOLIC PANEL WITH GFR
AG Ratio: 1.2 (calc) (ref 1.0–2.5)
ALT: 15 U/L (ref 9–46)
AST: 22 U/L (ref 10–40)
Albumin: 3.6 g/dL (ref 3.6–5.1)
Alkaline phosphatase (APISO): 95 U/L (ref 36–130)
BUN/Creatinine Ratio: 7 (calc) (ref 6–22)
BUN: 6 mg/dL — ABNORMAL LOW (ref 7–25)
CO2: 27 mmol/L (ref 20–32)
Calcium: 8.6 mg/dL (ref 8.6–10.3)
Chloride: 101 mmol/L (ref 98–110)
Creat: 0.83 mg/dL (ref 0.60–1.29)
Globulin: 3 g/dL (calc) (ref 1.9–3.7)
Glucose, Bld: 125 mg/dL — ABNORMAL HIGH (ref 65–99)
Potassium: 4.2 mmol/L (ref 3.5–5.3)
Sodium: 138 mmol/L (ref 135–146)
Total Bilirubin: 0.5 mg/dL (ref 0.2–1.2)
Total Protein: 6.6 g/dL (ref 6.1–8.1)
eGFR: 109 mL/min/{1.73_m2} (ref >=60)

## 2021-10-09 ENCOUNTER — Other Ambulatory Visit (HOSPITAL_COMMUNITY): Payer: Self-pay

## 2021-10-16 ENCOUNTER — Other Ambulatory Visit (HOSPITAL_COMMUNITY): Payer: Self-pay

## 2021-10-16 MED FILL — Omalizumab Subcutaneous Soln Prefilled Syringe 150 MG/ML: SUBCUTANEOUS | 28 days supply | Qty: 4 | Fill #7 | Status: AC

## 2021-10-16 MED FILL — Omalizumab Subcutaneous Soln Prefilled Syringe 75 MG/0.5ML: SUBCUTANEOUS | 28 days supply | Qty: 1 | Fill #7 | Status: AC

## 2021-10-17 ENCOUNTER — Other Ambulatory Visit (HOSPITAL_COMMUNITY): Payer: Self-pay

## 2021-10-18 ENCOUNTER — Ambulatory Visit
Admission: RE | Admit: 2021-10-18 | Discharge: 2021-10-18 | Disposition: A | Discharge status: Home / Self Care | Payer: BC Managed Care – PPO | Source: Ambulatory Visit | Attending: Infectious Disease | Admitting: Infectious Disease

## 2021-10-18 DIAGNOSIS — K8020 Calculus of gallbladder without cholecystitis without obstruction: Secondary | ICD-10-CM | POA: Diagnosis not present

## 2021-10-18 DIAGNOSIS — B449 Aspergillosis, unspecified: Secondary | ICD-10-CM

## 2021-10-18 DIAGNOSIS — Z136 Encounter for screening for cardiovascular disorders: Secondary | ICD-10-CM | POA: Diagnosis not present

## 2021-10-18 DIAGNOSIS — B440 Invasive pulmonary aspergillosis: Secondary | ICD-10-CM

## 2021-10-18 DIAGNOSIS — B181 Chronic viral hepatitis B without delta-agent: Secondary | ICD-10-CM

## 2021-10-24 DIAGNOSIS — J479 Bronchiectasis, uncomplicated: Secondary | ICD-10-CM | POA: Diagnosis not present

## 2021-10-29 DIAGNOSIS — R0902 Hypoxemia: Secondary | ICD-10-CM | POA: Diagnosis not present

## 2021-10-29 DIAGNOSIS — A312 Disseminated mycobacterium avium-intracellulare complex (DMAC): Secondary | ICD-10-CM | POA: Diagnosis not present

## 2021-10-29 DIAGNOSIS — B449 Aspergillosis, unspecified: Secondary | ICD-10-CM | POA: Diagnosis not present

## 2021-10-29 DIAGNOSIS — B4481 Allergic bronchopulmonary aspergillosis: Secondary | ICD-10-CM | POA: Diagnosis not present

## 2021-11-01 DIAGNOSIS — F329 Major depressive disorder, single episode, unspecified: Secondary | ICD-10-CM | POA: Diagnosis not present

## 2021-11-01 DIAGNOSIS — R634 Abnormal weight loss: Secondary | ICD-10-CM | POA: Diagnosis not present

## 2021-11-03 ENCOUNTER — Other Ambulatory Visit (HOSPITAL_COMMUNITY): Payer: Self-pay

## 2021-11-03 ENCOUNTER — Other Ambulatory Visit: Payer: Self-pay | Admitting: Internal Medicine

## 2021-11-03 MED FILL — Omalizumab Subcutaneous Soln Prefilled Syringe 75 MG/0.5ML: SUBCUTANEOUS | 28 days supply | Qty: 1 | Fill #8 | Status: AC

## 2021-11-03 MED FILL — Omalizumab Subcutaneous Soln Prefilled Syringe 150 MG/ML: SUBCUTANEOUS | 28 days supply | Qty: 4 | Fill #8 | Status: AC

## 2021-11-07 ENCOUNTER — Other Ambulatory Visit (HOSPITAL_COMMUNITY): Payer: Self-pay

## 2021-11-08 ENCOUNTER — Other Ambulatory Visit (HOSPITAL_COMMUNITY): Payer: Self-pay

## 2021-11-15 ENCOUNTER — Other Ambulatory Visit (HOSPITAL_COMMUNITY): Payer: Self-pay

## 2021-11-17 ENCOUNTER — Other Ambulatory Visit (HOSPITAL_COMMUNITY): Payer: Self-pay

## 2021-11-24 DIAGNOSIS — J479 Bronchiectasis, uncomplicated: Secondary | ICD-10-CM | POA: Diagnosis not present

## 2021-11-29 DIAGNOSIS — B4481 Allergic bronchopulmonary aspergillosis: Secondary | ICD-10-CM | POA: Diagnosis not present

## 2021-11-29 DIAGNOSIS — A312 Disseminated mycobacterium avium-intracellulare complex (DMAC): Secondary | ICD-10-CM | POA: Diagnosis not present

## 2021-11-29 DIAGNOSIS — R0902 Hypoxemia: Secondary | ICD-10-CM | POA: Diagnosis not present

## 2021-11-29 DIAGNOSIS — B449 Aspergillosis, unspecified: Secondary | ICD-10-CM | POA: Diagnosis not present

## 2021-12-06 ENCOUNTER — Other Ambulatory Visit (HOSPITAL_COMMUNITY): Payer: Self-pay

## 2021-12-06 MED FILL — Omalizumab Subcutaneous Soln Prefilled Syringe 150 MG/ML: SUBCUTANEOUS | 28 days supply | Qty: 4 | Fill #9 | Status: AC

## 2021-12-06 MED FILL — Omalizumab Subcutaneous Soln Prefilled Syringe 75 MG/0.5ML: SUBCUTANEOUS | 28 days supply | Qty: 1 | Fill #9 | Status: AC

## 2021-12-07 ENCOUNTER — Other Ambulatory Visit (HOSPITAL_COMMUNITY): Payer: Self-pay

## 2021-12-08 ENCOUNTER — Other Ambulatory Visit (HOSPITAL_COMMUNITY): Payer: Self-pay

## 2021-12-12 ENCOUNTER — Other Ambulatory Visit (HOSPITAL_COMMUNITY): Payer: Self-pay

## 2021-12-13 ENCOUNTER — Other Ambulatory Visit (HOSPITAL_COMMUNITY): Payer: Self-pay

## 2021-12-25 DIAGNOSIS — J479 Bronchiectasis, uncomplicated: Secondary | ICD-10-CM | POA: Diagnosis not present

## 2021-12-29 ENCOUNTER — Other Ambulatory Visit (HOSPITAL_COMMUNITY): Payer: Self-pay

## 2021-12-29 ENCOUNTER — Other Ambulatory Visit: Payer: Self-pay | Admitting: Internal Medicine

## 2021-12-29 DIAGNOSIS — J4550 Severe persistent asthma, uncomplicated: Secondary | ICD-10-CM

## 2021-12-29 MED ORDER — FASENRA PEN 30 MG/ML ~~LOC~~ SOAJ
30.0000 mg | SUBCUTANEOUS | 2 refills | Status: DC
  Filled 2021-12-29: qty 1, 56d supply, fill #0
  Filled 2022-02-22: qty 1, 56d supply, fill #1
  Filled 2022-03-29: qty 1, 56d supply, fill #2

## 2021-12-29 MED FILL — Omalizumab Subcutaneous Soln Prefilled Syringe 150 MG/ML: SUBCUTANEOUS | 28 days supply | Qty: 4 | Fill #10 | Status: CN

## 2021-12-29 MED FILL — Omalizumab Subcutaneous Soln Prefilled Syringe 75 MG/0.5ML: SUBCUTANEOUS | 28 days supply | Qty: 1 | Fill #10 | Status: CN

## 2021-12-30 DIAGNOSIS — R0902 Hypoxemia: Secondary | ICD-10-CM | POA: Diagnosis not present

## 2021-12-30 DIAGNOSIS — B449 Aspergillosis, unspecified: Secondary | ICD-10-CM | POA: Diagnosis not present

## 2021-12-30 DIAGNOSIS — B4481 Allergic bronchopulmonary aspergillosis: Secondary | ICD-10-CM | POA: Diagnosis not present

## 2021-12-30 DIAGNOSIS — A312 Disseminated mycobacterium avium-intracellulare complex (DMAC): Secondary | ICD-10-CM | POA: Diagnosis not present

## 2022-01-04 ENCOUNTER — Telehealth: Payer: Self-pay

## 2022-01-04 ENCOUNTER — Other Ambulatory Visit (HOSPITAL_COMMUNITY): Payer: Self-pay

## 2022-01-04 NOTE — Telephone Encounter (Signed)
Received notification from [facility] that a new PA is required.  Submitted a Prior Authorization request to [employer] for [employer] via CoverMyMeds. Will update once we receive a response.  Key: [id number]   Historically the PA process requires an appeal due to pt also taking [name], pt also has not been seen by [facility] provider since 05/10/21.

## 2022-01-05 NOTE — Telephone Encounter (Signed)
Received notification that additional review by a clinical RPH is required to continue processing this request. A peer-to-peer review can be attempted in order to avoid receiving a denial.  Phone# [phone]

## 2022-01-08 ENCOUNTER — Other Ambulatory Visit (HOSPITAL_COMMUNITY): Payer: Self-pay

## 2022-01-08 NOTE — Telephone Encounter (Signed)
Returned call to patient. Patient advised to pick up sample from clinic today for Xolair while we work on re-upping his authorization via appeal  Knox Saliva, PharmD, MPH, BCPS Clinical Pharmacist (Rheumatology and Pulmonology)

## 2022-01-12 NOTE — Telephone Encounter (Signed)
Received a fax regarding Prior Authorization from [employer] for [place]. Authorization has been DENIED because patient is also taking other biologic [name]) which is not advised.  Can submit additional clinical information via fax within 10 business days of 01/06/22  Faxed additional clinical notes to [facility].  Fax: [phone] Phone: [phone]  Knox Saliva, PharmD, MPH, BCPS Clinical Pharmacist (Rheumatology and Pulmonology)

## 2022-01-15 ENCOUNTER — Other Ambulatory Visit (HOSPITAL_COMMUNITY): Payer: Self-pay

## 2022-01-16 ENCOUNTER — Other Ambulatory Visit (HOSPITAL_COMMUNITY): Payer: Self-pay

## 2022-01-16 MED FILL — Omalizumab Subcutaneous Soln Prefilled Syringe 75 MG/0.5ML: SUBCUTANEOUS | 28 days supply | Qty: 1 | Fill #10 | Status: CN

## 2022-01-16 MED FILL — Omalizumab Subcutaneous Soln Prefilled Syringe 150 MG/ML: SUBCUTANEOUS | 28 days supply | Qty: 4 | Fill #10 | Status: CN

## 2022-01-17 ENCOUNTER — Other Ambulatory Visit (HOSPITAL_COMMUNITY): Payer: Self-pay

## 2022-01-18 NOTE — Telephone Encounter (Signed)
Patient to stop by clinic today to pick up Xolair sample ?

## 2022-01-22 ENCOUNTER — Other Ambulatory Visit (HOSPITAL_COMMUNITY): Payer: Self-pay

## 2022-01-22 DIAGNOSIS — J479 Bronchiectasis, uncomplicated: Secondary | ICD-10-CM | POA: Diagnosis not present

## 2022-01-22 MED FILL — Omalizumab Subcutaneous Soln Prefilled Syringe 150 MG/ML: SUBCUTANEOUS | 28 days supply | Qty: 4 | Fill #10 | Status: CN

## 2022-01-22 MED FILL — Omalizumab Subcutaneous Soln Prefilled Syringe 75 MG/0.5ML: SUBCUTANEOUS | 28 days supply | Qty: 1 | Fill #10 | Status: CN

## 2022-01-23 ENCOUNTER — Other Ambulatory Visit (HOSPITAL_COMMUNITY): Payer: Self-pay

## 2022-01-27 DIAGNOSIS — B449 Aspergillosis, unspecified: Secondary | ICD-10-CM | POA: Diagnosis not present

## 2022-01-27 DIAGNOSIS — R0902 Hypoxemia: Secondary | ICD-10-CM | POA: Diagnosis not present

## 2022-01-27 DIAGNOSIS — A312 Disseminated mycobacterium avium-intracellulare complex (DMAC): Secondary | ICD-10-CM | POA: Diagnosis not present

## 2022-01-27 DIAGNOSIS — B4481 Allergic bronchopulmonary aspergillosis: Secondary | ICD-10-CM | POA: Diagnosis not present

## 2022-01-31 ENCOUNTER — Telehealth: Payer: Self-pay

## 2022-01-31 NOTE — Telephone Encounter (Signed)
PA renewal initiated automatically by CoverMyMeds. ? ?Submitted a Prior Authorization request to  [facility]  for [facility] via CoverMyMeds. Will update once we receive a response. ? ? ?Key: [id number] ?

## 2022-01-31 NOTE — Telephone Encounter (Signed)
Received fax stating that additional peer-to-peer review would be needed before the end of this business day. Contacted number provided and left message initiating the process. Will await return phone call. ?

## 2022-02-01 NOTE — Telephone Encounter (Addendum)
Received notification from [facility] regarding a prior authorization for [facility]. Authorization has been APPROVED from 01/31/2022 to 01/31/2023. Approval letter sent to scan center. ? ?Authorization #  [id number] ?

## 2022-02-01 NOTE — Telephone Encounter (Signed)
Called [employer] for update on patient's Xolair appeal. Per rep, case is re-opened for reconsideration with new case #. Marked as urgent - turnaround time is 24 hours. ? ?Will fax in clinical information ? ?Ref # [id number] ?Fax: [phone] ? ?Also called requesting peer to peer for Xolair ? ?Knox Saliva, PharmD, MPH, BCPS ?Clinical Pharmacist (Rheumatology and Pulmonology) ?

## 2022-02-13 NOTE — Telephone Encounter (Signed)
Received denial for patient's Xolair. Faxed URGENT appeal with clinicals to appeals department at [facility]. Expected turnaround time should be 72 hours ? ?Fax: [phone] ?Ref # [id number] ? ?Knox Saliva, PharmD, MPH, BCPS ?Clinical Pharmacist (Rheumatology and Pulmonology) ?

## 2022-02-14 ENCOUNTER — Other Ambulatory Visit (HOSPITAL_COMMUNITY): Payer: Self-pay

## 2022-02-14 ENCOUNTER — Telehealth: Payer: Self-pay | Admitting: Pharmacist

## 2022-02-14 NOTE — Telephone Encounter (Signed)
Patient provided with Xolair sample while we await Xolair appeal processing. ?2 x 179m/ml syringe and 1 x 768m0.5mL syringe  ? ?He is due for Xolair dose next Wednesday, 02/21/22 ? ?DeKnox SalivaPharmD, MPH, BCPS ?Clinical Pharmacist (Rheumatology and Pulmonology) ?

## 2022-02-20 ENCOUNTER — Other Ambulatory Visit (HOSPITAL_COMMUNITY): Payer: Self-pay

## 2022-02-21 ENCOUNTER — Other Ambulatory Visit: Payer: Self-pay | Admitting: Internal Medicine

## 2022-02-22 ENCOUNTER — Other Ambulatory Visit (HOSPITAL_COMMUNITY): Payer: Self-pay

## 2022-02-22 DIAGNOSIS — D508 Other iron deficiency anemias: Secondary | ICD-10-CM | POA: Diagnosis not present

## 2022-02-22 DIAGNOSIS — J479 Bronchiectasis, uncomplicated: Secondary | ICD-10-CM | POA: Diagnosis not present

## 2022-02-27 ENCOUNTER — Other Ambulatory Visit: Payer: Self-pay | Admitting: Internal Medicine

## 2022-02-27 ENCOUNTER — Other Ambulatory Visit (HOSPITAL_COMMUNITY): Payer: Self-pay

## 2022-02-27 DIAGNOSIS — A312 Disseminated mycobacterium avium-intracellulare complex (DMAC): Secondary | ICD-10-CM | POA: Diagnosis not present

## 2022-02-27 DIAGNOSIS — B4481 Allergic bronchopulmonary aspergillosis: Secondary | ICD-10-CM | POA: Diagnosis not present

## 2022-02-27 DIAGNOSIS — J4550 Severe persistent asthma, uncomplicated: Secondary | ICD-10-CM

## 2022-02-27 DIAGNOSIS — R0902 Hypoxemia: Secondary | ICD-10-CM | POA: Diagnosis not present

## 2022-02-27 DIAGNOSIS — B449 Aspergillosis, unspecified: Secondary | ICD-10-CM | POA: Diagnosis not present

## 2022-02-27 MED ORDER — OMALIZUMAB 75 MG/0.5ML ~~LOC~~ SOSY
PREFILLED_SYRINGE | SUBCUTANEOUS | 1 refills | Status: DC
  Filled 2022-02-27: qty 1, 28d supply, fill #0
  Filled 2022-02-27: qty 1, fill #0
  Filled 2022-03-21: qty 1, 28d supply, fill #1

## 2022-02-27 MED ORDER — OMALIZUMAB 150 MG/ML ~~LOC~~ SOSY
PREFILLED_SYRINGE | SUBCUTANEOUS | 1 refills | Status: DC
  Filled 2022-02-27 (×2): qty 4, 28d supply, fill #0
  Filled 2022-03-21: qty 4, 28d supply, fill #1

## 2022-02-27 NOTE — Telephone Encounter (Signed)
Called [employer] for update on patient's Xolair appeal. Per rep appeal is still pending. Appeal was changed from urgent to standard review. Due date on appeal is now 03/15/22 meaning that is when reviewer should look at case. Determination would ideally be receiving within 72 hours but no guarantee per rep ? ?Phone: [phone] ? ?I called patient to advise that he will need to stop by to pick up sample. He is due for next dose on 03/07/22. He will stop by for sample on 02/28/22 ? ?Knox Saliva, PharmD, MPH, BCPS ?Clinical Pharmacist (Rheumatology and Pulmonology) ?

## 2022-02-27 NOTE — Telephone Encounter (Signed)
Refill sent to [facility] for Xolair ? ?Dose: 317m SQ every 14 days --> 1516mx 2 pfs in addition to 7528m 1 pfs per dose ? ?DevKnox SalivaharmD, MPH, BCPS ?Clinical Pharmacist (Rheumatology and Pulmonology) ?

## 2022-02-28 ENCOUNTER — Other Ambulatory Visit (HOSPITAL_COMMUNITY): Payer: Self-pay

## 2022-02-28 NOTE — Telephone Encounter (Signed)
Pt provided with 28-days worth of sample medication. ?

## 2022-03-01 ENCOUNTER — Other Ambulatory Visit (HOSPITAL_COMMUNITY): Payer: Self-pay

## 2022-03-02 ENCOUNTER — Other Ambulatory Visit (HOSPITAL_COMMUNITY): Payer: Self-pay

## 2022-03-08 ENCOUNTER — Other Ambulatory Visit: Payer: Self-pay

## 2022-03-08 ENCOUNTER — Inpatient Hospital Stay (HOSPITAL_COMMUNITY)
Admission: EM | Admit: 2022-03-08 | Discharge: 2022-03-15 | DRG: 208 | Disposition: A | Discharge status: Home / Self Care | Payer: BC Managed Care – PPO | Attending: Family Medicine | Admitting: Family Medicine

## 2022-03-08 ENCOUNTER — Emergency Department (HOSPITAL_COMMUNITY): Payer: BC Managed Care – PPO

## 2022-03-08 ENCOUNTER — Observation Stay (HOSPITAL_COMMUNITY): Payer: BC Managed Care – PPO

## 2022-03-08 DIAGNOSIS — J15211 Pneumonia due to Methicillin susceptible Staphylococcus aureus: Secondary | ICD-10-CM | POA: Diagnosis not present

## 2022-03-08 DIAGNOSIS — R0602 Shortness of breath: Secondary | ICD-10-CM | POA: Diagnosis not present

## 2022-03-08 DIAGNOSIS — Z7951 Long term (current) use of inhaled steroids: Secondary | ICD-10-CM

## 2022-03-08 DIAGNOSIS — Z7952 Long term (current) use of systemic steroids: Secondary | ICD-10-CM

## 2022-03-08 DIAGNOSIS — G928 Other toxic encephalopathy: Secondary | ICD-10-CM | POA: Diagnosis not present

## 2022-03-08 DIAGNOSIS — I2782 Chronic pulmonary embolism: Secondary | ICD-10-CM | POA: Diagnosis present

## 2022-03-08 DIAGNOSIS — B449 Aspergillosis, unspecified: Secondary | ICD-10-CM | POA: Diagnosis present

## 2022-03-08 DIAGNOSIS — R7881 Bacteremia: Secondary | ICD-10-CM | POA: Diagnosis not present

## 2022-03-08 DIAGNOSIS — Z902 Acquired absence of lung [part of]: Secondary | ICD-10-CM

## 2022-03-08 DIAGNOSIS — J189 Pneumonia, unspecified organism: Secondary | ICD-10-CM

## 2022-03-08 DIAGNOSIS — Z4659 Encounter for fitting and adjustment of other gastrointestinal appliance and device: Secondary | ICD-10-CM | POA: Diagnosis not present

## 2022-03-08 DIAGNOSIS — I272 Pulmonary hypertension, unspecified: Secondary | ICD-10-CM | POA: Diagnosis not present

## 2022-03-08 DIAGNOSIS — R778 Other specified abnormalities of plasma proteins: Secondary | ICD-10-CM | POA: Diagnosis present

## 2022-03-08 DIAGNOSIS — R64 Cachexia: Secondary | ICD-10-CM | POA: Diagnosis present

## 2022-03-08 DIAGNOSIS — I471 Supraventricular tachycardia: Secondary | ICD-10-CM | POA: Diagnosis present

## 2022-03-08 DIAGNOSIS — G9340 Encephalopathy, unspecified: Secondary | ICD-10-CM | POA: Diagnosis present

## 2022-03-08 DIAGNOSIS — E039 Hypothyroidism, unspecified: Secondary | ICD-10-CM | POA: Diagnosis not present

## 2022-03-08 DIAGNOSIS — J9611 Chronic respiratory failure with hypoxia: Secondary | ICD-10-CM | POA: Diagnosis present

## 2022-03-08 DIAGNOSIS — B4481 Allergic bronchopulmonary aspergillosis: Secondary | ICD-10-CM | POA: Diagnosis present

## 2022-03-08 DIAGNOSIS — J471 Bronchiectasis with (acute) exacerbation: Secondary | ICD-10-CM

## 2022-03-08 DIAGNOSIS — Z79899 Other long term (current) drug therapy: Secondary | ICD-10-CM

## 2022-03-08 DIAGNOSIS — R042 Hemoptysis: Secondary | ICD-10-CM | POA: Diagnosis not present

## 2022-03-08 DIAGNOSIS — M810 Age-related osteoporosis without current pathological fracture: Secondary | ICD-10-CM | POA: Diagnosis present

## 2022-03-08 DIAGNOSIS — B440 Invasive pulmonary aspergillosis: Secondary | ICD-10-CM | POA: Diagnosis present

## 2022-03-08 DIAGNOSIS — B181 Chronic viral hepatitis B without delta-agent: Secondary | ICD-10-CM | POA: Diagnosis present

## 2022-03-08 DIAGNOSIS — I361 Nonrheumatic tricuspid (valve) insufficiency: Secondary | ICD-10-CM | POA: Diagnosis not present

## 2022-03-08 DIAGNOSIS — I77810 Thoracic aortic ectasia: Secondary | ICD-10-CM | POA: Diagnosis not present

## 2022-03-08 DIAGNOSIS — R918 Other nonspecific abnormal finding of lung field: Secondary | ICD-10-CM | POA: Diagnosis not present

## 2022-03-08 DIAGNOSIS — J470 Bronchiectasis with acute lower respiratory infection: Secondary | ICD-10-CM | POA: Diagnosis present

## 2022-03-08 DIAGNOSIS — R739 Hyperglycemia, unspecified: Secondary | ICD-10-CM | POA: Diagnosis present

## 2022-03-08 DIAGNOSIS — Z880 Allergy status to penicillin: Secondary | ICD-10-CM

## 2022-03-08 DIAGNOSIS — I517 Cardiomegaly: Secondary | ICD-10-CM | POA: Diagnosis not present

## 2022-03-08 DIAGNOSIS — J439 Emphysema, unspecified: Secondary | ICD-10-CM | POA: Diagnosis not present

## 2022-03-08 DIAGNOSIS — Z681 Body mass index (BMI) 19 or less, adult: Secondary | ICD-10-CM

## 2022-03-08 DIAGNOSIS — I2723 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s and hypoxia: Secondary | ICD-10-CM | POA: Diagnosis present

## 2022-03-08 DIAGNOSIS — J430 Unilateral pulmonary emphysema [MacLeod's syndrome]: Secondary | ICD-10-CM | POA: Diagnosis present

## 2022-03-08 DIAGNOSIS — J479 Bronchiectasis, uncomplicated: Secondary | ICD-10-CM

## 2022-03-08 DIAGNOSIS — J4550 Severe persistent asthma, uncomplicated: Secondary | ICD-10-CM | POA: Diagnosis present

## 2022-03-08 DIAGNOSIS — K219 Gastro-esophageal reflux disease without esophagitis: Secondary | ICD-10-CM | POA: Diagnosis present

## 2022-03-08 DIAGNOSIS — A310 Pulmonary mycobacterial infection: Secondary | ICD-10-CM | POA: Diagnosis not present

## 2022-03-08 DIAGNOSIS — B441 Other pulmonary aspergillosis: Secondary | ICD-10-CM | POA: Diagnosis not present

## 2022-03-08 DIAGNOSIS — J9601 Acute respiratory failure with hypoxia: Secondary | ICD-10-CM | POA: Diagnosis not present

## 2022-03-08 DIAGNOSIS — J9621 Acute and chronic respiratory failure with hypoxia: Secondary | ICD-10-CM | POA: Diagnosis not present

## 2022-03-08 DIAGNOSIS — J9600 Acute respiratory failure, unspecified whether with hypoxia or hypercapnia: Secondary | ICD-10-CM

## 2022-03-08 DIAGNOSIS — Z79890 Hormone replacement therapy (postmenopausal): Secondary | ICD-10-CM

## 2022-03-08 DIAGNOSIS — R636 Underweight: Secondary | ICD-10-CM | POA: Diagnosis present

## 2022-03-08 DIAGNOSIS — Z888 Allergy status to other drugs, medicaments and biological substances status: Secondary | ICD-10-CM | POA: Diagnosis not present

## 2022-03-08 DIAGNOSIS — E43 Unspecified severe protein-calorie malnutrition: Secondary | ICD-10-CM | POA: Diagnosis not present

## 2022-03-08 DIAGNOSIS — R634 Abnormal weight loss: Secondary | ICD-10-CM | POA: Diagnosis present

## 2022-03-08 DIAGNOSIS — Z4682 Encounter for fitting and adjustment of non-vascular catheter: Secondary | ICD-10-CM | POA: Diagnosis not present

## 2022-03-08 LAB — CBC WITH DIFFERENTIAL/PLATELET
Abs Immature Granulocytes: 0.06 10*3/uL (ref 0.00–0.07)
Basophils Absolute: 0 10*3/uL (ref 0.0–0.1)
Basophils Relative: 0 %
Eosinophils Absolute: 0 10*3/uL (ref 0.0–0.5)
Eosinophils Relative: 0 %
HCT: 39.3 % (ref 39.0–52.0)
Hemoglobin: 13.1 g/dL (ref 13.0–17.0)
Immature Granulocytes: 0 %
Lymphocytes Relative: 2 %
Lymphs Abs: 0.3 10*3/uL — ABNORMAL LOW (ref 0.7–4.0)
MCH: 32.3 pg (ref 26.0–34.0)
MCHC: 33.3 g/dL (ref 30.0–36.0)
MCV: 97 fL (ref 80.0–100.0)
Monocytes Absolute: 0.2 10*3/uL (ref 0.1–1.0)
Monocytes Relative: 1 %
Neutro Abs: 13.9 10*3/uL — ABNORMAL HIGH (ref 1.7–7.7)
Neutrophils Relative %: 97 %
Platelets: 247 10*3/uL (ref 150–400)
RBC: 4.05 MIL/uL — ABNORMAL LOW (ref 4.22–5.81)
RDW: 12.9 % (ref 11.5–15.5)
WBC: 14.5 10*3/uL — ABNORMAL HIGH (ref 4.0–10.5)
nRBC: 0 % (ref 0.0–0.2)

## 2022-03-08 LAB — COMPREHENSIVE METABOLIC PANEL
ALT: 13 U/L (ref 0–44)
AST: 25 U/L (ref 15–41)
Albumin: 3.6 g/dL (ref 3.5–5.0)
Alkaline Phosphatase: 87 U/L (ref 38–126)
Anion gap: 10 (ref 5–15)
BUN: 16 mg/dL (ref 6–20)
CO2: 21 mmol/L — ABNORMAL LOW (ref 22–32)
Calcium: 8.6 mg/dL — ABNORMAL LOW (ref 8.9–10.3)
Chloride: 105 mmol/L (ref 98–111)
Creatinine, Ser: 1.02 mg/dL (ref 0.61–1.24)
GFR, Estimated: >60 mL/min (ref >60)
Glucose, Bld: 97 mg/dL (ref 70–99)
Potassium: 4.7 mmol/L (ref 3.5–5.1)
Sodium: 136 mmol/L (ref 135–145)
Total Bilirubin: 1 mg/dL (ref 0.3–1.2)
Total Protein: 7 g/dL (ref 6.5–8.1)

## 2022-03-08 LAB — PROTIME-INR
INR: 1 (ref 0.8–1.2)
Prothrombin Time: 13.1 seconds (ref 11.4–15.2)

## 2022-03-08 LAB — D-DIMER, QUANTITATIVE: D-Dimer, Quant: 0.52 ug/mL-FEU — ABNORMAL HIGH (ref 0.00–0.50)

## 2022-03-08 LAB — TROPONIN I (HIGH SENSITIVITY)
Troponin I (High Sensitivity): 30 ng/L — ABNORMAL HIGH (ref <18)
Troponin I (High Sensitivity): 28 ng/L — ABNORMAL HIGH (ref <18)

## 2022-03-08 LAB — BRAIN NATRIURETIC PEPTIDE: B Natriuretic Peptide: 73 pg/mL (ref 0.0–100.0)

## 2022-03-08 LAB — HIV ANTIBODY (ROUTINE TESTING W REFLEX): HIV Screen 4th Generation wRfx: NONREACTIVE

## 2022-03-08 MED ORDER — SODIUM CHLORIDE 0.9 % IV SOLN: INTRAVENOUS | Status: DC

## 2022-03-08 MED ORDER — ETHAMBUTOL HCL 400 MG PO TABS
1000.0000 mg | ORAL_TABLET | Freq: Every morning | ORAL | Status: DC
  Administered 2022-03-09 – 2022-03-10 (×2): 1000 mg via ORAL
  Filled 2022-03-08 (×2): qty 2

## 2022-03-08 MED ORDER — LEVOTHYROXINE SODIUM 100 MCG PO TABS
100.0000 ug | ORAL_TABLET | Freq: Every day | ORAL | Status: DC
  Administered 2022-03-09 – 2022-03-10 (×2): 100 ug via ORAL
  Filled 2022-03-08 (×2): qty 1

## 2022-03-08 MED ORDER — MIRTAZAPINE 30 MG PO TBDP
45.0000 mg | ORAL_TABLET | Freq: Every day | ORAL | Status: DC
  Administered 2022-03-08 – 2022-03-09 (×2): 45 mg via ORAL
  Filled 2022-03-08 (×4): qty 1

## 2022-03-08 MED ORDER — AZITHROMYCIN 500 MG PO TABS
500.0000 mg | ORAL_TABLET | Freq: Every day | ORAL | Status: DC
  Administered 2022-03-09 – 2022-03-10 (×2): 500 mg via ORAL
  Filled 2022-03-08 (×2): qty 1

## 2022-03-08 MED ORDER — ISAVUCONAZONIUM SULFATE 186 MG PO CAPS
2.0000 | ORAL_CAPSULE | Freq: Every evening | ORAL | Status: DC
  Filled 2022-03-08 (×2): qty 2

## 2022-03-08 MED ORDER — PREDNISONE 10 MG PO TABS
10.0000 mg | ORAL_TABLET | Freq: Every day | ORAL | Status: DC
  Administered 2022-03-09 – 2022-03-10 (×2): 10 mg via ORAL
  Filled 2022-03-08: qty 1
  Filled 2022-03-08: qty 2

## 2022-03-08 MED ORDER — HYDROCOD POLI-CHLORPHE POLI ER 10-8 MG/5ML PO SUER
5.0000 mL | Freq: Three times a day (TID) | ORAL | Status: DC
  Administered 2022-03-08 – 2022-03-09 (×3): 5 mL via ORAL
  Filled 2022-03-08 (×3): qty 5

## 2022-03-08 MED ORDER — SODIUM CHLORIDE 0.9 % IV SOLN
500.0000 mg | Freq: Once | INTRAVENOUS | Status: AC
  Administered 2022-03-08: 500 mg via INTRAVENOUS
  Filled 2022-03-08: qty 5

## 2022-03-08 MED ORDER — ISAVUCONAZONIUM SULFATE 186 MG PO CAPS
372.0000 mg | ORAL_CAPSULE | Freq: Every evening | ORAL | Status: DC
  Administered 2022-03-08 – 2022-03-10 (×3): 372 mg via ORAL
  Filled 2022-03-08 (×4): qty 2

## 2022-03-08 MED ORDER — ARFORMOTEROL TARTRATE 15 MCG/2ML IN NEBU
15.0000 ug | INHALATION_SOLUTION | Freq: Two times a day (BID) | RESPIRATORY_TRACT | Status: DC
  Administered 2022-03-08 – 2022-03-15 (×14): 15 ug via RESPIRATORY_TRACT
  Filled 2022-03-08 (×19): qty 2

## 2022-03-08 MED ORDER — IPRATROPIUM-ALBUTEROL 0.5-2.5 (3) MG/3ML IN SOLN
3.0000 mL | RESPIRATORY_TRACT | Status: DC | PRN
  Administered 2022-03-08 – 2022-03-10 (×2): 3 mL via RESPIRATORY_TRACT
  Filled 2022-03-08 (×2): qty 3

## 2022-03-08 MED ORDER — BUDESONIDE 0.5 MG/2ML IN SUSP
0.5000 mg | Freq: Two times a day (BID) | RESPIRATORY_TRACT | Status: DC
Start: 2022-03-08 — End: 2022-03-11
  Administered 2022-03-08 – 2022-03-11 (×6): 0.5 mg via RESPIRATORY_TRACT
  Filled 2022-03-08 (×9): qty 2

## 2022-03-08 MED ORDER — SODIUM CHLORIDE 0.9 % IV SOLN
2.0000 g | INTRAVENOUS | Status: DC
  Administered 2022-03-09: 2 g via INTRAVENOUS
  Filled 2022-03-08: qty 20

## 2022-03-08 MED ORDER — SODIUM CHLORIDE 0.9 % IV SOLN
1.0000 g | Freq: Once | INTRAVENOUS | Status: AC
  Administered 2022-03-08: 1 g via INTRAVENOUS
  Filled 2022-03-08: qty 10

## 2022-03-08 MED ORDER — IOHEXOL 350 MG/ML SOLN
100.0000 mL | Freq: Once | INTRAVENOUS | Status: AC | PRN
Start: 2022-03-08 — End: 2022-03-08
  Administered 2022-03-08: 60 mL via INTRAVENOUS

## 2022-03-08 NOTE — Progress Notes (Signed)
FPTS Brief Progress Note ? ?S: Patient reports that he feels better with less coughing than this AM.  ? ? ?O: ?BP 112/70 (BP Location: Right Arm)   Pulse 73   Temp 97.7 ?F (36.5 ?C) (Oral)   Resp 18   Ht 5' 8" (1.727 m)   Wt 56.7 kg   SpO2 92%   BMI 19.01 kg/m?   ?Gen: NAD, nontoxic, pleasant, frail  ?Resp: Diffuse diminished lung sounds with normal WOB, on RA  ? ?A/P: ?Patient given regular diet until MN, may need bronch tomorrow with pulm. Continue abx per day team. ID and pulmonology recommendations appreciated.  ?- Orders reviewed. Labs for AM ordered, which was adjusted as needed.  ? ?Erskine Emery, MD ?03/08/2022, 9:48 PM ?PGY-1, [place] [facility] Night Resident  ?Please page [phone] with questions.  ? ? ?

## 2022-03-08 NOTE — ED Provider Notes (Signed)
?[facility] ?Provider Note ? ? ?CSN: [id number] ?Arrival date & time: 03/08/22  1144 ? ?  ? ?History ? ?Chief Complaint  ?Patient presents with  ? Shortness of Breath  ? ? ?[name] is a 46 y.o. male.  Presents emergency room with concern for shortness of breath.  Patient reports that he started coughing up blood around 1015 this morning, had increased shortness of breath associated with this.  States that otherwise he was not feeling bad earlier.  No chills or fevers.  Yesterday he did not have any new symptoms.  States that he occasionally has small specks of blood in his sputum but never this amount of blood. ? ?Feels somewhat short of breath right now denies any chest pain. ? ?Has history of aspergilloma.  Followed by infectious disease and pulmonary. ? ?HPI ? ?  ? ?Home Medications ?Prior to Admission medications   ?Medication Sig Start Date End Date Taking? Authorizing Provider  ?acetaminophen (TYLENOL) 500 MG tablet Take 1,000-1,500 mg by mouth every 6 (six) hours as needed for headache (pain).   Yes [provider]  ?albuterol (PROVENTIL) (2.5 MG/3ML) 0.083% nebulizer solution TAKE 3 ML BY NEBULIZATION EVERY 6 HOURS AS NEEDED FOR WHEEZING OR SHORTNESS OF BREATH ?Patient taking differently: Take 2.5 mg by nebulization every 6 (six) hours. 11/03/21  Yes Brand Males, MD  ?albuterol (VENTOLIN HFA) 108 (90 Base) MCG/ACT inhaler Inhale 2 puffs into the lungs every 6 (six) hours as needed. ?Patient taking differently: Inhale 2 puffs into the lungs every 6 (six) hours as needed for wheezing or shortness of breath. 09/15/20  Yes Brand Males, MD  ?azithromycin (ZITHROMAX) 500 MG tablet Take 1 tablet (500 mg total) by mouth daily. ?Patient taking differently: Take 500 mg by mouth every morning. 10/04/21  Yes Tommy Medal, Lavell Islam, MD  ?Benralizumab [facility] PEN) 30 MG/ML SOAJ Inject 1 mL (30 mg total) into the skin every 8 (eight) weeks. 12/29/21  Yes  Brand Males, MD  ?Judithann Sauger AEROSPHERE 160-9-4.8 MCG/ACT AERO INHALE 2 PUFFS BY MOUTH TWICE DAILY ?Patient taking differently: Inhale 2 puffs into the lungs 2 (two) times daily. 02/21/22  Yes Brand Males, MD  ?CALCIUM CARBONATE-VITAMIN D PO Take 2 tablets by mouth every morning.   Yes [provider]  ?Dextromethorphan-guaiFENesin (MUCINEX DM MAXIMUM STRENGTH) 60-1200 MG TB12 Take 1 tablet by mouth 2 (two) times daily.   Yes [provider]  ?EPINEPHrine 0.3 mg/0.3 mL IJ SOAJ injection Inject 0.3 mg into the muscle once for 1 dose. ?Patient taking differently: Inject 0.3 mg into the muscle once as needed for anaphylaxis. 11/23/20 03/08/22 Yes Brand Males, MD  ?ethambutol (MYAMBUTOL) 400 MG tablet TAKE 2 AND 1/2 TABLETS BY MOUTH ONCE DAILY ?Patient taking differently: Take 1,000 mg by mouth every morning. 10/04/21  Yes Truman Hayward, MD  ?feeding supplement, ENSURE ENLIVE, (ENSURE ENLIVE) LIQD Take 237 mLs by mouth 3 (three) times daily between meals. ?Patient taking differently: Take 237 mLs by mouth 2 (two) times daily as needed (meal supplement). 10/06/18  Yes Lavina Hamman, MD  ?ferrous sulfate 325 (65 FE) MG tablet Take 325 mg by mouth every morning.   Yes [provider]  ?folic acid (FOLVITE) 1 MG tablet Take 1 mg by mouth every other day. 11/11/20  Yes [provider]  ?ibuprofen (ADVIL) 200 MG tablet Take 400-600 mg by mouth every 6 (six) hours as needed for headache (pain).   Yes [provider]  ?  Isavuconazonium Sulfate (CRESEMBA) 186 MG CAPS TAKE 2 CAPSULES BY MOUTH DAILY ?Patient taking differently: Take 2 capsules by mouth every evening. After work 10/04/21 10/04/22 Yes Truman Hayward, MD  ?levothyroxine (SYNTHROID) 100 MCG tablet Take 100 mcg by mouth daily before breakfast. 01/19/21  Yes [provider]  ?lidocaine (LIDODERM) 5 % Place 1 patch onto the skin daily as needed (pain). Remove & Discard patch within 12 hours or as  directed by MD 02/21/21  Yes Parrett, Fonnie Mu, NP  ?Menthol-Methyl Salicylate (SALONPAS PAIN RELIEF PATCH EX) Place 1-2 patches onto the skin daily. Back pain   Yes [provider]  ?mirtazapine (REMERON SOL-TAB) 45 MG disintegrating tablet Take 45 mg by mouth at bedtime. 11/02/21  Yes [provider]  ?omalizumab Arvid Right) 150 MG/ML prefilled syringe INJECT 300 MG INTO THE SKIN EVERY 14 (FOURTEEN) DAYS. IN ADDITION TO 75MG (TOTAL DOSE IS 375MG EVERY 14 DAYS). ?Patient taking differently: 300 mg every 14 (fourteen) days. Take with a 75 mg injection for a total dose of 375 mg 02/27/22 02/27/23 Yes Ramaswamy, Belva Crome, MD  ?omalizumab Arvid Right) 75 MG/0.5ML prefilled syringe INJECT 75 MG INTO THE SKIN EVERY 14 (FOURTEEN) DAYS. IN ADDITION TO 300MG (TOTAL DOSE IS 375MG EVERY 14 DAYS). DELIVER TO PATIENT'S HOME. ?Patient taking differently: 75 mg every 14 (fourteen) days. Take with two 150 mg injections for a total dose of 375 mg 02/27/22 02/27/23 Yes Brand Males, MD  ?OXYGEN Inhale 3 L into the lungs as needed (shortness of breath).   Yes [provider]  ?predniSONE (DELTASONE) 10 MG tablet Take 1 tablet (10 mg total) by mouth daily with breakfast. 02/21/21  Yes Parrett, Fonnie Mu, NP  ?Pseudoephedrine-APAP-DM (DAYQUIL PO) Take 2 capsules by mouth 3 (three) times daily as needed (cough/temperature issues).   Yes [provider]  ?sodium chloride HYPERTONIC 3 % nebulizer solution Take by nebulization daily. Dx: J47.9 ?Patient taking differently: Take 4 mLs by nebulization daily. Dx: J47.9 05/11/21  Yes Brand Males, MD  ?   ? ?Allergies    ?Clarithromycin and Rifaximin   ? ?Review of Systems   ?Review of Systems  ?Constitutional:  Negative for chills and fever.  ?HENT:  Negative for ear pain and sore throat.   ?Eyes:  Negative for pain and visual disturbance.  ?Respiratory:  Positive for cough and shortness of breath.   ?Cardiovascular:  Negative for chest pain and palpitations.   ?Gastrointestinal:  Negative for abdominal pain and vomiting.  ?Genitourinary:  Negative for dysuria and hematuria.  ?Musculoskeletal:  Negative for arthralgias and back pain.  ?Skin:  Negative for color change and rash.  ?Neurological:  Negative for seizures and syncope.  ?All other systems reviewed and are negative. ? ?Physical Exam ?Updated Vital Signs ?BP 110/84   Pulse 66   Temp 99.3 ?F (37.4 ?C) (Oral)   Resp 16   Ht _0  (1.727 m)   Wt 56.7 kg   SpO2 100%   BMI 19.01 kg/m?  ?Physical Exam ?Vitals and nursing note reviewed.  ?Constitutional:   ?   General: He is not in acute distress. ?   Appearance: He is well-developed.  ?HENT:  ?   Head: Normocephalic and atraumatic.  ?Eyes:  ?   Conjunctiva/sclera: Conjunctivae normal.  ?Cardiovascular:  ?   Rate and Rhythm: Normal rate and regular rhythm.  ?   Heart sounds: No murmur heard. ?Pulmonary:  ?   Effort: Pulmonary effort is normal. No respiratory distress.  ?  Breath sounds: Normal breath sounds.  ?Abdominal:  ?   Palpations: Abdomen is soft.  ?   Tenderness: There is no abdominal tenderness.  ?Musculoskeletal:     ?   General: No swelling.  ?   Cervical back: Neck supple.  ?Skin: ?   General: Skin is warm and dry.  ?   Capillary Refill: Capillary refill takes less than 2 seconds.  ?Neurological:  ?   Mental Status: He is alert.  ?Psychiatric:     ?   Mood and Affect: Mood normal.  ? ? ?ED Results / Procedures / Treatments   ?Labs ?(all labs ordered are listed, but only abnormal results are displayed) ?Labs Reviewed  ?CBC WITH DIFFERENTIAL/PLATELET - Abnormal; Notable for the following components:  ?    Result Value  ? WBC 14.5 (*)   ? RBC 4.05 (*)   ? Neutro Abs 13.9 (*)   ? Lymphs Abs 0.3 (*)   ? All other components within normal limits  ?PROTIME-INR  ?COMPREHENSIVE METABOLIC PANEL  ?BRAIN NATRIURETIC PEPTIDE  ?D-DIMER, QUANTITATIVE  ?TROPONIN I (HIGH SENSITIVITY)  ?TROPONIN I (HIGH SENSITIVITY)  ? ? ?EKG ?EKG Interpretation ? ?Date/Time:  Thursday  March 08 2022 13:04:53 EDT ?Ventricular Rate:  64 ?PR Interval:  142 ?QRS Duration: 95 ?QT Interval:  431 ?QTC Calculation: 445 ?R Axis:   39 ?Text Interpretation: Sinus rhythm Atrial premature complex Probable anterosepta

## 2022-03-08 NOTE — Hospital Course (Addendum)
Brief Hospital Course ?[name] is a 46yo male who presented to the [facility] on 4/20 with hemoptysis. His history is significant for Mycobacterium avium infection, allergic bronchopulmonary aspergillosis with high IgE, history of aspergilloma s/p LUL lobectomy, bullous emphysema, and history of massive hemoptysis with IR embolization in 2016. ? ?Hemoptysis  MSSA Pneumonia ?Patient presented to the [facility] ED on 4/20 with the sudden onset of hemoptysis at home.  He was initially placed on 5 L by EMS but was weaned quickly to room air.  On arrival, his hemoglobin was reassuringly within normal limits at 13.1.  He had a slight leukocytosis to 14.5 with a neutrophilic predominance.  Chest x-ray showed bilateral upper lobe airspace opacities right greater than sign left, CTA raise concern for a masslike area extending from within the posterior aspect of the right upper lobe into the posterior aspect of the right lower lobe.  The CT read is concerned that this may represent a large area of atelectasis versus an underlying neoplastic process.  The patient was maintained on ceftriaxone and azithromycin for CAP coverage.  Patient acutely decompensated on 03/09/2022 possibly secondary to aspiration after an episode of hemoptysis.  He exhibited acute encephalopathic changes and need for nonrebreather as well as new tachycardia.  PCCM was consulted and decided to move him to the ICU from 4/21 to 4/25.  He underwent to bronchoscopies which showed blood in the airways but but no source of active bleeding. BAL showed MSSA.  Patient self extubated on 4/23 and remained hemodynamically stable thereafter. Patient was transitioned to Keflex for MSSA coverage. He was transferred out of the ICU into the family medicine teaching service on 4/25 requiring 4L [place]. He weaned to room air on 4/26 and was discharged with 2L O2 to use as needed with activity until he fully recovers. He will complete his course of Keflex for MSSA pneumonia on 4/28.   ? ?MAC  ABPA with High IgE  Hx Aspergilloma s/p LUL Lobectomy ?Patient was maintained on his home medications of azithromycin, ethambutol, Cresemba and these chronic conditions were not thought to be contributing to his presentation.  ? ?Paroxysmal SVT ?Patient developed paroxysmal SVT on 4/26. He had three episodes  nonsustained episodes (~15 seconds) overnight that were symptomatic with palpitations. Per patient report, these occurred in the context of coughing spells. Echocardiogram was obtained which showed LVEF 50-55%, mild LVH. Mildly reduced RH systolic function but normal pulmonary artery systolic pressure. We suspect that symptoms were secondary to acute illness and addition of LABA (Brovana) to his hospital medication regimen.  ? ?Protein Calorie Malnutrition, severe ?Patient noted to be very thin on arrival. Registered dietician was consulted with recommendations for PO supplementation and MVI. ? ?Items for PCP Follow-up ?1) Per last pulm note, patient needed to get BMI >19 to qualify for possible lung transplant, please work with him on strategies for weight gain as he would greatly benefit from possible lung transplant in the future. ?2) Patient developed SVT during hospitalization as discussed above. ?

## 2022-03-08 NOTE — ED Triage Notes (Signed)
Pt BIB [facility] from home after calling for [facility]. Patient reports SOB and coughing up blood starting about 10:15am. Patient has hx of pulmonary aspergillus, usually controlled with meds. Currently 99% on  5L.  ?

## 2022-03-08 NOTE — ED Notes (Signed)
Patient transported to CT ?

## 2022-03-08 NOTE — H&P (Signed)
Family Medicine Teaching Service ?Hospital Admission History and Physical ?Service Pager: [phone] ? ?Patient name: [name] Medical record number: [id number] ?Date of birth: 02/26/1976 Age: 46 y.o. Gender: Farmer ? ?Primary Care Provider: Robert Bellow, PA-C ?Consultants: Pulmonology ?Code Status: Full ?Preferred Emergency Contact: Mother [name]: [phone]  ? ?Chief Complaint: Hemoptysis ? ?Assessment and Plan: ?[name] is a 46 y.o. Farmer presenting with hemoptysis . PMH is significant for Mycobacterium avium infection, allergic bronchopulmonary aspergillosis, history of aspergilloma s/p LUL lobectomy, bullous emphysema, hx of massive hemoptysis with IR embolization in 2016, hx of intrabronchial valve placement for pneumothorax in 2019, chronic pulmonary emboli-not on anticoagulation due to hemoptysis, and chronic hepatitis B.  ? ?Hemoptysis  Suspected CAP ?Patient presenting with sudden onset of hemoptysis at home starting this morning. Patient was initially placed on 5L [place] by EMS but has since weaned successfully to room air, vital signs otherwise stable. WBC elevated to 12.7 with neutrophilic predominance. Hgb reassuringly WNL at 13.1, INR wnl. Troponin 30>repeat pending. D-dimer mildly elevated to 0.52. CXR on admission with bilateral upper lobe airspace opacities R>L concerning for multifocal pneumonia. CTA with bullous emphysema and a 4.0x2.2x8.0cm mass-like area extending from within the posterior aspect of a large R upper lobe bullae into the posterior aspect of the RLL. Per CT read, this may represent a large area of atelectasis but underlying neoplastic process cannot be excluded.  ?Patient does have a history of life-threatening hemoptysis in 2016 requiring IR embolization, therefore will admit for close observation and symptomatic control of cough/hemoptysis.  ?- Admit to Med-Tele, Dr. Gwendlyn Farmer attending ?- Pulmonology following, appreciate recs ?- Likely to need bronchoscopy, will make NPO ?-  CTX and Azithromycin for CAP coverage ?- Tussionex to suppress cough given hemoptysis ?- Pulmicort nebs BID ?- Duonebs q4h PRN ?- Cardiac monitoring and pulse oximetry ?- Will check a strep pneumo and legionella urine antigen ? ?Elevated Troponin  Pulmonary Hypertension ?First hsTroponin 30. Suspect elevated troponin secondary to heart strain from Pulmonary hypertension. No chest pain. ECG with 11m ST elevation in V5, not concerning for STEMI. Dilated RV noted on CTA chest in 2021. Echo 08/21 with RVSP 479mg.  Follows with Dr. RaChase Callerpulmonology. No pulmonary hypertension-specific medications at this time. ?- Pulm following, appreciate recs ?- Follow-up repeat troponin ? ?MAC Infection ?Follows with Dr. VaTommy MedalID. Reports adherence to home Azithromycin and Ethambutol. Spoke with Dr. WaJuleen Farmer on call to make him aware of their patient being admitted. He reports that they would be happy to formally consult if needed in the future.  ?- Continue Azithromycin and Ethambutol ?- Low threshold to consult ID  ? ?ABPA with High IgE  History of Aspergilloma s/p LUL Lobectomy  Bullous Emphysema  ?On lifelong antifungal therapy with Cresemba--was previously on voriconazole but deverloped rash in sun-exposed area.  On prednisone 1065maily and Fasenra q8 weeks for ABPA. Bullous emphysema presumed secondary to IgE-mediated destruction of lung tissues.  Marked digital clubbing on exam. ?- Continue home Cresemba ? ?Hx Chronic Pulmonary Emboli ?Occlusion of RUL and LUL pulmonary arteries seen dating back to 05/2020, not anticoagulated given history of life-threatening hemoptysis requiring embolization. Reassuringly, no evidence of PE on CTA obtained today. Initially placed on 5L [place] by EMS but SORA at this time. ?- Continuous pulse ox ?- Cardiac monitoring ? ?Chronic Hepatitis B ?Follows with ID. LFTs WNL today. Hep B DNA 2370 IU/mL six months ago.  ?- Do not believe this is contributing to his presentation  at this  time ? ?Hypothyroidism ?- Continue home Synthroid 169mg daily ?- Repeat TSH ? ?Cachexia  ?Very thin. Reports difficulty gaining weight, denies any recent weight loss. ?- Consult to RD ?- Continue home mirtazapine 45mQHS ? ?FEN/GI: NPO ahead of possible bronch, NS infusion while NPO  ?Prophylaxis: SCDs, concern for bleed ? ?Disposition: Med-Tele ? ?History of Present Illness:  Brett [name] presenting with sudden onset hemoptysis this morning in the setting of several days of cough.  Given his history of life-threatening hemoptysis in 2016, he called EMS at the first signs of frank hemoptysis.  The cough was previously productive of whitish-yellowish sputum occasionally flecked with blood but he began having frank hemoptysis this morning.  Patient shows me an emesis bag with approximately 30-50 mL of blood that he has expectorated in just the last 1-2 hours.  He reports that he has otherwise been feeling like himself in recent days.  He thinks perhaps he has been working more and harder than usual but denies any fever, chills, shortness of breath beyond his baseline.  No sick contacts or recent international travel. ?He reports good adherence to all of his medications including his antifungal and antibiotics for his ABPA and MAC.  He reports that he has been able to eat and drink as usual and denies any recent weight loss.  He does note that he has a very hard time gaining weight and was told by the lung transplant physician that he would need to gain weight prior to being a candidate for a potential transplant.  He is on Remeron for appetite support in the evenings. ?He has a remote smoking history.  [place] MAC and As[place]illosis.  Works at [employer] No drugs or alcohol. ? ?Review Of Systems: Per HPI with the following additions:  ? ?Review of Systems  ?Constitutional:  Negative for appetite change, chills, fever and  unexpected weight change.  ?Respiratory:  Positive for cough.   ?Cardiovascular:  Negative for chest pain, palpitations and leg swelling.  ?Gastrointestinal:  Negative for blood in stool, constipation, diarrhea and vomiting.  ?Skin:  Positive for rash (related to antifungal).   ? ?Patient Active Problem List  ? Diagnosis Date Noted  ? Rib fracture 02/08/2021  ? Chronic respiratory failure with hypoxia (HCHumboldt River Ranch03/21/2022  ? Vaccine counseling 12/28/2020  ? Bronchiectasis (HCNorthwood01/24/2022  ? Chronic viral hepatitis B without delta-agent (HCPine [place]01/03/2021  ? Cough 09/09/2020  ? Protein-calorie malnutrition, severe 09/05/2018  ? Tension pneumothorax 09/04/2018  ? 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 ([facility]/17/2018  ? Drug rash 12/16/2017  ? Photosensitivity dermatitis 09/16/2017  ? GERD (gastroesophageal reflux disease) 08/09/2017  ? Hypothyroidism 08/09/2017  ? Iron deficiency anemia 08/09/2017  ? Weight loss 10/15/2016  ? Severe persistent asthma 01/16/2016  ? Mold exposure 12/05/2015  ? Invasive pulmonary aspergillosis (HCEnderlin12/05/2015  ? Hemoptysis   ? Mycobacterium avium-intracellulare complex (HCBallplay  ? Solar lentigo 08/08/2015  ? Dyspnea 05/24/2014  ? Osteoporosis, unspecified 03/10/2013  ? Osteoporosis 09/22/2012  ? Asthmatic bronchitis , chronic (HCScranton03/13/2013  ? Mycobacterium avium infection (HCBabb08/06/2011  ? 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HCRiley05/06/2011  ? ABPA (allergic bronchopulmonary aspergillosis) (HCLake Norden05/01/2011  ? Mycobacterium avium complex (HCHunter[place]02/07/2011  ? ? ?Past Medical History: ?Past Medical History:  ?Diagnosis Date  ? Anemia   ? 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HCHereford  ? Chronic viral hepatitis B without delta-agent (HCCedar Creek  11/23/2020  ? Drug rash 12/16/2017  ? Dyspnea   ? Esophageal reflux   ? very rare  ? Hypothyroidism   ? secondary to ablation for Graves disease  ? Lung disease, bullous ([place])   ? MAI (mycobacterium avium-intracellulare) ([place])   ? Mold exposure 12/05/2015  ? Photosensitivity dermatitis 09/16/2017  ? Rib  fracture 02/08/2021  ? Solar lentigo 08/08/2015  ? Vaccine counseling 12/28/2020  ? Weight loss 10/15/2016  ? ? ?Past Surgical History: ?Past Surgical History:  ?Procedure Laterality Date  ? CHEST TUBE INSERTION Right 11/11

## 2022-03-08 NOTE — Consult Note (Signed)
? ?NAME:  [name], MRN:  [id number], DOB:  February 05, 1976, LOS: 0 ?ADMISSION DATE:  03/08/2022, CONSULTATION DATE:  03/08/2022 ? ?REFERRING MD:  [name], EDP CHIEF COMPLAINT:  hemoptysis  ? 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 ?He reports trace hemoptysis for many years but developed sudden onset coughing up frank blood this morning with increased shortness of breath.  Denies preceding fevers, URI symptoms or sick contacts.  He has been compliant with his medications.  He was brought in by EMS, saturation 99% on 5 L nasal cannula, 95% on room air ?Chest x-ray showed bilateral upper lobe airspace opacities right more than left, CBC showed leukocytosis with left shift ? 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 from [place] ?Chronic pulmonary emboli -not on anticoagulation due to hemoptysis ? ?Osteoporosis ?Chronic hep B ? ? 05/2014 >Spiro fev1 1.7L/40%, ratio 55  ? ?Per 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  ?2016 Hospitalzed with hemoptysis >IR guided embolization . FOB/BAL + Saccharomyces on fungal fx . Serum Galactomannan was neg.  ?[name] evaluation 04/2017 for  Transplant -rejected. Felt to early for transplant - recommended pre-transplant weight goal 128 lbs for a BMI > 19 ? ?Significant Hospital Events: ?Including procedures, antibiotic start and stop dates in addition to other pertinent events   ? ? ?Interim History / Subjective:  ? ?25 to 50 cc of frank hemoptysis ? ?Objective   ?Blood pressure 110/84, pulse 66, temperature 99.3 ?F (37.4 ?C), temperature source Oral, resp. rate 16, height _0  (1.727 m), weight 56.7 kg, SpO2 100 %. ?   ?   ?No intake or output data in the 24 hours ending 03/08/22 1426 ?Filed Weights  ? 03/08/22 1158  ?Weight: 56.7 kg  ? ? ?Examination: ?General: Cachectic man,Sitting up in ED stretcher, able to speak in full sentences ?HENT: Mild pallor, no icterus, no JVD ?Lungs: Decreased breath sounds bilateral, no rhonchi, no accessory muscle use ?Cardiovascular: S1-S2 regular,  loud P2 ?Abdomen: Soft, nontender  ?Extremities: Multiple tattoos, no deformity, clubbing++ ?Neuro: Alert, interactive, nonfocal, no asterixis ? ? ?Resolved Hospital Problem list   ? ? ?Assessment & Plan:  ?Severe hemoptysis with a history of life-threatening massive hemoptysis in the past requiring bronchial artery embolization in 2016 ?Chest x-ray suggest multifocal pneumonia although this could very well be aspirated blood .  He has history of ABPA, bullous emphysema/bronchiectasis ? ?Recommend ?-Hospital admission ?-IV ceftriaxone/azithromycin for community-acquired pneumonia ?-We will obtain CT angiogram chest to clarify side of hemoptysis, this will also enable evaluation for CTEPH.  If imaging does not clarify this, may need bronchoscopy ?-Plan for massive hemoptysis would be to attempt bronchial artery embolization ?-Use antitussives meantime to abolish cough ?-Hold Breztri while in the hospital and can use Pulmicort/Brovana/DuoNeb's combination ?-No evidence of exacerbation,  can continue home dose of prednisone ?-Keep n.p.o. in case bronchoscopy required ? ?Best  Practice (right click and "Reselect all SmartList Selections" daily)  ? ?Per [facility] ? ?Labs   ?CBC: ?Recent Labs  ?Lab 03/08/22 ?1303  ?WBC 14.5*  ?NEUTROABS 13.9*  ?HGB 13.1  ?HCT 39.3  ?MCV 97.0  ?PLT 247  ? ? ?Basic Metabolic Panel: ?No results for input(s): NA, K, CL, CO2, GLUCOSE, BUN, CREATININE, CALCIUM, MG, PHOS in the last 168 hours. ?GFR: ?CrCl cannot be calculated (Patient's most recent lab result is older than the maximum 21 days allowed.). ?Recent Labs  ?Lab 03/08/22 ?1303  ?WBC 14.5*  ? ? ?Liver Function Tests: ?No results for input(s): AST, ALT, ALKPHOS, BILITOT, PROT, ALBUMIN in the last 168 hours. ?No results for input(s): LIPASE, AMYLASE in the last 168 hours. ?No results for input(s): AMMONIA in the last 168 hours. ? ?ABG ?   ?Component Value Date/Time  ? PHART 7.427 09/13/2018 0445  ? PCO2ART 43.8 09/13/2018 0445  ? PO2ART 152 (H) 09/13/2018 0445  ? HCO3 28.4 (H) 09/13/2018 0445  ? TCO2 26 10/13/2015 0420  ? ACIDBASEDEF 2.0 12/22/2010 1522  ? O2SAT 99.2 09/13/2018 0445  ?  ? ?Coagulation Profile: ?Recent Labs  ?Lab 03/08/22 ?1303  ?INR 1.0  ? ? ?Cardiac Enzymes: ?No results for input(s): CKTOTAL, CKMB, CKMBINDEX, TROPONINI in the last 168 hours. ? ?HbA1C: ?Hgb A1c MFr Bld  ?Date/Time Value Ref Range Status  ?08/06/2019 11:39 AM 5.1 <5.7 % of total Hgb Final  ?  Comment:  ?  For the purpose of screening for the presence of ?diabetes: ?. ?<5.7%       Consistent with the absence of diabetes ?5.7-6.4%    Consistent with increased risk for diabetes ?            (prediabetes) ?> or =6.5%  Consistent with diabetes ?Marland Kitchen ?This assay result is consistent with a decreased risk ?of diabetes. ?. ?Currently, no consensus exists regarding use of ?hemoglobin A1c for diagnosis of diabetes in children. ?. ?According to American Diabetes Association (ADA) ?guidelines, hemoglobin A1c <7.0% represents optimal ?control in non-pregnant diabetic patients. Different ?metrics may apply to specific patient populations.   ?Standards of Medical Care in Diabetes(ADA). ?. ?  ?12/17/2010 03:45 AM  <5.7 % Final  ? 5.4 ?(NOTE)                                                                       According to the ADA Clinical Practice Recommendations for 2011, when HbA1c is used as a screening test:   >=6.5%   Diagnostic of Diabetes Mellitus           (if abnormal result ? is confirmed)  5.7-6.4%   Increased risk of developing Diabetes Mellitus  References:Diagnosis and Classification of Diabetes Mellitus,Diabetes WUJW,1191,47(WGNFA 1):S62-S69 and Standards of Medical Care in         Diabetes - 2011,Diabetes OZHY,8657,84  ?(Suppl 1):S11-S61.  ? ? ?CBG: ?No results for input(s): GLUCAP in the last 168 hours. ? ?Review of Systems:   ?Coughing up blood ?Shortness of breath ? ?Constitutional: negative for anorexia, fevers and sweats  ?Eyes: negative for irritation, redness and visual disturbance  ?Ears, nose, mouth, throat, and face: negative for  earaches, epistaxis, nasal congestion and sore throat  ?Cardiovascular: negative for chest pain, lower extremity edema, orthopnea, palpitations and syncope  ?Gastrointestinal: negative for abdominal pain, constipation, diarrhea, melena, nausea and vomiting  ?Genitourinary:negative for dysuria, frequency and hematuria  ?Hematologic/lymphatic: negative for bleeding, easy bruising and lymphadenopathy  ?Musculoskeletal:negative for arthralgias, muscle weakness and stiff joints  ?Neurological: negative for coordination problems, gait problems, headaches and weakness  ?Endocrine: negative for diabetic symptoms including polydipsia, polyuria and weight loss ? ? ?Past Medical History:  ?He,  has a past medical history of Anemia, 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[place]), Chronic viral hepatitis B without delta-agent ([place]) (11/23/2020), Drug rash (12/16/2017), Dyspnea, Esophageal reflux, Hypothyroidism, Lung disease, bullous ([place]), MAI (mycobacterium avium-intracellulare) ([place]), Mold exposure (12/05/2015), Photosensitivity dermatitis  (09/16/2017), Rib fracture (02/08/2021), Solar lentigo (08/08/2015), Vaccine counseling (12/28/2020), and Weight loss (10/15/2016).  ? ?Surgical History:  ? ?Past Surgical History:  ?Procedure Laterality Date  ? CHES

## 2022-03-09 ENCOUNTER — Inpatient Hospital Stay (HOSPITAL_COMMUNITY): Payer: BC Managed Care – PPO

## 2022-03-09 DIAGNOSIS — Z902 Acquired absence of lung [part of]: Secondary | ICD-10-CM | POA: Diagnosis not present

## 2022-03-09 DIAGNOSIS — E039 Hypothyroidism, unspecified: Secondary | ICD-10-CM | POA: Diagnosis present

## 2022-03-09 DIAGNOSIS — B4481 Allergic bronchopulmonary aspergillosis: Secondary | ICD-10-CM | POA: Diagnosis present

## 2022-03-09 DIAGNOSIS — B181 Chronic viral hepatitis B without delta-agent: Secondary | ICD-10-CM | POA: Diagnosis present

## 2022-03-09 DIAGNOSIS — B440 Invasive pulmonary aspergillosis: Secondary | ICD-10-CM | POA: Diagnosis present

## 2022-03-09 DIAGNOSIS — A310 Pulmonary mycobacterial infection: Secondary | ICD-10-CM | POA: Diagnosis present

## 2022-03-09 DIAGNOSIS — J439 Emphysema, unspecified: Secondary | ICD-10-CM | POA: Diagnosis not present

## 2022-03-09 DIAGNOSIS — I361 Nonrheumatic tricuspid (valve) insufficiency: Secondary | ICD-10-CM | POA: Diagnosis not present

## 2022-03-09 DIAGNOSIS — Z880 Allergy status to penicillin: Secondary | ICD-10-CM | POA: Diagnosis not present

## 2022-03-09 DIAGNOSIS — J189 Pneumonia, unspecified organism: Secondary | ICD-10-CM | POA: Diagnosis present

## 2022-03-09 DIAGNOSIS — Z681 Body mass index (BMI) 19 or less, adult: Secondary | ICD-10-CM | POA: Diagnosis not present

## 2022-03-09 DIAGNOSIS — J9621 Acute and chronic respiratory failure with hypoxia: Secondary | ICD-10-CM | POA: Diagnosis present

## 2022-03-09 DIAGNOSIS — I517 Cardiomegaly: Secondary | ICD-10-CM | POA: Diagnosis not present

## 2022-03-09 DIAGNOSIS — G9340 Encephalopathy, unspecified: Secondary | ICD-10-CM

## 2022-03-09 DIAGNOSIS — R042 Hemoptysis: Secondary | ICD-10-CM | POA: Diagnosis present

## 2022-03-09 DIAGNOSIS — J9611 Chronic respiratory failure with hypoxia: Secondary | ICD-10-CM | POA: Diagnosis not present

## 2022-03-09 DIAGNOSIS — I2782 Chronic pulmonary embolism: Secondary | ICD-10-CM | POA: Diagnosis present

## 2022-03-09 DIAGNOSIS — Z888 Allergy status to other drugs, medicaments and biological substances status: Secondary | ICD-10-CM | POA: Diagnosis not present

## 2022-03-09 DIAGNOSIS — I272 Pulmonary hypertension, unspecified: Secondary | ICD-10-CM | POA: Diagnosis present

## 2022-03-09 DIAGNOSIS — I77810 Thoracic aortic ectasia: Secondary | ICD-10-CM | POA: Diagnosis not present

## 2022-03-09 DIAGNOSIS — J15211 Pneumonia due to Methicillin susceptible Staphylococcus aureus: Secondary | ICD-10-CM | POA: Diagnosis present

## 2022-03-09 DIAGNOSIS — R0602 Shortness of breath: Secondary | ICD-10-CM | POA: Diagnosis not present

## 2022-03-09 DIAGNOSIS — K219 Gastro-esophageal reflux disease without esophagitis: Secondary | ICD-10-CM | POA: Diagnosis present

## 2022-03-09 DIAGNOSIS — J430 Unilateral pulmonary emphysema [MacLeod's syndrome]: Secondary | ICD-10-CM | POA: Diagnosis present

## 2022-03-09 DIAGNOSIS — I471 Supraventricular tachycardia: Secondary | ICD-10-CM | POA: Diagnosis present

## 2022-03-09 DIAGNOSIS — R7881 Bacteremia: Secondary | ICD-10-CM | POA: Diagnosis present

## 2022-03-09 DIAGNOSIS — G928 Other toxic encephalopathy: Secondary | ICD-10-CM | POA: Diagnosis present

## 2022-03-09 DIAGNOSIS — R778 Other specified abnormalities of plasma proteins: Secondary | ICD-10-CM | POA: Diagnosis present

## 2022-03-09 DIAGNOSIS — J9600 Acute respiratory failure, unspecified whether with hypoxia or hypercapnia: Secondary | ICD-10-CM

## 2022-03-09 DIAGNOSIS — J470 Bronchiectasis with acute lower respiratory infection: Secondary | ICD-10-CM | POA: Diagnosis present

## 2022-03-09 DIAGNOSIS — J9601 Acute respiratory failure with hypoxia: Secondary | ICD-10-CM | POA: Diagnosis not present

## 2022-03-09 DIAGNOSIS — R64 Cachexia: Secondary | ICD-10-CM | POA: Diagnosis present

## 2022-03-09 DIAGNOSIS — E43 Unspecified severe protein-calorie malnutrition: Secondary | ICD-10-CM | POA: Diagnosis present

## 2022-03-09 HISTORY — DX: Pneumonia, unspecified organism: J18.9

## 2022-03-09 LAB — PROTIME-INR
INR: 1 (ref 0.8–1.2)
Prothrombin Time: 13.1 seconds (ref 11.4–15.2)

## 2022-03-09 LAB — CBC
HCT: 40.3 % (ref 39.0–52.0)
HCT: 41 % (ref 39.0–52.0)
Hemoglobin: 13.7 g/dL (ref 13.0–17.0)
Hemoglobin: 13.6 g/dL (ref 13.0–17.0)
MCH: 32.6 pg (ref 26.0–34.0)
MCH: 32.7 pg (ref 26.0–34.0)
MCHC: 34 g/dL (ref 30.0–36.0)
MCHC: 33.2 g/dL (ref 30.0–36.0)
MCV: 96 fL (ref 80.0–100.0)
MCV: 98.6 fL (ref 80.0–100.0)
Platelets: 251 10*3/uL (ref 150–400)
Platelets: 238 10*3/uL (ref 150–400)
RBC: 4.2 MIL/uL — ABNORMAL LOW (ref 4.22–5.81)
RBC: 4.16 MIL/uL — ABNORMAL LOW (ref 4.22–5.81)
RDW: 13 % (ref 11.5–15.5)
RDW: 13 % (ref 11.5–15.5)
WBC: 9.1 10*3/uL (ref 4.0–10.5)
WBC: 13.4 10*3/uL — ABNORMAL HIGH (ref 4.0–10.5)
nRBC: 0 % (ref 0.0–0.2)
nRBC: 0 % (ref 0.0–0.2)

## 2022-03-09 LAB — APTT: aPTT: 36 seconds (ref 24–36)

## 2022-03-09 LAB — GLUCOSE, CAPILLARY
Glucose-Capillary: 130 mg/dL — ABNORMAL HIGH (ref 70–99)
Glucose-Capillary: 172 mg/dL — ABNORMAL HIGH (ref 70–99)

## 2022-03-09 LAB — TSH: TSH: 7.633 u[IU]/mL — ABNORMAL HIGH (ref 0.350–4.500)

## 2022-03-09 LAB — MRSA NEXT GEN BY PCR, NASAL: MRSA by PCR Next Gen: NOT DETECTED

## 2022-03-09 MED ORDER — MIDAZOLAM HCL 2 MG/2ML IJ SOLN
INTRAMUSCULAR | Status: AC
  Filled 2022-03-09: qty 2

## 2022-03-09 MED ORDER — ROCURONIUM BROMIDE 10 MG/ML (PF) SYRINGE
PREFILLED_SYRINGE | INTRAVENOUS | Status: AC
  Filled 2022-03-09: qty 10

## 2022-03-09 MED ORDER — SUCCINYLCHOLINE CHLORIDE 200 MG/10ML IV SOSY
PREFILLED_SYRINGE | INTRAVENOUS | Status: AC
  Filled 2022-03-09: qty 10

## 2022-03-09 MED ORDER — ONDANSETRON HCL 4 MG/2ML IJ SOLN
4.0000 mg | Freq: Three times a day (TID) | INTRAMUSCULAR | Status: DC | PRN
  Administered 2022-03-09: 4 mg via INTRAVENOUS
  Filled 2022-03-09: qty 2

## 2022-03-09 MED ORDER — TRANEXAMIC ACID FOR INHALATION
500.0000 mg | Freq: Three times a day (TID) | RESPIRATORY_TRACT | Status: AC
  Administered 2022-03-09 – 2022-03-12 (×9): 500 mg via RESPIRATORY_TRACT
  Filled 2022-03-09 (×10): qty 10

## 2022-03-09 MED ORDER — CHLORHEXIDINE GLUCONATE CLOTH 2 % EX PADS
6.0000 | MEDICATED_PAD | Freq: Every day | CUTANEOUS | Status: DC
  Administered 2022-03-09 – 2022-03-15 (×7): 6 via TOPICAL

## 2022-03-09 MED ORDER — ORAL CARE MOUTH RINSE
15.0000 mL | Freq: Two times a day (BID) | OROMUCOSAL | Status: DC
  Administered 2022-03-10: 15 mL via OROMUCOSAL

## 2022-03-09 MED ORDER — PIPERACILLIN-TAZOBACTAM 3.375 G IVPB
3.3750 g | Freq: Three times a day (TID) | INTRAVENOUS | Status: DC
  Administered 2022-03-09 – 2022-03-12 (×8): 3.375 g via INTRAVENOUS
  Filled 2022-03-09 (×9): qty 50

## 2022-03-09 MED ORDER — FUROSEMIDE 10 MG/ML IJ SOLN
40.0000 mg | Freq: Once | INTRAMUSCULAR | Status: AC
  Administered 2022-03-09: 40 mg via INTRAVENOUS
  Filled 2022-03-09: qty 4

## 2022-03-09 MED ORDER — HYDROCODONE BIT-HOMATROP MBR 5-1.5 MG/5ML PO SOLN: 5.0000 mL | Freq: Three times a day (TID) | ORAL | Status: DC

## 2022-03-09 MED ORDER — KETAMINE HCL 50 MG/5ML IJ SOSY
PREFILLED_SYRINGE | INTRAMUSCULAR | Status: AC
  Filled 2022-03-09: qty 5

## 2022-03-09 MED ORDER — ONDANSETRON HCL 4 MG/2ML IJ SOLN: 4.0000 mg | Freq: Three times a day (TID) | INTRAMUSCULAR | Status: DC

## 2022-03-09 MED ORDER — ADULT MULTIVITAMIN W/MINERALS CH
1.0000 | ORAL_TABLET | Freq: Every day | ORAL | Status: DC
Start: 2022-03-09 — End: 2022-03-10
  Administered 2022-03-10: 1 via ORAL
  Filled 2022-03-09: qty 1

## 2022-03-09 MED ORDER — CHLORHEXIDINE GLUCONATE 0.12% ORAL RINSE (MEDLINE KIT)
15.0000 mL | Freq: Two times a day (BID) | OROMUCOSAL | Status: DC
  Administered 2022-03-09 – 2022-03-11 (×4): 15 mL via OROMUCOSAL

## 2022-03-09 MED ORDER — ETOMIDATE 2 MG/ML IV SOLN
INTRAVENOUS | Status: AC
  Filled 2022-03-09: qty 20

## 2022-03-09 MED ORDER — FENTANYL CITRATE (PF) 100 MCG/2ML IJ SOLN
INTRAMUSCULAR | Status: AC
  Filled 2022-03-09: qty 2

## 2022-03-09 MED ORDER — ENSURE ENLIVE PO LIQD: 237.0000 mL | Freq: Two times a day (BID) | ORAL | Status: DC

## 2022-03-09 MED ORDER — ORAL CARE MOUTH RINSE: 15.0000 mL | OROMUCOSAL | Status: DC

## 2022-03-09 NOTE — Progress Notes (Signed)
eLink Physician-Brief Progress Note ?Patient Name: [name] ?DOB: [birth year] ?MRN: [id number] ? ? ?Date of Service ? 03/09/2022  ?HPI/Events of Note ? Patient with a complicated prior pulmonary history admitted with hemoptysis, mass-like lung consolidation, and acute hypoxemic respiratory failure.  ?eICU Interventions ? New Patient Evaluation.  ? ? ? ?  ? ?Kerry Kass [name] ?03/09/2022, 8:38 PM ?

## 2022-03-09 NOTE — Progress Notes (Signed)
Initial Nutrition Assessment ? ?DOCUMENTATION CODES:  ? ?Severe malnutrition in context of chronic illness ? ?INTERVENTION:  ? ?Multivitamin w/ minerals daily ?Ensure Enlive po BID, each supplement provides 350 kcal and 20 grams of protein. ?Magic cup BID with meals, each supplement provides 290 kcal and 9 grams of protein ?Recommend regular diet after completion of bronchoscopy due to malnutrition  ? ?NUTRITION DIAGNOSIS:  ? ?Severe Malnutrition related to chronic illness (MAC infection) as evidenced by severe muscle depletion, severe fat depletion. ? ?GOAL:  ? ?Patient will meet greater than or equal to 90% of their needs ? ?MONITOR:  ? ?PO intake, Supplement acceptance, Labs, Weight trends ? ?REASON FOR ASSESSMENT:  ? ?Consult ?Assessment of nutrition requirement/status ? ?ASSESSMENT:  ? ?46 y.o. male presented to the ED with hemoptysis. PMH includes Mycobacterium Avium Complex infection, aspergilloma s/p LUL lobectomy, GERD, and chronic Hepatitis B. Pt admitted with community acquired pneumonia, hemoptysis and bullous emphysema.  ? ?Pt reports that he knows that he does not eat as good as he should at home. States that he only eats on average 1-2 meals per day due to poor appetite and limited on time. Reports that he eats eggs, noodles, and take out. States that he hasn't been drinking any ONS at home due to running out.  ?Per EMR, pt ate 100% of his dinner last night. ? ?Pt reports that his weight is typically in the 120's# and denies any recent weight loss. Per EMR, pt has not had any weight loss within the past year. ? ?Discussed the use of ONS if pt appetite is poor or doesn't have time to eat a full meal. Pt willing to drink Ensure and try Magic Cup.  ? ?Medications reviewed and include: Zithromax, Ethambutol, Prednisone, IV antibiotics  ?Labs reviewed. ? ?NUTRITION - FOCUSED PHYSICAL EXAM: ? ?Flowsheet Row Most Recent Value  ?Orbital Region Severe depletion  ?Upper Arm Region Severe depletion  ?Thoracic  and Lumbar Region Severe depletion  ?Buccal Region Severe depletion  ?Temple Region Moderate depletion  ?Clavicle Bone Region Severe depletion  ?Clavicle and Acromion Bone Region Severe depletion  ?Scapular Bone Region Severe depletion  ?Dorsal Hand Severe depletion  ?Patellar Region Severe depletion  ?Anterior Thigh Region Severe depletion  ?Posterior Calf Region Severe depletion  ?Edema (RD Assessment) None  ?Hair Reviewed  ?Eyes Reviewed  ?Mouth Reviewed  ?Skin Reviewed  ?Nails Reviewed  ? ?Diet Order:   ?Diet Order   ? ? None  ? ?  ? ? ?EDUCATION NEEDS:  ? ?No education needs have been identified at this time ? ?Skin:  Skin Assessment: Reviewed RN Assessment ? ?Last BM:  4/19 ? ?Height:  ?Ht Readings from Last 1 Encounters:  ?03/08/22 5' 8" (1.727 m)  ? ?Weight:  ?Wt Readings from Last 1 Encounters:  ?03/08/22 56.7 kg  ? ?Ideal Body Weight:  70 kg ? ?BMI:  Body mass index is 19.01 kg/m?. ? ?Estimated Nutritional Needs:  ?Kcal:  1800-2000 ?Protein:  90-105 grams ?Fluid:  >/= 1.8 L ? ? ? ?Hermina Barters RD, LDN ?Clinical Dietitian ?See AMiON for contact information.  ? ?

## 2022-03-09 NOTE — Progress Notes (Signed)
Patient arrived to unit 2M14 with the following patient belongings.  ?- 1 Cell Phone ?- 1 Black cell phone charger ?- 2 T-Shirts ?- 1 Pair of pants ?- 2 socks ?- 1 Pair of sneakers ?- Eye Glasses ?

## 2022-03-09 NOTE — Progress Notes (Signed)
? ?NAME:  [name], MRN:  [id number], DOB:  17-May-1976, LOS: 0 ?ADMISSION DATE:  03/08/2022, CONSULTATION DATE:  03/09/2022 ? ?REFERRING MD:  [name], EDP CHIEF COMPLAINT:  hemoptysis  ? 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 He reports trace hemoptysis for many years but developed sudden onset coughing up frank blood this morning with increased shortness of breath.  Denies preceding fevers, URI symptoms or sick contacts.  He has been compliant with his medications.  He was brought in by EMS, saturation 99% on 5 L nasal cannula, 95% on room air ?Chest x-ray showed bilateral upper lobe airspace opacities right more than left, CBC showed leukocytosis with left shift. ? ?4/21 1930 PCCM called to bedside for patient coughing up blood and in respiratory distress. Patient was placed on NRB and suctioned. Patient was tripod breathing and using accessory mucless. Tachy 130s and tachypneic. Patient began to vomit and went bradycardic. Patient then went less responsive and not following commands. Upward gaze. Minimal response to painful stimuli. Transfer patient to ICU. Upon arrival to ICU patient began to respond and arousable. Resp distress improved and weaned to [place]. ? 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 from [place] ?Chronic pulmonary emboli -not on anticoagulation due to  hemoptysis ? ?Osteoporosis ?Chronic hep B ? ?05/2014 >Spiro fev1 1.7L/40%, ratio 55  ? ?Per 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  ?2016 Hospitalzed with hemoptysis >IR guided embolization . FOB/BAL + Saccharomyces on fungal fx . Serum Galactomannan was neg.  ?[name] evaluation 04/2017 for Transplant -rejected. Felt to early for transplant - recommended pre-transplant weight goal 128 lbs for a BMI > 19 ? ?Significant Hospital Events: ?Including procedures, antibiotic start and stop dates in addition to other pertinent events   ?4/20 Admit with hemoptysis, 25-50 cc frank hemoptysis  ?4/21 Estimated 25 ml hemoptysis; developed worsening SOB and encephalopathy (possibly aspirated), transferred to ICU and resp distress/encephalopathy improved; EEG pending to r/o seizure ? ?Interim History / Subjective:  ?1930 PCCM called to bedside for patient coughing up blood and in respiratory distress. Patient was placed on NRB and suctioned. Patient was tripod breathing and using accessory mucless. Tachy 130s and tachypneic. Patient began to vomit and went bradycardic. Patient then went less responsive and not following commands. Upward gaze. Minimal response to painful stimuli. Transfer patient to ICU. Upon arrival to ICU patient began to respond and arousable. Resp distress improved and weaned to [place]. ? ?Objective   ?Blood pressure 101/74, pulse 62, temperature 98.9 ?F (37.2 ?C), temperature source Oral, resp. rate 17, height _0  (1.727 m), weight 56.7 kg, SpO2 98 %. ?   ?   ? ?Intake/Output Summary (Last 24 hours) at 03/09/2022 2014 ?Last data filed at 03/09/2022 1754 ?Gross per 24 hour  ?Intake 2103.59 ml  ?  Output 1000 ml  ?Net 1103.59 ml  ? ? ?Filed Weights  ? 03/08/22 1158  ?Weight: 56.7 kg   ? ? ?Examination: ? ?General:  critically ill thin appearing M in resp distress ?HEENT: MM pink/moist; [place] in place ?Neuro: Aox3; MAE ?CV: s1s2, sinus tachy 130s, no m/r/g ?PULM:  rhonchi BS R>L; RR 20s; On [place] 6 L ?GI: soft, bsx4 active  ?Extremities: warm/dry, no edema  ?Skin: no rashes or lesions appreciated ? ? ?Resolved Hospital Problem list   ? ? ?Assessment & Plan:  ? ?Acute respiratory failure with hypoxia ?Possible aspiration ?P: ?-continue [place] for sats >92% ?-dc rocephin and ordered zosyn for asp ppx ?-giving 40 lasix ?-cough suppressant ordered ?-TXA nebs ordered for hemoptysis ?-check PT/PTT/INR/CBC ?-hold on ABG with improvement in mental status and SOB ?-hx of PE; CTA negative on admission for pe; will check b/l dvt US ? ?Acute encephalopathy: improved ?P: ?-EEG to r/o seizure ?-will hold on CT head with mental status change ?-limit sedating meds ? ?Severe hemoptysis  ?Suspected CAP ?Bullous Emphysema ?MAC infection: continue azithromycin, ethambutol and [place] ?ABPA w/ high IgE ?Hx Aspergilloma s/p VATS ?Pulmonary HTN ?Chronic PE: not seen on most recent CTA on admission ?-history of life-threatening massive hemoptysis in the past requiring bronchial artery embolization in 2016. Chest x-ray suggest multifocal pneumonia although this could very well be aspirated blood .  He has history of ABPA, bullous emphysema/bronchiectasis.  CTA chest 4/21 negative for PE, shows known extensive chronic bullous emphysema & bronchiectasis. Additional new finding of 4x8cm mass like area in the posterior aspect of the RUL bulae into the posterior aspect of the RLL. Differential dx of PNA, atelectasis, hemoptysis, neoplasm, opportunistic infection with chronic steroid use and fungal disease.   ?P: ?-continue zosyn, azithromycin for aspiration/CAP ppx ?-bronchoscopy scheduled for 4/22; npo after midnight ?-may need bronchial artery embolization if massive hemoptysis ?-continue antitussives for cough suppression  ?-  pulmicort/brovana, duoneb prn for wheezing ?-continue home prednisone ?-f/u strep/legionella antigen ? ? ?Hypothyroidism ?P: ?Cont synthroid ? ?Chronic Hep B ?P: ?-supportive care ? ? ? ?Best Practice (right click and "Reselect all SmartList Selections" daily)  ? ?Best Practice (right click and "Reselect all SmartList Selections" daily)  ? ?Diet/type: NPO after midnight for bronch 4/22 ?DVT prophylaxis: SCD ?GI prophylaxis: N/A ?Lines: N/A ?Foley:  N/A ?Code Status:  full code ?Last date of multidisciplinary goals of care discussion [pending] ? ? ?  ?  ?Mikki Harbor, PA-C ?[facility] ?03/09/2022, 9:04 PM ? ?Please see [email or web address] for pager details. ? ?From 7A-7P if no response, please call [phone]. ?After hours, please call Warren Lacy [phone]. ? ? ? ? ? ? ?

## 2022-03-09 NOTE — Significant Event (Signed)
Rapid Response Event Note  ? ?Reason for Call :  ?Decreased LOC, respiratory distress, coughing up blood. ? ?Per RN, pt was alert and oriented at shift change. He then had a brief moment of bradycardia(30s) and hypoxia(86% on RA). When staff went to check on him he was in respiratory distress. He then vomited and became really restless with decreased LOC. ? ?Initial Focused Assessment:  ?Pt lying in bed in respiratory distress, +WOB, +accessory muscle use, congested breathing. He has an upward gaze, is not speaking, and will not follow commands. With deep painful stimulation he will move his BLE only. He does have a weak gag with deep oral suctioning. Bright red blood suctioned out of back of throat. Lungs with rhonchi/crackles t/o . Skin warm and dry.  ? ?HR-124, BP-173/111, RR-40, SpO2-99% on NRB ? ? ?Interventions:  ?EKG-ST ?NRB ?PCXR ?PCCM consulted: ?Tx to 2M14 ?Plan of Care:  ?Tx to 2M14. ? ?Event Summary:  ? ?MD Notified: FMTS at bedside on my arrival, PCCM consulted and came to bedside ?Call Time:1949 ?Arrival BZJI:[id number] ?End Time:2030 ? ?Dillard Essex, RN ?

## 2022-03-09 NOTE — Progress Notes (Signed)
Family Medicine Teaching Service ?Daily Progress Note ?Intern Pager: [phone] ? ?Patient name: [name] Medical record number: [id number] ?Date of birth: 02/07/1976 Age: 46 y.o. Gender: male ? ?Primary Care Provider: Robert Bellow, PA-C ?Consultants: Pulmonology ?Code Status: Full ? ?Pt Overview and Major Events to Date:  ?4/21- admitted ? ?Assessment and Plan: ?[name] is a 45 y.o. male presenting with hemoptysis . PMH is significant for Mycobacterium avium infection, allergic bronchopulmonary aspergillosis, history of aspergilloma s/p LUL lobectomy, bullous emphysema, hx of massive hemoptysis with IR embolization in 2016, hx of intrabronchial valve placement for pneumothorax in 2019, chronic pulmonary emboli-not on anticoagulation due to hemoptysis, and chronic hepatitis B.  ? ?Hemoptysis  Suspected CAP  Bullous Emphysema  Pulmonary HTN ?Patient remained afebrile and SORA overnight.WBC 14.5>9.1. Hgb stable 13.1>13.7. Hemoptysis has slowed with aggressive anti-tussive therapy with Tussionex, only ~41m bloody sputum in emesis bag overnight. Will need pulmonary's guidance on needed follow-up for large mass-like area identified on CT--likely to require bronchoscopy.  Per his last pulmonology note, he was deferred for transplant until he got his BMI to >19, note that he has reached that point this time.  Perhaps he has reached the point that he completely eligible for a lung transplant. ?- Pulmonology following, appreciate recs ?- NPO in case needs bronchoscopy ?- Continue Azithromycin, can likely transition CTX to orals ?- Continue scheduled Tussionex ?- Pulmicort nebs BID ?- Duonebs q4h PRN ?- Holding home HTS nebs ?- F/u S pneumo and legionella urine antigen ? ?MAC Infection  ABPA with High IgE  Hx Aspergilloma s/p VATS  ?- Continue home Azithromycin, Ethambutol, and Cresemba ? ?Hypothyroidism ?TSH 7.633, possible this represent euthyroid sick syndrome.  ?- Continue home synthroid 109m  daily ? ?Cachexia ?RD to see for nutritional assessment.  ?- F/u RD recs ?- Mirtazapine 4546mHS ? ?Chronic, Stable Conditions ?Chronic Hep B- not contributing to presentation at this time ?Chronic PE- not seen on most recent CTA ? ?FEN/GI: NPO ahead of possible bronch, will give regular diet if pulm has no plans for bronch ?PPx: SCDs, high bleed risk ?Dispo:Pending further workup for mass-like lesion.  ? ?Subjective:  ?[name] feeling generally well this morning.  He has no acute complaints.  He is pleased his hemoptysis has slowed, but he still has some residual cough despite Tussionex.  ? ?Objective: ?Temp:  [97.7 ?F (36.5 ?C)-99.3 ?F (37.4 ?C)] 97.7 ?F (36.5 ?C) (04/21 0310) ?Pulse Rate:  [60-73] 70 (04/21 0400) ?Resp:  [16-27] 18 (04/21 0400) ?BP: (101-114)/(67-84) 103/67 (04/21 0400) ?SpO2:  [92 %-100 %] 93 % (04/21 0400) ?Weight:  [56.7 kg] 56.7 kg (04/20 1158) ?Physical Exam: ?General: Awake and alert, comfortable appearing and NAD ?Cardiovascular: RRR, no m/r/g ?Respiratory: Normal WOB on RA, diminished on L, crackles in RUL and RML ?Abdomen: Non-tender, non-distended ?Extremities: Clubbing noted, stable from previous ? ?Laboratory: ?Recent Labs  ?Lab 03/08/22 ?1303 03/09/22 ?[id number] 14.5* 9.1  ?HGB 13.1 13.7  ?HCT 39.3 40.3  ?PLT 247 251  ? ?Recent Labs  ?Lab 03/08/22 ?1303  ?NA 136  ?K 4.7  ?CL 105  ?CO2 21*  ?BUN 16  ?CREATININE 1.02  ?CALCIUM 8.6*  ?PROT 7.0  ?BILITOT 1.0  ?ALKPHOS 87  ?ALT 13  ?AST 25  ?GLUCOSE 97  ? ? ?Imaging/Diagnostic Tests: ? ?CT Angio Chest Pulmonary Embolism (PE) W or WO Contrast ?CLINICAL DATA:  Shortness of breath and hemoptysis. ? ?EXAM: ?CT ANGIOGRAPHY CHEST WITH CONTRAST ? ?TECHNIQUE: ?Multidetector CT imaging  of the chest was performed using the ?standard protocol during bolus administration of intravenous ?contrast. Multiplanar CT image reconstructions and MIPs were ?obtained to evaluate the vascular anatomy. ? ?RADIATION DOSE REDUCTION: This exam was  performed according to the ?departmental dose-optimization program which includes automated ?exposure control, adjustment of the mA and/or kV according to ?patient size and/or use of iterative reconstruction technique. ? ?CONTRAST:  32m OMNIPAQUE IOHEXOL 350 MG/ML SOLN ? ?COMPARISON:  June 04, 2020 ? ?FINDINGS: ?Cardiovascular: There is mild calcification of the aortic arch, ?without evidence of aortic aneurysm. The upper lobe branches of the ?bilateral pulmonary arteries are poorly identified, likely secondary ?to architectural distortion. No evidence of pulmonary embolism. ?Normal heart size. No pericardial effusion. ? ?Mediastinum/Nodes: No enlarged mediastinal, hilar, or axillary lymph ?nodes. Thyroid gland, trachea, and esophagus demonstrate no ?significant findings. ? ?Lungs/Pleura: Predominantly paraseptal emphysematous lung disease is ?seen with extensive chronic bullous changes noted. This is most ?prominent within the bilateral upper lobes and bilateral apices, ?with thick, septated bullae seen within this region. ? ?A 4.0 cm x 2.2 cm x 8.0 cm mass-like area is seen extending from ?within the posterior aspect of a large right upper lobe bullae into ?the posterior aspect of the right lower lobe (axial CT image 26 ?through 778 CT series 6). This represents a new finding when ?compared to the prior study. ? ?A mild amount of atelectasis is seen within the anterior and ?posterior aspects of the left upper lobe. ? ?Small areas of focal scarring and/or atelectasis are seen within the ?right lower lobe. ? ?Subcentimeter calcified lung nodules are seen bilaterally. ? ?There is no evidence of a pleural effusion or pneumothorax. ? ?Upper Abdomen: A 1.3 cm gallstone is seen within the lumen of an ?otherwise normal-appearing gallbladder. ? ?Musculoskeletal: No chest wall abnormality. No acute or significant ?osseous findings. ? ?Review of the MIP images confirms the above findings. ? ?IMPRESSION: ?1. No evidence of  pulmonary embolism. ?2. Predominantly paraseptal emphysematous lung disease with ?extensive chronic bullous changes. ?3. 4.0 cm x 2.2 cm x 8.0 cm mass-like area extending from within the ?posterior aspect of a large right upper lobe bullae into the ?posterior aspect of the right lower lobe. While this may represent a ?large area of atelectasis, the presence of an underlying neoplastic ?process cannot be excluded. Correlation with nuclear medicine PET/CT ?is recommended. ?4. Cholelithiasis. ? ?Aortic Atherosclerosis (ICD10-I70.0) and Emphysema (ICD10-J43.9). ? ?Electronically Signed ?  By: TVirgina NorfolkM.D. ?  On: 03/08/2022 16:07 ?DG Chest 2 View ?CLINICAL DATA:  Shortness of breath. History of pulmonary ?aspergillosis ? ?EXAM: ?CHEST - 2 VIEW ? ?COMPARISON:  02/21/2021 ? ?FINDINGS: ?Stable cardiomediastinal contours. Aortic atherosclerosis. Chronic ?architectural distortion and bullous emphysematous changes at the ?lung apices. Worsening bilateral upper lobe airspace opacities, ?right worse than left. No large pleural fluid collection. No ?evidence of pneumothorax. ? ?IMPRESSION: ?Worsening bilateral upper lobe airspace opacities, right worse than ?left. Appearance concerning for multifocal pneumonia. ? ?Electronically Signed ?  By: NDavina PokeD.O. ?  On: 03/08/2022 13:03 ? ? ? ?SEppie Gibson MD ?03/09/2022, 5:49 AM ?PGY-1, [facility] ?FEdenIntern pager: [phone] text pages welcome ? ?

## 2022-03-09 NOTE — Progress Notes (Signed)
Patient's heart rate dropped to 34 on the telemetry monitor. Day/Night Charge nurses and Primary RN went in to assess patient. He stated "I can't catch my breath." He was on room air, oxygen saturation 86%, and tachpnea with respirations 26. Heart increased to 120s with elevated blood pressure. Patient began vomiting blood. Suction set up at bedside and in use. 3L [place] oxygen placed on the patient. Non-rebreather at bedside.  ? ?Patient became increasingly anxious/restless and then unresponsive to commands. Gag reflex present to stimulation. He was previously alert and oriented to person, place, time and situation. Pupils pinpoint ,but equal round and reactive to light.  ? ?Family medicine doctors, rapid response nurse, and critical care MD paged, called, and at bedside. See new orders- ECG, chest xray, odansetron (See MAR). ? ?Patient transferred to ICU for higher level of care. Patient is at high risk for aspiration and intubation at this time.  ?

## 2022-03-09 NOTE — Significant Event (Signed)
FPTS Interim Progress Note ? ?Called to bedside by nursing to assess patient with Dr. Lamount Cranker experienced nausea and an episode of hemoptysis with a brief bradycardic episode to the 30s per nursing and need for oxygen with nonrebreather.  At bedside, nonrebreather was put on and patient expressed severe anxiety about his breathing initially but was responsive to verbal stimuli and to commands.  Additionally, he was newly tachycardic, tachypneic with elevated blood pressures.  During this time, we ordered an EKG given new tachycardia, a chest x-ray given increased likelihood of aspiration and new onset oxygen requirement, and gave Zofran for nausea.  After monitoring, patient became unresponsive to verbal and painful stimuli with upward gaze.  He remained tachycardic and tachypneic, did not appear to have any obvious seizure-like activity.  Given this acute change while at bedside as he was stable on room air throughout the course of the day today, we decided to consult PCCM and updated [place] attending, Dr. Gwendlyn Deutscher.  PCCM came to bedside to assess patient.  He was maintaining oxygen saturations with most acute concern being the acute change in mental status.  Given his encephalopathic changes without known cause, PCCM decided to transfer to the ICU unit for possible EEG, ABG. Will continue to follow along and pick back up on floor when indicated, patient is scheduled for bronchoscopy tomorrow, but this may not occur given acute event. Appreciate PCCM assistance.  ? ?Erskine Emery, MD ?03/09/2022, 9:13 PM ?PGY-1, [place] ?Service pager [phone] ? ?

## 2022-03-09 NOTE — Progress Notes (Signed)
Pharmacy Antibiotic Note ? ?[name] is a 46 y.o. male admitted on 03/08/2022 with pneumonia.  Pharmacy has been consulted for zosyn dosing. WBC wnl Tm 99 Cr cl 67m/min  ?Chest CT consolidation RUL ?Also on azithromycin  ?Plan: ?Zosyn 3.375g IV q8h (4 hour infusion). ? ?Height: _0  (172.7 cm) ?Weight: 56.7 kg (125 lb) ?IBW/kg (Calculated) : 68.4 ? ?Temp (24hrs), Avg:98.2 ?F (36.8 ?C), Min:97.7 ?F (36.5 ?C), Max:98.9 ?F (37.2 ?C) ? ?Recent Labs  ?Lab 03/08/22 ?1303 03/09/22 ?07939 ?WBC 14.5* 9.1  ?CREATININE 1.02  --   ?  ?Estimated Creatinine Clearance: 71.8 mL/min (by C-G formula based on SCr of 1.02 mg/dL).   ? ?Allergies  ?Allergen Reactions  ? Clarithromycin Other (See Comments)  ?  Adverse reaction w/ voriconazole ?  ? Rifaximin Other (See Comments)  ?  Adverse reaction w/ voriconazole ?  ? ? ?Antimicrobials this admission: ? ? ?Dose adjustments this admission: ? ? ?Microbiology results: ? ? ?LBonnita NasutiPharm.D. CPP, BCPS ?Clinical Pharmacist ?[phone]?03/09/2022 9:30 PM  ? ?

## 2022-03-09 NOTE — Progress Notes (Signed)
? ?NAME:  [name], MRN:  [id number], DOB:  1976/10/02, LOS: 0 ?ADMISSION DATE:  03/08/2022, CONSULTATION DATE:  03/09/2022 ? ?REFERRING MD:  [name], EDP CHIEF COMPLAINT:  hemoptysis  ? 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 He reports trace hemoptysis for many years but developed sudden onset coughing up frank blood this morning with increased shortness of breath.  Denies preceding fevers, URI symptoms or sick contacts.  He has been compliant with his medications.  He was brought in by EMS, saturation 99% on 5 L nasal cannula, 95% on room air ?Chest x-ray showed bilateral upper lobe airspace opacities right more than left, CBC showed leukocytosis with left shift. ? 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 from [place] ?Chronic pulmonary emboli -not on anticoagulation due to hemoptysis ? ?Osteoporosis ?Chronic hep B ? ?05/2014 >Spiro fev1 1.7L/40%, ratio 55  ? ?Per 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  ?2016 Hospitalzed with hemoptysis >IR guided embolization . FOB/BAL + Saccharomyces on fungal fx . Serum Galactomannan was neg.  ?[name] evaluation 04/2017 for  Transplant -rejected. Felt to early for transplant - recommended pre-transplant weight goal 128 lbs for a BMI > 19 ? ?Significant Hospital Events: ?Including procedures, antibiotic start and stop dates in addition to other pertinent events   ?4/20 Admit with hemoptysis, 25-50 cc frank hemoptysis  ?4/21 Estimated 25 ml hemoptysis  ? ?Interim History / Subjective:  ?Pt reports feeling "ok", some better  ?Denies fevers, chills but is always cold  ? ?Objective   ?Blood pressure 103/67, pulse 70, temperature 97.7 ?F (36.5 ?C), temperature source Oral, resp. rate 18, height 5' 8" (1.727 m), weight 56.7 kg, SpO2 95 %. ?   ?   ? ?Intake/Output Summary (Last 24 hours) at 03/09/2022 1048 ?Last data filed at 03/09/2022 0700 ?Gross per 24 hour  ?Intake 1687.11 ml  ?Output --  ?Net 1687.11 ml  ? ?Filed Weights  ? 03/08/22 1158  ?Weight: 56.7 kg  ? ? ?Examination: ?General: thin adult male lying in bed in NAD ?HEENT: MM pink/moist, wearing glasses, speech clear ?Neuro: AAOx4, speech clear, MAE ?CV: s1s2 RRR, no m/r/g ?PULM: non-labored at rest, diminished breath sounds with rhonchi ?GI: soft, bsx4 active  ?Extremities: warm/dry, no edema, clubbing + ?Skin: multiple tattoos, "sun spots" on forearms (pt reports from being on antifungals) ? ? ?Resolved Hospital Problem list   ? ? ?Assessment & Plan:  ? ?Severe hemoptysis with a history of life-threatening massive hemoptysis in the past requiring bronchial artery embolization in 2016. Chest x-ray suggest multifocal pneumonia although this could very well be aspirated blood .  He has history of ABPA, bullous emphysema/bronchiectasis.  CTA chest 4/21 negative for PE, shows known extensive chronic bullous emphysema & bronchiectasis. Additional new finding of 4x8cm mass like  area in the posterior aspect of the RUL bulae into the posterior aspect of the RLL. Differential dx of PNA, atelectasis, hemoptysis, neoplasm, opportunistic infection with chronic steroid use and fungal disease.    ? ?-continue ceftriaxone, azithromycin for possible CAP  ?-suspect he may need bronchoscopy for washings if not biopsy as it appears there is an airway into the area  ?-if he develops massive hemoptysis would need bronchial artery embolization  ?-antitussives for cough suppression  ?-hold Breztri while inpatient > use pulmocort/brovna, duoneb ?-continue home prednisone for now ?-follow up strep/legionella antigen ?-if no plan for FOB 4/21, allow diet.  Will discuss with MD ? ?Best Practice (right click and "Reselect all SmartList Selections" daily)  ?Per primary team  ? ?  ?  ?Noe Gens, MSN, APRN, NP-C, AGACNP-BC ?[facility] ?03/09/2022, 10:49 AM ? ? ?Please see [email or web address] for pager details.  ? ?From 7A-7P if no response, please call [phone] ?After hours, please call Warren Lacy [phone] ? ? ? ? ? ?

## 2022-03-10 ENCOUNTER — Encounter (HOSPITAL_COMMUNITY)
Admission: EM | Disposition: A | Discharge status: Home / Self Care | Payer: Self-pay | Source: Home / Self Care | Attending: Family Medicine

## 2022-03-10 ENCOUNTER — Inpatient Hospital Stay (HOSPITAL_COMMUNITY): Payer: BC Managed Care – PPO

## 2022-03-10 ENCOUNTER — Inpatient Hospital Stay (HOSPITAL_COMMUNITY): Payer: BC Managed Care – PPO | Admitting: Certified Registered Nurse Anesthetist

## 2022-03-10 DIAGNOSIS — G9340 Encephalopathy, unspecified: Secondary | ICD-10-CM

## 2022-03-10 DIAGNOSIS — R042 Hemoptysis: Secondary | ICD-10-CM | POA: Diagnosis not present

## 2022-03-10 DIAGNOSIS — R0602 Shortness of breath: Secondary | ICD-10-CM | POA: Diagnosis not present

## 2022-03-10 DIAGNOSIS — J9601 Acute respiratory failure with hypoxia: Secondary | ICD-10-CM

## 2022-03-10 DIAGNOSIS — J189 Pneumonia, unspecified organism: Secondary | ICD-10-CM | POA: Diagnosis not present

## 2022-03-10 HISTORY — PX: VIDEO BRONCHOSCOPY: SHX5072

## 2022-03-10 HISTORY — PX: BRONCHIAL WASHINGS: SHX5105

## 2022-03-10 LAB — BASIC METABOLIC PANEL
Anion gap: 8 (ref 5–15)
BUN: 11 mg/dL (ref 6–20)
CO2: 28 mmol/L (ref 22–32)
Calcium: 8 mg/dL — ABNORMAL LOW (ref 8.9–10.3)
Chloride: 103 mmol/L (ref 98–111)
Creatinine, Ser: 1.14 mg/dL (ref 0.61–1.24)
GFR, Estimated: >60 mL/min (ref >60)
Glucose, Bld: 111 mg/dL — ABNORMAL HIGH (ref 70–99)
Potassium: 3.8 mmol/L (ref 3.5–5.1)
Sodium: 139 mmol/L (ref 135–145)

## 2022-03-10 LAB — POCT I-STAT 7, (LYTES, BLD GAS, ICA,H+H)
Acid-Base Excess: 5 mmol/L — ABNORMAL HIGH (ref 0.0–2.0)
Bicarbonate: 31.2 mmol/L — ABNORMAL HIGH (ref 20.0–28.0)
Calcium, Ion: 1.09 mmol/L — ABNORMAL LOW (ref 1.15–1.40)
HCT: 37 % — ABNORMAL LOW (ref 39.0–52.0)
Hemoglobin: 12.6 g/dL — ABNORMAL LOW (ref 13.0–17.0)
O2 Saturation: 100 %
Patient temperature: 98.2
Potassium: 4.1 mmol/L (ref 3.5–5.1)
Sodium: 137 mmol/L (ref 135–145)
TCO2: 33 mmol/L — ABNORMAL HIGH (ref 22–32)
pCO2 arterial: 52.5 mmHg — ABNORMAL HIGH (ref 32–48)
pH, Arterial: 7.381 (ref 7.35–7.45)
pO2, Arterial: 484 mmHg — ABNORMAL HIGH (ref 83–108)

## 2022-03-10 LAB — PHOSPHORUS: Phosphorus: 2.5 mg/dL (ref 2.5–4.6)

## 2022-03-10 LAB — GLUCOSE, CAPILLARY
Glucose-Capillary: 100 mg/dL — ABNORMAL HIGH (ref 70–99)
Glucose-Capillary: 99 mg/dL (ref 70–99)
Glucose-Capillary: 127 mg/dL — ABNORMAL HIGH (ref 70–99)
Glucose-Capillary: 142 mg/dL — ABNORMAL HIGH (ref 70–99)
Glucose-Capillary: 193 mg/dL — ABNORMAL HIGH (ref 70–99)
Glucose-Capillary: 240 mg/dL — ABNORMAL HIGH (ref 70–99)

## 2022-03-10 LAB — CBC
HCT: 39.7 % (ref 39.0–52.0)
Hemoglobin: 13.1 g/dL (ref 13.0–17.0)
MCH: 32 pg (ref 26.0–34.0)
MCHC: 33 g/dL (ref 30.0–36.0)
MCV: 97.1 fL (ref 80.0–100.0)
Platelets: 263 10*3/uL (ref 150–400)
RBC: 4.09 MIL/uL — ABNORMAL LOW (ref 4.22–5.81)
RDW: 13.1 % (ref 11.5–15.5)
WBC: 11.1 10*3/uL — ABNORMAL HIGH (ref 4.0–10.5)
nRBC: 0 % (ref 0.0–0.2)

## 2022-03-10 LAB — STREP PNEUMONIAE URINARY ANTIGEN: Strep Pneumo Urinary Antigen: NEGATIVE

## 2022-03-10 LAB — MAGNESIUM
Magnesium: 2.2 mg/dL (ref 1.7–2.4)
Magnesium: 2.1 mg/dL (ref 1.7–2.4)

## 2022-03-10 SURGERY — VIDEO BRONCHOSCOPY WITHOUT FLUORO
Anesthesia: General

## 2022-03-10 MED ORDER — LIDOCAINE HCL URETHRAL/MUCOSAL 2 % EX GEL: 1.0000 "application " | Freq: Once | CUTANEOUS | Status: DC

## 2022-03-10 MED ORDER — MIRTAZAPINE 30 MG PO TBDP
45.0000 mg | ORAL_TABLET | Freq: Every day | ORAL | Status: DC
  Administered 2022-03-10: 45 mg
  Filled 2022-03-10 (×2): qty 1

## 2022-03-10 MED ORDER — METHYLPREDNISOLONE SODIUM SUCC 125 MG IJ SOLR
60.0000 mg | Freq: Once | INTRAMUSCULAR | Status: AC
  Administered 2022-03-10: 60 mg via INTRAVENOUS
  Filled 2022-03-10: qty 2

## 2022-03-10 MED ORDER — ORAL CARE MOUTH RINSE
15.0000 mL | OROMUCOSAL | Status: DC
  Administered 2022-03-10 – 2022-03-11 (×9): 15 mL via OROMUCOSAL

## 2022-03-10 MED ORDER — ALBUTEROL SULFATE (2.5 MG/3ML) 0.083% IN NEBU
INHALATION_SOLUTION | RESPIRATORY_TRACT | Status: AC
  Filled 2022-03-10: qty 3

## 2022-03-10 MED ORDER — PHENYLEPHRINE HCL-NACL 20-0.9 MG/250ML-% IV SOLN
25.0000 ug/min | INTRAVENOUS | Status: DC
  Administered 2022-03-10: 110 ug/min via INTRAVENOUS
  Administered 2022-03-10: 100 ug/min via INTRAVENOUS
  Administered 2022-03-10: 80 ug/min via INTRAVENOUS
  Administered 2022-03-11 (×3): 120 ug/min via INTRAVENOUS
  Administered 2022-03-11: 70 ug/min via INTRAVENOUS
  Filled 2022-03-10 (×7): qty 250

## 2022-03-10 MED ORDER — FENTANYL CITRATE (PF) 100 MCG/2ML IJ SOLN
INTRAMUSCULAR | Status: DC | PRN
  Administered 2022-03-10: 100 ug via INTRAVENOUS

## 2022-03-10 MED ORDER — PROSOURCE TF PO LIQD
45.0000 mL | Freq: Two times a day (BID) | ORAL | Status: DC
  Administered 2022-03-10: 45 mL
  Filled 2022-03-10: qty 45

## 2022-03-10 MED ORDER — DEXAMETHASONE SODIUM PHOSPHATE 10 MG/ML IJ SOLN
INTRAMUSCULAR | Status: DC | PRN
  Administered 2022-03-10: 4 mg via INTRAVENOUS

## 2022-03-10 MED ORDER — PROPOFOL 10 MG/ML IV BOLUS
INTRAVENOUS | Status: DC | PRN
  Administered 2022-03-10 (×2): 100 mg via INTRAVENOUS

## 2022-03-10 MED ORDER — ADULT MULTIVITAMIN W/MINERALS CH
1.0000 | ORAL_TABLET | Freq: Every day | ORAL | Status: DC
  Administered 2022-03-11: 1
  Filled 2022-03-10: qty 1

## 2022-03-10 MED ORDER — ROCURONIUM BROMIDE 10 MG/ML (PF) SYRINGE
PREFILLED_SYRINGE | INTRAVENOUS | Status: DC | PRN
  Administered 2022-03-10: 40 mg via INTRAVENOUS

## 2022-03-10 MED ORDER — ONDANSETRON HCL 4 MG/2ML IJ SOLN
INTRAMUSCULAR | Status: DC | PRN
  Administered 2022-03-10: 4 mg via INTRAVENOUS

## 2022-03-10 MED ORDER — IPRATROPIUM-ALBUTEROL 0.5-2.5 (3) MG/3ML IN SOLN
3.0000 mL | Freq: Four times a day (QID) | RESPIRATORY_TRACT | Status: DC
  Administered 2022-03-10 – 2022-03-14 (×13): 3 mL via RESPIRATORY_TRACT
  Filled 2022-03-10 (×16): qty 3

## 2022-03-10 MED ORDER — PHENYLEPHRINE 80 MCG/ML (10ML) SYRINGE FOR IV PUSH (FOR BLOOD PRESSURE SUPPORT)
PREFILLED_SYRINGE | INTRAVENOUS | Status: DC | PRN
  Administered 2022-03-10: 80 ug via INTRAVENOUS

## 2022-03-10 MED ORDER — PROPOFOL 1000 MG/100ML IV EMUL
5.0000 ug/kg/min | INTRAVENOUS | Status: DC
  Administered 2022-03-10: 50 ug/kg/min via INTRAVENOUS
  Filled 2022-03-10: qty 100

## 2022-03-10 MED ORDER — HYDROCODONE BIT-HOMATROP MBR 5-1.5 MG/5ML PO SOLN
5.0000 mL | Freq: Three times a day (TID) | ORAL | Status: DC
  Administered 2022-03-11: 5 mL
  Filled 2022-03-10: qty 5

## 2022-03-10 MED ORDER — LACTATED RINGERS IV SOLN: INTRAVENOUS | Status: DC

## 2022-03-10 MED ORDER — SUGAMMADEX SODIUM 200 MG/2ML IV SOLN
INTRAVENOUS | Status: DC | PRN
  Administered 2022-03-10: 150 mg via INTRAVENOUS

## 2022-03-10 MED ORDER — FAMOTIDINE 20 MG PO TABS
20.0000 mg | ORAL_TABLET | Freq: Two times a day (BID) | ORAL | Status: DC
  Administered 2022-03-10 – 2022-03-11 (×2): 20 mg
  Filled 2022-03-10 (×2): qty 1

## 2022-03-10 MED ORDER — POLYETHYLENE GLYCOL 3350 17 G PO PACK
17.0000 g | PACK | Freq: Every day | ORAL | Status: DC
  Administered 2022-03-11: 17 g
  Filled 2022-03-10: qty 1

## 2022-03-10 MED ORDER — FENTANYL BOLUS VIA INFUSION
50.0000 ug | INTRAVENOUS | Status: DC | PRN
  Administered 2022-03-10: 25 ug via INTRAVENOUS
  Administered 2022-03-11: 50 ug via INTRAVENOUS
  Filled 2022-03-10: qty 100

## 2022-03-10 MED ORDER — VITAL AF 1.2 CAL PO LIQD
1000.0000 mL | ORAL | Status: DC
  Administered 2022-03-10: 1000 mL

## 2022-03-10 MED ORDER — ETHAMBUTOL HCL 400 MG PO TABS
1000.0000 mg | ORAL_TABLET | Freq: Every morning | ORAL | Status: DC
  Administered 2022-03-11: 1000 mg
  Filled 2022-03-10: qty 2

## 2022-03-10 MED ORDER — SODIUM CHLORIDE 0.9 % IV SOLN
250.0000 mL | INTRAVENOUS | Status: DC
  Administered 2022-03-10 (×2): 250 mL via INTRAVENOUS

## 2022-03-10 MED ORDER — LIDOCAINE 2% (20 MG/ML) 5 ML SYRINGE
INTRAMUSCULAR | Status: DC | PRN
  Administered 2022-03-10: 60 mg via INTRAVENOUS

## 2022-03-10 MED ORDER — PROPOFOL 500 MG/50ML IV EMUL
INTRAVENOUS | Status: DC | PRN
  Administered 2022-03-10: 75 ug/kg/min via INTRAVENOUS

## 2022-03-10 MED ORDER — PREDNISONE 10 MG PO TABS
10.0000 mg | ORAL_TABLET | Freq: Every day | ORAL | Status: DC
  Administered 2022-03-11: 10 mg
  Filled 2022-03-10: qty 1

## 2022-03-10 MED ORDER — PHENYLEPHRINE HCL-NACL 20-0.9 MG/250ML-% IV SOLN
INTRAVENOUS | Status: DC | PRN
  Administered 2022-03-10: 50 ug/min via INTRAVENOUS

## 2022-03-10 MED ORDER — SUCCINYLCHOLINE CHLORIDE 200 MG/10ML IV SOSY
PREFILLED_SYRINGE | INTRAVENOUS | Status: DC | PRN
  Administered 2022-03-10 (×2): 60 mg via INTRAVENOUS

## 2022-03-10 MED ORDER — AZITHROMYCIN 500 MG PO TABS
500.0000 mg | ORAL_TABLET | Freq: Every day | ORAL | Status: DC
  Administered 2022-03-11: 500 mg
  Filled 2022-03-10: qty 1

## 2022-03-10 MED ORDER — FENTANYL 2500MCG IN NS 250ML (10MCG/ML) PREMIX INFUSION
0.0000 ug/h | INTRAVENOUS | Status: DC
  Administered 2022-03-10 – 2022-03-11 (×2): 100 ug/h via INTRAVENOUS
  Filled 2022-03-10 (×2): qty 250

## 2022-03-10 MED ORDER — METHYLPREDNISOLONE SODIUM SUCC 125 MG IJ SOLR
INTRAMUSCULAR | Status: AC
  Filled 2022-03-10: qty 2

## 2022-03-10 MED ORDER — LORAZEPAM 2 MG/ML IJ SOLN
2.0000 mg | Freq: Once | INTRAMUSCULAR | Status: AC
  Administered 2022-03-10: 2 mg via INTRAVENOUS
  Filled 2022-03-10: qty 1

## 2022-03-10 MED ORDER — LEVOTHYROXINE SODIUM 100 MCG PO TABS
100.0000 ug | ORAL_TABLET | Freq: Every day | ORAL | Status: DC
  Administered 2022-03-11: 100 ug
  Filled 2022-03-10: qty 1

## 2022-03-10 MED ORDER — ALBUMIN HUMAN 5 % IV SOLN: INTRAVENOUS | Status: DC | PRN

## 2022-03-10 MED ORDER — ISAVUCONAZONIUM SULFATE 186 MG PO CAPS
372.0000 mg | ORAL_CAPSULE | Freq: Every evening | ORAL | Status: DC
  Administered 2022-03-11: 372 mg
  Filled 2022-03-10 (×4): qty 2

## 2022-03-10 MED ORDER — LACTATED RINGERS IV SOLN
INTRAVENOUS | Status: AC | PRN
Start: 2022-03-10 — End: 2022-03-10
  Administered 2022-03-10: 20 mL/h via INTRAVENOUS

## 2022-03-10 MED ORDER — PROSOURCE TF PO LIQD
45.0000 mL | Freq: Every day | ORAL | Status: DC
  Administered 2022-03-11: 45 mL
  Filled 2022-03-10: qty 45

## 2022-03-10 MED ORDER — FENTANYL CITRATE (PF) 100 MCG/2ML IJ SOLN
INTRAMUSCULAR | Status: AC
  Filled 2022-03-10: qty 2

## 2022-03-10 MED ORDER — DOCUSATE SODIUM 50 MG/5ML PO LIQD
100.0000 mg | Freq: Two times a day (BID) | ORAL | Status: DC
  Administered 2022-03-10 – 2022-03-11 (×2): 100 mg
  Filled 2022-03-10 (×2): qty 10

## 2022-03-10 MED ORDER — VITAL HIGH PROTEIN PO LIQD
1000.0000 mL | ORAL | Status: DC
  Administered 2022-03-10: 1000 mL

## 2022-03-10 NOTE — Anesthesia Procedure Notes (Signed)
Procedure Name: Intubation ?Date/Time: 03/10/2022 12:05 PM ?Performed by: Renato Shin, CRNA ?Pre-anesthesia Checklist: Patient identified, Emergency Drugs available, Suction available and Patient being monitored ?Patient Re-evaluated:Patient Re-evaluated prior to induction ?Oxygen Delivery Method: Circle system utilized ?Preoxygenation: Pre-oxygenation with 100% oxygen ?Induction Type: IV induction ?Ventilation: Mask ventilation without difficulty ?Laryngoscope Size: Sabra Heck and 3 ?Grade View: Grade I ?Tube type: Oral ?Tube size: 8.0 mm ?Number of attempts: 1 ?Airway Equipment and Method: Stylet and Oral airway ?Placement Confirmation: ETT inserted through vocal cords under direct vision, positive ETCO2 and breath sounds checked- equal and bilateral ?Secured at: 22 cm ?Tube secured with: Tape ?Dental Injury: Teeth and Oropharynx as per pre-operative assessment  ? ? ? ? ?

## 2022-03-10 NOTE — Anesthesia Procedure Notes (Signed)
Procedure Name: Intubation ?Date/Time: 03/10/2022 11:33 AM ?Performed by: Renato Shin, CRNA ?Pre-anesthesia Checklist: Patient identified, Emergency Drugs available, Suction available and Patient being monitored ?Patient Re-evaluated:Patient Re-evaluated prior to induction ?Oxygen Delivery Method: Circle system utilized ?Preoxygenation: Pre-oxygenation with 100% oxygen ?Induction Type: IV induction and Rapid sequence ?Laryngoscope Size: Sabra Heck and 3 ?Grade View: Grade I ?Tube type: Oral ?Tube size: 8.0 mm ?Number of attempts: 1 ?Airway Equipment and Method: Stylet and Oral airway ?Placement Confirmation: ETT inserted through vocal cords under direct vision, positive ETCO2 and breath sounds checked- equal and bilateral ?Secured at: 21 cm ?Tube secured with: Tape ?Dental Injury: Teeth and Oropharynx as per pre-operative assessment  ? ? ? ? ?

## 2022-03-10 NOTE — Interval H&P Note (Signed)
History and Physical Interval Note: ? ?03/10/2022 ?11:26 AM ? ?DERK DOUBEK  has presented today for surgery, with the diagnosis of hemoptysis.  The various methods of treatment have been discussed with the patient and family. After consideration of risks, benefits and other options for treatment, the patient has consented to  Procedure(s): ?VIDEO BRONCHOSCOPY WITHOUT FLUORO (N/A) as a surgical intervention.  The patient's history has been reviewed, patient examined, no change in status, stable for surgery.  I have reviewed the patient's chart and labs.  Questions were answered to the patient's satisfaction.   ? ? ?Hollace Michelli A Hoby Kawai ? ? ?

## 2022-03-10 NOTE — Progress Notes (Signed)
Following conclusion of the procedure patient was extubated ? ?Appears to have a bronchospastic episode ? ?Some new blood was being suctioned ? ?With poor mental status, patient unable to protect his airway decision was made to reintubate ? ?Remained hemodynamically stable throughout  monitoring and reintubation ?Saturations remained in the high 90s ?

## 2022-03-10 NOTE — Anesthesia Postprocedure Evaluation (Signed)
Anesthesia Post Note ? ?Patient: [name] ? ?Procedure(s) Performed: VIDEO BRONCHOSCOPY WITHOUT FLUORO ?BRONCHIAL WASHINGS ? ?  ? ?Patient location during evaluation: ICU ?Anesthesia Type: General ?Level of consciousness: sedated ?Pain management: pain level controlled ?Vital Signs Assessment: post-procedure vital signs reviewed and stable ?Respiratory status: patient remains intubated per anesthesia plan ?Cardiovascular status: stable ?Postop Assessment: no apparent nausea or vomiting ?Anesthetic complications: no ? ? ?No notable events documented. ? ?Last Vitals:  ?Vitals:  ? 03/10/22 1530 03/10/22 1545  ?BP: 102/79 105/79  ?Pulse: (!) 46 (!) 45  ?Resp: 18 16  ?Temp:    ?SpO2: 100% 100%  ?  ?Last Pain:  ?Vitals:  ? 03/10/22 1525  ?TempSrc: Oral  ?PainSc:   ? ? ?  ?  ?  ?  ?  ?  ? ?[name] [name] [name] ? ? ? ? ?

## 2022-03-10 NOTE — Progress Notes (Addendum)
? ?NAME:  SPIRO AUSBORN, MRN:  [id number], DOB:  1976/08/21, LOS: 1 ?ADMISSION DATE:  03/08/2022, CONSULTATION DATE:  03/10/2022 ? ?REFERRING MD:  [name], EDP CHIEF COMPLAINT:  hemoptysis  ? 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 He reports trace hemoptysis for many years but developed sudden onset coughing up frank blood this morning with increased shortness of breath.  Denies preceding fevers, URI symptoms or sick contacts.  He has been compliant with his medications.  He was brought in by EMS, saturation 99% on 5 L nasal cannula, 95% on room air ?Chest x-ray showed bilateral upper lobe airspace opacities right more than left, CBC showed leukocytosis with left shift. ? 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 from [place] ?Chronic pulmonary emboli -not on anticoagulation due to hemoptysis ? ?Osteoporosis ?Chronic hep B ? ?05/2014 >Spiro fev1 1.7L/40%, ratio 55  ? ?Per 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  ?2016 Hospitalzed with hemoptysis >IR guided embolization . FOB/BAL + Saccharomyces on fungal fx . Serum Galactomannan was neg.  ?[name] evaluation 04/2017 for  Transplant -rejected. Felt to early for transplant - recommended pre-transplant weight goal 128 lbs for a BMI > 19 ? ?Significant Hospital Events: ?Including procedures, antibiotic start and stop dates in addition to other pertinent events   ?4/20 Admit with hemoptysis, 25-50 cc frank hemoptysis  ?4/21 Estimated 25 ml hemoptysis  ? ?Interim History / Subjective:  ?Feels a little bit better this morning ?Minimal altered blood being suctioned ? ?Objective   ?Blood pressure 90/70, pulse 83, temperature 98.1 ?F (36.7 ?C), temperature source Oral, resp. rate 20, height _0  (1.727 m), weight 56.7 kg, SpO2 100 %. ?   ?FiO2 (%):  [28 %] 28 %  ? ?Intake/Output Summary (Last 24 hours) at 03/10/2022 0909 ?Last data filed at 03/10/2022 0800 ?Gross per 24 hour  ?Intake 1484.02 ml  ?Output 2670 ml  ?Net -1185.98 ml  ? ?Filed Weights  ? 03/08/22 1158  ?Weight: 56.7 kg  ? ? ?Examination: ?General: Underweight, does not appear to be in distress ?HEENT: Moist oral mucosa  ?neuro: Alert and oriented with no focal findings ?CV: s1s2 RRR, no m/r/g ?PULM: Does not appear to be labored, some rhonchi  ?GI: Bowel sounds appreciated ?Extremities: Skin remains warm and dry ?Skin: multiple tattoos, "sun spots" on forearms (pt reports from being on antifungals) ? ? ?CT scan reviewed by myself ? ?Resolved Hospital Problem list   ? ? ?Assessment & Plan:  ? ?Severe hemoptysis ?-Necessitating patient to be transferred to the intensive care unit during the night ?-Breathing has stabilized a bit ? ?History of ABPA ?Bronchiectasis ?Bullous emphysema ?4 x 8 cm masslike area in the posterior aspect of her left upper lobe ?He is chronically on steroid and antifungal-on Cresemba, ethambutol, prednisone ?-  Opportunistic infection may be a possibility ? ?At present continue antibiotics for community-acquired pneumonia ? ?Continue Pulmicort, Brovana, DuoNeb ? ?Continue home prednisone. ? ?Will plan for bronchoscopy with washings right upper lobe ? ?If stable  post bronchoscopy, will transfer out of the unit ? ?For his hemoptysis we will continue tranexamic acid ? ?May need bronchial artery embolization if massive hemoptysis ? ?Best Practice (right click and "Reselect all SmartList Selections" daily)  ?Not on anticoagulation ? ? ?  ?  ?[name], MD ?[facility] ?Pager: See Amion ? ? ? ? ?

## 2022-03-10 NOTE — Procedures (Signed)
History: 46 year old male being evaluated for altered mental status ? ?Sedation: None ? ?Technique: This stat EEG was acquired with electrodes placed according to the International 10-20 electrode system (including Fp1, Fp2, F3, F4, C3, C4, P3, P4, O1, O2, T3, T4, T5, T6, A1, A2, Fz, Cz, Pz). The following electrodes were missing or displaced: none. ? ? ?Background: The background consists of intermixed alpha and beta activities. There is a well defined posterior dominant rhythm of 8-9 Hz that attenuates with eye opening.  With drowsiness, there is an anterior shifting of the posterior dominant rhythm.  Sleep is recorded with normal appearing structures.  ? ?Photic stimulation: Physiologic driving is not performed ? ?EEG Abnormalities: None ? ?Clinical Interpretation: This normal EEG is recorded in the waking and sleep state. There was no seizure or seizure predisposition recorded on this study. Please note that lack of epileptiform activity on EEG does not preclude the possibility of epilepsy.  ? ?Roland Rack, MD ?[facility] ?[phone] ? ?If 7pm- 7am, please page neurology on call as listed in [place]. ?

## 2022-03-10 NOTE — Progress Notes (Signed)
VASCULAR LAB ? ? ? ?Bilateral lower extremity venous duplex has been performed. ? ?See CV proc for preliminary results. ? ? ?Sharion Dove, RVT ?03/10/2022, 6:04 PM ? ?

## 2022-03-10 NOTE — Progress Notes (Signed)
Nutrition Follow-up ? ?DOCUMENTATION CODES:  ? ?Severe malnutrition in context of chronic illness ? ?INTERVENTION:  ? ?Change to Vital 1.2_0 /hr + ProSource TF 67m daily via tube  ? ?Propofol: 17.0 ml/hr- provides 449kcal/day ? ?Free water flushes 335mq4 hours to maintain tube patency  ? ?Regimen provides 1929kcal/day, 101g/day protein and 115364may of free water   ? ?Pt at high refeed risk; recommend monitor potassium, magnesium and phosphorus labs daily until stable ? ?NUTRITION DIAGNOSIS:  ? ?Severe Malnutrition related to chronic illness (MAC infection) as evidenced by severe muscle depletion, severe fat depletion. ?-ongoing ? ?GOAL:  ? ?Provide needs based on ASPEN/SCCM guidelines ?-met  ? ?MONITOR:  ? ?Vent status, Labs, Weight trends, TF tolerance, Skin, I & O's ? ?ASSESSMENT:  ? ?[age]ar old smoker with history of GERD, hypothyroidism, iron deficiency and ABPA/emphysema, MAC infection, left aspergilloma causing recurrent hemoptysis s/p left upper lobectomy who is admitted with hemoptysis. ? ?RD working remotely. ? ?Pt sedated and intubated after a rapid response yesterday. Pt s/p bronchoscopy today. OGT in place. Tube feed protocol started and pt is tolerating well. No new weight since admission.  ? ?Medications reviewed and include: azithromycin, pepcid, synthroid, remeron, prednisone, LRS _1 /hr, neo-synephrine, zosyn, propofol  ?  ?Labs reviewed: K 4.1 wnl, Mg 2.2 wnl ?Wbc- 11.1(H) ? ?Patient is currently intubated on ventilator support ?MV: 9.3 L/min ?Temp (24hrs), Avg:98.5 ?F (36.9 ?C), Min:97.7 ?F (36.5 ?C), Max:100.5 ?F (38.1 ?C) ? ?Propofol: 17.0 ml/hr- provides 449kcal/day   ? ?MAP- >106m58m ? ?UOP- 2670ml51m?Diet Order:   ?Diet Order   ? ? None  ? ?  ? ?EDUCATION NEEDS:  ? ?No education needs have been identified at this time ? ?Skin:  Skin Assessment: Reviewed RN Assessment ? ?Last BM:  4/19 ? ?Height:  ? ?Ht Readings from Last 1 Encounters:  ?03/08/22 _2  (1.727 m)  ? ? ?Weight:  ? ?Wt  Readings from Last 1 Encounters:  ?03/08/22 56.7 kg  ? ? ?Ideal Body Weight:  70 kg ? ?BMI:  Body mass index is 19.01 kg/m?. ? ?Estimated Nutritional Needs:  ? ?Kcal:  1801kcal/day ? ?Protein:  90-105 grams ? ?Fluid:  1.7-2.0L/day ? ?CaseyKoleen DistanceRD, LDN ?Please refer to AMION for RD and/or RD on-call/weekend/after hours pager ? ?

## 2022-03-10 NOTE — Progress Notes (Signed)
Patient will be going to 2 M on vent ? ?We will continue close monitoring, tranexamic acid will be continued ? ?Bronchodilators, steroids ? ?Will continue to monitor closely ? ?

## 2022-03-10 NOTE — Progress Notes (Addendum)
eLink Physician-Brief Progress Note ?Patient Name: [name] ?DOB: 04-02-1976 ?MRN: [id number] ? ? ?Date of Service ? 03/10/2022  ?HPI/Events of Note ? RN is Requesting PRN Fentanyl bolus from the bag  ?eICU Interventions ? PRN pain control ordered   ? ? ? ?Intervention Category ?Minor Interventions: Routine modifications to care plan (e.g. PRN medications for pain, fever) ? ?Korrina Zern G Yolanda Dockendorf ?03/10/2022, 9:46 PM ? ?Addendum at 12:10 am ?Hyperglycemia x 2  ?On steroids and tube feeds ?Added SSI and hypoglycemia protocol as well  ?

## 2022-03-10 NOTE — Progress Notes (Signed)
eLink Physician-Brief Progress Note ?Patient Name: JIA MOHAMED ?DOB: 1976/11/18 ?MRN: [id number] ? ? ?Date of Service ? 03/10/2022  ?HPI/Events of Note ? Patient needs AM labs.  ?eICU Interventions ? BMP, Mg+ ordered.  ? ? ? ?  ? ?Kerry Kass Waymond Meador ?03/10/2022, 12:59 AM ?

## 2022-03-10 NOTE — Progress Notes (Signed)
STAT EEG complete - results pending. ? ?

## 2022-03-10 NOTE — H&P (View-Only) (Signed)
? ?NAME:  [name], MRN:  [id number], DOB:  12/30/1975, LOS: 1 ?ADMISSION DATE:  03/08/2022, CONSULTATION DATE:  03/10/2022 ? ?REFERRING MD:  Dykstra, EDP CHIEF COMPLAINT:  hemoptysis  ? 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 He reports trace hemoptysis for many years but developed sudden onset coughing up frank blood this morning with increased shortness of breath.  Denies preceding fevers, URI symptoms or sick contacts.  He has been compliant with his medications.  He was brought in by EMS, saturation 99% on 5 L nasal cannula, 95% on room air ?Chest x-ray showed bilateral upper lobe airspace opacities right more than left, CBC showed leukocytosis with left shift. ? 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 Vandam from [place] ?Chronic pulmonary emboli -not on anticoagulation due to hemoptysis ? ?Osteoporosis ?Chronic hep B ? ?05/2014 >Spiro fev1 1.7L/40%, ratio 55  ? ?Per Dr. Ramaswamy evaluation 04/2021 ?2010 Dx with MAI >Azithro/Ethambutol ?2012 Dx with Aspergilloma >VATS >started on Voriconazaol  ?2012 Dx w/ ABPA w/ High IgE >4000>started on prednisone  ?2013 tried off azithr/Etham but developed fever, Cx neg but restarted on rx.  ?2016 Hospitalzed with hemoptysis >IR guided embolization . FOB/BAL + Saccharomyces on fungal fx . Serum Galactomannan was neg.  ?[name] evaluation 04/2017 for  Transplant -rejected. Felt to early for transplant - recommended pre-transplant weight goal 128 lbs for a BMI > 19 ? ?Significant Hospital Events: ?Including procedures, antibiotic start and stop dates in addition to other pertinent events   ?4/20 Admit with hemoptysis, 25-50 cc frank hemoptysis  ?4/21 Estimated 25 ml hemoptysis  ? ?Interim History / Subjective:  ?Feels a little bit better this morning ?Minimal altered blood being suctioned ? ?Objective   ?Blood pressure 90/70, pulse 83, temperature 98.1 ?F (36.7 ?C), temperature source Oral, resp. rate 20, height 5' 8" (1.727 m), weight 56.7 kg, SpO2 100 %. ?   ?FiO2 (%):  [28 %] 28 %  ? ?Intake/Output Summary (Last 24 hours) at 03/10/2022 0909 ?Last data filed at 03/10/2022 0800 ?Gross per 24 hour  ?Intake 1484.02 ml  ?Output 2670 ml  ?Net -1185.98 ml  ? ?Filed Weights  ? 03/08/22 1158  ?Weight: 56.7 kg  ? ? ?Examination: ?General: Underweight, does not appear to be in distress ?HEENT: Moist oral mucosa  ?neuro: Alert and oriented with no focal findings ?CV: s1s2 RRR, no m/r/g ?PULM: Does not appear to be labored, some rhonchi  ?GI: Bowel sounds appreciated ?Extremities: Skin remains warm and dry ?Skin: multiple tattoos, "sun spots" on forearms (pt reports from being on antifungals) ? ? ?CT scan reviewed by myself ? ?Resolved Hospital Problem list   ? ? ?Assessment & Plan:  ? ?Severe hemoptysis ?-Necessitating patient to be transferred to the intensive care unit during the night ?-Breathing has stabilized a bit ? ?History of ABPA ?Bronchiectasis ?Bullous emphysema ?4 x 8 cm masslike area in the posterior aspect of her left upper lobe ?He is chronically on steroid and antifungal-on Cresemba, ethambutol, prednisone ?-  Opportunistic infection may be a possibility ? ?At present continue antibiotics for community-acquired pneumonia ? ?Continue Pulmicort, Brovana, DuoNeb ? ?Continue home prednisone. ? ?Will plan for bronchoscopy with washings right upper lobe ? ?If stable  post bronchoscopy, will transfer out of the unit ? ?For his hemoptysis we will continue tranexamic acid ? ?May need bronchial artery embolization if massive hemoptysis ? ?Best Practice (right click and "Reselect all SmartList Selections" daily)  ?Not on anticoagulation ? ? ?  ?  ?Jahkeem Kurka, MD ?[facility] ?Pager: See Amion ? ? ? ? ?

## 2022-03-10 NOTE — Progress Notes (Addendum)
An USGPIV (ultrasound guided PIV) has been placed for short-term vasopressor infusion. A correctly placed ivWatch must be used when administering Vasopressors. Should this treatment be needed beyond 72 hours, central line access should be obtained.  It will be the responsibility of the bedside nurse to follow best practice to prevent extravasations.  ?Lilia Pro RN aware of placement via securechat, Ryan  RN CN at bedside also aware. Hartley placed in [place]. ?

## 2022-03-10 NOTE — Anesthesia Preprocedure Evaluation (Addendum)
Anesthesia Evaluation  ?Patient identified by MRN, date of birth, ID band ?Patient awake ? ? ? ?Reviewed: ?Allergy & Precautions, NPO status , Patient's Chart, lab work & pertinent test results ? ?Airway ?Mallampati: II ? ?TM Distance: >3 FB ?Neck ROM: Full ? ? ? Dental ? ?(+) Lower Dentures, Upper Dentures ?  ?Pulmonary ?shortness of breath, asthma , former smoker,  ?Aspergilloma ?Lung disease, bullous ?MAI (mycobacterium avium-intracellulare)  ?  ?Pulmonary exam normal ? ? ? ? ? ? ? Cardiovascular ?negative cardio ROS ?Normal cardiovascular exam ? ? ?  ?Neuro/Psych ?negative neurological ROS ? negative psych ROS  ? GI/Hepatic ?GERD  ,(+) Hepatitis -, B  ?Endo/Other  ?Hypothyroidism  ? Renal/GU ?negative Renal ROS  ? ?  ?Musculoskeletal ?negative musculoskeletal ROS ?(+)  ? Abdominal ?  ?Peds ? Hematology ?negative hematology ROS ?(+)   ?Anesthesia Other Findings ?hemoptysis ? Reproductive/Obstetrics ? ?  ? ? ? ? ? ? ? ? ? ? ? ? ? ?  ?  ? ? ? ? ? ? ? ?Anesthesia Physical ?Anesthesia Plan ? ?ASA: 3 ? ?Anesthesia Plan: General  ? ?Post-op Pain Management:   ? ?Induction: Intravenous ? ?PONV Risk Score and Plan: 2 and Ondansetron, Dexamethasone and Treatment may vary due to age or medical condition ? ?Airway Management Planned: Oral ETT ? ?Additional Equipment:  ? ?Intra-op Plan:  ? ?Post-operative Plan: Possible Post-op intubation/ventilation ? ?Informed Consent: I have reviewed the patients History and Physical, chart, labs and discussed the procedure including the risks, benefits and alternatives for the proposed anesthesia with the patient or authorized representative who has indicated his/her understanding and acceptance.  ? ? ? ? ? ?Plan Discussed with: CRNA ? ?Anesthesia Plan Comments:   ? ? ? ? ? ? ?Anesthesia Quick Evaluation ? ?

## 2022-03-10 NOTE — Op Note (Signed)
Bronchoscopy Procedure Note ? ?Date of Operation: 03/10/2022 ? ?Pre-op Diagnosis: Hemoptysis ? ?Post-op Diagnosis: Hemoptysis ? ?Surgeon: Laurin Coder ? ?Assistants:  ? ?Anesthesia: General endotracheal anesthesia ? ?Operation: Flexible fiberoptic bronchoscopy, diagnostic bronchoalveolar lavage right upper lobe ? ?Findings: Blood noted right side of lung, following aliquots of saline, no further bleeding noted ? ?Specimen: Lavage right upper lobe with 80 cc of saline ? ?Estimated Blood Loss: less than 50  ? ?Drains: None ? ?Complications: None ? ?Indications and History: ?The patient is a 46 y.o. male with hemoptysis.  The risks, benefits, complications, treatment options and expected outcomes were discussed with the patient.  The possibilities of reaction to medication, pulmonary aspiration, perforation of a viscus, bleeding, failure to diagnose a condition and creating a complication requiring transfusion or operation were discussed with the patient who freely signed the consent.   ? ?Description of Procedure: ?The patient was seen in the Holding Room The patient was taken to , identified as [name] and the procedure verified as Flexible Fiberoptic Bronchoscopy.  A Time Out was held and the above information confirmed.  ? ?Patient was electively intubated for procedure ? ?Bronchoscope was introduced into the endotracheal tube ?Left lung was examined, the right lung was examined ?Significant findings blood in the right main was suctioned effectively, some blood in the lower lobe was suctioned effectively.  Aliquots of saline instilled into the right lower lobe with no fresh bleeding noted ?Bronchoscope was then introduced into the right upper lobe where 80 cc of saline was instilled with a good amount of effluent.  Following reevaluating the airway, procedure was concluded ?Bleeding likely from the right lung, no active bleeding noted ? ?Right upper lobe bronchoalveolar lavage ? ? ? ?Attestation: I  performed the procedure ? ?Persephonie Hegwood A Calianne Larue ?

## 2022-03-10 NOTE — Progress Notes (Signed)
Initial assessment at 7:10 patient was A&Ox4, relaxing in bed, room air and HR in the 70's. No complaints of pain or shortness of breath during bedside report. ? ?RN walked in patient's room at approximately 1930 after patient called out unable to breathe. Upon arrival to room RN found patient coughing up blood and complaining of severe shortness of breath. Charge Nurses were at bedside after HR dropped to 34 on monitor. HR was 120's to 130's when RN entered room. Patient appeared extremely anxious, tachypneic and began to panicking. Patient was in tripod position. Oxygen saturation 86%, [place] and non-rebreather applied. Oral Suction set up and patients throat was suction. Bloody sputum suctioned. Concern for aspiration. Patient continued to cough up small amounts of bloody sputum. Family Medicine arrived shortly. RN received order for 48m IV Zofran, chest x-ray, EKG and CCM was consulted. RN contacted Rapid Response RN who arrived promptly.  ? ?While obtaining EKG prior to CCM arriving patient suddenly became unresponsive, eyes open in an upward gaze, pupils pinpoint but equal. Gag reflex present. Unable to follow any commands and was unresponsive to pain including sternal rub.. Non-purposeful movement in lower extremities. CCM made decision to have patient transferred. Patient and belongings transferred to 2M14 with Rapid Response RN. Bedside report given to AHenrietta Dine RN.  ? ? ? ? ? ?

## 2022-03-10 NOTE — Progress Notes (Signed)
RT NOTE: RT placed patient on ventilator in endoscopy and transported patient to room 9E17 with no complications.  ?

## 2022-03-10 NOTE — Transfer of Care (Signed)
Immediate Anesthesia Transfer of Care Note ? ?Patient: [name] ? ?Procedure(s) Performed: VIDEO BRONCHOSCOPY WITHOUT FLUORO ?BRONCHIAL WASHINGS ? ?Patient Location: PACU ? ?Anesthesia Type:General ? ?Level of Consciousness: Patient remains intubated per anesthesia plan ? ?Airway & Oxygen Therapy: Patient re-intubated, Patient placed on Ventilator (see vital sign flow sheet for setting) and reintubated after bronchospasm after extubation. ? ?Post-op Assessment: Report given to RN and Post -op Vital signs reviewed and stable ? ?Post vital signs: Reviewed and stable ? ?Last Vitals:  ?Vitals Value Taken Time  ?BP    ?Temp    ?Pulse    ?Resp    ?SpO2    ? ? ?Last Pain:  ?Vitals:  ? 03/10/22 1111  ?TempSrc: Temporal  ?PainSc: 0-No pain  ?   ? ?  ? ?Complications: No notable events documented. ?

## 2022-03-10 NOTE — Progress Notes (Signed)
Patient was evaluated ? ?Requiring significant sedation ?On vent, ? ?ABG noted ? ?[place] air entry ?Still been suctioned for some bloody secretions ? ?Chest x-ray reviewed ? ?Hemoptysis ?No significant bleeding noted during bronchoscopy but did have bleeding afterwards ? ?We will continue to monitor closely ?Continue with tranexamic acid ?Will plan to leave intubated overnight and try an wean in a.m. ? ?Sedation medications adjusted for agitation ?Ventilator changes made based off of ABG ?Start tube feeds ?Continue bronchodilators ?Follow cultures from BAL ? ?The patient is critically ill with multiple organ systems failure and requires high complexity decision making for assessment and support, frequent evaluation and titration of therapies, application of advanced monitoring technologies and extensive interpretation of multiple databases. Critical Care Time devoted to patient care services described in this note independent of APP/resident time (if applicable)  is 30 minutes.  ? ?[name] MD ?[facility] ?Personal pager: See Shea Evans ?If unanswered, please page ?CCM On-call: [phone] ? ? ?

## 2022-03-11 DIAGNOSIS — J189 Pneumonia, unspecified organism: Secondary | ICD-10-CM | POA: Diagnosis not present

## 2022-03-11 DIAGNOSIS — R042 Hemoptysis: Secondary | ICD-10-CM | POA: Diagnosis not present

## 2022-03-11 LAB — BASIC METABOLIC PANEL
Anion gap: 7 (ref 5–15)
BUN: 16 mg/dL (ref 6–20)
CO2: 24 mmol/L (ref 22–32)
Calcium: 8 mg/dL — ABNORMAL LOW (ref 8.9–10.3)
Chloride: 107 mmol/L (ref 98–111)
Creatinine, Ser: 0.8 mg/dL (ref 0.61–1.24)
GFR, Estimated: >60 mL/min (ref >60)
Glucose, Bld: 170 mg/dL — ABNORMAL HIGH (ref 70–99)
Potassium: 3.9 mmol/L (ref 3.5–5.1)
Sodium: 138 mmol/L (ref 135–145)

## 2022-03-11 LAB — CBC WITH DIFFERENTIAL/PLATELET
Abs Immature Granulocytes: 0.03 10*3/uL (ref 0.00–0.07)
Basophils Absolute: 0 10*3/uL (ref 0.0–0.1)
Basophils Relative: 0 %
Eosinophils Absolute: 0 10*3/uL (ref 0.0–0.5)
Eosinophils Relative: 0 %
HCT: 38.9 % — ABNORMAL LOW (ref 39.0–52.0)
Hemoglobin: 12.8 g/dL — ABNORMAL LOW (ref 13.0–17.0)
Immature Granulocytes: 0 %
Lymphocytes Relative: 3 %
Lymphs Abs: 0.4 10*3/uL — ABNORMAL LOW (ref 0.7–4.0)
MCH: 32.4 pg (ref 26.0–34.0)
MCHC: 32.9 g/dL (ref 30.0–36.0)
MCV: 98.5 fL (ref 80.0–100.0)
Monocytes Absolute: 0.5 10*3/uL (ref 0.1–1.0)
Monocytes Relative: 5 %
Neutro Abs: 9.7 10*3/uL — ABNORMAL HIGH (ref 1.7–7.7)
Neutrophils Relative %: 92 %
Platelets: 322 10*3/uL (ref 150–400)
RBC: 3.95 MIL/uL — ABNORMAL LOW (ref 4.22–5.81)
RDW: 13.2 % (ref 11.5–15.5)
WBC: 10.5 10*3/uL (ref 4.0–10.5)
nRBC: 0 % (ref 0.0–0.2)

## 2022-03-11 LAB — GLUCOSE, CAPILLARY
Glucose-Capillary: 173 mg/dL — ABNORMAL HIGH (ref 70–99)
Glucose-Capillary: 195 mg/dL — ABNORMAL HIGH (ref 70–99)
Glucose-Capillary: 143 mg/dL — ABNORMAL HIGH (ref 70–99)
Glucose-Capillary: 93 mg/dL (ref 70–99)
Glucose-Capillary: 101 mg/dL — ABNORMAL HIGH (ref 70–99)

## 2022-03-11 LAB — TRIGLYCERIDES: Triglycerides: 49 mg/dL (ref <150)

## 2022-03-11 LAB — MAGNESIUM
Magnesium: 2.1 mg/dL (ref 1.7–2.4)
Magnesium: 2 mg/dL (ref 1.7–2.4)

## 2022-03-11 LAB — PHOSPHORUS
Phosphorus: 2.5 mg/dL (ref 2.5–4.6)
Phosphorus: 2.7 mg/dL (ref 2.5–4.6)

## 2022-03-11 MED ORDER — METHYLPREDNISOLONE SODIUM SUCC 125 MG IJ SOLR: 40.0000 mg | Freq: Two times a day (BID) | INTRAMUSCULAR | Status: DC

## 2022-03-11 MED ORDER — AZITHROMYCIN 500 MG PO TABS
500.0000 mg | ORAL_TABLET | Freq: Every day | ORAL | Status: DC
Start: 2022-03-12 — End: 2022-03-12
  Filled 2022-03-11: qty 1

## 2022-03-11 MED ORDER — PREDNISONE 10 MG PO TABS
10.0000 mg | ORAL_TABLET | Freq: Two times a day (BID) | ORAL | Status: DC
  Administered 2022-03-11: 10 mg
  Filled 2022-03-11: qty 1

## 2022-03-11 MED ORDER — DOCUSATE SODIUM 50 MG/5ML PO LIQD
100.0000 mg | Freq: Two times a day (BID) | ORAL | Status: DC
  Administered 2022-03-11: 100 mg via ORAL
  Filled 2022-03-11 (×2): qty 10

## 2022-03-11 MED ORDER — FAMOTIDINE 20 MG PO TABS
20.0000 mg | ORAL_TABLET | Freq: Two times a day (BID) | ORAL | Status: DC
  Administered 2022-03-11: 20 mg via ORAL
  Filled 2022-03-11: qty 1

## 2022-03-11 MED ORDER — LIDOCAINE HCL 1 % IJ SOLN
5.0000 mL | Freq: Once | INTRAMUSCULAR | Status: AC
  Administered 2022-03-11: 5 mL
  Filled 2022-03-11: qty 5

## 2022-03-11 MED ORDER — LIDOCAINE HCL (PF) 1 % IJ SOLN
INTRAMUSCULAR | Status: AC
  Filled 2022-03-11: qty 5

## 2022-03-11 MED ORDER — DEXTROSE 50 % IV SOLN: 25.0000 g | INTRAVENOUS | Status: AC

## 2022-03-11 MED ORDER — ADULT MULTIVITAMIN W/MINERALS CH
1.0000 | ORAL_TABLET | Freq: Every day | ORAL | Status: DC
  Administered 2022-03-12 – 2022-03-15 (×4): 1 via ORAL
  Filled 2022-03-11 (×4): qty 1

## 2022-03-11 MED ORDER — INSULIN ASPART 100 UNIT/ML IJ SOLN
2.0000 [IU] | INTRAMUSCULAR | Status: DC
  Administered 2022-03-11: 6 [IU] via SUBCUTANEOUS
  Administered 2022-03-11 (×2): 4 [IU] via SUBCUTANEOUS
  Administered 2022-03-11: 2 [IU] via SUBCUTANEOUS
  Administered 2022-03-12: 4 [IU] via SUBCUTANEOUS

## 2022-03-11 MED ORDER — ETHAMBUTOL HCL 400 MG PO TABS
1000.0000 mg | ORAL_TABLET | Freq: Every morning | ORAL | Status: DC
  Administered 2022-03-12 – 2022-03-15 (×4): 1000 mg via ORAL
  Filled 2022-03-11 (×4): qty 2

## 2022-03-11 MED ORDER — MIRTAZAPINE 30 MG PO TBDP
45.0000 mg | ORAL_TABLET | Freq: Every day | ORAL | Status: DC
  Administered 2022-03-11 – 2022-03-14 (×4): 45 mg via ORAL
  Filled 2022-03-11 (×6): qty 1

## 2022-03-11 MED ORDER — HYDROCODONE BIT-HOMATROP MBR 5-1.5 MG/5ML PO SOLN
5.0000 mL | Freq: Three times a day (TID) | ORAL | Status: DC
  Administered 2022-03-11 – 2022-03-15 (×11): 5 mL via ORAL
  Filled 2022-03-11 (×11): qty 5

## 2022-03-11 MED ORDER — ISAVUCONAZONIUM SULFATE 186 MG PO CAPS
372.0000 mg | ORAL_CAPSULE | Freq: Every evening | ORAL | Status: DC
Start: 2022-03-12 — End: 2022-03-15
  Administered 2022-03-12 – 2022-03-14 (×3): 372 mg via ORAL
  Filled 2022-03-11 (×8): qty 2

## 2022-03-11 MED ORDER — LIDOCAINE HCL (CARDIAC) PF 100 MG/5ML IV SOSY
PREFILLED_SYRINGE | INTRAVENOUS | Status: AC
Start: 2022-03-11 — End: 2022-03-11
  Filled 2022-03-11: qty 5

## 2022-03-11 MED ORDER — PREDNISONE 10 MG PO TABS
10.0000 mg | ORAL_TABLET | Freq: Two times a day (BID) | ORAL | Status: DC
  Filled 2022-03-11: qty 1

## 2022-03-11 MED ORDER — MIDAZOLAM HCL 2 MG/2ML IJ SOLN
2.0000 mg | Freq: Once | INTRAMUSCULAR | Status: AC
  Administered 2022-03-11: 2 mg via INTRAVENOUS

## 2022-03-11 MED ORDER — MIDAZOLAM HCL 2 MG/2ML IJ SOLN
INTRAMUSCULAR | Status: AC
  Filled 2022-03-11: qty 2

## 2022-03-11 MED ORDER — LEVOTHYROXINE SODIUM 100 MCG PO TABS
100.0000 ug | ORAL_TABLET | Freq: Every day | ORAL | Status: DC
  Administered 2022-03-12 – 2022-03-15 (×4): 100 ug via ORAL
  Filled 2022-03-11 (×4): qty 1

## 2022-03-11 MED ORDER — POLYETHYLENE GLYCOL 3350 17 G PO PACK
17.0000 g | PACK | Freq: Every day | ORAL | Status: DC
  Filled 2022-03-11 (×2): qty 1

## 2022-03-11 MED ORDER — ORAL CARE MOUTH RINSE
15.0000 mL | Freq: Two times a day (BID) | OROMUCOSAL | Status: DC
  Administered 2022-03-11 – 2022-03-15 (×6): 15 mL via OROMUCOSAL

## 2022-03-11 NOTE — Progress Notes (Signed)
Did check up on patient about 1300 hrs. ? ?Respiratory at about 9-minute ventilation about 5.6 ? ?No increased work of breathing, arousable but goes back to sleep ? ?Continue to monitor ?

## 2022-03-11 NOTE — Progress Notes (Addendum)
? ?NAME:  [name], MRN:  [id number], DOB:  1975-12-21, LOS: 2 ?ADMISSION DATE:  03/08/2022, CONSULTATION DATE:  03/11/2022 ? ?REFERRING MD:  [name], EDP CHIEF COMPLAINT:  hemoptysis  ? 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 He reports trace hemoptysis for many years but developed sudden onset coughing up frank blood this morning with increased shortness of breath.  Denies preceding fevers, URI symptoms or sick contacts.  He has been compliant with his medications.  He was brought in by EMS, saturation 99% on 5 L nasal cannula, 95% on room air ?Chest x-ray showed bilateral upper lobe airspace opacities right more than left, CBC showed leukocytosis with left shift. ? 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 from [place] ?Chronic pulmonary emboli -not on anticoagulation due to hemoptysis ? ?Osteoporosis ?Chronic hep B ? ?05/2014 >Spiro fev1 1.7L/40%, ratio 55  ? ?Per 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  ?2016 Hospitalzed with hemoptysis >IR guided embolization . FOB/BAL + Saccharomyces on fungal fx . Serum Galactomannan was neg.  ?[name] evaluation 04/2017 for  Transplant -rejected. Felt to early for transplant - recommended pre-transplant weight goal 128 lbs for a BMI > 19 ? ?Significant Hospital Events: ?Including procedures, antibiotic start and stop dates in addition to other pertinent events   ?4/20 Admit with hemoptysis, 25-50 cc frank hemoptysis  ?4/21 Estimated 25 ml hemoptysis had bronchoscopy 4/22 ?4/23-had bronchoscopy 4/22, no evidence of active bleeding, did go bronchospastic following procedure and had to be re-intubated ?4/23-repeat bronchoscopy today, some blood in the airway but no evidence of active bleeding, more blood noted on the right side of the lung ? ?Interim History / Subjective:  ?Sedated ? ?Objective   ?Blood pressure 96/67, pulse (!) 55, temperature 98.4 ?F (36.9 ?C), temperature source Axillary, resp. rate 17, height 5' 8" (1.727 m), weight 52.7 kg, SpO2 99 %. ?   ?Vent Mode: PRVC ?FiO2 (%):  [40 %-100 %] 40 % ?Set Rate:  [18 bmp] 18 bmp ?Vt Set:  [540 mL] 540 mL ?PEEP:  [5 cmH20] 5 cmH20 ?Plateau Pressure:  [20 cmH20-26 cmH20] 20 cmH20  ? ?Intake/Output Summary (Last 24 hours) at 03/11/2022 0906 ?Last data filed at 03/11/2022 0700 ?Gross per 24 hour  ?Intake 3799.57 ml  ?Output 1180 ml  ?Net 2619.57 ml  ? ?Filed Weights  ? 03/08/22 1158 03/11/22 0500  ?Weight: 56.7 kg 52.7 kg  ? ? ?Examination: ?General: Underweight, does not appear to be in acute distress ?HEENT: Moist oral mucosa ?neuro: Sedated ?CV: S1-S2 appreciated ?PULM: Some rhonchi, fair air movement ?GI: Bowel sounds appreciated ?Extremities: Skin is warm and dry ?Skin: multiple tattoos, "sun spots" on forearms (pt reports from being on antifungals), mild clubbing ? ? ?CT scan reviewed by myself ? ?Resolved Hospital Problem list   ? ? ?  Assessment & Plan:  ? ?Patient with severe hemoptysis ?Continues to have hemoptysis ?-Bronchoscopy did reveal more blood in right side of the airway compared to left, following suctioning-no active bleeding noted ? ?History of  ABPA ?Bronchiectasis ?Bullous emphysema ?4 x 8 cm masslike area in the posterior aspect of her left upper lobe ?He is chronically on steroid and antifungal-on Cresemba, ethambutol, prednisone ?-Opportunistic infection may be a possibility ? ?Concern for pneumonia ?-On Zosyn, azithromycin ?-Cultures from bronchoscopy 03/10/2022 is not showing any organisms at present ? ?Continue Brovana, DuoNeb ?Hold Pulmicort ? ?Continue home dose prednisone ?-We will increase to twice daily ? ?Continue tranexamic acid for hemoptysis ? ?Did not find specific source for bleeding, bleeding more from the right compared to left from findings with initial visualization of the airway, following suctioning of bloody secretions, there was no evidence of active bleeding ? ?Plan will be to wean and possible extubation if he weans well ? ?Updated patients mother ? ?Best Practice (right click and "Reselect all SmartList Selections" daily)  ?Not on anticoagulation ? ? ?  ?  ?The patient is critically ill with multiple organ systems failure and requires high complexity decision making for assessment and support, frequent evaluation and titration of therapies, application of advanced monitoring technologies and extensive interpretation of multiple databases. Critical Care Time devoted to patient care services described in this note independent of APP/resident time (if applicable)  is 32 minutes.  ? ?[name] MD ?[facility] ?Personal pager: See Shea Evans ?If unanswered, please page ?CCM On-call: [phone] ? ? ? ?

## 2022-03-11 NOTE — Progress Notes (Signed)
RT note. ?RT called to bedside per RN patient self extubated. Upon arrival patient with ETT sticking half way out and sat 98%. ETT removed and patient placed on 7L salter sat 100%. Patient able to speak at this time, no stridor noted at this time. Patient coughing up blood still. RT will continue to monitor.  ?

## 2022-03-11 NOTE — Progress Notes (Signed)
Called to floor to see patient ? ?Self extubated ? ?Awake, alert, interactive ?Fair air movement bilaterally ? ?Stable vital signs heart rate 69, blood pressure 101/73, saturating at 100% ? ? ?We will monitor closely ?

## 2022-03-11 NOTE — Procedures (Addendum)
Bronchoscopy Procedure Note ? ?[name]  ?[id number]  ?11/27/1975 ? ?Date:03/11/22  ?Time:8:59 AM  ? ?Provider Performing:Nicolena Schurman A Alford Gamero  ? ?Procedure(s):  Flexible Bronchoscopy (73419) ? ?Indication(s) ?Hemoptysis ? ?Consent ?Patient consented for procedure ? ?Anesthesia ?2 mg of Versed x1 ?100 mcg of fentanyl x1 ? ? ?Time Out ?Verified patient identification, verified procedure, site/side was marked, verified correct patient position, special equipment/implants available, medications/allergies/relevant history reviewed, required imaging and test results available. ? ? ?Sterile Technique ?Usual hand hygiene, masks and gloves were used ? ? ?Procedure Description ?Bronchoscope advanced through endotracheal tube and into airway.  Airways were examined down to subsegmental level with findings noted below.   ?5 cc of 1% lidocaine instilled into airway ?Following diagnostic evaluation, airway was examined ?Left upper lobe, lingula, lower lobe bronchi well visualized-there was bloody secretions that were suctioned effectively-there was no objective evidence of active bleeding following instilling aliquots of saline to clear blood from the airway ?Bronchoscope introduced into the right-mild bloody secretions noted on the right side, aliquots of saline instilled, following clearing airway of bloody secretions-no evidence of active bleeding noted.  There was bubbling of secretions and airway but no evidence of active bleeding. ? ?Findings: Bleeding is bright likely coming from the right side of the airway however, it is difficult to ascertain whether it is from the right lower lobe as I did not see active bleeding while observing the airway after suctioning ? ? ?Complications/Tolerance ?None; patient tolerated the procedure well. ?Chest X-ray is not needed post procedure. ? ? ?EBL ?Minimal ? ? ?Specimen(s) ?none ? ?

## 2022-03-11 NOTE — Progress Notes (Signed)
RT note. ?Patient on SBT 5/5 40% vt 617 mve 5.3.  ?Per Dr. Jenetta Downer, MD okay with patient mve -5.0, RR 8-10. Patient sat 100%, RT will continue to monitor, RN aware.  ?

## 2022-03-12 DIAGNOSIS — R042 Hemoptysis: Secondary | ICD-10-CM | POA: Diagnosis not present

## 2022-03-12 DIAGNOSIS — J189 Pneumonia, unspecified organism: Secondary | ICD-10-CM | POA: Diagnosis not present

## 2022-03-12 DIAGNOSIS — G9340 Encephalopathy, unspecified: Secondary | ICD-10-CM | POA: Diagnosis not present

## 2022-03-12 DIAGNOSIS — J9601 Acute respiratory failure with hypoxia: Secondary | ICD-10-CM | POA: Diagnosis not present

## 2022-03-12 LAB — BASIC METABOLIC PANEL
Anion gap: 7 (ref 5–15)
BUN: 13 mg/dL (ref 6–20)
CO2: 26 mmol/L (ref 22–32)
Calcium: 8.4 mg/dL — ABNORMAL LOW (ref 8.9–10.3)
Chloride: 109 mmol/L (ref 98–111)
Creatinine, Ser: 0.79 mg/dL (ref 0.61–1.24)
GFR, Estimated: >60 mL/min (ref >60)
Glucose, Bld: 105 mg/dL — ABNORMAL HIGH (ref 70–99)
Potassium: 4.2 mmol/L (ref 3.5–5.1)
Sodium: 142 mmol/L (ref 135–145)

## 2022-03-12 LAB — CBC
HCT: 40.5 % (ref 39.0–52.0)
Hemoglobin: 13.1 g/dL (ref 13.0–17.0)
MCH: 32.6 pg (ref 26.0–34.0)
MCHC: 32.3 g/dL (ref 30.0–36.0)
MCV: 100.7 fL — ABNORMAL HIGH (ref 80.0–100.0)
Platelets: 179 10*3/uL (ref 150–400)
RBC: 4.02 MIL/uL — ABNORMAL LOW (ref 4.22–5.81)
RDW: 13.3 % (ref 11.5–15.5)
WBC: 11.7 10*3/uL — ABNORMAL HIGH (ref 4.0–10.5)
nRBC: 0 % (ref 0.0–0.2)

## 2022-03-12 LAB — MAGNESIUM: Magnesium: 2.1 mg/dL (ref 1.7–2.4)

## 2022-03-12 LAB — CULTURE, RESPIRATORY W GRAM STAIN: Gram Stain: NONE SEEN

## 2022-03-12 LAB — GLUCOSE, CAPILLARY
Glucose-Capillary: 100 mg/dL — ABNORMAL HIGH (ref 70–99)
Glucose-Capillary: 114 mg/dL — ABNORMAL HIGH (ref 70–99)
Glucose-Capillary: 104 mg/dL — ABNORMAL HIGH (ref 70–99)
Glucose-Capillary: 155 mg/dL — ABNORMAL HIGH (ref 70–99)

## 2022-03-12 LAB — CYTOLOGY - NON PAP

## 2022-03-12 MED ORDER — CEPHALEXIN 500 MG PO CAPS
500.0000 mg | ORAL_CAPSULE | Freq: Three times a day (TID) | ORAL | Status: DC
  Administered 2022-03-12 – 2022-03-15 (×9): 500 mg via ORAL
  Filled 2022-03-12 (×12): qty 1

## 2022-03-12 MED ORDER — AMOXICILLIN-POT CLAVULANATE 875-125 MG PO TABS
1.0000 | ORAL_TABLET | Freq: Two times a day (BID) | ORAL | Status: DC
  Administered 2022-03-12: 1 via ORAL
  Filled 2022-03-12: qty 1

## 2022-03-12 MED ORDER — AZITHROMYCIN 250 MG PO TABS
500.0000 mg | ORAL_TABLET | Freq: Every day | ORAL | Status: DC
  Administered 2022-03-12 – 2022-03-15 (×4): 500 mg via ORAL
  Filled 2022-03-12: qty 2
  Filled 2022-03-12 (×2): qty 1
  Filled 2022-03-12: qty 2

## 2022-03-12 MED ORDER — PREDNISONE 10 MG PO TABS
10.0000 mg | ORAL_TABLET | Freq: Every day | ORAL | Status: DC
  Administered 2022-03-12 – 2022-03-15 (×4): 10 mg via ORAL
  Filled 2022-03-12 (×5): qty 1

## 2022-03-12 NOTE — Progress Notes (Addendum)
FPTS Interim Progress Note ? ?Patient with h/o ABPA, MAC, left aspergilloma s/p bronch x2 and intubation with self extubation while in ICU will be transferred back to our service on 03/13/22. Patient is hemodynamically stable on 2L 28% FIO2.  ? ?Bronch showed MSSA: started on Keflex.  ? ?Will pick him back up on our service, FPTS tomorrow 03/13/22 _0 .  ? ?Erskine Emery, MD ?03/12/2022, 1:37 PM ?PGY-1, [place] ?Service pager [phone] ? ?

## 2022-03-12 NOTE — Progress Notes (Signed)
eLink Physician-Brief Progress Note ?Patient Name: DERREL MOORE ?DOB: [birth year] ?MRN: [id number] ? ? ?Date of Service ? 03/12/2022  ?HPI/Events of Note ? Patient was planned to advance diet as tolerated and has had no issue at all with liquids, meds, apple sauce and is hungry. On gentle IV fluids and on nasal o2, fully awake and alert  ?eICU Interventions ? D/w RN and diet ordered  ? ? ? ?Intervention Category ?Major Interventions: Other: ? ?Romani Wilbon G Kimberlin Scheel ?03/12/2022, 3:42 AM ?

## 2022-03-12 NOTE — Progress Notes (Signed)
Patient will be transferred to medical floor ? ?Signed out to on-call family medicine physician ?

## 2022-03-12 NOTE — Progress Notes (Signed)
? ?NAME:  [name], MRN:  [id number], DOB:  December 05, 1975, LOS: 3 ?ADMISSION DATE:  03/08/2022, CONSULTATION DATE:  03/12/2022 ? ?REFERRING MD:  [name], EDP CHIEF COMPLAINT:  hemoptysis  ? 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 He reports trace hemoptysis for many years but developed sudden onset coughing up frank blood this morning with increased shortness of breath.  Denies preceding fevers, URI symptoms or sick contacts.  He has been compliant with his medications.  He was brought in by EMS, saturation 99% on 5 L nasal cannula, 95% on room air ?Chest x-ray showed bilateral upper lobe airspace opacities right more than left, CBC showed leukocytosis with left shift. ? 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 from [place] ?Chronic pulmonary emboli -not on anticoagulation due to hemoptysis ? ?Osteoporosis ?Chronic hep B ? ?05/2014 >Spiro fev1 1.7L/40%, ratio 55  ? ?Per 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  ?2016 Hospitalzed with hemoptysis >IR guided embolization . FOB/BAL + Saccharomyces on fungal fx . Serum Galactomannan was neg.  ?[name] evaluation 04/2017 for  Transplant -rejected. Felt to early for transplant - recommended pre-transplant weight goal 128 lbs for a BMI > 19 ? ?Significant Hospital Events: ?Including procedures, antibiotic start and stop dates in addition to other pertinent events   ?4/20 Admit with hemoptysis, 25-50 cc frank hemoptysis  ?4/21 Estimated 25 ml hemoptysis had bronchoscopy 4/22 ?4/23-had bronchoscopy 4/22, no evidence of active bleeding, did go bronchospastic following procedure and had to be re-intubated ?4/23-repeat bronchoscopy today, some blood in the airway but no evidence of active bleeding, more blood noted on the right side of the lung ?4/24-self extubated 4/23 ? ?Interim History / Subjective:  ?Awake alert interactive ?Denies any significant complaints ?Denies any chest pains or chest discomfort ?Occasional cough, not really bringing up any significant secretions ? ?Objective   ?Blood pressure 119/72, pulse 71, temperature 97.6 ?F (36.4 ?C), resp. rate 16, height 5' 8" (1.727 m), weight 52.7 kg, SpO2 98 %. ?   ?Vent Mode: PSV;CPAP ?FiO2 (%):  [28 %-40 %] 28 % ?PEEP:  [5 cmH20] 5 cmH20 ?Pressure Support:  [5 cmH20] 5 cmH20  ? ?Intake/Output Summary (Last 24 hours) at 03/12/2022 0815 ?Last data filed at 03/12/2022 0600 ?Gross per 24 hour  ?Intake 2238.11 ml  ?Output 700 ml  ?Net 1538.11 ml  ? ?Filed Weights  ? 03/08/22 1158 03/11/22 0500  ?Weight: 56.7 kg 52.7 kg  ? ? ?Examination: ?General: He is on the wait, does not appear to be in acute distress  ?HEENT: Moist oral mucosa  ?neuro: No neurological focality  ?CV: S1-S2 appreciated with no murmur ?PULM: Fair air movement bilaterally ?GI: Bowel sounds appreciated ?Extremities: Skin is warm and dry ?Skin: multiple tattoos, "sun spots" on forearms (pt reports from being  on antifungals), mild clubbing ? ? ?CT scan reviewed by myself ? ?Resolved Hospital Problem list   ? ? ?Assessment & Plan:  ? ?Hemoptysis ?-Bronchoscopy 4/23-no specific focus of bleeding noted, did have more blood noted on  the right side of the lung ?-Bronchoscopy 4/24-blood in the airway but no active bleeding noted ?-Has been on tranexamic acid ?-He is not coughing up any significant blood at present ?-Few staff aureus in BAL ?-Was on Zosyn, will de-escalate and start Augmentin orally ? ?History of ABPA ?Bronchiectasis ?Bullous emphysema ?4 x 8 masslike area in the posterior aspect of the left upper lobe ?-Chronically on steroids and antifungal-on Cresemba, ethambutol, prednisone ?-Possibility of an opportunistic infection ?-Fungal cultures and stains, AFB negative to date from BAL ? ?Concern for pneumonia ?-Discontinue azithromycin after dose today ?-Will discontinue Zosyn ? ?Continue Brovana, DuoNeb ? ?Home dose prednisone ?-Decrease back to 10 mg daily ? ?With no specific source of bleeding noted, will be a difficult call  ? ?Continue Brovana, DuoNeb ?Hold Pulmicort ? ?Continue home dose prednisone ?-We will increase to twice daily ? ?Continue tranexamic acid for hemoptysis ?-We will discontinue after doses today ? ?With no specific focus of bleeding noted ?-Difficult call to subject him to embolization unless he were to have recurrence of bleeding ? ?If remains stable today will transfer out of ICU ? ? ?Best Practice (right click and "Reselect all SmartList Selections" daily)  ?Not on anticoagulation ? ? ?  ? ?[name], MD ?[facility] ?Pager: See Amion ? ? ? ? ? ?

## 2022-03-13 ENCOUNTER — Encounter (HOSPITAL_COMMUNITY): Payer: Self-pay | Admitting: Pulmonary Disease

## 2022-03-13 DIAGNOSIS — J189 Pneumonia, unspecified organism: Secondary | ICD-10-CM | POA: Diagnosis not present

## 2022-03-13 DIAGNOSIS — R042 Hemoptysis: Secondary | ICD-10-CM | POA: Diagnosis not present

## 2022-03-13 DIAGNOSIS — J9601 Acute respiratory failure with hypoxia: Secondary | ICD-10-CM | POA: Diagnosis not present

## 2022-03-13 DIAGNOSIS — G9340 Encephalopathy, unspecified: Secondary | ICD-10-CM | POA: Diagnosis not present

## 2022-03-13 LAB — ACID FAST SMEAR (AFB, MYCOBACTERIA): Acid Fast Smear: NEGATIVE

## 2022-03-13 LAB — CBC
HCT: 41 % (ref 39.0–52.0)
Hemoglobin: 13.3 g/dL (ref 13.0–17.0)
MCH: 32.4 pg (ref 26.0–34.0)
MCHC: 32.4 g/dL (ref 30.0–36.0)
MCV: 99.8 fL (ref 80.0–100.0)
Platelets: 189 10*3/uL (ref 150–400)
RBC: 4.11 MIL/uL — ABNORMAL LOW (ref 4.22–5.81)
RDW: 13.2 % (ref 11.5–15.5)
WBC: 8.8 10*3/uL (ref 4.0–10.5)
nRBC: 0 % (ref 0.0–0.2)

## 2022-03-13 LAB — BASIC METABOLIC PANEL
Anion gap: 10 (ref 5–15)
BUN: 6 mg/dL (ref 6–20)
CO2: 27 mmol/L (ref 22–32)
Calcium: 8.1 mg/dL — ABNORMAL LOW (ref 8.9–10.3)
Chloride: 101 mmol/L (ref 98–111)
Creatinine, Ser: 0.69 mg/dL (ref 0.61–1.24)
GFR, Estimated: >60 mL/min (ref >60)
Glucose, Bld: 84 mg/dL (ref 70–99)
Potassium: 4.1 mmol/L (ref 3.5–5.1)
Sodium: 138 mmol/L (ref 135–145)

## 2022-03-13 LAB — LEGIONELLA PNEUMOPHILA SEROGP 1 UR AG: L. pneumophila Serogp 1 Ur Ag: NEGATIVE

## 2022-03-13 LAB — GLUCOSE, CAPILLARY: Glucose-Capillary: 171 mg/dL — ABNORMAL HIGH (ref 70–99)

## 2022-03-13 NOTE — Progress Notes (Signed)
Patient stable with no hemoptysis ?Being treated for MSSA bronchitis vs pneumonia ?Hx of ABPA, MAC, Aspergilloma ? ?Hemodynamically stable ? ?Suggest 7 days of Keflex ? ?Will sign off ? ?Call as needed ?

## 2022-03-13 NOTE — Progress Notes (Signed)
Report called to receiving RN. PAtient with no complaints at the current time. Will transfer via [place]. ?

## 2022-03-13 NOTE — Progress Notes (Signed)
Nutrition Follow-up ? ?DOCUMENTATION CODES:  ? ?Severe malnutrition in context of chronic illness ? ?INTERVENTION:  ? ?Resume Ensure Enlive po BID, each supplement provides 350 kcal and 20 grams of protein. ? ?Continue MVI with minerals daily. ? ?Magic cup BID with meals, each supplement provides 290 kcal and 9 grams of protein. ? ?NUTRITION DIAGNOSIS:  ? ?Severe Malnutrition related to chronic illness (MAC infection) as evidenced by severe muscle depletion, severe fat depletion. ? ?Ongoing ? ?GOAL:  ? ?Patient will meet greater than or equal to 90% of their needs ? ?Unmet ? ?MONITOR:  ? ?PO intake, Supplement acceptance ? ?ASSESSMENT:  ? ?46 year old smoker with history of GERD, hypothyroidism, iron deficiency and ABPA/emphysema, MAC infection, left aspergilloma causing recurrent hemoptysis s/p left upper lobectomy who is admitted with hemoptysis. ? ?Discussed patient in ICU rounds and with RN today. ?Patient was extubated 4/23. ?Diet has been advanced to regular. ?Patient reports good intake.  ?He ate 100% of breakfast today. ?Given hx of malnutrition, will resume PO supplements. ? ?Labs reviewed.  ?CBG: [phone] on 4/24 ? ?Medications reviewed and include Remeron, MVI with minerals, prednisone. ? ?Diet Order:   ?Diet Order   ? ?       ?  Diet regular Room service appropriate? Yes; Fluid consistency: Thin  Diet effective now       ?  ? ?  ?  ? ?  ? ?EDUCATION NEEDS:  ? ?No education needs have been identified at this time ? ?Skin:  Skin Assessment: Reviewed RN Assessment ? ?Last BM:  4/24 type 6 ? ?Height:  ? ?Ht Readings from Last 1 Encounters:  ?03/11/22 _0  (1.727 m)  ? ? ?Weight:  ? ?Wt Readings from Last 1 Encounters:  ?03/11/22 52.7 kg  ? ? ?Ideal Body Weight:  70 kg ? ?BMI:  Body mass index is 17.67 kg/m?. ? ?Estimated Nutritional Needs:  ? ?Kcal:  1800-2000 ? ?Protein:  90-105 grams ? ?Fluid:  1.8-2 L ? ? ?Lucas Mallow RD, LDN, CNSC ?Please refer to Amion for contact information.                                                        ? ? ?

## 2022-03-13 NOTE — Progress Notes (Signed)
Family Medicine Teaching Service ?Daily Progress Note ?Intern Pager: [phone] ? ?Patient name: [name] Medical record number: [id number] ?Date of birth: 12/23/1975 Age: 46 y.o. Gender: male ? ?Primary Care Provider: Robert Bellow, PA-C ?Consultants: PCCM ?Code Status: Full ? ?Pt Overview and Major Events to Date:  ?4/20- admitted ?4/21- transferred to ICU with large volume hemoptysis, hemodynamic instability ?4/22- Bronchoscopy with PCCM, placed on ventilator ?4/23- Second bronchoscopy, patient self-extubated ?4/25- transferred back to FPTS ? ?Assessment and Plan: ?[name] is a 46 y.o. male presenting with hemoptysis, found to have MSSA pneumonia based on BAL. PMH is significant for Mycobacterium avium infection, allergic bronchopulmonary aspergillosis, history of aspergilloma s/p LUL lobectomy, bullous emphysema, hx of massive hemoptysis with IR embolization in 2016, hx of intrabronchial valve placement for pneumothorax in 2019, chronic pulmonary emboli-not on anticoagulation due to hemoptysis, and chronic hepatitis B.  ? ?Hemoptysis  MSSA Bacteremia  Bullous Emphysema  Pulmonary HTN ?Patient transferring back to our service after ICU stay with intubation. Now stable on 4L [place], up from 2L [place]. Multiple bronchoscopies demonstrating blood in airway but no source of active bleeding identified. Antibiotics de-escalated from Zosyn to Keflex with identification of MSSA on BAL. Symptomatically much improved with ongoing cough but minimal hemoptysis/sputum production.  Received last dose of TXA via neb yesterday.  ?Fungal culture and acid fast smear/culture remain negative.  ?- Continue Keflex (day 6 abx, CTX 4/20-4/21, Zosyn 4/21-4/24, Keflex 4/24- )  ?- Continue Hycodan for cough suppression ?- Will give a Duoneb at this time ?- Duonebs q6h with q4h PRN ?- Continue arformoterol nebs BID ?- Holding home pulmicort per pulm ?- Wean O2 as tolerated--no home requirement ?- Continue to follow Acid-fast culture/stain,  and fungal culture ?- If bleeding recurs, likely to need emergent embolization ? ?MAC Infection  ABPA with High IgE  Hx Aspergilloma s/p VATS ?- Continue home Azithromycin, Ethambutol, and Cresemba ?- Continue home prednisone 28m daily ? ?Hypthyroidism ?- Continue home Synthroid ? ?Severe protein calorie malnutrition due to loss of muscle mass and chronic illness ?- RD consulted ?- Mirtazapine 458mQHS ?- MVI ? ?Chronic, stable conditions ?Chronic Hep B- not contributing to presentation at this time ?Chronic PE- not seen on most recent CTA ? ?FEN/GI: Regular diet, MiraLAX and Colace ?PPx: SCDs, high bleed risk ?Dispo:Home in 2-3 days. Barriers include continued observation given high risk for recurrent bleed. Antibiotic therapy.  ? ?Subjective:  ?Mr. CoDickermaneports feeling better this morning. He cannot say whether the Hycodan is working any better for cough suppression than the Tussionex he was on previously.  ? ?Objective: ?Temp:  [97.6 ?F (36.4 ?C)-97.8 ?F (36.6 ?C)] 97.8 ?F (36.6 ?C) (04/24 1952) ?Pulse Rate:  [55-97] 62 (04/25 0100) ?Resp:  [14-24] 16 (04/24 1424) ?BP: (73-124)/(47-84) 95/65 (04/24 1952) ?SpO2:  [79 %-100 %] 99 % (04/25 0100) ?FiO2 (%):  [28 %] 28 % (04/24 1430) ?Physical Exam: ?General: Awake and alert, lying flat in bed, NAD ?Cardiovascular: Regular rate and rhythm, no murmur ?Respiratory: Wheezy throughout right side, normal WOB on 4L [place] ?Abdomen: Non-tender, non-distended ?Extremities: Digital clubbing, without edema ? ?Laboratory: ?Recent Labs  ?Lab 03/10/22 ?0137 03/10/22 ?1404 03/11/22 ?0588914/24/23 ?0123  ?WBC 11.1*  --  10.5 11.7*  ?HGB 13.1 12.6* 12.8* 13.1  ?HCT 39.7 37.0* 38.9* 40.5  ?PLT 263  --  322 179  ? ?Recent Labs  ?Lab 03/08/22 ?1303 03/10/22 ?0137 03/10/22 ?1404 03/11/22 ?0569454/24/23 ?0123  ?NA 136 139 137 138 142  ?  K 4.7 3.8 4.1 3.9 4.2  ?CL 105 103  --  107 109  ?CO2 21* 28  --  24 26  ?BUN 16 11  --  16 13  ?CREATININE 1.02 1.14  --  0.80 0.79  ?CALCIUM 8.6* 8.0*  --   8.0* 8.4*  ?PROT 7.0  --   --   --   --   ?BILITOT 1.0  --   --   --   --   ?ALKPHOS 87  --   --   --   --   ?ALT 13  --   --   --   --   ?AST 25  --   --   --   --   ?GLUCOSE 97 111*  --  170* 105*  ? ? ?Imaging/Diagnostic Tests: ?VAS [place] LOWER EXTREMITY VENOUS (DVT) ? Lower Venous DVT Study ? ?Patient Name:  [name]  Date of Exam:   03/10/2022 ?Medical Rec #: [id number]    Accession #:    [id number] ?Date of Birth: 05/25/1976    Patient Gender: M ?Patient Age:   69 years ?Exam Location:  [facility] ?Procedure:      VAS [place] LOWER EXTREMITY VENOUS (DVT) ?Referring Phys: Jenny Reichmann PAYNE ? ?-------------------------------------------------------------------------------- ?  ?Indications: SOB. ?  ?Risk Factors: Hemoptysis, nontuberculous mycobacteria. ?Limitations: Body habitus (BMI 19.01). ?Comparison Study: No prior study on file ? ?Performing Technologist: Sharion Dove RVS ? ?  ?Examination Guidelines: ?A complete evaluation includes B-mode imaging, spectral Doppler, color Doppler, ?and power Doppler as needed of all accessible portions of each vessel. Bilateral ?testing is considered an integral part of a complete examination. Limited ?examinations for reoccurring indications may be performed as noted. The reflux ?portion of the exam is performed with the patient in reverse Trendelenburg. ? ?  ? ?+---------+---------------+---------+-----------+----------+---------------+ ?RIGHT    CompressibilityPhasicitySpontaneityPropertiesThrombus Aging  ?+---------+---------------+---------+-----------+----------+---------------+ ?CFV      Full           Yes      Yes                                  ?+---------+---------------+---------+-----------+----------+---------------+ ?SFJ      Full                                                         ?+---------+---------------+---------+-----------+----------+---------------+ ?FV Prox  Full                                                          ?+---------+---------------+---------+-----------+----------+---------------+ ?FV Mid   Full                                                         ?+---------+---------------+---------+-----------+----------+---------------+ ?FV DistalFull                                                         ?+---------+---------------+---------+-----------+----------+---------------+ ?  PFV      Full                                                         ?+---------+---------------+---------+-----------+----------+---------------+ ?POP      Full           Yes      Yes                                  ?+---------+---------------+---------+-----------+----------+---------------+ ?PTV                                                   patent by color ?+---------+---------------+---------+-----------+----------+---------------+ ? ?  ? ?  ? ?+---------+---------------+---------+-----------+----------+--------------+ ?LEFT     CompressibilityPhasicitySpontaneityPropertiesThrombus Aging ?+---------+---------------+---------+-----------+----------+--------------+ ?CFV      Full           Yes      Yes                                 ?+---------+---------------+---------+-----------+----------+--------------+ ?SFJ      Full                                                        ?+---------+---------------+---------+-----------+----------+--------------+ ?FV Prox  Full                                                        ?+---------+---------------+---------+-----------+----------+--------------+ ?FV Mid   Full                                                        ?+---------+---------------+---------+-----------+----------+--------------+ ?FV DistalFull                                                        ?+---------+---------------+---------+-----------+----------+--------------+ ?PFV      Full                                                         ?+---------+---------------+---------+-----------+----------+--------------+ ?POP      Full           Yes      Yes                                 ?+---------+---------------+---------+-----------+----------+--------------+ ?  PTV      Full                                                        ?+---------+-------------

## 2022-03-13 NOTE — Telephone Encounter (Signed)
Received letter from [employer]. Patient's Xolair denial has benn OVERTURNED. Xolair is approbed from 02/13/21 through 02/14/23 ? ?Patient can continue to fill through [facility]: [phone] . Notified [facility] to fill patient's medication once he calls for refill. He is currently admitted. ? ?Authorization # [id number] ? ?Knox Saliva, PharmD, MPH, BCPS ?Clinical Pharmacist (Rheumatology and Pulmonology) ?

## 2022-03-13 NOTE — Progress Notes (Signed)
FPTS Brief Progress Note ? ?S: Patient seen and assessed at bedside.  No acute distress at this time.  Reports having some mild cough but no hemoptysis. ? ? ?O: ?BP 98/67 (BP Location: Right Arm)   Pulse 71   Temp 98.3 ?F (36.8 ?C) (Oral)   Resp 17   Ht 5' 8" (1.727 m)   Wt 116 lb 2.9 oz (52.7 kg)   SpO2 98%   BMI 17.67 kg/m?   ? ? ?A/P: ?-Plan per day team. Weaning O2 as able ?- Orders reviewed. Labs for AM ordered, which was adjusted as needed.  ?- If condition changes, plan includes bedside eval.  ? ?France Ravens, MD ?03/13/2022, 11:29 PM ?PGY-1, [facility] Night Resident  ?Please page [phone] with questions.  ? ?

## 2022-03-13 NOTE — Progress Notes (Signed)
Pt arrived to unit from ICU via [place]. Upon initial assessment, pt is awake A&Ox4 at baseline with no discomfort or distress noted; stable on 2L [place]. ? ?

## 2022-03-14 ENCOUNTER — Encounter (HOSPITAL_COMMUNITY): Payer: Self-pay | Admitting: Student

## 2022-03-14 DIAGNOSIS — I2723 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s and hypoxia: Secondary | ICD-10-CM

## 2022-03-14 DIAGNOSIS — B4481 Allergic bronchopulmonary aspergillosis: Secondary | ICD-10-CM | POA: Diagnosis not present

## 2022-03-14 DIAGNOSIS — J9601 Acute respiratory failure with hypoxia: Secondary | ICD-10-CM | POA: Diagnosis not present

## 2022-03-14 DIAGNOSIS — J9611 Chronic respiratory failure with hypoxia: Secondary | ICD-10-CM

## 2022-03-14 DIAGNOSIS — R636 Underweight: Secondary | ICD-10-CM | POA: Diagnosis present

## 2022-03-14 DIAGNOSIS — R042 Hemoptysis: Secondary | ICD-10-CM | POA: Diagnosis not present

## 2022-03-14 DIAGNOSIS — J439 Emphysema, unspecified: Secondary | ICD-10-CM

## 2022-03-14 DIAGNOSIS — J15211 Pneumonia due to Methicillin susceptible Staphylococcus aureus: Secondary | ICD-10-CM | POA: Diagnosis present

## 2022-03-14 HISTORY — DX: Emphysema, unspecified: J43.9

## 2022-03-14 HISTORY — DX: 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s and hypoxia: I27.23

## 2022-03-14 LAB — COMPREHENSIVE METABOLIC PANEL
ALT: 14 U/L (ref 0–44)
AST: 16 U/L (ref 15–41)
Albumin: 2.8 g/dL — ABNORMAL LOW (ref 3.5–5.0)
Alkaline Phosphatase: 58 U/L (ref 38–126)
Anion gap: 5 (ref 5–15)
BUN: 6 mg/dL (ref 6–20)
CO2: 34 mmol/L — ABNORMAL HIGH (ref 22–32)
Calcium: 8.4 mg/dL — ABNORMAL LOW (ref 8.9–10.3)
Chloride: 101 mmol/L (ref 98–111)
Creatinine, Ser: 0.65 mg/dL (ref 0.61–1.24)
GFR, Estimated: >60 mL/min (ref >60)
Glucose, Bld: 86 mg/dL (ref 70–99)
Potassium: 3.7 mmol/L (ref 3.5–5.1)
Sodium: 140 mmol/L (ref 135–145)
Total Bilirubin: 0.4 mg/dL (ref 0.3–1.2)
Total Protein: 6 g/dL — ABNORMAL LOW (ref 6.5–8.1)

## 2022-03-14 LAB — CBC
HCT: 38.1 % — ABNORMAL LOW (ref 39.0–52.0)
Hemoglobin: 12.8 g/dL — ABNORMAL LOW (ref 13.0–17.0)
MCH: 32.9 pg (ref 26.0–34.0)
MCHC: 33.6 g/dL (ref 30.0–36.0)
MCV: 97.9 fL (ref 80.0–100.0)
Platelets: 191 10*3/uL (ref 150–400)
RBC: 3.89 MIL/uL — ABNORMAL LOW (ref 4.22–5.81)
RDW: 13.1 % (ref 11.5–15.5)
WBC: 7.3 10*3/uL (ref 4.0–10.5)
nRBC: 0 % (ref 0.0–0.2)

## 2022-03-14 MED ORDER — ENSURE ENLIVE PO LIQD
237.0000 mL | Freq: Three times a day (TID) | ORAL | Status: DC
  Administered 2022-03-14: 237 mL via ORAL

## 2022-03-14 MED ORDER — IPRATROPIUM-ALBUTEROL 0.5-2.5 (3) MG/3ML IN SOLN
3.0000 mL | Freq: Two times a day (BID) | RESPIRATORY_TRACT | Status: DC
  Administered 2022-03-14 – 2022-03-15 (×2): 3 mL via RESPIRATORY_TRACT
  Filled 2022-03-14 (×2): qty 3

## 2022-03-14 NOTE — Progress Notes (Signed)
FPTS Brief Progress Note ? ?S: Patient seen and assessed at bedside.  Reports having no acute concerns at this time.  Feels ready to go home at this time.  ? ? ?O: ?BP 96/67 (BP Location: Left Arm)   Pulse 83   Temp 99 ?F (37.2 ?C) (Oral)   Resp 18   Ht 5' 8" (1.727 m)   Wt 51.8 kg   SpO2 90%   BMI 17.36 kg/m?   ? ? ?A/P: ?- Plans per day team, possibly discharge if no acute events overnight ?- Orders reviewed. Labs for AM ordered, which was adjusted as needed.  ?- If condition changes, plan includes bedside eval.  ? ?France Ravens, MD ?03/14/2022, 7:52 PM ?PGY-1, [place] [facility] Night Resident  ?Please page [phone] with questions.  ? ?

## 2022-03-14 NOTE — Progress Notes (Signed)
Interim Progress Note ? ?Notified by RN that patient had two brief and asymptomatic episodes of SVT. Went up to view the tele, appears he had one episode around 1600 and another around 1750 that lasted 10-15 seconds in duration, HR ~150s.  ? ?Discussed with patient who feels this most likely happened during his coughing episodes. He typically feels his heart race when he coughs a lot and this is normal for him. Currently he denies any chest pains, palpitations, shortness of breath. He has not coughed up any blood today.  ? ?Exam ?Blood pressure 117/67, pulse 64, temperature 98 ?F (36.7 ?C), temperature source Oral, resp. rate 18, height _0  (1.727 m), weight 51.8 kg, SpO2 95 %. ?Gen: [place], NAD, laying in bed ?Resp: on RA (has [place] below chil), abdominal breathing, intermittent dry cough ?Cardio: RRR  ? ?A/P ?SVT:  ?Asymptomatic and brief duration. Converted back to sinus without intervention. Likely from his chronic lung disease. Electrolytes normal today. Will continue to monitor on tele. Reassuringly, he feels well and feels that his cough/breathing is back to baseline. I instructed him to alert the night team if his symptoms worsen or if he develops any chest pain, palpitations, etc. We also discussed vagal maneuvers that he can try if he feels his heart racing again.  ? ?

## 2022-03-14 NOTE — Progress Notes (Addendum)
Mobility Specialist Progress Note: ?SATURATION QUALIFICATIONS: (This note is used to comply with regulatory documentation for home oxygen) ? ?Patient Saturations on Room Air at Rest = 90% ? ?Patient Saturations on Room Air while Ambulating = 85% ? ?Patient Saturations on 2 Liters of oxygen while Ambulating = 92% ? ?[name] ?Mobility Specialist ?Primary Phone [phone] ? ?

## 2022-03-14 NOTE — Progress Notes (Signed)
Family Medicine Teaching Service ?Daily Progress Note ?Intern Pager: [phone] ? ?Patient name: [name] Medical record number: [id number] ?Date of birth: 1976/06/24 Age: 46 y.o. Gender: male ? ?Primary Care Provider: Robert Bellow, PA-C ?Consultants: PCCM (s/o) ?Code Status: Full ? ?Pt Overview and Major Events to Date:  ?4/20- admitted ?4/21- transferred to ICU with large volume hemoptysis, hemodynamic instability ?4/22- Bronchoscopy with PCCM, placed on ventilator ?4/23- Second bronchoscopy, patient self-extubated ?4/25- transferred back to FPTS ? ?Assessment and Plan: ?[name] is a 46 y.o. male presenting with hemoptysis, found to have MSSA pneumonia based on BAL. PMH is significant for Mycobacterium avium infection, allergic bronchopulmonary aspergillosis, history of aspergilloma s/p LUL lobectomy, bullous emphysema, hx of massive hemoptysis with IR embolization in 2016, hx of intrabronchial valve placement for pneumothorax in 2019, chronic pulmonary emboli-not on anticoagulation due to hemoptysis, and chronic hepatitis B.  ? ?Hemoptysis  MSSA Pneumonia  Bullous Emphysema  Pulmonary HTN ?Weaned 4L>2L yesterday. I weaned him further to RA during my assessment. Hemoptysis and cough have improved overnight, he denies any hemoptyis overnight. Patient remains on Keflex (day 7, end date 4/28). Fungal culture and acid fast culture remain negative. ?- Continue Keflex until 4/28 ?- Continue Hycodan for cough suppression ?- Duonebs q6h with q4h PRN ?- Continue BID arformoterol nebs ?- Holding home pulmicort--likely will not continue on discharge ?- Wean O2 ?- Following acid-fast and fungal culture ?- May require emergent embolization if large hemoptysis recurs ? ?MAC infection  ABPA with High IgE  Hx Aspergilloma s/p VATS ?- Continue home Azithromycin, Ethambutol, and Cresemba ?- Continue home prednisone 32m daily ? ?Hypothyroidism ?- Continue home Synthroid ? ?Severe protein calorie malnutrition due to loss  of muscle mass and chronic illness ?- RD consulted, recs for supplementation ?- Mirtazapine 455mQHS ?- MVI ? ?Chronic, stable conditions ?Chronic Hep B- not contributing to presentation at this time ?Chronic PE- not seen on most recent CTA ? ?FEN/GI: Regular diet, MiraLAX ?PPx: SCDs, high bleed risk ?Dispo:Home tomorrow. Barriers include stability off O2.  ? ?Subjective:  ?[name] to feel better day by day.  He is still coughing but he says that he seems to be coughing about at his baseline, denies any hemoptysis overnight. ? ?Objective: ?Temp:  [97.8 ?F (36.6 ?C)-98.3 ?F (36.8 ?C)] 98.1 ?F (36.7 ?C) (04/26 0449) ?Pulse Rate:  [64-74] 65 (04/26 0449) ?Resp:  [16-20] 18 (04/26 0449) ?BP: (98-113)/(66-86) 113/75 (04/26 0449) ?SpO2:  [96 %-100 %] 100 % (04/26 0449) ?FiO2 (%):  [32 %-36 %] 32 % (04/25 1403) ?Weight:  [51.8 kg] 51.8 kg (04/26 0449) ?Physical Exam: ?General: Awake and alert, NAD, in good spirits ?Cardiovascular: RRR, no murmurs ?Respiratory: Normal WOB on RA, coarse lung sounds throughout but non-focal and without wheeze today ?Abdomen: Non-tender, non-distended ?Extremities: Digital clubbing, no edema ? ?Laboratory: ?Recent Labs  ?Lab 03/12/22 ?0123 03/13/22 ?0702584/26/23 ?0133  ?WBC 11.7* 8.8 7.3  ?HGB 13.1 13.3 12.8*  ?HCT 40.5 41.0 38.1*  ?PLT 179 189 191  ? ?Recent Labs  ?Lab 03/08/22 ?1303 03/10/22 ?0137 03/12/22 ?0123 03/13/22 ?0752774/26/23 ?0133  ?NA 136   < > 142 138 140  ?K 4.7   < > 4.2 4.1 3.7  ?CL 105   < > 109 101 101  ?CO2 21*   < > 26 27 34*  ?BUN 16   < > _0 ?CREATININE 1.02   < > 0.79 0.69 0.65  ?CALCIUM 8.6*   < >  8.4* 8.1* 8.4*  ?PROT 7.0  --   --   --  6.0*  ?BILITOT 1.0  --   --   --  0.4  ?ALKPHOS 87  --   --   --  58  ?ALT 13  --   --   --  14  ?AST 25  --   --   --  16  ?GLUCOSE 97   < > 105* 84 86  ? < > = values in this interval not displayed.  ? ? ?Imaging/Diagnostic Tests: ?No new imaging, tests ? ?Eppie Gibson, MD ?03/14/2022, 6:36 AM ?PGY-1, [facility] ?[facility] Intern pager: [phone], text pages welcome ? ?

## 2022-03-14 NOTE — TOC Initial Note (Signed)
Transition of Care (TOC) - Initial/Assessment Note  ? ? ?Patient Details  ?Name: [name] ?MRN: [id number] ?Date of Birth: Jan 18, 1976 ? ?Transition of Care (TOC) CM/SW Contact:    ?Marilu Favre, RN ?Phone Number: ?03/14/2022, 3:38 PM ? ?Clinical Narrative:                 ? ?Patient from home alone.  ? ?Anticipated discharge date tomorrow . Patient already has home oxygen with [facility]. He has portable tanks at home. He does have transportation home tomorrow , however his ride will not be an\ble to bring tank . ? ?NCM spoke to [facility] with [place] she will have portable tank delivered to patient's room prior to discharge  ?Expected Discharge Plan: Home/Self Care ?Barriers to Discharge: Continued Medical Work up ? ? ?Patient Goals and CMS Choice ?Patient states their goals for this hospitalization and ongoing recovery are:: to return to home ?CMS Medicare.gov Compare Post Acute Care list provided to:: Patient ?Choice offered to / list presented to : Patient ? ?Expected Discharge Plan and Services ?Expected Discharge Plan: Home/Self Care ?  ?Discharge Planning Services: CM Consult ?  ?Living arrangements for the past 2 months: [place] ?                ?DME Arranged: Oxygen ?DME Agency: [employer] ?Date DME Agency Contacted: 03/14/22 ?Time DME Agency Contacted: [phone] ?Representative spoke with at DME Agency: Delana Meyer ?HH Arranged: NA ?  ?  ?  ?  ? ?Prior Living Arrangements/Services ?Living arrangements for the past 2 months: [place] ?Lives with:: Self ?Patient language and need for interpreter reviewed:: Yes ?Do you feel safe going back to the place where you live?: Yes      ?Need for Family Participation in Patient Care: Yes (Comment) ?Care giver support system in place?: Yes (comment) ?Current home services: DME ?Criminal Activity/Legal Involvement Pertinent to Current Situation/Hospitalization: No - Comment as needed ? ?Activities of Daily Living ?  ?  ? ?Permission  Sought/Granted ?  ?Permission granted to share information with : No ?   ?   ?   ?   ? ?Emotional Assessment ?Appearance:: Appears stated age ?Attitude/Demeanor/Rapport: Engaged ?Affect (typically observed): Accepting ?Orientation: : Oriented to Self, Oriented to Place, Oriented to  Time, Oriented to Situation ?Alcohol / Substance Use: Not Applicable ?Psych Involvement: No (comment) ? ?Admission diagnosis:  Community acquired pneumonia [J18.9] ?Hemoptysis [R04.2] ?Multifocal pneumonia [J18.9] ?Community acquired pneumonia, unspecified laterality [J18.9] ?CAP (community acquired pneumonia) [J18.9] ?Patient Active Problem List  ? Diagnosis Date Noted  ? 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[place]) 03/14/2022  ? 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 MSSA (methicillin susceptible Staphylococcus aureus) pneumonia ([place]) 03/14/2022  ? Underweight, BMI < 18.5% 03/14/2022  ? Acute respiratory failure ([place])   ? Acute encephalopathy   ? Rib fracture 02/08/2021  ? Chronic respiratory failure with hypoxia ([place]) 02/06/2021  ? Bronchiectasis ([place]) 12/12/2020  ? Chronic viral hepatitis B without delta-agent ([place]) 11/23/2020  ? Protein-calorie malnutrition, severe 09/05/2018  ? 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 [facility]) 06/17/2018  ? GERD (gastroesophageal reflux disease) 08/09/2017  ? Hypothyroidism 08/09/2017  ? Iron deficiency anemia 08/09/2017  ? Weight loss 10/15/2016  ? Severe persistent asthma 01/16/2016  ? Mold exposure 12/05/2015  ? Invasive pulmonary aspergillosis ([place]) 10/26/2015  ? Massive hemoptysis, recurrent   ? Mycobacterium avium-intracellulare complex ([place])   ? Dyspnea 05/24/2014  ? Osteoporosis, unspecified 03/10/2013  ? Osteoporosis 09/22/2012  ? Asthmatic bronchitis ,  chronic ([place]) 01/30/2012  ? Mycobacterium avium infection ([place]) 06/27/2011  ? 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[place]) 03/27/2011  ? ABPA (allergic bronchopulmonary aspergillosis) ([place]) 03/22/2011  ? Mycobacterium avium complex ([place]) 12/28/2010  ? ?PCP:  Robert Bellow, PA-C ?Pharmacy:   ?[facility], [place] - [address] ?[address] ?[address] ?[place] [address] ?Phone: [phone] Fax: [phone] ? ?[id number] [place], [address] AT [facility] OF [address] [place] & [address] ?[place] ?[place] [address] ?Phone: [phone] Fax: [phone] ? ?[id number] - [place] - [address] AT [address] ?[address] ?[place] [address] ?Phone: [phone] Fax: [phone] ? ?[facility] - [place], [place] - [address] ?[address] ?[place] [address] ?Phone: [phone] Fax: [phone] ? ?[facility] ?[address] ?[place] [address] ?Phone: [phone] Fax: [phone] ? ? ? ? ?Social Determinants of Health (SDOH) Interventions ?  ? ?Readmission Risk Interventions ?   ? View : No data to display.  ?  ?  ?  ? ? ? ?

## 2022-03-14 NOTE — Progress Notes (Signed)
Mobility Specialist Progress Note: ? ? 03/14/22 1333  ?Mobility  ?Activity Ambulated with assistance in hallway  ?Level of Assistance Independent after set-up  ?Assistive Device  ?(held dynomap)  ?Distance Ambulated (ft) 200 ft  ?Activity Response Tolerated well  ?$Mobility charge 1 Mobility  ? ?Pt received in bed willing to participate in mobility. No complaints of pain. Left in bed with call bell in reach and all needs met.  ? ?[name] ?Mobility Specialist ?Primary Phone [phone] ? ?

## 2022-03-14 NOTE — Evaluation (Signed)
Physical Therapy Evaluation ?Patient Details ?Name: [name] ?MRN: [id number] ?DOB: 02-06-1976 ?Today's Date: 03/14/2022 ? ?History of Present Illness ? 46 y/o male presented to ED on 03/08/22 for SOB and coughing up blood. Intubated 4/22, self extubated 4/23. PMH: allergic bronchopulmonary aspergillosis, hx of aspergilloma s/p LUL lobectomy, emphysema, hx of massive hemoptysis with IR embolization in 2016, chronic hep B, pulmonary HTN  ?Clinical Impression ? Patient admitted with the above. Patient presents with generalized weakness, decreased activity tolerance, and mild balance deficits. Patient at supervision level for mobility with no AD but requires up to 3L O2 to maintian spO2 >88%. Patient has O2 at home PTA for PRN use. Patient will benefit from skilled PT services during acute stay to address listed deficits. No PT follow up recommended at this time, anticipate patient will progress well once increasing mobility.    ?   ? ?Recommendations for follow up therapy are one component of a multi-disciplinary discharge planning process, led by the attending physician.  Recommendations may be updated based on patient status, additional functional criteria and insurance authorization. ? ?Follow Up Recommendations No PT follow up ? ?  ?Assistance Recommended at Discharge Intermittent Supervision/Assistance  ?Patient can return home with the following ?   ? ?  ?Equipment Recommendations None recommended by PT  ?Recommendations for Other Services ?    ?  ?Functional Status Assessment Patient has had a recent decline in their functional status and demonstrates the ability to make significant improvements in function in a reasonable and predictable amount of time.  ? ?  ?Precautions / Restrictions Precautions ?Precautions: Fall ?Precaution Comments: watch O2 ?Restrictions ?Weight Bearing Restrictions: No  ? ?  ? ?Mobility ? Bed Mobility ?Overal bed mobility: Modified Independent ?  ?  ?  ?  ?  ?  ?  ?   ? ?Transfers ?Overall transfer level: Needs assistance ?Equipment used: None ?Transfers: Sit to/from Stand ?Sit to Stand: Supervision ?  ?  ?  ?  ?  ?General transfer comment: supervision for safety ?  ? ?Ambulation/Gait ?Ambulation/Gait assistance: Supervision ?Gait Distance (Feet): 150 Feet ?Assistive device: None ?Gait Pattern/deviations: Step-through pattern, Decreased stride length, Drifts right/left ?Gait velocity: decreased ?  ?  ?General Gait Details: supervision for safety and line management. No overt LOB but patient reporting unsteadiness due to being sedentary in hospital ? ?Stairs ?  ?  ?  ?  ?  ? ?Wheelchair Mobility ?  ? ?Modified Rankin (Stroke Patients Only) ?  ? ?  ? ?Balance Overall balance assessment: Mild deficits observed, not formally tested ?  ?  ?  ?  ?  ?  ?  ?  ?  ?  ?  ?  ?  ?  ?  ?  ?  ?  ?   ? ? ? ?Pertinent Vitals/Pain Pain Assessment ?Pain Assessment: No/denies pain  ? ? ?Home Living Family/patient expects to be discharged to:: Private residence ?Living Arrangements: Alone ?Available Help at Discharge: Family;Friend(s) ?Type of Home: House ?Home Access: Level entry ?  ?  ?  ?Home Layout: Two level;Able to live on main level with bedroom/bathroom ?Home Equipment: None ?   ?  ?Prior Function Prior Level of Function : Independent/Modified Independent ?  ?  ?  ?  ?  ?  ?Mobility Comments: has O2 at home PRN ?  ?  ? ? ?Hand Dominance  ?   ? ?  ?Extremity/Trunk Assessment  ? Upper Extremity Assessment ?Upper Extremity Assessment: Generalized weakness ?  ? ?  Lower Extremity Assessment ?Lower Extremity Assessment: Generalized weakness ?  ? ?   ?Communication  ? Communication: No difficulties  ?Cognition Arousal/Alertness: Awake/alert ?Behavior During Therapy: [facility] for tasks assessed/performed ?Overall Cognitive Status: Within Functional Limits for tasks assessed ?  ?  ?  ?  ?  ?  ?  ?  ?  ?  ?  ?  ?  ?  ?  ?  ?  ?  ?  ? ?  ?General Comments General comments (skin integrity, edema, etc.):  Placed on RA initially with drop to 83% when coming to sitting EOB. Placed back on 3L O2 [place] with increase to 92%. Ambulated on 3L O2 with drop in spO2 to 87% at end of session ? ?  ?Exercises    ? ?Assessment/Plan  ?  ?PT Assessment Patient needs continued PT services  ?PT Problem List Decreased strength;Decreased activity tolerance;Decreased balance;Cardiopulmonary status limiting activity ? ?   ?  ?PT Treatment Interventions DME instruction;Gait training;Functional mobility training;Therapeutic activities;Therapeutic exercise;Balance training;Stair training;Patient/family education   ? ?PT Goals (Current goals can be found in the Care Plan section)  ?Acute Rehab PT Goals ?Patient Stated Goal: to go home ?PT Goal Formulation: With patient ?Time For Goal Achievement: 03/28/22 ?Potential to Achieve Goals: Good ? ?  ?Frequency Min 2X/week ?  ? ? ?Co-evaluation   ?  ?  ?  ?  ? ? ?  ?AM-PAC PT "6 Clicks" Mobility  ?Outcome Measure Help needed turning from your back to your side while in a flat bed without using bedrails?: None ?Help needed moving from lying on your back to sitting on the side of a flat bed without using bedrails?: None ?Help needed moving to and from a bed to a chair (including a wheelchair)?: A Little ?Help needed standing up from a chair using your arms (e.g., wheelchair or bedside chair)?: A Little ?Help needed to walk in hospital room?: A Little ?Help needed climbing 3-5 steps with a railing? : A Little ?6 Click Score: 20 ? ?  ?End of Session Equipment Utilized During Treatment: Gait belt ?Activity Tolerance: Patient tolerated treatment well ?Patient left: in bed;with call bell/phone within reach ?Nurse Communication: Mobility status ?PT Visit Diagnosis: Muscle weakness (generalized) (M62.81);Unsteadiness on feet (R26.81) ?  ? ?Time: 9741-6384 ?PT Time Calculation (min) (ACUTE ONLY): 19 min ? ? ?Charges:   PT Evaluation ?$PT Eval Moderate Complexity: 1 Mod ?  ?  ?   ? ? ?Nataleah Scioneaux A. Gilford Rile, PT,  DPT ?Acute Rehabilitation Services ?Pager [phone] ?Office [phone] ? ? ?Adine Heimann A Valery Amedee ?03/14/2022, 4:26 PM ? ?

## 2022-03-15 ENCOUNTER — Inpatient Hospital Stay (HOSPITAL_COMMUNITY): Payer: BC Managed Care – PPO

## 2022-03-15 ENCOUNTER — Other Ambulatory Visit (HOSPITAL_COMMUNITY): Payer: Self-pay

## 2022-03-15 DIAGNOSIS — J439 Emphysema, unspecified: Secondary | ICD-10-CM

## 2022-03-15 DIAGNOSIS — I471 Supraventricular tachycardia: Secondary | ICD-10-CM

## 2022-03-15 DIAGNOSIS — A310 Pulmonary mycobacterial infection: Secondary | ICD-10-CM

## 2022-03-15 DIAGNOSIS — J15211 Pneumonia due to Methicillin susceptible Staphylococcus aureus: Secondary | ICD-10-CM | POA: Diagnosis not present

## 2022-03-15 DIAGNOSIS — I517 Cardiomegaly: Secondary | ICD-10-CM

## 2022-03-15 DIAGNOSIS — I77810 Thoracic aortic ectasia: Secondary | ICD-10-CM

## 2022-03-15 DIAGNOSIS — B4481 Allergic bronchopulmonary aspergillosis: Secondary | ICD-10-CM | POA: Diagnosis not present

## 2022-03-15 DIAGNOSIS — I361 Nonrheumatic tricuspid (valve) insufficiency: Secondary | ICD-10-CM

## 2022-03-15 LAB — BASIC METABOLIC PANEL
Anion gap: 7 (ref 5–15)
BUN: 7 mg/dL (ref 6–20)
CO2: 34 mmol/L — ABNORMAL HIGH (ref 22–32)
Calcium: 8.8 mg/dL — ABNORMAL LOW (ref 8.9–10.3)
Chloride: 98 mmol/L (ref 98–111)
Creatinine, Ser: 0.83 mg/dL (ref 0.61–1.24)
GFR, Estimated: >60 mL/min (ref >60)
Glucose, Bld: 103 mg/dL — ABNORMAL HIGH (ref 70–99)
Potassium: 3.8 mmol/L (ref 3.5–5.1)
Sodium: 139 mmol/L (ref 135–145)

## 2022-03-15 LAB — CBC
HCT: 38.5 % — ABNORMAL LOW (ref 39.0–52.0)
Hemoglobin: 12.8 g/dL — ABNORMAL LOW (ref 13.0–17.0)
MCH: 32.2 pg (ref 26.0–34.0)
MCHC: 33.2 g/dL (ref 30.0–36.0)
MCV: 97 fL (ref 80.0–100.0)
Platelets: 200 10*3/uL (ref 150–400)
RBC: 3.97 MIL/uL — ABNORMAL LOW (ref 4.22–5.81)
RDW: 12.9 % (ref 11.5–15.5)
WBC: 9.3 10*3/uL (ref 4.0–10.5)
nRBC: 0 % (ref 0.0–0.2)

## 2022-03-15 LAB — ECHOCARDIOGRAM COMPLETE
AR max vel: 4.36 cm2
AV Area VTI: 4.53 cm2
AV Area mean vel: 3.89 cm2
AV Mean grad: 2 mmHg
AV Peak grad: 3.7 mmHg
Ao pk vel: 0.96 m/s
Area-P 1/2: 3.91 cm2
Calc EF: 52.6 %
Height: 68 in
S' Lateral: 2 cm
Single Plane A2C EF: 49.3 %
Single Plane A4C EF: 54 %
Weight: 1883.61 oz

## 2022-03-15 MED ORDER — CEPHALEXIN 500 MG PO CAPS
500.0000 mg | ORAL_CAPSULE | Freq: Three times a day (TID) | ORAL | 0 refills | Status: AC
Start: 2022-03-15 — End: 2022-03-17
  Filled 2022-03-15: qty 4, 2d supply, fill #0

## 2022-03-15 MED ORDER — HYDROCOD POLI-CHLORPHE POLI ER 10-8 MG/5ML PO SUER
5.0000 mL | Freq: Two times a day (BID) | ORAL | 0 refills | Status: DC | PRN
  Filled 2022-03-15: qty 115, 12d supply, fill #0

## 2022-03-15 MED ORDER — HYDROCODONE BIT-HOMATROP MBR 5-1.5 MG/5ML PO SOLN
5.0000 mL | Freq: Four times a day (QID) | ORAL | 0 refills | Status: DC | PRN
  Filled 2022-03-15: qty 120, 6d supply, fill #0

## 2022-03-15 NOTE — Evaluation (Signed)
Occupational Therapy Evaluation ?Patient Details ?Name: [name] ?MRN: [id number] ?DOB: 1976/03/25 ?Today's Date: 03/15/2022 ? ? ?History of Present Illness 46 y/o male presented to ED on 03/08/22 for SOB and coughing up blood. Intubated 4/22, self extubated 4/23. PMH: allergic bronchopulmonary aspergillosis, hx of aspergilloma s/p LUL lobectomy, emphysema, hx of massive hemoptysis with IR embolization in 2016, chronic hep B, pulmonary HTN  ? ?Clinical Impression ?  ?Pt is typically independent, lives alone and is a Technical brewer for [employer]. He uses 02 intermittently at home. Pt presents with generalized weakness and decreased activity tolerance. Pt, overall, is functioning at a set up to supervision level. Educated in energy conservation, recommended seated showering and reminded pt to wear 02 in shower. Provided written handout to reinforce. Pt verbalized understanding. No further OT needs.  ?   ? ?Recommendations for follow up therapy are one component of a multi-disciplinary discharge planning process, led by the attending physician.  Recommendations may be updated based on patient status, additional functional criteria and insurance authorization.  ? ?Follow Up Recommendations ? No OT follow up  ?  ?Assistance Recommended at Discharge PRN  ?Patient can return home with the following Assist for transportation ? ?  ?Functional Status Assessment ? Patient has had a recent decline in their functional status and demonstrates the ability to make significant improvements in function in a reasonable and predictable amount of time.  ?Equipment Recommendations ? Tub/shower seat (pt declined)  ?  ?Recommendations for Other Services   ? ? ?  ?Precautions / Restrictions Precautions ?Precautions: Fall ?Precaution Comments: watch O2 ?Restrictions ?Weight Bearing Restrictions: No  ? ?  ? ?Mobility Bed Mobility ?Overal bed mobility: Modified Independent ?  ?  ?  ?  ?  ?  ?  ?  ? ?Transfers ?Overall transfer level: Needs  assistance ?Equipment used: None ?Transfers: Sit to/from Stand ?Sit to Stand: Supervision ?  ?  ?  ?  ?  ?General transfer comment: supervision for safety ?  ? ?  ?Balance Overall balance assessment: Mild deficits observed, not formally tested ?  ?  ?  ?  ?  ?  ?  ?  ?  ?  ?  ?  ?  ?  ?  ?  ?  ?  ?   ? ?ADL either performed or assessed with clinical judgement  ? ?ADL Overall ADL's : Needs assistance/impaired ?Eating/Feeding: Independent;Sitting ?  ?Grooming: Standing;Supervision/safety;Wash/dry hands ?  ?Upper Body Bathing: Set up;Sitting ?  ?Lower Body Bathing: Supervison/ safety;Sit to/from stand ?  ?Upper Body Dressing : Set up;Sitting ?  ?Lower Body Dressing: Supervision/safety;Sit to/from stand ?  ?Toilet Transfer: Supervision/safety;Ambulation ?  ?Toileting- Clothing Manipulation and Hygiene: Supervision/safety;Sit to/from stand ?  ?  ?  ?Functional mobility during ADLs: Supervision/safety ?General ADL Comments: Educated in energy conservation strategies, recommended use of 02 in shower and shower seat.  ? ? ? ?Vision Baseline Vision/History: 0 No visual deficits ?Ability to See in Adequate Light: 0 Adequate ?   ?   ?Perception   ?  ?Praxis   ?  ? ?Pertinent Vitals/Pain Pain Assessment ?Pain Assessment: No/denies pain  ? ? ? ?Hand Dominance Right ?  ?Extremity/Trunk Assessment Upper Extremity Assessment ?Upper Extremity Assessment: Overall WFL for tasks assessed ?  ?Lower Extremity Assessment ?Lower Extremity Assessment: Defer to PT evaluation ?  ?Cervical / Trunk Assessment ?Cervical / Trunk Assessment: Normal ?  ?Communication Communication ?Communication: No difficulties ?  ?Cognition Arousal/Alertness: Awake/alert ?Behavior During Therapy: [facility]  for tasks assessed/performed ?Overall Cognitive Status: Within Functional Limits for tasks assessed ?  ?  ?  ?  ?  ?  ?  ?  ?  ?  ?  ?  ?  ?  ?  ?  ?  ?  ?  ?General Comments    ? ?  ?Exercises   ?  ?Shoulder Instructions    ? ? ?Home Living Family/patient expects to  be discharged to:: Private residence ?Living Arrangements: Alone ?Available Help at Discharge: Family;Friend(s) ?Type of Home: House ?Home Access: Level entry ?  ?  ?Home Layout: Two level;Able to live on main level with bedroom/bathroom ?  ?  ?Bathroom Shower/Tub: Tub/shower unit ?  ?Bathroom Toilet: Standard ?  ?  ?Home Equipment: Other (comment) (02) ?  ?Additional Comments: pt has 2 pulse ox ?  ? ?  ?Prior Functioning/Environment Prior Level of Function : Independent/Modified Independent ?  ?  ?  ?  ?  ?  ?  ?ADLs Comments: works in [place] for [employer] ?  ? ?  ?  ?OT Problem List:   ?  ?   ?OT Treatment/Interventions:    ?  ?OT Goals(Current goals can be found in the care plan section)    ?OT Frequency:   ?  ? ?Co-evaluation   ?  ?  ?  ?  ? ?  ?AM-PAC OT "6 Clicks" Daily Activity     ?Outcome Measure Help from another person eating meals?: None ?Help from another person taking care of personal grooming?: None ?Help from another person toileting, which includes using toliet, bedpan, or urinal?: None ?Help from another person bathing (including washing, rinsing, drying)?: None ?Help from another person to put on and taking off regular upper body clothing?: None ?Help from another person to put on and taking off regular lower body clothing?: None ?6 Click Score: 24 ?  ?End of Session Equipment Utilized During Treatment: Oxygen ? ?Activity Tolerance: Patient tolerated treatment well ?Patient left: in bed;with call bell/phone within reach ? ?OT Visit Diagnosis: Other (comment) (decreased activity tolerance)  ?              ?Time: 8264-1583 ?OT Time Calculation (min): 16 min ?Charges:  OT General Charges ?$OT Visit: 1 Visit ?OT Evaluation ?$OT Eval Low Complexity: 1 Low ? ?Nestor Lewandowsky, OTR/L ?Acute Rehabilitation Services ?Pager: [phone] ?Office: [phone]  ?Malka So ?03/15/2022, 9:11 AM ?

## 2022-03-15 NOTE — Progress Notes (Signed)
Family Medicine Teaching Service ?Daily Progress Note ?Intern Pager: [phone] ? ?Patient name: [name] Medical record number: [id number] ?Date of birth: 08/21/1976 Age: 46 y.o. Gender: male ? ?Primary Care Provider: Robert Bellow, PA-C ?Consultants: PCCM (s/o) ?Code Status: Full ? ?Pt Overview and Major Events to Date:  ?4/20- admitted ?4/21- transferred to ICU with large volume hemoptysis, hemodynamic instability ?4/22- Bronchoscopy with PCCM, placed on ventilator ?4/23- Second bronchoscopy, patient self-extubated ?4/25- transferred back to FPTS ? ?Assessment and Plan: ?[name] is a 46 y.o. male presenting with hemoptysis, found to have MSSA pneumonia based on BAL. PMH is significant for Mycobacterium avium infection, allergic bronchopulmonary aspergillosis, history of aspergilloma s/p LUL lobectomy, bullous emphysema, hx of massive hemoptysis with IR embolization in 2016, hx of intrabronchial valve placement for pneumothorax in 2019, chronic pulmonary emboli-not on anticoagulation due to hemoptysis, and chronic hepatitis B.  ? ?Hemoptysis  MSSA Pneumonia  Bullous Emphysema  Pulmonary HTN ?Weaned to room air yesterday morning, required 2L with activity and then remained on 2L overnight. Feeling well. Remains with baseline cough but no hemoptysis. Remains on Keflex (finish course tomorrow). Fungal and acid fast culture remain neg.  ?- Will complete Keflex course tomorrow ?- Continue cough suppression with Hycodan ?- Duonebs q6h with q4h PRN ?- Continue BID arformoterol nebs ?- No pulmicort given concern for atrophy/bleed risk ?- Can dc with 2L [place]  ? ?Paroxysmal SVT ?2 self resolving episodes last night, lasting 15-20 seconds each. Reviewed on Tele. Patient reports these were symptomatic (palpitations) and coincident with coughing spells. Per chart review, had similar episode in 2019 (see CV strip from 10/03/2018). Electrolytes have been WNL. Last echo was in 2021 with normal LV function but mildly  enlarged RV with increased RV systolic pressure, consistent with his known pulmonary disease. Serial echo was recommended at that time but he does not appear to have had one since.  ?- Will obtain echo, if no new structural abnormality, can discharge home. If abnormal, will consult cardiology ?- Continue cardiac monitoring ? ?MAC Infection  ABPA with High IgE  Hx Aspergilloma s/p VATS ?- Continue home Azithromycin, Ethambutol, and Cresemba ?- Continue home prednisone 60m daily ? ?Hypothyroidism ?- Continue home Synthroid ? ?Severe protein calorie malnutrition due to loss of muscle mass and chronic illness ?- RD following ?- Mirtazapine 450mQHS ?- MVI ? ?FEN/GI: Regular diet, MiraLAX ?PPx: SCD, high bleed risk ?Dispo:Home  today vs tomorrow . Barriers include Echo.  ? ?Subjective:  ?[name] feeling generally well this morning.  He is eager to go home as soon as possible.  He does report that he had palpitations with the episodes of SVT noted on the monitor last night that he thinks that these were concomitant with coughing spells.  ? ?Objective: ?Temp:  [97.7 ?F (36.5 ?C)-99 ?F (37.2 ?C)] 98.3 ?F (36.8 ?C) (04/27 0455) ?Pulse Rate:  [64-92] 92 (04/27 0455) ?Resp:  [16-18] 17 (04/27 0455) ?BP: (96-117)/(67-81) 108/70 (04/27 0455) ?SpO2:  [90 %-95 %] 90 % (04/27 0455) ?Weight:  [53.4 kg] 53.4 kg (04/27 0500) ?Physical Exam: ?General: Sleeping on arrival, awakens easily to voice, NAD  ?Cardiovascular: Regular rate and rhythm, without murmur, rub, or gallop ?Respiratory: Normal WOB on RA, coarse throughout, non-focal, no wheeze  ?Abdomen: Non-tender, non-distended ?Extremities: Digital clubbing, otherwise no edema ? ?Laboratory: ?Recent Labs  ?Lab 03/12/22 ?0123 03/13/22 ?0793734/26/23 ?0133  ?WBC 11.7* 8.8 7.3  ?HGB 13.1 13.3 12.8*  ?HCT 40.5 41.0 38.1*  ?PLT 179  189 191  ? ?Recent Labs  ?Lab 03/08/22 ?1303 03/10/22 ?0137 03/12/22 ?0123 03/13/22 ?7847 03/14/22 ?0133  ?NA 136   < > 142 138 140  ?K 4.7   < >  4.2 4.1 3.7  ?CL 105   < > 109 101 101  ?CO2 21*   < > 26 27 34*  ?BUN 16   < > _0 ?CREATININE 1.02   < > 0.79 0.69 0.65  ?CALCIUM 8.6*   < > 8.4* 8.1* 8.4*  ?PROT 7.0  --   --   --  6.0*  ?BILITOT 1.0  --   --   --  0.4  ?ALKPHOS 87  --   --   --  58  ?ALT 13  --   --   --  14  ?AST 25  --   --   --  16  ?GLUCOSE 97   < > 105* 84 86  ? < > = values in this interval not displayed.  ? ? ?Imaging/Diagnostic Tests: ?No new imaging, tests ? ?Eppie Gibson, MD ?03/15/2022, 6:57 AM ?PGY-1, [facility] ?[place] Intern pager: [phone], text pages welcome ? ?

## 2022-03-15 NOTE — Discharge Summary (Signed)
Family Medicine Teaching Service ?Hospital Discharge Summary ? ?Patient name: [name] Medical record number: [id number] ?Date of birth: Oct 22, 1976 Age: 46 y.o. Gender: male ?Date of Admission: 03/08/2022  Date of Discharge: 03/15/2022 ?Admitting Physician: Eppie Gibson, MD ? ?Primary Care Provider: Robert Bellow, PA-C ?Consultants: PCCM ? ?Indication for Hospitalization: Hemoptysis ? ?Discharge Diagnoses/Problem List:  ?Principal Problem: ?  MSSA (methicillin susceptible Staphylococcus aureus) pneumonia ([place]) ?Active Problems: ?  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[place]) ?  Mycobacterium avium infection ([place]) ?  Massive hemoptysis, recurrent ?  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  Weight loss ?  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  Protein-calorie malnutrition, severe ?  Bronchiectasis ([place]) ?  Chronic respiratory failure with hypoxia (HCC) ?  Acute respiratory failure ([place]) ?  Acute encephalopathy ?  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  Underweight, BMI < 18.5% ? ? ? ?Disposition: Home ? ?Discharge Condition: Stable ? ?Discharge Exam:  ?From my same-day progress note--  ?General: Sleeping on arrival, awakens easily to voice, NAD  ?Cardiovascular: Regular rate and rhythm, without murmur, rub, or gallop ?Respiratory: Normal WOB on RA, coarse throughout, non-focal, no wheeze  ?Abdomen: Non-tender, non-distended ?Extremities: Digital clubbing, otherwise no edema ?  ? ?Brief Hospital Course:  ?Brief Hospital Course ?[name] is a 46yo male who presented to the [facility] on 4/20 with hemoptysis. His history is significant for Mycobacterium avium infection, allergic bronchopulmonary aspergillosis with high IgE, history of aspergilloma s/p LUL lobectomy, bullous emphysema, and history of massive hemoptysis with IR embolization in 2016. ? ?Hemoptysis  MSSA Pneumonia ?Patient presented to the [facility] ED on 4/20 with the sudden onset of  hemoptysis at home.  He was initially placed on 5 L by EMS but was weaned quickly to room air.  On arrival, his hemoglobin was reassuringly within normal limits at 13.1.  He had a slight leukocytosis to 14.5 with a neutrophilic predominance.  Chest x-ray showed bilateral upper lobe airspace opacities right greater than sign left, CTA raise concern for a masslike area extending from within the posterior aspect of the right upper lobe into the posterior aspect of the right lower lobe.  The CT read is concerned that this may represent a large area of atelectasis versus an underlying neoplastic process.  The patient was maintained on ceftriaxone and azithromycin for CAP coverage.  Patient acutely decompensated on 03/09/2022 possibly secondary to aspiration after an episode of hemoptysis.  He exhibited acute encephalopathic changes and need for nonrebreather as well as new tachycardia.  PCCM was consulted and decided to move him to the ICU from 4/21 to 4/25.  He underwent to bronchoscopies which showed blood in the airways but but no source of active bleeding. BAL showed MSSA.  Patient self extubated on 4/23 and remained hemodynamically stable thereafter. Patient was transitioned to Keflex for MSSA coverage. He was transferred out of the ICU into the family medicine teaching service on 4/25 requiring 4L [place]. He weaned to room air on 4/26 and was discharged with 2L O2 to use as needed with activity until he fully recovers. He will complete his course of Keflex for MSSA pneumonia on 4/28.  ? ?MAC  ABPA with High IgE  Hx Aspergilloma s/p LUL Lobectomy ?Patient was maintained on his home medications of azithromycin, ethambutol, Cresemba and these chronic conditions were not thought to be contributing to his presentation.  ? ?Paroxysmal SVT ?Patient developed paroxysmal SVT on 4/26.  He had three episodes  nonsustained episodes (~15 seconds) overnight that were symptomatic with palpitations. Per patient report, these occurred  in the context of coughing spells. Echocardiogram was obtained which showed LVEF 50-55%, mild LVH. Mildly reduced RH systolic function but normal pulmonary artery systolic pressure. We suspect that symptoms were secondary to acute illness and addition of LABA ([place]) to his hospital medication regimen.  ? ?Protein Calorie Malnutrition, severe ?Patient noted to be very thin on arrival. Registered dietician was consulted with recommendations for PO supplementation and MVI. ? ?Items for PCP Follow-up ?1) Per last pulm note, patient needed to get BMI >19 to qualify for possible lung transplant, please work with him on strategies for weight gain as he would greatly benefit from possible lung transplant in the future. ?2) Patient developed SVT during hospitalization as discussed above. ? ?Significant Procedures: Intubation 4/22, extubation 4/23 ? ?Significant Labs and Imaging:  ?Recent Labs  ?Lab 03/13/22 ?0722 03/14/22 ?0133 03/15/22 ?0092  ?WBC 8.8 7.3 9.3  ?HGB 13.3 12.8* 12.8*  ?HCT 41.0 38.1* 38.5*  ?PLT 189 191 200  ? ?Recent Labs  ?Lab 03/08/22 ?1303 03/10/22 ?0137 03/10/22 ?1404 03/10/22 ?1505 03/11/22 ?3300 03/11/22 ?1700 03/12/22 ?0123 03/13/22 ?7622 03/14/22 ?0133 03/15/22 ?6333  ?NA 136 139   < >  --  138  --  142 138 140 139  ?K 4.7 3.8   < >  --  3.9  --  4.2 4.1 3.7 3.8  ?CL 105 103  --   --  107  --  109 101 101 98  ?CO2 21* 28  --   --  24  --  26 27 34* 34*  ?GLUCOSE 97 111*  --   --  170*  --  105* 84 86 103*  ?BUN 16 11  --   --  16  --  _0 ?CREATININE 1.02 1.14  --   --  0.80  --  0.79 0.69 0.65 0.83  ?CALCIUM 8.6* 8.0*  --   --  8.0*  --  8.4* 8.1* 8.4* 8.8*  ?MG  --  2.2  --  2.1 2.1 2.0 2.1  --   --   --   ?PHOS  --   --   --  2.5 2.5 2.7  --   --   --   --   ?ALKPHOS 87  --   --   --   --   --   --   --  58  --   ?AST 25  --   --   --   --   --   --   --  16  --   ?ALT 13  --   --   --   --   --   --   --  14  --   ?ALBUMIN 3.6  --   --   --   --   --   --   --  2.8*  --   ? < > = values  in this interval not displayed.  ? ? ?ECHOCARDIOGRAM COMPLETE ?   ECHOCARDIOGRAM REPORT   ? ?  ? ?Patient Name:   DANTONIO JUSTEN Date of Exam: 03/15/2022 ?Medical Rec #:  [id number]   Height:       68.0 in ?Accession #:    [id number]  Weight:       117.7 lb ?Date of Birth:  11-04-1976   BSA:  1.632 m? ?Patient Age:    13 years    BP:           108/70 mmHg ?Patient Gender: M           HR:           92 bpm. ?Exam Location:  Inpatient ? ?Procedure: 2D Echo, Cardiac Doppler, Color Doppler and Strain Analysis ? ?Indications:    SVT ?  ?History:        Patient has prior history of Echocardiogram examinations, most ?                recent 06/23/2020. Signs/Symptoms:Dyspnea. ?  ?Sonographer:    TAMARA CROWN RDCS ?Referring Phys: [place] ? ?IMPRESSIONS ? ? 1. Left ventricular ejection fraction, by estimation, is 50 to 55%. The left ventricle has low normal function. The left ventricle has no regional wall motion abnormalities. There is mild left ventricular hypertrophy. Left ventricular diastolic  ?parameters were grossly normal. The average left ventricular global longitudinal strain is -16.0 %. The global longitudinal strain is abnormal. ? 2. Right ventricular systolic function is mildly reduced. The right ventricular size is normal. There is normal pulmonary artery systolic pressure. The estimated right ventricular systolic pressure is 02.7 mmHg. ? 3. The mitral valve is normal in structure. Trivial mitral valve regurgitation. No evidence of mitral stenosis. ? 4. The aortic valve is tricuspid. Aortic valve regurgitation is trivial. No aortic stenosis is present. ? 5. Aortic dilatation noted. There is borderline dilatation of the aortic root, measuring 38 mm. ? 6. The inferior vena cava is normal in size with greater than 50% respiratory variability, suggesting right atrial pressure of 3 mmHg. ? ?FINDINGS ? Left Ventricle: Left ventricular ejection fraction, by estimation, is 50 to 55%. The left ventricle has  low normal function. The left ventricle has no regional wall motion abnormalities. The average left ventricular global longitudinal strain is -16.0  ?%. The global longitudinal strain is abnormal. The left

## 2022-03-15 NOTE — Discharge Instructions (Addendum)
Dear [name], ? ?Thank you for letting us participate in your care. You were hospitalized for coughing up blood and diagnosed with MSSA (methicillin susceptible Staphylococcus aureus) pneumonia ([place]). You were treated with antibiotics and nebulized TXA (tranexamic acid) to help stop the bleeding. You also had an abnormal heart rhythm called Supraventricular tachycardia (SVT). An ultrasound of your heart was quite normal, we suspect this was due to one of the medications you were on here in the hospital. ? ?POST-HOSPITAL & CARE INSTRUCTIONS ?Given your high risk of future bleeding, you should always come to the hospital for evaluation if you find you are coughing up blood like you were on admission.  ?We recommend waiting at least 1 week before you start using your hypertonic saline nebulizer again as it can increase your risk of bleeding. ?We are sending you home with a stronger, prescription cough syrup ?Go to your follow up appointments (listed below) ? ? ?DOCTOR'S APPOINTMENT   ?Future Appointments  ?Date Time Provider [place]  ?04/04/2022  3:45 PM [name], Lavell Islam, MD [facility]  ? ? ? ?Take care and be well! ? ?Family Medicine Teaching Service Inpatient Team ?[place]  ?[facility]  ?[address] [place], [place] [address] ?[phone] ? ? ? ? ?

## 2022-03-19 ENCOUNTER — Telehealth: Payer: Self-pay | Admitting: Internal Medicine

## 2022-03-19 ENCOUNTER — Other Ambulatory Visit (HOSPITAL_COMMUNITY): Payer: Self-pay

## 2022-03-20 ENCOUNTER — Other Ambulatory Visit (HOSPITAL_COMMUNITY): Payer: Self-pay

## 2022-03-20 MED ORDER — PREDNISONE 10 MG PO TABS: 10.0000 mg | ORAL_TABLET | Freq: Every day | ORAL | 3 refills | Status: DC

## 2022-03-20 NOTE — Telephone Encounter (Signed)
Rx for pt's prednisone has been sent to preferred pharmacy for pt. Called and spoke with pt letting him know this had been done and he verbalized understanding. Nothing further needed. ?

## 2022-03-21 ENCOUNTER — Other Ambulatory Visit (HOSPITAL_COMMUNITY): Payer: Self-pay

## 2022-03-24 DIAGNOSIS — J479 Bronchiectasis, uncomplicated: Secondary | ICD-10-CM | POA: Diagnosis not present

## 2022-03-29 ENCOUNTER — Other Ambulatory Visit (HOSPITAL_COMMUNITY): Payer: Self-pay

## 2022-03-29 DIAGNOSIS — R0902 Hypoxemia: Secondary | ICD-10-CM | POA: Diagnosis not present

## 2022-03-29 DIAGNOSIS — A312 Disseminated mycobacterium avium-intracellulare complex (DMAC): Secondary | ICD-10-CM | POA: Diagnosis not present

## 2022-03-29 DIAGNOSIS — B449 Aspergillosis, unspecified: Secondary | ICD-10-CM | POA: Diagnosis not present

## 2022-03-29 DIAGNOSIS — B4481 Allergic bronchopulmonary aspergillosis: Secondary | ICD-10-CM | POA: Diagnosis not present

## 2022-04-04 ENCOUNTER — Ambulatory Visit: Payer: BC Managed Care – PPO | Admitting: Adult Health

## 2022-04-04 ENCOUNTER — Ambulatory Visit (INDEPENDENT_AMBULATORY_CARE_PROVIDER_SITE_OTHER): Payer: BC Managed Care – PPO

## 2022-04-04 ENCOUNTER — Other Ambulatory Visit (HOSPITAL_COMMUNITY): Payer: Self-pay

## 2022-04-04 ENCOUNTER — Other Ambulatory Visit: Payer: Self-pay

## 2022-04-04 ENCOUNTER — Encounter: Payer: Self-pay | Admitting: Adult Health

## 2022-04-04 ENCOUNTER — Ambulatory Visit: Payer: BC Managed Care – PPO | Admitting: Infectious Disease

## 2022-04-04 VITALS — BP 108/62 | HR 69 | Temp 98.1°F | Ht 68.0 in | Wt 112.8 lb

## 2022-04-04 DIAGNOSIS — Z23 Encounter for immunization: Secondary | ICD-10-CM | POA: Diagnosis not present

## 2022-04-04 DIAGNOSIS — A310 Pulmonary mycobacterial infection: Secondary | ICD-10-CM

## 2022-04-04 DIAGNOSIS — B181 Chronic viral hepatitis B without delta-agent: Secondary | ICD-10-CM

## 2022-04-04 DIAGNOSIS — E43 Unspecified severe protein-calorie malnutrition: Secondary | ICD-10-CM

## 2022-04-04 DIAGNOSIS — L570 Actinic keratosis: Secondary | ICD-10-CM | POA: Diagnosis not present

## 2022-04-04 DIAGNOSIS — J181 Lobar pneumonia, unspecified organism: Secondary | ICD-10-CM | POA: Diagnosis not present

## 2022-04-04 DIAGNOSIS — J15211 Pneumonia due to Methicillin susceptible Staphylococcus aureus: Secondary | ICD-10-CM | POA: Diagnosis not present

## 2022-04-04 DIAGNOSIS — R042 Hemoptysis: Secondary | ICD-10-CM

## 2022-04-04 DIAGNOSIS — J439 Emphysema, unspecified: Secondary | ICD-10-CM

## 2022-04-04 DIAGNOSIS — B440 Invasive pulmonary aspergillosis: Secondary | ICD-10-CM

## 2022-04-04 DIAGNOSIS — B449 Aspergillosis, unspecified: Secondary | ICD-10-CM

## 2022-04-04 DIAGNOSIS — B4481 Allergic bronchopulmonary aspergillosis: Secondary | ICD-10-CM

## 2022-04-04 DIAGNOSIS — L820 Inflamed seborrheic keratosis: Secondary | ICD-10-CM | POA: Diagnosis not present

## 2022-04-04 DIAGNOSIS — J189 Pneumonia, unspecified organism: Secondary | ICD-10-CM | POA: Diagnosis not present

## 2022-04-04 MED ORDER — AZITHROMYCIN 500 MG PO TABS
500.0000 mg | ORAL_TABLET | Freq: Every day | ORAL | 11 refills | Status: DC
  Filled 2022-04-04: qty 30, 22d supply, fill #0

## 2022-04-04 MED ORDER — ETHAMBUTOL HCL 400 MG PO TABS
ORAL_TABLET | ORAL | 5 refills | Status: DC
  Filled 2022-04-04: qty 225, 90d supply, fill #0

## 2022-04-04 MED ORDER — CRESEMBA 186 MG PO CAPS
2.0000 | ORAL_CAPSULE | Freq: Every day | ORAL | 11 refills | Status: DC
  Filled 2022-04-04: qty 60, 30d supply, fill #0
  Filled 2022-04-17: qty 56, 28d supply, fill #0
  Filled 2022-05-24: qty 56, 28d supply, fill #1
  Filled 2022-06-29: qty 56, 28d supply, fill #2

## 2022-04-04 NOTE — Progress Notes (Signed)
? ?  Covid-19 Vaccination Clinic ? ?Name:  [name]    ?MRN: [id number] ?DOB: 02-18-1976 ? ?04/04/2022 ? ?Mr. [name] was observed post Covid-19 immunization for 15 minutes without incident. He was provided with Vaccine Information Sheet and instruction to access the V-Safe system.  ? ?Mr. [name] was instructed to call 911 with any severe reactions post vaccine: ?Difficulty breathing  ?Swelling of face and throat  ?A fast heartbeat  ?A bad rash all over body  ?Dizziness and weakness  ? ?  ?Varney Daily, PharmD ?PGY1 Pharmacy Resident ? ?Please check AMION for all [facility] pharmacy phone numbers ?After 10:00 PM call main pharmacy [phone] ? ?

## 2022-04-04 NOTE — Patient Instructions (Addendum)
Chest xray today .  ?Continue on BREZTRI 2 puffs Twice daily  , rinse after use.  ?Prednisone 35m daily  ?Flutter valve Three times a day  .  ?Please eat often,  High protein food and drinks.  ?Continue on Fasenra and Xolair .  ?Continue with ID follow up.  ?Continue on Oxygen 2l/m with activity and At bedtime, to keep O2 sats >88-90%.  ?Albuterol inhaler As needed  Wheezing/shortness of breath .  ?Albuterol neb every 6hrs as needed for wheezing /shortness of breath as needed.  ?Robitussin DM As needed  Cough/congestion  ?CT chest in 4 weeks  ?Restart Ensure Twice daily   ?Follow up with Dr. RChase Calleror Brett Laswell NP in 4-6 weeks  and As needed   ?Please contact office for sooner follow up if symptoms do not improve or worsen or seek emergency care  ? ? ?
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
## 2022-04-04 NOTE — Progress Notes (Signed)
_0  ID: [name], male    DOB: 07-09-1976, 46 y.o.   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 with prednisone 10 mg daily, Xolair and Fasenra History of MAC followed by infectious disease on ethambutol and azithromycin History of tension pneumothorax in October 2019 history of pleurodesis and endobronchial valves History of chronic hep B  TEST/EVENTS :  7/13 Ig E 800s ( ~4000 2012 )  12/10/11 Sputum AFB  Smear and culture negative (prelim) 12/10/11 Fungal sputum culture - negative (final) 01/30/2012 spirometry - fev1 1.7L/41%, ratip 52 - BEST EVER 01/30/2012 walking desaturation test: 185 feet x 3 laps: did not deaturate  05/2014 >Spiro fev1 1.7L/40%, ratio 55  Spirometry 06/25/2017 showed FEV1 at 28%, ratio 46, FVC 50%. CT chest July 2021 showed chronic narrowing and occlusion of several of the right upper lobe segmental and subsegmental pulmonary arteries additional filling defects in the left upper lobe segmental and subsegmental branches.  Extensive bullous disease.  Architectural distortion most suggestive of chronic embolus/occlusion, right atrial enlargement , Groundglass and consolidative opacity in the left lower and right lower periphery   Sputum culture July 2021 + for Klebsiella pneumonia -pansensitive except for ampicillin.   August 2021 2D echo showed EF 17%, grade 1 diastolic dysfunction mildly elevated pulmonary artery systolic pressure at 39 mmHg   Events  2010 Dx with MAI >Azithro/Ethambutol 2012 Dx with Aspergilloma >VATS  >started on Voriconazaol  2012 Dx w/ ABPA w/ High IgE >4000>started on prednisone  2013 tried off azithr/Etham but developed fever, Cx neg but restarted on rx.  2016 Hospitalzed with hemoptysis >IR guided embolization . FOB/BAL + Saccharomyces on fungal fx . Serum Galactomannan was neg.  [name] evaluation 04/2017 for Transplant -rejected. Felt to early for transplant - recommended pre-transplant weight goal 128 lbs for a BMI > 19  04/04/2022 Follow up : Asthma, ABPA, aspergilloma, MAC, post hospital follow-up Patient returns for a posthospital follow-up.  Patient was last seen in the office June 2022.  Patient has a very complicated medical history with a history of severe persistent asthma with allergic phenotype, ABPA, aspergilloma and back.  He is followed by the infectious disease team. Patient was recently admitted last month for severe hemoptysis and acute respiratory distress.  Patient did require intubation and vent support.  He underwent bronchoscopy with no evidence of active bleeding.  But did notice some blood along the right side of the lung. Treated with tranexamic acid .  Was treated for MSSA pneumonia ,  BAL showed Staph aureus (resistant to Cipro clindamycin, erythromycin and Bactrim).  AFB was negative.  Fungal culture was negative.  Cytology showed no malignant cells.  Numerous acute inflammatory cells.  Patient says since discharge he is feeling some better.  Remains weak with low energy.  He has had no further hemoptysis.  Weight continues to be decreased.  Patient says he is eating but his appetite is not as good.  Weight is down to 112 pounds with a BMI of 17.  Patient continues to work full-time at [employer]. He has follow-up with infectious disease later today. He remains on  flutter valve 3 times daily.  Uses Breztri inhaler twice daily.  Remains on chronic steroids with prednisone 10 mg daily.   Allergies  Allergen Reactions   Clarithromycin Other (See Comments)    Adverse  reaction w/ voriconazole    Rifaximin Other (See Comments)    Adverse reaction w/ voriconazole     Immunization History  Administered Date(s) Administered   DTaP 11/19/2010   Influenza Split 10/29/2011, 08/14/2012   Influenza Whole 09/19/2010   Influenza,inj,Quad PF,6+ Mos 11/23/2013, 09/28/2014, 09/06/2015, 08/27/2016, 08/10/2017, 09/09/2018, 08/06/2019, 08/10/2020, 09/27/2021   PFIZER Comirnaty(Gray Top)Covid-19 Tri-Sucrose Vaccine 12/28/2020   PFIZER(Purple Top)SARS-COV-2 Vaccination 02/18/2020, 03/14/2020, 08/10/2020   Pfizer Covid-19 Vaccine Bivalent Booster 77yr & up 10/04/2021   Pneumococcal Conjugate-13 04/29/2017, 04/29/2017   Pneumococcal Polysaccharide-23 12/17/2010    Past Medical History:  Diagnosis Date   Anemia    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[place]    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   CAP (community acquired pneumonia) 03/09/2022   Chronic viral hepatitis B without delta-agent ([place] 11/23/2020   Drug rash 12/16/2017   Dyspnea    Esophageal reflux    very rare   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   Lung disease, bullous ([facility]    MAI (mycobacterium avium-intracellulare) ([place]    Mold exposure 12/05/2015   Photosensitivity dermatitis 09/16/2017   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[place] 03/14/2022   Rib fracture 02/08/2021   Solar lentigo 08/08/2015   Tension pneumothorax 09/04/2018   Vaccine counseling 12/28/2020   Weight loss 10/15/2016    Tobacco History: Social History   Tobacco Use  Smoking Status Former   Packs/day: 0.70   Years: 19.00   Pack years: 13.30   Types: Cigarettes   Quit date: 11/20/2007   Years since quitting: 14.3  Smokeless Tobacco Former   Types: Snuff   Quit date: 11/19/1994   Counseling given: Not Answered   Outpatient Medications Prior to Visit  Medication Sig Dispense Refill   albuterol (PROVENTIL) (2.5 MG/3ML) 0.083% nebulizer solution TAKE 3 ML BY NEBULIZATION EVERY 6 HOURS AS NEEDED FOR WHEEZING OR SHORTNESS  OF BREATH (Patient taking differently: Take 2.5 mg by nebulization every 6 (six) hours.) 75 mL 12   albuterol (VENTOLIN HFA) 108 (90 Base) MCG/ACT inhaler Inhale 2 puffs into the lungs every 6 (six) hours as needed. (Patient taking differently: Inhale 2 puffs into the lungs every 6 (six) hours as needed for wheezing or shortness of breath.) 18 g 2   Benralizumab (FASENRA PEN) 30 MG/ML SOAJ Inject 1 mL (30 mg total) into the skin every 8 (eight) weeks. 1 mL 2   BREZTRI AEROSPHERE 160-9-4.8 MCG/ACT AERO INHALE 2 PUFFS BY MOUTH TWICE DAILY (Patient taking differently: Inhale 2 puffs into the lungs 2 (two) times daily.) 10.7 g 5   CALCIUM CARBONATE-VITAMIN D PO Take 2 tablets by mouth every morning.     chlorpheniramine-HYDROcodone 10-8 MG/5ML Take 5 mLs by mouth every 12 (twelve) hours as needed for cough. 115 mL 0   ethambutol (MYAMBUTOL) 400 MG tablet TAKE 2 AND 1/2 TABLETS BY MOUTH ONCE DAILY (Patient taking differently: Take 1,000 mg by mouth every morning.) 598 tablet 5   feeding supplement, ENSURE ENLIVE, (ENSURE ENLIVE) LIQD Take 237 mLs by mouth 3 (three) times daily between meals. (Patient taking differently: Take 237 mLs by mouth 2 (two) times daily as needed (meal supplement).) 237 mL 12   ferrous sulfate 325 (65 FE) MG tablet Take 325 mg by mouth every morning.  folic acid (FOLVITE) 1 MG tablet Take 1 mg by mouth every other day.     Isavuconazonium Sulfate (CRESEMBA) 186 MG CAPS TAKE 2 CAPSULES BY MOUTH DAILY (Patient taking differently: Take 2 capsules by mouth every evening. After work) 60 capsule 11   levothyroxine (SYNTHROID) 100 MCG tablet Take 100 mcg by mouth daily before breakfast.     Menthol-Methyl Salicylate (SALONPAS PAIN RELIEF PATCH EX) Place 1-2 patches onto the skin daily. Back pain     mirtazapine (REMERON SOL-TAB) 45 MG disintegrating tablet Take 45 mg by mouth at bedtime.     omalizumab (XOLAIR) 150 MG/ML prefilled syringe INJECT 300 MG INTO THE SKIN EVERY 14 (FOURTEEN)  DAYS. IN ADDITION TO 75MG (TOTAL DOSE IS 375MG EVERY 14 DAYS). (Patient taking differently: 300 mg every 14 (fourteen) days. Take with a 75 mg injection for a total dose of 375 mg) 4 mL 1   omalizumab (XOLAIR) 75 MG/0.5ML prefilled syringe INJECT 75 MG INTO THE SKIN EVERY 14 (FOURTEEN) DAYS. IN ADDITION TO 300MG (TOTAL DOSE IS 375MG EVERY 14 DAYS). DELIVER TO PATIENT'S HOME. (Patient taking differently: 75 mg every 14 (fourteen) days. Take with two 150 mg injections for a total dose of 375 mg) 1 mL 1   OXYGEN Inhale 3 L into the lungs as needed (shortness of breath).     predniSONE (DELTASONE) 10 MG tablet Take 1 tablet (10 mg total) by mouth daily with breakfast. 90 tablet 3   sodium chloride HYPERTONIC 3 % nebulizer solution Take by nebulization daily. Dx: J47.9 (Patient taking differently: Take 4 mLs by nebulization daily. Dx: J47.9) 120 mL 11   azithromycin (ZITHROMAX) 500 MG tablet Take 1 tablet (500 mg total) by mouth daily. (Patient not taking: Reported on 04/04/2022) 30 tablet 11   EPINEPHrine 0.3 mg/0.3 mL IJ SOAJ injection Inject 0.3 mg into the muscle once for 1 dose. (Patient taking differently: Inject 0.3 mg into the muscle once as needed for anaphylaxis.) 1 each 11   No facility-administered medications prior to visit.     Review of Systems:   Constitutional:   No  weight loss, night sweats,  Fevers, chills,  +fatigue, or  lassitude.  HEENT:   No headaches,  Difficulty swallowing,  Tooth/dental problems, or  Sore throat,                No sneezing, itching, ear ache, + nasal congestion, post nasal drip,   CV:  No chest pain,  Orthopnea, PND, swelling in lower extremities, anasarca, dizziness, palpitations, syncope.   GI  No heartburn, indigestion, abdominal pain, nausea, vomiting, diarrhea, change in bowel habits, loss of appetite, bloody stools.   Resp:   No chest wall deformity  Skin: no rash or lesions.  GU: no dysuria, change in color of urine, no urgency or frequency.   No flank pain, no hematuria   MS:  No joint pain or swelling.  No decreased range of motion.  No back pain.    Physical Exam  BP 108/62 (BP Location: Left Arm, Cuff Size: Normal)   Pulse 69   Temp 98.1 F (36.7 C) (Temporal)   Ht _0  (1.727 m)   Wt 112 lb 12.8 oz (51.2 kg)   SpO2 94%   BMI 17.15 kg/m   GEN: A/Ox3; pleasant , NAD, thin and cachectic   HEENT:  [place]/AT,   NOSE-clear, THROAT-clear, no lesions, no postnasal drip or exudate noted.   NECK:  Supple w/ fair ROM; no JVD; normal carotid  impulses w/o bruits; no thyromegaly or nodules palpated; no lymphadenopathy.    RESP scattered rhonchi  no accessory muscle use, no dullness to percussion  CARD:  RRR, no m/r/g, no peripheral edema, pulses intact, no cyanosis or clubbing.  GI:   Soft & nt; nml bowel sounds; no organomegaly or masses detected.   Musco: Warm bil, no deformities or joint swelling noted.   Neuro: alert, no focal deficits noted.    Skin: Warm, no lesions or rashes    Lab Results:    BMET       Imaging: DG Chest 1 View  Result Date: 03/09/2022 CLINICAL DATA:  Shortness of breath. EXAM: CHEST  1 VIEW COMPARISON:  March 08, 2022 FINDINGS: The heart size and mediastinal contours are within normal limits. The lungs are hyperinflated. Chronic architectural distortion and bullous emphysema are noted within the bilateral apices. Stable bilateral upper lobe airspace opacities are seen, right greater than left. No pneumothorax is identified. The visualized skeletal structures are unremarkable. IMPRESSION: Stable bilateral upper lobe airspace opacities, right greater than left, concerning for bilateral multifocal infiltrates. Electronically Signed   By: Virgina Norfolk M.D.   On: 03/09/2022 20:27   DG Chest 2 View  Result Date: 03/08/2022 CLINICAL DATA:  Shortness of breath. History of pulmonary aspergillosis EXAM: CHEST - 2 VIEW COMPARISON:  02/21/2021 FINDINGS: Stable cardiomediastinal contours.  Aortic atherosclerosis. Chronic architectural distortion and bullous emphysematous changes at the lung apices. Worsening bilateral upper lobe airspace opacities, right worse than left. No large pleural fluid collection. No evidence of pneumothorax. IMPRESSION: Worsening bilateral upper lobe airspace opacities, right worse than left. Appearance concerning for multifocal pneumonia. Electronically Signed   By: Davina Poke D.O.   On: 03/08/2022 13:03   CT Angio Chest Pulmonary Embolism (PE) W or WO Contrast  Result Date: 03/08/2022 CLINICAL DATA:  Shortness of breath and hemoptysis. EXAM: CT ANGIOGRAPHY CHEST WITH CONTRAST TECHNIQUE: Multidetector CT imaging of the chest was performed using the standard protocol during bolus administration of intravenous contrast. Multiplanar CT image reconstructions and MIPs were obtained to evaluate the vascular anatomy. RADIATION DOSE REDUCTION: This exam was performed according to the departmental dose-optimization program which includes automated exposure control, adjustment of the mA and/or kV according to patient size and/or use of iterative reconstruction technique. CONTRAST:  67m OMNIPAQUE IOHEXOL 350 MG/ML SOLN COMPARISON:  June 04, 2020 FINDINGS: Cardiovascular: There is mild calcification of the aortic arch, without evidence of aortic aneurysm. The upper lobe branches of the bilateral pulmonary arteries are poorly identified, likely secondary to architectural distortion. No evidence of pulmonary embolism. Normal heart size. No pericardial effusion. Mediastinum/Nodes: No enlarged mediastinal, hilar, or axillary lymph nodes. Thyroid gland, trachea, and esophagus demonstrate no significant findings. Lungs/Pleura: Predominantly paraseptal emphysematous lung disease is seen with extensive chronic bullous changes noted. This is most prominent within the bilateral upper lobes and bilateral apices, with thick, septated bullae seen within this region. A 4.0 cm x 2.2 cm x  8.0 cm mass-like area is seen extending from within the posterior aspect of a large right upper lobe bullae into the posterior aspect of the right lower lobe (axial CT image 26 through 74, CT series 6). This represents a new finding when compared to the prior study. A mild amount of atelectasis is seen within the anterior and posterior aspects of the left upper lobe. Small areas of focal scarring and/or atelectasis are seen within the right lower lobe. Subcentimeter calcified lung nodules are seen bilaterally. There is no evidence  of a pleural effusion or pneumothorax. Upper Abdomen: A 1.3 cm gallstone is seen within the lumen of an otherwise normal-appearing gallbladder. Musculoskeletal: No chest wall abnormality. No acute or significant osseous findings. Review of the MIP images confirms the above findings. IMPRESSION: 1. No evidence of pulmonary embolism. 2. Predominantly paraseptal emphysematous lung disease with extensive chronic bullous changes. 3. 4.0 cm x 2.2 cm x 8.0 cm mass-like area extending from within the posterior aspect of a large right upper lobe bullae into the posterior aspect of the right lower lobe. While this may represent a large area of atelectasis, the presence of an underlying neoplastic process cannot be excluded. Correlation with nuclear medicine PET/CT is recommended. 4. Cholelithiasis. Aortic Atherosclerosis (ICD10-I70.0) and Emphysema (ICD10-J43.9). Electronically Signed   By: Virgina Norfolk M.D.   On: 03/08/2022 16:07   DG CHEST PORT 1 VIEW  Result Date: 03/10/2022 CLINICAL DATA:  Enteric and endotracheal tube placement. EXAM: PORTABLE CHEST 1 VIEW COMPARISON:  03/09/2022 chest radiograph FINDINGS: Cardiomediastinal silhouette is unchanged. An endotracheal tube with tip 3 cm above the carina and enteric tube with tip overlying the UPPER mediastinum noted. Bilateral UPPER lung opacities/scarring again noted. Emphysema changes are again noted. No pleural effusion identified.  IMPRESSION: Endotracheal tube with tip 3 cm above the carina and enteric tube with tip overlying the UPPER mediastinum. Recommend enteric tube repositioning. No other significant change. Electronically Signed   By: Margarette Canada M.D.   On: 03/10/2022 13:39   DG Abd Portable 1V  Result Date: 03/10/2022 CLINICAL DATA:  OG tube placement. EXAM: PORTABLE ABDOMEN - 1 VIEW COMPARISON:  Prior radiographs FINDINGS: An enteric tube is noted with tip overlying the mid stomach. Visualized bowel gas pattern is unremarkable. IMPRESSION: Enteric tube with tip overlying the mid stomach. Electronically Signed   By: Margarette Canada M.D.   On: 03/10/2022 13:41   DG Abd Portable 1V  Result Date: 03/10/2022 CLINICAL DATA:  Endotracheal tube and OG tube placement. EXAM: PORTABLE ABDOMEN - 1 VIEW COMPARISON:  03/09/2022 radiographs FINDINGS: Endotracheal tube tip lies 2.8 cm above the carina and OG tube tip overlies the UPPER mediastinum. Bilateral UPPER lung pulmonary opacities/scarring and emphysema again noted. No other significant change. IMPRESSION: 1. Endotracheal tube tip lies 2.8 cm above the carina and OG tube tip overlies the UPPER mediastinum. Recommend repositioning of OG tube. 2. Unchanged bilateral UPPER lung pulmonary opacities/scarring and emphysema. Electronically Signed   By: Margarette Canada M.D.   On: 03/10/2022 13:40   EEG adult  Result Date: 03/10/2022 Greta Doom, MD     03/10/2022 12:35 AM History: 46 year old male being evaluated for altered mental status Sedation: None Technique: This stat EEG was acquired with electrodes placed according to the International 10-20 electrode system (including Fp1, Fp2, F3, F4, C3, C4, P3, P4, O1, O2, T3, T4, T5, T6, A1, A2, Fz, Cz, Pz). The following electrodes were missing or displaced: none. Background: The background consists of intermixed alpha and beta activities. There is a well defined posterior dominant rhythm of 8-9 Hz that attenuates with eye opening.  With  drowsiness, there is an anterior shifting of the posterior dominant rhythm.  Sleep is recorded with normal appearing structures. Photic stimulation: Physiologic driving is not performed EEG Abnormalities: None Clinical Interpretation: This normal EEG is recorded in the waking and sleep state. There was no seizure or seizure predisposition recorded on this study. Please note that lack of epileptiform activity on EEG does not preclude the possibility of epilepsy. Roland Rack,  MD [facility] [phone] If 7pm- 7am, please page neurology on call as listed in [place].  ECHOCARDIOGRAM COMPLETE  Result Date: 03/15/2022    ECHOCARDIOGRAM REPORT   Patient Name:   [name] Date of Exam: 03/15/2022 Medical Rec #:  [id number]   Height:       68.0 in Accession #:    [id number]  Weight:       117.7 lb Date of Birth:  14-Sep-1976   BSA:          1.632 m Patient Age:    54 years    BP:           108/70 mmHg Patient Gender: M           HR:           92 bpm. Exam Location:  Inpatient Procedure: 2D Echo, Cardiac Doppler, Color Doppler and Strain Analysis Indications:    SVT  History:        Patient has prior history of Echocardiogram examinations, most                 recent 06/23/2020. Signs/Symptoms:Dyspnea.  Sonographer:    Luisa Hart RDCS Referring Phys: [place]  1. Left ventricular ejection fraction, by estimation, is 50 to 55%. The left ventricle has low normal function. The left ventricle has no regional wall motion abnormalities. There is mild left ventricular hypertrophy. Left ventricular diastolic parameters were grossly normal. The average left ventricular global longitudinal strain is -16.0 %. The global longitudinal strain is abnormal.  2. Right ventricular systolic function is mildly reduced. The right ventricular size is normal. There is normal pulmonary artery systolic pressure. The estimated right ventricular systolic pressure is 56.8 mmHg.  3. The mitral valve is  normal in structure. Trivial mitral valve regurgitation. No evidence of mitral stenosis.  4. The aortic valve is tricuspid. Aortic valve regurgitation is trivial. No aortic stenosis is present.  5. Aortic dilatation noted. There is borderline dilatation of the aortic root, measuring 38 mm.  6. The inferior vena cava is normal in size with greater than 50% respiratory variability, suggesting right atrial pressure of 3 mmHg. FINDINGS  Left Ventricle: Left ventricular ejection fraction, by estimation, is 50 to 55%. The left ventricle has low normal function. The left ventricle has no regional wall motion abnormalities. The average left ventricular global longitudinal strain is -16.0 %. The global longitudinal strain is abnormal. The left ventricular internal cavity size was normal in size. There is mild left ventricular hypertrophy. Left ventricular diastolic parameters were normal. Right Ventricle: The right ventricular size is normal. No increase in right ventricular wall thickness. Right ventricular systolic function is mildly reduced. There is normal pulmonary artery systolic pressure. The tricuspid regurgitant velocity is 2.79 m/s, and with an assumed right atrial pressure of 3 mmHg, the estimated right ventricular systolic pressure is 12.7 mmHg. Left Atrium: Left atrial size was normal in size. Right Atrium: Right atrial size was normal in size. Pericardium: There is no evidence of pericardial effusion. Mitral Valve: The mitral valve is normal in structure. Trivial mitral valve regurgitation. No evidence of mitral valve stenosis. Tricuspid Valve: The tricuspid valve is normal in structure. Tricuspid valve regurgitation is mild . No evidence of tricuspid stenosis. Aortic Valve: The aortic valve is tricuspid. Aortic valve regurgitation is trivial. No aortic stenosis is present. Aortic valve mean gradient measures 2.0 mmHg. Aortic valve peak gradient measures 3.7 mmHg. Aortic valve area, by VTI measures 4.53  cm.  Pulmonic Valve: The pulmonic valve was normal in structure. Pulmonic valve regurgitation is trivial. No evidence of pulmonic stenosis. Aorta: The aortic root is normal in size and structure and aortic dilatation noted. There is borderline dilatation of the aortic root, measuring 38 mm. Venous: The inferior vena cava is normal in size with greater than 50% respiratory variability, suggesting right atrial pressure of 3 mmHg. IAS/Shunts: No atrial level shunt detected by color flow Doppler.  LEFT VENTRICLE PLAX 2D LVIDd:         3.10 cm     Diastology LVIDs:         2.00 cm     LV e' medial:    9.25 cm/s LV PW:         1.20 cm     LV E/e' medial:  5.4 LV IVS:        1.20 cm     LV e' lateral:   13.10 cm/s LVOT diam:     2.50 cm     LV E/e' lateral: 3.8 LV SV:         64 LV SV Index:   39          2D Longitudinal Strain LVOT Area:     4.91 cm    2D Strain GLS Avg:     -16.0 %  LV Volumes (MOD) LV vol d, MOD A2C: 52.9 ml LV vol d, MOD A4C: 49.4 ml LV vol s, MOD A2C: 26.8 ml LV vol s, MOD A4C: 22.7 ml LV SV MOD A2C:     26.1 ml LV SV MOD A4C:     49.4 ml LV SV MOD BP:      27.4 ml RIGHT VENTRICLE RV Basal diam:  3.50 cm RV Mid diam:    2.30 cm RV S prime:     15.40 cm/s TAPSE (M-mode): 1.6 cm LEFT ATRIUM             Index        RIGHT ATRIUM          Index LA diam:        1.90 cm 1.16 cm/m   RA Area:     9.89 cm LA Vol (A2C):   23.5 ml 14.40 ml/m  RA Volume:   20.30 ml 12.44 ml/m LA Vol (A4C):   46.1 ml 28.25 ml/m LA Biplane Vol: 32.9 ml 20.16 ml/m  AORTIC VALVE                    PULMONIC VALVE AV Area (Vmax):    4.36 cm     PV Vmax:          0.76 m/s AV Area (Vmean):   3.89 cm     PV Vmean:         52.900 cm/s AV Area (VTI):     4.53 cm     PV VTI:           0.125 m AV Vmax:           96.10 cm/s   PV Peak grad:     2.3 mmHg AV Vmean:          66.750 cm/s  PV Mean grad:     1.0 mmHg AV VTI:            0.141 m      PR End Diast Vel: 7.84 msec AV Peak Grad:      3.7 mmHg AV Mean Grad:  2.0 mmHg LVOT Vmax:          85.40 cm/s LVOT Vmean:        52.900 cm/s LVOT VTI:          0.130 m LVOT/AV VTI ratio: 0.92  AORTA Ao Root diam: 3.84 cm Ao Asc diam:  3.60 cm MITRAL VALVE               TRICUSPID VALVE MV Area (PHT): 3.91 cm    TR Peak grad:   31.1 mmHg MV Decel Time: 194 msec    TR Vmax:        279.00 cm/s MV E velocity: 49.50 cm/s MV A velocity: 63.10 cm/s  SHUNTS MV E/A ratio:  0.78        Systemic VTI:  0.13 m                            Systemic Diam: 2.50 cm Cherlynn Kaiser MD Electronically signed by Cherlynn Kaiser MD Signature Date/Time: 03/15/2022/10:50:36 AM    Final    VAS [place] LOWER EXTREMITY VENOUS (DVT)  Result Date: 03/11/2022  Lower Venous DVT Study Patient Name:  [name]  Date of Exam:   03/10/2022 Medical Rec #: [id number]    Accession #:    [id number] Date of Birth: Jul 15, 1976    Patient Gender: M Patient Age:   1 years Exam Location:  [facility] Procedure:      VAS [place] LOWER EXTREMITY VENOUS (DVT) Referring Phys: Jenny Reichmann PAYNE --------------------------------------------------------------------------------  Indications: SOB.  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 Limitations: Body habitus (BMI 19.01). Comparison Study: No prior study on file Performing Technologist: Sharion Dove RVS  Examination Guidelines: A complete evaluation includes B-mode imaging, spectral Doppler, color Doppler, and power Doppler as needed of all accessible portions of each vessel. Bilateral testing is considered an integral part of a complete examination. Limited examinations for reoccurring indications may be performed as noted. The reflux portion of the exam is performed with the patient in reverse Trendelenburg.  +---------+---------------+---------+-----------+----------+---------------+ RIGHT    CompressibilityPhasicitySpontaneityPropertiesThrombus Aging  +---------+---------------+---------+-----------+----------+---------------+ CFV      Full           Yes      Yes                                   +---------+---------------+---------+-----------+----------+---------------+ SFJ      Full                                                         +---------+---------------+---------+-----------+----------+---------------+ FV Prox  Full                                                         +---------+---------------+---------+-----------+----------+---------------+ FV Mid   Full                                                         +---------+---------------+---------+-----------+----------+---------------+  FV DistalFull                                                         +---------+---------------+---------+-----------+----------+---------------+ PFV      Full                                                         +---------+---------------+---------+-----------+----------+---------------+ POP      Full           Yes      Yes                                  +---------+---------------+---------+-----------+----------+---------------+ PTV                                                   patent by color +---------+---------------+---------+-----------+----------+---------------+   +---------+---------------+---------+-----------+----------+--------------+ LEFT     CompressibilityPhasicitySpontaneityPropertiesThrombus Aging +---------+---------------+---------+-----------+----------+--------------+ CFV      Full           Yes      Yes                                 +---------+---------------+---------+-----------+----------+--------------+ SFJ      Full                                                        +---------+---------------+---------+-----------+----------+--------------+ FV Prox  Full                                                        +---------+---------------+---------+-----------+----------+--------------+ FV Mid   Full                                                         +---------+---------------+---------+-----------+----------+--------------+ FV DistalFull                                                        +---------+---------------+---------+-----------+----------+--------------+ PFV      Full                                                        +---------+---------------+---------+-----------+----------+--------------+  POP      Full           Yes      Yes                                 +---------+---------------+---------+-----------+----------+--------------+ PTV      Full                                                        +---------+---------------+---------+-----------+----------+--------------+ PERO     Full                                                        +---------+---------------+---------+-----------+----------+--------------+     Summary: BILATERAL: - No evidence of deep vein thrombosis seen in the lower extremities, bilaterally. -No evidence of popliteal cyst, bilaterally.   *See table(s) above for measurements and observations. Electronically signed by Jamelle Haring on 03/11/2022 at 11:16:35 AM.    Final          Latest Ref Rng & Units 11/30/2020   11:13 AM 06/25/2017    9:11 AM 08/06/2016    9:09 AM  PFT Results  FVC-Pre L 2.09   2.52   2.84    FVC-Predicted Pre % 47   50   61    FVC-Post L 2.16    2.93    FVC-Predicted Post % 49    63    Pre FEV1/FVC % % 56   46   53    Post FEV1/FCV % % 56    56    FEV1-Pre L 1.17   1.16   1.50    FEV1-Predicted Pre % 33   28   39    FEV1-Post L 1.22    1.63    DLCO uncorrected ml/min/mmHg 15.81    18.56    DLCO UNC% % 53    59    DLCO corrected ml/min/mmHg 16.67    18.35    DLCO COR %Predicted % 56    59    DLVA Predicted % 123    108    TLC L   5.14    TLC % Predicted %   76    RV % Predicted %   129      No results found for: NITRICOXIDE      Assessment & Plan:   No problem-specific Assessment & Plan notes found for this  encounter.     Rexene Edison, NP 04/04/2022

## 2022-04-05 ENCOUNTER — Other Ambulatory Visit (HOSPITAL_COMMUNITY): Payer: Self-pay

## 2022-04-06 ENCOUNTER — Telehealth: Payer: Self-pay | Admitting: Adult Health

## 2022-04-06 NOTE — Assessment & Plan Note (Signed)
Continue on current regimen with infectious disease.  Recent AFB cultures were negative.

## 2022-04-06 NOTE — Progress Notes (Signed)
ATC x2.  LVM to return call.

## 2022-04-06 NOTE — Assessment & Plan Note (Signed)
Recent hospitalization with bronchiectatic flare and MSSA pneumonia.  Hemoptysis has resolved.  Nothing further since discharge.  Underwent bronchoscopy during hospitalization with no source of active bleeding. Continue to monitor closely

## 2022-04-06 NOTE — Assessment & Plan Note (Signed)
Patient continues to have ongoing weight loss.  Encouraged on high-protein diet.  Add back Ensure 1-2 times daily.

## 2022-04-06 NOTE — Telephone Encounter (Signed)
I called the patient and I gave him the result and he voices understanding.  Nothing further needed.

## 2022-04-06 NOTE — Assessment & Plan Note (Signed)
Continue on current regimen .   

## 2022-04-06 NOTE — Assessment & Plan Note (Signed)
MSSA pneumonia with recent hospitalization now clinically starting to improve after antibiotics.  We will check chest x-ray today.  Continue on current maintenance regimen with mucociliary clearance  Plan  Patient Instructions  Chest xray today .  Continue on BREZTRI 2 puffs Twice daily  , rinse after use.  Prednisone 47m daily  Flutter valve Three times a day  .  Please eat often,  High protein food and drinks.  Continue on Fasenra and Xolair .  Continue with ID follow up.  Continue on Oxygen 2l/m with activity and At bedtime, to keep O2 sats >88-90%.  Albuterol inhaler As needed  Wheezing/shortness of breath .  Albuterol neb every 6hrs as needed for wheezing /shortness of breath as needed.  Robitussin DM As needed  Cough/congestion  CT chest in 4 weeks  Restart Ensure Twice daily   Follow up with Dr. RChase Calleror Brett Overholt NP in 4-6 weeks  and As needed   Please contact office for sooner follow up if symptoms do not improve or worsen or seek emergency care

## 2022-04-09 ENCOUNTER — Other Ambulatory Visit (HOSPITAL_COMMUNITY): Payer: Self-pay

## 2022-04-10 LAB — FUNGAL ORGANISM REFLEX

## 2022-04-10 LAB — FUNGUS CULTURE WITH STAIN

## 2022-04-10 LAB — FUNGUS CULTURE RESULT

## 2022-04-11 ENCOUNTER — Other Ambulatory Visit (HOSPITAL_COMMUNITY): Payer: Self-pay

## 2022-04-11 ENCOUNTER — Ambulatory Visit
Admission: RE | Admit: 2022-04-11 | Discharge: 2022-04-11 | Disposition: A | Discharge status: Home / Self Care | Payer: BC Managed Care – PPO | Source: Ambulatory Visit | Attending: Infectious Disease | Admitting: Infectious Disease

## 2022-04-11 DIAGNOSIS — B1910 Unspecified viral hepatitis B without hepatic coma: Secondary | ICD-10-CM | POA: Diagnosis not present

## 2022-04-11 DIAGNOSIS — D508 Other iron deficiency anemias: Secondary | ICD-10-CM | POA: Diagnosis not present

## 2022-04-11 DIAGNOSIS — A310 Pulmonary mycobacterial infection: Secondary | ICD-10-CM | POA: Diagnosis not present

## 2022-04-11 DIAGNOSIS — K8020 Calculus of gallbladder without cholecystitis without obstruction: Secondary | ICD-10-CM | POA: Diagnosis not present

## 2022-04-11 DIAGNOSIS — B181 Chronic viral hepatitis B without delta-agent: Secondary | ICD-10-CM

## 2022-04-11 DIAGNOSIS — F329 Major depressive disorder, single episode, unspecified: Secondary | ICD-10-CM | POA: Diagnosis not present

## 2022-04-12 ENCOUNTER — Other Ambulatory Visit (HOSPITAL_COMMUNITY): Payer: Self-pay

## 2022-04-17 ENCOUNTER — Other Ambulatory Visit: Payer: Self-pay | Admitting: Internal Medicine

## 2022-04-17 ENCOUNTER — Other Ambulatory Visit (HOSPITAL_COMMUNITY): Payer: Self-pay

## 2022-04-17 DIAGNOSIS — J4550 Severe persistent asthma, uncomplicated: Secondary | ICD-10-CM

## 2022-04-18 ENCOUNTER — Other Ambulatory Visit (HOSPITAL_COMMUNITY): Payer: Self-pay

## 2022-04-18 MED ORDER — OMALIZUMAB 75 MG/0.5ML ~~LOC~~ SOSY
PREFILLED_SYRINGE | SUBCUTANEOUS | 5 refills | Status: DC
  Filled 2022-04-19: qty 1, 28d supply, fill #0
  Filled 2022-05-11: qty 1, 28d supply, fill #1
  Filled 2022-06-07 – 2022-06-18 (×2): qty 1, 28d supply, fill #2
  Filled 2022-07-17: qty 1, 28d supply, fill #3
  Filled 2022-08-13: qty 1, 28d supply, fill #4
  Filled 2022-09-06 – 2022-11-23 (×2): qty 1, 28d supply, fill #5

## 2022-04-18 MED ORDER — OMALIZUMAB 150 MG/ML ~~LOC~~ SOSY
PREFILLED_SYRINGE | SUBCUTANEOUS | 5 refills | Status: DC
  Filled 2022-04-19: qty 4, 28d supply, fill #0
  Filled 2022-05-11: qty 4, 28d supply, fill #1
  Filled 2022-06-07 – 2022-06-18 (×2): qty 4, 28d supply, fill #2
  Filled 2022-07-17: qty 4, 28d supply, fill #3
  Filled 2022-08-13: qty 4, 28d supply, fill #4
  Filled 2022-09-06 – 2022-11-23 (×3): qty 4, 28d supply, fill #5

## 2022-04-18 NOTE — Telephone Encounter (Signed)
Refill sent for [facility] to [place]: [phone]   Dose: 375 mg every 2 weeks  Last OV: 04/04/22 Provider:[name]  Next OV: 05/09/22  Knox Saliva, PharmD, MPH, BCPS Clinical Pharmacist (Rheumatology and Pulmonology)

## 2022-04-19 ENCOUNTER — Other Ambulatory Visit (HOSPITAL_COMMUNITY): Payer: Self-pay

## 2022-04-24 DIAGNOSIS — J479 Bronchiectasis, uncomplicated: Secondary | ICD-10-CM | POA: Diagnosis not present

## 2022-04-24 LAB — ACID FAST CULTURE WITH REFLEXED SENSITIVITIES (MYCOBACTERIA): Acid Fast Culture: NEGATIVE

## 2022-04-25 ENCOUNTER — Other Ambulatory Visit: Payer: Self-pay | Admitting: Internal Medicine

## 2022-04-25 ENCOUNTER — Other Ambulatory Visit (HOSPITAL_COMMUNITY): Payer: Self-pay

## 2022-04-25 DIAGNOSIS — J4550 Severe persistent asthma, uncomplicated: Secondary | ICD-10-CM

## 2022-04-25 MED ORDER — FASENRA PEN 30 MG/ML ~~LOC~~ SOAJ
30.0000 mg | SUBCUTANEOUS | 2 refills | Status: DC
  Filled 2022-04-25 – 2022-06-07 (×2): qty 1, 56d supply, fill #0
  Filled 2022-07-26: qty 1, 56d supply, fill #1
  Filled 2022-09-06: qty 1, 30d supply, fill #2
  Filled 2022-11-14 (×2): qty 1, 56d supply, fill #2

## 2022-04-25 NOTE — Telephone Encounter (Signed)
Refill sent for [facility] to [place]: [phone]   Dose: 30 mg SQ every 8 weeks  Last OV: 04/04/22 Provider: [name], Tammy Parrett  Next OV: 05/09/22  Knox Saliva, PharmD, MPH, BCPS Clinical Pharmacist (Rheumatology and Pulmonology)

## 2022-04-26 ENCOUNTER — Other Ambulatory Visit (HOSPITAL_COMMUNITY): Payer: Self-pay

## 2022-04-29 DIAGNOSIS — B4481 Allergic bronchopulmonary aspergillosis: Secondary | ICD-10-CM | POA: Diagnosis not present

## 2022-04-29 DIAGNOSIS — A312 Disseminated mycobacterium avium-intracellulare complex (DMAC): Secondary | ICD-10-CM | POA: Diagnosis not present

## 2022-04-29 DIAGNOSIS — B449 Aspergillosis, unspecified: Secondary | ICD-10-CM | POA: Diagnosis not present

## 2022-04-29 DIAGNOSIS — R0902 Hypoxemia: Secondary | ICD-10-CM | POA: Diagnosis not present

## 2022-05-02 ENCOUNTER — Ambulatory Visit (HOSPITAL_COMMUNITY)
Admission: RE | Admit: 2022-05-02 | Discharge: 2022-05-02 | Disposition: A | Discharge status: Home / Self Care | Payer: BC Managed Care – PPO | Source: Ambulatory Visit | Attending: Adult Health | Admitting: Adult Health

## 2022-05-02 DIAGNOSIS — J189 Pneumonia, unspecified organism: Secondary | ICD-10-CM | POA: Diagnosis not present

## 2022-05-02 DIAGNOSIS — R911 Solitary pulmonary nodule: Secondary | ICD-10-CM | POA: Diagnosis not present

## 2022-05-02 DIAGNOSIS — J439 Emphysema, unspecified: Secondary | ICD-10-CM | POA: Diagnosis not present

## 2022-05-02 DIAGNOSIS — J181 Lobar pneumonia, unspecified organism: Secondary | ICD-10-CM | POA: Insufficient documentation

## 2022-05-03 ENCOUNTER — Other Ambulatory Visit (HOSPITAL_COMMUNITY): Payer: BC Managed Care – PPO

## 2022-05-08 ENCOUNTER — Other Ambulatory Visit: Payer: Self-pay | Admitting: Internal Medicine

## 2022-05-08 DIAGNOSIS — J4550 Severe persistent asthma, uncomplicated: Secondary | ICD-10-CM

## 2022-05-09 ENCOUNTER — Encounter: Payer: Self-pay | Admitting: Adult Health

## 2022-05-09 ENCOUNTER — Ambulatory Visit (INDEPENDENT_AMBULATORY_CARE_PROVIDER_SITE_OTHER): Payer: BC Managed Care – PPO | Admitting: Adult Health

## 2022-05-09 VITALS — BP 136/74 | HR 85 | Temp 98.6°F | Ht 68.0 in | Wt 109.6 lb

## 2022-05-09 DIAGNOSIS — B440 Invasive pulmonary aspergillosis: Secondary | ICD-10-CM

## 2022-05-09 DIAGNOSIS — R042 Hemoptysis: Secondary | ICD-10-CM

## 2022-05-09 DIAGNOSIS — J4550 Severe persistent asthma, uncomplicated: Secondary | ICD-10-CM | POA: Diagnosis not present

## 2022-05-09 DIAGNOSIS — B4481 Allergic bronchopulmonary aspergillosis: Secondary | ICD-10-CM

## 2022-05-09 DIAGNOSIS — A310 Pulmonary mycobacterial infection: Secondary | ICD-10-CM

## 2022-05-09 DIAGNOSIS — J479 Bronchiectasis, uncomplicated: Secondary | ICD-10-CM

## 2022-05-09 DIAGNOSIS — E43 Unspecified severe protein-calorie malnutrition: Secondary | ICD-10-CM

## 2022-05-09 DIAGNOSIS — R5381 Other malaise: Secondary | ICD-10-CM

## 2022-05-09 DIAGNOSIS — J9611 Chronic respiratory failure with hypoxia: Secondary | ICD-10-CM

## 2022-05-09 NOTE — Patient Instructions (Addendum)
Continue on BREZTRI 2 puffs Twice daily  , rinse after use.  Prednisone 56m daily  Flutter valve Three times a day  .  Please eat often,  High protein food and drinks. -try high calorie boost.  Continue on Fasenra and Xolair .  Continue with ID follow up.  Continue on Oxygen 2l/m with activity and At bedtime, to keep O2 sats >88-90%.  Evaluate for POC .  Albuterol inhaler As needed  Wheezing/shortness of breath .  Albuterol neb every 6hrs as needed for wheezing /shortness of breath as needed.  Hypertonic nebs 1-2 twice daily  Robitussin DM As needed  Cough/congestion  Sputum cultures (AFB, fungal and sputum culture) Follow up with Dr. RChase Calleror Krystyne Tewksbury NP in 6 -8 weeks  and As needed   Please contact office for sooner follow up if symptoms do not improve or worsen or seek emergency care

## 2022-05-09 NOTE — Progress Notes (Signed)
_0  ID: [name], male    DOB: 11/22/1975, 46 y.o.   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  Chronic respiratory failure on oxygen (started in February 2022) History of recurrent hemoptysis with severe episode in 2016 requiring interventional guided embolization and in April 2023 with respiratory distress requiring intubation. 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 with prednisone 10 mg daily, Xolair and Fasenra History of MAC followed by infectious disease on ethambutol and azithromycin History of tension pneumothorax in 2019 status post pleurodesis and endobronchial valves History of chronic hep B   TEST/EVENTS :  7/13 Ig E 800s ( ~4000 2012 )  12/10/11 Sputum AFB  Smear and culture negative (prelim) 12/10/11 Fungal sputum culture - negative (final) 01/30/2012 spirometry - fev1 1.7L/41%, ratip 52 - BEST EVER 01/30/2012 walking desaturation test: 185 feet x 3 laps: did not deaturate  05/2014 >Spiro fev1 1.7L/40%, ratio 55  Spirometry 06/25/2017 showed FEV1 at 28%, ratio 46, FVC 50%. CT chest July 2021 showed chronic narrowing and occlusion of several of the right upper lobe segmental and subsegmental pulmonary arteries additional filling defects in the left upper lobe segmental and subsegmental branches.  Extensive bullous disease.  Architectural distortion most suggestive of chronic embolus/occlusion, right atrial enlargement , Groundglass and consolidative opacity in the left lower and right lower periphery   Sputum culture July 2021 + for Klebsiella pneumonia -pansensitive except for ampicillin.   August 2021 2D echo showed EF 94%, grade 1 diastolic dysfunction mildly elevated  pulmonary artery systolic pressure at 39 mmHg   Events  2010 Dx with MAI >Azithro/Ethambutol 2012 Dx with Aspergilloma >VATS >started on Voriconazaol  2012 Dx w/ ABPA w/ High IgE >4000>started on prednisone  2013 tried off azithr/Etham but developed fever, Cx neg but restarted on rx.  2016 Hospitalzed with hemoptysis >IR guided embolization . FOB/BAL + Saccharomyces on fungal fx . Serum Galactomannan was neg.  [name] evaluation 04/2017 for Transplant -rejected. Felt to early for transplant - recommended pre-transplant weight goal 128 lbs for a BMI > 19  05/09/2022 Follow up ; severe persistent asthma, ABPA, aspergilloma, MAC, hemoptysis, chronic respiratory failure Patient returns for 1 month follow-up.  Patient was seen last visit for a posthospital follow-up.  He had a very complicated hospitalization with severe hemoptysis and acute respiratory distress.  He did require intubation and vent support.  He underwent bronchoscopy with no evidence of active bleeding.  He was treated with Tranexamic acid .  Found to have MSSA pneumonia.  BAL showed Staph aureus (resistant to Cipro, clindamycin, erythromycin and Bactrim) AFB was negative.  Fungal cultures were negative.  Cytology showed no malignant cells.  Numerous acute inflammatory cells. Patient says he is doing about the same he has had no recurrence of hemoptysis but continues to feel very weak low energy decreased activity tolerance and endurance.  He remains on oxygen 2 L with activity and at bedtime.  Feels that he needs oxygen more during the daytime when he is doing things especially at work.  Patient works part-time at [employer].  He typically can work about 20 to 25 hours.  But is very hard on him.  He wears out very easily Patient is trying to eat a high-protein diet.  He is starting to use Ensure but continues to have weight loss.  Current weight is at 109 pounds.  Patient has applied for disability but for some reason has not been approved  despite having significant comorbidities and struggles daily.  Patient says he needs a portable oxygen tank he has a trouble carrying his E tanks to work.  Today in office patient O2 saturations are 87% on room air.  Requires 2 L pulsed oxygen on portable POC to maintain O2 saturations greater than 88 to 90%. Patient lives alone and is able to drive.  Gets winded easily.  Patient does have parents that are close by better not in good health. Patient continues to do triple therapy with Breztri inhaler.  He is on chronic steroids with prednisone 10 mg daily.  He is using his flutter valve.  Is unable to use vest therapy due to severe back and rib pain with use.  Patient is using hypertonic nebs 1-2 daily.  Continues to have a daily cough multiple times throughout the day with thick mucus.  Patient says he is able to get up the mucus easily but it is very thick and productive. CT chest done on May 02, 2022 showed very large bullae at the lung apices, previous debris and filling defect redness resolved in the right apical bullae.  This does appear to be improved from previous scan.  Along the inferior extension into the right lower lobe with some cavitation and internal debris and material with increased gas component compared to previous scan.  Measuring 4.8 x 2.0 cm previous was 4.5 x 2.4 cm.  Left apical bullae with debris and density mostly gas-filled bullae represents progressive superinfection of the bulla.  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  We discussed today referral back to [facility] for consideration of lung transplant he has been there previously also referral for second opinion patient declines at this time.  Patient says that the process is very expensive and time-consuming.  He prefers to stay locally at present time. Patient says when he went for lung transplant work-up previously he  did not feel like the benefits outweigh the risk and cost.    Allergies  Allergen Reactions   Clarithromycin Other (See Comments)    Adverse reaction w/ voriconazole    Rifaximin Other (See Comments)    Adverse reaction w/ voriconazole     Immunization History  Administered Date(s) Administered   DTaP 11/19/2010   Influenza Split 10/29/2011, 08/14/2012   Influenza Whole 09/19/2010   Influenza,inj,Quad PF,6+ Mos 11/23/2013, 09/28/2014, 09/06/2015, 08/27/2016, 08/10/2017, 09/09/2018, 08/06/2019, 08/10/2020, 09/27/2021   PFIZER Comirnaty(Gray Top)Covid-19 Tri-Sucrose Vaccine 12/28/2020   PFIZER(Purple Top)SARS-COV-2 Vaccination 02/18/2020, 03/14/2020, 08/10/2020   Pfizer Covid-19 Vaccine Bivalent Booster 47yr & up 10/04/2021, 04/04/2022   Pneumococcal Conjugate-13 04/29/2017, 04/29/2017   Pneumococcal Polysaccharide-23 12/17/2010    Past Medical History:  Diagnosis Date   Anemia    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[facility]    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[facility] 03/14/2022   CAP (community acquired pneumonia) 03/09/2022   Chronic viral hepatitis B without delta-agent ([facility] 11/23/2020   Drug rash 12/16/2017   Dyspnea    Esophageal reflux    very rare   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   Lung disease, bullous ([facility]    MAI (mycobacterium avium-intracellulare) ([facility]    Mold exposure 12/05/2015   Photosensitivity dermatitis 09/16/2017   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[place] 03/14/2022  Rib fracture 02/08/2021   Solar lentigo 08/08/2015   Tension pneumothorax 09/04/2018   Vaccine counseling 12/28/2020   Weight loss 10/15/2016    Tobacco History: Social History   Tobacco Use  Smoking Status Former   Packs/day: 0.70   Years: 19.00   Total pack years: 13.30   Types: Cigarettes   Quit date: 11/20/2007   Years since quitting: 14.4  Smokeless Tobacco Former   Types: Snuff   Quit date: 11/19/1994   Counseling given: Not Answered   Outpatient Medications Prior to Visit   Medication Sig Dispense Refill   albuterol (PROVENTIL) (2.5 MG/3ML) 0.083% nebulizer solution USE 3 ML VIA NEBULIZER EVERY 6 HOURS AS NEEDED FOR WHEEZING OR SHORTNESS OF BREATH 75 mL 12   albuterol (VENTOLIN HFA) 108 (90 Base) MCG/ACT inhaler Inhale 2 puffs into the lungs every 6 (six) hours as needed. (Patient taking differently: Inhale 2 puffs into the lungs every 6 (six) hours as needed for wheezing or shortness of breath.) 18 g 2   azithromycin (ZITHROMAX) 500 MG tablet Take 1 tablet (500 mg total) by mouth daily. 30 tablet 11   Benralizumab (FASENRA PEN) 30 MG/ML SOAJ Inject 1 mL (30 mg total) into the skin every 8 (eight) weeks. 1 mL 2   BREZTRI AEROSPHERE 160-9-4.8 MCG/ACT AERO INHALE 2 PUFFS BY MOUTH TWICE DAILY (Patient taking differently: Inhale 2 puffs into the lungs 2 (two) times daily.) 10.7 g 5   CALCIUM CARBONATE-VITAMIN D PO Take 2 tablets by mouth every morning.     chlorpheniramine-HYDROcodone 10-8 MG/5ML Take 5 mLs by mouth every 12 (twelve) hours as needed for cough. 115 mL 0   EPINEPHrine 0.3 mg/0.3 mL IJ SOAJ injection ADMINISTER 0.3 MG IN THE MUSCLE 1 TIME FOR 1 DOSE 2 each 3   ethambutol (MYAMBUTOL) 400 MG tablet TAKE 2 AND 1/2 TABLETS BY MOUTH ONCE DAILY 598 tablet 5   feeding supplement, ENSURE ENLIVE, (ENSURE ENLIVE) LIQD Take 237 mLs by mouth 3 (three) times daily between meals. (Patient taking differently: Take 237 mLs by mouth 2 (two) times daily as needed (meal supplement).) 237 mL 12   ferrous sulfate 325 (65 FE) MG tablet Take 325 mg by mouth every morning.     folic acid (FOLVITE) 1 MG tablet Take 1 mg by mouth every other day.     Isavuconazonium Sulfate (CRESEMBA) 186 MG CAPS TAKE 2 CAPSULES BY MOUTH DAILY 60 capsule 11   levothyroxine (SYNTHROID) 100 MCG tablet Take 100 mcg by mouth daily before breakfast.     Menthol-Methyl Salicylate (SALONPAS PAIN RELIEF PATCH EX) Place 1-2 patches onto the skin daily. Back pain     mirtazapine (REMERON SOL-TAB) 45 MG  disintegrating tablet Take 45 mg by mouth at bedtime.     omalizumab (XOLAIR) 150 MG/ML prefilled syringe INJECT 300 MG INTO THE SKIN EVERY 14 (FOURTEEN) DAYS. IN ADDITION TO 75MG (TOTAL DOSE IS 375MG EVERY 14 DAYS). 4 mL 5   omalizumab (XOLAIR) 75 MG/0.5ML prefilled syringe INJECT 75 MG INTO THE SKIN EVERY 14 (FOURTEEN) DAYS. IN ADDITION TO 300MG (TOTAL DOSE IS 375MG EVERY 14 DAYS). DELIVER TO PATIENT'S HOME. 1 mL 5   OXYGEN Inhale 3 L into the lungs as needed (shortness of breath).     predniSONE (DELTASONE) 10 MG tablet Take 1 tablet (10 mg total) by mouth daily with breakfast. 90 tablet 3   sodium chloride HYPERTONIC 3 % nebulizer solution USE NEBULIZEER DAILY AS DIRECTED 750 mL 5   No facility-administered medications prior  to visit.     Review of Systems:   Constitutional:   No  weight loss, night sweats,  Fevers, chills,  +fatigue, or  lassitude.  HEENT:   No headaches,  Difficulty swallowing,  Tooth/dental problems, or  Sore throat,                No sneezing, itching, ear ache,  +nasal congestion, post nasal drip,   CV:  No chest pain,  Orthopnea, PND, swelling in lower extremities, anasarca, dizziness, palpitations, syncope.   GI  No heartburn, indigestion, abdominal pain, nausea, vomiting, diarrhea, change in bowel habits, loss of appetite, bloody stools.   Resp: .  No chest wall deformity  Skin: no rash or lesions.  GU: no dysuria, change in color of urine, no urgency or frequency.  No flank pain, no hematuria   MS:  No joint pain or swelling.  No decreased range of motion.  No back pain.    Physical Exam  BP 136/74 (BP Location: Left Arm, Cuff Size: Normal)   Pulse 85   Temp 98.6 F (37 C) (Temporal)   Ht _0  (1.727 m)   Wt 109 lb 9.6 oz (49.7 kg)   SpO2 100%   BMI 16.66 kg/m   GEN: A/Ox3; pleasant , NAD, well nourished    HEENT:  [place]/AT,  NOSE-clear, THROAT-clear, no lesions, no postnasal drip or exudate noted.   NECK:  Supple w/ fair ROM; no JVD;  normal carotid impulses w/o bruits; no thyromegaly or nodules palpated; no lymphadenopathy.    RESP  Clear  P & A; w/o, wheezes/ rales/ or rhonchi. no accessory muscle use, no dullness to percussion  CARD:  RRR, no m/r/g, no peripheral edema, pulses intact, no cyanosis or clubbing.  GI:   Soft & nt; nml bowel sounds; no organomegaly or masses detected.   Musco: Warm bil, no deformities or joint swelling noted.   Neuro: alert, no focal deficits noted.    Skin: Warm, no lesions or rashes    Lab Results:      Imaging: CT Chest Wo Contrast  Result Date: 05/03/2022 CLINICAL DATA:  Pneumonia. History of aspergilloma and allergic bronchopulmonary aspergillosis as well as mycobacterium avium intracellular of the lungs. Chronic hepatitis B. history of prior inter bronchial valve placement. EXAM: CT CHEST WITHOUT CONTRAST TECHNIQUE: Multidetector CT imaging of the chest was performed following the standard protocol without IV contrast. RADIATION DOSE REDUCTION: This exam was performed according to the departmental dose-optimization program which includes automated exposure control, adjustment of the mA and/or kV according to patient size and/or use of iterative reconstruction technique. COMPARISON:  Multiple exams, including 03/08/2022 FINDINGS: Cardiovascular: Atherosclerotic calcification of the aortic arch. Mediastinum/Nodes: No current pathologic adenopathy. Lungs/Pleura: Emphysema. Very large bulla at the lung apices with some thick margination probably related to chronic inflammation and potentially atypical infection. In the dominant posterior right apical bulla, much of the previous debris and filling defect has resolved with only a bandlike inferior filling defect observed for example on image 49 series 4. This constitutes an overall improvement. This has an inferior extension or contiguous component into the right lower lobe with some cavitation and internal debris and material, increased  gas component compared to previous, currently this process measures 4.8 by 2.0 cm on image 64 series 4, previously 4.5 by 2.4 cm. Extensive scattered scarring and extensive punctate scattered calcifications in the lungs. Increased debris and density in the previously mostly gas-filled bulla anteriorly at the left lung apex, for  example on image 30 of series 4. This probably represents progressive superinfection of this bulla. The dominant left apical posterior bulla has inferior extension thought to probably be continuous with anterior bronchi of the left lower lobe and also posterior bronchi of the left lower lobe. There is scattered airway plugging in the left lower lobe roughly similar to previous. Faint mosaic attenuation at the lung bases similar to prior. Scattered reticulonodular opacities in the left lower lobe. Upper Abdomen: Unremarkable Musculoskeletal: Thoracic kyphosis with stable anterior wedging at T3, T4, T5, T7, and T8. IMPRESSION: 1. Chronic severe pulmonary findings with extensive cavitation particularly in the upper lobes, emphysema with extensive bullous disease, scattered scarring, and scattered infectious nodularity and airway plugging. Compared to the April 20th exam, there are some evolutionary findings including reduced debris/increase cavitation of the dominant right posterior apical nodule which is continuous with a superior segment right lower lobe lesion which likewise demonstrates progressive cavitation. There is some increased complexity in fluid in the anterior left apical bulla likely from atypical infection, and the large posterior apical bulla appears to have inferior components which are directly connected to different bronchi of the left lower lobe. 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 2. Aortic Atherosclerosis (ICD10-I70.0) and Emphysema (ICD10-J43.9).  Electronically Signed   By: Van Clines M.D.   On: 05/03/2022 17:01   US Abdomen Limited RUQ (LIVER/GB)  Result Date: 04/11/2022 CLINICAL DATA:  Hepatitis-B EXAM: ULTRASOUND ABDOMEN LIMITED RIGHT UPPER QUADRANT COMPARISON:  Abdominal ultrasound 10/18/2021 FINDINGS: Gallbladder: 2 cm gallstone. Abnormal wall thickening or pericholecystic fluid. Negative sonographic Murphy's sign. Common bile duct: Diameter: 3 mm Liver: Mildly increased echogenicity of the parenchyma with no focal mass identified. Portal vein is patent on color Doppler imaging with normal direction of blood flow towards the liver. Other: None. IMPRESSION: 1. No hepatic mass identified. 2. Cholelithiasis. Electronically Signed   By: Ofilia Neas M.D.   On: 04/11/2022 11:24         Latest Ref Rng & Units 11/30/2020   11:13 AM 06/25/2017    9:11 AM 08/06/2016    9:09 AM  PFT Results  FVC-Pre L 2.09  2.52  2.84   FVC-Predicted Pre % 47  50  61   FVC-Post L 2.16   2.93   FVC-Predicted Post % 49   63   Pre FEV1/FVC % % 56  46  53   Post FEV1/FCV % % 56   56   FEV1-Pre L 1.17  1.16  1.50   FEV1-Predicted Pre % 33  28  39   FEV1-Post L 1.22   1.63   DLCO uncorrected ml/min/mmHg 15.81   18.56   DLCO UNC% % 53   59   DLCO corrected ml/min/mmHg 16.67   18.35   DLCO COR %Predicted % 56   59   DLVA Predicted % 123   108   TLC L   5.14   TLC % Predicted %   76   RV % Predicted %   129     No results found for: "NITRICOXIDE"      Assessment & Plan:   No problem-specific Assessment & Plan notes found for this encounter.     Rexene Edison, NP 05/09/2022

## 2022-05-10 ENCOUNTER — Other Ambulatory Visit: Payer: BC Managed Care – PPO

## 2022-05-10 DIAGNOSIS — R5381 Other malaise: Secondary | ICD-10-CM | POA: Insufficient documentation

## 2022-05-10 DIAGNOSIS — J479 Bronchiectasis, uncomplicated: Secondary | ICD-10-CM | POA: Diagnosis not present

## 2022-05-10 NOTE — Assessment & Plan Note (Signed)
Continue on current regimen.  Now on Fasenra and Xolair.  Patient has been on chronic steroids since 2012.  Continue on prednisone 10 mg daily.  Plan  Patient Instructions  Continue on BREZTRI 2 puffs Twice daily  , rinse after use.  Prednisone 15m daily  Flutter valve Three times a day  .  Please eat often,  High protein food and drinks. -try high calorie boost.  Continue on Fasenra and Xolair .  Continue with ID follow up.  Continue on Oxygen 2l/m with activity and At bedtime, to keep O2 sats >88-90%.  Evaluate for POC .  Albuterol inhaler As needed  Wheezing/shortness of breath .  Albuterol neb every 6hrs as needed for wheezing /shortness of breath as needed.  Hypertonic nebs 1-2 twice daily  Robitussin DM As needed  Cough/congestion  Sputum cultures (AFB, fungal and sputum culture) Follow up with Dr. RChase Calleror Aariona Momon NP in 6 -8 weeks  and As needed   Please contact office for sooner follow up if symptoms do not improve or worsen or seek emergency care

## 2022-05-10 NOTE — Assessment & Plan Note (Signed)
This has resolved.  Patient's had no recurrent hemoptysis since discharge

## 2022-05-10 NOTE — Assessment & Plan Note (Signed)
Patient is encouraged on a high-calorie high-protein diet.  Continue with supplements

## 2022-05-10 NOTE — Assessment & Plan Note (Signed)
Continue on chronic regimen with infectious disease.  Recent AFBs were negative.  Repeat cultures today.

## 2022-05-10 NOTE — Assessment & Plan Note (Signed)
Continue on oxygen maintain O2 saturation greater than 88 to 90%.  Patient needs a portable lightweight oxygen concentrator.  Today in the office he qualified for a POC was able to maintain O2 saturations greater than 88 to 90% on 2 L of pulsed oxygen. Order for POC sent to homecare company

## 2022-05-10 NOTE — Assessment & Plan Note (Signed)
Patient has chronic lung disease, emphysema severe persistent asthma, he is very physically deconditioned.  Refer to pulmonary rehab

## 2022-05-10 NOTE — Assessment & Plan Note (Signed)
Severe persistent asthma with allergic phenotype patient has significant symptom burden, complicated by ABPA, MAC and aspergilloma He is on maximum therapy with Breztri inhaler Continue with mucociliary clearance  Continue on Fasenra and Xolair. Continue on chronic steroids prednisone 10 mg daily  Plan  Patient Instructions  Continue on BREZTRI 2 puffs Twice daily  , rinse after use.  Prednisone 57m daily  Flutter valve Three times a day  .  Please eat often,  High protein food and drinks. -try high calorie boost.  Continue on Fasenra and Xolair .  Continue with ID follow up.  Continue on Oxygen 2l/m with activity and At bedtime, to keep O2 sats >88-90%.  Evaluate for POC .  Albuterol inhaler As needed  Wheezing/shortness of breath .  Albuterol neb every 6hrs as needed for wheezing /shortness of breath as needed.  Hypertonic nebs 1-2 twice daily  Robitussin DM As needed  Cough/congestion  Sputum cultures (AFB, fungal and sputum culture) Follow up with Dr. RChase Calleror Cyndie Woodbeck NP in 6 -8 weeks  and As needed   Please contact office for sooner follow up if symptoms do not improve or worsen or seek emergency care

## 2022-05-10 NOTE — Assessment & Plan Note (Signed)
Continue follow-up with infectious disease and current maintenance regimen

## 2022-05-11 ENCOUNTER — Telehealth: Payer: Self-pay | Admitting: Adult Health

## 2022-05-11 ENCOUNTER — Other Ambulatory Visit: Payer: BC Managed Care – PPO

## 2022-05-11 ENCOUNTER — Other Ambulatory Visit (HOSPITAL_COMMUNITY): Payer: Self-pay

## 2022-05-11 DIAGNOSIS — J479 Bronchiectasis, uncomplicated: Secondary | ICD-10-CM | POA: Diagnosis not present

## 2022-05-14 LAB — RESPIRATORY CULTURE OR RESPIRATORY AND SPUTUM CULTURE
MICRO NUMBER:: 13564985
SPECIMEN QUALITY:: ADEQUATE

## 2022-05-15 ENCOUNTER — Other Ambulatory Visit (HOSPITAL_COMMUNITY): Payer: Self-pay

## 2022-05-16 NOTE — Telephone Encounter (Signed)
Called and spoke with pt who stated he was trying to get a POC from [employer] but said that [employer] did not have the POC that he was wanting. Stated that he has called [employer] about this and they are trying to see if his insurance will cover a POC. Pt said he called [employer] about 5 days ago and has not heard back from them, stated that he would try to call them back to see if they had more info for him.   Pt wanted to know if Tammy might be able to help him out with this as pt stated that she knew someone who worked with [employer].  Also pt wanted to know the results of sputum cultures. Pt is aware that TP is out of the office until 7/3.   Routing to Tammy for her to review once she returns to the office.

## 2022-05-17 ENCOUNTER — Other Ambulatory Visit (HOSPITAL_COMMUNITY): Payer: Self-pay

## 2022-05-21 ENCOUNTER — Other Ambulatory Visit: Payer: Self-pay | Admitting: *Deleted

## 2022-05-21 ENCOUNTER — Encounter (HOSPITAL_COMMUNITY): Payer: Self-pay

## 2022-05-21 DIAGNOSIS — J479 Bronchiectasis, uncomplicated: Secondary | ICD-10-CM

## 2022-05-21 DIAGNOSIS — J4550 Severe persistent asthma, uncomplicated: Secondary | ICD-10-CM

## 2022-05-21 MED ORDER — DOXYCYCLINE HYCLATE 100 MG PO TABS: 100.0000 mg | ORAL_TABLET | Freq: Two times a day (BID) | ORAL | 0 refills | Status: DC

## 2022-05-21 NOTE — Telephone Encounter (Signed)
Sputum culture showed Staph Aureus  Recommend Doxycycline 110m Twice daily  for 1 week, rx sent.   Please call our rep from [employer] to see if we can process this faster for him.   Please contact office for sooner follow up if symptoms do not improve or worsen or seek emergency care

## 2022-05-21 NOTE — Telephone Encounter (Signed)
Spoke with Micheal from [facility] who stated he needed OV, walk and order for O2 containing liter per min and flow rate faxed to [employer] and then they would be able to charge pt's insurance. Requested paperwork was faxed to [employer]. ATC pt unable to reach [facility] for update.

## 2022-05-21 NOTE — Telephone Encounter (Signed)
Called and spoke with patient, advised him of results/recommendations per Rexene Edison NP.  He verbalized understanding. He asked about the POC to [employer].  Advised him I would call [employer].  Called and left VM for Dwyane Luo.  Reviewed order for POC, appears it was sent to [employer].  Order sent to [employer] with qualifying walk attached.

## 2022-05-24 ENCOUNTER — Other Ambulatory Visit (HOSPITAL_COMMUNITY): Payer: Self-pay

## 2022-05-24 DIAGNOSIS — J479 Bronchiectasis, uncomplicated: Secondary | ICD-10-CM | POA: Diagnosis not present

## 2022-05-25 ENCOUNTER — Other Ambulatory Visit (HOSPITAL_COMMUNITY): Payer: Self-pay

## 2022-05-29 DIAGNOSIS — R0902 Hypoxemia: Secondary | ICD-10-CM | POA: Diagnosis not present

## 2022-05-29 DIAGNOSIS — B4481 Allergic bronchopulmonary aspergillosis: Secondary | ICD-10-CM | POA: Diagnosis not present

## 2022-05-29 DIAGNOSIS — B449 Aspergillosis, unspecified: Secondary | ICD-10-CM | POA: Diagnosis not present

## 2022-05-29 DIAGNOSIS — A312 Disseminated mycobacterium avium-intracellulare complex (DMAC): Secondary | ICD-10-CM | POA: Diagnosis not present

## 2022-06-05 ENCOUNTER — Other Ambulatory Visit (HOSPITAL_COMMUNITY): Payer: Self-pay

## 2022-06-05 DIAGNOSIS — J9611 Chronic respiratory failure with hypoxia: Secondary | ICD-10-CM | POA: Diagnosis not present

## 2022-06-06 DIAGNOSIS — E039 Hypothyroidism, unspecified: Secondary | ICD-10-CM | POA: Diagnosis not present

## 2022-06-07 ENCOUNTER — Other Ambulatory Visit (HOSPITAL_COMMUNITY): Payer: Self-pay

## 2022-06-14 NOTE — Telephone Encounter (Signed)
Called and spoke with patient, he stated he received the [employer] POC.  Nothing further needed.

## 2022-06-18 ENCOUNTER — Encounter (HOSPITAL_COMMUNITY): Payer: Self-pay

## 2022-06-18 ENCOUNTER — Telehealth (HOSPITAL_COMMUNITY): Payer: Self-pay

## 2022-06-18 ENCOUNTER — Other Ambulatory Visit (HOSPITAL_COMMUNITY): Payer: Self-pay

## 2022-06-18 NOTE — Telephone Encounter (Signed)
Attempted to call patient in regards to Pulmonary Rehab - LM on VM Mailed letter 

## 2022-06-21 ENCOUNTER — Telehealth: Payer: Self-pay | Admitting: Internal Medicine

## 2022-06-21 ENCOUNTER — Other Ambulatory Visit (HOSPITAL_COMMUNITY): Payer: Self-pay

## 2022-06-21 MED ORDER — ALBUTEROL SULFATE (2.5 MG/3ML) 0.083% IN NEBU
2.5000 mg | INHALATION_SOLUTION | RESPIRATORY_TRACT | 12 refills | Status: DC | PRN
Start: 2022-06-21 — End: 2023-06-06

## 2022-06-21 MED ORDER — ALBUTEROL SULFATE HFA 108 (90 BASE) MCG/ACT IN AERS: 2.0000 | INHALATION_SPRAY | Freq: Four times a day (QID) | RESPIRATORY_TRACT | 2 refills | Status: DC | PRN

## 2022-06-21 NOTE — Telephone Encounter (Signed)
Called and spoke to patient about medication that he needed refilled. He needs his inhaler and nebulizer medication of Albuterol. Refilled both. Verified pharmacy. Nothing further needed

## 2022-06-21 NOTE — Telephone Encounter (Signed)
Rx for pt's neb sol had been sent to pharmacy for pt same time as rescue inhaler. Called and spoke with pt letting him know this had been done and he verbalized understanding. Nothing further needed.

## 2022-06-27 ENCOUNTER — Ambulatory Visit (INDEPENDENT_AMBULATORY_CARE_PROVIDER_SITE_OTHER): Payer: BC Managed Care – PPO | Admitting: Primary Care

## 2022-06-27 ENCOUNTER — Encounter: Payer: Self-pay | Admitting: Primary Care

## 2022-06-27 DIAGNOSIS — J479 Bronchiectasis, uncomplicated: Secondary | ICD-10-CM

## 2022-06-27 DIAGNOSIS — J4550 Severe persistent asthma, uncomplicated: Secondary | ICD-10-CM | POA: Diagnosis not present

## 2022-06-27 DIAGNOSIS — A310 Pulmonary mycobacterial infection: Secondary | ICD-10-CM | POA: Diagnosis not present

## 2022-06-27 DIAGNOSIS — J9611 Chronic respiratory failure with hypoxia: Secondary | ICD-10-CM

## 2022-06-27 DIAGNOSIS — B440 Invasive pulmonary aspergillosis: Secondary | ICD-10-CM

## 2022-06-27 MED ORDER — DOXYCYCLINE HYCLATE 100 MG PO TABS: 100.0000 mg | ORAL_TABLET | Freq: Two times a day (BID) | ORAL | 0 refills | Status: DC

## 2022-06-27 NOTE — Assessment & Plan Note (Signed)
-  Severe persistent asthma with allergic phenotype with significant burden. Currently well controlled on present treatment. Continue Breztri Aerisphere, Prednisone 17m daily, Fasenra and Xolair.

## 2022-06-27 NOTE — Assessment & Plan Note (Addendum)
-  Patient referred to pulmonary rehab, plans to start in [place]  - He has [employer] POC / Continue 2L supplemental oxygen to maintain O2 >88-92%

## 2022-06-27 NOTE — Progress Notes (Signed)
_0  ID: [name], male    DOB: 20-Feb-1976, 46 y.o.   MRN: [id number]  Chief Complaint  Patient presents with   Follow-up    Pt f/u breathing is some better, not experiencing SOB, cough, wheezing as bad. 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  Chronic respiratory failure on oxygen (started in February 2022) History of recurrent hemoptysis with severe episode in 2016 requiring interventional guided embolization and in April 2023 with respiratory distress requiring intubation. 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  2016 Hospitalzed with hemoptysis >IR guided embolization . FOB/BAL + Saccharomyces on fungal fx . Serum Galactomannan was neg.  [name] evaluation 04/2017 for Transplant -rejected. Felt to early for transplant - recommended pre-transplant weight goal 128 lbs for a BMI > 19  05/09/2022 Follow up ; severe persistent asthma, ABPA, aspergilloma, MAC, hemoptysis, chronic respiratory failure Patient returns for 1 month follow-up.  Patient was seen last visit for a posthospital follow-up.  He had a very complicated hospitalization with severe hemoptysis and acute respiratory distress.  He did require intubation and vent support.  He underwent bronchoscopy with no  evidence of active bleeding.  He was treated with Tranexamic acid .  Found to have MSSA pneumonia.  BAL showed Staph aureus (resistant to Cipro, clindamycin, erythromycin and Bactrim) AFB was negative.  Fungal cultures were negative.  Cytology showed no malignant cells.  Numerous acute inflammatory cells. Patient says he is doing about the same he has had no recurrence of hemoptysis but continues to feel very weak low energy decreased activity tolerance and endurance.  He remains on oxygen 2 L with activity and at bedtime.  Feels that he needs oxygen more during the daytime when he is doing things especially at work.  Patient works part-time at [employer].  He typically can work about 20 to 25 hours.  But is very hard on him.  He wears out very easily Patient is trying to eat a high-protein diet.  He is starting to use Ensure but continues to have weight loss.  Current weight is at 109 pounds.  Patient has applied for disability but for some reason has not been approved despite having significant comorbidities and struggles daily.  Patient says he needs a portable oxygen tank he has a trouble carrying his E tanks to work.  Today in office patient O2 saturations are 87% on room air.  Requires 2 L pulsed oxygen on portable POC to maintain O2 saturations greater than 88 to 90%. Patient lives alone and is able to drive.  Gets winded easily.  Patient does have parents that are close by better not in good health. Patient continues to do triple therapy with Breztri inhaler.  He is on chronic steroids with prednisone 10 mg daily.  He is using his flutter valve.  Is unable to  use vest therapy due to severe back and rib pain with use.  Patient is using hypertonic nebs 1-2 daily.  Continues to have a daily cough multiple times throughout the day with thick mucus.  Patient says he is able to get up the mucus easily but it is very thick and productive. CT chest done on May 02, 2022 showed very large bullae at the lung  apices, previous debris and filling defect redness resolved in the right apical bullae.  This does appear to be improved from previous scan.  Along the inferior extension into the right lower lobe with some cavitation and internal debris and material with increased gas component compared to previous scan.  Measuring 4.8 x 2.0 cm previous was 4.5 x 2.4 cm.  Left apical bullae with debris and density mostly gas-filled bullae represents progressive superinfection of the bulla.  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  We discussed today referral back to [facility] for consideration of lung transplant he has been there previously also referral for second opinion patient declines at this time.  Patient says that the process is very expensive and time-consuming.  He prefers to stay locally at present time. Patient says when he went for lung transplant work-up previously he did not feel like the benefits outweigh the risk and cost.   06/27/2022- Interim hx / Severe persistent asthma, ABPA, aspergilloma, MAC, hemoptysis, chronic respiratory failure. Patient presents today for 6-week follow-up.  Since last overview he received [employer] portable oxygen concentrator with [employer]. Sputum culture from June came back showing [place]. He was treated with course of Doxycycline. Patient is at his baseline today. No acute respiratory complaints. Cough has improved. He keeps a chronic cough every once in awhile. Breathing is a little better but not 100%. He is maintained on Breztri Aeropshere two puffs twice daily, 40m prednisone daily, Xolair and Fasenra. He is also on Azithromycin 5073mdaily per Dr. VaTommy MedalHe is not wearing oxygen today, O2 96% RA. [facility] the past d.t expense and travel, he would prefer to stay local. He applied for disability in  the past and was denied despite having several severe and chronic medical conditions.   CT chest done on May 02, 2022 showed very large bullae at the lung apices, previous debris and filling defect redness resolved in the right apical bullae.  This does appear to be improved from previous scan.  Along the inferior extension into the right lower lobe with some cavitation and internal debris and material with increased gas component compared to previous scan.  Measuring 4.8 x 2.0 cm previous was 4.5 x 2.4 cm.  Left apical bullae with debris and density mostly gas-filled bullae represents progressive superinfection of the bulla.  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  TEST/EVENTS :  7/13 Ig E 800s ( ~4000 2012 )  12/10/11 Sputum AFB  Smear and culture negative (prelim) 12/10/11 Fungal sputum culture - negative (final) 01/30/2012 spirometry - fev1 1.7L/41%, ratip 52 - BEST EVER 01/30/2012 walking desaturation test: 185 feet x 3 laps: did not deaturate  05/2014 >Spiro fev1 1.7L/40%, ratio 55  Spirometry 06/25/2017 showed FEV1 at 28%, ratio 46, FVC 50%. CT chest July 2021 showed chronic narrowing and occlusion of several of the right upper lobe segmental and subsegmental pulmonary arteries additional  filling defects in the left upper lobe segmental and subsegmental branches.  Extensive bullous disease.  Architectural distortion most suggestive of chronic embolus/occlusion, right atrial enlargement , Groundglass and consolidative opacity in the left lower and right lower periphery   Sputum culture July 2021 + for Klebsiella pneumonia -pansensitive except for ampicillin.   August 2021 2D echo showed EF 34%, grade 1 diastolic dysfunction mildly elevated pulmonary artery systolic pressure at 39 mmHg  Allergies  Allergen Reactions   Clarithromycin Other (See Comments)    Adverse reaction w/  voriconazole    Rifaximin Other (See Comments)    Adverse reaction w/ voriconazole     Immunization History  Administered Date(s) Administered   DTaP 11/19/2010   Influenza Split 10/29/2011, 08/14/2012   Influenza Whole 09/19/2010   Influenza,inj,Quad PF,6+ Mos 11/23/2013, 09/28/2014, 09/06/2015, 08/27/2016, 08/10/2017, 09/09/2018, 08/06/2019, 08/10/2020, 09/27/2021   PFIZER Comirnaty(Gray Top)Covid-19 Tri-Sucrose Vaccine 12/28/2020   PFIZER(Purple Top)SARS-COV-2 Vaccination 02/18/2020, 03/14/2020, 08/10/2020   Pfizer Covid-19 Vaccine Bivalent Booster 79yr & up 10/04/2021, 04/04/2022   Pneumococcal Conjugate-13 04/29/2017, 04/29/2017   Pneumococcal Polysaccharide-23 12/17/2010    Past Medical History:  Diagnosis Date   Anemia    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[place]    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[facility] 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   CAP (community acquired pneumonia) 03/09/2022   Chronic viral hepatitis B without delta-agent ([facility] 11/23/2020   Drug rash 12/16/2017   Dyspnea    Esophageal reflux    very rare   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   Lung disease, bullous ([place]    MAI (mycobacterium avium-intracellulare) ([facility]    Mold exposure 12/05/2015   Photosensitivity dermatitis 09/16/2017   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[place] 03/14/2022   Rib fracture 02/08/2021   Solar lentigo 08/08/2015   Tension pneumothorax 09/04/2018   Vaccine counseling 12/28/2020   Weight loss 10/15/2016    Tobacco History: Social History   Tobacco Use  Smoking Status Former   Packs/day: 0.70   Years: 19.00   Total pack years: 13.30   Types: Cigarettes   Quit date: 11/20/2007   Years since quitting: 14.6  Smokeless Tobacco Former   Types: Snuff   Quit date: 11/19/1994   Counseling given: Not Answered   Outpatient Medications Prior to Visit  Medication Sig Dispense Refill   albuterol (PROVENTIL) (2.5 MG/3ML) 0.083% nebulizer solution Take 3 mLs (2.5 mg total) by nebulization every 4 (four) hours as  needed for wheezing or shortness of breath. 75 mL 12   albuterol (VENTOLIN HFA) 108 (90 Base) MCG/ACT inhaler Inhale 2 puffs into the lungs every 6 (six) hours as needed. 18 g 2   azithromycin (ZITHROMAX) 500 MG tablet Take 1 tablet (500 mg total) by mouth daily. 30 tablet 11   Benralizumab (FASENRA PEN) 30 MG/ML SOAJ Inject 1 mL (30 mg total) into the skin every 8 (eight) weeks. 1 mL 2   BREZTRI AEROSPHERE 160-9-4.8 MCG/ACT AERO INHALE 2 PUFFS BY MOUTH TWICE DAILY (Patient taking differently: Inhale 2 puffs into the lungs 2 (two) times daily.) 10.7 g 5   CALCIUM CARBONATE-VITAMIN D PO Take 2 tablets by mouth every morning.     chlorpheniramine-HYDROcodone 10-8 MG/5ML Take 5 mLs by mouth every 12 (twelve) hours as needed for cough. 115 mL 0   EPINEPHrine 0.3 mg/0.3 mL IJ SOAJ injection ADMINISTER 0.3 MG IN THE MUSCLE 1 TIME FOR 1 DOSE 2 each 3   ethambutol (MYAMBUTOL) 400 MG tablet TAKE 2 AND 1/2 TABLETS BY MOUTH ONCE DAILY 598 tablet 5  feeding supplement, ENSURE ENLIVE, (ENSURE ENLIVE) LIQD Take 237 mLs by mouth 3 (three) times daily between meals. (Patient taking differently: Take 237 mLs by mouth 2 (two) times daily as needed (meal supplement).) 237 mL 12   ferrous sulfate 325 (65 FE) MG tablet Take 325 mg by mouth every morning.     folic acid (FOLVITE) 1 MG tablet Take 1 mg by mouth every other day.     Isavuconazonium Sulfate (CRESEMBA) 186 MG CAPS TAKE 2 CAPSULES BY MOUTH DAILY 60 capsule 11   levothyroxine (SYNTHROID) 100 MCG tablet Take 100 mcg by mouth daily before breakfast.     Menthol-Methyl Salicylate (SALONPAS PAIN RELIEF PATCH EX) Place 1-2 patches onto the skin daily. Back pain     mirtazapine (REMERON SOL-TAB) 45 MG disintegrating tablet Take 45 mg by mouth at bedtime.     omalizumab (XOLAIR) 150 MG/ML prefilled syringe INJECT 300 MG INTO THE SKIN EVERY 14 (FOURTEEN) DAYS. IN ADDITION TO 75MG (TOTAL DOSE IS 375MG EVERY 14 DAYS). 4 mL 5   omalizumab (XOLAIR) 75 MG/0.5ML prefilled  syringe INJECT 75 MG INTO THE SKIN EVERY 14 (FOURTEEN) DAYS. IN ADDITION TO 300MG (TOTAL DOSE IS 375MG EVERY 14 DAYS). DELIVER TO PATIENT'S HOME. 1 mL 5   OXYGEN Inhale 3 L into the lungs as needed (shortness of breath).     predniSONE (DELTASONE) 10 MG tablet Take 1 tablet (10 mg total) by mouth daily with breakfast. 90 tablet 3   sodium chloride HYPERTONIC 3 % nebulizer solution USE NEBULIZEER DAILY AS DIRECTED 750 mL 5   doxycycline (VIBRA-TABS) 100 MG tablet Take 1 tablet (100 mg total) by mouth 2 (two) times daily. 14 tablet 0   No facility-administered medications prior to visit.   Review of Systems  Review of Systems  Constitutional: Negative.   HENT:  Positive for congestion.   Respiratory:  Positive for cough. Negative for chest tightness, shortness of breath and wheezing.   Cardiovascular: Negative.    Physical Exam  BP 112/72   Pulse 69   Ht _0  (1.727 m)   Wt 111 lb 6.4 oz (50.5 kg)   SpO2 96%   BMI 16.94 kg/m  Physical Exam Constitutional:      Appearance: Normal appearance.     Comments: Underweight  HENT:     Head: Normocephalic and atraumatic.     Mouth/Throat:     Mouth: Mucous membranes are moist.     Pharynx: Oropharynx is clear.  Cardiovascular:     Rate and Rhythm: Normal rate and regular rhythm.  Pulmonary:     Effort: Pulmonary effort is normal.     Breath sounds: Normal breath sounds. No wheezing or rales.  Musculoskeletal:        General: Normal range of motion.     Cervical back: Normal range of motion and neck supple.  Skin:    General: Skin is warm and dry.  Neurological:     General: No focal deficit present.     Mental Status: He is alert and oriented to person, place, and time. Mental status is at baseline.  Psychiatric:        Mood and Affect: Mood normal.        Behavior: Behavior normal.        Thought Content: Thought content normal.        Judgment: Judgment normal.      Lab Results:  CBC    Component Value Date/Time    WBC 9.3 03/15/2022 0650  RBC 3.97 (L) 03/15/2022 0650   HGB 12.8 (L) 03/15/2022 0650   HCT 38.5 (L) 03/15/2022 0650   PLT 200 03/15/2022 0650   MCV 97.0 03/15/2022 0650   MCH 32.2 03/15/2022 0650   MCHC 33.2 03/15/2022 0650   RDW 12.9 03/15/2022 0650   LYMPHSABS 0.4 (L) 03/11/2022 0552   MONOABS 0.5 03/11/2022 0552   EOSABS 0.0 03/11/2022 0552   BASOSABS 0.0 03/11/2022 0552    BMET    Component Value Date/Time   NA 139 03/15/2022 0650   K 3.8 03/15/2022 0650   CL 98 03/15/2022 0650   CO2 34 (H) 03/15/2022 0650   GLUCOSE 103 (H) 03/15/2022 0650   BUN 7 03/15/2022 0650   CREATININE 0.83 03/15/2022 0650   CREATININE 0.83 10/04/2021 1610   CALCIUM 8.8 (L) 03/15/2022 0650   GFRNONAA >60 03/15/2022 0650   GFRNONAA 107 11/23/2020 1202   GFRAA 124 11/23/2020 1202    BNP    Component Value Date/Time   BNP 73.0 03/08/2022 1303    ProBNP    Component Value Date/Time   PROBNP 97.0 03/07/2011 0918    Imaging: No results found.   Assessment & Plan:   Severe persistent asthma - Severe persistent asthma with allergic phenotype with significant burden. Currently well controlled on present treatment. Continue Breztri Aerisphere, Prednisone 34m daily, Fasenra and Xolair.   Bronchiectasis ([place] - Sputum culture in June 2023 positive for staph, treated with 7 day course doxycycline. Symptoms improved but did not completely resolve. Extending additional 5 days. Continue Hypertonic saline nebulizer twice daily and flutter valve three times a day.   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

## 2022-06-27 NOTE — Assessment & Plan Note (Signed)
-  Status post VATS in 2012 on lifelong antifungal therapy followed by infectious disease

## 2022-06-27 NOTE — Patient Instructions (Addendum)
  Recommendations: - Continue on BREZTRI 2 puffs Twice daily  - Continue Prednisone 92m daily  - Continue Azithromycin 2522m daily  - Continue on Fasenra and Xolair  - Use hypertonic saline nebulizer's 2 times a day followed by flutter valve for cough - Continue on Oxygen 2l/m with activity and At bedtime (goal maintain O2 sats >88-90%).  - Continue Albuterol inhaler OR nebulizer every 4-6 hours as needed for wheezing/shortness of breath  - Continue with ID follow up - Follow up on pulmonary rehab in [place] if able - Take Doxycycline for additional 5 days d/t positive sputum culture   Rx: - Doxycycline 1 tab twice daily x 5 days   Follow-up: - 12 weeks with Dr. RaChase Callerpreferred) or Tammy Parrett if no openings

## 2022-06-27 NOTE — Assessment & Plan Note (Addendum)
-  Sputum culture in June 2023 positive for staph, treated with 7 day course doxycycline. Symptoms improved but did not completely resolve. Extending additional 5 days. Continue Hypertonic saline nebulizer twice daily and flutter valve three times a day.

## 2022-06-27 NOTE — Assessment & Plan Note (Signed)
-  Following with ID, remains on daily azithromycin 233mg

## 2022-06-29 ENCOUNTER — Other Ambulatory Visit (HOSPITAL_COMMUNITY): Payer: Self-pay

## 2022-07-01 LAB — AFB CULTURE WITH SMEAR (NOT AT ARMC)
Acid Fast Culture: NEGATIVE
Acid Fast Smear: NEGATIVE

## 2022-07-02 ENCOUNTER — Other Ambulatory Visit (HOSPITAL_COMMUNITY): Payer: Self-pay

## 2022-07-04 ENCOUNTER — Other Ambulatory Visit (HOSPITAL_COMMUNITY): Payer: Self-pay

## 2022-07-04 ENCOUNTER — Ambulatory Visit (INDEPENDENT_AMBULATORY_CARE_PROVIDER_SITE_OTHER): Payer: BC Managed Care – PPO | Admitting: Infectious Disease

## 2022-07-04 ENCOUNTER — Other Ambulatory Visit: Payer: Self-pay

## 2022-07-04 ENCOUNTER — Encounter: Payer: Self-pay | Admitting: Infectious Disease

## 2022-07-04 VITALS — BP 117/74 | HR 72 | Resp 16 | Ht 68.0 in | Wt 112.0 lb

## 2022-07-04 DIAGNOSIS — Z23 Encounter for immunization: Secondary | ICD-10-CM | POA: Diagnosis not present

## 2022-07-04 DIAGNOSIS — B181 Chronic viral hepatitis B without delta-agent: Secondary | ICD-10-CM | POA: Diagnosis not present

## 2022-07-04 DIAGNOSIS — R634 Abnormal weight loss: Secondary | ICD-10-CM

## 2022-07-04 DIAGNOSIS — J15211 Pneumonia due to Methicillin susceptible Staphylococcus aureus: Secondary | ICD-10-CM | POA: Diagnosis not present

## 2022-07-04 DIAGNOSIS — B4481 Allergic bronchopulmonary aspergillosis: Secondary | ICD-10-CM

## 2022-07-04 DIAGNOSIS — B449 Aspergillosis, unspecified: Secondary | ICD-10-CM | POA: Diagnosis not present

## 2022-07-04 DIAGNOSIS — R630 Anorexia: Secondary | ICD-10-CM

## 2022-07-04 DIAGNOSIS — A310 Pulmonary mycobacterial infection: Secondary | ICD-10-CM

## 2022-07-04 MED ORDER — ETHAMBUTOL HCL 400 MG PO TABS: 1000.0000 mg | ORAL_TABLET | Freq: Every day | ORAL | 11 refills | Status: DC

## 2022-07-04 MED ORDER — CRESEMBA 186 MG PO CAPS
2.0000 | ORAL_CAPSULE | Freq: Every day | ORAL | 11 refills | Status: DC
  Filled 2022-07-04: qty 60, 30d supply, fill #0
  Filled 2022-07-31: qty 56, 28d supply, fill #0
  Filled 2022-09-06: qty 56, 28d supply, fill #1
  Filled 2022-10-10: qty 56, 28d supply, fill #2
  Filled 2022-11-14: qty 56, 28d supply, fill #3
  Filled 2022-12-09: qty 56, 28d supply, fill #4
  Filled 2023-01-08: qty 56, 28d supply, fill #5
  Filled 2023-02-06: qty 56, 28d supply, fill #6
  Filled 2023-03-07 – 2023-03-11 (×2): qty 56, 28d supply, fill #7
  Filled 2023-04-09: qty 56, 28d supply, fill #8
  Filled 2023-05-13: qty 56, 28d supply, fill #9
  Filled 2023-06-03: qty 56, 28d supply, fill #10

## 2022-07-04 MED ORDER — AZITHROMYCIN 500 MG PO TABS: 500.0000 mg | ORAL_TABLET | Freq: Every day | ORAL | 11 refills | Status: DC

## 2022-07-04 NOTE — Progress Notes (Signed)
Subjective:  Chief complaint continued cough follow-up for Aspergillus and mycobacterial avium infections, also with faculty gaining weight  Patient ID: [name], male    DOB: October 24, 1976, 46 y.o.   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 We had taken him off his Mycobacterium avium drugs and then he developed fevers chills and malaise  Spring (2013) and was seen by my partner Dr. Megan Salon in late April  His AFB smears and cultures obtained in the clinic on that day prior to the initiation of azithromycin ethambutol ultimately failed to grow Mycobacterium avium. He had been doing well I saw him in [place] 2013 we discontinued his azithromycin and ethambutol again. Unfortunately he again shortly thereafter developed fevers cough malaise. He started back on azithromycin ethambutol. Cultures done for AFB here were again negative. The patient however felt dramatically better having been back on azithromycin and ethambutol.    I saw him in January of 2015 when he was feeling relatively well.   He did have an episode of coughing up a few flecks of blood in January 2016 which prompted appt with [facility] but by time he made appt this had long since resolved. Was only a one time episode none since then.   I saw again in March and then in September 2016. In summer he was hospitalized with hemoptysis and found to have an area in the upper lobe consistent with an aspergilloma. He underwent bronchoscopy with BAL and cultures ultimately yielded a Saccharomyces species on fungal culture. His serum galactomannan was negative.   He had been  on voriconazole and axial has had radiographic improvement and  his x-ray findings. He also feelt etter symptomatically with no more hemoptysis improvement in his cough improvement in his energy though he still feels not nearly as well as he did prior to his hospitalization. He is avoiding exercised during the winter as it often precipitates coughing spells. He was  only taking 200 mg twice a day of voriconazole rather than the 300 mg he has continued this along with his ethambutol and azithromycin. He continued to have cough more so with exercise and has not been exercising at "[employer]" due to the fact this elicits more coughing. He has not smoked since 2007. He is seeing [name] and is being considered for new drug infusion to treat his COPD.   He did have 20# weight loss that he attributed to increased exercise.   He then saw  [name] after an episode of hematemesis. 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 He said he only coughed up a small amount of blood material and did not go to the ER but was seen and followed by pulmonary. Had a chest x-ray which showed no new findings.   He was then  going to be evaluated by the transplant team at [facility].   He did complain of hyperpigmented spots on sun exposed areas on his body including arms and neck which his dermatologist have told him is due to his voriconazole use.    We considered change at that time   Since that time he has [place] been admitted with hemoptysis at [facility].    Repeat  CT lungs showed on 08/09/17:   Large emphysematous bulla are noted in both upper lobes. There is interval development of fluid density in several bulla in the left upper lobe concerning for superimposed infection, possibly fungal in origin.   Stable bilateral calcified pulmonary nodules are noted consistent with prior granulomatous disease.   Stable tree in bud appearance is noted medially in the left lung posteriorly consistent with sequela of previous atypical  infection.     Due to his skin toxicity we decided to change him to [facility] and he has been on that for some time now.  He had problems with bilateral pneumothoraces.  This required placement of an intrabronchial valve in November 2019.  This resolved but then he had to be taken back to the operating room in early January where the intrabronchial valves were removed.   Was seen at [facility] medical transplant team as well but felt not yet to be a transplant candidate due to the fact that his lungs were not in severe enough condition to warrant transplantation   He had applied for disability but this has not yet gone through.  Certainly he deserves to have disability and he has significant medical problems.  He did however eventually go back to work at [employer]   He is been receiving his antimycobacterial drugs and is [place].   I last saw cans he was hospitalized in July 2021  for what he says was pneumonia.  The CT scan was read as showing:   " Chronic narrowing and occlusion of several right upper lobe segmental subsegmental pulmonary arteries and filling defects in the left upper lobe segmental and subsegmental branches which were within the regions of bullous disease which are suggestive of chronic embolism/occlusion.   It showed extensive bullous and paraseptal predominant emphysematous changes with thick-walled cystic bullae toward the apices with nodularity and air layering air-fluid levels within the left apical bulla consistent with aspergilloma.   He had mixed areas of groundglass and consolidative opacity left lower lobe and right lower lobe periphery.   Nares note indicates that he was not anticoagulated due to the fact that he has recurrent hemoptysis per the patient himself was unaware that he was found to have anything suggestive clots on scan and believes he was only treated for pneumonia.   Since I last saw [name] he was treated with Augmentin and corticosteroids for  flare of bronchitis.   He improved on both steroids and antibiotics but within 3 to 5 days after coming off the Augmentin had worsening cough with productive sputum particularly in the morning some with some blood streaking.   He had seen pulmonary who had recommended him come see [place] again and I saw him in November   He hadnot had fevers chills or other systemic symptoms.   He was apprehensive that he could be harboring resistant organisms.Out of his lungs.   Get I think there is certainly possibility that his mycobacteria could have developed some resistance and/or that he has a superimposed bacterial infection yet again.   At the time we switched out his azithromycin for levofloxacin.  His cough may have improved a bit in terms of his severity.  He says about 10 out of 10 in severity when he saw [place] last versus a 6-7.   Sputum is changed slightly now with a red tinge to it.   He was also seen by hematology oncology to work-up anemia at [facility].   In  the context of his weight loss they also checked him for viral hepatitis panel and [place] hepatitis B surface antigen was positive along with a hepatitis B DNA though at a low value value of 2050 copies   Since seen [name] and is starting Xolair in addition to the [place] he is already taking along with systemic prednisone (in midst of a taper)   We checked for delta agent this was negative.  We checked hepatitis B E antigen antibody which was positive.   We did an ultrasound screening for hepatocellular carcinoma and this was negative for any lesions.   He is also seen GI and hepatology at [facility].  There are plans for both an upper endoscopy and lower endoscopy to evaluate his anemia.       Cannot have been using a percussive vest but then suffered rib fractures from this.  He was happy that he has gone nearly 3 years without being admitted to the hospital with hemoptysis when I saw him in  November  Fortunately he was actually admitted to the hospital in April to 20th  2023 with hemoptysis again requiring intubation and mechanical ventilation.  MSSA was isolated from the lungs and he was treated for MSSA pneumonia.  Currently remains on his Cresemba as well as ethambutol and azithromycin.  He seems improved from when it compared to when I saw him last.  He is having difficulty gaining weight despite taking boost which he purchased online.  He has much less of an appetite as he did in the past he sounds to have been on the gastral in the past I suggested the possibility of Marinol prescription to help with appetite.  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[place]) 03/14/2022   Rib fracture 02/08/2021   Solar lentigo 08/08/2015   Tension pneumothorax 09/04/2018   Vaccine counseling 12/28/2020   Weight loss 10/15/2016    Past Surgical History:  Procedure Laterality Date   BRONCHIAL WASHINGS  03/10/2022   Procedure: BRONCHIAL WASHINGS;  Surgeon: Laurin Coder, MD;  Location: [place] [facility];  Service: Pulmonary;;   CHEST TUBE INSERTION Right 09/29/2018   Procedure: CHEST TUBE REPLACEMENT;  Surgeon: Melrose Nakayama, MD;  Location: [place];  Service: Thoracic;  Laterality: Right;   INSERTION OF IBV VALVE N/A 09/19/2018   Procedure: INSERTION OF INTERBRONCHIAL VALVE (IBV);  Surgeon: Melrose Nakayama, MD;  Location: [facility] OR;  Service: Thoracic;  Laterality: N/A;   INSERTION OF  IBV VALVE N/A 12/01/2018   Procedure: REMOVAL OF INTERBRONCHIAL VALVE (IBV);  Surgeon: Melrose Nakayama, MD;  Location[facility] OR;  Service: Thoracic;  Laterality: N/A;   left VATS  2012   thoracotomy and LUL apical posterior segmentectomy   STAPLING OF BLEBS Right 09/12/2018   Procedure: STAPLING OF BLEBS, right lower and middle lung lobes;  Surgeon: Melrose Nakayama, MD;  Location: [place];  Service: Thoracic;  Laterality: Right;   [place]  Right 09/12/2018   Procedure: VIDEO ASSISTED THORACOSCOPY, right lung;  Surgeon: Melrose Nakayama, MD;  Location: [facility] OR;  Service: Thoracic;  Laterality: Right;   VIDEO BRONCHOSCOPY Bilateral 10/20/2015   Procedure: VIDEO BRONCHOSCOPY WITHOUT FLUORO;  Surgeon: Marshell Garfinkel, MD;  Location: [place];  Service: Cardiopulmonary;  Laterality: Bilateral;   VIDEO BRONCHOSCOPY N/A 09/19/2018   Procedure: VIDEO BRONCHOSCOPY;  Surgeon: Melrose Nakayama, MD;  Location: [place];  Service: Thoracic;  Laterality: N/A;   VIDEO BRONCHOSCOPY N/A 12/01/2018   Procedure: VIDEO BRONCHOSCOPY;  Surgeon: Melrose Nakayama, MD;  Location: [place];  Service: Thoracic;  Laterality: N/A;   VIDEO BRONCHOSCOPY N/A 03/10/2022   Procedure: VIDEO BRONCHOSCOPY WITHOUT FLUORO;  Surgeon: Laurin Coder, MD;  Location: [place] [facility];  Service: Pulmonary;  Laterality: N/A;   VIDEO BRONCHOSCOPY WITH INSERTION OF INTERBRONCHIAL VALVE (IBV) N/A 09/29/2018   Procedure: VIDEO BRONCHOSCOPY WITH INSERTION OF INTERBRONCHIAL VALVE  (IBV);  Surgeon: Melrose Nakayama, MD;  Location: [facility] OR;  Service: Thoracic;  Laterality: N/A;    Family History  Adopted: Yes      Social History   Socioeconomic History   Marital status: Single    Spouse name: Not on file   Number of children: Not on file   Years of education: Not on file   Highest education level: Not on file  Occupational History   Occupation: works 2 jobs  Tobacco Use   Smoking status: Former     Packs/day: 0.70    Years: 19.00    Total pack years: 13.30    Types: Cigarettes    Quit date: 11/20/2007    Years since quitting: 14.6   Smokeless tobacco: Former    Types: Snuff    Quit date: 11/19/1994  Vaping Use   Vaping Use: Never used  Substance and Sexual Activity   Alcohol use: No   Drug use: No   Sexual activity: Not on file  Other Topics Concern   Not on file  Social History Narrative   Pt is adopted   Patient works in [place] 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 on Art therapist Insecurity: Not on file  Transportation Needs: Not on file  Physical Activity: Not on file  Stress: Not on file  Social Connections: Not on file    Allergies  Allergen Reactions   Clarithromycin Other (See Comments)    Adverse reaction w/ voriconazole    Rifaximin Other (See Comments)    Adverse reaction w/ voriconazole      Current Outpatient Medications:    albuterol (PROVENTIL) (2.5 MG/3ML) 0.083% nebulizer solution, Take 3 mLs (2.5 mg total) by nebulization every 4 (four) hours as needed for wheezing or shortness of breath., Disp: 75 mL, Rfl: 12   albuterol (VENTOLIN HFA) 108 (90 Base) MCG/ACT inhaler, Inhale 2 puffs into the lungs every 6 (six) hours as needed., Disp: 18 g, Rfl: 2   azithromycin (ZITHROMAX) 500 MG tablet, Take 1 tablet (500 mg total) by mouth daily., Disp: 30 tablet, Rfl: 11   Benralizumab (FASENRA PEN) 30 MG/ML SOAJ, Inject 1 mL (30 mg total) into the skin every 8 (eight) weeks., Disp: 1 mL, Rfl: 2   BREZTRI AEROSPHERE 160-9-4.8 MCG/ACT AERO, INHALE 2 PUFFS BY MOUTH TWICE DAILY (Patient taking differently: Inhale 2 puffs into the lungs 2 (two) times daily.), Disp: 10.7 g, Rfl: 5   CALCIUM CARBONATE-VITAMIN D PO, Take 2 tablets by mouth every morning., Disp: , Rfl:  chlorpheniramine-HYDROcodone 10-8 MG/5ML, Take 5 mLs by mouth every 12 (twelve) hours as needed for cough., Disp: 115 mL, Rfl: 0   doxycycline (VIBRA-TABS) 100 MG tablet,  Take 1 tablet (100 mg total) by mouth 2 (two) times daily., Disp: 10 tablet, Rfl: 0   EPINEPHrine 0.3 mg/0.3 mL IJ SOAJ injection, ADMINISTER 0.3 MG IN THE MUSCLE 1 TIME FOR 1 DOSE, Disp: 2 each, Rfl: 3   ethambutol (MYAMBUTOL) 400 MG tablet, Take by mouth., Disp: , Rfl:    feeding supplement, ENSURE ENLIVE, (ENSURE ENLIVE) LIQD, Take 237 mLs by mouth 3 (three) times daily between meals. (Patient taking differently: Take 237 mLs by mouth 2 (two) times daily as needed (meal supplement).), Disp: 237 mL, Rfl: 12   ferrous sulfate 325 (65 FE) MG tablet, Take 325 mg by mouth every morning., Disp: , Rfl:    folic acid (FOLVITE) 1 MG tablet, Take 1 mg by mouth every other day., Disp: , Rfl:    Isavuconazonium Sulfate (CRESEMBA) 186 MG CAPS, TAKE 2 CAPSULES BY MOUTH DAILY, Disp: 60 capsule, Rfl: 11   Menthol-Methyl Salicylate (SALONPAS PAIN RELIEF PATCH EX), Place 1-2 patches onto the skin daily. Back pain, Disp: , Rfl:    mirtazapine (REMERON SOL-TAB) 45 MG disintegrating tablet, Take 45 mg by mouth at bedtime., Disp: , Rfl:    omalizumab (XOLAIR) 150 MG/ML prefilled syringe, INJECT 300 MG INTO THE SKIN EVERY 14 (FOURTEEN) DAYS. IN ADDITION TO 75MG (TOTAL DOSE IS 375MG EVERY 14 DAYS)., Disp: 4 mL, Rfl: 5   omalizumab (XOLAIR) 75 MG/0.5ML prefilled syringe, INJECT 75 MG INTO THE SKIN EVERY 14 (FOURTEEN) DAYS. IN ADDITION TO 300MG (TOTAL DOSE IS 375MG EVERY 14 DAYS). DELIVER TO PATIENT'S HOME., Disp: 1 mL, Rfl: 5   predniSONE (DELTASONE) 10 MG tablet, Take 1 tablet (10 mg total) by mouth daily with breakfast., Disp: 90 tablet, Rfl: 3   sodium chloride HYPERTONIC 3 % nebulizer solution, USE NEBULIZEER DAILY AS DIRECTED, Disp: 750 mL, Rfl: 5   TIROSINT-SOL 88 MCG/ML SOLN, Take by mouth., Disp: , Rfl:    OXYGEN, Inhale 3 L into the lungs as needed (shortness of breath). (Patient not taking: Reported on 07/04/2022), Disp: , Rfl:   Review of Systems  Constitutional:  Negative for activity change, appetite change,  chills, diaphoresis, fatigue, fever and unexpected weight change.  HENT:  Negative for congestion, rhinorrhea, sinus pressure, sneezing, sore throat and trouble swallowing.   Eyes:  Negative for photophobia and visual disturbance.  Respiratory:  Positive for cough and shortness of breath. Negative for chest tightness, wheezing and stridor.   Cardiovascular:  Negative for chest pain, palpitations and leg swelling.  Gastrointestinal:  Negative for abdominal distention, abdominal pain, anal bleeding, blood in stool, constipation, diarrhea, nausea and vomiting.  Genitourinary:  Negative for difficulty urinating, dysuria, flank pain and hematuria.  Musculoskeletal:  Negative for arthralgias, back pain, gait problem, joint swelling and myalgias.  Skin:  Negative for color change, pallor, rash and wound.  Neurological:  Negative for dizziness, tremors, weakness and light-headedness.  Hematological:  Negative for adenopathy. Does not bruise/bleed easily.  Psychiatric/Behavioral:  Negative for agitation, behavioral problems, confusion, decreased concentration, dysphoric mood and sleep disturbance.        Objective:   Physical Exam Constitutional:      Appearance: He is well-developed.  HENT:     Head: Normocephalic and atraumatic.  Eyes:     Conjunctiva/sclera: Conjunctivae normal.  Cardiovascular:     Rate and Rhythm: Normal rate and regular  rhythm.  Pulmonary:     Effort: Prolonged expiration present. No respiratory distress.     Breath sounds: Rhonchi present. No wheezing.  Abdominal:     General: There is no distension.     Palpations: Abdomen is soft.  Musculoskeletal:        General: No tenderness. Normal range of motion.     Cervical back: Normal range of motion and neck supple.  Skin:    General: Skin is warm and dry.     Coloration: Skin is not pale.     Findings: No erythema or rash.  Neurological:     General: No focal deficit present.     Mental Status: He is alert and  oriented to person, place, and time.  Psychiatric:        Mood and Affect: Mood normal.        Behavior: Behavior normal.        Thought Content: Thought content normal.        Judgment: Judgment normal.           Assessment & Plan:  [place] and allergic bronchopulmonary aspergillosis:  Continue Cresemba.  Prescription  Rechecking LFTs  Mycobacterium AVM: Continue ethambutol and azithromycin prescriptions  Chronic hepatitis B without hepatic coma and without delta agent: Recheck CMP and hepatitis B DNA ultrasound the right upper quadrant showed no evidence of cirrhosis or hepatocellular carcinoma on screening in May 2023.  Continue biannual screening  Her weight trying to gain weight but having difficulty: Suggested potentially an appetite promoting drug might be helpful.       Aspergilloma and allergic bronchopulmonary aspergillosis: Continue his Cresemba.  Mycobacterium avium infection of the lungs: Continue ethambutol and azithromycin.

## 2022-07-04 NOTE — Addendum Note (Signed)
Addended by: Theresia Majors A on: 07/04/2022 04:20 PM   Modules accepted: Orders

## 2022-07-06 DIAGNOSIS — J9611 Chronic respiratory failure with hypoxia: Secondary | ICD-10-CM | POA: Diagnosis not present

## 2022-07-07 LAB — CBC WITH DIFFERENTIAL/PLATELET
Absolute Monocytes: 116 cells/uL — ABNORMAL LOW (ref 200–950)
Basophils Absolute: 11 cells/uL (ref 0–200)
Basophils Relative: 0.1 %
Eosinophils Absolute: 0 cells/uL — ABNORMAL LOW (ref 15–500)
Eosinophils Relative: 0 %
HCT: 41.5 % (ref 38.5–50.0)
Hemoglobin: 14 g/dL (ref 13.2–17.1)
Lymphs Abs: 399 cells/uL — ABNORMAL LOW (ref 850–3900)
MCH: 31.7 pg (ref 27.0–33.0)
MCHC: 33.7 g/dL (ref 32.0–36.0)
MCV: 93.9 fL (ref 80.0–100.0)
MPV: 8.6 fL (ref 7.5–12.5)
Monocytes Relative: 1.1 %
Neutro Abs: 9975 cells/uL — ABNORMAL HIGH (ref 1500–7800)
Neutrophils Relative %: 95 %
Platelets: 282 10*3/uL (ref 140–400)
RBC: 4.42 10*6/uL (ref 4.20–5.80)
RDW: 13.6 % (ref 11.0–15.0)
Total Lymphocyte: 3.8 %
WBC: 10.5 10*3/uL (ref 3.8–10.8)

## 2022-07-07 LAB — HEPATITIS B DNA, ULTRAQUANTITATIVE, PCR
Hepatitis B DNA: 3050 IU/mL — ABNORMAL HIGH
Hepatitis B virus DNA: 3.48 Log IU/mL — ABNORMAL HIGH

## 2022-07-07 LAB — COMPLETE METABOLIC PANEL WITH GFR
AG Ratio: 1.2 (calc) (ref 1.0–2.5)
ALT: 10 U/L (ref 9–46)
AST: 12 U/L (ref 10–40)
Albumin: 3.7 g/dL (ref 3.6–5.1)
Alkaline phosphatase (APISO): 82 U/L (ref 36–130)
BUN: 9 mg/dL (ref 7–25)
CO2: 27 mmol/L (ref 20–32)
Calcium: 8.6 mg/dL (ref 8.6–10.3)
Chloride: 102 mmol/L (ref 98–110)
Creat: 0.78 mg/dL (ref 0.60–1.29)
Globulin: 3 g/dL (calc) (ref 1.9–3.7)
Glucose, Bld: 128 mg/dL — ABNORMAL HIGH (ref 65–99)
Potassium: 3.9 mmol/L (ref 3.5–5.3)
Sodium: 138 mmol/L (ref 135–146)
Total Bilirubin: 0.4 mg/dL (ref 0.2–1.2)
Total Protein: 6.7 g/dL (ref 6.1–8.1)
eGFR: 110 mL/min/{1.73_m2} (ref >=60)

## 2022-07-17 ENCOUNTER — Other Ambulatory Visit (HOSPITAL_COMMUNITY): Payer: Self-pay

## 2022-07-18 ENCOUNTER — Other Ambulatory Visit (HOSPITAL_COMMUNITY): Payer: Self-pay

## 2022-07-25 DIAGNOSIS — E039 Hypothyroidism, unspecified: Secondary | ICD-10-CM | POA: Diagnosis not present

## 2022-07-26 ENCOUNTER — Other Ambulatory Visit (HOSPITAL_COMMUNITY): Payer: Self-pay

## 2022-07-31 ENCOUNTER — Other Ambulatory Visit (HOSPITAL_COMMUNITY): Payer: Self-pay

## 2022-08-01 ENCOUNTER — Other Ambulatory Visit (HOSPITAL_COMMUNITY): Payer: Self-pay

## 2022-08-06 ENCOUNTER — Telehealth (HOSPITAL_COMMUNITY): Payer: Self-pay

## 2022-08-06 DIAGNOSIS — J9611 Chronic respiratory failure with hypoxia: Secondary | ICD-10-CM | POA: Diagnosis not present

## 2022-08-06 DIAGNOSIS — L820 Inflamed seborrheic keratosis: Secondary | ICD-10-CM | POA: Diagnosis not present

## 2022-08-06 NOTE — Telephone Encounter (Signed)
No response from pt regarding PR.   Closed referral.

## 2022-08-07 ENCOUNTER — Other Ambulatory Visit (HOSPITAL_COMMUNITY): Payer: Self-pay

## 2022-08-08 ENCOUNTER — Other Ambulatory Visit (HOSPITAL_COMMUNITY): Payer: Self-pay

## 2022-08-13 ENCOUNTER — Other Ambulatory Visit (HOSPITAL_COMMUNITY): Payer: Self-pay

## 2022-08-14 ENCOUNTER — Other Ambulatory Visit (HOSPITAL_COMMUNITY): Payer: Self-pay

## 2022-08-15 ENCOUNTER — Other Ambulatory Visit (HOSPITAL_COMMUNITY): Payer: Self-pay

## 2022-08-20 ENCOUNTER — Other Ambulatory Visit (HOSPITAL_COMMUNITY): Payer: Self-pay

## 2022-09-03 ENCOUNTER — Other Ambulatory Visit: Payer: Self-pay | Admitting: Internal Medicine

## 2022-09-05 DIAGNOSIS — J9611 Chronic respiratory failure with hypoxia: Secondary | ICD-10-CM | POA: Diagnosis not present

## 2022-09-06 ENCOUNTER — Other Ambulatory Visit (HOSPITAL_COMMUNITY): Payer: Self-pay

## 2022-09-06 ENCOUNTER — Telehealth: Payer: Self-pay

## 2022-09-06 NOTE — Telephone Encounter (Signed)
[facility] Patient Advocate Encounter   Received notification from [facility] that prior authorization for [name] is required.   PA submitted on 09/06/22 Key [id number] Status is pending    [facility] will continue to follow.   Ileene Patrick, [facility] Patient [facility] for Infectious Disease Phone: [phone] Fax:  [phone]

## 2022-09-07 ENCOUNTER — Other Ambulatory Visit (HOSPITAL_COMMUNITY): Payer: Self-pay

## 2022-09-10 ENCOUNTER — Other Ambulatory Visit (HOSPITAL_COMMUNITY): Payer: Self-pay

## 2022-09-10 ENCOUNTER — Telehealth: Payer: Self-pay

## 2022-09-10 NOTE — Telephone Encounter (Signed)
[facility] Patient Advocate Encounter  Prior Authorization for Corky Mull has been approved.    PA# [id number] Effective dates: 09/06/22 through 03/04/23  Patients co-pay is $392.89.   [facility] will continue to follow.  Ileene Patrick, [facility] Patient [facility] for Infectious Disease Phone: [phone] Fax:  [phone]

## 2022-09-10 NOTE — Telephone Encounter (Signed)
Thanks Butch Penny!

## 2022-09-10 NOTE — Telephone Encounter (Signed)
[facility] Patient Advocate Encounter   Was successful in obtaining a [place] copay card for [place].  This copay card will make the patients copay $25.00.  I have spoken with the patient.    The billing information is as follows and has been shared with [facility].  RxBin: [id number] PCN: [id number] Member ID: [id number] Group ID: [id number]  Ileene Patrick, [place] Patient [place] for Infectious Disease Phone: [phone] Fax:  [phone]

## 2022-09-11 ENCOUNTER — Other Ambulatory Visit (HOSPITAL_COMMUNITY): Payer: Self-pay

## 2022-09-12 ENCOUNTER — Other Ambulatory Visit (HOSPITAL_COMMUNITY): Payer: Self-pay

## 2022-09-13 ENCOUNTER — Other Ambulatory Visit (HOSPITAL_COMMUNITY): Payer: Self-pay

## 2022-09-14 ENCOUNTER — Telehealth: Payer: Self-pay

## 2022-09-14 ENCOUNTER — Other Ambulatory Visit (HOSPITAL_COMMUNITY): Payer: Self-pay

## 2022-09-14 NOTE — Telephone Encounter (Signed)
Received notification from [facility] that pt has acquired new insurance and new PA's would be required for both medications.   Submitted a Prior Authorization request to [facility] for [facility] via CoverMyMeds. Will update once we receive a response.  Key: [id number]    Submitted a Prior Authorization request to [facility] for XOLAIR via CoverMyMeds. Will update once we receive a response.  Key: [id number]

## 2022-09-17 ENCOUNTER — Other Ambulatory Visit (HOSPITAL_COMMUNITY): Payer: Self-pay

## 2022-09-17 NOTE — Telephone Encounter (Signed)
Will work on urgent Clinical biochemist for both biologics.   Spoke with patient regarding his medications to ensure he does not lose access. He is due for next Fasenra on 10/10/2022. He is due for Xolair every 2 weeks (next dose is due on 09/19/22). Will provide sample for 2 week supply since we are low.  Patient will stop by on  09/18/22 for Xolair sample (2 x 114m PFS and 1 x 756mPFS)  DeKnox SalivaPharmD, MPH, BCPS, CPP Clinical Pharmacist (Rheumatology and Pulmonology)

## 2022-09-17 NOTE — Telephone Encounter (Signed)
Patient picked up Xolair sample:  Xolair 128m/ml PFS x 2 PFS NDC: 518563-1497-02Lot # [id number]: 11/2022  Xolair 781mml PFS x 1 PFS NDC: 5050277-4128-78ot # [id number]: 11/2022  DeKnox SalivaPharmD, MPH, BCPS, CPP Clinical Pharmacist (Rheumatology and Pulmonology)

## 2022-09-17 NOTE — Telephone Encounter (Signed)
Received initial PA denials for both medications. Letters have been placed on [name]'s desk for initiation of appeal process. Therigy updated.  May need to discuss providing sample to pt in the interim, current sample inventory on-hand is: #2 Fasenra pen #8 Xolair 12m #2 Xolair 743m

## 2022-09-19 ENCOUNTER — Ambulatory Visit: Payer: Self-pay

## 2022-09-19 ENCOUNTER — Other Ambulatory Visit (HOSPITAL_COMMUNITY): Payer: Self-pay

## 2022-09-19 ENCOUNTER — Ambulatory Visit: Payer: BC Managed Care – PPO | Admitting: Adult Health

## 2022-09-24 ENCOUNTER — Other Ambulatory Visit (HOSPITAL_COMMUNITY): Payer: Self-pay

## 2022-09-25 ENCOUNTER — Other Ambulatory Visit (HOSPITAL_COMMUNITY): Payer: Self-pay

## 2022-09-26 ENCOUNTER — Other Ambulatory Visit (HOSPITAL_COMMUNITY): Payer: Self-pay

## 2022-09-26 NOTE — Telephone Encounter (Signed)
Returned call to patient regarding Xolair samples.  We are awaiting Xolair samples  Knox Saliva, PharmD, MPH, BCPS, CPP Clinical Pharmacist (Rheumatology and Pulmonology)

## 2022-09-26 NOTE — Telephone Encounter (Signed)
Pt called the office wanting to know if he could get a sample of Xolair as he said the PA did not go through and therefore he does not have an injection. Routing to pharmacy team for review. Please advise.

## 2022-09-27 ENCOUNTER — Ambulatory Visit: Payer: Self-pay

## 2022-09-27 ENCOUNTER — Other Ambulatory Visit (HOSPITAL_COMMUNITY): Payer: Self-pay

## 2022-09-27 ENCOUNTER — Ambulatory Visit: Payer: BC Managed Care – PPO | Admitting: Primary Care

## 2022-09-27 ENCOUNTER — Ambulatory Visit: Payer: BC Managed Care – PPO | Admitting: Adult Health

## 2022-10-01 NOTE — Telephone Encounter (Addendum)
Submitted an URGENT appeal to [employer] for [employer].  Reference # [id number] Phone: [phone] Fax: [phone]  Submitted an URGENT appeal to [facility] for [facility].  Reference # [id number] Phone: [phone] Fax: [phone]

## 2022-10-02 ENCOUNTER — Ambulatory Visit (INDEPENDENT_AMBULATORY_CARE_PROVIDER_SITE_OTHER): Payer: BC Managed Care – PPO

## 2022-10-02 ENCOUNTER — Encounter: Payer: Self-pay | Admitting: Primary Care

## 2022-10-02 ENCOUNTER — Ambulatory Visit: Payer: BC Managed Care – PPO | Admitting: Primary Care

## 2022-10-02 ENCOUNTER — Other Ambulatory Visit: Payer: Self-pay

## 2022-10-02 VITALS — BP 122/84 | HR 82 | Temp 98.5°F | Ht 68.0 in | Wt 114.0 lb

## 2022-10-02 DIAGNOSIS — B440 Invasive pulmonary aspergillosis: Secondary | ICD-10-CM

## 2022-10-02 DIAGNOSIS — J4550 Severe persistent asthma, uncomplicated: Secondary | ICD-10-CM

## 2022-10-02 DIAGNOSIS — J8410 Pulmonary fibrosis, unspecified: Secondary | ICD-10-CM | POA: Diagnosis not present

## 2022-10-02 DIAGNOSIS — B4481 Allergic bronchopulmonary aspergillosis: Secondary | ICD-10-CM

## 2022-10-02 DIAGNOSIS — Z23 Encounter for immunization: Secondary | ICD-10-CM

## 2022-10-02 DIAGNOSIS — R079 Chest pain, unspecified: Secondary | ICD-10-CM | POA: Diagnosis not present

## 2022-10-02 DIAGNOSIS — A310 Pulmonary mycobacterial infection: Secondary | ICD-10-CM | POA: Diagnosis not present

## 2022-10-02 DIAGNOSIS — J449 Chronic obstructive pulmonary disease, unspecified: Secondary | ICD-10-CM | POA: Diagnosis not present

## 2022-10-02 NOTE — Patient Instructions (Addendum)
Recommendations:  Continue Breztri Aerosphere 2 puffs morning and evening Continue 10 mg of prednisone daily Continue Xolair and Fasenra as directed Continue azithromycin, Ethambutol and Cresemba with Dr Drucilla Schmidt   Orders: CXR today  Follow-up: January or February with [name] please (if unable to get visit with him put in recall)

## 2022-10-02 NOTE — Progress Notes (Signed)
_0  ID: [name], male    DOB: 1976-10-06, 46 y.o.   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  Chronic respiratory failure on oxygen (started in February 2022) History of recurrent hemoptysis with severe episode in 2016 requiring interventional guided embolization and in April 2023 with respiratory distress requiring intubation. 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  2016 Hospitalzed with hemoptysis >IR guided embolization . FOB/BAL + Saccharomyces on fungal fx . Serum Galactomannan was neg.  [name] evaluation 04/2017 for Transplant -rejected. Felt to early for transplant - recommended pre-transplant weight goal 128 lbs for a BMI > 19  05/09/2022 Follow up ; severe persistent asthma, ABPA, aspergilloma, MAC, hemoptysis, chronic respiratory failure Patient returns for 1 month follow-up.  Patient was seen last visit for a posthospital follow-up.  He had a very complicated hospitalization with severe hemoptysis and acute respiratory distress.  He did require intubation and vent support.  He underwent bronchoscopy with no evidence of active bleeding.  He was treated with Tranexamic acid .  Found to have MSSA pneumonia.  BAL  showed Staph aureus (resistant to Cipro, clindamycin, erythromycin and Bactrim) AFB was negative.  Fungal cultures were negative.  Cytology showed no malignant cells.  Numerous acute inflammatory cells. Patient says he is doing about the same he has had no recurrence of hemoptysis but continues to feel very weak low energy decreased activity tolerance and endurance.  He remains on oxygen 2 L with activity and at bedtime.  Feels that he needs oxygen more during the daytime when he is doing things especially at work.  Patient works part-time at [employer].  He typically can work about 20 to 25 hours.  But is very hard on him.  He wears out very easily Patient is trying to eat a high-protein diet.  He is starting to use Ensure but continues to have weight loss.  Current weight is at 109 pounds.  Patient has applied for disability but for some reason has not been approved despite having significant comorbidities and struggles daily.  Patient says he needs a portable oxygen tank he has a trouble carrying his E tanks to work.  Today in office patient O2 saturations are 87% on room air.  Requires 2 L pulsed oxygen on portable POC to maintain O2 saturations greater than 88 to 90%. Patient lives alone and is able to drive.  Gets winded easily.  Patient does have parents that are close by better not in good health. Patient continues to do triple therapy with Breztri inhaler.  He is on chronic steroids with prednisone 10 mg daily.  He is using his flutter valve.  Is unable to use vest therapy due to severe back and rib pain with use.  Patient is using hypertonic nebs 1-2 daily.  Continues to have a daily cough multiple times throughout the day with thick mucus.  Patient says he is able to get up the mucus easily but it is very thick and productive. CT chest done on May 02, 2022 showed very large bullae at the lung apices, previous debris and filling defect redness resolved in the right apical bullae.  This does appear  to be improved from previous scan.  Along the inferior extension into the right lower lobe with some cavitation and internal debris and material with increased gas component compared to previous scan.  Measuring 4.8 x 2.0 cm previous was 4.5 x 2.4 cm.  Left apical bullae with debris and density mostly gas-filled bullae represents progressive superinfection of the bulla.  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  We discussed today referral back to [facility] for consideration of lung transplant he has been there previously also referral for second opinion patient declines at this time.  Patient says that the process is very expensive and time-consuming.  He prefers to stay locally at present time. Patient says when he went for lung transplant work-up previously he did not feel like the benefits outweigh the risk and cost.   06/27/2022- History of severe persistent asthma, ABPA, aspergilloma, MAC, hemoptysis, chronic respiratory failure. Patient presents today for 6-week follow-up.  Since last overview he received [employer] portable oxygen concentrator with [employer]. Sputum culture from June came back showing Rhine. He was treated with course of Doxycycline. Patient is at his baseline today. No acute respiratory complaints. Cough has improved. He keeps a chronic cough every once in awhile. Breathing is a little better but not 100%. He is maintained on Breztri Aeropshere two puffs twice daily, 88m prednisone daily, Xolair and Fasenra. He is also on Azithromycin 5040mdaily per Dr. VaTommy MedalHe is not wearing oxygen today, O2 96% RA. [facility] past d.t expense and travel, he would prefer to stay local. He applied for disability in the past and was denied despite having several severe and chronic medical conditions.   CT chest done on  May 02, 2022 showed very large bullae at the lung apices, previous debris and filling defect redness resolved in the right apical bullae.  This does appear to be improved from previous scan.  Along the inferior extension into the right lower lobe with some cavitation and internal debris and material with increased gas component compared to previous scan.  Measuring 4.8 x 2.0 cm previous was 4.5 x 2.4 cm.  Left apical bullae with debris and density mostly gas-filled bullae represents progressive superinfection of the bulla.  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   10/02/2022 - INTERIM  Patient presents today for 3 month follow-up. Follows with Dr. RaChase CallerHistory severe persistent asthma, ABPA, aspergilloma, MAC, hemoptysis, chronic respiratory failure.  Since last visit he has had no hospitalizations or flare ups. Maintained on both FaBerna Buend Xolair, our pharmacy team is working on suNeurosurgeonor urgent appeals. Next due for Fasenra injection on 10/10/22 and Xolair 10/03/22. Awaiting Xolair samples   He is doing alright today, breathing is baseline. He has his good days and bad days. He has a daily chronic productive cough without purulence or blood. Cough is no worse than normal. He experiences dyspnea symptoms with activity. He can do  most all of his ADLs. He had a low grade temp 100.2 several weeks ago that has gone away. Fever has resolved. Denies fever chest tightness or wheezing.    TEST/EVENTS :  7/13 Ig E 800s ( ~4000 2012 )  12/10/11 Sputum AFB  Smear and culture negative (prelim) 12/10/11 Fungal sputum culture - negative (final) 01/30/2012 spirometry - fev1 1.7L/41%, ratip 52 - BEST EVER 01/30/2012 walking desaturation test: 185 feet x 3 laps: did not deaturate  05/2014 >Spiro fev1 1.7L/40%, ratio 55  Spirometry 06/25/2017 showed FEV1 at 28%, ratio 46, FVC 50%. CT chest  July 2021 showed chronic narrowing and occlusion of several of the right upper lobe segmental and subsegmental pulmonary arteries additional filling defects in the left upper lobe segmental and subsegmental branches.  Extensive bullous disease.  Architectural distortion most suggestive of chronic embolus/occlusion, right atrial enlargement , Groundglass and consolidative opacity in the left lower and right lower periphery   Sputum culture July 2021 + for Klebsiella pneumonia -pansensitive except for ampicillin.   August 2021 2D echo showed EF 80%, grade 1 diastolic dysfunction mildly elevated pulmonary artery systolic pressure at 39 mmHg    Allergies  Allergen Reactions   Clarithromycin Other (See Comments)    Adverse reaction w/ voriconazole    Rifaximin Other (See Comments)    Adverse reaction w/ voriconazole     Immunization History  Administered Date(s) Administered   COVID-19, mRNA, vaccine(Comirnaty)12 years and older 10/02/2022   DTaP 11/19/2010   Influenza Split 10/29/2011, 08/14/2012   Influenza Whole 09/19/2010   Influenza,inj,Quad PF,6+ Mos 11/23/2013, 09/28/2014, 09/06/2015, 08/27/2016, 08/10/2017, 09/09/2018, 08/06/2019, 08/10/2020, 09/27/2021, 10/02/2022   Influenza-Unspecified 10/29/2011, 08/14/2012, 11/23/2013, 09/28/2014, 08/27/2016, 08/10/2017, 09/09/2018, 08/06/2019, 08/10/2020   PFIZER Comirnaty(Gray Top)Covid-19 Tri-Sucrose Vaccine 12/28/2020   PFIZER(Purple Top)SARS-COV-2 Vaccination 02/18/2020, 03/14/2020, 08/10/2020   PNEUMOCOCCAL CONJUGATE-20 07/04/2022   Pfizer Covid-19 Vaccine Bivalent Booster 45yr & up 10/04/2021, 04/04/2022   Pneumococcal Conjugate-13 04/29/2017, 04/29/2017   Pneumococcal Polysaccharide-23 12/17/2010   Tdap 09/27/2021    Past Medical History:  Diagnosis Date   Anemia    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[facility]    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   CAP (community acquired pneumonia) 03/09/2022   Chronic viral hepatitis B without delta-agent ([facility] [place]  11/23/2020   Drug rash 12/16/2017   Dyspnea    Esophageal reflux    very rare   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   Lung disease, bullous ([place]    MAI (mycobacterium avium-intracellulare) ([facility]    Mold exposure 12/05/2015   Photosensitivity dermatitis 09/16/2017   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[facility] 03/14/2022   Rib fracture 02/08/2021   Solar lentigo 08/08/2015   Tension pneumothorax 09/04/2018   Vaccine counseling 12/28/2020   Weight loss 10/15/2016    Tobacco History: Social History   Tobacco Use  Smoking Status Former   Packs/day: 0.70   Years: 19.00   Total pack years: 13.30   Types: Cigarettes   Quit date: 11/20/2007   Years since quitting: 14.8  Smokeless Tobacco Former   Types: Snuff   Quit date: 11/19/1994   Counseling given: Not Answered   Outpatient Medications Prior to Visit  Medication Sig Dispense Refill   albuterol (PROVENTIL) (2.5 MG/3ML) 0.083% nebulizer solution Take 3 mLs (2.5 mg total) by nebulization every 4 (four) hours as needed for wheezing or shortness of breath. 75 mL 12   albuterol (VENTOLIN HFA) 108 (90 Base) MCG/ACT inhaler INHALE 2 PUFFS INTO THE LUNGS EVERY 6 HOURS AS NEEDED 18  g 2   azithromycin (ZITHROMAX) 500 MG tablet Take 1 tablet (500 mg total) by mouth daily. 30 tablet 11   Benralizumab (FASENRA PEN) 30 MG/ML SOAJ Inject 1 mL (30 mg total) into the skin every 8 (eight) weeks. 1 mL 2   BREZTRI AEROSPHERE 160-9-4.8 MCG/ACT AERO INHALE 2 PUFFS BY MOUTH TWICE DAILY (Patient taking differently: Inhale 2 puffs into the lungs 2 (two) times daily.) 10.7 g 5   CALCIUM CARBONATE-VITAMIN D PO Take 2 tablets by mouth every morning.     chlorpheniramine-HYDROcodone 10-8 MG/5ML Take 5 mLs by mouth every 12 (twelve) hours as needed for cough. 115 mL 0   EPINEPHrine 0.3 mg/0.3 mL IJ SOAJ injection ADMINISTER 0.3 MG IN THE MUSCLE 1 TIME FOR 1 DOSE 2 each 3   feeding supplement, ENSURE ENLIVE, (ENSURE ENLIVE) LIQD Take 237  mLs by mouth 3 (three) times daily between meals. (Patient taking differently: Take 237 mLs by mouth 2 (two) times daily as needed (meal supplement).) 237 mL 12   ferrous sulfate 325 (65 FE) MG tablet Take 325 mg by mouth every morning.     folic acid (FOLVITE) 1 MG tablet Take 1 mg by mouth every other day.     Isavuconazonium Sulfate (CRESEMBA) 186 MG CAPS TAKE 2 CAPSULES BY MOUTH DAILY 60 capsule 11   Menthol-Methyl Salicylate (SALONPAS PAIN RELIEF PATCH EX) Place 1-2 patches onto the skin daily. Back pain     mirtazapine (REMERON SOL-TAB) 45 MG disintegrating tablet Take 45 mg by mouth at bedtime.     omalizumab (XOLAIR) 150 MG/ML prefilled syringe INJECT 300 MG INTO THE SKIN EVERY 14 (FOURTEEN) DAYS. IN ADDITION TO 75MG (TOTAL DOSE IS 375MG EVERY 14 DAYS). 4 mL 5   omalizumab (XOLAIR) 75 MG/0.5ML prefilled syringe INJECT 75 MG INTO THE SKIN EVERY 14 (FOURTEEN) DAYS. IN ADDITION TO 300MG (TOTAL DOSE IS 375MG EVERY 14 DAYS). DELIVER TO PATIENT'S HOME. 1 mL 5   OXYGEN Inhale 3 L into the lungs as needed (shortness of breath).     predniSONE (DELTASONE) 10 MG tablet Take 1 tablet (10 mg total) by mouth daily with breakfast. 90 tablet 3   sodium chloride HYPERTONIC 3 % nebulizer solution USE NEBULIZEER DAILY AS DIRECTED 750 mL 5   TIROSINT-SOL 88 MCG/ML SOLN Take by mouth.     ethambutol (MYAMBUTOL) 400 MG tablet Take 2.5 tablets (1,000 mg total) by mouth daily. 200 tablet 11   No facility-administered medications prior to visit.    Review of Systems  Review of Systems  Constitutional: Negative.   HENT: Negative.    Respiratory: Negative.         Chronic productive cough     Physical Exam  BP 122/84 (BP Location: Right Arm, Patient Position: Sitting, Cuff Size: Normal)   Pulse 82   Temp 98.5 F (36.9 C) (Oral)   Ht _0  (1.727 m)   Wt 114 lb (51.7 kg)   SpO2 96%   BMI 17.33 kg/m  Physical Exam Constitutional:      General: He is not in acute distress.    Appearance: Normal  appearance. He is not ill-appearing.     Comments: Underweight  HENT:     Head: Normocephalic and atraumatic.     Mouth/Throat:     Mouth: Mucous membranes are moist.     Pharynx: Oropharynx is clear.  Cardiovascular:     Rate and Rhythm: Normal rate and regular rhythm.  Pulmonary:     Effort: Pulmonary effort  is normal.     Breath sounds: Normal breath sounds. No wheezing or rales.  Musculoskeletal:        General: Normal range of motion.  Skin:    General: Skin is warm and dry.  Neurological:     General: No focal deficit present.     Mental Status: He is alert and oriented to person, place, and time. Mental status is at baseline.  Psychiatric:        Mood and Affect: Mood normal.        Behavior: Behavior normal.        Thought Content: Thought content normal.        Judgment: Judgment normal.      Lab Results:  CBC    Component Value Date/Time   WBC 10.5 07/04/2022 1211   RBC 4.42 07/04/2022 1211   HGB 14.0 07/04/2022 1211   HCT 41.5 07/04/2022 1211   PLT 282 07/04/2022 1211   MCV 93.9 07/04/2022 1211   MCH 31.7 07/04/2022 1211   MCHC 33.7 07/04/2022 1211   RDW 13.6 07/04/2022 1211   LYMPHSABS 399 (L) 07/04/2022 1211   MONOABS 0.5 03/11/2022 0552   EOSABS 0 (L) 07/04/2022 1211   BASOSABS 11 07/04/2022 1211    BMET    Component Value Date/Time   NA 138 07/04/2022 1211   K 3.9 07/04/2022 1211   CL 102 07/04/2022 1211   CO2 27 07/04/2022 1211   GLUCOSE 128 (H) 07/04/2022 1211   BUN 9 07/04/2022 1211   CREATININE 0.78 07/04/2022 1211   CALCIUM 8.6 07/04/2022 1211   GFRNONAA >60 03/15/2022 0650   GFRNONAA 107 11/23/2020 1202   GFRAA 124 11/23/2020 1202    BNP    Component Value Date/Time   BNP 73.0 03/08/2022 1303    ProBNP    Component Value Date/Time   PROBNP 97.0 03/07/2011 0918    Imaging: DG Chest 2 View  Result Date: 10/02/2022 CLINICAL DATA:  Asthma, allergic bronchopulmonary aspergillosis, MAC infection EXAM: CHEST - 2 VIEW  COMPARISON:  Previous studies including chest radiographs done on 04/04/2022 and CT done on 05/02/2022 FINDINGS: Cardiac size is within normal limits. There are multiple bullae in the apices. Increased interstitial markings and patchy opacities are seen in both lungs, more so in the upper lung fields. There is decreased volume in the upper lung fields with elevation of hilar regions. There is new patchy infiltrate in right mid lung field. There is blunting of right lateral CP angle. Left lateral CP angle is clear. There is no definite pneumothorax. Evaluation for small pneumothorax is limited by multiple bullae in the upper lung fields. IMPRESSION: COPD. Pulmonary fibrosis. There is new alveolar infiltrate in right mid lung field suggesting superimposed atelectasis/pneumonia. Possible minimal right pleural effusion. Electronically Signed   By: Elmer Picker M.D.   On: 10/02/2022 10:37     Assessment & Plan:   Severe persistent asthma - Severe persistent asthma with allergic phenotype with significant symptom burden. Currently controlled on present therapy. No recent hospitalizations or exacerbations. ACT score 14. Continue Breztri Aerosphere, prednisone 24m daily, Fasenra and Xolair injections. Pharmacy is working on appeals for medication. He is getting samples with our office.   ABPA (allergic bronchopulmonary aspergillosis) (HCC) -Continue azithromycin, Ethambutol and Cresemba with Dr VDrucilla Schmidt Patient has a chronic cough with mucus production. He had low grade temp several weeks ago, checking CXR today.   EMartyn Ehrich NP 10/07/2022

## 2022-10-03 MED ORDER — DOXYCYCLINE HYCLATE 100 MG PO TABS: 100.0000 mg | ORAL_TABLET | Freq: Two times a day (BID) | ORAL | 0 refills | Status: DC

## 2022-10-03 NOTE — Progress Notes (Deleted)
Spoke with patient. He had recent low grade fever with chronic productive cough. Treating for suspected RML pneumonia. Hx staphylococcus aureus in sputum, sensitive to tetracycline. RX doxycyline 124m BID x 7 days sent to pharmacy.

## 2022-10-04 ENCOUNTER — Other Ambulatory Visit (HOSPITAL_COMMUNITY): Payer: Self-pay

## 2022-10-04 ENCOUNTER — Telehealth: Payer: Self-pay

## 2022-10-04 DIAGNOSIS — B449 Aspergillosis, unspecified: Secondary | ICD-10-CM

## 2022-10-04 MED ORDER — ETHAMBUTOL HCL 400 MG PO TABS: 1000.0000 mg | ORAL_TABLET | Freq: Every day | ORAL | 3 refills | Status: DC

## 2022-10-04 NOTE — Telephone Encounter (Signed)
Patient called, unable to get his ethambutol due to an insurance issue. Per Ileene Patrick, will need to send in 400 mg tablets #225 for 90 day supply that will cost $75. Patient aware.   He is not sure if his new insurance is also affecting his azithromycin prescription, but will call us if he has trouble getting this medication.   Beryle Flock, RN

## 2022-10-06 DIAGNOSIS — J9611 Chronic respiratory failure with hypoxia: Secondary | ICD-10-CM | POA: Diagnosis not present

## 2022-10-07 NOTE — Assessment & Plan Note (Addendum)
-  Severe persistent asthma with allergic phenotype with significant symptom burden. Currently controlled on present therapy. No recent hospitalizations or exacerbations. ACT score 14. Continue Breztri Aerosphere, prednisone 75m daily, Fasenra and Xolair injections. Pharmacy is working on appeals for medication. He is getting samples with our office.

## 2022-10-07 NOTE — Assessment & Plan Note (Addendum)
-  Continue azithromycin, Ethambutol and Cresemba with Dr Drucilla Schmidt. Patient has a chronic cough with mucus production. He had low grade temp several weeks ago, checking CXR today.

## 2022-10-08 ENCOUNTER — Other Ambulatory Visit (HOSPITAL_COMMUNITY): Payer: Self-pay

## 2022-10-09 NOTE — Telephone Encounter (Addendum)
Received Xolair samples. Patient advised that he can stop by clinic to pick up 2 week supply of Xolair. He is going to stop by clinic on 09/2222 to pick Xolair sample. Will not need ice pack since he is already overude  2 x 380m PFS  1 x 723mPFS  Send order request for more Xolair samples.  DeKnox SalivaPharmD, MPH, BCPS, CPP Clinical Pharmacist (Rheumatology and Pulmonology)

## 2022-10-10 ENCOUNTER — Other Ambulatory Visit (HOSPITAL_COMMUNITY): Payer: Self-pay

## 2022-10-10 NOTE — Telephone Encounter (Signed)
Patient picked up Xolair sample today.   2 x 330m PFS 1 x 743mPFS  DeKnox SalivaPharmD, MPH, BCPS, CPP Clinical Pharmacist (Rheumatology and Pulmonology)

## 2022-10-12 ENCOUNTER — Other Ambulatory Visit (HOSPITAL_COMMUNITY): Payer: Self-pay

## 2022-10-15 ENCOUNTER — Other Ambulatory Visit (HOSPITAL_COMMUNITY): Payer: Self-pay

## 2022-10-15 NOTE — Telephone Encounter (Signed)
Completed Member Appeal Authorization Form for [employer] and faxed to [facility]. Awaiting appeal ruling.   Phone: [phone] Fax: [phone] Appeal Number: [id number]  [place] of Pharmacy PharmD Candidate [phone]

## 2022-10-15 NOTE — Telephone Encounter (Signed)
Called [employer] to follow-up on PA APPEAL for [place]. Received the following information:  FASENRA has been down-graded from an URGENT appeal to a STANDARD appeal, which has a 30 day turn around time. Appeal must be completed by 10/31/2022. Appeal Number: [id number]  [employer] appeal has been deemed an invalid request, as the member form in invalid (no information about what makes it invalid). Rep faxed [place] the member form again to fill out.  Appeal Number: [id number]  Phone: [place] [facility] Pharm[facility] PharmD Candidate [phone]

## 2022-10-18 ENCOUNTER — Other Ambulatory Visit (HOSPITAL_COMMUNITY): Payer: Self-pay

## 2022-10-19 ENCOUNTER — Telehealth: Payer: Self-pay | Admitting: Pharmacist

## 2022-10-23 NOTE — Telephone Encounter (Signed)
Pt stopped by clinic for update. His next Xolair dose is coming due, samples have been provided.

## 2022-11-05 ENCOUNTER — Telehealth: Payer: Self-pay | Admitting: Internal Medicine

## 2022-11-05 DIAGNOSIS — J9611 Chronic respiratory failure with hypoxia: Secondary | ICD-10-CM | POA: Diagnosis not present

## 2022-11-06 NOTE — Telephone Encounter (Signed)
Returned call to patient. He will need to stop by clinic for samples as appeal continues to remain pending. I have refaxed the entire appeal document again today.  Patient to stop by tomorrow afternoon to pick up Xolair sample. Will plan to complete [employer] PAP application as well  Knox Saliva, PharmD, MPH, BCPS, CPP Clinical Pharmacist (Rheumatology and Pulmonology)

## 2022-11-06 NOTE — Telephone Encounter (Signed)
Called [place] for appeal determination:  FASENRA denial has been OVERTURNED. [name] is APPROVED from 10/31/2022 through 10/31/2023.  Physical letter has been sent to clinic and patient.  Case # [id number] Appeal Number: [id number]   [employer]vid Right - they never received appeal. Refaxed appeal again today requesting EXPEDITED review.  Appeal Number: [id number]  Phone: [phone], option 3  Patient states he will stop by tomorrow afternoon 11/07/22 to pick up Xolair sample. We will need to complete [facility] patient assistance paperwork at this point. Provider portion placed in Dr. Golden Pop mailbox for signature today. Patient portion in AWAITING RESPONSE folder in pharmacy office.  Knox Saliva, PharmD, MPH, BCPS, CPP Clinical Pharmacist (Rheumatology and Pulmonology)

## 2022-11-06 NOTE — Telephone Encounter (Signed)
2nd call. Please contact PT:   Pt needs to know if auth for Xolair has gone through. If not, he will need a sample- he is due Wednesday/ Thursday. Please call back- [phone].

## 2022-11-08 NOTE — Telephone Encounter (Signed)
Patient picked up sample of Xolair x 2 weeks on 11/07/22 (total dose 320m)  He signed patient portion of [employer] PAP application. Pending [provider portion.  DKnox Saliva PharmD, MPH, BCPS, CPP Clinical Pharmacist (Rheumatology and Pulmonology)

## 2022-11-14 ENCOUNTER — Other Ambulatory Visit (HOSPITAL_COMMUNITY): Payer: Self-pay

## 2022-11-14 DIAGNOSIS — L570 Actinic keratosis: Secondary | ICD-10-CM | POA: Diagnosis not present

## 2022-11-14 DIAGNOSIS — L820 Inflamed seborrheic keratosis: Secondary | ICD-10-CM | POA: Diagnosis not present

## 2022-11-14 NOTE — Telephone Encounter (Signed)
Submitted Patient Assistance Application to [place] for [place] along with provider portion, patient portion, PA denial, and med list Will update patient when we receive a response.  Fax# [phone] Phone# [phone]  Still awaiting Xolair appeal determination from [place], PharmD, MPH, BCPS, CPP Clinical Pharmacist (Rheumatology and Pulmonology)

## 2022-11-16 ENCOUNTER — Other Ambulatory Visit: Payer: Self-pay | Admitting: Internal Medicine

## 2022-11-16 ENCOUNTER — Other Ambulatory Visit: Payer: Self-pay

## 2022-11-16 ENCOUNTER — Other Ambulatory Visit (HOSPITAL_COMMUNITY): Payer: Self-pay

## 2022-11-21 ENCOUNTER — Telehealth: Payer: Self-pay

## 2022-11-21 NOTE — Telephone Encounter (Signed)
PA request received via CMM for Breztri Aerosphere 160-9-4.8MCG/ACT aerosol through [employer].  PA has been submitted and is awaiting determination.  Key: [id number]

## 2022-11-22 ENCOUNTER — Other Ambulatory Visit: Payer: Self-pay | Admitting: Adult Health

## 2022-11-22 ENCOUNTER — Other Ambulatory Visit (HOSPITAL_COMMUNITY): Payer: Self-pay

## 2022-11-22 DIAGNOSIS — J4550 Severe persistent asthma, uncomplicated: Secondary | ICD-10-CM

## 2022-11-22 MED ORDER — FASENRA PEN 30 MG/ML ~~LOC~~ SOAJ
30.0000 mg | SUBCUTANEOUS | 1 refills | Status: DC
  Filled 2022-11-22 – 2023-01-15 (×4): qty 1, 56d supply, fill #0
  Filled 2023-03-11: qty 1, 56d supply, fill #1

## 2022-11-22 NOTE — Telephone Encounter (Signed)
Spoke with [employer] Rep regarding submission status, he states that pt WILL be approved for the program--however they will require the appeal determination letter from the insurance before they can officially approve him.  Will call [facility] and attempt to obtain a copy of the appeal denial.  [phone]  Customer Service#: [phone]  [email or web address] >Members tab>Forms/resources> 1st form: Appeals form 2nd form: Member appeal representation authoriztion form Third form has been sent to the patient and cannot be sent elsewhere  All three forms are required to process   Call ref# [id number]

## 2022-11-22 NOTE — Telephone Encounter (Signed)
Refill sent for [facility] to [facility]: [phone]   Dose: 30 mg SQ every 8 weeks  Last OV: 10/02/2022 Provider: [name]   Next OV: 12/04/2022  Knox Saliva, PharmD, MPH, BCPS Clinical Pharmacist (Rheumatology and Pulmonology)

## 2022-11-23 ENCOUNTER — Other Ambulatory Visit (HOSPITAL_COMMUNITY): Payer: Self-pay

## 2022-11-23 ENCOUNTER — Other Ambulatory Visit: Payer: Self-pay

## 2022-11-23 NOTE — Telephone Encounter (Signed)
PA currently not needed, covered by current plan.

## 2022-11-23 NOTE — Telephone Encounter (Signed)
Received a call from a representative from [facility] (presumably with the appeals team?) this morning. She stated that she was calling for additional information about the patients therapy, stating that there were TWO APPROVALS on file--however [employer] policy does not allow two biologic medications to be used simultaneously without going through *extensive* appeal processes, as we have done in the past.  I informed the rep today that the rep I spoke with yesterday had mentioned that there was an approval for the Xolair dated on 12/13, however she could find no documentation or anything substantial that further corroborates the approval. I also added that the test claim I attempted yesterday afternoon rejected due to requiring a PA, to which she informed me that the PA approval was never fully loaded in due to requiring follow-up per their policy regarding multiple biologics.  We once again explained (despite this already being discussed in both the appeal letter and every single phone call thus far) that this pt NEEDS to be on both biologics and that he has historically been hospitalized every time he has an interruption of therapy with either medication, without fail. The rep stated that she would take this "new information" to her manager and discuss. We did discuss that if it came down to ultimately allowing only ONE medication to remain approved then we would like it to be [place].   Regardless, I obtained the relevant information for both appeal approvals however there was no date range listed on the Xolair approval letter. The rep stated that she would (hopefully) call me back before the end of today with an update on the process. I called the pt and provided update, he verbalized understanding. Will await f/u.  [name] denial overturned on 10/31/22 and is approved from 10/31/22 to 10/31/23.  Appeal ID: [id number] Ref#: [id number]  Xolair denial overturned on 10/31/22 but does not yet have a specified  approval date range. Appeal ID: [id number] Ref#: [id number]

## 2022-11-23 NOTE — Telephone Encounter (Signed)
Test claim for Xolair process through. Called [facility] to set up Xolair fill.  They have med in stick and will set up shipment for Monday to get to patient by end of day Tuesday 11/27/2022. Patient has been advised of this.   We are still awaiting the approval letter.  Knox Saliva, PharmD, MPH, BCPS, CPP Clinical Pharmacist (Rheumatology and Pulmonology)

## 2022-11-26 NOTE — Telephone Encounter (Signed)
Received notification from [facility] regarding a prior authorization for XOLAIR. Authorization has been APPROVED from 10/29/2022 to 10/29/2023. Approval letter sent to scan center.  Authorization # / Appeal ID:  [id number]

## 2022-11-26 NOTE — Telephone Encounter (Signed)
Refill for Xolair successfully processed by [facility] per dispense history  Knox Saliva, PharmD, MPH, BCPS, CPP Clinical Pharmacist (Rheumatology and Pulmonology)

## 2022-11-29 ENCOUNTER — Other Ambulatory Visit (HOSPITAL_COMMUNITY): Payer: Self-pay

## 2022-12-03 ENCOUNTER — Other Ambulatory Visit (HOSPITAL_COMMUNITY): Payer: Self-pay

## 2022-12-04 ENCOUNTER — Ambulatory Visit: Payer: BC Managed Care – PPO | Admitting: Internal Medicine

## 2022-12-04 VITALS — BP 100/70 | HR 58 | Temp 98.0°F | Ht 68.0 in | Wt 115.0 lb

## 2022-12-04 DIAGNOSIS — J9611 Chronic respiratory failure with hypoxia: Secondary | ICD-10-CM

## 2022-12-04 DIAGNOSIS — J4550 Severe persistent asthma, uncomplicated: Secondary | ICD-10-CM | POA: Diagnosis not present

## 2022-12-04 DIAGNOSIS — B440 Invasive pulmonary aspergillosis: Secondary | ICD-10-CM | POA: Diagnosis not present

## 2022-12-04 DIAGNOSIS — A310 Pulmonary mycobacterial infection: Secondary | ICD-10-CM | POA: Diagnosis not present

## 2022-12-04 NOTE — Progress Notes (Signed)
male never smoker followed for ABPA , MAC and Aspergilloma .  Patient has a history of aspergilloma , status post VATS in 2012. 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 Followed at [facility] for [facility] .    TEST  1.7/13 Ig E 800s 12/10/11 Sputum AFB  Smear and culture negative (prelim) 12/10/11 Fungal sputum culture - negative (final) 01/30/2012 spirometry - fev1 1.7L/41%, ratip 52 - BEST EVER 01/30/2012 walking desaturation test: 185 feet x 3 laps: did not deaturate  05/2014 >Spiro fev1 1.7L/40%, ratio 55  Spirometry 06/25/2017 showed FEV1 at 28%, ratio 46, FVC 50%.  Events  2010 Dx with MAI >Azithro/Ethambutol 2012 Dx with Aspergilloma >VATS >started on Voriconazaol  2012 Dx w/ ABPA w/ High IgE >4000>started on prednisone  2013 tried off azithr/Etham but developed fever, Cx neg but restarted on rx.  2016 Hospitalzed with hemoptysis >IR guided embolization . FOB/BAL + Saccharomyces on fungal fx . Serum Galactomannan was neg.  [name] evaluation 04/2017 for Transplant -rejected. Felt to early for transplant - recommended pre-transplant weight goal 128 lbs for a BMI > 19          HPI   OV 01/16/2016  Chief Complaint  Patient presents with   Follow-up    Pt denies any increased dyspnea at this time - states that it comes and goes with activity. Pt states it is different on a daily basis. Denies hemoptysis since last OV.   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 Since then he overall feels that his dyspnea is not back to baseline. He feels he has had a new lower baseline. He has more fatigued than usual. But he is stable without any exacerbation. He feels in the next few years ago started to become permanently disabled. He currently works as a cBiomedical scientist There are no fevers.   Last chest x-ray 11/07/2015: Shows hyperinflation personally  visualized Last IgE January 2013 was 841 and significantly improved from 4000 2012.  Last CBC January 2017 with normal used to flows are 100 cells per cubic millimeter.   OV 08/27/2016  Chief Complaint  Patient presents with   Follow-up    Pt states his cough and SOB is at baseline. Pt states he has hemoptysis around once a week at most - penny size of mucus. Pt denies CP/tightness and f/c/s.      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 He status post IR guided embolization of hemoptysis end 2016.   Last visit feb 2017 he was reporting worsening dyspnea. We checked his IgE. Improved significantly but it was still in the 700s. We started Xolair therapy. He's been doing this now for a few months. He says this has helped his dyspnea symptoms significantly. However at end of 2 weeks he starts getting more symptomatic. Overall he is stable. 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 There are no fevers. Here, function test today FEV1 is 1.65 L/42% postbronchodilator. This is similar to 2014 but improved compared to 2015. Off note he tells me that he is mild hemoptysis once a month this is stable. He says this is actually been going on since November 2016 when he had his embolization. This no change in this. He knows that he has to monitor his hemoptysis  volume. He will have his flu shot today  Last chest x-ray 11/07/2015: Shows hyperinflation personally visualized Last IgE FEb 2017 was 717 and started xolair (January 2013 was 841 and significantly improved from 4000 in  2012)   OV 03/18/2017  Chief Complaint  Patient presents with   Follow-up    6 mo f/u. Breathing has been the same since the last visit. Denies any increased SOB or chest pains.    Follow-up severe persistent asthma with ABPA and aspergilloma with MAI infection. He status post IR guided embolization for hemoptysis and end of  2016   Overall he has done well. However he says that at his job at [employer] they have been working hard and he is mostly fatigue with very closely. Recently been only sleeping 3 hours per day because of the work. Then in mid April 2018 he have one episode of hemoptysis while at work. With small amount that lasted few to several minutes. It did scare him but he decided to hold off against going to the emergency department. Since then he's not had any recurrence. He feels stressed and job stress is contributing to this. Most recent CT scan chest as in December 2017 that shows stability documented below. There are no other new issues. He is open to having a transplant referral.   OV 01/27/2018  Chief Complaint  Patient presents with   Follow-up    Pt states he has been doing good. Denies any complaints or concerns.     Follow-up severe persistent asthma with ABPA on prednisone 30m daily, spiriva, breo and Aspergilloma with MAI infection. He status post IR guided embolization of hemoptysis end 2016 and on Xolair since early 2017   Overall doing well. 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 Symptoms score is low ; 12 CAT score. Compliant with Rx. No hemoptysis. Reviewed prior labs - he says on daily prednisone since 2012. And prior to that has had significant eosinophilia and also break through eos since then (see below). Unable to come off prednisone completely. XKathreen Devoidhas helped but not fully. 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 , MRN: [id number], ADDRESS: [address] [place] [id number]  12/17/2018 -   Chief Complaint  Patient presents with   Follow-up    Pt states he was in the hosp Oct and Nov 2019. Pt states he is still not back to his self but is taking  one day at a time. Due to the recent hospitalization is why pt is here for today's appt.     Follow-up severe persistent asthma with ABPA on prednisone 577mdaily, spiriva, breo and Aspergilloma with MAI infection. He status post IR guided embolization of hemoptysis end 2016 and on Xolair since early 2017 bu tstopped since spring 2019 due to insurance issues    HPI KeLIBRADO GUANDIQUE89.o. -returns for follow-up.  I last saw him in the spring 2019.  After that in September 2019 he saw my nurse practitioner.  Then mid October 2019 he was admitted with acute right-sided tension pneumothorax [personally visualized the CT chest and confirmed the findings today].  After that he had a 5-week hospital stay.  This included failed chest tube management and pleurodesis.  Finally he  required endobronchial valves by Dr. Roxan Hockey.  These were also finally removed a few weeks ago.  He saw Dr. Roxan Hockey yesterday.  Chest x-ray follow-up yesterday that I visualized the lungs look expanded.  He is also seen [name] of infectious diseases yesterday and is been asked to continue his MAC and ABPA antifungal therapy.  I did notice that he is not taking Xolair anymore since the spring 2019.  He says is due to insurance issues.  Today he tells me that overall with all these admissions he has been left completely fatigued, lost weight and worn out.  He is now worried about his life expectancy.  At the same time he wants to go on work.  He wants to know if he should apply for disability.  We have had these conversations in the past.  We discussed his life philosophy.  He tells me that if he did not work he would feel that he is lazy and it is not compatible with this personal value system.  I did indicate to him that this is to be completely respected but his medical condition is that he would qualify for disability.  Therefore it is a personal choice he will have to make.  He is applied for Social Security disability through  the hospital social worker in November 2019.  The decision is pending any time now.  He is frustrated that is not able to work 80 hours a day.  He is wondering if he can even work 40 hours a day.  I have only advised him that he should look at complete disability.  If at all he wants to work he can look at a desk job.  Definitely should avoid jobs that heavy workload in the kitchen that involve carrying things or even sneezing or even doing pulmonary function test anymore because given his propensity for pneumothorax any rise in intrathoracic pressure would put him at risk for pneumothorax or other complications.  Other than this in terms of his lung health he is feeling stable at this point.  He is not taking Xolair he stopped this in the spring 2019.  This means is continued IgE destruction of the lungs.  We discussed this and he is willing to look into restarting this  Of note, he also wants me to check his blood work for [name].  I reviewed Dr. Arlyss Queen note.  This is indeed correct because yesterday pick medical record system was down.    OV 10/28/2019  Subjective:  Patient ID: TOMAS SCHAMP, male , DOB: Oct 22, 1976 , age 76 y.o. , MRN: [id number] , ADDRESS: [address] [place] [address]   10/28/2019 -   Chief Complaint  Patient presents with   Follow-up    Pt states he has been doing okay since last visit. States he has good and bad days with his breathing and also has an occ cough.      Follow-up severe persistent asthma with ABPA on prednisone 70m daily, spiriva, breo and Aspergilloma with MAI infection. He status post IR guided embolization of hemoptysis end 2016 and on Xolair since early 2017 bu tstopped since spring 2019 due to insurance issues. FASENRA since April/May 2020  HPI KHUDSON MAJKOWSKI89y.o. -routine follow-up last seen in January 2020.  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  He will not lift more than 15 pounds because of his pneumothorax risk and dyspnea.  He is happy now that he has employed based insurance.  He did apply for disability but did not qualify because he did not desaturate with a 6-minute walk test.  This is despite him having severe obstructive lung disease.  He says he is reconciled and he likes to work because it gives him purpose.  He has had his flu shot.  He is now taking [name] since spring 2020 and this seems to be working well for him.  He takes it every 8 weeks.  He has not had any exacerbations.  He prefers to avoid pulmonary function test because of the pneumothorax risk.  There are no other new issues.     CAT Symptom & Quality of Life Score (GSK trademark) 0 is no burden. 5 is highest burden 01/27/2018   Never Cough -> Cough all the time 3  No phlegm in chest -> Chest is full of phlegm 2  No chest tightness -> Chest feels very tight 1  No dyspnea for 1 flight stairs/hill -> Very dyspneic for 1 flight of stairs 3  No limitations for ADL at home -> Very limited with ADL at home 1  Confident leaving home -> Not at all confident leaving home 0  Sleep soundly -> Do not sleep soundly because of lung condition 1  Lots of Energy -> No energy at all 1  TOTAL Score (max 40)  12    Results for DMARION, PERFECT (MRN [id number]) as of 01/27/2018 09:12  Ref. Range 08/06/2016 09:09 06/25/2017 09:11  FEV1-Pre Latest Units: L 1.50 1.16  FEV1-%Pred-Pre Latest Units: % 39 28  Pre FEV1/FVC ratio Latest Units: % 53 46     Results for LODEN, LAURENT (MRN [id number]) as of 01/27/2018 09:12  Ref. Range 12/16/2010 18:25 12/17/2010 03:45 12/17/2010 12:38 12/18/2010 04:25 12/19/2010 04:00 12/20/2010 04:04 12/21/2010 03:37 12/22/2010 06:42 12/22/2010 15:22 12/23/2010 04:22 12/24/2010 03:30 12/25/2010 04:19 12/27/2010 04:00 03/05/2011 18:15 03/06/2011 09:08 03/07/2011 09:18 03/09/2011 05:10 03/11/2011  04:54 04/02/2011 09:41 05/28/2011 09:42 11/26/2011 17:14 12/10/2011 10:21 12/10/2011 10:21 03/13/2012 16:54 09/22/2012 10:46 11/23/2013 09:46 07/05/2014 09:58 02/16/2015 10:16 08/08/2015 10:04 10/13/2015 04:00 10/13/2015 04:20 10/14/2015 02:28 10/15/2015 04:44 12/05/2015 12:16 01/16/2016 16:16 04/09/2016 09:43 08/09/2017 10:48 08/10/2017 10:46  Eosinophils Absolute Latest Ref Range: 0.0 - 0.7 K/uL 0.3 0.1  0.4          0.0 0.0 0.1   0.1 0.0 0.0    0.1 0.1 0.1 0.1 0.0 0.1   0.1 0.1 0.0 196 0.0 0.0   Results for BUCKLEY, BRADLY (MRN [id number]) as of 01/27/2018 09:12  Ref. Range 03/07/2011 09:18 03/08/2011 16:23 11/26/2011 17:14 01/16/2016 16:16 10/28/2019   IgE (Immunoglobulin E), Serum Latest Ref Range: <115 kU/L 4185.2 Result con... (H) 4744.6.Marland Kitchen. (H) 841.8 (H) 717 (H) 537      OV 06/09/2020  Subjective:  Patient ID: THERRON SELLS, male , DOB: [birth year] , age 64 y.o. , MRN: [id number] , ADDRESS: [address] [place] [address]   06/09/2020 -   Chief Complaint  Patient presents with   Hospitalization Follow-up    recent PNA- feeling weak today and low energy. He is not coughing more than usual baseline at this time- last hemoptysis 06/07/20.      HPI JAMOND NEELS 47 y.o. -returns for follow-up.  He was admitted  June 04, 2020 with hemoptysis over several weeks.  This is CT diagnosis of left lower lobe pneumonia according to the discharge notes.  He was discharged 1 day prior to this visit on June 15, 2020.  The CT report as below.  Currently he feels free of hemoptysis but just feels fatigued.  He still continues to work.  He is not interested in disability.  He is taking Spiriva and Breo.  He is taken a few days of work because of the post admission fatigue.  Of note his CT chest shows evidence of right heart strain.  He did not have an echocardiogram yet   IMPRESSION: 1. 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 Additional filling defects  present in the left upper lobe segmental and subsegmental branches as well. These are both within regions of extensive bullous disease, architectural distortion and surgical change and the peripherally marginated appearance is most suggestive of chronic embolus/occlusion. 2. While the RV/LV ratio is increased (1.15) the intraventricular septum remains appropriately convex and there is associated right atrial enlargement with reflux of contrast which could suggest more longstanding right heart enlargement and chronic pulmonary artery hypertension rather than acute right heart strain though if there is clinical concern, echocardiography could be performed for more definitive assessment. 3. Extensive chronic bullous paraseptal predominant emphysematous changes. Scattered areas of thick walled cystic bulla towards the apices with some peripheral nodularity and layering air-fluid level within the left apical bulla which could reflect a persisting fungal or atypical superinfection. Certainly could present with hemoptysis. 4. Further mixed areas of ground-glass and consolidative opacity in the left lower and right lower lobe periphery, also likely infectious or inflammatory with the differential including potential atypical viral infection such as COVID-19. 5. Cholelithiasis. 6. Moderate bilateral gynecomastia. 7. Paucity of subcutaneous fat.  Correlate with nutritional status. 8. Aortic Atherosclerosis (ICD10-I70.0). 9. Emphysema (ICD10-J43.9).   These results were called by telephone at the time of interpretation on 06/04/2020 at 10:50 pm to provider DAVID YAO , who verbally acknowledged these results.     Electronically Signed   By: Lovena Le M.D.   On: 06/04/2020 22:50  ROS - per HPI     OV 11/02/2020  Subjective:  Patient ID: Katheren Puller, male , DOB: 03-Nov-1976 , age 77 y.o. , MRN: [id number] , ADDRESS: [place] [address] PCP Robert Bellow, PA-C Patient Care Team: Rip Harbour as PCP - General (Internal Medicine) Tommy Medal, Lavell Islam, MD as Consulting Physician (Infectious Diseases) System, Provider Not In as Consulting Physician Brand Males, MD as Consulting Physician (Pulmonary Disease)  This Provider for this visit: Treatment Team:  Attending Provider: Brand Males, MD     Follow-up - - severe persistent asthma with ABPA and highly elevated IgE levels [4000 and 2012] on prednisone 77m daily, BREZTRI  - >  he  and on Xolair since early 2017 bu tstopped since spring 2019 due to insurance issues. FASENRA since April/May 2020 -  Aspergilloma   - Rx voriconazole -> developed skin lesions and darkness in the forearms ->Rx Cresemba via Dr VTommy Medal  -  status post IR guided embolization of hemoptysis end 2016  -  MAI infection. 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 More tired than normal, shortness of breath is worse. Productive cough with yellow/green sputum since October     HPI JONERIC STREIGHT 47 y.o. -returns for follow-up.. Last seen in July 2021. After that he is seen nurse practitioner because of exacerbation symptoms. The check sputum culture couple of times I reviewed the records but this is all no growth. He says that he now has increased sputum production than baseline. It is yellow-green in color. He is frustrated that the cultures have not grown anything. In addition is reporting significant fatigue. His last blood IgE was in 2020 when it improved from 4000 several years ago to 500. He had recent chest x-ray in the last couple of months both of them was stable with chronic changes. He feels  very miserable because of his fatigue and respiratory symptoms. He says not able to sleep well in the night. He says the quality of life is miserable  He is up-to-date with his vaccination   OV 12/30/2020  Subjective:  Patient ID: HALL BIRCHARD, male , DOB: 1976/05/30 , age 81 y.o. , MRN: [id number] , ADDRESS: [address] [place] [address] PCP Robert Bellow, PA-C Patient Care Team: Rip Harbour as PCP - General (Internal Medicine) Tommy Medal, Lavell Islam, MD as Consulting Physician (Infectious Diseases) System, Provider Not In as Consulting Physician Brand Males, MD as Consulting Physician (Pulmonary Disease)  This Provider for this visit: Treatment Team:  Attending Provider: Melvenia Needles, NP    12/30/2020 -   Chief Complaint  Patient presents with   Follow-up    Breathing is not doing well     HPI Katheren Puller 47 y.o. -presents for visit.  He tells me in the last 3 weeks he has had a decline in shortness of breath increased cough with yellow sputum production.  He says sputum has been repeatedly tested and culture negative.  He is also reporting desaturations at home when he exerts himself moving boxes at work or when he climbs a flight of stairs.  He feels he wants oxygen.  He does started Xolair a few weeks ago and he feels it has not kicked in as yet.  Today we walked him up a flight of stairs and he desaturated to 85% got extremely short of breath.  He corrected with 2 L nasal cannula. I personally walked him up   CT Chest data  No results found.     OV 05/10/2021  Subjective:  Patient ID: XANDER JUTRAS, male , DOB: [birth year] , age 3 y.o. , MRN: [id number] , AD[address]ESS: [address] [place] [phone] PCP Robert Bellow, PA-C Patient Care Team: Rip Harbour as PCP - General (Internal Medicine) Tommy Medal, Lavell Islam, MD as Consulting Physician (Infectious Diseases) System, Provider Not In as Consulting  Physician Brand Males, MD as Consulting Physician (Pulmonary Disease)  This Provider for this visit: Treatment Team:  Attending Provider: Brand Males, MD    05/10/2021 -   Chief Complaint  Patient presents with   Follow-up    Pt states he has been doing okay since last visit. States that his breathing has been a little worse due to the hot weather.   HPI CHANANYA CANIZALEZ 47 y.o. -returns for follow-up.  Since starting Xolair earlier in the  year and oxygen at night and with exertion he is feeling better.  His respiratory symptoms are not as bad and he only had 1 exacerbation.  He says he has been using the vibratory vest in his chest but this is hurting him.  This 1 report of osteoporosis.  Moreover it is expensive.  He wants to return this.  He says the amount of sputum he brings is very little.  He is wondering if there is another alternative.  He followed up with infectious diseases.  We talked about going back to [facility] for another opinion but he said it was too expensive does not want to go.    CT Chest data  No results found.     OV 12/04/2022  Subjective:  Patient ID: Katheren Puller, male , DOB: Dec 13, 1975 , age 65 y.o. , MRN: [id number] , ADDRESS: [address] [place] [address] PCP Robert Bellow, PA-C Patient Care Team: Rip Harbour as PCP - General (Internal Medicine) Tommy Medal, Lavell Islam, MD as Consulting Physician (Infectious Diseases) System, Provider Not In as Consulting Physician Brand Males, MD as Consulting Physician (Pulmonary Disease)  This Provider for this visit: Treatment Team:  Attending Provider: Brand Males, MD   Follow-up - - severe persistent asthma with ABPA and highly elevated IgE levels [4000 and 2012]   -> on prednisone 41m daily -. 140mdaily, BREZTRI  - >  He was on Xolair since early 2017 bu tstopped since spring 2019 due to insurance issues. -> resarted Ja 2022 due to worsening IgE and  symptoms /PFT > restart January/February 2022  -> FASENRA since April/May 2020   - > started VVEST Jan 20202  -> night o2 and ex o2 - spring 2022 -only partially compliant as of January 2024.  -  Aspergilloma   - Rx voriconazole -> developed skin lesions and darkness in the forearms ->Rx Cresemba via Dr VaLucianne Leiam.-> skin beter  -  status post IR guided embolization of hemoptysis end 2016  - life long  -  MAI infection. 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   12/04/2022 -   Chief Complaint  Patient presents with   Follow-up    F/up     HPI KeTRAEGER SULTANA89.o. -returns for follow-up.  I personally not seen him in a year and a half.  He has been seeing nurse practitioners.  Is kept up with Dr. VaDrucilla Schmidt He continues his breztri, FaBerna Buend Xolair.  He continues the MAC treatment with Dr. VaDrucilla Schmidt He also continues Cresemba.  He is now back working.  He is not using the vest.  He is now back working.  He is not using the vest.  He does use 3% saline.  He continues daily prednisone.  He is not fully compliant with his oxygen.  He uses it as needed for his cough.  I told him to increase his compliance.  Otherwise no new issues.  Current symptoms status listed below.   SYMPTOM SCALE  12/04/2022  Current weight   O2 use prn  Shortness of Breath 0 -> 5 scale with 5 being worst (score 6 If unable to do)  At rest 1  Simple  tasks - showers, clothes change, eating, shaving 2  Household (dishes, doing bed, laundry) 3  Shopping 3  Walking level at own pace 2  Walking up Stairs 5  Total (30-36) Dyspnea Score 16      Non-dyspnea symptoms (0-> 5 scale) 12/04/2022  How bad is your cough? 4  How bad is your fatigue 3  How  bad is nausea 1  How bad is vomiting?  2  How bad is diarrhea? 1  How bad is anxiety? 2  How bad is depression 4  Any chronic pain - if so where and how bad x       CT Chest data June 2023   IMPRESSION: 1. Chronic severe pulmonary findings with extensive cavitation particularly in the upper lobes, emphysema with extensive bullous disease, scattered scarring, and scattered infectious nodularity and airway plugging. Compared to the April 20th exam, there are some evolutionary findings including reduced debris/increase cavitation of the dominant right posterior apical nodule which is continuous with a superior segment right lower lobe lesion which likewise demonstrates progressive cavitation. There is some increased complexity in fluid in the anterior left apical bulla likely from atypical infection, and the large posterior apical bulla appears to have inferior components which are directly connected to different bronchi of the left lower lobe. 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 2. Aortic Atherosclerosis (ICD10-I70.0) and Emphysema (ICD10-J43.9).     Electronically Signed   By: Van Clines M.D.   On: 05/03/2022 17:01    No results found.    PFT     Latest Ref Rng & Units 11/30/2020   11:13 AM 06/25/2017    9:11 AM 08/06/2016    9:09 AM  PFT Results  FVC-Pre L 2.09  2.52  2.84   FVC-Predicted Pre % 47  50  61   FVC-Post L 2.16   2.93   FVC-Predicted Post % 49   63   Pre FEV1/FVC % % 56  46  53   Post FEV1/FCV % % 56   56   FEV1-Pre L 1.17  1.16  1.50   FEV1-Predicted Pre % 33  28  39   FEV1-Post L 1.22   1.63   DLCO uncorrected ml/min/mmHg 15.81   18.56   DLCO UNC% % 53   59   DLCO corrected ml/min/mmHg 16.67   18.35   DLCO COR %Predicted % 56   59   DLVA Predicted % 123   108   TLC L   5.14   TLC % Predicted %   76   RV % Predicted %    129        has a past medical history of Anemia, 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[place]), 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   reports that he quit smoking about 15 years ago. His smoking use included cigarettes. He has a 13.30 pack-year smoking history. He quit smokeless tobacco use about 28 years ago.  His smokeless tobacco use included snuff.  Past Surgical History:  Procedure Laterality Date   BRONCHIAL WASHINGS  03/10/2022   Procedure: BRONCHIAL WASHINGS;  Surgeon: Ander Slade,  Adewale A, MD;  Location: [facility];  Service: Pulmonary;;   CHEST TUBE INSERTION Right 09/29/2018   Procedure: CHEST TUBE REPLACEMENT;  Surgeon: Melrose Nakayama, MD;  Location: [place];  Service: Thoracic;  Laterality: Right;   INSERTION OF IBV VALVE N/A 09/19/2018   Procedure: INSERTION OF INTERBRONCHIAL VALVE (IBV);  Surgeon: Melrose Nakayama, MD;  Location: [facility] OR;  Service: Thoracic;  Laterality: N/A;   INSERTION OF IBV VALVE N/A 12/01/2018   Procedure: REMOVAL OF INTERBRONCHIAL VALVE (IBV);  Surgeon: Melrose Nakayama, MD;  Location: [facility] OR;  Service: Thoracic;  Laterality: N/A;   left VATS  2012   thoracotomy and LUL apical posterior segmentectomy   STAPLING OF BLEBS Right 09/12/2018   Procedure: STAPLING OF BLEBS, right lower and middle lung lobes;  Surgeon: Melrose Nakayama, MD;  Location: [place];  Service: Thoracic;  Laterality: Right;   VIDEO ASSISTED THORACOSCOPY Right 09/12/2018    Procedure: VIDEO ASSISTED THORACOSCOPY, right lung;  Surgeon: Melrose Nakayama, MD;  Location: [place];  Service: Thoracic;  Laterality: Right;   VIDEO BRONCHOSCOPY Bilateral 10/20/2015   Procedure: VIDEO BRONCHOSCOPY WITHOUT FLUORO;  Surgeon: Marshell Garfinkel, MD;  Location: [place];  Service: Cardiopulmonary;  Laterality: Bilateral;   VIDEO BRONCHOSCOPY N/A 09/19/2018   Procedure: VIDEO BRONCHOSCOPY;  Surgeon: Melrose Nakayama, MD;  Location: [place];  Service: Thoracic;  Laterality: N/A;   VIDEO BRONCHOSCOPY N/A 12/01/2018   Procedure: VIDEO BRONCHOSCOPY;  Surgeon: Melrose Nakayama, MD;  Location: [place];  Service: Thoracic;  Laterality: N/A;   VIDEO BRONCHOSCOPY N/A 03/10/2022   Procedure: VIDEO BRONCHOSCOPY WITHOUT FLUORO;  Surgeon: Laurin Coder, MD;  Location: [facility];  Service: Pulmonary;  Laterality: N/A;   VIDEO BRONCHOSCOPY WITH INSERTION OF INTERBRONCHIAL VALVE (IBV) N/A 09/29/2018   Procedure: VIDEO BRONCHOSCOPY WITH INSERTION OF INTERBRONCHIAL VALVE  (IBV);  Surgeon: Melrose Nakayama, MD;  Location[facility];  Service: Thoracic;  Laterality: N/A;    Allergies  Allergen Reactions   Clarithromycin Other (See Comments)    Adverse reaction w/ voriconazole    Rifaximin Other (See Comments)    Adverse reaction w/ voriconazole     Immunization History  Administered Date(s) Administered   COVID-19, mRNA, vaccine(Comirnaty)12 years and older 10/02/2022   DTaP 11/19/2010   Influenza Split 10/29/2011, 08/14/2012   Influenza Whole 09/19/2010   Influenza,inj,Quad PF,6+ Mos 11/23/2013, 09/28/2014, 09/06/2015, 08/27/2016, 08/10/2017, 09/09/2018, 08/06/2019, 08/10/2020, 09/27/2021, 10/02/2022   Influenza-Unspecified 10/29/2011, 08/14/2012, 11/23/2013, 09/28/2014, 08/27/2016, 08/10/2017, 09/09/2018, 08/06/2019, 08/10/2020   PFIZER Comirnaty(Gray Top)Covid-19 Tri-Sucrose Vaccine 12/28/2020   PFIZER(Purple Top)SARS-COV-2 Vaccination 02/18/2020, 03/14/2020, 08/10/2020    PNEUMOCOCCAL CONJUGATE-20 07/04/2022   Pfizer Covid-19 Vaccine Bivalent Booster 8yr & up 10/04/2021, 04/04/2022   Pneumococcal Conjugate-13 04/29/2017, 04/29/2017   Pneumococcal Polysaccharide-23 12/17/2010   Tdap 09/27/2021    Family History  Adopted: Yes     Current Outpatient Medications:    albuterol (PROVENTIL) (2.5 MG/3ML) 0.083% nebulizer solution, Take 3 mLs (2.5 mg total) by nebulization every 4 (four) hours as needed for wheezing or shortness of breath., Disp: 75 mL, Rfl: 12   albuterol (VENTOLIN HFA) 108 (90 Base) MCG/ACT inhaler, INHALE 2 PUFFS INTO THE LUNGS EVERY 6 HOURS AS NEEDED, Disp: 18 g, Rfl: 2   azithromycin (ZITHROMAX) 500 MG tablet, Take 1 tablet (500 mg total) by mouth daily., Disp: 30 tablet, Rfl: 11   Budeson-Glycopyrrol-Formoterol (BREZTRI AEROSPHERE) 160-9-4.8 MCG/ACT AERO, INHALE 2 PUFFS BY MOUTH TWICE DAILY, Disp: 10.7 g, Rfl: 1   OXYGEN, Inhale  3 L into the lungs as needed (shortness of breath)., Disp: , Rfl:    predniSONE (DELTASONE) 10 MG tablet, Take 1 tablet (10 mg total) by mouth daily with breakfast., Disp: 90 tablet, Rfl: 3   Benralizumab (FASENRA PEN) 30 MG/ML SOAJ, Inject 1 mL (30 mg total) into the skin every 8 (eight) weeks., Disp: 1 mL, Rfl: 1   CALCIUM CARBONATE-VITAMIN D PO, Take 2 tablets by mouth every morning., Disp: , Rfl:    chlorpheniramine-HYDROcodone 10-8 MG/5ML, Take 5 mLs by mouth every 12 (twelve) hours as needed for cough., Disp: 115 mL, Rfl: 0   doxycycline (VIBRA-TABS) 100 MG tablet, Take 1 tablet (100 mg total) by mouth 2 (two) times daily., Disp: 14 tablet, Rfl: 0   EPINEPHrine 0.3 mg/0.3 mL IJ SOAJ injection, ADMINISTER 0.3 MG IN THE MUSCLE 1 TIME FOR 1 DOSE, Disp: 2 each, Rfl: 3   ethambutol (MYAMBUTOL) 400 MG tablet, Take 2.5 tablets (1,000 mg total) by mouth daily., Disp: 225 tablet, Rfl: 3   feeding supplement, ENSURE ENLIVE, (ENSURE ENLIVE) LIQD, Take 237 mLs by mouth 3 (three) times daily between meals. (Patient taking  differently: Take 237 mLs by mouth 2 (two) times daily as needed (meal supplement).), Disp: 237 mL, Rfl: 12   ferrous sulfate 325 (65 FE) MG tablet, Take 325 mg by mouth every morning., Disp: , Rfl:    folic acid (FOLVITE) 1 MG tablet, Take 1 mg by mouth every other day., Disp: , Rfl:    Isavuconazonium Sulfate (CRESEMBA) 186 MG CAPS, TAKE 2 CAPSULES BY MOUTH DAILY, Disp: 60 capsule, Rfl: 11   Menthol-Methyl Salicylate (SALONPAS PAIN RELIEF PATCH EX), Place 1-2 patches onto the skin daily. Back pain, Disp: , Rfl:    mirtazapine (REMERON SOL-TAB) 45 MG disintegrating tablet, Take 45 mg by mouth at bedtime., Disp: , Rfl:    omalizumab (XOLAIR) 150 MG/ML prefilled syringe, INJECT 300 MG INTO THE SKIN EVERY 14 (FOURTEEN) DAYS. IN ADDITION TO 75MG (TOTAL DOSE IS 375MG EVERY 14 DAYS)., Disp: 4 mL, Rfl: 5   omalizumab (XOLAIR) 75 MG/0.5ML prefilled syringe, INJECT 75 MG INTO THE SKIN EVERY 14 (FOURTEEN) DAYS. IN ADDITION TO 300MG (TOTAL DOSE IS 375MG EVERY 14 DAYS). DELIVER TO PATIENT'S HOME., Disp: 1 mL, Rfl: 5   sodium chloride HYPERTONIC 3 % nebulizer solution, USE NEBULIZEER DAILY AS DIRECTED, Disp: 750 mL, Rfl: 5   TIROSINT-SOL 88 MCG/ML SOLN, Take by mouth., Disp: , Rfl:       Objective:   Vitals:   12/04/22 1513  BP: 100/70  Pulse: (!) 58  Temp: 98 F (36.7 C)  SpO2: 92%  Weight: 115 lb (52.2 kg)  Height: _0  (1.727 m)    Estimated body mass index is 17.49 kg/m as calculated from the following:   Height as of this encounter: _1  (1.727 m).   Weight as of this encounter: 115 lb (52.2 kg).  _2 @  Filed Weights   12/04/22 1513  Weight: 115 lb (52.2 kg)     Physical Exam General: No distress. Lean. Looks better than 2 year sago Neuro: Alert and Oriented x 3. GCS 15. Speech normal Psych: Pleasant Resp:  Barrel Chest - no.  Wheeze - no, Crackles - no, No overt respiratory distress CVS: Normal heart sounds. Murmurs - no Ext: Stigmata of Connective Tissue Disease -  bi HEENT: Normal upper airway. PEERL +. No post nasal drip        Assessment:       ICD-10-CM  1. Severe persistent asthma, unspecified whether complicated  P36.68     2. 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 Chronic respiratory failure with hypoxia (HCC)  J96.11          Plan:     Patient Instructions     ICD-10-CM   1. 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 Bronchiectasis without complication ([place])  D59.4     3. 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[place])  B44.9     4. 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 Elevated IgE level  R76.8     6. Severe persistent asthma, unspecified whether complicated  H18.34       Stable and overall better since o2 , fasenra and add [employer] like vest is expensive and not beneficial Current oxygen use is as needed  Plan  -cotninue 17m 3% saline neb once daily to help mucus clearance  - continue o2 at night and exertion; try to increase the usage  = continue breztri, xolair and fasenra and daily prednisone - continue ABPA/MAC Rx with Dr VDrucilla Schmidt Followup  - 4-6 months or sooner if needed; CAT score at followup  Moderate Complexity  The table below is from the 2021 E/M guidelines, first released in 2021, with minor revisions added in 2023. Must meet the requirements for 2 out of 3 dimensions to qualify.    Number and complexity of problems addressed Amount and/or complexity of data reviewed Risk of complications and/or morbidity  One or more chronic illness with mild exacerbation, progression, or side effects of treatment  Two or more stable chronic illnesses  One undiagnosed new problem with uncertain prognosis  One acute illness with systemic symptoms   Acute complicated injury Must meet the requirements for 1 of 3 of the categories)  Category 1: Tests and documents, historian  Any combination of 3 of the following:  Assessment  requiring an independent historian  Review of prior external records  Review of results of each unique test  Ordering of each unique test    Category 2: Interpretation of tests  Independent interpretation of a test perfromed by another physician/NPP  Category 3: Discuss management/tests  Discussion of magagement or tests with an external physician/NPP Prescription drug management  Decision regarding minor surgery with identfied patient or procedure risk factors  Decision regarding elective major surgery without identified patient or procedure risk factors  Diagnosis or treatment significantly limited by social determinants of health              SIGNATURE    Dr. MBrand Males M.D., F.C.C.P,  Pulmonary and Critical Care Medicine Staff Physician, CEvansDirector - Interstitial Lung Disease  Program  Pulmonary [place] [id number] Pager: [phone] If no answer or between  15:00h - 7:00h: call [phone] Telephone: [phone]  3:40 PM 12/04/2022

## 2022-12-04 NOTE — Patient Instructions (Signed)
ICD-10-CM   1. 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 Bronchiectasis without complication ([place])  D92.4     3. 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[place])  B44.9     4. Eosinophilic asthma  Q68.34     5. Elevated IgE level  R76.8     6. Severe persistent asthma, unspecified whether complicated  H96.22       Stable and overall better since o2 , fasenra and add [employer] like vest is expensive and not beneficial Current oxygen use is as needed  Plan  -cotninue 72m 3% saline neb once daily to help mucus clearance  - continue o2 at night and exertion; try to increase the usage  = continue breztri, xolair and fasenra and daily prednisone - continue ABPA/MAC Rx with Dr VDrucilla Schmidt Followup  - 4-6 months or sooner if needed; CAT score at followup

## 2022-12-06 DIAGNOSIS — J9611 Chronic respiratory failure with hypoxia: Secondary | ICD-10-CM | POA: Diagnosis not present

## 2022-12-10 ENCOUNTER — Other Ambulatory Visit (HOSPITAL_COMMUNITY): Payer: Self-pay

## 2022-12-11 ENCOUNTER — Other Ambulatory Visit (HOSPITAL_COMMUNITY): Payer: Self-pay

## 2022-12-12 ENCOUNTER — Other Ambulatory Visit (HOSPITAL_COMMUNITY): Payer: Self-pay

## 2022-12-13 ENCOUNTER — Other Ambulatory Visit (HOSPITAL_COMMUNITY): Payer: Self-pay

## 2022-12-17 ENCOUNTER — Other Ambulatory Visit (HOSPITAL_COMMUNITY): Payer: Self-pay

## 2022-12-19 ENCOUNTER — Other Ambulatory Visit: Payer: Self-pay | Admitting: Internal Medicine

## 2022-12-19 ENCOUNTER — Other Ambulatory Visit (HOSPITAL_COMMUNITY): Payer: Self-pay

## 2022-12-19 DIAGNOSIS — J4550 Severe persistent asthma, uncomplicated: Secondary | ICD-10-CM

## 2022-12-19 MED ORDER — OMALIZUMAB 75 MG/0.5ML ~~LOC~~ SOSY
PREFILLED_SYRINGE | SUBCUTANEOUS | 5 refills | Status: DC
  Filled 2022-12-19: qty 1, 28d supply, fill #0
  Filled 2023-01-15: qty 1, 28d supply, fill #1
  Filled 2023-02-12: qty 1, 28d supply, fill #2
  Filled 2023-03-11: qty 1, 28d supply, fill #3
  Filled 2023-04-09: qty 1, 28d supply, fill #4
  Filled 2023-05-08: qty 1, 28d supply, fill #5

## 2022-12-19 MED ORDER — OMALIZUMAB 150 MG/ML ~~LOC~~ SOSY
PREFILLED_SYRINGE | SUBCUTANEOUS | 5 refills | Status: DC
  Filled 2022-12-19: qty 4, 28d supply, fill #0
  Filled 2023-01-15: qty 4, 28d supply, fill #1
  Filled 2023-02-12: qty 4, 28d supply, fill #2
  Filled 2023-03-11: qty 4, 28d supply, fill #3
  Filled 2023-04-09: qty 4, 28d supply, fill #4
  Filled 2023-05-08: qty 4, 28d supply, fill #5

## 2022-12-20 ENCOUNTER — Other Ambulatory Visit (HOSPITAL_COMMUNITY): Payer: Self-pay

## 2022-12-21 ENCOUNTER — Other Ambulatory Visit (HOSPITAL_COMMUNITY): Payer: Self-pay

## 2022-12-24 ENCOUNTER — Other Ambulatory Visit (HOSPITAL_COMMUNITY): Payer: Self-pay

## 2022-12-24 ENCOUNTER — Other Ambulatory Visit: Payer: Self-pay

## 2022-12-26 ENCOUNTER — Other Ambulatory Visit (HOSPITAL_COMMUNITY): Payer: Self-pay

## 2023-01-01 ENCOUNTER — Other Ambulatory Visit: Payer: Self-pay | Admitting: Internal Medicine

## 2023-01-04 ENCOUNTER — Telehealth: Payer: Self-pay | Admitting: Internal Medicine

## 2023-01-04 NOTE — Telephone Encounter (Signed)
ATC patient. LVMTCB. 

## 2023-01-06 DIAGNOSIS — J9611 Chronic respiratory failure with hypoxia: Secondary | ICD-10-CM | POA: Diagnosis not present

## 2023-01-08 ENCOUNTER — Other Ambulatory Visit: Payer: Self-pay | Admitting: Internal Medicine

## 2023-01-08 ENCOUNTER — Other Ambulatory Visit (HOSPITAL_COMMUNITY): Payer: Self-pay

## 2023-01-09 ENCOUNTER — Other Ambulatory Visit: Payer: Self-pay

## 2023-01-09 DIAGNOSIS — E039 Hypothyroidism, unspecified: Secondary | ICD-10-CM | POA: Diagnosis not present

## 2023-01-09 NOTE — Telephone Encounter (Signed)
Called and spoke with patient. Patient needed a prescription for albuterol sent in to the pharmacy. Rx has been sent to the pharmacy.   Nothing further needed.

## 2023-01-14 ENCOUNTER — Other Ambulatory Visit: Payer: Self-pay | Admitting: Internal Medicine

## 2023-01-15 ENCOUNTER — Other Ambulatory Visit (HOSPITAL_COMMUNITY): Payer: Self-pay

## 2023-01-16 ENCOUNTER — Ambulatory Visit: Payer: BC Managed Care – PPO | Admitting: Infectious Disease

## 2023-01-16 ENCOUNTER — Other Ambulatory Visit (HOSPITAL_COMMUNITY): Payer: Self-pay

## 2023-01-16 ENCOUNTER — Ambulatory Visit (INDEPENDENT_AMBULATORY_CARE_PROVIDER_SITE_OTHER): Payer: BC Managed Care – PPO

## 2023-01-16 ENCOUNTER — Other Ambulatory Visit: Payer: Self-pay

## 2023-01-16 ENCOUNTER — Ambulatory Visit: Payer: BC Managed Care – PPO | Admitting: Adult Health

## 2023-01-16 ENCOUNTER — Encounter: Payer: Self-pay | Admitting: Infectious Disease

## 2023-01-16 VITALS — BP 120/76 | HR 86

## 2023-01-16 VITALS — BP 106/78 | HR 89 | Temp 97.2°F | Ht 68.0 in | Wt 109.0 lb

## 2023-01-16 DIAGNOSIS — R6881 Early satiety: Secondary | ICD-10-CM | POA: Diagnosis not present

## 2023-01-16 DIAGNOSIS — B440 Invasive pulmonary aspergillosis: Secondary | ICD-10-CM | POA: Diagnosis not present

## 2023-01-16 DIAGNOSIS — B4481 Allergic bronchopulmonary aspergillosis: Secondary | ICD-10-CM

## 2023-01-16 DIAGNOSIS — J4551 Severe persistent asthma with (acute) exacerbation: Secondary | ICD-10-CM | POA: Diagnosis not present

## 2023-01-16 DIAGNOSIS — R5381 Other malaise: Secondary | ICD-10-CM

## 2023-01-16 DIAGNOSIS — B449 Aspergillosis, unspecified: Secondary | ICD-10-CM | POA: Diagnosis not present

## 2023-01-16 DIAGNOSIS — J15211 Pneumonia due to Methicillin susceptible Staphylococcus aureus: Secondary | ICD-10-CM | POA: Diagnosis not present

## 2023-01-16 DIAGNOSIS — J479 Bronchiectasis, uncomplicated: Secondary | ICD-10-CM | POA: Diagnosis not present

## 2023-01-16 DIAGNOSIS — J218 Acute bronchiolitis due to other specified organisms: Secondary | ICD-10-CM

## 2023-01-16 DIAGNOSIS — A310 Pulmonary mycobacterial infection: Secondary | ICD-10-CM

## 2023-01-16 DIAGNOSIS — J4489 Other specified chronic obstructive pulmonary disease: Secondary | ICD-10-CM

## 2023-01-16 DIAGNOSIS — B181 Chronic viral hepatitis B without delta-agent: Secondary | ICD-10-CM

## 2023-01-16 DIAGNOSIS — J439 Emphysema, unspecified: Secondary | ICD-10-CM | POA: Diagnosis not present

## 2023-01-16 DIAGNOSIS — E43 Unspecified severe protein-calorie malnutrition: Secondary | ICD-10-CM

## 2023-01-16 DIAGNOSIS — J9611 Chronic respiratory failure with hypoxia: Secondary | ICD-10-CM

## 2023-01-16 HISTORY — DX: Early satiety: R68.81

## 2023-01-16 MED ORDER — AMOXICILLIN-POT CLAVULANATE 875-125 MG PO TABS: 1.0000 | ORAL_TABLET | Freq: Two times a day (BID) | ORAL | 0 refills | Status: DC

## 2023-01-16 MED ORDER — PREDNISONE 10 MG PO TABS: 10.0000 mg | ORAL_TABLET | Freq: Every day | ORAL | 3 refills | Status: DC

## 2023-01-16 MED ORDER — HYDROCODONE BIT-HOMATROP MBR 5-1.5 MG/5ML PO SOLN: 5.0000 mL | Freq: Every evening | ORAL | 0 refills | Status: DC | PRN

## 2023-01-16 MED ORDER — PREDNISONE 10 MG PO TABS: ORAL_TABLET | ORAL | 0 refills | Status: DC

## 2023-01-16 NOTE — Assessment & Plan Note (Signed)
Continue with mucociliary clearance.  Encouraged to use his flutter valve at least 3 times a day along with his hypertonic nebs once or twice daily.

## 2023-01-16 NOTE — Assessment & Plan Note (Signed)
Patient is encouraged on a high-protein diet.

## 2023-01-16 NOTE — Assessment & Plan Note (Signed)
Will continue on current regimen.  Currently having a flare of asthma.  Prednisone taper and then resume back to daily dosing of prednisone 10 mg daily

## 2023-01-16 NOTE — Progress Notes (Signed)
Subjective:  Chief complaint malaise early satiety nausea and worsening cough Patient ID: [name], male    DOB: 17-Sep-1976, 47 y.o.   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 We had taken him off his Mycobacterium avium drugs and then he developed fevers chills and malaise  Spring (2013) and was seen by my partner Dr. Megan Farmer in late April  His AFB smears and cultures obtained in the clinic on that day prior to the initiation of azithromycin ethambutol ultimately failed to grow Mycobacterium avium. He had been doing well I saw him in [place] 2013 we discontinued his azithromycin and ethambutol again. Unfortunately he again shortly thereafter developed fevers cough malaise. He started back on azithromycin ethambutol. Cultures done for AFB here were again negative. The patient however felt dramatically better having been back on azithromycin and ethambutol.    I saw him in January of 2015 when he was feeling relatively well.   He did have an episode of coughing up a few flecks of blood in January 2016 which prompted appt with [facility] but by time he made appt this had long since resolved. Was only a one time episode none since then.   I saw again in March and then in September 2016. In summer he was hospitalized with hemoptysis and found to have an area in the upper lobe consistent with an aspergilloma. He underwent bronchoscopy with BAL and cultures ultimately yielded a Saccharomyces species on fungal culture. His serum galactomannan was negative.   He had been  on voriconazole and axial has had radiographic improvement and his x-ray findings. He also feelt etter symptomatically with no  more hemoptysis improvement in his cough improvement in his energy though he still feels not nearly as well as he did prior to his hospitalization. He is avoiding exercised during the winter as it often precipitates coughing spells. He was  only taking 200 mg twice a day of voriconazole rather than the 300 mg he has continued this along with his ethambutol and azithromycin. He continued to have cough more so with exercise and has not been exercising at "[employer]" due to the fact this elicits more coughing. He has not smoked since 2007. He is seeing Dr. Chase Farmer and is being considered for new drug infusion to treat his COPD.   He did have 20# weight loss that he attributed to increased exercise.   He then saw  Dr. Chase Farmer after an episode of hematemesis. 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 He said he only coughed up a small amount of blood material and did not go to the ER but was seen and followed by pulmonary. Had a chest x-ray which showed no new findings.   He was then  going to be evaluated by the transplant team at [facility].   He did complain of hyperpigmented spots on sun exposed areas on his body including arms and neck which his dermatologist have told him is due to his voriconazole use.    We considered change at that time   Since that time he has [place] been admitted with hemoptysis at [facility]    Repeat CT lungs showed on 08/09/17:   Large  emphysematous bulla are noted in both upper lobes. There is interval development of fluid density in several bulla in the left upper lobe concerning for superimposed infection, possibly fungal in origin.   Stable bilateral calcified pulmonary nodules are noted consistent with prior granulomatous disease.   Stable tree in bud appearance is noted medially in the left lung posteriorly consistent with sequela of previous atypical infection.     Due to his skin toxicity we decided to change him to  [facility] and he has been on that for some time now.  He had problems with bilateral pneumothoraces.  This required placement of an intrabronchial valve in November 2019.  This resolved but then he had to be taken back to the operating room in early January where the intrabronchial valves were removed.   Was seen at [facility] medical transplant team as well but felt not yet to be a transplant candidate due to the fact that his lungs were not in severe enough condition to warrant transplantation   He had applied for disability but this has not yet gone through.  Certainly he deserves to have disability and he has significant medical problems.  He did however eventually go back to work at [employer]   He is been receiving his antimycobacterial drugs and is [place].   I last saw cans he was hospitalized in July 2021  for what he says was pneumonia.  The CT scan was read as showing:   " Chronic narrowing and occlusion of several right upper lobe segmental subsegmental pulmonary arteries and filling defects in the left upper lobe segmental and subsegmental branches which were within the regions of bullous disease which are suggestive of chronic embolism/occlusion.   It showed extensive bullous and paraseptal predominant emphysematous changes with thick-walled cystic bullae toward the apices with nodularity and air layering air-fluid levels within the left apical bulla consistent with aspergilloma.   He had mixed areas of groundglass and consolidative opacity left lower lobe and right lower lobe periphery.   Nares note indicates that he was not anticoagulated due to the fact that he has recurrent hemoptysis per the patient himself was unaware that he was found to have anything suggestive clots on scan and believes he was only treated for pneumonia.   Since I last saw [name] he was treated with Augmentin and corticosteroids for flare of bronchitis.   He improved on both steroids and antibiotics  but within 3 to 5 days after coming off the Augmentin had worsening cough with productive sputum particularly in the morning some with some blood streaking.   He had seen pulmonary who had recommended him come see [place] again and I saw him in November   He hadnot had fevers chills or other systemic symptoms.   He was apprehensive that he could be harboring resistant organisms.Out of his lungs.   Get I think there is certainly possibility that his mycobacteria could have developed some resistance and/or that he has a superimposed bacterial infection yet again.   At the time we switched out his azithromycin for levofloxacin.  His cough may have improved a bit in terms of his severity.  He says about 10 out of 10 in severity when he saw [place] last versus a 6-7.   Sputum is changed slightly now with a red tinge to it.   He was also seen by hematology oncology to work-up anemia at [facility] medical group.   In the context of his weight loss they also  checked him for viral hepatitis panel and [place]z hepatitis B surface antigen was positive along with a hepatitis B DNA though at a low value value of 2050 copies   Since seen Dr. Chase Farmer and is starting Xolair in addition to the [place] he is already taking along with systemic prednisone (in midst of a taper)   We checked for delta agent this was negative.  We checked hepatitis B E antigen antibody which was positive.   We did an ultrasound screening for hepatocellular carcinoma and this was negative for any lesions.   He is also seen GI and hepatology at [facility].  There are plans for both an upper endoscopy and lower endoscopy to evaluate his anemia.       Cannot have been using a percussive vest but then suffered rib fractures from this.  He was happy that he has gone nearly 3 years without being admitted to the hospital with hemoptysis when I saw him in November  Fortunately he was actually admitted to the hospital in April to  20th  2023 with hemoptysis again requiring intubation and mechanical ventilation.  MSSA was isolated from the lungs and he was treated for MSSA pneumonia.  Currently remains on his Cresemba as well as ethambutol although the azithromycin.  Hepatitis B DNA levels have been relatively low.  He is having worsening malaise and fatigue and looks visibly much more tired than usual.  He has had decreased pulse ox at home and been using the oxygen more at night although it sounds like he may needed to use it more frequently.  He has experienced increased cough and he wonders if he has a beginning of a bacterial pneumonia.  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   CAP (community acquired pneumonia) 03/09/2022   Chronic viral hepatitis B without delta-agent ([place]) 11/23/2020   Drug rash 12/16/2017   Dyspnea    Early satiety 01/16/2023   Esophageal reflux    very rare   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   Lung disease, bullous ([place])    MAI (mycobacterium avium-intracellulare) ([place])    Mold exposure 12/05/2015   Photosensitivity dermatitis 09/16/2017   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[place]) 03/14/2022   Rib fracture 02/08/2021   Solar lentigo 08/08/2015   Tension pneumothorax 09/04/2018   Vaccine counseling 12/28/2020   Weight loss 10/15/2016    Past Surgical History:  Procedure Laterality Date   BRONCHIAL WASHINGS  03/10/2022   Procedure: BRONCHIAL WASHINGS;  Surgeon: Laurin Coder, MD;  Location: [place] [facility];  Service: Pulmonary;;   CHEST TUBE INSERTION Right 09/29/2018   Procedure: CHEST TUBE REPLACEMENT;  Surgeon: Melrose Nakayama, MD;  Location: [place];  Service: Thoracic;   Laterality: Right;   INSERTION OF IBV VALVE N/A 09/19/2018   Procedure: INSERTION OF INTERBRONCHIAL VALVE (IBV);  Surgeon: Melrose Nakayama, MD;  Location: [facility] OR;  Service: Thoracic;  Laterality: N/A;   INSERTION OF IBV VALVE N/A 12/01/2018   Procedure: REMOVAL OF INTERBRONCHIAL VALVE (IBV);  Surgeon: Melrose Nakayama, MD;  Location: [facility] OR;  Service: Thoracic;  Laterality: N/A;   left VATS  2012   thoracotomy  and LUL apical posterior segmentectomy   STAPLING OF BLEBS Right 09/12/2018   Procedure: STAPLING OF BLEBS, right lower and middle lung lobes;  Surgeon: Melrose Nakayama, MD;  Location: [facility] OR;  Service: Thoracic;  Laterality: Right;   VIDEO ASSISTED THORACOSCOPY Right 09/12/2018   Procedure: VIDEO ASSISTED THORACOSCOPY, right lung;  Surgeon: Melrose Nakayama, MD;  Location: [place];  Service: Thoracic;  Laterality: Right;   VIDEO BRONCHOSCOPY Bilateral 10/20/2015   Procedure: VIDEO BRONCHOSCOPY WITHOUT FLUORO;  Surgeon: Marshell Garfinkel, MD;  Location: [place];  Service: Cardiopulmonary;  Laterality: Bilateral;   VIDEO BRONCHOSCOPY N/A 09/19/2018   Procedure: VIDEO BRONCHOSCOPY;  Surgeon: Melrose Nakayama, MD;  Location: [place];  Service: Thoracic;  Laterality: N/A;   VIDEO BRONCHOSCOPY N/A 12/01/2018   Procedure: VIDEO BRONCHOSCOPY;  Surgeon: Melrose Nakayama, MD;  Location: [place];  Service: Thoracic;  Laterality: N/A;   VIDEO BRONCHOSCOPY N/A 03/10/2022   Procedure: VIDEO BRONCHOSCOPY WITHOUT FLUORO;  Surgeon: Laurin Coder, MD;  Location: [place] [facility];  Service: Pulmonary;  Laterality: N/A;   VIDEO BRONCHOSCOPY WITH INSERTION OF INTERBRONCHIAL VALVE (IBV) N/A 09/29/2018   Procedure: VIDEO BRONCHOSCOPY WITH INSERTION OF INTERBRONCHIAL VALVE  (IBV);  Surgeon: Melrose Nakayama, MD;  Location: [facility] OR;  Service: Thoracic;  Laterality: N/A;    Family History  Adopted: Yes      Social History   Socioeconomic History   Marital status: Single    Spouse  name: Not on file   Number of children: Not on file   Years of education: Not on file   Highest education level: Not on file  Occupational History   Occupation: works 2 jobs  Tobacco Use   Smoking status: Former    Packs/day: 0.70    Years: 19.00    Total pack years: 13.30    Types: Cigarettes    Quit date: 11/20/2007    Years since quitting: 15.1   Smokeless tobacco: Former    Types: Snuff    Quit date: 11/19/1994  Vaping Use   Vaping Use: Never used  Substance and Sexual Activity   Alcohol use: No   Drug use: No   Sexual activity: Not on file  Other Topics Concern   Not on file  Social History Narrative   Pt is adopted   Patient works in [place] Strain: Not on file  Food Insecurity: Not on file  Transportation Needs: Not on file  Physical Activity: Not on file  Stress: Not on file  Social Connections: Not on file    Allergies  Allergen Reactions   Clarithromycin Other (See Comments)    Adverse reaction w/ voriconazole    Rifaximin Other (See Comments)    Adverse reaction w/ voriconazole      Current Outpatient Medications:    albuterol (PROVENTIL) (2.5 MG/3ML) 0.083% nebulizer solution, Take 3 mLs (2.5 mg total) by nebulization every 4 (four) hours as needed for wheezing or shortness of breath., Disp: 75 mL, Rfl: 12   albuterol (VENTOLIN HFA) 108 (90 Base) MCG/ACT inhaler, INHALE 2 PUFFS INTO THE LUNGS EVERY 6 HOURS AS NEEDED, Disp: 18 g, Rfl: 2   azithromycin (ZITHROMAX) 500 MG tablet, Take 1 tablet (500 mg total) by mouth daily., Disp: 30 tablet, Rfl: 11   Benralizumab (FASENRA PEN) 30 MG/ML SOAJ, Inject 1 mL (30 mg total) into the skin every 8 (eight) weeks., Disp: 1 mL, Rfl: 1   BREZTRI AEROSPHERE 160-9-4.8  MCG/ACT AERO, INHALE 2 PUFFS BY MOUTH TWICE DAILY, Disp: 10.7 g, Rfl: 1   Calcium Carb-Cholecalciferol 600-10 MG-MCG TABS, Take 1 tablet by mouth daily., Disp: , Rfl:    chlorpheniramine-HYDROcodone  10-8 MG/5ML, Take 5 mLs by mouth every 12 (twelve) hours as needed for cough., Disp: 115 mL, Rfl: 0   doxycycline (VIBRA-TABS) 100 MG tablet, Take 1 tablet (100 mg total) by mouth 2 (two) times daily., Disp: 14 tablet, Rfl: 0   EPINEPHrine 0.3 mg/0.3 mL IJ SOAJ injection, ADMINISTER 0.3 MG IN THE MUSCLE 1 TIME FOR 1 DOSE, Disp: 2 each, Rfl: 3   ethambutol (MYAMBUTOL) 400 MG tablet, Take 2.5 tablets (1,000 mg total) by mouth daily., Disp: 225 tablet, Rfl: 3   feeding supplement, ENSURE ENLIVE, (ENSURE ENLIVE) LIQD, Take 237 mLs by mouth 3 (three) times daily between meals. (Patient taking differently: Take 237 mLs by mouth 2 (two) times daily as needed (meal supplement).), Disp: 237 mL, Rfl: 12   ferrous sulfate 325 (65 FE) MG tablet, Take 325 mg by mouth every morning., Disp: , Rfl:    folic acid (FOLVITE) 1 MG tablet, Take 1 mg by mouth every other day., Disp: , Rfl:    Isavuconazonium Sulfate (CRESEMBA) 186 MG CAPS, TAKE 2 CAPSULES BY MOUTH DAILY, Disp: 60 capsule, Rfl: 11   Menthol-Methyl Salicylate (SALONPAS PAIN RELIEF PATCH EX), Place 1-2 patches onto the skin daily. Back pain, Disp: , Rfl:    mirtazapine (REMERON SOL-TAB) 45 MG disintegrating tablet, Take 45 mg by mouth at bedtime., Disp: , Rfl:    omalizumab (XOLAIR) 150 MG/ML prefilled syringe, INJECT 300 MG INTO THE SKIN EVERY 14 (FOURTEEN) DAYS. IN ADDITION TO '75MG'$  (TOTAL DOSE IS '375MG'$  EVERY 14 DAYS)., Disp: 4 mL, Rfl: 5   omalizumab (XOLAIR) 75 MG/0.5ML prefilled syringe, INJECT 75 MG INTO THE SKIN EVERY 14 (FOURTEEN) DAYS. IN ADDITION TO '300MG'$  (TOTAL DOSE IS '375MG'$  EVERY 14 DAYS). DELIVER TO PATIENT'S HOME., Disp: 1 mL, Rfl: 5   OXYGEN, Inhale 3 L into the lungs as needed (shortness of breath)., Disp: , Rfl:    predniSONE (DELTASONE) 10 MG tablet, Take 1 tablet (10 mg total) by mouth daily with breakfast., Disp: 90 tablet, Rfl: 3   sodium chloride HYPERTONIC 3 % nebulizer solution, USE NEBULIZEER DAILY AS DIRECTED, Disp: 750 mL, Rfl: 5    TIROSINT-SOL 88 MCG/ML SOLN, Take by mouth., Disp: , Rfl:   Review of Systems  Constitutional:  Positive for appetite change and unexpected weight change. Negative for chills, diaphoresis, fatigue and fever.  HENT:  Negative for congestion, rhinorrhea, sinus pressure, sneezing, sore throat and trouble swallowing.   Eyes:  Negative for photophobia and visual disturbance.  Respiratory:  Positive for cough. Negative for chest tightness, shortness of breath, wheezing and stridor.   Cardiovascular:  Negative for chest pain, palpitations and leg swelling.  Gastrointestinal:  Positive for nausea. Negative for abdominal distention, abdominal pain, anal bleeding, blood in stool, constipation, diarrhea and vomiting.  Genitourinary:  Negative for difficulty urinating, dysuria, flank pain and hematuria.  Musculoskeletal:  Negative for arthralgias, back pain, gait problem, joint swelling and myalgias.  Skin:  Negative for color change, pallor, rash and wound.  Neurological:  Negative for dizziness, tremors, weakness and light-headedness.  Hematological:  Negative for adenopathy. Does not bruise/bleed easily.  Psychiatric/Behavioral:  Negative for agitation, behavioral problems, confusion, decreased concentration, dysphoric mood and sleep disturbance.        Objective:   Physical Exam Constitutional:  Appearance: He is well-developed.  HENT:     Head: Normocephalic and atraumatic.  Eyes:     Conjunctiva/sclera: Conjunctivae normal.  Cardiovascular:     Rate and Rhythm: Normal rate and regular rhythm.     Heart sounds: No murmur heard.    No friction rub. No gallop.  Pulmonary:     Effort: Prolonged expiration present. No respiratory distress.     Breath sounds: No stridor. No wheezing.  Abdominal:     General: There is no distension.     Palpations: Abdomen is soft.  Musculoskeletal:        General: No tenderness. Normal range of motion.     Cervical back: Normal range of motion and neck  supple.  Skin:    General: Skin is warm and dry.     Coloration: Skin is not pale.     Findings: No erythema or rash.  Neurological:     General: No focal deficit present.     Mental Status: He is alert and oriented to person, place, and time.  Psychiatric:        Mood and Affect: Mood normal.        Behavior: Behavior normal.        Thought Content: Thought content normal.        Judgment: Judgment normal.           Assessment & Plan:    Increased cough with nausea malaise fatigue:  Indeed he certainly could have a superimposed bacterial pneumonia again.  I agree with him seeing pulmonary and getting a chest x-ray.  I will order blood cultures x 2 sites as well as CMP with CBC differential.    Nausea malaise reduced appetite: Will check a CMP as well as lipase and amylase: He does not endorse dysphagia but if this problem persisted may need referral to GI  Aspergilloma and allergic bronchopulmonary aspergillosis: Continue Cresemba checking LFTs     Mycobacterium AVM: Ampoule azithromycin.  Chronic hepatitis C without hepatic coma: Hep B DNA levels have been low we will recheck levels on next visit          Aspergilloma and allergic bronchopulmonary aspergillosis: Continue his Cresemba.  Mycobacterium avium infection of the lungs: Continue ethambutol and azithromycin.

## 2023-01-16 NOTE — Progress Notes (Signed)
$'@Patient'E$  ID: [name], male    DOB: [birth year], 47 y.o.   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  Chronic respiratory failure on oxygen (started February 2022). 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 with prednisone 10 mg daily, Xolair and Fasenra History of MAC followed by infectious disease on at that albuterol and azithromycin History of tension pneumothorax in 2019 status post pleurodesis and endobronchial valves History of chronic hep B  TEST/EVENTS :  7/13 Ig E 800s ( ~4000 2012 )  12/10/11 Sputum AFB  Smear and culture negative (prelim) 12/10/11 Fungal sputum culture - negative (final) 01/30/2012 spirometry - fev1 1.7L/41%, ratip 52 - BEST EVER 01/30/2012 walking desaturation test: 185 feet x 3 laps: did not deaturate  05/2014 >Spiro fev1 1.7L/40%, ratio 55  Spirometry 06/25/2017 showed FEV1 at 28%, ratio 46, FVC 50%. CT chest July 2021 showed chronic narrowing and occlusion of several of the right upper lobe segmental and subsegmental pulmonary arteries additional filling defects in the left upper lobe segmental and subsegmental branches.  Extensive bullous disease.  Architectural distortion most suggestive of chronic embolus/occlusion, right atrial enlargement , Groundglass and consolidative opacity in the left lower and right lower periphery   Sputum culture July 2021 + for Klebsiella pneumonia -pansensitive except for ampicillin.   August 2021 2D echo showed EF 123456, grade 1 diastolic dysfunction mildly elevated pulmonary  artery systolic pressure at 39 mmHg   Events  2010 Dx with MAI >Azithro/Ethambutol 2012 Dx with Aspergilloma >VATS >started on Voriconazaol  2012 Dx w/ ABPA w/ High IgE >4000>started on prednisone  2013 tried off azithr/Etham but developed fever, Cx neg but restarted on rx.  2016 Hospitalzed with hemoptysis >IR guided embolization . FOB/BAL + Saccharomyces on fungal fx . Serum Galactomannan was neg.  [name] evaluation 04/2017 for Transplant -rejected. Felt to early for transplant - recommended pre-transplant weight goal 128 lbs for a BMI > 19  01/16/2023 Follow up: Severe persistent asthma, ABPA, aspergilloma, MAC, hemoptysis, chronic respiratory failure Patient presents for work in visit.  He complains over the last 2 weeks his breathing has not been doing as good.  He has had increased cough and congestion that has been constant throughout the day.  Mucus is very thick.  He gets short of breath with intermittent wheezing.  No hemoptysis or fever.  Patient says that appetite remains very low.  He remains on an aggressive regimen with Fasenra, Xolair, hypertonic nebs.  Continues to have nasal congestion and drainage.  He is on prednisone 10 mg daily.  And uses Breztri twice daily. Patient denies any vomiting or diarrhea. Patient supposed to be on oxygen with activity.  Patient did not wear his oxygen in the office today , O2 sats 84% , placed on O2 with sats 98%.  We discussed importance of using his oxygen.   Allergies  Allergen Reactions   Clarithromycin Other (See Comments)    Adverse reaction w/ voriconazole    Rifaximin Other (See Comments)    Adverse reaction w/ voriconazole     Immunization History  Administered Date(s) Administered   COVID-19, mRNA,  vaccine(Comirnaty)12 years and older 10/02/2022   DTaP 11/19/2010   Influenza Split 10/29/2011, 08/14/2012   Influenza Whole 09/19/2010   Influenza,inj,Quad PF,6+ Mos 11/23/2013, 09/28/2014, 09/06/2015, 08/27/2016, 08/10/2017,  09/09/2018, 08/06/2019, 08/10/2020, 09/27/2021, 10/02/2022   Influenza-Unspecified 10/29/2011, 08/14/2012, 11/23/2013, 09/28/2014, 08/27/2016, 08/10/2017, 09/09/2018, 08/06/2019, 08/10/2020   PFIZER Comirnaty(Gray Top)Covid-19 Tri-Sucrose Vaccine 12/28/2020   PFIZER(Purple Top)SARS-COV-2 Vaccination 02/18/2020, 03/14/2020, 08/10/2020   PNEUMOCOCCAL CONJUGATE-20 07/04/2022   Pfizer Covid-19 Vaccine Bivalent Booster 65yr & up 10/04/2021, 04/04/2022   Pneumococcal Conjugate-13 04/29/2017, 04/29/2017   Pneumococcal Polysaccharide-23 12/17/2010   Tdap 09/27/2021    Past Medical History:  Diagnosis Date   Anemia    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[facility]    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   CAP (community acquired pneumonia) 03/09/2022   Chronic viral hepatitis B without delta-agent ([place] 11/23/2020   Drug rash 12/16/2017   Dyspnea    Early satiety 01/16/2023   Esophageal reflux    very rare   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   Lung disease, bullous ([facility]    MAI (mycobacterium avium-intracellulare) ([facility]    Mold exposure 12/05/2015   Photosensitivity dermatitis 09/16/2017   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[facility] 03/14/2022   Rib fracture 02/08/2021   Solar lentigo 08/08/2015   Tension pneumothorax 09/04/2018   Vaccine counseling 12/28/2020   Weight loss 10/15/2016    Tobacco History: Social History   Tobacco Use  Smoking Status Former   Packs/day: 0.70   Years: 19.00   Total pack years: 13.30   Types: Cigarettes   Quit date: 11/20/2007   Years since quitting: 15.1  Smokeless Tobacco Former   Types: Snuff   Quit date: 11/19/1994   Counseling given: Not Answered   Outpatient Medications Prior to Visit  Medication Sig Dispense Refill   albuterol (PROVENTIL) (2.5 MG/3ML) 0.083% nebulizer solution Take 3 mLs (2.5 mg total) by nebulization every 4 (four) hours as needed for wheezing or shortness of breath. 75 mL 12   albuterol (VENTOLIN HFA) 108 (90 Base)  MCG/ACT inhaler INHALE 2 PUFFS INTO THE LUNGS EVERY 6 HOURS AS NEEDED 18 g 2   Benralizumab (FASENRA PEN) 30 MG/ML SOAJ Inject 1 mL (30 mg total) into the skin every 8 (eight) weeks. 1 mL 1   BREZTRI AEROSPHERE 160-9-4.8 MCG/ACT AERO INHALE 2 PUFFS BY MOUTH TWICE DAILY 10.7 g 1   Calcium Carb-Cholecalciferol 600-10 MG-MCG TABS Take 1 tablet by mouth daily.     EPINEPHrine 0.3 mg/0.3 mL IJ SOAJ injection ADMINISTER 0.3 MG IN THE MUSCLE 1 TIME FOR 1 DOSE 2 each 3   ethambutol (MYAMBUTOL) 400 MG tablet Take 2.5 tablets (1,000 mg total) by mouth daily. 225 tablet 3   feeding supplement, ENSURE ENLIVE, (ENSURE ENLIVE) LIQD Take 237 mLs by mouth 3 (three) times daily between meals. (Patient taking differently: Take 237 mLs by mouth 2 (two) times daily as needed (meal supplement).) 237 mL 12   ferrous sulfate 325 (65 FE) MG tablet Take 325 mg by mouth every morning.     folic acid (FOLVITE) 1 MG tablet Take 1 mg by mouth every other day.     Isavuconazonium Sulfate (CRESEMBA) 186 MG CAPS TAKE 2 CAPSULES BY MOUTH DAILY 60 capsule 11   Menthol-Methyl Salicylate (SALONPAS PAIN RELIEF PATCH EX) Place 1-2 patches onto the skin daily. Back pain     mirtazapine (REMERON SOL-TAB) 45 MG disintegrating tablet Take 45 mg by mouth at bedtime.     omalizumab (Arvid Right 150 MG/ML prefilled syringe INJECT  300 MG INTO THE SKIN EVERY 14 (FOURTEEN) DAYS. IN ADDITION TO '75MG'$  (TOTAL DOSE IS '375MG'$  EVERY 14 DAYS). 4 mL 5   omalizumab (XOLAIR) 75 MG/0.5ML prefilled syringe INJECT 75 MG INTO THE SKIN EVERY 14 (FOURTEEN) DAYS. IN ADDITION TO '300MG'$  (TOTAL DOSE IS '375MG'$  EVERY 14 DAYS). DELIVER TO PATIENT'S HOME. 1 mL 5   OXYGEN Inhale 3 L into the lungs as needed (shortness of breath).     sodium chloride HYPERTONIC 3 % nebulizer solution USE NEBULIZEER DAILY AS DIRECTED 750 mL 5   TIROSINT-SOL 88 MCG/ML SOLN Take by mouth.     azithromycin (ZITHROMAX) 500 MG tablet Take 1 tablet (500 mg total) by mouth daily. 30 tablet 11    chlorpheniramine-HYDROcodone 10-8 MG/5ML Take 5 mLs by mouth every 12 (twelve) hours as needed for cough. 115 mL 0   doxycycline (VIBRA-TABS) 100 MG tablet Take 1 tablet (100 mg total) by mouth 2 (two) times daily. 14 tablet 0   predniSONE (DELTASONE) 10 MG tablet Take 1 tablet (10 mg total) by mouth daily with breakfast. 90 tablet 3   No facility-administered medications prior to visit.     Review of Systems:   Constitutional:   No  night sweats,  Fevers, chills, + fatigue, or  lassitude.  HEENT:   No headaches,  Difficulty swallowing,  Tooth/dental problems, or  Sore throat,                No sneezing, itching, ear ache, nasal congestion, post nasal drip,   CV:  No chest pain,  Orthopnea, PND, swelling in lower extremities, anasarca, dizziness, palpitations, syncope.   GI  No heartburn, indigestion, abdominal pain, nausea, vomiting, diarrhea, change in bowel habits, loss of appetite, bloody stools.   Resp:   No chest wall deformity  Skin: no rash or lesions.  GU: no dysuria, change in color of urine, no urgency or frequency.  No flank pain, no hematuria   MS:  No joint pain or swelling.  No decreased range of motion.  No back pain.    Physical Exam  BP 120/76   Pulse 86   SpO2 98%   GEN: A/Ox3; pleasant , NAD, thin, cachectic   HEENT:  [place]/AT,  EACs-clear, TMs-wnl, NOSE-clear, THROAT-clear, no lesions, no postnasal drip or exudate noted.   NECK:  Supple w/ fair ROM; no JVD; normal carotid impulses w/o bruits; no thyromegaly or nodules palpated; no lymphadenopathy.    RESP scattered rhonchi right greater than left no accessory muscle use, no dullness to percussion  CARD:  RRR, no m/r/g, no peripheral edema, pulses intact, no cyanosis or clubbing.  GI:   Soft & nt; nml bowel sounds; no organomegaly or masses detected.   Musco: Warm bil, no deformities or joint swelling noted.   Neuro: alert, no focal deficits noted.    Skin: Warm, no lesions or rashes    Lab  Results:  CBC    Component Value Date/Time   WBC 10.5 07/04/2022 1211   RBC 4.42 07/04/2022 1211   HGB 14.0 07/04/2022 1211   HCT 41.5 07/04/2022 1211   PLT 282 07/04/2022 1211   MCV 93.9 07/04/2022 1211   MCH 31.7 07/04/2022 1211   MCHC 33.7 07/04/2022 1211   RDW 13.6 07/04/2022 1211   LYMPHSABS 399 (L) 07/04/2022 1211   MONOABS 0.5 03/11/2022 0552   EOSABS 0 (L) 07/04/2022 1211   BASOSABS 11 07/04/2022 1211    BMET    Component Value Date/Time   NA  138 07/04/2022 1211   K 3.9 07/04/2022 1211   CL 102 07/04/2022 1211   CO2 27 07/04/2022 1211   GLUCOSE 128 (H) 07/04/2022 1211   BUN 9 07/04/2022 1211   CREATININE 0.78 07/04/2022 1211   CALCIUM 8.6 07/04/2022 1211   GFRNONAA >60 03/15/2022 0650   GFRNONAA 107 11/23/2020 1202   GFRAA 124 11/23/2020 1202    BNP    Component Value Date/Time   BNP 73.0 03/08/2022 1303    ProBNP    Component Value Date/Time   PROBNP 97.0 03/07/2011 0918    Imaging: DG Chest 2 View  Result Date: 01/16/2023 CLINICAL DATA:  Bronchiectasis. EXAM: CHEST - 2 VIEW COMPARISON:  Chest CT 05/02/2022 and chest radiograph from 10/02/2022. FINDINGS: Stable cardiomediastinal contours. Severe emphysema. Extensive, bilateral, upper lung zone predominant confluent fibrosis and scarring with upward retraction of the hilar structures. Chronic, biapical, thick wall cavitary are again noted. Compared with the previous chest radiograph from 10/02/2022 there are no signs of acute superimposed airspace disease, pleural effusion or edema. The visualized osseous structures are notable for stable chronic compression deformities within the upper thoracic spine. IMPRESSION: 1. No acute cardiopulmonary abnormalities. 2. Severe emphysema and chronic, bilateral, upper lung zone predominant confluent fibrosis and scarring with upward retraction of the hilar structures. 3. Similar appearance biapical thick wall cavitary changes as before. Electronically Signed   By: Kerby Moors M.D.   On: 01/16/2023 12:56         Latest Ref Rng & Units 11/30/2020   11:13 AM 06/25/2017    9:11 AM 08/06/2016    9:09 AM  PFT Results  FVC-Pre L 2.09  2.52  2.84   FVC-Predicted Pre % 47  50  61   FVC-Post L 2.16   2.93   FVC-Predicted Post % 49   63   Pre FEV1/FVC % % 56  46  53   Post FEV1/FCV % % 56   56   FEV1-Pre L 1.17  1.16  1.50   FEV1-Predicted Pre % 33  28  39   FEV1-Post L 1.22   1.63   DLCO uncorrected ml/min/mmHg 15.81   18.56   DLCO UNC% % 53   59   DLCO corrected ml/min/mmHg 16.67   18.35   DLCO COR %Predicted % 56   59   DLVA Predicted % 123   108   TLC L   5.14   TLC % Predicted %   76   RV % Predicted %   129     No results found for: "NITRICOXIDE"      Assessment & Plan:   Asthmatic bronchitis , chronic (HCC) Acute exacerbation.  Chest x-ray today showed no acute process.  Will treat with empiric antibiotics and steroid burst.  Plan  . Patient Instructions  Chest xray today .  Augmentin '875mg'$  Twice daily for  7 days , take with food .  Continue on BREZTRI 2 puffs Twice daily  , rinse after use.  Prednisone taper over next week, then resume '10mg'$  daily  Flutter valve Three times a day  .  Please eat often,  High protein food and drinks. -try high calorie boost.  Continue on Fasenra and Xolair .  Continue with ID follow up.  Continue on Oxygen 2l/m with activity and At bedtime, to keep O2 sats >88-90%. -Please wear with activity .  Albuterol inhaler As needed  Wheezing/shortness of breath .  Albuterol neb every 6hrs as needed for wheezing /  shortness of breath as needed.  Hypertonic nebs 1-2 twice daily  Add Zyrtec '10mg'$  At bedtime  .  Robitussin DM As needed  Cough/congestion  Hycodan cough syrup for severe cough , may make you sleepy .  Follow up with [name] or Jibreel Fedewa NP in 3-4  weeks  and As needed   Please contact office for sooner follow up if symptoms do not improve or worsen or seek emergency care      ABPA (allergic  bronchopulmonary aspergillosis) ([place]) Will continue on current regimen.  Currently having a flare of asthma.  Prednisone taper and then resume back to daily dosing of prednisone 10 mg daily  Chronic respiratory failure with hypoxia ([place]) Patient is encouraged on oxygen compliance.  Continue on 2 L with activity.  Plan  Patient Instructions  Chest xray today .  Augmentin '875mg'$  Twice daily for  7 days , take with food .  Continue on BREZTRI 2 puffs Twice daily  , rinse after use.  Prednisone taper over next week, then resume '10mg'$  daily  Flutter valve Three times a day  .  Please eat often,  High protein food and drinks. -try high calorie boost.  Continue on Fasenra and Xolair .  Continue with ID follow up.  Continue on Oxygen 2l/m with activity and At bedtime, to keep O2 sats >88-90%. -Please wear with activity .  Albuterol inhaler As needed  Wheezing/shortness of breath .  Albuterol neb every 6hrs as needed for wheezing /shortness of breath as needed.  Hypertonic nebs 1-2 twice daily  Add Zyrtec '10mg'$  At bedtime  .  Robitussin DM As needed  Cough/congestion  Hycodan cough syrup for severe cough , may make you sleepy .  Follow up with 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  Bronchiectasis ([place]) Continue with mucociliary clearance.  Encouraged to use his flutter valve at least 3 times a day along with his hypertonic nebs once or twice daily.   I spent 42   minutes dedicated to the care of this patient on the date of this encounter to include pre-visit review of records, face-to-face time with the patient discussing conditions above, post visit ordering of  testing, clinical documentation with the electronic health record, making appropriate referrals as documented, and communicating necessary findings to members of the patients care team.    Rexene Edison, NP 01/16/2023

## 2023-01-16 NOTE — Assessment & Plan Note (Signed)
Patient is encouraged on oxygen compliance.  Continue on 2 L with activity.  Plan  Patient Instructions  Chest xray today .  Augmentin '875mg'$  Twice daily for  7 days , take with food .  Continue on BREZTRI 2 puffs Twice daily  , rinse after use.  Prednisone taper over next week, then resume '10mg'$  daily  Flutter valve Three times a day  .  Please eat often,  High protein food and drinks. -try high calorie boost.  Continue on Fasenra and Xolair .  Continue with ID follow up.  Continue on Oxygen 2l/m with activity and At bedtime, to keep O2 sats >88-90%. -Please wear with activity .  Albuterol inhaler As needed  Wheezing/shortness of breath .  Albuterol neb every 6hrs as needed for wheezing /shortness of breath as needed.  Hypertonic nebs 1-2 twice daily  Add Zyrtec '10mg'$  At bedtime  .  Robitussin DM As needed  Cough/congestion  Hycodan cough syrup for severe cough , may make you sleepy .  Follow up with [name] or Bridger Pizzi NP in 3-4  weeks  and As needed   Please contact office for sooner follow up if symptoms do not improve or worsen or seek emergency care

## 2023-01-16 NOTE — Patient Instructions (Addendum)
Chest xray today .  Augmentin '875mg'$  Twice daily for  7 days , take with food .  Continue on BREZTRI 2 puffs Twice daily  , rinse after use.  Prednisone taper over next week, then resume '10mg'$  daily  Flutter valve Three times a day  .  Please eat often,  High protein food and drinks. -try high calorie boost.  Continue on Fasenra and Xolair .  Continue with ID follow up.  Continue on Oxygen 2l/m with activity and At bedtime, to keep O2 sats >88-90%. -Please wear with activity .  Albuterol inhaler As needed  Wheezing/shortness of breath .  Albuterol neb every 6hrs as needed for wheezing /shortness of breath as needed.  Hypertonic nebs 1-2 twice daily  Add Zyrtec '10mg'$  At bedtime  .  Robitussin DM As needed  Cough/congestion  Hycodan cough syrup for severe cough , may make you sleepy .  Follow up with [name] or Kaushik Maul NP in 3-4  weeks  and As needed   Please contact office for sooner follow up if symptoms do not improve or worsen or seek emergency care

## 2023-01-16 NOTE — Assessment & Plan Note (Signed)
Continue follow-up with infectious disease and current maintenance regimen 

## 2023-01-16 NOTE — Assessment & Plan Note (Signed)
Acute exacerbation.  Chest x-ray today showed no acute process.  Will treat with empiric antibiotics and steroid burst.  Plan  . Patient Instructions  Chest xray today .  Augmentin '875mg'$  Twice daily for  7 days , take with food .  Continue on BREZTRI 2 puffs Twice daily  , rinse after use.  Prednisone taper over next week, then resume '10mg'$  daily  Flutter valve Three times a day  .  Please eat often,  High protein food and drinks. -try high calorie boost.  Continue on Fasenra and Xolair .  Continue with ID follow up.  Continue on Oxygen 2l/m with activity and At bedtime, to keep O2 sats >88-90%. -Please wear with activity .  Albuterol inhaler As needed  Wheezing/shortness of breath .  Albuterol neb every 6hrs as needed for wheezing /shortness of breath as needed.  Hypertonic nebs 1-2 twice daily  Add Zyrtec '10mg'$  At bedtime  .  Robitussin DM As needed  Cough/congestion  Hycodan cough syrup for severe cough , may make you sleepy .  Follow up with [name] or Antavius Sperbeck NP in 3-4  weeks  and As needed   Please contact office for sooner follow up if symptoms do not improve or worsen or seek emergency care

## 2023-01-17 NOTE — Telephone Encounter (Signed)
Chest x-ray shows no acute pneumonia.  Chronic scarring changes noted.  Continue with office visit recommendations and follow-up  Please contact office for sooner follow up if symptoms do not improve or worsen or seek emergency care

## 2023-01-22 LAB — CBC WITH DIFFERENTIAL/PLATELET
Absolute Monocytes: 509 cells/uL (ref 200–950)
Basophils Absolute: 13 cells/uL (ref 0–200)
Basophils Relative: 0.1 %
Eosinophils Absolute: 0 cells/uL — ABNORMAL LOW (ref 15–500)
Eosinophils Relative: 0 %
HCT: 38.8 % (ref 38.5–50.0)
Hemoglobin: 13.3 g/dL (ref 13.2–17.1)
Lymphs Abs: 737 cells/uL — ABNORMAL LOW (ref 850–3900)
MCH: 32.3 pg (ref 27.0–33.0)
MCHC: 34.3 g/dL (ref 32.0–36.0)
MCV: 94.2 fL (ref 80.0–100.0)
MPV: 9 fL (ref 7.5–12.5)
Monocytes Relative: 3.8 %
Neutro Abs: 12140 cells/uL — ABNORMAL HIGH (ref 1500–7800)
Neutrophils Relative %: 90.6 %
Platelets: 294 10*3/uL (ref 140–400)
RBC: 4.12 10*6/uL — ABNORMAL LOW (ref 4.20–5.80)
RDW: 13 % (ref 11.0–15.0)
Total Lymphocyte: 5.5 %
WBC: 13.4 10*3/uL — ABNORMAL HIGH (ref 3.8–10.8)

## 2023-01-22 LAB — COMPLETE METABOLIC PANEL WITH GFR
AG Ratio: 1 (calc) (ref 1.0–2.5)
ALT: 6 U/L — ABNORMAL LOW (ref 9–46)
AST: 13 U/L (ref 10–40)
Albumin: 3.6 g/dL (ref 3.6–5.1)
Alkaline phosphatase (APISO): 97 U/L (ref 36–130)
BUN: 9 mg/dL (ref 7–25)
CO2: 29 mmol/L (ref 20–32)
Calcium: 8.8 mg/dL (ref 8.6–10.3)
Chloride: 102 mmol/L (ref 98–110)
Creat: 0.76 mg/dL (ref 0.60–1.29)
Globulin: 3.6 g/dL (calc) (ref 1.9–3.7)
Glucose, Bld: 86 mg/dL (ref 65–99)
Potassium: 4.1 mmol/L (ref 3.5–5.3)
Sodium: 139 mmol/L (ref 135–146)
Total Bilirubin: 0.5 mg/dL (ref 0.2–1.2)
Total Protein: 7.2 g/dL (ref 6.1–8.1)
eGFR: 111 mL/min/{1.73_m2} (ref >=60)

## 2023-01-22 LAB — CULTURE, BLOOD (SINGLE)
MICRO NUMBER:: 14628195
MICRO NUMBER:: 14628197
Result:: NO GROWTH
Result:: NO GROWTH
SPECIMEN QUALITY:: ADEQUATE
SPECIMEN QUALITY:: ADEQUATE

## 2023-01-22 LAB — C-REACTIVE PROTEIN: CRP: 111.5 mg/L — ABNORMAL HIGH (ref <8.0)

## 2023-01-22 LAB — AMYLASE: Amylase: 69 U/L (ref 21–101)

## 2023-01-22 LAB — LIPASE: Lipase: 37 U/L (ref 7–60)

## 2023-01-22 LAB — SEDIMENTATION RATE: Sed Rate: 63 mm/h — ABNORMAL HIGH (ref 0–15)

## 2023-01-23 NOTE — Telephone Encounter (Signed)
Patient was seen by Joellen Jersey on 02/28 and the issue was addressed then.   Will close encounter.

## 2023-02-04 DIAGNOSIS — J9611 Chronic respiratory failure with hypoxia: Secondary | ICD-10-CM | POA: Diagnosis not present

## 2023-02-06 ENCOUNTER — Other Ambulatory Visit (HOSPITAL_COMMUNITY): Payer: Self-pay

## 2023-02-06 DIAGNOSIS — R200 Anesthesia of skin: Secondary | ICD-10-CM | POA: Diagnosis not present

## 2023-02-06 DIAGNOSIS — R202 Paresthesia of skin: Secondary | ICD-10-CM | POA: Diagnosis not present

## 2023-02-06 DIAGNOSIS — D509 Iron deficiency anemia, unspecified: Secondary | ICD-10-CM | POA: Diagnosis not present

## 2023-02-06 DIAGNOSIS — Z13220 Encounter for screening for lipoid disorders: Secondary | ICD-10-CM | POA: Diagnosis not present

## 2023-02-07 ENCOUNTER — Ambulatory Visit: Payer: BC Managed Care – PPO | Admitting: Internal Medicine

## 2023-02-12 ENCOUNTER — Other Ambulatory Visit (HOSPITAL_COMMUNITY): Payer: Self-pay

## 2023-02-20 ENCOUNTER — Ambulatory Visit: Payer: BC Managed Care – PPO | Admitting: Infectious Disease

## 2023-02-20 DIAGNOSIS — R202 Paresthesia of skin: Secondary | ICD-10-CM | POA: Diagnosis not present

## 2023-02-20 DIAGNOSIS — R200 Anesthesia of skin: Secondary | ICD-10-CM | POA: Diagnosis not present

## 2023-02-20 NOTE — Progress Notes (Deleted)
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 We had taken him off his Mycobacterium avium drugs and then he developed fevers chills and malaise  Spring (2013) and was seen by my partner Dr. Megan Salon in late April  His AFB smears and cultures obtained in the clinic on that day prior to the initiation of azithromycin ethambutol ultimately failed to grow Mycobacterium avium. He had been doing well I saw him in [place] 2013 we discontinued his azithromycin and ethambutol again. Unfortunately he again shortly thereafter developed fevers cough malaise. He started back on azithromycin ethambutol. Cultures done for AFB here were again negative. The patient however felt dramatically better having been back on azithromycin and ethambutol.    I saw him in January of 2015 when he was feeling relatively well.   He did have an episode of coughing up a few flecks of blood in January 2016 which prompted appt with [facility] but by time he made appt this had long since resolved. Was only a one time episode none since then.   I saw again in March and then in September 2016. In summer he was hospitalized with hemoptysis and found to have an area in the upper lobe consistent with an aspergilloma. He underwent bronchoscopy with BAL and cultures ultimately yielded a Saccharomyces species on fungal culture. His serum galactomannan was negative.   He had been  on voriconazole and axial has had radiographic improvement  and his x-ray findings. He also feelt etter symptomatically with no more hemoptysis improvement in his cough improvement in his energy though he still feels not nearly as well as he did prior to his hospitalization. He is avoiding exercised during the winter as it often precipitates coughing spells. He was  only taking 200 mg twice a day of voriconazole rather than the 300 mg he has continued this along with his ethambutol and azithromycin. He continued to have cough more so with exercise and has not been exercising at "[employer]" due to the fact this elicits more coughing. He has not smoked since 2007. He is seeing [name] and is being considered for new drug infusion to treat his COPD.   He did have 20# weight loss that he attributed to increased exercise.   He then saw  [name] after an episode of hematemesis. 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 He said he only coughed up a small amount of blood material and did not go to the ER but was seen and followed by pulmonary. Had a chest x-ray which showed no new findings.   He was then  going to be evaluated by the transplant team at [facility].   He did complain of hyperpigmented spots on sun exposed areas on his body including arms and neck which his dermatologist have told him is due to his voriconazole use.    We considered change at that time   Since that time he has [place] been admitted with hemoptysis at [facility].    Repeat CT  lungs showed on 08/09/17:   Large emphysematous bulla are noted in both upper lobes. There is interval development of fluid density in several bulla in the left upper lobe concerning for superimposed infection, possibly fungal in origin.   Stable bilateral calcified pulmonary nodules are noted consistent with prior granulomatous disease.   Stable tree in bud appearance is noted medially in the left lung posteriorly consistent with sequela of previous atypical  infection.     Due to his skin toxicity we decided to change him to [facility] and he has been on that for some time now.  He had problems with bilateral pneumothoraces.  This required placement of an intrabronchial valve in November 2019.  This resolved but then he had to be taken back to the operating room in early January where the intrabronchial valves were removed.   Was seen at [facility] medical transplant team as well but felt not yet to be a transplant candidate due to the fact that his lungs were not in severe enough condition to warrant transplantation   He had applied for disability but this has not yet gone through.  Certainly he deserves to have disability and he has significant medical problems.  He did however eventually go back to work at [employer]   He is been receiving his antimycobacterial drugs and is [place].   I last saw cans he was hospitalized in July 2021  for what he says was pneumonia.  The CT scan was read as showing:   " Chronic narrowing and occlusion of several right upper lobe segmental subsegmental pulmonary arteries and filling defects in the left upper lobe segmental and subsegmental branches which were within the regions of bullous disease which are suggestive of chronic embolism/occlusion.   It showed extensive bullous and paraseptal predominant emphysematous changes with thick-walled cystic bullae toward the apices with nodularity and air layering air-fluid levels within the left apical bulla consistent with aspergilloma.   He had mixed areas of groundglass and consolidative opacity left lower lobe and right lower lobe periphery.   Nares note indicates that he was not anticoagulated due to the fact that he has recurrent hemoptysis per the patient himself was unaware that he was found to have anything suggestive clots on scan and believes he was only treated for pneumonia.   Since I last saw Mickie he was treated with Augmentin and corticosteroids for  flare of bronchitis.   He improved on both steroids and antibiotics but within 3 to 5 days after coming off the Augmentin had worsening cough with productive sputum particularly in the morning some with some blood streaking.   He had seen pulmonary who had recommended him come see [place] again and I saw him in November   He hadnot had fevers chills or other systemic symptoms.   He was apprehensive that he could be harboring resistant organisms.Out of his lungs.   Get I think there is certainly possibility that his mycobacteria could have developed some resistance and/or that he has a superimposed bacterial infection yet again.   At the time we switched out his azithromycin for levofloxacin.  His cough may have improved a bit in terms of his severity.  He says about 10 out of 10 in severity when he saw [place] last versus a 6-7.   Sputum is changed slightly now with a red tinge to it.   He was also seen by hematology oncology to work-up anemia at [facility].   In the  context of his weight loss they also checked him for viral hepatitis panel and Kantz hepatitis B surface antigen was positive along with a hepatitis B DNA though at a low value value of 2050 copies   Since seen [name] and is starting Xolair in addition to the [place] he is already taking along with systemic prednisone (in midst of a taper)   We checked for delta agent this was negative.  We checked hepatitis B E antigen antibody which was positive.   We did an ultrasound screening for hepatocellular carcinoma and this was negative for any lesions.   He is also seen GI and hepatology at [facility].  There are plans for both an upper endoscopy and lower endoscopy to evaluate his anemia.       Cannot have been using a percussive vest but then suffered rib fractures from this.  He was happy that he has gone nearly 3 years without being admitted to the hospital with hemoptysis when I saw him in  November  Fortunately he was actually admitted to the hospital in April to 20th  2023 with hemoptysis again requiring intubation and mechanical ventilation.  MSSA was isolated from the lungs and he was treated for MSSA pneumonia.  Currently remains on his Cresemba as well as ethambutol although the azithromycin.  Hepatitis B DNA levels have been relatively low.  He is having worsening malaise and fatigue and looks visibly much more tired than usual.  He has had decreased pulse ox at home and been using the oxygen more at night although it sounds like he may needed to use it more frequently.  He has experienced increased cough and he wonders if he has a beginning of a bacterial pneumonia.  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   CAP (community acquired pneumonia) 03/09/2022   Chronic viral hepatitis B without delta-agent ([place]) 11/23/2020   Drug rash 12/16/2017   Dyspnea    Early satiety 01/16/2023   Esophageal reflux    very rare   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   Lung disease, bullous ([place])    MAI (mycobacterium avium-intracellulare) ([place])    Mold exposure 12/05/2015   Photosensitivity dermatitis 09/16/2017   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[place]) 03/14/2022   Rib fracture 02/08/2021   Solar lentigo 08/08/2015   Tension pneumothorax 09/04/2018   Vaccine counseling 12/28/2020   Weight loss 10/15/2016    Past Surgical History:  Procedure Laterality Date   BRONCHIAL WASHINGS  03/10/2022   Procedure: BRONCHIAL WASHINGS;  Surgeon: Laurin Coder, MD;  Location: [facility];  Service: Pulmonary;;   CHEST TUBE INSERTION Right 09/29/2018   Procedure: CHEST TUBE REPLACEMENT;   Surgeon: Melrose Nakayama, MD;  Location: [place];  Service: Thoracic;  Laterality: Right;   INSERTION OF IBV VALVE N/A 09/19/2018   Procedure: INSERTION OF INTERBRONCHIAL VALVE (IBV);  Surgeon: Melrose Nakayama, MD;  Location[facility] OR;  Service: Thoracic;  Laterality: N/A;   INSERTION OF IBV VALVE N/A 12/01/2018   Procedure: REMOVAL OF INTERBRONCHIAL VALVE (IBV);  Surgeon: Melrose Nakayama, MD;  Location: [place];  Service: Thoracic;  Laterality: N/A;  left VATS  2012   thoracotomy and LUL apical posterior segmentectomy   STAPLING OF BLEBS Right 09/12/2018   Procedure: STAPLING OF BLEBS, right lower and middle lung lobes;  Surgeon: Melrose Nakayama, MD;  Location[facility];  Service: Thoracic;  Laterality: Right;   VIDEO ASSISTED THORACOSCOPY Right 09/12/2018   Procedure: VIDEO ASSISTED THORACOSCOPY, right lung;  Surgeon: Melrose Nakayama, MD;  Location: [place];  Service: Thoracic;  Laterality: Right;   VIDEO BRONCHOSCOPY Bilateral 10/20/2015   Procedure: VIDEO BRONCHOSCOPY WITHOUT FLUORO;  Surgeon: Marshell Garfinkel, MD;  Location: [place];  Service: Cardiopulmonary;  Laterality: Bilateral;   VIDEO BRONCHOSCOPY N/A 09/19/2018   Procedure: VIDEO BRONCHOSCOPY;  Surgeon: Melrose Nakayama, MD;  Location: [place];  Service: Thoracic;  Laterality: N/A;   VIDEO BRONCHOSCOPY N/A 12/01/2018   Procedure: VIDEO BRONCHOSCOPY;  Surgeon: Melrose Nakayama, MD;  Location: [place];  Service: Thoracic;  Laterality: N/A;   VIDEO BRONCHOSCOPY N/A 03/10/2022   Procedure: VIDEO BRONCHOSCOPY WITHOUT FLUORO;  Surgeon: Laurin Coder, MD;  Location: [facility];  Service: Pulmonary;  Laterality: N/A;   VIDEO BRONCHOSCOPY WITH INSERTION OF INTERBRONCHIAL VALVE (IBV) N/A 09/29/2018   Procedure: VIDEO BRONCHOSCOPY WITH INSERTION OF INTERBRONCHIAL VALVE  (IBV);  Surgeon: Melrose Nakayama, MD;  Location: [facility] OR;  Service: Thoracic;  Laterality: N/A;    Family History  Adopted: Yes       Social History   Socioeconomic History   Marital status: Single    Spouse name: Not on file   Number of children: Not on file   Years of education: Not on file   Highest education level: Not on file  Occupational History   Occupation: works 2 jobs  Tobacco Use   Smoking status: Former    Packs/day: 0.70    Years: 19.00    Additional pack years: 0.00    Total pack years: 13.30    Types: Cigarettes    Quit date: 11/20/2007    Years since quitting: 15.2   Smokeless tobacco: Former    Types: Snuff    Quit date: 11/19/1994  Vaping Use   Vaping Use: Never used  Substance and Sexual Activity   Alcohol use: No   Drug use: No   Sexual activity: Not on file  Other Topics Concern   Not on file  Social History Narrative   Pt is adopted   Patient works in [place] Strain: Not on file  Food Insecurity: Not on file  Transportation Needs: Not on file  Physical Activity: Not on file  Stress: Not on file  Social Connections: Not on file    Allergies  Allergen Reactions   Clarithromycin Other (See Comments)    Adverse reaction w/ voriconazole    Rifaximin Other (See Comments)    Adverse reaction w/ voriconazole      Current Outpatient Medications:    albuterol (PROVENTIL) (2.5 MG/3ML) 0.083% nebulizer solution, Take 3 mLs (2.5 mg total) by nebulization every 4 (four) hours as needed for wheezing or shortness of breath., Disp: 75 mL, Rfl: 12   albuterol (VENTOLIN HFA) 108 (90 Base) MCG/ACT inhaler, INHALE 2 PUFFS INTO THE LUNGS EVERY 6 HOURS AS NEEDED, Disp: 18 g, Rfl: 2   amoxicillin-clavulanate (AUGMENTIN) 875-125 MG tablet, Take 1 tablet by mouth 2 (two) times daily., Disp: 14 tablet, Rfl: 0   Benralizumab (FASENRA PEN) 30 MG/ML SOAJ, Inject 1 mL (30 mg total) into the skin  every 8 (eight) weeks., Disp: 1 mL, Rfl: 1   BREZTRI AEROSPHERE 160-9-4.8 MCG/ACT AERO, INHALE 2 PUFFS BY MOUTH TWICE DAILY, Disp: 10.7 g, Rfl: 1    Calcium Carb-Cholecalciferol 600-10 MG-MCG TABS, Take 1 tablet by mouth daily., Disp: , Rfl:    EPINEPHrine 0.3 mg/0.3 mL IJ SOAJ injection, ADMINISTER 0.3 MG IN THE MUSCLE 1 TIME FOR 1 DOSE, Disp: 2 each, Rfl: 3   ethambutol (MYAMBUTOL) 400 MG tablet, Take 2.5 tablets (1,000 mg total) by mouth daily., Disp: 225 tablet, Rfl: 3   feeding supplement, ENSURE ENLIVE, (ENSURE ENLIVE) LIQD, Take 237 mLs by mouth 3 (three) times daily between meals. (Patient taking differently: Take 237 mLs by mouth 2 (two) times daily as needed (meal supplement).), Disp: 237 mL, Rfl: 12   ferrous sulfate 325 (65 FE) MG tablet, Take 325 mg by mouth every morning., Disp: , Rfl:    folic acid (FOLVITE) 1 MG tablet, Take 1 mg by mouth every other day., Disp: , Rfl:    HYDROcodone bit-homatropine (HYDROMET) 5-1.5 MG/5ML syrup, Take 5 mLs by mouth at bedtime as needed for cough., Disp: 120 mL, Rfl: 0   Isavuconazonium Sulfate (CRESEMBA) 186 MG CAPS, TAKE 2 CAPSULES BY MOUTH DAILY, Disp: 60 capsule, Rfl: 11   Menthol-Methyl Salicylate (SALONPAS PAIN RELIEF PATCH EX), Place 1-2 patches onto the skin daily. Back pain, Disp: , Rfl:    mirtazapine (REMERON SOL-TAB) 45 MG disintegrating tablet, Take 45 mg by mouth at bedtime., Disp: , Rfl:    omalizumab (XOLAIR) 150 MG/ML prefilled syringe, INJECT 300 MG INTO THE SKIN EVERY 14 (FOURTEEN) DAYS. IN ADDITION TO 75MG  (TOTAL DOSE IS 375MG  EVERY 14 DAYS)., Disp: 4 mL, Rfl: 5   omalizumab (XOLAIR) 75 MG/0.5ML prefilled syringe, INJECT 75 MG INTO THE SKIN EVERY 14 (FOURTEEN) DAYS. IN ADDITION TO 300MG  (TOTAL DOSE IS 375MG  EVERY 14 DAYS). DELIVER TO PATIENT'S HOME., Disp: 1 mL, Rfl: 5   OXYGEN, Inhale 3 L into the lungs as needed (shortness of breath)., Disp: , Rfl:    predniSONE (DELTASONE) 10 MG tablet, Take 1 tablet (10 mg total) by mouth daily with breakfast., Disp: 90 tablet, Rfl: 3   predniSONE (DELTASONE) 10 MG tablet, 4 tabs for 2 days, then 3 tabs for 2 days, 2 tabs for 2 days, then 1  tab daily, Disp: 20 tablet, Rfl: 0   sodium chloride HYPERTONIC 3 % nebulizer solution, USE NEBULIZEER DAILY AS DIRECTED, Disp: 750 mL, Rfl: 5   TIROSINT-SOL 88 MCG/ML SOLN, Take by mouth., Disp: , Rfl:   Review of Systems     Objective:   Physical Exam        Assessment & Plan:    History of aspergilloma and urgent bronchopulmonary aspergillosis: Continue his Cresemba  M avium: Continue ethambutol and azithromycin     Mycobacterium AVM: Ampoule azithromycin.  Chronic hepatitis B without hepatic coma without delta agent:  Will check hep B DNA and a level CMP will order right upper quadrant ultrasound t          Aspergilloma and allergic bronchopulmonary aspergillosis: Continue his Cresemba.  Mycobacterium avium infection of the lungs: Continue ethambutol and azithromycin.

## 2023-02-22 ENCOUNTER — Other Ambulatory Visit: Payer: Self-pay

## 2023-02-22 ENCOUNTER — Telehealth: Payer: Self-pay | Admitting: Internal Medicine

## 2023-02-22 MED ORDER — PREDNISONE 10 MG PO TABS: 10.0000 mg | ORAL_TABLET | Freq: Every day | ORAL | 3 refills | Status: DC

## 2023-02-22 NOTE — Telephone Encounter (Signed)
Refill has been sent for prednisone. NFN

## 2023-02-22 NOTE — Telephone Encounter (Signed)
Refill of Prednisone has been sent to pharmacy. NFN

## 2023-02-22 NOTE — Telephone Encounter (Signed)
Pt called in bc he needs a  refill on his Prednisone   Pharmacy: [facility] on [address]

## 2023-03-07 ENCOUNTER — Other Ambulatory Visit (HOSPITAL_COMMUNITY): Payer: Self-pay

## 2023-03-07 ENCOUNTER — Other Ambulatory Visit: Payer: Self-pay

## 2023-03-07 DIAGNOSIS — J9611 Chronic respiratory failure with hypoxia: Secondary | ICD-10-CM | POA: Diagnosis not present

## 2023-03-08 ENCOUNTER — Emergency Department (HOSPITAL_BASED_OUTPATIENT_CLINIC_OR_DEPARTMENT_OTHER): Payer: BC Managed Care – PPO

## 2023-03-08 ENCOUNTER — Other Ambulatory Visit: Payer: Self-pay

## 2023-03-08 ENCOUNTER — Other Ambulatory Visit (HOSPITAL_COMMUNITY): Payer: Self-pay

## 2023-03-08 ENCOUNTER — Telehealth: Payer: Self-pay

## 2023-03-08 ENCOUNTER — Emergency Department (HOSPITAL_BASED_OUTPATIENT_CLINIC_OR_DEPARTMENT_OTHER)
Admission: EM | Admit: 2023-03-08 | Discharge: 2023-03-08 | Disposition: A | Discharge status: Home / Self Care | Payer: BC Managed Care – PPO | Attending: Emergency Medicine | Admitting: Emergency Medicine

## 2023-03-08 ENCOUNTER — Encounter (HOSPITAL_BASED_OUTPATIENT_CLINIC_OR_DEPARTMENT_OTHER): Payer: Self-pay

## 2023-03-08 DIAGNOSIS — R1030 Lower abdominal pain, unspecified: Secondary | ICD-10-CM | POA: Diagnosis not present

## 2023-03-08 DIAGNOSIS — K5900 Constipation, unspecified: Secondary | ICD-10-CM | POA: Diagnosis not present

## 2023-03-08 DIAGNOSIS — R3911 Hesitancy of micturition: Secondary | ICD-10-CM | POA: Diagnosis not present

## 2023-03-08 DIAGNOSIS — N281 Cyst of kidney, acquired: Secondary | ICD-10-CM | POA: Diagnosis not present

## 2023-03-08 DIAGNOSIS — R300 Dysuria: Secondary | ICD-10-CM | POA: Diagnosis not present

## 2023-03-08 DIAGNOSIS — N200 Calculus of kidney: Secondary | ICD-10-CM | POA: Diagnosis not present

## 2023-03-08 DIAGNOSIS — K8020 Calculus of gallbladder without cholecystitis without obstruction: Secondary | ICD-10-CM | POA: Diagnosis not present

## 2023-03-08 LAB — URINALYSIS, ROUTINE W REFLEX MICROSCOPIC
Bilirubin Urine: NEGATIVE
Glucose, UA: NEGATIVE mg/dL
Hgb urine dipstick: NEGATIVE
Ketones, ur: NEGATIVE mg/dL
Leukocytes,Ua: NEGATIVE
Nitrite: NEGATIVE
Protein, ur: NEGATIVE mg/dL
Specific Gravity, Urine: 1.025 (ref 1.005–1.030)
pH: 7 (ref 5.0–8.0)

## 2023-03-08 MED ORDER — POLYETHYLENE GLYCOL 3350 17 GM/SCOOP PO POWD: 1.0000 | Freq: Every day | ORAL | 0 refills | Status: AC | PRN

## 2023-03-08 NOTE — ED Provider Notes (Signed)
[place] EMERG[facility]Y DEPARTMENT AT [facility] Provider Note   CSN: [id number] Arrival date & time: 03/08/23  0900     History  Chief Complaint  Patient presents with   Urinary Retention    [name] is a 47 y.o. male presenting to the ED with difficulty with urination.  Reports symptoms on and off for about 2 days.  He says he feels he has to urinate but has difficulty urinating, and feels that there is an on and off lower abdominal burning sensation.  He says he was "drinking a lot of cranberry juice but now return to drink a lot of water".  Reports a history of UTI in the distant past.  Denies flank pain.  Denies fevers or chills.  HPI     Home Medications Prior to Admission medications   Medication Sig Start Date End Date Taking? Authorizing Provider  polyethylene glycol powder (MIRALAX) 17 GM/SCOOP powder Take 255 g by mouth daily as needed. 03/08/23  Yes Terald Sleeper, MD  albuterol (PROVENTIL) (2.5 MG/3ML) 0.083% nebulizer solution Take 3 mLs (2.5 mg total) by nebulization every 4 (four) hours as needed for wheezing or shortness of breath. 06/21/22   Kalman Shan, MD  albuterol (VENTOLIN HFA) 108 (90 Base) MCG/ACT inhaler INHALE 2 PUFFS INTO THE LUNGS EVERY 6 HOURS AS NEEDED 01/09/23   Kalman Shan, MD  amoxicillin-clavulanate (AUGMENTIN) 875-125 MG tablet Take 1 tablet by mouth 2 (two) times daily. 01/16/23   Parrett, Virgel Bouquet, NP  Benralizumab (FASENRA PEN) 30 MG/ML SOAJ Inject 1 mL (30 mg total) into the skin every 8 (eight) weeks. 11/22/22   Kalman Shan, MD  BREZTRI AEROSPHERE 160-9-4.8 MCG/ACT AERO INHALE 2 PUFFS BY MOUTH TWICE DAILY 01/14/23   Kalman Shan, MD  Calcium Carb-Cholecalciferol 600-10 MG-MCG TABS Take 1 tablet by mouth daily. 01/07/16   [provider]  EPINEPHrine 0.3 mg/0.3 mL IJ SOAJ injection ADMINISTER 0.3 MG IN THE MUSCLE 1 TIME FOR 1 DOSE 05/08/22   Kalman Shan, MD  ethambutol (MYAMBUTOL) 400 MG tablet Take 2.5  tablets (1,000 mg total) by mouth daily. 10/04/22   Randall Hiss, MD  feeding supplement, ENSURE ENLIVE, (ENSURE ENLIVE) LIQD Take 237 mLs by mouth 3 (three) times daily between meals. Patient taking differently: Take 237 mLs by mouth 2 (two) times daily as needed (meal supplement). 10/06/18   Rolly Salter, MD  ferrous sulfate 325 (65 FE) MG tablet Take 325 mg by mouth every morning.    [provider]  folic acid (FOLVITE) 1 MG tablet Take 1 mg by mouth every other day. 11/11/20   [provider]  HYDROcodone bit-homatropine (HYDROMET) 5-1.5 MG/5ML syrup Take 5 mLs by mouth at bedtime as needed for cough. 01/16/23   Parrett, Virgel Bouquet, NP  Isavuconazonium Sulfate (CRESEMBA) 186 MG CAPS TAKE 2 CAPSULES BY MOUTH DAILY 07/04/22 07/04/23  Daiva Eves, Lisette Grinder, MD  Menthol-Methyl Salicylate [facility] PAIN RELIEF PATCH EX) Place 1-2 patches onto the skin daily. Back pain    [provider]  mirtazapine (REMERON SOL-TAB) 45 MG disintegrating tablet Take 45 mg by mouth at bedtime. 11/02/21   [provider]  omalizumab [name] 150 MG/ML prefilled syringe INJECT 300 MG INTO THE SKIN EVERY 14 (FOURTEEN) DAYS. IN ADDITION TO 75MG  (TOTAL DOSE IS 375MG  EVERY 14 DAYS). 12/19/22 12/19/23  Kalman Shan, MD  omalizumab [name] 75 MG/0.5ML prefilled syringe INJECT 75 MG INTO THE SKIN EVERY 14 (FOURTEEN) DAYS. IN ADDITION TO 300MG  (  TOTAL DOSE IS  EVERY 14 DAYS). DELIVER TO PATIENT'S HOME. 12/19/22 12/19/23  Kalman Shan, MD  OXYGEN Inhale 3 L into the lungs as needed (shortness of breath).    [provider]  predniSONE (DELTASONE) 10 MG tablet 4 tabs for 2 days, then 3 tabs for 2 days, 2 tabs for 2 days, then 1 tab daily 01/16/23   Parrett, Virgel Bouquet, NP  predniSONE (DELTASONE) 10 MG tablet Take 1 tablet (10 mg total) by mouth daily with breakfast. 02/22/23   Parrett, Virgel Bouquet, NP  sodium chloride HYPERTONIC 3 % nebulizer solution USE NEBULIZEER DAILY AS  DIRECTED 05/08/22   Kalman Shan, MD  TIROSINT-SOL 88 MCG/ML SOLN Take by mouth. 06/08/22   [provider]      Allergies    Clarithromycin and Rifaximin    Review of Systems   Review of Systems  Physical Exam Updated Vital Signs BP 114/76   Pulse 81   Temp 98.1 F (36.7 C) (Oral)   Resp 18   Ht  (1.727 m)   Wt 49.9 kg   SpO2 97%   BMI 16.73 kg/m  Physical Exam Constitutional:      General: He is not in acute distress.    Comments: Thin body habitus  HENT:     Head: Normocephalic and atraumatic.  Eyes:     Conjunctiva/sclera: Conjunctivae normal.     Pupils: Pupils are equal, round, and reactive to light.  Cardiovascular:     Rate and Rhythm: Normal rate and regular rhythm.  Pulmonary:     Effort: Pulmonary effort is normal. No respiratory distress.  Abdominal:     General: There is no distension.     Tenderness: There is no abdominal tenderness.     Comments: No distended bladder on exam  Skin:    General: Skin is warm and dry.  Neurological:     General: No focal deficit present.     Mental Status: He is alert. Mental status is at baseline.  Psychiatric:        Mood and Affect: Mood normal.        Behavior: Behavior normal.     ED Results / Procedures / Treatments   Labs (all labs ordered are listed, but only abnormal results are displayed) Labs Reviewed  URINALYSIS, ROUTINE W REFLEX MICROSCOPIC    EKG None  Radiology CT Renal Stone Study  Result Date: 03/08/2023 CLINICAL DATA:  Difficulty urinating for the last 2 days. Burning sensation. EXAM: CT ABDOMEN AND PELVIS WITHOUT CONTRAST TECHNIQUE: Multidetector CT imaging of the abdomen and pelvis was performed following the standard protocol without IV contrast. RADIATION DOSE REDUCTION: This exam was performed according to the departmental dose-optimization program which includes automated exposure control, adjustment of the mA and/or kV according to patient size and/or use of iterative  reconstruction technique. COMPARISON:  Ultrasound 04/11/2022.  X-ray 03/10/2022 FINDINGS: Lower chest: Advanced emphysematous changes along the lung bases. Please correlate for history. No pleural effusion. Hepatobiliary: On this non IV contrast exam, the liver is grossly preserved. Lamellated stone in the nondilated gallbladder. Pancreas: Unremarkable. No pancreatic ductal dilatation or surrounding inflammatory changes. Spleen: Normal in size without focal abnormality. Adrenals/Urinary Tract: The adrenal glands are preserved. There is a punctate stone in each kidney is nonobstructive. Midportion right, upper pole left. No ureteral stones. Preserved contours of the urinary bladder. No collecting system dilatation. Tiny cyst along the upper pole left kidney has Hounsfield units of 15. No specific imaging follow-up. Stomach/Bowel:  Large bowel has a normal course and caliber. Diffuse colonic stool. Redundant course of the transverse colon. Stomach and small bowel are nondilated. The appendix is not clearly seen in the right lower quadrant but no pericecal stranding or fluid. Vascular/Lymphatic: Normal caliber aorta and IVC with some vascular calcifications. No specific abnormal lymph node enlargement identified in the abdomen and pelvis. Reproductive: Prostate is unremarkable. Other: No free air or free fluid. Musculoskeletal: No acute or significant osseous findings. IMPRESSION: Punctate nonobstructing bilateral renal stones.  No ureteral stones. Moderate colonic stool. Gallstone Electronically Signed   By: Karen Kayspta M.D.   On: 03/08/2023 11:02    Procedures Procedures    Medications Ordered in ED Medications - No data to display  ED Course/ Medical Decision Making/ A&P Clinical Course as of 03/08/23 1658  Fri Mar 08, 2023  1025 PVR 8 ml.  UA without clear sign of infection.  We'll check CT for ureteral stone [MT]    Clinical Course User Index [MT] Boomer Winders, Kermit Balo, MD                              Medical Decision Making Amount and/or Complexity of Data Reviewed Labs: ordered. Radiology: ordered.   Patient is here with dysuria and urinary hesitation.  Differential would include ureteral colic versus UTI versus other  External records reviewed.  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

## 2023-03-08 NOTE — Telephone Encounter (Signed)
[facility] Patient Advocate Encounter   Received notification from [facility] that prior authorization for Shelle Iron is required.   PA submitted on 03/08/23 Key [id number] Status is pending    [facility] will continue to follow.   Clearance Coots, CPhT Specialty Pharmacy Patient [facility] for Infectious Disease Phone: [phone] Fax:  [phone]

## 2023-03-08 NOTE — Discharge Instructions (Addendum)
Your workup in the ER today does not show any surgical or medical emergencies as a cause of your urinary problems.  You do appear quite constipated.  Sometimes a large amount of stool in your colon can cause pressure on your bladder.  I recommend a bowel cleanout.  You can fill the prescription for MiraLAX that was sent to your pharmacy, and take this daily as needed with a glass of water to help produce a bowel movement.  I would also recommend buying an over-the-counter laxative suppository, such as glycerin suppositories or Dulcolax suppositories, to help stimulate a bowel movement.  Continue drinking plenty of water.  Follow-up with your primary care office for this issue.

## 2023-03-08 NOTE — ED Notes (Signed)
Bladder scan 8 ml

## 2023-03-08 NOTE — ED Triage Notes (Signed)
Pt presents with complaints of difficulty urinating and decrease urinationX 2days. Pt also states burning sensation and lower abdominal discomfort.

## 2023-03-11 ENCOUNTER — Other Ambulatory Visit: Payer: Self-pay

## 2023-03-11 ENCOUNTER — Telehealth: Payer: Self-pay

## 2023-03-11 ENCOUNTER — Other Ambulatory Visit (HOSPITAL_COMMUNITY): Payer: Self-pay

## 2023-03-11 NOTE — Telephone Encounter (Signed)
[facility] Patient Advocate Encounter  Prior Authorization for Shelle Iron has been approved.    PA# [id number] Effective dates: 03/11/23 through 09/05/23  Patients co-pay is $6,163.17.   Patient have  a copay card to make $0.00  [facility] will continue to follow.  Clearance Coots, CPhT Specialty Pharmacy Patient [facility] for Infectious Disease Phone: [phone] Fax:  [phone]

## 2023-03-12 ENCOUNTER — Other Ambulatory Visit: Payer: Self-pay | Admitting: Internal Medicine

## 2023-03-13 ENCOUNTER — Other Ambulatory Visit: Payer: Self-pay

## 2023-03-13 ENCOUNTER — Other Ambulatory Visit (HOSPITAL_COMMUNITY): Payer: Self-pay

## 2023-03-15 ENCOUNTER — Other Ambulatory Visit (HOSPITAL_COMMUNITY): Payer: Self-pay

## 2023-03-19 ENCOUNTER — Other Ambulatory Visit (HOSPITAL_COMMUNITY): Payer: Self-pay

## 2023-03-20 ENCOUNTER — Ambulatory Visit: Payer: BC Managed Care – PPO | Admitting: Infectious Disease

## 2023-03-20 ENCOUNTER — Other Ambulatory Visit: Payer: Self-pay

## 2023-03-21 ENCOUNTER — Other Ambulatory Visit: Payer: Self-pay

## 2023-03-21 ENCOUNTER — Encounter (HOSPITAL_BASED_OUTPATIENT_CLINIC_OR_DEPARTMENT_OTHER): Payer: Self-pay | Admitting: Emergency Medicine

## 2023-03-21 ENCOUNTER — Emergency Department (HOSPITAL_BASED_OUTPATIENT_CLINIC_OR_DEPARTMENT_OTHER)
Admission: EM | Admit: 2023-03-21 | Discharge: 2023-03-21 | Disposition: A | Discharge status: Home / Self Care | Payer: BC Managed Care – PPO | Attending: Emergency Medicine | Admitting: Emergency Medicine

## 2023-03-21 DIAGNOSIS — Z87891 Personal history of nicotine dependence: Secondary | ICD-10-CM | POA: Diagnosis not present

## 2023-03-21 DIAGNOSIS — R300 Dysuria: Secondary | ICD-10-CM | POA: Insufficient documentation

## 2023-03-21 DIAGNOSIS — E039 Hypothyroidism, unspecified: Secondary | ICD-10-CM | POA: Insufficient documentation

## 2023-03-21 LAB — URINALYSIS, MICROSCOPIC (REFLEX)

## 2023-03-21 LAB — URINALYSIS, ROUTINE W REFLEX MICROSCOPIC
Bilirubin Urine: NEGATIVE
Glucose, UA: NEGATIVE mg/dL
Ketones, ur: NEGATIVE mg/dL
Leukocytes,Ua: NEGATIVE
Nitrite: NEGATIVE
Protein, ur: NEGATIVE mg/dL
Specific Gravity, Urine: 1.02 (ref 1.005–1.030)
pH: 7 (ref 5.0–8.0)

## 2023-03-21 NOTE — ED Triage Notes (Signed)
Pt states was seen 2 weeks ago with dysuria and constipation, but no cause found. States has been having normal bowel movents until last 2 days now has urgency, burning and little to no urine output. States wakes up with urge to go but no output.

## 2023-03-21 NOTE — ED Provider Notes (Signed)
[facility] Emergency Department Provider Note MRN:  [id number]  Arrival date & time: 03/21/23     Chief Complaint   Dysuria   History of Present Illness   [name] is a 47 y.o. year-old male with a history of hepatitis B presenting to the ED with chief complaint of dysuria.  Burning with urination and also straining to urinate for the past few weeks.  Review of Systems  A thorough review of systems was obtained and all systems are negative except as noted in the HPI and PMH.   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   Lung disease, bullous (HCC)    MAI (mycobacterium avium-intracellulare) (HCC)    Mold exposure 12/05/2015   Photosensitivity dermatitis 09/16/2017   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/14/2022   Rib fracture 02/08/2021   Solar lentigo 08/08/2015   Tension pneumothorax 09/04/2018   Vaccine counseling 12/28/2020   Weight loss 10/15/2016    Past Surgical History:  Procedure Laterality Date   BRONCHIAL WASHINGS  03/10/2022   Procedure: BRONCHIAL WASHINGS;  Surgeon: Tomma Lightning, MD;  Location: [facility];  Service: Pulmonary;;   CHEST TUBE INSERTION Right 09/29/2018   Procedure: CHEST TUBE REPLACEMENT;  Surgeon: Loreli Slot, MD;  Location: [facility] OR;  Service: Thoracic;  Laterality: Right;   INSERTION OF IBV VALVE N/A 09/19/2018   Procedure: INSERTION OF INTERBRONCHIAL VALVE (IBV);  Surgeon: Loreli Slot, MD;  Location: [facility] OR;  Service: Thoracic;  Laterality: N/A;   INSERTION OF IBV VALVE N/A 12/01/2018   Procedure: REMOVAL OF  INTERBRONCHIAL VALVE (IBV);  Surgeon: Loreli Slot, MD;  Location: [facility] OR;  Service: Thoracic;  Laterality: N/A;   left VATS  2012   thoracotomy and LUL apical posterior segmentectomy   STAPLING OF BLEBS Right 09/12/2018   Procedure: STAPLING OF BLEBS, right lower and middle lung lobes;  Surgeon: Loreli Slot, MD;  Location: [facility] OR;  Service: Thoracic;  Laterality: Right;   VIDEO ASSISTED THORACOSCOPY Right 09/12/2018   Procedure: VIDEO ASSISTED THORACOSCOPY, right lung;  Surgeon: Loreli Slot, MD;  Location: [facility] OR;  Service: Thoracic;  Laterality: Right;   VIDEO BRONCHOSCOPY Bilateral 10/20/2015   Procedure: VIDEO BRONCHOSCOPY WITHOUT FLUORO;  Surgeon: Chilton Greathouse, MD;  Location: [facility];  Service: Cardiopulmonary;  Laterality: Bilateral;   VIDEO BRONCHOSCOPY N/A 09/19/2018   Procedure: VIDEO BRONCHOSCOPY;  Surgeon: Loreli Slot, MD;  Location[facility] OR;  Service: Thoracic;  Laterality: N/A;   VIDEO BRONCHOSCOPY N/A 12/01/2018   Procedure: VIDEO BRONCHOSCOPY;  Surgeon: Loreli Slot, MD;  Location: [facility] OR;  Service: Thoracic;  Laterality: N/A;   VIDEO BRONCHOSCOPY N/A 03/10/2022   Procedure: VIDEO BRONCHOSCOPY WITHOUT FLUORO;  Surgeon: Tomma Lightning, MD;  Location: [facility];  Service: Pulmonary;  Laterality: N/A;   VIDEO BRONCHOSCOPY WITH INSERTION OF INTERBRONCHIAL VALVE (IBV) N/A 09/29/2018   Procedure: VIDEO BRONCHOSCOPY WITH INSERTION OF INTERBRONCHIAL VALVE  (IBV);  Surgeon: Loreli Slot, MD;  Location: [facility] OR;  Service: Thoracic;  Laterality: N/A;  Family History  Adopted: Yes    Social History   Socioeconomic History   Marital status: Single    Spouse name: Not on file   Number of children: Not on file   Years of education: Not on file   Highest education level: Not on file  Occupational History   Occupation: works 2 jobs  Tobacco Use   Smoking status: Former    Packs/day: 0.70    Years: 19.00    Additional pack  years: 0.00    Total pack years: 13.30    Types: Cigarettes    Quit date: 11/20/2007    Years since quitting: 15.3   Smokeless tobacco: Former    Types: Snuff    Quit date: 11/19/1994  Vaping Use   Vaping Use: Never used  Substance and Sexual Activity   Alcohol use: No   Drug use: No   Sexual activity: Not on file  Other Topics Concern   Not on file  Social History Narrative   Pt is adopted   Patient works in Public house manager Determinants of Corporate investment banker Strain: Not on file  Food Insecurity: Not on file  Transportation Needs: Not on file  Physical Activity: Not on file  Stress: Not on file  Social Connections: Not on file  Intimate Partner Violence: Not on file     Physical Exam   Vitals:   03/21/23 0514  BP: 101/79  Pulse: 96  Resp: (!) 28  Temp: 98.6 F (37 C)  SpO2: 97%    CONSTITUTIONAL: Chronically ill-appearing, NAD NEURO/PSYCH:  Alert and oriented x 3, no focal deficits EYES:  eyes equal and reactive ENT/NECK:  no LAD, no JVD CARDIO: Regular rate, well-perfused, normal S1 and S2 PULM:  CTAB no wheezing or rhonchi GI/GU:  non-distended, non-tender MSK/SPINE:  No gross deformities, no edema SKIN:  no rash, atraumatic   *Additional and/or pertinent findings included in MDM below  Diagnostic and Interventional Summary    EKG Interpretation  Date/Time:    Ventricular Rate:    PR Interval:    QRS Duration:   QT Interval:    QTC Calculation:   R Axis:     Text Interpretation:         Labs Reviewed  URINALYSIS, ROUTINE W REFLEX MICROSCOPIC - Abnormal; Notable for the following components:      Result Value   Hgb urine dipstick MODERATE (*)    All other components within normal limits  URINALYSIS, MICROSCOPIC (REFLEX) - Abnormal; Notable for the following components:   Bacteria, UA RARE (*)    All other components within normal limits    No orders to display    Medications - No data to display   Procedures  /  Critical  Care Procedures  ED Course and Medical Decision Making  Initial Impression and Ddx Suspect BPH with straining.  UTI also considered given the burning recently.  No tenderness to the abdomen, normal vital signs, suspect would benefit from urology follow-up.  Past medical/surgical history that increases complexity of ED encounter: Hepatitis B  Interpretation of Diagnostics I personally reviewed the urinalysis and my interpretation is as follows: Some red blood cells but no obvious signs of infection  Bladder scan with 150  Patient Reassessment and Ultimate Disposition/Management     Patient able to produce urine here in the emergency department, no emergent process, appropriate for discharge.  Patient management required discussion with the following services or consulting groups:  None  Complexity of Problems Addressed Acute complicated illness or Injury  Additional Data Reviewed and Analyzed Further history obtained from: None  Additional Factors Impacting ED Encounter Risk None  Elmer Sow. Pilar Plate, MD [facility] Emergency Medicine [facility] [email or web address]  Final Clinical Impressions(s) / ED Diagnoses     ICD-10-CM   1. Dysuria  R30.0       ED Discharge Orders     None        Discharge Instructions Discussed with and Provided to Patient:     Discharge Instructions      You were evaluated in the Emergency Department and after careful evaluation, we did not find any emergent condition requiring admission or further testing in the hospital.  Your exam/testing today was overall reassuring.  Urine sample without signs of infection.  Suspect your symptoms are related to an enlarged prostate.  Recommend calling the urology office to set up an appointment.  Please return to the Emergency Department if you experience any worsening of your condition.  Thank you for allowing [place] to be a part of your care.        Sabas Sous, MD 03/21/23  [phone]

## 2023-03-21 NOTE — Discharge Instructions (Signed)
You were evaluated in the Emergency Department and after careful evaluation, we did not find any emergent condition requiring admission or further testing in the hospital.  Your exam/testing today was overall reassuring.  Urine sample without signs of infection.  Suspect your symptoms are related to an enlarged prostate.  Recommend calling the urology office to set up an appointment.  Please return to the Emergency Department if you experience any worsening of your condition.  Thank you for allowing [place] to be a part of your care.

## 2023-03-22 DIAGNOSIS — R3912 Poor urinary stream: Secondary | ICD-10-CM | POA: Diagnosis not present

## 2023-03-22 DIAGNOSIS — R300 Dysuria: Secondary | ICD-10-CM | POA: Diagnosis not present

## 2023-03-22 DIAGNOSIS — Z125 Encounter for screening for malignant neoplasm of prostate: Secondary | ICD-10-CM | POA: Diagnosis not present

## 2023-03-22 DIAGNOSIS — R3121 Asymptomatic microscopic hematuria: Secondary | ICD-10-CM | POA: Diagnosis not present

## 2023-03-22 DIAGNOSIS — N401 Enlarged prostate with lower urinary tract symptoms: Secondary | ICD-10-CM | POA: Diagnosis not present

## 2023-03-27 DIAGNOSIS — E039 Hypothyroidism, unspecified: Secondary | ICD-10-CM | POA: Diagnosis not present

## 2023-03-27 DIAGNOSIS — R630 Anorexia: Secondary | ICD-10-CM | POA: Diagnosis not present

## 2023-03-27 DIAGNOSIS — R634 Abnormal weight loss: Secondary | ICD-10-CM | POA: Diagnosis not present

## 2023-04-06 DIAGNOSIS — J9611 Chronic respiratory failure with hypoxia: Secondary | ICD-10-CM | POA: Diagnosis not present

## 2023-04-08 ENCOUNTER — Other Ambulatory Visit: Payer: Self-pay | Admitting: Internal Medicine

## 2023-04-09 ENCOUNTER — Other Ambulatory Visit (HOSPITAL_COMMUNITY): Payer: Self-pay

## 2023-04-09 ENCOUNTER — Other Ambulatory Visit: Payer: Self-pay

## 2023-04-09 DIAGNOSIS — R200 Anesthesia of skin: Secondary | ICD-10-CM | POA: Diagnosis not present

## 2023-04-09 DIAGNOSIS — R202 Paresthesia of skin: Secondary | ICD-10-CM | POA: Diagnosis not present

## 2023-04-10 ENCOUNTER — Other Ambulatory Visit: Payer: Self-pay

## 2023-04-14 ENCOUNTER — Other Ambulatory Visit: Payer: Self-pay | Admitting: Internal Medicine

## 2023-04-17 ENCOUNTER — Emergency Department (HOSPITAL_BASED_OUTPATIENT_CLINIC_OR_DEPARTMENT_OTHER): Payer: BC Managed Care – PPO

## 2023-04-17 ENCOUNTER — Emergency Department (HOSPITAL_BASED_OUTPATIENT_CLINIC_OR_DEPARTMENT_OTHER)
Admission: EM | Admit: 2023-04-17 | Discharge: 2023-04-18 | Disposition: A | Discharge status: Home / Self Care | Payer: BC Managed Care – PPO | Attending: Emergency Medicine | Admitting: Emergency Medicine

## 2023-04-17 ENCOUNTER — Other Ambulatory Visit: Payer: Self-pay

## 2023-04-17 ENCOUNTER — Encounter (HOSPITAL_BASED_OUTPATIENT_CLINIC_OR_DEPARTMENT_OTHER): Payer: Self-pay

## 2023-04-17 DIAGNOSIS — R062 Wheezing: Secondary | ICD-10-CM | POA: Insufficient documentation

## 2023-04-17 DIAGNOSIS — M79662 Pain in left lower leg: Secondary | ICD-10-CM | POA: Diagnosis not present

## 2023-04-17 DIAGNOSIS — M79661 Pain in right lower leg: Secondary | ICD-10-CM | POA: Diagnosis not present

## 2023-04-17 DIAGNOSIS — S81811A Laceration without foreign body, right lower leg, initial encounter: Secondary | ICD-10-CM | POA: Insufficient documentation

## 2023-04-17 DIAGNOSIS — Y92410 Unspecified street and highway as the place of occurrence of the external cause: Secondary | ICD-10-CM | POA: Insufficient documentation

## 2023-04-17 DIAGNOSIS — R072 Precordial pain: Secondary | ICD-10-CM | POA: Diagnosis not present

## 2023-04-17 DIAGNOSIS — R0789 Other chest pain: Secondary | ICD-10-CM | POA: Diagnosis not present

## 2023-04-17 DIAGNOSIS — R079 Chest pain, unspecified: Secondary | ICD-10-CM | POA: Diagnosis not present

## 2023-04-17 MED ORDER — OXYCODONE HCL 5 MG PO TABS
5.0000 mg | ORAL_TABLET | Freq: Once | ORAL | Status: AC
  Administered 2023-04-17: 5 mg via ORAL
  Filled 2023-04-17: qty 1

## 2023-04-17 MED ORDER — OXYCODONE HCL 5 MG PO TABS: 5.0000 mg | ORAL_TABLET | ORAL | 0 refills | Status: DC | PRN

## 2023-04-17 NOTE — ED Notes (Signed)
Called [facility] for transport home due to O2.   Called @8 :52pm

## 2023-04-17 NOTE — ED Notes (Addendum)
Mepitel placed over lg skin tear to rt. Leg.  Covered with a dry gauze and wrapped with kerlix.   Pt understands to monitor for any s/s of infection and that may stay in place up to 2 weeks Pt is pending dc, [facility] called for transport because pt can not go without oxygen

## 2023-04-17 NOTE — ED Notes (Signed)
Still awaiting [facility] vss, pain pill given
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
## 2023-04-18 NOTE — ED Notes (Signed)
Ptar cancelled pt has a friend that is coming with his portable oxygen tank to pick him up

## 2023-04-18 NOTE — ED Notes (Signed)
Pt called out states has a ride with O2 coming to pick him up and requested to cancel transport.

## 2023-04-19 ENCOUNTER — Other Ambulatory Visit (HOSPITAL_COMMUNITY): Payer: Self-pay

## 2023-05-06 DIAGNOSIS — R42 Dizziness and giddiness: Secondary | ICD-10-CM | POA: Diagnosis not present

## 2023-05-07 DIAGNOSIS — J9611 Chronic respiratory failure with hypoxia: Secondary | ICD-10-CM | POA: Diagnosis not present

## 2023-05-08 ENCOUNTER — Other Ambulatory Visit: Payer: Self-pay

## 2023-05-08 ENCOUNTER — Other Ambulatory Visit (HOSPITAL_COMMUNITY): Payer: Self-pay

## 2023-05-08 ENCOUNTER — Other Ambulatory Visit: Payer: Self-pay | Admitting: Internal Medicine

## 2023-05-08 DIAGNOSIS — J4550 Severe persistent asthma, uncomplicated: Secondary | ICD-10-CM

## 2023-05-08 DIAGNOSIS — R42 Dizziness and giddiness: Secondary | ICD-10-CM | POA: Diagnosis not present

## 2023-05-08 MED ORDER — FASENRA PEN 30 MG/ML ~~LOC~~ SOAJ
30.0000 mg | SUBCUTANEOUS | 1 refills | Status: DC
Start: 2023-05-08 — End: 2023-08-22
  Filled 2023-05-08: qty 1, 56d supply, fill #0
  Filled 2023-06-27: qty 1, 56d supply, fill #1

## 2023-05-10 ENCOUNTER — Other Ambulatory Visit (HOSPITAL_COMMUNITY): Payer: Self-pay

## 2023-05-13 ENCOUNTER — Other Ambulatory Visit: Payer: Self-pay

## 2023-05-13 ENCOUNTER — Other Ambulatory Visit (HOSPITAL_COMMUNITY): Payer: Self-pay

## 2023-05-14 ENCOUNTER — Other Ambulatory Visit: Payer: Self-pay

## 2023-05-15 ENCOUNTER — Other Ambulatory Visit: Payer: Self-pay

## 2023-05-21 ENCOUNTER — Ambulatory Visit: Payer: BC Managed Care – PPO | Admitting: Infectious Disease

## 2023-05-22 ENCOUNTER — Other Ambulatory Visit: Payer: Self-pay

## 2023-06-03 ENCOUNTER — Other Ambulatory Visit: Payer: Self-pay

## 2023-06-03 ENCOUNTER — Other Ambulatory Visit (HOSPITAL_COMMUNITY): Payer: Self-pay

## 2023-06-03 ENCOUNTER — Other Ambulatory Visit: Payer: Self-pay | Admitting: Internal Medicine

## 2023-06-03 DIAGNOSIS — J4550 Severe persistent asthma, uncomplicated: Secondary | ICD-10-CM

## 2023-06-03 MED ORDER — OMALIZUMAB 75 MG/0.5ML ~~LOC~~ SOSY
PREFILLED_SYRINGE | SUBCUTANEOUS | 5 refills | Status: DC
Start: 2023-06-03 — End: 2023-11-14
  Filled 2023-06-03: qty 1, 28d supply, fill #0
  Filled 2023-06-27: qty 1, 28d supply, fill #1
  Filled 2023-07-31: qty 1, 28d supply, fill #2
  Filled 2023-08-27: qty 1, 28d supply, fill #3
  Filled 2023-09-18: qty 1, 28d supply, fill #4
  Filled 2023-10-15: qty 1, 28d supply, fill #5

## 2023-06-03 MED ORDER — OMALIZUMAB 150 MG/ML ~~LOC~~ SOSY
PREFILLED_SYRINGE | SUBCUTANEOUS | 5 refills | Status: DC
Start: 2023-06-03 — End: 2023-11-14
  Filled 2023-06-03: qty 4, 28d supply, fill #0
  Filled 2023-06-27: qty 4, 28d supply, fill #1
  Filled 2023-07-31: qty 4, 28d supply, fill #2
  Filled 2023-08-27: qty 4, 28d supply, fill #3
  Filled 2023-09-18: qty 4, 28d supply, fill #4
  Filled 2023-10-15: qty 4, 28d supply, fill #5

## 2023-06-03 NOTE — Telephone Encounter (Signed)
Refill sent for [facility] to [facility]: [phone]   Dose: 375 mg SQ every 2 weeks  Last OV: 01/16/23 Provider: Dr. Marchelle Gearing  Next OV: 06/13/23  Chesley Mires, PharmD, MPH, BCPS Clinical Pharmacist (Rheumatology and Pulmonology)

## 2023-06-04 ENCOUNTER — Other Ambulatory Visit: Payer: Self-pay | Admitting: Internal Medicine

## 2023-06-05 ENCOUNTER — Other Ambulatory Visit (HOSPITAL_COMMUNITY): Payer: Self-pay

## 2023-06-06 ENCOUNTER — Other Ambulatory Visit: Payer: Self-pay

## 2023-06-06 DIAGNOSIS — J9611 Chronic respiratory failure with hypoxia: Secondary | ICD-10-CM | POA: Diagnosis not present

## 2023-06-10 ENCOUNTER — Other Ambulatory Visit (HOSPITAL_COMMUNITY): Payer: Self-pay

## 2023-06-12 DIAGNOSIS — E039 Hypothyroidism, unspecified: Secondary | ICD-10-CM | POA: Diagnosis not present

## 2023-06-13 ENCOUNTER — Ambulatory Visit: Payer: BC Managed Care – PPO | Admitting: Internal Medicine

## 2023-06-27 ENCOUNTER — Other Ambulatory Visit (HOSPITAL_COMMUNITY): Payer: Self-pay

## 2023-07-02 ENCOUNTER — Other Ambulatory Visit: Payer: Self-pay

## 2023-07-07 DIAGNOSIS — J9611 Chronic respiratory failure with hypoxia: Secondary | ICD-10-CM | POA: Diagnosis not present

## 2023-07-12 ENCOUNTER — Other Ambulatory Visit: Payer: Self-pay | Admitting: Internal Medicine

## 2023-07-15 ENCOUNTER — Other Ambulatory Visit: Payer: Self-pay | Admitting: Infectious Disease

## 2023-07-15 ENCOUNTER — Ambulatory Visit (INDEPENDENT_AMBULATORY_CARE_PROVIDER_SITE_OTHER): Payer: BC Managed Care – PPO | Admitting: Adult Health

## 2023-07-15 ENCOUNTER — Other Ambulatory Visit: Payer: Self-pay

## 2023-07-15 ENCOUNTER — Encounter: Payer: Self-pay | Admitting: Adult Health

## 2023-07-15 ENCOUNTER — Other Ambulatory Visit (HOSPITAL_COMMUNITY): Payer: Self-pay

## 2023-07-15 VITALS — BP 106/66 | HR 88 | Temp 98.3°F | Ht 69.0 in | Wt 102.0 lb

## 2023-07-15 DIAGNOSIS — B181 Chronic viral hepatitis B without delta-agent: Secondary | ICD-10-CM

## 2023-07-15 DIAGNOSIS — J479 Bronchiectasis, uncomplicated: Secondary | ICD-10-CM | POA: Diagnosis not present

## 2023-07-15 DIAGNOSIS — R634 Abnormal weight loss: Secondary | ICD-10-CM

## 2023-07-15 DIAGNOSIS — B440 Invasive pulmonary aspergillosis: Secondary | ICD-10-CM

## 2023-07-15 DIAGNOSIS — E43 Unspecified severe protein-calorie malnutrition: Secondary | ICD-10-CM

## 2023-07-15 DIAGNOSIS — R636 Underweight: Secondary | ICD-10-CM

## 2023-07-15 DIAGNOSIS — B4481 Allergic bronchopulmonary aspergillosis: Secondary | ICD-10-CM

## 2023-07-15 DIAGNOSIS — J4550 Severe persistent asthma, uncomplicated: Secondary | ICD-10-CM

## 2023-07-15 DIAGNOSIS — M8000XA Age-related osteoporosis with current pathological fracture, unspecified site, initial encounter for fracture: Secondary | ICD-10-CM

## 2023-07-15 DIAGNOSIS — B449 Aspergillosis, unspecified: Secondary | ICD-10-CM

## 2023-07-15 DIAGNOSIS — J9611 Chronic respiratory failure with hypoxia: Secondary | ICD-10-CM

## 2023-07-15 DIAGNOSIS — A310 Pulmonary mycobacterial infection: Secondary | ICD-10-CM

## 2023-07-15 MED ORDER — CRESEMBA 186 MG PO CAPS
2.0000 | ORAL_CAPSULE | Freq: Every day | ORAL | 0 refills | Status: DC
Start: 2023-07-15 — End: 2023-08-13
  Filled 2023-07-15: qty 56, 28d supply, fill #0
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
## 2023-07-15 NOTE — Patient Instructions (Addendum)
Continue on BREZTRI 2 puffs Twice daily  , rinse after use.  Prednisone10mg  daily  Flutter valve Three times a day  .  Please eat often,  High protein food and drinks. -try high calorie boost.  Continue on Fasenra and Xolair .  Continue with ID follow up.  Continue on Oxygen 2l/m with activity and At bedtime, to keep O2 sats >88-90%. -Please wear with activity .  Albuterol inhaler As needed  Wheezing/shortness of breath .  Albuterol neb every 6hrs as needed for wheezing /shortness of breath as needed.  Hypertonic nebs 1-2 twice daily  Zyrtec 10mg  At bedtime  .  Robitussin DM As needed  Cough/congestion  Set up for HRCT chest  Refer to dietician.  Follow up with Dr. Marchelle Gearing or Johann Gascoigne NP in 6 weeks with PFT -Spirometry with DLCO and As needed   Please contact office for sooner follow up if symptoms do not improve or worsen or seek emergency care

## 2023-07-16 ENCOUNTER — Other Ambulatory Visit: Payer: Self-pay

## 2023-07-17 ENCOUNTER — Telehealth: Payer: Self-pay | Admitting: Adult Health

## 2023-07-17 NOTE — Telephone Encounter (Signed)
[facility]  in [place]. [address] [place] will not refill his Breztri. His # is [phone]

## 2023-07-18 NOTE — Assessment & Plan Note (Signed)
Continue on oxygen 2 L with activity to maintain O2 saturations greater than 88 to 90%

## 2023-07-18 NOTE — Assessment & Plan Note (Addendum)
With mucociliary clearance with flutter valve and hypertonic nebs.  Check PFTs on return.  Check high-resolution CT chest

## 2023-07-18 NOTE — Assessment & Plan Note (Signed)
Continue follow-up with infectious disease and current maintenance regimen 

## 2023-07-18 NOTE — Assessment & Plan Note (Signed)
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  Albuterol inhaler and nebulizer as needed.  Continue on Fasenra and Xolair.  Check spirometry with [facility] on return visit  Plan Patient Instructions  Continue on BREZTRI 2 puffs Twice daily  , rinse after use.  Prednisone10mg  daily  Flutter valve Three times a day  .  Please eat often,  High protein food and drinks. -try high calorie boost.  Continue on Fasenra and Xolair .  Continue with ID follow up.  Continue on Oxygen 2l/m with activity and At bedtime, to keep O2 sats >88-90%. -Please wear with activity .  Albuterol inhaler As needed  Wheezing/shortness of breath .  Albuterol neb every 6hrs as needed for wheezing /shortness of breath as needed.  Hypertonic nebs 1-2 twice daily  Zyrtec 10mg  At bedtime  .  Robitussin DM As needed  Cough/congestion  Set up for HRCT chest  Refer to dietician.  Follow up with Dr. Marchelle Gearing or Burwell Bethel NP in 6 weeks with PFT -Spirometry with DLCO and As needed   Please contact office for sooner follow up if symptoms do not improve or worsen or seek emergency care

## 2023-07-18 NOTE — Assessment & Plan Note (Signed)
Continue on current regimen along with Xolair and Fasenra and chronic prednisone at 10 mg daily

## 2023-07-18 NOTE — Assessment & Plan Note (Signed)
Severe protein calorie malnutrition with further weight loss.  Long discussion with patient regarding high-protein high-calorie diet.  Will refer to dietitian for further assistance.

## 2023-07-18 NOTE — Assessment & Plan Note (Signed)
Continue on current maintenance regimen with antifungal therapy followed by infectious disease

## 2023-07-19 ENCOUNTER — Other Ambulatory Visit (HOSPITAL_COMMUNITY): Payer: Self-pay

## 2023-07-19 NOTE — Telephone Encounter (Signed)
Test claim shows that medication is covered with a $0.00 co-pay. No PA required

## 2023-07-19 NOTE — Telephone Encounter (Signed)
Breztri rx was sent to pharmacy.  Informed patient.

## 2023-07-23 ENCOUNTER — Telehealth: Payer: Self-pay | Admitting: Adult Health

## 2023-07-23 ENCOUNTER — Other Ambulatory Visit: Payer: Self-pay | Admitting: *Deleted

## 2023-07-23 MED ORDER — BREZTRI AEROSPHERE 160-9-4.8 MCG/ACT IN AERO: 2.0000 | INHALATION_SPRAY | Freq: Two times a day (BID) | RESPIRATORY_TRACT | 5 refills | Status: DC

## 2023-07-23 MED ORDER — DOXYCYCLINE HYCLATE 100 MG PO TABS: 100.0000 mg | ORAL_TABLET | Freq: Two times a day (BID) | ORAL | 0 refills | Status: DC

## 2023-07-23 NOTE — Telephone Encounter (Signed)
Pharmacy states they have not received the prescription

## 2023-07-23 NOTE — Telephone Encounter (Signed)
Called and spoke with patient, he states he is doing a lot of coughing.  Coughing up a lot of thick yellow mucous.  Starting to get chills.  Temperature has been 99-101.  Takes something and it comes back down.  This all started the day after he saw you in the office on 07/15/2023.  He says he feels the same way he felt earlier this year when he had pna.  Oxygen level drops to 85% or below without oxygen anytime he is active.  He is requesting an antibiotic be sent to his pharmacy, [facility] in [place].  He was last on Augmentin in February on 2024.  Tammy, please advise.  Thank you.

## 2023-07-23 NOTE — Telephone Encounter (Signed)
Doxycycline Twice daily  for 10 days , #20  Use sunscreen   Please contact office for sooner follow up if symptoms do not improve or worsen or seek emergency care

## 2023-07-23 NOTE — Telephone Encounter (Signed)
Patient is calling because he is sick. He feels that he has pneumonia, fever off and on, keeps coughing up mucus,shortness of breath.He would like something to be called in.

## 2023-07-23 NOTE — Telephone Encounter (Signed)
Called and spoke with patient, advised of recommendations per Rubye Oaks NP.  He verbalized understanding.  Verified pharmacy and script sent.  Nothing further needed.

## 2023-07-26 ENCOUNTER — Other Ambulatory Visit (HOSPITAL_COMMUNITY): Payer: Self-pay

## 2023-07-29 ENCOUNTER — Other Ambulatory Visit: Payer: BC Managed Care – PPO

## 2023-07-31 ENCOUNTER — Other Ambulatory Visit (HOSPITAL_COMMUNITY): Payer: Self-pay

## 2023-08-01 ENCOUNTER — Other Ambulatory Visit (HOSPITAL_COMMUNITY): Payer: Self-pay

## 2023-08-05 ENCOUNTER — Telehealth: Payer: Self-pay

## 2023-08-05 ENCOUNTER — Other Ambulatory Visit: Payer: Self-pay

## 2023-08-05 MED ORDER — AZITHROMYCIN 500 MG PO TABS: 500.0000 mg | ORAL_TABLET | Freq: Every day | ORAL | 2 refills | Status: DC

## 2023-08-05 NOTE — Telephone Encounter (Signed)
Pt called back regarding text received from [facility]. States that text mentioned that rx was denied. Informed patient that this was sent this morning and should call pharmacy.  Did speak with pharmacy tech who states prescription has been received and filled. Juanita Laster, RMA

## 2023-08-05 NOTE — Telephone Encounter (Signed)
Patient called requesting refill on Azithromycin. Patient stated he has been out of abx since last week. Patient scheduled for follow up on 9/23. Refills need to be sent to [facility] on [address] in [place].    [place] Dube Lesli Albee, CMA

## 2023-08-07 DIAGNOSIS — J9611 Chronic respiratory failure with hypoxia: Secondary | ICD-10-CM | POA: Diagnosis not present

## 2023-08-08 ENCOUNTER — Other Ambulatory Visit: Payer: Self-pay

## 2023-08-08 ENCOUNTER — Other Ambulatory Visit: Payer: Self-pay | Admitting: Infectious Disease

## 2023-08-08 ENCOUNTER — Other Ambulatory Visit (HOSPITAL_COMMUNITY): Payer: Self-pay

## 2023-08-08 DIAGNOSIS — B449 Aspergillosis, unspecified: Secondary | ICD-10-CM

## 2023-08-08 DIAGNOSIS — B181 Chronic viral hepatitis B without delta-agent: Secondary | ICD-10-CM

## 2023-08-08 DIAGNOSIS — A310 Pulmonary mycobacterial infection: Secondary | ICD-10-CM

## 2023-08-08 MED ORDER — ETHAMBUTOL HCL 400 MG PO TABS
1000.0000 mg | ORAL_TABLET | Freq: Every day | ORAL | 1 refills | Status: DC
Start: 2023-08-08 — End: 2023-08-14

## 2023-08-09 NOTE — Telephone Encounter (Signed)
Dispensed 8/27, patient has appointment 9/23.  Sandie Ano, RN

## 2023-08-12 ENCOUNTER — Ambulatory Visit: Payer: BC Managed Care – PPO | Admitting: Infectious Disease

## 2023-08-12 ENCOUNTER — Other Ambulatory Visit: Payer: Self-pay

## 2023-08-13 ENCOUNTER — Ambulatory Visit
Admission: RE | Admit: 2023-08-13 | Discharge: 2023-08-13 | Disposition: A | Discharge status: Home / Self Care | Payer: BC Managed Care – PPO | Source: Ambulatory Visit | Attending: Adult Health | Admitting: Adult Health

## 2023-08-13 DIAGNOSIS — J45909 Unspecified asthma, uncomplicated: Secondary | ICD-10-CM | POA: Diagnosis not present

## 2023-08-13 DIAGNOSIS — J4550 Severe persistent asthma, uncomplicated: Secondary | ICD-10-CM

## 2023-08-13 DIAGNOSIS — I700 Atherosclerosis of aorta: Secondary | ICD-10-CM | POA: Diagnosis not present

## 2023-08-13 DIAGNOSIS — J479 Bronchiectasis, uncomplicated: Secondary | ICD-10-CM | POA: Diagnosis not present
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
## 2023-08-14 ENCOUNTER — Other Ambulatory Visit (HOSPITAL_COMMUNITY): Payer: Self-pay

## 2023-08-14 ENCOUNTER — Ambulatory Visit (INDEPENDENT_AMBULATORY_CARE_PROVIDER_SITE_OTHER): Payer: BC Managed Care – PPO | Admitting: Infectious Disease

## 2023-08-14 ENCOUNTER — Encounter: Payer: Self-pay | Admitting: Infectious Disease

## 2023-08-14 ENCOUNTER — Other Ambulatory Visit: Payer: Self-pay

## 2023-08-14 ENCOUNTER — Telehealth: Payer: Self-pay

## 2023-08-14 VITALS — BP 105/79 | HR 82 | Resp 16 | Ht 69.0 in | Wt 96.8 lb

## 2023-08-14 DIAGNOSIS — B181 Chronic viral hepatitis B without delta-agent: Secondary | ICD-10-CM | POA: Diagnosis not present

## 2023-08-14 DIAGNOSIS — Z23 Encounter for immunization: Secondary | ICD-10-CM

## 2023-08-14 DIAGNOSIS — A310 Pulmonary mycobacterial infection: Secondary | ICD-10-CM | POA: Diagnosis not present

## 2023-08-14 DIAGNOSIS — J439 Emphysema, unspecified: Secondary | ICD-10-CM | POA: Diagnosis not present

## 2023-08-14 DIAGNOSIS — R093 Abnormal sputum: Secondary | ICD-10-CM | POA: Diagnosis not present

## 2023-08-14 DIAGNOSIS — B4481 Allergic bronchopulmonary aspergillosis: Secondary | ICD-10-CM

## 2023-08-14 DIAGNOSIS — R845 Abnormal microbiological findings in specimens from respiratory organs and thorax: Secondary | ICD-10-CM | POA: Diagnosis not present

## 2023-08-14 DIAGNOSIS — R634 Abnormal weight loss: Secondary | ICD-10-CM

## 2023-08-14 DIAGNOSIS — A312 Disseminated mycobacterium avium-intracellulare complex (DMAC): Secondary | ICD-10-CM | POA: Diagnosis not present

## 2023-08-14 DIAGNOSIS — Z7185 Encounter for immunization safety counseling: Secondary | ICD-10-CM

## 2023-08-14 DIAGNOSIS — B449 Aspergillosis, unspecified: Secondary | ICD-10-CM

## 2023-08-14 LAB — CBC WITH DIFFERENTIAL/PLATELET
Absolute Monocytes: 213 cells/uL (ref 200–950)
Basophils Absolute: 8 cells/uL (ref 0–200)
Basophils Relative: 0.1 %
Eosinophils Absolute: 0 cells/uL — ABNORMAL LOW (ref 15–500)
Eosinophils Relative: 0 %
HCT: 36.1 % — ABNORMAL LOW (ref 38.5–50.0)
Hemoglobin: 12 g/dL — ABNORMAL LOW (ref 13.2–17.1)
Lymphs Abs: 363 cells/uL — ABNORMAL LOW (ref 850–3900)
MCH: 30.5 pg (ref 27.0–33.0)
MCHC: 33.2 g/dL (ref 32.0–36.0)
MCV: 91.6 fL (ref 80.0–100.0)
MPV: 9.1 fL (ref 7.5–12.5)
Monocytes Relative: 2.7 %
Neutro Abs: 7315 cells/uL (ref 1500–7800)
Neutrophils Relative %: 92.6 %
Platelets: 244 10*3/uL (ref 140–400)
RBC: 3.94 10*6/uL — ABNORMAL LOW (ref 4.20–5.80)
RDW: 13.1 % (ref 11.0–15.0)
Total Lymphocyte: 4.6 %
WBC: 7.9 10*3/uL (ref 3.8–10.8)

## 2023-08-14 MED ORDER — AZITHROMYCIN 500 MG PO TABS: 500.0000 mg | ORAL_TABLET | Freq: Every day | ORAL | 11 refills | Status: DC

## 2023-08-14 MED ORDER — CRESEMBA 186 MG PO CAPS
2.0000 | ORAL_CAPSULE | Freq: Every day | ORAL | 11 refills | Status: DC
Start: 2023-08-14 — End: 2023-10-31
  Filled 2023-08-14: qty 56, 28d supply, fill #0
  Filled 2023-09-08: qty 56, 28d supply, fill #1
  Filled 2023-10-10: qty 56, 28d supply, fill #2

## 2023-08-14 MED ORDER — ETHAMBUTOL HCL 400 MG PO TABS: 1000.0000 mg | ORAL_TABLET | Freq: Every day | ORAL | 11 refills | Status: DC

## 2023-08-14 NOTE — Telephone Encounter (Signed)
Patient called wanting to clarify his medication regimen.   States he remembers hearing Dr. Daiva Eves mention something about rifampin during his visit today, but wasn't sure if he was going to go ahead and start him on it or wait until the results of the sputum culture. Will route to provider.   Sandie Ano, RN

## 2023-08-14 NOTE — Telephone Encounter (Signed)
Spoke with [name], notified him that Dr. Daiva Eves would like to wait until the sputum cultures result before making any changes. Patient verbalized understanding and has no further questions.   Sandie Ano, RN

## 2023-08-14 NOTE — Addendum Note (Signed)
Addended by: Harley Alto on: 08/14/2023 01:16 PM   Modules accepted: Orders

## 2023-08-15 ENCOUNTER — Other Ambulatory Visit (HOSPITAL_COMMUNITY): Payer: Self-pay

## 2023-08-15 ENCOUNTER — Other Ambulatory Visit: Payer: Self-pay

## 2023-08-15 LAB — COMPLETE METABOLIC PANEL WITH GFR
AG Ratio: 0.9 (calc) — ABNORMAL LOW (ref 1.0–2.5)
ALT: 19 U/L (ref 9–46)
AST: 26 U/L (ref 10–40)
Albumin: 3.3 g/dL — ABNORMAL LOW (ref 3.6–5.1)
Alkaline phosphatase (APISO): 108 U/L (ref 36–130)
BUN: 11 mg/dL (ref 7–25)
CO2: 28 mmol/L (ref 20–32)
Calcium: 8.7 mg/dL (ref 8.6–10.3)
Chloride: 97 mmol/L — ABNORMAL LOW (ref 98–110)
Creat: 0.69 mg/dL (ref 0.60–1.29)
Globulin: 3.5 g/dL (calc) (ref 1.9–3.7)
Glucose, Bld: 88 mg/dL (ref 65–99)
Potassium: 4.5 mmol/L (ref 3.5–5.3)
Sodium: 137 mmol/L (ref 135–146)
Total Bilirubin: 0.5 mg/dL (ref 0.2–1.2)
Total Protein: 6.8 g/dL (ref 6.1–8.1)
eGFR: 113 mL/min/{1.73_m2} (ref >=60)

## 2023-08-16 ENCOUNTER — Other Ambulatory Visit: Payer: Self-pay

## 2023-08-16 LAB — HEPATITIS B DNA, ULTRAQUANTITATIVE, PCR
Hepatitis B DNA: 17100 [IU]/mL — ABNORMAL HIGH
Hepatitis B virus DNA: 4.23 {Log} — ABNORMAL HIGH

## 2023-08-22 ENCOUNTER — Other Ambulatory Visit: Payer: Self-pay | Admitting: Internal Medicine

## 2023-08-22 ENCOUNTER — Other Ambulatory Visit: Payer: BC Managed Care – PPO

## 2023-08-22 DIAGNOSIS — J4550 Severe persistent asthma, uncomplicated: Secondary | ICD-10-CM

## 2023-08-23 ENCOUNTER — Other Ambulatory Visit: Payer: Self-pay

## 2023-08-23 MED ORDER — FASENRA PEN 30 MG/ML ~~LOC~~ SOAJ
30.0000 mg | SUBCUTANEOUS | 1 refills | Status: DC
  Filled 2023-08-23 – 2023-08-27 (×2): qty 1, 56d supply, fill #0
  Filled 2023-10-15: qty 1, 56d supply, fill #1

## 2023-08-23 NOTE — Telephone Encounter (Signed)
Refill sent for [facility] to [facility]: [phone]  (also on Xolair)  Dose: 30mg  SQ every 8 weeks  Last OV: 07/15/23 Provider: Dr. Marchelle Gearing  Next OV: 09/03/23  Routing to scheduling team for follow-up on appt scheduling  Chesley Mires, PharmD, MPH, BCPS Clinical Pharmacist (Rheumatology and Pulmonology)

## 2023-08-27 ENCOUNTER — Other Ambulatory Visit: Payer: Self-pay

## 2023-08-27 ENCOUNTER — Ambulatory Visit: Payer: BC Managed Care – PPO | Admitting: Registered"

## 2023-08-27 NOTE — Progress Notes (Signed)
Specialty Pharmacy Refill Coordination Note  [name] is a 47 y.o. male contacted today regarding refills of specialty medication(s) Benralizumab; Omalizumab   Patient requested Delivery   Delivery date: 08/30/23   Verified address: [address], [place] [address]   Medication will be filled on 08/29/23.

## 2023-08-29 ENCOUNTER — Other Ambulatory Visit: Payer: Self-pay

## 2023-08-29 ENCOUNTER — Other Ambulatory Visit: Payer: BC Managed Care – PPO

## 2023-09-02 ENCOUNTER — Other Ambulatory Visit: Payer: Self-pay | Admitting: *Deleted

## 2023-09-02 DIAGNOSIS — J4550 Severe persistent asthma, uncomplicated: Secondary | ICD-10-CM

## 2023-09-03 ENCOUNTER — Ambulatory Visit (INDEPENDENT_AMBULATORY_CARE_PROVIDER_SITE_OTHER): Payer: BC Managed Care – PPO | Admitting: Internal Medicine

## 2023-09-03 ENCOUNTER — Ambulatory Visit (INDEPENDENT_AMBULATORY_CARE_PROVIDER_SITE_OTHER): Payer: BC Managed Care – PPO | Admitting: Adult Health

## 2023-09-03 ENCOUNTER — Telehealth: Payer: Self-pay | Admitting: Adult Health

## 2023-09-03 ENCOUNTER — Encounter: Payer: Self-pay | Admitting: Adult Health

## 2023-09-03 VITALS — BP 110/56 | HR 96 | Temp 97.9°F | Ht 68.0 in | Wt 98.0 lb

## 2023-09-03 DIAGNOSIS — J9611 Chronic respiratory failure with hypoxia: Secondary | ICD-10-CM

## 2023-09-03 DIAGNOSIS — B449 Aspergillosis, unspecified: Secondary | ICD-10-CM | POA: Diagnosis not present

## 2023-09-03 DIAGNOSIS — E43 Unspecified severe protein-calorie malnutrition: Secondary | ICD-10-CM

## 2023-09-03 DIAGNOSIS — J4550 Severe persistent asthma, uncomplicated: Secondary | ICD-10-CM | POA: Diagnosis not present

## 2023-09-03 DIAGNOSIS — A310 Pulmonary mycobacterial infection: Secondary | ICD-10-CM

## 2023-09-03 DIAGNOSIS — J4489 Other specified chronic obstructive pulmonary disease: Secondary | ICD-10-CM

## 2023-09-03 DIAGNOSIS — J471 Bronchiectasis with (acute) exacerbation: Secondary | ICD-10-CM

## 2023-09-03 DIAGNOSIS — B4481 Allergic bronchopulmonary aspergillosis: Secondary | ICD-10-CM

## 2023-09-03 MED ORDER — AMOXICILLIN-POT CLAVULANATE 875-125 MG PO TABS: 1.0000 | ORAL_TABLET | Freq: Two times a day (BID) | ORAL | 0 refills | Status: DC

## 2023-09-03 NOTE — Progress Notes (Signed)
Spirometry/ DLCO completed today  ?

## 2023-09-03 NOTE — Assessment & Plan Note (Signed)
Continue current regimen per infectious disease.

## 2023-09-03 NOTE — Assessment & Plan Note (Signed)
Continue on oxygen to maintain O2 saturations greater than 88 to 90%

## 2023-09-03 NOTE — Patient Instructions (Signed)
Spirometry/ DLCO completed today  ?

## 2023-09-03 NOTE — Assessment & Plan Note (Signed)
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  Now dependent on oxygen 24/7.  Continue with mucociliary clearance.  Continue on Breztri twice daily.   Plan  Patient Instructions  Augmentin 875mg  Twice daily  for 10 days , take with food  Continue on BREZTRI 2 puffs Twice daily  , rinse after use.  Prednisone10mg  daily  Flutter valve Three times a day  .  Please eat often,  High protein food and drinks. -try high calorie boost.  Continue on Fasenra and Xolair .  Continue with ID follow up.  Continue on Oxygen 3l/m with activity and At bedtime, to keep O2 sats >88-90%. -Please wear with activity .  Albuterol inhaler As needed  Wheezing/shortness of breath .  Albuterol neb every 6hrs as needed for wheezing /shortness of breath as needed.  Hypertonic nebs 1-2 twice daily followed by Flutter valve.  Zyrtec 10mg  At bedtime  .  Robitussin DM As needed  Cough/congestion  Boost Twice daily  .  Follow up with dietician as discussed.  Follow up with Dr. Marchelle Gearing or Kadesia Robel NP in 6 weeks in ILD clinic  Please contact office for sooner follow up if symptoms do not improve or worsen or seek emergency care

## 2023-09-03 NOTE — Telephone Encounter (Signed)
Patient states needs refill for Albuterol solution. Pharmacy is [facility]. Patient phone number is [phone].

## 2023-09-03 NOTE — Assessment & Plan Note (Signed)
Continue on current regimen with Cresemba per infectious disease
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
## 2023-09-03 NOTE — Assessment & Plan Note (Signed)
Continue on prednisone 10 mg daily.  Continue on Fasenra and Xolair

## 2023-09-03 NOTE — Patient Instructions (Addendum)
Augmentin 875mg  Twice daily  for 10 days , take with food  Continue on BREZTRI 2 puffs Twice daily  , rinse after use.  Prednisone10mg  daily  Flutter valve Three times a day  .  Please eat often,  High protein food and drinks. -try high calorie boost.  Continue on Fasenra and Xolair .  Continue with ID follow up.  Continue on Oxygen 3l/m with activity and At bedtime, to keep O2 sats >88-90%. -Please wear with activity .  Albuterol inhaler As needed  Wheezing/shortness of breath .  Albuterol neb every 6hrs as needed for wheezing /shortness of breath as needed.  Hypertonic nebs 1-2 twice daily followed by Flutter valve.  Zyrtec 10mg  At bedtime  .  Robitussin DM As needed  Cough/congestion  Boost Twice daily  .  Follow up with dietician as discussed.  Follow up with Dr. Marchelle Gearing or Deeksha Cotrell NP in 6 weeks in [facility] clinic  Please contact office for sooner follow up if symptoms do not improve or worsen or seek emergency care

## 2023-09-03 NOTE — Assessment & Plan Note (Signed)
Continue on high-protein diet.  Dietitian referral is pending

## 2023-09-03 NOTE — Assessment & Plan Note (Signed)
Acute asthmatic bronchitic exacerbation -in the setting of ABPA, cavitary lung disease with aspergilloma, severe emphysema and bronchiectasis-we will treat with a 10-day course of Augmentin.  Continue on aggressive maintenance regimen.  Continue with mucociliary clearance with hypertonic nebs and flutter valve.  Patient weight along with frailness do not feel that he would be a candidate for vest therapy  Plan  Patient Instructions  Augmentin 875mg  Twice daily  for 10 days , take with food  Continue on BREZTRI 2 puffs Twice daily  , rinse after use.  Prednisone10mg  daily  Flutter valve Three times a day  .  Please eat often,  High protein food and drinks. -try high calorie boost.  Continue on Fasenra and Xolair .  Continue with ID follow up.  Continue on Oxygen 3l/m with activity and At bedtime, to keep O2 sats >88-90%. -Please wear with activity .  Albuterol inhaler As needed  Wheezing/shortness of breath .  Albuterol neb every 6hrs as needed for wheezing /shortness of breath as needed.  Hypertonic nebs 1-2 twice daily followed by Flutter valve.  Zyrtec 10mg  At bedtime  .  Robitussin DM As needed  Cough/congestion  Boost Twice daily  .  Follow up with dietician as discussed.  Follow up with Dr. Marchelle Gearing or Callee Rohrig NP in 6 weeks in [facility] clinic  Please contact office for sooner follow up if symptoms do not improve or worsen or seek emergency care

## 2023-09-04 ENCOUNTER — Other Ambulatory Visit: Payer: Self-pay

## 2023-09-04 MED ORDER — ALBUTEROL SULFATE (2.5 MG/3ML) 0.083% IN NEBU: 2.5000 mg | INHALATION_SOLUTION | RESPIRATORY_TRACT | 0 refills | Status: DC | PRN

## 2023-09-04 NOTE — Telephone Encounter (Signed)
Albuterol solution was sent earlier today. Pt aware.

## 2023-09-06 DIAGNOSIS — J9611 Chronic respiratory failure with hypoxia: Secondary | ICD-10-CM | POA: Diagnosis not present

## 2023-09-09 ENCOUNTER — Telehealth: Payer: Self-pay

## 2023-09-09 ENCOUNTER — Other Ambulatory Visit: Payer: Self-pay

## 2023-09-09 NOTE — Telephone Encounter (Signed)
-----   Message from [place] sent at 09/09/2023 11:32 AM EDT ----- AFB culture no growth this is reassuring as far as his M avium ----- Message ----- From: Janace Hoard Lab Results In Sent: 08/14/2023   3:27 PM EDT To: Randall Hiss, MD

## 2023-09-10 ENCOUNTER — Other Ambulatory Visit: Payer: Self-pay

## 2023-09-10 ENCOUNTER — Other Ambulatory Visit (HOSPITAL_COMMUNITY): Payer: Self-pay

## 2023-09-11 ENCOUNTER — Telehealth: Payer: Self-pay

## 2023-09-11 NOTE — Telephone Encounter (Signed)
[facility] Patient Advocate Encounter   Received notification from [facility] that prior authorization for Shelle Iron is required.   PA submitted on 09/11/23 Key [id number] Status is pending    [facility] will continue to follow.   Clearance Coots, CPhT Specialty Pharmacy Patient [facility] for Infectious Disease Phone: [phone] Fax:  [phone]

## 2023-09-12 ENCOUNTER — Other Ambulatory Visit: Payer: Self-pay

## 2023-09-16 ENCOUNTER — Other Ambulatory Visit (HOSPITAL_COMMUNITY): Payer: Self-pay

## 2023-09-16 ENCOUNTER — Telehealth: Payer: Self-pay

## 2023-09-16 DIAGNOSIS — R600 Localized edema: Secondary | ICD-10-CM | POA: Diagnosis not present

## 2023-09-16 DIAGNOSIS — R630 Anorexia: Secondary | ICD-10-CM | POA: Diagnosis not present

## 2023-09-16 DIAGNOSIS — F325 Major depressive disorder, single episode, in full remission: Secondary | ICD-10-CM | POA: Diagnosis not present

## 2023-09-16 DIAGNOSIS — R634 Abnormal weight loss: Secondary | ICD-10-CM | POA: Diagnosis not present

## 2023-09-16 NOTE — Telephone Encounter (Signed)
[facility] Patient Advocate Encounter  Prior Authorization for Shelle Iron has been approved.    PA# [id number] Effective dates: 09/11/23 through 03/09/24  Patients co-pay is $0.00.   [facility] will continue to follow.  Clearance Coots, CPhT Specialty Pharmacy Patient [facility] for Infectious Disease Phone: [phone] Fax:  [phone]

## 2023-09-17 ENCOUNTER — Other Ambulatory Visit: Payer: Self-pay

## 2023-09-18 ENCOUNTER — Other Ambulatory Visit (HOSPITAL_COMMUNITY): Payer: Self-pay | Admitting: Pharmacy Technician

## 2023-09-18 ENCOUNTER — Other Ambulatory Visit (HOSPITAL_COMMUNITY): Payer: Self-pay

## 2023-09-18 NOTE — Progress Notes (Signed)
Specialty Pharmacy Refill Coordination Note  [name] is a 47 y.o. male contacted today regarding refills of specialty medication(s) Omalizumab   Patient requested Delivery   Delivery date: 09/26/23   Verified address: [address] [place]   Medication will be filled on 09/25/23.

## 2023-09-23 LAB — PULMONARY FUNCTION TEST
DL/VA % pred: 36 %
DL/VA: 1.64 ml/min/mmHg/L
DLCO cor % pred: 21 %
DLCO cor: 6.45 ml/min/mmHg
DLCO unc % pred: 19 %
DLCO unc: 5.92 ml/min/mmHg
FEF 25-75 Pre: 0.32 L/s
FEF2575-%Pred-Pre: 9 %
FEV1-%Pred-Pre: 21 %
FEV1-Pre: 0.74 L
FEV1FVC-%Pred-Pre: 62 %
FEV6-%Pred-Pre: 31 %
FEV6-Pre: 1.45 L
FEV6FVC-%Pred-Pre: 92 %
FVC-%Pred-Pre: 34 %
FVC-Pre: 1.48 L
Pre FEV1/FVC ratio: 50 %
Pre FEV6/FVC Ratio: 98 %

## 2023-09-25 ENCOUNTER — Other Ambulatory Visit: Payer: Self-pay

## 2023-09-27 LAB — MYCOBACTERIA,CULT W/FLUOROCHROME SMEAR
MICRO NUMBER:: 15519429
SMEAR:: NONE SEEN
SPECIMEN QUALITY:: ADEQUATE

## 2023-09-30 ENCOUNTER — Ambulatory Visit
Admission: RE | Admit: 2023-09-30 | Discharge: 2023-09-30 | Disposition: A | Discharge status: Home / Self Care | Payer: BC Managed Care – PPO | Source: Ambulatory Visit | Attending: Infectious Disease | Admitting: Infectious Disease

## 2023-09-30 DIAGNOSIS — B181 Chronic viral hepatitis B without delta-agent: Secondary | ICD-10-CM

## 2023-09-30 DIAGNOSIS — K8020 Calculus of gallbladder without cholecystitis without obstruction: Secondary | ICD-10-CM | POA: Diagnosis not present

## 2023-10-01 ENCOUNTER — Other Ambulatory Visit: Payer: Self-pay

## 2023-10-07 DIAGNOSIS — J9611 Chronic respiratory failure with hypoxia: Secondary | ICD-10-CM | POA: Diagnosis not present

## 2023-10-10 ENCOUNTER — Other Ambulatory Visit: Payer: Self-pay

## 2023-10-14 ENCOUNTER — Other Ambulatory Visit (HOSPITAL_COMMUNITY): Payer: Self-pay

## 2023-10-15 ENCOUNTER — Other Ambulatory Visit (HOSPITAL_COMMUNITY): Payer: Self-pay | Admitting: Pharmacy Technician

## 2023-10-15 ENCOUNTER — Other Ambulatory Visit (HOSPITAL_COMMUNITY): Payer: Self-pay

## 2023-10-15 NOTE — Progress Notes (Signed)
Specialty Pharmacy Refill Coordination Note  [name] is a 47 y.o. male contacted today regarding refills of specialty medication(s) Benralizumab   Patient requested Delivery   Delivery date: 10/24/23   Verified address: [address]  [place]   Medication will be filled on 10/23/23.

## 2023-10-15 NOTE — Progress Notes (Signed)
Specialty Pharmacy Refill Coordination Note  [name] is a 47 y.o. male contacted today regarding refills of specialty medication(s) Omalizumab   Patient requested Delivery   Delivery date: 10/24/23   Verified address: [address] [place]   Medication will be filled on 10/23/23.

## 2023-10-19 ENCOUNTER — Other Ambulatory Visit: Payer: Self-pay | Admitting: Adult Health

## 2023-10-23 ENCOUNTER — Other Ambulatory Visit: Payer: Self-pay

## 2023-10-28 ENCOUNTER — Ambulatory Visit: Payer: BC Managed Care – PPO | Admitting: Adult Health

## 2023-10-29 ENCOUNTER — Ambulatory Visit: Payer: BC Managed Care – PPO | Admitting: Internal Medicine

## 2023-10-31 ENCOUNTER — Other Ambulatory Visit (HOSPITAL_COMMUNITY): Payer: Self-pay

## 2023-10-31 ENCOUNTER — Other Ambulatory Visit: Payer: Self-pay

## 2023-10-31 ENCOUNTER — Other Ambulatory Visit: Payer: Self-pay | Admitting: Pharmacist

## 2023-10-31 ENCOUNTER — Encounter: Payer: Self-pay | Admitting: Infectious Disease

## 2023-10-31 ENCOUNTER — Ambulatory Visit (INDEPENDENT_AMBULATORY_CARE_PROVIDER_SITE_OTHER): Payer: BC Managed Care – PPO | Admitting: Infectious Disease

## 2023-10-31 VITALS — BP 120/69 | HR 84 | Resp 15 | Ht 68.0 in | Wt 105.0 lb

## 2023-10-31 DIAGNOSIS — B4481 Allergic bronchopulmonary aspergillosis: Secondary | ICD-10-CM

## 2023-10-31 DIAGNOSIS — B181 Chronic viral hepatitis B without delta-agent: Secondary | ICD-10-CM

## 2023-10-31 DIAGNOSIS — B449 Aspergillosis, unspecified: Secondary | ICD-10-CM

## 2023-10-31 DIAGNOSIS — J471 Bronchiectasis with (acute) exacerbation: Secondary | ICD-10-CM

## 2023-10-31 DIAGNOSIS — A310 Pulmonary mycobacterial infection: Secondary | ICD-10-CM

## 2023-10-31 DIAGNOSIS — J9611 Chronic respiratory failure with hypoxia: Secondary | ICD-10-CM

## 2023-10-31 MED ORDER — CRESEMBA 186 MG PO CAPS
2.0000 | ORAL_CAPSULE | Freq: Every day | ORAL | 11 refills | Status: DC
  Filled 2023-10-31: qty 56, 28d supply, fill #0
  Filled 2023-12-05: qty 56, 28d supply, fill #1
  Filled 2024-01-08: qty 56, 28d supply, fill #2
  Filled 2024-02-03: qty 56, 28d supply, fill #3
  Filled 2024-03-11 (×2): qty 56, 28d supply, fill #4
  Filled 2024-04-06: qty 56, 28d supply, fill #5
  Filled 2024-05-11: qty 56, 28d supply, fill #6
  Filled 2024-06-04: qty 56, 28d supply, fill #7
  Filled 2024-07-02: qty 56, 28d supply, fill #8
  Filled 2024-07-30: qty 56, 28d supply, fill #9
  Filled 2024-08-28: qty 56, 28d supply, fill #10

## 2023-10-31 MED ORDER — AZITHROMYCIN 500 MG PO TABS: 500.0000 mg | ORAL_TABLET | Freq: Every day | ORAL | 11 refills | Status: DC

## 2023-10-31 MED ORDER — TENOFOVIR ALAFENAMIDE FUMARATE 25 MG PO TABS
25.0000 mg | ORAL_TABLET | Freq: Every day | ORAL | 11 refills | Status: DC
  Filled 2023-10-31: qty 30, 30d supply, fill #0
  Filled 2023-11-14: qty 30, 30d supply, fill #1
  Filled 2023-12-12: qty 30, 30d supply, fill #2
  Filled 2024-01-21: qty 30, 30d supply, fill #3
  Filled 2024-02-28 (×2): qty 30, 30d supply, fill #4
  Filled 2024-04-01: qty 30, 30d supply, fill #5
  Filled 2024-04-27: qty 30, 30d supply, fill #6
  Filled 2024-05-25: qty 30, 30d supply, fill #7
  Filled 2024-06-16 – 2024-06-23 (×2): qty 30, 30d supply, fill #8
  Filled 2024-07-17 – 2024-07-27 (×2): qty 30, 30d supply, fill #9
  Filled 2024-08-20: qty 30, 30d supply, fill #10

## 2023-10-31 MED ORDER — ETHAMBUTOL HCL 400 MG PO TABS: 1000.0000 mg | ORAL_TABLET | Freq: Every day | ORAL | 3 refills | Status: AC

## 2023-10-31 NOTE — Progress Notes (Signed)
Clinical Intervention Note  Clinical Intervention Notes: discussed not guideline based treatment but appropriate to start given patient's other comorbidities   Clinical Intervention Outcomes: Improved therapy effectiveness  Patient does not meet AASLD guideline recommendations for HBV treatment with HBeAg-negative, HBV DNA > 2000, AND ALT elevation. His LFTs are currently normal even though his HBV DNA is well above > 2000 now. Repeating CMP today so will follow up on results. Per Dr. Daiva Eves, will start begin HBV treatment given increasing viral load and other comorbidities.   Jennette Kettle Specialty Pharmacist

## 2023-10-31 NOTE — Progress Notes (Signed)
Specialty Pharmacy Initiation Note   [name] is a 47 y.o. male who will be followed by the specialty pharmacy service for RxSp Hepatitis B    Review of administration, indication, effectiveness, safety, potential side effects, storage/disposable, and missed dose instructions occurred today for patient's specialty medication(s) Tenofovir Alafenamide Fumarate (VEMLIDY)     Patient/Caregiver did not have any additional questions or concerns.   Patient's therapy is appropriate to: Initiate    Goals Addressed             This Visit's Progress    Achieve sustained HBV viral load suppression       Patient is initiating therapy. Patient will be evaluated at upcoming provider appointment to assess progress      Comply with lab assessments       Patient is on track. Patient will adhere to provider and/or lab appointments      Maintain optimal adherence to therapy       Patient is initiating therapy. Patient will be evaluated at upcoming provider appointment to assess progress         Jennette Kettle Specialty Pharmacist

## 2023-10-31 NOTE — Patient Instructions (Addendum)
ICD-10-CM   1. 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 Bronchiectasis with acute exacerbation (HCC)  J47.1     3. Severe persistent asthma, unspecified whether complicated  J45.50     4. 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HCC)  B44.9     5. Underweight, BMI < 18.5%  R63.6     6. Elevated IgE level  R76.8         Stable and overall better since o2 , fasenra and add [employer] like vest is expensive and not beneficial Current oxygen use is as needed  Plan  -cotninue 3mL 3% saline neb once daily to help mucus clearance  - continue o2 at night and exertion; try to increase the usage  = continue breztri, xolair and fasenra and daily prednisone - continue ABPA/MAC Rx with Dr Algis Liming  Followup  - March 2025
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
## 2023-10-31 NOTE — Progress Notes (Signed)
Specialty Pharmacy Initial Fill Coordination Note  [name] is a 47 y.o. male contacted today regarding initial fill of specialty medication(s) Tenofovir Alafenamide Fumarate (VEMLIDY)   Patient requested Delivery   Delivery date: 11/04/23   Verified address: Patient address [address]  [place] [address]   Medication will be filled on 10/31/23.   Patient is aware of 0.00 copayment.

## 2023-11-01 ENCOUNTER — Telehealth: Payer: BC Managed Care – PPO | Admitting: Internal Medicine

## 2023-11-01 ENCOUNTER — Other Ambulatory Visit: Payer: Self-pay

## 2023-11-01 DIAGNOSIS — J4550 Severe persistent asthma, uncomplicated: Secondary | ICD-10-CM

## 2023-11-01 DIAGNOSIS — B449 Aspergillosis, unspecified: Secondary | ICD-10-CM

## 2023-11-01 DIAGNOSIS — B4481 Allergic bronchopulmonary aspergillosis: Secondary | ICD-10-CM

## 2023-11-01 DIAGNOSIS — R636 Underweight: Secondary | ICD-10-CM

## 2023-11-01 DIAGNOSIS — R768 Other specified abnormal immunological findings in serum: Secondary | ICD-10-CM

## 2023-11-01 DIAGNOSIS — J471 Bronchiectasis with (acute) exacerbation: Secondary | ICD-10-CM | POA: Diagnosis not present

## 2023-11-03 LAB — COMPLETE METABOLIC PANEL WITH GFR
AG Ratio: 1 (calc) (ref 1.0–2.5)
ALT: 11 U/L (ref 9–46)
AST: 12 U/L (ref 10–40)
Albumin: 3.3 g/dL — ABNORMAL LOW (ref 3.6–5.1)
Alkaline phosphatase (APISO): 125 U/L (ref 36–130)
BUN: 9 mg/dL (ref 7–25)
CO2: 32 mmol/L (ref 20–32)
Calcium: 8.3 mg/dL — ABNORMAL LOW (ref 8.6–10.3)
Chloride: 101 mmol/L (ref 98–110)
Creat: 0.6 mg/dL (ref 0.60–1.29)
Globulin: 3.2 g/dL (ref 1.9–3.7)
Glucose, Bld: 65 mg/dL (ref 65–99)
Potassium: 3.6 mmol/L (ref 3.5–5.3)
Sodium: 140 mmol/L (ref 135–146)
Total Bilirubin: 0.3 mg/dL (ref 0.2–1.2)
Total Protein: 6.5 g/dL (ref 6.1–8.1)
eGFR: 118 mL/min/{1.73_m2} (ref >=60)

## 2023-11-03 LAB — CBC WITH DIFFERENTIAL/PLATELET
Absolute Lymphocytes: 512 {cells}/uL — ABNORMAL LOW (ref 850–3900)
Absolute Monocytes: 358 {cells}/uL (ref 200–950)
Basophils Absolute: 13 {cells}/uL (ref 0–200)
Basophils Relative: 0.1 %
Eosinophils Absolute: 0 {cells}/uL — ABNORMAL LOW (ref 15–500)
Eosinophils Relative: 0 %
HCT: 33.3 % — ABNORMAL LOW (ref 38.5–50.0)
Hemoglobin: 11 g/dL — ABNORMAL LOW (ref 13.2–17.1)
MCH: 30 pg (ref 27.0–33.0)
MCHC: 33 g/dL (ref 32.0–36.0)
MCV: 90.7 fL (ref 80.0–100.0)
MPV: 8.8 fL (ref 7.5–12.5)
Monocytes Relative: 2.8 %
Neutro Abs: 11917 {cells}/uL — ABNORMAL HIGH (ref 1500–7800)
Neutrophils Relative %: 93.1 %
Platelets: 259 10*3/uL (ref 140–400)
RBC: 3.67 10*6/uL — ABNORMAL LOW (ref 4.20–5.80)
RDW: 12.6 % (ref 11.0–15.0)
Total Lymphocyte: 4 %
WBC: 12.8 10*3/uL — ABNORMAL HIGH (ref 3.8–10.8)

## 2023-11-03 LAB — HEPATITIS B DNA, ULTRAQUANTITATIVE, PCR
Hepatitis B DNA: 2840 [IU]/mL — ABNORMAL HIGH
Hepatitis B virus DNA: 3.45 {Log} — ABNORMAL HIGH

## 2023-11-04 ENCOUNTER — Other Ambulatory Visit: Payer: Self-pay

## 2023-11-05 ENCOUNTER — Other Ambulatory Visit: Payer: Self-pay

## 2023-11-05 DIAGNOSIS — R600 Localized edema: Secondary | ICD-10-CM | POA: Diagnosis not present

## 2023-11-06 DIAGNOSIS — J9611 Chronic respiratory failure with hypoxia: Secondary | ICD-10-CM | POA: Diagnosis not present

## 2023-11-12 ENCOUNTER — Other Ambulatory Visit: Payer: Self-pay | Admitting: Adult Health

## 2023-11-13 ENCOUNTER — Other Ambulatory Visit: Payer: Self-pay | Admitting: Adult Health

## 2023-11-14 ENCOUNTER — Other Ambulatory Visit: Payer: Self-pay

## 2023-11-14 ENCOUNTER — Other Ambulatory Visit: Payer: Self-pay | Admitting: Internal Medicine

## 2023-11-14 DIAGNOSIS — J4550 Severe persistent asthma, uncomplicated: Secondary | ICD-10-CM

## 2023-11-14 NOTE — Progress Notes (Signed)
Specialty Pharmacy Refill Coordination Note  [name] is a 47 y.o. male contacted today regarding refills of specialty medication(s) Xolair   Patient requested Delivery   Delivery date: 11/22/23   Verified address: Patient address [address]  [place] [address]   Medication will be filled on 11/21/23.

## 2023-11-14 NOTE — Progress Notes (Signed)
Specialty Pharmacy Refill Coordination Note  [name] is a 47 y.o. male contacted today regarding refills of specialty medication(s) Tenofovir Alafenamide Fumarate (VEMLIDY)   Patient requested Delivery   Delivery date: 11/29/23   Verified address: Patient address [address]  [place] [address]   Medication will be filled on 11/28/23.

## 2023-11-14 NOTE — Progress Notes (Signed)
Specialty Pharmacy Ongoing Clinical Assessment Note  [name] is a 47 y.o. male who is being followed by the specialty pharmacy service for Asthma/COPD  Patient's specialty medication(s) reviewed today: Xolair, Fasenra   Missed doses in the last 4 weeks: 0   Patient/Caregiver did not have any additional questions or concerns.   Therapeutic benefit summary: Patient is achieving benefit   Adverse events/side effects summary: No adverse events/side effects   Patient's therapy is appropriate to: Continue    Goals Addressed             This Visit's Progress    Reduce disease symptoms including coughing and shortness of breath       Patient is on track. Patient will maintain adherence         Follow up:  6 months  Otto Herb Specialty Pharmacist

## 2023-11-18 ENCOUNTER — Encounter: Payer: Self-pay | Admitting: Internal Medicine

## 2023-11-19 ENCOUNTER — Other Ambulatory Visit: Payer: Self-pay

## 2023-11-19 MED ORDER — OMALIZUMAB 75 MG/0.5ML ~~LOC~~ SOSY
PREFILLED_SYRINGE | SUBCUTANEOUS | 2 refills | Status: DC
  Filled 2023-11-19: qty 1, 28d supply, fill #0
  Filled 2023-12-12: qty 1, 28d supply, fill #1
  Filled 2024-01-08: qty 1, 28d supply, fill #2

## 2023-11-19 MED ORDER — OMALIZUMAB 150 MG/ML ~~LOC~~ SOSY
PREFILLED_SYRINGE | SUBCUTANEOUS | 2 refills | Status: DC
  Filled 2023-11-19: qty 4, 28d supply, fill #0
  Filled 2023-12-12: qty 4, 28d supply, fill #1
  Filled 2024-01-08: qty 4, 28d supply, fill #2

## 2023-11-19 NOTE — Telephone Encounter (Signed)
 Refill sent for XOLAIR  to [facility]: [phone]   Dose: 375mg  SQ every 14 days  Last OV: 09/03/23 Provider: Dr. Geronimo  Routing to scheduling team for follow-up on appt scheduling  Sherry Pennant, PharmD, MPH, BCPS Clinical Pharmacist (Rheumatology and Pulmonology)

## 2023-11-21 ENCOUNTER — Other Ambulatory Visit: Payer: Self-pay | Admitting: *Deleted

## 2023-11-21 ENCOUNTER — Other Ambulatory Visit: Payer: Self-pay

## 2023-11-21 ENCOUNTER — Telehealth: Payer: Self-pay

## 2023-11-21 MED ORDER — OXYCODONE HCL 5 MG PO TABS: 5.0000 mg | ORAL_TABLET | Freq: Two times a day (BID) | ORAL | 0 refills | Status: AC | PRN

## 2023-11-21 NOTE — Telephone Encounter (Signed)
 sent

## 2023-11-21 NOTE — Telephone Encounter (Signed)
*  Pulm  Pharmacy Patient Advocate Encounter   Received notification from CoverMyMeds that prior authorization for oxyCODONE  HCl 5MG  tablets  is required/requested.   Insurance verification completed.   The patient is insured through [facility] .   Per test claim: PA required; PA submitted to above mentioned insurance via CoverMyMeds Key/confirmation #/EOC [id number] Status is pending

## 2023-11-21 NOTE — Telephone Encounter (Signed)
 Pharmacy Patient Advocate Encounter  Received notification from [facility] that Prior Authorization for Oxycodone 5mg  has been APPROVED from 11/21/2023 to 12/21/2023

## 2023-11-21 NOTE — Telephone Encounter (Signed)
 Ok for oxycodone  5mg  Q12h prn x 14 days. NO refills. IF het gets worse to go to ER. I am in hos[ital and there are not enough finger print sensors. Please do the script and send it back for my fingeprint. I will find one this afternoon or evening and sign. Is veyr busy here
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
## 2023-11-22 ENCOUNTER — Other Ambulatory Visit: Payer: Self-pay

## 2023-11-25 ENCOUNTER — Other Ambulatory Visit: Payer: Self-pay

## 2023-11-26 ENCOUNTER — Other Ambulatory Visit: Payer: Self-pay

## 2023-11-27 ENCOUNTER — Other Ambulatory Visit: Payer: Self-pay

## 2023-11-27 ENCOUNTER — Telehealth: Payer: Self-pay | Admitting: Pharmacist

## 2023-11-27 NOTE — Telephone Encounter (Signed)
 Received notification from [facility] regarding a prior authorization for XOLAIR . Authorization has been APPROVED from 1/8/202 to 11/26/2024. Approval letter sent to scan center.  Patient can continue to fill through [facility]: [phone]   Authorization # [id number]  Tamra with [facility] notified  Sherry Pennant, PharmD, MPH, BCPS, CPP Clinical Pharmacist (Rheumatology and Pulmonology)

## 2023-11-27 NOTE — Telephone Encounter (Signed)
 Received notice from [facility] that patient's Xolair is due for PA renewal  Submitted an URGENT Prior Authorization request to [facility] for XOLAIR via CoverMyMeds. Will update once we receive a response.  Key: [id number]

## 2023-11-28 ENCOUNTER — Other Ambulatory Visit: Payer: Self-pay

## 2023-11-28 ENCOUNTER — Telehealth: Payer: Self-pay

## 2023-11-28 ENCOUNTER — Other Ambulatory Visit (HOSPITAL_COMMUNITY): Payer: Self-pay

## 2023-11-28 NOTE — Telephone Encounter (Signed)
[facility] Patient Advocate Encounter °  °Was successful in obtaining a [employer] copay card for Vemlidy.  This copay card will make the patients copay 0.00. ° °I have spoken with the patient.   ° °The billing information is as follows and has been shared with [facility]. ° ° ° ° ° °Perry Brucato, CPhT °Specialty Pharmacy Patient Advocate °[facility] °Phone: [phone] °Fax:  [phone]  °

## 2023-12-05 ENCOUNTER — Other Ambulatory Visit: Payer: Self-pay

## 2023-12-07 DIAGNOSIS — J9611 Chronic respiratory failure with hypoxia: Secondary | ICD-10-CM | POA: Diagnosis not present

## 2023-12-12 ENCOUNTER — Other Ambulatory Visit: Payer: Self-pay | Admitting: Internal Medicine

## 2023-12-12 ENCOUNTER — Other Ambulatory Visit: Payer: Self-pay

## 2023-12-12 ENCOUNTER — Other Ambulatory Visit (HOSPITAL_COMMUNITY): Payer: Self-pay

## 2023-12-12 DIAGNOSIS — J4550 Severe persistent asthma, uncomplicated: Secondary | ICD-10-CM

## 2023-12-12 MED ORDER — FASENRA PEN 30 MG/ML ~~LOC~~ SOAJ
30.0000 mg | SUBCUTANEOUS | 1 refills | Status: DC
  Filled 2023-12-12: qty 1, 56d supply, fill #0
  Filled 2024-02-03: qty 1, 56d supply, fill #1

## 2023-12-12 NOTE — Progress Notes (Signed)
Specialty Pharmacy Refill Coordination Note  [name] is a 48 y.o. male contacted today regarding refills of specialty medication(s) Benralizumab (Fasenra Pen); Omalizumab [name]   Patient requested Delivery   Delivery date: 12/24/23   Verified address: Patient address [address]  [place] [address]   Medication will be filled on 12/23/23.

## 2023-12-12 NOTE — Progress Notes (Addendum)
Specialty Pharmacy Refill Coordination Note  [name] is a 48 y.o. male contacted today regarding refills of specialty medication(s) Tenofovir Alafenamide Fumarate (VEMLIDY)   Patient requested Delivery   Delivery date: 12/30/23   Verified address: Patient address [address]  [place] [address]   Medication will be filled on 12/27/23.     12/23/23: [name] is rejecting for PA, but chart indicates PA was approved 12/18/23. Messaged [name], and he will call insurance.

## 2023-12-13 ENCOUNTER — Other Ambulatory Visit: Payer: Self-pay

## 2023-12-18 ENCOUNTER — Telehealth: Payer: Self-pay

## 2023-12-18 ENCOUNTER — Other Ambulatory Visit: Payer: Self-pay

## 2023-12-18 ENCOUNTER — Other Ambulatory Visit (HOSPITAL_COMMUNITY): Payer: Self-pay

## 2023-12-18 NOTE — Telephone Encounter (Signed)
Received fax from [facility] stating that they could not find a previous approval on file for [name] and that an "initial" form would need to be completed. Reached out and spoke with a rep in the Missing Clinicals dept and explained that we had previously appealed and overturned a denial back in December of 2023, and was prepared to provide the appeal ID and case reference number if needed. The rep was able to successfully locate this case and has copy-and-pasted the appeal information into the current case and forwarded it back for additional review.   Will continue to await determination.

## 2023-12-18 NOTE — Telephone Encounter (Signed)
Received notification from [facility] regarding a prior authorization for [facility]. Authorization has been APPROVED from 12/18/2023 to 12/17/2024 (!!!). Approval letter sent to scan center.  Authorization # [id number]  Victorino Dike at [facility] has been notified.

## 2023-12-18 NOTE — Telephone Encounter (Signed)
Received notification from [facility] pharmacy that patient requires a new authorization for their medication.  Submitted an URGENT Prior Authorization request to [facility] for [facility] via CoverMyMeds. Will update once we receive a response.  Key: [id number]

## 2023-12-23 ENCOUNTER — Other Ambulatory Visit (HOSPITAL_COMMUNITY): Payer: Self-pay

## 2023-12-23 ENCOUNTER — Telehealth: Payer: Self-pay

## 2023-12-23 ENCOUNTER — Other Ambulatory Visit: Payer: Self-pay

## 2023-12-23 NOTE — Telephone Encounter (Signed)
Received notification from CF that pt's [name] was still rejection despite PA being recently renewed. Per rejection, plan limitations cannot exceed a 30 day supply. However, attempting to run it as a 30 day supply rejects as a quantity limit exception.  Contacted the pharmacist help desk on the back of the pt's insurance card and was redirected to call a different number, as supposedly pharmacy benefits are handled by a different plan. Was given the number [phone] to contact the PA help desk.  Called and spoke with rep who, after extensive hold time, advised that he is initiating an override on their end and to resubmit the claim tomorrow. I have notified CF and will keep an eye on the situation.

## 2023-12-24 ENCOUNTER — Other Ambulatory Visit: Payer: Self-pay

## 2023-12-24 ENCOUNTER — Other Ambulatory Visit (HOSPITAL_COMMUNITY): Payer: Self-pay

## 2023-12-24 NOTE — Progress Notes (Signed)
Shipment was delayed d/t insurance issue and updating of copayment card. Patient aware will ship today 2/4.

## 2023-12-24 NOTE — Telephone Encounter (Signed)
 [name] at [facility] reached out again this morning and stated that [name]'s copay card is expired. She states that it has apparently been expired since October of 2024, however nothing had been done about it because they haven't had to use it.  Called [name] to discuss, he verbalized understanding about what needs to be done and requests information be sent to him through MyChart.

## 2023-12-24 NOTE — Telephone Encounter (Signed)
Received notification from [place] at [facility] that pt has contacted them and provided updated copay card info. She has reprocessed his claim and is good to go. Nothing further needed at this time.

## 2023-12-26 ENCOUNTER — Other Ambulatory Visit: Payer: Self-pay | Admitting: Adult Health

## 2023-12-27 ENCOUNTER — Other Ambulatory Visit (HOSPITAL_COMMUNITY): Payer: Self-pay

## 2023-12-27 ENCOUNTER — Other Ambulatory Visit: Payer: Self-pay

## 2024-01-06 ENCOUNTER — Ambulatory Visit: Payer: BC Managed Care – PPO | Admitting: Infectious Disease

## 2024-01-06 DIAGNOSIS — B440 Invasive pulmonary aspergillosis: Secondary | ICD-10-CM

## 2024-01-06 DIAGNOSIS — A310 Pulmonary mycobacterial infection: Secondary | ICD-10-CM

## 2024-01-06 DIAGNOSIS — B449 Aspergillosis, unspecified: Secondary | ICD-10-CM

## 2024-01-06 DIAGNOSIS — B181 Chronic viral hepatitis B without delta-agent: Secondary | ICD-10-CM

## 2024-01-06 DIAGNOSIS — B4481 Allergic bronchopulmonary aspergillosis: Secondary | ICD-10-CM

## 2024-01-07 DIAGNOSIS — J9611 Chronic respiratory failure with hypoxia: Secondary | ICD-10-CM | POA: Diagnosis not present

## 2024-01-08 ENCOUNTER — Other Ambulatory Visit: Payer: Self-pay

## 2024-01-08 NOTE — Progress Notes (Signed)
Specialty Pharmacy Refill Coordination Note  [name] is a 48 y.o. male contacted today regarding refills of specialty medication(s) Omalizumab [name]   Patient requested Delivery   Delivery date: 01/21/24   Verified address: Patient address [address]  [place] [address]   Medication will be filled on 01/20/24.

## 2024-01-09 ENCOUNTER — Other Ambulatory Visit: Payer: Self-pay

## 2024-01-15 ENCOUNTER — Ambulatory Visit: Payer: BC Managed Care – PPO | Admitting: Infectious Disease

## 2024-01-20 ENCOUNTER — Other Ambulatory Visit: Payer: Self-pay

## 2024-01-20 DIAGNOSIS — W908XXD Exposure to other nonionizing radiation, subsequent encounter: Secondary | ICD-10-CM | POA: Diagnosis not present

## 2024-01-20 DIAGNOSIS — L819 Disorder of pigmentation, unspecified: Secondary | ICD-10-CM | POA: Diagnosis not present

## 2024-01-20 DIAGNOSIS — L578 Other skin changes due to chronic exposure to nonionizing radiation: Secondary | ICD-10-CM | POA: Diagnosis not present

## 2024-01-20 DIAGNOSIS — L821 Other seborrheic keratosis: Secondary | ICD-10-CM | POA: Diagnosis not present

## 2024-01-20 DIAGNOSIS — L570 Actinic keratosis: Secondary | ICD-10-CM | POA: Diagnosis not present

## 2024-01-21 ENCOUNTER — Other Ambulatory Visit: Payer: Self-pay

## 2024-01-21 ENCOUNTER — Other Ambulatory Visit (HOSPITAL_COMMUNITY): Payer: Self-pay

## 2024-01-21 NOTE — Progress Notes (Signed)
 Specialty Pharmacy Refill Coordination Note  [name] is a 48 y.o. male contacted today regarding refills of specialty medication(s) Tenofovir Alafenamide Fumarate (VEMLIDY)   Patient requested (Patient-Rptd) Delivery   Delivery date: (Patient-Rptd) 01/24/24   Verified address: ([address]Rptd[address] [address]   Medication will be filled on 03.06.25.

## 2024-01-23 ENCOUNTER — Other Ambulatory Visit: Payer: Self-pay

## 2024-02-03 ENCOUNTER — Other Ambulatory Visit (HOSPITAL_COMMUNITY): Payer: Self-pay

## 2024-02-03 ENCOUNTER — Other Ambulatory Visit: Payer: Self-pay | Admitting: Internal Medicine

## 2024-02-03 ENCOUNTER — Other Ambulatory Visit: Payer: Self-pay

## 2024-02-03 DIAGNOSIS — J4550 Severe persistent asthma, uncomplicated: Secondary | ICD-10-CM

## 2024-02-03 NOTE — Progress Notes (Signed)
 Specialty Pharmacy Refill Coordination Note  [name] is a 48 y.o. male contacted today regarding refills of specialty medication(s) Benralizumab [name] Pen)   Patient requested Delivery   Delivery date: 02/13/24   Verified address: [address]   [place] [address]   Medication will be filled on 02/12/24.

## 2024-02-03 NOTE — Progress Notes (Signed)
 Specialty Pharmacy Refill Coordination Note  [name] is a 48 y.o. male contacted today regarding refills of specialty medication(s) Omalizumab [name]   Patient requested Delivery   Delivery date: 02/13/24   Verified address: [address]   [place] [address]   Medication will be filled on 02/12/24.   This fill date is pending response to refill request from provider. Patient is aware and if they have not received fill by intended date, they must follow up with pharmacy.

## 2024-02-04 ENCOUNTER — Other Ambulatory Visit (HOSPITAL_COMMUNITY): Payer: Self-pay

## 2024-02-04 DIAGNOSIS — J9611 Chronic respiratory failure with hypoxia: Secondary | ICD-10-CM | POA: Diagnosis not present

## 2024-02-04 MED ORDER — OMALIZUMAB 150 MG/ML ~~LOC~~ SOSY
PREFILLED_SYRINGE | SUBCUTANEOUS | 2 refills | Status: DC
  Filled 2024-02-04: qty 4, 28d supply, fill #0
  Filled 2024-03-11 (×2): qty 4, 28d supply, fill #1
  Filled 2024-04-01: qty 4, 28d supply, fill #2

## 2024-02-04 MED ORDER — OMALIZUMAB 75 MG/0.5ML ~~LOC~~ SOSY
PREFILLED_SYRINGE | SUBCUTANEOUS | 2 refills | Status: DC
  Filled 2024-02-04: qty 1, 28d supply, fill #0
  Filled 2024-03-11 (×2): qty 1, 28d supply, fill #1
  Filled 2024-04-01: qty 1, 28d supply, fill #2

## 2024-02-04 NOTE — Telephone Encounter (Signed)
 Refill sent for [facility] to [facility]: [phone]   Dose: 375mg  every 14 days  Last OV: 11/01/2023  Provider: Dr. Marchelle Gearing  Next OV: 02/21/2024   Chesley Mires, PharmD, MPH, BCPS Clinical Pharmacist (Rheumatology and Pulmonology)

## 2024-02-12 ENCOUNTER — Other Ambulatory Visit: Payer: Self-pay

## 2024-02-13 ENCOUNTER — Other Ambulatory Visit: Payer: Self-pay

## 2024-02-17 ENCOUNTER — Other Ambulatory Visit (HOSPITAL_COMMUNITY): Payer: Self-pay

## 2024-02-19 ENCOUNTER — Other Ambulatory Visit (HOSPITAL_COMMUNITY): Payer: Self-pay

## 2024-02-21 ENCOUNTER — Encounter: Payer: Self-pay | Admitting: Adult Health

## 2024-02-21 ENCOUNTER — Ambulatory Visit: Payer: BC Managed Care – PPO | Admitting: Adult Health

## 2024-02-21 VITALS — BP 98/60 | HR 76 | Temp 98.0°F | Ht 68.0 in | Wt 103.6 lb

## 2024-02-21 DIAGNOSIS — J4550 Severe persistent asthma, uncomplicated: Secondary | ICD-10-CM

## 2024-02-21 DIAGNOSIS — J479 Bronchiectasis, uncomplicated: Secondary | ICD-10-CM

## 2024-02-21 DIAGNOSIS — E43 Unspecified severe protein-calorie malnutrition: Secondary | ICD-10-CM

## 2024-02-21 DIAGNOSIS — J9611 Chronic respiratory failure with hypoxia: Secondary | ICD-10-CM

## 2024-02-21 DIAGNOSIS — B449 Aspergillosis, unspecified: Secondary | ICD-10-CM

## 2024-02-21 DIAGNOSIS — B4481 Allergic bronchopulmonary aspergillosis: Secondary | ICD-10-CM

## 2024-02-21 DIAGNOSIS — J4489 Other specified chronic obstructive pulmonary disease: Secondary | ICD-10-CM | POA: Diagnosis not present

## 2024-02-21 DIAGNOSIS — A310 Pulmonary mycobacterial infection: Secondary | ICD-10-CM

## 2024-02-21 NOTE — Assessment & Plan Note (Signed)
 Continue follow-up with infectious disease can continue on current regimen with ethambutol and azithromycin.

## 2024-02-21 NOTE — Assessment & Plan Note (Signed)
 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  It would be very difficult for patient to perform any type of occupation due to his level of shortness of breath and medical comorbidities.  He has very poor activity tolerance.  Pulmonary function testing alone should qualify patient for full disability

## 2024-02-21 NOTE — Assessment & Plan Note (Signed)
 ABPA on chronic steroids with prednisone 10 mg daily.  Continue on Fasenra and Xolair.

## 2024-02-21 NOTE — Assessment & Plan Note (Signed)
Continue on oxygen to maintain O2 saturations greater than 88 to 90%. 

## 2024-02-21 NOTE — Assessment & Plan Note (Signed)
 Continue on high-protein diet and protein shakes.

## 2024-02-21 NOTE — Assessment & Plan Note (Signed)
 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  Continue on aggressive mucociliary clearance.
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
## 2024-02-21 NOTE — Assessment & Plan Note (Signed)
 Continue on current regimen with infectious disease with [place]

## 2024-02-21 NOTE — Assessment & Plan Note (Signed)
 Currently appears to be at baseline.  Patient has significant symptom burden at baseline.  Recent PFTs showed very severe airflow obstruction.  Continue on aggressive maintenance regimen with Breztri and saline nebulizers with flutter valve.

## 2024-02-21 NOTE — Patient Instructions (Addendum)
 Continue on BREZTRI 2 puffs Twice daily  , rinse after use.  Prednisone 10mg  daily  Flutter valve Three times a day  .  Please eat often,  High protein food and drinks. -try high calorie boost.  Continue on Fasenra and Xolair .  Continue with ID follow up.  Continue on Oxygen 3l/m with activity and At bedtime, to keep O2 sats >88-90%. -Please wear with activity .  Albuterol inhaler As needed  Wheezing/shortness of breath .  Albuterol neb every 6hrs as needed for wheezing /shortness of breath as needed.  Hypertonic nebs 1-2 twice daily followed by Flutter valve.  Zyrtec 10mg  At bedtime  .  Robitussin DM As needed  Cough/congestion  Boost Twice daily  .  Follow up with dietician as discussed.  Follow up with Dr. Marchelle Gearing 3 months and As needed   Please contact office for sooner follow up if symptoms do not improve or worsen or seek emergency care

## 2024-02-25 ENCOUNTER — Other Ambulatory Visit: Payer: Self-pay | Admitting: Adult Health

## 2024-02-28 ENCOUNTER — Other Ambulatory Visit: Payer: Self-pay

## 2024-03-06 DIAGNOSIS — J9611 Chronic respiratory failure with hypoxia: Secondary | ICD-10-CM | POA: Diagnosis not present

## 2024-03-11 ENCOUNTER — Other Ambulatory Visit: Payer: Self-pay

## 2024-03-11 ENCOUNTER — Other Ambulatory Visit (HOSPITAL_COMMUNITY): Payer: Self-pay

## 2024-03-11 NOTE — Progress Notes (Signed)
 Specialty Pharmacy Refill Coordination Note  [name] is a 48 y.o. male contacted today regarding refills of specialty medication(s) Omalizumab  (XOLAIR )   Patient requested Delivery   Delivery date: 03/13/24   Verified address: [address] [place]  [id number]   Medication will be filled on 03/12/24.

## 2024-03-12 ENCOUNTER — Other Ambulatory Visit (HOSPITAL_COMMUNITY): Payer: Self-pay

## 2024-03-12 ENCOUNTER — Telehealth: Payer: Self-pay

## 2024-03-12 ENCOUNTER — Other Ambulatory Visit: Payer: Self-pay

## 2024-03-12 NOTE — Telephone Encounter (Signed)
 Patient called stating there was an issue filling his Cresemba .   Medication needs PA. PA submitted via covermymeds. Awaiting response.   Key: [id number]  Arlon Bergamo, BSN, RN

## 2024-03-12 NOTE — Telephone Encounter (Signed)
 PA approved.  Approval faxed to pharmacy.

## 2024-03-13 ENCOUNTER — Other Ambulatory Visit: Payer: Self-pay

## 2024-04-01 ENCOUNTER — Other Ambulatory Visit: Payer: Self-pay

## 2024-04-01 ENCOUNTER — Other Ambulatory Visit (HOSPITAL_COMMUNITY): Payer: Self-pay

## 2024-04-01 ENCOUNTER — Other Ambulatory Visit: Payer: Self-pay | Admitting: Pharmacy Technician

## 2024-04-01 ENCOUNTER — Other Ambulatory Visit: Payer: Self-pay | Admitting: Internal Medicine

## 2024-04-01 DIAGNOSIS — J4550 Severe persistent asthma, uncomplicated: Secondary | ICD-10-CM

## 2024-04-01 MED ORDER — FASENRA PEN 30 MG/ML ~~LOC~~ SOAJ
30.0000 mg | SUBCUTANEOUS | 2 refills | Status: DC
  Filled 2024-04-01: qty 1, 56d supply, fill #0
  Filled 2024-06-01 – 2024-06-03 (×2): qty 1, 56d supply, fill #1
  Filled 2024-07-27: qty 1, 56d supply, fill #2

## 2024-04-01 NOTE — Telephone Encounter (Signed)
 Refill sent for FASENRA  to [facility]: [phone]   Dose: 30mg  subcut every 8 weeks  Last OV: 02/21/24 Provider: Dr. Bertrum Brodie  Next OV: 05/26/2024  Geraldene Kleine, PharmD, MPH, BCPS Clinical Pharmacist (Rheumatology and Pulmonology)

## 2024-04-01 NOTE — Progress Notes (Signed)
 Specialty Pharmacy Refill Coordination Note  [name] is a 48 y.o. male contacted today regarding refills of specialty medication(s) Tenofovir  Alafenamide Fumarate (VEMLIDY )   Patient requested Delivery   Delivery date: 04/02/24   Verified address: [address]  [place]   Medication will be filled on 04/01/24.

## 2024-04-01 NOTE — Progress Notes (Signed)
 Specialty Pharmacy Refill Coordination Note  [name] is a 48 y.o. male contacted today regarding refills of specialty medication(s) Benralizumab  (Fasenra  Pen); Omalizumab  (XOLAIR )   Patient requested Delivery   Delivery date: 04/10/24   Verified address: [address] [place]    Medication will be filled on 04/09/24.

## 2024-04-05 DIAGNOSIS — J9611 Chronic respiratory failure with hypoxia: Secondary | ICD-10-CM | POA: Diagnosis not present

## 2024-04-06 ENCOUNTER — Other Ambulatory Visit (HOSPITAL_COMMUNITY): Payer: Self-pay

## 2024-04-06 ENCOUNTER — Other Ambulatory Visit: Payer: Self-pay

## 2024-04-09 ENCOUNTER — Other Ambulatory Visit: Payer: Self-pay

## 2024-04-09 ENCOUNTER — Other Ambulatory Visit (HOSPITAL_COMMUNITY): Payer: Self-pay

## 2024-04-24 ENCOUNTER — Other Ambulatory Visit: Payer: Self-pay

## 2024-04-27 ENCOUNTER — Other Ambulatory Visit: Payer: Self-pay

## 2024-04-27 NOTE — Progress Notes (Signed)
 Specialty Pharmacy Refill Coordination Note  [name] is a 48 y.o. male contacted today regarding refills of specialty medication(s) Tenofovir  Alafenamide Fumarate (VEMLIDY )   Patient requested Delivery   Delivery date: 04/29/24   Verified address: [address] [place]   Medication will be filled on 06.10.25.

## 2024-04-30 ENCOUNTER — Other Ambulatory Visit: Payer: Self-pay

## 2024-04-30 NOTE — Telephone Encounter (Signed)
 [name]

## 2024-05-04 ENCOUNTER — Other Ambulatory Visit: Payer: Self-pay | Admitting: Internal Medicine

## 2024-05-04 ENCOUNTER — Other Ambulatory Visit: Payer: Self-pay

## 2024-05-04 DIAGNOSIS — J4550 Severe persistent asthma, uncomplicated: Secondary | ICD-10-CM

## 2024-05-04 NOTE — Progress Notes (Signed)
 Specialty Pharmacy Refill Coordination Note  [name] is a 48 y.o. male contacted today regarding refills of specialty medication(s) Xolair    Patient requested Delivery   Delivery date: 05/07/24   Verified address: [address] [place]   Medication will be filled on 05/06/24.

## 2024-05-04 NOTE — Progress Notes (Signed)
 Specialty Pharmacy Ongoing Clinical Assessment Note  [name] is a 48 y.o. male who is being followed by the specialty pharmacy service for Multiple active episodes found   Patient's specialty medication(s) reviewed today: Tenofovir  Alafenamide Fumarate (VEMLIDY )   Missed doses in the last 4 weeks: 0   Patient/Caregiver did not have any additional questions or concerns.   Therapeutic benefit summary: Patient is achieving benefit   Adverse events/side effects summary: No adverse events/side effects   Patient's therapy is appropriate to: Continue    Goals Addressed             This Visit's Progress    Achieve sustained HBV viral load suppression   Improving    Patient is on track. Patient will maintain adherence. Patient Hep B level on 10/31/23 was 2,840 I.U/mL      Comply with lab assessments   No change    Patient is not on track and no change. Patient will adhere to provider and/or lab appointments      Maintain optimal adherence to therapy   On track    Patient is on track. Patient will be evaluated at upcoming provider appointment to assess progress         Follow up: 6 months  [facility] Specialty Pharmacist

## 2024-05-05 ENCOUNTER — Other Ambulatory Visit: Payer: Self-pay

## 2024-05-05 MED ORDER — OMALIZUMAB 75 MG/0.5ML ~~LOC~~ SOSY
PREFILLED_SYRINGE | SUBCUTANEOUS | 5 refills | Status: DC
  Filled 2024-05-05: qty 1, 28d supply, fill #0
  Filled 2024-05-25 – 2024-06-03 (×3): qty 1, 28d supply, fill #1
  Filled 2024-06-23 – 2024-06-26 (×2): qty 1, 28d supply, fill #2
  Filled 2024-07-24 – 2024-07-27 (×2): qty 1, 28d supply, fill #3
  Filled 2024-08-20: qty 1, 28d supply, fill #4
  Filled 2024-09-15 – 2024-09-17 (×2): qty 1, 28d supply, fill #5

## 2024-05-05 MED ORDER — OMALIZUMAB 150 MG/ML ~~LOC~~ SOSY
PREFILLED_SYRINGE | SUBCUTANEOUS | 5 refills | Status: DC
  Filled 2024-05-05: qty 4, 28d supply, fill #0
  Filled 2024-05-25 – 2024-06-03 (×3): qty 4, 28d supply, fill #1
  Filled 2024-06-23 – 2024-06-26 (×2): qty 4, 28d supply, fill #2
  Filled 2024-07-24 – 2024-07-27 (×2): qty 4, 28d supply, fill #3
  Filled 2024-08-20: qty 4, 28d supply, fill #4
  Filled 2024-09-15 – 2024-09-17 (×2): qty 4, 28d supply, fill #5

## 2024-05-05 NOTE — Telephone Encounter (Signed)
 Refill sent for XOLAIR  to [facility]: [phone]   Dose: 375mg  subcut every 14 days  Last OV: 02/21/2024 Provider: Dr. Bertrum Brodie  Next OV: 05/26/2024  Geraldene Kleine, PharmD, MPH, BCPS Clinical Pharmacist (Rheumatology and Pulmonology)

## 2024-05-06 DIAGNOSIS — J9611 Chronic respiratory failure with hypoxia: Secondary | ICD-10-CM | POA: Diagnosis not present

## 2024-05-07 ENCOUNTER — Other Ambulatory Visit: Payer: Self-pay

## 2024-05-11 ENCOUNTER — Other Ambulatory Visit (HOSPITAL_COMMUNITY): Payer: Self-pay

## 2024-05-11 ENCOUNTER — Other Ambulatory Visit: Payer: Self-pay

## 2024-05-12 ENCOUNTER — Other Ambulatory Visit: Payer: Self-pay

## 2024-05-20 ENCOUNTER — Other Ambulatory Visit: Payer: Self-pay

## 2024-05-25 ENCOUNTER — Other Ambulatory Visit: Payer: Self-pay

## 2024-05-25 NOTE — Progress Notes (Signed)
 Specialty Pharmacy Refill Coordination Note  [name] is a 48 y.o. male contacted today regarding refills of specialty medication(s) Tenofovir  Alafenamide Fumarate (VEMLIDY )   Patient requested Delivery   Delivery date: 05/27/24   Verified address: [address] [place]   Medication will be filled on 05/26/24.

## 2024-05-25 NOTE — Progress Notes (Signed)
 This encounter was created in error - please disregard.

## 2024-05-25 NOTE — Patient Instructions (Signed)
 Continue on BREZTRI 2 puffs Twice daily  , rinse after use.  Prednisone 10mg  daily  Flutter valve Three times a day  .  Please eat often,  High protein food and drinks. -try high calorie boost.  Continue on Fasenra and Xolair .  Continue with ID follow up.  Continue on Oxygen 3l/m with activity and At bedtime, to keep O2 sats >88-90%. -Please wear with activity .  Albuterol inhaler As needed  Wheezing/shortness of breath .  Albuterol neb every 6hrs as needed for wheezing /shortness of breath as needed.  Hypertonic nebs 1-2 twice daily followed by Flutter valve.  Zyrtec 10mg  At bedtime  .  Robitussin DM As needed  Cough/congestion  Boost Twice daily  .  Follow up with dietician as discussed.  Follow up with Dr. Marchelle Gearing 3 months and As needed   Please contact office for sooner follow up if symptoms do not improve or worsen or seek emergency care

## 2024-05-26 ENCOUNTER — Other Ambulatory Visit: Payer: Self-pay

## 2024-05-26 ENCOUNTER — Encounter: Admitting: Internal Medicine

## 2024-06-01 ENCOUNTER — Other Ambulatory Visit: Payer: Self-pay

## 2024-06-03 ENCOUNTER — Other Ambulatory Visit: Payer: Self-pay

## 2024-06-03 ENCOUNTER — Other Ambulatory Visit: Payer: Self-pay | Admitting: Pharmacy Technician

## 2024-06-03 NOTE — Progress Notes (Signed)
 Specialty Pharmacy Refill Coordination Note  [name] is a 48 y.o. male contacted today regarding refills of specialty medication(s) Benralizumab  (Fasenra  Pen); Omalizumab  (XOLAIR )   Patient requested Delivery   Delivery date: 06/05/24   Verified address: [address]  [place]   Medication will be filled on 06/04/24.

## 2024-06-04 ENCOUNTER — Other Ambulatory Visit: Payer: Self-pay

## 2024-06-04 ENCOUNTER — Other Ambulatory Visit (HOSPITAL_COMMUNITY): Payer: Self-pay

## 2024-06-05 ENCOUNTER — Other Ambulatory Visit: Payer: Self-pay

## 2024-06-05 DIAGNOSIS — J9611 Chronic respiratory failure with hypoxia: Secondary | ICD-10-CM | POA: Diagnosis not present

## 2024-06-11 DIAGNOSIS — E039 Hypothyroidism, unspecified: Secondary | ICD-10-CM | POA: Diagnosis not present

## 2024-06-16 ENCOUNTER — Other Ambulatory Visit: Payer: Self-pay

## 2024-06-23 ENCOUNTER — Other Ambulatory Visit: Payer: Self-pay

## 2024-06-23 NOTE — Progress Notes (Signed)
 Specialty Pharmacy Refill Coordination Note  [name] is a 48 y.o. male contacted today regarding refills of specialty medication(s) Tenofovir  Alafenamide Fumarate (VEMLIDY )   Patient requested Delivery   Delivery date: 06/25/24   Verified address: [address]  [place]   Medication will be filled on 06/24/24.

## 2024-06-25 ENCOUNTER — Other Ambulatory Visit: Payer: Self-pay

## 2024-06-26 ENCOUNTER — Other Ambulatory Visit: Payer: Self-pay | Admitting: Internal Medicine

## 2024-06-26 ENCOUNTER — Other Ambulatory Visit: Payer: Self-pay | Admitting: Pharmacy Technician

## 2024-06-26 ENCOUNTER — Other Ambulatory Visit: Payer: Self-pay

## 2024-06-26 NOTE — Progress Notes (Signed)
 Specialty Pharmacy Refill Coordination Note  [name] is a 48 y.o. male contacted today regarding refills of specialty medication(s) Omalizumab  (XOLAIR )   Patient requested Delivery   Delivery date: 07/03/24   Verified address: [address] [place] [address]   Medication will be filled on 07/02/24.   Patient answered questionnaire on 06/25/24 7:12pm EDT

## 2024-07-02 ENCOUNTER — Other Ambulatory Visit (HOSPITAL_COMMUNITY): Payer: Self-pay

## 2024-07-02 ENCOUNTER — Other Ambulatory Visit: Payer: Self-pay

## 2024-07-03 ENCOUNTER — Other Ambulatory Visit: Payer: Self-pay

## 2024-07-06 DIAGNOSIS — J9611 Chronic respiratory failure with hypoxia: Secondary | ICD-10-CM | POA: Diagnosis not present

## 2024-07-17 ENCOUNTER — Other Ambulatory Visit: Payer: Self-pay

## 2024-07-23 ENCOUNTER — Other Ambulatory Visit (HOSPITAL_COMMUNITY): Payer: Self-pay

## 2024-07-24 ENCOUNTER — Other Ambulatory Visit: Payer: Self-pay

## 2024-07-27 ENCOUNTER — Other Ambulatory Visit: Payer: Self-pay

## 2024-07-27 NOTE — Progress Notes (Signed)
 Specialty Pharmacy Ongoing Clinical Assessment Note  [name] is a 48 y.o. male who is being followed by the specialty pharmacy service for RxSp Asthma/COPD   Patient's specialty medication(s) reviewed today: Omalizumab  (XOLAIR ); Benralizumab  (Fasenra  Pen)   Missed doses in the last 4 weeks: 0   Patient/Caregiver did not have any additional questions or concerns.   Therapeutic benefit summary: Patient is achieving benefit   Adverse events/side effects summary: No adverse events/side effects   Patient's therapy is appropriate to: Continue    Goals Addressed             This Visit's Progress    Reduce disease symptoms including coughing and shortness of breath   On track    Patient is on track. Patient will maintain adherence. Patient reports that he has good days and bad days. Still has some coughing and SOB but able to control with the help of inhalers.         Follow up: 12 months  [facility]

## 2024-07-27 NOTE — Progress Notes (Signed)
 Specialty Pharmacy Refill Coordination Note  [name] is a 48 y.o. male contacted today regarding refills of specialty medication(s) Tenofovir  Alafenamide Fumarate (VEMLIDY )   Patient requested Delivery   Delivery date: 07/29/24   Verified address: [address] [place] [address]   Medication will be filled on 07/28/24.

## 2024-07-27 NOTE — Progress Notes (Signed)
 Specialty Pharmacy Refill Coordination Note  [name] is a 48 y.o. male contacted today regarding refills of specialty medication(s) Benralizumab  (Fasenra  Pen); Omalizumab  (XOLAIR )   Patient requested Delivery   Delivery date: 07/29/24   Verified address: [address] [place] [address]   Medication will be filled on 07/28/24.

## 2024-07-28 ENCOUNTER — Other Ambulatory Visit: Payer: Self-pay

## 2024-07-30 ENCOUNTER — Other Ambulatory Visit: Payer: Self-pay

## 2024-07-30 ENCOUNTER — Other Ambulatory Visit (HOSPITAL_COMMUNITY): Payer: Self-pay

## 2024-07-31 ENCOUNTER — Other Ambulatory Visit: Payer: Self-pay | Admitting: Internal Medicine

## 2024-07-31 ENCOUNTER — Other Ambulatory Visit: Payer: Self-pay

## 2024-08-06 DIAGNOSIS — J9611 Chronic respiratory failure with hypoxia: Secondary | ICD-10-CM | POA: Diagnosis not present

## 2024-08-11 ENCOUNTER — Ambulatory Visit: Admitting: Internal Medicine

## 2024-08-20 ENCOUNTER — Other Ambulatory Visit: Payer: Self-pay

## 2024-08-20 NOTE — Progress Notes (Signed)
 Specialty Pharmacy Refill Coordination Note  [name] is a 48 y.o. male contacted today regarding refills of specialty medication(s) Omalizumab  (XOLAIR )   Patient requested Delivery   Delivery date: 08/25/24   Verified address: [address] [place] [address]   Medication will be filled on 10.06.25.

## 2024-08-20 NOTE — Progress Notes (Signed)
 Specialty Pharmacy Refill Coordination Note  [name] is a 48 y.o. male contacted today regarding refills of specialty medication(s) Tenofovir  Alafenamide Fumarate (VEMLIDY )   Patient requested Delivery   Delivery date: 08/25/24   Verified address: [address] [name] [address]   Medication will be filled on 10.06.25.

## 2024-08-21 ENCOUNTER — Other Ambulatory Visit: Payer: Self-pay

## 2024-08-28 ENCOUNTER — Other Ambulatory Visit: Payer: Self-pay

## 2024-09-09 ENCOUNTER — Telehealth: Payer: Self-pay

## 2024-09-09 NOTE — Telephone Encounter (Signed)
 Received call from Dr. Maree with [facility] requesting to speak with Dr. 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 Secure chat sent to Dr. Fleeta Rothman with Dr. Jamal cell.   Per Dr. Fleeta Rothman:  lets have him stop azithromycin , and his ethambutol  and have him come in and meet with pharmacy to try to swap in clofazamine from [facility] + azithro   Spoke with [name], discussed provider's concerns. He will stop azithromycin  and ethambutol . He accepts appointment with pharmacy team tomorrow to discuss alternative regimen.   [name], BSN, RN

## 2024-09-09 NOTE — Progress Notes (Unsigned)
 NEW REFERRAL TO CPP CLINIC       HPI: [name] is a 48 y.o. male who presents to the [facility] pharmacy clinic for MAC follow-up.   Referring ID Physician: Dr. 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/14/2022   MSSA (methicillin susceptible Staphylococcus aureus) pneumonia (HCC) 03/14/2022   Underweight, BMI < 18.5% 03/14/2022   Acute respiratory failure (HCC)    Acute encephalopathy    Rib fracture 02/08/2021   Chronic respiratory failure with hypoxia (HCC) 02/06/2021   Bronchiectasis (HCC) 12/12/2020   Chronic viral hepatitis B without delta-agent (HCC) 11/23/2020   Protein-calorie malnutrition, severe 09/05/2018   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 [facility]) 06/17/2018   GERD (gastroesophageal reflux disease) 08/09/2017   Hypothyroidism 08/09/2017   Iron deficiency anemia 08/09/2017   Weight loss 10/15/2016   Severe persistent asthma (HCC) 01/16/2016   Mold exposure 12/05/2015   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   Massive hemoptysis, recurrent    Mycobacterium avium-intracellulare complex (HCC)    Dyspnea 05/24/2014   Osteoporosis 03/10/2013   Osteoporosis 09/22/2012   Asthmatic bronchitis , chronic (HCC) 01/30/2012   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/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   BENRALIZUMAB  (FASENRA  PEN) 30 MG/ML PREFILLED AUTOINJECTOR    Inject 1 mL (30 mg total) into the skin every 8 (eight) weeks.   BREZTRI  AEROSPHERE 160-9-4.8 MCG/ACT AERO INHALER    INHALE 2 PUFFS INTO THE LUNGS IN THE MORNING AND AT BEDTIME   CALCIUM  CARB-CHOLECALCIFEROL 600-10 MG-MCG TABS    Take 1 tablet by mouth daily.   EPINEPHRINE  0.3 MG/0.3 ML IJ SOAJ INJECTION    ADMINISTER 0.3 MG IN THE MUSCLE 1 TIME FOR 1 DOSE   ETHAMBUTOL  (MYAMBUTOL ) 400 MG TABLET    Take 2.5 tablets (1,000 mg total) by mouth daily.   FEEDING SUPPLEMENT, ENSURE ENLIVE, (ENSURE ENLIVE) LIQD    Take 237 mLs by mouth 3 (three) times daily between meals.   FERROUS SULFATE  325 (65 FE) MG TABLET    Take 325 mg by mouth every morning.   FOLIC ACID  (FOLVITE ) 1 MG TABLET    Take 1 mg by mouth every other day.   ISAVUCONAZONIUM SULFATE  (CRESEMBA ) 186 MG CAPS    TAKE 2 CAPSULES BY MOUTH DAILY   MENTHOL-METHYL SALICYLATE (SALONPAS PAIN RELIEF PATCH EX)    Place 1-2 patches onto the skin daily. Back pain   MIRTAZAPINE  (REMERON  SOL-TAB) 45 MG DISINTEGRATING TABLET    Take 45 mg by mouth at bedtime.   OMALIZUMAB  (XOLAIR ) 150 MG/ML PREFILLED SYRINGE    INJECT 300 MG INTO THE SKIN EVERY 14 (FOURTEEN) DAYS. IN ADDITION TO 75MG  (TOTAL DOSE IS 375MG  EVERY 14 DAYS).   OMALIZUMAB  (XOLAIR ) 75 MG/0.5ML PREFILLED SYRINGE  INJECT 75 MG INTO THE SKIN EVERY 14 (FOURTEEN) DAYS. IN ADDITION TO 300MG  (TOTAL DOSE IS 375MG  EVERY 14 DAYS). DELIVER TO PATIENT'S HOME.   OXYCODONE  (ROXICODONE ) 5 MG IMMEDIATE RELEASE TABLET    Take 1 tablet (5 mg total) by mouth every 12 (twelve) hours as needed for severe pain (pain score 7-10).   OXYGEN     Inhale 3 L into the lungs as needed (shortness of breath).   POLYETHYLENE GLYCOL POWDER (MIRALAX ) 17 GM/SCOOP POWDER    Take 255 g by mouth daily as needed.   PREDNISONE  (DELTASONE ) 10 MG TABLET    TAKE 1 TABLET(10 MG) BY MOUTH DAILY WITH BREAKFAST   SODIUM CHLORIDE   HYPERTONIC 3 % NEBULIZER SOLUTION    USE NEBULIZEER DAILY AS DIRECTED   TENOFOVIR  ALAFENAMIDE (VEMLIDY ) 25 MG TABLET    Take 1 tablet (25 mg total) by mouth daily.   TIROSINT -SOL 88 MCG/ML SOLN    Take by mouth.  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   CAP (community acquired pneumonia) 03/09/2022   Chronic viral hepatitis B without delta-agent (HCC) 11/23/2020   Drug rash 12/16/2017   Dyspnea    Early satiety 01/16/2023   Esophageal reflux    very rare   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   Lung disease, bullous (HCC)    MAI (mycobacterium avium-intracellulare) (HCC)    Mold exposure 12/05/2015   Photosensitivity dermatitis 09/16/2017   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/14/2022   Rib fracture 02/08/2021   Solar lentigo 08/08/2015   Tension pneumothorax 09/04/2018   Vaccine counseling 12/28/2020   Weight loss 10/15/2016    Social History: Social History   Socioeconomic History   Marital status: Single    Spouse name: Not on file   Number of children: Not on file   Years of education: Not on file   Highest education level: Not on file  Occupational History   Occupation: works 2 jobs  Tobacco Use   Smoking status: Former    Current packs/day: 0.00    Average packs/day: 0.7 packs/day for 19.0 years (13.3 ttl pk-yrs)    Types: Cigarettes    Start date: 11/19/1988    Quit date: 11/20/2007    Years since quitting: 16.8   Smokeless tobacco: Former    Types: Snuff    Quit date: 11/19/1994  Vaping Use   Vaping status: Never Used  Substance and  Sexual Activity   Alcohol use: No   Drug use: No   Sexual activity: Not on file  Other Topics Concern   Not on file  Social History Narrative   Pt is adopted   Patient works in Therapist, occupational   Social Drivers of Corporate investment banker Strain: Not on file  Food Insecurity: Low Risk  (06/11/2024)   Received from [facility]   Hunger Vital Sign    Within the past 12 months, you worried that your food would run out before you got money to buy more: Never true    Within the past 12 months,  the food you bought just didn't last and you didn't have money to get more. : Never true  Transportation Needs: No Transportation Needs (06/11/2024)   Received from [facility]   Transportation    In the past 12 months, has lack of reliable transportation kept you from medical appointments, meetings, work or from getting things needed for daily living? : No  Physical Activity: Not on file  Stress: Not on file  Social Connections: Unknown (04/02/2022)   Received from [facility]   Social Network    Social Network: Not on file    Labs: Lab Results  Component Value Date   HIV1RNAQUANT <20 05/28/2011   CD4TABS 1520 05/28/2011    RPR and STI Lab Results  Component Value Date   LABRPR NON REAC 05/28/2011        No data to display          Hepatitis B No results found for: HEPBSAB, HEPBSAG, HEPBCAB Hepatitis C No results found for: HEPCAB, HCVRNAPCRQN Hepatitis A No results found for: HAV Lipids: Lab Results  Component Value Date   CHOL 163 05/28/2011   TRIG 49 03/11/2022   HDL 92 05/28/2011   CHOLHDL 1.8 05/28/2011   VLDL 15 05/28/2011   LDLCALC 56 05/28/2011    Current MAC Regimen: Azithromycin  500 mg daily + ethambutol  1000 mg daily   Assessment: Petros presents to clinic today for MAC follow-up after experiencing vision loss likely from ethambutol . He has been taking azithromycin  and ethambutol  for several years for pulmonary MAC management. Per Dr. Fleeta Rothman, although his cultures remain negative, will continue treatment as patient develops hemoptysis and hemolysis every time treatment is discontinued. Ophthalmology reached out to Dr. Fleeta Rothman yesterday believing ethambutol  is causing vision loss. Per his weight of 47 kg, 1000 mg is somewhat supratherapeutic as 15 mg/kg daily is only 700 mg daily. Has discontinued both ethambutol  and azithromycin  as of yesterday per Dr. Fleeta Dam's recommendations.  Will transition to azithromycin  and clofazimine. Submitted application yesterday and will await approval and medication shipment in ~1-2 weeks. Patient will continue holding treatment in the meantime. Will return to clinic in ~2 weeks to pick up medication and restart combination. Medication dispense report shows very consistent fill history.   Also continues on Cresemba  daily for history of aspergilloma and skin toxicity with voriconazole . Has been tolerating this well without any issues. Takes Vemlidy  daily for HBV infection; will repeat HBV DNA along with CMP and CBC today as last were assessed in December. Will sent refills for *** today. Schedule follow up with Dr. Fleeta Rothman in 4 months.   Eligible for flu, COVID, and Shingles vaccines today; ***.   Plan: - STOP azithromycin  and ethambutol  - Start azithromycin  and clofazimine (once clofazimine application approved) - Check HBV DNA, CMP, and CBC  - Follow up with pharmacy on *** to pick up clofazimine - Follow up with Dr. Fleeta Rothman on ***   Alan Geralds, PharmD, CPP, BCIDP, AAHIVP Clinical Pharmacist Practitioner Infectious Diseases Clinical Pharmacist [facility] 09/09/2024, 4:08 PM

## 2024-09-10 ENCOUNTER — Other Ambulatory Visit: Payer: Self-pay

## 2024-09-10 ENCOUNTER — Ambulatory Visit: Admitting: Pharmacist

## 2024-09-10 ENCOUNTER — Other Ambulatory Visit (HOSPITAL_COMMUNITY): Payer: Self-pay

## 2024-09-10 DIAGNOSIS — A310 Pulmonary mycobacterial infection: Secondary | ICD-10-CM

## 2024-09-10 DIAGNOSIS — B181 Chronic viral hepatitis B without delta-agent: Secondary | ICD-10-CM

## 2024-09-10 DIAGNOSIS — Z23 Encounter for immunization: Secondary | ICD-10-CM | POA: Diagnosis not present

## 2024-09-10 DIAGNOSIS — B449 Aspergillosis, unspecified: Secondary | ICD-10-CM

## 2024-09-10 MED ORDER — CRESEMBA 186 MG PO CAPS
2.0000 | ORAL_CAPSULE | Freq: Every day | ORAL | 11 refills | Status: AC
  Filled 2024-09-10: qty 70, 35d supply, fill #0
  Filled 2024-10-02 – 2024-10-05 (×2): qty 56, 28d supply, fill #0
  Filled 2024-11-02: qty 56, 28d supply, fill #1

## 2024-09-10 MED ORDER — TENOFOVIR ALAFENAMIDE FUMARATE 25 MG PO TABS
25.0000 mg | ORAL_TABLET | Freq: Every day | ORAL | 11 refills | Status: AC
  Filled 2024-09-10 – 2024-09-21 (×2): qty 30, 30d supply, fill #0
  Filled 2024-10-16 – 2024-10-21 (×2): qty 30, 30d supply, fill #1
  Filled 2024-11-17: qty 30, 30d supply, fill #2

## 2024-09-10 MED ORDER — AZITHROMYCIN 500 MG PO TABS
500.0000 mg | ORAL_TABLET | Freq: Every day | ORAL | 11 refills | Status: AC
  Filled 2024-09-10: qty 30, 30d supply, fill #0

## 2024-09-11 ENCOUNTER — Other Ambulatory Visit (HOSPITAL_COMMUNITY): Payer: Self-pay

## 2024-09-12 LAB — CBC
HCT: 41.3 % (ref 38.5–50.0)
Hemoglobin: 13.6 g/dL (ref 13.2–17.1)
MCH: 32.4 pg (ref 27.0–33.0)
MCHC: 32.9 g/dL (ref 32.0–36.0)
MCV: 98.3 fL (ref 80.0–100.0)
MPV: 9.3 fL (ref 7.5–12.5)
Platelets: 292 Thousand/uL (ref 140–400)
RBC: 4.2 Million/uL (ref 4.20–5.80)
RDW: 13.1 % (ref 11.0–15.0)
WBC: 12.3 Thousand/uL — ABNORMAL HIGH (ref 3.8–10.8)

## 2024-09-12 LAB — COMPREHENSIVE METABOLIC PANEL WITH GFR
AG Ratio: 1.1 (calc) (ref 1.0–2.5)
ALT: 33 U/L (ref 9–46)
AST: 47 U/L — ABNORMAL HIGH (ref 10–35)
Albumin: 3.9 g/dL (ref 3.6–5.1)
Alkaline phosphatase (APISO): 182 U/L — ABNORMAL HIGH (ref 35–144)
BUN/Creatinine Ratio: 24 (calc) — ABNORMAL HIGH (ref 6–22)
BUN: 12 mg/dL (ref 7–25)
CO2: 37 mmol/L — ABNORMAL HIGH (ref 20–32)
Calcium: 9.3 mg/dL (ref 8.6–10.3)
Chloride: 94 mmol/L — ABNORMAL LOW (ref 98–110)
Creat: 0.49 mg/dL — ABNORMAL LOW (ref 0.70–1.30)
Globulin: 3.5 g/dL (ref 1.9–3.7)
Glucose, Bld: 76 mg/dL (ref 65–99)
Potassium: 4.5 mmol/L (ref 3.5–5.3)
Sodium: 138 mmol/L (ref 135–146)
Total Bilirubin: 0.5 mg/dL (ref 0.2–1.2)
Total Protein: 7.4 g/dL (ref 6.1–8.1)
eGFR: 125 mL/min/1.73m2 (ref >=60)

## 2024-09-12 LAB — HEPATITIS B DNA, ULTRAQUANTITATIVE, PCR
Hepatitis B DNA: <10 [IU]/mL — AB
Hepatitis B virus DNA: <1 {Log_IU}/mL — AB

## 2024-09-14 ENCOUNTER — Other Ambulatory Visit: Payer: Self-pay

## 2024-09-15 ENCOUNTER — Other Ambulatory Visit: Payer: Self-pay | Admitting: Internal Medicine

## 2024-09-15 ENCOUNTER — Other Ambulatory Visit: Payer: Self-pay

## 2024-09-15 ENCOUNTER — Other Ambulatory Visit (HOSPITAL_COMMUNITY): Payer: Self-pay

## 2024-09-15 DIAGNOSIS — J4550 Severe persistent asthma, uncomplicated: Secondary | ICD-10-CM

## 2024-09-15 MED ORDER — FASENRA PEN 30 MG/ML ~~LOC~~ SOAJ
30.0000 mg | SUBCUTANEOUS | 2 refills | Status: AC
  Filled 2024-09-15 – 2024-09-17 (×2): qty 1, 56d supply, fill #0
  Filled 2024-11-09: qty 1, 56d supply, fill #1

## 2024-09-15 NOTE — Telephone Encounter (Signed)
 Refill sent for FASENRA  to [facility]: [phone]   Dose: 30mg  [place] every 8 weeks  Last OV: 02/21/24 Provider: Dr. Geronimo   Next OV: 09/24/24  Aleck Puls, PharmD, BCPS Clinical Pharmacist  [facility]

## 2024-09-15 NOTE — Telephone Encounter (Signed)
 Pt requesting refill of specialty medication - routing to Rx team to advise.

## 2024-09-17 ENCOUNTER — Other Ambulatory Visit: Payer: Self-pay

## 2024-09-17 ENCOUNTER — Other Ambulatory Visit (HOSPITAL_COMMUNITY): Payer: Self-pay

## 2024-09-21 ENCOUNTER — Other Ambulatory Visit: Payer: Self-pay

## 2024-09-21 NOTE — Progress Notes (Signed)
 Specialty Pharmacy Refill Coordination Note  [name] is a 48 y.o. male contacted today regarding refills of specialty medication(s) Tenofovir  Alafenamide Fumarate (VEMLIDY )   Patient requested Delivery   Delivery date: 09/25/24   Verified address: [address] [place] [address]   Medication will be filled on: 09/24/24

## 2024-09-21 NOTE — Progress Notes (Addendum)
 Specialty Pharmacy Refill Coordination Note  [name] is a 48 y.o. male contacted today regarding refills of specialty medication(s) Benralizumab  (Fasenra  Pen); Omalizumab  (XOLAIR )   Patient requested Delivery   Delivery date: 09/25/24  Verified address: [address] [place] [address]   Medication will be filled on: 09/24/24

## 2024-09-22 ENCOUNTER — Telehealth: Payer: Self-pay | Admitting: Pharmacist

## 2024-09-22 NOTE — Telephone Encounter (Signed)
 [employer] team responded to initial clofazimine request on 10/28 stating positive cultures would be necessary to support approval. Additionally, the team discussed concern around risk for QTc prolongation with voriconazle and azithromycin .   Discussed decision with Dr. Fleeta Rothman who has agreed to monitor patient off ethambutol  and azithromycin  for now; he will still follow up with Dr. Fleeta Rothman at the end of January and reassess need for treatment at that time. As of our phone call today, patient states his symptoms have not significantly changed since stopping azithromycin  and ethambutol .  Unsurprisingly, his vision has not drastically adjusted since stopping ethambutol , but he states he has slightly less blurry vision. He is on the wait list to consult with neuro-ophthalmology hopefully in the beginning of 2026.   Alan Geralds, PharmD, CPP, BCIDP, AAHIVP Clinical Pharmacist Practitioner Infectious Diseases Clinical Pharmacist [facility]

## 2024-09-23 ENCOUNTER — Telehealth: Payer: Self-pay

## 2024-09-23 ENCOUNTER — Telehealth: Payer: Self-pay | Admitting: *Deleted

## 2024-09-23 ENCOUNTER — Other Ambulatory Visit: Payer: Self-pay

## 2024-09-23 NOTE — Telephone Encounter (Signed)
 Patient called clinic back this morning requesting additional explanation of holding therapy at this time. He expressed understanding and also asked about alternative options such as rifampin, rifabutin, restarting ethambutol , and clarithromycin. Explained that rifampin and rifabutin are contraindicated with Cresemba  due to drug interactions. We could always consider restarting ethambutol  at a lower dose in the future if needed but discussed there would always be the possibility of worsening vision. Also explained that clarithromycin is in the same class as azithromycin  and would not be used together. He understands to watch his symptoms and to remain off of medications at this time.  Alan Geralds, PharmD, CPP, BCIDP, AAHIVP Clinical Pharmacist Practitioner Infectious Diseases Clinical Pharmacist [facility]

## 2024-09-23 NOTE — Telephone Encounter (Signed)
 Copied from CRM #[id number]. Topic: Clinical - Medical Advice >> Sep 23, 2024  8:53 AM Essie A wrote: Reason for CRM: Patient would like for Tammy Parrett to personally call him.  He has some medical questions to ask.  His phone number is [phone].  Thanks.  I called and spoke with the pt  He wanted Tammy to know that the reason he cancelled his appt with her today He has been having a decline in his vision, that he relates to taking respiratory meds  He has appt with eye specialist but not until 2 months from now  He wonders if Tammy could call and talk to him about going on disability

## 2024-09-23 NOTE — Telephone Encounter (Signed)
 Patient called related to additional medication questions he had for pharmacy team. Call routed to pharmacy as requested.  Enis Kleine, LPN

## 2024-09-24 ENCOUNTER — Ambulatory Visit: Admitting: Adult Health

## 2024-09-25 NOTE — Telephone Encounter (Signed)
 Spoke with patient, he is going to reschedule once he has transportation and is able to come in .

## 2024-10-02 ENCOUNTER — Other Ambulatory Visit: Payer: Self-pay

## 2024-10-02 ENCOUNTER — Telehealth (HOSPITAL_COMMUNITY): Payer: Self-pay | Admitting: Pharmacist

## 2024-10-02 ENCOUNTER — Other Ambulatory Visit (HOSPITAL_COMMUNITY): Payer: Self-pay

## 2024-10-05 ENCOUNTER — Other Ambulatory Visit (HOSPITAL_COMMUNITY): Payer: Self-pay

## 2024-10-05 ENCOUNTER — Telehealth: Payer: Self-pay

## 2024-10-05 ENCOUNTER — Other Ambulatory Visit: Payer: Self-pay

## 2024-10-05 NOTE — Telephone Encounter (Signed)
 Submitted a Prior Authorization request to [facility] for Cresemba  via Latent. Will update once we receive a response.  J Code: CPT:  PA ID:  [id number]

## 2024-10-06 ENCOUNTER — Other Ambulatory Visit (HOSPITAL_COMMUNITY): Payer: Self-pay

## 2024-10-06 ENCOUNTER — Telehealth: Payer: Self-pay

## 2024-10-06 ENCOUNTER — Other Ambulatory Visit: Payer: Self-pay

## 2024-10-06 NOTE — Telephone Encounter (Signed)
 Received notification from [facility] regarding a prior authorization for CRESEMBA . Authorization has been APPROVED from 10/05/24 to 04/03/25.   Per test claim, copay for 28 days supply is $0.00  Patient can continue to fill through [facility]: [phone]   Authorization # [id number] Phone # [phone]

## 2024-10-16 ENCOUNTER — Other Ambulatory Visit: Payer: Self-pay

## 2024-10-16 ENCOUNTER — Other Ambulatory Visit: Payer: Self-pay | Admitting: Internal Medicine

## 2024-10-16 ENCOUNTER — Other Ambulatory Visit (HOSPITAL_COMMUNITY): Payer: Self-pay

## 2024-10-16 DIAGNOSIS — J4550 Severe persistent asthma, uncomplicated: Secondary | ICD-10-CM

## 2024-10-19 ENCOUNTER — Other Ambulatory Visit: Payer: Self-pay

## 2024-10-19 ENCOUNTER — Other Ambulatory Visit: Payer: Self-pay | Admitting: Internal Medicine

## 2024-10-19 DIAGNOSIS — J4550 Severe persistent asthma, uncomplicated: Secondary | ICD-10-CM

## 2024-10-20 ENCOUNTER — Other Ambulatory Visit (HOSPITAL_COMMUNITY): Payer: Self-pay

## 2024-10-20 ENCOUNTER — Other Ambulatory Visit: Payer: Self-pay

## 2024-10-20 MED ORDER — OMALIZUMAB 150 MG/ML ~~LOC~~ SOSY
PREFILLED_SYRINGE | SUBCUTANEOUS | 2 refills | Status: AC
  Filled 2024-10-20: qty 4, fill #0
  Filled 2024-10-21: qty 4, 28d supply, fill #0
  Filled 2024-11-16: qty 4, 28d supply, fill #1

## 2024-10-20 MED ORDER — OMALIZUMAB 75 MG/0.5ML ~~LOC~~ SOSY
PREFILLED_SYRINGE | SUBCUTANEOUS | 2 refills | Status: AC
  Filled 2024-10-20: qty 1, fill #0
  Filled 2024-10-21 (×2): qty 1, 28d supply, fill #0
  Filled 2024-11-16: qty 1, 28d supply, fill #1

## 2024-10-20 NOTE — Telephone Encounter (Signed)
 Refill sent for XOLAIR  to [facility]: [phone]   Dose: 375mg  [place] every 14 days   Last OV: 02/21/24 Provider: Dr. Geronimo   Next OV: overdue -   Routing to scheduling team for follow-up on appt scheduling  [name], PharmD, BCPS Clinical Pharmacist  [facility]

## 2024-10-21 ENCOUNTER — Other Ambulatory Visit: Payer: Self-pay

## 2024-10-21 ENCOUNTER — Other Ambulatory Visit (HOSPITAL_COMMUNITY): Payer: Self-pay

## 2024-10-23 ENCOUNTER — Other Ambulatory Visit: Payer: Self-pay

## 2024-10-23 NOTE — Progress Notes (Signed)
 Specialty Pharmacy Refill Coordination Note  [name] is a 48 y.o. male contacted today regarding refills of specialty medication(s) Omalizumab  (XOLAIR )   Patient requested Delivery   Delivery date: 10/27/24   Verified address: [address] [place] [address]   Medication will be filled on: 10/26/24

## 2024-10-23 NOTE — Progress Notes (Signed)
 Specialty Pharmacy Refill Coordination Note  [name] is a 48 y.o. male contacted today regarding refills of specialty medication(s) Tenofovir  Alafenamide Fumarate (VEMLIDY )   Patient requested Delivery   Delivery date: 10/27/24   Verified address: [address] [place] [address]   Medication will be filled on: 10/27/24

## 2024-10-26 ENCOUNTER — Other Ambulatory Visit: Payer: Self-pay

## 2024-10-26 NOTE — Progress Notes (Signed)
 Specialty Pharmacy Ongoing Clinical Assessment Note  [name] is a 48 y.o. male who is being followed by the specialty pharmacy service for RxSp Hepatitis B   Patient's specialty medication(s) reviewed today: Tenofovir  Alafenamide Fumarate (VEMLIDY )   Missed doses in the last 4 weeks: 0   Patient/Caregiver did not have any additional questions or concerns.   Therapeutic benefit summary: Patient is achieving benefit   Adverse events/side effects summary: No adverse events/side effects   Patient's therapy is appropriate to: Continue    Goals Addressed             This Visit's Progress    Achieve sustained HBV viral load suppression   On track    Patient is on track. Patient will maintain adherence. Patient Hep B level on 09/10/24 was <10 international unit /mL.         Follow up: 6 months  Silvano LOISE Dolly Specialty Pharmacist

## 2024-10-31 ENCOUNTER — Other Ambulatory Visit: Payer: Self-pay | Admitting: Adult Health

## 2024-11-02 ENCOUNTER — Other Ambulatory Visit (HOSPITAL_COMMUNITY): Payer: Self-pay

## 2024-11-02 ENCOUNTER — Other Ambulatory Visit: Payer: Self-pay | Admitting: Adult Health

## 2024-11-02 ENCOUNTER — Other Ambulatory Visit: Payer: Self-pay

## 2024-11-02 NOTE — Telephone Encounter (Unsigned)
 Copied from CRM #[id number]. Topic: Clinical - Medication Refill >> Nov 02, 2024 12:34 PM Devaughn S wrote: Medication: albuterol  (PROVENTIL ) (2.5 MG/3ML) 0.083% nebulizer solution  Has the patient contacted their pharmacy? Yes (Agent: If no, request that the patient contact the pharmacy for the refill. If patient does not wish to contact the pharmacy document the reason why and proceed with request.) (Agent: If yes, when and what did the pharmacy advise?)  This is the patient's preferred pharmacy:  [id number] - [place], [place] - [address] AT [facility] [address] [place] [address] Phone: [phone] Fax: [phone]   Is this the correct pharmacy for this prescription? Yes If no, delete pharmacy and type the correct one.   Has the prescription been filled recently? No  Is the patient out of the medication? No, less then 1 week left  Has the patient been seen for an appointment in the last year OR does the patient have an upcoming appointment? Yes  Can we respond through MyChart? No  Agent: Please be advised that Rx refills may take up to 3 business days. We ask that you follow-up with your pharmacy.

## 2024-11-03 ENCOUNTER — Other Ambulatory Visit: Payer: Self-pay

## 2024-11-05 ENCOUNTER — Other Ambulatory Visit: Payer: Self-pay | Admitting: Adult Health

## 2024-11-05 ENCOUNTER — Other Ambulatory Visit: Payer: Self-pay | Admitting: Infectious Disease

## 2024-11-05 DIAGNOSIS — B449 Aspergillosis, unspecified: Secondary | ICD-10-CM

## 2024-11-05 NOTE — Telephone Encounter (Signed)
 Ethambutol  was stopped at 09/10/24 visit.   Ade Stmarie, BSN, RN

## 2024-11-06 MED ORDER — ALBUTEROL SULFATE (2.5 MG/3ML) 0.083% IN NEBU: 2.5000 mg | INHALATION_SOLUTION | RESPIRATORY_TRACT | 3 refills | Status: AC | PRN

## 2024-11-06 NOTE — Telephone Encounter (Signed)
 Copied from CRM #[id number]. Topic: Clinical - Medication Question >> Nov 05, 2024  2:02 PM Devaughn RAMAN wrote: Reason for CRM: Pt called to inquire on the status of : albuterol  (PROVENTIL ) (2.5 MG/3ML) 0.083% nebulizer solution medication, advised pt Tammy is currently working on this, pt was thankful and verbalized understanding. >> Nov 06, 2024 10:31 AM Corean SAUNDERS wrote: Patient is requesting Tammy Parretts nurse to call him back as soon as possible as is medication refill request was denied.    Rx refilled & pt is aware. Nothing further needed

## 2024-11-09 ENCOUNTER — Other Ambulatory Visit: Payer: Self-pay

## 2024-11-13 ENCOUNTER — Other Ambulatory Visit (HOSPITAL_COMMUNITY): Payer: Self-pay

## 2024-11-16 ENCOUNTER — Other Ambulatory Visit (HOSPITAL_COMMUNITY): Payer: Self-pay

## 2024-11-17 ENCOUNTER — Other Ambulatory Visit (HOSPITAL_COMMUNITY): Payer: Self-pay

## 2024-11-18 ENCOUNTER — Other Ambulatory Visit: Payer: Self-pay

## 2024-11-18 NOTE — Progress Notes (Signed)
 Specialty Pharmacy Refill Coordination Note  [name] is a 48 y.o. male contacted today regarding refills of specialty medication(s) Benralizumab  (Fasenra  Pen); Omalizumab  (XOLAIR )   Patient requested Delivery   Delivery date: 11/24/24   Verified address: [address] [place] [address]   Medication will be filled on: 11/23/24

## 2024-11-18 NOTE — Progress Notes (Signed)
 Specialty Pharmacy Refill Coordination Note  [name] is a 48 y.o. male contacted today regarding refills of specialty medication(s) Tenofovir  Alafenamide Fumarate (VEMLIDY )   Patient requested Delivery   Delivery date: 11/24/24   Verified address: [address] [place] [address]   Medication will be filled on: 11/23/24

## 2024-11-19 ENCOUNTER — Emergency Department (HOSPITAL_COMMUNITY)

## 2024-11-19 DIAGNOSIS — J9601 Acute respiratory failure with hypoxia: Secondary | ICD-10-CM | POA: Diagnosis not present

## 2024-11-19 DIAGNOSIS — E871 Hypo-osmolality and hyponatremia: Secondary | ICD-10-CM | POA: Diagnosis present

## 2024-11-19 DIAGNOSIS — E875 Hyperkalemia: Secondary | ICD-10-CM | POA: Diagnosis not present

## 2024-11-19 DIAGNOSIS — Z1152 Encounter for screening for COVID-19: Secondary | ICD-10-CM

## 2024-11-19 DIAGNOSIS — R739 Hyperglycemia, unspecified: Secondary | ICD-10-CM | POA: Diagnosis present

## 2024-11-19 DIAGNOSIS — R6521 Severe sepsis with septic shock: Secondary | ICD-10-CM | POA: Diagnosis present

## 2024-11-19 DIAGNOSIS — X58XXXA Exposure to other specified factors, initial encounter: Secondary | ICD-10-CM | POA: Diagnosis not present

## 2024-11-19 DIAGNOSIS — J8410 Pulmonary fibrosis, unspecified: Secondary | ICD-10-CM | POA: Diagnosis not present

## 2024-11-19 DIAGNOSIS — L89156 Pressure-induced deep tissue damage of sacral region: Secondary | ICD-10-CM | POA: Diagnosis not present

## 2024-11-19 DIAGNOSIS — Z8701 Personal history of pneumonia (recurrent): Secondary | ICD-10-CM

## 2024-11-19 DIAGNOSIS — D649 Anemia, unspecified: Secondary | ICD-10-CM | POA: Diagnosis present

## 2024-11-19 DIAGNOSIS — Z9911 Dependence on respirator [ventilator] status: Secondary | ICD-10-CM

## 2024-11-19 DIAGNOSIS — Y249XXA Unspecified firearm discharge, undetermined intent, initial encounter: Secondary | ICD-10-CM | POA: Diagnosis present

## 2024-11-19 DIAGNOSIS — E876 Hypokalemia: Secondary | ICD-10-CM | POA: Diagnosis not present

## 2024-11-19 DIAGNOSIS — J439 Emphysema, unspecified: Secondary | ICD-10-CM | POA: Diagnosis present

## 2024-11-19 DIAGNOSIS — A4101 Sepsis due to Methicillin susceptible Staphylococcus aureus: Principal | ICD-10-CM | POA: Diagnosis present

## 2024-11-19 DIAGNOSIS — Z66 Do not resuscitate: Secondary | ICD-10-CM | POA: Diagnosis present

## 2024-11-19 DIAGNOSIS — X781XXA Intentional self-harm by knife, initial encounter: Secondary | ICD-10-CM | POA: Diagnosis present

## 2024-11-19 DIAGNOSIS — J440 Chronic obstructive pulmonary disease with acute lower respiratory infection: Secondary | ICD-10-CM | POA: Diagnosis present

## 2024-11-19 DIAGNOSIS — Z8614 Personal history of Methicillin resistant Staphylococcus aureus infection: Secondary | ICD-10-CM

## 2024-11-19 DIAGNOSIS — S21139A Puncture wound without foreign body of unspecified front wall of thorax without penetration into thoracic cavity, initial encounter: Principal | ICD-10-CM

## 2024-11-19 DIAGNOSIS — S21109A Unspecified open wound of unspecified front wall of thorax without penetration into thoracic cavity, initial encounter: Secondary | ICD-10-CM

## 2024-11-19 DIAGNOSIS — J470 Bronchiectasis with acute lower respiratory infection: Secondary | ICD-10-CM | POA: Diagnosis present

## 2024-11-19 DIAGNOSIS — J9621 Acute and chronic respiratory failure with hypoxia: Secondary | ICD-10-CM | POA: Diagnosis present

## 2024-11-19 DIAGNOSIS — R64 Cachexia: Secondary | ICD-10-CM | POA: Diagnosis present

## 2024-11-19 DIAGNOSIS — Z515 Encounter for palliative care: Secondary | ICD-10-CM

## 2024-11-19 DIAGNOSIS — J9622 Acute and chronic respiratory failure with hypercapnia: Secondary | ICD-10-CM | POA: Diagnosis present

## 2024-11-19 DIAGNOSIS — E8729 Other acidosis: Secondary | ICD-10-CM | POA: Diagnosis present

## 2024-11-19 DIAGNOSIS — F32A Depression, unspecified: Secondary | ICD-10-CM | POA: Diagnosis present

## 2024-11-19 DIAGNOSIS — J441 Chronic obstructive pulmonary disease with (acute) exacerbation: Secondary | ICD-10-CM | POA: Diagnosis present

## 2024-11-19 DIAGNOSIS — T368X5A Adverse effect of other systemic antibiotics, initial encounter: Secondary | ICD-10-CM | POA: Diagnosis not present

## 2024-11-19 DIAGNOSIS — M4856XA Collapsed vertebra, not elsewhere classified, lumbar region, initial encounter for fracture: Secondary | ICD-10-CM | POA: Diagnosis present

## 2024-11-19 DIAGNOSIS — Z6820 Body mass index (BMI) 20.0-20.9, adult: Secondary | ICD-10-CM

## 2024-11-19 DIAGNOSIS — M4854XA Collapsed vertebra, not elsewhere classified, thoracic region, initial encounter for fracture: Secondary | ICD-10-CM | POA: Diagnosis present

## 2024-11-19 DIAGNOSIS — E039 Hypothyroidism, unspecified: Secondary | ICD-10-CM | POA: Diagnosis present

## 2024-11-19 DIAGNOSIS — G9341 Metabolic encephalopathy: Secondary | ICD-10-CM | POA: Diagnosis present

## 2024-11-19 DIAGNOSIS — J15211 Pneumonia due to Methicillin susceptible Staphylococcus aureus: Secondary | ICD-10-CM | POA: Diagnosis present

## 2024-11-19 DIAGNOSIS — D696 Thrombocytopenia, unspecified: Secondary | ICD-10-CM | POA: Diagnosis present

## 2024-11-19 DIAGNOSIS — Z79899 Other long term (current) drug therapy: Secondary | ICD-10-CM

## 2024-11-19 DIAGNOSIS — S21132A Puncture wound without foreign body of left front wall of thorax without penetration into thoracic cavity, initial encounter: Secondary | ICD-10-CM | POA: Diagnosis present

## 2024-11-19 DIAGNOSIS — L270 Generalized skin eruption due to drugs and medicaments taken internally: Secondary | ICD-10-CM | POA: Diagnosis not present

## 2024-11-19 DIAGNOSIS — E43 Unspecified severe protein-calorie malnutrition: Secondary | ICD-10-CM | POA: Diagnosis present

## 2024-11-19 LAB — CBC WITH DIFFERENTIAL/PLATELET
Band Neutrophils: 27 %
Basophils Absolute: 0 K/uL (ref 0.0–0.1)
Basophils Relative: 0 %
Eosinophils Absolute: 0 K/uL (ref 0.0–0.5)
Eosinophils Relative: 0 %
HCT: 29.2 % — ABNORMAL LOW (ref 39.0–52.0)
Hemoglobin: 9.5 g/dL — ABNORMAL LOW (ref 13.0–17.0)
Lymphocytes Relative: 4 %
Lymphs Abs: 0.5 K/uL — ABNORMAL LOW (ref 0.7–4.0)
MCH: 34.4 pg — ABNORMAL HIGH (ref 26.0–34.0)
MCHC: 32.5 g/dL (ref 30.0–36.0)
MCV: 105.8 fL — ABNORMAL HIGH (ref 80.0–100.0)
Monocytes Absolute: 0.4 K/uL (ref 0.1–1.0)
Monocytes Relative: 3 %
Neutro Abs: 11.3 K/uL — ABNORMAL HIGH (ref 1.7–7.7)
Neutrophils Relative %: 66 %
Platelets: 132 K/uL — ABNORMAL LOW (ref 150–400)
RBC: 2.76 MIL/uL — ABNORMAL LOW (ref 4.22–5.81)
RDW: 12.4 % (ref 11.5–15.5)
WBC: 12.2 K/uL — ABNORMAL HIGH (ref 4.0–10.5)
nRBC: 0 % (ref 0.0–0.2)

## 2024-11-19 LAB — ACETAMINOPHEN LEVEL: Acetaminophen (Tylenol), Serum: 19 ug/mL (ref 10–30)

## 2024-11-19 LAB — I-STAT VENOUS BLOOD GAS, ED
Acid-Base Excess: 4 mmol/L — ABNORMAL HIGH (ref 0.0–2.0)
Bicarbonate: 28.2 mmol/L — ABNORMAL HIGH (ref 20.0–28.0)
Calcium, Ion: 0.96 mmol/L — ABNORMAL LOW (ref 1.15–1.40)
HCT: 36 % — ABNORMAL LOW (ref 39.0–52.0)
Hemoglobin: 12.2 g/dL — ABNORMAL LOW (ref 13.0–17.0)
O2 Saturation: 86 %
Potassium: 3.6 mmol/L (ref 3.5–5.1)
Sodium: 137 mmol/L (ref 135–145)
TCO2: 29 mmol/L (ref 22–32)
pCO2, Ven: 40.7 mmHg — ABNORMAL LOW (ref 44–60)
pH, Ven: 7.448 — ABNORMAL HIGH (ref 7.25–7.43)
pO2, Ven: 49 mmHg — ABNORMAL HIGH (ref 32–45)

## 2024-11-19 LAB — I-STAT ARTERIAL BLOOD GAS, ED
Acid-Base Excess: 8 mmol/L — ABNORMAL HIGH (ref 0.0–2.0)
Acid-Base Excess: 3 mmol/L — ABNORMAL HIGH (ref 0.0–2.0)
Bicarbonate: 32.7 mmol/L — ABNORMAL HIGH (ref 20.0–28.0)
Bicarbonate: 29.2 mmol/L — ABNORMAL HIGH (ref 20.0–28.0)
Calcium, Ion: 1.08 mmol/L — ABNORMAL LOW (ref 1.15–1.40)
Calcium, Ion: 1.09 mmol/L — ABNORMAL LOW (ref 1.15–1.40)
HCT: 29 % — ABNORMAL LOW (ref 39.0–52.0)
HCT: 28 % — ABNORMAL LOW (ref 39.0–52.0)
Hemoglobin: 9.9 g/dL — ABNORMAL LOW (ref 13.0–17.0)
Hemoglobin: 9.5 g/dL — ABNORMAL LOW (ref 13.0–17.0)
O2 Saturation: 100 %
O2 Saturation: 100 %
Potassium: 3 mmol/L — ABNORMAL LOW (ref 3.5–5.1)
Potassium: 3.6 mmol/L (ref 3.5–5.1)
Sodium: 136 mmol/L (ref 135–145)
Sodium: 137 mmol/L (ref 135–145)
TCO2: 34 mmol/L — ABNORMAL HIGH (ref 22–32)
TCO2: 31 mmol/L (ref 22–32)
pCO2 arterial: 47.8 mmHg (ref 32–48)
pCO2 arterial: 54.2 mmHg — ABNORMAL HIGH (ref 32–48)
pH, Arterial: 7.443 (ref 7.35–7.45)
pH, Arterial: 7.34 — ABNORMAL LOW (ref 7.35–7.45)
pO2, Arterial: 593 mmHg — ABNORMAL HIGH (ref 83–108)
pO2, Arterial: 447 mmHg — ABNORMAL HIGH (ref 83–108)

## 2024-11-19 LAB — I-STAT CHEM 8, ED
BUN: 26 mg/dL — ABNORMAL HIGH (ref 6–20)
Calcium, Ion: 0.96 mmol/L — ABNORMAL LOW (ref 1.15–1.40)
Chloride: 99 mmol/L (ref 98–111)
Creatinine, Ser: 0.7 mg/dL (ref 0.61–1.24)
Glucose, Bld: 181 mg/dL — ABNORMAL HIGH (ref 70–99)
HCT: 37 % — ABNORMAL LOW (ref 39.0–52.0)
Hemoglobin: 12.6 g/dL — ABNORMAL LOW (ref 13.0–17.0)
Potassium: 3.6 mmol/L (ref 3.5–5.1)
Sodium: 137 mmol/L (ref 135–145)
TCO2: 26 mmol/L (ref 22–32)

## 2024-11-19 LAB — URINE DRUG SCREEN
Amphetamines: NEGATIVE
Barbiturates: NEGATIVE
Benzodiazepines: NEGATIVE
Cocaine: NEGATIVE
Fentanyl: NEGATIVE
Methadone Scn, Ur: NEGATIVE
Opiates: NEGATIVE
Tetrahydrocannabinol: NEGATIVE

## 2024-11-19 LAB — RESP PANEL BY RT-PCR (RSV, FLU A&B, COVID)  RVPGX2
Influenza A by PCR: NEGATIVE
Influenza B by PCR: NEGATIVE
Resp Syncytial Virus by PCR: NEGATIVE
SARS Coronavirus 2 by RT PCR: NEGATIVE

## 2024-11-19 LAB — CBG MONITORING, ED: Glucose-Capillary: 169 mg/dL — ABNORMAL HIGH (ref 70–99)

## 2024-11-19 LAB — ETHANOL: Alcohol, Ethyl (B): <15 mg/dL

## 2024-11-19 LAB — I-STAT CG4 LACTIC ACID, ED: Lactic Acid, Venous: 5.1 mmol/L (ref 0.5–1.9)

## 2024-11-19 LAB — SALICYLATE LEVEL: Salicylate Lvl: <7 mg/dL — ABNORMAL LOW (ref 7.0–30.0)

## 2024-11-19 LAB — CK: Total CK: 286 U/L (ref 49–397)

## 2024-11-19 MED ORDER — PROPOFOL 1000 MG/100ML IV EMUL
0.0000 ug/kg/min | INTRAVENOUS | Status: DC
  Administered 2024-11-19: 10 ug/kg/min via INTRAVENOUS
  Administered 2024-11-20: 35 ug/kg/min via INTRAVENOUS
  Administered 2024-11-20: 10 ug/kg/min via INTRAVENOUS
  Administered 2024-11-21: 35 ug/kg/min via INTRAVENOUS
  Administered 2024-11-21: 30 ug/kg/min via INTRAVENOUS
  Administered 2024-11-21: 35 ug/kg/min via INTRAVENOUS
  Administered 2024-11-22: 25 ug/kg/min via INTRAVENOUS
  Administered 2024-11-24: 10 ug/kg/min via INTRAVENOUS
  Administered 2024-11-24: 35 ug/kg/min via INTRAVENOUS
  Administered 2024-11-25: 30 ug/kg/min via INTRAVENOUS
  Administered 2024-11-25: 35 ug/kg/min via INTRAVENOUS
  Filled 2024-11-19 (×13): qty 100

## 2024-11-19 MED ORDER — CHLORHEXIDINE GLUCONATE CLOTH 2 % EX PADS
6.0000 | MEDICATED_PAD | Freq: Every day | CUTANEOUS | Status: DC
  Administered 2024-11-20 – 2024-12-01 (×12): 6 via TOPICAL

## 2024-11-19 MED ADMIN — DOXYCYCLINE 100 MG IVPB: 100 mg | INTRAVENOUS | NDC 72266023701

## 2024-11-19 MED ADMIN — Fentanyl Citrate-NaCl 0.9% IV Soln 2.5 MG/250ML: 50 ug/h | INTRAVENOUS | NDC 73177010225

## 2024-11-19 MED ADMIN — Lactated Ringer's Solution: 1000 mL | INTRAVENOUS | NDC 00264775000

## 2024-11-19 MED ADMIN — Enoxaparin Sodium Inj Soln Pref Syr 40 MG/0.4ML: 40 mg | SUBCUTANEOUS | NDC 00781324602

## 2024-11-19 MED ADMIN — CEFTRIAXONE 2 G/100 ML IVPB MIXTURE: 2 g | INTRAVENOUS | NDC 00781320990

## 2024-11-19 MED ADMIN — Pantoprazole Sodium For IV Soln 40 MG (Base Equiv): 40 mg | INTRAVENOUS | NDC 00008092351

## 2024-11-19 MED ADMIN — Haloperidol Lactate Inj 5 MG/ML: 2 mg | INTRAVENOUS | NDC 67457042600

## 2024-11-19 MED ADMIN — Etomidate IV Soln 2 MG/ML: 15 mg | INTRAVENOUS | NDC 55150022110

## 2024-11-19 MED ADMIN — Iohexol IV Soln 350 MG/ML: 75 mL | INTRAVENOUS | NDC 00407141490

## 2024-11-19 MED ADMIN — Rocuronium Bromide IV Soln Pref Syr 100 MG/10ML (10 MG/ML): 50 mg | INTRAVENOUS | NDC 99999070048

## 2024-11-19 MED FILL — Enoxaparin Sodium Inj Soln Pref Syr 40 MG/0.4ML: 40.0000 mg | INTRAMUSCULAR | Qty: 0.4 | Status: AC

## 2024-11-19 MED FILL — Pantoprazole Sodium For IV Soln 40 MG (Base Equiv): 40.0000 mg | INTRAVENOUS | Qty: 10 | Status: AC

## 2024-11-19 MED FILL — Fentanyl Citrate-NaCl 0.9% IV Soln 2.5 MG/250ML: 0.0000 ug/h | INTRAVENOUS | Qty: 250 | Status: AC

## 2024-11-19 MED FILL — Ceftriaxone Sodium For Inj 2 GM: 2.0000 g | INTRAMUSCULAR | Qty: 20 | Status: AC

## 2024-11-19 MED FILL — Doxycycline Hyclate For Inj 100 MG: 100.0000 mg | INTRAVENOUS | Qty: 100 | Status: AC

## 2024-11-19 NOTE — Progress Notes (Signed)
" °   11/19/24 1835  Adult Ventilator Settings  Vt Set (S)  440 mL  Set Rate (S)  20 bmp    Ventilator changes made per CCM due to peak pressuring in 40s. Peak pressures in 20s with change.  "

## 2024-11-19 NOTE — Progress Notes (Signed)
 Chaplain responds to level 1 trauma and provides compassionate presence as medical team cares for pt. No family or loved ones present.Will refer to oncoming chaplain for follow-up.
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
## 2024-11-19 NOTE — ED Notes (Signed)
 Iv infiltrated, md ordered additional dose of RSI meds
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
## 2024-11-19 NOTE — Progress Notes (Incomplete)
 Trauma Event Note    Reason for Call :    Initial Focused Assessment:    Interventions:    Plan of Care:    Event Summary:    MD Notified: Call Time: Arrival Time: End Time:  Last imported Vital Signs BP 99/68   Pulse 77   Temp 98.5 F (36.9 C) (Temporal)   Resp 15   Ht 5' 3 (1.6 m)   Wt 110 lb 3.7 oz (50 kg)   SpO2 100%   BMI 19.53 kg/m   Trending CBC Recent Labs    11/19/24 1651 11/19/24 1741 11/19/24 1752  WBC  --   --  12.2*  HGB 12.2*  12.6* 9.9* 9.5*  HCT 36.0*  37.0* 29.0* 29.2*  PLT  --   --  132*    Trending Coag's No results for input(s): APTT, INR in the last 72 hours.  Trending BMET Recent Labs    11/19/24 1651 11/19/24 1741  NA 137  137 136  K 3.6  3.6 3.0*  CL 99  --   BUN 26*  --   CREATININE 0.70  --   GLUCOSE 181*  --       Darice CHRISTELLA Rouleau  Trauma Response RN  Please call TRN at [phone] for further assistance.

## 2024-11-20 ENCOUNTER — Inpatient Hospital Stay (HOSPITAL_COMMUNITY)

## 2024-11-20 DIAGNOSIS — J439 Emphysema, unspecified: Secondary | ICD-10-CM | POA: Diagnosis not present

## 2024-11-20 DIAGNOSIS — R739 Hyperglycemia, unspecified: Secondary | ICD-10-CM | POA: Diagnosis not present

## 2024-11-20 DIAGNOSIS — D649 Anemia, unspecified: Secondary | ICD-10-CM | POA: Diagnosis not present

## 2024-11-20 DIAGNOSIS — I959 Hypotension, unspecified: Secondary | ICD-10-CM

## 2024-11-20 DIAGNOSIS — D696 Thrombocytopenia, unspecified: Secondary | ICD-10-CM | POA: Diagnosis not present

## 2024-11-20 DIAGNOSIS — J9621 Acute and chronic respiratory failure with hypoxia: Secondary | ICD-10-CM

## 2024-11-20 DIAGNOSIS — J9601 Acute respiratory failure with hypoxia: Secondary | ICD-10-CM | POA: Diagnosis not present

## 2024-11-20 DIAGNOSIS — E039 Hypothyroidism, unspecified: Secondary | ICD-10-CM | POA: Diagnosis not present

## 2024-11-20 DIAGNOSIS — J15211 Pneumonia due to Methicillin susceptible Staphylococcus aureus: Secondary | ICD-10-CM | POA: Diagnosis not present

## 2024-11-20 DIAGNOSIS — J441 Chronic obstructive pulmonary disease with (acute) exacerbation: Secondary | ICD-10-CM | POA: Diagnosis not present

## 2024-11-20 DIAGNOSIS — E43 Unspecified severe protein-calorie malnutrition: Secondary | ICD-10-CM | POA: Diagnosis not present

## 2024-11-20 DIAGNOSIS — G9341 Metabolic encephalopathy: Secondary | ICD-10-CM | POA: Diagnosis not present

## 2024-11-20 LAB — CBC
HCT: 30.1 % — ABNORMAL LOW (ref 39.0–52.0)
HCT: 27.4 % — ABNORMAL LOW (ref 39.0–52.0)
Hemoglobin: 9.7 g/dL — ABNORMAL LOW (ref 13.0–17.0)
Hemoglobin: 9 g/dL — ABNORMAL LOW (ref 13.0–17.0)
MCH: 33.8 pg (ref 26.0–34.0)
MCH: 34.5 pg — ABNORMAL HIGH (ref 26.0–34.0)
MCHC: 32.2 g/dL (ref 30.0–36.0)
MCHC: 32.8 g/dL (ref 30.0–36.0)
MCV: 104.9 fL — ABNORMAL HIGH (ref 80.0–100.0)
MCV: 105 fL — ABNORMAL HIGH (ref 80.0–100.0)
Platelets: 160 K/uL (ref 150–400)
Platelets: 147 K/uL — ABNORMAL LOW (ref 150–400)
RBC: 2.87 MIL/uL — ABNORMAL LOW (ref 4.22–5.81)
RBC: 2.61 MIL/uL — ABNORMAL LOW (ref 4.22–5.81)
RDW: 12.5 % (ref 11.5–15.5)
RDW: 12.7 % (ref 11.5–15.5)
WBC: 11.2 K/uL — ABNORMAL HIGH (ref 4.0–10.5)
WBC: 11.1 K/uL — ABNORMAL HIGH (ref 4.0–10.5)
nRBC: 0 % (ref 0.0–0.2)
nRBC: 0 % (ref 0.0–0.2)

## 2024-11-20 LAB — POCT I-STAT 7, (LYTES, BLD GAS, ICA,H+H)
Acid-base deficit: 3 mmol/L — ABNORMAL HIGH (ref 0.0–2.0)
Acid-base deficit: 3 mmol/L — ABNORMAL HIGH (ref 0.0–2.0)
Bicarbonate: 26.5 mmol/L (ref 20.0–28.0)
Bicarbonate: 24.3 mmol/L (ref 20.0–28.0)
Calcium, Ion: 1.22 mmol/L (ref 1.15–1.40)
Calcium, Ion: 1.16 mmol/L (ref 1.15–1.40)
HCT: 30 % — ABNORMAL LOW (ref 39.0–52.0)
HCT: 28 % — ABNORMAL LOW (ref 39.0–52.0)
Hemoglobin: 10.2 g/dL — ABNORMAL LOW (ref 13.0–17.0)
Hemoglobin: 9.5 g/dL — ABNORMAL LOW (ref 13.0–17.0)
O2 Saturation: 81 %
O2 Saturation: 91 %
Patient temperature: 37
Patient temperature: 36.5
Potassium: 4 mmol/L (ref 3.5–5.1)
Potassium: 3.9 mmol/L (ref 3.5–5.1)
Sodium: 139 mmol/L (ref 135–145)
Sodium: 138 mmol/L (ref 135–145)
TCO2: 29 mmol/L (ref 22–32)
TCO2: 26 mmol/L (ref 22–32)
pCO2 arterial: 74.5 mmHg (ref 32–48)
pCO2 arterial: 51.8 mmHg — ABNORMAL HIGH (ref 32–48)
pH, Arterial: 7.159 — CL (ref 7.35–7.45)
pH, Arterial: 7.276 — ABNORMAL LOW (ref 7.35–7.45)
pO2, Arterial: 60 mmHg — ABNORMAL LOW (ref 83–108)
pO2, Arterial: 68 mmHg — ABNORMAL LOW (ref 83–108)

## 2024-11-20 LAB — CBG MONITORING, ED
Glucose-Capillary: 62 mg/dL — ABNORMAL LOW (ref 70–99)
Glucose-Capillary: 131 mg/dL — ABNORMAL HIGH (ref 70–99)

## 2024-11-20 LAB — BASIC METABOLIC PANEL WITH GFR
Anion gap: 9 (ref 5–15)
BUN: 19 mg/dL (ref 6–20)
CO2: 27 mmol/L (ref 22–32)
Calcium: 7.6 mg/dL — ABNORMAL LOW (ref 8.9–10.3)
Chloride: 104 mmol/L (ref 98–111)
Creatinine, Ser: 0.62 mg/dL (ref 0.61–1.24)
GFR, Estimated: >60 mL/min
Glucose, Bld: 56 mg/dL — ABNORMAL LOW (ref 70–99)
Potassium: 4.3 mmol/L (ref 3.5–5.1)
Sodium: 140 mmol/L (ref 135–145)

## 2024-11-20 LAB — PHOSPHORUS: Phosphorus: 3.1 mg/dL (ref 2.5–4.6)

## 2024-11-20 LAB — MAGNESIUM: Magnesium: 1.5 mg/dL — ABNORMAL LOW (ref 1.7–2.4)

## 2024-11-20 LAB — GLUCOSE, CAPILLARY
Glucose-Capillary: 73 mg/dL (ref 70–99)
Glucose-Capillary: 95 mg/dL (ref 70–99)

## 2024-11-20 LAB — CREATININE, SERUM
Creatinine, Ser: 0.73 mg/dL (ref 0.61–1.24)
GFR, Estimated: >60 mL/min

## 2024-11-20 LAB — COOXEMETRY PANEL
Carboxyhemoglobin: 2 % — ABNORMAL HIGH (ref 0.5–1.5)
Methemoglobin: <0.7 % (ref 0.0–1.5)
O2 Saturation: 70.1 %
Total hemoglobin: 8.7 g/dL — ABNORMAL LOW (ref 12.0–16.0)

## 2024-11-20 LAB — HIV ANTIBODY (ROUTINE TESTING W REFLEX): HIV Screen 4th Generation wRfx: NONREACTIVE

## 2024-11-20 LAB — TRIGLYCERIDES: Triglycerides: 116 mg/dL

## 2024-11-20 LAB — LACTIC ACID, PLASMA: Lactic Acid, Venous: 1.2 mmol/L (ref 0.5–1.9)

## 2024-11-20 LAB — MRSA NEXT GEN BY PCR, NASAL: MRSA by PCR Next Gen: NOT DETECTED

## 2024-11-20 MED ORDER — BUDESONIDE 0.25 MG/2ML IN SUSP
0.2500 mg | Freq: Two times a day (BID) | RESPIRATORY_TRACT | Status: DC
  Administered 2024-11-20 – 2024-12-01 (×23): 0.25 mg via RESPIRATORY_TRACT
  Filled 2024-11-20 (×12): qty 2

## 2024-11-20 MED ORDER — ALBUTEROL SULFATE (2.5 MG/3ML) 0.083% IN NEBU
2.5000 mg | INHALATION_SOLUTION | RESPIRATORY_TRACT | Status: DC | PRN
  Administered 2024-11-21: 2.5 mg via RESPIRATORY_TRACT
  Filled 2024-11-20: qty 3

## 2024-11-20 MED ORDER — VASOPRESSIN 20 UNITS/100 ML INFUSION FOR SHOCK
0.0000 [IU]/min | INTRAVENOUS | Status: DC
  Administered 2024-11-21 – 2024-11-22 (×6): 0.04 [IU]/min via INTRAVENOUS
  Filled 2024-11-20 (×7): qty 100

## 2024-11-20 MED ORDER — ORAL CARE MOUTH RINSE
15.0000 mL | OROMUCOSAL | Status: DC
  Administered 2024-11-20 – 2024-12-01 (×135): 15 mL via OROMUCOSAL

## 2024-11-20 MED ORDER — ORAL CARE MOUTH RINSE: 15.0000 mL | OROMUCOSAL | Status: DC | PRN

## 2024-11-20 MED ORDER — IPRATROPIUM-ALBUTEROL 0.5-2.5 (3) MG/3ML IN SOLN
3.0000 mL | RESPIRATORY_TRACT | Status: DC
  Administered 2024-11-20 – 2024-12-01 (×65): 3 mL via RESPIRATORY_TRACT
  Filled 2024-11-20 (×32): qty 3

## 2024-11-20 MED ORDER — ACETAMINOPHEN 650 MG RE SUPP
650.0000 mg | Freq: Four times a day (QID) | RECTAL | Status: DC | PRN
  Administered 2024-11-20: 650 mg via RECTAL
  Filled 2024-11-20: qty 1

## 2024-11-20 MED ORDER — NOREPINEPHRINE 4 MG/250ML-% IV SOLN
0.0000 ug/min | INTRAVENOUS | Status: DC
  Administered 2024-11-20: 24 ug/min via INTRAVENOUS
  Administered 2024-11-21: 26 ug/min via INTRAVENOUS
  Administered 2024-11-21: 19 ug/min via INTRAVENOUS
  Administered 2024-11-21: 8 ug/min via INTRAVENOUS
  Administered 2024-11-21: 9 ug/min via INTRAVENOUS
  Administered 2024-11-21: 20 ug/min via INTRAVENOUS
  Administered 2024-11-22: 8 ug/min via INTRAVENOUS
  Administered 2024-11-24: 5 ug/min via INTRAVENOUS
  Filled 2024-11-20 (×7): qty 250

## 2024-11-20 MED ORDER — FENTANYL BOLUS VIA INFUSION
100.0000 ug | INTRAVENOUS | Status: DC | PRN
  Administered 2024-11-20: 100 ug via INTRAVENOUS
  Administered 2024-11-20: 50 ug via INTRAVENOUS
  Administered 2024-11-21 – 2024-11-23 (×5): 100 ug via INTRAVENOUS

## 2024-11-20 MED ORDER — ARFORMOTEROL TARTRATE 15 MCG/2ML IN NEBU
15.0000 ug | INHALATION_SOLUTION | Freq: Two times a day (BID) | RESPIRATORY_TRACT | Status: DC
  Administered 2024-11-20 – 2024-12-01 (×23): 15 ug via RESPIRATORY_TRACT
  Filled 2024-11-20 (×12): qty 2

## 2024-11-20 MED ADMIN — Vecuronium Bromide For Inj 10 MG: 5 mg | INTRAVENOUS | NDC 67457043800

## 2024-11-20 MED ADMIN — Ipratropium-Albuterol Nebu Soln 0.5-2.5(3) MG/3ML: 6 mL | RESPIRATORY_TRACT | NDC 76204060001

## 2024-11-20 MED ADMIN — Hydrocortisone Sodium Succinate PF For Inj 100 MG: 100 mg | INTRAVENOUS | NDC 00009001103

## 2024-11-20 MED ADMIN — DOXYCYCLINE 100 MG IVPB: 100 mg | INTRAVENOUS | NDC 72266023701

## 2024-11-20 MED ADMIN — Fentanyl Citrate-NaCl 0.9% IV Soln 2.5 MG/250ML: 300 ug/h | INTRAVENOUS | NDC 70004020240

## 2024-11-20 MED ADMIN — Fentanyl Citrate-NaCl 0.9% IV Soln 2.5 MG/250ML: 300 ug/h | INTRAVENOUS | NDC 73177010225

## 2024-11-20 MED ADMIN — Linezolid IV Soln 600 MG/300ML (2 MG/ML): 600 mg | INTRAVENOUS | NDC 57664068331

## 2024-11-20 MED ADMIN — Enoxaparin Sodium Inj Soln Pref Syr 40 MG/0.4ML: 40 mg | SUBCUTANEOUS | NDC 71839011010

## 2024-11-20 MED ADMIN — Albumin, Human Inj 25%: 25 g | INTRAVENOUS | NDC 44206025105

## 2024-11-20 MED ADMIN — Albumin, Human Inj 25%: 12.5 g | INTRAVENOUS | NDC 44206025105

## 2024-11-20 MED ADMIN — CEFTRIAXONE 2 G/100 ML IVPB MIXTURE: 2 g | INTRAVENOUS | NDC 00781320990

## 2024-11-20 MED ADMIN — Sodium Chloride Soln Nebu 3%: 4 mL | RESPIRATORY_TRACT | NDC 00487900360

## 2024-11-20 MED ADMIN — Sodium Chloride IV Soln 0.9%: 250 mL | INTRAVENOUS | NDC 00338004902

## 2024-11-20 MED ADMIN — Pantoprazole Sodium For IV Soln 40 MG (Base Equiv): 40 mg | INTRAVENOUS | NDC 00008092351

## 2024-11-20 MED ADMIN — Albumin, Human Inj 25%: 25 g | INTRAVENOUS | NDC 44206025190

## 2024-11-20 MED ADMIN — vasopressin 20 units/100 mL infusion: 0.03 [IU]/min | INTRAVENOUS | NDC 81298855203

## 2024-11-20 MED ADMIN — Lactated Ringer's Solution: 1000 mL | INTRAVENOUS | NDC 00264775000

## 2024-11-20 MED ADMIN — Norepinephrine-Dextrose IV Solution 4 MG/250ML-5%: 2 ug/min | INTRAVENOUS | NDC 00338011220

## 2024-11-20 MED ADMIN — Albuterol Sulfate Soln Nebu 0.083% (2.5 MG/3ML): 2.5 mg | RESPIRATORY_TRACT | NDC 00378827031

## 2024-11-20 MED ADMIN — Magnesium Sulfate IV Soln 4 GM/100ML (40 MG/ML): 4 g | INTRAVENOUS | NDC 70121172001

## 2024-11-20 MED FILL — Linezolid IV Soln 600 MG/300ML (2 MG/ML): 600.0000 mg | INTRAVENOUS | Qty: 300 | Status: AC

## 2024-11-20 MED FILL — Hydrocortisone Sodium Succinate PF For Inj 100 MG: 100.0000 mg | INTRAMUSCULAR | Qty: 2 | Status: AC

## 2024-11-20 MED FILL — Norepinephrine-Dextrose IV Solution 4 MG/250ML-5%: 0.0000 ug/min | INTRAVENOUS | Qty: 250 | Status: AC

## 2024-11-20 MED FILL — Albumin, Human Inj 25%: 25.0000 g | INTRAVENOUS | Qty: 100 | Status: AC

## 2024-11-20 MED FILL — Vecuronium Bromide For Inj 10 MG: 5.0000 mg | INTRAVENOUS | Qty: 10 | Status: AC

## 2024-11-20 MED FILL — Vasopressin-Sodium Chloride IV Soln 20 Unit/100ML-0.9%: 0.0000 [IU]/min | INTRAVENOUS | Qty: 100 | Status: AC

## 2024-11-20 MED FILL — Magnesium Sulfate IV Soln 4 GM/100ML (40 MG/ML): 4.0000 g | INTRAVENOUS | Qty: 100 | Status: AC

## 2024-11-20 MED FILL — Albuterol Sulfate Soln Nebu 0.083% (2.5 MG/3ML): 2.5000 mg | RESPIRATORY_TRACT | Qty: 3 | Status: AC

## 2024-11-20 MED FILL — Sodium Chloride Soln Nebu 3%: 4.0000 mL | RESPIRATORY_TRACT | Qty: 4 | Status: AC

## 2024-11-20 MED FILL — Dextrose Inj 50%: INTRAVENOUS | Qty: 50 | Status: AC

## 2024-11-20 MED FILL — Ipratropium-Albuterol Nebu Soln 0.5-2.5(3) MG/3ML: 6.0000 mL | RESPIRATORY_TRACT | Qty: 3 | Status: AC

## 2024-11-20 NOTE — Progress Notes (Addendum)
 eLink Physician-Brief Progress Note Patient Name: [name] DOB: 09/27/1976 MRN: [id number]   Date of Service  11/20/2024  HPI/Events of Note  RN requesting to clarify RASS goal. Given vec x 1 this evening.  CT chest and ABG reviewed  eICU Interventions  Updated RASS goal -4. Hold on further paralytics as oxygenation seems adequate on current settings. Suspect oxygenation is driven by very severe chronic lung disease rather than any acute pulmonary issues.      Intervention Category Intermediate Interventions: Respiratory distress - evaluation and management  [name] Staff 11/20/2024, 8:52 PM
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
## 2024-11-20 NOTE — Progress Notes (Addendum)
 eLink Physician-Brief Progress Note Patient Name: [name] DOB: November 27, 1975 MRN: [id number]   Date of Service  11/20/2024  HPI/Events of Note  Worsening shock. Levophed  increased to 24 mcg/h  A-line placed and confirmed refractory hypotension  ABG reviewed on PRVC 6 cc/kg and PEEP 10. Improved ventilation with acceptable pH. Low but adequate oxygenation likely driven by poor circulation. Pplat 36  Presumed septic shock with new left pulmonary infiltrate  eICU Interventions  Levophed  and vasopressin  for MAP >65 1L bolus Start stress dose steroids Obtain co-ox>>mixed Echocardiogram Give additional albuterol  neb    Intervention Category Major Interventions: Sepsis - evaluation and management  [name] Staff 11/20/2024, 10:50 PM

## 2024-11-20 NOTE — ED Notes (Signed)
 Contacted Critical care PA regarding patient HR. Pt has been tachy around 110s but is now sustaining 120s

## 2024-11-20 NOTE — ED Notes (Addendum)
 Pt lab glucose resulted and was 54. Primary RN completed CBG and it was 62. 1 amp of D50 was given to increase pt BG. Provider notified of interventions for hypoglycemia

## 2024-11-20 NOTE — Procedures (Signed)
 Arterial Line Insertion Start/End1/12/2024 9:20 PM, 11/20/2024 9:30 PM  Patient location: ICU. Preanesthetic checklist: patient identified, monitors and equipment checked and timeout performed Right, radial was placed Catheter size: 20 G Hand hygiene performed , maximum sterile barriers used  and Seldinger technique used Allen's test indicative of satisfactory collateral circulation Attempts: 2 Following insertion, dressing applied and Biopatch. Patient tolerated the procedure well with no immediate complications. Additional procedure comments: Normal saline flush used, site leveled and zeroed, good waveform and blood return. RN at bedside. .   Alarms Checked

## 2024-11-20 NOTE — ED Notes (Signed)
 BP cuff changed to ensure effective readings.

## 2024-11-20 NOTE — Progress Notes (Signed)
 Worsening hypoxia, FiO2 at 60%, PEEP titrated up to 10. Plateau pressures at 8 and mL/kg exceeding 32, tidal volume decreased to 6 cc/kg with respiratory rate increased to 30 to match minute ventilation. Sedation escalated, one-time neuromuscular blockade administrated Current Plateau pressures 30, FiO2 has gone from 78 to 93%.  Need to limit PEEP to 10 given underlying severe bullous disease and ensure Plateau pressure remains less than 30 He did develop worsening hypotension with this, central venous access was placed to allow titration of norepinephrine , also added vasopressin .  Awaiting follow-up arterial blood gas after all changes chest x-ray shows ongoing severe bullous disease in bilateral upper lobes, worsening patchy airspace disease on the left more so than the right.  Central venous catheter is in satisfactory position Plan Will continue heavy sedation, have increased RASS goal to -2 to -3 Continue current antibiotics Continue low tidal volume ventilation at 6 cc/kg, he is oxygenating with this, hopefully we can start to wean his PEEP/FiO2 down.  Would avoid driving PEEP higher than 10 given his underlying severe bullous disease

## 2024-11-20 NOTE — ED Notes (Signed)
 Bair hugger removed and pt covered in warm blankets

## 2024-11-20 NOTE — Procedures (Signed)
 Central Venous Catheter Insertion Procedure Note  [name]  [id number]  1975-12-30  Date:11/20/2024  Time:7:17 PM   Provider Performing:Pete FORBES Jenna   Procedure: Insertion of Non-tunneled Central Venous [phone]) with US  guidance ([id number])   Indication(s) Medication administration  Consent Risks of the procedure as well as the alternatives and risks of each were explained to the patient and/or caregiver.  Consent for the procedure was obtained and is signed in the bedside chart  Anesthesia Topical only with 1% lidocaine   Timeout Verified patient identification, verified procedure, site/side was marked, verified correct patient position, special equipment/implants available, medications/allergies/relevant history reviewed, required imaging and test results available.  Sterile Technique Maximal sterile technique including full sterile barrier drape, hand hygiene, sterile gown, sterile gloves, mask, hair covering, sterile ultrasound probe cover (if used).  Procedure Description Area of catheter insertion was cleaned with chlorhexidine  and draped in sterile fashion.  With real-time ultrasound guidance a central venous catheter was placed into the left internal jugular vein. Nonpulsatile blood flow and easy flushing noted in all ports.  The catheter was sutured in place and sterile dressing applied.  Complications/Tolerance None; patient tolerated the procedure well. Chest X-ray is ordered to verify placement for internal jugular or subclavian cannulation.   Chest x-ray is not ordered for femoral cannulation.  EBL Minimal  Specimen(s) None
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
## 2024-11-21 ENCOUNTER — Inpatient Hospital Stay (HOSPITAL_COMMUNITY)

## 2024-11-21 ENCOUNTER — Encounter (HOSPITAL_COMMUNITY): Payer: Self-pay | Admitting: Pulmonary Disease

## 2024-11-21 DIAGNOSIS — J9621 Acute and chronic respiratory failure with hypoxia: Secondary | ICD-10-CM | POA: Diagnosis not present

## 2024-11-21 DIAGNOSIS — J9622 Acute and chronic respiratory failure with hypercapnia: Secondary | ICD-10-CM | POA: Diagnosis not present

## 2024-11-21 DIAGNOSIS — R6521 Severe sepsis with septic shock: Secondary | ICD-10-CM | POA: Diagnosis not present

## 2024-11-21 DIAGNOSIS — E43 Unspecified severe protein-calorie malnutrition: Secondary | ICD-10-CM | POA: Diagnosis not present

## 2024-11-21 DIAGNOSIS — R578 Other shock: Secondary | ICD-10-CM | POA: Diagnosis not present

## 2024-11-21 DIAGNOSIS — R918 Other nonspecific abnormal finding of lung field: Secondary | ICD-10-CM | POA: Diagnosis not present

## 2024-11-21 DIAGNOSIS — E039 Hypothyroidism, unspecified: Secondary | ICD-10-CM | POA: Diagnosis not present

## 2024-11-21 DIAGNOSIS — A419 Sepsis, unspecified organism: Secondary | ICD-10-CM

## 2024-11-21 DIAGNOSIS — S21139A Puncture wound without foreign body of unspecified front wall of thorax without penetration into thoracic cavity, initial encounter: Secondary | ICD-10-CM | POA: Diagnosis not present

## 2024-11-21 LAB — ECHOCARDIOGRAM COMPLETE
Area-P 1/2: 4.06 cm2
Height: 63 in
S' Lateral: 2.6 cm
Single Plane A4C EF: 54.6 %
Weight: 1820.12 [oz_av]

## 2024-11-21 LAB — GLUCOSE, CAPILLARY
Glucose-Capillary: 149 mg/dL — ABNORMAL HIGH (ref 70–99)
Glucose-Capillary: 169 mg/dL — ABNORMAL HIGH (ref 70–99)
Glucose-Capillary: 145 mg/dL — ABNORMAL HIGH (ref 70–99)
Glucose-Capillary: 202 mg/dL — ABNORMAL HIGH (ref 70–99)
Glucose-Capillary: 154 mg/dL — ABNORMAL HIGH (ref 70–99)
Glucose-Capillary: 146 mg/dL — ABNORMAL HIGH (ref 70–99)
Glucose-Capillary: 208 mg/dL — ABNORMAL HIGH (ref 70–99)

## 2024-11-21 LAB — POCT I-STAT 7, (LYTES, BLD GAS, ICA,H+H)
Acid-Base Excess: 4 mmol/L — ABNORMAL HIGH (ref 0.0–2.0)
Acid-base deficit: 8 mmol/L — ABNORMAL HIGH (ref 0.0–2.0)
Bicarbonate: 20.6 mmol/L (ref 20.0–28.0)
Bicarbonate: 30.5 mmol/L — ABNORMAL HIGH (ref 20.0–28.0)
Calcium, Ion: 1.03 mmol/L — ABNORMAL LOW (ref 1.15–1.40)
Calcium, Ion: 1.1 mmol/L — ABNORMAL LOW (ref 1.15–1.40)
HCT: 34 % — ABNORMAL LOW (ref 39.0–52.0)
HCT: 27 % — ABNORMAL LOW (ref 39.0–52.0)
Hemoglobin: 11.6 g/dL — ABNORMAL LOW (ref 13.0–17.0)
Hemoglobin: 9.2 g/dL — ABNORMAL LOW (ref 13.0–17.0)
O2 Saturation: 90 %
O2 Saturation: 96 %
Patient temperature: 37.7
Patient temperature: 36
Potassium: 3.7 mmol/L (ref 3.5–5.1)
Potassium: 3.8 mmol/L (ref 3.5–5.1)
Sodium: 139 mmol/L (ref 135–145)
Sodium: 134 mmol/L — ABNORMAL LOW (ref 135–145)
TCO2: 22 mmol/L (ref 22–32)
TCO2: 32 mmol/L (ref 22–32)
pCO2 arterial: 59.2 mmHg — ABNORMAL HIGH (ref 32–48)
pCO2 arterial: 55.8 mmHg — ABNORMAL HIGH (ref 32–48)
pH, Arterial: 7.154 — CL (ref 7.35–7.45)
pH, Arterial: 7.341 — ABNORMAL LOW (ref 7.35–7.45)
pO2, Arterial: 78 mmHg — ABNORMAL LOW (ref 83–108)
pO2, Arterial: 83 mmHg (ref 83–108)

## 2024-11-21 LAB — BASIC METABOLIC PANEL WITH GFR
Anion gap: 8 (ref 5–15)
BUN: 17 mg/dL (ref 6–20)
CO2: 26 mmol/L (ref 22–32)
Calcium: 7.9 mg/dL — ABNORMAL LOW (ref 8.9–10.3)
Chloride: 99 mmol/L (ref 98–111)
Creatinine, Ser: 0.74 mg/dL (ref 0.61–1.24)
GFR, Estimated: >60 mL/min
Glucose, Bld: 182 mg/dL — ABNORMAL HIGH (ref 70–99)
Potassium: 4.6 mmol/L (ref 3.5–5.1)
Sodium: 133 mmol/L — ABNORMAL LOW (ref 135–145)

## 2024-11-21 LAB — CBC
HCT: 29 % — ABNORMAL LOW (ref 39.0–52.0)
Hemoglobin: 9.5 g/dL — ABNORMAL LOW (ref 13.0–17.0)
MCH: 34.5 pg — ABNORMAL HIGH (ref 26.0–34.0)
MCHC: 32.8 g/dL (ref 30.0–36.0)
MCV: 105.5 fL — ABNORMAL HIGH (ref 80.0–100.0)
Platelets: 178 K/uL (ref 150–400)
RBC: 2.75 MIL/uL — ABNORMAL LOW (ref 4.22–5.81)
RDW: 12.9 % (ref 11.5–15.5)
WBC: 25.5 K/uL — ABNORMAL HIGH (ref 4.0–10.5)
nRBC: 0 % (ref 0.0–0.2)

## 2024-11-21 LAB — MAGNESIUM: Magnesium: 2.4 mg/dL (ref 1.7–2.4)

## 2024-11-21 LAB — LACTIC ACID, PLASMA
Lactic Acid, Venous: 1.8 mmol/L (ref 0.5–1.9)
Lactic Acid, Venous: 1.7 mmol/L (ref 0.5–1.9)

## 2024-11-21 LAB — HEMOGLOBIN A1C
Hgb A1c MFr Bld: 5.1 % (ref 4.8–5.6)
Mean Plasma Glucose: 99.67 mg/dL

## 2024-11-21 MED ORDER — SENNA 8.6 MG PO TABS
1.0000 | ORAL_TABLET | Freq: Two times a day (BID) | ORAL | Status: DC
  Administered 2024-11-21 – 2024-11-23 (×5): 8.6 mg
  Filled 2024-11-21 (×5): qty 1

## 2024-11-21 MED ORDER — INSULIN ASPART 100 UNIT/ML IJ SOLN
0.0000 [IU] | INTRAMUSCULAR | Status: DC
  Administered 2024-11-21: 2 [IU] via SUBCUTANEOUS
  Administered 2024-11-21: 1 [IU] via SUBCUTANEOUS
  Administered 2024-11-21: 2 [IU] via SUBCUTANEOUS
  Administered 2024-11-21 (×2): 3 [IU] via SUBCUTANEOUS
  Administered 2024-11-21: 1 [IU] via SUBCUTANEOUS
  Administered 2024-11-22 (×3): 2 [IU] via SUBCUTANEOUS
  Administered 2024-11-22: 5 [IU] via SUBCUTANEOUS
  Administered 2024-11-22: 2 [IU] via SUBCUTANEOUS
  Administered 2024-11-22: 1 [IU] via SUBCUTANEOUS
  Administered 2024-11-23 (×3): 2 [IU] via SUBCUTANEOUS
  Administered 2024-11-23: 3 [IU] via SUBCUTANEOUS
  Administered 2024-11-24 (×3): 2 [IU] via SUBCUTANEOUS
  Administered 2024-11-24 – 2024-11-25 (×6): 1 [IU] via SUBCUTANEOUS
  Administered 2024-11-26 (×2): 2 [IU] via SUBCUTANEOUS
  Administered 2024-11-26: 1 [IU] via SUBCUTANEOUS
  Administered 2024-11-26: 2 [IU] via SUBCUTANEOUS
  Administered 2024-11-26: 1 [IU] via SUBCUTANEOUS
  Administered 2024-11-26 – 2024-11-27 (×3): 2 [IU] via SUBCUTANEOUS
  Administered 2024-11-27: 3 [IU] via SUBCUTANEOUS
  Administered 2024-11-27: 2 [IU] via SUBCUTANEOUS
  Administered 2024-11-27: 5 [IU] via SUBCUTANEOUS
  Administered 2024-11-28: 1 [IU] via SUBCUTANEOUS
  Administered 2024-11-28 (×3): 2 [IU] via SUBCUTANEOUS
  Administered 2024-11-28: 1 [IU] via SUBCUTANEOUS
  Administered 2024-11-28: 2 [IU] via SUBCUTANEOUS
  Administered 2024-11-29: 1 [IU] via SUBCUTANEOUS
  Administered 2024-11-29 (×2): 2 [IU] via SUBCUTANEOUS
  Administered 2024-11-29 – 2024-11-30 (×2): 1 [IU] via SUBCUTANEOUS
  Administered 2024-11-30: 2 [IU] via SUBCUTANEOUS
  Administered 2024-11-30: 1 [IU] via SUBCUTANEOUS
  Administered 2024-11-30 (×3): 2 [IU] via SUBCUTANEOUS
  Administered 2024-12-01: 1 [IU] via SUBCUTANEOUS
  Administered 2024-12-01 (×2): 2 [IU] via SUBCUTANEOUS
  Filled 2024-11-21: qty 1
  Filled 2024-11-21: qty 3
  Filled 2024-11-21: qty 2
  Filled 2024-11-21: qty 1
  Filled 2024-11-21 (×2): qty 3
  Filled 2024-11-21 (×3): qty 2
  Filled 2024-11-21 (×2): qty 3
  Filled 2024-11-21: qty 1
  Filled 2024-11-21: qty 4
  Filled 2024-11-21 (×2): qty 2
  Filled 2024-11-21: qty 5
  Filled 2024-11-21: qty 1
  Filled 2024-11-21 (×3): qty 2
  Filled 2024-11-21 (×2): qty 1
  Filled 2024-11-21: qty 2

## 2024-11-21 MED ORDER — VITAL HP 1.0 CAL PO LIQD
1000.0000 mL | ORAL | Status: DC
  Administered 2024-11-21 – 2024-11-22 (×2): 1000 mL

## 2024-11-21 MED ORDER — DOCUSATE SODIUM 50 MG/5ML PO LIQD
100.0000 mg | Freq: Two times a day (BID) | ORAL | Status: DC
  Administered 2024-11-21 – 2024-11-30 (×9): 100 mg
  Filled 2024-11-21 (×8): qty 10

## 2024-11-21 MED ADMIN — Hydrocortisone Sodium Succinate PF For Inj 100 MG: 100 mg | INTRAVENOUS | NDC 00009001103

## 2024-11-21 MED ADMIN — Fentanyl Citrate-NaCl 0.9% IV Soln 2.5 MG/250ML: 325 ug/h | INTRAVENOUS | NDC 73177010225

## 2024-11-21 MED ADMIN — Linezolid IV Soln 600 MG/300ML (2 MG/ML): 600 mg | INTRAVENOUS | NDC 57664068331

## 2024-11-21 MED ADMIN — Enoxaparin Sodium Inj Soln Pref Syr 40 MG/0.4ML: 40 mg | SUBCUTANEOUS | NDC 00781324602

## 2024-11-21 MED ADMIN — Albumin, Human Inj 25%: 25 g | INTRAVENOUS | NDC 44206025105

## 2024-11-21 MED ADMIN — CEFTRIAXONE 2 G/100 ML IVPB MIXTURE: 2 g | INTRAVENOUS | NDC 00781320990

## 2024-11-21 MED ADMIN — Pantoprazole Sodium For IV Soln 40 MG (Base Equiv): 40 mg | INTRAVENOUS | NDC 00008092351

## 2024-11-21 MED ADMIN — Sodium Bicarbonate IV Soln 8.4%: 100 meq | INTRAVENOUS | NDC 76329335201

## 2024-11-21 MED FILL — Sodium Bicarbonate IV Soln 8.4%: 100.0000 meq | INTRAVENOUS | Qty: 100 | Status: AC
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
## 2024-11-21 NOTE — Progress Notes (Signed)
" °  Echocardiogram 2D Echocardiogram has been performed.  Koleen KANDICE Popper, RDCS 11/21/2024, 8:15 AM "

## 2024-11-21 NOTE — Progress Notes (Signed)
 Interim Progress Notes: Notified by Elink MD to come and assess at beside due to worsening vasopressor needs/shock  Per RN, patient's vasopressor needs began to increase during rewarming, now patient febrile and needing cooling blanket. Last ABG 7.276, PCO2 51.8, PO2 68, Bicarb 23 CVP 11 Coox 70  Now on 24 mcg of levophed  and vasopressin  0.04 mcg  Lactic Acid initially cleared from 5.1 > 1.2  Vent Mode: PRVC FiO2 (%):  [50 %-60 %] 60 % Set Rate:  [20 bmp-30 bmp] 30 bmp Vt Set:  [340 mL-440 mL] 340 mL PEEP:  [5 cmH20-10 cmH20] 10 cmH20 Plateau Pressure:  [27 cmH20-36 cmH20] 36 cmH20   B/L breath sounds- severe rhonchi  On prop and fentanyl  gtt  Suspect worsening septic shock secondary to pneumonia causing increase pressor needs, possible increase related to vasodilation during rewarming, also on sedation- prop and fentanyl  which could be contributing to some of the hypotension  P: Repeat ABG  Repeat Lactic Acid  MAP goal > 65- continue pressors Continue stress dose steroids CVP q 4 Obtain morning labs   Sherlean Sharps AGACNP-BC   [facility] 11/21/2024, 3:42 AM  Please see [email or web address] for pager details.  From 7A-7P if no response, please call [phone]. After hours, please call ELink [phone].
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
## 2024-11-22 ENCOUNTER — Encounter (HOSPITAL_COMMUNITY): Payer: Self-pay | Admitting: Pulmonary Disease

## 2024-11-22 DIAGNOSIS — R918 Other nonspecific abnormal finding of lung field: Secondary | ICD-10-CM | POA: Diagnosis not present

## 2024-11-22 DIAGNOSIS — J9622 Acute and chronic respiratory failure with hypercapnia: Secondary | ICD-10-CM | POA: Diagnosis not present

## 2024-11-22 DIAGNOSIS — J9621 Acute and chronic respiratory failure with hypoxia: Secondary | ICD-10-CM | POA: Diagnosis not present

## 2024-11-22 DIAGNOSIS — A419 Sepsis, unspecified organism: Secondary | ICD-10-CM | POA: Diagnosis not present

## 2024-11-22 LAB — GLUCOSE, CAPILLARY
Glucose-Capillary: 129 mg/dL — ABNORMAL HIGH (ref 70–99)
Glucose-Capillary: 161 mg/dL — ABNORMAL HIGH (ref 70–99)
Glucose-Capillary: 164 mg/dL — ABNORMAL HIGH (ref 70–99)
Glucose-Capillary: 165 mg/dL — ABNORMAL HIGH (ref 70–99)
Glucose-Capillary: 184 mg/dL — ABNORMAL HIGH (ref 70–99)
Glucose-Capillary: 248 mg/dL — ABNORMAL HIGH (ref 70–99)

## 2024-11-22 LAB — COMPREHENSIVE METABOLIC PANEL WITH GFR
ALT: 221 U/L — ABNORMAL HIGH (ref 0–44)
AST: 360 U/L — ABNORMAL HIGH (ref 15–41)
Albumin: 3.4 g/dL — ABNORMAL LOW (ref 3.5–5.0)
Alkaline Phosphatase: 139 U/L — ABNORMAL HIGH (ref 38–126)
Anion gap: 5 (ref 5–15)
BUN: 16 mg/dL (ref 6–20)
CO2: 30 mmol/L (ref 22–32)
Calcium: 8.4 mg/dL — ABNORMAL LOW (ref 8.9–10.3)
Chloride: 98 mmol/L (ref 98–111)
Creatinine, Ser: 0.54 mg/dL — ABNORMAL LOW (ref 0.61–1.24)
GFR, Estimated: >60 mL/min
Glucose, Bld: 136 mg/dL — ABNORMAL HIGH (ref 70–99)
Potassium: 4.4 mmol/L (ref 3.5–5.1)
Sodium: 133 mmol/L — ABNORMAL LOW (ref 135–145)
Total Bilirubin: 0.5 mg/dL (ref 0.0–1.2)
Total Protein: 6 g/dL — ABNORMAL LOW (ref 6.5–8.1)

## 2024-11-22 LAB — CBC
HCT: 28 % — ABNORMAL LOW (ref 39.0–52.0)
Hemoglobin: 9.4 g/dL — ABNORMAL LOW (ref 13.0–17.0)
MCH: 34.8 pg — ABNORMAL HIGH (ref 26.0–34.0)
MCHC: 33.6 g/dL (ref 30.0–36.0)
MCV: 103.7 fL — ABNORMAL HIGH (ref 80.0–100.0)
Platelets: 149 K/uL — ABNORMAL LOW (ref 150–400)
RBC: 2.7 MIL/uL — ABNORMAL LOW (ref 4.22–5.81)
RDW: 12.9 % (ref 11.5–15.5)
WBC: 25.9 K/uL — ABNORMAL HIGH (ref 4.0–10.5)
nRBC: 0.2 % (ref 0.0–0.2)

## 2024-11-22 LAB — LACTIC ACID, PLASMA: Lactic Acid, Venous: 1.2 mmol/L (ref 0.5–1.9)

## 2024-11-22 LAB — POCT I-STAT 7, (LYTES, BLD GAS, ICA,H+H)
Acid-Base Excess: 2 mmol/L (ref 0.0–2.0)
Bicarbonate: 31.4 mmol/L — ABNORMAL HIGH (ref 20.0–28.0)
Calcium, Ion: 1.22 mmol/L (ref 1.15–1.40)
HCT: 30 % — ABNORMAL LOW (ref 39.0–52.0)
Hemoglobin: 10.2 g/dL — ABNORMAL LOW (ref 13.0–17.0)
O2 Saturation: 81 %
Patient temperature: 36.7
Potassium: 4.4 mmol/L (ref 3.5–5.1)
Sodium: 132 mmol/L — ABNORMAL LOW (ref 135–145)
TCO2: 34 mmol/L — ABNORMAL HIGH (ref 22–32)
pCO2 arterial: 77.8 mmHg (ref 32–48)
pH, Arterial: 7.212 — ABNORMAL LOW (ref 7.35–7.45)
pO2, Arterial: 56 mmHg — ABNORMAL LOW (ref 83–108)

## 2024-11-22 LAB — PHOSPHORUS: Phosphorus: 2.8 mg/dL (ref 2.5–4.6)

## 2024-11-22 LAB — MAGNESIUM: Magnesium: 2 mg/dL (ref 1.7–2.4)

## 2024-11-22 MED ADMIN — Hydrocortisone Sodium Succinate PF For Inj 100 MG: 100 mg | INTRAVENOUS | NDC 00009001103

## 2024-11-22 MED ADMIN — Fentanyl Citrate-NaCl 0.9% IV Soln 2.5 MG/250ML: 325 ug/h | INTRAVENOUS | NDC 73177010225

## 2024-11-22 MED ADMIN — Dexmedetomidine HCl in NaCl 0.9% IV Soln 400 MCG/100ML: 0.9 ug/kg/h | INTRAVENOUS | NDC 00338955712

## 2024-11-22 MED ADMIN — Dexmedetomidine HCl in NaCl 0.9% IV Soln 400 MCG/100ML: 0.4 ug/kg/h | INTRAVENOUS | NDC 00143952501

## 2024-11-22 MED ADMIN — Dexmedetomidine HCl in NaCl 0.9% IV Soln 400 MCG/100ML: 1.2 ug/kg/h | INTRAVENOUS | NDC 00338955712

## 2024-11-22 MED ADMIN — Linezolid IV Soln 600 MG/300ML (2 MG/ML): 600 mg | INTRAVENOUS | NDC 57664068331

## 2024-11-22 MED ADMIN — Linezolid IV Soln 600 MG/300ML (2 MG/ML): 600 mg | INTRAVENOUS | NDC 55150024251

## 2024-11-22 MED ADMIN — Enoxaparin Sodium Inj Soln Pref Syr 40 MG/0.4ML: 40 mg | SUBCUTANEOUS | NDC 00781324602

## 2024-11-22 MED ADMIN — CEFTRIAXONE 2 G/100 ML IVPB MIXTURE: 2 g | INTRAVENOUS | NDC 00781320990

## 2024-11-22 MED ADMIN — Pantoprazole Sodium For IV Soln 40 MG (Base Equiv): 40 mg | INTRAVENOUS | NDC 00008092351

## 2024-11-22 MED FILL — Dexmedetomidine HCl in NaCl 0.9% IV Soln 400 MCG/100ML: 0.0000 ug/kg/h | INTRAVENOUS | Qty: 100 | Status: AC

## 2024-11-22 NOTE — Plan of Care (Signed)
" °  Problem: Clinical Measurements: Goal: Ability to maintain clinical measurements within normal limits will improve Outcome: Progressing Goal: Will remain free from infection Outcome: Progressing Goal: Diagnostic test results will improve Outcome: Progressing Goal: Cardiovascular complication will be avoided Outcome: Progressing   Problem: Pain Managment: Goal: General experience of comfort will improve and/or be controlled Outcome: Progressing   Problem: Safety: Goal: Ability to remain free from injury will improve Outcome: Progressing   Problem: Tissue Perfusion: Goal: Adequacy of tissue perfusion will improve Outcome: Progressing   Problem: Education: Goal: Knowledge of General Education information will improve Description: Including pain rating scale, medication(s)/side effects and non-pharmacologic comfort measures Outcome: Not Progressing   Problem: Health Behavior/Discharge Planning: Goal: Ability to manage health-related needs will improve Outcome: Not Progressing   Problem: Clinical Measurements: Goal: Respiratory complications will improve Outcome: Not Progressing   Problem: Activity: Goal: Risk for activity intolerance will decrease Outcome: Not Progressing   Problem: Nutrition: Goal: Adequate nutrition will be maintained Outcome: Not Progressing   Problem: Coping: Goal: Level of anxiety will decrease Outcome: Not Progressing   Problem: Elimination: Goal: Will not experience complications related to bowel motility Outcome: Not Progressing Goal: Will not experience complications related to urinary retention Outcome: Not Progressing   Problem: Skin Integrity: Goal: Risk for impaired skin integrity will decrease Outcome: Not Progressing   Problem: Education: Goal: Ability to describe self-care measures that may prevent or decrease complications (Diabetes Survival Skills Education) will improve Outcome: Not Progressing Goal: Individualized  Educational Video(s) Outcome: Not Progressing   Problem: Coping: Goal: Ability to adjust to condition or change in health will improve Outcome: Not Progressing   Problem: Fluid Volume: Goal: Ability to maintain a balanced intake and output will improve Outcome: Not Progressing   Problem: Health Behavior/Discharge Planning: Goal: Ability to identify and utilize available resources and services will improve Outcome: Not Progressing Goal: Ability to manage health-related needs will improve Outcome: Not Progressing   Problem: Metabolic: Goal: Ability to maintain appropriate glucose levels will improve Outcome: Not Progressing   Problem: Nutritional: Goal: Maintenance of adequate nutrition will improve Outcome: Not Progressing Goal: Progress toward achieving an optimal weight will improve Outcome: Not Progressing   Problem: Skin Integrity: Goal: Risk for impaired skin integrity will decrease Outcome: Not Progressing   "
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
## 2024-11-22 NOTE — Progress Notes (Signed)
 PT Cancellation Note  Patient Details Name: [name] MRN: [id number] DOB: 10-29-1976   Cancelled Treatment:    Reason Eval/Treat Not Completed: Patient's level of consciousness;Patient not medically ready, pt intubated and sedated, not yet appropriate per discussion with RN. Will continue to follow and evaluate as appropriate.   [name], PT, DPT   Acute Rehabilitation Department Office [phone] Secure Chat Communication Preferred   [name] 11/22/2024, 3:31 PM

## 2024-11-23 ENCOUNTER — Other Ambulatory Visit: Payer: Self-pay

## 2024-11-23 DIAGNOSIS — J9622 Acute and chronic respiratory failure with hypercapnia: Secondary | ICD-10-CM | POA: Diagnosis not present

## 2024-11-23 DIAGNOSIS — E46 Unspecified protein-calorie malnutrition: Secondary | ICD-10-CM | POA: Diagnosis not present

## 2024-11-23 DIAGNOSIS — A419 Sepsis, unspecified organism: Secondary | ICD-10-CM | POA: Diagnosis not present

## 2024-11-23 DIAGNOSIS — S21139A Puncture wound without foreign body of unspecified front wall of thorax without penetration into thoracic cavity, initial encounter: Secondary | ICD-10-CM | POA: Diagnosis not present

## 2024-11-23 DIAGNOSIS — J9621 Acute and chronic respiratory failure with hypoxia: Secondary | ICD-10-CM | POA: Diagnosis not present

## 2024-11-23 DIAGNOSIS — R6521 Severe sepsis with septic shock: Secondary | ICD-10-CM | POA: Diagnosis not present

## 2024-11-23 LAB — COMPREHENSIVE METABOLIC PANEL WITH GFR
ALT: 269 U/L — ABNORMAL HIGH (ref 0–44)
AST: 273 U/L — ABNORMAL HIGH (ref 15–41)
Albumin: 3.3 g/dL — ABNORMAL LOW (ref 3.5–5.0)
Alkaline Phosphatase: 175 U/L — ABNORMAL HIGH (ref 38–126)
Anion gap: 5 (ref 5–15)
BUN: 24 mg/dL — ABNORMAL HIGH (ref 6–20)
CO2: 31 mmol/L (ref 22–32)
Calcium: 8.4 mg/dL — ABNORMAL LOW (ref 8.9–10.3)
Chloride: 96 mmol/L — ABNORMAL LOW (ref 98–111)
Creatinine, Ser: 0.51 mg/dL — ABNORMAL LOW (ref 0.61–1.24)
GFR, Estimated: >60 mL/min
Glucose, Bld: 167 mg/dL — ABNORMAL HIGH (ref 70–99)
Potassium: 5 mmol/L (ref 3.5–5.1)
Sodium: 132 mmol/L — ABNORMAL LOW (ref 135–145)
Total Bilirubin: 0.5 mg/dL (ref 0.0–1.2)
Total Protein: 6 g/dL — ABNORMAL LOW (ref 6.5–8.1)

## 2024-11-23 LAB — POCT I-STAT 7, (LYTES, BLD GAS, ICA,H+H)
Acid-Base Excess: 1 mmol/L (ref 0.0–2.0)
Acid-Base Excess: 6 mmol/L — ABNORMAL HIGH (ref 0.0–2.0)
Bicarbonate: 31 mmol/L — ABNORMAL HIGH (ref 20.0–28.0)
Bicarbonate: 31.7 mmol/L — ABNORMAL HIGH (ref 20.0–28.0)
Calcium, Ion: 1.21 mmol/L (ref 1.15–1.40)
Calcium, Ion: 1.15 mmol/L (ref 1.15–1.40)
HCT: 30 % — ABNORMAL LOW (ref 39.0–52.0)
HCT: 26 % — ABNORMAL LOW (ref 39.0–52.0)
Hemoglobin: 10.2 g/dL — ABNORMAL LOW (ref 13.0–17.0)
Hemoglobin: 8.8 g/dL — ABNORMAL LOW (ref 13.0–17.0)
O2 Saturation: 94 %
O2 Saturation: 99 %
Patient temperature: 37.1
Patient temperature: 36.9
Potassium: 4.7 mmol/L (ref 3.5–5.1)
Potassium: 4.4 mmol/L (ref 3.5–5.1)
Sodium: 132 mmol/L — ABNORMAL LOW (ref 135–145)
Sodium: 132 mmol/L — ABNORMAL LOW (ref 135–145)
TCO2: 33 mmol/L — ABNORMAL HIGH (ref 22–32)
TCO2: 33 mmol/L — ABNORMAL HIGH (ref 22–32)
pCO2 arterial: 82.3 mmHg (ref 32–48)
pCO2 arterial: 50.8 mmHg — ABNORMAL HIGH (ref 32–48)
pH, Arterial: 7.185 — CL (ref 7.35–7.45)
pH, Arterial: 7.403 (ref 7.35–7.45)
pO2, Arterial: 90 mmHg (ref 83–108)
pO2, Arterial: 119 mmHg — ABNORMAL HIGH (ref 83–108)

## 2024-11-23 LAB — TRIGLYCERIDES: Triglycerides: 58 mg/dL

## 2024-11-23 LAB — PHOSPHORUS: Phosphorus: 2.2 mg/dL — ABNORMAL LOW (ref 2.5–4.6)

## 2024-11-23 LAB — GLUCOSE, CAPILLARY
Glucose-Capillary: 168 mg/dL — ABNORMAL HIGH (ref 70–99)
Glucose-Capillary: 161 mg/dL — ABNORMAL HIGH (ref 70–99)
Glucose-Capillary: 212 mg/dL — ABNORMAL HIGH (ref 70–99)
Glucose-Capillary: 104 mg/dL — ABNORMAL HIGH (ref 70–99)
Glucose-Capillary: 181 mg/dL — ABNORMAL HIGH (ref 70–99)

## 2024-11-23 LAB — MAGNESIUM: Magnesium: 2 mg/dL (ref 1.7–2.4)

## 2024-11-23 LAB — CBC
HCT: 28.5 % — ABNORMAL LOW (ref 39.0–52.0)
Hemoglobin: 9.4 g/dL — ABNORMAL LOW (ref 13.0–17.0)
MCH: 34.8 pg — ABNORMAL HIGH (ref 26.0–34.0)
MCHC: 33 g/dL (ref 30.0–36.0)
MCV: 105.6 fL — ABNORMAL HIGH (ref 80.0–100.0)
Platelets: 108 K/uL — ABNORMAL LOW (ref 150–400)
RBC: 2.7 MIL/uL — ABNORMAL LOW (ref 4.22–5.81)
RDW: 12.9 % (ref 11.5–15.5)
WBC: 22.4 K/uL — ABNORMAL HIGH (ref 4.0–10.5)
nRBC: 0.4 % — ABNORMAL HIGH (ref 0.0–0.2)

## 2024-11-23 MED ORDER — FOLIC ACID 1 MG PO TABS
1.0000 mg | ORAL_TABLET | Freq: Every day | ORAL | Status: DC
  Administered 2024-11-23 – 2024-12-01 (×9): 1 mg
  Filled 2024-11-23 (×3): qty 1

## 2024-11-23 MED ORDER — POLYETHYLENE GLYCOL 3350 17 G PO PACK
17.0000 g | PACK | Freq: Every day | ORAL | Status: DC
  Administered 2024-11-23 – 2024-11-24 (×2): 17 g
  Filled 2024-11-23 (×2): qty 1

## 2024-11-23 MED ORDER — ONDANSETRON HCL 4 MG/2ML IJ SOLN
4.0000 mg | Freq: Four times a day (QID) | INTRAMUSCULAR | Status: DC | PRN
  Administered 2024-11-23 – 2024-11-24 (×2): 4 mg via INTRAVENOUS
  Filled 2024-11-23 (×2): qty 2

## 2024-11-23 MED ORDER — FAMOTIDINE 20 MG PO TABS
20.0000 mg | ORAL_TABLET | Freq: Two times a day (BID) | ORAL | Status: DC
  Administered 2024-11-23 – 2024-12-01 (×17): 20 mg
  Filled 2024-11-23 (×6): qty 1

## 2024-11-23 MED ORDER — ADULT MULTIVITAMIN W/MINERALS CH
1.0000 | ORAL_TABLET | Freq: Every day | ORAL | Status: DC
  Administered 2024-11-23 – 2024-12-01 (×9): 1
  Filled 2024-11-23 (×3): qty 1

## 2024-11-23 MED ADMIN — Ondansetron HCl Inj 4 MG/2ML (2 MG/ML): 4 mg | INTRAVENOUS | NDC 00409475518

## 2024-11-23 MED ADMIN — Methylprednisolone Sod Succ For Inj 40 MG (Base Equiv): 40 mg | INTRAVENOUS | NDC 00009003930

## 2024-11-23 MED ADMIN — Fentanyl Citrate-NaCl 0.9% IV Soln 2.5 MG/250ML: 200 ug/h | INTRAVENOUS | NDC 73177010225

## 2024-11-23 MED ADMIN — Fentanyl Citrate-NaCl 0.9% IV Soln 2.5 MG/250ML: 275 ug/h | INTRAVENOUS | NDC 73177010225

## 2024-11-23 MED ADMIN — Dexmedetomidine HCl in NaCl 0.9% IV Soln 400 MCG/100ML: 1 ug/kg/h | INTRAVENOUS | NDC 00338955712

## 2024-11-23 MED ADMIN — Dexmedetomidine HCl in NaCl 0.9% IV Soln 400 MCG/100ML: 1.2 ug/kg/h | INTRAVENOUS | NDC 00338955712

## 2024-11-23 MED ADMIN — Dexmedetomidine HCl in NaCl 0.9% IV Soln 400 MCG/100ML: 1.1 ug/kg/h | INTRAVENOUS | NDC 00338955712

## 2024-11-23 MED ADMIN — Linezolid IV Soln 600 MG/300ML (2 MG/ML): 600 mg | INTRAVENOUS | NDC 57664068331

## 2024-11-23 MED ADMIN — Enoxaparin Sodium Inj Soln Pref Syr 40 MG/0.4ML: 40 mg | SUBCUTANEOUS | NDC 00781324602

## 2024-11-23 MED ADMIN — Thiamine Mononitrate Tab 100 MG: 100 mg | NDC 54629005701

## 2024-11-23 MED ADMIN — CEFTRIAXONE 2 G/100 ML IVPB MIXTURE: 2 g | INTRAVENOUS | NDC 00781320990

## 2024-11-23 MED ADMIN — Oxycodone HCl Tab 5 MG: 5 mg | NDC 00406055223

## 2024-11-23 MED ADMIN — Sennosides Tab 8.6 MG: 17.2 mg | NDC 00904725280

## 2024-11-23 MED ADMIN — Furosemide Inj 10 MG/ML: 40 mg | INTRAVENOUS | NDC 25021032004

## 2024-11-23 MED ADMIN — Quetiapine Fumarate Tab 50 MG: 50 mg | NDC 67877024901

## 2024-11-23 MED ADMIN — Nutritional Supplement Liquid: 1000 mL | NDC 7007462716

## 2024-11-23 MED FILL — Furosemide Inj 10 MG/ML: 40.0000 mg | INTRAMUSCULAR | Qty: 4 | Status: AC

## 2024-11-23 MED FILL — Ondansetron HCl Inj 4 MG/2ML (2 MG/ML): 4.0000 mg | INTRAMUSCULAR | Qty: 2 | Status: AC

## 2024-11-23 MED FILL — Sennosides Tab 8.6 MG: 2.0000 | ORAL | Qty: 2 | Status: AC

## 2024-11-23 MED FILL — Methylprednisolone Sod Succ For Inj PF 40 MG (Base Equiv): 40.0000 mg | INTRAMUSCULAR | Qty: 1 | Status: AC

## 2024-11-23 MED FILL — Thiamine Mononitrate Tab 100 MG: 100.0000 mg | ORAL | Qty: 1 | Status: AC

## 2024-11-23 MED FILL — Quetiapine Fumarate Tab 50 MG: 50.0000 mg | ORAL | Qty: 1 | Status: AC

## 2024-11-23 MED FILL — Oxycodone HCl Tab 5 MG: 5.0000 mg | ORAL | Qty: 1 | Status: AC
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
## 2024-11-23 NOTE — Progress Notes (Signed)
 PT Cancellation Note  Patient Details Name: [name] MRN: [id number] DOB: Aug 28, 1976   Cancelled Treatment:    Reason Eval/Treat Not Completed: Patient not medically ready at this time, increasingly agitated with any attempt to wean sedation at this time. Will continue to follow and evaluate as appropriate.   [name], PT, DPT   Acute Rehabilitation Department Office [phone] Secure Chat Communication Preferred   [name] 11/23/2024, 10:03 AM

## 2024-11-23 NOTE — Plan of Care (Signed)
 Patient remains on ventilator. Vent changes made today based off from ABG. Peep decreased and FiO2 decreased and rate increased. Tube feedings changed today and rates starting to increase. Has not had a BM as of yet. Miralax  and senokot as well as Docusate given. Blood sugars improving. Slowly weaning on sedation. Precedex  at 1.2 and Fentanyl  now down to 75 and continuing to decrease. Has been cooperative today with weaning. Easily  agitates but then calms back down once reoriented as to what is going on and what we are doing. Family updated on status.

## 2024-11-23 NOTE — Progress Notes (Signed)
 OT Cancellation Note  Patient Details Name: [name] MRN: [id number] DOB: Mar 05, 1976   Cancelled Treatment:    Reason Eval/Treat Not Completed: Patient not medically ready;Patient's level of consciousness (sedation on vent) (Currently with fentanyl  and precedex  that will need to be reduced for therapy readiness )  [name] 11/23/2024, 6:53 AM

## 2024-11-23 NOTE — Progress Notes (Signed)
 SLP Cancellation Note  Patient Details Name: [name] MRN: [id number] DOB: 15-Feb-1976   Cancelled treatment:       Reason Eval/Treat Not Completed: Patient not medically ready (sedated on vent). Will f/u as able.    Leita SAILOR., M.A. CCC-SLP Acute Rehabilitation Services Office: [phone]  Secure chat preferred  11/23/2024, 7:38 AM

## 2024-11-23 NOTE — Plan of Care (Signed)
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
## 2024-11-23 NOTE — Consult Note (Signed)
 WOC Nurse Consult Note:  WOC consult performed remotely utilizing imaging and chart review Reason for Consult: DTI to sacrum/coccyx/buttocks  Wound type: Deep tissue pressure injury to sacrum R hip/lateral thigh abrasion  R buttocks deep tissue pressure injury Pressure Injury POA: No Measurement: see nursing flow sheets Wound bed:  Sacrum/ R buttocks: deep purple maroon discoloration R hip: pink.red, dry Drainage (amount, consistency, odor) see nursing flow sheets Periwound: intact Dressing procedure/placement/frequency:  Cleanse with NS, pat dry. Apply Xeroform to wound bed and cover with silicone foam dressing.  Change daily            WOC Nurse team will follow with you and see patient within 10 days for wound assessments.  Please notify WOC nurses of any acute changes in the wounds or any new areas of concern  Thank you,  Doyal Polite, MSN, RN, [facility] WOC Team [phone] (Available Mon-Fri 0700-1500)

## 2024-11-24 DIAGNOSIS — S21139A Puncture wound without foreign body of unspecified front wall of thorax without penetration into thoracic cavity, initial encounter: Secondary | ICD-10-CM | POA: Diagnosis not present

## 2024-11-24 DIAGNOSIS — J9621 Acute and chronic respiratory failure with hypoxia: Secondary | ICD-10-CM | POA: Diagnosis not present

## 2024-11-24 DIAGNOSIS — M7989 Other specified soft tissue disorders: Secondary | ICD-10-CM

## 2024-11-24 DIAGNOSIS — J9622 Acute and chronic respiratory failure with hypercapnia: Secondary | ICD-10-CM | POA: Diagnosis not present

## 2024-11-24 DIAGNOSIS — A419 Sepsis, unspecified organism: Secondary | ICD-10-CM | POA: Diagnosis not present

## 2024-11-24 LAB — CULTURE, RESPIRATORY W GRAM STAIN

## 2024-11-24 LAB — POCT I-STAT 7, (LYTES, BLD GAS, ICA,H+H)
Acid-Base Excess: 10 mmol/L — ABNORMAL HIGH (ref 0.0–2.0)
Bicarbonate: 35.4 mmol/L — ABNORMAL HIGH (ref 20.0–28.0)
Calcium, Ion: 1.07 mmol/L — ABNORMAL LOW (ref 1.15–1.40)
HCT: 29 % — ABNORMAL LOW (ref 39.0–52.0)
Hemoglobin: 9.9 g/dL — ABNORMAL LOW (ref 13.0–17.0)
O2 Saturation: 98 %
Patient temperature: 37.7
Potassium: 4 mmol/L (ref 3.5–5.1)
Sodium: 138 mmol/L (ref 135–145)
TCO2: 37 mmol/L — ABNORMAL HIGH (ref 22–32)
pCO2 arterial: 50.2 mmHg — ABNORMAL HIGH (ref 32–48)
pH, Arterial: 7.459 — ABNORMAL HIGH (ref 7.35–7.45)
pO2, Arterial: 101 mmHg (ref 83–108)

## 2024-11-24 LAB — COMPREHENSIVE METABOLIC PANEL WITH GFR
ALT: 192 U/L — ABNORMAL HIGH (ref 0–44)
AST: 100 U/L — ABNORMAL HIGH (ref 15–41)
Albumin: 3 g/dL — ABNORMAL LOW (ref 3.5–5.0)
Alkaline Phosphatase: 162 U/L — ABNORMAL HIGH (ref 38–126)
Anion gap: 7 (ref 5–15)
BUN: 24 mg/dL — ABNORMAL HIGH (ref 6–20)
CO2: 35 mmol/L — ABNORMAL HIGH (ref 22–32)
Calcium: 8 mg/dL — ABNORMAL LOW (ref 8.9–10.3)
Chloride: 94 mmol/L — ABNORMAL LOW (ref 98–111)
Creatinine, Ser: 0.4 mg/dL — ABNORMAL LOW (ref 0.61–1.24)
GFR, Estimated: >60 mL/min
Glucose, Bld: 158 mg/dL — ABNORMAL HIGH (ref 70–99)
Potassium: 3 mmol/L — ABNORMAL LOW (ref 3.5–5.1)
Sodium: 136 mmol/L (ref 135–145)
Total Bilirubin: 0.4 mg/dL (ref 0.0–1.2)
Total Protein: 5.5 g/dL — ABNORMAL LOW (ref 6.5–8.1)

## 2024-11-24 LAB — BASIC METABOLIC PANEL WITH GFR
Anion gap: 17 — ABNORMAL HIGH (ref 5–15)
BUN: 25 mg/dL — ABNORMAL HIGH (ref 6–20)
CO2: 33 mmol/L — ABNORMAL HIGH (ref 22–32)
Calcium: 7.8 mg/dL — ABNORMAL LOW (ref 8.9–10.3)
Chloride: 93 mmol/L — ABNORMAL LOW (ref 98–111)
Creatinine, Ser: 0.54 mg/dL — ABNORMAL LOW (ref 0.61–1.24)
GFR, Estimated: >60 mL/min
Glucose, Bld: 218 mg/dL — ABNORMAL HIGH (ref 70–99)
Potassium: 4.6 mmol/L (ref 3.5–5.1)
Sodium: 143 mmol/L (ref 135–145)

## 2024-11-24 LAB — CBC
HCT: 26.2 % — ABNORMAL LOW (ref 39.0–52.0)
Hemoglobin: 8.9 g/dL — ABNORMAL LOW (ref 13.0–17.0)
MCH: 34.1 pg — ABNORMAL HIGH (ref 26.0–34.0)
MCHC: 34 g/dL (ref 30.0–36.0)
MCV: 100.4 fL — ABNORMAL HIGH (ref 80.0–100.0)
Platelets: 60 K/uL — ABNORMAL LOW (ref 150–400)
RBC: 2.61 MIL/uL — ABNORMAL LOW (ref 4.22–5.81)
RDW: 12.7 % (ref 11.5–15.5)
WBC: 11.7 K/uL — ABNORMAL HIGH (ref 4.0–10.5)
nRBC: 0.2 % (ref 0.0–0.2)

## 2024-11-24 LAB — CK: Total CK: 167 U/L (ref 49–397)

## 2024-11-24 LAB — C-REACTIVE PROTEIN: CRP: 8.7 mg/dL — ABNORMAL HIGH

## 2024-11-24 LAB — BLOOD GAS, ARTERIAL
Acid-Base Excess: 8.5 mmol/L — ABNORMAL HIGH (ref 0.0–2.0)
Bicarbonate: 35.3 mmol/L — ABNORMAL HIGH (ref 20.0–28.0)
Drawn by: 72304
O2 Saturation: 99.4 %
Patient temperature: 36.6
pCO2 arterial: 56 mmHg — ABNORMAL HIGH (ref 32–48)
pH, Arterial: 7.41 (ref 7.35–7.45)
pO2, Arterial: 147 mmHg — ABNORMAL HIGH (ref 83–108)

## 2024-11-24 LAB — TROPONIN T, HIGH SENSITIVITY
Troponin T High Sensitivity: 30 ng/L — ABNORMAL HIGH (ref 0–19)
Troponin T High Sensitivity: 29 ng/L — ABNORMAL HIGH (ref 0–19)

## 2024-11-24 LAB — GLUCOSE, CAPILLARY
Glucose-Capillary: 146 mg/dL — ABNORMAL HIGH (ref 70–99)
Glucose-Capillary: 117 mg/dL — ABNORMAL HIGH (ref 70–99)
Glucose-Capillary: 173 mg/dL — ABNORMAL HIGH (ref 70–99)
Glucose-Capillary: 191 mg/dL — ABNORMAL HIGH (ref 70–99)
Glucose-Capillary: 214 mg/dL — ABNORMAL HIGH (ref 70–99)
Glucose-Capillary: 162 mg/dL — ABNORMAL HIGH (ref 70–99)

## 2024-11-24 LAB — LACTIC ACID, PLASMA: Lactic Acid, Venous: 1.6 mmol/L (ref 0.5–1.9)

## 2024-11-24 LAB — LACTATE DEHYDROGENASE: LDH: 315 U/L — ABNORMAL HIGH (ref 105–235)

## 2024-11-24 LAB — MAGNESIUM: Magnesium: 1.9 mg/dL (ref 1.7–2.4)

## 2024-11-24 LAB — PHOSPHORUS: Phosphorus: 1.1 mg/dL — ABNORMAL LOW (ref 2.5–4.6)

## 2024-11-24 MED ORDER — VANCOMYCIN HCL IN DEXTROSE 1-5 GM/200ML-% IV SOLN
1000.0000 mg | Freq: Two times a day (BID) | INTRAVENOUS | Status: DC
  Administered 2024-11-24: 1000 mg via INTRAVENOUS
  Filled 2024-11-24: qty 200

## 2024-11-24 MED ORDER — POLYETHYLENE GLYCOL 3350 17 G PO PACK: 17.0000 g | PACK | Freq: Every day | ORAL | Status: DC | PRN

## 2024-11-24 MED ORDER — HALOPERIDOL LACTATE 5 MG/ML IJ SOLN
5.0000 mg | Freq: Four times a day (QID) | INTRAMUSCULAR | Status: DC | PRN
  Administered 2024-11-24 – 2024-11-28 (×3): 5 mg via INTRAVENOUS
  Filled 2024-11-24: qty 1

## 2024-11-24 MED ORDER — LEVOTHYROXINE SODIUM 25 MCG PO TABS
125.0000 ug | ORAL_TABLET | Freq: Every day | ORAL | Status: DC
  Administered 2024-11-24 – 2024-12-01 (×8): 125 ug
  Filled 2024-11-24 (×2): qty 1

## 2024-11-24 MED ORDER — THIAMINE MONONITRATE 100 MG PO TABS
100.0000 mg | ORAL_TABLET | Freq: Every day | ORAL | Status: DC
  Administered 2024-11-29 – 2024-12-01 (×3): 100 mg

## 2024-11-24 MED ORDER — GABAPENTIN 250 MG/5ML PO SOLN
300.0000 mg | Freq: Every day | ORAL | Status: DC
  Administered 2024-11-24 – 2024-11-30 (×7): 300 mg
  Filled 2024-11-24 (×3): qty 6

## 2024-11-24 MED ADMIN — THIAMINE 500MG IN NORMAL SALINE (50ML) IVPB: 500 mg | INTRAVENOUS | NDC 63323001302

## 2024-11-24 MED ADMIN — THIAMINE 500MG IN NORMAL SALINE (50ML) IVPB: 500 mg | INTRAVENOUS | NDC 00641622825

## 2024-11-24 MED ADMIN — Fentanyl Citrate-NaCl 0.9% IV Soln 2.5 MG/250ML: 50 ug/h | INTRAVENOUS | NDC 73177010225

## 2024-11-24 MED ADMIN — Fentanyl Citrate-NaCl 0.9% IV Soln 2.5 MG/250ML: 200 ug/h | INTRAVENOUS | NDC 73177010225

## 2024-11-24 MED ADMIN — Clonazepam Tab 1 MG: 1 mg | NDC 43547040711

## 2024-11-24 MED ADMIN — Potassium Chloride Powder Packet 20 mEq: 40 meq | NDC 00245036089

## 2024-11-24 MED ADMIN — Methylprednisolone Sod Succ For Inj 40 MG (Base Equiv): 40 mg | INTRAVENOUS | NDC 00009003930

## 2024-11-24 MED ADMIN — Piperacillin Sod-Tazobactam Sod in Dex IV Sol 3-0.375GM/50ML: 3.375 g | INTRAVENOUS | NDC 00338963601

## 2024-11-24 MED ADMIN — Magnesium Sulfate IV Soln 2 GM/50ML (40 MG/ML): 2 g | INTRAVENOUS | NDC 00409672911

## 2024-11-24 MED ADMIN — Quetiapine Fumarate Tab 50 MG: 50 mg | NDC 67877024901

## 2024-11-24 MED ADMIN — Dexmedetomidine HCl in NaCl 0.9% IV Soln 400 MCG/100ML: 1.2 ug/kg/h | INTRAVENOUS | NDC 00338955712

## 2024-11-24 MED ADMIN — Fentanyl Citrate PF Soln Prefilled Syringe 50 MCG/ML: 50 ug | INTRAVENOUS | NDC 63323080801

## 2024-11-24 MED ADMIN — Sorbitol Solution (Bulk): 30 mL | ORAL | NDC 46287050030

## 2024-11-24 MED ADMIN — Fentanyl Citrate PF Soln Prefilled Syringe 50 MCG/ML: 100 ug | INTRAVENOUS | NDC 63323080801

## 2024-11-24 MED ADMIN — Quetiapine Fumarate Tab 100 MG: 100 mg | NDC 29300014901

## 2024-11-24 MED ADMIN — Nutritional Supplement Liquid: 1000 mL | NDC 7007462716

## 2024-11-24 MED ADMIN — Enoxaparin Sodium Inj Soln Pref Syr 40 MG/0.4ML: 40 mg | SUBCUTANEOUS | NDC 00781324602

## 2024-11-24 MED ADMIN — Furosemide Inj 10 MG/ML: 40 mg | INTRAVENOUS | NDC 71288020304

## 2024-11-24 MED ADMIN — Midazolam HCl Inj PF 2 MG/2ML (Base Equivalent): 2 mg | INTRAVENOUS | NDC 00409000125

## 2024-11-24 MED ADMIN — Sennosides Tab 8.6 MG: 17.2 mg | NDC 00904725261

## 2024-11-24 MED ADMIN — Vancomycin HCl IV Soln 1500 MG/300ML (Base Equivalent): 1500 mg | INTRAVENOUS | NDC 70594004301

## 2024-11-24 MED ADMIN — POTASSIUM PHOSPHATE HIGH DOSE (MMOL) IVPB: 30 mmol | INTRAVENOUS | NDC 65219005409

## 2024-11-24 MED FILL — Thiamine HCl Inj 100 MG/ML: 500.0000 mg | INTRAMUSCULAR | Qty: 5 | Status: AC

## 2024-11-24 MED FILL — Thiamine HCl Inj 100 MG/ML: 500.0000 mg | INTRAMUSCULAR | Qty: 500 | Status: AC

## 2024-11-24 MED FILL — Potassium Chloride Powder Packet 20 mEq: 40.0000 meq | ORAL | Qty: 2 | Status: AC

## 2024-11-24 MED FILL — Fentanyl Citrate PF Soln Prefilled Syringe 50 MCG/ML: 50.0000 ug | INTRAMUSCULAR | Qty: 1 | Status: AC

## 2024-11-24 MED FILL — Magnesium Sulfate IV Soln 2 GM/50ML (40 MG/ML): 2.0000 g | INTRAVENOUS | Qty: 50 | Status: AC

## 2024-11-24 MED FILL — Vancomycin HCl IV Soln 1250 MG/250ML (Base Equivalent): 1250.0000 mg | INTRAVENOUS | Qty: 250 | Status: AC

## 2024-11-24 MED FILL — Fentanyl Citrate-NaCl 0.9% IV Soln 2.5 MG/250ML: 0.0000 ug/h | INTRAVENOUS | Qty: 250 | Status: AC

## 2024-11-24 MED FILL — Midazolam HCl Inj 2 MG/2ML (Base Equivalent): INTRAMUSCULAR | Qty: 2 | Status: AC

## 2024-11-24 MED FILL — Potassium Phosphates Inj 45 mM/15ML (Phos) 66 mEq/15ML (K): 30.0000 mmol | INTRAVENOUS | Qty: 10 | Status: AC

## 2024-11-24 MED FILL — Piperacillin Sod-Tazobactam Sod in Dex IV Sol 3-0.375GM/50ML: 3.3750 g | INTRAVENOUS | Qty: 50 | Status: AC

## 2024-11-24 MED FILL — Quetiapine Fumarate Tab 100 MG: 100.0000 mg | ORAL | Qty: 1 | Status: AC

## 2024-11-24 MED FILL — Sorbitol Solution (Bulk): 30.0000 mL | Qty: 30 | Status: AC

## 2024-11-24 MED FILL — Furosemide Inj 10 MG/ML: 40.0000 mg | INTRAMUSCULAR | Qty: 4 | Status: AC

## 2024-11-24 MED FILL — Quetiapine Fumarate Tab 50 MG: 50.0000 mg | ORAL | Qty: 1 | Status: AC

## 2024-11-24 MED FILL — Vancomycin HCl IV Soln 1500 MG/300ML (Base Equivalent): 1500.0000 mg | INTRAVENOUS | Qty: 300 | Status: AC

## 2024-11-24 MED FILL — Clonazepam Tab 1 MG: 1.0000 mg | ORAL | Qty: 1 | Status: AC

## 2024-11-24 NOTE — Progress Notes (Addendum)
 Pharmacy Antibiotic Note  [name] is a 49 y.o. male admitted on 11/19/2024 with pneumonia.  Pharmacy has been consulted for vancomycin  dosing.  Plan: Vancomycin  1500 mg IV once, then every 1000 mg every 8 hours.   eAUC 483, goal 400-600.  Peak/trough at Steady state  Monitor renal function and clinical progression.   Height: 5' 3 (160 cm) Weight: 56.8 kg (125 lb 3.5 oz) IBW/kg (Calculated) : 56.9  Temp (24hrs), Avg:98 F (36.7 C), Min:97 F (36.1 C), Max:100.6 F (38.1 C)  Recent Labs  Lab 11/20/24 1407 11/20/24 2104 11/21/24 0333 11/21/24 0615 11/22/24 0303 11/23/24 0356 11/24/24 0210 11/24/24 0940  WBC 11.1*  --  25.5*  --  25.9* 22.4* 11.7*  --   CREATININE 0.73  --  0.74  --  0.54* 0.51* 0.40*  --   LATICACIDVEN  --  1.2 1.8 1.7 1.2  --   --  1.6    Estimated Creatinine Clearance: 88.8 mL/min (A) (by C-G formula based on SCr of 0.4 mg/dL (L)).    Allergies[1]  Antimicrobials this admission: CRO 1/1 >>1/6 Linezolid  1/2 >> 1/6  Vancomycin  1/6 >>  Zosyn  1/6 >> Doxy 1/2 x1  Microbiology results: 1/6 Sputum: pending  1/2 Respiratory: GPC Staph Aureus, susceptibilities pending  1/1 MRSA PCR: negative 1/1 Resp Panel negative   Thank you for allowing pharmacy to be a part of this patients care.  Jouri Threat M Trenika Hudson 11/24/2024 11:22 AM     [1] No Known Allergies

## 2024-11-24 NOTE — Progress Notes (Signed)
 Fluid and Electrolyte orders:   Hypophosphatemia: PO4 1.1 Hypokalemia: K 3.0    Plan 30mm Kphos

## 2024-11-24 NOTE — Procedures (Signed)
 Bedside Bronchoscopy Procedure Note [name] [id number] [birth year]  Procedure: Bronchoscopy Indications: Diagnostic evaluation of the airways, Obtain specimens for culture and/or other diagnostic studies, and Remove secretions  Procedure Details: ET Tube Size: ET Tube secured at lip (cm): Bite block in place: No In preparation for procedure, Patient hyper-oxygenated with 100 % FiO2 and Saline given via ETT (55 ml) Airway entered and the following bronchi were examined: RUL, RML, RLL, LUL, LLL, and Bronchi.   Bronchoscope removed.    Evaluation BP (!) 161/97   Pulse (!) 113   Temp 99.1 F (37.3 C)   Resp 18   Ht 5' 3 (1.6 m)   Wt 56.8 kg   SpO2 90%   BMI 22.18 kg/m  Breath Sounds:Rhonch O2 sats: stable throughout Patient's Current Condition: stable Specimens:  Sent serosanguinous fluid Complications: No apparent complications Patient did tolerate procedure well.   Narely [name] 11/24/2024, 9:22 AM

## 2024-11-24 NOTE — Plan of Care (Signed)
" °  Problem: Clinical Measurements: Goal: Diagnostic test results will improve Outcome: Progressing   Problem: Elimination: Goal: Will not experience complications related to urinary retention Outcome: Progressing   Problem: Safety: Goal: Ability to remain free from injury will improve Outcome: Progressing   Problem: Fluid Volume: Goal: Ability to maintain a balanced intake and output will improve Outcome: Progressing   Problem: Metabolic: Goal: Ability to maintain appropriate glucose levels will improve Outcome: Progressing   Problem: Tissue Perfusion: Goal: Adequacy of tissue perfusion will improve Outcome: Progressing   Problem: Clinical Measurements: Goal: Ability to maintain clinical measurements within normal limits will improve Outcome: Not Progressing Goal: Will remain free from infection Outcome: Not Progressing Goal: Respiratory complications will improve Outcome: Not Progressing Goal: Cardiovascular complication will be avoided Outcome: Not Progressing   Problem: Activity: Goal: Risk for activity intolerance will decrease Outcome: Not Progressing   Problem: Nutrition: Goal: Adequate nutrition will be maintained Outcome: Not Progressing   Problem: Coping: Goal: Level of anxiety will decrease Outcome: Not Progressing   Problem: Elimination: Goal: Will not experience complications related to bowel motility Outcome: Not Progressing   Problem: Pain Managment: Goal: General experience of comfort will improve and/or be controlled Outcome: Not Progressing   Problem: Skin Integrity: Goal: Risk for impaired skin integrity will decrease Outcome: Not Progressing   Problem: Nutritional: Goal: Maintenance of adequate nutrition will improve Outcome: Not Progressing Goal: Progress toward achieving an optimal weight will improve Outcome: Not Progressing   Problem: Skin Integrity: Goal: Risk for impaired skin integrity will decrease Outcome: Not Progressing    "

## 2024-11-24 NOTE — Progress Notes (Signed)
 OT Cancellation Note  Patient Details Name: [name] MRN: [id number] DOB: 07-02-1976   Cancelled Treatment:    Reason Eval/Treat Not Completed: Patient not medically ready (intubated with sedation (fentanyl  and precedex  on board)  [name] 11/24/2024, 5:50 AM

## 2024-11-24 NOTE — Procedures (Signed)
 Bronchoscopy Procedure Note  Jarry Manon  [id number]  1976/01/08  Date:11/24/2024  Time:9:05 AM   Provider Performing:Destanie Tibbetts   Procedure(s):  Initial Therapeutic Aspiration of Tracheobronchial Tree [phone])  Indication(s) Thick inline secretions and agitation. Acute respiratory failure.  Consent Unable to obtain consent due to emergent nature of procedure.  Anesthesia 4 mg of Versed  and 100 mcg of fentanyl  IV.  Patient is on Precedex .   Time Out Verified patient identification, verified procedure, site/side was marked, verified correct patient position, special equipment/implants available, medications/allergies/relevant history reviewed, required imaging and test results available.   Sterile Technique Usual hand hygiene, masks, gowns, and gloves were used   Procedure Description Bronchoscope advanced through endotracheal tube and into airway.  Airways were examined down to subsegmental level with findings noted below.   Following diagnostic evaluation, BAL(s) performed in 30 with normal saline and return of 20 fluid  Findings: Thick mucus secretions in the right upper lobe which were saline agitated and a BAL is performed in the right upper lobe.  30 cc of saline is infused and 20 cc return.  This will be sent for cell count cultures including fungal cultures and AFB.  After the BAL bronchus intermedius right middle and lower lobe were inspected.  There is thick mucus secretions in the right lower lobe which again was saline agitated and aspirated.  Left mainstem and upper lobe are clear.  Left lower lobe has some thin secretions which were suctioned out.   Complications/Tolerance None; patient tolerated the procedure well. Chest X-ray is needed post procedure.   EBL Minimal   Specimen(s) BAL from right upper lobe sent for cell count, bacterial cultures, fungal cultures and AFB.   Tamela Stakes, MD  Attending Physician, Critical Care Medicine [place]  Pulmonary Critical Care See Amion for pager If no response to pager, please call [phone] until 7pm After 7pm, Please call E-link [phone]
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
## 2024-11-24 NOTE — Progress Notes (Signed)
 Continues to report pain to nursing staff Plan Added Neurontin  via tube nightly

## 2024-11-24 NOTE — Progress Notes (Signed)
 Called to bedside Pt restless and anxious. Nursing staff increased Dex w/ little improvement but did result in BP drop and needed to be started on NE.  W/ BP drop and restlessness EKG on monitor alarming ST changes.  12 lead reviewed.  ST w/ occ PAC. Reading as mobitz I but P wave interval looks pretty consistent except for the PACs. Also showing some non-specific ant lead changes.  W/ hypotension certainly could be some degree of demand ischemia but he is not a cardiac cath candidate at this point Already has ECHO from 3rd. Has Severe RV dysfxn in which case Maintaining MAP important to ensure Coronaries perfused.  Plan Ensure MAP > 65 NE as needed Cycle CEs.  Needs better anxiolytic control. Will add PRN Clonazepam    Maude FORBES [facility]anner ACNP-BC [facility] Pulmonary/Critical Care Pager # [phone] OR # [phone] if no answer

## 2024-11-24 NOTE — Progress Notes (Signed)
 PT Cancellation Note  Patient Details Name: [name] MRN: [id number] DOB: [birth year]   Cancelled Treatment:    Reason Eval/Treat Not Completed: Patient not medically ready;Patient's level of consciousness. Per discussion with RN, pt recently sedated due to agitation, not yet appropriate for PT evaluation.   [name], PT, DPT   Acute Rehabilitation Department Office [phone] Secure Chat Communication Preferred   [name] 11/24/2024, 12:35 PM

## 2024-11-25 DIAGNOSIS — S21139A Puncture wound without foreign body of unspecified front wall of thorax without penetration into thoracic cavity, initial encounter: Secondary | ICD-10-CM | POA: Diagnosis not present

## 2024-11-25 DIAGNOSIS — J9622 Acute and chronic respiratory failure with hypercapnia: Secondary | ICD-10-CM | POA: Diagnosis not present

## 2024-11-25 DIAGNOSIS — J9621 Acute and chronic respiratory failure with hypoxia: Secondary | ICD-10-CM | POA: Diagnosis not present

## 2024-11-25 DIAGNOSIS — A419 Sepsis, unspecified organism: Secondary | ICD-10-CM | POA: Diagnosis not present

## 2024-11-25 LAB — BODY FLUID CELL COUNT WITH DIFFERENTIAL
Eos, Fluid: 0 %
Lymphs, Fluid: 0 %
Monocyte-Macrophage-Serous Fluid: 3 % — ABNORMAL LOW (ref 50–90)
Neutrophil Count, Fluid: 97 % — ABNORMAL HIGH (ref 0–25)
Total Nucleated Cell Count, Fluid: 6400 uL — ABNORMAL HIGH (ref 0–1000)

## 2024-11-25 LAB — COMPREHENSIVE METABOLIC PANEL WITH GFR
ALT: 168 U/L — ABNORMAL HIGH (ref 0–44)
AST: 108 U/L — ABNORMAL HIGH (ref 15–41)
Albumin: 3.1 g/dL — ABNORMAL LOW (ref 3.5–5.0)
Alkaline Phosphatase: 142 U/L — ABNORMAL HIGH (ref 38–126)
Anion gap: 6 (ref 5–15)
BUN: 20 mg/dL (ref 6–20)
CO2: 36 mmol/L — ABNORMAL HIGH (ref 22–32)
Calcium: 8.1 mg/dL — ABNORMAL LOW (ref 8.9–10.3)
Chloride: 99 mmol/L (ref 98–111)
Creatinine, Ser: 0.47 mg/dL — ABNORMAL LOW (ref 0.61–1.24)
GFR, Estimated: >60 mL/min
Glucose, Bld: 129 mg/dL — ABNORMAL HIGH (ref 70–99)
Potassium: 4.3 mmol/L (ref 3.5–5.1)
Sodium: 140 mmol/L (ref 135–145)
Total Bilirubin: 0.6 mg/dL (ref 0.0–1.2)
Total Protein: 5.4 g/dL — ABNORMAL LOW (ref 6.5–8.1)

## 2024-11-25 LAB — CBC
HCT: 25.9 % — ABNORMAL LOW (ref 39.0–52.0)
Hemoglobin: 8.5 g/dL — ABNORMAL LOW (ref 13.0–17.0)
MCH: 34.1 pg — ABNORMAL HIGH (ref 26.0–34.0)
MCHC: 32.8 g/dL (ref 30.0–36.0)
MCV: 104 fL — ABNORMAL HIGH (ref 80.0–100.0)
Platelets: 56 K/uL — ABNORMAL LOW (ref 150–400)
RBC: 2.49 MIL/uL — ABNORMAL LOW (ref 4.22–5.81)
RDW: 13.2 % (ref 11.5–15.5)
WBC: 10.9 K/uL — ABNORMAL HIGH (ref 4.0–10.5)
nRBC: 0.2 % (ref 0.0–0.2)

## 2024-11-25 LAB — GLUCOSE, CAPILLARY
Glucose-Capillary: 122 mg/dL — ABNORMAL HIGH (ref 70–99)
Glucose-Capillary: 129 mg/dL — ABNORMAL HIGH (ref 70–99)
Glucose-Capillary: 131 mg/dL — ABNORMAL HIGH (ref 70–99)
Glucose-Capillary: 137 mg/dL — ABNORMAL HIGH (ref 70–99)

## 2024-11-25 LAB — BLOOD GAS, VENOUS
Acid-Base Excess: 13.3 mmol/L — ABNORMAL HIGH (ref 0.0–2.0)
Bicarbonate: 38.9 mmol/L — ABNORMAL HIGH (ref 20.0–28.0)
O2 Saturation: 68.4 %
Patient temperature: 37
pCO2, Ven: 56 mmHg (ref 44–60)
pH, Ven: 7.45 — ABNORMAL HIGH (ref 7.25–7.43)
pO2, Ven: 40 mmHg (ref 32–45)

## 2024-11-25 LAB — PHOSPHORUS: Phosphorus: 1.8 mg/dL — ABNORMAL LOW (ref 2.5–4.6)

## 2024-11-25 LAB — MAGNESIUM: Magnesium: 2.3 mg/dL (ref 1.7–2.4)

## 2024-11-25 LAB — TSH: TSH: 2.54 u[IU]/mL (ref 0.350–4.500)

## 2024-11-25 MED ADMIN — THIAMINE 500MG IN NORMAL SALINE (50ML) IVPB: 500 mg | INTRAVENOUS | NDC 00641622825

## 2024-11-25 MED ADMIN — THIAMINE 500MG IN NORMAL SALINE (50ML) IVPB: 500 mg | INTRAVENOUS | NDC 63323001302

## 2024-11-25 MED ADMIN — THIAMINE 500MG IN NORMAL SALINE (50ML) IVPB: 500 mg | INTRAVENOUS | NDC 63323001301

## 2024-11-25 MED ADMIN — Fentanyl Citrate-NaCl 0.9% IV Soln 2.5 MG/250ML: 200 ug/h | INTRAVENOUS | NDC 73177010225

## 2024-11-25 MED ADMIN — Methylprednisolone Sod Succ For Inj 40 MG (Base Equiv): 40 mg | INTRAVENOUS | NDC 00009003930

## 2024-11-25 MED ADMIN — Piperacillin Sod-Tazobactam Sod in Dex IV Sol 3-0.375GM/50ML: 3.375 g | INTRAVENOUS | NDC 00338963601

## 2024-11-25 MED ADMIN — Piperacillin Sod-Tazobactam Sod in Dex IV Sol 3-0.375GM/50ML: 3.375 g | INTRAVENOUS | NDC 00206886101

## 2024-11-25 MED ADMIN — Quetiapine Fumarate Tab 50 MG: 50 mg | NDC 67877024901

## 2024-11-25 MED ADMIN — Fentanyl Citrate PF Soln Prefilled Syringe 50 MCG/ML: 100 ug | INTRAVENOUS | NDC 63323080801

## 2024-11-25 MED ADMIN — Quetiapine Fumarate Tab 100 MG: 100 mg | NDC 29300014901

## 2024-11-25 MED ADMIN — Enoxaparin Sodium Inj Soln Pref Syr 40 MG/0.4ML: 40 mg | SUBCUTANEOUS | NDC 00781324664

## 2024-11-25 MED ADMIN — Propofol IV Emul 1000 MG/100ML (10 MG/ML): 30 ug/kg/min | INTRAVENOUS | NDC 00069024801

## 2024-11-25 NOTE — Progress Notes (Signed)
 OT Cancellation Note  Patient Details Name: [name] MRN: [id number] DOB: [birth year]   Cancelled Treatment:    Reason Eval/Treat Not Completed: Patient not medically ready. Per RN, pt sedated, not following commands, not appropriate yet for evaluation. Will follow up as able to.   Tanazia Achee C, OT  Acute Rehabilitation Services Office [phone] Secure chat preferred   Adrianne GORMAN Savers 11/25/2024, 9:09 AM
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
## 2024-11-25 NOTE — Progress Notes (Signed)
 PT Cancellation Note  Patient Details Name: [name] MRN: [id number] DOB: 25-Aug-1976   Cancelled Treatment:    Reason Eval/Treat Not Completed: Patient not medically ready;Patient's level of consciousness, pt remains intubated and sedated, possible trach later this week. Will continue to follow and evaluate as appropriate.   [name], PT, DPT   Acute Rehabilitation Department Office [phone] Secure Chat Communication Preferred    [name] 11/25/2024, 9:17 AM

## 2024-11-25 NOTE — Procedures (Signed)
 Cortrak  Person Inserting Tube:  Mady Dolly, RD Tube Type:  Cortrak - 43 inches Tube Size:  10 Tube Location:  Left nare Secured by: Bridle Initial Placement:  Gastric Technique Used to Measure Tube Placement:  Marking at nare/corner of mouth Cortrak Secured At:  71 cm  Cortrak Tube Team Note:  Consult received to place a Cortrak feeding tube.   No x-ray is required. RN may begin using tube.   If the tube becomes dislodged please keep the tube and contact the Cortrak team at [email or web address] for replacement.  If after hours and replacement cannot be delayed, place a NG tube and confirm placement with an abdominal x-ray.   Dolly Mady MS, RD, LDN Registered Dietitian I Clinical Nutrition RD Inpatient Contact Info in Amion

## 2024-11-25 NOTE — Plan of Care (Signed)
" °  Problem: Clinical Measurements: Goal: Ability to maintain clinical measurements within normal limits will improve Outcome: Progressing Goal: Diagnostic test results will improve Outcome: Progressing Goal: Cardiovascular complication will be avoided Outcome: Progressing   Problem: Activity: Goal: Risk for activity intolerance will decrease Outcome: Progressing   Problem: Nutrition: Goal: Adequate nutrition will be maintained Outcome: Progressing   Problem: Elimination: Goal: Will not experience complications related to bowel motility Outcome: Progressing Goal: Will not experience complications related to urinary retention Outcome: Progressing   Problem: Safety: Goal: Ability to remain free from injury will improve Outcome: Progressing   Problem: Fluid Volume: Goal: Ability to maintain a balanced intake and output will improve Outcome: Progressing   Problem: Metabolic: Goal: Ability to maintain appropriate glucose levels will improve Outcome: Progressing   Problem: Nutritional: Goal: Maintenance of adequate nutrition will improve Outcome: Progressing Goal: Progress toward achieving an optimal weight will improve Outcome: Progressing   Problem: Tissue Perfusion: Goal: Adequacy of tissue perfusion will improve Outcome: Progressing   Problem: Clinical Measurements: Goal: Will remain free from infection Outcome: Not Progressing Goal: Respiratory complications will improve Outcome: Not Progressing   Problem: Coping: Goal: Level of anxiety will decrease Outcome: Not Progressing   Problem: Pain Managment: Goal: General experience of comfort will improve and/or be controlled Outcome: Not Progressing   Problem: Skin Integrity: Goal: Risk for impaired skin integrity will decrease Outcome: Not Progressing   Problem: Skin Integrity: Goal: Risk for impaired skin integrity will decrease Outcome: Not Progressing   "
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
## 2024-11-26 DIAGNOSIS — J9621 Acute and chronic respiratory failure with hypoxia: Secondary | ICD-10-CM | POA: Diagnosis not present

## 2024-11-26 DIAGNOSIS — S21139A Puncture wound without foreign body of unspecified front wall of thorax without penetration into thoracic cavity, initial encounter: Secondary | ICD-10-CM | POA: Diagnosis not present

## 2024-11-26 DIAGNOSIS — E039 Hypothyroidism, unspecified: Secondary | ICD-10-CM

## 2024-11-26 DIAGNOSIS — R918 Other nonspecific abnormal finding of lung field: Secondary | ICD-10-CM | POA: Diagnosis not present

## 2024-11-26 DIAGNOSIS — J9622 Acute and chronic respiratory failure with hypercapnia: Secondary | ICD-10-CM | POA: Diagnosis not present

## 2024-11-26 DIAGNOSIS — E43 Unspecified severe protein-calorie malnutrition: Secondary | ICD-10-CM

## 2024-11-26 MED ADMIN — THIAMINE 500MG IN NORMAL SALINE (50ML) IVPB: 500 mg | INTRAVENOUS | NDC 00641622825

## 2024-11-26 MED ADMIN — THIAMINE 500MG IN NORMAL SALINE (50ML) IVPB: 500 mg | INTRAVENOUS | NDC 63323001302

## 2024-11-26 MED ADMIN — Fentanyl Citrate-NaCl 0.9% IV Soln 2.5 MG/250ML: 175 ug/h | INTRAVENOUS | NDC 70004020240

## 2024-11-26 MED ADMIN — Fentanyl Citrate-NaCl 0.9% IV Soln 2.5 MG/250ML: 125 ug/h | INTRAVENOUS | NDC 73177010225

## 2024-11-26 MED ADMIN — Clonazepam Tab 1 MG: 1 mg | NDC 43547040711

## 2024-11-26 MED ADMIN — Fentanyl Citrate Preservative Free (PF) Inj 100 MCG/2ML: 50 ug | INTRAVENOUS | NDC 73177010225

## 2024-11-26 MED ADMIN — Fentanyl Citrate Preservative Free (PF) Inj 100 MCG/2ML: 100 ug | INTRAVENOUS

## 2024-11-26 MED ADMIN — Fentanyl Citrate Preservative Free (PF) Inj 100 MCG/2ML: 50 ug | INTRAVENOUS

## 2024-11-26 MED ADMIN — Methylprednisolone Sod Succ For Inj 40 MG (Base Equiv): 40 mg | INTRAVENOUS | NDC 00009003930

## 2024-11-26 MED ADMIN — Piperacillin Sod-Tazobactam Sod in Dex IV Sol 3-0.375GM/50ML: 3.375 g | INTRAVENOUS | NDC 00338963601

## 2024-11-26 MED ADMIN — Dexmedetomidine HCl in NaCl 0.9% IV Soln 400 MCG/100ML: 0.4 ug/kg/h | INTRAVENOUS | NDC 00143952501

## 2024-11-26 MED ADMIN — Dexmedetomidine HCl in NaCl 0.9% IV Soln 400 MCG/100ML: 1 ug/kg/h | INTRAVENOUS | NDC 00143952501

## 2024-11-26 MED ADMIN — Dexmedetomidine HCl in NaCl 0.9% IV Soln 400 MCG/100ML: 1.1 ug/kg/h | INTRAVENOUS | NDC 00143952501

## 2024-11-26 MED ADMIN — Quetiapine Fumarate Tab 50 MG: 50 mg | NDC 67877024901

## 2024-11-26 MED ADMIN — Fentanyl Citrate PF Soln Prefilled Syringe 50 MCG/ML: 50 ug | INTRAVENOUS | NDC 63323080801

## 2024-11-26 MED ADMIN — Quetiapine Fumarate Tab 100 MG: 100 mg | NDC 29300014901

## 2024-11-26 MED ADMIN — Nutritional Supplement Liquid: 1000 mL | NDC 7007462716

## 2024-11-26 MED ADMIN — Enoxaparin Sodium Inj Soln Pref Syr 40 MG/0.4ML: 40 mg | SUBCUTANEOUS | NDC 00781324602

## 2024-11-26 MED ADMIN — Propofol IV Emul 1000 MG/100ML (10 MG/ML): 30 ug/kg/min | INTRAVENOUS | NDC 00069024801

## 2024-11-26 MED ADMIN — Propofol IV Emul 1000 MG/100ML (10 MG/ML): 20 ug/kg/min | INTRAVENOUS | NDC 00069024801

## 2024-11-27 ENCOUNTER — Encounter (HOSPITAL_COMMUNITY): Payer: Self-pay

## 2024-11-27 DIAGNOSIS — J441 Chronic obstructive pulmonary disease with (acute) exacerbation: Secondary | ICD-10-CM | POA: Diagnosis not present

## 2024-11-27 DIAGNOSIS — J9601 Acute respiratory failure with hypoxia: Secondary | ICD-10-CM | POA: Diagnosis not present

## 2024-11-27 DIAGNOSIS — E43 Unspecified severe protein-calorie malnutrition: Secondary | ICD-10-CM | POA: Insufficient documentation

## 2024-11-27 DIAGNOSIS — J15211 Pneumonia due to Methicillin susceptible Staphylococcus aureus: Secondary | ICD-10-CM | POA: Diagnosis not present

## 2024-11-27 DIAGNOSIS — G9341 Metabolic encephalopathy: Secondary | ICD-10-CM | POA: Diagnosis not present

## 2024-11-27 DIAGNOSIS — J9622 Acute and chronic respiratory failure with hypercapnia: Secondary | ICD-10-CM | POA: Diagnosis not present

## 2024-11-27 DIAGNOSIS — Z7189 Other specified counseling: Secondary | ICD-10-CM | POA: Diagnosis not present

## 2024-11-27 DIAGNOSIS — J479 Bronchiectasis, uncomplicated: Secondary | ICD-10-CM

## 2024-11-27 DIAGNOSIS — J439 Emphysema, unspecified: Secondary | ICD-10-CM | POA: Diagnosis not present

## 2024-11-27 DIAGNOSIS — B488 Other specified mycoses: Secondary | ICD-10-CM | POA: Diagnosis not present

## 2024-11-27 DIAGNOSIS — Z515 Encounter for palliative care: Secondary | ICD-10-CM | POA: Diagnosis not present

## 2024-11-27 DIAGNOSIS — D696 Thrombocytopenia, unspecified: Secondary | ICD-10-CM | POA: Diagnosis not present

## 2024-11-27 DIAGNOSIS — E039 Hypothyroidism, unspecified: Secondary | ICD-10-CM | POA: Diagnosis not present

## 2024-11-27 DIAGNOSIS — E46 Unspecified protein-calorie malnutrition: Secondary | ICD-10-CM | POA: Diagnosis not present

## 2024-11-27 DIAGNOSIS — J9621 Acute and chronic respiratory failure with hypoxia: Secondary | ICD-10-CM | POA: Diagnosis not present

## 2024-11-27 DIAGNOSIS — R739 Hyperglycemia, unspecified: Secondary | ICD-10-CM | POA: Diagnosis not present

## 2024-11-27 DIAGNOSIS — J984 Other disorders of lung: Secondary | ICD-10-CM | POA: Diagnosis not present

## 2024-11-27 MED ADMIN — THIAMINE 500MG IN NORMAL SALINE (50ML) IVPB: 500 mg | INTRAVENOUS | NDC 00641622801

## 2024-11-27 MED ADMIN — THIAMINE 500MG IN NORMAL SALINE (50ML) IVPB: 500 mg | INTRAVENOUS | NDC 00641622825

## 2024-11-27 MED ADMIN — Fentanyl Citrate-NaCl 0.9% IV Soln 2.5 MG/250ML: 225 ug/h | INTRAVENOUS | NDC 70004020240

## 2024-11-27 MED ADMIN — Fentanyl Citrate-NaCl 0.9% IV Soln 2.5 MG/250ML: 175 ug/h | INTRAVENOUS | NDC 70004020240

## 2024-11-27 MED ADMIN — Clonazepam Tab 1 MG: 1 mg | NDC 43547040711

## 2024-11-27 MED ADMIN — Fentanyl Citrate Preservative Free (PF) Inj 100 MCG/2ML: 100 ug | INTRAVENOUS

## 2024-11-27 MED ADMIN — Fentanyl Citrate Preservative Free (PF) Inj 100 MCG/2ML: 50 ug | INTRAVENOUS | NDC 70004020240

## 2024-11-27 MED ADMIN — Sodium Chloride Soln Nebu 3%: 4 mL | RESPIRATORY_TRACT | NDC 00487900360

## 2024-11-27 MED ADMIN — Methylprednisolone Sod Succ For Inj 40 MG (Base Equiv): 40 mg | INTRAVENOUS | NDC 00009003930

## 2024-11-27 MED ADMIN — Piperacillin Sod-Tazobactam Sod in Dex IV Sol 3-0.375GM/50ML: 3.375 g | INTRAVENOUS | NDC 00338963601

## 2024-11-27 MED ADMIN — Dexmedetomidine HCl in NaCl 0.9% IV Soln 400 MCG/100ML: 1 ug/kg/h | INTRAVENOUS | NDC 00143952501

## 2024-11-27 MED ADMIN — Dexmedetomidine HCl in NaCl 0.9% IV Soln 400 MCG/100ML: 1.1 ug/kg/h | INTRAVENOUS | NDC 00143952501

## 2024-11-27 MED ADMIN — Quetiapine Fumarate Tab 50 MG: 50 mg | NDC 67877024901

## 2024-11-27 MED ADMIN — Quetiapine Fumarate Tab 100 MG: 100 mg | NDC 29300014901

## 2024-11-27 MED ADMIN — Nutritional Supplement Liquid: 1000 mL | NDC 7007462716

## 2024-11-27 MED ADMIN — Cefazolin Sodium-Dextrose IV Solution 2 GM/100ML-4%: 2 g | INTRAVENOUS | NDC 00338350841

## 2024-11-27 MED ADMIN — Oxycodone HCl Tab 5 MG: 5 mg | NDC 68084035411

## 2024-11-27 MED ADMIN — Furosemide Inj 10 MG/ML: 60 mg | INTRAVENOUS | NDC 71288020302

## 2024-11-28 DIAGNOSIS — J439 Emphysema, unspecified: Secondary | ICD-10-CM | POA: Diagnosis not present

## 2024-11-28 DIAGNOSIS — E43 Unspecified severe protein-calorie malnutrition: Secondary | ICD-10-CM | POA: Diagnosis not present

## 2024-11-28 DIAGNOSIS — Z515 Encounter for palliative care: Secondary | ICD-10-CM | POA: Diagnosis not present

## 2024-11-28 DIAGNOSIS — J9622 Acute and chronic respiratory failure with hypercapnia: Secondary | ICD-10-CM | POA: Diagnosis not present

## 2024-11-28 DIAGNOSIS — J15211 Pneumonia due to Methicillin susceptible Staphylococcus aureus: Secondary | ICD-10-CM | POA: Diagnosis not present

## 2024-11-28 DIAGNOSIS — Z7189 Other specified counseling: Secondary | ICD-10-CM | POA: Diagnosis not present

## 2024-11-28 DIAGNOSIS — J9621 Acute and chronic respiratory failure with hypoxia: Secondary | ICD-10-CM | POA: Diagnosis not present

## 2024-11-28 DIAGNOSIS — J9601 Acute respiratory failure with hypoxia: Secondary | ICD-10-CM | POA: Diagnosis not present

## 2024-11-28 MED ADMIN — THIAMINE 500MG IN NORMAL SALINE (50ML) IVPB: 500 mg | INTRAVENOUS | NDC 00641622825

## 2024-11-28 MED ADMIN — THIAMINE 500MG IN NORMAL SALINE (50ML) IVPB: 500 mg | INTRAVENOUS | NDC 00641622801

## 2024-11-28 MED ADMIN — Fentanyl Citrate-NaCl 0.9% IV Soln 2.5 MG/250ML: 225 ug/h | INTRAVENOUS | NDC 70004020240

## 2024-11-28 MED ADMIN — Fentanyl Citrate-NaCl 0.9% IV Soln 2.5 MG/250ML: 200 ug/h | INTRAVENOUS | NDC 73177010225

## 2024-11-28 MED ADMIN — Clonazepam Tab 1 MG: 1 mg | NDC 43547040711

## 2024-11-28 MED ADMIN — Fentanyl Citrate Preservative Free (PF) Inj 100 MCG/2ML: 150 ug | INTRAVENOUS

## 2024-11-28 MED ADMIN — Fentanyl Citrate Preservative Free (PF) Inj 100 MCG/2ML: 200 ug | INTRAVENOUS

## 2024-11-28 MED ADMIN — Sodium Chloride Soln Nebu 3%: 4 mL | RESPIRATORY_TRACT | NDC 00487900360

## 2024-11-28 MED ADMIN — Dexmedetomidine HCl in NaCl 0.9% IV Soln 400 MCG/100ML: 1.1 ug/kg/h | INTRAVENOUS | NDC 00143952501

## 2024-11-28 MED ADMIN — Dexmedetomidine HCl in NaCl 0.9% IV Soln 400 MCG/100ML: 1.2 ug/kg/h | INTRAVENOUS | NDC 00338955712

## 2024-11-28 MED ADMIN — Quetiapine Fumarate Tab 50 MG: 50 mg | NDC 67877024901

## 2024-11-28 MED ADMIN — Piperacillin Sod-Tazobactam Sod in Dex IV Sol 3-0.375GM/50ML: 3.375 g | INTRAVENOUS | NDC 00338963601

## 2024-11-28 MED ADMIN — Quetiapine Fumarate Tab 100 MG: 100 mg | NDC 29300014901

## 2024-11-28 MED ADMIN — Cefazolin Sodium-Dextrose IV Solution 2 GM/100ML-4%: 2 g | INTRAVENOUS | NDC 00338350841

## 2024-11-28 MED ADMIN — Oxycodone HCl Tab 5 MG: 5 mg | NDC 68084035411

## 2024-11-28 MED ADMIN — Prednisone Tab 20 MG: 10 mg | NDC 59651048801

## 2024-11-29 DIAGNOSIS — J439 Emphysema, unspecified: Secondary | ICD-10-CM | POA: Diagnosis not present

## 2024-11-29 DIAGNOSIS — J15211 Pneumonia due to Methicillin susceptible Staphylococcus aureus: Secondary | ICD-10-CM | POA: Diagnosis not present

## 2024-11-29 DIAGNOSIS — J9622 Acute and chronic respiratory failure with hypercapnia: Secondary | ICD-10-CM | POA: Diagnosis not present

## 2024-11-29 DIAGNOSIS — J9621 Acute and chronic respiratory failure with hypoxia: Secondary | ICD-10-CM | POA: Diagnosis not present

## 2024-11-29 MED ADMIN — Fentanyl Citrate-NaCl 0.9% IV Soln 2.5 MG/250ML: 200 ug/h | INTRAVENOUS | NDC 70004020240

## 2024-11-29 MED ADMIN — Fentanyl Citrate-NaCl 0.9% IV Soln 2.5 MG/250ML: 250 ug/h | INTRAVENOUS | NDC 70004020240

## 2024-11-29 MED ADMIN — Fentanyl Citrate Preservative Free (PF) Inj 100 MCG/2ML: 150 ug | INTRAVENOUS | NDC 70004020240

## 2024-11-29 MED ADMIN — Fentanyl Citrate Preservative Free (PF) Inj 100 MCG/2ML: 150 ug | INTRAVENOUS

## 2024-11-29 MED ADMIN — Fentanyl Citrate Preservative Free (PF) Inj 100 MCG/2ML: 100 ug | INTRAVENOUS

## 2024-11-29 MED ADMIN — Fentanyl Citrate Preservative Free (PF) Inj 100 MCG/2ML: 100 ug | INTRAVENOUS | NDC 70004020240

## 2024-11-29 MED ADMIN — Sodium Chloride Soln Nebu 3%: 4 mL | RESPIRATORY_TRACT | NDC 00487900360

## 2024-11-29 MED ADMIN — Dexmedetomidine HCl in NaCl 0.9% IV Soln 400 MCG/100ML: 1.2 ug/kg/h | INTRAVENOUS | NDC 00338955712

## 2024-11-29 MED ADMIN — Dexmedetomidine HCl in NaCl 0.9% IV Soln 400 MCG/100ML: 1.2 ug/kg/h | INTRAVENOUS | NDC 00143952501

## 2024-11-29 MED ADMIN — Midazolam HCl Inj PF 2 MG/2ML (Base Equivalent): 2 mg | INTRAVENOUS | NDC 00409000101

## 2024-11-29 MED ADMIN — Quetiapine Fumarate Tab 50 MG: 50 mg | NDC 67877024901

## 2024-11-29 MED ADMIN — Piperacillin Sod-Tazobactam Sod in Dex IV Sol 3-0.375GM/50ML: 3.375 g | INTRAVENOUS | NDC 00338963601

## 2024-11-29 MED ADMIN — Quetiapine Fumarate Tab 100 MG: 100 mg | NDC 29300014901

## 2024-11-29 MED ADMIN — Nutritional Supplement Liquid: 1000 mL | NDC 7007462716

## 2024-11-29 MED ADMIN — Clonazepam Tab 1 MG: 1 mg | NDC 43547040711

## 2024-11-29 MED ADMIN — Furosemide Inj 10 MG/ML: 80 mg | INTRAVENOUS | NDC 25021032004

## 2024-11-29 MED ADMIN — Enoxaparin Sodium Inj Soln Pref Syr 40 MG/0.4ML: 40 mg | SUBCUTANEOUS | NDC 00781324602

## 2024-11-29 MED ADMIN — Prednisone Tab 20 MG: 10 mg | NDC 60687014511

## 2024-11-30 DIAGNOSIS — G9341 Metabolic encephalopathy: Secondary | ICD-10-CM

## 2024-11-30 DIAGNOSIS — J15211 Pneumonia due to Methicillin susceptible Staphylococcus aureus: Secondary | ICD-10-CM

## 2024-11-30 DIAGNOSIS — J441 Chronic obstructive pulmonary disease with (acute) exacerbation: Secondary | ICD-10-CM | POA: Diagnosis not present

## 2024-11-30 DIAGNOSIS — Z01810 Encounter for preprocedural cardiovascular examination: Secondary | ICD-10-CM

## 2024-11-30 DIAGNOSIS — D649 Anemia, unspecified: Secondary | ICD-10-CM

## 2024-11-30 DIAGNOSIS — R739 Hyperglycemia, unspecified: Secondary | ICD-10-CM

## 2024-11-30 DIAGNOSIS — D696 Thrombocytopenia, unspecified: Secondary | ICD-10-CM

## 2024-11-30 DIAGNOSIS — I959 Hypotension, unspecified: Secondary | ICD-10-CM | POA: Diagnosis not present

## 2024-11-30 MED ADMIN — Fentanyl Citrate-NaCl 0.9% IV Soln 2.5 MG/250ML: 150 ug/h | INTRAVENOUS | NDC 70004020240

## 2024-11-30 MED ADMIN — Fentanyl Citrate-NaCl 0.9% IV Soln 2.5 MG/250ML: 100 ug/h | INTRAVENOUS | NDC 70004020240

## 2024-11-30 MED ADMIN — Hydromorphone HCl Inj 1 MG/ML: 0.5 mg | INTRAVENOUS

## 2024-11-30 MED ADMIN — Fentanyl Citrate Preservative Free (PF) Inj 100 MCG/2ML: 200 ug | INTRAVENOUS

## 2024-11-30 MED ADMIN — Fentanyl Citrate Preservative Free (PF) Inj 100 MCG/2ML: 150 ug | INTRAVENOUS

## 2024-11-30 MED ADMIN — Fentanyl Citrate Preservative Free (PF) Inj 100 MCG/2ML: 100 ug | INTRAVENOUS

## 2024-11-30 MED ADMIN — Fentanyl Citrate Preservative Free (PF) Inj 100 MCG/2ML: 100 ug | INTRAVENOUS | NDC 70004020240

## 2024-11-30 MED ADMIN — Sodium Chloride Soln Nebu 3%: 4 mL | RESPIRATORY_TRACT | NDC 00487900360

## 2024-11-30 MED ADMIN — Hydromorphone HCl-Sodium Chloride 0.9% IV Soln 50 MG/50ML: 1 mg/h | INTRAVENOUS | NDC 70092154635

## 2024-11-30 MED ADMIN — Sodium Zirconium Cyclosilicate For Susp Packet 5 GM: 5 g | NDC 00310110501

## 2024-11-30 MED ADMIN — Dexmedetomidine HCl in NaCl 0.9% IV Soln 400 MCG/100ML: 1.2 ug/kg/h | INTRAVENOUS | NDC 00338955712

## 2024-11-30 MED ADMIN — Midazolam HCl Inj PF 2 MG/2ML (Base Equivalent): 2 mg | INTRAVENOUS | NDC 00409000101

## 2024-11-30 MED ADMIN — Quetiapine Fumarate Tab 50 MG: 50 mg | NDC 67877024901

## 2024-11-30 MED ADMIN — Potassium Chloride Powder Packet 20 mEq: 40 meq | ORAL | NDC 00245036089

## 2024-11-30 MED ADMIN — Piperacillin Sod-Tazobactam Sod in Dex IV Sol 3-0.375GM/50ML: 3.375 g | INTRAVENOUS | NDC 00338963601

## 2024-11-30 MED ADMIN — Norepinephrine-Dextrose IV Solution 4 MG/250ML-5%: 4 ug/min | INTRAVENOUS | NDC 00338011220

## 2024-11-30 MED ADMIN — Quetiapine Fumarate Tab 100 MG: 100 mg | NDC 29300014901

## 2024-11-30 MED ADMIN — Calcium Gluconate-NaCl IV Soln 2 GM/100ML-0.675% (20 MG/ML): 2000 mg | INTRAVENOUS | NDC 44567062101

## 2024-11-30 MED ADMIN — Linezolid IV Soln 600 MG/300ML (2 MG/ML): 600 mg | INTRAVENOUS | NDC 63323071303

## 2024-11-30 MED ADMIN — Linezolid IV Soln 600 MG/300ML (2 MG/ML): 600 mg | INTRAVENOUS | NDC 57664068331

## 2024-11-30 MED ADMIN — DOXYCYCLINE 100 MG IVPB: 100 mg | INTRAVENOUS | NDC 68382091001

## 2024-11-30 MED ADMIN — DOXYCYCLINE 100 MG IVPB: 100 mg | INTRAVENOUS | NDC 71288003021

## 2024-11-30 MED ADMIN — Sodium Bicarbonate IV Soln 8.4%: 50 meq | INTRAVENOUS | NDC 76329335201

## 2024-11-30 MED ADMIN — Clonazepam Tab 1 MG: 1 mg | NDC 43547040711

## 2024-11-30 MED ADMIN — Enoxaparin Sodium Inj Soln Pref Syr 40 MG/0.4ML: 40 mg | SUBCUTANEOUS | NDC 00781324602

## 2024-11-30 MED ADMIN — Prednisone Tab 20 MG: 10 mg | NDC 60687014511

## 2024-11-30 MED ADMIN — Potassium Chloride Powder Packet 20 mEq: 40 meq | NDC 00245036089

## 2024-11-30 MED ADMIN — Oxycodone HCl Tab 5 MG: 5 mg | NDC 68084035411

## 2024-11-30 MED ADMIN — Norepinephrine-Dextrose IV Solution 4 MG/250ML-5%: 2 ug/min | INTRAVENOUS | NDC 00338011220

## 2024-11-30 MED ADMIN — Hydromorphone HCl Inj 1 MG/ML: 1 mg | INTRAVENOUS | NDC 76045000901

## 2024-11-30 MED ADMIN — Magnesium Sulfate IV Soln 2 GM/50ML (40 MG/ML): 2 g | INTRAVENOUS | NDC 00409672911

## 2024-12-01 DIAGNOSIS — J984 Other disorders of lung: Secondary | ICD-10-CM

## 2024-12-01 DIAGNOSIS — J9601 Acute respiratory failure with hypoxia: Secondary | ICD-10-CM | POA: Diagnosis not present

## 2024-12-01 DIAGNOSIS — J441 Chronic obstructive pulmonary disease with (acute) exacerbation: Secondary | ICD-10-CM | POA: Diagnosis not present

## 2024-12-01 DIAGNOSIS — G9341 Metabolic encephalopathy: Secondary | ICD-10-CM | POA: Diagnosis not present

## 2024-12-01 DIAGNOSIS — Z515 Encounter for palliative care: Secondary | ICD-10-CM | POA: Diagnosis not present

## 2024-12-01 DIAGNOSIS — I959 Hypotension, unspecified: Secondary | ICD-10-CM | POA: Diagnosis not present

## 2024-12-01 DIAGNOSIS — E43 Unspecified severe protein-calorie malnutrition: Secondary | ICD-10-CM | POA: Diagnosis not present

## 2024-12-01 DIAGNOSIS — J15211 Pneumonia due to Methicillin susceptible Staphylococcus aureus: Secondary | ICD-10-CM | POA: Diagnosis not present

## 2024-12-01 HISTORY — PX: ORGAN PROCUREMENT: SHX5270

## 2024-12-01 MED ADMIN — Sodium Chloride Irrigation Soln 0.9%: 1000 mL | NDC 99999050048

## 2024-12-01 MED ADMIN — Hydromorphone HCl Inj 1 MG/ML: 2 mg | INTRAVENOUS

## 2024-12-01 MED ADMIN — Hydromorphone HCl Inj 1 MG/ML: 1 mg | INTRAVENOUS

## 2024-12-01 MED ADMIN — Hydromorphone HCl Inj 1 MG/ML: 1.5 mg | INTRAVENOUS

## 2024-12-01 MED ADMIN — Hydromorphone HCl-Sodium Chloride 0.9% IV Soln 50 MG/50ML: 2 mg/h | INTRAVENOUS | NDC 70092154635

## 2024-12-01 MED ADMIN — Dexmedetomidine HCl in NaCl 0.9% IV Soln 400 MCG/100ML: 1.2 ug/kg/h | INTRAVENOUS | NDC 00338955712

## 2024-12-01 MED ADMIN — Quetiapine Fumarate Tab 50 MG: 50 mg | NDC 67877024901

## 2024-12-01 MED ADMIN — Piperacillin Sod-Tazobactam Sod in Dex IV Sol 3-0.375GM/50ML: 3.375 g | INTRAVENOUS | NDC 00338963601

## 2024-12-01 MED ADMIN — Midazolam HCl Inj 50 MG/10ML (Base Equivalent): 2 mg | INTRAVENOUS

## 2024-12-01 MED ADMIN — Heparin Sodium (Porcine) Inj 1000 Unit/ML: 30000 [IU] | INTRAVENOUS | NDC 72603017901

## 2024-12-01 MED ADMIN — Norepinephrine-Dextrose IV Solution 4 MG/250ML-5%: 15 ug/min | INTRAVENOUS | NDC 00338011220

## 2024-12-01 MED ADMIN — Norepinephrine-Dextrose IV Solution 4 MG/250ML-5%: 20 ug/min | INTRAVENOUS | NDC 00338011220

## 2024-12-01 MED ADMIN — Norepinephrine-Dextrose IV Solution 4 MG/250ML-5%: 9 ug/min | INTRAVENOUS | NDC 00338011220

## 2024-12-01 MED ADMIN — Linezolid IV Soln 600 MG/300ML (2 MG/ML): 600 mg | INTRAVENOUS | NDC 66794021963

## 2024-12-01 MED ADMIN — DOXYCYCLINE 100 MG IVPB: 100 mg | INTRAVENOUS | NDC 71288003020

## 2024-12-01 MED ADMIN — Midazolam 100 MG/100ML-Sodium Chloride 0.9% IV Soln: 2 mg/h | INTRAVENOUS | NDC 00143938001

## 2024-12-01 MED ADMIN — Glycopyrrolate Inj 0.2 MG/ML: 0.2 mg | INTRAVENOUS | NDC 00143968201

## 2024-12-01 MED ADMIN — Prednisone Tab 20 MG: 10 mg | NDC 60687014511

## 2024-12-01 MED ADMIN — Midazolam 100 MG/100ML-Sodium Chloride 0.9% IV Soln: 7.5 mg/h | INTRAVENOUS | NDC 00143938001

## 2024-12-01 MED ADMIN — Oxycodone HCl Tab 5 MG: 5 mg | NDC 68084035411

## 2024-12-01 MED ADMIN — Midazolam HCl Inj 50 MG/10ML (Base Equivalent): 3 mg | INTRAVENOUS

## 2024-12-01 MED ADMIN — Hydromorphone HCl-Sodium Chloride 0.9% IV Soln 50 MG/50ML: 8.5 mg/h | INTRAVENOUS | NDC 70092154635

## 2024-12-01 MED ADMIN — Hydromorphone HCl-Sodium Chloride 0.9% IV Soln 50 MG/50ML: 4.5 mg/h | INTRAVENOUS | NDC 70092154635

## 2024-12-02 ENCOUNTER — Encounter (HOSPITAL_COMMUNITY): Payer: Self-pay

## 2024-12-02 LAB — SEROTONIN RELEASE ASSAY (SRA)
SRA .2 IU/mL UFH Ser-aCnc: <1 % (ref 0–20)
SRA 100IU/mL UFH Ser-aCnc: <1 % (ref 0–20)

## 2024-12-02 LAB — CALCIUM, IONIZED
Calcium, Ionized, Serum: 4.4 mg/dL — ABNORMAL LOW (ref 4.5–5.6)
Calcium, Ionized, Serum: 4.8 mg/dL (ref 4.5–5.6)
Calcium, Ionized, Serum: 4.3 mg/dL — ABNORMAL LOW (ref 4.5–5.6)
Calcium, Ionized, Serum: 4.8 mg/dL (ref 4.5–5.6)

## 2024-12-02 LAB — URINE CULTURE: Culture: NO GROWTH

## 2024-12-03 LAB — SURGICAL PATHOLOGY

## 2024-12-05 LAB — CULTURE, BLOOD (ROUTINE X 2)
Culture: NO GROWTH
Culture: NO GROWTH
Special Requests: ADEQUATE

## 2024-12-08 LAB — FUNGUS CULTURE WITH STAIN

## 2024-12-08 LAB — FUNGUS CULTURE RESULT

## 2024-12-10 ENCOUNTER — Other Ambulatory Visit: Payer: Self-pay

## 2024-12-10 ENCOUNTER — Other Ambulatory Visit: Payer: Self-pay | Admitting: Pharmacy Technician

## 2024-12-10 NOTE — Progress Notes (Signed)
 Patient passed away on 2024/12/07. Opened chart to dis-enroll from specialty pharmacy.

## 2024-12-16 ENCOUNTER — Other Ambulatory Visit: Payer: Self-pay

## 2024-12-16 ENCOUNTER — Other Ambulatory Visit (HOSPITAL_COMMUNITY): Payer: Self-pay

## 2024-12-16 NOTE — Progress Notes (Signed)
 Patient is deceased per Epic & WAM. Disenrolling patient from [facility].

## 2024-12-17 ENCOUNTER — Ambulatory Visit: Admitting: Infectious Disease

## 2024-12-18 ENCOUNTER — Other Ambulatory Visit: Payer: Self-pay
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
## 2024-12-20 DEATH — deceased
